# Patient Record
Sex: Female | Born: 1940 | Race: White | Hispanic: No | Marital: Married | Smoking: Former smoker
Health system: Southern US, Community
[De-identification: ages and names within clinical notes are randomized; demographics above are authoritative.]

## PROBLEM LIST (undated history)

## (undated) DIAGNOSIS — E785 Hyperlipidemia, unspecified: Secondary | ICD-10-CM

## (undated) DIAGNOSIS — J189 Pneumonia, unspecified organism: Secondary | ICD-10-CM

## (undated) DIAGNOSIS — I2699 Other pulmonary embolism without acute cor pulmonale: Secondary | ICD-10-CM

## (undated) DIAGNOSIS — M546 Pain in thoracic spine: Secondary | ICD-10-CM

## (undated) DIAGNOSIS — I739 Peripheral vascular disease, unspecified: Secondary | ICD-10-CM

## (undated) DIAGNOSIS — M479 Spondylosis, unspecified: Secondary | ICD-10-CM

## (undated) DIAGNOSIS — I10 Essential (primary) hypertension: Secondary | ICD-10-CM

## (undated) DIAGNOSIS — I679 Cerebrovascular disease, unspecified: Secondary | ICD-10-CM

## (undated) DIAGNOSIS — R42 Dizziness and giddiness: Secondary | ICD-10-CM

## (undated) DIAGNOSIS — R011 Cardiac murmur, unspecified: Secondary | ICD-10-CM

## (undated) DIAGNOSIS — I209 Angina pectoris, unspecified: Secondary | ICD-10-CM

## (undated) DIAGNOSIS — E039 Hypothyroidism, unspecified: Secondary | ICD-10-CM

## (undated) DIAGNOSIS — I499 Cardiac arrhythmia, unspecified: Secondary | ICD-10-CM

## (undated) DIAGNOSIS — Z0000 Encounter for general adult medical examination without abnormal findings: Secondary | ICD-10-CM

## (undated) DIAGNOSIS — I714 Abdominal aortic aneurysm, without rupture: Secondary | ICD-10-CM

## (undated) DIAGNOSIS — R001 Bradycardia, unspecified: Secondary | ICD-10-CM

## (undated) DIAGNOSIS — M7120 Synovial cyst of popliteal space [Baker], unspecified knee: Secondary | ICD-10-CM

## (undated) DIAGNOSIS — I200 Unstable angina: Secondary | ICD-10-CM

## (undated) DIAGNOSIS — G8929 Other chronic pain: Secondary | ICD-10-CM

## (undated) DIAGNOSIS — F32A Depression, unspecified: Secondary | ICD-10-CM

## (undated) DIAGNOSIS — J45909 Unspecified asthma, uncomplicated: Secondary | ICD-10-CM

## (undated) DIAGNOSIS — R430 Anosmia: Secondary | ICD-10-CM

## (undated) DIAGNOSIS — J209 Acute bronchitis, unspecified: Secondary | ICD-10-CM

## (undated) DIAGNOSIS — J449 Chronic obstructive pulmonary disease, unspecified: Secondary | ICD-10-CM

## (undated) DIAGNOSIS — R0602 Shortness of breath: Secondary | ICD-10-CM

## (undated) DIAGNOSIS — R739 Hyperglycemia, unspecified: Secondary | ICD-10-CM

## (undated) DIAGNOSIS — M5126 Other intervertebral disc displacement, lumbar region: Secondary | ICD-10-CM

## (undated) DIAGNOSIS — M47815 Spondylosis without myelopathy or radiculopathy, thoracolumbar region: Secondary | ICD-10-CM

## (undated) DIAGNOSIS — I2510 Atherosclerotic heart disease of native coronary artery without angina pectoris: Secondary | ICD-10-CM

## (undated) DIAGNOSIS — Z9289 Personal history of other medical treatment: Secondary | ICD-10-CM

## (undated) DIAGNOSIS — I214 Non-ST elevation (NSTEMI) myocardial infarction: Secondary | ICD-10-CM

## (undated) DIAGNOSIS — E079 Disorder of thyroid, unspecified: Secondary | ICD-10-CM

## (undated) DIAGNOSIS — C4491 Basal cell carcinoma of skin, unspecified: Secondary | ICD-10-CM

## (undated) DIAGNOSIS — F329 Major depressive disorder, single episode, unspecified: Secondary | ICD-10-CM

## (undated) DIAGNOSIS — K219 Gastro-esophageal reflux disease without esophagitis: Secondary | ICD-10-CM

## (undated) DIAGNOSIS — IMO0002 Reserved for concepts with insufficient information to code with codable children: Secondary | ICD-10-CM

## (undated) HISTORY — DX: Encounter for general adult medical examination without abnormal findings: Z00.00

## (undated) HISTORY — PX: BASAL CELL CARCINOMA EXCISION: SHX1214

## (undated) HISTORY — DX: Disorder of thyroid, unspecified: E07.9

## (undated) HISTORY — DX: Hyperlipidemia, unspecified: E78.5

## (undated) HISTORY — DX: Atherosclerotic heart disease of native coronary artery without angina pectoris: I25.10

## (undated) HISTORY — PX: DILATION AND CURETTAGE OF UTERUS: SHX78

## (undated) HISTORY — DX: Synovial cyst of popliteal space (Baker), unspecified knee: M71.20

## (undated) HISTORY — PX: FRACTURE SURGERY: SHX138

## (undated) HISTORY — DX: Major depressive disorder, single episode, unspecified: F32.9

## (undated) HISTORY — PX: NASAL SINUS SURGERY: SHX719

## (undated) HISTORY — DX: Essential (primary) hypertension: I10

## (undated) HISTORY — DX: Anosmia: R43.0

## (undated) HISTORY — DX: Acute bronchitis, unspecified: J20.9

## (undated) HISTORY — PX: FEMORAL ARTERY STENT: SHX1583

## (undated) HISTORY — DX: Hypothyroidism, unspecified: E03.9

## (undated) HISTORY — DX: Non-ST elevation (NSTEMI) myocardial infarction: I21.4

## (undated) HISTORY — DX: Peripheral vascular disease, unspecified: I73.9

## (undated) HISTORY — DX: Gastro-esophageal reflux disease without esophagitis: K21.9

## (undated) HISTORY — DX: Cerebrovascular disease, unspecified: I67.9

## (undated) HISTORY — DX: Depression, unspecified: F32.A

## (undated) HISTORY — DX: Chronic obstructive pulmonary disease, unspecified: J44.9

## (undated) HISTORY — PX: SQUAMOUS CELL CARCINOMA EXCISION: SHX2433

## (undated) HISTORY — PX: CATARACT EXTRACTION W/ INTRAOCULAR LENS  IMPLANT, BILATERAL: SHX1307

## (undated) HISTORY — PX: CARDIAC CATHETERIZATION: SHX172

## (undated) HISTORY — DX: Unstable angina: I20.0

## (undated) HISTORY — DX: Bradycardia, unspecified: R00.1

## (undated) HISTORY — DX: Unspecified asthma, uncomplicated: J45.909

## (undated) HISTORY — PX: ABDOMINAL AORTIC ANEURYSM REPAIR: SHX42

## (undated) HISTORY — DX: Dizziness and giddiness: R42

## (undated) HISTORY — PX: TUBAL LIGATION: SHX77

## (undated) HISTORY — DX: Hyperglycemia, unspecified: R73.9

## (undated) HISTORY — DX: Other intervertebral disc displacement, lumbar region: M51.26

## (undated) HISTORY — DX: Abdominal aortic aneurysm, without rupture: I71.4

---

## 1997-05-26 DIAGNOSIS — Z9289 Personal history of other medical treatment: Secondary | ICD-10-CM

## 1997-05-26 DIAGNOSIS — I2699 Other pulmonary embolism without acute cor pulmonale: Secondary | ICD-10-CM

## 1997-05-26 HISTORY — DX: Other pulmonary embolism without acute cor pulmonale: I26.99

## 1997-05-26 HISTORY — DX: Personal history of other medical treatment: Z92.89

## 1997-05-26 HISTORY — PX: CORONARY ARTERY BYPASS GRAFT: SHX141

## 2002-05-25 ENCOUNTER — Encounter: Payer: Self-pay | Admitting: Internal Medicine

## 2004-04-30 ENCOUNTER — Encounter: Admission: RE | Admit: 2004-04-30 | Discharge: 2004-04-30 | Payer: Self-pay | Admitting: Internal Medicine

## 2004-06-05 ENCOUNTER — Encounter: Admission: RE | Admit: 2004-06-05 | Discharge: 2004-06-05 | Payer: Self-pay | Admitting: Internal Medicine

## 2004-12-26 ENCOUNTER — Ambulatory Visit: Payer: Self-pay | Admitting: Cardiology

## 2005-01-06 ENCOUNTER — Ambulatory Visit: Payer: Self-pay

## 2005-04-22 ENCOUNTER — Ambulatory Visit: Payer: Self-pay | Admitting: Internal Medicine

## 2005-04-30 ENCOUNTER — Ambulatory Visit: Payer: Self-pay | Admitting: Family Medicine

## 2005-06-30 ENCOUNTER — Ambulatory Visit: Payer: Self-pay | Admitting: Internal Medicine

## 2005-06-30 ENCOUNTER — Ambulatory Visit: Payer: Self-pay | Admitting: Cardiology

## 2005-07-10 ENCOUNTER — Ambulatory Visit: Payer: Self-pay | Admitting: Pulmonary Disease

## 2005-07-11 ENCOUNTER — Ambulatory Visit (HOSPITAL_COMMUNITY): Admission: RE | Admit: 2005-07-11 | Discharge: 2005-07-11 | Payer: Self-pay | Admitting: Internal Medicine

## 2005-07-29 ENCOUNTER — Ambulatory Visit: Payer: Self-pay | Admitting: Internal Medicine

## 2005-09-01 ENCOUNTER — Ambulatory Visit: Payer: Self-pay | Admitting: Cardiology

## 2005-09-03 ENCOUNTER — Ambulatory Visit: Payer: Self-pay | Admitting: Internal Medicine

## 2005-09-12 ENCOUNTER — Ambulatory Visit: Payer: Self-pay | Admitting: Cardiology

## 2005-09-19 ENCOUNTER — Encounter: Admission: RE | Admit: 2005-09-19 | Discharge: 2005-09-19 | Payer: Self-pay | Admitting: Internal Medicine

## 2005-10-03 ENCOUNTER — Ambulatory Visit: Payer: Self-pay | Admitting: Internal Medicine

## 2005-12-04 ENCOUNTER — Ambulatory Visit: Payer: Self-pay | Admitting: Internal Medicine

## 2006-01-20 ENCOUNTER — Ambulatory Visit: Payer: Self-pay | Admitting: Internal Medicine

## 2006-03-04 ENCOUNTER — Ambulatory Visit: Payer: Self-pay | Admitting: Internal Medicine

## 2006-03-11 ENCOUNTER — Ambulatory Visit: Payer: Self-pay

## 2006-03-18 ENCOUNTER — Ambulatory Visit: Payer: Self-pay | Admitting: Cardiology

## 2006-03-20 ENCOUNTER — Ambulatory Visit: Payer: Self-pay | Admitting: Cardiology

## 2006-03-25 ENCOUNTER — Ambulatory Visit: Payer: Self-pay | Admitting: Cardiology

## 2006-04-02 ENCOUNTER — Ambulatory Visit: Payer: Self-pay | Admitting: Cardiology

## 2006-04-08 ENCOUNTER — Ambulatory Visit: Payer: Self-pay | Admitting: Cardiology

## 2006-04-30 ENCOUNTER — Ambulatory Visit: Payer: Self-pay | Admitting: Cardiology

## 2006-05-04 ENCOUNTER — Ambulatory Visit: Payer: Self-pay | Admitting: Cardiology

## 2006-05-12 ENCOUNTER — Ambulatory Visit: Payer: Self-pay | Admitting: Internal Medicine

## 2006-05-15 ENCOUNTER — Ambulatory Visit: Payer: Self-pay

## 2006-06-03 ENCOUNTER — Ambulatory Visit: Payer: Self-pay | Admitting: Cardiology

## 2006-06-03 ENCOUNTER — Ambulatory Visit: Payer: Self-pay | Admitting: Internal Medicine

## 2006-06-03 LAB — CONVERTED CEMR LAB
BUN: 15 mg/dL (ref 6–23)
CO2: 31 meq/L (ref 19–32)
Calcium: 9.9 mg/dL (ref 8.4–10.5)
Chloride: 100 meq/L (ref 96–112)
Creatinine, Ser: 1 mg/dL (ref 0.4–1.2)
GFR calc non Af Amer: 59 mL/min
Glomerular Filtration Rate, Af Am: 71 mL/min/{1.73_m2}
Glucose, Bld: 88 mg/dL (ref 70–99)
HCT: 41 % (ref 36.0–46.0)
Hemoglobin: 14.1 g/dL (ref 12.0–15.0)
INR: 1 (ref 0.9–2.0)
MCHC: 34.4 g/dL (ref 30.0–36.0)
MCV: 84.2 fL (ref 78.0–100.0)
Platelets: 188 10*3/uL (ref 150–400)
Potassium: 4.1 meq/L (ref 3.5–5.1)
Prothrombin Time: 12.3 s (ref 10.0–14.0)
RBC: 4.87 M/uL (ref 3.87–5.11)
RDW: 13.4 % (ref 11.5–14.6)
Sodium: 140 meq/L (ref 135–145)
WBC: 7.1 10*3/uL (ref 4.5–10.5)
aPTT: 33 s (ref 26.5–36.5)

## 2006-06-05 ENCOUNTER — Ambulatory Visit: Payer: Self-pay | Admitting: Cardiology

## 2006-06-11 ENCOUNTER — Ambulatory Visit: Payer: Self-pay | Admitting: Cardiology

## 2006-06-12 ENCOUNTER — Ambulatory Visit: Admission: RE | Admit: 2006-06-12 | Discharge: 2006-06-12 | Payer: Self-pay | Admitting: Cardiology

## 2006-06-25 ENCOUNTER — Ambulatory Visit: Payer: Self-pay

## 2006-06-25 ENCOUNTER — Ambulatory Visit: Payer: Self-pay | Admitting: Cardiology

## 2006-06-25 LAB — CONVERTED CEMR LAB
BUN: 12 mg/dL (ref 6–23)
Basophils Absolute: 0 10*3/uL (ref 0.0–0.1)
Basophils Relative: 0.6 % (ref 0.0–1.0)
CO2: 32 meq/L (ref 19–32)
Calcium: 9.3 mg/dL (ref 8.4–10.5)
Chloride: 99 meq/L (ref 96–112)
Creatinine, Ser: 0.8 mg/dL (ref 0.4–1.2)
Eosinophils Absolute: 0.2 10*3/uL (ref 0.0–0.6)
Eosinophils Relative: 3.5 % (ref 0.0–5.0)
GFR calc Af Amer: 92 mL/min
GFR calc non Af Amer: 76 mL/min
Glucose, Bld: 85 mg/dL (ref 70–99)
HCT: 40.6 % (ref 36.0–46.0)
Hemoglobin: 13.7 g/dL (ref 12.0–15.0)
INR: 0.9 (ref 0.9–2.0)
Lymphocytes Relative: 25.9 % (ref 12.0–46.0)
MCHC: 33.7 g/dL (ref 30.0–36.0)
MCV: 85 fL (ref 78.0–100.0)
Monocytes Absolute: 0.4 10*3/uL (ref 0.2–0.7)
Monocytes Relative: 5.5 % (ref 3.0–11.0)
Neutro Abs: 4.4 10*3/uL (ref 1.4–7.7)
Neutrophils Relative %: 64.5 % (ref 43.0–77.0)
Platelets: 243 10*3/uL (ref 150–400)
Potassium: 3.7 meq/L (ref 3.5–5.1)
Prothrombin Time: 11.8 s (ref 10.0–14.0)
RBC: 4.77 M/uL (ref 3.87–5.11)
RDW: 13.2 % (ref 11.5–14.6)
Sodium: 138 meq/L (ref 135–145)
WBC: 6.8 10*3/uL (ref 4.5–10.5)
aPTT: 30.3 s (ref 26.5–36.5)

## 2006-06-30 ENCOUNTER — Ambulatory Visit: Payer: Self-pay | Admitting: Cardiology

## 2006-06-30 ENCOUNTER — Ambulatory Visit (HOSPITAL_COMMUNITY): Admission: RE | Admit: 2006-06-30 | Discharge: 2006-06-30 | Payer: Self-pay | Admitting: Cardiology

## 2006-07-08 ENCOUNTER — Ambulatory Visit: Payer: Self-pay | Admitting: Cardiology

## 2006-07-08 ENCOUNTER — Ambulatory Visit: Payer: Self-pay

## 2006-09-10 ENCOUNTER — Ambulatory Visit: Payer: Self-pay | Admitting: Cardiology

## 2006-09-10 LAB — CONVERTED CEMR LAB
ALT: 19 units/L (ref 0–40)
AST: 20 units/L (ref 0–37)
Albumin: 4.2 g/dL (ref 3.5–5.2)
Alkaline Phosphatase: 88 units/L (ref 39–117)
BUN: 20 mg/dL (ref 6–23)
Bilirubin, Direct: 0.1 mg/dL (ref 0.0–0.3)
CO2: 32 meq/L (ref 19–32)
Calcium: 9.8 mg/dL (ref 8.4–10.5)
Chloride: 101 meq/L (ref 96–112)
Cholesterol: 184 mg/dL (ref 0–200)
Creatinine, Ser: 0.9 mg/dL (ref 0.4–1.2)
Direct LDL: 116.7 mg/dL
GFR calc Af Amer: 80 mL/min
GFR calc non Af Amer: 66 mL/min
Glucose, Bld: 95 mg/dL (ref 70–99)
HDL: 34.6 mg/dL — ABNORMAL LOW (ref >39.0)
LDL Cholesterol: 99 mg/dL (ref 0–99)
Potassium: 3.9 meq/L (ref 3.5–5.1)
Sodium: 141 meq/L (ref 135–145)
Total Bilirubin: 0.8 mg/dL (ref 0.3–1.2)
Total CHOL/HDL Ratio: 5.3
Total Protein: 7.4 g/dL (ref 6.0–8.3)
Triglycerides: 251 mg/dL (ref 0–149)
VLDL: 50 mg/dL — ABNORMAL HIGH (ref 0–40)

## 2006-09-16 ENCOUNTER — Ambulatory Visit: Payer: Self-pay | Admitting: Internal Medicine

## 2006-09-16 LAB — CONVERTED CEMR LAB
Hgb A1c MFr Bld: 5.9 % (ref 4.6–6.0)
TSH: 2.58 microintl units/mL (ref 0.35–5.50)

## 2006-11-18 ENCOUNTER — Ambulatory Visit: Payer: Self-pay | Admitting: Internal Medicine

## 2006-12-02 ENCOUNTER — Ambulatory Visit: Payer: Self-pay | Admitting: Cardiology

## 2006-12-22 ENCOUNTER — Encounter: Payer: Self-pay | Admitting: Internal Medicine

## 2006-12-22 DIAGNOSIS — I739 Peripheral vascular disease, unspecified: Secondary | ICD-10-CM | POA: Insufficient documentation

## 2006-12-22 DIAGNOSIS — I10 Essential (primary) hypertension: Secondary | ICD-10-CM | POA: Insufficient documentation

## 2006-12-22 DIAGNOSIS — I2510 Atherosclerotic heart disease of native coronary artery without angina pectoris: Secondary | ICD-10-CM | POA: Insufficient documentation

## 2006-12-22 DIAGNOSIS — M810 Age-related osteoporosis without current pathological fracture: Secondary | ICD-10-CM | POA: Insufficient documentation

## 2006-12-22 DIAGNOSIS — F339 Major depressive disorder, recurrent, unspecified: Secondary | ICD-10-CM | POA: Insufficient documentation

## 2006-12-22 DIAGNOSIS — J449 Chronic obstructive pulmonary disease, unspecified: Secondary | ICD-10-CM | POA: Insufficient documentation

## 2006-12-22 DIAGNOSIS — E785 Hyperlipidemia, unspecified: Secondary | ICD-10-CM | POA: Insufficient documentation

## 2006-12-22 DIAGNOSIS — K219 Gastro-esophageal reflux disease without esophagitis: Secondary | ICD-10-CM | POA: Insufficient documentation

## 2006-12-22 DIAGNOSIS — F329 Major depressive disorder, single episode, unspecified: Secondary | ICD-10-CM

## 2007-01-07 ENCOUNTER — Ambulatory Visit: Payer: Self-pay

## 2007-01-20 ENCOUNTER — Ambulatory Visit: Payer: Self-pay | Admitting: Cardiology

## 2007-01-28 ENCOUNTER — Ambulatory Visit: Payer: Self-pay

## 2007-05-31 ENCOUNTER — Telehealth: Payer: Self-pay | Admitting: Internal Medicine

## 2007-06-18 ENCOUNTER — Telehealth: Payer: Self-pay | Admitting: Internal Medicine

## 2007-07-06 ENCOUNTER — Ambulatory Visit: Payer: Self-pay | Admitting: Cardiology

## 2007-07-23 ENCOUNTER — Ambulatory Visit: Payer: Self-pay | Admitting: Cardiology

## 2007-07-23 ENCOUNTER — Ambulatory Visit: Payer: Self-pay

## 2007-07-23 LAB — CONVERTED CEMR LAB
AST: 16 units/L (ref 0–37)
Albumin: 3.5 g/dL (ref 3.5–5.2)
Bilirubin, Direct: 0.1 mg/dL (ref 0.0–0.3)
Cholesterol: 156 mg/dL (ref 0–200)
GFR calc Af Amer: 71 mL/min
GFR calc non Af Amer: 59 mL/min
Glucose, Bld: 96 mg/dL (ref 70–99)
HDL: 26.2 mg/dL — ABNORMAL LOW (ref >39.0)
LDL Cholesterol: 100 mg/dL — ABNORMAL HIGH (ref 0–99)
Potassium: 4.2 meq/L (ref 3.5–5.1)
Sodium: 141 meq/L (ref 135–145)
Total CHOL/HDL Ratio: 6
Triglycerides: 147 mg/dL (ref 0–149)
VLDL: 29 mg/dL (ref 0–40)

## 2007-08-04 ENCOUNTER — Ambulatory Visit: Payer: Self-pay | Admitting: Internal Medicine

## 2007-08-04 DIAGNOSIS — R5383 Other fatigue: Secondary | ICD-10-CM | POA: Insufficient documentation

## 2007-08-04 DIAGNOSIS — R5381 Other malaise: Secondary | ICD-10-CM

## 2007-09-23 ENCOUNTER — Encounter: Payer: Self-pay | Admitting: Internal Medicine

## 2007-09-23 ENCOUNTER — Encounter: Payer: Self-pay | Admitting: Cardiology

## 2007-09-23 ENCOUNTER — Ambulatory Visit: Payer: Self-pay

## 2007-09-23 LAB — CONVERTED CEMR LAB
ALT: 22 units/L (ref 0–35)
Albumin: 4.6 g/dL (ref 3.5–5.2)
CO2: 24 meq/L (ref 19–32)
Cholesterol: 141 mg/dL (ref 0–200)
Glucose, Bld: 105 mg/dL — ABNORMAL HIGH (ref 70–99)
LDL Cholesterol: 69 mg/dL (ref 0–99)
Potassium: 4.2 meq/L (ref 3.5–5.3)
Sodium: 142 meq/L (ref 135–145)
Total Bilirubin: 0.4 mg/dL (ref 0.3–1.2)
Total Protein: 7.5 g/dL (ref 6.0–8.3)
VLDL: 32 mg/dL (ref 0–40)

## 2007-10-05 ENCOUNTER — Ambulatory Visit: Payer: Self-pay | Admitting: Internal Medicine

## 2007-10-13 ENCOUNTER — Ambulatory Visit: Payer: Self-pay | Admitting: Internal Medicine

## 2007-10-25 ENCOUNTER — Telehealth: Payer: Self-pay | Admitting: Internal Medicine

## 2007-11-04 ENCOUNTER — Ambulatory Visit: Payer: Self-pay | Admitting: Internal Medicine

## 2007-11-04 DIAGNOSIS — S022XXA Fracture of nasal bones, initial encounter for closed fracture: Secondary | ICD-10-CM | POA: Insufficient documentation

## 2007-11-06 ENCOUNTER — Emergency Department (HOSPITAL_COMMUNITY): Admission: EM | Admit: 2007-11-06 | Discharge: 2007-11-06 | Payer: Self-pay | Admitting: Emergency Medicine

## 2007-11-09 ENCOUNTER — Encounter: Payer: Self-pay | Admitting: Internal Medicine

## 2007-11-24 ENCOUNTER — Ambulatory Visit (HOSPITAL_COMMUNITY): Admission: RE | Admit: 2007-11-24 | Discharge: 2007-11-24 | Payer: Self-pay | Admitting: Otolaryngology

## 2007-11-24 HISTORY — PX: CLOSED REDUCTION NASAL FRACTURE: SHX5365

## 2007-12-03 ENCOUNTER — Ambulatory Visit (HOSPITAL_COMMUNITY): Admission: RE | Admit: 2007-12-03 | Discharge: 2007-12-03 | Payer: Self-pay | Admitting: Neurosurgery

## 2007-12-15 ENCOUNTER — Ambulatory Visit: Payer: Self-pay | Admitting: Internal Medicine

## 2007-12-15 LAB — HM COLONOSCOPY

## 2008-03-15 ENCOUNTER — Ambulatory Visit: Payer: Self-pay | Admitting: Cardiology

## 2008-03-15 ENCOUNTER — Ambulatory Visit: Payer: Self-pay

## 2008-05-29 ENCOUNTER — Telehealth (INDEPENDENT_AMBULATORY_CARE_PROVIDER_SITE_OTHER): Payer: Self-pay | Admitting: *Deleted

## 2008-06-28 ENCOUNTER — Telehealth: Payer: Self-pay | Admitting: Internal Medicine

## 2008-07-04 ENCOUNTER — Telehealth: Payer: Self-pay | Admitting: Internal Medicine

## 2008-07-05 ENCOUNTER — Ambulatory Visit: Payer: Self-pay | Admitting: Internal Medicine

## 2008-07-10 ENCOUNTER — Ambulatory Visit: Payer: Self-pay | Admitting: Internal Medicine

## 2008-07-10 ENCOUNTER — Telehealth: Payer: Self-pay | Admitting: Internal Medicine

## 2008-07-10 ENCOUNTER — Ambulatory Visit (HOSPITAL_BASED_OUTPATIENT_CLINIC_OR_DEPARTMENT_OTHER): Admission: RE | Admit: 2008-07-10 | Discharge: 2008-07-10 | Payer: Self-pay | Admitting: Internal Medicine

## 2008-07-10 ENCOUNTER — Ambulatory Visit: Payer: Self-pay | Admitting: Radiology

## 2008-07-10 DIAGNOSIS — R0789 Other chest pain: Secondary | ICD-10-CM | POA: Insufficient documentation

## 2008-07-19 ENCOUNTER — Ambulatory Visit: Payer: Self-pay | Admitting: Cardiology

## 2008-07-19 ENCOUNTER — Telehealth: Payer: Self-pay | Admitting: Internal Medicine

## 2008-07-27 ENCOUNTER — Ambulatory Visit: Payer: Self-pay

## 2008-07-27 ENCOUNTER — Encounter: Payer: Self-pay | Admitting: Internal Medicine

## 2008-07-27 ENCOUNTER — Ambulatory Visit: Payer: Self-pay | Admitting: Cardiology

## 2008-07-27 LAB — CONVERTED CEMR LAB
BUN: 12 mg/dL (ref 6–23)
Calcium: 9.6 mg/dL (ref 8.4–10.5)
Creatinine, Ser: 0.8 mg/dL (ref 0.4–1.2)
GFR calc Af Amer: 91 mL/min
GFR calc non Af Amer: 76 mL/min

## 2008-08-14 ENCOUNTER — Encounter: Payer: Self-pay | Admitting: Cardiology

## 2008-08-14 ENCOUNTER — Ambulatory Visit: Payer: Self-pay | Admitting: Cardiology

## 2008-08-24 ENCOUNTER — Encounter: Payer: Self-pay | Admitting: Internal Medicine

## 2008-10-30 ENCOUNTER — Ambulatory Visit: Payer: Self-pay | Admitting: Internal Medicine

## 2008-10-30 ENCOUNTER — Ambulatory Visit (HOSPITAL_BASED_OUTPATIENT_CLINIC_OR_DEPARTMENT_OTHER): Admission: RE | Admit: 2008-10-30 | Discharge: 2008-10-30 | Payer: Self-pay | Admitting: Internal Medicine

## 2008-10-30 ENCOUNTER — Ambulatory Visit: Payer: Self-pay | Admitting: Diagnostic Radiology

## 2009-01-02 ENCOUNTER — Encounter (INDEPENDENT_AMBULATORY_CARE_PROVIDER_SITE_OTHER): Payer: Self-pay | Admitting: *Deleted

## 2009-01-22 ENCOUNTER — Encounter: Payer: Self-pay | Admitting: Family Medicine

## 2009-01-24 ENCOUNTER — Ambulatory Visit: Payer: Self-pay | Admitting: Family Medicine

## 2009-01-24 ENCOUNTER — Ambulatory Visit: Payer: Self-pay | Admitting: Internal Medicine

## 2009-01-24 ENCOUNTER — Encounter: Payer: Self-pay | Admitting: Family Medicine

## 2009-01-24 DIAGNOSIS — I714 Abdominal aortic aneurysm, without rupture, unspecified: Secondary | ICD-10-CM

## 2009-01-24 DIAGNOSIS — I701 Atherosclerosis of renal artery: Secondary | ICD-10-CM | POA: Insufficient documentation

## 2009-01-24 DIAGNOSIS — I679 Cerebrovascular disease, unspecified: Secondary | ICD-10-CM

## 2009-01-24 DIAGNOSIS — I6529 Occlusion and stenosis of unspecified carotid artery: Secondary | ICD-10-CM | POA: Insufficient documentation

## 2009-01-24 DIAGNOSIS — I519 Heart disease, unspecified: Secondary | ICD-10-CM | POA: Insufficient documentation

## 2009-01-24 DIAGNOSIS — I25709 Atherosclerosis of coronary artery bypass graft(s), unspecified, with unspecified angina pectoris: Secondary | ICD-10-CM | POA: Insufficient documentation

## 2009-01-24 DIAGNOSIS — I719 Aortic aneurysm of unspecified site, without rupture: Secondary | ICD-10-CM | POA: Insufficient documentation

## 2009-01-24 HISTORY — DX: Abdominal aortic aneurysm, without rupture, unspecified: I71.40

## 2009-01-24 HISTORY — DX: Cerebrovascular disease, unspecified: I67.9

## 2009-01-24 HISTORY — DX: Abdominal aortic aneurysm, without rupture: I71.4

## 2009-01-24 LAB — CONVERTED CEMR LAB
Alkaline Phosphatase: 60 units/L (ref 39–117)
Basophils Relative: 0.1 % (ref 0.0–3.0)
Bilirubin, Direct: 0.1 mg/dL (ref 0.0–0.3)
Calcium: 9.4 mg/dL (ref 8.4–10.5)
Creatinine, Ser: 0.9 mg/dL (ref 0.4–1.2)
Eosinophils Absolute: 0.6 10*3/uL (ref 0.0–0.7)
Eosinophils Relative: 8.7 % — ABNORMAL HIGH (ref 0.0–5.0)
GFR calc non Af Amer: 65.82 mL/min (ref >60)
Lymphocytes Relative: 27.2 % (ref 12.0–46.0)
MCHC: 33.7 g/dL (ref 30.0–36.0)
Neutrophils Relative %: 58 % (ref 43.0–77.0)
RBC: 4.81 M/uL (ref 3.87–5.11)
Total Protein: 7.1 g/dL (ref 6.0–8.3)
WBC: 6.8 10*3/uL (ref 4.5–10.5)

## 2009-01-25 ENCOUNTER — Ambulatory Visit: Payer: Self-pay | Admitting: Internal Medicine

## 2009-02-03 ENCOUNTER — Ambulatory Visit: Payer: Self-pay | Admitting: Family Medicine

## 2009-02-03 DIAGNOSIS — M25559 Pain in unspecified hip: Secondary | ICD-10-CM | POA: Insufficient documentation

## 2009-02-14 ENCOUNTER — Ambulatory Visit: Payer: Self-pay

## 2009-02-14 ENCOUNTER — Encounter: Payer: Self-pay | Admitting: Internal Medicine

## 2009-02-20 ENCOUNTER — Ambulatory Visit: Payer: Self-pay | Admitting: Family Medicine

## 2009-02-20 DIAGNOSIS — M461 Sacroiliitis, not elsewhere classified: Secondary | ICD-10-CM | POA: Insufficient documentation

## 2009-03-06 ENCOUNTER — Ambulatory Visit: Payer: Self-pay | Admitting: Internal Medicine

## 2009-03-20 ENCOUNTER — Ambulatory Visit: Payer: Self-pay | Admitting: Family Medicine

## 2009-03-20 LAB — CONVERTED CEMR LAB
Alkaline Phosphatase: 57 units/L (ref 39–117)
Bilirubin, Direct: 0 mg/dL (ref 0.0–0.3)
CO2: 31 meq/L (ref 19–32)
Calcium: 9 mg/dL (ref 8.4–10.5)
GFR calc non Af Amer: 65.79 mL/min (ref >60)
HDL: 34.2 mg/dL — ABNORMAL LOW (ref >39.00)
Sodium: 142 meq/L (ref 135–145)
Total Bilirubin: 0.5 mg/dL (ref 0.3–1.2)
Total CHOL/HDL Ratio: 5
VLDL: 24.4 mg/dL (ref 0.0–40.0)

## 2009-03-27 ENCOUNTER — Telehealth: Payer: Self-pay | Admitting: Family Medicine

## 2009-03-28 ENCOUNTER — Encounter: Payer: Self-pay | Admitting: Family Medicine

## 2009-04-03 ENCOUNTER — Ambulatory Visit: Payer: Self-pay | Admitting: Family Medicine

## 2009-04-03 DIAGNOSIS — Z85828 Personal history of other malignant neoplasm of skin: Secondary | ICD-10-CM | POA: Insufficient documentation

## 2009-04-09 ENCOUNTER — Telehealth (INDEPENDENT_AMBULATORY_CARE_PROVIDER_SITE_OTHER): Payer: Self-pay | Admitting: *Deleted

## 2009-04-11 ENCOUNTER — Encounter: Admission: RE | Admit: 2009-04-11 | Discharge: 2009-04-11 | Payer: Self-pay | Admitting: Family Medicine

## 2009-04-11 LAB — HM MAMMOGRAPHY: HM Mammogram: NORMAL

## 2009-05-07 LAB — CONVERTED CEMR LAB: Pap Smear: NORMAL

## 2009-07-23 ENCOUNTER — Ambulatory Visit: Payer: Self-pay | Admitting: Specialist

## 2009-08-08 ENCOUNTER — Telehealth: Payer: Self-pay | Admitting: Family Medicine

## 2009-09-05 ENCOUNTER — Ambulatory Visit: Payer: Self-pay | Admitting: Family Medicine

## 2009-09-06 LAB — CONVERTED CEMR LAB
Bilirubin, Direct: 0 mg/dL (ref 0.0–0.3)
HDL: 37.6 mg/dL — ABNORMAL LOW (ref >39.00)
Total Bilirubin: 0.4 mg/dL (ref 0.3–1.2)
Total CHOL/HDL Ratio: 8
VLDL: 52 mg/dL — ABNORMAL HIGH (ref 0.0–40.0)

## 2009-09-10 ENCOUNTER — Ambulatory Visit: Payer: Self-pay | Admitting: Internal Medicine

## 2009-09-10 DIAGNOSIS — R05 Cough: Secondary | ICD-10-CM

## 2009-09-10 DIAGNOSIS — R059 Cough, unspecified: Secondary | ICD-10-CM | POA: Insufficient documentation

## 2009-09-11 ENCOUNTER — Ambulatory Visit: Payer: Self-pay | Admitting: Internal Medicine

## 2009-09-17 LAB — CONVERTED CEMR LAB
Cholesterol: 268 mg/dL — ABNORMAL HIGH (ref 0–200)
Total CHOL/HDL Ratio: 7.7
Triglycerides: 260 mg/dL — ABNORMAL HIGH (ref <150)
VLDL: 52 mg/dL — ABNORMAL HIGH (ref 0–40)

## 2009-09-18 ENCOUNTER — Telehealth: Payer: Self-pay | Admitting: Internal Medicine

## 2009-10-03 ENCOUNTER — Telehealth: Payer: Self-pay | Admitting: Internal Medicine

## 2009-11-07 ENCOUNTER — Ambulatory Visit: Payer: Self-pay

## 2009-11-07 ENCOUNTER — Encounter: Payer: Self-pay | Admitting: Internal Medicine

## 2009-11-13 ENCOUNTER — Ambulatory Visit: Payer: Self-pay | Admitting: Family Medicine

## 2009-12-18 ENCOUNTER — Ambulatory Visit (HOSPITAL_BASED_OUTPATIENT_CLINIC_OR_DEPARTMENT_OTHER): Admission: RE | Admit: 2009-12-18 | Discharge: 2009-12-18 | Payer: Self-pay | Admitting: Internal Medicine

## 2009-12-18 ENCOUNTER — Ambulatory Visit: Payer: Self-pay | Admitting: Internal Medicine

## 2009-12-18 ENCOUNTER — Ambulatory Visit: Payer: Self-pay | Admitting: Radiology

## 2009-12-18 ENCOUNTER — Telehealth: Payer: Self-pay | Admitting: Internal Medicine

## 2010-02-12 ENCOUNTER — Ambulatory Visit: Payer: Self-pay | Admitting: Diagnostic Radiology

## 2010-02-12 ENCOUNTER — Telehealth: Payer: Self-pay | Admitting: Internal Medicine

## 2010-02-12 ENCOUNTER — Ambulatory Visit (HOSPITAL_BASED_OUTPATIENT_CLINIC_OR_DEPARTMENT_OTHER): Admission: RE | Admit: 2010-02-12 | Discharge: 2010-02-12 | Payer: Self-pay | Admitting: Internal Medicine

## 2010-02-12 ENCOUNTER — Ambulatory Visit: Payer: Self-pay | Admitting: Internal Medicine

## 2010-02-12 DIAGNOSIS — M25519 Pain in unspecified shoulder: Secondary | ICD-10-CM | POA: Insufficient documentation

## 2010-02-19 ENCOUNTER — Ambulatory Visit: Payer: Self-pay | Admitting: Internal Medicine

## 2010-02-25 ENCOUNTER — Encounter: Payer: Self-pay | Admitting: Internal Medicine

## 2010-02-26 ENCOUNTER — Encounter: Payer: Self-pay | Admitting: Cardiovascular Disease

## 2010-02-26 ENCOUNTER — Ambulatory Visit: Payer: Self-pay

## 2010-03-18 ENCOUNTER — Telehealth: Payer: Self-pay | Admitting: Internal Medicine

## 2010-03-20 ENCOUNTER — Ambulatory Visit: Payer: Self-pay | Admitting: Internal Medicine

## 2010-03-22 LAB — CONVERTED CEMR LAB
ALT: 12 units/L (ref 0–35)
AST: 18 units/L (ref 0–37)
Albumin: 4.2 g/dL (ref 3.5–5.2)
Alkaline Phosphatase: 68 units/L (ref 39–117)
BUN: 18 mg/dL (ref 6–23)
Calcium: 9.3 mg/dL (ref 8.4–10.5)
Chloride: 103 meq/L (ref 96–112)
HDL: 34 mg/dL — ABNORMAL LOW (ref >39)
LDL Cholesterol: 72 mg/dL (ref 0–99)
Potassium: 4 meq/L (ref 3.5–5.3)
Sodium: 141 meq/L (ref 135–145)
Total Protein: 6.8 g/dL (ref 6.0–8.3)

## 2010-03-25 ENCOUNTER — Ambulatory Visit: Payer: Self-pay | Admitting: Internal Medicine

## 2010-03-27 ENCOUNTER — Telehealth: Payer: Self-pay | Admitting: Internal Medicine

## 2010-04-01 ENCOUNTER — Ambulatory Visit: Payer: Self-pay | Admitting: Internal Medicine

## 2010-04-09 ENCOUNTER — Telehealth (INDEPENDENT_AMBULATORY_CARE_PROVIDER_SITE_OTHER): Payer: Self-pay | Admitting: Radiology

## 2010-04-10 ENCOUNTER — Telehealth: Payer: Self-pay | Admitting: Internal Medicine

## 2010-04-10 ENCOUNTER — Encounter: Payer: Self-pay | Admitting: Cardiovascular Disease

## 2010-04-10 ENCOUNTER — Encounter (HOSPITAL_COMMUNITY)
Admission: RE | Admit: 2010-04-10 | Discharge: 2010-05-25 | Payer: Self-pay | Source: Home / Self Care | Attending: Internal Medicine | Admitting: Internal Medicine

## 2010-04-10 ENCOUNTER — Ambulatory Visit: Payer: Self-pay | Admitting: Cardiovascular Disease

## 2010-04-10 ENCOUNTER — Ambulatory Visit: Payer: Self-pay

## 2010-04-17 ENCOUNTER — Telehealth: Payer: Self-pay | Admitting: Internal Medicine

## 2010-04-29 ENCOUNTER — Encounter: Payer: Self-pay | Admitting: Internal Medicine

## 2010-04-29 ENCOUNTER — Ambulatory Visit: Payer: Self-pay | Admitting: Surgery

## 2010-05-07 ENCOUNTER — Ambulatory Visit: Payer: Self-pay | Admitting: Internal Medicine

## 2010-05-07 DIAGNOSIS — J329 Chronic sinusitis, unspecified: Secondary | ICD-10-CM | POA: Insufficient documentation

## 2010-06-03 ENCOUNTER — Telehealth (INDEPENDENT_AMBULATORY_CARE_PROVIDER_SITE_OTHER): Payer: Self-pay | Admitting: *Deleted

## 2010-06-15 ENCOUNTER — Encounter: Payer: Self-pay | Admitting: Internal Medicine

## 2010-06-21 ENCOUNTER — Telehealth: Payer: Self-pay | Admitting: Internal Medicine

## 2010-06-23 LAB — CONVERTED CEMR LAB
ALT: 18 units/L (ref 0–35)
AST: 19 units/L (ref 0–37)
Albumin: 4 g/dL (ref 3.5–5.2)
Alkaline Phosphatase: 69 units/L (ref 39–117)
BUN: 16 mg/dL (ref 6–23)
Bilirubin, Direct: 0.1 mg/dL (ref 0.0–0.3)
CO2: 32 meq/L (ref 19–32)
Calcium: 9.3 mg/dL (ref 8.4–10.5)
Chloride: 107 meq/L (ref 96–112)
Cholesterol: 185 mg/dL (ref 0–200)
Creatinine, Ser: 0.9 mg/dL (ref 0.4–1.2)
GFR calc Af Amer: 80 mL/min
GFR calc non Af Amer: 66 mL/min
Glucose, Bld: 94 mg/dL (ref 70–99)
HDL: 31.5 mg/dL — ABNORMAL LOW (ref >39.0)
LDL Cholesterol: 122 mg/dL — ABNORMAL HIGH (ref 0–99)
Potassium: 3.9 meq/L (ref 3.5–5.1)
Sodium: 142 meq/L (ref 135–145)
TSH: 3.36 microintl units/mL (ref 0.35–5.50)
Total Bilirubin: 0.9 mg/dL (ref 0.3–1.2)
Total CHOL/HDL Ratio: 5.9
Total Protein: 7.4 g/dL (ref 6.0–8.3)
Triglycerides: 156 mg/dL — ABNORMAL HIGH (ref 0–149)
VLDL: 31 mg/dL (ref 0–40)
Vit D, 1,25-Dihydroxy: 30 (ref 30–89)

## 2010-06-25 NOTE — Progress Notes (Signed)
Summary: can we start with a lower dose and triate up- crestor/NO   Phone Note Refill Request    Method Requested: Fax to Mail Away Pharmacy Initial call taken by: Lorne Skeens,  September 18, 2009 9:25 AM Caller: [facility]- [phone]- janet  Request: Speak with Nurse Summary of Call: CRESTOR 40 MG. the only history they have for pt simvastatin 40 mg. can md start with a lower dosage triate up. ref# [id number] Initial call taken by: Lorne Skeens,  September 18, 2009 9:24 AM  Follow-up for Phone Call        Spoke with Lucretia Roers,  RX is as stands for Crestor 40mg  daily as ordered by MD for LDL of 181. Follow-up by: Charolotte Capuchin, RN,  September 18, 2009 9:37 AM

## 2010-06-25 NOTE — Assessment & Plan Note (Signed)
Summary: 1 month fu/dt   Vital Signs:  Patient profile:   70 year old Andrea [name] Height:      62 inches Weight:      147.25 pounds BMI:     27.03 O2 Sat:      95 % on Room air Temp:     97.9 degrees F oral Pulse rate:   57 / minute Pulse rhythm:   regular Resp:     16 per minute BP sitting:   120 / 80  (right arm) Cuff size:   large  Vitals Entered By: Andrea Andrea [name] CMA (March 25, 2010 8:30 AM)  O2 Flow:  Room air  CC: 1 Month Follow up Is Patient Diabetic? No   Primary Care Provider:  [name] DO  CC:  1 Month Follow up.  History of Present Illness:  Hypertension Follow-Up      This is a 70 year old woman who presents for Hypertension follow-up.  The patient denies lightheadedness and headaches.  The patient denies the following associated symptoms: chest pain.  Compliance with medications (by patient report) has been near 100%.  The patient reports that dietary compliance has been fair.     carotid doppler - left side is slightly worse 60-79% ( high end of range)  hyperlipidemia - LDL at goal.  good response to crestor   Preventive Screening-Counseling & Management  Alcohol-Tobacco     Smoking Status: quit  Allergies: 1)  ! Pcn 2)  ! Cipro 3)  ! Codeine 4)  ! Demerol 5)  ! Erythromycin 6)  ! * Shell Fish Mostly Shrimp  Past History:  Past Medical History: COPD Chest pain cerebrovascular disease      --carotid u/s 9/10: 0-39% R 60-79% L Coronary artery disease   --s/CABG  --Myoview normal 3/10  AAA  --AAA (2.8 x 3.0 in 9/10)  --moderate RAS Depression  Dizziness GERD Hyperlipidemia Hypertension Osteoporosis Peripheral vascular disease Renal artery stenosis (70% left renal artery)  AAA 2.7 x 2.7cm (07/11/05)   Past Surgical History: Coronary artery bypass graft Tubal ligation Abdominal aortic aneurysm repair   femoral artery stent     Family History: Significant for heart disease; mother had a myocardial infarction at age 44;  father has a history of lung and colon cancer, the colon cancer was diagnosed in his 54s; and brother has problems with hypertension and had a percutaneous transluminal coronary angioplasty with stent in the past.    sister had lung cancer       Social History: Retired, former Engineer, civil (consulting)  Married with 5 children Former Smoker - quit 12 yrs ago (1/2 ppd since age 55) Alcohol use-no        Physical Exam  General:  alert, well-developed, and well-nourished.   Neck:  supple, no masses, and no neck tenderness.  no carotid bruits.   Lungs:  normal respiratory effort and normal breath sounds.   Heart:  normal rate, regular rhythm, and no gallop.   Extremities:  No lower extremity edema   Impression & Recommendations:  Problem # 1:  HYPERTENSION (ICD-401.9) Assessment Improved Home BP readings occ elevated.  question false elevation with home cuff.   Maintain current medication regimen.  The following medications were removed from the medication list:    Micardis 40 Mg Tabs (Telmisartan) ..... One by mouth once daily    Microzide 12.5 Mg Caps (Hydrochlorothiazide) ..... Once a day Her updated medication list for this problem includes:    Micardis Hct 40-12.5  Mg Tabs (Telmisartan-hctz) ..... One by mouth once daily  BP today: 120/80 Prior BP: 140/80 (02/19/2010)  Labs Reviewed: K+: 4.0 (03/20/2010) Creat: : 0.88 (03/20/2010)   Chol: 152 (03/20/2010)   HDL: 34 (03/20/2010)   LDL: 72 (03/20/2010)   TG: 228 (03/20/2010)  Problem # 2:  HYPERLIPIDEMIA (ICD-272.4) Assessment: Unchanged  Her updated medication list for this problem includes:    Crestor 40 Mg Tabs (Rosuvastatin calcium) .Marland Kitchen... Take one tablet by mouth daily.  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  repeat carotid doppler planned in 6 months 60-79% ( high end of range) Her updated medication list for this problem includes:    Low-dose Aspirin 81 Mg Tabs (Aspirin) ..... By mouth once daily  Complete Medication List: 1)  Low-dose Aspirin 81 Mg Tabs (Aspirin) .... By mouth once daily 2)  Crestor 40 Mg Tabs (Rosuvastatin calcium) .... Take one tablet by mouth daily. 3)  Sertraline Hcl 100 Mg Tabs (Sertraline hcl) .... One by mouth once daily 4)  Calcium 1500 Mg Tabs (Calcium carbonate) .... By mouth two times a day 5)  Nexium 40 Mg Cpdr (Esomeprazole magnesium) .... One by mouth once daily 6)  Spiriva Handihaler 18 Mcg Caps (Tiotropium bromide monohydrate) .... Once daily as needed 7)  Fish Oil Oil (Fish oil) .... By mouth once daily 8)  Actonel 150 Mg Tabs (Risedronate sodium) .... One by mouth qmonthly 9)  Isosorbide Mononitrate Cr 30 Mg Xr24h-tab (Isosorbide mononitrate) .... One by mouth once daily 10)  Hydrocodone-acetaminophen 5-500 Mg Tabs (Hydrocodone-acetaminophen) .... One by mouth two times a day as needed for low back pain 11)  Voltaren 1 % Gel (Diclofenac sodium) .... Apply tid 12)  Micardis Hct 40-12.5 Mg Tabs (Telmisartan-hctz) .... One by mouth once daily  Patient Instructions: 1)  Please schedule a follow-up appointment in 6 months. 2)  BMP prior to visit, ICD-9:  401.9 3)  TSH prior to visit, ICD-9:  401.9 4)  Please return for lab work one (1) week before your next appointment.  Prescriptions: MICARDIS HCT 40-12.5 MG TABS (TELMISARTAN-HCTZ) one by mouth once daily  #90 x 3   Entered and Authorized by:   D. Thomos Lemons DO   Signed by:   D. Thomos Lemons DO on 03/25/2010   Method used:   Electronically to        [facility]* (retail)             ,          Ph: [phone]       Fax: [phone]   RxID:   [id number]    Orders Added: 1)  Est. Patient Level III [32440]    Current Allergies (reviewed  today): ! PCN ! CIPRO ! CODEINE ! DEMEROL ! ERYTHROMYCIN ! * SHELL FISH MOSTLY SHRIMP

## 2010-06-25 NOTE — Assessment & Plan Note (Signed)
Summary: painful red lump on shoulder/mhf   Vital Signs:  Patient profile:   70 year old female Height:      62 inches Weight:      146 pounds O2 Sat:      94 % on Room air Pulse rate:   65 / minute BP sitting:   120 / 70  (left arm) Cuff size:   regular  Vitals Entered By: Kathlene November (February 12, 2010 11:53 AM)  O2 Flow:  Room air CC: knot on left shoulder-present x 1 year- getting larger and painful   Primary Care Provider:  [name] DO  CC:  knot on left shoulder-present x 1 year- getting larger and painful.  History of Present Illness: 70 y/o female c/o bony prominence on left shoulder onset 1 yr recently causing discomfort / pain no injury or trauma  Current Medications (verified): 1)  Cozaar 100 Mg Tabs (Losartan Potassium) .Marland Kitchen.. 1 Tab Daily 2)  Low-Dose Aspirin 81 Mg  Tabs (Aspirin) .... By Mouth Once Daily 3)  Crestor 40 Mg Tabs (Rosuvastatin Calcium) .... Take One Tablet By Mouth Daily. 4)  Sertraline Hcl 100 Mg Tabs (Sertraline Hcl) .... One By Mouth Once Daily 5)  Calcium 1500 Mg  Tabs (Calcium Carbonate) .... By Mouth Two Times A Day 6)  Nexium 40 Mg Cpdr (Esomeprazole Magnesium) .... One By Mouth Once Daily 7)  Spiriva Handihaler 18 Mcg  Caps (Tiotropium Bromide Monohydrate) .... Once Daily As Needed 8)  Fish Oil   Oil (Fish Oil) .... By Mouth Once Daily 9)  Actonel 150 Mg Tabs (Risedronate Sodium) .... One By Mouth Qmonthly 10)  Imdur 30 Mg  Tb24 (Isosorbide Mononitrate) .... One By Mouth Qd 11)  Microzide 12.5 Mg Caps (Hydrochlorothiazide) .... Once A Day 12)  Hydrocodone-Acetaminophen 5-500 Mg Tabs (Hydrocodone-Acetaminophen) .... One By Mouth Two Times A Day As Needed For Low Back Pain  Allergies (verified): 1)  ! Pcn 2)  ! Cipro 3)  ! Codeine 4)  ! Demerol 5)  ! Erythromycin 6)  ! * Shell Fish Mostly Shrimp  Comments:  Nurse/Medical Assistant: The patient's medications and allergies were reviewed with the patient and were updated in the  Medication and Allergy Lists. Kathlene November (February 12, 2010 11:54 AM)  Past History:  Past Medical History: COPD Chest pain cerebrovascular disease       --carotid u/s 9/10: 0-39% R 60-79% L Coronary artery disease  --s/CABG  --Myoview normal 3/10  AAA  --AAA (2.8 x 3.0 in 9/10)  --moderate RAS Depression  Dizziness GERD Hyperlipidemia Hypertension Osteoporosis Peripheral vascular disease Renal artery stenosis (70% left renal artery) AAA 2.7 x 2.7cm (07/11/05)   Past Surgical History: Coronary artery bypass graft Tubal ligation Abdominal aortic aneurysm repair   femoral artery stent    Family History: Significant for heart disease; mother had a myocardial infarction at age 59; father has a history of lung and colon cancer, the colon cancer was diagnosed in his 15s; and brother has problems with hypertension and had a percutaneous transluminal coronary angioplasty with stent in the past.    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   Msk:  left AC joint is prominent and tender   Impression & Recommendations:  Problem #  1:  SHOULDER PAIN, LEFT (ICD-719.41) left shoulder pain likely due to [facility] joint arthritis.  reassurred pt.  use sample of voltaren gel  Her updated medication list for this problem includes:    Low-dose Aspirin 81 Mg Tabs (Aspirin) ..... By mouth once daily    Hydrocodone-acetaminophen 5-500 Mg Tabs (Hydrocodone-acetaminophen) ..... One by mouth two times a day as needed for low back pain  Orders: T-Shoulder Left Min 2 Views (73030TC)  Problem # 2:  HYPERTENSION (ICD-401.9)  Her updated medication list for this problem includes:    Cozaar 100 Mg Tabs (Losartan potassium) .Marland Kitchen... 1/2 po  tab two times a day    Microzide 12.5 Mg Caps  (Hydrochlorothiazide) ..... Once a day  BP today: 120/70 Prior BP: 136/80 (12/18/2009)  Labs Reviewed: K+: 3.8 (03/20/2009) Creat: : 0.9 (03/20/2009)   Chol: 268 (09/11/2009)   HDL: 35 (09/11/2009)   LDL: 181 (09/11/2009)   TG: 260 (09/11/2009)  Complete Medication List: 1)  Cozaar 100 Mg Tabs (Losartan potassium) .... 1/2 po  tab two times a day 2)  Low-dose Aspirin 81 Mg Tabs (Aspirin) .... By mouth once daily 3)  Crestor 40 Mg Tabs (Rosuvastatin calcium) .... Take one tablet by mouth daily. 4)  Sertraline Hcl 100 Mg Tabs (Sertraline hcl) .... One by mouth once daily 5)  Calcium 1500 Mg Tabs (Calcium carbonate) .... By mouth two times a day 6)  Nexium 40 Mg Cpdr (Esomeprazole magnesium) .... One by mouth once daily 7)  Spiriva Handihaler 18 Mcg Caps (Tiotropium bromide monohydrate) .... Once daily as needed 8)  Fish Oil Oil (Fish oil) .... By mouth once daily 9)  Actonel 150 Mg Tabs (Risedronate sodium) .... One by mouth qmonthly 10)  Imdur 30 Mg Tb24 (isosorbide Mononitrate)  .... One by mouth qd 11)  Microzide 12.5 Mg Caps (Hydrochlorothiazide) .... Once a day 12)  Hydrocodone-acetaminophen 5-500 Mg Tabs (Hydrocodone-acetaminophen) .... One by mouth two times a day as needed for low back pain 13)  Voltaren 1 % Gel (Diclofenac sodium) .... Apply tid  Patient Instructions: 1)  Call our office if your symptoms do not  improve or gets worse.

## 2010-06-25 NOTE — Progress Notes (Signed)
Summary: Xray Results  Phone Note Outgoing Call   Summary of Call: call pt - no acute findings on shoulder xray Initial call taken by: D. Thomos Lemons DO,  February 12, 2010 5:19 PM  Follow-up for Phone Call        call placed to patient at [phone], she has been advised per Dr Artist Pais instructions Follow-up by: Glendell Docker CMA,  February 13, 2010 12:28 PM

## 2010-06-25 NOTE — Progress Notes (Signed)
Summary: Medication Refills  Phone Note Refill Request Call back at Home Phone [phone] Message from:  Patient on March 27, 2010 2:03 PM  Refills Requested: Medication #1:  SERTRALINE HCL 100 MG TABS one by mouth once daily  Medication #2:  NEXIUM 40 MG CPDR one by mouth once daily Patient called and left voice message for refills to [facility] for sertraline and nexium   Method Requested: Electronic Initial call taken by: Glendell Docker CMA,  March 27, 2010 2:04 PM    Prescriptions: NEXIUM 40 MG CPDR (ESOMEPRAZOLE MAGNESIUM) one by mouth once daily  #90 x 3   Entered by:   Glendell Docker CMA   Authorized by:   D. Thomos Lemons DO   Signed by:   Glendell Docker CMA on 03/27/2010   Method used:   Electronically to        [facility]* (retail)             ,          Ph: [phone]       Fax: [phone]   RxID:   [id number] SERTRALINE HCL 100 MG TABS (SERTRALINE HCL) one by mouth once daily  #90 x 1   Entered by:   Glendell Docker CMA   Authorized by:   D. Thomos Lemons DO   Signed by:   Glendell Docker CMA on 03/27/2010   Method used:   Electronically to        [facility]* (retail)             ,          Ph: [phone]       Fax: [phone]   RxID:   [id number]

## 2010-06-25 NOTE — Assessment & Plan Note (Signed)
Summary: follow up/mhf   Vital Signs:  Patient profile:   70 year old female Weight:      142.50 pounds BMI:     26.16 O2 Sat:      97 % on Room air Temp:     98.1 degrees F oral Pulse rate:   57 / minute Pulse rhythm:   regular Resp:     16 per minute BP sitting:   136 / 80  (right arm) Cuff size:   regular  Vitals Entered By: Glendell Docker CMA (December 18, 2009 10:08 AM)  O2 Flow:  Room air CC: Rm 3- Depression & Cough Is Patient Diabetic? No Pain Assessment Patient in pain? yes     Location: lower back Intensity: 6 Type: aching Onset of pain  Constant Comments c/o dry cough for the past month, Discuss depression, lower back pain for the past 2 days   Primary Care Provider:  Hannah Beat MD  CC:  Rm 3- Depression & Cough.  History of Present Illness: 70 y/o white female for f/u increased depressive symptoms 5 children estranged from multiple family members  chronic intermittent cough better after changing to ACE and sinus surgery but not resolved no change in wt or appetite occ right sided chest pains.   Preventive Screening-Counseling & Management  Alcohol-Tobacco     Smoking Status: quit  Allergies: 1)  ! Pcn 2)  ! Cipro 3)  ! Codeine 4)  ! Demerol 5)  ! Erythromycin 6)  ! * Shell Fish Mostly Shrimp  Past History:  Past Medical History: COPD Chest pain cerebrovascular disease      --carotid u/s 9/10: 0-39% R 60-79% L Coronary artery disease  --s/CABG  --Myoview normal 3/10  AAA  --AAA (2.8 x 3.0 in 9/10)  --moderate RAS Depression  Dizziness GERD Hyperlipidemia Hypertension Osteoporosis Peripheral vascular disease Renal artery stenosis (70% left renal artery) AAA 2.7 x 2.7cm (07/11/05)   Past Surgical History: Coronary artery bypass graft Tubal ligation Abdominal aortic aneurysm repair   femoral artery stent   Family History: Significant for heart disease; mother had a myocardial infarction at age 62; father has a history  of lung and colon cancer, the colon cancer was diagnosed in his 86s; and brother has problems with hypertension and had a percutaneous transluminal coronary angioplasty with stent in the past.    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  The patient denies severe indigestion/heartburn.    Physical Exam  General:  alert, well-developed, and well-nourished.   Ears:  R ear normal and L ear normal.   Nose:  mild mucosal edema.  dried secretions Neck:  supple, no masses, and no neck tenderness.   Lungs:  normal respiratory effort, normal breath sounds, no crackles, and no wheezes.   Heart:  normal rate, regular rhythm, and no gallop.   Abdomen:  soft, non-tender, and normal bowel sounds.   Extremities:  No lower extremity edema Neurologic:  cranial nerves II-XII intact and gait normal.   Psych:  good eye contact and tearful.     Impression & Recommendations:  Problem # 1:  COUGH (ICD-786.2) prev smoker with chronic intermittent cough.  improved after stopping ACE but not resolved. rule out pulm malignancy  Orders: CT without Contrast (CT w/o contrast)  Problem # 2:  HYPERTENSION (ICD-401.9)  Her updated medication list for this problem includes:    Cozaar 100 Mg Tabs (Losartan potassium) .Marland Kitchen... 1 tab daily    Microzide 12.5 Mg Caps (Hydrochlorothiazide) ..... Once a day  BP today: 136/80 Prior BP: 130/54 (09/10/2009)  Labs Reviewed: K+: 3.8 (03/20/2009) Creat: : 0.9 (03/20/2009)   Chol: 268 (09/11/2009)   HDL: 35 (09/11/2009)   LDL: 181 (09/11/2009)   TG: 260 (09/11/2009)  Problem # 3:  DEPRESSION (ICD-311) Assessment: Deteriorated worse due to family stress.  change to sertraline.  Patient advised to call office if symptoms worsen.   Her updated medication list for this problem  includes:    Sertraline Hcl 100 Mg Tabs (Sertraline hcl) ..... One by mouth once daily  Complete Medication List: 1)  Cozaar 100 Mg Tabs (Losartan potassium) .Marland Kitchen.. 1 tab daily 2)  Low-dose Aspirin 81 Mg Tabs (Aspirin) .... By mouth once daily 3)  Crestor 40 Mg Tabs (Rosuvastatin calcium) .... Take one tablet by mouth daily. 4)  Sertraline Hcl 100 Mg Tabs (Sertraline hcl) .... One by mouth once daily 5)  Calcium 1500 Mg Tabs (Calcium carbonate) .... By mouth two times a day 6)  Nexium 40 Mg Cpdr (Esomeprazole magnesium) .... One by mouth once daily 7)  Spiriva Handihaler 18 Mcg Caps (Tiotropium bromide monohydrate) .... Once daily as needed 8)  Fish Oil Oil (Fish oil) .... By mouth once daily 9)  Actonel 150 Mg Tabs (Risedronate sodium) .... One by mouth qmonthly 10)  Imdur 30 Mg Tb24 (isosorbide Mononitrate)  .... One by mouth qd 11)  Microzide 12.5 Mg Caps (Hydrochlorothiazide) .... Once a day 12)  Hydrocodone-acetaminophen 5-500 Mg Tabs (Hydrocodone-acetaminophen) .... One by mouth two times a day as needed for low back pain  Patient Instructions: 1)  Please schedule a follow-up appointment in 2 months. 2)  Call our office if your depressive symptoms get worse. 3)  The highlighted prescriptions were electronically sent to your pharmacy 4)  Monitor your blood pressure at home Prescriptions: HYDROCODONE-ACETAMINOPHEN 5-500 MG TABS (HYDROCODONE-ACETAMINOPHEN) one by mouth two times a day as needed for low back pain  #30 x 1   Entered and Authorized by:   D. Thomos Lemons DO   Signed by:   D. Thomos Lemons DO on 12/18/2009   Method used:   Print then Give to Patient   RxID:   [id number] SERTRALINE HCL 100 MG TABS (SERTRALINE HCL) one by mouth once daily  #30 x 2   Entered and Authorized by:   D. Thomos Lemons DO   Signed by:   D. Thomos Lemons DO on 12/18/2009   Method used:   Electronically to        [facility] ([facility]       [address]       [place], [place]  [address]       Ph: [phone]       Fax: [phone]   RxID:   [id number] NEXIUM 40 MG CPDR (ESOMEPRAZOLE MAGNESIUM) one by mouth once daily  #30 x 5   Entered and Authorized by:   D. Thomos Lemons DO   Signed by:   D. Thomos Lemons DO on 12/18/2009   Method used:   Electronically to        [facility]retail[facility]       [address]  [place], [place]  [address]       Ph: [phone]       Fax: [phone]   RxID:   [id number]     Current Allergies (reviewed today): ! PCN ! CIPRO ! CODEINE ! DEMEROL ! ERYTHROMYCIN ! * SHELL FISH MOSTLY SHRIMP

## 2010-06-25 NOTE — Assessment & Plan Note (Signed)
Summary: F6M/AMD      Allergies Added:   Visit Type:  Follow-up Referring Provider:  Gertie Baron Primary Provider:  [name] DO  CC:  c/o shortness of breath and two episodes of chest pain.Marland Kitchen  History of Present Illness: Ms. Aerts is a pleasant 70 y/o female who has a history of coronary artery disease status post coronary artery bypassing grafting '99, small abdominal aneurysm, cerebrovascular disease, renal artery stenosis s/p , and peripheral vascular disease.  Had a Myoview on July 27, 2008, that showed no sign of scar ischemia and her ejection fraction was 72%.  She also had a followup abdominal ultrasound that showed a stable smal  infrarenal abdominal aortic aneurysm and stable 1-59% bilateral renal artery stenoses.   Here for routine f/u. Occasional CP. Last Wednesday had episode that lasted about 20 minutes associated with diaphoresis. The next day had a bout a 5 min episode. Nothing significant since. Feels she is a little more dyspneic. Remains active with nursing and taking care of her 30 y/o grandson.   At last visit we started Crestor and she is tolerating well. No focal neuro signs or symptoms.  Recent studies:  Carotids 10/11: RICA 40-59% LICA 60-79% (upper range and increased from previous)  Ab u/s 6/11: 2.9 x 2.8 (stable) Renals 6/11: 1-59% Bilat  Recent lipids TC 152 TG 228 HDL 34 LDL 72 (down from 181)  Current Medications (verified): 1)  Low-Dose Aspirin 81 Mg  Tabs (Aspirin) .... By Mouth Once Daily 2)  Crestor 40 Mg Tabs (Rosuvastatin Calcium) .... Take One Tablet By Mouth Daily. 3)  Sertraline Hcl 100 Mg Tabs (Sertraline Hcl) .... One By Mouth Once Daily 4)  Calcium 1500 Mg  Tabs (Calcium Carbonate) .... By Mouth Two Times A Day 5)  Nexium 40 Mg Cpdr (Esomeprazole Magnesium) .... One By Mouth Once Daily 6)  Spiriva Handihaler 18 Mcg  Caps (Tiotropium Bromide Monohydrate) .... Once Daily As Needed 7)  Fish Oil   Oil (Fish Oil) .... By Mouth Once  Daily 8)  Actonel 150 Mg Tabs (Risedronate Sodium) .... One By Mouth Qmonthly 9)  Isosorbide Mononitrate Cr 30 Mg Xr24h-Tab (Isosorbide Mononitrate) .... One By Mouth Once Daily 10)  Hydrocodone-Acetaminophen 5-500 Mg Tabs (Hydrocodone-Acetaminophen) .... One By Mouth Two Times A Day As Needed For Low Back Pain 11)  Voltaren 1 % Gel (Diclofenac Sodium) .... Apply Tid 12)  Micardis Hct 40-12.5 Mg Tabs (Telmisartan-Hctz) .... One By Mouth Once Daily  Allergies (verified): 1)  ! Pcn 2)  ! Cipro 3)  ! Codeine 4)  ! Demerol 5)  ! Erythromycin 6)  ! * Shell Fish Mostly Shrimp  Past History:  Past Medical History: Last updated: 03/25/2010 COPD Chest pain cerebrovascular disease      --carotid u/s 9/10: 0-39% R 60-79% L Coronary artery disease   --s/CABG  --Myoview normal 3/10  AAA  --AAA (2.8 x 3.0 in 9/10)  --moderate RAS Depression  Dizziness GERD Hyperlipidemia Hypertension Osteoporosis Peripheral vascular disease Renal artery stenosis (70% left renal artery)  AAA 2.7 x 2.7cm (07/11/05)   Past Surgical History: Last updated: 03/25/2010 Coronary artery bypass graft Tubal ligation Abdominal aortic aneurysm repair   femoral artery stent     Family History: Last updated: 03/25/2010 Significant for heart disease; mother had a myocardial infarction at age 70; father has a history of lung and colon cancer, the colon cancer was diagnosed in his 39s; and brother has problems with hypertension and had a percutaneous transluminal  coronary angioplasty with stent in the past.    sister had lung cancer       Social History: Last updated: 03/25/2010 Retired, former Engineer, civil (consulting)  Married with 5 children Former Smoker - quit 12 yrs ago (1/2 ppd since age 28) Alcohol use-no        Risk Factors: Caffeine Use: 2 (07/10/2008) Exercise: yes (07/10/2008)  Risk Factors: Smoking Status: quit (03/25/2010)  Review of Systems       As per HPI and past medical history; otherwise all  systems negative.   Vital Signs:  Patient profile:   70 year old female Height:      62 inches Weight:      149 pounds BMI:     27.35 Pulse rate:   62 / minute BP sitting:   100 / 72  (left arm) Cuff size:   regular  Vitals Entered By: Bishop Dublin, CMA (April 01, 2010 1:59 PM)  Physical Exam  General:  Well appearing. no resp difficulty HEENT: normal Neck: supple. no JVD. Carotids 2+ bilat; bilat bruits R>L. No lymphadenopathy or thryomegaly appreciated. Cor: PMI nondisplaced. Regular rate & rhythm. No rubs, gallops. soft systolicmurmur. Lungs: clear Abdomen: soft, nontender, nondistended. No hepatosplenomegaly. No bruits or masses. Good bowel sounds. Extremities: no cyanosis, clubbing, rash, edema Neuro: alert & orientedx3, cranial nerves grossly intact. moves all 4 extremities w/o difficulty. affect pleasant    Impression & Recommendations:  Problem # 1:  CHEST TIGHTNESS-PRESSURE-OTHER (ICD-786.59) Discussed cath vs stress test. As symptoms have abated will plan Lexiscan myoview (unable to walk far enough on treadmill due to PAD).   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 Will refer to Dr. Myra Gianotti in vascular surgery to further assess. Suspect may not be time to intervene yet but will get her plugged in.   Problem # 3:  HYPERLIPIDEMIA (ICD-272.4) Much improved. LDL at goal. Continue Crestor.   Problem # 4:  AORTIC ANEUR UNSPEC SITE WITHOUT MENTION RUPTURE (ICD-441.9) Stable. Continue year u/s.   Other Orders: EKG w/ Interpretation (93000) Nuclear Stress Test (Nuc Stress Test)   Patient Instructions: 1)  Your physician recommends that you schedule a follow-up appointment in: 3 months 2)  Your physician has requested that you have a Lexiscan myoview.  For further information please visit [email or web address]  Please follow instruction sheet, as given. 3)  You have been referred to Vein and vascular specialist  for evaluation of Left Carotid artery.

## 2010-06-25 NOTE — Assessment & Plan Note (Signed)
Summary: Cardiology Nuclear Testing  Nuclear Med Background Indications for Stress Test: Evaluation for Ischemia, Graft Patency   History: CABG, COPD, Heart Catheterization, Myocardial Perfusion Study  History Comments: 3/10 MPI:no scar or ischemia, EF=72%, AAA (2.9x2.8)  Symptoms: Chest Tightness, Diaphoresis, DOE, Fatigue, Rapid HR, SOB  Symptoms Comments: Last episode of VH:QIONG, under camera, 5-6/10; none now.   Nuclear Pre-Procedure Cardiac Risk Factors: Carotid Disease, Family History - CAD, History of Smoking, Hypertension, Lipids, PVD Caffeine/Decaff Intake: None NPO After: 8:00 PM Lungs: Clear.  O2 Sat 95% on RA. IV 0.9% NS with Angio Cath: 20g     IV Site: R Antecubital IV Started by: Irean Hong, RN Chest Size (in) 36     Cup Size D     Height (in): 62 Weight (lb): 145 BMI: 26.62  Nuclear Med Study 1 or 2 day study:  1 day     Stress Test Type:  Treadmill/Lexiscan Reading MD:  Charlton Haws, MD     Referring MD:  Arvilla Meres, MD Resting Radionuclide:  Technetium 82m Tetrofosmin     Resting Radionuclide Dose:  11 mCi  Stress Radionuclide:  Technetium 13m Tetrofosmin     Stress Radionuclide Dose:  33 mCi   Stress Protocol Exercise Time (min):  2:00 min     Max HR:  121 bpm     Predicted Max HR:  149 bpm  Max Systolic BP: 178 mm Hg     Percent Max HR:  81.21 %Rate Pressure Product:  29528  Lexiscan: 0.4 mg   Stress Test Technologist:  Rea College, CMA-N     Nuclear Technologist:  Domenic Polite, CNMT  Rest Procedure  Myocardial perfusion imaging was performed at rest 45 minutes following the intravenous administration of Technetium 71m Tetrofosmin.  Stress Procedure  The patient received IV Lexiscan 0.4 mg over 15-seconds with concurrent low level exercise and then Technetium 60m Tetrofosmin was injected at 30-seconds.  There were nonspecific ST-T wave changes and occasional PVC's with infusion.  Quantitative spect images were obtained after a 45 minute  delay.  QPS Raw Data Images:  Normal; no motion artifact; normal heart/lung ratio. Stress Images:  Normal homogeneous uptake in all areas of the myocardium. Rest Images:  Normal homogeneous uptake in all areas of the myocardium. Subtraction (SDS):  Normal Transient Ischemic Dilatation:  1.12  (Normal <1.22)  Lung/Heart Ratio:  0.11  (Normal <0.45)  Quantitative Gated Spect Images QGS EDV:  53 ml QGS ESV:  13 ml QGS EF:  76 % QGS cine images:  normal  Findings Normal nuclear study      Overall Impression  Exercise Capacity: Lexiscan with no exercise. BP Response: Normal blood pressure response. Clinical Symptoms: Dyspnea and Lightheaded ECG Impression: nonspecific ST/T wave changes Overall Impression: Normal stress nuclear study.  Appended Document: Cardiology Nuclear Testing ok  Appended Document: Cardiology Nuclear Testing PT AWARE./CY

## 2010-06-25 NOTE — Assessment & Plan Note (Signed)
Summary: F6M/AMD   Referring Provider:  Gertie Baron Primary Provider:  Hannah Beat MD  CC:  ROV; C/O Chest pressure, SOB, and Fatigue.  History of Present Illness: Andrea [name] is a pleasant 70 y/o female who has a history of coronary artery disease status post coronary artery bypassing graft '99, small abdominal aneurysm, cerebrovascular disease, renal artery stenosis, and peripheral vascular disease.  Had a Myoview on July 27, 2008, that showed no sign of scar ischemia and her ejection fraction was 72%.  She also had a followup abdominal ultrasound that showed a stable smal  infrarenal abdominal aortic aneurysm and stable 1-59% bilateral renal artery stenoses.   Recently had sinus surgery c/b post-op pseudomonas infection.   Here for routine f/u. Has mild SOB when walking a long way which she attributes to COPD. Occasional chest pressure; possibly 1x/month. Hit and miss. Not related to exertion. Lasts 5 minutes at most. No associated signs. Perhaps a little worse but not signifcantly so - she feels it is pretty stable.   Having severe dry-hacking cough. Can cause stress incontinence. Feels it may be related to her sinus infection but Dr. Chestine Spore worried about ACE-I.  Recent lipids TC 301 (up from 154) TG 260 LDL 214 HDL 37. Says she is compliant with simva and ni signficant change in diet.   Current Medications (verified): 1)  Quinapril Hcl 20 Mg  Tabs (Quinapril Hcl) .... By Mouth Two Times A Day 2)  Low-Dose Aspirin 81 Mg  Tabs (Aspirin) .... By Mouth Once Daily 3)  Simvastatin 40 Mg Tabs (Simvastatin) .... Take One By Mouth Daily 4)  Citalopram Hydrobromide 40 Mg  Tabs (Citalopram Hydrobromide) .... One By Mouth Once Daily 5)  Calcium 1500 Mg  Tabs (Calcium Carbonate) .... By Mouth Two Times A Day 6)  Omeprazole 20 Mg Cpdr (Omeprazole) .Marland Kitchen.. 1 By Mouth Daily 30 Minutes Before Breakfast 7)  Spiriva Handihaler 18 Mcg  Caps (Tiotropium Bromide Monohydrate) .... Once Daily As Needed 8)   Fish Oil   Oil (Fish Oil) .... By Mouth Once Daily 9)  Actonel 150 Mg Tabs (Risedronate Sodium) .... One By Mouth Qmonthly 10)  Imdur 30 Mg  Tb24 (Isosorbide Mononitrate) .... One By Mouth Qd 11)  Microzide 12.5 Mg Caps (Hydrochlorothiazide) .... Once A Day 12)  Vicodin Es 7.5-750 Mg Tabs (Hydrocodone-Acetaminophen) .Marland Kitchen.. 1 By Mouth Every 6 Hours As Needed  Allergies: 1)  ! Pcn 2)  ! Cipro 3)  ! Codeine 4)  ! Demerol 5)  ! Erythromycin 6)  ! * Shell Fish Mostly Shrimp  Past History:  Past Medical History: COPD Chest pain cerebrovascular disease     --carotid u/s 9/10: 0-39% R 60-79% L Coronary artery disease  --s/CABG  --Myoview normal 3/10 AAA  --AAA (2.8 x 3.0 in 9/10)  --moderate RAS Depression  Dizziness GERD Hyperlipidemia Hypertension Osteoporosis Peripheral vascular disease Renal artery stenosis (70% left renal artery) AAA 2.7 x 2.7cm (07/11/05)   Vital Signs:  Patient profile:   70 year old female Height:      62 inches Weight:      142 pounds BMI:     26.07 Pulse rate:   52 / minute BP sitting:   130 / 54  (left arm)  Vitals Entered By: Andrea [name], EMT-P (September 10, 2009 11:18 AM)   Impression & Recommendations:  Problem # 1:  CORONARY ARTERY DISEASE (ICD-414.00) Stable. Does have occasional CP but I am not convinced it is cardiac. Recent Myoview reassuring. If progresses  will need further evaluation.   Problem # 2:  COUGH (ICD-786.2) May be ACE-I. Will switch quinapril to cozaar 100 once daily.   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 Asymptomatic. Do fo f/u in 6 months.  Problem # 4:  AORTIC ANEUR UNSPEC SITE WITHOUT MENTION RUPTURE (ICD-441.9) Small. Will f/u in 6 months.  Problem # 5:  HYPERLIPIDEMIA (ICD-272.4) Lipids much worse. Will repeat panel tomorrow. If still up will switch to crestor 40. Needs dietary modification as well with goal LDL < 70.   Patient Instructions: 1)  Your physician recommends that you  schedule a follow-up appointment in: 6 months 2)  Your physician recommends that you return for a FASTING lipid profile: tomorrow (lipids/cmet) 3)  Your physician has recommended you make the following change in your medication: stop quinapril, start cozaar 100 mg daily Prescriptions: COZAAR 100 MG TABS (LOSARTAN POTASSIUM) 1 tab daily  #30 x 0   Entered by:   Charlena Cross, RN, BSN   Authorized by:   Dolores Patty, MD, [facility]   Signed by:   Dolores Patty, MD, [facility] on 09/10/2009   Method used:   Electronically to        [facility] (retail)       [address]       [place], [place]  [address]       Ph: [phone]       Fax: [phone]   RxID:   [id number]

## 2010-06-25 NOTE — Progress Notes (Signed)
Summary: Micardis Samples  Phone Note Call from Patient Call back at [facility] Phone [phone]   Caller: Patient Call For: D. Thomos Lemons DO Summary of Call: patient called and left voice message requesting Micardis samples until her appointment .  Call was returned to patient at [phone], she has been advised samples would be left at front desk for pick up.  Initial call taken by: Glendell Docker CMA,  March 18, 2010 12:00 PM    Prescriptions: MICARDIS 40 MG TABS (TELMISARTAN) one by mouth once daily  #30 x 0   Entered by:   Glendell Docker CMA   Authorized by:   D. Thomos Lemons DO   Signed by:   Glendell Docker CMA on 03/18/2010   Method used:   Samples Given   RxID:   [id number]

## 2010-06-25 NOTE — Miscellaneous (Signed)
Summary: Orders Update  Clinical Lists Changes  Orders: Added new Test order of Abdominal Aorta Duplex (Abd Aorta Duplex) - Signed 

## 2010-06-25 NOTE — Progress Notes (Signed)
Summary: RX   Phone Note Call from Patient Call back at Home Phone [phone]   Caller: SELF Call For: Levis Nazir Summary of Call: LOSARTIN IS WORKING WELL AND WOULD ANOTHER PRESCRIPTION TO [facility] FOR 90 DAYS Initial call taken by: Harlon Flor,  Oct 03, 2009 9:44 AM    Prescriptions: COZAAR 100 MG TABS (LOSARTAN POTASSIUM) 1 tab daily  #90 x 3   Entered by:   Mercer Pod   Authorized by:   Dolores Patty, MD, [facility]   Signed by:   Mercer Pod on 10/04/2009   Method used:   Electronically to        [facility] (mail-order)             ,          Ph: [phone]       Fax: [phone]   RxID:   [id number]

## 2010-06-25 NOTE — Miscellaneous (Signed)
Summary: Orders Update  Clinical Lists Changes  Orders: Added new Test order of Carotid Duplex (Carotid Duplex) - Signed 

## 2010-06-25 NOTE — Progress Notes (Signed)
Summary: needs written scripts for meds  Phone Note Refill Request Call back at Home Phone [phone] Call back at [phone] Message from:  Patient  Refills Requested: Medication #1:  QUINAPRIL HCL 20 MG  TABS by mouth two times a day  Medication #2:  MICROZIDE 12.5 MG CAPS once a day  Medication #3:  CITALOPRAM HYDROBROMIDE 40 MG  TABS one by mouth once daily  Medication #4:  simvastatin 40 mg Also omeprazole.  Chart says she takes nexium but pt says she has been taking omeprazole for years.  She needs written scripts for mail order, please call when ready.  Initial call taken by: Lowella Petties CMA,  August 08, 2009 12:23 PM    New/Updated Medications: SIMVASTATIN 40 MG TABS (SIMVASTATIN) take one by mouth daily OMEPRAZOLE 20 MG CPDR (OMEPRAZOLE) 1 by mouth daily 30 minutes before breakfast Prescriptions: OMEPRAZOLE 20 MG CPDR (OMEPRAZOLE) 1 by mouth daily 30 minutes before breakfast  #90 x 3   Entered and Authorized by:   Hannah Beat MD   Signed by:   Hannah Beat MD on 08/09/2009   Method used:   Print then Give to Patient   RxID:   [id number] MICROZIDE 12.5 MG CAPS (HYDROCHLOROTHIAZIDE) once a day  #90 x 3   Entered and Authorized by:   Hannah Beat MD   Signed by:   Hannah Beat MD on 08/09/2009   Method used:   Print then Give to Patient   RxID:   [id number] CITALOPRAM HYDROBROMIDE 40 MG  TABS (CITALOPRAM HYDROBROMIDE) one by mouth once daily  #90 x 3   Entered and Authorized by:   Hannah Beat MD   Signed by:   Hannah Beat MD on 08/09/2009   Method used:   Print then Give to Patient   RxID:   [id number] SIMVASTATIN 40 MG TABS (SIMVASTATIN) take one by mouth daily  #90 x 3   Entered and Authorized by:   Hannah Beat MD   Signed by:   Hannah Beat MD on 08/09/2009   Method used:   Print then Give to Patient   RxID:   [id number] QUINAPRIL HCL 20 MG  TABS (QUINAPRIL HCL) by mouth two times a day  #180 x 3   Entered  and Authorized by:   Hannah Beat MD   Signed by:   Hannah Beat MD on 08/09/2009   Method used:   Print then Give to Patient   RxID:   [id number]   Prior Medications: QUINAPRIL HCL 20 MG  TABS (QUINAPRIL HCL) by mouth two times a day LOW-DOSE ASPIRIN 81 MG  TABS (ASPIRIN) by mouth once daily SIMVASTATIN 40 MG TABS (SIMVASTATIN) take one by mouth daily CITALOPRAM HYDROBROMIDE 40 MG  TABS (CITALOPRAM HYDROBROMIDE) one by mouth once daily CALCIUM 1500 MG  TABS (CALCIUM CARBONATE) by mouth two times a day SPIRIVA HANDIHALER 18 MCG  CAPS (TIOTROPIUM BROMIDE MONOHYDRATE) once daily FISH OIL   OIL (FISH OIL) by mouth once daily ACTONEL 150 MG TABS (RISEDRONATE SODIUM) one by mouth qmonthly IMDUR 30 MG  TB24 (ISOSORBIDE MONONITRATE) () one by mouth qd MICROZIDE 12.5 MG CAPS (HYDROCHLOROTHIAZIDE) once a day VICODIN ES 7.5-750 MG TABS (HYDROCODONE-ACETAMINOPHEN) 1 by mouth every 6 hours as needed Current Allergies: ! PCN ! CIPRO ! CODEINE ! DEMEROL ! ERYTHROMYCIN ! * SHELL FISH MOSTLY SHRIMP  Appended Document: needs written scripts for meds patient advised.Consuello Masse CMA

## 2010-06-25 NOTE — Progress Notes (Signed)
Summary: side effects?  Phone Note Call from Patient Call back at [phone]   Caller: Patient Call For: D. Thomos Lemons DO Summary of Call: Pt left message stating she thinks she is having side effects from Micardis HCT. She has dry throat, laryngitis, cough and urgency. Please advise. Nicki Guadalajara Fergerson CMA Duncan Dull)  April 10, 2010 5:10 PM   Follow-up for Phone Call        try swith to avalide/hctz see new rx FOV within 1 month Follow-up by: D. Thomos Lemons DO,  April 11, 2010 12:59 PM  Additional Follow-up for Phone Call Additional follow up Details #1::        Pt advised. Follow up scheduled for 05/07/10 @ 3:00. Nicki Guadalajara Fergerson CMA Duncan Dull)  April 11, 2010 1:35 PM     New/Updated Medications: AVALIDE 150-12.5 MG TABS (IRBESARTAN-HYDROCHLOROTHIAZIDE) one by mouth once daily Prescriptions: AVALIDE 150-12.5 MG TABS (IRBESARTAN-HYDROCHLOROTHIAZIDE) one by mouth once daily  #30 x 1   Entered and Authorized by:   D. Thomos Lemons DO   Signed by:   D. Thomos Lemons DO on 04/11/2010   Method used:   Electronically to        [id number] (retail)       [address]       [place], [place]  [address]       Ph: [phone]       Fax: [phone]   RxID:   [id number]

## 2010-06-25 NOTE — Progress Notes (Signed)
Summary: nuc pre-procedure  Phone Note Outgoing Call   Call placed by: Domenic Polite, CNMT,  April 09, 2010 3:58 PM Call placed to: Patient Summary of Call: Tried to call patient with instructions, no answer and unable to leave message. Initial call taken by: Domenic Polite, CNMT,  April 09, 2010 3:58 PM     Nuclear Med Background Indications for Stress Test: Evaluation for Ischemia, Graft Patency   History: CABG, COPD, Heart Catheterization, Myocardial Perfusion Study  History Comments: '99 Cath/CABG x 3 , 3/10 MPI no scar/ischemia EF = 72%, AAA(2.9x2.8)  Symptoms: Chest Pain, SOB    Nuclear Pre-Procedure Cardiac Risk Factors: Carotid Disease, Family History - CAD, History of Smoking, Hypertension, Lipids, PVD Height (in): 62 Tech Comments: 10/11 LICA 60-79% / RICA 40-59%

## 2010-06-25 NOTE — Progress Notes (Signed)
Summary: CT results  Phone Note Outgoing Call   Summary of Call: call pt - CT of chest negative for any signs of malignancy Initial call taken by: D. Thomos Lemons DO,  December 18, 2009 1:29 PM  Follow-up for Phone Call        attempted to contact patient at [phone], no answer. A detailed voice message was left informing patient per Dr Artist Pais instructions Follow-up by: Glendell Docker CMA,  December 18, 2009 1:55 PM

## 2010-06-25 NOTE — Progress Notes (Signed)
Summary: Spiriva Refill  Phone Note Call from Patient Call back at [phone]   Caller: Patient Call For: D. Thomos Lemons DO Summary of Call: patient called and left voice message requesting a refill on the Spriva inhaler.  Initial call taken by: Glendell Docker CMA,  April 17, 2010 2:11 PM  Follow-up for Phone Call        call was returned to patient at [phone], she was asked about the current refill request. She states that Dr Artist Pais has filled the rx for her in the past and she has not had to have the rx filled for a long time, because she has had more than enough. She states she is currently using the rx once per day. She would like a 90 day supply to [facility].  She was informed rx would be sent for a 90 day supply to [facility] Follow-up by: Glendell Docker CMA,  April 17, 2010 2:14 PM    New/Updated Medications: SPIRIVA HANDIHALER 18 MCG  CAPS (TIOTROPIUM BROMIDE MONOHYDRATE) inhale one capsule by mouth once daily as directed Prescriptions: SPIRIVA HANDIHALER 18 MCG  CAPS (TIOTROPIUM BROMIDE MONOHYDRATE) inhale one capsule by mouth once daily as directed  #90 x 0   Entered by:   Glendell Docker CMA   Authorized by:   D. Thomos Lemons DO   Signed by:   Glendell Docker CMA on 04/17/2010   Method used:   Faxed to ...       [facility] (mail-order)             , [place]         Ph: [phone]       Fax: [phone]   RxID:   [id number]

## 2010-06-25 NOTE — Assessment & Plan Note (Signed)
Summary: 2 month fu/dt   Vital Signs:  Patient profile:   70 year old [name] Height:      62 inches Weight:      145.75 pounds BMI:     26.75 O2 Sat:      97 % on Room air Temp:     98.1 degrees F oral Pulse rate:   57 / minute Pulse rhythm:   regular Resp:     18 per minute BP sitting:   140 / 80  (right arm) Cuff size:   regular  Vitals Entered By: Glendell Docker CMA (February 19, 2010 10:06 AM)  O2 Flow:  Room air  Contraindications/Deferment of Procedures/Staging:    Test/Procedure: FLU VAX    Reason for deferment: patient declined  CC: 2 Month Follow up Is Patient Diabetic? No Pain Assessment Patient in pain? no      Comments concerned with blood pressure all over the place. this am was 172/85 p 62   Primary Care Slayde Brault:  [name] DO  CC:  2 Month Follow up.  History of Present Illness: Andrea [name] for f/u re: htn quinapril changed to lasartan much less cough since stopping quinapril but BP has been somewhat erratic home bp readings reviewed no chest pain or headache  left shoulder discomfort - resolved with use of voltaren gel  Preventive Screening-Counseling & Management  Alcohol-Tobacco     Smoking Status: quit  Allergies: 1)  ! Pcn 2)  ! Cipro 3)  ! Codeine 4)  ! Demerol 5)  ! Erythromycin 6)  ! * Shell Fish Mostly Shrimp  Past History:  Past Medical History: COPD Chest pain cerebrovascular disease      --carotid u/s 9/10: 0-39% R 60-79% L Coronary artery disease   --s/CABG  --Myoview normal 3/10  AAA  --AAA (2.8 x 3.0 in 9/10)  --moderate RAS Depression  Dizziness GERD Hyperlipidemia Hypertension Osteoporosis Peripheral vascular disease Renal artery stenosis (70% left renal artery) AAA 2.7 x 2.7cm (07/11/05)   Past Surgical History: Coronary artery bypass graft Tubal ligation Abdominal aortic aneurysm repair   femoral artery stent    Family History: Significant for heart disease; mother had a  myocardial infarction at age 32; father has a history of lung and colon cancer, the colon cancer was diagnosed in his 17s; and brother has problems with hypertension and had a percutaneous transluminal coronary angioplasty with stent in the past.    sister had lung cancer      Social History: Retired, former Engineer, civil (consulting)  Married with 5 children Former Smoker - quit 12 yrs ago (1/2 ppd since age 10) Alcohol use-no       Physical Exam  General:  alert, well-developed, and well-nourished.   Lungs:  normal respiratory effort and normal breath sounds.   Heart:  normal rate, regular rhythm, and no gallop.   Extremities:  No lower extremity edema   Impression & Recommendations:  Problem # 1:  HYPERTENSION (ICD-401.9) Home BP readings higher 150's to 160's systolic.  change losartan to micardis. continue to monitor BP at home  Her updated medication list for this problem includes:    Micardis 40 Mg Tabs (Telmisartan) ..... One by mouth once daily    Microzide 12.5 Mg Caps (Hydrochlorothiazide) ..... Once a day  BP today: 140/80 Prior BP: 136/80 (12/18/2009)  Labs Reviewed: K+: 3.8 (03/20/2009) Creat: : 0.9 (03/20/2009)   Chol: 268 (09/11/2009)   HDL: 35 (09/11/2009)   LDL: 181 (09/11/2009)  TG: 260 (09/11/2009)  Problem # 2:  SHOULDER PAIN, LEFT (ICD-719.41) Assessment: Improved Improved with samples of volataren gel  Her updated medication list for this problem includes:    Low-dose Aspirin 81 Mg Tabs (Aspirin) ..... By mouth once daily    Hydrocodone-acetaminophen 5-500 Mg Tabs (Hydrocodone-acetaminophen) ..... One by mouth two times a day as needed for low back pain  Complete Medication List: 1)  Micardis 40 Mg Tabs (Telmisartan) .... One by mouth once daily 2)  Low-dose Aspirin 81 Mg Tabs (Aspirin) .... By mouth once daily 3)  Crestor 40 Mg Tabs (Rosuvastatin calcium) .... Take one tablet by mouth daily. 4)  Sertraline Hcl 100 Mg Tabs (Sertraline hcl) .... One by mouth once  daily 5)  Calcium 1500 Mg Tabs (Calcium carbonate) .... By mouth two times a day 6)  Nexium 40 Mg Cpdr (Esomeprazole magnesium) .... One by mouth once daily 7)  Spiriva Handihaler 18 Mcg Caps (Tiotropium bromide monohydrate) .... Once daily as needed 8)  Fish Oil Oil (Fish oil) .... By mouth once daily 9)  Actonel 150 Mg Tabs (Risedronate sodium) .... One by mouth qmonthly 10)  Imdur 30 Mg Tb24 (isosorbide Mononitrate)  .... One by mouth qd 11)  Microzide 12.5 Mg Caps (Hydrochlorothiazide) .... Once a day 12)  Hydrocodone-acetaminophen 5-500 Mg Tabs (Hydrocodone-acetaminophen) .... One by mouth two times a day as needed for low back pain 13)  Voltaren 1 % Gel (Diclofenac sodium) .... Apply tid  Patient Instructions: 1)  Please schedule a follow-up appointment in 1 month. 2)  BMP prior to visit, ICD-9: 401.9 3)  Please return for lab work one (1) week before your next appointment.   Current Allergies (reviewed today): ! PCN ! CIPRO ! CODEINE ! DEMEROL ! ERYTHROMYCIN ! * SHELL FISH MOSTLY SHRIMP   Preventive Care Screening  Last Flu Shot:    Date:  02/19/2010    Results:  Declined  Pap Smear:    Date:  05/07/2009    Results:  normal

## 2010-06-27 NOTE — Assessment & Plan Note (Signed)
Summary: 1 month f/u / tf,cma--Rm 3   Vital Signs:  Patient profile:   70 year old female Height:      62 inches Weight:      147.75 pounds BMI:     27.12 O2 Sat:      95 % on Room air Temp:     98.9 degrees F oral Pulse rate:   66 / minute Pulse rhythm:   regular Resp:     16 per minute BP sitting:   100 / 68  (right arm) Cuff size:   regular  Vitals Entered By: Mervin Kung CMA Duncan Dull) (May 07, 2010 2:57 PM)  O2 Flow:  Room air CC: sinus congestion Is Patient Diabetic? No Pain Assessment Patient in pain? no      Comments Pt agrees all med doses and directions are correct. Nicki Guadalajara Fergerson CMA Duncan Dull)  May 07, 2010 3:04 PM    Primary Care Provider:  [name] DO  CC:  sinus congestion.  History of Present Illness:  Hypertension Follow-Up      This is a 70 year old woman who presents for Hypertension follow-up.  The patient denies lightheadedness and headaches.  The patient denies the following associated symptoms: chest pain.  Compliance with medications (by patient report) has been near 100%.  The patient reports that dietary compliance has been fair.  BP trending lower since starting avalide  pt also c/o sinus congestion with purulent nasal drainage   Preventive Screening-Counseling & Management  Alcohol-Tobacco     Smoking Status: quit  Allergies: 1)  ! Pcn 2)  ! Cipro 3)  ! Codeine 4)  ! Demerol 5)  ! Erythromycin 6)  ! * Shell Fish Mostly Shrimp  Past History:  Past Medical History: COPD  Chest pain cerebrovascular disease      --carotid u/s 9/10: 0-39% R 60-79% L Coronary artery disease   --s/CABG  --Myoview normal 3/10  AAA  --AAA (2.8 x 3.0 in 9/10)  --moderate RAS Depression  Dizziness GERD Hyperlipidemia Hypertension Osteoporosis Peripheral vascular disease Renal artery stenosis (70% left renal artery)  AAA 2.7 x 2.7cm (07/11/05)   Past Surgical History: Coronary artery bypass graft Tubal ligation Abdominal  aortic aneurysm repair   femoral artery stent     Close reduction of nasal fracture - 11/2007  Family History: Significant for heart disease; mother had a myocardial infarction at age 78; father has a history of lung and colon cancer, the colon cancer was diagnosed in his 22s; and brother has problems with hypertension and had a percutaneous transluminal coronary angioplasty with stent in the past.    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   Ears:  R ear normal and L ear normal.   Mouth:  pharynx pink and moist.   Lungs:  normal respiratory effort and normal breath sounds.  no wheezing Heart:  normal rate, regular rhythm, and no gallop.     Impression & Recommendations:  Problem # 1:  SINUSITIS, CHRONIC (ICD-473.9) Assessment New  Her updated medication list for this problem includes:    Avelox 400 Mg Tabs (Moxifloxacin hcl) ..... One by mouth once daily  Take antibiotics for full duration. Discussed treatment options including indications for coronal CT  scan of sinuses and ENT referral.   Problem # 2:  HYPERTENSION (ICD-401.9) Assessment: Improved  Her updated medication list for this problem includes:    Avalide 150-12.5 Mg Tabs (Irbesartan-hydrochlorothiazide) ..... One by mouth once daily  BP today: 100/68 Prior BP: 100/72 (04/01/2010)  Labs Reviewed: K+: 4.0 (03/20/2010) Creat: : 0.88 (03/20/2010)   Chol: 152 (03/20/2010)   HDL: 34 (03/20/2010)   LDL: 72 (03/20/2010)   TG: 228 (03/20/2010)  Problem # 3:  COPD (ICD-496) Assessment: Improved  Her updated medication list for this problem includes:    Spiriva Handihaler 18 Mcg Caps (Tiotropium bromide monohydrate) ..... Inhale one capsule by mouth once daily as directed    Qvar 80 Mcg/act Aers (Beclomethasone  dipropionate) .Marland Kitchen... 2 puffs two times a day  Pulmonary Functions Reviewed: O2 sat: 95 (05/07/2010)     Vaccines Reviewed: Pneumovax: Historical (05/25/2002)   Flu Vax: Declined (02/19/2010)  Complete Medication List: 1)  Low-dose Aspirin 81 Mg Tabs (Aspirin) .... By mouth once daily 2)  Crestor 40 Mg Tabs (Rosuvastatin calcium) .... Take one tablet by mouth daily. 3)  Sertraline Hcl 100 Mg Tabs (Sertraline hcl) .... One by mouth once daily 4)  Calcium 1500 Mg Tabs (Calcium carbonate) .... By mouth two times a day 5)  Nexium 40 Mg Cpdr (Esomeprazole magnesium) .... One by mouth once daily 6)  Spiriva Handihaler 18 Mcg Caps (Tiotropium bromide monohydrate) .... Inhale one capsule by mouth once daily as directed 7)  Fish Oil Oil (Fish oil) .... By mouth once daily 8)  Actonel 150 Mg Tabs (Risedronate sodium) .... One by mouth qmonthly 9)  Isosorbide Mononitrate Cr 30 Mg Xr24h-tab (Isosorbide mononitrate) .... One by mouth once daily 10)  Hydrocodone-acetaminophen 5-500 Mg Tabs (Hydrocodone-acetaminophen) .... One by mouth two times a day as needed for low back pain 11)  Voltaren 1 % Gel (Diclofenac sodium) .... Apply tid 12)  Avalide 150-12.5 Mg Tabs (Irbesartan-hydrochlorothiazide) .... One by mouth once daily 13)  Avelox 400 Mg Tabs (Moxifloxacin hcl) .... One by mouth once daily 14)  Qvar 80 Mcg/act Aers (Beclomethasone dipropionate) .... 2 puffs two times a day  Patient Instructions: 1)  Please schedule a follow-up appointment in 2 months. 2)  Follow up with your ENT re:  possible chronic sinsusitis Prescriptions: QVAR 80 MCG/ACT AERS (BECLOMETHASONE DIPROPIONATE) 2 puffs two times a day  #1 x 3   Entered and Authorized by:   D. Thomos Lemons DO   Signed by:   D. Thomos Lemons DO on 05/07/2010   Method used:   Electronically to        [id number] (retail)       [address]       [place], [place]  [address]       Ph: [phone]       Fax: [phone]   RxID:    [id number] AVALIDE 150-12.5 MG TABS (IRBESARTAN-HYDROCHLOROTHIAZIDE) one by mouth once daily  #30 x 0   Entered and Authorized by:   D. Thomos Lemons DO   Signed by:   D. Thomos Lemons DO on 05/07/2010   Method used:   Electronically to        [id number] (retail)       [address]       [place], [place]  [address]       Ph: [phone]       Fax: [phone]   RxID:   [id number] AVALIDE 150-12.5 MG TABS (  IRBESARTAN-HYDROCHLOROTHIAZIDE) one by mouth once daily  #90 x 1   Entered and Authorized by:   D. Thomos Lemons DO   Signed by:   D. Thomos Lemons DO on 05/07/2010   Method used:   Faxed to ...       [facility] (mail-order)             , [place]         Ph: [phone]       Fax: [phone]   RxID:   [id number] AVELOX 400 MG TABS (MOXIFLOXACIN HCL) one by mouth once daily  #10 x 0   Entered and Authorized by:   D. Thomos Lemons DO   Signed by:   D. Thomos Lemons DO on 05/07/2010   Method used:   Electronically to        [id number] (retail)       [address]       [place], [place]  [address]       Ph: [phone]       Fax: [phone]   RxID:   [id number]    Orders Added: 1)  Est. Patient Level III [[address]]    Current Allergies (reviewed today): ! PCN ! CIPRO ! CODEINE ! DEMEROL ! ERYTHROMYCIN ! * SHELL FISH MOSTLY SHRIMP

## 2010-06-27 NOTE — Progress Notes (Signed)
Summary: Called Pt   Phone Note Outgoing Call Call back at [facility] Phone [phone]   Call placed by: Harlon Flor,  June 03, 2010 9:32 AM Call placed to: Patient Summary of Call: [facility] to reschedule BUMP appt from 2/7 Initial call taken by: Harlon Flor,  June 03, 2010 9:32 AM

## 2010-06-27 NOTE — Progress Notes (Signed)
Summary: Pink Eye   Phone Note Call from Patient Call back at [phone]   Caller: Patient Call For: D. Thomos Lemons DO Summary of Call: patient called and left voice message stating her grandson was dianogsed and treated for pink eye. She states she is now having purulent drainage in her left eye with swelling and she would like to know if Dr Artist Pais would prescribe something for her. Initial call taken by: Glendell Docker CMA,  June 21, 2010 3:01 PM  Follow-up for Phone Call        she needs to be seen at [facility] clinic or she needs to go to eye doctor Follow-up by: D. Thomos Lemons DO,  June 21, 2010 4:39 PM  Additional Follow-up for Phone Call Additional follow up Details #1::        call returned to patient at [phone], she has been advised per Dr Artist Pais instructions, and states she will takee care of it.  Additional Follow-up by: Glendell Docker CMA,  June 21, 2010 4:44 PM

## 2010-07-02 ENCOUNTER — Ambulatory Visit: Payer: Self-pay | Admitting: Internal Medicine

## 2010-07-03 NOTE — Progress Notes (Signed)
Summary: [facility] Visit  [facility] Visit   Imported By: Earl Many 06/24/2010 18:45:46  _____________________________________________________________________  External Attachment:    Type:   Image     Comment:   External Document

## 2010-07-11 NOTE — Consult Note (Signed)
Summary: [facility] Consultation Request   [facility] Consultation Request   Imported By: Roderic Ovens 07/02/2010 11:56:24  _____________________________________________________________________  External Attachment:    Type:   Image     Comment:   External Document

## 2010-07-16 ENCOUNTER — Ambulatory Visit (INDEPENDENT_AMBULATORY_CARE_PROVIDER_SITE_OTHER): Payer: Medicare Other | Admitting: Internal Medicine

## 2010-07-16 ENCOUNTER — Encounter: Payer: Self-pay | Admitting: Internal Medicine

## 2010-07-16 DIAGNOSIS — I6529 Occlusion and stenosis of unspecified carotid artery: Secondary | ICD-10-CM

## 2010-07-16 DIAGNOSIS — M545 Low back pain, unspecified: Secondary | ICD-10-CM | POA: Insufficient documentation

## 2010-07-16 DIAGNOSIS — I714 Abdominal aortic aneurysm, without rupture: Secondary | ICD-10-CM

## 2010-07-16 DIAGNOSIS — I10 Essential (primary) hypertension: Secondary | ICD-10-CM

## 2010-07-16 DIAGNOSIS — I2510 Atherosclerotic heart disease of native coronary artery without angina pectoris: Secondary | ICD-10-CM

## 2010-07-16 DIAGNOSIS — J329 Chronic sinusitis, unspecified: Secondary | ICD-10-CM

## 2010-07-23 NOTE — Assessment & Plan Note (Signed)
Summary: ROV/AMD      Allergies Added:   Visit Type:  Follow-up Referring Provider:  Charlynne Cousins Primary Provider:  [name] DO  CC:  c/o SOB since being sick. Denies chest pain and palpitations..  History of Present Illness: Andrea [name] is a pleasant 70 y/o female who has a history of coronary artery disease status post coronary artery bypassing grafting '99, small abdominal aneurysm, cerebrovascular disease, renal artery stenosis s/p , and peripheral vascular disease.  Had a Myoview on July 27, 2008, that showed no sign of scar ischemia and her ejection fraction was 72%.  She also had a followup abdominal ultrasound that showed a stable smal  infrarenal abdominal aortic aneurysm and stable 1-59% bilateral renal artery stenoses.   Recent studies:  Carotids 10/11: RICA 40-59% LICA 60-79% (upper range and increased from previous)  Ab u/s 6/11: 2.9 x 2.8 (stable) Renals 6/11: 1-59% Bilat Myoview 04/12/2010: EF 70% no ischemia.   Here for routine f/u. At last visit was having occasional CP. We did Myoview which was normal. Still with  occasional fleeting CP. Nothing prolonged. Recently found to have pseudomonal sinus infection and still feels mildly SOB.  Taking Levaquin x 3 weeks. Remains active with nursing, work and taking care of her 3.5 y/o grandson.   At last visit we started Crestor and she is tolerating well.   We also referred her to [facility] to evalaute carotid stenosis. Saw Dr. Garth Bigness who recommneded continue surveillance. No focal neuro signs or symptoms.   Recent lipids TC 152 TG 228 HDL 34 LDL 72 (down from 181)  Current Medications (verified): 1)  Low-Dose Aspirin 81 Mg  Tabs (Aspirin) .... By Mouth Once Daily 2)  Crestor 40 Mg Tabs (Rosuvastatin Calcium) .... Take One Tablet By Mouth Daily. 3)  Sertraline Hcl 100 Mg Tabs (Sertraline Hcl) .... One By Mouth Once Daily 4)  Calcium 1500 Mg  Tabs (Calcium Carbonate) .... By Mouth Two Times A Day 5)  Nexium 40 Mg Cpdr  (Esomeprazole Magnesium) .... One By Mouth Once Daily 6)  Spiriva Handihaler 18 Mcg  Caps (Tiotropium Bromide Monohydrate) .... Inhale One Capsule By Mouth Once Daily As Directed 7)  Fish Oil   Oil (Fish Oil) .... By Mouth Once Daily 8)  Actonel 150 Mg Tabs (Risedronate Sodium) .... One By Mouth Qmonthly 9)  Isosorbide Mononitrate Cr 30 Mg Xr24h-Tab (Isosorbide Mononitrate) .... One By Mouth Once Daily 10)  Hydrocodone-Acetaminophen 5-500 Mg Tabs (Hydrocodone-Acetaminophen) .... One By Mouth Two Times A Day As Needed For Low Back Pain 11)  Avalide 150-12.5 Mg Tabs (Irbesartan-Hydrochlorothiazide) .... One By Mouth Once Daily 12)  Qvar 80 Mcg/act Aers (Beclomethasone Dipropionate) .... 2 Puffs Two Times A Day 13)  Levaquin 500 Mg Tabs (Levofloxacin) .... Take 1 Tablet By Mouth Once A Day For 21 Days  Allergies (verified): 1)  ! Pcn 2)  ! Cipro 3)  ! Codeine 4)  ! Demerol 5)  ! Erythromycin 6)  ! * Shell Fish Mostly Shrimp  Past History:  Past Medical History: Last updated: 07/16/2010 COPD  Chest pain cerebrovascular disease       --carotid u/s 9/10: 0-39% R 60-79% L Coronary artery disease   --s/CABG  --Myoview normal 3/10  AAA  --AAA (2.8 x 3.0 in 9/10)  --moderate RAS Depression  Dizziness GERD Hyperlipidemia Hypertension Osteoporosis Peripheral vascular disease Renal artery stenosis (70% left renal artery)  AAA 2.7 x 2.7cm (07/11/05)   Past Surgical History: Last updated: 05/07/2010 Coronary artery  bypass graft Tubal ligation Abdominal aortic aneurysm repair   femoral artery stent     Close reduction of nasal fracture - 11/2007  Family History: Last updated: 07/16/2010 Significant for heart disease; mother had a myocardial infarction at age 11; father has a history of lung and colon cancer, the colon cancer was diagnosed in his 2s; and brother has problems with hypertension and had a percutaneous transluminal coronary angioplasty with stent in the past.    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 Significant for heart disease; mother had a myocardial infarction at age 10; father has a history of lung and colon cancer, the colon cancer was diagnosed in his 58s; and brother has problems with hypertension and had a percutaneous transluminal coronary angioplasty with stent in the past.    sister had lung cancer         Social History: Reviewed history from 07/16/2010 and no changes required. 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   Vital Signs:  Patient profile:   70 year old female Height:      62 inches Weight:      152.25 pounds BMI:     27.95 Pulse rate:   59 / minute BP sitting:   112 / 74  (left arm) Cuff size:   regular  Vitals Entered By: Lysbeth Galas CMA (July 16, 2010 1:30 PM)  Physical Exam  General:  Well appearing. no resp difficulty. mild congestion HEENT: normal Neck: supple. no JVD. Carotids 2+ bilat; bilat bruits R > L. No lymphadenopathy or thryomegaly appreciated. Cor: PMI nondisplaced. Regular rate & rhythm. Soft SEM RSB  No rubs, gallops Lungs: clear with decreased air movement throughout Abdomen: soft, nontender, nondistended. No hepatosplenomegaly. No bruits or masses. Good bowel sounds. Extremities: no cyanosis, clubbing, rash, edema Neuro: alert & orientedx3, cranial nerves grossly intact. moves all 4 extremities w/o difficulty. affect pleasant    Impression &  Recommendations:  Problem # 1:  CAD, ARTERY BYPASS GRAFT (ICD-414.04) Doing well continues with occasional atypical CP. Myoviews very reassuring. She will continue to monitor. If symptoms progress can consider cath.   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 Followed by Dr. Myra Gianotti.   Problem # 3:  ABDOMINAL AORTIC ANEURYSM (ICD-441.4) Due for f/u ultrasound in 4 months.   Problem # 4:  HYPERLIPIDEMIA (ICD-272.4) Followed by Dr. Artist Pais. Goal LDL < 70. Continue statin.   Other Orders: EKG w/ Interpretation (93000) Carotid Duplex (Carotid Duplex) Abdominal Aorta Duplex (Abd Aorta Duplex)  Patient Instructions: 1)  Your physician recommends that you schedule a follow-up appointment in: 4months 2)  Your physician recommends that you continue on your current medications as directed. Please refer to the Current Medication list given to you today. 3)  Your physician has requested that you have an abdominal aorta duplex. During this test, an  ultrasound is used to evaluate the aorta. Allow 30 minutes for this exam. Do not eat after midnight the day before and avoid carbonated beverages. There are no restrictions or special instructions. 4)  Your physician has requested that you have a carotid duplex. This test is an ultrasound of the carotid arteries in your neck. It looks at blood flow through these arteries that supply the brain with blood. Allow one hour for this exam. There are no restrictions or special instructions.

## 2010-07-23 NOTE — Assessment & Plan Note (Signed)
Summary: 2 month follow up Andrea [name]   Vital Signs:  Patient profile:   70 year old female Height:      62 inches Weight:      150.25 pounds BMI:     27.58 O2 Sat:      97 % on Room air Temp:     97.9 degrees F oral Pulse rate:   67 / minute Resp:     20 per minute BP sitting:   134 / 80  (right arm) Cuff size:   regular  Vitals Entered By: Glendell Docker CMA (July 16, 2010 8:33 AM)  O2 Flow:  Room air CC: 2 month follow up  Is Patient Diabetic? No Pain Assessment Patient in pain? yes     Location: hip Intensity: 2-6 Type: aching Onset of pain  Chronic Comments c/o pain on right just above waist for the past month, currently taking Levaquin for sinus infection day 5 of 21     Last PAP Result Due   Primary Care Provider:  [name] DO  CC:  2 month follow up .  History of Present Illness: 70 y/o female for follow up  int hx seen by ID physician for chronic sinusitis (Dr. Casimiro Needle Blocker) used prednisone to shrink nasal polyps sinus culture showed pseudomonas pt now taking 3 weeks of levaquin some improvement in sinus symptoms.  still feels congested  c/o intermittent right low back pain worse with prolonged sitting takes tylenol as needed   Preventive Screening-Counseling & Management  Alcohol-Tobacco     Smoking Status: quit  Allergies: 1)  ! Pcn 2)  ! Cipro 3)  ! Codeine 4)  ! Demerol 5)  ! Erythromycin 6)  ! * Shell Fish Mostly Shrimp  Past History:  Past Medical History: COPD  Chest pain cerebrovascular disease       --carotid u/s 9/10: 0-39% R 60-79% L Coronary artery disease   --s/CABG  --Myoview normal 3/10  AAA  --AAA (2.8 x 3.0 in 9/10)  --moderate RAS Depression  Dizziness GERD Hyperlipidemia Hypertension Osteoporosis Peripheral vascular disease Renal artery stenosis (70% left renal artery)  AAA 2.7 x 2.7cm (07/11/05)   Family History: Significant for heart disease; mother had a myocardial infarction at age 43;  father has a history of lung and colon cancer, the colon cancer was diagnosed in his 66s; and brother has problems with hypertension and had a percutaneous transluminal coronary angioplasty with stent in the past.    sister had lung cancer         Social History: Retired, former Engineer, civil (consulting)  Married with 5 children Former Smoker - quit 12 yrs ago (1/2 ppd since age 67) Alcohol use-no          Physical Exam  General:  alert, well-developed, and well-nourished.   Lungs:  normal respiratory effort and normal breath sounds.  no wheezing Heart:  normal rate, regular rhythm, and no gallop.   Neurologic:  cranial nerves II-XII intact and gait normal.   Skin:  no rash   Impression & Recommendations:  Problem # 1:  SINUSITIS, CHRONIC (ICD-473.9) Assessment Improved seen by ID physician for chronic sinusitis (Dr. Casimiro Needle Blocker) used prednisone to shrink nasal polyps sinus culture showed pseudomonas pt now taking 3 weeks of levaquin some improvement in sinus symptoms.  still feels congested  The following medications were removed from the medication list:    Avelox 400 Mg Tabs (Moxifloxacin hcl) ..... One by mouth once daily Her updated medication list for  this problem includes:    Levaquin 500 Mg Tabs (Levofloxacin) .Marland Kitchen... Take 1 tablet by mouth once a day for 21 days  Problem # 2:  HYPERTENSION (ICD-401.9) Assessment: Unchanged  Her updated medication list for this problem includes:    Avalide 150-12.5 Mg Tabs (Irbesartan-hydrochlorothiazide) ..... One by mouth once daily  BP today: 134/80 Prior BP: 100/68 (05/07/2010)  Labs Reviewed: K+: 4.0 (03/20/2010) Creat: : 0.88 (03/20/2010)   Chol: 152 (03/20/2010)   HDL: 34 (03/20/2010)   LDL: 72 (03/20/2010)   TG: 228 (03/20/2010)  Problem # 3:  BACK PAIN, LUMBAR (ICD-724.2)  Her updated medication list for this problem includes:    Low-dose Aspirin 81 Mg Tabs (Aspirin) ..... By mouth once daily    Hydrocodone-acetaminophen 5-500 Mg  Tabs (Hydrocodone-acetaminophen) ..... One by mouth two times a day as needed for low back pain  Complete Medication List: 1)  Low-dose Aspirin 81 Mg Tabs (Aspirin) .... By mouth once daily 2)  Crestor 40 Mg Tabs (Rosuvastatin calcium) .... Take one tablet by mouth daily. 3)  Sertraline Hcl 100 Mg Tabs (Sertraline hcl) .... One by mouth once daily 4)  Calcium 1500 Mg Tabs (Calcium carbonate) .... By mouth two times a day 5)  Nexium 40 Mg Cpdr (Esomeprazole magnesium) .... One by mouth once daily 6)  Spiriva Handihaler 18 Mcg Caps (Tiotropium bromide monohydrate) .... Inhale one capsule by mouth once daily as directed 7)  Fish Oil Oil (Fish oil) .... By mouth once daily 8)  Actonel 150 Mg Tabs (Risedronate sodium) .... One by mouth qmonthly 9)  Isosorbide Mononitrate Cr 30 Mg Xr24h-tab (Isosorbide mononitrate) .... One by mouth once daily 10)  Hydrocodone-acetaminophen 5-500 Mg Tabs (Hydrocodone-acetaminophen) .... One by mouth two times a day as needed for low back pain 11)  Avalide 150-12.5 Mg Tabs (Irbesartan-hydrochlorothiazide) .... One by mouth once daily 12)  Qvar 80 Mcg/act Aers (Beclomethasone dipropionate) .... 2 puffs two times a day 13)  Levaquin 500 Mg Tabs (Levofloxacin) .... Take 1 tablet by mouth once a day for 21 days  Patient Instructions: 1)  Please schedule a follow-up appointment in 4 months. 2)  BMP prior to visit, ICD-9:  401.9 3)  Please return for lab work one (1) week before your next appointment.  Prescriptions: HYDROCODONE-ACETAMINOPHEN 5-500 MG TABS (HYDROCODONE-ACETAMINOPHEN) one by mouth two times a day as needed for low back pain  #30 x 1   Entered and Authorized by:   D. Thomos Lemons DO   Signed by:   D. Thomos Lemons DO on 07/16/2010   Method used:   Print then Give to Patient   RxID:   [id number] SPIRIVA HANDIHALER 18 MCG  CAPS (TIOTROPIUM BROMIDE MONOHYDRATE) inhale one capsule by mouth once daily as directed  #90 x 1   Entered and Authorized by:   D.  Thomos Lemons DO   Signed by:   D. Thomos Lemons DO on 07/16/2010   Method used:   Electronically to        [id number] (retail)       [address]       [place], [place]  [address]       Ph: [phone]       Fax: [phone]   RxID:   [id number] CRESTOR 40 MG TABS (ROSUVASTATIN CALCIUM) Take one tablet by mouth daily.  #90 x 3   Entered and Authorized by:   D. Thomos Lemons DO   Signed by:   D. Thomos Lemons  DO on 07/16/2010   Method used:   Electronically to        [id number] (retail)       [address]       [place], [place]  [address]       Ph: [phone]       Fax: [phone]   RxID:   [id number]    Orders Added: 1)  Est. Patient Level III [[address]]    Current Allergies (reviewed today): ! PCN ! CIPRO ! CODEINE ! DEMEROL ! ERYTHROMYCIN ! * SHELL FISH MOSTLY SHRIMP   Preventive Care Screening  Mammogram:    Date:  07/16/2010    Results:  Due  Pap Smear:    Date:  07/16/2010    Results:  Due

## 2010-07-31 ENCOUNTER — Ambulatory Visit: Payer: Self-pay | Admitting: Internal Medicine

## 2010-08-19 ENCOUNTER — Ambulatory Visit: Payer: Medicare Other | Admitting: Internal Medicine

## 2010-08-19 ENCOUNTER — Encounter: Payer: Self-pay | Admitting: Internal Medicine

## 2010-08-19 ENCOUNTER — Ambulatory Visit (INDEPENDENT_AMBULATORY_CARE_PROVIDER_SITE_OTHER): Payer: Medicare Other | Admitting: Internal Medicine

## 2010-08-19 VITALS — BP 124/80 | HR 67 | Temp 98.4°F | Resp 20 | Ht 62.0 in | Wt 150.0 lb

## 2010-08-19 DIAGNOSIS — R5383 Other fatigue: Secondary | ICD-10-CM

## 2010-08-19 DIAGNOSIS — I10 Essential (primary) hypertension: Secondary | ICD-10-CM

## 2010-08-19 DIAGNOSIS — R7989 Other specified abnormal findings of blood chemistry: Secondary | ICD-10-CM

## 2010-08-19 DIAGNOSIS — R5381 Other malaise: Secondary | ICD-10-CM

## 2010-08-19 LAB — T4, FREE: Free T4: 0.92 ng/dL (ref 0.80–1.80)

## 2010-08-19 LAB — TSH: TSH: 3.969 u[IU]/mL (ref 0.350–4.500)

## 2010-08-19 LAB — HM PAP SMEAR

## 2010-08-19 NOTE — Patient Instructions (Signed)
Our office will contact you re:  Blood test results

## 2010-08-19 NOTE — Assessment & Plan Note (Signed)
Pt complained of chronic fatigue to ID specialist.  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

## 2010-08-19 NOTE — Progress Notes (Signed)
Subjective:    [name], female    DOB: 03-Feb-1941, 70 y.o.   MRN: [id number]  HPI 70 y/o female was recently evaluated by ID specialist Pt c/o chronic fatigue TSH elevated at 6.880  She denies other symptoms of hypothyroidism - constipation, cold intolerance, or hair loss  Sister has hx of autoimmune hypothyroidism  Past Medical History  Diagnosis Date  . COPD (chronic obstructive pulmonary disease)   . Cerebrovascular disease 01/2009    carotid u/s  R 0-39%   L 60-79%  . CAD (coronary artery disease)     s/CABG  Myoview normal 3/10  . AAA (abdominal aortic aneurysm) 01/2009    AAA (2.8 x 3.0)  moderate RAS (left)  . Depression   . GERD (gastroesophageal reflux disease)   . Hypertension   . Hyperlipidemia   . Osteoporosis   . PVD (peripheral vascular disease)   . Dizziness     History   Social History  . Marital Status: Married    Spouse Name: N/A    Number of Children: 5  . Years of Education: N/A   Occupational History  . Retired     former  Engineer, civil (consulting)   Social History Main Topics  . Smoking status: Former Smoker -- 0.5 packs/day for 40 years    Types: Cigarettes  . Smokeless tobacco: Not on file  . Alcohol Use: No  . Drug Use:   . Sexually Active:    Other Topics Concern  . Not on file   Social History Narrative  . No narrative on file    Past Surgical History  Procedure Date  . Coronary artery bypass graft   . Tubal ligation   . Abdominal aortic aneurysm repair   . Femoral artery stent   . Closed reduction nasal fracture 11/2007    Family History  Problem Relation Age of Onset  . Heart attack Mother 54  . Cancer Father 42    colon cancer  . Cancer Sister     lung  . Hypertension Brother   . Hypothyroidism Sister     autoimmune    Allergies  Allergen Reactions  . Ciprofloxacin     REACTION: rash IV  . Codeine     REACTION: nausea/vomiting  . Erythromycin     REACTION: tongue burns  . Meperidine Hcl     REACTION:  Nausea/vomiting  . Penicillins     REACTION: rash    Current Outpatient Prescriptions on File Prior to Visit  Medication Sig Dispense Refill  . aspirin 81 MG tablet Take 81 mg by mouth daily.        . beclomethasone (QVAR) 80 MCG/ACT inhaler Inhale 2 puffs into the lungs 2 (two) times daily.        . Calcium Carbonate 1500 MG TABS Take 1 tablet by mouth 2 (two) times daily.        . diclofenac sodium (VOLTAREN) 1 % GEL Apply topically 3 (three) times daily.        Marland Kitchen esomeprazole (NEXIUM) 40 MG capsule Take 40 mg by mouth every morning before breakfast.        . fish oil-omega-3 fatty acids 1000 MG capsule Take 1 g by mouth daily.        Marland Kitchen HYDROcodone-acetaminophen (VICODIN) 5-500 MG per tablet Take 1 tablet by mouth 2 (two) times daily. For low back pain       . irbesartan-hydrochlorothiazide (AVALIDE) 150-12.5 MG per tablet Take 1 tablet by mouth  daily.        . isosorbide mononitrate (IMDUR) 30 MG 24 hr tablet Take 30 mg by mouth daily.        . risedronate (ACTONEL) 150 MG tablet Take 150 mg by mouth every 30 (thirty) days. with water on empty stomach, nothing by mouth or lie down for next 30 minutes.       . rosuvastatin (CRESTOR) 40 MG tablet Take 40 mg by mouth daily.        . sertraline (ZOLOFT) 100 MG tablet Take 100 mg by mouth daily.        Marland Kitchen tiotropium (SPIRIVA) 18 MCG inhalation capsule Place 18 mcg into inhaler and inhale daily.          BP 124/80  Pulse 67  Temp(Src) 98.4 F (36.9 C) (Oral)  Resp 20  Ht 5\' 2"  (1.575 m)  Wt 150 lb (68.04 kg)  BMI 27.44 kg/m2  SpO2 98%     Review of Systems     Objective:   Physical Exam  Constitutional: She appears well-developed and well-nourished.  HENT:  Head: Normocephalic and atraumatic.  Neck: Normal range of motion. Neck supple. No thyromegaly present.  Cardiovascular: Normal rate, regular rhythm and normal heart sounds.   Pulmonary/Chest: Breath sounds normal. No respiratory distress.          Assessment &  Plan:

## 2010-08-20 ENCOUNTER — Telehealth: Payer: Self-pay | Admitting: Internal Medicine

## 2010-08-20 DIAGNOSIS — R7989 Other specified abnormal findings of blood chemistry: Secondary | ICD-10-CM

## 2010-08-20 LAB — THYROID ANTIBODIES
Thyroglobulin Ab: <20 U/mL (ref <40.0)
Thyroperoxidase Ab SerPl-aCnc: <10 IU/mL (ref <35.0)

## 2010-08-20 NOTE — Telephone Encounter (Signed)
Call pt - thyroid blood tests are normal.  Previous abnormality may be associated with transient thyroiditis.   I suggest pt return in 3 months for repeat thyroid blood tests  TSH, and Free T4

## 2010-08-20 NOTE — Telephone Encounter (Signed)
Call placed to patient at [phone], she was informed per Dr Artist Pais instructions, lab order entered for June 2012

## 2010-08-26 ENCOUNTER — Encounter: Payer: Self-pay | Admitting: Internal Medicine

## 2010-08-26 ENCOUNTER — Ambulatory Visit: Payer: Self-pay | Admitting: Internal Medicine

## 2010-08-26 DIAGNOSIS — Z0289 Encounter for other administrative examinations: Secondary | ICD-10-CM

## 2010-08-26 NOTE — Assessment & Plan Note (Signed)
Well controlled.  Continue current medication regimen.   BP Readings from Last 3 Encounters:  08/19/10 124/80  07/16/10 112/74  07/16/10 134/80

## 2010-08-29 ENCOUNTER — Ambulatory Visit: Payer: Medicare Other | Admitting: Internal Medicine

## 2010-10-08 NOTE — Consult Note (Signed)
NAMEBRISHA, MCCABE NO.:  [id number]   MEDICAL RECORD NO.:  [id number]          PATIENT TYPE:  EMS   LOCATION:  [facility]JO                         FACILITY:  [facility]   PHYSICIAN:  Hilda Lias, M.D.   DATE OF BIRTH:  11/17/1940   DATE OF CONSULTATION:  11/06/2007  DATE OF DISCHARGE:  11/06/2007                                 CONSULTATION   HISTORY OF PRESENT ILLNESS:  Andrea [name] is a 70 year old female who had  been seen today in the emergency room after she fell at the home almost  a week ago.  She stumbled against a wet floor and she fell hitting her  head.  There was no loss of consciousness.  Severe headache.  From then  on, she relayed that both eyes were quite bloody and then black.  She  stayed at home.  Continued to take pain mediations.  Now, she has been  complaining of a little bit of headache associated with some diplopia.  CT scan of the head was obtained, and we were called for evaluation.   Clinically, the patient is oriented x3.  She has a full range of motion  of the neck.  Head and throat, there is no Battle sign.  There is no  discharge from the nose or from the ear.  She has had bilateral raccoon  eyes and right now they are sort of yellow and green.  Sclerae is  completely normal.  The pupils are equal and reactive.  She has some  diplopia and extreme lateralization.  Hearing is normal.   The CT scan showed no CSF, subdural fluid collection with no evidence of  any shift.  There is a fracture of the hard palate, which was going to  be addressed by the ENT.   CLINICAL HISTORY:  Status post closed head injury.  No evidence of any  surgical lesion.   RECOMMENDATIONS:  The patient is going to go home.  She is going to  follow either with [place] or with her medical doctor.  Ideally, we should  repeat the CT scan in a week to 10 days from now.  She was advised to  stay away from any antiinflammatory including aspirin.  She is going to  be taking  Tylenol for pain.  Any questions she has my phone number to  call me anytime or otherwise, she will follow up with [place] or with her  medical doctor.           ______________________________  Hilda Lias, M.D.     EB/MEDQ  D:  11/06/2007  T:  11/07/2007  Job:  [id number]

## 2010-10-08 NOTE — Discharge Summary (Signed)
Andrea [name], AUTHIER NO.:  [id number]   MEDICAL RECORD NO.:  [id number]          PATIENT TYPE:  EMS   LOCATION:  [facility]JO                         FACILITY:  [facility]   PHYSICIAN:  Suzanna Obey, M.D.       DATE OF BIRTH:  07/08/1940   DATE OF ADMISSION:  11/06/2007  DATE OF DISCHARGE:  11/06/2007                               DISCHARGE SUMMARY   ADMISSION DIAGNOSES:  1. Blurred vision.  2. Palate fracture.  3. Possible nasal fracture.   This is a 70 year old who fell on Monday and suspectedly sustained a  nasal fracture, seen in our primary care office and sent to Dr. Pollyann Walck.  He evaluated her and discussion of their plan is not evident.  The  patient has had a problem with recent increasing blurred vision.  She  has noticed that over today that her vision is clearly more blurred than  it was previously.  A CT scan was performed in order to evaluate this,  as well as her trauma and there was a resulting radiology reading of a  palate fracture.  There is a deviated septum but no evidence of acute  trauma to the sinus cavity.  The patient has not had any malocclusion.  No ecchymosis or swelling in the mouth.  Her nose is not really having  any significant change in the air flow prior to the injury.  She says  the nose was deviated to the right prior to the injury and now it is  deviated to the left.  She had just a small amount of epistaxis at the  time of the fall.  She fell on the frontal region.  She also had a  finding on the CT scan of a subdural lesion that has not been evaluated  but was reported by the radiologist to be an old problem.   PHYSICAL EXAMINATION:  GENERAL - The patient is awake and alert.  HEENT:  Eyes - There is no evidence of injury to the eye bilaterally.  She seems to have a reasonable range of motion and that pupils are  equally round and reactive to light.  Nose - the septum is deviated to  the right and it is somewhat erythematous, so there is  a possibility of  acute trauma.  The dorsum seems to be midline to my observation and no  obvious swelling.  There is some swelling lateral to the nasal dorsum  into the infraorbital area bilaterally which is minimal.  Oral  cavity/oropharynx - she is wearing denture and it was removed and there  is no evidence of any lesions or ecchymosis in her oral cavity,  oropharynx.  Neck is without swelling.   ASSESSMENT/PLAN:  Palate fracture - she has on the examination of the CT  scan an area in the anterior maxilla that extends up into the left nasal  floor what  I would suspect to be a cyst.  The edges looked fairly  smooth and the opening into the nasal floor is definitely a smooth area  that stops about 1 cm into the nose  posteriorly.  She does have a  history of a cyst of some sort removed from her maxilla 50 years ago.  She is not having any symptoms in this location.  Regarding the blurred  vision, the emergency room is going to have her evaluated by  Neurosurgery and then proceed from there for further evaluation of this  acute blurred vision.  Thank you.          ______________________________  Suzanna Obey, M.D.    JB/MEDQ  D:  11/06/2007  T:  11/07/2007  Job:  [id number]   cc:   Trauma Service

## 2010-10-08 NOTE — Assessment & Plan Note (Signed)
OFFICE VISIT   Andrea [name], FRONEK  DOB:  1940/06/02                                       04/29/2010  CHART#:[id number]   REASON FOR VISIT:  Carotid stenosis.   REFERRING PHYSICIAN:  Bevelyn Buckles. Bensimhon, M.D.   HISTORY:  This is a 70 year old female I am seeing at the request of Dr.  Gala Romney for evaluation of carotid occlusive disease.  The patient  initially had a bruit auscultated on physical exam and has been followed  with carotid ultrasound for many years.  Her most recent ultrasound  showed a progression to 60% to 79% stenosis on the left, closure towards  the higher end of that range.  She therefore is referred for evaluation.   The patient denies any acute symptoms.  Specifically, she denies  numbness or weakness in either extremity.  She denies slurring of her  speech.  She denies amaurosis fugax.  She does state that she will  occasionally have trouble with word-finding, and her coordination is not  quite as good as it used to be prior to her coronary artery bypass graft  in 1999.  She was told that she may have a small stroke while on pump.  Again, she has not had any acute symptoms.   The patient suffers from hypertension and hypercholesterolemia, both of  which are medically managed.  She also suffers from COPD and is not on  home oxygen.  She had a heart attack in 1994 and has had a CABG x3 in  1999, performed in [place].  She has also had femoral stents placed by  Dr. Samule Ohm for claudication.   REVIEW OF SYSTEMS:  GENERAL:  Negative fevers, chills, weight gain,  weight loss.  VASCULAR:  Positive for pain in legs with walking.  CARDIAC:  Positive for chest pain, chest tightness, shortness breath  with exertion and palpitations.  GI:  Negative.  NEURO:  Negative.  PULMONARY:  Positive for wheezing.  HEME:  Negative.  GU:  Negative.  ENT:  Negative.  MUSCULOSKELETAL:  Positive for arthritis.  PSYCH:  Positive for depression.  SKIN:   Negative.   PAST MEDICAL HISTORY:  Hypertension, hypercholesterolemia, coronary  artery disease, peripheral vascular disease, COPD.   PAST SURGICAL HISTORY:  CABG x3 in 1999 and femoral stents by Dr.  Samule Ohm.  Sinus surgery in 2010.   SOCIAL HISTORY:  She is married with 5 children.  She is retired.  Does  not smoke, quit in 1994.  Occasional alcohol.   FAMILY HISTORY:  Positive for coronary disease in her mother, who had a  heart attack at 81.  Her father had coronary disease later in life. Her  brother had coronary disease after age 72.   ALLERGIES:  Cipro, penicillin, erythromycin, Demerol, codeine.   MEDICATIONS:  Please see medical record.   PHYSICAL EXAMINATION:  Heart rate 78, blood pressure 143/77 in the right  arm, 107/69 the left, O2 sat 98% on room air.  General:  She is well-  appearing in no distress.  HEENT within normal limits.  Lungs are clear  bilaterally.  No wheezes or rhonchi.  Cardiovascular:  Regular rate and  rhythm.  No murmur.  No carotid bruit.  Palpable femoral and posterior  tibial pulses bilaterally.  Abdomen:  Soft, nontender.  No pulsatile  mass.  Musculoskeletal is without major  deformities.  Neuro:  No focal  deficits.  Cranial nerves II-XII are grossly intact.  Skin is without  rash.   DIAGNOSTIC STUDIES:  I have reviewed the carotid ultrasound performed in  [place] office.  It shows 60%  to 79% left carotid stenosis, 40% to 59%  right carotid stenosis.  End diastolic velocity was 93 on the left.   ASSESSMENT:  Asymptomatic bilateral carotid stenosis, left greater than  right.   PLAN:  We discussed today continued medical management of her carotid  occlusive disease as long as she remains asymptomatic.  We discussed the  signs and symptoms of TIA/stroke, and she was instructed to go to the  emergency department should any of these happen.  I will plan on  reevaluating her with a carotid ultrasound in 6 months.     Jorge Ny, MD   Electronically Signed   VWB/MEDQ  D:  04/29/2010  T:  04/30/2010  Job:  [id number]   cc:   Bevelyn Buckles. Bensimhon, MD

## 2010-10-08 NOTE — Op Note (Signed)
[name], [name]               ACCOUNT NO.:  [id number]   MEDICAL RECORD NO.:  [id number]          PATIENT TYPE:  AMB   LOCATION:  SDS                          FACILITY:  [facility]   PHYSICIAN:  Suzanna Obey, M.D.       DATE OF BIRTH:  [birth year]   DATE OF PROCEDURE:  DATE OF DISCHARGE:  11/24/2007                               OPERATIVE REPORT   PREOPERATIVE DIAGNOSIS:  Nasal fracture.   POSTOPERATIVE DIAGNOSIS:  Nasal fracture.   SURGICAL PROCEDURE:  Close reduction of nasal fracture.   ANESTHESIA:  General.   ESTIMATED BLOOD LOSS:  Less than 5 mL.   INDICATIONS:  A 70 year old who fell, sustained a nasal fracture, and  now has a more deviated dorsum and she felt like previous.  She has no  new nasal obstruction.  She is adamant about proceeding with the close  reduction.  She understands the repair may not be beneficial from a  standpoint of cosmesis, as the bones intensely could not move.  She was  informed the risks and benefits of the procedure and options were  discussed.  All questions were answered and consent was obtained.   OPERATION:  The patient was taken to the operating room and placed in  supine position after LMA anesthesia.  The Afrin pledgets were placed  into the nose bilaterally and then the nasal elevator was used to  attempt to manipulate the bones.  Perhaps, the right bone moved  slightly, but the right bone, which had the small hump did not seem to  move with fairly significant amount of pressure applied.  There was a  slight amount of swelling still remaining in the subcutaneous tissue,  but the dorsum did perhaps look slightly more midline from the  correction of the right nasal bone.  There was good hemostasis.  Benzoin  and Steri-Strips placed over the dorsum and a Aquaplast cast placed.  The patient was awake and brought to the recovery room in stable  condition.  Counts were correct.           ______________________________  Suzanna Obey,  M.D.     JB/MEDQ  D:  11/24/2007  T:  11/24/2007  Job:  [id number]

## 2010-10-08 NOTE — Assessment & Plan Note (Signed)
[facility] HEALTHCARE                            CARDIOLOGY OFFICE NOTE   LULIA, SCHRINER                        MRN:          [id number]  DATE:01/20/2007                            DOB:          10-16-1940    Andrea [name] is a pleasant female who I have seen in the past for  coronary artery disease.  She is status post coronary artery bypass  graft in 1999.  She also has a history of cerebrovascular disease, small  abdominal aortic aneurysm, renal artery stenosis, and peripheral  vascular disease.  Since I last saw her she has mild dyspnea on  exertion, but there is no orthopnea, PND, or exertional chest pain.  She  occasionally has minimal palpitations.  This has been evaluated  previously and showed PAC's, PVC's, and brief atrial runs.  Note, she  has not had claudication.  She is status post stenting of both common  iliac arteries on June 12, 2006.  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  She is walking and trying to follow diet.  She does not  smoke.   MEDICATIONS:  1. IMDUR 30 mg p.o. daily.  2. Aspirin 81 mg p.o. daily.  3. Celexa 20 mg p.o. daily.  4. Calcium 1500 mg p.o. b.i.d.  5. Prilosec 20 mg p.o. daily.  6. Hydrochlorothiazide 12.5 mg p.o. daily.  7. Singulair 10 mg p.o. daily.  8. Flonase.  9. Spiriva.  10.Zocor 40 mg p.o. daily.  11.Quinapril 20 mg p.o. daily.  12.Niaspan 500 mg p.o. daily.   PHYSICAL EXAMINATION:  VITAL SIGNS:  Blood pressure 110/78, pulse 60,  she weighs 144 pounds.  HEENT:  Normal with normal eye lids.  NECK:  Supple.  CHEST:  Clear.  HEART:  Regular rate and rhythm.  ABDOMEN:  Benign.  EXTREMITIES:  No edema.   DIAGNOSIS:  1. Coronary artery disease status post coronary artery bypass graft -      we will continue with her aspirin, ACE inhibitor, and statin.  We      will proceed with a stress Myoview for risk stratification.  If it  shows normal perfusion then we will continue with medical therapy.  2. Peripheral vascular disease.  The patient is status post bilateral      intervention to her iliac's.  Recent ankle brachial indices on      January 07, 2007, showed normal values.  She will need follow-up in      1 year.  3. Renal artery stenosis.  She will need follow-up renal Dopplers in 1      year as well.  She has a 70% left renal artery stenosis based on      her angiogram in January of this year.  4. Small abdominal aortic aneurysm.  We will plan to repeat abdominal      ultrasound in 1 year.  5. History of mild cerebrovascular disease.  She will need follow-up      carotid Dopplers in 1 year as well.  6. Hypertension.  Her blood pressure is adequately controlled on her      present medications.  We will check a BMET next week when she has      repeat lipids and liver.  7. Hyperlipidemia.  She will continue on her statin and Niaspan and      her lipids and liver are due to be checked this week.  8. History of chronic obstructive pulmonary disease.  9. History of gastroesophageal reflux disease.  We will see her back      in 6 months.     Madolyn Frieze Jens Som, MD, [facility]  Electronically Signed    BSC/MedQ  DD: 01/20/2007  DT: 01/20/2007  Job #: [id number]

## 2010-10-08 NOTE — Assessment & Plan Note (Signed)
[facility]ALTH[facility]                            CARDIOLOGY OFFICE NOTE   Andrea [name], Andrea [name]                        MRN:          [id number]  DATE:07/19/2008                            DOB:          1940-05-30    Andrea [name] is a pleasant female, who I have seen in the past for  coronary artery disease, status post coronary bypassing graft, small  abdominal aortic aneurysm, cerebrovascular disease, renal artery  stenosis, and peripheral vascular disease.  I last saw her on March 15, 2008.  The patient now presents in followup and is complaining of  occasional chest pain.  It is in the right chest area.  The pain can be  intermittently brief and has in seconds.  With those, it has described  as a stabbing pain.  Can also last at other times for 30 minutes.  The  pain is not pleuritic nor is it exertional.  It is not related to food.  There is a question of whether it increases with certain movements.  It  occasionally radiates to the right shoulder.  There is no associated  nausea, vomiting, shortness of breath, or diaphoresis.  Note, she is not  having dyspnea on exertion, orthopnea, PND, or pedal edema.  She is  describing occasional dizziness with standing that is new as well.   MEDICATIONS:  1. Quinapril 20 mg p.o. b.i.d.  2. Aspirin 81 mg p.o. daily.  3. Crestor 20 mg p.o. daily.  4. Citalopram.  5. Calcium.  6. Prilosec.  7. HCTZ 12.5 mg p.o. daily.  8. Spiriva 18 mcg p.o. daily.  9. Fish oil.  10.Alendronate.  11.Imdur.  12.Niaspan 500 mg p.o. daily.   PHYSICAL EXAMINATION:  VITAL SIGNS:  Blood pressure was 100/70 and her  pulse is 55.  She weighs 137 pounds.  HEENT:  Normal.  NECK:  Supple.  CHEST:  Clear.  CARDIOVASCULAR:  Bradycardic rate with regular rhythm.  ABDOMEN:  No tenderness.  EXTREMITIES:  No edema.   Her electrocardiogram shows a sinus bradycardia at a rate of 55.  The  axis is normal.  There are nonspecific T-wave changes.   It is unchanged  compared to previous.   DIAGNOSES:  1. Chest pain - Andrea [name] symptoms are atypical in nature and      electrocardiograms are unchanged.  I wonder if it may be      musculoskeletal in etiology.  We will schedule her to have an      adenosine Myoview.  If it shows no ischemia and we will continue      medical therapy.  2. Dizziness - this sounds to be orthostatic in nature.  I will      discontinue her HCTZ.  We will check a BMET when she returns for      her Myoview.  3. Coronary artery disease, status post coronary bypassing graft - she      will continue on her aspirin, angiotensin-converting enzyme      inhibitor, and statin.  Note, she does not smoke.  4. Peripheral vascular disease -  There are no symptoms of claudication      at present.  5. Renal artery stenosis - she is scheduled for followup renal      Dopplers on July 24, 2008.  6. Small abdominal aortic aneurysm - she scheduled for a followup      abdominal ultrasound on July 24, 2008.  7. Cerebrovascular use - she will need followup carotid Dopplers in      October.  She will continue on her aspirin and statin.  8. Hypertension - her blood pressure is mildly decreased today and we      will discontinue her HCTZ as described above.  9. Hyperlipidemia - she will continue her statin and Dr. Artist Pais is      following her lipids and liver.  10.Chronic obstructive pulmonary disease.  11.History of gastroesophageal reflux disease.   I will see her back in 4 weeks.     Madolyn Frieze Jens Som, MD, [facility]  Electronically Signed    BSC/MedQ  DD: 07/19/2008  DT: 07/19/2008  Job #: [id number]

## 2010-10-08 NOTE — Assessment & Plan Note (Signed)
[facility] HEALTHCARE                            CARDIOLOGY OFFICE NOTE   TRINA, ASCH                        MRN:          [id number]  DATE:03/15/2008                            DOB:          12-25-1940    Andrea [name] is a very pleasant 70 year old female who has a history of  coronary artery disease status post coronary artery bypassing graft in  1999, small abdominal aortic aneurysm, cerebrovascular disease, renal  artery stenosis, and peripheral vascular disease.  Her most recent  Myoview was performed on January 28, 2007.  At that time, her ejection  fraction was 74%.  There was no scar or ischemia.  She had carotid  Dopplers today that showed 0-39% right and 40-59% left internal carotid  artery stenosis and followup was recommended in 1 year.  She also had  ABIs performed today that were normal.  Her last abdominal ultrasound  was on July 23, 2007, showed a small abdominal aortic aneurysm.  There was moderate restenosis of the right iliac stent.  The right renal  artery had a 1-59% stenosis.  Since I last saw her, she is doing well.  There is dyspnea with more extreme activities, but not with routine  activities.  There is no orthopnea, PND, or pedal edema.  She has not  had claudication.  She has not had exertional chest pain.   MEDICATIONS:  At present include,  1. Quinapril 20 mg p.o. b.i.d.  2. Imdur 30 mg p.o. daily.  3. Aspirin 81 mg p.o. daily.  4. Zocor 80 mg p.o. daily.  5. Celexa 20 mg p.o. daily.  6. Calcium D 1500 b.i.d.  7. Fosamax.  8. Prilosec.  9. Hydrochlorothiazide 12.5 mg p.o. daily.  10.Tylenol.  11.Spiriva.  12.Fish oil.   PHYSICAL EXAMINATION:  VITAL SIGNS:  Blood pressure of 121/69 and her  pulse is 58.  She weighs 133 pounds.  HEENT:  Normal.  NECK:  Supple.  CHEST:  Clear.  CARDIOVASCULAR:  Regular rhythm.  ABDOMEN:  No tenderness.  EXTREMITIES:  No edema.   Her electrocardiogram shows a sinus rhythm at a  rate of 62.  There are  nonspecific ST changes.   DIAGNOSES:  1. Coronary artery disease status post coronary artery bypassing graft      - Andrea [name] is doing well with no exertional chest pain.  Her      recent Myoview was low risk.  We will continue with medical therapy      including her aspirin, ACE inhibitor, and statin.  She will      continue with risk factor modification including diet and exercise.      She does not smoke.  2. Peripheral vascular disease - She is not having claudication and      her followup ABIs are normal.  3. Renal artery stenosis - She will need follow up renal Dopplers in      February.  4. Small abdominal aortic aneurysm - We will also follow up on her      aneurysm in February with her abdominal ultrasound.  5.  Cerebrovascular disease - She will continue on her aspirin and      statin.  She need followup carotid Dopplers in 1 year.  6. Hypertension - Blood pressure is adequately controlled on her      present medications.  I will check a BMET to follow her potassium      and renal function.  7. Hyperlipidemia - She will continue on her statin.  We will check      lipids and liver and adjust as indicated.  8. Chronic obstructive pulmonary disease.  9. History of gastroesophageal reflux disease.   I will see her back in 12 months.     Madolyn Frieze Jens Som, MD, [facility]  Electronically Signed    BSC/MedQ  DD: 03/15/2008  DT: 03/16/2008  Job #: [id number]

## 2010-10-08 NOTE — Assessment & Plan Note (Signed)
[facility] OFFICE NOTE   EZMA, REHM                        MRN:          [id number]  DATE:07/06/2007                            DOB:          07/26/1940    Mrs. Klumpp is a very pleasant female whom I have seen in the past for  coronary artery disease.  She had coronary bypass graft in 1999.  When I  last saw her we scheduled her to have a Myoview which was performed  January 28, 2007.  There was no sign of scar ischemia.  Ejection  fraction of 74%.  She also has a history of cerebrovascular disease,  small abdominal aortic aneurysm, renal artery stenosis and peripheral  vascular disease.  Since I last saw her she is doing well.  She does  have a URI over the past 2 weeks.  However, she has not had increasing  dyspnea on exertion, orthopnea, PND, pedal edema, palpitations, pre-  syncope, syncope or chest pain.  There is no claudication.   CURRENT MEDICATIONS:  1. Imdur 30 mg daily.  2. Aspirin 81 mg daily.  3. Celexa 20 mg daily.  4. Calcium and vitamin D.  5. Prilosec.  6. Hydrochlorothiazide 12.5 mg daily.  7. Flonase.  8. Spiriva.  9. Zocor 40 mg daily.  10.Lisinopril 20 mg p.o. b.i.d.   PHYSICAL EXAM:  Today, shows a blood pressure of 90/62 and pulse is 58.  She weighs 151 pounds.  HEENT is normal.  Her neck is supple.  Chest is clear.  CARDIOVASCULAR:  Regular rate and rhythm.  ABDOMEN: Shows no tenderness.  Her extremities show no edema.   Her electrocardiogram shows sinus rhythm and rate 58.  There are  nonspecific T-wave changes.   DIAGNOSES:  1. Coronary disease status post coronary artery bypass graft - She has      had no chest pain and recent Myoview showed no ischemia.  We will      continue medical therapy including her aspirin, ACE inhibitor and      statin.  She will also continue with risk factor modification      including exercise and diet.  She does not smoke.  2.  Peripheral vascular disease - She has had no further claudication.      Will plan to repeat ABIs in August of this year.  3. Renal artery stenosis - Will plan to proceed with an abdominal      ultrasound to reassess her renal arteries.  4. Small abdominal aortic aneurysm - She is due to have repeat      abdominal ultrasound as well.  5. History of mild cerebrovascular disease - She will need followup      carotid Dopplers in August of this year.  6. Hypertension - Blood pressure adequately control. We will check a      BMET to follow potassium and renal function.  7. Hyperlipidemia - We will check lipids and liver and adjust as      indicated.  She will continue on her statin at present.  8.  History of chronic obstructive pulmonary disease.  9. Gastroesophageal reflux disease.   She will see me back in 6 months.     Madolyn Frieze Jens Som, MD, [facility]  Electronically Signed    BSC/MedQ  DD: 07/06/2007  DT: 07/07/2007  Job #: [id number]

## 2010-10-08 NOTE — Assessment & Plan Note (Signed)
[facility] HEALTHCARE                            CARDIOLOGY OFFICE NOTE   MELYSA, SCHROYER                        MRN:          [id number]  DATE:08/14/2008                            DOB:          1941/03/22    Ms. Sesay is a pleasant female who has a history of coronary artery  disease status post coronary artery bypassing graft, small abdominal  aortic aneurysm, cerebrovascular disease, renal artery stenosis, and  peripheral vascular disease.  When I last saw her, she was complaining  of occasional chest pain.  We did a Myoview on July 27, 2008, that  showed no sign of scar ischemia and her ejection fraction was 72%.  She  also had a followup abdominal ultrasound that showed a stable small  infrarenal abdominal aortic aneurysm and stable 1-59% bilateral renal  artery stenoses.  Since that time, she continues to have occasional  chest pain.  However, she now states that occurs approximately 1-2 hours  after eating and improves with Tums.  She did not have exertional chest  pain.  We did discontinue her hydrochlorothiazide when I saw her on  July 19, 2008, due to orthostatic dizziness.  Her symptoms have  continued.  She feels mildly lightheaded when she stands suddenly.  She  has not had frank syncope.   CURRENT MEDICATIONS:  1. Quinapril 20 mg p.o. b.i.d.  2. Aspirin 81 mg p.o. daily.  3. Crestor 20 mg p.o. daily.  4. Citalopram.  5. Calcium.  6. Prilosec.  7. Spiriva.  8. Fish oil.  9. Alendronate.  10.Imdur 30 mg p.o. daily.  11.Niaspan 500 mg p.o. daily.   PHYSICAL EXAMINATION:  VITAL SIGNS:  Today shows a blood pressure of  110/50 and her pulse is 48.  HEENT:  Normal.  NECK:  Supple.  CHEST:  Clear.  CARDIOVASCULAR:  Bradycardic rate, but regular rhythm.  ABDOMEN:  No tenderness.  EXTREMITIES:  No edema.   DIAGNOSES:  1. Chest pain - the patient's symptoms sound to be more      gastrointestinal in etiology.  Her Myoview was low  risk.  We will      not pursue this further at this point.  2. Dizziness - she still sounds to have her orthostatic symptoms.      However, they are not particularly troublesome to her.  If they      worsen, we can decrease her quinapril.  3. Coronary artery disease status post coronary artery bypassing graft      - she will continue on her aspirin, ACE inhibitor, and statin.  4. Peripheral vascular disease - she has had no claudication.  5. Renal artery stenosis - we will continue with medical therapy.  6. Small abdominal aortic aneurysm - she will need followup renal      Dopplers and abdominal ultrasound in March 2011.  7. Cerebrovascular disease - she will need followup carotid Dopplers      in October 2010.  She will continue on her aspirin and statin.  8. Hypertension - she will continue on her present medications.  9. Hyperlipidemia -  she will continue on her statin.  10.Chronic obstructive pulmonary disease.  11.History of gastroesophageal reflux disease.   She will see me back in 6 months.     Madolyn Frieze Jens Som, MD, [facility]  Electronically Signed    BSC/MedQ  DD: 08/14/2008  DT: 08/15/2008  Job #: [id number]

## 2010-10-11 NOTE — Assessment & Plan Note (Signed)
[facility] HEALTHCARE                            CARDIOLOGY OFFICE NOTE   Andrea [name], Andrea [name]                        MRN:          [id number]  DATE:06/05/2006                            DOB:          [birth year]    Andrea [name] returns for followup today.  Please refer to my note of  March 18, 2006 for details.  Since I last saw her, there is no  dyspnea, chest pain, or syncope.  She continues to have claudication.  Her pedal edema has improved after discontinuing the amlodipine.  Also  of note, she does continue to have palpitations.   MEDICATIONS:  1. Neurontin 300 mg p.o. nightly.  2. Imdur 30 mg p.o. daily.  3. Aspirin 81 mg p.o. daily.  4. Prilosec.  5. Tylenol.  6. Fosamax.  7. Quinapril 20 mg p.o. b.i.d.  8. Calcium.  9. Hydrochlorothiazide 12.5 mg p.o. daily.  10.Celexa 20 mg p.o. daily.  11.Zocor 40 mg p.o. nightly.   PHYSICAL EXAMINATION:  Today shows a blood pressure of 116/68 and her  pulse is 76.  CHEST:  Clear.  CARDIOVASCULAR:  Regular rate and rhythm.  EXTREMITIES:  Show no edema.   DIAGNOSES:  1. Coronary artery disease.  Status post coronary bypass and graft.  2. Peripheral vascular disease.  3. Hypertension.  4. Hyperlipidemia.  5. Small abdominal aortic aneurysm.  6. Chronic obstructive pulmonary disease.  7. Gastroesophageal reflux disease.   PLAN:  Andrea [name] is doing well from a symptomatic standpoint.  She  does continue to have palpitations and we will review her recent  CardioNet monitor.  Note, she has had problems with beta blockers in the  past but we can consider trying this if her palpitations persist.  They  have improved on Celexa.  Her LDL is greater than 100 and we will  increase her Zocor to 80 mg p.o. nightly.  We will check lipids and  liver in 6 weeks and adjust as indicated.  She would like to continue  the Zocor for financial reasons if possible.  She will need a followup  ultrasound of her aorta in  October of 2008, she will also need followup  carotid Dopplers in August of 2008.  Note,  she does not smoke.  Her  blood pressure is much better on her present medical regimen and  her renal function and potassium are stable.  She is scheduled to  undergo arteriogram for her peripheral vascular disease by Dr. Samule Ohm in  1 week.  We will see her back in 6 months.     Madolyn Frieze Jens Som, MD, [facility]  Electronically Signed    BSC/MedQ  DD: 06/05/2006  DT: 06/05/2006  Job #: [id number]

## 2010-10-11 NOTE — Cardiovascular Report (Signed)
NAMEGALE, KLAR               ACCOUNT NO.:  [id number]   MEDICAL RECORD NO.:  [id number]          PATIENT TYPE:  AMB   LOCATION:  [facility]                         FACILITY:  [facility]   PHYSICIAN:  Salvadore Farber, MD  DATE OF BIRTH:  1940-12-12   DATE OF PROCEDURE:  06/12/2006  DATE OF DISCHARGE:                            CARDIAC CATHETERIZATION   PROCEDURE:  Abdominal aortography with bilateral lower extremity runoff,  stenting of bilateral common iliac arteries, StarClose closure of  bilateral common femoral arteriotomy sites.   INDICATIONS:  Ms. Jacome is a 70 year old woman with lifestyle limiting  claudication involving both hips, thighs, and calves.  Resting ABIs are  normal bilaterally.  However, the left drops severely and the right  moderately with exercise.  She has ultrasound evidence of left renal  artery stenosis.  CT angiogram has demonstrated 2.7 x 2.7 cm infrarenal  abdominal aortic aneurysm.  She presents for abdominal aortography and  probable percutaneous revascularization of common iliac artery stenosis.   DESCRIPTION OF PROCEDURE:  Informed consent was obtained.  Under 1%  lidocaine local anesthesia, a 5-French was sheath placed in the right  common femoral artery using modified the Seldinger technique.  Pigtail  catheter was advanced through the suprarenal abdominal aorta.  Abdominal  aortography was performed by power injection.  The catheter was then  pulled back to the infrarenal abdominal aorta.  Abdominal aortography  was performed by power injection with digital subtraction imaging to the  feet.  These images demonstrated approximately 50% stenosis of the right  common iliac and at least 70% stenosis of the left common iliac.  There  was a small landing zone below the aneurysm.  To assess the severity of  the right common iliac artery stenosis, I advanced a 4-French end-hole  catheter into the infrarenal abdominal aorta.  After giving 200 mcg of  nitroglycerin via the right common femoral artery sheath, I measured  simultaneous pressures in the aorta and common femoral.  This  demonstrated a peak inducible gradient of 13 mmHg.  She was already  asymptomatic on this side and I expected some plaque shift with  treatment of the left common iliac artery stenosis resulting in  worsening of the right common iliac, I decided to treat both common  iliac arteries simultaneously.   To that end, I placed a 7-French sheath in the left common femoral  artery using modified Seldinger technique.  Anticoagulation was  initiated with heparin.  ACT was maintained at greater than 250 seconds.  I upsized the right sheath to 6-French over a wire.  I then predilated  on the left using an 8 x 20 mm Powerflex balloon at 6 atmospheres.  I  then performed kissing stenting using an 8 x 18 mm Genesis stent on the  right and an 8 x 29 mm Genesis stent on the left.  They were  simultaneously deployed at 8 atmospheres.  The pigtail catheter was then  reintroduced into the infrarenal abdominal aorta via the right side.  Angiography was repeated.  This demonstrated no residual stenosis, no  dissection, and normal flow  distally.  The stents landed in the  relatively normal abdominal aorta just distal to the aneurysm but just  above the bifurcation.   The arteriotomies were both closed using StarClose devices.  Complete  hemostasis was obtained.  The patient was then transferred to holding  room in stable condition having tolerated the procedure well.   COMPLICATIONS:  None.   FINDINGS:  1. Heavily calcified and diffusely diseased infrarenal abdominal aorta      with small infrarenal aneurysm.  2. Right renal artery:  Heavily calcified but without significant      stenosis.  3. Left renal artery:  70% ostial stenosis.  4. Right leg:  50% ostial common iliac artery stenosis stented to no      residual.  The remainder of the common iliac is normal.  The       external iliac, common femoral, and internal iliac arteries are      normal.  The profunda is normal.  The SFA has a 20% stenosis      distally.  The popliteal is normal.  There is three-vessel runoff      to the foot.  5. Left leg:  70% ostial common iliac arteries which was stented to no      residual.  The remainder of the common iliac as well as external      iliac and internal iliac are normal.  The common fontanelle and      profunda are normal.  The SFA has mild disease distally (well under      20%).  There is three-vessel runoff to the foot.   IMPRESSION/RECOMMENDATIONS:  1. Successful stenting of the ostia of both common iliac arteries.      She should be maintained on aspirin indefinitely and Plavix for 30      days.  2. A 70% left renal artery stenosis:  Given normal renal function and      well controlled blood pressure, will manage conservatively for now.      Salvadore Farber, MD  Electronically Signed     WED/MEDQ  D:  06/12/2006  T:  06/12/2006  Job:  [id number]

## 2010-10-11 NOTE — Progress Notes (Signed)
[facility]                        PERIPHERAL VASCULAR OFFICE NOTE   KATHEY, SIMER                        MRN:          [id number]  DATE:09/10/2006                            DOB:          December 27, 1940    HISTORY OF PRESENT ILLNESS:  Ms. Leathers is a 70 year old status post  stenting above common iliac arteries on June 12, 2006.  Postprocedurally, her claudication on the right was actually worse.  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  I treated that with balloon  angioplasty.  Since then she is completely asymptomatic.  ABIs  postprocedure were 0.98 on the right and 1.1 on the left.   In the months since then she has remained completely asymptomatic.  She  walked several miles with a friend recently.   CURRENT MEDICATIONS:  1. Quinapril 20 mg twice daily.  2. Imdur 30 mg daily.  3. Aspirin 81 mg daily.  4. Simvastatin 80 mg daily.  5. Celexa 20 mg daily.  6. Calcium.  7. Vitamin D.  8. Fosamax 70 mg weekly.  9. Prilosec 20 mg daily.  10.Neurontin 300 mg daily.  11.HCTZ 12.5 mg daily.  12.Tylenol p.r.n.   PHYSICAL EXAMINATION:  She is generally well appearing, in no distress  with heart rate 73, blood pressure 132/74 and weight of 143 pounds.  She  has no jugular venous distention, thyromegaly or lymphadenopathy.  LUNGS:  Clear to auscultation.  She has a nondisplaced point of maximal  cardiac impulse.  There is a regular rate and rhythm without murmur,  rub, or gallop.  ABDOMEN:  Soft, nondistended, nontender.  There is no  hepatosplenomegaly.  Bowel sounds are normal.  There is no pulsatile  midline mass.  EXTREMITIES:  Warm without clubbing, cyanosis, edema, or ulceration.  Carotid pulse is 2+ bilaterally without bruit.  DP and PT pulses 2+  bilaterally.   IMPRESSION RECOMMENDATIONS:  1. Lower extremity vascular disease:  Normal arterial brachial  indices      and asymptomatic after bilateral common iliac artery stenting and      balloon angioplasty of the right common femoral artery.  Continue      exercise and aspirin.  2. Renal artery stenosis:  Ostial left renal artery stenosis 70%.  The      right had no stenosis.  Given good blood pressure control on two      medications and normal renal function, will not proceed with      revascularization.  Need to keep careful watch on renal function.      Check BMET today.  3. Hypercholesterolemia:  Due for study.  Will check today.  4. Abdominal aortic aneurysm:  In October, 2007, 2.7 cm.  Due for      followup in October, 2008.  5. Chronic obstructive pulmonary disease.  6. Hypertension:  Reasonable control.     Salvadore Farber, MD  Electronically Signed    WED/MedQ  DD: 09/10/2006  DT: 09/10/2006  Job #: [id number]

## 2010-10-11 NOTE — Progress Notes (Signed)
[facility]                        PERIPHERAL VASCULAR OFFICE NOTE   Andrea, [name]                        MRN:          [id number]  DATE:07/08/2006                            DOB:          1940-06-26    July 08, 2006   HISTORY OF PRESENT ILLNESS:  Andrea [name] is a 70 year old lady in whom I  stented both common iliac arteries on June 12, 2006.  Post procedure,  her claudication was actually worse on the right, though improved on the  left.  Post procedure ABI on the right was 0.63.  Repeat angiogram  showed a dissection in the right common femoral artery approximately 2  cm above the StarClose closure site.  Interestingly, there has been no  evidence of dissection on the preclosure angiogram.  We therefore  preceded to balloon angioplasty of this site on June 30, 2006.  Since then, her claudication has improved from 100 yards to  approximately a half mile walking on level ground.  The left leg is  asymptomatic.   CURRENT MEDICATIONS:  1. Quinapril 20 mg twice a day.  2. Imdur 30 mg per day.  3. Aspirin 81 mg per day.  4. Plavix 75 mg per day.  5. Simvastatin 80 mg per day.  6. Celesta 20 mg per day.  7. Calcium plus D.  8. Fosamax.  9. Prilosec.  10.Neurontin.  11.Hydrochlorothiazide 12.5 mg per day.  12.Tylenol.   PHYSICAL EXAMINATION:  GENERAL APPEARANCE:  She is generally well-  appearing in no distress.  VITAL SIGNS:  Heart rate 61, blood pressure 116/80, weight 145 pounds.  NECK:  She has no jugular venous distension, thyromegaly, no  lymphadenopathy.  LUNGS:  Clear to auscultation.  CARDIOVASCULAR:  She has a nondisplaced point of maximal cardiac  impulse.  There is a regular rate and rhythm without murmurs, rubs, or  gallops.  ABDOMEN:  Soft, nondistended and nontender.  There is no  hepatosplenomegaly.  Bowel sounds are normal.  EXTREMITIES:  Warm without clubbing, cyanosis, edema or ulceration.  The  femoral  pulse is 2+ bilaterally with a soft bruit on the right.  DP and  PT pulses are 1+ bilaterally.   Lower extremity Duplex examination performed today demonstrates ABI  improved from 0.63 to 0.98 on the right.  Left ABI is 1.1 and stable.   IMPRESSION/RECOMMENDATIONS:  Marked improvement in her right leg  claudication after balloon angioplasty of the dissection.  I expect this  to continue to heal.  I think some of her residual symptoms are due to  deconditioning.  Will see her back in six weeks' time.     Salvadore Farber, MD  Electronically Signed    WED/MedQ  DD: 07/08/2006  DT: 07/08/2006  Job #: [id number]

## 2010-10-11 NOTE — Progress Notes (Signed)
[facility]                        PERIPHERAL VASCULAR OFFICE NOTE   KHADIJATOU, BORAK                        MRN:          [id number]  DATE:06/25/2006                            DOB:          09-Jan-1941    HISTORY OF PRESENT ILLNESS:  Ms. Kneip is a 70 year old lady status  post bilateral common iliac artery stenting on June 12, 2006.  Both  arteriotomies were closed using StarClose devices.  Since the procedure,  her left leg claudication has resolved, however, she has new right leg  claudication.  Left ABI measured today is 1.2 but the right has dropped  from 1.0 to 0.63.  She has discomfort in her right leg when walking less  than 100 yards on level ground.  She finds this quite lifestyle  limiting.  The left leg is now asymptomatic.   PAST MEDICAL HISTORY:  1. Coronary artery disease status post CABG.  2. Peripheral arterial disease as above.  3. Hypertension.  4. Hypercholesterolemia.  5. A 2.7 x 2.7 cm abdominal aortic aneurysm.  6. COPD.  7. GERD.  8. Status post bilateral tubal ligation.  9. History of low back pain with a prior epidural block.  10.Lung nodule.   ALLERGIES:  PENICILLIN, CODEINE, CIPRO, DEMEROL, ERYTHROMYCIN,  SHELLFISH.  NORVASC CAUSES EDEMA.   CURRENT MEDICATIONS:  1. Quinapril 20 mg twice daily.  2. Imdur 20 mg daily.  3. Aspirin 81 mg daily.  4. Simvastatin 40 mg daily.  5. Celestone 20 mg daily.  6. Calcium.  7. Vitamin D.  8. Fosamax 70 mg weekly.  9. Prilosec 20 mg weekly.  10.Neurontin 100 mg daily.  11.Hydrochlorothiazide 12.5 mg daily.  12.Tylenol 1300 mg twice daily.  13.Plavix 75 mg daily.   SOCIAL HISTORY:  The patient is married.  Denied tobacco abuse.  A very  rare social alcohol use.   REVIEW OF SYSTEMS:  Negative in detail except as above.   FAMILY HISTORY:  Noncontributory.   PHYSICAL EXAMINATION:  She was generally well appearing in no distress  with a heart rate of 60.  Blood  pressure 124/80 on the left and 142/90  on the left.  She weighs 144 pounds.  HEENT:  Normal.  MUSCULOSKELETAL:  Normal.  SKIN:  Normal.  She has no jugular venous distention, thyromegaly, or lymphadenopathy.  LUNGS:  Clear to auscultation.  Respiratory effort is normal.  She has a nondisplaced point of maximal cardiac impulse.  There is a  regular rate and rhythm without murmur, rub, or gallop.  ABDOMEN:  Soft, nontender, nontender.  There is no hepatosplenomegaly.  Bowel sounds are normal.  EXTREMITIES:  Warm without clubbing, cyanosis, edema, or ulceration.  Carotid pulses 2+ bilaterally with a soft bruit on the right.  Femoral  pulses trace on the right and 2+ on the left.  DP and PT are 2+ on the  left and trace on the right.  She was alert and oriented x3 with normal affect and normal neurologic  exam.   IMPRESSION/RECOMMENDATIONS:  1. Duplex today shows patent iliac stents but severe stenosis in the  distal external iliac or proximal common femoral on the right.  It      is not clear if this is the site of the StarClose closure device.      I have recommended repeat angiography with an eye to balloon      angioplasty of this apparent sheath induced injury site.  The neo-      carina is fairly low so I think we can do it via the left-sided      axis rather than arm axis.  Continue medications with the exception      of holding the hydrochlorothiazide the morning of the procedure.  2. Renal artery stenosis:  Normal renal function and generally well      controlled blood pressure.  Manage conservatively for now.     Salvadore Farber, MD  Electronically Signed    WED/MedQ  DD: 06/25/2006  DT: 06/25/2006  Job #: [id number]

## 2010-10-11 NOTE — Assessment & Plan Note (Signed)
[facility]                              CARDIOLOGY OFFICE NOTE   DAISIE, HAFT                        MRN:          [id number]  DATE:03/18/2006                            DOB:          1940/11/05    Andrea [name] is a 70 year old female who has a history of coronary disease,  status post coronary bypassing graft, a small abdominal aortic aneurysm,  hypertension, hyperlipidemia, and COPD.  Since I last saw her there is no  dyspnea on exertion, no orthopnea, PND, pedal edema, presyncope or syncope.  She has had intermittent palpitations that last for minutes.  There are no  associated symptoms.  They are sudden in onset and also in discontinuation.  She has not had chest pain.  She does have some pain in her thighs with  ambulation.   Her medications at present include:  1. Imdur 30 mg p.o. daily.  2. Lipitor 40 mg p.o. daily.  3. Zocor 40 mg p.o. q.h.s.  4. Quinapril 10 mg p.o. b.i.d.  5. Amlodipine 10 mg daily.  6. Prilosec 20 mg p.o. daily.  7. Neurontin 100 mg p.o. q.h.s.  8. Aspirin 81 mg p.o. daily.  9. Calcium.  10.Acetaminophen.  11.Spiriva.   PHYSICAL EXAMINATION:  VITAL SIGNS:  Her physical exam today shows a blood  pressure of 132/78, a pulse of 61.  NECK:  Supple.  There is a soft right carotid bruit.  CHEST:  Clear.  CARDIOVASCULAR:  A regular rate and rhythm.  ABDOMEN:  No pulsatile mass.  EXTREMITIES:  Lower extremities show no edema.  She has 2+ femoral and  posterior tibial pulses.   Her electrocardiogram shows a sinus rhythm at a rate of 61.  There are  nonspecific T wave changes.   DIAGNOSES:  1. Coronary artery disease, status post coronary bypassing graft.  2. Peripheral vascular disease.  3. Hypertension.  4. Hyperlipidemia.  5. Small abdominal aortic aneurysm.  6. Chronic obstructive pulmonary disease.  7. Gastroesophageal reflux disease.   PLAN:  Ms. Alameda is complaining of thigh pain with ambulation,  but there  is no calf pain.  She had an ultrasound on March 11, 2006, which showed  stable dimensions of her abdominal aortic aneurysm at 2.7 x 2.7 cm.  We will  plan to follow that up in one year.  She also had moderate bilateral common  iliac stenoses and probable bilateral renal artery stenoses.  We will  schedule her for a CTA of her renals and iliac arteries.  If there is  significant stenosis, then we will refer to Dr. Samule Ohm for further therapy.  She is presently on two stains, and we have asked her to discontinue her  Lipitor.  I will check lipids and liver in 6 weeks and adjust her Zocor as  indicated.  We will continue with that if possible as there is a financial  problem with her Lipitor.  Her blood pressure is mildly elevated at home in  the morning, and I have asked her to increase her Quinapril to 20 mg p.o.  b.i.d.  We  will check a BMET in one week to follow her potassium and renal  function.  She is also complaining of palpitations intermittently.  We will  schedule her for a CardioNet monitor.  I will see her back in 6 weeks to  review the above information.  Note, we considered a beta blocker today but  apparently this has caused significant fatigue in the past.    ______________________________  Madolyn Frieze. Jens Som, MD, [facility]    BSC/MedQ  DD: 03/18/2006  DT: 03/19/2006  Job #: [id number]

## 2010-10-11 NOTE — Progress Notes (Signed)
[facility]                        PERIPHERAL VASCULAR OFFICE NOTE   Andrea [name], Andrea [name]                        MRN:          [id number]  DATE:06/03/2006                            DOB:          [birth year]    The patient returns for follow-up discussion after her exercise ABIs.   HISTORY OF PRESENT ILLNESS:  Andrea [name] is a 70 year old woman with  atherosclerotic peripheral arterial disease.  I met her a month ago for  evaluation of renal artery stenosis and this peripheral arterial  disease.  At that time, she complained of discomfort in both calves and  thighs when walking approximately 100 yards of level ground.  She finds  this substantially lifestyle limiting.  She has not had any rest pain or  tissue loss.   She has a small infrarenal abdominal aortic aneurysm measuring 2.7 x 2.7  cm.  Ultrasound to follow this up demonstrated severe stenosis, left  common iliac artery, and at least moderate stenosis of the right common  iliac artery with a peak systolic velocity on the left of 383 cm per  second and on the right of 222 cm per second.  In addition, limited  evaluation suggested bilateral renal artery stenosis.  Exercise ABIs  performed May 15, 2006 demonstrated resting ABIs to be normal at  1.2 on the right and 1.1 on the left.  However, with exertion, the right  ABI dropped to 0.73 recovering at four minutes, and the left dropped to  0.44, not recovering fully at 10 minutes.  Her symptoms have remained  stable since I last saw her.   PAST MEDICAL HISTORY:  1. Coronary artery disease, status post CABG.  2. Peripheral arterial disease.  3. Hypertension.  4. Hypercholesterolemia.  5. Abdominal aortic aneurysm, 2.7 x 2.7 cm.  6. COPD.  7. GERD.  8. Status post bilateral tubal ligation.  9. History of low back pain with prior epidural block.  10.Lung nodule.   ALLERGIES:  1. PENICILLIN.  2. CODEINE.  3. CIPRO.  4. DEMEROL.  5.  ERYTHROMYCIN.  6. SHELLFISH.  7. NORVASC (edema).   CURRENT MEDICATIONS:  1. Imdur 30 mg per day.  2. Aspirin 81 mg per day.  3. Prilosec 20 mg per day.  4. Tylenol 1500 mg twice per day.  5. Fosamax.  6. Neurontin 100 mg q.h.s.  7. Calcium.  8. Vitamin D.  9. Quinapril 20 mg twice per day.  10.HCTZ 12.5 mg per day.  11.Celexa 20 mg per day.  12.Zocor 40 mg per day.  13.Neurontin 300 mg q.h.s.   SOCIAL HISTORY:  The patient is married.  Denies tobacco abuse.  Very  rare social alcohol use.   REVIEW OF SYSTEMS:  Negative in detail except as above.   PHYSICAL EXAMINATION:  GENERAL:  She is generally well-appearing, in no  distress.  VITAL SIGNS:  Heart rate 61, blood pressure 116/80 on the left and  136/86 on the right.  Weight is 145 pounds.  NECK:  She has no jugular venous distention, thyromegaly, or  lymphadenopathy.  HEENT:  Normal.  MUSCULOSKELETAL:  Exam  is normal.  SKIN:  Exam is normal.  LUNGS:  Clear to auscultation.  Respiratory effort normal.  HEART:  She has a nondisplaced point of maximal cardiac impulse.  There  is a regular rate and rhythm without murmur, rub, or gallop.  ABDOMEN:  Soft, nondistended, nontender.  There is no  hepatosplenomegaly.  Bowel sounds are normal.  EXTREMITIES:  Warm without clubbing, cyanosis, edema, or ulceration.  Carotid pulses 2+ bilaterally with a soft bruit on the right.  Femoral  pulses 2+ on the right and 1+ on the left.  DP and PT pulses 1+  bilaterally.  NEUROLOGIC:  She is alert and oriented x3 with normal affect and normal  neurologic exam.   LABORATORY DATA:  CT angiogram performed March 25, 2006 demonstrated  bilateral ostial renal artery stenosis with question of fibromuscular  dysplasia more distally in the right mean renal artery.  Moderate  stenosis of the proximal left common iliac artery, mild atheromatous  change in the femoral popliteal system bilaterally with three vessel  runoff bilaterally.    IMPRESSION/RECOMMENDATIONS:  She clearly has a lifestyle limiting  claudication which appears to be due to her aortic iliac disease.  Will  proceed to angiography with an eye to percutaneous revascularization for  symptomatic benefit.  She feels that continued conservative management  is unacceptable from a lifestyle limitation viewpoint.   At the time of angiography, will also assess the renal arteries.  However, will not plan on any renal artery intervention at the time of  the procedure.     Salvadore Farber, MD  Electronically Signed    WED/MedQ  DD: 06/09/2006  DT: 06/09/2006  Job #: [id number]

## 2010-10-11 NOTE — Op Note (Signed)
Andrea [name], Andrea [name]               ACCOUNT NO.:  [id number]   MEDICAL RECORD NO.:  [id number]          PATIENT TYPE:  AMB   LOCATION:  SDS                          FACILITY:  [facility]   PHYSICIAN:  Salvadore Farber, MD  DATE OF BIRTH:  November 29, 1940   DATE OF PROCEDURE:  06/30/2006  DATE OF DISCHARGE:                               OPERATIVE REPORT   PROCEDURE:  Abdominal aortography, selective catheterization of the  right common femoral artery via contralateral leg approach, balloon  angioplasty of the right common femoral artery.   INDICATIONS:  Ms. Neubert is a 70 year old lady who underwent stenting  of both common iliac arteries on June 12, 2006.  Both arteriotomies  were closed using StarClose devices.  Since the procedure, her left leg  claudication has resolved.  However, she has new right leg claudication.  Right ABI has dropped from 1-0.63.  Duplex ultrasonography demonstrated  a new stenosis in the right common femoral artery.  She therefore  presents for repeat angiography and possible percutaneous  revascularization.   PROCEDURAL TECHNIQUE:  Underwent 1% lidocaine local anesthesia, a 5-  French sheath was placed in the left common femoral artery using  modified Seldinger technique.  A pigtail catheter was advanced over a  wire and positioned in the infrarenal abdominal aorta.  Abdominal  aortography was performed by power injection.  This demonstrated both  stents to be widely patent.  I then used a crossover catheter and  advanced a angled Glidewire through the stented segments, being careful  not to get underneath the stent strut.  Over this, I then advanced a end-  hole catheter into the external iliac artery on the right.  I pulled the  wire and then performed the angiography via the end-hole catheter.  This  demonstrated a 90% stenosis of the common femoral artery approximately 2  cm above the StarClose device.  There was an associated small dissection  flap  without associated dye staining.  I decided to proceed to  percutaneous revascularization.   Anticoagulation was initiated with heparin.  ACT was maintained at  greater than 250 seconds.  I advanced the Glidewire across the lesion  into the SFA.  Over this, I advanced the end-hole catheter and exchanged  for a Rosen wire.  With the [facility] wire thus in place, I exchanged the  left-sided sheath for an 8-French sheath.  I then advanced an 8-French  LIMA guiding catheter to the bifurcation and engaged the right common  iliac.  I then advanced a 5 x 20 mm Powerflex and positioned it across  the lesion.  I inflated it to 6 atmospheres for 30 seconds.  This  balloon was clearly undersized.  I then proceeded to up-size to a 7 x 20  mm Powerflex which I inflated to 6 atmospheres for 2 minutes.  Repeat  angiography demonstrated no residual stenosis but a residual dissection  flap without dye staining.  I then proceeded to additional 5-minute  inflation with the same balloon also at 6 atmospheres.  Final  angiography demonstrated no residual stenosis, but a residual  dissection.  However, there was no residual dye staining.  The absence  of dye staining as well as the occurrence apparently after angiography  at the completion of the prior procedure implies a retrograde  dissection.  I therefore felt that most likely the dissection would heal  optimally with the stenosis now relieved.  I therefore completed the  procedure removed the Rosen wire via the end-hole catheter and  transferred her to the holding room in stable condition having tolerated  the procedure well.   COMPLICATIONS:  None.   FINDINGS:  1. Small infrarenal abdominal aortic aneurysm.  2. Widely patent bilateral common iliac artery stents.  3. Focal 90% stenosis of the right common femoral artery 2 cm above      the StarClose device.  This was an associated dissection.  This      appears to be associated intramural hematoma  resulting in the      stenosis.  This improved with balloon angioplasty to no residual      stenosis, but a residual dissection flap.   IMPRESSION/RECOMMENDATIONS:  Successful balloon angioplasty of  dissection and associated intramural hematoma in the right common  femoral artery.  She should be maintained on aspirin and Plavix.  Will  discharge later today after sheath removal and bedrest.  Will check ABI  next week and follow-up with me thereafter.      Salvadore Farber, MD  Electronically Signed     WED/MEDQ  D:  06/30/2006  T:  06/30/2006  Job:  [id number]

## 2010-10-28 ENCOUNTER — Other Ambulatory Visit: Payer: Self-pay | Admitting: Internal Medicine

## 2010-10-28 ENCOUNTER — Other Ambulatory Visit: Payer: Medicare Other

## 2010-10-28 DIAGNOSIS — I10 Essential (primary) hypertension: Secondary | ICD-10-CM

## 2010-11-05 ENCOUNTER — Ambulatory Visit: Payer: Medicare Other | Admitting: Internal Medicine

## 2010-11-11 ENCOUNTER — Encounter: Payer: Self-pay | Admitting: Cardiovascular Disease

## 2010-11-13 ENCOUNTER — Ambulatory Visit: Payer: Medicare Other | Admitting: Internal Medicine

## 2010-11-18 ENCOUNTER — Other Ambulatory Visit (INDEPENDENT_AMBULATORY_CARE_PROVIDER_SITE_OTHER): Payer: Medicare Other

## 2010-11-18 DIAGNOSIS — I6529 Occlusion and stenosis of unspecified carotid artery: Secondary | ICD-10-CM

## 2010-11-18 DIAGNOSIS — I10 Essential (primary) hypertension: Secondary | ICD-10-CM

## 2010-11-18 LAB — BASIC METABOLIC PANEL
CO2: 32 mEq/L (ref 19–32)
Calcium: 9.8 mg/dL (ref 8.4–10.5)
Creatinine, Ser: 1 mg/dL (ref 0.4–1.2)
GFR: 60.05 mL/min (ref >60.00)
Sodium: 140 mEq/L (ref 135–145)

## 2010-11-19 ENCOUNTER — Ambulatory Visit: Payer: Medicare Other | Admitting: Family Medicine

## 2010-11-19 ENCOUNTER — Other Ambulatory Visit: Payer: Self-pay | Admitting: Internal Medicine

## 2010-11-19 NOTE — Telephone Encounter (Signed)
Rx refill sent to pharmacy. Patient is due for office visit

## 2010-11-21 ENCOUNTER — Other Ambulatory Visit: Payer: Self-pay | Admitting: *Deleted

## 2010-11-21 MED ORDER — ISOSORBIDE MONONITRATE ER 30 MG PO TB24: 30.0000 mg | ORAL_TABLET | Freq: Every day | ORAL | Status: DC

## 2010-11-21 NOTE — Telephone Encounter (Signed)
Patient called and left voice message requesting refill on Imdur, rx refill sent to mail order pharmacy

## 2010-11-28 NOTE — Procedures (Unsigned)
CAROTID DUPLEX EXAM  INDICATION:  Carotid disease.  HISTORY: Diabetes:  No Cardiac:  MI Hypertension:  Yes Smoking:  Previous Previous Surgery:  No CV History:  Currently asymptomatic Amaurosis Fugax No, Paresthesias No, Hemiparesis No                                      RIGHT             LEFT Brachial systolic pressure:         128               126 Brachial Doppler waveforms:         Normal            Normal Vertebral direction of flow:        Antegrade         Antegrade DUPLEX VELOCITIES (cm/sec) CCA peak systolic                   72                85 ECA peak systolic                   78                105 ICA peak systolic                   89                289 ICA end diastolic                   34                119 PLAQUE MORPHOLOGY:                  Heterogeneous     Heterogeneous PLAQUE AMOUNT:                      Moderate          Severe PLAQUE LOCATION:                    ICA/CCA           ICA/CCA  IMPRESSION: 1. No hemodynamically significant stenosis of the right coronary     artery with plaque formations as described above. 2. 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 3. An office appointment with myself was scheduled for 11/25/2010 at     my request.  ___________________________________________ V. Charlena Cross, MD  CH/MEDQ  D:  11/19/2010  T:  11/19/2010  Job:  [id number]

## 2010-11-29 ENCOUNTER — Other Ambulatory Visit: Payer: Self-pay | Admitting: *Deleted

## 2010-11-29 MED ORDER — ISOSORBIDE MONONITRATE ER 30 MG PO TB24: 30.0000 mg | ORAL_TABLET | Freq: Every day | ORAL | Status: DC

## 2010-12-02 ENCOUNTER — Ambulatory Visit (INDEPENDENT_AMBULATORY_CARE_PROVIDER_SITE_OTHER): Payer: Medicare Other | Admitting: Surgery

## 2010-12-02 DIAGNOSIS — I6529 Occlusion and stenosis of unspecified carotid artery: Secondary | ICD-10-CM

## 2010-12-03 ENCOUNTER — Ambulatory Visit (INDEPENDENT_AMBULATORY_CARE_PROVIDER_SITE_OTHER): Payer: Medicare Other | Admitting: Family Medicine

## 2010-12-03 ENCOUNTER — Encounter: Payer: Self-pay | Admitting: Family Medicine

## 2010-12-03 DIAGNOSIS — R5383 Other fatigue: Secondary | ICD-10-CM

## 2010-12-03 DIAGNOSIS — G4710 Hypersomnia, unspecified: Secondary | ICD-10-CM | POA: Insufficient documentation

## 2010-12-03 DIAGNOSIS — R5381 Other malaise: Secondary | ICD-10-CM

## 2010-12-03 LAB — CBC WITH DIFFERENTIAL/PLATELET
Basophils Relative: 0 % (ref 0–1)
Eosinophils Absolute: 0.2 10*3/uL (ref 0.0–0.7)
Hemoglobin: 13.9 g/dL (ref 12.0–15.0)
Lymphs Abs: 2.3 10*3/uL (ref 0.7–4.0)
MCH: 28 pg (ref 26.0–34.0)
Monocytes Relative: 6 % (ref 3–12)
Neutro Abs: 4.7 10*3/uL (ref 1.7–7.7)
Neutrophils Relative %: 62 % (ref 43–77)
Platelets: 198 10*3/uL (ref 150–400)
RBC: 4.97 MIL/uL (ref 3.87–5.11)

## 2010-12-03 LAB — TSH: TSH: 3.694 u[IU]/mL (ref 0.350–4.500)

## 2010-12-03 NOTE — Progress Notes (Signed)
OFFICE NOTE  12/03/2010  CC:  Chief Complaint  Patient presents with  . Hypothyroidism    4 MONTH FOLLOW UP     HPI:   Patient is a 70 y.o. Caucasian female who is here for f/u fatigue. Actually describes 1-2 yrs of excessive daytime sleepiness, hx of snoring but no known apnea. Reports that her ID physician tested her TSH early this year and it was a bit low.  Repeat by Dr. Artist Pais here after that was normal, so the plan was to repeat TSH again today.    Pertinent PMH:  Chronic/recurrent sinusitis Hx of abnl TSH Chronic fatigue HTN PAD COPD GERD  MEDS;   Outpatient Prescriptions Prior to Visit  Medication Sig Dispense Refill  . aspirin 81 MG tablet Take 81 mg by mouth daily.        . beclomethasone (QVAR) 80 MCG/ACT inhaler Inhale 2 puffs into the lungs 2 (two) times daily.        . Calcium Carbonate 1500 MG TABS Take 1 tablet by mouth 2 (two) times daily.        . diclofenac sodium (VOLTAREN) 1 % GEL Apply topically 3 (three) times daily.        Marland Kitchen esomeprazole (NEXIUM) 40 MG capsule Take 40 mg by mouth every morning before breakfast.        . fish oil-omega-3 fatty acids 1000 MG capsule Take 1 g by mouth daily.        Marland Kitchen HYDROcodone-acetaminophen (VICODIN) 5-500 MG per tablet Take 1 tablet by mouth 2 (two) times daily. For low back pain       . irbesartan-hydrochlorothiazide (AVALIDE) 150-12.5 MG per tablet TAKE 1 TABLET DAILY  90 tablet  0  . risedronate (ACTONEL) 150 MG tablet Take 150 mg by mouth every 30 (thirty) days. with water on empty stomach, nothing by mouth or lie down for next 30 minutes.       . rosuvastatin (CRESTOR) 40 MG tablet Take 40 mg by mouth daily.        . sertraline (ZOLOFT) 100 MG tablet Take 100 mg by mouth daily.        Marland Kitchen tiotropium (SPIRIVA) 18 MCG inhalation capsule Place 18 mcg into inhaler and inhale daily.        . isosorbide mononitrate (IMDUR) 30 MG 24 hr tablet Take 1 tablet (30 mg total) by mouth daily.  90 tablet  1   Review of Systems    Constitutional: Positive for fatigue (excessive daytime sleepiness: epworth score 7 today). Negative for fever, chills and appetite change.  HENT: Negative for ear pain, congestion, sore throat, neck stiffness and dental problem.   Eyes: Negative for discharge, redness and visual disturbance.  Respiratory: Negative for cough, chest tightness, shortness of breath and wheezing.   Cardiovascular: Negative for chest pain, palpitations and leg swelling.  Gastrointestinal: Negative for nausea, vomiting, abdominal pain, diarrhea and blood in stool.  Genitourinary: Negative for dysuria, urgency, frequency, hematuria, flank pain, difficulty urinating and menstrual problem.  Musculoskeletal: Negative for myalgias, back pain, joint swelling and arthralgias.  Skin: Negative for pallor and rash.  Neurological: Negative for dizziness, speech difficulty, weakness and headaches.  Hematological: Negative for adenopathy. Does not bruise/bleed easily.  Psychiatric/Behavioral: Negative for confusion, sleep disturbance (Sleeps 5-6 hours per night and has done this for years.) and dysphoric mood. The patient is not nervous/anxious.     PE: Blood pressure 130/84, pulse 59, height 5\' 2"  (1.575 m), weight 152 lb (68.947 kg). Gen:  Alert, well appearing.  Patient is oriented to person, place, time, and situation. No further exam today.  IMPRESSION AND PLAN:  Excessive somnolence disorder Order sleep study. Will also check CBC and TSH today.     FOLLOW UP:  Return in about 4 months (around 04/05/2011).

## 2010-12-03 NOTE — Assessment & Plan Note (Signed)
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  I initially saw her in December of 2011 at the request of Dr. Gala Romney.  At that time she had a bruit auscultated on physical examination which was followed up with duplex ultrasound that showed 60%- 79% stenosis on the left.  We have been following her with serial ultrasounds.  She remains asymptomatic.  Specifically she denies numbness or weakness in either extremity.  She denies slurred speech. She denies amaurosis fugax.  She recently had a duplex ultrasound which showed progression of her carotid disease to greater than 80% on the left.  The patient continues to be medically managed for her hypertension and hypercholesterolemia.  She does suffer from COPD.  She had a heart attack in 1994 and CABG in 1993 in [place].  She has also had femoral stents placed by Dr. Samule Ohm for claudication.  The patient states that she has had trouble with word finding since her CABG but no other neurologic deficit.  She also complains of being very fatigued.  She is in the process of undergoing a workup to determine the underlying etiology of this.  REVIEW OF SYSTEMS:  Negative for fevers, chills, weight gain or weight loss. VASCULAR:  Positive for pain in her legs with walking, positive for fatigue. All other review of systems are negative as documented in the encounter form.  PAST MEDICAL HISTORY:  Hypertension, hypercholesterolemia, coronary artery disease, peripheral vascular disease, COPD.  PAST SURGICAL HISTORY:  CABG x3 in 70, femoral stents by Dr. Samule Ohm, sinus surgery in 2010.  SOCIAL HISTORY:  She is married with 5 children.  She is retired.  Does not smoke, quit in 1994.  Occasional alcohol.  FAMILY HISTORY:  Positive for coronary artery disease in her  mother who had a heart attack at age 52, father who had coronary artery disease later in life, brother had heart disease at age 25.  ALLERGIES:  Cipro, penicillin, erythromycin, Demerol, codeine.  MEDICATIONS:  Please see medical record.  PHYSICAL EXAMINATION:  Vital signs:  Heart rate 54, blood pressure 137/66 in the right, 131/80 in the left, respiratory rate 16.  General: She is well-appearing, in no distress.  HEENT:  Within normal limits. Lungs:  Clear bilaterally.  No wheezes or rhonchi.  Cardiovascular: Regular rhythm.  Positive left carotid bruit.  Palpable femoral pulses. Abdomen:  Soft, nontender.  No pulsatile mass.  No hepatosplenomegaly. Musculoskeletal:  No major deformity.  Neurological:  No focal deficits or weakness.  Skin:  Without rash.  DIAGNOSTIC STUDIES:  I reviewed her ultrasound which shows greater than 80% left carotid stenosis.  No significant disease on the right by the most recent ultrasound.  However, ultrasound from [place] in December of 2009 showed 40%-59% right carotid stenosis.  ASSESSMENT AND PLAN:  Asymptomatic left carotid stenosis.  I discussed the risks and benefits of proceeding with correction of her carotid stenosis.  We discussed stenting as well as endarterectomy.  We have decided to proceed with carotid endarterectomy.  I discussed the risks including the risk of stroke, the risk of nerve injury as well as the postoperative course and the recovery time.  We have tentatively scheduled her operation  for Thursday August 9 as she has a trip planned the first week in August.  She would also like to have further explored the reason for her chronic fatigue.  She is being evaluated by Dr. Milinda Cave whom she sees tomorrow.    Jorge Ny, MD Electronically Signed  VWB/MEDQ  D:  12/02/2010  T:  12/03/2010  Job:  [id number]  cc:   Bevelyn Buckles. Bensimhon, MD Dr Milinda Cave

## 2010-12-03 NOTE — Assessment & Plan Note (Signed)
Order sleep study. Will also check CBC and TSH today.

## 2010-12-17 ENCOUNTER — Ambulatory Visit (HOSPITAL_BASED_OUTPATIENT_CLINIC_OR_DEPARTMENT_OTHER): Payer: Medicare Other | Attending: Family Medicine

## 2010-12-17 DIAGNOSIS — R259 Unspecified abnormal involuntary movements: Secondary | ICD-10-CM | POA: Insufficient documentation

## 2010-12-17 DIAGNOSIS — G4710 Hypersomnia, unspecified: Secondary | ICD-10-CM | POA: Insufficient documentation

## 2010-12-26 DIAGNOSIS — G4710 Hypersomnia, unspecified: Secondary | ICD-10-CM

## 2010-12-26 DIAGNOSIS — G4730 Sleep apnea, unspecified: Secondary | ICD-10-CM

## 2010-12-26 DIAGNOSIS — R259 Unspecified abnormal involuntary movements: Secondary | ICD-10-CM

## 2010-12-26 NOTE — Procedures (Signed)
NAMEBETSY, Andrea [name]               ACCOUNT NO.:  [id number]  MEDICAL RECORD NO.:  [id number]          PATIENT TYPE:  OUT  LOCATION:  SLEEP CENTER                 FACILITY:  [facility]  PHYSICIAN:  Barbaraann Share, MD,FCCPDATE OF BIRTH:  May 01[birth year] 1939  DATE OF STUDY:  12/17/2010                           NOCTURNAL POLYSOMNOGRAM  REFERRING PHYSICIAN:  Jeoffrey Massed, MD  INDICATION FOR STUDY:  Hypersomnia with sleep apnea.  EPWORTH SLEEPINESS SCORE:  14.  MEDICATIONS:  SLEEP ARCHITECTURE:  The patient had a total sleep time of 241 minutes with very little slow wave sleep and never achieved REM.  Sleep onset latency was prolonged at 78 minutes, and sleep efficiency was poor at 63%.  RESPIRATORY DATA:  The patient was found to have no obstructive apneas and only 3 obstructive hypopneas, giving her an apnea/hypopnea index of 0.7 events per hour.  The events she did have were not positional, and there was moderate snoring noted throughout.  The patient did not meet split night criteria secondary to her small numbers of events.  OXYGEN DATA:  There was O2 desaturation as low as 88% transiently.  CARDIAC DATA:  Rare PVC noted, but no clinically significant arrhythmias were seen.  MOVEMENT-PARASOMNIA:  The patient was found to have 56 periodic leg movements, with 10 per hour resulting in arousal or awakening.  There were no abnormal behaviors noted.  IMPRESSIONS-RECOMMENDATIONS: 1. Small numbers of obstructive events which do not meet the AHI     criteria for the obstructive sleep apnea syndrome. 2. The patient was noted to have very large numbers of nonspecific     arousals which resulted in significant sleep fragmentation.  Again,     there was no specific etiology for this noted, and would consider     possible chronic pain or anxiety. 3. Rare premature ventricular contraction noted, but no clinically     significant arrhythmias were seen. 4. Moderate numbers of leg jerks with  significant sleep disruption.     It is unclear whether this represents a primary movement disorder     of sleep, or whether the leg jerks were related to her sleep     fragmentation and     frequent arousals.  Clinical correlation is suggested, to see if     she may have a clinical history that is suggestive of the restless     legs syndrome.     Barbaraann Share, MD,FCCP Diplomate, American Board of Sleep Medicine Electronically Signed    KMC/MEDQ  D:  12/26/2010 13:52:14  T:  12/26/2010 23:00:30  Job:  [id number]

## 2011-01-01 ENCOUNTER — Other Ambulatory Visit: Payer: Self-pay | Admitting: Surgery

## 2011-01-01 ENCOUNTER — Ambulatory Visit (HOSPITAL_COMMUNITY)
Admission: RE | Admit: 2011-01-01 | Discharge: 2011-01-01 | Disposition: A | Discharge status: Home / Self Care | Payer: Medicare Other | Source: Ambulatory Visit | Attending: Surgery | Admitting: Surgery

## 2011-01-01 ENCOUNTER — Telehealth: Payer: Self-pay | Admitting: Internal Medicine

## 2011-01-01 ENCOUNTER — Encounter (HOSPITAL_COMMUNITY)
Admission: RE | Admit: 2011-01-01 | Discharge: 2011-01-01 | Disposition: A | Discharge status: Home / Self Care | Payer: Medicare Other | Source: Ambulatory Visit | Attending: Surgery | Admitting: Surgery

## 2011-01-01 DIAGNOSIS — J449 Chronic obstructive pulmonary disease, unspecified: Secondary | ICD-10-CM | POA: Insufficient documentation

## 2011-01-01 DIAGNOSIS — I6529 Occlusion and stenosis of unspecified carotid artery: Secondary | ICD-10-CM

## 2011-01-01 DIAGNOSIS — I10 Essential (primary) hypertension: Secondary | ICD-10-CM | POA: Insufficient documentation

## 2011-01-01 DIAGNOSIS — J4489 Other specified chronic obstructive pulmonary disease: Secondary | ICD-10-CM | POA: Insufficient documentation

## 2011-01-01 DIAGNOSIS — Z01818 Encounter for other preprocedural examination: Secondary | ICD-10-CM | POA: Insufficient documentation

## 2011-01-01 LAB — URINALYSIS, ROUTINE W REFLEX MICROSCOPIC
Nitrite: NEGATIVE
Specific Gravity, Urine: 1.015 (ref 1.005–1.030)
pH: 6 (ref 5.0–8.0)

## 2011-01-01 LAB — COMPREHENSIVE METABOLIC PANEL
ALT: 13 U/L (ref 0–35)
AST: 17 U/L (ref 0–37)
CO2: 31 mEq/L (ref 19–32)
Chloride: 100 mEq/L (ref 96–112)
GFR calc non Af Amer: >60 mL/min (ref >60)
Potassium: 4.1 mEq/L (ref 3.5–5.1)
Sodium: 139 mEq/L (ref 135–145)
Total Bilirubin: 0.4 mg/dL (ref 0.3–1.2)

## 2011-01-01 LAB — CBC
MCV: 84 fL (ref 78.0–100.0)
Platelets: 174 10*3/uL (ref 150–400)
RBC: 4.95 MIL/uL (ref 3.87–5.11)
WBC: 7.2 10*3/uL (ref 4.0–10.5)

## 2011-01-01 LAB — SURGICAL PCR SCREEN: Staphylococcus aureus: POSITIVE — AB

## 2011-01-01 LAB — APTT: aPTT: 30 seconds (ref 24–37)

## 2011-01-01 LAB — ABO/RH: ABO/RH(D): A POS

## 2011-01-01 NOTE — Telephone Encounter (Signed)
Stress,LOV faxed to [facility] @ [phone]  01/01/11/km

## 2011-01-02 ENCOUNTER — Other Ambulatory Visit: Payer: Self-pay | Admitting: Surgery

## 2011-01-02 ENCOUNTER — Inpatient Hospital Stay (HOSPITAL_COMMUNITY)
Admission: RE | Admit: 2011-01-02 | Discharge: 2011-01-03 | DRG: 039 | Disposition: A | Discharge status: Home / Self Care | Payer: Medicare Other | Source: Ambulatory Visit | Attending: Surgery | Admitting: Surgery

## 2011-01-02 DIAGNOSIS — J4489 Other specified chronic obstructive pulmonary disease: Secondary | ICD-10-CM | POA: Diagnosis present

## 2011-01-02 DIAGNOSIS — Z951 Presence of aortocoronary bypass graft: Secondary | ICD-10-CM

## 2011-01-02 DIAGNOSIS — Z7982 Long term (current) use of aspirin: Secondary | ICD-10-CM

## 2011-01-02 DIAGNOSIS — E7800 Pure hypercholesterolemia, unspecified: Secondary | ICD-10-CM | POA: Diagnosis present

## 2011-01-02 DIAGNOSIS — I6529 Occlusion and stenosis of unspecified carotid artery: Principal | ICD-10-CM | POA: Diagnosis present

## 2011-01-02 DIAGNOSIS — Z79899 Other long term (current) drug therapy: Secondary | ICD-10-CM

## 2011-01-02 DIAGNOSIS — I739 Peripheral vascular disease, unspecified: Secondary | ICD-10-CM | POA: Diagnosis present

## 2011-01-02 DIAGNOSIS — I2510 Atherosclerotic heart disease of native coronary artery without angina pectoris: Secondary | ICD-10-CM | POA: Diagnosis present

## 2011-01-02 DIAGNOSIS — I10 Essential (primary) hypertension: Secondary | ICD-10-CM | POA: Diagnosis present

## 2011-01-02 DIAGNOSIS — J449 Chronic obstructive pulmonary disease, unspecified: Secondary | ICD-10-CM | POA: Diagnosis present

## 2011-01-02 HISTORY — PX: CAROTID ENDARTERECTOMY: SUR193

## 2011-01-02 LAB — URINALYSIS, ROUTINE W REFLEX MICROSCOPIC
Ketones, ur: NEGATIVE mg/dL
Leukocytes, UA: NEGATIVE
Nitrite: NEGATIVE
Specific Gravity, Urine: 1.013 (ref 1.005–1.030)
Urobilinogen, UA: 0.2 mg/dL (ref 0.0–1.0)
pH: 6 (ref 5.0–8.0)

## 2011-01-02 LAB — URINE MICROSCOPIC-ADD ON

## 2011-01-03 ENCOUNTER — Encounter: Payer: Self-pay | Admitting: Family Medicine

## 2011-01-03 ENCOUNTER — Emergency Department (HOSPITAL_COMMUNITY): Payer: Medicare Other

## 2011-01-03 ENCOUNTER — Observation Stay (HOSPITAL_COMMUNITY)
Admission: EM | Admit: 2011-01-03 | Discharge: 2011-01-04 | Disposition: A | Discharge status: Home / Self Care | Payer: Medicare Other | Attending: Vascular Surgery | Admitting: Vascular Surgery

## 2011-01-03 DIAGNOSIS — I2510 Atherosclerotic heart disease of native coronary artery without angina pectoris: Secondary | ICD-10-CM | POA: Insufficient documentation

## 2011-01-03 DIAGNOSIS — J4489 Other specified chronic obstructive pulmonary disease: Secondary | ICD-10-CM | POA: Insufficient documentation

## 2011-01-03 DIAGNOSIS — R5082 Postprocedural fever: Principal | ICD-10-CM | POA: Insufficient documentation

## 2011-01-03 DIAGNOSIS — J449 Chronic obstructive pulmonary disease, unspecified: Secondary | ICD-10-CM | POA: Insufficient documentation

## 2011-01-03 DIAGNOSIS — I10 Essential (primary) hypertension: Secondary | ICD-10-CM | POA: Insufficient documentation

## 2011-01-03 DIAGNOSIS — I739 Peripheral vascular disease, unspecified: Secondary | ICD-10-CM | POA: Insufficient documentation

## 2011-01-03 LAB — COMPREHENSIVE METABOLIC PANEL
ALT: 12 U/L (ref 0–35)
Albumin: 3.5 g/dL (ref 3.5–5.2)
Alkaline Phosphatase: 59 U/L (ref 39–117)
BUN: 10 mg/dL (ref 6–23)
Chloride: 99 mEq/L (ref 96–112)
GFR calc Af Amer: >60 mL/min (ref >60)
Glucose, Bld: 109 mg/dL — ABNORMAL HIGH (ref 70–99)
Potassium: 4.1 mEq/L (ref 3.5–5.1)
Sodium: 137 mEq/L (ref 135–145)
Total Bilirubin: 0.5 mg/dL (ref 0.3–1.2)
Total Protein: 6.7 g/dL (ref 6.0–8.3)

## 2011-01-03 LAB — DIFFERENTIAL
Basophils Absolute: 0 10*3/uL (ref 0.0–0.1)
Eosinophils Relative: 0 % (ref 0–5)
Lymphocytes Relative: 12 % (ref 12–46)
Lymphs Abs: 1 10*3/uL (ref 0.7–4.0)
Neutro Abs: 7.6 10*3/uL (ref 1.7–7.7)
Neutrophils Relative %: 84 % — ABNORMAL HIGH (ref 43–77)

## 2011-01-03 LAB — URINE MICROSCOPIC-ADD ON

## 2011-01-03 LAB — CBC
HCT: 32.6 % — ABNORMAL LOW (ref 36.0–46.0)
HCT: 34.5 % — ABNORMAL LOW (ref 36.0–46.0)
MCH: 27.6 pg (ref 26.0–34.0)
MCV: 84.9 fL (ref 78.0–100.0)
MCV: 85.2 fL (ref 78.0–100.0)
Platelets: 123 10*3/uL — ABNORMAL LOW (ref 150–400)
RBC: 3.84 MIL/uL — ABNORMAL LOW (ref 3.87–5.11)
RBC: 4.05 MIL/uL (ref 3.87–5.11)
RDW: 15.1 % (ref 11.5–15.5)
WBC: 9.1 10*3/uL (ref 4.0–10.5)

## 2011-01-03 LAB — BASIC METABOLIC PANEL
BUN: 10 mg/dL (ref 6–23)
CO2: 29 mEq/L (ref 19–32)
Calcium: 8.8 mg/dL (ref 8.4–10.5)
Chloride: 104 mEq/L (ref 96–112)
Creatinine, Ser: 0.73 mg/dL (ref 0.50–1.10)

## 2011-01-03 LAB — URINE CULTURE
Colony Count: 50000
Culture  Setup Time: 201208091337

## 2011-01-03 LAB — URINALYSIS, ROUTINE W REFLEX MICROSCOPIC
Bilirubin Urine: NEGATIVE
Glucose, UA: NEGATIVE mg/dL
Ketones, ur: NEGATIVE mg/dL
Protein, ur: NEGATIVE mg/dL
pH: 6 (ref 5.0–8.0)

## 2011-01-03 LAB — POCT I-STAT, CHEM 8
Chloride: 98 mEq/L (ref 96–112)
HCT: 34 % — ABNORMAL LOW (ref 36.0–46.0)
Hemoglobin: 11.6 g/dL — ABNORMAL LOW (ref 12.0–15.0)
Potassium: 4.2 mEq/L (ref 3.5–5.1)
Sodium: 138 mEq/L (ref 135–145)

## 2011-01-04 LAB — URINE CULTURE: Culture  Setup Time: 201208102018

## 2011-01-09 LAB — CULTURE, BLOOD (ROUTINE X 2)
Culture  Setup Time: 201208102344
Culture: NO GROWTH

## 2011-01-20 ENCOUNTER — Ambulatory Visit (INDEPENDENT_AMBULATORY_CARE_PROVIDER_SITE_OTHER): Payer: Medicare Other | Admitting: Surgery

## 2011-01-20 ENCOUNTER — Encounter: Payer: Self-pay | Admitting: Surgery

## 2011-01-20 VITALS — BP 120/76 | HR 64 | Resp 16 | Ht 62.0 in | Wt 150.8 lb

## 2011-01-20 DIAGNOSIS — I6529 Occlusion and stenosis of unspecified carotid artery: Secondary | ICD-10-CM

## 2011-01-20 NOTE — Progress Notes (Signed)
Andrea [name] comes back today for followup. She is status post left carotid endarterectomy for asymptomatic stenosis. Intraoperative findings included greater than 90% stenosis with ruptured plaque. After discharge the patient didn't come back to the ER with a temperature of 101.5. She was readmitted fever workup was negative this was felt to be due to atelectasis and she was sent home. She is back today for followup. She does complain of some discomfort with eating in the superior aspect of her wound. She denies having any neurologic symptoms.  On examination the left carotid incision is well-healed there is no drainage there is no evidence of infection. Cranial nerves are grossly intact. She has equal strength bilaterally she has a normal smile tongue is midline  I will see the patient back in 6 months with a repeat carotid ultrasound

## 2011-01-23 NOTE — H&P (Signed)
  Andrea [name], Andrea [name]               ACCOUNT NO.:  [id number]  MEDICAL RECORD NO.:  [id number]  LOCATION:  2027                         FACILITY:  [facility]  PHYSICIAN:  Janetta Hora. Fields, MD  DATE OF BIRTH:  [birth year]  DATE OF ADMISSION:  01/03/2011 DATE OF DISCHARGE:                             HISTORY & PHYSICAL   REASON FOR ADMISSION:  Postoperative fever.  HISTORY OF PRESENT ILLNESS:  The patient is a 70 year old female who was discharged to home earlier today approximately 10 hours ago.  The patient was noted to have fever of 101.5 at home.  She returned to the emergency room.  She had uneventful carotid endarterectomy by Dr. Myra Gianotti yesterday.  She denies any dysuria, cough, nausea, vomiting, or neurologic symptoms.  PAST MEDICAL HISTORY: 1. Coronary artery disease. 2. Hypertension. 3. Elevated cholesterol. 4. COPD. 5. Peripheral arterial disease.  PAST SURGICAL HISTORY:  Coronary artery bypass, carotid endarterectomy.  MEDICATIONS:  Vicodin, aspirin, Avalide, calcium carbonate, Crestor, fish oil, Imdur, Nexium, Spiriva, Tylenol, Voltaren, and Zoloft.  ALLERGIES:  CIPRO causes a rash, PENICILLIN causes a rash, erythromycin causes tongue to burn; however, the patient states she cannot take KEFLEX.  SOCIAL HISTORY:  The patient is a former tobacco user, quit in 1994.  PHYSICAL EXAMINATION:  VITAL SIGNS:  Blood pressure 96/58, heart rate 68, temperature 99.9, respirations 16, saturation 98% on 2 L oxygen. GENERAL:  White female, in no acute distress.  Alert and oriented x3. HEENT:  Unremarkable. NECK:  Left neck incision is healing well.  There is no hematoma. ABDOMEN:  Soft, nontender.  No masses. NEURO:  Upper extremity and lower extremity strength is 5/5 and symmetric.  ASSESSMENT: 1. Postoperative fever without leukocytosis.  The patient's white     blood cell count is normal during the     hospitalization this morning.  Most likely her fever is related to  atelectasis.  We will check urinalysis. 2. We will give her incentive spirometer, she will be admitted to     2000, most likely should be able to be discharged within the next     24 hours.     Janetta Hora. Fields, MD     CEF/MEDQ  D:  01/03/2011  T:  01/03/2011  Job:  [id number]  Electronically Signed by Andrea Bruns MD on 01/23/2011 06:27:53 PM

## 2011-01-23 NOTE — Discharge Summary (Signed)
  Andrea [name], Andrea [name]               ACCOUNT NO.:  [id number]  MEDICAL RECORD NO.:  [id number]  LOCATION:  3316                         FACILITY:  [facility]  PHYSICIAN:  Juleen China IV, MDDATE OF BIRTH:  December 11, 1940  DATE OF ADMISSION:  01/02/2011 DATE OF DISCHARGE:  01/03/2011                              DISCHARGE SUMMARY   ADMISSION DIAGNOSIS:  Asymptomatic left carotid artery stenosis.  HISTORY OF PRESENT ILLNESS:  This is a 70 year old female with asymptomatic left carotid stenosis and is evident, followed by Doppler ultrasound which has now progressed to greater than 80%.  She is set up for carotid endarterectomy.  HOSPITAL COURSE:  The patient was admitted to the hospital and taken to the operating room on January 02, 2011, where she underwent a left carotid endarterectomy with bovine patch angioplasty.  She tolerated procedure well and was transported to the recovery room in satisfactory condition. Postoperatively in the recovery room, her neuro status was intact with no focal defects.  By postoperative day #1, she is doing well.  Her neuro exam is intact again with no focal defects.  Her incision is clean, dry and intact.  She states that she has a sore throat, but no problems swallowing.  The remainder of her course included increasing ambulation as well as increasing intake of solids without difficulty.  DISCHARGE INSTRUCTIONS:  She is discharged home with extensive instructions on wound care and progressive ambulation.  She is instructed not to drive or perform any heavy lifting for 1 month.  DISCHARGE DIAGNOSES: 1. Left carotid artery stenosis.     a.     Status post left carotid endarterectomy, January 02, 2011. 2. Coronary artery disease.     a.     Status post coronary artery bypass graft x3 in 1999. 3. History of femoral stents by Dr. Samule Ohm. 4. Sinus surgery in 2010. 5. Hypertension. 6. Hypercholesterolemia. 7. Peripheral vascular disease. 8. Chronic  obstructive pulmonary disease.  DISCHARGE MEDICATIONS: 1. Vicodin 5/500 one p.o. q.6 h p.r.n. pain, #30, no refill. 2. Aspirin 81 mg p.o. daily. 3. Avalide 150/12.5 mg p.o. daily. 4. Calcium carbonate 1500 mg p.o. b.i.d. 5. Crestor 40 mg p.o. daily. 6. Fish oil 1000 mg p.o. daily. 7. Imdur 30 mg p.o. daily. 8. Nexium 40 mg p.o. daily. 9. Spiriva 18 mcg 1 p.o. daily p.r.n. 10.Tylenol Arthritis 650 mg 2 tablets p.o. b.i.d.     a.     She is to reduce the dose of this while taking Vicodin. 11.Voltaren 1 application transdermally t.i.d. p.r.n. 12.Zoloft 100 mg p.o. daily  FOLLOWUP:  The patient is to follow up with Dr. Myra Gianotti in 2 weeks.     Newton Pigg, PA   ______________________________ V. Charlena Cross, MD    SE/MEDQ  D:  01/03/2011  T:  01/03/2011  Job:  [id number]  cc:   Bevelyn Buckles. Bensimhon, MD Jeoffrey Massed, MD  Electronically Signed by Newton Pigg PA on 01/06/2011 01:32:13 PM Electronically Signed by Arelia Longest IV MD on 01/23/2011 12:02:12 AM

## 2011-01-23 NOTE — Op Note (Signed)
Andrea [name], Andrea [name]               ACCOUNT NO.:  [id number]  MEDICAL RECORD NO.:  [id number]  LOCATION:  3316                         FACILITY:  [facility]  PHYSICIAN:  Juleen China IV, MDDATE OF BIRTH:  03-02-1941  DATE OF PROCEDURE:  01/02/2011 DATE OF DISCHARGE:                              OPERATIVE REPORT   PREOPERATIVE DIAGNOSIS:  Asymptomatic left carotid stenosis.  POSTOPERATIVE DIAGNOSIS:  Asymptomatic left carotid stenosis.  PROCEDURE PERFORMED:  Left carotid endarterectomy with bovine pericardial patch angioplasty.  SURGEON: 1. Charlena Cross, MD  ASSISTANT:  Newton Pigg, PA  ANESTHESIA:  General.  BLOOD LOSS:  150 mL  FINDINGS:  95% stenosis with ruptured plaque.  INDICATIONS:  This is a 70 year old gentleman with asymptomatic left carotid stenosis that is evident from followed by Doppler ultrasound is now progressed greater than 80%.  She comes in today for operation. Risks and benefits were discussed with the patient.  Informed consent was signed.  PROCEDURE:  The patient was identified in the holding and taken to room 8, placed supine on the table.  General anesthesia was administered. The patient was prepped and draped in usual fashion.  A time-out was called.  Antibiotics were given.  Incision was made on the anterior border of the left sternocleidomastoid.  Cautery was used to divide the subcutaneous tissue.  The platysma muscles were divided with cautery. The common facial vein was identified and I then divided between 2-0 silk ties and metal clips.  I then dissected down sharply to the common carotid artery.  The vagus nerve was slightly anterior on the lateral side.  It was visualized to protect throughout the case.  Umbilical tape was placed around the common carotid artery.  Next, cephalad dissection was performed, found the external carotid artery was located anterior to the internal carotid artery.  This was circumferentially isolated  and then reflected medially back to its normal position in the process.  I did divide the superior thyroid artery between 2-0 silk ties to help with exposure.  Next, the internal carotid artery was dissected free. This was below the hypoglossal nerve which was not in the operative field.  An umbilical tape was passed.  At this point, I told Anesthesia to give 6000 units of heparin.  I then prepared the patch and shunt. Approximately 5 minutes later, the internal carotid artery was occluded with a baby Gregory clamp, the external carotid artery was occluded with a baby Gregory clamp and the common carotid artery was occluded with a peripheral DeBakey.  #11 blade was used to make an arteriotomy which was extended along the anterolateral border of the common and internal carotid artery.  I encountered a 95% stenosis with a ruptured plaque at the bifurcation.  I then placed a 10-French shunt.  I noticed that there was thrombus forming within the endarterectomized bed, and therefore, I gave an additional 2000 units of heparin.  At this point in time, I found out that the initial 6000 units of heparin had not been administered, that it was the Anesthesia did not hear me when I mentioned that.  I therefore removed the shunt and appropriately flushed the common, internal and external carotid  arteries.  We gave a full bolus of heparin.  About 3 minutes later, I reinserted the shunt.  I then performed endarterectomy using a Kleinert-Kutz elevator.  Eversion endarterectomy was performed at the external carotid artery.  All potential embolic debris was removed.  There was a piece of plaque posterior to the shunt which I ended up removing the shunt for to remove this.  There was good backbleeding, so I did not replace the shunt. Patch angioplasty was then performed using a running 6-0 Prolene.  Prior to completion, the artery was irrigated with heparinized saline.  The common, internal and external  carotid arteries were all appropriately flushed.  The anastomosis was completed.  One repair stitch was needed for hemostasis.  Handheld Doppler was used to evaluate the signals in the common, internal, external carotid arteries, and all had appropriate signals.  I then reversed the heparin with 50 mg of protamine.  Once hemostasis was satisfactory, the carotid sheath was reapproximated with 3-0 Vicryl.  The platysma muscle was closed with 3-0 Vicryl.  The skin was closed with running 4-0 Vicryl.  Dermabond was placed on the wound.     Jorge Ny, MD     VWB/MEDQ  D:  01/02/2011  T:  01/02/2011  Job:  [id number]  Electronically Signed by Arelia Longest IV MD on 01/23/2011 12:02:15 AM

## 2011-02-20 LAB — CBC
HCT: 44
Hemoglobin: 14.7
Platelets: 169
WBC: 7

## 2011-02-20 LAB — BASIC METABOLIC PANEL
BUN: 15
GFR calc Af Amer: >60
GFR calc non Af Amer: >60
Potassium: 4.4
Sodium: 138

## 2011-02-26 ENCOUNTER — Telehealth: Payer: Self-pay | Admitting: Internal Medicine

## 2011-02-26 ENCOUNTER — Telehealth (HOSPITAL_COMMUNITY): Payer: Self-pay | Admitting: *Deleted

## 2011-02-26 NOTE — Telephone Encounter (Signed)
Debbie from [facility] called, pt needs surgcial clearance for sinus surgery tomorrow.

## 2011-02-26 NOTE — Telephone Encounter (Signed)
Spoke w/pt she has had no CP or issues since she was seen last, advised ok for surgery adn note faxed

## 2011-02-26 NOTE — Telephone Encounter (Signed)
Pt cleared and note faxed

## 2011-02-26 NOTE — Telephone Encounter (Signed)
Patient having surgery on 02/27/11. Clearance form fax over on 02/25/11. pls advise.

## 2011-02-26 NOTE — Telephone Encounter (Signed)
i have not seen her since 2/12. However sinus surgery should be low risk She had negative Myoview 2010. If not having CP can proceed to surgery without further cardiac testing

## 2011-02-26 NOTE — Telephone Encounter (Signed)
[facility] office calls today for surgical clearance. Pt to have sinus surgery tomorrow. (02/27/11).   They would like letter faxed to [phone] I will forward this to [facility] & Dr. Gala Romney. Mylo Red RN

## 2011-02-27 ENCOUNTER — Ambulatory Visit: Payer: Self-pay | Admitting: Otolaryngology

## 2011-03-06 NOTE — Telephone Encounter (Signed)
Ok to proceed. 

## 2011-03-27 DIAGNOSIS — I214 Non-ST elevation (NSTEMI) myocardial infarction: Secondary | ICD-10-CM

## 2011-03-27 HISTORY — DX: Non-ST elevation (NSTEMI) myocardial infarction: I21.4

## 2011-04-02 ENCOUNTER — Ambulatory Visit: Payer: Medicare Other | Admitting: Internal Medicine

## 2011-04-03 ENCOUNTER — Other Ambulatory Visit: Payer: Self-pay | Admitting: Internal Medicine

## 2011-04-03 NOTE — Telephone Encounter (Signed)
Rx refill sent to pharmacy. 

## 2011-04-07 ENCOUNTER — Ambulatory Visit: Payer: Medicare Other | Admitting: Internal Medicine

## 2011-04-08 ENCOUNTER — Ambulatory Visit: Payer: Medicare Other | Admitting: Internal Medicine

## 2011-04-08 DIAGNOSIS — Z0289 Encounter for other administrative examinations: Secondary | ICD-10-CM

## 2011-04-10 ENCOUNTER — Ambulatory Visit: Payer: Medicare Other | Admitting: Internal Medicine

## 2011-04-14 ENCOUNTER — Telehealth: Payer: Self-pay | Admitting: Internal Medicine

## 2011-04-14 ENCOUNTER — Encounter: Payer: Self-pay | Admitting: Internal Medicine

## 2011-04-14 ENCOUNTER — Ambulatory Visit (INDEPENDENT_AMBULATORY_CARE_PROVIDER_SITE_OTHER): Payer: Medicare Other | Admitting: Internal Medicine

## 2011-04-14 VITALS — BP 108/70 | HR 64 | Temp 98.3°F | Resp 18 | Ht 62.0 in | Wt 149.0 lb

## 2011-04-14 DIAGNOSIS — Z79899 Other long term (current) drug therapy: Secondary | ICD-10-CM

## 2011-04-14 DIAGNOSIS — E785 Hyperlipidemia, unspecified: Secondary | ICD-10-CM

## 2011-04-14 DIAGNOSIS — J449 Chronic obstructive pulmonary disease, unspecified: Secondary | ICD-10-CM

## 2011-04-14 DIAGNOSIS — I10 Essential (primary) hypertension: Secondary | ICD-10-CM

## 2011-04-14 MED ORDER — ALBUTEROL 90 MCG/ACT IN AERS: 2.0000 | INHALATION_SPRAY | Freq: Four times a day (QID) | RESPIRATORY_TRACT | Status: DC | PRN

## 2011-04-14 MED ORDER — ROSUVASTATIN CALCIUM 40 MG PO TABS: 40.0000 mg | ORAL_TABLET | Freq: Every day | ORAL | Status: DC

## 2011-04-14 NOTE — Telephone Encounter (Signed)
SHE WILL BE OUT OF TOWN FOR SEVERAL WEEKS PRIOR TO HER dECEMBER 24TH 1 MONTH FOLLOW UP ON HER CHECK OUT SHEET DR HODGIN PUT FASTING LIPID.  HE TOLD HER HE DID NOT NEED TO DO THIS AS SHE HAS NOT BEEN ON CRESTOR FOR 6 WEEKS YET.  PLEASE UPDATE WHAT HE WANTS DONE AND LET THE PATIENT KNOW  SHE WILL NEED TO COME FOR LABS ON December 3RD OR 4TH

## 2011-04-14 NOTE — Patient Instructions (Signed)
Please schedule fasting lipid profile prior to next visit 272.4

## 2011-04-15 NOTE — Telephone Encounter (Signed)
Lab order entered for December 2012.

## 2011-04-16 NOTE — Assessment & Plan Note (Signed)
Normotensive and stable. Continue current regimen. Monitor bp as outpt and followup in clinic as scheduled. Obtain cbc and chem7 

## 2011-04-16 NOTE — Assessment & Plan Note (Signed)
Obtain lft. Schedule lipid profile prior to next visit. Resume crestor.

## 2011-04-16 NOTE — Assessment & Plan Note (Signed)
Change spiriva to symbicort 80 -sample provided. Begin albuterol mdi prn. Followup if no improvement or worsening.

## 2011-04-16 NOTE — Progress Notes (Signed)
  Subjective:    [name], female    DOB: 24-May-1941, 70 y.o.   MRN: [id number]  HPI Pt presents to clinic for followup of multiple medical problems. Notes chronic chest congestion and intermittent wheezing and dyspnea. No chest pain, f/c or leg swelling. Doesn't feel spiriva is adequate. No alleviating or exacerbating factors. Does not have rescue inhaler. Also notes out of statin for ~3wks. Tolerates without myalgia or abn lft. bp reviewed normotensive. No other complaints.  Past Medical History  Diagnosis Date  . COPD (chronic obstructive pulmonary disease)   . Cerebrovascular disease 01/2009    carotid u/s  R 0-39%   L 60-79%  . CAD (coronary artery disease)     s/CABG  Myoview normal 3/10  . Depression   . GERD (gastroesophageal reflux disease)   . Hypertension   . Hyperlipidemia   . Osteoporosis   . PVD (peripheral vascular disease)   . Dizziness   . AAA (abdominal aortic aneurysm) 01/2009    AAA (2.8 x 3.0)  moderate RAS (left); 2.7 x 2.7 cm (07/11/05)   Past Surgical History  Procedure Date  . Coronary artery bypass graft   . Tubal ligation   . Abdominal aortic aneurysm repair   . Femoral artery stent   . Closed reduction nasal fracture 11/2007  . Carotid endarterectomy 01/02/11    left    reports that she quit smoking about 18 years ago. Her smoking use included Cigarettes. She has a 20 pack-year smoking history. She has never used smokeless tobacco. She reports that she drinks alcohol. Her drug history not on file. family history includes Cancer in her sister; Colon cancer (age of onset:60) in her father; Diabetes in her mother; Heart attack in her brothers; Heart attack (age of onset:54) in her mother; Heart disease in her father; Hypertension in her brother; Hypothyroidism in her sister; and Lung cancer in her father and sister. Allergies  Allergen Reactions  . Ciprofloxacin     REACTION: rash IV  . Codeine     REACTION: nausea/vomiting  . Erythromycin       REACTION: tongue burns  . Meperidine Hcl     REACTION: Nausea/vomiting  . Penicillins     REACTION: rash     Review of Systems see hpi     Objective:   Physical Exam  Nursing note and vitals reviewed. Constitutional: She appears well-developed and well-nourished. No distress.  HENT:  Head: Normocephalic and atraumatic.  Right Ear: External ear normal.  Left Ear: External ear normal.  Eyes: Conjunctivae are normal. No scleral icterus.  Neck: Neck supple. No JVD present.  Cardiovascular: Normal rate, regular rhythm and normal heart sounds.   Pulmonary/Chest: Effort normal and breath sounds normal. No respiratory distress. She has no wheezes. She has no rales.  Neurological: She is alert.  Skin: Skin is warm and dry. She is not diaphoretic.  Psychiatric: She has a normal mood and affect.          Assessment & Plan:

## 2011-04-21 ENCOUNTER — Other Ambulatory Visit: Payer: Self-pay

## 2011-04-21 ENCOUNTER — Inpatient Hospital Stay (HOSPITAL_COMMUNITY)
Admission: EM | Admit: 2011-04-21 | Discharge: 2011-04-24 | DRG: 282 | Disposition: A | Discharge status: Home / Self Care | Payer: Medicare Other | Attending: Internal Medicine | Admitting: Internal Medicine

## 2011-04-21 ENCOUNTER — Encounter (HOSPITAL_COMMUNITY): Payer: Self-pay | Admitting: Emergency Medicine

## 2011-04-21 ENCOUNTER — Emergency Department (HOSPITAL_COMMUNITY): Payer: Medicare Other

## 2011-04-21 DIAGNOSIS — Z87891 Personal history of nicotine dependence: Secondary | ICD-10-CM

## 2011-04-21 DIAGNOSIS — E876 Hypokalemia: Secondary | ICD-10-CM | POA: Diagnosis present

## 2011-04-21 DIAGNOSIS — I714 Abdominal aortic aneurysm, without rupture, unspecified: Secondary | ICD-10-CM | POA: Insufficient documentation

## 2011-04-21 DIAGNOSIS — I10 Essential (primary) hypertension: Secondary | ICD-10-CM | POA: Diagnosis present

## 2011-04-21 DIAGNOSIS — K219 Gastro-esophageal reflux disease without esophagitis: Secondary | ICD-10-CM | POA: Diagnosis present

## 2011-04-21 DIAGNOSIS — I214 Non-ST elevation (NSTEMI) myocardial infarction: Principal | ICD-10-CM

## 2011-04-21 DIAGNOSIS — I25810 Atherosclerosis of coronary artery bypass graft(s) without angina pectoris: Secondary | ICD-10-CM | POA: Diagnosis present

## 2011-04-21 DIAGNOSIS — Z79899 Other long term (current) drug therapy: Secondary | ICD-10-CM

## 2011-04-21 DIAGNOSIS — F329 Major depressive disorder, single episode, unspecified: Secondary | ICD-10-CM | POA: Diagnosis present

## 2011-04-21 DIAGNOSIS — J4489 Other specified chronic obstructive pulmonary disease: Secondary | ICD-10-CM | POA: Diagnosis present

## 2011-04-21 DIAGNOSIS — F3289 Other specified depressive episodes: Secondary | ICD-10-CM | POA: Diagnosis present

## 2011-04-21 DIAGNOSIS — I2510 Atherosclerotic heart disease of native coronary artery without angina pectoris: Secondary | ICD-10-CM | POA: Insufficient documentation

## 2011-04-21 DIAGNOSIS — I959 Hypotension, unspecified: Secondary | ICD-10-CM | POA: Diagnosis present

## 2011-04-21 DIAGNOSIS — Z7902 Long term (current) use of antithrombotics/antiplatelets: Secondary | ICD-10-CM

## 2011-04-21 DIAGNOSIS — M810 Age-related osteoporosis without current pathological fracture: Secondary | ICD-10-CM | POA: Diagnosis present

## 2011-04-21 DIAGNOSIS — Z7982 Long term (current) use of aspirin: Secondary | ICD-10-CM

## 2011-04-21 DIAGNOSIS — I6529 Occlusion and stenosis of unspecified carotid artery: Secondary | ICD-10-CM | POA: Insufficient documentation

## 2011-04-21 DIAGNOSIS — I25709 Atherosclerosis of coronary artery bypass graft(s), unspecified, with unspecified angina pectoris: Secondary | ICD-10-CM | POA: Diagnosis present

## 2011-04-21 DIAGNOSIS — I701 Atherosclerosis of renal artery: Secondary | ICD-10-CM | POA: Insufficient documentation

## 2011-04-21 DIAGNOSIS — I739 Peripheral vascular disease, unspecified: Secondary | ICD-10-CM | POA: Insufficient documentation

## 2011-04-21 DIAGNOSIS — E785 Hyperlipidemia, unspecified: Secondary | ICD-10-CM | POA: Insufficient documentation

## 2011-04-21 DIAGNOSIS — J449 Chronic obstructive pulmonary disease, unspecified: Secondary | ICD-10-CM | POA: Diagnosis present

## 2011-04-21 DIAGNOSIS — I249 Acute ischemic heart disease, unspecified: Secondary | ICD-10-CM

## 2011-04-21 LAB — POCT I-STAT TROPONIN I

## 2011-04-21 LAB — PROTIME-INR
INR: 0.88 (ref 0.00–1.49)
Prothrombin Time: 12.1 seconds (ref 11.6–15.2)

## 2011-04-21 LAB — BASIC METABOLIC PANEL
CO2: 28 mEq/L (ref 19–32)
Chloride: 101 mEq/L (ref 96–112)
Glucose, Bld: 101 mg/dL — ABNORMAL HIGH (ref 70–99)
Sodium: 139 mEq/L (ref 135–145)

## 2011-04-21 LAB — CBC
Hemoglobin: 14.1 g/dL (ref 12.0–15.0)
MCV: 83.2 fL (ref 78.0–100.0)
Platelets: 197 10*3/uL (ref 150–400)
RBC: 5.01 MIL/uL (ref 3.87–5.11)
WBC: 7.5 10*3/uL (ref 4.0–10.5)

## 2011-04-21 MED ORDER — ASPIRIN 81 MG PO CHEW
324.0000 mg | CHEWABLE_TABLET | Freq: Once | ORAL | Status: AC
  Administered 2011-04-21: 324 mg via ORAL
  Filled 2011-04-21: qty 4

## 2011-04-21 MED ORDER — HEPARIN SOD (PORCINE) IN D5W 100 UNIT/ML IV SOLN
900.0000 [IU]/h | INTRAVENOUS | Status: DC
  Administered 2011-04-22: 900 [IU]/h via INTRAVENOUS
  Filled 2011-04-21: qty 250

## 2011-04-21 MED ORDER — NITROGLYCERIN 2 % TD OINT
1.0000 [in_us] | TOPICAL_OINTMENT | Freq: Once | TRANSDERMAL | Status: AC
  Administered 2011-04-21: 1 [in_us] via TOPICAL
  Filled 2011-04-21: qty 1

## 2011-04-21 MED ORDER — HEPARIN BOLUS VIA INFUSION
4000.0000 [IU] | Freq: Once | INTRAVENOUS | Status: AC
  Administered 2011-04-21: 4000 [IU] via INTRAVENOUS
  Filled 2011-04-21: qty 4000

## 2011-04-21 MED ORDER — HEPARIN SODIUM (PORCINE) 5000 UNIT/ML IJ SOLN
INTRAMUSCULAR | Status: AC
  Administered 2011-04-21: 4000 [IU]
  Filled 2011-04-21: qty 1

## 2011-04-21 NOTE — ED Provider Notes (Signed)
History     CSN: [id number] Arrival date & time: 04/21/2011  7:11 PM   First MD Initiated Contact with Patient 04/21/11 2149      Chief Complaint  Patient presents with  . Chest Pain    (Consider location/radiation/quality/duration/timing/severity/associated sxs/prior treatment) The history is provided by the patient and the spouse.  70 yo F with h/o CAD (s/p CABG), PVD here with CP.  Located in L chest.  Radiates to L shoulder and L neck.  It is moderate and intermittent but not present when evaluated in ED.  Started during light exertion and resolved with rest.  No assoc dyspnea, nausea, but did have brief light headedness (no syncope).  Had multiple short episodes today.  Has not had this pain since 90s when she had CABG.  No cough, fever, wheezing.  Past Medical History  Diagnosis Date  . COPD (chronic obstructive pulmonary disease)   . Cerebrovascular disease 01/2009    carotid u/s  R 0-39%   L 60-79%  . CAD (coronary artery disease)     s/CABG (reports IMA and 2 SVGs)  Myoview normal 3/10  . Depression   . GERD (gastroesophageal reflux disease)   . Hypertension   . Hyperlipidemia   . Osteoporosis   . PVD (peripheral vascular disease)   . Dizziness   . AAA (abdominal aortic aneurysm) 01/2009    AAA (2.8 x 3.0)  moderate RAS (left); 2.7 x 2.7 cm (07/11/05)    Past Surgical History  Procedure Date  . Coronary artery bypass graft   . Tubal ligation   . Abdominal aortic aneurysm repair   . Femoral artery stent   . Closed reduction nasal fracture 11/2007  . Carotid endarterectomy 01/02/11    left    Family History  Problem Relation Age of Onset  . Heart attack Mother 56  . Diabetes Mother   . Colon cancer Father 20  . Lung cancer Father   . Heart disease Father     angina  . Lung cancer Sister   . Cancer Sister     lung  . Hypertension Brother   . Heart attack Brother   . Hypothyroidism Sister     autoimmune  . Heart attack Brother     CABG    History    Substance Use Topics  . Smoking status: Former Smoker -- 0.5 packs/day for 40 years    Types: Cigarettes    Quit date: 01/19/1993  . Smokeless tobacco: Never Used   Comment: Quit 12 years ago  . Alcohol Use: Yes     rare    OB History    Grav Para Term Preterm Abortions TAB SAB Ect Mult Living                  Review of Systems  Constitutional: Negative for fever and chills.  HENT: Negative for facial swelling.   Eyes: Negative for visual disturbance.  Respiratory: Negative for cough, chest tightness, shortness of breath and wheezing.   Cardiovascular: Positive for chest pain. Negative for leg swelling.  Gastrointestinal: Negative for nausea, vomiting, abdominal pain and diarrhea.  Genitourinary: Negative for difficulty urinating.  Skin: Negative for rash.  Neurological: Negative for weakness and numbness.  Psychiatric/Behavioral: Negative for behavioral problems and confusion.  All other systems reviewed and are negative.    Allergies  Ciprofloxacin; Codeine; Erythromycin; Meperidine hcl; and Penicillins  Home Medications   Current Outpatient Rx  Name Route Sig Dispense Refill  . ALBUTEROL 90  MCG/ACT IN AERS Inhalation Inhale 2 puffs into the lungs every 6 (six) hours as needed for wheezing. 17 g 12  . ASPIRIN 81 MG PO TABS Oral Take 81 mg by mouth daily.      Marland Kitchen CALCIUM CARBONATE 1500 MG PO TABS Oral Take 1 tablet by mouth 2 (two) times daily.      . OMEGA-3 FATTY ACIDS 1000 MG PO CAPS Oral Take 1 g by mouth daily.      Marland Kitchen HYDROCODONE-ACETAMINOPHEN 5-500 MG PO TABS Oral Take 1 tablet by mouth 2 (two) times daily. For low back pain     . IRBESARTAN-HYDROCHLOROTHIAZIDE 150-12.5 MG PO TABS  TAKE 1 TABLET DAILY 90 tablet 1  . ISOSORBIDE MONONITRATE ER 30 MG PO TB24 Oral Take 1 tablet (30 mg total) by mouth daily. 90 tablet 1  . NEXIUM 40 MG PO CPDR  TAKE 1 CAPSULE ONCE DAILY 90 capsule 1  . ROSUVASTATIN CALCIUM 40 MG PO TABS Oral Take 1 tablet (40 mg total) by mouth daily.  90 tablet 2  . SERTRALINE HCL 100 MG PO TABS  TAKE 1 TABLET DAILY 90 tablet 1  . TIOTROPIUM BROMIDE MONOHYDRATE 18 MCG IN CAPS Inhalation Place 18 mcg into inhaler and inhale as needed. For shortness of breath.      BP 109/67  Pulse 53  Temp(Src) 97.2 F (36.2 C) (Oral)  Resp 15  Ht 5\' 2"  (1.575 m)  Wt 149 lb (67.586 kg)  BMI 27.25 kg/m2  SpO2 95%  Physical Exam  Nursing note and vitals reviewed. Constitutional: She is oriented to person, place, and time. She appears well-developed and well-nourished. No distress.  HENT:  Head: Normocephalic.  Nose: Nose normal.  Eyes: EOM are normal.  Neck: Normal range of motion. Neck supple.  Cardiovascular: Normal rate, regular rhythm and intact distal pulses.   No murmur heard.      2+ distal pulses  Sternotomy scar  Pulmonary/Chest: Effort normal and breath sounds normal. No respiratory distress. She has no wheezes. She exhibits no tenderness.  Abdominal: Soft. She exhibits no distension. There is no tenderness.  Musculoskeletal: Normal range of motion. She exhibits no edema and no tenderness.       No calf TTP  Neurological: She is alert and oriented to person, place, and time.       Normal strength  Skin: Skin is warm and dry. No rash noted. She is not diaphoretic.  Psychiatric: She has a normal mood and affect. Her behavior is normal. Thought content normal.    ED Course  Procedures (including critical care time)  Labs Reviewed  BASIC METABOLIC PANEL - Abnormal; Notable for the following:    Glucose, Bld 101 (*)    GFR calc non Af Amer 64 (*)    GFR calc Af Amer 74 (*)    All other components within normal limits  POCT I-STAT TROPONIN I - Abnormal; Notable for the following:    Troponin i, poc 0.30 (*)    All other components within normal limits  TROPONIN I - Abnormal; Notable for the following:    Troponin I 0.81 (*)    All other components within normal limits  CBC  PROTIME-INR  I-STAT TROPONIN I  HEPARIN LEVEL  (UNFRACTIONATED)  CBC   Dg Chest 2 View  04/21/2011  *RADIOLOGY REPORT*  Clinical Data: Chest pain; history of smoking.  CHEST - 2 VIEW  Comparison: Chest radiograph performed 01/03/2011  Findings: The lungs are well-aerated and clear.  There  is no evidence of focal opacification, pleural effusion or pneumothorax. A vague nonspecific nodular density at the left lung base appears grossly stable from prior studies, such as on 10/30/2008, and likely reflects overlying ribs.  The heart is borderline normal in size; the patient is status post median sternotomy.  No acute osseous abnormalities are seen.  IMPRESSION: No acute cardiopulmonary process seen.  Original Report Authenticated By: Tonia Ghent, M.D.     1. Acute coronary syndrome     Date: 04/21/2011  Rate: 65  Rhythm: normal sinus rhythm  QRS Axis: normal  Intervals: normal  ST/T Wave abnormalities: nonspecific T wave changes  Conduction Disutrbances:none  Narrative Interpretation:   Old EKG Reviewed: changes noted mild nonspecific inferior TWI   Date: 04/21/2011  Rate: 53  Rhythm: sinus bradycardia rhythm  QRS Axis: normal  Intervals: normal  ST/T Wave abnormalities: nonspecific T wave changes  Conduction Disutrbances:none  Narrative Interpretation:   Old EKG Reviewed: changes noted mild nonspecific inferior TWI   MDM  Pt with h/o CAD here with clinical pic concerning for ACS.  EKG with nonspecific changes but mild TWI in inferior leads.  Initial Trop 0.3.  Pain free on eval and no return of pain with ntg paste.  Heparin started after eval. Repeat trop 0.8.  Cardiology consulted and pt admitted in stable condition.        Milus Glazier 04/22/11 0124

## 2011-04-21 NOTE — ED Provider Notes (Signed)
70 year old white female has new onset exertional chest discomfort felt as a tightness with some shortness of breath today with the longest episode lasting about 5 minutes with relief with nitroglycerin with nonspecific inferior T-wave changes compared to prior ECG as well as mildly elevated indeterminate troponin level today.  Hurman Horn, MD 04/22/11 [phone]

## 2011-04-21 NOTE — ED Notes (Signed)
Oxygen applied at 2 LNC

## 2011-04-21 NOTE — ED Notes (Signed)
Patient transported to X-ray 

## 2011-04-21 NOTE — ED Notes (Signed)
Pt reports intermittant chest pressure that radiates to jaw to shoulders. Currently pain free.

## 2011-04-21 NOTE — ED Notes (Signed)
Pt reports chest pain is gone since nitropaste.

## 2011-04-21 NOTE — ED Notes (Signed)
MD at bedside. 

## 2011-04-21 NOTE — ED Notes (Signed)
Family at bedside. 

## 2011-04-21 NOTE — ED Notes (Signed)
Patient is resting comfortably. 

## 2011-04-21 NOTE — Progress Notes (Signed)
ANTICOAGULATION CONSULT NOTE - Initial Consult  Pharmacy Consult for heparin Indication: chest pain/ACS  Allergies  Allergen Reactions  . Ciprofloxacin     REACTION: rash IV  . Codeine     REACTION: nausea/vomiting  . Erythromycin     REACTION: tongue burns  . Meperidine Hcl     REACTION: Nausea/vomiting  . Penicillins     REACTION: rash   Patient Measurements: Height: 5\' 2"  (157.5 cm) Weight: 149 lb (67.586 kg) IBW/kg (Calculated) : 50.1  Adjusted Body Weight: 64 kg  Vital Signs: Temp: 97.2 F (36.2 C) (11/26 2252) Temp src: Oral (11/26 2252) BP: 109/67 mmHg (11/26 2330) Pulse Rate: 53  (11/26 2330)  Labs:  [facility] 04/21/11 1927  HGB 14.1  HCT 41.7  PLT 197  APTT --  LABPROT 12.1  INR 0.88  HEPARINUNFRC --  CREATININE 0.88  CKTOTAL --  CKMB --  TROPONINI --   Estimated Creatinine Clearance: 52.1 ml/min (by C-G formula based on Cr of 0.88).  Medical History: Past Medical History  Diagnosis Date  . COPD (chronic obstructive pulmonary disease)   . Cerebrovascular disease 01/2009    carotid u/s  R 0-39%   L 60-79%  . CAD (coronary artery disease)     s/CABG  Myoview normal 3/10  . Depression   . GERD (gastroesophageal reflux disease)   . Hypertension   . Hyperlipidemia   . Osteoporosis   . PVD (peripheral vascular disease)   . Dizziness   . AAA (abdominal aortic aneurysm) 01/2009    AAA (2.8 x 3.0)  moderate RAS (left); 2.7 x 2.7 cm (07/11/05)   Medications:  Pending  Assessment: 70yo female with CAD c/o CP radiating to jaw and shoulders to begin heparin.  Goal of Therapy:  Heparin level 0.3-0.7 units/ml   Plan:  Will give heparin bolus of 4000 units followed by gtt at 900 units/hr and monitor heparin levels and CBC.  Colleen Can PharmD BCPS 04/21/2011,11:41 PM

## 2011-04-21 NOTE — ED Notes (Signed)
PT. REPORTS GENERALIZED CHEST PAIN RADIATING TO SHOULDERS AND JAWS ONSET TODAY , EXERTIONAL DYSPNEA ,DRY COUGH , NO NAUSEA OR VOMITTING , DIAPHORESIS,  .

## 2011-04-22 ENCOUNTER — Encounter (HOSPITAL_COMMUNITY)
Admission: EM | Disposition: A | Discharge status: Home / Self Care | Payer: Self-pay | Source: Home / Self Care | Attending: Internal Medicine

## 2011-04-22 ENCOUNTER — Encounter (HOSPITAL_COMMUNITY): Payer: Self-pay | Admitting: Internal Medicine

## 2011-04-22 DIAGNOSIS — I214 Non-ST elevation (NSTEMI) myocardial infarction: Secondary | ICD-10-CM

## 2011-04-22 DIAGNOSIS — I2510 Atherosclerotic heart disease of native coronary artery without angina pectoris: Secondary | ICD-10-CM

## 2011-04-22 DIAGNOSIS — R079 Chest pain, unspecified: Secondary | ICD-10-CM

## 2011-04-22 HISTORY — PX: LEFT HEART CATHETERIZATION WITH CORONARY/GRAFT ANGIOGRAM: SHX5450

## 2011-04-22 LAB — CBC
HCT: 39.3 % (ref 36.0–46.0)
Hemoglobin: 12.7 g/dL (ref 12.0–15.0)
MCH: 27.2 pg (ref 26.0–34.0)
MCH: 27.1 pg (ref 26.0–34.0)
MCHC: 32.8 g/dL (ref 30.0–36.0)
MCHC: 32.3 g/dL (ref 30.0–36.0)
MCV: 83 fL (ref 78.0–100.0)
MCV: 84 fL (ref 78.0–100.0)
Platelets: 157 10*3/uL (ref 150–400)
RBC: 4.68 MIL/uL (ref 3.87–5.11)
RDW: 15.5 % (ref 11.5–15.5)

## 2011-04-22 LAB — CARDIAC PANEL(CRET KIN+CKTOT+MB+TROPI)
CK, MB: 11.2 ng/mL (ref 0.3–4.0)
Relative Index: 6.3 — ABNORMAL HIGH (ref 0.0–2.5)
Relative Index: 8.1 — ABNORMAL HIGH (ref 0.0–2.5)
Total CK: 134 U/L (ref 7–177)
Total CK: 131 U/L (ref 7–177)
Total CK: 138 U/L (ref 7–177)

## 2011-04-22 LAB — COMPREHENSIVE METABOLIC PANEL
AST: 21 U/L (ref 0–37)
CO2: 27 mEq/L (ref 19–32)
Calcium: 9.6 mg/dL (ref 8.4–10.5)
Creatinine, Ser: 0.83 mg/dL (ref 0.50–1.10)
GFR calc Af Amer: 80 mL/min — ABNORMAL LOW (ref >90)
GFR calc non Af Amer: 69 mL/min — ABNORMAL LOW (ref >90)
Total Protein: 6.9 g/dL (ref 6.0–8.3)

## 2011-04-22 LAB — MRSA PCR SCREENING: MRSA by PCR: NEGATIVE

## 2011-04-22 LAB — CREATININE, SERUM: Creatinine, Ser: 0.78 mg/dL (ref 0.50–1.10)

## 2011-04-22 LAB — HEMOGLOBIN A1C: Mean Plasma Glucose: 126 mg/dL — ABNORMAL HIGH (ref <117)

## 2011-04-22 LAB — PROTIME-INR: INR: 0.97 (ref 0.00–1.49)

## 2011-04-22 SURGERY — LEFT HEART CATHETERIZATION WITH CORONARY/GRAFT ANGIOGRAM
Anesthesia: Moderate Sedation

## 2011-04-22 MED ORDER — SERTRALINE HCL 100 MG PO TABS
100.0000 mg | ORAL_TABLET | Freq: Every day | ORAL | Status: DC
Start: 2011-04-22 — End: 2011-04-24
  Administered 2011-04-22 – 2011-04-24 (×3): 100 mg via ORAL
  Filled 2011-04-22 (×3): qty 1

## 2011-04-22 MED ORDER — SODIUM CHLORIDE 0.9 % IJ SOLN: 3.0000 mL | INTRAMUSCULAR | Status: DC | PRN

## 2011-04-22 MED ORDER — ASPIRIN 325 MG PO TABS: 325.0000 mg | ORAL_TABLET | Freq: Every day | ORAL | Status: DC

## 2011-04-22 MED ORDER — OLMESARTAN MEDOXOMIL 20 MG PO TABS
20.0000 mg | ORAL_TABLET | Freq: Every day | ORAL | Status: DC
  Administered 2011-04-22 – 2011-04-24 (×3): 20 mg via ORAL
  Filled 2011-04-22 (×3): qty 1

## 2011-04-22 MED ORDER — ACETAMINOPHEN 325 MG PO TABS: 650.0000 mg | ORAL_TABLET | ORAL | Status: DC | PRN

## 2011-04-22 MED ORDER — TIOTROPIUM BROMIDE MONOHYDRATE 18 MCG IN CAPS
18.0000 ug | ORAL_CAPSULE | Freq: Every day | RESPIRATORY_TRACT | Status: DC
  Administered 2011-04-23 – 2011-04-24 (×2): 18 ug via RESPIRATORY_TRACT
  Filled 2011-04-22: qty 5

## 2011-04-22 MED ORDER — SODIUM CHLORIDE 0.9 % IV SOLN
250.0000 mL | INTRAVENOUS | Status: DC | PRN
  Administered 2011-04-22: 1000 mL via INTRAVENOUS

## 2011-04-22 MED ORDER — POTASSIUM CHLORIDE CRYS ER 20 MEQ PO TBCR
40.0000 meq | EXTENDED_RELEASE_TABLET | Freq: Once | ORAL | Status: AC
  Administered 2011-04-22: 40 meq via ORAL
  Filled 2011-04-22: qty 2

## 2011-04-22 MED ORDER — HEPARIN (PORCINE) IN NACL 2-0.9 UNIT/ML-% IJ SOLN
INTRAMUSCULAR | Status: AC
  Filled 2011-04-22: qty 2000

## 2011-04-22 MED ORDER — BIOTENE DRY MOUTH MT LIQD
15.0000 mL | Freq: Two times a day (BID) | OROMUCOSAL | Status: DC
  Administered 2011-04-22 – 2011-04-24 (×5): 15 mL via OROMUCOSAL

## 2011-04-22 MED ORDER — IRBESARTAN-HYDROCHLOROTHIAZIDE 150-12.5 MG PO TABS: 1.0000 | ORAL_TABLET | Freq: Every day | ORAL | Status: DC

## 2011-04-22 MED ORDER — HEPARIN SOD (PORCINE) IN D5W 100 UNIT/ML IV SOLN: 1000.0000 [IU]/h | INTRAVENOUS | Status: DC

## 2011-04-22 MED ORDER — CLOPIDOGREL BISULFATE 75 MG PO TABS
75.0000 mg | ORAL_TABLET | Freq: Every day | ORAL | Status: DC
  Administered 2011-04-23 – 2011-04-24 (×2): 75 mg via ORAL
  Filled 2011-04-22 (×3): qty 1

## 2011-04-22 MED ORDER — MIDAZOLAM HCL 2 MG/2ML IJ SOLN
INTRAMUSCULAR | Status: AC
  Filled 2011-04-22: qty 2

## 2011-04-22 MED ORDER — NITROGLYCERIN 0.2 MG/ML ON CALL CATH LAB
INTRAVENOUS | Status: AC
  Filled 2011-04-22: qty 1

## 2011-04-22 MED ORDER — FENTANYL CITRATE 0.05 MG/ML IJ SOLN
INTRAMUSCULAR | Status: AC
  Filled 2011-04-22: qty 2

## 2011-04-22 MED ORDER — ONDANSETRON HCL 4 MG/2ML IJ SOLN: 4.0000 mg | Freq: Four times a day (QID) | INTRAMUSCULAR | Status: DC | PRN

## 2011-04-22 MED ORDER — CALCIUM CARBONATE 1500 (600 CA) MG PO TABS: 1.0000 | ORAL_TABLET | Freq: Two times a day (BID) | ORAL | Status: DC

## 2011-04-22 MED ORDER — OMEGA-3-ACID ETHYL ESTERS 1 G PO CAPS
1.0000 g | ORAL_CAPSULE | Freq: Every day | ORAL | Status: DC
  Administered 2011-04-22 – 2011-04-24 (×3): 1 g via ORAL
  Filled 2011-04-22 (×3): qty 1

## 2011-04-22 MED ORDER — ISOSORBIDE MONONITRATE ER 30 MG PO TB24
30.0000 mg | ORAL_TABLET | Freq: Every day | ORAL | Status: DC
  Administered 2011-04-22 – 2011-04-23 (×2): 30 mg via ORAL
  Filled 2011-04-22 (×2): qty 1

## 2011-04-22 MED ORDER — ROSUVASTATIN CALCIUM 40 MG PO TABS
40.0000 mg | ORAL_TABLET | Freq: Every day | ORAL | Status: DC
  Filled 2011-04-22: qty 1

## 2011-04-22 MED ORDER — SODIUM CHLORIDE 0.9 % IV SOLN
1.0000 mL/kg/h | INTRAVENOUS | Status: DC
  Administered 2011-04-22: 1 mL/kg/h via INTRAVENOUS

## 2011-04-22 MED ORDER — HEPARIN SODIUM (PORCINE) 5000 UNIT/ML IJ SOLN
5000.0000 [IU] | Freq: Three times a day (TID) | INTRAMUSCULAR | Status: DC
  Administered 2011-04-22 – 2011-04-23 (×2): 5000 [IU] via SUBCUTANEOUS
  Filled 2011-04-22 (×5): qty 1

## 2011-04-22 MED ORDER — ASPIRIN 300 MG RE SUPP: 300.0000 mg | RECTAL | Status: AC

## 2011-04-22 MED ORDER — ALBUTEROL SULFATE HFA 108 (90 BASE) MCG/ACT IN AERS: 2.0000 | INHALATION_SPRAY | Freq: Four times a day (QID) | RESPIRATORY_TRACT | Status: DC | PRN

## 2011-04-22 MED ORDER — MORPHINE SULFATE 2 MG/ML IJ SOLN: 2.0000 mg | INTRAMUSCULAR | Status: DC | PRN

## 2011-04-22 MED ORDER — ATROPINE SULFATE 1 MG/ML IJ SOLN
INTRAMUSCULAR | Status: AC
  Filled 2011-04-22: qty 1

## 2011-04-22 MED ORDER — CALCIUM CARBONATE 1250 (500 CA) MG PO TABS
1.0000 | ORAL_TABLET | Freq: Every day | ORAL | Status: DC
  Administered 2011-04-22 – 2011-04-24 (×3): 500 mg via ORAL
  Filled 2011-04-22 (×3): qty 1

## 2011-04-22 MED ORDER — ASPIRIN EC 325 MG PO TBEC
325.0000 mg | DELAYED_RELEASE_TABLET | Freq: Every day | ORAL | Status: DC
  Administered 2011-04-23 – 2011-04-24 (×2): 325 mg via ORAL
  Filled 2011-04-22 (×2): qty 1

## 2011-04-22 MED ORDER — PANTOPRAZOLE SODIUM 40 MG PO TBEC
40.0000 mg | DELAYED_RELEASE_TABLET | Freq: Every day | ORAL | Status: DC
  Administered 2011-04-22 – 2011-04-24 (×3): 40 mg via ORAL
  Filled 2011-04-22 (×3): qty 1

## 2011-04-22 MED ORDER — HYDROCHLOROTHIAZIDE 12.5 MG PO CAPS
12.5000 mg | ORAL_CAPSULE | Freq: Every day | ORAL | Status: DC
  Administered 2011-04-22: 12.5 mg via ORAL
  Filled 2011-04-22 (×2): qty 1

## 2011-04-22 MED ORDER — HYDROCODONE-ACETAMINOPHEN 5-325 MG PO TABS
1.0000 | ORAL_TABLET | ORAL | Status: DC | PRN
  Administered 2011-04-22 – 2011-04-24 (×6): 1 via ORAL
  Filled 2011-04-22 (×6): qty 1

## 2011-04-22 MED ORDER — ROSUVASTATIN CALCIUM 40 MG PO TABS
40.0000 mg | ORAL_TABLET | Freq: Every day | ORAL | Status: DC
  Administered 2011-04-22 – 2011-04-24 (×3): 40 mg via ORAL
  Filled 2011-04-22 (×3): qty 1

## 2011-04-22 MED ORDER — ATROPINE SULFATE 0.1 MG/ML IJ SOLN
0.5000 mg | Freq: Once | INTRAMUSCULAR | Status: AC
Start: 2011-04-22 — End: 2011-04-22
  Administered 2011-04-22: 0.5 mg via INTRAVENOUS

## 2011-04-22 MED ORDER — POTASSIUM CHLORIDE CRYS ER 20 MEQ PO TBCR
20.0000 meq | EXTENDED_RELEASE_TABLET | Freq: Once | ORAL | Status: AC
  Administered 2011-04-22: 20 meq via ORAL
  Filled 2011-04-22: qty 1

## 2011-04-22 MED ORDER — NITROGLYCERIN 0.4 MG SL SUBL: 0.4000 mg | SUBLINGUAL_TABLET | SUBLINGUAL | Status: DC | PRN

## 2011-04-22 MED ORDER — LIDOCAINE HCL (PF) 1 % IJ SOLN
INTRAMUSCULAR | Status: AC
  Filled 2011-04-22: qty 30

## 2011-04-22 MED ORDER — ASPIRIN 81 MG PO CHEW: 324.0000 mg | CHEWABLE_TABLET | Freq: Once | ORAL | Status: DC

## 2011-04-22 MED ORDER — METOPROLOL TARTRATE 25 MG PO TABS
25.0000 mg | ORAL_TABLET | Freq: Two times a day (BID) | ORAL | Status: DC
  Filled 2011-04-22 (×5): qty 1

## 2011-04-22 MED ORDER — CLOPIDOGREL BISULFATE 300 MG PO TABS
600.0000 mg | ORAL_TABLET | Freq: Once | ORAL | Status: AC
  Administered 2011-04-22: 600 mg via ORAL
  Filled 2011-04-22: qty 2

## 2011-04-22 MED ORDER — ONDANSETRON HCL 4 MG/2ML IJ SOLN
4.0000 mg | Freq: Four times a day (QID) | INTRAMUSCULAR | Status: DC | PRN
  Administered 2011-04-22: 4 mg via INTRAVENOUS

## 2011-04-22 MED ORDER — ASPIRIN 81 MG PO CHEW
324.0000 mg | CHEWABLE_TABLET | ORAL | Status: AC
  Administered 2011-04-22: 324 mg via ORAL
  Filled 2011-04-22: qty 4

## 2011-04-22 MED ORDER — SODIUM CHLORIDE 0.9 % IV SOLN
INTRAVENOUS | Status: DC
  Administered 2011-04-22: 20:00:00 via INTRAVENOUS

## 2011-04-22 MED ORDER — SODIUM CHLORIDE 0.9 % IJ SOLN
3.0000 mL | Freq: Two times a day (BID) | INTRAMUSCULAR | Status: DC
  Administered 2011-04-22: 3 mL via INTRAVENOUS

## 2011-04-22 MED ORDER — DIAZEPAM 5 MG PO TABS
5.0000 mg | ORAL_TABLET | ORAL | Status: AC
  Administered 2011-04-22: 5 mg via ORAL
  Filled 2011-04-22: qty 1

## 2011-04-22 MED ORDER — DIAZEPAM 2 MG PO TABS: 2.0000 mg | ORAL_TABLET | ORAL | Status: DC | PRN

## 2011-04-22 NOTE — ED Notes (Signed)
Patient is resting comfortably. 

## 2011-04-22 NOTE — H&P (Signed)
Primary Cardiologist: Bensihmon  HPI: 70 YOWF with PMH of CAD s/p CABG in 1999, PVD s/p bilateral iliac stenting, L CEA who presents for evaluation of chest pain.  She has had negative myoviews in 2011 and 2010.  Today, she was in her usual state of health and developed intermittent chest pressure that radiated to her back, shoulders, and jaw.  This occurred intermittently from noon to 7pm.  It was associated with mild DOE.  The pain was a 6/10.  She came to the ED and was given ASA, NTGpaste and was stated on heparin.  Her CP resolved and cardiology was consulted.     Past Medical History  Diagnosis Date  . COPD (chronic obstructive pulmonary disease)   . Cerebrovascular disease 01/2009    carotid u/s  R 0-39%   L 60-79%  . CAD (coronary artery disease)     s/CABG (reports IMA and 2 SVGs)  Myoview normal 3/10  . Depression   . GERD (gastroesophageal reflux disease)   . Hypertension   . Hyperlipidemia   . Osteoporosis   . PVD (peripheral vascular disease)   . Dizziness   . AAA (abdominal aortic aneurysm) 01/2009    AAA (2.8 x 3.0)  moderate RAS (left); 2.7 x 2.7 cm (07/11/05)    History   Social History  . Marital Status: Married    Spouse Name: N/A    Number of Children: 5  . Years of Education: N/A   Occupational History  . Retired     former  Engineer, [employer] (consulting)   Social History Main Topics  . Smoking status: Former Smoker -- 0.5 packs/day for 40 years    Types: Cigarettes    Quit date: 01/19/1993  . Smokeless tobacco: Never Used   Comment: Quit 12 years ago  . Alcohol Use: Yes     rare  . Drug Use: No  . Sexually Active: Not on file   Other Topics Concern  . Not on file   Social History Narrative   Married with 5 children    Family History  Problem Relation Age of Onset  . Heart attack Mother 59  . Diabetes Mother   . Colon cancer Father 48  . Lung cancer Father   . Heart disease Father     angina  . Lung cancer Sister   . Cancer Sister     lung  . Hypertension  Brother   . Heart attack Brother   . Hypothyroidism Sister     autoimmune  . Heart attack Brother     CABG    Medications Prior to Admission  Medication Dose Route Frequency Provider Last Rate Last Dose  . aspirin chewable tablet 324 mg  324 mg Oral Once [employer]   324 mg at 04/21/11 2238  . heparin 100 units/mL bolus via infusion 4,000 Units  4,000 Units Intravenous Once Colleen Can, PHARMD   4,000 Units at 04/21/11 2348  . heparin 5000 UNIT/ML injection        4,000 Units at 04/21/11 2346  . heparin ADULT infusion 100 units/ml (25000 units/2  900 Units/hr Intravenous Continuous Veronda Hollie Salk, PHARMD      . nitroGLYCERIN (NITROGLYN) 2 % ointment 1 inch  1 inch Topical Once Milus Glazier   1 inch at 04/21/11 2238   Medications Prior to Admission  Medication Sig Dispense Refill  . albuterol (PROVENTIL,VENTOLIN) 90 MCG/ACT inhaler Inhale 2 puffs into the lungs every 6 (six) hours as needed for wheezing.  17 g  12  . aspirin 81 MG tablet Take 81 mg by mouth daily.        . Calcium Carbonate 1500 MG TABS Take 1 tablet by mouth 2 (two) times daily.        . fish oil-omega-3 fatty acids 1000 MG capsule Take 1 g by mouth daily.        Marland Kitchen HYDROcodone-acetaminophen (VICODIN) 5-500 MG per tablet Take 1 tablet by mouth 2 (two) times daily. For low back pain       . irbesartan-hydrochlorothiazide (AVALIDE) 150-12.5 MG per tablet TAKE 1 TABLET DAILY  90 tablet  1  . isosorbide mononitrate (IMDUR) 30 MG 24 hr tablet Take 1 tablet (30 mg total) by mouth daily.  90 tablet  1  . NEXIUM 40 MG capsule TAKE 1 CAPSULE ONCE DAILY  90 capsule  1  . rosuvastatin (CRESTOR) 40 MG tablet Take 1 tablet (40 mg total) by mouth daily.  90 tablet  2  . sertraline (ZOLOFT) 100 MG tablet TAKE 1 TABLET DAILY  90 tablet  1  . tiotropium (SPIRIVA) 18 MCG inhalation capsule Place 18 mcg into inhaler and inhale as needed. For shortness of breath.        Allergies  Allergen Reactions  .  Ciprofloxacin     REACTION: rash IV  . Codeine     REACTION: nausea/vomiting  . Erythromycin     REACTION: tongue burns  . Meperidine Hcl     REACTION: Nausea/vomiting  . Penicillins     REACTION: rash    Review of Systems: All systems reviewed and are negative except as mentioned above in the history of present illness.    PHYSICAL EXAM: Filed Vitals:   04/21/11 2330  BP: 109/67  Pulse: 53  Temp:   Resp: 15  O2 sat 95%.  GENERAL: No acute distress.   HEENT: Normocephalic, atraumatic.  Oropharynx is pink and moist without lesions.  NECK: Supple, no LAD, no JVD, no masses. CV: Regular rate and rhythm with no murmurs, rubs, or gallops.   LUNGS: Clear to auscultation bilaterally.   ABDOMEN: +BS, soft, nontender, nondistended.  EXTREMITIES: No clubbing, cyanosis, or edema.  2+ fem B without bruits NEURO: AO x 3, no focal deficits. PYSCH: Normal affect. SKIN: No rashes.    ECG: SR with STD/TWI inferolaterally that are slightly more pronounced than priors.   Results for orders placed during the hospital encounter of 04/21/11 (from the past 24 hour(s))  CBC     Status: Normal   Collection Time   04/21/11  7:27 PM      Component Value Range   WBC 7.5  4.0 - 10.5 (K/uL)   RBC 5.01  3.87 - 5.11 (MIL/uL)   Hemoglobin 14.1  12.0 - 15.0 (g/dL)   HCT 96.0  45.4 - 09.8 (%)   MCV 83.2  78.0 - 100.0 (fL)   MCH 28.1  26.0 - 34.0 (pg)   MCHC 33.8  30.0 - 36.0 (g/dL)   RDW 11.9  14.7 - 82.9 (%)   Platelets 197  150 - 400 (K/uL)  BASIC METABOLIC PANEL     Status: Abnormal   Collection Time   04/21/11  7:27 PM      Component Value Range   Sodium 139  135 - 145 (mEq/L)   Potassium 3.5  3.5 - 5.1 (mEq/L)   Chloride 101  96 - 112 (mEq/L)   CO2 28  19 - 32 (mEq/L)   Glucose, Bld  101 (*) 70 - 99 (mg/dL)   BUN 17  6 - 23 (mg/dL)   Creatinine, Ser 4.09  0.50 - 1.10 (mg/dL)   Calcium 9.7  8.4 - 81.1 (mg/dL)   GFR calc non Af Amer 64 (*) >90 (mL/min)   GFR calc Af Amer 74 (*) >90  (mL/min)  PROTIME-INR     Status: Normal   Collection Time   04/21/11  7:27 PM      Component Value Range   Prothrombin Time 12.1  11.6 - 15.2 (seconds)   INR 0.88  0.00 - 1.49   POCT I-STAT TROPONIN I     Status: Abnormal   Collection Time   04/21/11  8:09 PM      Component Value Range   Troponin i, poc 0.30 (*) 0.00 - 0.08 (ng/mL)   Comment NOTIFIED PHYSICIAN     Comment 3           TROPONIN I     Status: Abnormal   Collection Time   04/21/11 10:24 PM      Component Value Range   Troponin I 0.81 (*) <0.30 (ng/mL)   Dg Chest 2 View  04/21/2011  *RADIOLOGY REPORT*  Clinical Data: Chest pain; history of smoking.  CHEST - 2 VIEW  Comparison: Chest radiograph performed 01/03/2011  Findings: The lungs are well-aerated and clear.  There is no evidence of focal opacification, pleural effusion or pneumothorax. A vague nonspecific nodular density at the left lung base appears grossly stable from prior studies, such as on 10/30/2008, and likely reflects overlying ribs.  The heart is borderline normal in size; the patient is status post median sternotomy.  No acute osseous abnormalities are seen.  IMPRESSION: No acute cardiopulmonary process seen.  Original Report Authenticated By: Tonia Ghent, M.D.     ASSESSMENT and PLAN: The patient is a 70 year old white female with a past medical history is significant for coronary artery disease status post coronary artery bypass grafting bilateral iliac stenting and left carotid endarterectomy, who presents for evaluation of chest pain and was found to have EKG changes in the inferolateral leads and mildly elevated troponins at 0.81, consistent with ACS/NSTEMI  #1 We will admit the patient to our cardiology under Dr. Kathlen Brunswick care.  #2 ACS/NSTEMI - currently the patient is chest pain-free. Given her presentation and the fact that her bypass grafts are greater than 66 years old, I do feel she would benefit from an invasive strategy.  We will start the  patient on aspirin 325 mg daily, we will load her with Plavix 600 mg and and give her 75 mg daily. We will systemically anticoagulate the patient with heparin. We will continue her on a nitroglycerin patch.  We will continue her on her home antihypertensive regimen. We will start her on a low-dose beta blocker. We'll cycle cardiac enzymes.  As mentioned above, I do feel she would benefit from an invasive strategy with a coronary artery and angiography and bypass graft angiography and possible PCI. Should the patient developed chest pain, she will let us know this. We will monitor on telemetry.  #3 Hypertension-we will continue her home dose of Avalide and start on low-dose beta blocker.  #4 Hyperlipidemia-check fasting lipid profile and continue Crestor.  #5. COPD-continue Symbicort and as needed albuterol.  #6 FEN-saline lock IV fluids. Replete potassium as she is hypokalemic. NPO.  #7 DVT prophylaxis-not indicated as the patient is on heparin

## 2011-04-22 NOTE — Interval H&P Note (Signed)
History and Physical Interval Note:   04/22/2011   4:30 PM   Andrea [name]  has presented today for surgery, with the diagnosis of nstemi  The various methods of treatment have been discussed with the patient and family. After consideration of risks, benefits and other options for treatment, the patient has consented to  Procedure(s): LEFT HEART CATHETERIZATION WITH CORONARY/GRAFT ANGIOGRAM as a surgical intervention .  The patients' history has been reviewed, patient examined, no change in status, stable for surgery.  I have reviewed the patients' chart and labs.  Questions were answered to the patient's satisfaction.     Arvilla Meres  MD

## 2011-04-22 NOTE — ED Notes (Signed)
MD at bedside. 

## 2011-04-22 NOTE — ED Provider Notes (Signed)
I saw and evaluated the patient, reviewed the resident's note and Pt's ECG and I agree with the findings and plan.  Hurman Horn, MD 04/22/11 [phone]

## 2011-04-22 NOTE — Progress Notes (Signed)
CRITICAL VALUE ALERT  Critical value received: ck-mb 8.0 troponin 1.14  Date of notification: 04/22/2011   Time of notification:  03:06 am  Critical value read back:yes  Nurse who received alert:  Natividad Brood   MD notified (1st page):  Dr. Mayford Knife  Time of first page:  03:06 am  MD notified (2nd page):  Time of second page:  Responding MD:  Dr. Mayford Knife  Time MD responded: 03:06 am

## 2011-04-22 NOTE — Procedures (Signed)
Cardiac Cath Procedure Note:  Indication: NSTEMI  Procedures performed:  1) Selective coronary angiography 2) SVG angiography 3) LIMA angiography 4) Left heart catheterization 4) Left ventriculogram  Description of procedure:   The risks and indication of the procedure were explained. Consent was signed and placed on the chart. An appropriate timeout was taken prior to the procedure. The right groin was prepped and draped in the routine sterile fashion and anesthetized with 1% local lidocaine.   A 5 FR arterial sheath was placed in the right femoral artery using a modified Seldinger technique. Standard catheters including a JL4, JR4 and angled pigtail were used. All catheter exchanges were made over a wire.  Complications:  None apparent  Findings:  Ao Pressure: 124/69 (92) LV Pressure:  150/7/17 There was no signficant gradient across the aortic valve on pullback.  Left main: Mild plaque.  LAD:  Diffuse 40-50% prox to mid with focal 60-70% in midsection  LCX: Gave off two large OMs 30% mid. OM-1 40-50% ostial. OM-2 30%. Large atrial branch which fed L -> R collateral to R PL  RCA: Totally occluded ostially  LIMA to LAD: Atretic due to good native flow in LAD  SVG sequential graft to PDA and PL: 30% prox. 40-50% in body. Subtotal occlusion of distal limb to PL (infarct vessel). Native PL small and heavily diseased  LV-gram done in the RAO projection: Ejection fraction = 55% with inferobasilar AK.  Assessment:  1) Native vessel CAD as described above 2) Atretic LIMA to LAD 3) Loss of distal limb of SVG -> PDA -> PL with L-R collaterals (culprit) 4) Ejection fraction = 55% with inferobasilar AK.  Plan/Discussion:  Medical therapy. Distal limb of SVG to R system is very small and not suitable for PCI. Lesion in LAD is borderline but doubt ischemic. If CP persists as outpatient can consider Myoview to evaluate.   Daniel Bensimhon 5:12 PM

## 2011-04-22 NOTE — Progress Notes (Signed)
Pt c/o nausea, blood pressure in the 80's and heart rate in the 30's. Notified MD of this. Administered 4mg  zofran and 0.5mg  atropine IV per MD order. After administration patient's condition improved. BP in the 90's and heart rate staying the 50's-60's.  Natividad Brood  04/22/2011 11:14 PM.

## 2011-04-22 NOTE — H&P (View-Only) (Signed)
[name], female   DOB: 08-07-1940, 70 y.o.   MRN: [id number] TELEMETRY: Reviewed telemetry pt in NSR: Filed Vitals:   04/22/11 0400 04/22/11 0500 04/22/11 0600 04/22/11 0700  BP: 115/59 109/62 129/69 103/41  Pulse: 52 58 57 52  Temp: 98.1 F (36.7 C)     TempSrc: Oral     Resp: 17 16 16 17   Height:      Weight:  66.5 kg (146 lb 9.7 oz)    SpO2: 96% 95% 96% 98%    Intake/Output Summary (Last 24 hours) at 04/22/11 0806 Last data filed at 04/22/11 0700  Gross per 24 hour  Intake    126 ml  Output    400 ml  Net   -274 ml    SUBJECTIVE Still complains of some intermittent chest pressure. No dyspnea. Reports that she has had a couple of cardiac caths since her CABG but these may have been done in [place]. Original CABG done in [place].   LABS: Basic Metabolic Panel:  Basename 04/22/11 0300 04/21/11 1927  NA 138 139  K 3.4* 3.5  CL 101 101  CO2 27 28  GLUCOSE 103* 101*  BUN 17 17  CREATININE 0.83 0.88  CALCIUM 9.6 9.7  MG -- --  PHOS -- --   Liver Function Tests:  Basename 04/22/11 0300  AST 21  ALT 11  ALKPHOS 79  BILITOT 0.3  PROT 6.9  ALBUMIN 3.6   No results found for this basename: LIPASE:2,AMYLASE:2 in the last 72 hours CBC:  Basename 04/21/11 1927  WBC 7.5  NEUTROABS --  HGB 14.1  HCT 41.7  MCV 83.2  PLT 197   Cardiac Enzymes:  Basename 04/22/11 0306 04/21/11 2224  CKTOTAL 134 --  CKMB 8.0* --  CKMBINDEX -- --  TROPONINI 1.14* 0.81*   BNP: No results found for this basename: POCBNP:3 in the last 72 hours D-Dimer: No results found for this basename: DDIMER:2 in the last 72 hours Hemoglobin A1C: No results found for this basename: HGBA1C in the last 72 hours Fasting Lipid Panel:  Basename 04/22/11 0300  CHOL 327*  HDL 34*  LDLCALC 252*  TRIG 206*  CHOLHDL 9.6  LDLDIRECT --   Thyroid Function Tests: No results found for this basename: TSH,T4TOTAL,FREET3,T3FREE,THYROIDAB in the last 72 hours Anemia Panel: No  results found for this basename: VITAMINB12,FOLATE,FERRITIN,TIBC,IRON,RETICCTPCT in the last 72 hours  Radiology/Studies:  Dg Chest 2 View  04/21/2011  *RADIOLOGY REPORT*  Clinical Data: Chest pain; history of smoking.  CHEST - 2 VIEW  Comparison: Chest radiograph performed 01/03/2011  Findings: The lungs are well-aerated and clear.  There is no evidence of focal opacification, pleural effusion or pneumothorax. A vague nonspecific nodular density at the left lung base appears grossly stable from prior studies, such as on 10/30/2008, and likely reflects overlying ribs.  The heart is borderline normal in size; the patient is status post median sternotomy.  No acute osseous abnormalities are seen.  IMPRESSION: No acute cardiopulmonary process seen.  Original Report Authenticated By: Tonia Ghent, M.D.   Ecg shows nonspecific T wave abnormality. Improved since admission.  PHYSICAL EXAM General: Well developed, well nourished, in no acute distress. Head: Normocephalic, atraumatic, sclera non-icteric, no xanthomas, nares are without discharge. Neck: Negative for carotid bruits. JVD not elevated. Lungs: Clear bilaterally to auscultation without wheezes, rales, or rhonchi. Breathing is unlabored. Heart: RRR S1 S2 without murmurs, rubs, or gallops.  Abdomen: Soft, non-tender, non-distended with normoactive bowel sounds. No hepatomegaly.  No rebound/guarding. No obvious abdominal masses. Msk:  Strength and tone appears normal for age. Extremities: No clubbing, cyanosis or edema.  2+ femoral pulses without bruit. Good pedal pulses on right. Neuro: Alert and oriented X 3. Moves all extremities spontaneously. Psych:  Responds to questions appropriately with a normal affect.  ASSESSMENT AND PLAN:  1. NSTEMI. Still having some intermittent chest pain. On Heparin IV. Has been loaded already with ASA and Plavix. On metoprolol. Recommend cardiac cath today. 2. PAD s/p bilateral iliac stents. 3. Severe  hyperlipidemia. Patient states she hasn't taken her Crestor for several weeks because of a mix up with her mail in pharmacy. Now on Crestor 40 mg. 4.S/p CABG in 1999 in [place]. 5. Carotid arterial disease. 6. Hypokalemia will replete.  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

## 2011-04-22 NOTE — Progress Notes (Signed)
[name], female   DOB: 03/24/1941, 70 y.o.   MRN: [id number] TELEMETRY: Reviewed telemetry pt in NSR: Filed Vitals:   04/22/11 0400 04/22/11 0500 04/22/11 0600 04/22/11 0700  BP: 115/59 109/62 129/69 103/41  Pulse: 52 58 57 52  Temp: 98.1 F (36.7 C)     TempSrc: Oral     Resp: 17 16 16 17  Height:      Weight:  66.5 kg (146 lb 9.7 oz)    SpO2: 96% 95% 96% 98%    Intake/Output Summary (Last 24 hours) at 04/22/11 0806 Last data filed at 04/22/11 0700  Gross per 24 hour  Intake    126 ml  Output    400 ml  Net   -274 ml    SUBJECTIVE Still complains of some intermittent chest pressure. No dyspnea. Reports that she has had a couple of cardiac caths since her CABG but these may have been done in [place]. Original CABG done in [place].   LABS: Basic Metabolic Panel:  Basename 04/22/11 0300 04/21/11 1927  NA 138 139  K 3.4* 3.5  CL 101 101  CO2 27 28  GLUCOSE 103* 101*  BUN 17 17  CREATININE 0.83 0.88  CALCIUM 9.6 9.7  MG -- --  PHOS -- --   Liver Function Tests:  Basename 04/22/11 0300  AST 21  ALT 11  ALKPHOS 79  BILITOT 0.3  PROT 6.9  ALBUMIN 3.6   No results found for this basename: LIPASE:2,AMYLASE:2 in the last 72 hours CBC:  Basename 04/21/11 1927  WBC 7.5  NEUTROABS --  HGB 14.1  HCT 41.7  MCV 83.2  PLT 197   Cardiac Enzymes:  Basename 04/22/11 0306 04/21/11 2224  CKTOTAL 134 --  CKMB 8.0* --  CKMBINDEX -- --  TROPONINI 1.14* 0.81*   BNP: No results found for this basename: POCBNP:3 in the last 72 hours D-Dimer: No results found for this basename: DDIMER:2 in the last 72 hours Hemoglobin A1C: No results found for this basename: HGBA1C in the last 72 hours Fasting Lipid Panel:  Basename 04/22/11 0300  CHOL 327*  HDL 34*  LDLCALC 252*  TRIG 206*  CHOLHDL 9.6  LDLDIRECT --   Thyroid Function Tests: No results found for this basename: TSH,T4TOTAL,FREET3,T3FREE,THYROIDAB in the last 72 hours Anemia Panel: No  results found for this basename: VITAMINB12,FOLATE,FERRITIN,TIBC,IRON,RETICCTPCT in the last 72 hours  Radiology/Studies:  Dg Chest 2 View  04/21/2011  *RADIOLOGY REPORT*  Clinical Data: Chest pain; history of smoking.  CHEST - 2 VIEW  Comparison: Chest radiograph performed 01/03/2011  Findings: The lungs are well-aerated and clear.  There is no evidence of focal opacification, pleural effusion or pneumothorax. A vague nonspecific nodular density at the left lung base appears grossly stable from prior studies, such as on 10/30/2008, and likely reflects overlying ribs.  The heart is borderline normal in size; the patient is status post median sternotomy.  No acute osseous abnormalities are seen.  IMPRESSION: No acute cardiopulmonary process seen.  Original Report Authenticated By: JEFFREY CHANG, M.D.   Ecg shows nonspecific T wave abnormality. Improved since admission.  PHYSICAL EXAM General: Well developed, well nourished, in no acute distress. Head: Normocephalic, atraumatic, sclera non-icteric, no xanthomas, nares are without discharge. Neck: Negative for carotid bruits. JVD not elevated. Lungs: Clear bilaterally to auscultation without wheezes, rales, or rhonchi. Breathing is unlabored. Heart: RRR S1 S2 without murmurs, rubs, or gallops.  Abdomen: Soft, non-tender, non-distended with normoactive bowel sounds. No hepatomegaly.   No rebound/guarding. No obvious abdominal masses. Msk:  Strength and tone appears normal for age. Extremities: No clubbing, cyanosis or edema.  2+ femoral pulses without bruit. Good pedal pulses on right. Neuro: Alert and oriented X 3. Moves all extremities spontaneously. Psych:  Responds to questions appropriately with a normal affect.  ASSESSMENT AND PLAN:  1. NSTEMI. Still having some intermittent chest pain. On Heparin IV. Has been loaded already with ASA and Plavix. On metoprolol. Recommend cardiac cath today. 2. PAD s/p bilateral iliac stents. 3. Severe  hyperlipidemia. Patient states she hasn't taken her Crestor for several weeks because of a mix up with her mail in pharmacy. Now on Crestor 40 mg. 4.S/p CABG in 1999 in [place]. 5. Carotid arterial disease. 6. Hypokalemia will replete.  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   

## 2011-04-22 NOTE — Progress Notes (Signed)
ANTICOAGULATION CONSULT NOTE - Follow Up Consult  Pharmacy Consult for heparin Indication: NSTEMI  Patient Measurements: Height: 5\' 2"  (157.5 cm) Weight: 146 lb 9.7 oz (66.5 kg) IBW/kg (Calculated) : 50.1  Adjusted Body Weight: 63.8kg  Vital Signs: Temp: 98.3 F (36.8 C) (11/27 0902) Temp src: Oral (11/27 0400) BP: 107/49 mmHg (11/27 0902) Pulse Rate: 56  (11/27 0902)  Labs:  Basename 04/22/11 0911 04/22/11 0306 04/22/11 0300 04/21/11 2224 04/21/11 1927  HGB 13.4 -- -- -- 14.1  HCT 40.9 -- -- -- 41.7  PLT 157 -- -- -- 197  APTT -- -- -- -- --  LABPROT 13.1 -- -- -- 12.1  INR 0.97 -- -- -- 0.88  HEPARINUNFRC 0.28* -- -- -- --  CREATININE -- -- 0.83 -- 0.88  CKTOTAL 131 134 -- -- --  CKMB 8.3* 8.0* -- -- --  TROPONINI 0.59* 1.14* -- 0.81* --   Estimated Creatinine Clearance: 54.8 ml/min (by C-G formula based on Cr of 0.83).   Medications:  Scheduled:    . antiseptic oral rinse  15 mL Mouth Rinse BID  . aspirin  324 mg Oral Once  . aspirin  324 mg Oral NOW   Or  . aspirin  300 mg Rectal NOW  . aspirin EC  325 mg Oral Daily  . calcium carbonate  1 tablet Oral Daily  . clopidogrel  600 mg Oral Once  . clopidogrel  75 mg Oral Q breakfast  . diazepam  5 mg Oral On Call  . heparin  4,000 Units Intravenous Once  . heparin      . hydrochlorothiazide  12.5 mg Oral Daily   And  . olmesartan  20 mg Oral Daily  . metoprolol tartrate  25 mg Oral BID  . nitroGLYCERIN  1 inch Topical Once  . omega-3 acid ethyl esters  1 g Oral Daily  . pantoprazole  40 mg Oral Q1200  . potassium chloride  20 mEq Oral Once  . potassium chloride  40 mEq Oral Once  . rosuvastatin  40 mg Oral Daily  . rosuvastatin  40 mg Oral Daily  . sertraline  100 mg Oral Daily  . tiotropium  18 mcg Inhalation Daily  . DISCONTD: aspirin  325 mg Oral Daily  . DISCONTD: Calcium Carbonate  1 tablet Oral BID  . DISCONTD: irbesartan-hydrochlorothiazide  1 tablet Oral Daily  . DISCONTD: sodium chloride  3  mL Intravenous Q12H    Assessment: 70 yo female with CP on heparin at 900 units/hr and slightly below goal.  Patient noted for cath today.  Goal of Therapy:  Heparin level= 0.3-0.7   Plan:  -Will increase heparin to 1000 units/hr and follow post cath.   Benny Lennert 04/22/2011,10:56 AM

## 2011-04-23 LAB — CBC
HCT: 39.3 % (ref 36.0–46.0)
Platelets: 150 10*3/uL (ref 150–400)
RBC: 4.63 MIL/uL (ref 3.87–5.11)
RDW: 15.9 % — ABNORMAL HIGH (ref 11.5–15.5)
WBC: 7.2 10*3/uL (ref 4.0–10.5)

## 2011-04-23 MED ORDER — METOPROLOL TARTRATE 12.5 MG HALF TABLET
12.5000 mg | ORAL_TABLET | Freq: Two times a day (BID) | ORAL | Status: DC
  Administered 2011-04-23 – 2011-04-24 (×3): 12.5 mg via ORAL
  Filled 2011-04-23 (×4): qty 1

## 2011-04-23 NOTE — Progress Notes (Signed)
CARDIAC REHAB PHASE I   PRE:  Rate/Rhythm: 62SR  BP:  Supine: 81/49  Sitting: 95/55  Standing:    SaO2: 93%RA  MODE:  Ambulation: 350 ft   POST:  Rate/Rhythem: 89SR  BP:  Supine:   Sitting: 113/46  Standing:    SaO2: 92%RA   Pt had 4/10 shoulder and jaw discomfort bilaterally when 3/4 into walk. Stated similar to discomfort she came in with. Pain resolved with pt sitting and resting. By the time I had taken BP it had subsided. To chair. Began MI ed. Notified pt's RN.  No dizziness with walk. [id number]  Duanne Limerick

## 2011-04-23 NOTE — Progress Notes (Signed)
[name], female   DOB: 05/24/1941, 70 y.o.   MRN: [id number] [name], female   DOB: Apr 23, 1941, 70 y.o.   MRN: [id number] TELEMETRY: Reviewed telemetry pt in NSR: Filed Vitals:   04/23/11 0400 04/23/11 0500 04/23/11 0600 04/23/11 0700  BP: 98/56 96/44 109/49 92/48  Pulse: 58 60 60 60  Temp: 99.3 F (37.4 C)     TempSrc: Oral     Resp: 19 21 20 16   Height:      Weight: 67.9 kg (149 lb 11.1 oz) 67.9 kg (149 lb 11.1 oz)    SpO2: 94%  90% 92%    Intake/Output Summary (Last 24 hours) at 04/23/11 0727 Last data filed at 04/23/11 0500  Gross per 24 hour  Intake 968.12 ml  Output   1300 ml  Net -331.88 ml    SUBJECTIVE Has minimal twinges of chest pain. Had an episode of nausea last night with vagal response with hypotension and bradycardia. Now resolved. Reports history of marked fatigue on metoprolol.  LABS: Basic Metabolic Panel:  Basename 04/22/11 2015 04/22/11 0300 04/21/11 1927  NA -- 138 139  K -- 3.4* 3.5  CL -- 101 101  CO2 -- 27 28  GLUCOSE -- 103* 101*  BUN -- 17 17  CREATININE 0.78 0.83 --  CALCIUM -- 9.6 9.7  MG -- -- --  PHOS -- -- --   Liver Function Tests:  Basename 04/22/11 0300  AST 21  ALT 11  ALKPHOS 79  BILITOT 0.3  PROT 6.9  ALBUMIN 3.6   No results found for this basename: LIPASE:2,AMYLASE:2 in the last 72 hours CBC:  Basename 04/23/11 0612 04/22/11 2015  WBC 7.2 6.0  NEUTROABS -- --  HGB 12.8 12.7  HCT 39.3 39.3  MCV 84.9 84.0  PLT 150 137*   Cardiac Enzymes:  Basename 04/22/11 2017 04/22/11 0911 04/22/11 0306  CKTOTAL 138 131 134  CKMB 11.2* 8.3* 8.0*  CKMBINDEX -- -- --  TROPONINI 1.08* 0.59* 1.14*   BNP: No results found for this basename: POCBNP:3 in the last 72 hours D-Dimer: No results found for this basename: DDIMER:2 in the last 72 hours Hemoglobin A1C:  Basename 04/22/11 0300  HGBA1C 6.0*   Fasting Lipid Panel:  Basename 04/22/11 0300  CHOL 327*  HDL 34*  LDLCALC 252*  TRIG  206*  CHOLHDL 9.6  LDLDIRECT --   Thyroid Function Tests: No results found for this basename: TSH,T4TOTAL,FREET3,T3FREE,THYROIDAB in the last 72 hours Anemia Panel: No results found for this basename: VITAMINB12,FOLATE,FERRITIN,TIBC,IRON,RETICCTPCT in the last 72 hours  Radiology/Studies:  Dg Chest 2 View  04/21/2011  *RADIOLOGY REPORT*  Clinical Data: Chest pain; history of smoking.  CHEST - 2 VIEW  Comparison: Chest radiograph performed 01/03/2011  Findings: The lungs are well-aerated and clear.  There is no evidence of focal opacification, pleural effusion or pneumothorax. A vague nonspecific nodular density at the left lung base appears grossly stable from prior studies, such as on 10/30/2008, and likely reflects overlying ribs.  The heart is borderline normal in size; the patient is status post median sternotomy.  No acute osseous abnormalities are seen.  IMPRESSION: No acute cardiopulmonary process seen.  Original Report Authenticated By: Tonia Ghent, M.D.   Ecg shows nonspecific T wave abnormality. Improved since admission.  PHYSICAL EXAM General: Well developed, well nourished, in no acute distress. Head: Normocephalic, atraumatic, sclera non-icteric, no xanthomas, nares are without discharge. Neck: Negative for carotid bruits. JVD not elevated. Lungs: Clear bilaterally to  auscultation without wheezes, rales, or rhonchi. Breathing is unlabored. Heart: RRR S1 S2 without murmurs, rubs, or gallops.  Abdomen: Soft, non-tender, non-distended with normoactive bowel sounds. No hepatomegaly. No rebound/guarding. No obvious abdominal masses. Msk:  Strength and tone appears normal for age. Extremities: No clubbing, cyanosis or edema.  2+ femoral pulses without bruit. Good pedal pulses on right. No right groin hematoma. Neuro: Alert and oriented X 3. Moves all extremities spontaneously. Psych:  Responds to questions appropriately with a normal affect.  ASSESSMENT AND PLAN:  1. NSTEMI. By  cath culprit is PLOM branch which is small and diffusely diseased. Not a candidate for PCI. Will treat medically. On ASA, Plavix, nitrates. Will reduce metoprolol to 12.5 mg bid. 2. PAD s/p bilateral iliac stents. 3. Severe hyperlipidemia. Patient states she hasn't taken her Crestor for several weeks because of a mix up with her mail in pharmacy. Now on Crestor 40 mg. 4.S/p CABG in 1999 in [place]. [place] to LAD atretic with good flow down native vessel. SVG to PDA patent. 5. Carotid arterial disease. 6. HTN will hold HCTZ given low BP.  Will advance activity today with cardiac rehab. Transfer to telemetry.  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

## 2011-04-24 DIAGNOSIS — I214 Non-ST elevation (NSTEMI) myocardial infarction: Secondary | ICD-10-CM

## 2011-04-24 LAB — CBC
Hemoglobin: 13.6 g/dL (ref 12.0–15.0)
MCV: 85 fL (ref 78.0–100.0)
Platelets: 141 10*3/uL — ABNORMAL LOW (ref 150–400)
RBC: 5.01 MIL/uL (ref 3.87–5.11)
WBC: 6.7 10*3/uL (ref 4.0–10.5)

## 2011-04-24 LAB — BASIC METABOLIC PANEL
CO2: 28 mEq/L (ref 19–32)
Chloride: 102 mEq/L (ref 96–112)
Creatinine, Ser: 0.88 mg/dL (ref 0.50–1.10)
Glucose, Bld: 92 mg/dL (ref 70–99)

## 2011-04-24 MED ORDER — METOPROLOL TARTRATE 25 MG PO TABS: 12.5000 mg | ORAL_TABLET | Freq: Two times a day (BID) | ORAL | Status: DC

## 2011-04-24 MED ORDER — NITROGLYCERIN 0.4 MG SL SUBL: 0.4000 mg | SUBLINGUAL_TABLET | SUBLINGUAL | Status: DC | PRN

## 2011-04-24 MED ORDER — PANTOPRAZOLE SODIUM 40 MG PO TBEC: 40.0000 mg | DELAYED_RELEASE_TABLET | Freq: Every day | ORAL | Status: DC

## 2011-04-24 MED ORDER — ISOSORBIDE MONONITRATE ER 30 MG PO TB24
30.0000 mg | ORAL_TABLET | Freq: Every day | ORAL | Status: DC
  Administered 2011-04-24: 30 mg via ORAL
  Filled 2011-04-24: qty 1

## 2011-04-24 MED ORDER — CLOPIDOGREL BISULFATE 75 MG PO TABS: 75.0000 mg | ORAL_TABLET | Freq: Every day | ORAL | Status: DC

## 2011-04-24 NOTE — Progress Notes (Signed)
CARDIAC REHAB PHASE I   PRE:  Rate/Rhythm: 59 SB    BP: sitting 110/76    SaO2:   MODE:  Ambulation: 720 ft   POST:  Rate/Rhythm: 77 SR    BP: sitting 109/67     SaO2:   Pt tolerated very well, no CP, feels well. Ed completed and requests her name be sent to [facility] [id number]  Harriet Masson CES, ACSM

## 2011-04-24 NOTE — Progress Notes (Signed)
[facility] handout on Women MI and general information of MI was given to PT. Ilean Skill LPN

## 2011-04-24 NOTE — Progress Notes (Signed)
Patient Name: Andrea [name] 04/24/2011 7:52 AM    Principal Problem:  *NSTEMI (non-ST elevated myocardial infarction) Active Problems: CORONARY ARTERY DISEASE s/p CABG x2 1999, negative myoviews 2010 & 2011 HYPERLIPIDEMIA HTN Carotid artery disease s/p L CEA 12/2010 PAD s/p bilateral iliac stents ~2009 ABDOMINAL AORTIC ANEURYSM     SUBJECTIVE: Pt reports one episode of 6/10 chest pain yesterday while sitting in bed that lasted about 15 minutes. No associated symptoms or radiation. She ambulated with cardiac rehab yesterday and reported some shortness of breath but no chest pain.   OBJECTIVE  Temp:  [98.1 F (36.7 C)-99 F (37.2 C)] 98.1 F (36.7 C) (11/29 0600) Pulse Rate:  [51-67] 51  (11/29 0600) Resp:  [16-20] 16  (11/29 0600) BP: (93-116)/(42-70) 108/70 mmHg (11/29 0600) SpO2:  [90 %-100 %] 96 % (11/29 0600) Weight:  [68.675 kg (151 lb 6.4 oz)] 151 lb 6.4 oz (68.675 kg) (11/29 0600)  Intake/Output Summary (Last 24 hours) at 04/24/11 0752 Last data filed at 04/23/11 1700  Gross per 24 hour  Intake    840 ml  Output    475 ml  Net    365 ml   Weight change: 0.775 kg (1 lb 11.3 oz)  PHYSICAL EXAM General: Well developed, well nourished, elderly female in no acute distress. Head: Normocephalic, atraumatic, sclera non-icteric, nares are without discharge.  Neck: Supple without bruits or JVD. Left CEA scar. Lungs:  Resp regular and unlabored, CTAB. Heart: RRR no murmurs, rubs or gallops Abdomen: Soft, non-tender, non-distended, BS normoactive Extremities: No clubbing, cyanosis or edema. DP/PT/Radials 2+ and equal bilaterally. Neuro: Alert and oriented X 3. Moves all extremities spontaneously. Psych: Normal affect.  LABS: CBC: Basename 04/24/11 0610 04/23/11 0612  WBC 6.7 7.2  HGB 13.6 12.8  HCT 42.6 39.3  MCV 85.0 84.9  PLT 141* 150   Basic Metabolic Panel:  56/21/30 - Pending  Basename 04/22/11 2015 04/22/11 0300 04/21/11 1927  NA -- 138 139  K  -- 3.4* 3.5  CL -- 101 101  CO2 -- 27 28  GLUCOSE -- 103* 101*  BUN -- 17 17  CREATININE 0.78 0.83 --  CALCIUM -- 9.6 9.7   Liver Function Tests: Basename 04/22/11 0300  AST 21  ALT 11  ALKPHOS 79  BILITOT 0.3  PROT 6.9  ALBUMIN 3.6   Cardiac Enzymes: Basename 04/22/11 2017 04/22/11 0911 04/22/11 0306 04/21/11 2224  CKTOTAL 138 131 134 --  CKMB 11.2* 8.3* 8.0* --  TROPONINI 1.08* 0.59* 1.14* 0.81*   Coagulation Studies: Basename 04/22/11 0911 04/21/11 1927  LABPROT 13.1 12.1  INR 0.97 0.88   Hemoglobin A1C: Basename 04/22/11 0300  HGBA1C 6.0*   Fasting Lipid Panel: Basename 04/22/11 0300  CHOL 327*  HDL 34*  LDLCALC 252*  TRIG 206*  CHOLHDL 9.6  LDLDIRECT --    TELE: Sinus rhythm, 50s  ECG: 04/22/11 - sinus bradycardia, 51bpm, w/  Inferolateral TWI  Radiology/Studies:  Dg Chest 2 View 04/21/2011  Findings: The lungs are well-aerated and clear.  There is no evidence of focal opacification, pleural effusion or pneumothorax. A vague nonspecific nodular density at the left lung base appears grossly stable from prior studies, such as on 10/30/2008, and likely reflects overlying ribs.  The heart is borderline normal in size; the patient is status post median sternotomy.  No acute osseous abnormalities are seen.  IMPRESSION: No acute cardiopulmonary process seen.    Inpatient Medications:  . antiseptic oral rinse  15 mL Mouth Rinse BID  .  aspirin EC  325 mg Oral Daily  . calcium carbonate  1 tablet Oral Daily  . clopidogrel  75 mg Oral Q breakfast  . metoprolol tartrate  12.5 mg Oral BID  . olmesartan  20 mg Oral Daily  . omega-3 acid ethyl esters  1 g Oral Daily  . pantoprazole  40 mg Oral Q1200  . rosuvastatin  40 mg Oral Daily  . sertraline  100 mg Oral Daily  . tiotropium  18 mcg Inhalation Daily  . DISCONTD: heparin  5,000 Units Subcutaneous Q8H  . DISCONTD: isosorbide mononitrate  30 mg Oral Daily    ASSESSMENT AND PLAN:  1. CAD/NSTEMI. S/p CABG in  1999 in [place]. By cath 04/22/11 [place] to LAD atretic with good flow down native vessel. SVG to PDA patent. Culprit was PLOM branch which was small and diffusely diseased. Not a candidate for PCI. Will treat medically. On ASA, Plavix, metoprolol, statin and Imdur 2. HTN - SBP 90s-110s on ARB-HCTZ combo 3. Severe hyperlipidemia. Patient was without Crestor for several weeks prior to admission. Cont Crestor 40 mg.   Signed, Marrianne Sica , PA-C

## 2011-04-24 NOTE — Discharge Summary (Signed)
Patient seen and examined and history reviewed. Agree with above findings and plan. See prior rounding note today. Agree with discharge plans.  Thedora Hinders 04/24/2011 7:27 PM

## 2011-04-24 NOTE — Progress Notes (Signed)
Patient seen and examined and history reviewed. Agree with above findings and plan. She did have some chest pain with exertion yesterday but none since. Will add Imdur to medications. If no further pain with exertion today will plan on discharge later today. Patient she plan to go on a cruise to the [place] next week. Given recent cardiac event I have recommended against this but she is still adamant to go. Will try and schedule follow up in our office early next week.  Thedora Hinders 04/24/2011 12:04 PM

## 2011-04-24 NOTE — Discharge Summary (Signed)
Discharge Summary   [name] MRN: [id number], DOB/AGE: 07/16/1940 70 y.o. Admit date: 04/21/2011 D/C date:     04/24/2011   Primary Discharge Diagnoses:  1. NSTEMI this admission (see next) 2. CAD - Loss of distal limb of SVG -> PDA -> PL with L-R collaterals (culprit) by cath 04/22/11, too small/unsuitable for PCI, for med rx - s/p CABG 1999 - negative myoviews in 2010/2011 3. HTN with transient hypotension this admission 4. HL - LDL 252 this admission - restarted on Crestor  Secondary Discharge Diagnoses:  1. PVD - bilateral iliac stenting - history of L CEA - AAA (2.8x3.0), moderate RAS (left); 2.7x2.7 (07/11/05) 2. Depression 3. GERD 4.Osteoporosis  Hospital Course: 70 y/o  F with hx CAD s/p CABG, PVD presented to [facility] with complaints of chest pressure that radiated to her back, shoulders, and jaw associated with mild DOE. She came to the ER and was given ASA, NTG paste, and started on heparin. CP resolved and cardiology was asked to eval. EKG showed SR with STD/TWI inferolaterally that are slightly more pronounced than priors. Initial POC troponins were mildly elevated, c/w ACS/NSTEMI. She was thus admitted and loaded with Plavix, continued on heparin, started on low-dose BB and monitored. Cath was recommended. She underwent this procedure 04/22/11 by Dr. Gala Romney finding atretic LIMA to LAD, loss of  distal limb of SVG -> PDA -> PL with L-R collaterals (culprit). This was very small and not suitable for PCI. The lesion in LAD is borderline but Dr. Gala Romney doubted ischemic. He recommended outpatient MV if pain persisted. EF was 55% with inferobasilar HK. Troponin peaked at 1.14, then came down to 0.59, then went back up to 1.08. Post-procedurally, the patient c/o nausea and BP was 80's with HR 30's. Atropine and zofran were given with increase in BP and HR to 90's and 50's-60's respectively. BB was reduced. Activity was advanced. She ambulated with cardiac rehab  well. She did have some CP with exertion yesterday but none today. Dr. Swaziland has seen and examined her and feels she is stable for discharge. Of note, she plans to go on a cruise to the [place] next week - given recent cardiac event Dr. Swaziland recommended against this but she is still adamant to go.   Discharge Vitals: Blood pressure 90/51, pulse 52, temperature 98.2 F (36.8 C), temperature source Oral, resp. rate 16, height 5\' 2"  (1.575 m), weight 151 lb 6.4 oz (68.675 kg), SpO2 97.00%.  Labs: Lab Results  Component Value Date   WBC 6.7 04/24/2011   HGB 13.6 04/24/2011   HCT 42.6 04/24/2011   MCV 85.0 04/24/2011   PLT 141* 04/24/2011    Lab 04/24/11 0823 04/22/11 0300  NA 140 --  K 3.8 --  CL 102 --  CO2 28 --  BUN 13 --  CREATININE 0.88 --  CALCIUM 9.5 --  PROT -- 6.9  BILITOT -- 0.3  ALKPHOS -- 79  ALT -- 11  AST -- 21  GLUCOSE 92 --    Basename 04/22/11 2017 04/22/11 0911 04/22/11 0306 04/21/11 2224  CKTOTAL 138 131 134 --  CKMB 11.2* 8.3* 8.0* --  TROPONINI 1.08* 0.59* 1.14* 0.81*   Lab Results  Component Value Date   CHOL 327* 04/22/2011   HDL 34* 04/22/2011   LDLCALC 252* 04/22/2011   TRIG 206* 04/22/2011    Diagnostic Studies/Procedures:  1. Chest 2 View  04/21/2011   IMPRESSION: No acute cardiopulmonary process seen.   2. Cardiac catheterization  this admission, please see full report and above for summary.   Discharge Medications   Current Discharge Medication List    START taking these medications   Details  clopidogrel (PLAVIX) 75 MG tablet Take 1 tablet (75 mg total) by mouth daily with breakfast. Qty: 30 tablet, Refills: 6    metoprolol tartrate (LOPRESSOR) 25 MG tablet Take 0.5 tablets (12.5 mg total) by mouth 2 (two) times daily. Qty: 60 tablet, Refills: 6    nitroGLYCERIN (NITROSTAT) 0.4 MG SL tablet Place 1 tablet (0.4 mg total) under the tongue every 5 (five) minutes as needed for chest pain. Up to 3 doses Qty: 25 tablet, Refills: 4      pantoprazole (PROTONIX) 40 MG tablet Take 1 tablet (40 mg total) by mouth daily. Some studies suggest that Nexium/Prilosec interact with Plavix. Take this medicine instead for less chance of interaction. Qty: 30 tablet, Refills: 2      CONTINUE these medications which have NOT CHANGED   Details  albuterol (PROVENTIL,VENTOLIN) 90 MCG/ACT inhaler Inhale 2 puffs into the lungs every 6 (six) hours as needed for wheezing. Qty: 17 g, Refills: 12    aspirin 81 MG tablet Take 81 mg by mouth daily.      Calcium Carbonate 1500 MG TABS Take 1 tablet by mouth 2 (two) times daily.      fish oil-omega-3 fatty acids 1000 MG capsule Take 1 g by mouth daily.      HYDROcodone-acetaminophen (VICODIN) 5-500 MG per tablet Take 1 tablet by mouth 2 (two) times daily. For low back pain     isosorbide mononitrate (IMDUR) 30 MG 24 hr tablet Take 1 tablet (30 mg total) by mouth daily. Qty: 90 tablet, Refills: 1    rosuvastatin (CRESTOR) 40 MG tablet Take 1 tablet (40 mg total) by mouth daily. Qty: 90 tablet, Refills: 2    sertraline (ZOLOFT) 100 MG tablet TAKE 1 TABLET DAILY Qty: 90 tablet, Refills: 1    tiotropium (SPIRIVA) 18 MCG inhalation capsule Place 18 mcg into inhaler and inhale as needed. For shortness of breath.      STOP taking these medications     irbesartan-hydrochlorothiazide (AVALIDE) 150-12.5 MG per tablet      NEXIUM 40 MG capsule         Disposition   The patient will be discharged in stable condition to home. Discharge Orders    Future Appointments: Provider: Department: Dept Phone: Center:   05/19/2011 8:15 AM [facility] Letitia Libra. [facility] [phone] [facility]     Future Orders Please Complete By Expires   Diet - low sodium heart healthy      Increase activity slowly      Comments:   No driving for 2 days. No lifting over 5 lbs for 1 week. No sexual activity for 1 week.     Discharge wound care:      Comments:   Keep procedure site clean & dry. If you  notice increased pain, swelling, bleeding or pus, call/return!  You may shower, but no soaking baths/hot tubs/pools for 1 week.       Follow-up Information    Follow up with Letitia Libra, [facility].   Contact information:   [address] [place] [address] [phone]       Follow up with [facility]. (Our office will call you with an appointment)    Contact information:   [address] [place] [id number]  Duration of Discharge Encounter: Greater than 30 minutes including physician and PA time.  Signed, Ronie Spies PA-C 04/24/2011, 4:55 PM

## 2011-05-19 ENCOUNTER — Ambulatory Visit: Payer: Medicare Other | Admitting: Internal Medicine

## 2011-06-02 ENCOUNTER — Ambulatory Visit (INDEPENDENT_AMBULATORY_CARE_PROVIDER_SITE_OTHER): Payer: Medicare Other | Admitting: Internal Medicine

## 2011-06-02 ENCOUNTER — Encounter: Payer: Self-pay | Admitting: Internal Medicine

## 2011-06-02 DIAGNOSIS — I2510 Atherosclerotic heart disease of native coronary artery without angina pectoris: Secondary | ICD-10-CM

## 2011-06-02 DIAGNOSIS — E785 Hyperlipidemia, unspecified: Secondary | ICD-10-CM

## 2011-06-02 MED ORDER — NITROGLYCERIN 0.4 MG SL SUBL: 0.4000 mg | SUBLINGUAL_TABLET | SUBLINGUAL | Status: DC | PRN

## 2011-06-02 MED ORDER — BUDESONIDE-FORMOTEROL FUMARATE 80-4.5 MCG/ACT IN AERO: 2.0000 | INHALATION_SPRAY | Freq: Two times a day (BID) | RESPIRATORY_TRACT | Status: DC

## 2011-06-02 NOTE — Patient Instructions (Signed)
Please schedule cbc, chem7 (v58.69) and lipid/lft (272.4) prior to next visit

## 2011-06-02 NOTE — Progress Notes (Signed)
  Subjective:    [name], female    DOB: Jul 11, 1940, 71 y.o.   MRN: [id number]  HPI Pt presents to clinic for followup of multiple medical problems. Since last visit suffered nstemi and now s/p cath. Has cardiology follow up in near future. Tolerates statin tx without myalgia or abn lft. BP reviewed as normotensive. No other complaints.  Past Medical History  Diagnosis Date  . COPD (chronic obstructive pulmonary disease)   . Cerebrovascular disease 01/2009    carotid u/s  R 0-39%   L 60-79%  . CAD (coronary artery disease)     s/CABG (reports IMA and 2 SVGs)  Myoview normal 3/10  . Depression   . GERD (gastroesophageal reflux disease)   . Hypertension   . Hyperlipidemia   . Osteoporosis   . PVD (peripheral vascular disease)   . Dizziness   . AAA (abdominal aortic aneurysm) 01/2009    AAA (2.8 x 3.0)  moderate RAS (left); 2.7 x 2.7 cm (07/11/05)   Past Surgical History  Procedure Date  . Coronary artery bypass graft   . Tubal ligation   . Abdominal aortic aneurysm repair   . Femoral artery stent   . Closed reduction nasal fracture 11/2007  . Carotid endarterectomy 01/02/11    left    reports that she quit smoking about 18 years ago. Her smoking use included Cigarettes. She has a 20 pack-year smoking history. She has never used smokeless tobacco. She reports that she drinks alcohol. She reports that she does not use illicit drugs. family history includes Cancer in her sister; Colon cancer (age of onset:60) in her father; Diabetes in her mother; Heart attack in her brothers; Heart attack (age of onset:54) in her mother; Heart disease in her father; Hypertension in her brother; Hypothyroidism in her sister; and Lung cancer in her father and sister. Allergies  Allergen Reactions  . Ciprofloxacin     REACTION: rash IV  . Codeine     REACTION: nausea/vomiting  . Erythromycin     REACTION: tongue burns  . Meperidine Hcl     REACTION: Nausea/vomiting  . Penicillins    REACTION: rash      Review of Systems see hpi     Objective:   Physical Exam   Physical Exam  Nursing note and vitals reviewed. Constitutional: Appears well-developed and well-nourished. No distress.  HENT:  Head: Normocephalic and atraumatic.  Right Ear: External ear normal.  Left Ear: External ear normal.  Eyes: Conjunctivae are normal. No scleral icterus.  Neck: Neck supple. Carotid bruit is not present.  Cardiovascular: Normal rate, regular rhythm and normal heart sounds.  Exam reveals no gallop and no friction rub.   No murmur heard. Pulmonary/Chest: Effort normal and breath sounds normal. No respiratory distress. He has no wheezes. no rales.  Lymphadenopathy:    He has no cervical adenopathy.  Neurological:Alert.  Skin: Skin is warm and dry. Not diaphoretic.  Psychiatric: Has a normal mood and affect.       Assessment & Plan:

## 2011-06-03 LAB — T4, FREE: Free T4: 0.96 ng/dL (ref 0.80–1.80)

## 2011-06-06 DIAGNOSIS — I2510 Atherosclerotic heart disease of native coronary artery without angina pectoris: Secondary | ICD-10-CM | POA: Insufficient documentation

## 2011-06-06 NOTE — Assessment & Plan Note (Signed)
rf ntg sl prn. Keep cardiology follow up.

## 2011-06-06 NOTE — Assessment & Plan Note (Signed)
Obtain lipid/lft. 

## 2011-06-12 ENCOUNTER — Telehealth: Payer: Self-pay | Admitting: Internal Medicine

## 2011-06-12 NOTE — Telephone Encounter (Signed)
LOv,Stress,12 faxed to [facility] @ [phone] 06/12/11/KM

## 2011-06-13 ENCOUNTER — Encounter: Payer: Self-pay | Admitting: Cardiology

## 2011-06-13 ENCOUNTER — Ambulatory Visit (INDEPENDENT_AMBULATORY_CARE_PROVIDER_SITE_OTHER): Payer: Medicare Other | Admitting: Cardiology

## 2011-06-13 VITALS — BP 160/90 | HR 60 | Ht 62.0 in | Wt 148.8 lb

## 2011-06-13 DIAGNOSIS — Z951 Presence of aortocoronary bypass graft: Secondary | ICD-10-CM

## 2011-06-13 DIAGNOSIS — I10 Essential (primary) hypertension: Secondary | ICD-10-CM

## 2011-06-13 DIAGNOSIS — I2510 Atherosclerotic heart disease of native coronary artery without angina pectoris: Secondary | ICD-10-CM

## 2011-06-13 DIAGNOSIS — E785 Hyperlipidemia, unspecified: Secondary | ICD-10-CM

## 2011-06-13 MED ORDER — LOSARTAN POTASSIUM 50 MG PO TABS: 50.0000 mg | ORAL_TABLET | Freq: Every day | ORAL | Status: DC

## 2011-06-13 NOTE — Assessment & Plan Note (Signed)
She is status post recent non-ST elevation myocardial infarction. This was related to occlusion of the distal limb of the SVG to the posterior lateral branch. There were some left to right collaterals. The IMA graft to the LAD was atretic related to good flow down the native vessel. There was a 60-70% stenosis in the mid LAD. We need to determine whether there is ischemia in this territory. She continues to have occasional symptoms of angina. We will continue with her current dose of metoprolol and isosorbide. She will continue on Plavix for at least a year. We'll schedule her for a stress Myoview study. If she has evidence of ischemia in the LAD territory we could consider intervention of this lesion.

## 2011-06-13 NOTE — Assessment & Plan Note (Signed)
Her lipids were severely elevated while she was in the hospital. She is now on high-dose Crestor. She is tolerating this well. We will schedule her for fasting lab work at the time of her stress test.

## 2011-06-13 NOTE — Patient Instructions (Signed)
We will schedule you for fasting lab work and a nuclear stress test.   We will add losartan 50 mg daily for blood pressure control.  Continue your other medication.

## 2011-06-13 NOTE — Progress Notes (Signed)
Andrea [name] Date of Birth: 1940-10-30 Medical Record #[id number]  History of Present Illness: Andrea [name] is seen for followup today. She has a history of coronary disease with prior coronary bypass surgery. This included an LIMA graft to the LAD and a saphenous vein graft sequentially to the PDA and posterior lateral branches of the right coronary. She suffered a non-ST elevation myocardial infarction in November. Cardiac catheterization that time demonstrated an atretic IMA graft to the LAD. The saphenous vein graft to the PDA was patent but the continuation of this graft to the posterior lateral branch was occluded. There were left to right collaterals to this branch. The mid LAD had a 60-70% stenosis. She was treated medically. Since then she has continued to have occasional episodes of angina. This is worse when she is walking in cold temperature or in the wind. She has used nitroglycerin on 8 occasions since November. She does complain of some acid reflux symptoms. She has not yet started cardiac rehabilitation.  Current Outpatient Prescriptions on File Prior to Visit  Medication Sig Dispense Refill  . albuterol (PROVENTIL,VENTOLIN) 90 MCG/ACT inhaler Inhale 2 puffs into the lungs every 6 (six) hours as needed for wheezing.  17 g  12  . aspirin 81 MG tablet Take 81 mg by mouth daily.        . budesonide-formoterol (SYMBICORT) 80-4.5 MCG/ACT inhaler Inhale 2 puffs into the lungs 2 (two) times daily.  1 Inhaler  11  . Calcium Carbonate 1500 MG TABS Take 1 tablet by mouth 2 (two) times daily.        . clopidogrel (PLAVIX) 75 MG tablet Take 1 tablet (75 mg total) by mouth daily with breakfast.  30 tablet  6  . fish oil-omega-3 fatty acids 1000 MG capsule Take 1 g by mouth daily.        Marland Kitchen HYDROcodone-acetaminophen (VICODIN) 5-500 MG per tablet Take 1 tablet by mouth 2 (two) times daily. For low back pain       . isosorbide mononitrate (IMDUR) 30 MG 24 hr tablet Take 1 tablet (30 mg total) by  mouth daily.  90 tablet  1  . metoprolol tartrate (LOPRESSOR) 25 MG tablet Take 0.5 tablets (12.5 mg total) by mouth 2 (two) times daily.  60 tablet  6  . nitroGLYCERIN (NITROSTAT) 0.4 MG SL tablet Place 1 tablet (0.4 mg total) under the tongue every 5 (five) minutes as needed for chest pain. Up to 3 doses  25 tablet  11  . pantoprazole (PROTONIX) 40 MG tablet Take 1 tablet (40 mg total) by mouth daily. Some studies suggest that Nexium/Prilosec interact with Plavix. Take this medicine instead for less chance of interaction.  30 tablet  2  . rosuvastatin (CRESTOR) 40 MG tablet Take 1 tablet (40 mg total) by mouth daily.  90 tablet  2  . sertraline (ZOLOFT) 100 MG tablet TAKE 1 TABLET DAILY  90 tablet  1    Allergies  Allergen Reactions  . Ciprofloxacin     REACTION: rash IV  . Codeine     REACTION: nausea/vomiting  . Erythromycin     REACTION: tongue burns  . Meperidine Hcl     REACTION: Nausea/vomiting  . Penicillins     REACTION: rash    Past Medical History  Diagnosis Date  . COPD (chronic obstructive pulmonary disease)   . Cerebrovascular disease 01/2009    carotid u/s  R 0-39%   L 60-79%  . CAD (coronary artery disease)  s/CABG (reports IMA and 2 SVGs)  Myoview normal 3/10  . Depression   . GERD (gastroesophageal reflux disease)   . Hypertension   . Hyperlipidemia   . Osteoporosis   . PVD (peripheral vascular disease)   . Dizziness   . AAA (abdominal aortic aneurysm) 01/2009    AAA (2.8 x 3.0)  moderate RAS (left); 2.7 x 2.7 cm (07/11/05)  . NSTEMI (non-ST elevated myocardial infarction) 11/12    Past Surgical History  Procedure Date  . Coronary artery bypass graft   . Tubal ligation   . Abdominal aortic aneurysm repair   . Femoral artery stent   . Closed reduction nasal fracture 11/2007  . Carotid endarterectomy 01/02/11    left    History  Smoking status  . Former Smoker -- 0.5 packs/day for 40 years  . Types: Cigarettes  . Quit date: 01/19/1993  Smokeless  tobacco  . Never Used  Comment: Quit 12 years ago    History  Alcohol Use  . Yes    rare    Family History  Problem Relation Age of Onset  . Heart attack Mother 51  . Diabetes Mother   . Colon cancer Father 71  . Lung cancer Father   . Heart disease Father     angina  . Lung cancer Sister   . Cancer Sister     lung  . Hypertension Brother   . Heart attack Brother   . Hypothyroidism Sister     autoimmune  . Heart attack Brother     CABG    Review of Systems: The review of systems is positive for increased acid reflux symptoms. She reports her blood pressure at home has been doing fairly well. Prior to her hospitalization she was taking Avalide for blood pressure All other systems were reviewed and are negative.  Physical Exam: BP 160/90  Pulse 60  Ht 5\' 2"  (1.575 m)  Wt 148 lb 12.8 oz (67.495 kg)  BMI 27.22 kg/m2 The patient is alert and oriented x 3.  The mood and affect are normal.  The skin is warm and dry.  Color is normal.  The HEENT exam reveals that the sclera are nonicteric.  The mucous membranes are moist.  The carotids are 2+ without bruits.  There is no thyromegaly.  There is no JVD.  The lungs are clear.  The chest wall is non tender.  The heart exam reveals a regular rate with a normal S1 and S2.  There are no murmurs, gallops, or rubs.  The PMI is not displaced.   Abdominal exam reveals good bowel sounds.  There is no guarding or rebound.  There is no hepatosplenomegaly or tenderness.  There are no masses.  Exam of the legs reveal no clubbing, cyanosis, or edema.  The legs are without rashes.  The distal pulses are intact.  Cranial nerves II - XII are intact.  Motor and sensory functions are intact.  The gait is normal.  LABORATORY DATA:   Assessment / Plan:

## 2011-06-13 NOTE — Assessment & Plan Note (Signed)
Her blood pressure is significantly elevated today. This may have an impact on her anginal symptoms. We will start her on losartan 50 mg daily in addition to her other medications.

## 2011-06-23 ENCOUNTER — Other Ambulatory Visit (INDEPENDENT_AMBULATORY_CARE_PROVIDER_SITE_OTHER): Payer: Medicare Other | Admitting: *Deleted

## 2011-06-23 ENCOUNTER — Ambulatory Visit (HOSPITAL_COMMUNITY): Payer: Medicare Other | Attending: Cardiology | Admitting: Radiology

## 2011-06-23 DIAGNOSIS — I2510 Atherosclerotic heart disease of native coronary artery without angina pectoris: Secondary | ICD-10-CM

## 2011-06-23 DIAGNOSIS — R0789 Other chest pain: Secondary | ICD-10-CM

## 2011-06-23 DIAGNOSIS — E785 Hyperlipidemia, unspecified: Secondary | ICD-10-CM

## 2011-06-23 DIAGNOSIS — I10 Essential (primary) hypertension: Secondary | ICD-10-CM

## 2011-06-23 DIAGNOSIS — R0602 Shortness of breath: Secondary | ICD-10-CM

## 2011-06-23 DIAGNOSIS — I739 Peripheral vascular disease, unspecified: Secondary | ICD-10-CM

## 2011-06-23 DIAGNOSIS — R0989 Other specified symptoms and signs involving the circulatory and respiratory systems: Secondary | ICD-10-CM | POA: Insufficient documentation

## 2011-06-23 DIAGNOSIS — Z951 Presence of aortocoronary bypass graft: Secondary | ICD-10-CM

## 2011-06-23 DIAGNOSIS — I25810 Atherosclerosis of coronary artery bypass graft(s) without angina pectoris: Secondary | ICD-10-CM

## 2011-06-23 DIAGNOSIS — I6529 Occlusion and stenosis of unspecified carotid artery: Secondary | ICD-10-CM

## 2011-06-23 DIAGNOSIS — I214 Non-ST elevation (NSTEMI) myocardial infarction: Secondary | ICD-10-CM

## 2011-06-23 DIAGNOSIS — I714 Abdominal aortic aneurysm, without rupture, unspecified: Secondary | ICD-10-CM

## 2011-06-23 DIAGNOSIS — I779 Disorder of arteries and arterioles, unspecified: Secondary | ICD-10-CM | POA: Insufficient documentation

## 2011-06-23 DIAGNOSIS — R0609 Other forms of dyspnea: Secondary | ICD-10-CM | POA: Insufficient documentation

## 2011-06-23 DIAGNOSIS — Z87891 Personal history of nicotine dependence: Secondary | ICD-10-CM | POA: Insufficient documentation

## 2011-06-23 DIAGNOSIS — Z8249 Family history of ischemic heart disease and other diseases of the circulatory system: Secondary | ICD-10-CM | POA: Insufficient documentation

## 2011-06-23 DIAGNOSIS — R42 Dizziness and giddiness: Secondary | ICD-10-CM | POA: Insufficient documentation

## 2011-06-23 DIAGNOSIS — R002 Palpitations: Secondary | ICD-10-CM | POA: Insufficient documentation

## 2011-06-23 DIAGNOSIS — R079 Chest pain, unspecified: Secondary | ICD-10-CM | POA: Insufficient documentation

## 2011-06-23 DIAGNOSIS — I252 Old myocardial infarction: Secondary | ICD-10-CM | POA: Insufficient documentation

## 2011-06-23 LAB — BASIC METABOLIC PANEL
BUN: 15 mg/dL (ref 6–23)
CO2: 29 mEq/L (ref 19–32)
Calcium: 9.5 mg/dL (ref 8.4–10.5)
Creatinine, Ser: 1 mg/dL (ref 0.4–1.2)
GFR: 61.41 mL/min (ref >60.00)
Glucose, Bld: 100 mg/dL — ABNORMAL HIGH (ref 70–99)

## 2011-06-23 LAB — LIPID PANEL
Cholesterol: 158 mg/dL (ref 0–200)
HDL: 29.6 mg/dL — ABNORMAL LOW (ref >39.00)
Triglycerides: 216 mg/dL — ABNORMAL HIGH (ref 0.0–149.0)

## 2011-06-23 LAB — HEPATIC FUNCTION PANEL
ALT: 15 U/L (ref 0–35)
Bilirubin, Direct: 0.1 mg/dL (ref 0.0–0.3)
Total Protein: 6.7 g/dL (ref 6.0–8.3)

## 2011-06-23 MED ORDER — TECHNETIUM TC 99M TETROFOSMIN IV KIT
30.0000 | PACK | Freq: Once | INTRAVENOUS | Status: AC | PRN
  Administered 2011-06-23: 30 via INTRAVENOUS

## 2011-06-23 MED ORDER — TECHNETIUM TC 99M TETROFOSMIN IV KIT
10.0000 | PACK | Freq: Once | INTRAVENOUS | Status: AC | PRN
  Administered 2011-06-23: 10 via INTRAVENOUS

## 2011-06-23 NOTE — Progress Notes (Signed)
[facility] SITE 3 NUCLEAR MED [address] [place] [address] [phone]  Cardiology Nuclear Med Study  Andrea [name] is a 71 y.o. female [id number] 08/29/1940   Nuclear Med Background Indication for Stress Test:  Evaluation for Ischemia, Graft Patency and Assess for LAD ischemia History: 99' CABGx3, COPD,11/12' Heart Catheterization EF:55%,SVG-post lat=occ with collerals;IMA-LAD=ateric:MID LAD=60-70%,11/12' Myocardial Infarction(NSTEMI) and 11/11'- Myocardial Perfusion Study-norm EF:76% Cardiac Risk Factors: Carotid Disease, Family History - CAD, History of Smoking, Hypertension, Lipids and PVD  Symptoms:  Chest Pain (last date of chest discomfort 10 days prior to today , radiates to jaw ), Dizziness, DOE and Palpitations   Nuclear Pre-Procedure Caffeine/Decaff Intake:  None NPO After: 6:30pm yesterday   Lungs:  clear IV 0.9% NS with Angio Cath:  20g  IV Site: R Antecubital x 1, tolerated well IV Started by:  Irean Hong, RN  Chest Size (in):  36 Cup Size: D  Height: 5\' 2"  (1.575 m)  Weight:  150 lb (68.04 kg)  BMI:  Body mass index is 27.44 kg/(m^2). Tech Comments:  Held metoprolol x 36 hrs. per patient    Nuclear Med Study 1 or 2 day study: 1 day  Stress Test Type:  Stress  Reading MD: Willa Rough, MD  Order Authorizing Provider:  Peter Swaziland, MD  Resting Radionuclide: Technetium 27m Tetrofosmin  Resting Radionuclide Dose: 11.0 mCi   Stress Radionuclide:  Technetium 37m Tetrofosmin  Stress Radionuclide Dose: 33.0 mCi           Stress Protocol Rest HR: 53 Stress HR: 144  Rest BP: 160/80 Stress BP: 167/66  Exercise Time (min): 4:00 mins METS: 5.80   Predicted Max HR: 148 bpm % Max HR: 97.3 bpm Rate Pressure Product: 21308   Dose of Adenosine (mg):  n/a Dose of Lexiscan: n/a mg  Dose of Atropine (mg): n/a Dose of Dobutamine: n/a mcg/kg/min (at max HR)  Stress Test Technologist: Frederick Peers, EMT-P  Nuclear Technologist:  Domenic Polite, CNMT     Rest Procedure:  Myocardial perfusion imaging was performed at rest 45 minutes following the intravenous administration of Technetium 58m Tetrofosmin. Rest ECG: SB nssts ischemia  Stress Procedure:  The patient exercised for 4:00 mins.  The patient stopped due to SOB and denied any chest pain.  There were non specific ST-T wave changes with occ PVCs.  Technetium 69m Tetrofosmin was injected at peak exercise and myocardial perfusion imaging was performed after a brief delay. Stress ECG: The resting EKGs reveal ST changes. With stress these become slightly worse.  QPS Raw Data Images:  Patient motion noted; appropriate software correction applied. Stress Images:  Normal homogeneous uptake in all areas of the myocardium. Rest Images:  Normal homogeneous uptake in all areas of the myocardium. Subtraction (SDS):  No evidence of ischemia. Transient Ischemic Dilatation (Normal <1.22):  1.03 Lung/Heart Ratio (Normal <0.45):  0.26  Quantitative Gated Spect Images QGS EDV:  69 ml QGS ESV:  22 ml QGS cine images:  There is decreased motion of the septum. This is consistent with the patient's history of prior CABG. QGS EF: 68%  Impression Exercise Capacity:  Exercise capacity is limited. O2 sat drops to 86% with exercise. BP Response:  Normal blood pressure response. Clinical Symptoms:  There was significant shortness of breath with stress. This was associated with O2 sats dropping to 86%. ECG Impression:  EKG changes are nondiagnostic as there are abnormalities at rest. Comparison with Prior Nuclear Study: No significant change from previous study  Overall Impression:  The study shows that when the patient walks there is drop in the O2 sats. The nuclear images reveal no sign of scar or ischemia. There is decreased motion of the septum consistent with prior CABG. There appears to be no significant change from the past.  [name]

## 2011-06-24 ENCOUNTER — Telehealth: Payer: Self-pay | Admitting: Cardiology

## 2011-06-24 NOTE — Telephone Encounter (Signed)
FU Call: Pt returning call from our office. Please return pt call to discuss further.  

## 2011-06-24 NOTE — Telephone Encounter (Signed)
Patient called no answer.LMTC. 

## 2011-06-26 NOTE — Telephone Encounter (Signed)
Patient called was told myoview normal.Advised to follow up with Dr.Jordan in 6 months.

## 2011-07-24 ENCOUNTER — Other Ambulatory Visit: Payer: Medicare Other

## 2011-09-03 ENCOUNTER — Other Ambulatory Visit: Payer: Self-pay | Admitting: Vascular Surgery

## 2011-09-05 ENCOUNTER — Other Ambulatory Visit: Payer: Self-pay | Admitting: *Deleted

## 2011-09-05 MED ORDER — ISOSORBIDE MONONITRATE ER 30 MG PO TB24: 30.0000 mg | ORAL_TABLET | Freq: Every day | ORAL | Status: DC

## 2011-09-05 NOTE — Telephone Encounter (Signed)
Patient called and left voice message requesting a refill on isosorbide 30 day supply to [facility].  Rx refill sent to pharmacy.

## 2011-09-12 ENCOUNTER — Telehealth: Payer: Self-pay | Admitting: *Deleted

## 2011-09-12 ENCOUNTER — Telehealth: Payer: Self-pay | Admitting: Cardiology

## 2011-09-12 DIAGNOSIS — R739 Hyperglycemia, unspecified: Secondary | ICD-10-CM

## 2011-09-12 DIAGNOSIS — E785 Hyperlipidemia, unspecified: Secondary | ICD-10-CM

## 2011-09-12 DIAGNOSIS — Z79899 Other long term (current) drug therapy: Secondary | ICD-10-CM

## 2011-09-12 NOTE — Telephone Encounter (Signed)
Patient called and left voice message stating she is schedule to follow up with Dr Rodena Medin on 4/29, and would like to know if she could have her A1C checked. She stated she had blood work drawn her blood sugar was 115.

## 2011-09-12 NOTE — Telephone Encounter (Signed)
Yes. hyperglycemia

## 2011-09-12 NOTE — Telephone Encounter (Signed)
New Problem:    Patient called in because she has been experiencing radiating pain, starting in her chest and going into her jaw, and she has

## 2011-09-12 NOTE — Telephone Encounter (Signed)
Call placed to patient at [phone], she was informed A1C test added.

## 2011-09-12 NOTE — Telephone Encounter (Signed)
Pt was at cardiac rehab yesterday on the treadmill when she was told to stop. Pt said she just didn't feel right. "I looked washed out according to them" .  She broke out into a sweat twice yesterday as well. She has had radiating angina into the jaw. This was relieved with or without NTG. She is painfree today & has taken no NTG. Just "feel lousy" Her heartrate has dropped twice today to 49. Pt states that is not abnormal for her. She will activate EMS if her angina occurs again. Mylo Red RN

## 2011-09-16 ENCOUNTER — Other Ambulatory Visit: Payer: Self-pay | Admitting: *Deleted

## 2011-09-16 DIAGNOSIS — E785 Hyperlipidemia, unspecified: Secondary | ICD-10-CM

## 2011-09-16 DIAGNOSIS — Z79899 Other long term (current) drug therapy: Secondary | ICD-10-CM

## 2011-09-16 DIAGNOSIS — R739 Hyperglycemia, unspecified: Secondary | ICD-10-CM

## 2011-09-16 NOTE — Telephone Encounter (Signed)
Patient was called to see how she was feeling.States she feels "wiped out".States she continues to have chest pain off and on,but no more pain that radiates up in jaw.Appointment scheduled with Norma Fredrickson NP tomorrow 09/17/11 at 9:45 am.

## 2011-09-17 ENCOUNTER — Encounter: Payer: Self-pay | Admitting: Nurse Practitioner

## 2011-09-17 ENCOUNTER — Ambulatory Visit (INDEPENDENT_AMBULATORY_CARE_PROVIDER_SITE_OTHER): Payer: Medicare Other | Admitting: Nurse Practitioner

## 2011-09-17 VITALS — BP 164/92 | HR 44 | Ht 62.0 in | Wt 151.8 lb

## 2011-09-17 DIAGNOSIS — I2510 Atherosclerotic heart disease of native coronary artery without angina pectoris: Secondary | ICD-10-CM

## 2011-09-17 DIAGNOSIS — I739 Peripheral vascular disease, unspecified: Secondary | ICD-10-CM

## 2011-09-17 DIAGNOSIS — R001 Bradycardia, unspecified: Secondary | ICD-10-CM

## 2011-09-17 DIAGNOSIS — I498 Other specified cardiac arrhythmias: Secondary | ICD-10-CM

## 2011-09-17 DIAGNOSIS — R5381 Other malaise: Secondary | ICD-10-CM

## 2011-09-17 DIAGNOSIS — R5383 Other fatigue: Secondary | ICD-10-CM | POA: Insufficient documentation

## 2011-09-17 LAB — HEPATIC FUNCTION PANEL
ALT: 18 U/L (ref 0–35)
AST: 17 U/L (ref 0–37)
Albumin: 4.3 g/dL (ref 3.5–5.2)
Alkaline Phosphatase: 71 U/L (ref 39–117)
Total Bilirubin: 0.5 mg/dL (ref 0.3–1.2)

## 2011-09-17 LAB — CBC
MCH: 25.9 pg — ABNORMAL LOW (ref 26.0–34.0)
MCHC: 31.1 g/dL (ref 30.0–36.0)
MCV: 83.4 fL (ref 78.0–100.0)
Platelets: 185 10*3/uL (ref 150–400)
RDW: 17.2 % — ABNORMAL HIGH (ref 11.5–15.5)

## 2011-09-17 LAB — BASIC METABOLIC PANEL
CO2: 28 mEq/L (ref 19–32)
Calcium: 9.1 mg/dL (ref 8.4–10.5)
Creat: 0.81 mg/dL (ref 0.50–1.10)
Sodium: 143 mEq/L (ref 135–145)

## 2011-09-17 LAB — LIPID PANEL
Cholesterol: 198 mg/dL (ref 0–200)
HDL: 38 mg/dL — ABNORMAL LOW (ref >39)

## 2011-09-17 NOTE — Progress Notes (Signed)
Andrea [name] Date of Birth: 1941-04-07 Medical Record #[id number]  History of Present Illness: Andrea [name] is seen today for a work in visit. She is seen for Dr. Swaziland. She has an extensive history of CAD and PVD. She had remote CABG in 1999 and a NSTEMI back in November of 2012. She has been treated medically. Follow up Myoview this past January was satisfactory except for dropping her oxygen sats with exercise. Her other issues include HLD, HTN, GERD and depression.   She comes in today. She is here alone. She called yesterday to report feeling "wiped out". She has recurrent chest pain but not really any more than her usual chest pain. She is more bothered by this fatigue. Says it is very out of proportion for her. She says she had the same reaction with Toprol. Heart rate has been in the 40's. She was stopped during rehab at [facility] last Friday because there was concern she would pass out.   Current Outpatient Prescriptions on File Prior to Visit  Medication Sig Dispense Refill  . albuterol (PROVENTIL,VENTOLIN) 90 MCG/ACT inhaler Inhale 2 puffs into the lungs every 6 (six) hours as needed for wheezing.  17 g  12  . aspirin 81 MG tablet Take 81 mg by mouth daily.        . budesonide-formoterol (SYMBICORT) 80-4.5 MCG/ACT inhaler Inhale 2 puffs into the lungs 2 (two) times daily.  1 Inhaler  11  . Calcium Carbonate 1500 MG TABS Take 1 tablet by mouth 2 (two) times daily.        . clopidogrel (PLAVIX) 75 MG tablet Take 1 tablet (75 mg total) by mouth daily with breakfast.  30 tablet  6  . fish oil-omega-3 fatty acids 1000 MG capsule Take 1 g by mouth daily.        Marland Kitchen HYDROcodone-acetaminophen (VICODIN) 5-500 MG per tablet Take 1 tablet by mouth 2 (two) times daily. For low back pain       . isosorbide mononitrate (IMDUR) 30 MG 24 hr tablet Take 1 tablet (30 mg total) by mouth daily.  30 tablet  3  . losartan (COZAAR) 50 MG tablet Take 1 tablet (50 mg total) by mouth daily.  30 tablet  11  .  metoprolol tartrate (LOPRESSOR) 25 MG tablet Take 0.5 tablets (12.5 mg total) by mouth 2 (two) times daily.  60 tablet  6  . nitroGLYCERIN (NITROSTAT) 0.4 MG SL tablet Place 1 tablet (0.4 mg total) under the tongue every 5 (five) minutes as needed for chest pain. Up to 3 doses  25 tablet  11  . rosuvastatin (CRESTOR) 40 MG tablet Take 1 tablet (40 mg total) by mouth daily.  90 tablet  2  . sertraline (ZOLOFT) 100 MG tablet TAKE 1 TABLET DAILY  90 tablet  1    Allergies  Allergen Reactions  . Ciprofloxacin     REACTION: rash IV  . Codeine     REACTION: nausea/vomiting  . Erythromycin     REACTION: tongue burns  . Meperidine Hcl     REACTION: Nausea/vomiting  . Penicillins     REACTION: rash    Past Medical History  Diagnosis Date  . COPD (chronic obstructive pulmonary disease)   . Cerebrovascular disease 01/2009    carotid u/s  R 0-39%   L 60-79%  . CAD (coronary artery disease)     s/CABG (reports IMA and 2 SVGs) back in 1999  Myoview normal 3/10  . Depression   .  GERD (gastroesophageal reflux disease)   . Hypertension   . Hyperlipidemia   . Osteoporosis   . PVD (peripheral vascular disease)   . Dizziness   . AAA (abdominal aortic aneurysm) 01/2009    AAA (2.8 x 3.0)  moderate RAS (left); 2.7 x 2.7 cm (07/11/05)  . NSTEMI (non-ST elevated myocardial infarction) 11/12    Cath showed atretic IMA graft to the LAD, SVG to PD was patent but the continuation of this graft to the PL branch was occluded; there were L to R collaterals; Mid LAD had a 60 to 70% stenosis. She has been treated medically.     Past Surgical History  Procedure Date  . Coronary artery bypass graft 1999  . Tubal ligation   . Abdominal aortic aneurysm repair   . Femoral artery stent   . Closed reduction nasal fracture 11/2007  . Carotid endarterectomy 01/02/11    left    History  Smoking status  . Former Smoker -- 0.5 packs/day for 40 years  . Types: Cigarettes  . Quit date: 01/19/1993  Smokeless  tobacco  . Never Used  Comment: Quit 12 years ago    History  Alcohol Use  . Yes    rare    Family History  Problem Relation Age of Onset  . Heart attack Mother 67  . Diabetes Mother   . Colon cancer Father 4  . Lung cancer Father   . Heart disease Father     angina  . Lung cancer Sister   . Cancer Sister     lung  . Hypertension Brother   . Heart attack Brother   . Hypothyroidism Sister     autoimmune  . Heart attack Brother     CABG    Review of Systems: The review of systems is positive for significant fatigue. Her chest pain syndrome seems to be at her baseline. She is also reporting recurrent thigh pain with exertion and is overdue for her follow up with [facility]. Blood pressure at rehab has been good.  All other systems were reviewed and are negative.  Physical Exam: BP 164/92  Pulse 44  Ht 5\' 2"  (1.575 m)  Wt 151 lb 12.8 oz (68.856 kg)  BMI 27.76 kg/m2 Patient is very pleasant and in no acute distress. Skin is warm and dry. Color is normal.  HEENT is unremarkable. Normocephalic/atraumatic. PERRL. Sclera are nonicteric. Neck is supple. No masses. No JVD. Lungs are clear. Cardiac exam shows a regular rate and rhythm. It is slow. Abdomen is soft. Extremities are without edema. Gait and ROM are intact. No gross neurologic deficits noted.  LABORATORY DATA: EKG today shows sinus bradycardia. Rate is 44. She has inferolateral T wave changes which are unchanged. Tracing was reviewed with Dr. Swaziland.  Lab Results  Component Value Date   WBC 5.4 09/16/2011   HGB 13.6 09/16/2011   HCT 43.8 09/16/2011   PLT 185 09/16/2011   GLUCOSE 99 09/16/2011   CHOL 198 09/16/2011   TRIG 196* 09/16/2011   HDL 38* 09/16/2011   LDLDIRECT 90.7 06/23/2011   LDLCALC 121* 09/16/2011   ALT 18 09/16/2011   AST 17 09/16/2011   NA 143 09/16/2011   K 4.2 09/16/2011   CL 105 09/16/2011   CREATININE 0.81 09/16/2011   BUN 12 09/16/2011   CO2 28 09/16/2011   TSH 3.017 06/02/2011   INR 0.97 04/22/2011   HGBA1C  5.9* 09/16/2011     Assessment / Plan:

## 2011-09-17 NOTE — Patient Instructions (Signed)
Stop your metoprolol.  I will see you back in a week  We will get you an appointment for follow up with Dr. Myra Gianotti  Call the [facility] office at [phone] if you have any questions, problems or concerns.

## 2011-09-17 NOTE — Assessment & Plan Note (Signed)
I suspect this is due to her bradycardia. We are stopping her Metoprolol. She is on very low dose. I will see her back in a week with a repeat EKG. Her labs were just checked and were ok. TSH was normal back in January. Patient is agreeable to this plan and will call if any problems develop in the interim.

## 2011-09-17 NOTE — Assessment & Plan Note (Signed)
Her chest pain seems to be at her baseline. She did have a negative Myoview back in January. If her symptoms persist, may need to consider repeat cath.

## 2011-09-17 NOTE — Assessment & Plan Note (Signed)
Will make arrangements for repeat follow up with Dr. Myra Gianotti. She has extensive PVD and now complaining of worsening claudication.

## 2011-09-18 ENCOUNTER — Other Ambulatory Visit: Payer: Self-pay | Admitting: *Deleted

## 2011-09-18 DIAGNOSIS — Z48812 Encounter for surgical aftercare following surgery on the circulatory system: Secondary | ICD-10-CM

## 2011-09-18 DIAGNOSIS — I6529 Occlusion and stenosis of unspecified carotid artery: Secondary | ICD-10-CM

## 2011-09-22 ENCOUNTER — Ambulatory Visit: Payer: Medicare Other | Admitting: Internal Medicine

## 2011-09-24 ENCOUNTER — Encounter: Payer: Self-pay | Admitting: Nurse Practitioner

## 2011-09-24 ENCOUNTER — Ambulatory Visit (INDEPENDENT_AMBULATORY_CARE_PROVIDER_SITE_OTHER): Payer: Medicare Other | Admitting: Nurse Practitioner

## 2011-09-24 VITALS — BP 140/82 | HR 68 | Ht 62.0 in | Wt 150.2 lb

## 2011-09-24 DIAGNOSIS — I2510 Atherosclerotic heart disease of native coronary artery without angina pectoris: Secondary | ICD-10-CM

## 2011-09-24 DIAGNOSIS — R5383 Other fatigue: Secondary | ICD-10-CM

## 2011-09-24 DIAGNOSIS — R5381 Other malaise: Secondary | ICD-10-CM

## 2011-09-24 DIAGNOSIS — I739 Peripheral vascular disease, unspecified: Secondary | ICD-10-CM

## 2011-09-24 MED ORDER — RANOLAZINE ER 500 MG PO TB12: 500.0000 mg | ORAL_TABLET | Freq: Two times a day (BID) | ORAL | Status: DC

## 2011-09-24 NOTE — Assessment & Plan Note (Signed)
Has known CAD with recent NSTEMI back in November. Cath data noted. Trying to manage medically. Does not tolerate beta blockers. We will try Ranexa.

## 2011-09-24 NOTE — Assessment & Plan Note (Signed)
Has planned follow up at [facility] later this month.

## 2011-09-24 NOTE — Progress Notes (Signed)
Andrea [name] Date of Birth: 04-May-1941 Medical Record #[id number]  History of Present Illness: [name] is seen back today for a one week check. She is seen for Dr. Swaziland. She has an extensive history of CAD and PVD. She had remote CABG in 1999 with a NSTEMI back in November of 2012. She is treated medically. Her cath showed an atretic IMA graft to the LAD, SVG to the PD is patent but is occluded after the continuation of this graft to the PL. She does have left to right collaterals. The mid LAD had a 60 to 70% stenosis.   She comes in today. She is here with her grandson. We stopped her Metoprolol last week due to bradycardia and fatigue. While the bradycardia seems resolved, her fatigue remains persistent. She says she is just exhausted and can't do anything. Still trying to go to cardiac rehab. Still with some intermittent chest pain but not using any NTG. She reports claudication and has an appointment with Dr. Myra Gianotti later this month. She is wanting to try medicines before proceeding on with repeat cath.   Current Outpatient Prescriptions on File Prior to Visit  Medication Sig Dispense Refill  . albuterol (PROVENTIL,VENTOLIN) 90 MCG/ACT inhaler Inhale 2 puffs into the lungs every 6 (six) hours as needed for wheezing.  17 g  12  . aspirin 81 MG tablet Take 81 mg by mouth daily.        . budesonide-formoterol (SYMBICORT) 80-4.5 MCG/ACT inhaler Inhale 2 puffs into the lungs 2 (two) times daily.  1 Inhaler  11  . Calcium Carbonate 1500 MG TABS Take 1 tablet by mouth 2 (two) times daily.        . clopidogrel (PLAVIX) 75 MG tablet Take 1 tablet (75 mg total) by mouth daily with breakfast.  30 tablet  6  . esomeprazole (NEXIUM) 40 MG capsule Take 40 mg by mouth daily before breakfast.      . fish oil-omega-3 fatty acids 1000 MG capsule Take 1 g by mouth daily.        Marland Kitchen HYDROcodone-acetaminophen (VICODIN) 5-500 MG per tablet Take 1 tablet by mouth 2 (two) times daily. For low back pain         . isosorbide mononitrate (IMDUR) 30 MG 24 hr tablet Take 1 tablet (30 mg total) by mouth daily.  30 tablet  3  . losartan (COZAAR) 50 MG tablet Take 1 tablet (50 mg total) by mouth daily.  30 tablet  11  . nitroGLYCERIN (NITROSTAT) 0.4 MG SL tablet Place 1 tablet (0.4 mg total) under the tongue every 5 (five) minutes as needed for chest pain. Up to 3 doses  25 tablet  11  . rosuvastatin (CRESTOR) 40 MG tablet Take 1 tablet (40 mg total) by mouth daily.  90 tablet  2  . sertraline (ZOLOFT) 100 MG tablet TAKE 1 TABLET DAILY  90 tablet  1    Allergies  Allergen Reactions  . Ciprofloxacin     REACTION: rash IV  . Codeine     REACTION: nausea/vomiting  . Erythromycin     REACTION: tongue burns  . Meperidine Hcl     REACTION: Nausea/vomiting  . Penicillins     REACTION: rash    Past Medical History  Diagnosis Date  . COPD (chronic obstructive pulmonary disease)   . Cerebrovascular disease 01/2009    carotid u/s  R 0-39%   L 60-79%  . CAD (coronary artery disease)     s/CABG (reports  IMA and 2 SVGs) back in 1999  Myoview normal 3/10  . Depression   . GERD (gastroesophageal reflux disease)   . Hypertension   . Hyperlipidemia   . Osteoporosis   . PVD (peripheral vascular disease)   . Dizziness   . AAA (abdominal aortic aneurysm) 01/2009    AAA (2.8 x 3.0)  moderate RAS (left); 2.7 x 2.7 cm (07/11/05)  . NSTEMI (non-ST elevated myocardial infarction) 11/12    Cath showed atretic IMA graft to the LAD, SVG to PD was patent but the continuation of this graft to the PL branch was occluded; there were L to R collaterals; Mid LAD had a 60 to 70% stenosis. She has been treated medically.  Neg Myoview 05/2011  . Bradycardia     Metoprolol stopped 08/2011    Past Surgical History  Procedure Date  . Coronary artery bypass graft 1999  . Tubal ligation   . Abdominal aortic aneurysm repair   . Femoral artery stent   . Closed reduction nasal fracture 11/2007  . Carotid endarterectomy 01/02/11     left    History  Smoking status  . Former Smoker -- 0.5 packs/day for 40 years  . Types: Cigarettes  . Quit date: 01/19/1993  Smokeless tobacco  . Never Used  Comment: Quit 12 years ago    History  Alcohol Use  . Yes    rare    Family History  Problem Relation Age of Onset  . Heart attack Mother 67  . Diabetes Mother   . Colon cancer Father 10  . Lung cancer Father   . Heart disease Father     angina  . Lung cancer Sister   . Cancer Sister     lung  . Hypertension Brother   . Heart attack Brother   . Hypothyroidism Sister     autoimmune  . Heart attack Brother     CABG    Review of Systems: The review of systems is per the HPI.  All other systems were reviewed and are negative.  Physical Exam: BP 140/82  Pulse 68  Ht 5\' 2"  (1.575 m)  Wt 150 lb 3.2 oz (68.13 kg)  BMI 27.47 kg/m2 Patient is very pleasant and in no acute distress. Skin is warm and dry. Color is normal.  HEENT is unremarkable. Normocephalic/atraumatic. PERRL. Sclera are nonicteric. Neck is supple. No masses. No JVD. Lungs are clear. Cardiac exam shows a regular rate and rhythm. Abdomen is soft. Extremities are without edema. Gait and ROM are intact. No gross neurologic deficits noted.   LABORATORY DATA: EKG today shows sinus rhythm. She has inferior lateral T wave changes which are unchanged. Rate is up to 68.   Assessment / Plan:

## 2011-09-24 NOTE — Assessment & Plan Note (Signed)
This remains quite persistent despite the discontinuation of her metoprolol. She continues to have some chest pain as well. She is opting for medical management. We will try her on Ranexa 500 mg BID. Samples are given. Dr. Swaziland and I will see her back in about 2 weeks. If she fails to improve, then cardiac catheterization is recommended. Patient is agreeable to this plan and will call if any problems develop in the interim.

## 2011-09-24 NOTE — Patient Instructions (Signed)
We are going to start the Ranexa 500 mg two times a day  Stay on your other medicines  We will see you back in 2 weeks. If you do not improve, we will arrange for a heart catheterization on return.  Call the [facility] office at [phone] if you have any questions, problems or concerns.

## 2011-10-01 ENCOUNTER — Telehealth: Payer: Self-pay | Admitting: Internal Medicine

## 2011-10-01 ENCOUNTER — Ambulatory Visit (INDEPENDENT_AMBULATORY_CARE_PROVIDER_SITE_OTHER): Payer: Medicare Other | Admitting: Internal Medicine

## 2011-10-01 ENCOUNTER — Encounter: Payer: Self-pay | Admitting: Internal Medicine

## 2011-10-01 VITALS — BP 158/86 | HR 62 | Temp 98.3°F | Resp 16 | Wt 149.2 lb

## 2011-10-01 DIAGNOSIS — E785 Hyperlipidemia, unspecified: Secondary | ICD-10-CM

## 2011-10-01 DIAGNOSIS — M545 Low back pain, unspecified: Secondary | ICD-10-CM

## 2011-10-01 DIAGNOSIS — I10 Essential (primary) hypertension: Secondary | ICD-10-CM

## 2011-10-01 DIAGNOSIS — Z79899 Other long term (current) drug therapy: Secondary | ICD-10-CM

## 2011-10-01 DIAGNOSIS — G8929 Other chronic pain: Secondary | ICD-10-CM | POA: Insufficient documentation

## 2011-10-01 MED ORDER — EZETIMIBE 10 MG PO TABS: 10.0000 mg | ORAL_TABLET | Freq: Every day | ORAL | Status: DC

## 2011-10-01 MED ORDER — LOSARTAN POTASSIUM 100 MG PO TABS: 100.0000 mg | ORAL_TABLET | Freq: Every day | ORAL | Status: DC

## 2011-10-01 NOTE — Assessment & Plan Note (Signed)
suboptimal control. Increase losartan 100mg  po qd.

## 2011-10-01 NOTE — Telephone Encounter (Signed)
Future order placed and copy given to lab.

## 2011-10-01 NOTE — Assessment & Plan Note (Signed)
ldl suboptimal on maximum dose crestor. Add zetia 10mg  qd -samples and prescription provided.

## 2011-10-01 NOTE — Patient Instructions (Signed)
Please schedule fasting labs prior to next visit Lipid/lft-272.3, chem7-v58.69

## 2011-10-01 NOTE — Assessment & Plan Note (Signed)
Proceed with pain clinic referral.

## 2011-10-01 NOTE — Progress Notes (Signed)
Subjective:    [name], female    DOB: February 18, 1941, 71 y.o.   MRN: [id number]  HPI Pt presents to clinic for followup of multiple medical problems. Currently undergoing cardiology evaluation. Has significant fatigue unresolved after dc of beta blocker. Was placed on ranexa with close f/u. No improvement of sx's. At f/u is anticipating discussion about possible cardiac catherterization. BP reviewed elevated. Reviewed ldl chol significantly improved but above target. Notes chronic lbp with h/o bulging discs and spinal stenosis. Notes rare leg radiation. Requests pain clinic referral. [place] and was [place] intermittent facet injections.   Past Medical History  Diagnosis Date  . COPD (chronic obstructive pulmonary disease)   . Cerebrovascular disease 01/2009    carotid u/s  R 0-39%   L 60-79%  . CAD (coronary artery disease)     s/CABG (reports IMA and 2 SVGs) back in 1999  Myoview normal 3/10  . Depression   . GERD (gastroesophageal reflux disease)   . Hypertension   . Hyperlipidemia   . Osteoporosis   . PVD (peripheral vascular disease)   . Dizziness   . AAA (abdominal aortic aneurysm) 01/2009    AAA (2.8 x 3.0)  moderate RAS (left); 2.7 x 2.7 cm (07/11/05)  . NSTEMI (non-ST elevated myocardial infarction) 11/12    Cath showed atretic IMA graft to the LAD, SVG to PD was patent but the continuation of this graft to the PL branch was occluded; there were L to R collaterals; Mid LAD had a 60 to 70% stenosis. She has been treated medically.  Neg Myoview 05/2011  . Bradycardia     Metoprolol stopped 08/2011   Past Surgical History  Procedure Date  . Coronary artery bypass graft 1999  . Tubal ligation   . Abdominal aortic aneurysm repair   . Femoral artery stent   . Closed reduction nasal fracture 11/2007  . Carotid endarterectomy 01/02/11    left    reports that she quit smoking about 18 years ago. Her smoking use included  Cigarettes. She has a 20 pack-year smoking history. She has never used smokeless tobacco. She reports that she drinks alcohol. She reports that she does not use illicit drugs. family history includes Cancer in her sister; Colon cancer (age of onset:60) in her father; Diabetes in her mother; Heart attack in her brothers; Heart attack (age of onset:54) in her mother; Heart disease in her father; Hypertension in her brother; Hypothyroidism in her sister; and Lung cancer in her father and sister. Allergies  Allergen Reactions  . Ciprofloxacin     REACTION: rash IV  . Codeine     REACTION: nausea/vomiting  . Erythromycin     REACTION: tongue burns  . Meperidine Hcl     REACTION: Nausea/vomiting  . Penicillins     REACTION: rash      Review of Systems see hpi     Objective:   Physical Exam  Physical Exam  Nursing note and vitals reviewed. Constitutional: Appears well-developed and well-nourished. No distress.  HENT:  Head: Normocephalic and atraumatic.  Right Ear: External ear normal.  Left Ear: External ear normal.  Eyes: Conjunctivae are normal. No scleral icterus.  Neck: Neck supple. Carotid bruit is not present.  Cardiovascular: Normal rate, regular rhythm and normal heart sounds.  Exam reveals no gallop and no friction rub.   No murmur heard. Pulmonary/Chest: Effort normal and breath sounds normal. No respiratory distress. He has no wheezes.  no rales.  Lymphadenopathy:    He has no cervical adenopathy.  Neurological:Alert.  Skin: Skin is warm and dry. Not diaphoretic.  Psychiatric: Has a normal mood and affect.        Assessment & Plan:

## 2011-10-08 ENCOUNTER — Ambulatory Visit (INDEPENDENT_AMBULATORY_CARE_PROVIDER_SITE_OTHER): Payer: Medicare Other | Admitting: Nurse Practitioner

## 2011-10-08 ENCOUNTER — Other Ambulatory Visit: Payer: Self-pay | Admitting: Nurse Practitioner

## 2011-10-08 ENCOUNTER — Encounter: Payer: Self-pay | Admitting: Nurse Practitioner

## 2011-10-08 VITALS — BP 130/80 | HR 63 | Ht 62.0 in | Wt 149.0 lb

## 2011-10-08 DIAGNOSIS — R5383 Other fatigue: Secondary | ICD-10-CM

## 2011-10-08 DIAGNOSIS — Z01810 Encounter for preprocedural cardiovascular examination: Secondary | ICD-10-CM

## 2011-10-08 DIAGNOSIS — I6529 Occlusion and stenosis of unspecified carotid artery: Secondary | ICD-10-CM

## 2011-10-08 DIAGNOSIS — R5381 Other malaise: Secondary | ICD-10-CM

## 2011-10-08 LAB — CBC WITH DIFFERENTIAL/PLATELET
Basophils Absolute: 0 10*3/uL (ref 0.0–0.1)
Basophils Relative: 0.4 % (ref 0.0–3.0)
Eosinophils Absolute: 0.2 10*3/uL (ref 0.0–0.7)
Eosinophils Relative: 2.7 % (ref 0.0–5.0)
HCT: 40.8 % (ref 36.0–46.0)
Hemoglobin: 13.6 g/dL (ref 12.0–15.0)
Lymphocytes Relative: 27.2 % (ref 12.0–46.0)
Lymphs Abs: 1.8 10*3/uL (ref 0.7–4.0)
MCHC: 33.3 g/dL (ref 30.0–36.0)
MCV: 80.6 fl (ref 78.0–100.0)
Monocytes Absolute: 0.5 10*3/uL (ref 0.1–1.0)
Monocytes Relative: 8 % (ref 3.0–12.0)
Neutro Abs: 4.1 10*3/uL (ref 1.4–7.7)
Neutrophils Relative %: 61.7 % (ref 43.0–77.0)
Platelets: 177 10*3/uL (ref 150.0–400.0)
RBC: 5.06 Mil/uL (ref 3.87–5.11)
RDW: 17.9 % — ABNORMAL HIGH (ref 11.5–14.6)
WBC: 6.7 10*3/uL (ref 4.5–10.5)

## 2011-10-08 LAB — PROTIME-INR
INR: 1 ratio (ref 0.8–1.0)
Prothrombin Time: 11.2 s (ref 10.2–12.4)

## 2011-10-08 LAB — BASIC METABOLIC PANEL
BUN: 16 mg/dL (ref 6–23)
CO2: 31 mEq/L (ref 19–32)
Calcium: 9.2 mg/dL (ref 8.4–10.5)
Chloride: 102 mEq/L (ref 96–112)
Creatinine, Ser: 1.1 mg/dL (ref 0.4–1.2)
GFR: 54.07 mL/min — ABNORMAL LOW (ref >60.00)
Glucose, Bld: 68 mg/dL — ABNORMAL LOW (ref 70–99)
Potassium: 4 mEq/L (ref 3.5–5.1)
Sodium: 141 mEq/L (ref 135–145)

## 2011-10-08 LAB — APTT: aPTT: 26.1 s (ref 21.7–28.8)

## 2011-10-08 NOTE — Patient Instructions (Signed)
You may stop the Ranexa.  We are going to arrange for a heart catheterization this Friday with Dr. Swaziland.   You are scheduled for a cardiac catheterization on Friday, May 17th at 11:30 am with Dr. Swaziland or associate.  Go to [facility] 2nd Floor Short Stay on Friday at 9:30 am No food or drink after midnight on Thursday. You may take your medications with a sip of water on the day of your procedure.   Stay on your other medicines.   We will see you back in 2 weeks.

## 2011-10-08 NOTE — Progress Notes (Signed)
 Taneal Handrich Date of Birth: 11/14/1940 Medical Record #[id number]  History of Present Illness: Sharia is seen today for a 2 week check. She is seen for Dr. Jordan. She has an extensive history of CAD and PVD. She had remote CABG in 1999 with a NSTEMI back in November of 2012. She has been treated medically. Her cath showed an atretic IMA graft to the LAD, SVG to the PD is patent but is occluded after the continuation of this graft to the PL. She does have left to right collaterals. The mid LAD had a 60 to 70% stenosis. A follow up Myoview was negative in January.   She comes in today. She is here with her husband. We started her on Ranexa last visit. It has not helped. She remains very fatigued and exhausted. Reports more chest pain and has been using more NTG. She will have some radiation down her arm. She continues to try and exercise at rehab. One day she had some tachycardia. We do not have those strips. She failed on beta blocker due to bradycardia. Stopping the beta blocker did not improve her energy level at all. She sees Dr. Brabham on Monday for her extensive PVD. She does have chronic claudication. Overall, she just doesn't feel good and is ready to proceed on with repeat cath.   Current Outpatient Prescriptions on File Prior to Visit  Medication Sig Dispense Refill  . albuterol (PROVENTIL,VENTOLIN) 90 MCG/ACT inhaler Inhale 2 puffs into the lungs every 6 (six) hours as needed for wheezing.  17 g  12  . aspirin 81 MG tablet Take 81 mg by mouth daily.        . budesonide-formoterol (SYMBICORT) 80-4.5 MCG/ACT inhaler Inhale 2 puffs into the lungs 2 (two) times daily.  1 Inhaler  11  . Calcium Carbonate 1500 MG TABS Take 1 tablet by mouth 2 (two) times daily.        . clopidogrel (PLAVIX) 75 MG tablet Take 1 tablet (75 mg total) by mouth daily with breakfast.  30 tablet  6  . esomeprazole (NEXIUM) 40 MG capsule Take 40 mg by mouth daily before breakfast.      . ezetimibe (ZETIA) 10 MG  tablet Take 1 tablet (10 mg total) by mouth daily.  30 tablet  11  . fish oil-omega-3 fatty acids 1000 MG capsule Take 1 g by mouth daily.        . HYDROcodone-acetaminophen (VICODIN) 5-500 MG per tablet Take 1 tablet by mouth 2 (two) times daily. For low back pain       . isosorbide mononitrate (IMDUR) 30 MG 24 hr tablet Take 1 tablet (30 mg total) by mouth daily.  30 tablet  3  . losartan (COZAAR) 100 MG tablet Take 1 tablet (100 mg total) by mouth daily.  30 tablet  11  . nitroGLYCERIN (NITROSTAT) 0.4 MG SL tablet Place 1 tablet (0.4 mg total) under the tongue every 5 (five) minutes as needed for chest pain. Up to 3 doses  25 tablet  11  . rosuvastatin (CRESTOR) 40 MG tablet Take 1 tablet (40 mg total) by mouth daily.  90 tablet  2  . sertraline (ZOLOFT) 100 MG tablet TAKE 1 TABLET DAILY  90 tablet  1    Allergies  Allergen Reactions  . Ciprofloxacin     REACTION: rash IV  . Codeine     REACTION: nausea/vomiting  . Erythromycin     REACTION: tongue burns  . Meperidine Hcl       REACTION: Nausea/vomiting  . Penicillins     REACTION: rash    Past Medical History  Diagnosis Date  . COPD (chronic obstructive pulmonary disease)   . Cerebrovascular disease 01/2009    carotid u/s  R 0-39%   L 60-79%  . CAD (coronary artery disease)     s/CABG (reports IMA and 2 SVGs) back in 1999  Myoview normal 3/10  . Depression   . GERD (gastroesophageal reflux disease)   . Hypertension   . Hyperlipidemia   . Osteoporosis   . PVD (peripheral vascular disease)   . Dizziness   . AAA (abdominal aortic aneurysm) 01/2009    AAA (2.8 x 3.0)  moderate RAS (left); 2.7 x 2.7 cm (07/11/05)  . NSTEMI (non-ST elevated myocardial infarction) 11/12    Cath showed atretic IMA graft to the LAD, SVG to PD was patent but the continuation of this graft to the PL branch was occluded; there were L to R collaterals; Mid LAD had a 60 to 70% stenosis. She has been treated medically.  Neg Myoview 05/2011  . Bradycardia      Metoprolol stopped 08/2011    Past Surgical History  Procedure Date  . Coronary artery bypass graft 1999  . Tubal ligation   . Abdominal aortic aneurysm repair   . Femoral artery stent   . Closed reduction nasal fracture 11/2007  . Carotid endarterectomy 01/02/11    left    History  Smoking status  . Former Smoker -- 0.5 packs/day for 40 years  . Types: Cigarettes  . Quit date: 01/19/1993  Smokeless tobacco  . Never Used  Comment: Quit 12 years ago    History  Alcohol Use  . Yes    rare    Family History  Problem Relation Age of Onset  . Heart attack Mother 54  . Diabetes Mother   . Colon cancer Father 60  . Lung cancer Father   . Heart disease Father     angina  . Lung cancer Sister   . Cancer Sister     lung  . Hypertension Brother   . Heart attack Brother   . Hypothyroidism Sister     autoimmune  . Heart attack Brother     CABG    Review of Systems: The review of systems is per the HPI. All other systems were reviewed and are negative.  Physical Exam: BP 130/80  Pulse 63  Ht 5' 2" (1.575 m)  Wt 149 lb (67.586 kg)  BMI 27.25 kg/m2 Patient is very pleasant and in no acute distress. Skin is warm and dry. Color is normal.  HEENT is unremarkable. Left CEA scar noted. Normocephalic/atraumatic. PERRL. Sclera are nonicteric. Neck is supple. No masses. No JVD. Lungs are clear. Cardiac exam shows a regular rate and rhythm. Abdomen is soft. Extremities are without edema. Decreased pulses. Gait and ROM are intact. No gross neurologic deficits noted.   LABORATORY DATA:  Cardiac Cath Findings (Nov 2012):  Ao Pressure: 124/69 (92)  LV Pressure: 150/7/17  There was no signficant gradient across the aortic valve on pullback.  Left main: Mild plaque.  LAD: Diffuse 40-50% prox to mid with focal 60-70% in midsection  LCX: Gave off two large OMs 30% mid. OM-1 40-50% ostial. OM-2 30%. Large atrial branch which fed L -> R collateral to R PL  RCA: Totally occluded  ostially  LIMA to LAD: Atretic due to good native flow in LAD   SVG sequential graft to PDA and   PL: 30% prox. 40-50% in body. Subtotal occlusion of distal limb to PL (infarct vessel). Native PL small and heavily diseased  LV-gram done in the RAO projection: Ejection fraction = 55% with inferobasilar AK.  Assessment:  1) Native vessel CAD as described above  2) Atretic LIMA to LAD  3) Loss of distal limb of SVG -> PDA -> PL with L-R collaterals (culprit)  4) Ejection fraction = 55% with inferobasilar AK.  Plan/Discussion:  Medical therapy. Distal limb of SVG to R system is very small and not suitable for PCI. Lesion in LAD is borderline but doubt ischemic. If CP persists as outpatient can consider Myoview to evaluate.  Daniel Bensimhon     Assessment / Plan:  

## 2011-10-08 NOTE — Assessment & Plan Note (Signed)
Patient continues to fail with medical therapy. Continues with fatigue, exhaustion and chest pain that is responsive to NTG. We have stopped the Ranexa since it is not working. Her case is reviewed with Dr. [place]. Will proceed on with repeat cath with possible PCI. He may do a flow wire evaluation. Labs are checked today. Her procedure is scheduled for Friday at 11:30. She is willing to proceed. Will tentatively see her back in 2 weeks. She has follow up planned at [facility] on Monday as well. Patient is agreeable to this plan and will call if any problems develop in the interim.

## 2011-10-10 ENCOUNTER — Encounter (HOSPITAL_COMMUNITY): Payer: Self-pay | Admitting: General Practice

## 2011-10-10 ENCOUNTER — Encounter (HOSPITAL_COMMUNITY)
Admission: RE | Disposition: A | Discharge status: Home / Self Care | Payer: Self-pay | Source: Ambulatory Visit | Attending: Cardiology

## 2011-10-10 ENCOUNTER — Ambulatory Visit (HOSPITAL_COMMUNITY)
Admission: RE | Admit: 2011-10-10 | Discharge: 2011-10-11 | Disposition: A | Discharge status: Home / Self Care | Payer: Medicare Other | Source: Ambulatory Visit | Attending: Cardiology | Admitting: Cardiology

## 2011-10-10 ENCOUNTER — Encounter: Payer: Self-pay | Admitting: Surgery

## 2011-10-10 DIAGNOSIS — I209 Angina pectoris, unspecified: Secondary | ICD-10-CM | POA: Insufficient documentation

## 2011-10-10 DIAGNOSIS — I2510 Atherosclerotic heart disease of native coronary artery without angina pectoris: Secondary | ICD-10-CM | POA: Insufficient documentation

## 2011-10-10 DIAGNOSIS — Z01810 Encounter for preprocedural cardiovascular examination: Secondary | ICD-10-CM

## 2011-10-10 DIAGNOSIS — R0789 Other chest pain: Secondary | ICD-10-CM

## 2011-10-10 HISTORY — PX: CORONARY ANGIOPLASTY WITH STENT PLACEMENT: SHX49

## 2011-10-10 HISTORY — DX: Shortness of breath: R06.02

## 2011-10-10 HISTORY — PX: LEFT HEART CATHETERIZATION WITH CORONARY/GRAFT ANGIOGRAM: SHX5450

## 2011-10-10 HISTORY — DX: Cardiac arrhythmia, unspecified: I49.9

## 2011-10-10 HISTORY — DX: Angina pectoris, unspecified: I20.9

## 2011-10-10 SURGERY — LEFT HEART CATHETERIZATION WITH CORONARY/GRAFT ANGIOGRAM
Anesthesia: LOCAL

## 2011-10-10 MED ORDER — ALBUTEROL SULFATE HFA 108 (90 BASE) MCG/ACT IN AERS: 2.0000 | INHALATION_SPRAY | Freq: Four times a day (QID) | RESPIRATORY_TRACT | Status: DC | PRN

## 2011-10-10 MED ORDER — CALCIUM CARBONATE-VITAMIN D 500-200 MG-UNIT PO TABS
1.0000 | ORAL_TABLET | Freq: Every day | ORAL | Status: DC
  Administered 2011-10-11: 1 via ORAL
  Filled 2011-10-10 (×2): qty 1

## 2011-10-10 MED ORDER — ASPIRIN 81 MG PO CHEW
81.0000 mg | CHEWABLE_TABLET | Freq: Every day | ORAL | Status: DC
  Administered 2011-10-11: 81 mg via ORAL
  Filled 2011-10-10: qty 1

## 2011-10-10 MED ORDER — SODIUM CHLORIDE 0.9 % IJ SOLN: 3.0000 mL | Freq: Two times a day (BID) | INTRAMUSCULAR | Status: DC

## 2011-10-10 MED ORDER — NITROGLYCERIN 0.4 MG SL SUBL: 0.4000 mg | SUBLINGUAL_TABLET | SUBLINGUAL | Status: DC | PRN

## 2011-10-10 MED ORDER — ACETAMINOPHEN ER 650 MG PO TBCR: 1300.0000 mg | EXTENDED_RELEASE_TABLET | Freq: Two times a day (BID) | ORAL | Status: DC

## 2011-10-10 MED ORDER — ASPIRIN 81 MG PO CHEW
CHEWABLE_TABLET | ORAL | Status: AC
  Administered 2011-10-10: 324 mg via ORAL
  Filled 2011-10-10: qty 4

## 2011-10-10 MED ORDER — SERTRALINE HCL 100 MG PO TABS
100.0000 mg | ORAL_TABLET | Freq: Every day | ORAL | Status: DC
  Administered 2011-10-11: 100 mg via ORAL
  Filled 2011-10-10 (×2): qty 1

## 2011-10-10 MED ORDER — ISOSORBIDE MONONITRATE ER 30 MG PO TB24
30.0000 mg | ORAL_TABLET | Freq: Every day | ORAL | Status: DC
  Administered 2011-10-11: 30 mg via ORAL
  Filled 2011-10-10 (×2): qty 1

## 2011-10-10 MED ORDER — FENTANYL CITRATE 0.05 MG/ML IJ SOLN
INTRAMUSCULAR | Status: AC
  Filled 2011-10-10: qty 2

## 2011-10-10 MED ORDER — ALBUTEROL 90 MCG/ACT IN AERS: 2.0000 | INHALATION_SPRAY | Freq: Four times a day (QID) | RESPIRATORY_TRACT | Status: DC | PRN

## 2011-10-10 MED ORDER — NITROGLYCERIN 0.2 MG/ML ON CALL CATH LAB
INTRAVENOUS | Status: AC
  Filled 2011-10-10: qty 1

## 2011-10-10 MED ORDER — ONDANSETRON HCL 4 MG/2ML IJ SOLN: 4.0000 mg | Freq: Four times a day (QID) | INTRAMUSCULAR | Status: DC | PRN

## 2011-10-10 MED ORDER — HYDROCODONE-ACETAMINOPHEN 5-325 MG PO TABS
1.0000 | ORAL_TABLET | Freq: Four times a day (QID) | ORAL | Status: DC | PRN
  Administered 2011-10-10 – 2011-10-11 (×3): 1 via ORAL
  Filled 2011-10-10 (×3): qty 1

## 2011-10-10 MED ORDER — SODIUM CHLORIDE 0.9 % IJ SOLN: 3.0000 mL | INTRAMUSCULAR | Status: DC | PRN

## 2011-10-10 MED ORDER — EZETIMIBE 10 MG PO TABS
10.0000 mg | ORAL_TABLET | Freq: Every day | ORAL | Status: DC
  Administered 2011-10-11: 10 mg via ORAL
  Filled 2011-10-10 (×2): qty 1

## 2011-10-10 MED ORDER — CLOPIDOGREL BISULFATE 75 MG PO TABS
75.0000 mg | ORAL_TABLET | Freq: Every day | ORAL | Status: DC
  Administered 2011-10-11: 75 mg via ORAL
  Filled 2011-10-10: qty 1

## 2011-10-10 MED ORDER — CLOPIDOGREL BISULFATE 300 MG PO TABS
ORAL_TABLET | ORAL | Status: AC
  Administered 2011-10-11: 75 mg via ORAL
  Filled 2011-10-10: qty 2

## 2011-10-10 MED ORDER — BIVALIRUDIN 250 MG IV SOLR
INTRAVENOUS | Status: AC
  Filled 2011-10-10: qty 250

## 2011-10-10 MED ORDER — SODIUM CHLORIDE 0.9 % IV SOLN: 1.0000 mL/kg/h | INTRAVENOUS | Status: AC

## 2011-10-10 MED ORDER — CALCIUM CARBONATE-VITAMIN D 250-125 MG-UNIT PO TABS: 1.0000 | ORAL_TABLET | Freq: Every day | ORAL | Status: DC

## 2011-10-10 MED ORDER — MIDAZOLAM HCL 2 MG/2ML IJ SOLN
INTRAMUSCULAR | Status: AC
  Filled 2011-10-10: qty 2

## 2011-10-10 MED ORDER — SODIUM CHLORIDE 0.9 % IV SOLN: 250.0000 mL | INTRAVENOUS | Status: DC | PRN

## 2011-10-10 MED ORDER — DIAZEPAM 5 MG PO TABS
10.0000 mg | ORAL_TABLET | ORAL | Status: AC
  Administered 2011-10-10: 10 mg via ORAL
  Filled 2011-10-10: qty 2

## 2011-10-10 MED ORDER — HEPARIN (PORCINE) IN NACL 2-0.9 UNIT/ML-% IJ SOLN
INTRAMUSCULAR | Status: AC
  Filled 2011-10-10: qty 2000

## 2011-10-10 MED ORDER — LOSARTAN POTASSIUM 50 MG PO TABS
100.0000 mg | ORAL_TABLET | Freq: Every day | ORAL | Status: DC
  Administered 2011-10-11: 100 mg via ORAL
  Filled 2011-10-10 (×2): qty 2

## 2011-10-10 MED ORDER — ACETAMINOPHEN 325 MG PO TABS: 650.0000 mg | ORAL_TABLET | ORAL | Status: DC | PRN

## 2011-10-10 MED ORDER — BUDESONIDE-FORMOTEROL FUMARATE 80-4.5 MCG/ACT IN AERO
2.0000 | INHALATION_SPRAY | Freq: Two times a day (BID) | RESPIRATORY_TRACT | Status: DC
  Administered 2011-10-10 – 2011-10-11 (×2): 2 via RESPIRATORY_TRACT
  Filled 2011-10-10: qty 6.9

## 2011-10-10 MED ORDER — SODIUM CHLORIDE 0.9 % IV SOLN
INTRAVENOUS | Status: DC
  Administered 2011-10-10 (×2): via INTRAVENOUS

## 2011-10-10 MED ORDER — FLUTICASONE PROPIONATE 50 MCG/ACT NA SUSP
2.0000 | Freq: Two times a day (BID) | NASAL | Status: DC | PRN
  Filled 2011-10-10 (×2): qty 16

## 2011-10-10 MED ORDER — ASPIRIN 81 MG PO CHEW
324.0000 mg | CHEWABLE_TABLET | ORAL | Status: AC
Start: 2011-10-11 — End: 2011-10-10
  Administered 2011-10-10: 324 mg via ORAL

## 2011-10-10 MED ORDER — ACETAMINOPHEN 500 MG PO TABS
1000.0000 mg | ORAL_TABLET | Freq: Two times a day (BID) | ORAL | Status: DC
  Administered 2011-10-11: 500 mg via ORAL
  Filled 2011-10-10 (×3): qty 2

## 2011-10-10 MED ORDER — LIDOCAINE HCL (PF) 1 % IJ SOLN
INTRAMUSCULAR | Status: AC
  Filled 2011-10-10: qty 30

## 2011-10-10 NOTE — Interval H&P Note (Signed)
History and Physical Interval Note:  10/10/2011 1:42 PM  Andrea [name]  has presented today for surgery, with the diagnosis of Chest pain  The various methods of treatment have been discussed with the patient and family. After consideration of risks, benefits and other options for treatment, the patient has consented to  Procedure(s) (LRB): LEFT HEART CATHETERIZATION WITH CORONARY/GRAFT ANGIOGRAM (N/A) as a surgical intervention .  The patients' history has been reviewed, patient examined, no change in status, stable for surgery.  I have reviewed the patients' chart and labs.  Questions were answered to the patient's satisfaction.     Theron Arista [facility] 10/10/2011 1:42 PM

## 2011-10-10 NOTE — CV Procedure (Signed)
Cardiac Catheterization Procedure Note  Name: Andrea [name] MRN: [id number] DOB: 01-25-1941  Procedure: Left Heart Cath, Selective Coronary Angiography,saphenous vein graft angiography, LIMA graft angiography, LV angiography,  PTCA/Stent of the posterior lateral branch of the right coronary.  Indication: 71 year old white female with history of coronary disease status post CABG. She presents with refractory anginal symptoms despite aggressive medical therapy.   Diagnostic Procedure Details: The right groin was prepped, draped, and anesthetized with 1% lidocaine. Using the modified Seldinger technique, a 5 French sheath was introduced into the right femoral artery. Standard Judkins catheters were used for selective coronary angiography and left ventriculography. Catheter exchanges were performed over a wire.  The diagnostic procedure was well-tolerated without immediate complications.  PROCEDURAL FINDINGS Hemodynamics: AO 132/61 with a mean of 90 mm of mercury LV 142/12 mmHg  Coronary angiography: Coronary dominance: right  Left mainstem: The left main coronary is calcified with 20% distal stenosis.  Left anterior descending (LAD): The LAD has an 80-90% stenosis in the proximal vessel. The entire proximal vessel is heavily calcified. There is competitive flow to the distal LAD from the IMA graft.  Left circumflex (LCx): The left circumflex gives rise to 2 large marginal branches. The first marginal branch has 40% stenosis proximally. The second marginal branch has 20-30% disease. The left circumflex gives collateral flow to the mid right coronary via an atrial branch.  Right coronary artery (RCA): The right coronary is occluded proximally. There are faint bridging collaterals to the mid right coronary.  The saphenous vein graft to the distal right coronary is patent. There is a 30% decentered stenosis in the proximal vein graft and in the mid main back graft. After the PDA there is a 90%  stenosis in the posterior lateral branch of the right coronary with 2 branches after this.  The LIMA graft to the LAD is widely patent with excellent runoff.  Left ventriculography: Left ventricular systolic function is normal, LVEF is estimated at 55-65%, there is no significant mitral regurgitation   PCI Procedure Note:  Following the diagnostic procedure, the decision was made to proceed with PCI of the posterior lateral branch. The sheath was upsized to a 6 [place]. Weight-based bivalirudin was given for anticoagulation. Once a therapeutic ACT was achieved, a 6 [place] multi-purpose guide catheter was inserted.  A pro-water coronary guidewire was used to cross the lesion.  The lesion was predilated with a 2.0 x 12 mm compliant balloon.  The lesion was then stented with a 2.5 x 18 mm Promus element stent.  The stent was postdilated with a 2.5 mm noncompliant balloon to 18 atmospheres.  Following PCI, there was 0% residual stenosis and TIMI-3 flow. There was however an intimal flap in the distal saphenous vein graft. This was stented with a 3.0 x 12 mm Promus element stent at 12 atmospheres. This yielded an excellent angiographic result with 0% residual stenosis. There was TIMI grade 3 flow. Final angiography confirmed an excellent result. Femoral hemostasis was achieved with manual compression.  The patient tolerated the PCI procedure well. There were no immediate procedural complications.  The patient was transferred to the post catheterization recovery area for further monitoring.  PCI Data: Vessel - posterior lateral branch of the right coronary./Segment - proximal Percent Stenosis (pre)  90% TIMI-flow 3 Stent 2.5 x 16 mm Promus element Percent Stenosis (post) 0% TIMI-flow (post) 3  Final Conclusions:   1. Severe two-vessel obstructive coronary disease. 2. Patent LIMA graft to the LAD area 3. Patent saphenous vein  graft to the distal right coronary. There is severe stenosis in the posterior  lateral branch of the right coronary after the graft touchdown. 4. Normal left ventricular function. 5. Successful stenting of the posterior lateral branch to the right coronary with a drug-eluting stent. Also stenting of the distal saphenous vein graft for intimal disruption.  Recommendations: Continue aspirin and Plavix for one year. Anticipate discharge in the morning.  Andrea [name] 10/10/2011, 3:14 PM

## 2011-10-10 NOTE — H&P (View-Only) (Signed)
[name] Date of Birth: September 22, 1940 Medical Record #[id number]  History of Present Illness: Etienne is seen today for a 2 week check. She is seen for Dr. [place]. She has an extensive history of CAD and PVD. She had remote CABG in 1999 with a NSTEMI back in November of 2012. She has been treated medically. Her cath showed an atretic IMA graft to the LAD, SVG to the PD is patent but is occluded after the continuation of this graft to the PL. She does have left to right collaterals. The mid LAD had a 60 to 70% stenosis. A follow up Myoview was negative in January.   She comes in today. She is here with her husband. We started her on Ranexa last visit. It has not helped. She remains very fatigued and exhausted. Reports more chest pain and has been using more NTG. She will have some radiation down her arm. She continues to try and exercise at rehab. One day she had some tachycardia. We do not have those strips. She failed on beta blocker due to bradycardia. Stopping the beta blocker did not improve her energy level at all. She sees Dr. Myra Gianotti on Monday for her extensive PVD. She does have chronic claudication. Overall, she just doesn't feel good and is ready to proceed on with repeat cath.   Current Outpatient Prescriptions on File Prior to Visit  Medication Sig Dispense Refill  . albuterol (PROVENTIL,VENTOLIN) 90 MCG/ACT inhaler Inhale 2 puffs into the lungs every 6 (six) hours as needed for wheezing.  17 g  12  . aspirin 81 MG tablet Take 81 mg by mouth daily.        . budesonide-formoterol (SYMBICORT) 80-4.5 MCG/ACT inhaler Inhale 2 puffs into the lungs 2 (two) times daily.  1 Inhaler  11  . Calcium Carbonate 1500 MG TABS Take 1 tablet by mouth 2 (two) times daily.        . clopidogrel (PLAVIX) 75 MG tablet Take 1 tablet (75 mg total) by mouth daily with breakfast.  30 tablet  6  . esomeprazole (NEXIUM) 40 MG capsule Take 40 mg by mouth daily before breakfast.      . ezetimibe (ZETIA) 10 MG  tablet Take 1 tablet (10 mg total) by mouth daily.  30 tablet  11  . fish oil-omega-3 fatty acids 1000 MG capsule Take 1 g by mouth daily.        Marland Kitchen HYDROcodone-acetaminophen (VICODIN) 5-500 MG per tablet Take 1 tablet by mouth 2 (two) times daily. For low back pain       . isosorbide mononitrate (IMDUR) 30 MG 24 hr tablet Take 1 tablet (30 mg total) by mouth daily.  30 tablet  3  . losartan (COZAAR) 100 MG tablet Take 1 tablet (100 mg total) by mouth daily.  30 tablet  11  . nitroGLYCERIN (NITROSTAT) 0.4 MG SL tablet Place 1 tablet (0.4 mg total) under the tongue every 5 (five) minutes as needed for chest pain. Up to 3 doses  25 tablet  11  . rosuvastatin (CRESTOR) 40 MG tablet Take 1 tablet (40 mg total) by mouth daily.  90 tablet  2  . sertraline (ZOLOFT) 100 MG tablet TAKE 1 TABLET DAILY  90 tablet  1    Allergies  Allergen Reactions  . Ciprofloxacin     REACTION: rash IV  . Codeine     REACTION: nausea/vomiting  . Erythromycin     REACTION: tongue burns  . Meperidine Hcl  REACTION: Nausea/vomiting  . Penicillins     REACTION: rash    Past Medical History  Diagnosis Date  . COPD (chronic obstructive pulmonary disease)   . Cerebrovascular disease 01/2009    carotid u/s  R 0-39%   L 60-79%  . CAD (coronary artery disease)     s/CABG (reports IMA and 2 SVGs) back in 1999  Myoview normal 3/10  . Depression   . GERD (gastroesophageal reflux disease)   . Hypertension   . Hyperlipidemia   . Osteoporosis   . PVD (peripheral vascular disease)   . Dizziness   . AAA (abdominal aortic aneurysm) 01/2009    AAA (2.8 x 3.0)  moderate RAS (left); 2.7 x 2.7 cm (07/11/05)  . NSTEMI (non-ST elevated myocardial infarction) 11/12    Cath showed atretic IMA graft to the LAD, SVG to PD was patent but the continuation of this graft to the PL branch was occluded; there were L to R collaterals; Mid LAD had a 60 to 70% stenosis. She has been treated medically.  Neg Myoview 05/2011  . Bradycardia      Metoprolol stopped 08/2011    Past Surgical History  Procedure Date  . Coronary artery bypass graft 1999  . Tubal ligation   . Abdominal aortic aneurysm repair   . Femoral artery stent   . Closed reduction nasal fracture 11/2007  . Carotid endarterectomy 01/02/11    left    History  Smoking status  . Former Smoker -- 0.5 packs/day for 40 years  . Types: Cigarettes  . Quit date: 01/19/1993  Smokeless tobacco  . Never Used  Comment: Quit 12 years ago    History  Alcohol Use  . Yes    rare    Family History  Problem Relation Age of Onset  . Heart attack Mother 79  . Diabetes Mother   . Colon cancer Father 9  . Lung cancer Father   . Heart disease Father     angina  . Lung cancer Sister   . Cancer Sister     lung  . Hypertension Brother   . Heart attack Brother   . Hypothyroidism Sister     autoimmune  . Heart attack Brother     CABG    Review of Systems: The review of systems is per the HPI. All other systems were reviewed and are negative.  Physical Exam: BP 130/80  Pulse 63  Ht 5\' 2"  (1.575 m)  Wt 149 lb (67.586 kg)  BMI 27.25 kg/m2 Patient is very pleasant and in no acute distress. Skin is warm and dry. Color is normal.  HEENT is unremarkable. Left CEA scar noted. Normocephalic/atraumatic. PERRL. Sclera are nonicteric. Neck is supple. No masses. No JVD. Lungs are clear. Cardiac exam shows a regular rate and rhythm. Abdomen is soft. Extremities are without edema. Decreased pulses. Gait and ROM are intact. No gross neurologic deficits noted.   LABORATORY DATA:  Cardiac Cath Findings (Nov 2012):  Ao Pressure: 124/69 (92)  LV Pressure: 150/7/17  There was no signficant gradient across the aortic valve on pullback.  Left main: Mild plaque.  LAD: Diffuse 40-50% prox to mid with focal 60-70% in midsection  LCX: Gave off two large OMs 30% mid. OM-1 40-50% ostial. OM-2 30%. Large atrial branch which fed L -> R collateral to R PL  RCA: Totally occluded  ostially  LIMA to LAD: Atretic due to good native flow in LAD   SVG sequential graft to PDA and  PL: 30% prox. 40-50% in body. Subtotal occlusion of distal limb to PL (infarct vessel). Native PL small and heavily diseased  LV-gram done in the RAO projection: Ejection fraction = 55% with inferobasilar AK.  Assessment:  1) Native vessel CAD as described above  2) Atretic LIMA to LAD  3) Loss of distal limb of SVG -> PDA -> PL with L-R collaterals (culprit)  4) Ejection fraction = 55% with inferobasilar AK.  Plan/Discussion:  Medical therapy. Distal limb of SVG to R system is very small and not suitable for PCI. Lesion in LAD is borderline but doubt ischemic. If CP persists as outpatient can consider Myoview to evaluate.  Daniel Bensimhon     Assessment / Plan:

## 2011-10-11 DIAGNOSIS — I2510 Atherosclerotic heart disease of native coronary artery without angina pectoris: Secondary | ICD-10-CM

## 2011-10-11 DIAGNOSIS — R0789 Other chest pain: Secondary | ICD-10-CM

## 2011-10-11 LAB — CBC
HCT: 39.4 % (ref 36.0–46.0)
Hemoglobin: 12.7 g/dL (ref 12.0–15.0)
MCH: 25.9 pg — ABNORMAL LOW (ref 26.0–34.0)
MCHC: 32.2 g/dL (ref 30.0–36.0)

## 2011-10-11 LAB — BASIC METABOLIC PANEL
BUN: 16 mg/dL (ref 6–23)
Calcium: 9.1 mg/dL (ref 8.4–10.5)
GFR calc non Af Amer: 59 mL/min — ABNORMAL LOW (ref >90)
Glucose, Bld: 113 mg/dL — ABNORMAL HIGH (ref 70–99)
Potassium: 3.6 mEq/L (ref 3.5–5.1)

## 2011-10-11 MED ORDER — CLOPIDOGREL BISULFATE 75 MG PO TABS: 75.0000 mg | ORAL_TABLET | Freq: Every day | ORAL | Status: DC

## 2011-10-11 MED ORDER — NITROGLYCERIN 0.4 MG SL SUBL: 0.4000 mg | SUBLINGUAL_TABLET | SUBLINGUAL | Status: DC | PRN

## 2011-10-11 MED ORDER — PANTOPRAZOLE SODIUM 40 MG PO TBEC: 40.0000 mg | DELAYED_RELEASE_TABLET | Freq: Every day | ORAL | Status: DC

## 2011-10-11 MED ORDER — ASPIRIN EC 325 MG PO TBEC: 325.0000 mg | DELAYED_RELEASE_TABLET | Freq: Every day | ORAL | Status: DC

## 2011-10-11 NOTE — Discharge Instructions (Addendum)
We changed your Nexium to Protonix. There is a potential interaction with Nexium and Plavix. With a new stent, it is best to change to Protonix, which is ok with Plavix. Angiogram, Angioplasty, or Stent Placement Care After One of the following procedures was done today. ANGIOGRAM: A catheter was placed through the blood vessel in your groin, contrast was injected into the vessels, and pictures were taken. ANGIOPLASTY: A catheter was placed through the blood vessel in your groin and directed to an area of blocked blood flow. A balloon, and possibly a metal stent were used to open the blockage. If no other blockages are present below this area, your symptoms should improve. If blockages are present below this area, surgery may still be necessary. STENT: A catheter was placed in your groin through which a metal mesh tube was placed in a narrowed part of the artery to facilitate blood flow. You were given intravenous sedation. These medications are rapidly cleared from your bloodstream. You may feel some discomfort at the insertion site after the local anesthetic wears off. This discomfort should gradually improve over the next several days.  Only take over-the-counter or prescription medicines for pain, discomfort, or fever as directed by your caregiver.   Complications are very uncommon after this procedure. Go to the nearest emergency department if you develop any of the following symptoms:   Worsening pain.   Bleeding.   Swelling at the puncture site.   Lightheadedness.   Dizziness or fainting.   Fever or chills.   If oozing, bleeding, or a lump appears at the puncture site, apply firm pressure directly to the site steadily for 15 minutes and go to the emergency department.   Keep the skin around the insertion site dry. You may take showers after 24 hours. If the area does get wet, dry the skin completely. Avoid baths until the skin puncture site heals, usually 5 to 7 days.    Development of redness, increased soreness, or swelling may be signs of a skin infection. Contact your physician.   Rest for the remainder of the day and avoid any heavy lifting (more than 10 pounds or 4.5 kg). Do not operate heavy machinery, drive, or make legal decisions for the first 24 hours after the procedure. Have a responsible person drive you home.   You may resume your usual diet after the procedure. Avoid alcoholic beverages for 24 hours after the procedure.  Document Released: 05/12/2005 Document Revised: 05/01/2011 Document Reviewed: 03/11/2006 [facility] Patient Information 2012 [place], [place].Groin Site Care Refer to this sheet in the next few weeks. These instructions provide you with information on caring for yourself after your procedure. Your caregiver may also give you more specific instructions. Your treatment has been planned according to current medical practices, but problems sometimes occur. Call your caregiver if you have any problems or questions after your procedure. HOME CARE INSTRUCTIONS  You may shower 24 hours after the procedure. Remove the bandage (dressing) and gently wash the site with plain soap and water. Gently pat the site dry.   Do not apply powder or lotion to the site.   Do not sit in a bathtub, swimming pool, or whirlpool for 5 to 7 days.   No bending, squatting, or lifting anything over 10 pounds (4.5 kg) as directed by your caregiver.   Inspect the site at least twice daily.   Do not drive home if you are discharged the same day of the procedure. Have someone else drive you.  You may drive 24 hours after the procedure unless otherwise instructed by your caregiver.  What to expect:  Any bruising will usually fade within 1 to 2 weeks.   Blood that collects in the tissue (hematoma) may be painful to the touch. It should usually decrease in size and tenderness within 1 to 2 weeks.  SEEK IMMEDIATE MEDICAL CARE IF:  You have unusual pain at  the groin site or down the affected leg.   You have redness, warmth, swelling, or pain at the groin site.   You have drainage (other than a small amount of blood on the dressing).   You have chills.   You have a fever or persistent symptoms for more than 72 hours.   You have a fever and your symptoms suddenly get worse.   Your leg becomes pale, cool, tingly, or numb.   You have heavy bleeding from the site. Hold pressure on the site.  Document Released: 06/14/2010 Document Revised: 05/01/2011 Document Reviewed: 06/14/2010 [facility] Patient Information 2012 [place], [place].Nitrofurantoin tablets or capsules What is this medicine? NITROFURANTOIN (nye troe fyoor AN toyn) is an antibiotic. It is used to treat urinary tract infections. This medicine may be used for other purposes; ask your health care provider or pharmacist if you have questions. What should I tell my health care provider before I take this medicine? They need to know if you have any of these conditions: -anemia -diabetes -glucose-6-phosphate dehydrogenase deficiency -kidney disease -liver disease -lung disease -other chronic illness -an unusual or allergic reaction to nitrofurantoin, other antibiotics, other medicines, foods, dyes or preservatives -pregnant or trying to get pregnant -breast-feeding How should I use this medicine? Take this medicine by mouth with a glass of water. Follow the directions on the prescription label. Take this medicine with food or milk. Take your doses at regular intervals. Do not take your medicine more often than directed. Do not stop taking except on your doctor's advice. Talk to your pediatrician regarding the use of this medicine in children. While this drug may be prescribed for selected conditions, precautions do apply. Overdosage: If you think you have taken too much of this medicine contact a poison control center or emergency room at once. NOTE: This medicine is only for you. Do  not share this medicine with others. What if I miss a dose? If you miss a dose, take it as soon as you can. If it is almost time for your next dose, take only that dose. Do not take double or extra doses. What may interact with this medicine? -antacids containing magnesium trisilicate -probenecid -quinolone antibiotics like ciprofloxacin, lomefloxacin, norfloxacin and ofloxacin -sulfinpyrazone This list may not describe all possible interactions. Give your health care provider a list of all the medicines, herbs, non-prescription drugs, or dietary supplements you use. Also tell them if you smoke, drink alcohol, or use illegal drugs. Some items may interact with your medicine. What should I watch for while using this medicine? Tell your doctor or health care professional if your symptoms do not improve or if you get new symptoms. Drink several glasses of water a day. If you are taking this medicine for a long time, visit your doctor for regular checks on your progress. If you are diabetic, you may get a false positive result for sugar in your urine with certain brands of urine tests. Check with your doctor. What side effects may I notice from receiving this medicine? Side effects that you should report to your doctor or health  care professional as soon as possible: -allergic reactions like skin rash or hives, swelling of the face, lips, or tongue -chest pain -cough -difficulty breathing -dizziness, drowsiness -fever or infection -joint aches or pains -pale or blue-tinted skin -redness, blistering, peeling or loosening of the skin, including inside the mouth -tingling, burning, pain, or numbness in hands or feet -unusual bleeding or bruising -unusually weak or tired -yellowing of eyes or skin Side effects that usually do not require medical attention (report to your doctor or health care professional if they continue or are bothersome): -dark urine -diarrhea -headache -loss of  appetite -nausea or vomiting -temporary hair loss This list may not describe all possible side effects. Call your doctor for medical advice about side effects. You may report side effects to FDA at 1-800-FDA-1088. Where should I keep my medicine? Keep out of the reach of children. Store at room temperature between 15 and 30 degrees C (59 and 86 degrees F). Protect from light. Throw away any unused medicine after the expiration date. NOTE: This sheet is a summary. It may not cover all possible information. If you have questions about this medicine, talk to your doctor, pharmacist, or health care provider.  2012, [employer]. (12/01/2007 3:56:47 PM)Aspirin, ASA oral tablets What is this medicine? ASPIRIN (AS pir in) is a pain reliever. It is used to treat mild pain and fever. This medicine is also used as directed by a doctor to prevent and to treat heart attacks, to prevent strokes, and to treat arthritis or inflammation. This medicine may be used for other purposes; ask your health care provider or pharmacist if you have questions. What should I tell my health care provider before I take this medicine? They need to know if you have any of these conditions: -anemia -asthma -bleeding problems -child with chickenpox, the flu, or other viral infection -diabetes -gout -if you frequently drink alcohol containing drinks -kidney disease -liver disease -low level of vitamin K -lupus -smoke tobacco -stomach ulcers or other problems -an unusual or allergic reaction to aspirin, tartrazine dye, other medicines, dyes, or preservatives -pregnant or trying to get pregnant -breast-feeding How should I use this medicine? Take this medicine by mouth with a glass of water. Follow the directions on the package or prescription label. You can take this medicine with or without food. If it upsets your stomach, take it with food. Do not take your medicine more often than directed. Talk to your  pediatrician regarding the use of this medicine in children. While this drug may be prescribed for children as young as 59 years of age for selected conditions, precautions do apply. Children and teenagers should not use this medicine to treat chicken pox or flu symptoms unless directed by a doctor. Patients over 71 years old may have a stronger reaction and need a smaller dose. Overdosage: If you think you have taken too much of this medicine contact a poison control center or emergency room at once. NOTE: This medicine is only for you. Do not share this medicine with others. What if I miss a dose? If you are taking this medicine on a regular schedule and miss a dose, take it as soon as you can. If it is almost time for your next dose, take only that dose. Do not take double or extra doses. What may interact with this medicine? Do not take this medicine with any of the following medications: -cidofovir -ketorolac -probenecid This medicine may also interact with the following medications: -alcohol -alendronate -  bismuth subsalicylate -flavocoxid -herbal supplements like feverfew, garlic, ginger, ginkgo biloba, horse chestnut -medicines for diabetes or glaucoma like acetazolamide, methazolamide -medicines for gout -medicines that treat or prevent blood clots like enoxaparin, heparin, ticlopidine, warfarin -other aspirin and aspirin-like medicines -NSAIDs, medicines for pain and inflammation, like ibuprofen or naproxen -pemetrexed -sulfinpyrazone -varicella live vaccine This list may not describe all possible interactions. Give your health care provider a list of all the medicines, herbs, non-prescription drugs, or dietary supplements you use. Also tell them if you smoke, drink alcohol, or use illegal drugs. Some items may interact with your medicine. What should I watch for while using this medicine? If you are treating yourself for pain, tell your doctor or health care professional if the  pain lasts more than 10 days, if it gets worse, or if there is a new or different kind of pain. Tell your doctor if you see redness or swelling. Also, check with your doctor if you have a fever that lasts for more than 3 days. Only take this medicine to prevent heart attacks or blood clotting if prescribed by your doctor or health care professional. Do not take aspirin or aspirin-like medicines with this medicine. Too much aspirin can be dangerous. Always read the labels carefully. This medicine can irritate your stomach or cause bleeding problems. Do not smoke cigarettes or drink alcohol while taking this medicine. Do not lie down for 30 minutes after taking this medicine to prevent irritation to your throat. If you are scheduled for any medical or dental procedure, tell your healthcare provider that you are taking this medicine. You may need to stop taking this medicine before the procedure. What side effects may I notice from receiving this medicine? Side effects that you should report to your doctor or health care professional as soon as possible: -allergic reactions like skin rash, itching or hives, swelling of the face, lips, or tongue -black, tarry stools -bloody, coffee ground-like vomit -breathing problems -changes in hearing, ringing in the ears -confusion -general ill feeling or flu-like symptoms -pain on swallowing -redness, blistering, peeling or loosening of the skin, including inside the mouth or nose -trouble passing urine or change in the amount of urine -unusual bleeding or bruising -unusually weak or tired -yellowing of the eyes or skin Side effects that usually do not require medical attention (report to your doctor or health care professional if they continue or are bothersome): -diarrhea or constipation -nausea, vomiting -stomach gas, heartburn This list may not describe all possible side effects. Call your doctor for medical advice about side effects. You may report side  effects to FDA at 1-800-FDA-1088. Where should I keep my medicine? Keep out of the reach of children. Store at room temperature between 15 and 30 degrees C (59 and 86 degrees F). Protect from heat and moisture. Do not use this medicine if it has a strong vinegar smell. Throw away any unused medicine after the expiration date. NOTE: This sheet is a summary. It may not cover all possible information. If you have questions about this medicine, talk to your doctor, pharmacist, or health care provider.  2012, [employer]. (08/03/2007 10:44:17 AM)Pantoprazole tablets What is this medicine? PANTOPRAZOLE (pan TOE pra zole) prevents the production of acid in the stomach. It is used to treat gastroesophageal reflux disease (GERD), inflammation of the esophagus, and Zollinger-Ellison syndrome. This medicine may be used for other purposes; ask your health care provider or pharmacist if you have questions. What should I tell my health care  provider before I take this medicine? They need to know if you have any of these conditions: -liver disease -low levels of magnesium in the blood -an unusual or allergic reaction to omeprazole, lansoprazole, pantoprazole, rabeprazole, other medicines, foods, dyes, or preservatives -pregnant or trying to get pregnant -breast-feeding How should I use this medicine? Take this medicine by mouth. Swallow the tablets whole with a drink of water. Follow the directions on the prescription label. Do not crush, break, or chew. Take your medicine at regular intervals. Do not take your medicine more often than directed. Talk to your pediatrician regarding the use of this medicine in children. While this drug may be prescribed for children as young as 5 years for selected conditions, precautions do apply. Overdosage: If you think you have taken too much of this medicine contact a poison control center or emergency room at once. NOTE: This medicine is only for you. Do not  share this medicine with others. What if I miss a dose? If you miss a dose, take it as soon as you can. If it is almost time for your next dose, take only that dose. Do not take double or extra doses. What may interact with this medicine? Do not take this medicine with any of the following medications: -atazanavir -nelfinavir This medicine may also interact with the following medications: -ampicillin -delavirdine -digoxin -diuretics -iron salts -medicines for fungal infections like ketoconazole, itraconazole and voriconazole -warfarin This list may not describe all possible interactions. Give your health care provider a list of all the medicines, herbs, non-prescription drugs, or dietary supplements you use. Also tell them if you smoke, drink alcohol, or use illegal drugs. Some items may interact with your medicine. What should I watch for while using this medicine? It can take several days before your stomach pain gets better. Check with your doctor or health care professional if your condition does not start to get better, or if it gets worse. You may need blood work done while you are taking this medicine. What side effects may I notice from receiving this medicine? Side effects that you should report to your doctor or health care professional as soon as possible: -allergic reactions like skin rash, itching or hives, swelling of the face, lips, or tongue -bone, muscle or joint pain -breathing problems -chest pain or chest tightness -dark yellow or brown urine -dizziness -fast, irregular heartbeat -feeling faint or lightheaded -fever or sore throat -muscle spasm -palpitations -redness, blistering, peeling or loosening of the skin, including inside the mouth -seizures -tremors -unusual bleeding or bruising -unusually weak or tired -yellowing of the eyes or skin Side effects that usually do not require medical attention (Report these to your doctor or health care professional if  they continue or are bothersome.): -constipation -diarrhea -dry mouth -headache -nausea This list may not describe all possible side effects. Call your doctor for medical advice about side effects. You may report side effects to FDA at 1-800-FDA-1088. Where should I keep my medicine? Keep out of the reach of children. Store at room temperature between 15 and 30 degrees C (59 and 86 degrees F). Protect from light and moisture. Throw away any unused medicine after the expiration date. NOTE: This sheet is a summary. It may not cover all possible information. If you have questions about this medicine, talk to your doctor, pharmacist, or health care provider.  2012, [employer] Standard. (07/31/2009 12:03:53 PM)Clopidogrel tablets What is this medicine? CLOPIDOGREL (kloh PID oh grel) helps to prevent blood clots.  This medicine is used to prevent heart attack, stroke, or other vascular events in people who are at high risk. This medicine may be used for other purposes; ask your health care provider or pharmacist if you have questions. What should I tell my health care provider before I take this medicine? They need to know if you have any of the following conditions: -bleeding disorder -bleeding in the brain -planned surgery -stomach or intestinal ulcers -stroke or transient ischemic attack -an unusual or allergic reaction to clopidogrel, other medicines, foods, dyes, or preservatives -pregnant or trying to get pregnant -breast-feeding How should I use this medicine? Take this medicine by mouth with a drink of water. Follow the directions on the prescription label. You may take this medicine with or without food. Take your medicine at regular intervals. Do not take your medicine more often than directed. Talk to your pediatrician regarding the use of this medicine in children. Special care may be needed. Overdosage: If you think you have taken too much of this medicine contact a poison control  center or emergency room at once. NOTE: This medicine is only for you. Do not share this medicine with others. What if I miss a dose? If you miss a dose, take it as soon as you can. If it is almost time for your next dose, take only that dose. Do not take double or extra doses. What may interact with this medicine? -aspirin -blood thinners like cilostazol, enoxaparin, ticlopidine, and warfarin -certain medicines for depression like citalopram, fluoxetine, and fluvoxamine -certain medicines for fungal infections like ketoconazole, fluconazole, and voriconazole -certain medicines for HIV infection like delavirdine, efavirenz, and etravirine -certain medicines for seizures like felbamate, oxcarbazepine, and phenytoin -chloramphenicol -fluvastatin -isoniazid, INH -medicines for inflammation like ibuprofen and naproxen -modafinil -nicardipine -over-the counter supplements like echinacea, feverfew, fish oil, garlic, ginger, ginkgo, green tea, horse chestnut -quinine -stomach acid blockers like cimetidine, omeprazole, and esomeprazole -tamoxifen -tolbutamide -topiramate -torsemide This list may not describe all possible interactions. Give your health care provider a list of all the medicines, herbs, non-prescription drugs, or dietary supplements you use. Also tell them if you smoke, drink alcohol, or use illegal drugs. Some items may interact with your medicine. What should I watch for while using this medicine? Visit your doctor or health care professional for regular check ups. Do not stop taking your medicine unless your doctor tells you to. If you are going to have surgery or dental work, tell your doctor or health care professional that you are taking this medicine. Certain genetic factors may reduce the effect of this medicine. Your doctor may use genetic tests to determine treatment. What side effects may I notice from receiving this medicine? Side effects that you should report to your  doctor or health care professional as soon as possible: -allergic reactions like skin rash, itching or hives, swelling of the face, lips, or tongue -black, tarry stools -blood in urine or vomit -breathing problems -changes in vision -fever -sudden weakness -unusual bleeding or bruising Side effects that usually do not require medical attention (report to your doctor or health care professional if they continue or are bothersome): -constipation or diarrhea -headache -pain in back or joints -stomach upset This list may not describe all possible side effects. Call your doctor for medical advice about side effects. You may report side effects to FDA at 1-800-FDA-1088. Where should I keep my medicine? Keep out of the reach of children. Store at room temperature of 59 to 86 degrees  F (15 to 30 degrees C). Throw away any unused medicine after the expiration date. NOTE: This sheet is a summary. It may not cover all possible information. If you have questions about this medicine, talk to your doctor, pharmacist, or health care provider.  2012, [employer]. (10/31/2008 9:41:49 AM)

## 2011-10-11 NOTE — Progress Notes (Signed)
CARDIAC REHAB PHASE I   PRE:  Rate/Rhythm: 56SB  BP:  Supine: 123/57  Sitting:   Standing:    SaO2: 96%2L,92%RA  MODE:  Ambulation: 680 ft   POST:  Rate/Rhythem: 78SR  BP:  Supine: 123/54  Sitting:   Standing:    SaO2: 89-90%RA,93%RA in bed 0730-0815 Pt walked 680 ft on RA with steady gait. C/o back pain but no CP. To bed after walk. Tolerated well. Education completed. Just completed CRP 2 in [place]. Does not want referral at this time. Going to do Silver sneakers.  Duanne Limerick

## 2011-10-11 NOTE — Progress Notes (Signed)
Progress Note  Name:  Andrea [name]  Date:  10/11/2011 7:32 AM    Subjective:  No chest pain or dyspnea.     Objective:   Filed Vitals:   10/11/11 0500 10/11/11 0600 10/11/11 0647 10/11/11 0700  BP: 118/45 99/38  123/57  Pulse: 54 58  55  Temp:      TempSrc:      Resp: 18     Height:      Weight:   147 lb 7.8 oz (66.9 kg)   SpO2: 94% 97%  96%    Intake/Output Summary (Last 24 hours) at 10/11/11 0732 Last data filed at 10/10/11 2345  Gross per 24 hour  Intake  763.5 ml  Output    725 ml  Net   38.5 ml    Tele:  NSR, Sinus brady  PHYSICAL EXAM General: no acute distress Neck:  No JVD Lungs: Clear to auscultation bilaterally  Heart: normal S1, S2;  Regular rate and rhythm, no murmur Abdomen: soft, nontender Extremities: no edema; right groin without hematoma or bruit  Skin:  No rash; warm and dry Psych:  Normal affect  Labs:  [facility] 10/11/11 0355 10/08/11 1501  NA 140 141  K 3.6 4.0  CL 104 102  CO2 27 31  GLUCOSE 113* 68*  BUN 16 16  CREATININE 0.94 1.1  CALCIUM 9.1 9.2  MG -- --  PHOS -- --    Basename 10/11/11 0355 10/08/11 1501  WBC 5.5 6.7  NEUTROABS -- 4.1  HGB 12.7 13.6  HCT 39.4 40.8  MCV 80.2 80.6  PLT 120* 177.0    Radiology/Studies:  No results found.   Assessment and Plan:  1.  Coronary Artery Disease   -  S/p Promus DES to PL branch of dRCA   -  ASA and Plavix x 12 mos; change ASA to 325 mg qd   -  D/c today after seen by Dr. Hillis Range    -  Keep f/u with Cleaster Corin, NP 5/29  2.  Peripheral Artery Disease   -  Sees [facility] next week for LE claudication  3.  Hypertension   -  Controlled.  Continue current therapy.   4.  Hyperlipidemia   -  Continue Ezetimibe and Crestor  5.  GERD   -  Stop Nexium and start Protonix   Signed, Tereso Newcomer, PA-C  7:32 AM 10/11/2011     I have seen, examined the patient, and reviewed the above assessment and plan.  Doing well this am s/p PCI.  She has been ambulatory without  difficulty.  Exam as above.  She does have a faint R femoral bruit but without tenderness or hematoma.  Will discharge to home today with plan outlined by [name] above.  Co Sign: Hillis Range, MD 10/11/2011 8:12 AM

## 2011-10-11 NOTE — Discharge Summary (Signed)
Discharge Summary   [name] MRN: [id number], DOB/AGE: [birth year] 71 y.o. Admit date: 10/10/2011 D/C date:     10/11/2011    Primary Care Physician:  Letitia Libra, Ala Dach, MD, MD  Primary Cardiologist:  Dr. Peter Sw[place]and  Primary Electrophysiologist:  None   Reason for Admission:  Refractory angina despite aggressive medical therapy  Primary Discharge Diagnoses:   1.  Coronary Artery Disease  - s/p Promus DES to PL branch of dRCA this admission  - Grafts patent by cath this admission  2.  Peripheral Artery Disease   - f/u planned next week with [facility]  Secondary Discharge Diagnoses:   Past Medical History  Diagnosis Date  . COPD (chronic obstructive pulmonary disease)   . Cerebrovascular disease 01/2009    carotid u/s  R 0-39%   L 60-79%  . CAD (coronary artery disease)     s/CABG (reports IMA and 2 SVGs) back in 1999  Myoview normal 3/10  . Depression   . GERD (gastroesophageal reflux disease)   . Hypertension   . Hyperlipidemia   . Osteoporosis   . PVD (peripheral vascular disease)   . Dizziness   . AAA (abdominal aortic aneurysm) 01/2009    AAA (2.8 x 3.0)  moderate RAS (left); 2.7 x 2.7 cm (07/11/05)  . NSTEMI (non-ST elevated myocardial infarction) 11/12    Cath showed atretic IMA graft to the LAD, SVG to PD was patent but the continuation of this graft to the PL branch was occluded; there were L to R collaterals; Mid LAD had a 60 to 70% stenosis. She has been treated medically.  Neg Myoview 05/2011  . Bradycardia     Metoprolol stopped 08/2011  . Angina   . Dysrhythmia     hx of sinus brady  . Shortness of breath   . Blood transfusion     hx of transfusion      Procedures Performed This Admission:    Cardiac Catheterization and Percutaneous Coronary Intervention 10/10/11:  Left mainstem: The left main coronary is calcified with 20% distal stenosis.  Left anterior descending (LAD): The LAD has an 80-90% stenosis in the proximal vessel. The  entire proximal vessel is heavily calcified. There is competitive flow to the distal LAD from the IMA graft.  Left circumflex (LCx): The left circumflex gives rise to 2 large marginal branches. The first marginal branch has 40% stenosis proximally. The second marginal branch has 20-30% disease. The left circumflex gives collateral flow to the mid right coronary via an atrial branch.  Right coronary artery (RCA): The right coronary is occluded proximally. There are faint bridging collaterals to the mid right coronary.  The saphenous vein graft to the distal right coronary is patent. There is a 30% decentered stenosis in the proximal vein graft and in the mid main back graft. After the PDA there is a 90% stenosis in the posterior lateral branch of the right coronary with 2 branches after this.  The LIMA graft to the LAD is widely patent with excellent runoff.  Left ventriculography: Left ventricular systolic function is normal, LVEF is estimated at 55-65%, there is no significant mitral regurgitation   PCI Data:  Vessel - posterior lateral branch of the right coronary./Segment - proximal  Percent Stenosis (pre) 90%  TIMI-flow 3  Stent 2.5 x 16 mm Promus element  Percent Stenosis (post) 0%  TIMI-flow (post) 3   Final Conclusions:  1. Severe two-vessel obstructive coronary disease.  2. Patent LIMA graft to  the LAD area  3. Patent saphenous vein graft to the distal right coronary. There is severe stenosis in the posterior lateral branch of the right coronary after the graft touchdown.  4. Normal left ventricular function.  5. Successful stenting of the posterior lateral branch to the right coronary with a drug-eluting stent. Also stenting of the distal saphenous vein graft for intimal disruption.   Recommendations: Continue aspirin and Plavix for one year. Anticipate discharge in the morning.   Hospital Course: Andrea [name] is a 71 y.o. female with an extensive history of CAD and PVD. She had  remote CABG in 1999 with a NSTEMI back in November of 2012. She has been treated medically. Her cath showed an atretic IMA graft to the LAD, SVG to the PD is patent but is occluded after the continuation of this graft to the PL. She does have left to right collaterals. The mid LAD had a 60 to 70% stenosis. A follow up Myoview was negative in January.  She had been placed on Ranexa recently for continued angina and had her beta blocker d/c'd due to fatigue.  She had no improvement and she was brought in for [facility] and possible PCI.  As noted above, her L-LAD and S-dRCA were patent with severe stenosis in the PL branch of the dRCA after graft touchdown.  She had successful PCI with DES to the PL branch.  On 10/11/2011 she was stable and ready for d/c to home.  She already has follow up planned with Norma Fredrickson, NP.     Discharge Vitals: Blood pressure 123/57, pulse 55, temperature 98 F (36.7 C), temperature source Oral, resp. rate 18, height 5\' 2"  (1.575 m), weight 147 lb 7.8 oz (66.9 kg), SpO2 92.00%.  Labs: Lab Results  Component Value Date   WBC 5.5 10/11/2011   HGB 12.7 10/11/2011   HCT 39.4 10/11/2011   MCV 80.2 10/11/2011   PLT 120* 10/11/2011     Lab 10/11/11 0355  NA 140  K 3.6  CL 104  CO2 27  BUN 16  CREATININE 0.94  CALCIUM 9.1  PROT --  BILITOT --  ALKPHOS --  ALT --  AST --  GLUCOSE 113*   No results found for this basename: CKTOTAL:4,CKMB:4,TROPONINI:4 in the last 72 hours Lab Results  Component Value Date   CHOL 198 09/16/2011   HDL 38* 09/16/2011   LDLCALC 121* 09/16/2011   TRIG 196* 09/16/2011   No results found for this basename: DDIMER   Lab Results  Component Value Date   TSH 3.017 06/02/2011    Diagnostic Studies/Procedures:  No results found.  Discharge Medications   Medication List  As of 10/11/2011  8:10 AM   STOP taking these medications         aspirin 81 MG tablet      esomeprazole 40 MG capsule         TAKE these medications          acetaminophen 650 MG CR tablet   Commonly known as: TYLENOL   Take 1,300 mg by mouth 2 (two) times daily.      albuterol 90 MCG/ACT inhaler   Commonly known as: PROVENTIL,VENTOLIN   Inhale 2 puffs into the lungs every 6 (six) hours as needed. For shortness of breath      aspirin EC 325 MG tablet   Take 1 tablet (325 mg total) by mouth daily.      budesonide-formoterol 80-4.5 MCG/ACT inhaler   Commonly known as:  SYMBICORT   Inhale 2 puffs into the lungs 2 (two) times daily.      CALCIUM + D PO   Take 1 tablet by mouth 2 (two) times daily.      clopidogrel 75 MG tablet   Commonly known as: PLAVIX   Take 75 mg by mouth daily with breakfast.      clopidogrel 75 MG tablet   Commonly known as: PLAVIX   Take 1 tablet (75 mg total) by mouth daily with breakfast.      ezetimibe 10 MG tablet   Commonly known as: ZETIA   Take 10 mg by mouth daily.      fish oil-omega-3 fatty acids 1000 MG capsule   Take 1 g by mouth 2 (two) times daily.      fluticasone 50 MCG/ACT nasal spray   Commonly known as: FLONASE   Place 2 sprays into the nose 2 (two) times daily as needed. For congestion      HYDROcodone-acetaminophen 5-500 MG per tablet   Commonly known as: VICODIN   Take 1 tablet by mouth 2 (two) times daily. For low back pain      isosorbide mononitrate 30 MG 24 hr tablet   Commonly known as: IMDUR   Take 30 mg by mouth daily.      losartan 100 MG tablet   Commonly known as: COZAAR   Take 100 mg by mouth daily.      nitroGLYCERIN 0.4 MG SL tablet   Commonly known as: NITROSTAT   Place 0.4 mg under the tongue every 5 (five) minutes as needed. For chest pain.Up to 3 doses      nitroGLYCERIN 0.4 MG SL tablet   Commonly known as: NITROSTAT   Place 1 tablet (0.4 mg total) under the tongue every 5 (five) minutes as needed for chest pain.      pantoprazole 40 MG tablet   Commonly known as: PROTONIX   Take 1 tablet (40 mg total) by mouth daily.      rosuvastatin 40 MG tablet    Commonly known as: CRESTOR   Take 1 tablet (40 mg total) by mouth daily.      sertraline 100 MG tablet   Commonly known as: ZOLOFT   Take 100 mg by mouth daily.            Disposition   The patient will be discharged in stable condition to home. Discharge Orders    Future Appointments: Provider: Department: Dept Phone: Center:   10/13/2011 2:30 PM [facility] [phone] [facility]   10/13/2011 3:45 PM Nada Libman, MD [facility] [phone] [facility]   10/22/2011 2:30 PM Rosalio Macadamia, NP [facility] [phone] None   12/31/2011 9:45 AM Edwyna Perfect, MD [facility] [phone] [facility]     Future Orders Please Complete By Expires   Diet - low sodium heart healthy      Increase activity slowly      Driving Restrictions      Comments:   None for 3 days   Lifting restrictions      Comments:   None for 3 days   Sexual Activity Restrictions      Comments:   None for 3 days   Discharge wound care:      Comments:   Call [phone] for any swelling, bleeding, bruising or fever.     Follow-up Information    Follow up with Norma Fredrickson, NP on 10/22/2011. (as scheduled)    Contact  information:   [address] [place] [address] [phone]            Duration of Discharge Encounter: Greater than 30 minutes including physician and PA time.  Signed, Tereso Newcomer, PA-C  8:10 AM 10/11/2011        [address] [place], [place]  [address] Phone: [phone] Fax:  [phone]   I have seen, examined the patient, and reviewed the above assessment and plan.   Co Sign: Hillis Range, MD 10/11/2011 4:23 PM

## 2011-10-13 ENCOUNTER — Ambulatory Visit (INDEPENDENT_AMBULATORY_CARE_PROVIDER_SITE_OTHER): Payer: Medicare Other | Admitting: Surgery

## 2011-10-13 ENCOUNTER — Other Ambulatory Visit (INDEPENDENT_AMBULATORY_CARE_PROVIDER_SITE_OTHER): Payer: Medicare Other | Admitting: *Deleted

## 2011-10-13 ENCOUNTER — Encounter: Payer: Self-pay | Admitting: Surgery

## 2011-10-13 VITALS — BP 130/72 | HR 55 | Resp 16 | Ht 62.0 in | Wt 148.4 lb

## 2011-10-13 DIAGNOSIS — I6529 Occlusion and stenosis of unspecified carotid artery: Secondary | ICD-10-CM

## 2011-10-13 DIAGNOSIS — Z48812 Encounter for surgical aftercare following surgery on the circulatory system: Secondary | ICD-10-CM

## 2011-10-13 MED FILL — Dextrose Inj 5%: INTRAVENOUS | Qty: 50 | Status: AC

## 2011-10-13 NOTE — Progress Notes (Signed)
[facility] [place]   Patient name: Andrea [name] MRN: [id number] DOB: [birth year] Sex: female     Chief Complaint  Patient presents with  . Carotid    f/up Left Heart Cath. 10/10/11-    HISTORY OF PRESENT ILLNESS:  the patient is here today for followup. She is status post left carotid endarterectomy with patch angioplasty in August of 2012 for progressive asymptomatic stenosis. She has a history of bilateral iliac stents placed by Dr. Samule Ohm. 3 days ago she underwent coronary angiogram with stenting x2. She has a history of CABG in 1999. She continues to complain of fatigue.  Past Medical History  Diagnosis Date  . COPD (chronic obstructive pulmonary disease)   . Cerebrovascular disease 01/2009    carotid u/s  R 0-39%   L 60-79%  . CAD (coronary artery disease)     s/CABG (reports IMA and 2 SVGs) back in 1999  Myoview normal 3/10  . Depression   . GERD (gastroesophageal reflux disease)   . Hypertension   . Hyperlipidemia   . Osteoporosis   . PVD (peripheral vascular disease)   . Dizziness   . AAA (abdominal aortic aneurysm) 01/2009    AAA (2.8 x 3.0)  moderate RAS (left); 2.7 x 2.7 cm (07/11/05)  . NSTEMI (non-ST elevated myocardial infarction) 11/12    Cath showed atretic IMA graft to the LAD, SVG to PD was patent but the continuation of this graft to the PL branch was occluded; there were L to R collaterals; Mid LAD had a 60 to 70% stenosis. She has been treated medically.  Neg Myoview 05/2011  . Bradycardia     Metoprolol stopped 08/2011  . Angina   . Dysrhythmia     hx of sinus brady  . Shortness of breath   . Blood transfusion     hx of transfusion     Past Surgical History  Procedure Date  . Coronary artery bypass graft 1999  . Tubal ligation   . Abdominal aortic aneurysm repair   . Femoral artery stent   . Closed reduction nasal fracture 11/2007  . Carotid endarterectomy 01/02/11    left  . Coronary angioplasty with stent placement 10/10/2011    drug eluting  to rc & saphenous    History   Social History  . Marital Status: Married    Spouse Name: N/A    Number of Children: 5  . Years of Education: N/A   Occupational History  . Retired     former  Engineer, [employer] (consulting)   Social History Main Topics  . Smoking status: Former Smoker -- 0.5 packs/day for 40 years    Types: Cigarettes    Quit date: 01/19/1993  . Smokeless tobacco: Never Used   Comment: Quit 12 years ago  . Alcohol Use: Yes     rare  . Drug Use: No  . Sexually Active: No   Other Topics Concern  . Not on file   Social History Narrative   Married with 5 children    Family History  Problem Relation Age of Onset  . Heart attack Mother 8  . Diabetes Mother   . Colon cancer Father 73  . Lung cancer Father   . Heart disease Father     angina  . Lung cancer Sister   . Cancer Sister     lung  . Hypertension Brother   . Heart attack Brother   . Hypothyroidism Sister     autoimmune  .  Heart attack Brother     CABG    Allergies as of 10/13/2011 - Review Complete 10/13/2011  Allergen Reaction Noted  . Ciprofloxacin  10/13/2007  . Codeine  10/13/2007  . Erythromycin  10/13/2007  . Meperidine hcl  10/13/2007  . Penicillins  10/13/2007  . Shellfish allergy Nausea And Vomiting 10/10/2011    Current Outpatient Prescriptions on File Prior to Visit  Medication Sig Dispense Refill  . acetaminophen (TYLENOL) 650 MG CR tablet Take 1,300 mg by mouth 2 (two) times daily.      Marland Kitchen albuterol (PROVENTIL,VENTOLIN) 90 MCG/ACT inhaler Inhale 2 puffs into the lungs every 6 (six) hours as needed. For shortness of breath      . aspirin EC 325 MG tablet Take 1 tablet (325 mg total) by mouth daily.  30 tablet  0  . budesonide-formoterol (SYMBICORT) 80-4.5 MCG/ACT inhaler Inhale 2 puffs into the lungs 2 (two) times daily.      . Calcium Carbonate-Vitamin D (CALCIUM + D PO) Take 1 tablet by mouth 2 (two) times daily.      . clopidogrel (PLAVIX) 75 MG tablet Take 75 mg by mouth daily  with breakfast.      . ezetimibe (ZETIA) 10 MG tablet Take 10 mg by mouth daily.      . fish oil-omega-3 fatty acids 1000 MG capsule Take 1 g by mouth 2 (two) times daily.       . fluticasone (FLONASE) 50 MCG/ACT nasal spray Place 2 sprays into the nose 2 (two) times daily as needed. For congestion      . HYDROcodone-acetaminophen (VICODIN) 5-500 MG per tablet Take 1 tablet by mouth 2 (two) times daily. For low back pain       . isosorbide mononitrate (IMDUR) 30 MG 24 hr tablet Take 30 mg by mouth daily.      Marland Kitchen losartan (COZAAR) 100 MG tablet Take 100 mg by mouth daily.      . nitroGLYCERIN (NITROSTAT) 0.4 MG SL tablet Place 0.4 mg under the tongue every 5 (five) minutes as needed. For chest pain.Up to 3 doses      . pantoprazole (PROTONIX) 40 MG tablet Take 1 tablet (40 mg total) by mouth daily.  30 tablet  5  . rosuvastatin (CRESTOR) 40 MG tablet Take 1 tablet (40 mg total) by mouth daily.  90 tablet  2  . sertraline (ZOLOFT) 100 MG tablet Take 100 mg by mouth daily.      . clopidogrel (PLAVIX) 75 MG tablet Take 1 tablet (75 mg total) by mouth daily with breakfast.  30 tablet  11  . nitroGLYCERIN (NITROSTAT) 0.4 MG SL tablet Place 1 tablet (0.4 mg total) under the tongue every 5 (five) minutes as needed for chest pain.  25 tablet  11     REVIEW OF SYSTEMS: Please see history of present illness, otherwise positive for fatigue.  PHYSICAL EXAMINATION:   Vital signs are BP 130/72  Pulse 55  Resp 16  Ht 5\' 2"  (1.575 m)  Wt 148 lb 6.4 oz (67.314 kg)  BMI 27.14 kg/m2  SpO2 89% General: The patient appears their stated age. HEENT:  No gross abnormalities Pulmonary:  Non labored breathing Musculoskeletal: There are no major deformities. Neurologic: No focal weakness or paresthesias are detected, Skin: There are no ulcer or rashes noted. Psychiatric: The patient has normal affect. Cardiovascular: There is a regular rate and rhythm without significant murmur appreciated. Palpable posterior  tibial pulse bilaterally   Diagnostic Studies Carotid duplex  was ordered and reviewed today this shows 40-59% right carotid stenosis. Left carotid endarterectomy site is patent. Velocities are slightly elevated at the distal patch. There was the inability to determine a true stenosis versus vessel change caliber.  Assessment: Status post left carotid endarterectomy Plan: Carotid disease:  I will plan on repeating her ultrasound in 6 months. Hopefully close attention to the distal patch. If the velocities continued to be elevated I will need to consider some other form of diagnostic imaging either angiography versus CT angiography. This was discussed with the patient.  Peripheral vascular disease:  I doubt that the patient's complaints are related to claudication given that she has palpable pulses at her ankle. I told the patient that I would be more than happy to follow her for her lower extremity arterial insufficiency, however currently this is being performed at [facility] which is very appropriate given that Dr. Samule Ohm placed her stents.   Jorge Ny, M.D. [facility] [place] Office: [phone] Pager:  [phone]

## 2011-10-15 NOTE — Progress Notes (Signed)
Addended by: Sharee Pimple on: 10/15/2011 10:17 AM   Modules accepted: Orders

## 2011-10-21 NOTE — Procedures (Unsigned)
CAROTID DUPLEX EXAM  INDICATION:  Carotid disease.  HISTORY: Diabetes:  No. Cardiac:  Yes. Hypertension:  Yes. Smoking:  Previous. Previous Surgery:  Left CEA, 01/02/11. CV History: Amaurosis Fugax No, Paresthesias No, Hemiparesis No.                                      RIGHT             LEFT Brachial systolic pressure:         118               118 Brachial Doppler waveforms:         WNL               WNL Vertebral direction of flow:        Abnormal antegrade                  Antegrade DUPLEX VELOCITIES (cm/sec) CCA peak systolic                   58                87 ECA peak systolic                   127               151 ICA peak systolic                   95                155 ICA end diastolic                   41                64 PLAQUE MORPHOLOGY:                  Heterogenous      Soft PLAQUE AMOUNT:                      Moderate          Mild to moderate PLAQUE LOCATION:                    ICA               End point  IMPRESSION: 1. 40% to 59% right internal carotid artery stenosis. 2. Patent left carotid endarterectomy with elevated velocities at the     distal patch.  Unable to determine true stenosis from vessel     caliber change; however, some amount of soft plaque is present. 3. Right vertebral artery is abnormal antegrade with a normal     subclavian artery. 4. Left vertebral and subclavian arteries are within normal limits.  ___________________________________________ V. Charlena Cross, MD  LT/MEDQ  D:  10/14/2011  T:  10/15/2011  Job:  [id number]

## 2011-10-22 ENCOUNTER — Encounter: Payer: Self-pay | Admitting: Nurse Practitioner

## 2011-10-22 ENCOUNTER — Ambulatory Visit (INDEPENDENT_AMBULATORY_CARE_PROVIDER_SITE_OTHER): Payer: Medicare Other | Admitting: Nurse Practitioner

## 2011-10-22 VITALS — BP 121/79 | HR 65 | Ht 62.0 in | Wt 148.8 lb

## 2011-10-22 DIAGNOSIS — I739 Peripheral vascular disease, unspecified: Secondary | ICD-10-CM

## 2011-10-22 DIAGNOSIS — M2669 Other specified disorders of temporomandibular joint: Secondary | ICD-10-CM

## 2011-10-22 DIAGNOSIS — I25810 Atherosclerosis of coronary artery bypass graft(s) without angina pectoris: Secondary | ICD-10-CM

## 2011-10-22 NOTE — Assessment & Plan Note (Signed)
Patient has had recent PCI. She is improved clinically. We will keep her on her current regimen. Will see her back in 3 months. Will arrange for lower extremity arterial dopplers here and refer if needed to Dr. Excell Seltzer. [facility] She will mostly be limited by her claudication. Patient is agreeable to this plan and will call if any problems develop in the interim.

## 2011-10-22 NOTE — Progress Notes (Signed)
Andrea [name] Date of Birth: [birth year] Medical Record #[id number]  History of Present Illness: Andrea [name] is seen today for a post hospital visit. She is seen for Dr. [place]. She has an extensive history of CAD and PVD. She has failed on medical therapy in regards to her cardiac status and has undergone repeat cath with subsequent PCI with DES to PL branch of distal RCA. Her grafts were patent. She remains on Plavix.  She comes in today. She is doing better. Feeling more energetic. Less chest pain. Using less NTG. [facility] She has seen Dr. Myra Gianotti and he suggested that she get her lower extremity dopplers here since her originial stents were placed by Dr. Samule Ohm. She is better overall.   Current Outpatient Prescriptions on File Prior to Visit  Medication Sig Dispense Refill  . acetaminophen (TYLENOL) 650 MG CR tablet Take 1,300 mg by mouth 2 (two) times daily.      Marland Kitchen albuterol (PROVENTIL,VENTOLIN) 90 MCG/ACT inhaler Inhale 2 puffs into the lungs every 6 (six) hours as needed. For shortness of breath      . budesonide-formoterol (SYMBICORT) 80-4.5 MCG/ACT inhaler Inhale 2 puffs into the lungs 2 (two) times daily.      . Calcium Carbonate-Vitamin D (CALCIUM + D PO) Take 1 tablet by mouth 2 (two) times daily.      . clopidogrel (PLAVIX) 75 MG tablet Take 1 tablet (75 mg total) by mouth daily with breakfast.  30 tablet  11  . ezetimibe (ZETIA) 10 MG tablet Take 10 mg by mouth daily.      . fish oil-omega-3 fatty acids 1000 MG capsule Take 1 g by mouth 2 (two) times daily.       . fluticasone (FLONASE) 50 MCG/ACT nasal spray Place 2 sprays into the nose 2 (two) times daily as needed. For congestion      . HYDROcodone-acetaminophen (VICODIN) 5-500 MG per tablet Take 1 tablet by mouth 2 (two) times daily. For low back pain       . isosorbide mononitrate (IMDUR) 30 MG 24 hr tablet Take 30 mg by mouth daily.      Marland Kitchen losartan (COZAAR) 100 MG tablet Take 100 mg by mouth daily.      .  nitroGLYCERIN (NITROSTAT) 0.4 MG SL tablet Place 1 tablet (0.4 mg total) under the tongue every 5 (five) minutes as needed for chest pain.  25 tablet  11  . pantoprazole (PROTONIX) 40 MG tablet Take 1 tablet (40 mg total) by mouth daily.  30 tablet  5  . rosuvastatin (CRESTOR) 40 MG tablet Take 1 tablet (40 mg total) by mouth daily.  90 tablet  2  . sertraline (ZOLOFT) 100 MG tablet Take 100 mg by mouth daily.      Marland Kitchen DISCONTD: nitroGLYCERIN (NITROSTAT) 0.4 MG SL tablet Place 0.4 mg under the tongue every 5 (five) minutes as needed. For chest pain.Up to 3 doses        Allergies  Allergen Reactions  . Ciprofloxacin     REACTION: rash IV  . Codeine     REACTION: nausea/vomiting  . Erythromycin     REACTION: tongue burns  . Meperidine Hcl     REACTION: Nausea/vomiting  . Penicillins     REACTION: rash  . Shellfish Allergy Nausea And Vomiting    Past Medical History  Diagnosis Date  . COPD (chronic obstructive pulmonary disease)   . Cerebrovascular disease 01/2009    carotid u/s  R  0-39%   L 60-79%  . CAD (coronary artery disease)     s/CABG (reports IMA and 2 SVGs) back in 1999  Myoview normal 3/10; s/p PCI with DES to PL branch of distal RCA 09/2011  . Depression   . GERD (gastroesophageal reflux disease)   . Hypertension   . Hyperlipidemia   . Osteoporosis   . PVD (peripheral vascular disease)   . Dizziness   . AAA (abdominal aortic aneurysm) 01/2009    AAA (2.8 x 3.0)  moderate RAS (left); 2.7 x 2.7 cm (07/11/05)  . NSTEMI (non-ST elevated myocardial infarction) 11/12    Cath showed atretic IMA graft to the LAD, SVG to PD was patent but the continuation of this graft to the PL branch was occluded; there were L to R collaterals; Mid LAD had a 60 to 70% stenosis. She has been treated medically.  Neg Myoview 05/2011  . Bradycardia     Metoprolol stopped 08/2011  . Angina   . Dysrhythmia     hx of sinus brady  . Shortness of breath   . Blood transfusion     hx of transfusion      Past Surgical History  Procedure Date  . Coronary artery bypass graft 1999  . Tubal ligation   . Abdominal aortic aneurysm repair   . Femoral artery stent   . Closed reduction nasal fracture 11/2007  . Carotid endarterectomy 01/02/11    left  . Coronary angioplasty with stent placement 10/10/2011    drug eluting  to rc & saphenous    History  Smoking status  . Former Smoker -- 0.5 packs/day for 40 years  . Types: Cigarettes  . Quit date: 01/19/1993  Smokeless tobacco  . Never Used  Comment: Quit 12 years ago    History  Alcohol Use  . Yes    rare    Family History  Problem Relation Age of Onset  . Heart attack Mother 32  . Diabetes Mother   . Colon cancer Father 48  . Lung cancer Father   . Heart disease Father     angina  . Lung cancer Sister   . Cancer Sister     lung  . Hypertension Brother   . Heart attack Brother   . Hypothyroidism Sister     autoimmune  . Heart attack Brother     CABG    Review of Systems: The review of systems is per the HPI. She remains limited by her legs with her claudication.  All other systems were reviewed and are negative.  Physical Exam: BP 121/79  Pulse 65  Ht 5\' 2"  (1.575 m)  Wt 148 lb 12.8 oz (67.495 kg)  BMI 27.22 kg/m2 Patient is very pleasant and in no acute distress. Skin is warm and dry. Color is normal.  HEENT is unremarkable. Normocephalic/atraumatic. PERRL. Sclera are nonicteric. Neck is supple. No masses. No JVD. Lungs are clear. Cardiac exam shows a regular rate and rhythm. Abdomen is soft. Extremities are without edema. Gait and ROM are intact. No gross neurologic deficits noted.   LABORATORY DATA: N/A   Assessment / Plan:

## 2011-10-22 NOTE — Patient Instructions (Signed)
I think you are doing well.  We will see you back in 3 months.  We will arrange for a doppler on your legs.  Call the [facility] office at [phone] if you have any questions, problems or concerns.

## 2011-11-04 ENCOUNTER — Encounter (INDEPENDENT_AMBULATORY_CARE_PROVIDER_SITE_OTHER): Payer: Medicare Other

## 2011-11-04 DIAGNOSIS — I739 Peripheral vascular disease, unspecified: Secondary | ICD-10-CM

## 2011-12-18 ENCOUNTER — Other Ambulatory Visit: Payer: Self-pay | Admitting: *Deleted

## 2011-12-18 MED ORDER — ROSUVASTATIN CALCIUM 40 MG PO TABS: 40.0000 mg | ORAL_TABLET | Freq: Every day | ORAL | Status: DC

## 2011-12-18 MED ORDER — SERTRALINE HCL 100 MG PO TABS: 100.0000 mg | ORAL_TABLET | Freq: Every day | ORAL | Status: DC

## 2011-12-18 NOTE — Telephone Encounter (Signed)
Ok #[id number] 

## 2011-12-18 NOTE — Telephone Encounter (Signed)
Rx Done/SLS 

## 2011-12-18 NOTE — Telephone Encounter (Signed)
Zoloft request to be sent to [facility] [address] [last refill 11.18.12 #90X1]/SLS Please advise.

## 2011-12-22 LAB — BASIC METABOLIC PANEL
BUN: 17 mg/dL (ref 6–23)
CO2: 29 mEq/L (ref 19–32)
Glucose, Bld: 93 mg/dL (ref 70–99)
Potassium: 4.6 mEq/L (ref 3.5–5.3)

## 2011-12-22 NOTE — Telephone Encounter (Signed)
Pt presented to the lab, future orders released. 

## 2011-12-22 NOTE — Addendum Note (Signed)
Addended by: Mervin Kung A on: 12/22/2011 12:02 PM   Modules accepted: Orders

## 2011-12-23 LAB — HEPATIC FUNCTION PANEL
AST: 17 U/L (ref 0–37)
Albumin: 4.2 g/dL (ref 3.5–5.2)
Total Bilirubin: 0.4 mg/dL (ref 0.3–1.2)
Total Protein: 6.9 g/dL (ref 6.0–8.3)

## 2011-12-23 LAB — LIPID PANEL
Cholesterol: 139 mg/dL (ref 0–200)
Total CHOL/HDL Ratio: 4 Ratio

## 2011-12-27 ENCOUNTER — Other Ambulatory Visit: Payer: Self-pay | Admitting: Cardiology

## 2011-12-29 ENCOUNTER — Telehealth: Payer: Self-pay | Admitting: Cardiology

## 2011-12-29 NOTE — Telephone Encounter (Signed)
Fax Received. Refill Completed. Andrea [name] (R.M.A)   

## 2011-12-31 ENCOUNTER — Ambulatory Visit: Payer: Medicare Other | Admitting: Internal Medicine

## 2012-01-01 ENCOUNTER — Other Ambulatory Visit: Payer: Self-pay | Admitting: Internal Medicine

## 2012-01-05 ENCOUNTER — Ambulatory Visit (INDEPENDENT_AMBULATORY_CARE_PROVIDER_SITE_OTHER): Payer: Medicare Other | Admitting: Internal Medicine

## 2012-01-05 ENCOUNTER — Encounter: Payer: Self-pay | Admitting: Internal Medicine

## 2012-01-05 VITALS — BP 122/80 | HR 62 | Wt 151.0 lb

## 2012-01-05 DIAGNOSIS — M545 Low back pain, unspecified: Secondary | ICD-10-CM

## 2012-01-05 DIAGNOSIS — E785 Hyperlipidemia, unspecified: Secondary | ICD-10-CM

## 2012-01-05 DIAGNOSIS — I10 Essential (primary) hypertension: Secondary | ICD-10-CM

## 2012-01-05 NOTE — Progress Notes (Signed)
Subjective:    [name], female    DOB: September 16, 1940, 71 y.o.   MRN: [id number]  HPI Pt presents to clinic for followup of multiple medical problems. Suffers from chronic back pain with spinal stenosis. Pain worsened with flexion. Recently received epidural injxn without improvement-scheduled for additional in the series. BP reviewed normotensive and weight stable. Tolerating statin tx without myalgias or abn lfts.   Past Medical History  Diagnosis Date  . COPD (chronic obstructive pulmonary disease)   . Cerebrovascular disease 01/2009    carotid u/s  R 0-39%   L 60-79%  . CAD (coronary artery disease)     s/CABG (reports IMA and 2 SVGs) back in 1999  Myoview normal 3/10; s/p PCI with DES to PL branch of distal RCA 09/2011  . Depression   . GERD (gastroesophageal reflux disease)   . Hypertension   . Hyperlipidemia   . Osteoporosis   . PVD (peripheral vascular disease)   . Dizziness   . AAA (abdominal aortic aneurysm) 01/2009    AAA (2.8 x 3.0)  moderate RAS (left); 2.7 x 2.7 cm (07/11/05)  . NSTEMI (non-ST elevated myocardial infarction) 11/12    Cath showed atretic IMA graft to the LAD, SVG to PD was patent but the continuation of this graft to the PL branch was occluded; there were L to R collaterals; Mid LAD had a 60 to 70% stenosis. She has been treated medically.  Neg Myoview 05/2011  . Bradycardia     Metoprolol stopped 08/2011  . Angina   . Dysrhythmia     hx of sinus brady  . Shortness of breath   . Blood transfusion     hx of transfusion    Past Surgical History  Procedure Date  . Coronary artery bypass graft 1999  . Tubal ligation   . Abdominal aortic aneurysm repair   . Femoral artery stent   . Closed reduction nasal fracture 11/2007  . Carotid endarterectomy 01/02/11    left  . Coronary angioplasty with stent placement 10/10/2011    drug eluting  to rc & saphenous    reports that she quit smoking about 18 years ago. Her smoking use included Cigarettes.  She has a 20 pack-year smoking history. She has never used smokeless tobacco. She reports that she drinks alcohol. She reports that she does not use illicit drugs. family history includes Cancer in her sister; Colon cancer (age of onset:60) in her father; Diabetes in her mother; Heart attack in her brothers; Heart attack (age of onset:54) in her mother; Heart disease in her father; Hypertension in her brother; Hypothyroidism in her sister; and Lung cancer in her father and sister. Allergies  Allergen Reactions  . Ciprofloxacin     REACTION: rash IV  . Codeine     REACTION: nausea/vomiting  . Erythromycin     REACTION: tongue burns  . Meperidine Hcl     REACTION: Nausea/vomiting  . Penicillins     REACTION: rash  . Shellfish Allergy Nausea And Vomiting      Review of Systems see hpi     Objective:   Physical Exam  Physical Exam  Nursing note and vitals reviewed. Constitutional: Appears well-developed and well-nourished. No distress.  HENT:  Head: Normocephalic and atraumatic.  Right Ear: External ear normal.  Left Ear: External ear normal.  Eyes: Conjunctivae are normal. No scleral icterus.  Neck: Neck supple. Carotid bruit is not present.  Cardiovascular: Normal rate, regular rhythm and normal heart  sounds.  Exam reveals no gallop and no friction rub.   No murmur heard. Pulmonary/Chest: Effort normal and breath sounds normal. No respiratory distress. He has no wheezes. no rales.  Lymphadenopathy:    He has no cervical adenopathy.  Neurological:Alert.  Skin: Skin is warm and dry. Not diaphoretic.  Psychiatric: Has a normal mood and affect.        Assessment & Plan:

## 2012-01-05 NOTE — Assessment & Plan Note (Signed)
Normotensive and stable. Continue current regimen. Monitor bp as outpt and followup in clinic as scheduled.  

## 2012-01-05 NOTE — Assessment & Plan Note (Signed)
Stable. Continue epidural injxns.

## 2012-01-05 NOTE — Patient Instructions (Addendum)
Please schedule fasting labs prior to next visit Cbc-401.9, chem7-v58.69, a1c-hyperglycemia and lipid/lft-272.4 

## 2012-01-05 NOTE — Assessment & Plan Note (Signed)
LDL now at goal. Continue statin tx.

## 2012-01-13 ENCOUNTER — Telehealth: Payer: Self-pay | Admitting: *Deleted

## 2012-01-13 NOTE — Telephone Encounter (Signed)
Pt reports having "charlies horses" in both legs at night, that has awaken her from sleep; requesting any advice on medication that can be taken to help with this matter/SLS

## 2012-01-13 NOTE — Telephone Encounter (Signed)
I would recommend that she make sure she is staying well hydrated with water and stretching her legs before bed.  If symptoms worsen or no improvement, should be seen in office.

## 2012-01-14 NOTE — Telephone Encounter (Signed)
Called pt no answer LMOM RTC... 01/14/12@9 :00am/LMB

## 2012-01-14 NOTE — Telephone Encounter (Signed)
Patient informed; understood & agreed, declined OV at this time/SLS

## 2012-01-21 ENCOUNTER — Encounter: Payer: Self-pay | Admitting: Cardiology

## 2012-01-21 ENCOUNTER — Ambulatory Visit (INDEPENDENT_AMBULATORY_CARE_PROVIDER_SITE_OTHER): Payer: Medicare Other | Admitting: Cardiology

## 2012-01-21 VITALS — BP 140/90 | HR 61 | Ht 62.0 in | Wt 153.1 lb

## 2012-01-21 DIAGNOSIS — I10 Essential (primary) hypertension: Secondary | ICD-10-CM

## 2012-01-21 DIAGNOSIS — E785 Hyperlipidemia, unspecified: Secondary | ICD-10-CM

## 2012-01-21 DIAGNOSIS — I2510 Atherosclerotic heart disease of native coronary artery without angina pectoris: Secondary | ICD-10-CM

## 2012-01-21 DIAGNOSIS — I739 Peripheral vascular disease, unspecified: Secondary | ICD-10-CM

## 2012-01-21 NOTE — Progress Notes (Signed)
Andrea [name] Date of Birth: 27-Apr-1941 Medical Record #[id number]  History of Present Illness: Andrea [name] is seen today for a followup visit. She has an extensive history of CAD and PVD. She has failed on medical therapy in regards to her cardiac status and has undergone repeat cath with subsequent PCI with DES to PL branch of distal RCA in May of this year.. Her grafts were patent. She remains on Plavix. She reports that she did have an epidural injection 2 weeks ago for back pain and her Plavix was held. On followup today she is still predominantly limited by her back pain. She does complain of some cramps in her calves at night but not with walking. She denies any chest pain. She's had no TIA or CVA symptoms. She had followup fluoroscopy showed a Dopplers in June which look quite good. She is scheduled for carotid Dopplers with Dr. Myra [name].  Current Outpatient Prescriptions on File Prior to Visit  Medication Sig Dispense Refill  . acetaminophen (TYLENOL) 650 MG CR tablet Take 1,300 mg by mouth 2 (two) times daily.      Marland Kitchen albuterol (PROVENTIL,VENTOLIN) 90 MCG/ACT inhaler Inhale 2 puffs into the lungs every 6 (six) hours as needed. For shortness of breath      . aspirin 325 MG EC tablet Take 325 mg by mouth daily.      . budesonide-formoterol (SYMBICORT) 80-4.5 MCG/ACT inhaler Inhale 2 puffs into the lungs 2 (two) times daily.      . Calcium Carbonate-Vitamin D (CALCIUM + D PO) Take 1 tablet by mouth 2 (two) times daily.      . clopidogrel (PLAVIX) 75 MG tablet TAKE 1 TABLET BY MOUTH EVERY DAY WITH BREAKFAST  30 tablet  6  . ezetimibe (ZETIA) 10 MG tablet Take 10 mg by mouth daily.      . fish oil-omega-3 fatty acids 1000 MG capsule Take 1 g by mouth 2 (two) times daily.       . fluticasone (FLONASE) 50 MCG/ACT nasal spray Place 2 sprays into the nose 2 (two) times daily as needed. For congestion      . HYDROcodone-acetaminophen (VICODIN) 5-500 MG per tablet Take 1 tablet by mouth 2 (two) times  daily. For low back pain       . isosorbide mononitrate (IMDUR) 30 MG 24 hr tablet TAKE 1 TABLET (30 MG TOTAL) BY MOUTH DAILY.  30 tablet  3  . losartan (COZAAR) 100 MG tablet Take 100 mg by mouth daily.      . nitroGLYCERIN (NITROSTAT) 0.4 MG SL tablet Place 1 tablet (0.4 mg total) under the tongue every 5 (five) minutes as needed for chest pain.  25 tablet  11  . pantoprazole (PROTONIX) 40 MG tablet Take 1 tablet (40 mg total) by mouth daily.  30 tablet  5  . rosuvastatin (CRESTOR) 40 MG tablet Take 1 tablet (40 mg total) by mouth daily.  90 tablet  1  . sertraline (ZOLOFT) 100 MG tablet Take 1 tablet (100 mg total) by mouth daily.  90 tablet  2  . DISCONTD: isosorbide mononitrate (IMDUR) 30 MG 24 hr tablet Take 30 mg by mouth daily.        Allergies  Allergen Reactions  . Ciprofloxacin     REACTION: rash IV  . Codeine     REACTION: nausea/vomiting  . Erythromycin     REACTION: tongue burns  . Meperidine Hcl     REACTION: Nausea/vomiting  . Penicillins     REACTION:  rash  . Shellfish Allergy Nausea And Vomiting    Past Medical History  Diagnosis Date  . COPD (chronic obstructive pulmonary disease)   . Cerebrovascular disease 01/2009    carotid u/s  R 0-39%   L 60-79%  . CAD (coronary artery disease)     s/CABG (reports IMA and 2 SVGs) back in 1999  Myoview normal 3/10; s/p PCI with DES to PL branch of distal RCA 09/2011  . Depression   . GERD (gastroesophageal reflux disease)   . Hypertension   . Hyperlipidemia   . Osteoporosis   . PVD (peripheral vascular disease)   . Dizziness   . AAA (abdominal aortic aneurysm) 01/2009    AAA (2.8 x 3.0)  moderate RAS (left); 2.7 x 2.7 cm (07/11/05)  . NSTEMI (non-ST elevated myocardial infarction) 11/12    Cath showed atretic IMA graft to the LAD, SVG to PD was patent but the continuation of this graft to the PL branch was occluded; there were L to R collaterals; Mid LAD had a 60 to 70% stenosis. She has been treated medically.  Neg  Myoview 05/2011  . Bradycardia     Metoprolol stopped 08/2011  . Angina   . Dysrhythmia     hx of sinus brady  . Shortness of breath   . Blood transfusion     hx of transfusion   . Herniated lumbar disc without myelopathy     Past Surgical History  Procedure Date  . Coronary artery bypass graft 1999  . Tubal ligation   . Abdominal aortic aneurysm repair   . Femoral artery stent   . Closed reduction nasal fracture 11/2007  . Carotid endarterectomy 01/02/11    left  . Coronary angioplasty with stent placement 10/10/2011    drug eluting  to rc & saphenous    History  Smoking status  . Former Smoker -- 0.5 packs/day for 40 years  . Types: Cigarettes  . Quit date: 01/19/1993  Smokeless tobacco  . Never Used  Comment: Quit 12 years ago    History  Alcohol Use  . Yes    rare    Family History  Problem Relation Age of Onset  . Heart attack Mother 31  . Diabetes Mother   . Colon cancer Father 37  . Lung cancer Father   . Heart disease Father     angina  . Lung cancer Sister   . Cancer Sister     lung  . Hypertension Brother   . Heart attack Brother   . Hypothyroidism Sister     autoimmune  . Heart attack Brother     CABG    Review of Systems: The review of systems is per the HPI.   All other systems were reviewed and are negative.  Physical Exam: BP 140/90  Pulse 61  Ht 5\' 2"  (1.575 m)  Wt 153 lb 1.9 oz (69.455 kg)  BMI 28.01 kg/m2 Patient is very pleasant and in no acute distress. Skin is warm and dry. Color is normal.  HEENT is unremarkable. Normocephalic/atraumatic. PERRL. Sclera are nonicteric. Neck is supple. No masses. No JVD. No bruits. Lungs are clear. Cardiac exam shows a regular rate and rhythm. Abdomen is soft without masses or bruits. Extremities are without edema. Pedal pulses are good. Gait and ROM are intact. No gross neurologic deficits noted.   LABORATORY DATA:    Assessment / Plan: 1. Coronary disease status post coronary bypass surgery  in 1999. Cardiac catheterization earlier this  years showed patent grafts. She underwent stenting of the posterior lateral branch of the distal right coronary via the vein graft with a drug-eluting stent in May of this year. She has no recurrent angina. I strongly recommended that she not stop Plavix for elective procedures at this time. She has a relatively fresh drug-eluting stent and there is a risk of stent thrombosis at this time. I would recommend that she postpone any further elective procedures that would necessitate stopping her Plavix until next May optimally. I will followup again in 6 months.  2. Peripheral arterial disease. She has stable claudication symptoms. Her lower extremity Dopplers looked acceptable in June. She is scheduled for a followup carotid Dopplers with Dr. Myra [name].  3. Hyperlipidemia.  4. Hypertension blood pressure is mildly elevated today. She is going to monitor her blood pressure at home and if it remains elevated we'll need to add additional therapy. For the time being we'll continue with her Cozaar and isosorbide.

## 2012-01-21 NOTE — Patient Instructions (Signed)
Continue your current medication.  Do not stop Plavix unless cleared by me.  I will see you again in 6 months.  Keep a check on your blood pressure at home. If consistently over 140/90 let me know.

## 2012-02-13 ENCOUNTER — Telehealth: Payer: Self-pay | Admitting: Cardiology

## 2012-02-13 MED ORDER — HYDROCHLOROTHIAZIDE 25 MG PO TABS: ORAL_TABLET | ORAL | Status: DC

## 2012-02-13 NOTE — Telephone Encounter (Signed)
Patient called stated for the last 3 days her B/P has been elevated.States she takes her B/P in the mornings before taking her medication.States she has been feeling good no complaints. Will check with Dr.Jordan and call her back.

## 2012-02-13 NOTE — Telephone Encounter (Signed)
9-18 bp 167/101, 9-19 182/94, 9-20 152/93, pls advise at [phone]

## 2012-02-13 NOTE — Telephone Encounter (Signed)
Patient called was told spoke to Dr.Jordan he advised to start HCTZ 25 mg every morning and take cozaar in pm.Advised to continue to monitor B/P and if continues to be elevated call back.

## 2012-03-04 ENCOUNTER — Telehealth: Payer: Self-pay | Admitting: Cardiology

## 2012-03-04 NOTE — Telephone Encounter (Signed)
Walk In pt form " [facility]" paper Dropped Off pt Needs Completed For Cataract Surgery on 04/05/12 Placed in Dr.Jordan's Doc Box ( Cheryl/Jordan Back 10/11)  03/04/12/KM

## 2012-03-05 ENCOUNTER — Telehealth: Payer: Self-pay

## 2012-03-05 NOTE — Telephone Encounter (Signed)
Received surgical clearance fax from [facility].Dr.Jordan advised needs to stay on anti platelet medication,form signed by Dr.Jordan and fax back to [phone].

## 2012-04-01 ENCOUNTER — Other Ambulatory Visit: Payer: Self-pay | Admitting: Physician Assistant

## 2012-04-12 ENCOUNTER — Other Ambulatory Visit: Payer: Self-pay | Admitting: Dermatology

## 2012-04-19 ENCOUNTER — Ambulatory Visit: Payer: Medicare Other | Admitting: Surgery

## 2012-04-19 ENCOUNTER — Other Ambulatory Visit: Payer: Medicare Other

## 2012-04-21 ENCOUNTER — Encounter: Payer: Self-pay | Admitting: Surgery

## 2012-04-26 ENCOUNTER — Other Ambulatory Visit (INDEPENDENT_AMBULATORY_CARE_PROVIDER_SITE_OTHER): Payer: Medicare Other | Admitting: *Deleted

## 2012-04-26 ENCOUNTER — Ambulatory Visit (INDEPENDENT_AMBULATORY_CARE_PROVIDER_SITE_OTHER): Payer: Medicare Other | Admitting: Surgery

## 2012-04-26 ENCOUNTER — Encounter: Payer: Self-pay | Admitting: Surgery

## 2012-04-26 VITALS — BP 144/67 | HR 64 | Ht 62.0 in | Wt 151.7 lb

## 2012-04-26 DIAGNOSIS — Z48812 Encounter for surgical aftercare following surgery on the circulatory system: Secondary | ICD-10-CM

## 2012-04-26 DIAGNOSIS — I6529 Occlusion and stenosis of unspecified carotid artery: Secondary | ICD-10-CM

## 2012-04-26 NOTE — Addendum Note (Signed)
Addended by: Sharee Pimple on: 04/26/2012 02:55 PM   Modules accepted: Orders

## 2012-04-26 NOTE — Progress Notes (Signed)
[facility] [place]   Patient name: Andrea [name] MRN: [id number] DOB: Oct 29, 1940 Sex: female     Chief Complaint  Patient presents with  . Carotid    6 month f/u left CEA 01/02/2011 - pt has no complaints    HISTORY OF PRESENT ILLNESS: The patient is here today for followup. She is status post left carotid endarterectomy with patch angioplasty in August of 2012 for progressive asymptomatic stenosis. She has had no complaints since that time. I have been following her for a velocity elevation of the distal patch. She denies any neurologic symptoms. Specifically she denies numbness or weakness in either extremity. She denies slurred speech. She denies amaurosis fugax. She continues to complain of ongoing back pain. This has been stable for her.   Past Medical History  Diagnosis Date  . COPD (chronic obstructive pulmonary disease)   . Cerebrovascular disease 01/2009    carotid u/s  R 0-39%   L 60-79%  . CAD (coronary artery disease)     s/CABG (reports IMA and 2 SVGs) back in 1999  Myoview normal 3/10; s/p PCI with DES to PL branch of distal RCA 09/2011  . Depression   . GERD (gastroesophageal reflux disease)   . Hypertension   . Hyperlipidemia   . Osteoporosis   . PVD (peripheral vascular disease)   . Dizziness   . AAA (abdominal aortic aneurysm) 01/2009    AAA (2.8 x 3.0)  moderate RAS (left); 2.7 x 2.7 cm (07/11/05)  . NSTEMI (non-ST elevated myocardial infarction) 11/12    Cath showed atretic IMA graft to the LAD, SVG to PD was patent but the continuation of this graft to the PL branch was occluded; there were L to R collaterals; Mid LAD had a 60 to 70% stenosis. She has been treated medically.  Neg Myoview 05/2011  . Bradycardia     Metoprolol stopped 08/2011  . Angina   . Dysrhythmia     hx of sinus brady  . Shortness of breath   . Blood transfusion     hx of transfusion   . Herniated lumbar disc without myelopathy     Past Surgical History  Procedure  Date  . Coronary artery bypass graft 1999  . Tubal ligation   . Abdominal aortic aneurysm repair   . Femoral artery stent   . Closed reduction nasal fracture 11/2007  . Carotid endarterectomy 01/02/11    left  . Coronary angioplasty with stent placement 10/10/2011    drug eluting  to rc & saphenous    History   Social History  . Marital Status: Married    Spouse Name: N/A    Number of Children: 5  . Years of Education: N/A   Occupational History  . Retired     former  Engineer, [employer] (consulting)   Social History Main Topics  . Smoking status: Former Smoker -- 0.5 packs/day for 40 years    Types: Cigarettes    Quit date: 01/19/1993  . Smokeless tobacco: Never Used     Comment: Quit 12 years ago  . Alcohol Use: Yes     Comment: rare  . Drug Use: No  . Sexually Active: No   Other Topics Concern  . Not on file   Social History Narrative   Married with 5 children    Family History  Problem Relation Age of Onset  . Heart attack Mother 42  . Diabetes Mother   . Colon cancer Father 83  . Lung  cancer Father   . Heart disease Father     angina  . Lung cancer Sister   . Cancer Sister     lung  . Hypertension Brother   . Heart attack Brother   . Hypothyroidism Sister     autoimmune  . Heart attack Brother     CABG    Allergies as of 04/26/2012 - Review Complete 04/26/2012  Allergen Reaction Noted  . Ciprofloxacin  10/13/2007  . Codeine  10/13/2007  . Erythromycin  10/13/2007  . Meperidine hcl  10/13/2007  . Penicillins  10/13/2007  . Shellfish allergy Nausea And Vomiting 10/10/2011    Current Outpatient Prescriptions on File Prior to Visit  Medication Sig Dispense Refill  . acetaminophen (TYLENOL) 650 MG CR tablet Take 1,300 mg by mouth 2 (two) times daily.      Marland Kitchen aspirin 325 MG EC tablet Take 325 mg by mouth daily.      . budesonide-formoterol (SYMBICORT) 80-4.5 MCG/ACT inhaler Inhale 2 puffs into the lungs 2 (two) times daily.      . Calcium Carbonate-Vitamin D (CALCIUM + D  PO) Take 1 tablet by mouth 2 (two) times daily.      . clopidogrel (PLAVIX) 75 MG tablet TAKE 1 TABLET BY MOUTH EVERY DAY WITH BREAKFAST  30 tablet  6  . ezetimibe (ZETIA) 10 MG tablet Take 10 mg by mouth daily.      . fish oil-omega-3 fatty acids 1000 MG capsule Take 1 g by mouth 2 (two) times daily.       . fluticasone (FLONASE) 50 MCG/ACT nasal spray Place 2 sprays into the nose 2 (two) times daily as needed. For congestion      . hydrochlorothiazide (HYDRODIURIL) 25 MG tablet Take 25 mg every morning  30 tablet  6  . HYDROcodone-acetaminophen (VICODIN) 5-500 MG per tablet Take 1 tablet by mouth 2 (two) times daily. For low back pain       . isosorbide mononitrate (IMDUR) 30 MG 24 hr tablet TAKE 1 TABLET (30 MG TOTAL) BY MOUTH DAILY.  30 tablet  3  . losartan (COZAAR) 100 MG tablet Take 100 mg by mouth daily.      . nitroGLYCERIN (NITROSTAT) 0.4 MG SL tablet Place 1 tablet (0.4 mg total) under the tongue every 5 (five) minutes as needed for chest pain.  25 tablet  11  . pantoprazole (PROTONIX) 40 MG tablet TAKE 1 TABLET BY MOUTH EVERY DAY  30 tablet  5  . rosuvastatin (CRESTOR) 40 MG tablet Take 1 tablet (40 mg total) by mouth daily.  90 tablet  1  . sertraline (ZOLOFT) 100 MG tablet Take 1 tablet (100 mg total) by mouth daily.  90 tablet  2  . albuterol (PROVENTIL,VENTOLIN) 90 MCG/ACT inhaler Inhale 2 puffs into the lungs every 6 (six) hours as needed. For shortness of breath         REVIEW OF SYSTEMS: No changes since the prior visit, as documented by the patient and the encounter form. PHYSICAL EXAMINATION:   Vital signs are BP 144/67  Pulse 64  Ht 5\' 2"  (1.575 m)  Wt 151 lb 11.2 oz (68.811 kg)  BMI 27.75 kg/m2  SpO2 100% General: The patient appears their stated age. HEENT:  No gross abnormalities Pulmonary:  Non labored breathing Musculoskeletal: There are no major deformities. Neurologic: No focal weakness or paresthesias are detected, Skin: There are no ulcer or rashes  noted. Psychiatric: The patient has normal affect. Cardiovascular: No carotid bruits.  Palpable posterior tibial pulse bilaterally   Diagnostic Studies Carotid ultrasound was ordered and reviewed. This shows 40-59% right carotid stenosis. To left carotid endarterectomy site is patent with evidence of hyperplasia at the distal endarterectomy site. Velocities are suggestive of a 40-59% stenosis.  Assessment: Status post left carotid endarterectomy Plan: I discussed the ultrasound findings stable the patient. We'll continue to keep her on surveillance protocol. Her next scan will be in one year.  Jorge Ny, M.D. [facility] [place] Office: [phone] Pager:  [phone]

## 2012-05-03 ENCOUNTER — Other Ambulatory Visit: Payer: Self-pay

## 2012-05-03 MED ORDER — ISOSORBIDE MONONITRATE ER 30 MG PO TB24: 30.0000 mg | ORAL_TABLET | Freq: Every day | ORAL | Status: DC

## 2012-06-07 ENCOUNTER — Encounter: Payer: Self-pay | Admitting: Internal Medicine

## 2012-06-07 ENCOUNTER — Ambulatory Visit (INDEPENDENT_AMBULATORY_CARE_PROVIDER_SITE_OTHER): Payer: Medicare Other | Admitting: Internal Medicine

## 2012-06-07 VITALS — BP 118/80 | HR 57 | Temp 98.3°F | Resp 16 | Wt 147.8 lb

## 2012-06-07 DIAGNOSIS — M545 Low back pain, unspecified: Secondary | ICD-10-CM

## 2012-06-07 DIAGNOSIS — I10 Essential (primary) hypertension: Secondary | ICD-10-CM

## 2012-06-07 DIAGNOSIS — Z79899 Other long term (current) drug therapy: Secondary | ICD-10-CM

## 2012-06-07 DIAGNOSIS — E785 Hyperlipidemia, unspecified: Secondary | ICD-10-CM

## 2012-06-07 LAB — CBC WITH DIFFERENTIAL/PLATELET
Basophils Absolute: 0.1 10*3/uL (ref 0.0–0.1)
Basophils Relative: 1 % (ref 0–1)
HCT: 42.7 % (ref 36.0–46.0)
Hemoglobin: 14.2 g/dL (ref 12.0–15.0)
Lymphocytes Relative: 25 % (ref 12–46)
MCHC: 33.3 g/dL (ref 30.0–36.0)
Monocytes Absolute: 0.6 10*3/uL (ref 0.1–1.0)
Monocytes Relative: 7 % (ref 3–12)
Neutro Abs: 5.1 10*3/uL (ref 1.7–7.7)
Neutrophils Relative %: 64 % (ref 43–77)
RDW: 15 % (ref 11.5–15.5)
WBC: 7.9 10*3/uL (ref 4.0–10.5)

## 2012-06-07 LAB — LIPID PANEL
Cholesterol: 122 mg/dL (ref 0–200)
LDL Cholesterol: 42 mg/dL (ref 0–99)
Total CHOL/HDL Ratio: 4.1 Ratio
Triglycerides: 250 mg/dL — ABNORMAL HIGH (ref <150)
VLDL: 50 mg/dL — ABNORMAL HIGH (ref 0–40)

## 2012-06-07 LAB — BASIC METABOLIC PANEL
BUN: 19 mg/dL (ref 6–23)
Chloride: 100 mEq/L (ref 96–112)
Potassium: 4.2 mEq/L (ref 3.5–5.3)
Sodium: 141 mEq/L (ref 135–145)

## 2012-06-07 LAB — HEPATIC FUNCTION PANEL
AST: 20 U/L (ref 0–37)
Alkaline Phosphatase: 67 U/L (ref 39–117)
Bilirubin, Direct: 0.1 mg/dL (ref 0.0–0.3)
Total Bilirubin: 0.5 mg/dL (ref 0.3–1.2)

## 2012-06-07 MED ORDER — BUDESONIDE-FORMOTEROL FUMARATE 80-4.5 MCG/ACT IN AERO: 2.0000 | INHALATION_SPRAY | Freq: Two times a day (BID) | RESPIRATORY_TRACT | Status: DC

## 2012-06-07 MED ORDER — HYDROCODONE-ACETAMINOPHEN 5-500 MG PO TABS: 1.0000 | ORAL_TABLET | Freq: Two times a day (BID) | ORAL | Status: DC | PRN

## 2012-06-07 NOTE — Progress Notes (Signed)
Subjective:    [name], female    DOB: 11-15-1940, 72 y.o.   MRN: [id number]  HPI Pt presents to clinic for followup of multiple medical problems. Continues with chronic back pain. Received no improvement from epidural injections. Takes hydrocodone sparingly without evidence of addiction and/or tolerance. BP reviewed as normotensive.   Past Medical History  Diagnosis Date  . COPD (chronic obstructive pulmonary disease)   . Cerebrovascular disease 01/2009    carotid u/s  R 0-39%   L 60-79%  . CAD (coronary artery disease)     s/CABG (reports IMA and 2 SVGs) back in 1999  Myoview normal 3/10; s/p PCI with DES to PL branch of distal RCA 09/2011  . Depression   . GERD (gastroesophageal reflux disease)   . Hypertension   . Hyperlipidemia   . Osteoporosis   . PVD (peripheral vascular disease)   . Dizziness   . AAA (abdominal aortic aneurysm) 01/2009    AAA (2.8 x 3.0)  moderate RAS (left); 2.7 x 2.7 cm (07/11/05)  . NSTEMI (non-ST elevated myocardial infarction) 11/12    Cath showed atretic IMA graft to the LAD, SVG to PD was patent but the continuation of this graft to the PL branch was occluded; there were L to R collaterals; Mid LAD had a 60 to 70% stenosis. She has been treated medically.  Neg Myoview 05/2011  . Bradycardia     Metoprolol stopped 08/2011  . Angina   . Dysrhythmia     hx of sinus brady  . Shortness of breath   . Blood transfusion     hx of transfusion   . Herniated lumbar disc without myelopathy    Past Surgical History  Procedure Date  . Coronary artery bypass graft 1999  . Tubal ligation   . Abdominal aortic aneurysm repair   . Femoral artery stent   . Closed reduction nasal fracture 11/2007  . Carotid endarterectomy 01/02/11    left  . Coronary angioplasty with stent placement 10/10/2011    drug eluting  to rc & saphenous    reports that she quit smoking about 19 years ago. Her smoking use included Cigarettes. She has a 20 pack-year smoking  history. She has never used smokeless tobacco. She reports that she drinks alcohol. She reports that she does not use illicit drugs. family history includes Cancer in her sister; Colon cancer (age of onset:60) in her father; Diabetes in her mother; Heart attack in her brothers; Heart attack (age of onset:54) in her mother; Heart disease in her father; Hypertension in her brother; Hypothyroidism in her sister; and Lung cancer in her father and sister. Allergies  Allergen Reactions  . Ciprofloxacin     REACTION: rash IV  . Codeine     REACTION: nausea/vomiting  . Erythromycin     REACTION: tongue burns  . Meperidine Hcl     REACTION: Nausea/vomiting  . Penicillins     REACTION: rash  . Shellfish Allergy Nausea And Vomiting      Review of Systems see hpi      Objective:   Physical Exam  Physical Exam  Nursing note and vitals reviewed. Constitutional: Appears well-developed and well-nourished. No distress.  HENT:  Head: Normocephalic and atraumatic.  Right Ear: External ear normal.  Left Ear: External ear normal.  Eyes: Conjunctivae are normal. No scleral icterus.  Neck: Neck supple. Carotid bruit is not present.  Cardiovascular: Normal rate, regular rhythm and normal heart sounds.  Exam reveals  no gallop and no friction rub.   No murmur heard. Pulmonary/Chest: Effort normal and breath sounds normal. No respiratory distress. He has no wheezes. no rales.  Lymphadenopathy:    He has no cervical adenopathy.  Neurological:Alert.  Skin: Skin is warm and dry. Not diaphoretic.  Psychiatric: Has a normal mood and affect.        Assessment & Plan:

## 2012-06-07 NOTE — Assessment & Plan Note (Signed)
Obtain lipid/lft. 

## 2012-06-07 NOTE — Assessment & Plan Note (Signed)
Stable. Avoiding surgery if possible. Refill hydrocodone for prn use.

## 2012-06-07 NOTE — Assessment & Plan Note (Signed)
Normotensive and stable. Continue current regimen. Monitor bp as outpt and followup in clinic as scheduled. Obtain cbc and chem7 

## 2012-06-20 ENCOUNTER — Other Ambulatory Visit: Payer: Self-pay | Admitting: Internal Medicine

## 2012-07-10 ENCOUNTER — Other Ambulatory Visit: Payer: Self-pay

## 2012-09-11 ENCOUNTER — Other Ambulatory Visit: Payer: Self-pay | Admitting: Cardiology

## 2012-09-18 ENCOUNTER — Other Ambulatory Visit: Payer: Self-pay | Admitting: Internal Medicine

## 2012-09-20 NOTE — Telephone Encounter (Signed)
Sertraline request [Last Rx 07.25.13 #90x2, last OV 01.13.14 due back in June 2014]; has not rescheduled 5-mth f/p from Dr. Rodena Medin in June; spoke w/patient & explained unknown date of Dr. Rodena Medin return & rescheduled for 06.17.14 @10 :00am w/Dr. Blyth/SLS

## 2012-10-05 ENCOUNTER — Telehealth: Payer: Self-pay

## 2012-10-05 MED ORDER — HYDROCODONE-ACETAMINOPHEN 5-325 MG PO TABS: 1.0000 | ORAL_TABLET | Freq: Two times a day (BID) | ORAL | Status: DC | PRN

## 2012-10-05 NOTE — Telephone Encounter (Signed)
Pharmacy needs the Hydrocodone changed from 5-500 to something else? Please advise and refill? Last RX was wrote on 06-07-12 quantity 30 with 2 refills.  If ok fax to [phone]

## 2012-10-05 NOTE — Telephone Encounter (Signed)
Ok to change to [employer] 5/325 same sig, disp #30, 1 rf

## 2012-10-11 ENCOUNTER — Other Ambulatory Visit: Payer: Self-pay | Admitting: Internal Medicine

## 2012-10-11 ENCOUNTER — Other Ambulatory Visit: Payer: Self-pay | Admitting: Physician Assistant

## 2012-10-11 NOTE — Telephone Encounter (Signed)
Rx sent in to pharmacy. 

## 2012-11-09 ENCOUNTER — Ambulatory Visit (INDEPENDENT_AMBULATORY_CARE_PROVIDER_SITE_OTHER): Payer: Medicare Other | Admitting: Family Medicine

## 2012-11-09 ENCOUNTER — Encounter: Payer: Self-pay | Admitting: Family Medicine

## 2012-11-09 ENCOUNTER — Ambulatory Visit: Payer: Medicare Other | Admitting: Internal Medicine

## 2012-11-09 VITALS — BP 148/82 | HR 63 | Temp 98.1°F | Ht 62.0 in | Wt 155.0 lb

## 2012-11-09 DIAGNOSIS — E785 Hyperlipidemia, unspecified: Secondary | ICD-10-CM

## 2012-11-09 DIAGNOSIS — I2510 Atherosclerotic heart disease of native coronary artery without angina pectoris: Secondary | ICD-10-CM

## 2012-11-09 DIAGNOSIS — I10 Essential (primary) hypertension: Secondary | ICD-10-CM

## 2012-11-09 DIAGNOSIS — T7840XA Allergy, unspecified, initial encounter: Secondary | ICD-10-CM

## 2012-11-09 DIAGNOSIS — R430 Anosmia: Secondary | ICD-10-CM | POA: Insufficient documentation

## 2012-11-09 DIAGNOSIS — J449 Chronic obstructive pulmonary disease, unspecified: Secondary | ICD-10-CM

## 2012-11-09 DIAGNOSIS — J4489 Other specified chronic obstructive pulmonary disease: Secondary | ICD-10-CM

## 2012-11-09 MED ORDER — MONTELUKAST SODIUM 10 MG PO TABS: 10.0000 mg | ORAL_TABLET | Freq: Every evening | ORAL | Status: DC | PRN

## 2012-11-09 NOTE — Patient Instructions (Addendum)
Anosmia Consider switching from fish to krill oil caps, MegaRed daily  Next visit annual with gyn, labs prior to include lipid, renal, cbc, tsh, hepatic call the week before to see if we can do this at  [facility] or we have to use [facility]  Chronic Obstructive Pulmonary Disease Chronic obstructive pulmonary disease (COPD) is a condition in which airflow from the lungs is restricted. The lungs can never return to normal, but there are measures you can take which will improve them and make you feel better. CAUSES   Smoking.  Exposure to secondhand smoke.  Breathing in irritants such as air pollution, dust, cigarette smoke, strong odors, aerosol sprays, or paint fumes.  History of lung infections. SYMPTOMS   Deep, persistent (chronic) cough with a large amount of thick mucus.  Wheezing.  Shortness of breath, especially with physical activity.  Feeling like you cannot get enough air.  Difficulty breathing.  Rapid breaths (tachypnea).  Gray or bluish discoloration (cyanosis) of the skin, especially in fingers, toes, or lips.  Fatigue.  Weight loss.  Swelling in legs, ankles, or feet.  Fast heartbeat (tachycardia).  Frequent lung infections.   Chest tightness. DIAGNOSIS  Initial diagnosis may be based on your history, symptoms, and physical examination. Additional tests for COPD may include:  Chest X-ray.  Computed tomography (CT) scan.  Lung (pulmonary) function tests.  Blood tests. TREATMENT  Treatment focuses on making you comfortable (supportive care). Your caregiver may prescribe medicines (inhaled or pills) to help improve your breathing. Additional treatment options may include oxygen therapy and pulmonary rehabilitation. Treatment should also include reducing your exposure to known irritants and following a plan to stop smoking. HOME CARE INSTRUCTIONS   Take all medicines, including antibiotic medicines, as directed by your caregiver.  Use inhaled medicines  as directed by your caregiver.  Avoid medicines or cough syrups that dry up your airway (antihistamines) and slow down the elimination of secretions. This decreases respiratory capacity and may lead to infections.  If you smoke, stop smoking.  Avoid exposure to smoke, chemicals, and fumes that aggravate your breathing.  Avoid contact with individuals that have a contagious illness.  Avoid extreme temperature and humidity changes.  Use humidifiers at home and at your bedside if they do not make breathing difficult.  Drink enough water and fluids to keep your urine clear or pale yellow. This loosens secretions.  Eat healthy foods. Eating smaller, more frequent meals and resting before meals may help you maintain your strength.  Ask your caregiver about the use of vitamins and mineral supplements.  Stay active. Exercise and physical activity will help maintain your ability to do things you want to do.  Balance activity with periods of rest.  Assume a position of comfort if you become short of breath.  Learn and use relaxation techniques.  Learn and use controlled breathing techniques as directed by your caregiver. Controlled breathing techniques include:  Pursed lip breathing. This breathing technique starts with breathing in (inhaling) through your nose for 1 second. Next, purse your lips as if you were going to whistle. Then breathe out (exhale) through the pursed lips for 2 seconds.  Diaphragmatic breathing. Start by putting one hand on your abdomen just above your waist. Inhale slowly through your nose. The hand on your abdomen should move out. Then exhale slowly through pursed lips. You should be able to feel the hand on your abdomen moving in as you exhale.  Learn and use controlled coughing to clear mucus from your  lungs. Controlled coughing is a series of short, progressive coughs. The steps of controlled coughing are: 1. Lean your head slightly forward. 2. Breathe in deeply  using diaphragmatic breathing. 3. Try to hold your breath for 3 seconds. 4. Keep your mouth slightly open while coughing twice. 5. Spit any mucus out into a tissue. 6. Rest and repeat the steps once or twice as needed.  Receive all protective vaccines your caregiver suggests, especially pneumococcal and influenza vaccines.  Learn to manage stress.  Schedule and attend all follow-up appointments as directed by your caregiver. It is important to keep all your appointments.  Participate in pulmonary rehabilitation as directed by your caregiver.  Use home oxygen as suggested. SEEK MEDICAL CARE IF:   You are coughing up more mucus than usual.  There is a change in the color or thickness of the mucus.  Breathing is more labored than usual.  Your breathing is faster than usual.  Your skin color is more cyanotic than usual.  You are running out of the medicine you take for your breathing.  You are anxious, apprehensive, or restless.  You have a fever. SEEK IMMEDIATE MEDICAL CARE IF:   You have a rapid heart rate.  You have shortness of breath while you are resting.  You have shortness of breath that prevents you from being able to talk.  You have shortness of breath that prevents you from performing your usual physical activities.  You have chest pain lasting longer than 5 minutes.  You have a seizure.  Your family or friends notice that you are agitated or confused. MAKE SURE YOU:   Understand these instructions.  Will watch your condition.  Will get help right away if you are not doing well or get worse. Document Released: 02/19/2005 Document Revised: 02/04/2012 Document Reviewed: 07/12/2010 [facility] Patient Information 2014 [place], [place].

## 2012-11-09 NOTE — Progress Notes (Signed)
[name], female   DOB: 10/28/1940, 72 y.o.   MRN: [id number] Andrea [name] [id number] [birth year] 11/09/2012      Progress Note-Follow Up  Subjective  Chief Complaint  Chief Complaint  Patient presents with  . Follow-up    5 month    HPI  Patient is a 72 year old Caucasian female in today for followup. Generally doing well but is having some trouble with shortness of breath and wheezing intermittently. Has not been using Symbicort routinely. No recent illness. No fevers or chills. No malaise or myalgias. Follows with cardiology routinely. Taking medications as prescribed says is in the Symbicort. No chest pain, palpitations , GI or GU concerns noted today.  Past Medical History  Diagnosis Date  . COPD (chronic obstructive pulmonary disease)   . Cerebrovascular disease 01/2009    carotid u/s  R 0-39%   L 60-79%  . CAD (coronary artery disease)     s/CABG (reports IMA and 2 SVGs) back in 1999  Myoview normal 3/10; s/p PCI with DES to PL branch of distal RCA 09/2011  . Depression   . GERD (gastroesophageal reflux disease)   . Hypertension   . Hyperlipidemia   . Osteoporosis   . PVD (peripheral vascular disease)   . Dizziness   . AAA (abdominal aortic aneurysm) 01/2009    AAA (2.8 x 3.0)  moderate RAS (left); 2.7 x 2.7 cm (07/11/05)  . NSTEMI (non-ST elevated myocardial infarction) 11/12    Cath showed atretic IMA graft to the LAD, SVG to PD was patent but the continuation of this graft to the PL branch was occluded; there were L to R collaterals; Mid LAD had a 60 to 70% stenosis. She has been treated medically.  Neg Myoview 05/2011  . Bradycardia     Metoprolol stopped 08/2011  . Angina   . Dysrhythmia     hx of sinus brady  . Shortness of breath   . Blood transfusion     hx of transfusion   . Herniated lumbar disc without myelopathy     Past Surgical History  Procedure Laterality Date  . Coronary artery bypass graft  1999  . Tubal ligation    . Abdominal  aortic aneurysm repair    . Femoral artery stent    . Closed reduction nasal fracture  11/2007  . Carotid endarterectomy  01/02/11    left  . Coronary angioplasty with stent placement  10/10/2011    drug eluting  to rc & saphenous    Family History  Problem Relation Age of Onset  . Heart attack Mother 74  . Diabetes Mother   . Colon cancer Father 87  . Lung cancer Father   . Heart disease Father     angina  . Lung cancer Sister   . Cancer Sister     lung  . Hypertension Brother   . Heart attack Brother   . Hypothyroidism Sister     autoimmune  . Heart attack Brother     CABG    History   Social History  . Marital Status: Married    Spouse Name: N/A    Number of Children: 5  . Years of Education: N/A   Occupational History  . Retired     former  Engineer, [employer] (consulting)   Social History Main Topics  . Smoking status: Former Smoker -- 0.50 packs/day for 40 years    Types: Cigarettes    Quit date: 01/19/1993  . Smokeless tobacco: Never Used  Comment: Quit 12 years ago  . Alcohol Use: Yes     Comment: rare  . Drug Use: No  . Sexually Active: No   Other Topics Concern  . Not on file   Social History Narrative   Married with 5 children    Current Outpatient Prescriptions on File Prior to Visit  Medication Sig Dispense Refill  . acetaminophen (TYLENOL) 650 MG CR tablet Take 1,300 mg by mouth 2 (two) times daily.      Marland Kitchen albuterol (PROVENTIL,VENTOLIN) 90 MCG/ACT inhaler Inhale 2 puffs into the lungs every 6 (six) hours as needed. For shortness of breath      . aspirin 325 MG EC tablet Take 325 mg by mouth daily.      . budesonide-formoterol (SYMBICORT) 80-4.5 MCG/ACT inhaler Inhale 2 puffs into the lungs 2 (two) times daily.  1 Inhaler  6  . Calcium Carbonate-Vitamin D (CALCIUM + D PO) Take 1 tablet by mouth 2 (two) times daily.      . clopidogrel (PLAVIX) 75 MG tablet TAKE 1 TABLET BY MOUTH EVERY DAY WITH BREAKFAST  30 tablet  6  . CRESTOR 40 MG tablet TAKE 1 TABLET (40 MG  TOTAL) BY MOUTH DAILY.  90 tablet  1  . ezetimibe (ZETIA) 10 MG tablet Take 1 tablet (10 mg total) by mouth daily.  30 tablet  1  . fish oil-omega-3 fatty acids 1000 MG capsule Take 1 g by mouth 2 (two) times daily.       . fluticasone (FLONASE) 50 MCG/ACT nasal spray Place 2 sprays into the nose 2 (two) times daily as needed. For congestion      . hydrochlorothiazide (HYDRODIURIL) 25 MG tablet Take 25 mg every morning  30 tablet  6  . HYDROcodone-acetaminophen (NORCO/VICODIN) 5-325 MG per tablet Take 1 tablet by mouth 2 (two) times daily as needed for pain.  30 tablet  1  . isosorbide mononitrate (IMDUR) 30 MG 24 hr tablet TAKE 1 TABLET (30 MG TOTAL) BY MOUTH DAILY.  30 tablet  3  . losartan (COZAAR) 100 MG tablet Take 1 tablet (100 mg total) by mouth daily.  30 tablet  1  . pantoprazole (PROTONIX) 40 MG tablet TAKE 1 TABLET BY MOUTH EVERY DAY  30 tablet  3  . sertraline (ZOLOFT) 100 MG tablet TAKE 1 TABLET (100 MG TOTAL) BY MOUTH DAILY.  90 tablet  0  . nitroGLYCERIN (NITROSTAT) 0.4 MG SL tablet Place 1 tablet (0.4 mg total) under the tongue every 5 (five) minutes as needed for chest pain.  25 tablet  11   No current facility-administered medications on file prior to visit.    Allergies  Allergen Reactions  . Ciprofloxacin     REACTION: rash IV  . Codeine     REACTION: nausea/vomiting  . Erythromycin     REACTION: tongue burns  . Meperidine Hcl     REACTION: Nausea/vomiting  . Penicillins     REACTION: rash  . Shellfish Allergy Nausea And Vomiting    Review of Systems  Review of Systems  Constitutional: Negative for fever and malaise/fatigue.  HENT: Positive for congestion.        Pnd  Eyes: Negative for discharge.  Respiratory: Positive for shortness of breath and wheezing.   Cardiovascular: Negative for chest pain, palpitations and leg swelling.  Gastrointestinal: Negative for nausea, abdominal pain and diarrhea.  Genitourinary: Negative for dysuria.  Musculoskeletal:  Negative for falls.  Skin: Negative for rash.  Neurological: Negative for  loss of consciousness and headaches.  Endo/Heme/Allergies: Negative for polydipsia.  Psychiatric/Behavioral: Negative for depression and suicidal ideas. The patient is not nervous/anxious and does not have insomnia.     Objective  BP 148/82  Pulse 63  Temp(Src) 98.1 F (36.7 C) (Oral)  Ht 5\' 2"  (1.575 m)  Wt 155 lb (70.308 kg)  BMI 28.34 kg/m2  SpO2 91%  Physical Exam  Physical Exam  Constitutional: She is oriented to person, place, and time and well-developed, well-nourished, and in no distress. No distress.  HENT:  Head: Normocephalic and atraumatic.  Eyes: Conjunctivae are normal.  Neck: Neck supple. No thyromegaly present.  Cardiovascular: Normal rate, regular rhythm and normal heart sounds.   No murmur heard. Pulmonary/Chest: Effort normal and breath sounds normal. She has no wheezes.  Abdominal: She exhibits no distension and no mass.  Musculoskeletal: She exhibits no edema.  Lymphadenopathy:    She has no cervical adenopathy.  Neurological: She is alert and oriented to person, place, and time.  Skin: Skin is warm and dry. No rash noted. She is not diaphoretic.  Psychiatric: Memory, affect and judgment normal.    Lab Results  Component Value Date   TSH 3.017 06/02/2011   Lab Results  Component Value Date   WBC 7.9 06/07/2012   HGB 14.2 06/07/2012   HCT 42.7 06/07/2012   MCV 81.2 06/07/2012   PLT 198 06/07/2012   Lab Results  Component Value Date   CREATININE 0.99 06/07/2012   BUN 19 06/07/2012   NA 141 06/07/2012   K 4.2 06/07/2012   CL 100 06/07/2012   CO2 34* 06/07/2012   Lab Results  Component Value Date   ALT 12 06/07/2012   AST 20 06/07/2012   ALKPHOS 67 06/07/2012   BILITOT 0.5 06/07/2012   Lab Results  Component Value Date   CHOL 122 06/07/2012   Lab Results  Component Value Date   HDL 30* 06/07/2012   Lab Results  Component Value Date   LDLCALC 42 06/07/2012   Lab Results   Component Value Date   TRIG 250* 06/07/2012   Lab Results  Component Value Date   CHOLHDL 4.1 06/07/2012     Assessment & Plan  HYPERTENSION Well controlled no changes to meds.  HYPERLIPIDEMIA Tolerating Crestor, encouraged to add krill oil and Niacin. avoid trans fats.   COPD Increase Symbicort to 2 puffs po bid.   CAD (coronary artery disease) Stable, no recent symptoms.

## 2012-11-10 ENCOUNTER — Encounter: Payer: Self-pay | Admitting: Internal Medicine

## 2012-11-10 NOTE — Assessment & Plan Note (Signed)
Stable, no recent symptoms.  

## 2012-11-10 NOTE — Assessment & Plan Note (Signed)
Well controlled no changes to meds 

## 2012-11-10 NOTE — Assessment & Plan Note (Signed)
Tolerating Crestor, encouraged to add krill oil and Niacin. avoid trans fats.

## 2012-11-10 NOTE — Assessment & Plan Note (Signed)
Increase Symbicort to 2 puffs po bid.

## 2012-11-22 ENCOUNTER — Other Ambulatory Visit: Payer: Self-pay

## 2012-11-22 MED ORDER — PANTOPRAZOLE SODIUM 40 MG PO TBEC: 40.0000 mg | DELAYED_RELEASE_TABLET | Freq: Every day | ORAL | Status: DC

## 2012-11-23 ENCOUNTER — Ambulatory Visit (INDEPENDENT_AMBULATORY_CARE_PROVIDER_SITE_OTHER): Payer: Medicare Other | Admitting: Cardiovascular Disease

## 2012-11-23 ENCOUNTER — Encounter: Payer: Self-pay | Admitting: Cardiovascular Disease

## 2012-11-23 VITALS — BP 128/84 | HR 57 | Ht 62.0 in | Wt 154.8 lb

## 2012-11-23 DIAGNOSIS — I739 Peripheral vascular disease, unspecified: Secondary | ICD-10-CM

## 2012-11-23 DIAGNOSIS — R079 Chest pain, unspecified: Secondary | ICD-10-CM

## 2012-11-23 DIAGNOSIS — E785 Hyperlipidemia, unspecified: Secondary | ICD-10-CM

## 2012-11-23 DIAGNOSIS — I25810 Atherosclerosis of coronary artery bypass graft(s) without angina pectoris: Secondary | ICD-10-CM

## 2012-11-23 DIAGNOSIS — K219 Gastro-esophageal reflux disease without esophagitis: Secondary | ICD-10-CM

## 2012-11-23 MED ORDER — ISOSORBIDE MONONITRATE ER 30 MG PO TB24: 30.0000 mg | ORAL_TABLET | Freq: Two times a day (BID) | ORAL | Status: DC | PRN

## 2012-11-23 MED ORDER — ESOMEPRAZOLE MAGNESIUM 40 MG PO PACK: 40.0000 mg | PACK | Freq: Every day | ORAL | Status: DC

## 2012-11-23 NOTE — Progress Notes (Signed)
[name], female    DOB: 1941-04-26, 72 y.o.   MRN: [id number]  HPI Comments: Andrea [name] is a pleasant 72 year old woman with history of spinal stenosis, peripheral vascular disease, 40-59% bilateral carotid arterial disease in December 2013, history of hyperlipidemia, significantly improved on Crestor, coronary artery disease and bypass surgery in 1999, stent placement to distal RCA may 2013, who presents for routine followup and establish care in the [place] office.   Overall she states that she is doing well. Her back is her biggest complaint. She does have some very mild shortness of breath with exertion but this has been stable. She does report occasional right side chest pain radiating to the left side of her chest coming on at rest. This has only been there over the past several days, not associated with exertion. She denies any heavy lifting or other related symptoms such as lightheadedness, edema. No PND or orthopnea. She has followup in [facility] for her carotid arterial disease. Otherwise she feels well with no complaints.  EKG shows normal sinus rhythm with rate 57 beats per minute with T wave inversions throughout the anterior precordial leads, and inferior leads   Past Medical History   Diagnosis  Date   .  COPD (chronic obstructive pulmonary disease)     .  Cerebrovascular disease  01/2009       carotid u/s  R 0-39%   L 60-79%   .  CAD (coronary artery disease)         s/CABG (reports IMA and 2 SVGs) back in 1999  Myoview normal 3/10; s/p PCI with DES to PL branch of distal RCA 09/2011   .  Depression     .  GERD (gastroesophageal reflux disease)     .  Hypertension     .  Hyperlipidemia     .  Osteoporosis     .  PVD (peripheral vascular disease)     .  Dizziness     .  AAA (abdominal aortic aneurysm)  01/2009       AAA (2.8 x 3.0)  moderate RAS (left); 2.7 x 2.7 cm (07/11/05)   .  NSTEMI (non-ST elevated myocardial infarction)  11/12       Cath showed  atretic IMA graft to the LAD, SVG to PD was patent but the continuation of this graft to the PL branch was occluded; there were L to R collaterals; Mid LAD had a 60 to 70% stenosis. She has been treated medically.  Neg Myoview 05/2011   .  Bradycardia         Metoprolol stopped 08/2011   .  Angina     .  Dysrhythmia         hx of sinus brady   .  Shortness of breath     .  Blood transfusion         hx of transfusion    .  Herniated lumbar disc without myelopathy       Past Surgical History   Procedure  Laterality  Date   .  Coronary artery bypass graft    1999   .  Tubal ligation           .  Femoral artery stent       .  Closed reduction nasal fracture    11/2007   .  Carotid endarterectomy    01/02/11       left   .  Coronary angioplasty with  stent placement    10/10/2011       drug eluting  to rc & saphenous      Outpatient Encounter Prescriptions as of 11/23/2012  Medication Sig Dispense Refill  . acetaminophen (TYLENOL) 650 MG CR tablet Take 1,300 mg by mouth 2 (two) times daily.      Marland Kitchen albuterol (PROVENTIL,VENTOLIN) 90 MCG/ACT inhaler Inhale 2 puffs into the lungs every 6 (six) hours as needed. For shortness of breath      . aspirin 325 MG EC tablet Take 325 mg by mouth daily.      . budesonide-formoterol (SYMBICORT) 80-4.5 MCG/ACT inhaler Inhale 2 puffs into the lungs 2 (two) times daily.  1 Inhaler  6  . Calcium Carbonate-Vitamin D (CALCIUM + D PO) Take 1 tablet by mouth 2 (two) times daily.      . clopidogrel (PLAVIX) 75 MG tablet TAKE 1 TABLET BY MOUTH EVERY DAY WITH BREAKFAST  30 tablet  6  . CRESTOR 40 MG tablet TAKE 1 TABLET (40 MG TOTAL) BY MOUTH DAILY.  90 tablet  1  . ezetimibe (ZETIA) 10 MG tablet Take 1 tablet (10 mg total) by mouth daily.  30 tablet  1  . fish oil-omega-3 fatty acids 1000 MG capsule Take 1 g by mouth 2 (two) times daily.       . fluticasone (FLONASE) 50 MCG/ACT nasal spray Place 2 sprays into the nose 2 (two) times daily as needed. For congestion       . hydrochlorothiazide (HYDRODIURIL) 25 MG tablet Take 25 mg every morning  30 tablet  6  . HYDROcodone-acetaminophen (NORCO/VICODIN) 5-325 MG per tablet Take 1 tablet by mouth 2 (two) times daily as needed for pain.  30 tablet  1  . isosorbide mononitrate (IMDUR) 30 MG 24 hr tablet Take 1 tablet (30 mg total) by mouth 2 (two) times daily as needed.  60 tablet  6  . losartan (COZAAR) 100 MG tablet Take 1 tablet (100 mg total) by mouth daily.  30 tablet  1  . montelukast (SINGULAIR) 10 MG tablet Take 1 tablet (10 mg total) by mouth at bedtime as needed. Allergies, sob  30 tablet  3  . pantoprazole (PROTONIX) 40 MG tablet Take 1 tablet (40 mg total) by mouth daily.  30 tablet  6  . sertraline (ZOLOFT) 100 MG tablet TAKE 1 TABLET (100 MG TOTAL) BY MOUTH DAILY.  90 tablet  0  . esomeprazole (NEXIUM) 40 MG packet Take 40 mg by mouth daily before breakfast.  30 each  12  . nitroGLYCERIN (NITROSTAT) 0.4 MG SL tablet Place 0.4 mg under the tongue every 5 (five) minutes as needed for chest pain.        Review of Systems  Constitutional: Negative.   HENT: Negative.   Eyes: Negative.   Respiratory: Negative.   Cardiovascular: Negative.        Right side chest discomfort at rest  Gastrointestinal: Negative.   Musculoskeletal: Negative.   Skin: Negative.   Neurological: Negative.   Psychiatric/Behavioral: Negative.   All other systems reviewed and are negative.    BP 128/84  Pulse 57  Ht 5\' 2"  (1.575 m)  Wt 154 lb 12 oz (70.194 kg)  BMI 28.3 kg/m2  Physical Exam  Nursing note and vitals reviewed. Constitutional: She is oriented to person, place, and time. She appears well-developed and well-nourished.  HENT:  Head: Normocephalic.  Nose: Nose normal.  Mouth/Throat: Oropharynx is clear and moist.  Eyes: Conjunctivae are normal.  Pupils are equal, round, and reactive to light.  Neck: Normal range of motion. Neck supple. No JVD present.  Cardiovascular: Normal rate, regular rhythm, S1  normal, S2 normal and intact distal pulses.  Exam reveals no gallop and no friction rub.   Murmur heard.  Systolic murmur is present with a grade of 2/6  Pulmonary/Chest: Effort normal and breath sounds normal. No respiratory distress. She has no wheezes. She has no rales. She exhibits no tenderness.  Abdominal: Soft. Bowel sounds are normal. She exhibits no distension. There is no tenderness.  Musculoskeletal: Normal range of motion. She exhibits no edema and no tenderness.  Lymphadenopathy:    She has no cervical adenopathy.  Neurological: She is alert and oriented to person, place, and time. Coordination normal.  Skin: Skin is warm and dry. No rash noted. No erythema.  Psychiatric: She has a normal mood and affect. Her behavior is normal. Judgment and thought content normal.    Assessment and Plan

## 2012-11-23 NOTE — Assessment & Plan Note (Addendum)
Atypical type chest pain on the right on today's visit. We have suggested that she contact us if symptoms persist or get worse. She is otherwise active with no complaints. No further testing at this time.   She continues to have GERD symptoms. We will decrease her aspirin down to 81 mg x2 and continue Plavix

## 2012-11-23 NOTE — Assessment & Plan Note (Signed)
She reports continued GERD symptoms, taking frequent TUMS. Protonix no longer covered by her insurance. We will change her to Nexium 40 mg daily, decrease aspirin down to 81 mg twice a day with Plavix.

## 2012-11-23 NOTE — Assessment & Plan Note (Signed)
Cholesterol is at goal on the current lipid regimen. No changes to the medications were made.  

## 2012-11-23 NOTE — Assessment & Plan Note (Signed)
Moderate bilateral carotid disease, previous stents to the lower extremities. Followed in [facility] by Dr. Myra Gianotti

## 2012-11-23 NOTE — Patient Instructions (Addendum)
You are doing well.  Consider decreasing the aspirin to 81 mg x 2 coated. Continue the plavix one a day Ok to change to nexium   If chest pain gets worse, call the office  Please call us if you have new issues that need to be addressed before your next appt.  Your physician wants you to follow-up in: 6 months.  You will receive a reminder letter in the mail two months in advance. If you don't receive a letter, please call our office to schedule the follow-up appointment.

## 2012-11-27 ENCOUNTER — Other Ambulatory Visit: Payer: Self-pay | Admitting: Cardiovascular Disease

## 2012-11-29 ENCOUNTER — Other Ambulatory Visit: Payer: Self-pay | Admitting: *Deleted

## 2012-11-29 MED ORDER — HYDROCHLOROTHIAZIDE 25 MG PO TABS: ORAL_TABLET | ORAL | Status: DC

## 2012-11-29 NOTE — Telephone Encounter (Signed)
Refilled Hydrochlorothiazide sent to [facility] pharmacy. 

## 2012-12-06 ENCOUNTER — Other Ambulatory Visit: Payer: Self-pay | Admitting: *Deleted

## 2012-12-06 MED ORDER — LOSARTAN POTASSIUM 100 MG PO TABS: 100.0000 mg | ORAL_TABLET | Freq: Every day | ORAL | Status: DC

## 2012-12-06 MED ORDER — EZETIMIBE 10 MG PO TABS: 10.0000 mg | ORAL_TABLET | Freq: Every day | ORAL | Status: DC

## 2012-12-06 NOTE — Telephone Encounter (Signed)
Rx request to pharmacy/SLS  

## 2012-12-13 ENCOUNTER — Other Ambulatory Visit: Payer: Self-pay

## 2012-12-13 MED ORDER — ALBUTEROL SULFATE HFA 108 (90 BASE) MCG/ACT IN AERS: 2.0000 | INHALATION_SPRAY | Freq: Four times a day (QID) | RESPIRATORY_TRACT | Status: DC | PRN

## 2012-12-25 ENCOUNTER — Other Ambulatory Visit: Payer: Self-pay | Admitting: Internal Medicine

## 2012-12-27 NOTE — Telephone Encounter (Signed)
Rx request to pharmacy/SLS  

## 2013-01-05 ENCOUNTER — Ambulatory Visit: Payer: Medicare Other | Admitting: Cardiology

## 2013-02-04 ENCOUNTER — Other Ambulatory Visit: Payer: Self-pay | Admitting: Internal Medicine

## 2013-02-05 ENCOUNTER — Other Ambulatory Visit: Payer: Self-pay | Admitting: Family Medicine

## 2013-02-22 ENCOUNTER — Other Ambulatory Visit: Payer: Self-pay | Admitting: Internal Medicine

## 2013-02-22 ENCOUNTER — Other Ambulatory Visit: Payer: Self-pay | Admitting: *Deleted

## 2013-02-22 MED ORDER — CLOPIDOGREL BISULFATE 75 MG PO TABS: ORAL_TABLET | ORAL | Status: DC

## 2013-02-22 MED ORDER — LOSARTAN POTASSIUM 100 MG PO TABS: ORAL_TABLET | ORAL | Status: DC

## 2013-02-22 NOTE — Telephone Encounter (Signed)
Crestor request Denied; Last Rx 08.02.14, #[id number] Soon for refill request/SLS eScribe request for refill on Sertraline Last filled - 04.28.14, #[id number] Last AEX - 06.17.14 Next AEX - 6 Month Refill sent per [facility] refill protocol/SLS

## 2013-02-22 NOTE — Telephone Encounter (Signed)
Refilled Plavix and losartan sent to [facility] pharmacy.

## 2013-03-17 ENCOUNTER — Encounter: Payer: Self-pay | Admitting: Family Medicine

## 2013-03-17 ENCOUNTER — Telehealth: Payer: Self-pay | Admitting: Family Medicine

## 2013-03-17 ENCOUNTER — Ambulatory Visit (INDEPENDENT_AMBULATORY_CARE_PROVIDER_SITE_OTHER): Payer: Medicare Other | Admitting: Family Medicine

## 2013-03-17 VITALS — BP 156/90 | HR 52 | Temp 98.6°F | Ht 62.0 in | Wt 159.1 lb

## 2013-03-17 DIAGNOSIS — J209 Acute bronchitis, unspecified: Secondary | ICD-10-CM

## 2013-03-17 DIAGNOSIS — R059 Cough, unspecified: Secondary | ICD-10-CM

## 2013-03-17 DIAGNOSIS — R05 Cough: Secondary | ICD-10-CM

## 2013-03-17 DIAGNOSIS — E785 Hyperlipidemia, unspecified: Secondary | ICD-10-CM

## 2013-03-17 DIAGNOSIS — I10 Essential (primary) hypertension: Secondary | ICD-10-CM

## 2013-03-17 DIAGNOSIS — K219 Gastro-esophageal reflux disease without esophagitis: Secondary | ICD-10-CM

## 2013-03-17 DIAGNOSIS — J441 Chronic obstructive pulmonary disease with (acute) exacerbation: Secondary | ICD-10-CM

## 2013-03-17 DIAGNOSIS — M1990 Unspecified osteoarthritis, unspecified site: Secondary | ICD-10-CM

## 2013-03-17 DIAGNOSIS — M129 Arthropathy, unspecified: Secondary | ICD-10-CM

## 2013-03-17 LAB — HEPATIC FUNCTION PANEL
Alkaline Phosphatase: 69 U/L (ref 39–117)
Bilirubin, Direct: 0.1 mg/dL (ref 0.0–0.3)
Indirect Bilirubin: 0.4 mg/dL (ref 0.0–0.9)
Total Bilirubin: 0.5 mg/dL (ref 0.3–1.2)
Total Protein: 6.9 g/dL (ref 6.0–8.3)

## 2013-03-17 LAB — CBC
HCT: 41.5 % (ref 36.0–46.0)
Hemoglobin: 14 g/dL (ref 12.0–15.0)
MCHC: 33.7 g/dL (ref 30.0–36.0)
Platelets: 179 10*3/uL (ref 150–400)
RDW: 16.2 % — ABNORMAL HIGH (ref 11.5–15.5)
WBC: 6.4 10*3/uL (ref 4.0–10.5)

## 2013-03-17 LAB — LIPID PANEL
HDL: 34 mg/dL — ABNORMAL LOW (ref >39)
LDL Cholesterol: 199 mg/dL — ABNORMAL HIGH (ref 0–99)
Total CHOL/HDL Ratio: 9 Ratio
Triglycerides: 364 mg/dL — ABNORMAL HIGH (ref <150)
VLDL: 73 mg/dL — ABNORMAL HIGH (ref 0–40)

## 2013-03-17 LAB — RENAL FUNCTION PANEL
BUN: 15 mg/dL (ref 6–23)
CO2: 31 mEq/L (ref 19–32)
Chloride: 103 mEq/L (ref 96–112)
Creat: 0.92 mg/dL (ref 0.50–1.10)
Glucose, Bld: 89 mg/dL (ref 70–99)
Phosphorus: 3.5 mg/dL (ref 2.3–4.6)
Potassium: 4.6 mEq/L (ref 3.5–5.3)
Sodium: 141 mEq/L (ref 135–145)

## 2013-03-17 MED ORDER — BUDESONIDE-FORMOTEROL FUMARATE 160-4.5 MCG/ACT IN AERO: 2.0000 | INHALATION_SPRAY | Freq: Two times a day (BID) | RESPIRATORY_TRACT | Status: DC

## 2013-03-17 MED ORDER — DOXYCYCLINE HYCLATE 100 MG PO TABS: 100.0000 mg | ORAL_TABLET | Freq: Two times a day (BID) | ORAL | Status: DC

## 2013-03-17 MED ORDER — METHYLPREDNISOLONE (PAK) 4 MG PO TABS: ORAL_TABLET | ORAL | Status: DC

## 2013-03-17 MED ORDER — PANTOPRAZOLE SODIUM 40 MG PO TBEC: 40.0000 mg | DELAYED_RELEASE_TABLET | Freq: Every day | ORAL | Status: DC

## 2013-03-17 MED ORDER — DICLOFENAC SODIUM 1 % TD GEL: 4.0000 g | Freq: Four times a day (QID) | TRANSDERMAL | Status: DC

## 2013-03-17 NOTE — Telephone Encounter (Signed)
Lab order week of 06-07-2013 Next visit annual, lipids, renal, cbc, hepatic, tsh labs prior

## 2013-03-17 NOTE — Patient Instructions (Signed)
Increase hydration, add Mucinex 600 mg twice a day x 10 days and a probiotic daily such as Digestive Advantage   Chronic Obstructive Pulmonary Disease Chronic obstructive pulmonary disease (COPD) is a condition in which airflow from the lungs is restricted. The lungs can never return to normal, but there are measures you can take which will improve them and make you feel better. CAUSES   Smoking.  Exposure to secondhand smoke.  Breathing in irritants such as air pollution, dust, cigarette smoke, strong odors, aerosol sprays, or paint fumes.  History of lung infections. SYMPTOMS   Deep, persistent (chronic) cough with a large amount of thick mucus.  Wheezing.  Shortness of breath, especially with physical activity.  Feeling like you cannot get enough air.  Difficulty breathing.  Rapid breaths (tachypnea).  Gray or bluish discoloration (cyanosis) of the skin, especially in fingers, toes, or lips.  Fatigue.  Weight loss.  Swelling in legs, ankles, or feet.  Fast heartbeat (tachycardia).  Frequent lung infections.   Chest tightness. DIAGNOSIS  Initial diagnosis may be based on your history, symptoms, and physical examination. Additional tests for COPD may include:  Chest X-ray.  Computed tomography (CT) scan.  Lung (pulmonary) function tests.  Blood tests. TREATMENT  Treatment focuses on making you comfortable (supportive care). Your caregiver may prescribe medicines (inhaled or pills) to help improve your breathing. Additional treatment options may include oxygen therapy and pulmonary rehabilitation. Treatment should also include reducing your exposure to known irritants and following a plan to stop smoking. HOME CARE INSTRUCTIONS   Take all medicines, including antibiotic medicines, as directed by your caregiver.  Use inhaled medicines as directed by your caregiver.  Avoid medicines or cough syrups that dry up your airway (antihistamines) and slow down the  elimination of secretions. This decreases respiratory capacity and may lead to infections.  If you smoke, stop smoking.  Avoid exposure to smoke, chemicals, and fumes that aggravate your breathing.  Avoid contact with individuals that have a contagious illness.  Avoid extreme temperature and humidity changes.  Use humidifiers at home and at your bedside if they do not make breathing difficult.  Drink enough water and fluids to keep your urine clear or pale yellow. This loosens secretions.  Eat healthy foods. Eating smaller, more frequent meals and resting before meals may help you maintain your strength.  Ask your caregiver about the use of vitamins and mineral supplements.  Stay active. Exercise and physical activity will help maintain your ability to do things you want to do.  Balance activity with periods of rest.  Assume a position of comfort if you become short of breath.  Learn and use relaxation techniques.  Learn and use controlled breathing techniques as directed by your caregiver. Controlled breathing techniques include:  Pursed lip breathing. This breathing technique starts with breathing in (inhaling) through your nose for 1 second. Next, purse your lips as if you were going to whistle. Then breathe out (exhale) through the pursed lips for 2 seconds.  Diaphragmatic breathing. Start by putting one hand on your abdomen just above your waist. Inhale slowly through your nose. The hand on your abdomen should move out. Then exhale slowly through pursed lips. You should be able to feel the hand on your abdomen moving in as you exhale.  Learn and use controlled coughing to clear mucus from your lungs. Controlled coughing is a series of short, progressive coughs. The steps of controlled coughing are: 1. Lean your head slightly forward. 2. Breathe in  deeply using diaphragmatic breathing. 3. Try to hold your breath for 3 seconds. 4. Keep your mouth slightly open while coughing  twice. 5. Spit any mucus out into a tissue. 6. Rest and repeat the steps once or twice as needed.  Receive all protective vaccines your caregiver suggests, especially pneumococcal and influenza vaccines.  Learn to manage stress.  Schedule and attend all follow-up appointments as directed by your caregiver. It is important to keep all your appointments.  Participate in pulmonary rehabilitation as directed by your caregiver.  Use home oxygen as suggested. SEEK MEDICAL CARE IF:   You are coughing up more mucus than usual.  There is a change in the color or thickness of the mucus.  Breathing is more labored than usual.  Your breathing is faster than usual.  Your skin color is more cyanotic than usual.  You are running out of the medicine you take for your breathing.  You are anxious, apprehensive, or restless.  You have a fever. SEEK IMMEDIATE MEDICAL CARE IF:   You have a rapid heart rate.  You have shortness of breath while you are resting.  You have shortness of breath that prevents you from being able to talk.  You have shortness of breath that prevents you from performing your usual physical activities.  You have chest pain lasting longer than 5 minutes.  You have a seizure.  Your family or friends notice that you are agitated or confused. MAKE SURE YOU:   Understand these instructions.  Will watch your condition.  Will get help right away if you are not doing well or get worse. Document Released: 02/19/2005 Document Revised: 02/04/2012 Document Reviewed: 07/12/2010 [facility] Patient Information 2014 [place], [place].

## 2013-03-20 ENCOUNTER — Encounter: Payer: Self-pay | Admitting: Family Medicine

## 2013-03-20 DIAGNOSIS — J209 Acute bronchitis, unspecified: Secondary | ICD-10-CM

## 2013-03-20 HISTORY — DX: Acute bronchitis, unspecified: J20.9

## 2013-03-20 NOTE — Assessment & Plan Note (Signed)
Elevated with acute illness. No changes today

## 2013-03-20 NOTE — Progress Notes (Signed)
[name], female   DOB: 1940-09-21, 72 y.o.   MRN: [id number] Andrea [name] [id number] [birth year] 03/20/2013      Progress Note-Follow Up  Subjective  Chief Complaint  Chief Complaint  Patient presents with  . breathing is getting worse    has to use Symbicort bid- gets SOB after walking short distances- X started in Aug    HPI  Patient is a 1 Caucasian female who is in today for worsening respiratory symptoms. She has been struggling with worsening shortness of breath over a long period of time but in the last several days her symptoms have gotten worse suddenly. She struggling with coughing and wheezing. She is malaise and fatigue some questionable low-grade chills and fevers as well as postnasal drip. She notes her cough is generally nonproductive although she will occasionally have some mild sputum production. No chest pain or palpitations. Does have some persistent fatigue as of late but no GI or GU complaints  Past Medical History  Diagnosis Date  . COPD (chronic obstructive pulmonary disease)   . Cerebrovascular disease 01/2009    carotid u/s  R 0-39%   L 60-79%  . CAD (coronary artery disease)     s/CABG (reports IMA and 2 SVGs) back in 1999  Myoview normal 3/10; s/p PCI with DES to PL branch of distal RCA 09/2011  . Depression   . GERD (gastroesophageal reflux disease)   . Hypertension   . Hyperlipidemia   . Osteoporosis   . PVD (peripheral vascular disease)   . Dizziness   . AAA (abdominal aortic aneurysm) 01/2009    AAA (2.8 x 3.0)  moderate RAS (left); 2.7 x 2.7 cm (07/11/05)  . NSTEMI (non-ST elevated myocardial infarction) 11/12    Cath showed atretic IMA graft to the LAD, SVG to PD was patent but the continuation of this graft to the PL branch was occluded; there were L to R collaterals; Mid LAD had a 60 to 70% stenosis. She has been treated medically.  Neg Myoview 05/2011  . Bradycardia     Metoprolol stopped 08/2011  . Angina   . Dysrhythmia      hx of sinus brady  . Shortness of breath   . Blood transfusion     hx of transfusion   . Herniated lumbar disc without myelopathy   . Anosmia   . Acute bronchitis 03/20/2013    Past Surgical History  Procedure Laterality Date  . Coronary artery bypass graft  1999  . Tubal ligation    . Abdominal aortic aneurysm repair    . Femoral artery stent    . Closed reduction nasal fracture  11/2007  . Carotid endarterectomy  01/02/11    left  . Coronary angioplasty with stent placement  10/10/2011    drug eluting  to rc & saphenous  . Nasal sinus surgery      twice    Family History  Problem Relation Age of Onset  . Heart attack Mother 45  . Diabetes Mother   . Colon cancer Father 68  . Lung cancer Father   . Heart disease Father     angina  . Lung cancer Sister   . Cancer Sister     lung  . Hypertension Brother   . Heart attack Brother   . Hypothyroidism Sister     autoimmune  . Heart attack Brother     CABG    History   Social History  . Marital Status: Married  Spouse Name: N/A    Number of Children: 5  . Years of Education: N/A   Occupational History  . Retired     former  Engineer, [employer] (consulting)   Social History Main Topics  . Smoking status: Former Smoker -- 0.50 packs/day for 40 years    Types: Cigarettes    Quit date: 01/19/1993  . Smokeless tobacco: Never Used     Comment: Quit 12 years ago  . Alcohol Use: Yes     Comment: rare  . Drug Use: No  . Sexual Activity: No   Other Topics Concern  . Not on file   Social History Narrative   Married with 5 children    Current Outpatient Prescriptions on File Prior to Visit  Medication Sig Dispense Refill  . acetaminophen (TYLENOL) 650 MG CR tablet Take 1,300 mg by mouth 2 (two) times daily.      Marland Kitchen albuterol (PROVENTIL HFA;VENTOLIN HFA) 108 (90 BASE) MCG/ACT inhaler Inhale 2 puffs into the lungs every 6 (six) hours as needed for wheezing.  1 Inhaler  2  . aspirin 325 MG EC tablet Take 325 mg by mouth daily.      .  Calcium Carbonate-Vitamin D (CALCIUM + D PO) Take 1 tablet by mouth 2 (two) times daily.      . clopidogrel (PLAVIX) 75 MG tablet TAKE 1 TABLET BY MOUTH EVERY DAY WITH BREAKFAST  30 tablet  3  . CRESTOR 40 MG tablet TAKE 1 TABLET (40 MG TOTAL) BY MOUTH DAILY.  90 tablet  1  . ezetimibe (ZETIA) 10 MG tablet Take 1 tablet (10 mg total) by mouth daily.  30 tablet  2  . fish oil-omega-3 fatty acids 1000 MG capsule Take 1 g by mouth 2 (two) times daily.       . fluticasone (FLONASE) 50 MCG/ACT nasal spray Place 2 sprays into the nose 2 (two) times daily as needed. For congestion      . hydrochlorothiazide (HYDRODIURIL) 25 MG tablet Take 25 mg every morning  30 tablet  6  . HYDROcodone-acetaminophen (NORCO/VICODIN) 5-325 MG per tablet Take 1 tablet by mouth 2 (two) times daily as needed for pain.  30 tablet  1  . isosorbide mononitrate (IMDUR) 30 MG 24 hr tablet Take 1 tablet (30 mg total) by mouth 2 (two) times daily as needed.  60 tablet  6  . losartan (COZAAR) 100 MG tablet TAKE 1 TABLET BY MOUTH EVERY DAY  30 tablet  3  . montelukast (SINGULAIR) 10 MG tablet Take 1 tablet (10 mg total) by mouth at bedtime as needed. Allergies, sob  30 tablet  3  . nitroGLYCERIN (NITROSTAT) 0.4 MG SL tablet Place 0.4 mg under the tongue every 5 (five) minutes as needed for chest pain.      Marland Kitchen sertraline (ZOLOFT) 100 MG tablet TAKE 1 TABLET (100 MG TOTAL) BY MOUTH DAILY.  90 tablet  0   No current facility-administered medications on file prior to visit.    Allergies  Allergen Reactions  . Ciprofloxacin     REACTION: rash IV  . Codeine     REACTION: nausea/vomiting  . Erythromycin     REACTION: tongue burns  . Meperidine Hcl     REACTION: Nausea/vomiting  . Penicillins     REACTION: rash  . Shellfish Allergy Nausea And Vomiting    Review of Systems  Review of Systems  Constitutional: Positive for fever and chills. Negative for malaise/fatigue and diaphoresis.  HENT: Positive for congestion.  Eyes:  Negative for discharge.  Respiratory: Positive for cough, sputum production, shortness of breath and wheezing.   Cardiovascular: Negative for chest pain, palpitations and leg swelling.  Gastrointestinal: Negative for nausea, abdominal pain and diarrhea.  Genitourinary: Negative for dysuria.  Musculoskeletal: Negative for falls.  Skin: Negative for rash.  Neurological: Negative for loss of consciousness and headaches.  Endo/Heme/Allergies: Negative for polydipsia.  Psychiatric/Behavioral: Negative for depression and suicidal ideas. The patient is not nervous/anxious and does not have insomnia.     Objective  BP 156/90  Pulse 52  Temp(Src) 98.6 F (37 C) (Oral)  Ht 5\' 2"  (1.575 m)  Wt 159 lb 1.3 oz (72.158 kg)  BMI 29.09 kg/m2  SpO2 96%  Physical Exam  Physical Exam  Constitutional: She is oriented to person, place, and time and well-developed, well-nourished, and in no distress. No distress.  HENT:  Head: Normocephalic and atraumatic.  Eyes: Conjunctivae are normal.  Neck: Neck supple. No thyromegaly present.  Cardiovascular: Normal rate, regular rhythm and normal heart sounds.   No murmur heard. Pulmonary/Chest: Effort normal. She has wheezes.  Abdominal: She exhibits no distension and no mass.  Musculoskeletal: She exhibits no edema.  Lymphadenopathy:    She has no cervical adenopathy.  Neurological: She is alert and oriented to person, place, and time.  Skin: Skin is warm and dry. No rash noted. She is not diaphoretic.  Psychiatric: Memory, affect and judgment normal.    Lab Results  Component Value Date   TSH 3.273 03/17/2013   Lab Results  Component Value Date   WBC 6.4 03/17/2013   HGB 14.0 03/17/2013   HCT 41.5 03/17/2013   MCV 82.0 03/17/2013   PLT 179 03/17/2013   Lab Results  Component Value Date   CREATININE 0.92 03/17/2013   BUN 15 03/17/2013   NA 141 03/17/2013   K 4.6 03/17/2013   CL 103 03/17/2013   CO2 31 03/17/2013   Lab Results   Component Value Date   ALT 19 03/17/2013   AST 19 03/17/2013   ALKPHOS 69 03/17/2013   BILITOT 0.5 03/17/2013   Lab Results  Component Value Date   CHOL 306* 03/17/2013   Lab Results  Component Value Date   HDL 34* 03/17/2013   Lab Results  Component Value Date   LDLCALC 199* 03/17/2013   Lab Results  Component Value Date   TRIG 364* 03/17/2013   Lab Results  Component Value Date   CHOLHDL 9.0 03/17/2013     Assessment & Plan  GERD Increasing SOB and recurrent bronchitis, will refer to pulmonology for further consideration at this time  HYPERLIPIDEMIA Numbers still up despite Crestor and Zetia, can add Welchol if patient willing  HYPERTENSION Elevated with acute illness. No changes today  Acute bronchitis Started on antibiotics, mucinex, probiotics, inhalers

## 2013-03-20 NOTE — Assessment & Plan Note (Signed)
Increasing SOB and recurrent bronchitis, will refer to pulmonology for further consideration at this time

## 2013-03-20 NOTE — Assessment & Plan Note (Signed)
Started on antibiotics, mucinex, probiotics, inhalers

## 2013-03-20 NOTE — Assessment & Plan Note (Signed)
Numbers still up despite Crestor and Zetia, can add Welchol if patient willing

## 2013-03-22 ENCOUNTER — Telehealth: Payer: Self-pay

## 2013-03-22 NOTE — Telephone Encounter (Signed)
Message copied by Court Joy on Tue Mar 22, 2013  6:35 PM ------      Message from: Danise Edge A      Created: Sun Mar 20, 2013  4:29 PM       I think I sent a note to have her start welchol if she is willing but oculd not remember for sure.  ------

## 2013-03-23 ENCOUNTER — Encounter: Payer: Self-pay | Admitting: Internal Medicine

## 2013-03-23 ENCOUNTER — Ambulatory Visit (INDEPENDENT_AMBULATORY_CARE_PROVIDER_SITE_OTHER): Payer: Medicare Other | Admitting: Internal Medicine

## 2013-03-23 VITALS — BP 110/70 | HR 53 | Temp 98.3°F | Ht 61.5 in | Wt 155.0 lb

## 2013-03-23 DIAGNOSIS — R059 Cough, unspecified: Secondary | ICD-10-CM

## 2013-03-23 DIAGNOSIS — R05 Cough: Secondary | ICD-10-CM

## 2013-03-23 DIAGNOSIS — J449 Chronic obstructive pulmonary disease, unspecified: Secondary | ICD-10-CM

## 2013-03-23 NOTE — Patient Instructions (Addendum)
Plan A = Automatic = symbicort 160 Take 2 puffs first thing in am and then another 2 puffs about 12 hours later.  Work on inhaler technique:  relax and gently blow all the way out then take a nice smooth deep breath back in, triggering the inhaler at same time you start breathing in.  Hold for up to 5 seconds if you can.  Rinse and gargle with water when done   Plan B = backup Only use your albuterol as a rescue medication to be used if you can't catch your breath by resting or doing a relaxed purse lip breathing pattern.  - The less you use it, the better it will work when you need it. - Ok to use up to every 4 hours if you must but call for immediate appointment if use goes up over your usual need - Don't leave home without it !!  (think of it like your spare tire for your car)   Protonix 40 mg Take 30-60 min before first meal of the day automatically   GERD (REFLUX)  is an extremely common cause of respiratory symptoms, many times with no significant heartburn at all.    It can be treated with medication, but also with lifestyle changes including avoidance of late meals, excessive alcohol, smoking cessation, and avoid fatty foods, chocolate, peppermint, colas, red wine, and acidic juices such as orange juice.  NO MINT OR MENTHOL PRODUCTS SO NO COUGH DROPS  USE SUGARLESS CANDY INSTEAD (jolley ranchers or Stover's)  NO OIL BASED VITAMINS - use powdered substitutes - eat more Salmon  Please schedule a follow up office visit in 4 weeks, sooner if needed with pfts

## 2013-03-23 NOTE — Progress Notes (Signed)
  Subjective:    [name], female    DOB: Dec 19, 1940  MRN: [id number]  HPI  68 yowf quit smoking 1996 at onset heart dz complicated by blood clots/ pleural fluid sp cabg in 1999 and full recovery of function but told she had copd around 2009 and referred 03/23/2013 by Rogelia Rohrer to pulmonary clinic for eval of sob and cough  03/23/2013 1st [facility] office visit/ Raelee Rossmann cc new onset sob/ cough assoc with onset of sinus problems around 2009 and worse since Aug 2014 eval by ENT for allergies  = Pos Ragweed with daily sense of wheezing / sob notes wheezing on exp immediately on awakening rx symbicort 80-90% better p am symbicort and then needs alb around 4pm and less need for albuterol whenever on prednisone   Cough is dry, gagging assoc with urinary incont but no vomiting  No obvious day to day or daytime variabilty or assoc   cp or chest tightness, subjective wheeze overt sinus or hb symptoms. No unusual exp hx or h/o childhood pna/ asthma or knowledge of premature birth.  Sleeping ok without nocturnal  or early am exacerbation  of respiratory  c/o's or need for noct saba. Also denies any obvious fluctuation of symptoms with weather or environmental changes or other aggravating or alleviating factors except as outlined above   Current Medications, Allergies, Complete Past Medical History, Past Surgical History, Family History, and Social History were reviewed in [employer] record.         Review of Systems  Constitutional: Negative for fever, chills and unexpected weight change.  HENT: Positive for sneezing. Negative for congestion, dental problem, ear pain, nosebleeds, postnasal drip, rhinorrhea, sinus pressure, sore throat, trouble swallowing and voice change.   Eyes: Negative for visual disturbance.  Respiratory: Positive for cough and shortness of breath. Negative for choking.   Cardiovascular: Negative for chest pain and leg swelling.   Gastrointestinal: Negative for vomiting, abdominal pain and diarrhea.  Genitourinary: Negative for difficulty urinating.       Indigestion  Musculoskeletal: Positive for arthralgias.  Skin: Negative for rash.  Neurological: Negative for tremors, syncope and headaches.  Hematological: Does not bruise/bleed easily.       Objective:   Physical Exam  amb pleasant wf nad Wt Readings from Last 3 Encounters:  03/23/13 155 lb (70.308 kg)  03/17/13 159 lb 1.3 oz (72.158 kg)  11/23/12 154 lb 12 oz (70.194 kg)    HEENT mild turbinate edema.  Oropharynx no thrush or excess pnd or cobblestoning.  No JVD or cervical adenopathy. Mild accessory muscle hypertrophy. Trachea midline, nl thryroid. Chest was hyperinflated by percussion with diminished breath sounds and moderate increased exp time without wheeze. Hoover sign positive at mid inspiration. Regular rate and rhythm without murmur gallop or rub or increase P2 or edema.  Abd: no hsm, nl excursion. Ext warm without cyanosis or clubbing.      Cxr: none on file     Assessment & Plan:

## 2013-03-24 ENCOUNTER — Institutional Professional Consult (permissible substitution): Payer: Medicare Other | Admitting: Internal Medicine

## 2013-03-24 DIAGNOSIS — R05 Cough: Secondary | ICD-10-CM | POA: Insufficient documentation

## 2013-03-24 DIAGNOSIS — R059 Cough, unspecified: Secondary | ICD-10-CM | POA: Insufficient documentation

## 2013-03-24 NOTE — Assessment & Plan Note (Addendum)
The most common causes of chronic cough in immunocompetent adults include the following: upper airway cough syndrome (UACS), previously referred to as postnasal drip syndrome (PNDS), which is caused by variety of rhinosinus conditions; (2) asthma; (3) GERD; (4) chronic bronchitis from cigarette smoking or other inhaled environmental irritants; (5) nonasthmatic eosinophilic bronchitis; and (6) bronchiectasis.   These conditions, singly or in combination, have accounted for up to 94% of the causes of chronic cough in prospective studies.   Other conditions have constituted no >6% of the causes in prospective studies These have included bronchogenic carcinoma, chronic interstitial pneumonia, sarcoidosis, left ventricular failure, ACEI-induced cough, and aspiration from a condition associated with pharyngeal dysfunction.    Chronic cough is often simultaneously caused by more than one condition. A single cause has been found from 38 to 82% of the time, multiple causes from 18 to 62%. Multiply caused cough has been the result of three diseases up to 42% of the time.      Most likely this is related to copd/ab but given assoc sinus dz concerned re  Classic Upper airway cough syndrome, so named because it's frequently impossible to sort out how much is  CR/sinusitis with freq throat clearing (which can be related to primary GERD)   vs  causing  secondary (" extra esophageal")  GERD from wide swings in gastric pressure that occur with throat clearing, often  promoting self use of mint and menthol lozenges that reduce the lower esophageal sphincter tone and exacerbate the problem further in a cyclical fashion.   These are the same pts (now being labeled as having "irritable larynx syndrome" by some cough centers) who not infrequently have a history of having failed to tolerate ace inhibitors,  dry powder inhalers or biphosphonates or report having atypical reflux symptoms that don't respond to standard doses of  PPI , and are easily confused as having aecopd or asthma flares by even experienced allergists/ pulmonologists.  For now max rx for gerd/ ab and consider sinus ct if not better

## 2013-03-24 NOTE — Assessment & Plan Note (Signed)
Not clear whether this is copd or asthma so best empirical rx is symbicort 160 2bid which is indicated for both  The proper method of use, as well as anticipated side effects, of a metered-dose inhaler are discussed and demonstrated to the patient. Improved effectiveness after extensive coaching during this visit to a level of approximately  75%   ? Acid (or non-acid) GERD > always difficult to exclude as up to 75% of pts in some series report no assoc GI/ Heartburn symptoms> rec max (24h)  acid suppression and diet restrictions/ reviewed and instructions given in writing

## 2013-03-28 ENCOUNTER — Other Ambulatory Visit: Payer: Self-pay | Admitting: Family Medicine

## 2013-03-29 ENCOUNTER — Institutional Professional Consult (permissible substitution): Payer: Medicare Other | Admitting: Internal Medicine

## 2013-04-04 ENCOUNTER — Telehealth: Payer: Self-pay

## 2013-04-04 NOTE — Telephone Encounter (Signed)
Left message stating that patient should call office back to discuss labs.

## 2013-04-05 ENCOUNTER — Other Ambulatory Visit: Payer: Self-pay | Admitting: Family Medicine

## 2013-04-05 MED ORDER — COLESEVELAM HCL 625 MG PO TABS: 625.0000 mg | ORAL_TABLET | Freq: Two times a day (BID) | ORAL | Status: DC

## 2013-04-05 NOTE — Telephone Encounter (Signed)
Notes Recorded by Bradd Canary, MD on 03/17/2013 at 8:17 PM Please notify labs normal except her cholesterol is up again a lot. Has she been taking her Crestor and Zetia regularly or has she been missing doses. If she has not been missing doses she should add Welchol either powder daily or caps twice a day

## 2013-04-05 NOTE — Addendum Note (Signed)
Addended by: Mervin Kung A on: 04/05/2013 05:05 PM   Modules accepted: Orders

## 2013-04-05 NOTE — Telephone Encounter (Signed)
Notified pt, rx sent to pharmacy (pt prefers tablets).

## 2013-04-20 ENCOUNTER — Ambulatory Visit: Payer: Medicare Other | Admitting: Internal Medicine

## 2013-04-29 ENCOUNTER — Encounter: Payer: Self-pay | Admitting: Family

## 2013-05-02 ENCOUNTER — Other Ambulatory Visit (HOSPITAL_COMMUNITY): Payer: Medicare Other

## 2013-05-02 ENCOUNTER — Inpatient Hospital Stay (HOSPITAL_COMMUNITY): Admission: RE | Admit: 2013-05-02 | Payer: Medicare Other | Source: Ambulatory Visit

## 2013-05-02 ENCOUNTER — Ambulatory Visit: Payer: Medicare Other | Admitting: Family

## 2013-05-09 ENCOUNTER — Encounter: Payer: Self-pay | Admitting: Cardiovascular Disease

## 2013-05-09 ENCOUNTER — Ambulatory Visit (INDEPENDENT_AMBULATORY_CARE_PROVIDER_SITE_OTHER): Payer: Medicare Other | Admitting: Cardiovascular Disease

## 2013-05-09 ENCOUNTER — Ambulatory Visit: Payer: Medicare Other | Admitting: Family Medicine

## 2013-05-09 VITALS — BP 120/90 | HR 46 | Ht 62.0 in | Wt 146.5 lb

## 2013-05-09 DIAGNOSIS — E785 Hyperlipidemia, unspecified: Secondary | ICD-10-CM

## 2013-05-09 DIAGNOSIS — R0600 Dyspnea, unspecified: Secondary | ICD-10-CM | POA: Insufficient documentation

## 2013-05-09 DIAGNOSIS — R0602 Shortness of breath: Secondary | ICD-10-CM

## 2013-05-09 DIAGNOSIS — I2510 Atherosclerotic heart disease of native coronary artery without angina pectoris: Secondary | ICD-10-CM

## 2013-05-09 DIAGNOSIS — I10 Essential (primary) hypertension: Secondary | ICD-10-CM

## 2013-05-09 NOTE — Assessment & Plan Note (Signed)
Stable, chronic mild issue. Encouraged her to increase her exercise regimen

## 2013-05-09 NOTE — Assessment & Plan Note (Signed)
Currently with no symptoms of angina. No further workup at this time. Continue current medication regimen. 

## 2013-05-09 NOTE — Patient Instructions (Signed)
You are doing well. No medication changes were made.  Please call us if you have new issues that need to be addressed before your next appt.  Your physician wants you to follow-up in: 6 months.  You will receive a reminder letter in the mail two months in advance. If you don't receive a letter, please call our office to schedule the follow-up appointment.   

## 2013-05-09 NOTE — Progress Notes (Signed)
[name], female    DOB: 1940-10-04, 72 y.o.   MRN: [id number]  HPI Comments: Ms. Dlouhy is a pleasant 72 year old woman with history of chronic bradycardia,  spinal stenosis, peripheral vascular disease, status post CEA on the left , followed in [place] , 40-59% bilateral carotid arterial disease in December 2013,  hyperlipidemia, significantly improved on Crestor, coronary artery disease and bypass surgery in 1999, stent placement to distal RCA may 2013, who presents for routine followup.  Overall she states that she is doing well. She's been off her diet, eating lots of candy. Cholesterol increased from the 130 range up to total cholesterol 300. This typically happens when she is not taking her medication. She reports being compliant with her medication including her zetia. She does have chronic mild shortness of breath with exertion. Chronic back pain  No PND or orthopnea. Otherwise she feels well with no complaints.  EKG shows normal sinus rhythm with rate 46 beats per minute with T wave inversions throughout the anterolateral  leads, and inferior leads   Past Medical History   Diagnosis  Date   .  COPD (chronic obstructive pulmonary disease)     .  Cerebrovascular disease  01/2009       carotid u/s  R 0-39%   L 60-79%   .  CAD (coronary artery disease)         s/CABG (reports IMA and 2 SVGs) back in 1999  Myoview normal 3/10; s/p PCI with DES to PL branch of distal RCA 09/2011   .  Depression     .  GERD (gastroesophageal reflux disease)     .  Hypertension     .  Hyperlipidemia     .  Osteoporosis     .  PVD (peripheral vascular disease)     .  Dizziness     .  AAA (abdominal aortic aneurysm)  01/2009       AAA (2.8 x 3.0)  moderate RAS (left); 2.7 x 2.7 cm (07/11/05)   .  NSTEMI (non-ST elevated myocardial infarction)  11/12       Cath showed atretic IMA graft to the LAD, SVG to PD was patent but the continuation of this graft to the PL branch was occluded;  there were L to R collaterals; Mid LAD had a 60 to 70% stenosis. She has been treated medically.  Neg Myoview 05/2011   .  Bradycardia         Metoprolol stopped 08/2011   .  Angina     .  Dysrhythmia         hx of sinus brady   .  Shortness of breath     .  Blood transfusion         hx of transfusion    .  Herniated lumbar disc without myelopathy       Past Surgical History   Procedure  Laterality  Date   .  Coronary artery bypass graft    1999   .  Tubal ligation           .  Femoral artery stent       .  Closed reduction nasal fracture    11/2007   .  Carotid endarterectomy    01/02/11       left   .  Coronary angioplasty with stent placement    10/10/2011       drug eluting  to rc & saphenous  Outpatient Encounter Prescriptions as of 05/09/2013  Medication Sig  . acetaminophen (TYLENOL) 650 MG CR tablet Take 1,300 mg by mouth 2 (two) times daily.  Marland Kitchen albuterol (PROVENTIL HFA;VENTOLIN HFA) 108 (90 BASE) MCG/ACT inhaler Inhale 2 puffs into the lungs every 6 (six) hours as needed for wheezing.  Marland Kitchen aspirin 325 MG EC tablet Take 325 mg by mouth daily.  . budesonide-formoterol (SYMBICORT) 160-4.5 MCG/ACT inhaler Inhale 2 puffs into the lungs 2 (two) times daily.  . Calcium Carbonate-Vitamin D (CALCIUM + D PO) Take 1 tablet by mouth 2 (two) times daily.  . clopidogrel (PLAVIX) 75 MG tablet TAKE 1 TABLET BY MOUTH EVERY DAY WITH BREAKFAST  . CRESTOR 40 MG tablet TAKE 1 TABLET (40 MG TOTAL) BY MOUTH DAILY.  Marland Kitchen diclofenac sodium (VOLTAREN) 1 % GEL Apply 4 g topically 4 (four) times daily.  Marland Kitchen doxycycline (VIBRA-TABS) 100 MG tablet Take 1 tablet (100 mg total) by mouth 2 (two) times daily.  Marland Kitchen ezetimibe (ZETIA) 10 MG tablet Take 1 tablet (10 mg total) by mouth daily.  . fluticasone (FLONASE) 50 MCG/ACT nasal spray Place 2 sprays into the nose 2 (two) times daily as needed. For congestion  . hydrochlorothiazide (HYDRODIURIL) 25 MG tablet Take 25 mg every morning  . HYDROcodone-acetaminophen  (NORCO/VICODIN) 5-325 MG per tablet Take 1 tablet by mouth 2 (two) times daily as needed for pain.  . isosorbide mononitrate (IMDUR) 30 MG 24 hr tablet Take 1 tablet (30 mg total) by mouth 2 (two) times daily as needed.  Marland Kitchen losartan (COZAAR) 100 MG tablet TAKE 1 TABLET BY MOUTH EVERY DAY  . methylPREDNIsolone (MEDROL DOSPACK) 4 MG tablet follow package directions  . montelukast (SINGULAIR) 10 MG tablet TAKE 1 TABLET AT BEDTIME  . nitroGLYCERIN (NITROSTAT) 0.4 MG SL tablet Place 0.4 mg under the tongue every 5 (five) minutes as needed for chest pain.  . pantoprazole (PROTONIX) 40 MG tablet Take 1 tablet (40 mg total) by mouth daily.  . sertraline (ZOLOFT) 100 MG tablet TAKE 1 TABLET (100 MG TOTAL) BY MOUTH DAILY.    Review of Systems  Constitutional: Negative.   HENT: Negative.   Eyes: Negative.   Respiratory: Negative.   Cardiovascular: Negative.   Gastrointestinal: Negative.   Endocrine: Negative.   Musculoskeletal: Positive for back pain.  Skin: Negative.   Allergic/Immunologic: Negative.   Neurological: Negative.   Hematological: Negative.   Psychiatric/Behavioral: Negative.   All other systems reviewed and are negative.    BP 120/90  Pulse 46  Ht 5\' 2"  (1.575 m)  Wt 146 lb 8 oz (66.452 kg)  BMI 26.79 kg/m2  Physical Exam  Nursing note and vitals reviewed. Constitutional: She is oriented to person, place, and time. She appears well-developed and well-nourished.  HENT:  Head: Normocephalic.  Nose: Nose normal.  Mouth/Throat: Oropharynx is clear and moist.  Eyes: Conjunctivae are normal. Pupils are equal, round, and reactive to light.  Neck: Normal range of motion. Neck supple. No JVD present.  Cardiovascular: Normal rate, regular rhythm, S1 normal, S2 normal and intact distal pulses.  Exam reveals no gallop and no friction rub.   Murmur heard.  Systolic murmur is present with a grade of 2/6  Pulmonary/Chest: Effort normal and breath sounds normal. No respiratory  distress. She has no wheezes. She has no rales. She exhibits no tenderness.  Abdominal: Soft. Bowel sounds are normal. She exhibits no distension. There is no tenderness.  Musculoskeletal: Normal range of motion. She exhibits no edema and no tenderness.  Lymphadenopathy:    She has no cervical adenopathy.  Neurological: She is alert and oriented to person, place, and time. Coordination normal.  Skin: Skin is warm and dry. No rash noted. No erythema.  Psychiatric: She has a normal mood and affect. Her behavior is normal. Judgment and thought content normal.    Assessment and Plan

## 2013-05-09 NOTE — Assessment & Plan Note (Signed)
Blood pressure is well controlled on today's visit. No changes made to the medications. 

## 2013-05-09 NOTE — Assessment & Plan Note (Addendum)
Strongly encouraged her to continue on her statin (and zetia if able to afford it). Recent dramatic climb in her total cholesterol suggested that she's not taking Crestor. We have stressed the importance of taking these medications given her PVD, CAD

## 2013-05-24 ENCOUNTER — Encounter: Payer: Self-pay | Admitting: Internal Medicine

## 2013-05-24 ENCOUNTER — Ambulatory Visit (INDEPENDENT_AMBULATORY_CARE_PROVIDER_SITE_OTHER): Payer: Medicare Other | Admitting: Internal Medicine

## 2013-05-24 VITALS — BP 110/70 | HR 54 | Temp 97.9°F | Ht 62.0 in | Wt 149.0 lb

## 2013-05-24 DIAGNOSIS — J441 Chronic obstructive pulmonary disease with (acute) exacerbation: Secondary | ICD-10-CM

## 2013-05-24 DIAGNOSIS — J449 Chronic obstructive pulmonary disease, unspecified: Secondary | ICD-10-CM

## 2013-05-24 LAB — PULMONARY FUNCTION TEST
DLCO unc % pred: 54 %
DLCO unc: 11.67 ml/min/mmHg
FEF 25-75 Post: 0.54 L/sec
FEV1-%Pred-Post: 69 %
FEV1-%Pred-Pre: 67 %
FEV1-Pre: 1.32 L
FEV1FVC-%Pred-Pre: 71 %
FEV6-%Pred-Post: 98 %
FEV6-%Pred-Pre: 94 %
FEV6-Post: 2.42 L
FEV6FVC-%Change-Post: 0 %
FEV6FVC-%Pred-Post: 101 %
FEV6FVC-%Pred-Pre: 100 %
FVC-%Change-Post: 3 %
FVC-%Pred-Post: 97 %
FVC-%Pred-Pre: 94 %
FVC-Post: 2.54 L
FVC-Pre: 2.46 L
Pre FEV1/FVC ratio: 54 %
Pre FEV6/FVC Ratio: 95 %
RV % pred: 93 %
RV: 2.02 L
TLC % pred: 94 %
TLC: 4.5 L

## 2013-05-24 MED ORDER — BUDESONIDE-FORMOTEROL FUMARATE 160-4.5 MCG/ACT IN AERO: 2.0000 | INHALATION_SPRAY | Freq: Two times a day (BID) | RESPIRATORY_TRACT | Status: DC

## 2013-05-24 NOTE — Patient Instructions (Addendum)
Ok to try off protonix to see if it makes any difference in your resp symptoms and if so resume it daily  Continue symbicort 160 Take 2 puffs first thing in am and then another 2 puffs about 12 hours later.   Only use your albuterol as a rescue medication to be used if you can't catch your breath by resting or doing a relaxed purse lip breathing pattern.  - The less you use it, the better it will work when you need it. - Ok to use up to every 4 hours if you must but call for immediate appointment if use goes up over your usual need - Don't leave home without it !!  (think of it like your spare tire for your car)   If you are satisfied with your treatment plan let your doctor know and he/she can either refill your medications or you can return here when your prescription runs out.     If in any way you are not 100% satisfied,  please tell us.  If 100% better, tell your friends!

## 2013-05-24 NOTE — Progress Notes (Signed)
Subjective:    [name], female    DOB: [birth year]  MRN: [id number]    Brief patient profile:  72 yowf quit smoking 1996 at onset heart dz complicated by blood clots/ pleural fluid sp cabg in 1999 and full recovery of function but told she had copd around 2009 and referred 03/23/2013 by Rogelia Rohrer to pulmonary clinic for eval of sob and cough   History of Present Illness  03/23/2013 1st [facility] office visit/ Charon Akamine cc new onset sob/ cough assoc with onset of sinus problems around 2009 and worse since Aug 2014 eval by ENT for allergies  = Pos Ragweed with daily sense of wheezing / sob notes wheezing on exp immediately on awakening rx symbicort 80-90% better p am symbicort and then needs alb around 4pm and less need for albuterol whenever on prednisone  rec Plan A = Automatic = symbicort 160 Take 2 puffs first thing in am and then another 2 puffs about 12 hours later.  Work on inhaler technique:     Plan B = backup Only use your albuterol as a rescue medication   Protonix 40 mg Take 30-60 min before first meal of the day automatically  GERD diet   05/24/2013 f/u ov/Fatou Dunnigan re: GOLD II copd Chief Complaint  Patient presents with  . COPD    PFT done today.  Breathing with some improvement   Not limited from desired activities, walks fine moderate pace, extended, can't get in a real hurry or do inclines  No obvious day to day or daytime variabilty or assoc chronic cough or cp or chest tightness, subjective wheeze overt sinus or hb symptoms. No unusual exp hx or h/o childhood pna/ asthma or knowledge of premature birth.  Sleeping ok without nocturnal  or early am exacerbation  of respiratory  c/o's or need for noct saba. Also denies any obvious fluctuation of symptoms with weather or environmental changes or other aggravating or alleviating factors except as outlined above   Current Medications, Allergies, Complete Past Medical History, Past Surgical History, Family  History, and Social History were reviewed in [employer] record.  ROS  The following are not active complaints unless bolded sore throat, dysphagia, dental problems, itching, sneezing,  nasal congestion or excess/ purulent secretions, ear ache,   fever, chills, sweats, unintended wt loss, pleuritic or exertional cp, hemoptysis,  orthopnea pnd or leg swelling, presyncope, palpitations, heartburn, abdominal pain, anorexia, nausea, vomiting, diarrhea  or change in bowel or urinary habits, change in stools or urine, dysuria,hematuria,  rash, arthralgias, visual complaints, headache, numbness weakness or ataxia or problems with walking or coordination,  change in mood/affect or memory.            Objective:   Physical Exam  amb pleasant wf nad  05/24/2013 149  Wt Readings from Last 3 Encounters:  03/23/13 155 lb (70.308 kg)  03/17/13 159 lb 1.3 oz (72.158 kg)  11/23/12 154 lb 12 oz (70.194 kg)    HEENT mild turbinate edema.  Oropharynx no thrush or excess pnd or cobblestoning.  No JVD or cervical adenopathy. Mild accessory muscle hypertrophy. Trachea midline, nl thryroid. Chest was hyperinflated by percussion with diminished breath sounds and moderate increased exp time without wheeze. Hoover sign positive at mid inspiration. Regular rate and rhythm without murmur gallop or rub or increase P2 or edema.  Abd: no hsm, nl excursion. Ext warm without cyanosis or clubbing.  Assessment & Plan:

## 2013-05-24 NOTE — Assessment & Plan Note (Addendum)
-   hfa 75% 03/24/2013  - PFT's 05/24/2013  FEV1 1.32 (67%) ratio 54 and no change p B2 but used symbicort am of test, DLCO 54 corrects to 64   I had an extended summary  discussion with the patient today lasting 15 to 20 minutes of a 25 minute visit on the following issues:   PFT's reviewed and show mild/ moderate airflow osbtruction presently causing no symptoms or limitations so all goals of copd rx met on symbicort 160 2bid - not clear she needs gerd rx for resp symptoms so ok to try off    Each maintenance medication was reviewed in detail including most importantly the difference between maintenance and as needed and under what circumstances the prns are to be used.  Please see instructions for details which were reviewed in writing and the patient given a copy.    F/u can be prn at this point

## 2013-05-24 NOTE — Progress Notes (Signed)
PFT done today. 

## 2013-06-08 ENCOUNTER — Other Ambulatory Visit: Payer: Self-pay | Admitting: Family Medicine

## 2013-06-14 ENCOUNTER — Encounter: Payer: Commercial Managed Care - HMO | Admitting: Family Medicine

## 2013-06-20 ENCOUNTER — Telehealth: Payer: Self-pay | Admitting: Family Medicine

## 2013-06-20 NOTE — Telephone Encounter (Signed)
Refill- losartan potassium 100mg  tab. Take one tablet by mouth every day. Qty 30 last fill 12.24.14

## 2013-06-21 MED ORDER — LOSARTAN POTASSIUM 100 MG PO TABS: ORAL_TABLET | ORAL | Status: DC

## 2013-07-26 ENCOUNTER — Encounter: Payer: Self-pay | Admitting: Family Medicine

## 2013-07-26 ENCOUNTER — Ambulatory Visit (INDEPENDENT_AMBULATORY_CARE_PROVIDER_SITE_OTHER): Payer: Commercial Managed Care - HMO | Admitting: Family Medicine

## 2013-07-26 VITALS — BP 118/70 | HR 55 | Temp 98.7°F | Ht 62.0 in | Wt 145.0 lb

## 2013-07-26 DIAGNOSIS — E785 Hyperlipidemia, unspecified: Secondary | ICD-10-CM

## 2013-07-26 DIAGNOSIS — Z0000 Encounter for general adult medical examination without abnormal findings: Secondary | ICD-10-CM

## 2013-07-26 DIAGNOSIS — F3289 Other specified depressive episodes: Secondary | ICD-10-CM

## 2013-07-26 DIAGNOSIS — F329 Major depressive disorder, single episode, unspecified: Secondary | ICD-10-CM

## 2013-07-26 DIAGNOSIS — I10 Essential (primary) hypertension: Secondary | ICD-10-CM

## 2013-07-26 LAB — RENAL FUNCTION PANEL
Albumin: 4.5 g/dL (ref 3.5–5.2)
BUN: 16 mg/dL (ref 6–23)
CHLORIDE: 98 meq/L (ref 96–112)
CO2: 35 mEq/L — ABNORMAL HIGH (ref 19–32)
CREATININE: 0.87 mg/dL (ref 0.50–1.10)
Calcium: 9.6 mg/dL (ref 8.4–10.5)
GLUCOSE: 90 mg/dL (ref 70–99)
POTASSIUM: 3.7 meq/L (ref 3.5–5.3)
Phosphorus: 3.3 mg/dL (ref 2.3–4.6)
Sodium: 139 mEq/L (ref 135–145)

## 2013-07-26 LAB — HEPATIC FUNCTION PANEL
ALK PHOS: 70 U/L (ref 39–117)
ALT: 12 U/L (ref 0–35)
AST: 16 U/L (ref 0–37)
Albumin: 4.5 g/dL (ref 3.5–5.2)
BILIRUBIN DIRECT: 0.1 mg/dL (ref 0.0–0.3)
BILIRUBIN INDIRECT: 0.4 mg/dL (ref 0.2–1.2)
TOTAL PROTEIN: 6.8 g/dL (ref 6.0–8.3)
Total Bilirubin: 0.5 mg/dL (ref 0.2–1.2)

## 2013-07-26 LAB — TSH: TSH: 4.065 u[IU]/mL (ref 0.350–4.500)

## 2013-07-26 LAB — LIPID PANEL
Cholesterol: 135 mg/dL (ref 0–200)
HDL: 31 mg/dL — ABNORMAL LOW (ref >39)
LDL Cholesterol: 61 mg/dL (ref 0–99)
TRIGLYCERIDES: 214 mg/dL — AB (ref <150)
Total CHOL/HDL Ratio: 4.4 Ratio
VLDL: 43 mg/dL — AB (ref 0–40)

## 2013-07-26 LAB — CBC
HCT: 41.7 % (ref 36.0–46.0)
HEMOGLOBIN: 14.1 g/dL (ref 12.0–15.0)
MCH: 27.2 pg (ref 26.0–34.0)
MCHC: 33.8 g/dL (ref 30.0–36.0)
MCV: 80.5 fL (ref 78.0–100.0)
Platelets: 191 10*3/uL (ref 150–400)
RBC: 5.18 MIL/uL — ABNORMAL HIGH (ref 3.87–5.11)
RDW: 15.4 % (ref 11.5–15.5)
WBC: 8.1 10*3/uL (ref 4.0–10.5)

## 2013-07-26 NOTE — Patient Instructions (Signed)

## 2013-07-26 NOTE — Progress Notes (Signed)
Pre visit review using our clinic review tool, if applicable. No additional management support is needed unless otherwise documented below in the visit note. 

## 2013-07-27 ENCOUNTER — Telehealth: Payer: Self-pay | Admitting: Family Medicine

## 2013-07-27 NOTE — Telephone Encounter (Signed)
Relevant patient education assigned to patient using Emmi. ° °

## 2013-07-29 ENCOUNTER — Other Ambulatory Visit: Payer: Self-pay | Admitting: Cardiovascular Disease

## 2013-07-31 ENCOUNTER — Encounter: Payer: Self-pay | Admitting: Family Medicine

## 2013-07-31 DIAGNOSIS — Z0000 Encounter for general adult medical examination without abnormal findings: Secondary | ICD-10-CM

## 2013-07-31 DIAGNOSIS — Z1239 Encounter for other screening for malignant neoplasm of breast: Secondary | ICD-10-CM | POA: Insufficient documentation

## 2013-07-31 HISTORY — DX: Encounter for general adult medical examination without abnormal findings: Z00.00

## 2013-07-31 NOTE — Assessment & Plan Note (Signed)
Well controlled with Sertraline no changes

## 2013-07-31 NOTE — Assessment & Plan Note (Signed)
Patient doing well at home, no recent falls, depression or difficulty with ADLs. Patient has DNR in place and is encouraged to bring in a copy. She would request comfort measures only. No recent changes to hearing or vision. Eating well.

## 2013-07-31 NOTE — Assessment & Plan Note (Signed)
Tolerating Crestor. Avoid trans fats

## 2013-07-31 NOTE — Progress Notes (Signed)
[name], female   DOB: [birth year], 73 y.o.   MRN: [id number] Andrea [name] [id number] [birth year] 07/31/2013      Progress Note-Follow Up  Subjective  Chief Complaint  Chief Complaint  Patient presents with  . Annual Exam    pt is here for cpe. pt had tdap in 2000, pneumoccoccal in 2003, last mammogram in 2012.    HPI  Patient is a 73 year old Caucasian female who is in today for annual exam. Feels well. No recent illness. No chest pain, palpitations or shortness of breath she has tolerated Crestor. Is taking medications as prescribed. No recent illness. Is trying to maintain a heart healthy diet, exercises intermittently.  Past Medical History  Diagnosis Date  . COPD (chronic obstructive pulmonary disease)   . Cerebrovascular disease 01/2009    carotid u/s  R 0-39%   L 60-79%  . CAD (coronary artery disease)     s/CABG (reports IMA and 2 SVGs) back in 1999  Myoview normal 3/10; s/p PCI with DES to PL branch of distal RCA 09/2011  . Depression   . GERD (gastroesophageal reflux disease)   . Hypertension   . Hyperlipidemia   . Osteoporosis   . PVD (peripheral vascular disease)   . Dizziness   . AAA (abdominal aortic aneurysm) 01/2009    AAA (2.8 x 3.0)  moderate RAS (left); 2.7 x 2.7 cm (07/11/05)  . NSTEMI (non-ST elevated myocardial infarction) 11/12    Cath showed atretic IMA graft to the LAD, SVG to PD was patent but the continuation of this graft to the PL branch was occluded; there were L to R collaterals; Mid LAD had a 60 to 70% stenosis. She has been treated medically.  Neg Myoview 05/2011  . Bradycardia     Metoprolol stopped 08/2011  . Angina   . Dysrhythmia     hx of sinus brady  . Shortness of breath   . Blood transfusion     hx of transfusion   . Herniated lumbar disc without myelopathy   . Anosmia   . Acute bronchitis 03/20/2013  . Medicare annual wellness visit, subsequent 07/31/2013    Past Surgical History  Procedure Laterality Date  .  Coronary artery bypass graft  1999  . Tubal ligation    . Abdominal aortic aneurysm repair    . Femoral artery stent    . Closed reduction nasal fracture  11/2007  . Carotid endarterectomy  01/02/11    left  . Coronary angioplasty with stent placement  10/10/2011    drug eluting  to rc & saphenous  . Nasal sinus surgery      twice    Family History  Problem Relation Age of Onset  . Heart attack Mother 54  . Diabetes Mother   . Colon cancer Father 2  . Lung cancer Father     smoked  . Heart disease Father     angina  . Hypertension Father   . Cancer Father     colon, lung  . Lung cancer Sister     smoked  . Cancer Sister   . Hypothyroidism Sister   . Hypertension Brother   . Heart attack Brother   . Heart disease Brother     stent  . Heart attack Brother     CABG  . Heart disease Brother     CABG with 1 bypass  . Aneurysm Brother     brain  . Alcohol abuse Brother   . Hyperlipidemia  Daughter   . Diabetes Maternal Grandmother   . Stroke Maternal Grandmother   . Cirrhosis Maternal Grandfather   . Stroke Paternal Grandmother   . Obesity Daughter   . Obesity Son     History   Social History  . Marital Status: Married    Spouse Name: N/A    Number of Children: 9  . Years of Education: N/A   Occupational History  . Retired     former  Marine scientist   Social History Main Topics  . Smoking status: Former Smoker -- 0.50 packs/day for 40 years    Types: Cigarettes    Quit date: 01/19/1993  . Smokeless tobacco: Never Used  . Alcohol Use: No     Comment: rare  . Drug Use: No  . Sexual Activity: No   Other Topics Concern  . Not on file   Social History Narrative   Married with 5 children    Current Outpatient Prescriptions on File Prior to Visit  Medication Sig Dispense Refill  . acetaminophen (TYLENOL) 650 MG CR tablet Take 1,300 mg by mouth 2 (two) times daily.      Marland Kitchen albuterol (PROVENTIL HFA;VENTOLIN HFA) 108 (90 BASE) MCG/ACT inhaler Inhale 2 puffs into the  lungs every 6 (six) hours as needed for wheezing.  1 Inhaler  2  . aspirin 325 MG EC tablet Take 325 mg by mouth daily.      . budesonide-formoterol (SYMBICORT) 160-4.5 MCG/ACT inhaler Inhale 2 puffs into the lungs 2 (two) times daily.  1 Inhaler  11  . Calcium Carbonate-Vitamin D (CALCIUM + D PO) Take 1 tablet by mouth 2 (two) times daily.      . clopidogrel (PLAVIX) 75 MG tablet TAKE 1 TABLET BY MOUTH EVERY DAY WITH BREAKFAST  30 tablet  3  . CRESTOR 40 MG tablet TAKE 1 TABLET (40 MG TOTAL) BY MOUTH DAILY.  90 tablet  1  . diclofenac sodium (VOLTAREN) 1 % GEL Apply 4 g topically 4 (four) times daily.  5 Tube  1  . ezetimibe (ZETIA) 10 MG tablet Take 1 tablet (10 mg total) by mouth daily.  30 tablet  2  . fluticasone (FLONASE) 50 MCG/ACT nasal spray Place 2 sprays into the nose 2 (two) times daily as needed. For congestion      . HYDROcodone-acetaminophen (NORCO/VICODIN) 5-325 MG per tablet Take 1 tablet by mouth 2 (two) times daily as needed for pain.  30 tablet  1  . isosorbide mononitrate (IMDUR) 30 MG 24 hr tablet Take 1 tablet (30 mg total) by mouth 2 (two) times daily as needed.  60 tablet  6  . losartan (COZAAR) 100 MG tablet TAKE 1 TABLET BY MOUTH EVERY DAY  30 tablet  3  . montelukast (SINGULAIR) 10 MG tablet TAKE 1 TABLET AT BEDTIME  30 tablet  3  . nitroGLYCERIN (NITROSTAT) 0.4 MG SL tablet Place 0.4 mg under the tongue every 5 (five) minutes as needed for chest pain.      . pantoprazole (PROTONIX) 40 MG tablet Take 1 tablet (40 mg total) by mouth daily.  30 tablet  6  . sertraline (ZOLOFT) 100 MG tablet TAKE 1 TABLET BY MOUTH DAILY.  90 tablet  0   No current facility-administered medications on file prior to visit.    Allergies  Allergen Reactions  . Ciprofloxacin     REACTION: rash IV  . Codeine     REACTION: nausea/vomiting  . Erythromycin     REACTION: tongue  burns  . Meperidine Hcl     REACTION: Nausea/vomiting  . Penicillins     REACTION: rash  . Shellfish Allergy  Nausea And Vomiting    Review of Systems  Review of Systems  Constitutional: Negative for fever, chills and malaise/fatigue.  HENT: Negative for congestion, hearing loss and nosebleeds.   Eyes: Negative for discharge.  Respiratory: Negative for cough, sputum production, shortness of breath and wheezing.   Cardiovascular: Negative for chest pain, palpitations and leg swelling.  Gastrointestinal: Negative for heartburn, nausea, vomiting, abdominal pain, diarrhea, constipation and blood in stool.  Genitourinary: Negative for dysuria, urgency, frequency and hematuria.  Musculoskeletal: Negative for back pain, falls and myalgias.  Skin: Negative for rash.  Neurological: Negative for dizziness, tremors, sensory change, focal weakness, loss of consciousness, weakness and headaches.  Endo/Heme/Allergies: Negative for polydipsia. Does not bruise/bleed easily.  Psychiatric/Behavioral: Negative for depression and suicidal ideas. The patient is not nervous/anxious and does not have insomnia.     Objective  BP 118/70  Pulse 55  Temp(Src) 98.7 F (37.1 C) (Oral)  Ht 5\' 2"  (1.575 m)  Wt 145 lb (65.772 kg)  BMI 26.51 kg/m2  SpO2 97%  Physical Exam  Physical Exam  Constitutional: She is oriented to person, place, and time and well-developed, well-nourished, and in no distress. No distress.  HENT:  Head: Normocephalic and atraumatic.  Right Ear: External ear normal.  Left Ear: External ear normal.  Nose: Nose normal.  Mouth/Throat: Oropharynx is clear and moist. No oropharyngeal exudate.  Eyes: Conjunctivae are normal. Pupils are equal, round, and reactive to light. Right eye exhibits no discharge. Left eye exhibits no discharge. No scleral icterus.  Neck: Normal range of motion. Neck supple. No thyromegaly present.  Cardiovascular: Normal rate, regular rhythm, normal heart sounds and intact distal pulses.   No murmur heard. Pulmonary/Chest: Effort normal and breath sounds normal. No  respiratory distress. She has no wheezes. She has no rales.  Abdominal: Soft. Bowel sounds are normal. She exhibits no distension and no mass. There is no tenderness.  Musculoskeletal: Normal range of motion. She exhibits no edema and no tenderness.  Lymphadenopathy:    She has no cervical adenopathy.  Neurological: She is alert and oriented to person, place, and time. She has normal reflexes. No cranial nerve deficit. Coordination normal.  Skin: Skin is warm and dry. No rash noted. She is not diaphoretic.  Psychiatric: Mood, memory and affect normal.    Lab Results  Component Value Date   TSH 4.065 07/26/2013   Lab Results  Component Value Date   WBC 8.1 07/26/2013   HGB 14.1 07/26/2013   HCT 41.7 07/26/2013   MCV 80.5 07/26/2013   PLT 191 07/26/2013   Lab Results  Component Value Date   CREATININE 0.87 07/26/2013   BUN 16 07/26/2013   NA 139 07/26/2013   K 3.7 07/26/2013   CL 98 07/26/2013   CO2 35* 07/26/2013   Lab Results  Component Value Date   ALT 12 07/26/2013   AST 16 07/26/2013   ALKPHOS 70 07/26/2013   BILITOT 0.5 07/26/2013   Lab Results  Component Value Date   CHOL 135 07/26/2013   Lab Results  Component Value Date   HDL 31* 07/26/2013   Lab Results  Component Value Date   LDLCALC 61 07/26/2013   Lab Results  Component Value Date   TRIG 214* 07/26/2013   Lab Results  Component Value Date   CHOLHDL 4.4 07/26/2013  Assessment & Plan  HYPERTENSION Well controlled, no changes  DEPRESSION Well controlled with Sertraline no changes  Medicare annual wellness visit, subsequent Patient doing well at home, no recent falls, depression or difficulty with ADLs. Patient has DNR in place and is encouraged to bring in a copy. She would request comfort measures only. No recent changes to hearing or vision. Eating well.

## 2013-07-31 NOTE — Assessment & Plan Note (Signed)
Well controlled, no changes 

## 2013-08-05 ENCOUNTER — Telehealth: Payer: Self-pay | Admitting: Family Medicine

## 2013-08-05 NOTE — Telephone Encounter (Signed)
Patient would like a call with her lab results

## 2013-08-08 NOTE — Telephone Encounter (Signed)
Pt informed per md: Labs look good, cholesterol much better. No changes just watch the carbs, saturated and trans fats. Dr Jacinto Reap

## 2013-08-23 ENCOUNTER — Emergency Department (HOSPITAL_COMMUNITY): Payer: Medicare HMO

## 2013-08-23 ENCOUNTER — Encounter (HOSPITAL_COMMUNITY): Payer: Self-pay | Admitting: Emergency Medicine

## 2013-08-23 ENCOUNTER — Emergency Department (HOSPITAL_COMMUNITY)
Admission: EM | Admit: 2013-08-23 | Discharge: 2013-08-23 | Disposition: A | Discharge status: Home / Self Care | Payer: Medicare HMO | Attending: Emergency Medicine | Admitting: Emergency Medicine

## 2013-08-23 DIAGNOSIS — J449 Chronic obstructive pulmonary disease, unspecified: Secondary | ICD-10-CM | POA: Insufficient documentation

## 2013-08-23 DIAGNOSIS — Z7982 Long term (current) use of aspirin: Secondary | ICD-10-CM | POA: Insufficient documentation

## 2013-08-23 DIAGNOSIS — I10 Essential (primary) hypertension: Secondary | ICD-10-CM | POA: Insufficient documentation

## 2013-08-23 DIAGNOSIS — Z87891 Personal history of nicotine dependence: Secondary | ICD-10-CM | POA: Insufficient documentation

## 2013-08-23 DIAGNOSIS — I209 Angina pectoris, unspecified: Secondary | ICD-10-CM | POA: Insufficient documentation

## 2013-08-23 DIAGNOSIS — E785 Hyperlipidemia, unspecified: Secondary | ICD-10-CM | POA: Insufficient documentation

## 2013-08-23 DIAGNOSIS — F3289 Other specified depressive episodes: Secondary | ICD-10-CM | POA: Insufficient documentation

## 2013-08-23 DIAGNOSIS — J4489 Other specified chronic obstructive pulmonary disease: Secondary | ICD-10-CM | POA: Insufficient documentation

## 2013-08-23 DIAGNOSIS — Z7902 Long term (current) use of antithrombotics/antiplatelets: Secondary | ICD-10-CM | POA: Insufficient documentation

## 2013-08-23 DIAGNOSIS — F329 Major depressive disorder, single episode, unspecified: Secondary | ICD-10-CM | POA: Insufficient documentation

## 2013-08-23 DIAGNOSIS — R1032 Left lower quadrant pain: Secondary | ICD-10-CM

## 2013-08-23 DIAGNOSIS — I2510 Atherosclerotic heart disease of native coronary artery without angina pectoris: Secondary | ICD-10-CM | POA: Insufficient documentation

## 2013-08-23 DIAGNOSIS — I739 Peripheral vascular disease, unspecified: Secondary | ICD-10-CM | POA: Insufficient documentation

## 2013-08-23 DIAGNOSIS — IMO0002 Reserved for concepts with insufficient information to code with codable children: Secondary | ICD-10-CM | POA: Insufficient documentation

## 2013-08-23 DIAGNOSIS — I252 Old myocardial infarction: Secondary | ICD-10-CM | POA: Insufficient documentation

## 2013-08-23 DIAGNOSIS — Z880 Allergy status to penicillin: Secondary | ICD-10-CM | POA: Insufficient documentation

## 2013-08-23 DIAGNOSIS — K219 Gastro-esophageal reflux disease without esophagitis: Secondary | ICD-10-CM | POA: Insufficient documentation

## 2013-08-23 DIAGNOSIS — M810 Age-related osteoporosis without current pathological fracture: Secondary | ICD-10-CM | POA: Insufficient documentation

## 2013-08-23 DIAGNOSIS — R109 Unspecified abdominal pain: Secondary | ICD-10-CM | POA: Insufficient documentation

## 2013-08-23 DIAGNOSIS — Z8673 Personal history of transient ischemic attack (TIA), and cerebral infarction without residual deficits: Secondary | ICD-10-CM | POA: Insufficient documentation

## 2013-08-23 DIAGNOSIS — Z951 Presence of aortocoronary bypass graft: Secondary | ICD-10-CM | POA: Insufficient documentation

## 2013-08-23 DIAGNOSIS — Z9861 Coronary angioplasty status: Secondary | ICD-10-CM | POA: Insufficient documentation

## 2013-08-23 LAB — BASIC METABOLIC PANEL
BUN: 17 mg/dL (ref 6–23)
CO2: 27 mEq/L (ref 19–32)
Calcium: 9.1 mg/dL (ref 8.4–10.5)
Chloride: 99 mEq/L (ref 96–112)
Creatinine, Ser: 0.77 mg/dL (ref 0.50–1.10)
GFR calc non Af Amer: 81 mL/min — ABNORMAL LOW (ref >90)
Glucose, Bld: 98 mg/dL (ref 70–99)
Potassium: 3.7 mEq/L (ref 3.7–5.3)
Sodium: 140 mEq/L (ref 137–147)

## 2013-08-23 LAB — URINE MICROSCOPIC-ADD ON

## 2013-08-23 LAB — CBC WITH DIFFERENTIAL/PLATELET
BASOS ABS: 0 10*3/uL (ref 0.0–0.1)
BASOS PCT: 0 % (ref 0–1)
Eosinophils Absolute: 0.1 10*3/uL (ref 0.0–0.7)
Eosinophils Relative: 1 % (ref 0–5)
HCT: 37.8 % (ref 36.0–46.0)
Hemoglobin: 12.7 g/dL (ref 12.0–15.0)
Lymphocytes Relative: 16 % (ref 12–46)
Lymphs Abs: 1.8 10*3/uL (ref 0.7–4.0)
MCH: 28 pg (ref 26.0–34.0)
MCHC: 33.6 g/dL (ref 30.0–36.0)
MCV: 83.3 fL (ref 78.0–100.0)
Monocytes Absolute: 0.8 10*3/uL (ref 0.1–1.0)
Monocytes Relative: 7 % (ref 3–12)
NEUTROS ABS: 8.3 10*3/uL — AB (ref 1.7–7.7)
NEUTROS PCT: 76 % (ref 43–77)
PLATELETS: 169 10*3/uL (ref 150–400)
RBC: 4.54 MIL/uL (ref 3.87–5.11)
RDW: 14.8 % (ref 11.5–15.5)
WBC: 11.1 10*3/uL — AB (ref 4.0–10.5)

## 2013-08-23 LAB — URINALYSIS, ROUTINE W REFLEX MICROSCOPIC
Bilirubin Urine: NEGATIVE
GLUCOSE, UA: NEGATIVE mg/dL
Ketones, ur: NEGATIVE mg/dL
Leukocytes, UA: NEGATIVE
NITRITE: NEGATIVE
PROTEIN: NEGATIVE mg/dL
Specific Gravity, Urine: 1.013 (ref 1.005–1.030)
UROBILINOGEN UA: 0.2 mg/dL (ref 0.0–1.0)
pH: 6.5 (ref 5.0–8.0)

## 2013-08-23 MED ORDER — HYDROMORPHONE HCL PF 1 MG/ML IJ SOLN
1.0000 mg | Freq: Once | INTRAMUSCULAR | Status: AC
  Administered 2013-08-23: 1 mg via INTRAVENOUS
  Filled 2013-08-23: qty 1

## 2013-08-23 MED ORDER — SODIUM CHLORIDE 0.9 % IV SOLN
INTRAVENOUS | Status: DC
  Administered 2013-08-23: 16:00:00 via INTRAVENOUS

## 2013-08-23 MED ORDER — ONDANSETRON HCL 4 MG/2ML IJ SOLN
4.0000 mg | Freq: Once | INTRAMUSCULAR | Status: AC
  Administered 2013-08-23: 4 mg via INTRAVENOUS
  Filled 2013-08-23: qty 2

## 2013-08-23 MED ORDER — LORAZEPAM 2 MG/ML IJ SOLN
0.5000 mg | Freq: Once | INTRAMUSCULAR | Status: AC
  Administered 2013-08-23: 0.5 mg via INTRAVENOUS
  Filled 2013-08-23: qty 1

## 2013-08-23 MED ORDER — NAPROXEN 375 MG PO TABS: 375.0000 mg | ORAL_TABLET | Freq: Two times a day (BID) | ORAL | Status: DC

## 2013-08-23 MED ORDER — IOHEXOL 350 MG/ML SOLN
100.0000 mL | Freq: Once | INTRAVENOUS | Status: AC | PRN
  Administered 2013-08-23: 100 mL via INTRAVENOUS

## 2013-08-23 MED ORDER — HYDROCODONE-ACETAMINOPHEN 5-325 MG PO TABS: 2.0000 | ORAL_TABLET | ORAL | Status: DC | PRN

## 2013-08-23 NOTE — Discharge Instructions (Signed)
Return here for fever, vomiting, swelling in your groin, or any other problems

## 2013-08-23 NOTE — ED Notes (Signed)
PT awaiting ride from husband at this time.

## 2013-08-23 NOTE — ED Notes (Signed)
Pt comfortable with discharge and follow up instructions. Prescriptions x2. 

## 2013-08-23 NOTE — ED Notes (Signed)
Reports sudden onset this am of left groin pain that radiates around to lower back. Denies injury. Denies urinary symptoms.

## 2013-08-23 NOTE — ED Notes (Signed)
Pt. SpO2 86%RA. Pt placed on 2L [place], SpO2 91%. PT A&Ox4

## 2013-08-23 NOTE — ED Provider Notes (Signed)
CSN: [id number]     Arrival date & time 08/23/13  1323 History   First MD Initiated Contact with Patient 08/23/13 1543     Chief Complaint  Patient presents with  . Groin Pain     (Consider location/radiation/quality/duration/timing/severity/associated sxs/prior Treatment) Patient is a 74 y.o. female presenting with groin pain. The history is provided by the patient.  Groin Pain   patient here complaining of acute onset of left-sided groin pain was radiating to her low back. Pain characterized as sharp and pinpoint at her mid inguinal ligament. Denies any swelling at that area. No dysuria or hematuria. No syncope or near-syncope. Pain characterized as sharp and worse with standing or movement. Pain better with rest. No prior history of same. No vomiting noted. No fever or chills. No medications used prior to arrival. Denies any numbness or tingling of her left leg. Denies any recent back injury. No prior history of kidney stones. Does have a history of an endovascular graft.  Past Medical History  Diagnosis Date  . COPD (chronic obstructive pulmonary disease)   . Cerebrovascular disease 01/2009    carotid u/s  R 0-39%   L 60-79%  . CAD (coronary artery disease)     s/CABG (reports IMA and 2 SVGs) back in 1999  Myoview normal 3/10; s/p PCI with DES to PL branch of distal RCA 09/2011  . Depression   . GERD (gastroesophageal reflux disease)   . Hypertension   . Hyperlipidemia   . Osteoporosis   . PVD (peripheral vascular disease)   . Dizziness   . AAA (abdominal aortic aneurysm) 01/2009    AAA (2.8 x 3.0)  moderate RAS (left); 2.7 x 2.7 cm (07/11/05)  . NSTEMI (non-ST elevated myocardial infarction) 11/12    Cath showed atretic IMA graft to the LAD, SVG to PD was patent but the continuation of this graft to the PL branch was occluded; there were L to R collaterals; Mid LAD had a 60 to 70% stenosis. She has been treated medically.  Neg Myoview 05/2011  . Bradycardia     Metoprolol stopped  08/2011  . Angina   . Dysrhythmia     hx of sinus brady  . Shortness of breath   . Blood transfusion     hx of transfusion   . Herniated lumbar disc without myelopathy   . Anosmia   . Acute bronchitis 03/20/2013  . Medicare annual wellness visit, subsequent 07/31/2013   Past Surgical History  Procedure Laterality Date  . Coronary artery bypass graft  1999  . Tubal ligation    . Abdominal aortic aneurysm repair    . Femoral artery stent    . Closed reduction nasal fracture  11/2007  . Carotid endarterectomy  01/02/11    left  . Coronary angioplasty with stent placement  10/10/2011    drug eluting  to rc & saphenous  . Nasal sinus surgery      twice   Family History  Problem Relation Age of Onset  . Heart attack Mother 28  . Diabetes Mother   . Colon cancer Father 21  . Lung cancer Father     smoked  . Heart disease Father     angina  . Hypertension Father   . Cancer Father     colon, lung  . Lung cancer Sister     smoked  . Cancer Sister   . Hypothyroidism Sister   . Hypertension Brother   . Heart attack Brother   .  Heart disease Brother     stent  . Heart attack Brother     CABG  . Heart disease Brother     CABG with 1 bypass  . Aneurysm Brother     brain  . Alcohol abuse Brother   . Hyperlipidemia Daughter   . Diabetes Maternal Grandmother   . Stroke Maternal Grandmother   . Cirrhosis Maternal Grandfather   . Stroke Paternal Grandmother   . Obesity Daughter   . Obesity Son    History  Substance Use Topics  . Smoking status: Former Smoker -- 0.50 packs/day for 40 years    Types: Cigarettes    Quit date: 01/19/1993  . Smokeless tobacco: Never Used  . Alcohol Use: No     Comment: rare   OB History   Grav Para Term Preterm Abortions TAB SAB Ect Mult Living                 Review of Systems  All other systems reviewed and are negative.      Allergies  Ciprofloxacin; Codeine; Erythromycin; Meperidine hcl; Penicillins; and Shellfish  allergy  Home Medications   Current Outpatient Rx  Name  Route  Sig  Dispense  Refill  . acetaminophen (TYLENOL) 650 MG CR tablet   Oral   Take 1,300 mg by mouth 2 (two) times daily.         Marland Kitchen albuterol (PROVENTIL HFA;VENTOLIN HFA) 108 (90 BASE) MCG/ACT inhaler   Inhalation   Inhale 2 puffs into the lungs every 6 (six) hours as needed for wheezing.   1 Inhaler   2   . aspirin 325 MG EC tablet   Oral   Take 325 mg by mouth daily.         . budesonide-formoterol (SYMBICORT) 160-4.5 MCG/ACT inhaler   Inhalation   Inhale 2 puffs into the lungs 2 (two) times daily.   1 Inhaler   11   . Calcium Carbonate-Vitamin D (CALCIUM + D PO)   Oral   Take 1 tablet by mouth 2 (two) times daily.         . clopidogrel (PLAVIX) 75 MG tablet   Oral   Take 75 mg by mouth daily with breakfast.         . diclofenac sodium (VOLTAREN) 1 % GEL   Topical   Apply 2 g topically 3 (three) times daily as needed (pain).         Marland Kitchen ezetimibe (ZETIA) 10 MG tablet   Oral   Take 1 tablet (10 mg total) by mouth daily.   30 tablet   2   . fluticasone (FLONASE) 50 MCG/ACT nasal spray   Nasal   Place 2 sprays into the nose 2 (two) times daily as needed. For congestion         . hydrochlorothiazide (HYDRODIURIL) 25 MG tablet   Oral   Take 25 mg by mouth every morning.         Marland Kitchen HYDROcodone-acetaminophen (NORCO/VICODIN) 5-325 MG per tablet   Oral   Take 1 tablet by mouth 2 (two) times daily as needed for pain.   30 tablet   1   . isosorbide mononitrate (IMDUR) 30 MG 24 hr tablet   Oral   Take 30 mg by mouth 2 (two) times daily.         Javier Docker Oil 1000 MG CAPS   Oral   Take 1,000 mg by mouth daily.         Marland Kitchen  losartan (COZAAR) 100 MG tablet   Oral   Take 100 mg by mouth daily.         . montelukast (SINGULAIR) 10 MG tablet   Oral   Take 10 mg by mouth at bedtime.         . nitroGLYCERIN (NITROSTAT) 0.4 MG SL tablet   Sublingual   Place 0.4 mg under the tongue every  5 (five) minutes as needed for chest pain.         . pantoprazole (PROTONIX) 40 MG tablet   Oral   Take 1 tablet (40 mg total) by mouth daily.   30 tablet   6   . Polyethyl Glycol-Propyl Glycol (SYSTANE OP)   Both Eyes   Place 1 drop into both eyes daily as needed (dry eyes).         . rosuvastatin (CRESTOR) 40 MG tablet   Oral   Take 40 mg by mouth daily.         . sertraline (ZOLOFT) 100 MG tablet   Oral   Take 100 mg by mouth daily.          BP 131/68  Pulse 64  Temp(Src) 99.3 F (37.4 C) (Oral)  Resp 12  Ht 5\' 2"  (1.575 m)  Wt 141 lb (63.957 kg)  BMI 25.78 kg/m2  SpO2 96% Physical Exam  Nursing note and vitals reviewed. Constitutional: She is oriented to person, place, and time. She appears well-developed and well-nourished.  Non-toxic appearance. No distress.  HENT:  Head: Normocephalic and atraumatic.  Eyes: Conjunctivae, EOM and lids are normal. Pupils are equal, round, and reactive to light.  Neck: Normal range of motion. Neck supple. No tracheal deviation present. No mass present.  Cardiovascular: Normal rate, regular rhythm and normal heart sounds.  Exam reveals no gallop.   No murmur heard. Pulmonary/Chest: Effort normal and breath sounds normal. No stridor. No respiratory distress. She has no decreased breath sounds. She has no wheezes. She has no rhonchi. She has no rales.  Abdominal: Soft. Normal appearance and bowel sounds are normal. She exhibits no distension. There is no tenderness. There is no rebound and no CVA tenderness.  Musculoskeletal: Normal range of motion. She exhibits no edema and no tenderness.       Legs: Femoral pulses 2+. She has pinpoint tenderness mid inguinal ligament on the left side. No obvious hernias palpated. Neurovascular intact distally  Neurological: She is alert and oriented to person, place, and time. She has normal strength. No cranial nerve deficit or sensory deficit. GCS eye subscore is 4. GCS verbal subscore is 5.  GCS motor subscore is 6.  Skin: Skin is warm and dry. No abrasion and no rash noted.  Psychiatric: She has a normal mood and affect. Her speech is normal and behavior is normal.    ED Course  Procedures (including critical care time) Labs Review Labs Reviewed  URINALYSIS, ROUTINE W REFLEX MICROSCOPIC - Abnormal; Notable for the following:    Hgb urine dipstick TRACE (*)    All other components within normal limits  CBC WITH DIFFERENTIAL - Abnormal; Notable for the following:    WBC 11.1 (*)    Neutro Abs 8.3 (*)    All other components within normal limits  BASIC METABOLIC PANEL - Abnormal; Notable for the following:    GFR calc non Af Amer 81 (*)    All other components within normal limits  URINE MICROSCOPIC-ADD ON   Imaging Review No results found.  EKG Interpretation None      MDM   Final diagnoses:  None   Patient given pain meds and she feels better. She has no evidence of femoral hernia on the exam or on CT. No evidence of vascular involvement. No aneurysm noted. Patient to be given pain meds and she is to for discharge.     Leota Jacobsen, MD 08/23/13 [phone]

## 2013-08-23 NOTE — ED Notes (Signed)
Dr Allen at bedside  

## 2013-09-06 ENCOUNTER — Other Ambulatory Visit: Payer: Self-pay | Admitting: Family Medicine

## 2013-09-07 ENCOUNTER — Encounter: Payer: Self-pay | Admitting: Internal Medicine

## 2013-09-14 ENCOUNTER — Other Ambulatory Visit: Payer: Self-pay

## 2013-09-14 MED ORDER — ALBUTEROL SULFATE HFA 108 (90 BASE) MCG/ACT IN AERS: 2.0000 | INHALATION_SPRAY | Freq: Four times a day (QID) | RESPIRATORY_TRACT | Status: DC | PRN

## 2013-09-19 ENCOUNTER — Other Ambulatory Visit: Payer: Self-pay | Admitting: Cardiovascular Disease

## 2013-09-22 ENCOUNTER — Other Ambulatory Visit: Payer: Self-pay

## 2013-09-22 DIAGNOSIS — J441 Chronic obstructive pulmonary disease with (acute) exacerbation: Secondary | ICD-10-CM

## 2013-09-22 MED ORDER — BUDESONIDE-FORMOTEROL FUMARATE 160-4.5 MCG/ACT IN AERO: 2.0000 | INHALATION_SPRAY | Freq: Two times a day (BID) | RESPIRATORY_TRACT | Status: DC

## 2013-10-11 ENCOUNTER — Other Ambulatory Visit: Payer: Self-pay | Admitting: Family Medicine

## 2013-10-13 ENCOUNTER — Other Ambulatory Visit: Payer: Self-pay

## 2013-10-13 ENCOUNTER — Telehealth: Payer: Self-pay | Admitting: Family Medicine

## 2013-10-13 MED ORDER — LOSARTAN POTASSIUM 100 MG PO TABS: 100.0000 mg | ORAL_TABLET | Freq: Every day | ORAL | Status: DC

## 2013-10-13 NOTE — Telephone Encounter (Signed)
Refill losartin potassium 100 mg tab qty 30 take 1 tablet by mouth every day  [facility]

## 2013-11-17 ENCOUNTER — Other Ambulatory Visit: Payer: Self-pay | Admitting: Family Medicine

## 2013-11-28 ENCOUNTER — Telehealth: Payer: Self-pay | Admitting: Family Medicine

## 2013-11-28 MED ORDER — PANTOPRAZOLE SODIUM 40 MG PO TBEC: 40.0000 mg | DELAYED_RELEASE_TABLET | Freq: Every day | ORAL | Status: DC

## 2013-11-28 NOTE — Telephone Encounter (Signed)
Requesting refill on pantoprazole (PROTONIX) 40 MG tablet, please call into [facility] [phone]

## 2013-12-09 ENCOUNTER — Telehealth: Payer: Self-pay | Admitting: Family Medicine

## 2013-12-09 DIAGNOSIS — E785 Hyperlipidemia, unspecified: Secondary | ICD-10-CM

## 2013-12-09 MED ORDER — ROSUVASTATIN CALCIUM 40 MG PO TABS: 40.0000 mg | ORAL_TABLET | Freq: Every day | ORAL | Status: DC

## 2013-12-09 NOTE — Telephone Encounter (Signed)
Refill- crest[facility]  [facility] [id number] in [place]

## 2013-12-09 NOTE — Telephone Encounter (Signed)
Answering as Dr. Charlett Blake is out of office.  Refill granted, quantity 30 with 2 refills.

## 2014-01-04 ENCOUNTER — Telehealth: Payer: Self-pay | Admitting: Family Medicine

## 2014-01-04 ENCOUNTER — Other Ambulatory Visit: Payer: Self-pay | Admitting: Family Medicine

## 2014-01-04 DIAGNOSIS — M25562 Pain in left knee: Secondary | ICD-10-CM

## 2014-01-04 NOTE — Telephone Encounter (Signed)
Requesting referral to orth dr to have liquid drained from left knee

## 2014-01-05 ENCOUNTER — Other Ambulatory Visit: Payer: Self-pay | Admitting: Cardiovascular Disease

## 2014-01-09 ENCOUNTER — Telehealth: Payer: Self-pay

## 2014-01-09 NOTE — Telephone Encounter (Signed)
So where is the swelling front or back if in the back and painful we may need to see her to rule out a blood clot otherwise she could see ortho

## 2014-01-09 NOTE — Telephone Encounter (Signed)
Patient left a message stating that she has an appt with ortho on 01-17-14 but is needing a referral? Looks like one was done?  Pt also stated that she is having some unusual swelling in her leg underneath where knee is? The same leg vein was taken out of?  Pt is wandering if she should see Dr Charlett Blake for the swelling before her ortho appt or wait until she sees them?  Please advise?

## 2014-01-09 NOTE — Telephone Encounter (Signed)
Left a detailed message for patient to return our call

## 2014-01-10 NOTE — Telephone Encounter (Signed)
Called pt, no answer. Left message for pt to return my call. LDM

## 2014-01-14 ENCOUNTER — Other Ambulatory Visit: Payer: Self-pay | Admitting: Cardiovascular Disease

## 2014-01-16 NOTE — Telephone Encounter (Signed)
Called pt, no answer. Left message for pt to return

## 2014-01-24 ENCOUNTER — Encounter: Payer: Self-pay | Admitting: Family Medicine

## 2014-01-24 ENCOUNTER — Ambulatory Visit (INDEPENDENT_AMBULATORY_CARE_PROVIDER_SITE_OTHER): Payer: Commercial Managed Care - HMO | Admitting: Family Medicine

## 2014-01-24 VITALS — BP 158/90 | HR 62 | Temp 97.8°F | Ht 62.0 in | Wt 145.0 lb

## 2014-01-24 DIAGNOSIS — R059 Cough, unspecified: Secondary | ICD-10-CM

## 2014-01-24 DIAGNOSIS — L738 Other specified follicular disorders: Secondary | ICD-10-CM

## 2014-01-24 DIAGNOSIS — L739 Follicular disorder, unspecified: Secondary | ICD-10-CM

## 2014-01-24 DIAGNOSIS — R05 Cough: Secondary | ICD-10-CM

## 2014-01-24 DIAGNOSIS — L678 Other hair color and hair shaft abnormalities: Secondary | ICD-10-CM

## 2014-01-24 DIAGNOSIS — F329 Major depressive disorder, single episode, unspecified: Secondary | ICD-10-CM

## 2014-01-24 DIAGNOSIS — Z23 Encounter for immunization: Secondary | ICD-10-CM

## 2014-01-24 DIAGNOSIS — M7120 Synovial cyst of popliteal space [Baker], unspecified knee: Secondary | ICD-10-CM

## 2014-01-24 DIAGNOSIS — K219 Gastro-esophageal reflux disease without esophagitis: Secondary | ICD-10-CM

## 2014-01-24 DIAGNOSIS — F3289 Other specified depressive episodes: Secondary | ICD-10-CM

## 2014-01-24 DIAGNOSIS — J449 Chronic obstructive pulmonary disease, unspecified: Secondary | ICD-10-CM

## 2014-01-24 DIAGNOSIS — I519 Heart disease, unspecified: Secondary | ICD-10-CM

## 2014-01-24 DIAGNOSIS — M546 Pain in thoracic spine: Secondary | ICD-10-CM | POA: Insufficient documentation

## 2014-01-24 DIAGNOSIS — I10 Essential (primary) hypertension: Secondary | ICD-10-CM

## 2014-01-24 DIAGNOSIS — I719 Aortic aneurysm of unspecified site, without rupture: Secondary | ICD-10-CM

## 2014-01-24 DIAGNOSIS — M7122 Synovial cyst of popliteal space [Baker], left knee: Secondary | ICD-10-CM

## 2014-01-24 DIAGNOSIS — I25810 Atherosclerosis of coronary artery bypass graft(s) without angina pectoris: Secondary | ICD-10-CM

## 2014-01-24 DIAGNOSIS — E785 Hyperlipidemia, unspecified: Secondary | ICD-10-CM

## 2014-01-24 LAB — LIPID PANEL
CHOLESTEROL: 136 mg/dL (ref 0–200)
HDL: 34.5 mg/dL — ABNORMAL LOW (ref >39.00)
LDL CALC: 75 mg/dL (ref 0–99)
NonHDL: 101.5
Total CHOL/HDL Ratio: 4
Triglycerides: 134 mg/dL (ref 0.0–149.0)
VLDL: 26.8 mg/dL (ref 0.0–40.0)

## 2014-01-24 LAB — RENAL FUNCTION PANEL
ALBUMIN: 4 g/dL (ref 3.5–5.2)
BUN: 19 mg/dL (ref 6–23)
CALCIUM: 9.5 mg/dL (ref 8.4–10.5)
CO2: 31 mEq/L (ref 19–32)
CREATININE: 0.9 mg/dL (ref 0.4–1.2)
Chloride: 102 mEq/L (ref 96–112)
GFR: 69.33 mL/min (ref >60.00)
Glucose, Bld: 94 mg/dL (ref 70–99)
PHOSPHORUS: 3.4 mg/dL (ref 2.3–4.6)
POTASSIUM: 3.7 meq/L (ref 3.5–5.1)
SODIUM: 140 meq/L (ref 135–145)

## 2014-01-24 LAB — HEPATIC FUNCTION PANEL
ALT: 13 U/L (ref 0–35)
AST: 15 U/L (ref 0–37)
Albumin: 4 g/dL (ref 3.5–5.2)
Alkaline Phosphatase: 73 U/L (ref 39–117)
BILIRUBIN DIRECT: 0 mg/dL (ref 0.0–0.3)
BILIRUBIN TOTAL: 0.5 mg/dL (ref 0.2–1.2)
Total Protein: 7.4 g/dL (ref 6.0–8.3)

## 2014-01-24 LAB — CBC
HEMATOCRIT: 42.6 % (ref 36.0–46.0)
Hemoglobin: 13.8 g/dL (ref 12.0–15.0)
MCHC: 32.4 g/dL (ref 30.0–36.0)
MCV: 81.8 fl (ref 78.0–100.0)
PLATELETS: 215 10*3/uL (ref 150.0–400.0)
RBC: 5.21 Mil/uL — ABNORMAL HIGH (ref 3.87–5.11)
RDW: 17.5 % — ABNORMAL HIGH (ref 11.5–15.5)
WBC: 8.4 10*3/uL (ref 4.0–10.5)

## 2014-01-24 LAB — TSH: TSH: 3.92 u[IU]/mL (ref 0.35–4.50)

## 2014-01-24 MED ORDER — SERTRALINE HCL 50 MG PO TABS: 50.0000 mg | ORAL_TABLET | Freq: Every day | ORAL | Status: DC

## 2014-01-24 MED ORDER — ISOSORBIDE MONONITRATE ER 30 MG PO TB24
30.0000 mg | ORAL_TABLET | Freq: Every day | ORAL | Status: DC
Start: 2014-01-24 — End: 2014-02-09

## 2014-01-24 MED ORDER — PANTOPRAZOLE SODIUM 40 MG PO TBEC: 40.0000 mg | DELAYED_RELEASE_TABLET | Freq: Every day | ORAL | Status: DC

## 2014-01-24 MED ORDER — MUPIROCIN 2 % EX OINT: 1.0000 "application " | TOPICAL_OINTMENT | Freq: Every day | CUTANEOUS | Status: DC

## 2014-01-24 MED ORDER — PNEUMOCOCCAL 13-VAL CONJ VACC IM SUSP: 0.5000 mL | Freq: Once | INTRAMUSCULAR | Status: DC

## 2014-01-24 NOTE — Assessment & Plan Note (Addendum)
Doing well on Sertraline but would like to start titrating med and see how she does, is given rx for 50 mg daily for now and she will call with any concerns

## 2014-01-24 NOTE — Progress Notes (Signed)
Pre visit review using our clinic review tool, if applicable. No additional management support is needed unless otherwise documented below in the visit note. 

## 2014-01-24 NOTE — Patient Instructions (Signed)

## 2014-01-24 NOTE — Assessment & Plan Note (Signed)
Tolerating statin, encouraged heart healthy diet, avoid trans fats, minimize simple carbs and saturated fats. Increase exercise as tolerated 

## 2014-01-25 NOTE — Telephone Encounter (Signed)
Patient informed and has already seen ortho and saw Dr B yesterday

## 2014-01-30 ENCOUNTER — Encounter: Payer: Self-pay | Admitting: Family Medicine

## 2014-01-30 DIAGNOSIS — L739 Follicular disorder, unspecified: Secondary | ICD-10-CM | POA: Insufficient documentation

## 2014-01-30 DIAGNOSIS — M7120 Synovial cyst of popliteal space [Baker], unspecified knee: Secondary | ICD-10-CM

## 2014-01-30 HISTORY — DX: Synovial cyst of popliteal space (Baker), unspecified knee: M71.20

## 2014-01-30 NOTE — Progress Notes (Signed)
[name], female   DOB: 1940/06/19, 73 y.o.   MRN: [id number] Andrea [name] [id number] [birth year] 01/30/2014      Progress Note-Follow Up  Subjective  Chief Complaint  Chief Complaint  Patient presents with  . Follow-up    6 month  . Injections    tdap and prevnar    HPI  Patient is a 73 year old female in today for routine medical care. Has numerous complaints. Seen in the emergency room in July and diagnosed with a left-sided Baker's cyst. Responded to drainage and is now following with [facility] orthopedics and notes that steroids and bracing have been somewhat helpful. No acute illness but does continue to struggle with intermittent cough and occasional short of breath although she needs albuterol infrequently. She has some trouble with back pain most notably in the thoracic region presently right greater than left. No radicular symptoms or recent trauma. Denies CP/palp/SOB/HA/congestion/fevers/GI or GU c/o. Taking meds as prescribed  Past Medical History  Diagnosis Date  . COPD (chronic obstructive pulmonary disease)   . Cerebrovascular disease 01/2009    carotid u/s  R 0-39%   L 60-79%  . CAD (coronary artery disease)     s/CABG (reports IMA and 2 SVGs) back in 1999  Myoview normal 3/10; s/p PCI with DES to PL branch of distal RCA 09/2011  . Depression   . GERD (gastroesophageal reflux disease)   . Hypertension   . Hyperlipidemia   . Osteoporosis   . PVD (peripheral vascular disease)   . Dizziness   . AAA (abdominal aortic aneurysm) 01/2009    AAA (2.8 x 3.0)  moderate RAS (left); 2.7 x 2.7 cm (07/11/05)  . NSTEMI (non-ST elevated myocardial infarction) 11/12    Cath showed atretic IMA graft to the LAD, SVG to PD was patent but the continuation of this graft to the PL branch was occluded; there were L to R collaterals; Mid LAD had a 60 to 70% stenosis. She has been treated medically.  Neg Myoview 05/2011  . Bradycardia     Metoprolol stopped 08/2011  . Angina    . Dysrhythmia     hx of sinus brady  . Shortness of breath   . Blood transfusion     hx of transfusion   . Herniated lumbar disc without myelopathy   . Anosmia   . Acute bronchitis 03/20/2013  . Medicare annual wellness visit, subsequent 07/31/2013  . Thoracic back pain 01/24/2014    Past Surgical History  Procedure Laterality Date  . Coronary artery bypass graft  1999  . Tubal ligation    . Abdominal aortic aneurysm repair    . Femoral artery stent    . Closed reduction nasal fracture  11/2007  . Carotid endarterectomy  01/02/11    left  . Coronary angioplasty with stent placement  10/10/2011    drug eluting  to rc & saphenous  . Nasal sinus surgery      twice    Family History  Problem Relation Age of Onset  . Heart attack Mother 79  . Diabetes Mother   . Colon cancer Father 68  . Lung cancer Father     smoked  . Heart disease Father     angina  . Hypertension Father   . Cancer Father     colon, lung  . Lung cancer Sister     smoked  . Cancer Sister   . Hypothyroidism Sister   . Hypertension Brother   .  Heart attack Brother   . Heart disease Brother     stent  . Heart attack Brother     CABG  . Heart disease Brother     CABG with 1 bypass  . Aneurysm Brother     brain  . Alcohol abuse Brother   . Hyperlipidemia Daughter   . Diabetes Maternal Grandmother   . Stroke Maternal Grandmother   . Cirrhosis Maternal Grandfather   . Stroke Paternal Grandmother   . Obesity Daughter   . Obesity Son     History   Social History  . Marital Status: Married    Spouse Name: N/A    Number of Children: 60  . Years of Education: N/A   Occupational History  . Retired     former  Marine scientist   Social History Main Topics  . Smoking status: Former Smoker -- 0.50 packs/day for 40 years    Types: Cigarettes    Quit date: 01/19/1993  . Smokeless tobacco: Never Used  . Alcohol Use: No     Comment: rare  . Drug Use: No  . Sexual Activity: No   Other Topics Concern  . Not  on file   Social History Narrative   Married with 5 children    Current Outpatient Prescriptions on File Prior to Visit  Medication Sig Dispense Refill  . acetaminophen (TYLENOL) 650 MG CR tablet Take 1,300 mg by mouth 2 (two) times daily.      Marland Kitchen albuterol (PROVENTIL HFA;VENTOLIN HFA) 108 (90 BASE) MCG/ACT inhaler Inhale 2 puffs into the lungs every 6 (six) hours as needed for wheezing.  1 Inhaler  2  . aspirin 325 MG EC tablet Take 325 mg by mouth daily.      . budesonide-formoterol (SYMBICORT) 160-4.5 MCG/ACT inhaler Inhale 2 puffs into the lungs 2 (two) times daily.  1 Inhaler  2  . Calcium Carbonate-Vitamin D (CALCIUM + D PO) Take 1 tablet by mouth 2 (two) times daily.      . clopidogrel (PLAVIX) 75 MG tablet TAKE 1 TABLET BY MOUTH EVERY DAY WITH BREAKFAST  30 tablet  3  . diclofenac sodium (VOLTAREN) 1 % GEL Apply 2 g topically 3 (three) times daily as needed (pain).      . fluticasone (FLONASE) 50 MCG/ACT nasal spray Place 2 sprays into the nose 2 (two) times daily as needed. For congestion      . hydrochlorothiazide (HYDRODIURIL) 25 MG tablet Take 25 mg by mouth every morning.      Marland Kitchen HYDROcodone-acetaminophen (NORCO/VICODIN) 5-325 MG per tablet Take 1 tablet by mouth 2 (two) times daily as needed for pain.  30 tablet  1  . Krill Oil 1000 MG CAPS Take 1,000 mg by mouth daily.      Marland Kitchen losartan (COZAAR) 100 MG tablet Take 1 tablet (100 mg total) by mouth daily.  30 tablet  3  . montelukast (SINGULAIR) 10 MG tablet TAKE 1 TABLET AT BEDTIME  30 tablet  1  . naproxen (NAPROSYN) 375 MG tablet Take 1 tablet (375 mg total) by mouth 2 (two) times daily.  20 tablet  0  . nitroGLYCERIN (NITROSTAT) 0.4 MG SL tablet Place 0.4 mg under the tongue every 5 (five) minutes as needed for chest pain.      Vladimir Faster Glycol-Propyl Glycol (SYSTANE OP) Place 1 drop into both eyes daily as needed (dry eyes).      . rosuvastatin (CRESTOR) 40 MG tablet Take 1 tablet (40 mg total) by mouth  daily.  30 tablet  2    No current facility-administered medications on file prior to visit.    Allergies  Allergen Reactions  . Ciprofloxacin     REACTION: rash IV  . Codeine     REACTION: nausea/vomiting  . Erythromycin     REACTION: tongue burns  . Meperidine Hcl     REACTION: Nausea/vomiting  . Penicillins     REACTION: rash  . Shellfish Allergy Nausea And Vomiting    Review of Systems  Review of Systems  Constitutional: Negative for fever and malaise/fatigue.  HENT: Negative for congestion.   Eyes: Negative for discharge.  Respiratory: Positive for cough. Negative for hemoptysis, sputum production and shortness of breath.   Cardiovascular: Negative for chest pain, palpitations and leg swelling.  Gastrointestinal: Positive for heartburn. Negative for nausea, abdominal pain and diarrhea.  Genitourinary: Negative for dysuria.  Musculoskeletal: Positive for joint pain. Negative for falls.       Left knee pain diagnosed with Baker's Cyst when seen in ED in [facility] in July, swelling and pain suddenly increased and pain improved with draining. Is following now with [place] ortho  Skin: Negative for rash.  Neurological: Negative for loss of consciousness and headaches.  Endo/Heme/Allergies: Negative for polydipsia.  Psychiatric/Behavioral: Negative for depression and suicidal ideas. The patient is not nervous/anxious and does not have insomnia.     Objective  BP 158/90  Pulse 62  Temp(Src) 97.8 F (36.6 C) (Tympanic)  Ht 5\' 2"  (1.575 m)  Wt 145 lb (65.772 kg)  BMI 26.51 kg/m2  SpO2 97%  Physical Exam  Physical Exam  Constitutional: She is oriented to person, place, and time and well-developed, well-nourished, and in no distress. No distress.  HENT:  Head: Normocephalic and atraumatic.  Eyes: Conjunctivae are normal.  Neck: Neck supple. No thyromegaly present.  Cardiovascular: Normal rate, regular rhythm and normal heart sounds.   No murmur heard. Pulmonary/Chest: Effort normal and breath  sounds normal. She has no wheezes.  Abdominal: She exhibits no distension and no mass.  Musculoskeletal: She exhibits no edema.  Lymphadenopathy:    She has no cervical adenopathy.  Neurological: She is alert and oriented to person, place, and time.  Skin: Skin is warm and dry. No rash noted. She is not diaphoretic.  Psychiatric: Memory, affect and judgment normal.    Lab Results  Component Value Date   TSH 3.92 01/24/2014   Lab Results  Component Value Date   WBC 8.4 01/24/2014   HGB 13.8 01/24/2014   HCT 42.6 01/24/2014   MCV 81.8 01/24/2014   PLT 215.0 01/24/2014   Lab Results  Component Value Date   CREATININE 0.9 01/24/2014   BUN 19 01/24/2014   NA 140 01/24/2014   K 3.7 01/24/2014   CL 102 01/24/2014   CO2 31 01/24/2014   Lab Results  Component Value Date   ALT 13 01/24/2014   AST 15 01/24/2014   ALKPHOS 73 01/24/2014   BILITOT 0.5 01/24/2014   Lab Results  Component Value Date   CHOL 136 01/24/2014   Lab Results  Component Value Date   HDL 34.50* 01/24/2014   Lab Results  Component Value Date   LDLCALC 75 01/24/2014   Lab Results  Component Value Date   TRIG 134.0 01/24/2014   Lab Results  Component Value Date   CHOLHDL 4 01/24/2014     Assessment & Plan  HYPERLIPIDEMIA Tolerating statin, encouraged heart healthy diet, avoid trans fats, minimize simple carbs and saturated  fats. Increase exercise as tolerated  DEPRESSION Doing well on Sertraline but would like to start titrating med and see how she does, is given rx for 50 mg daily for now and she will call with any concerns  HYPERTENSION Has been out of her Imdur and reports good control at home when she takes all meds.no changes today  COPD GOLD II Stable on Symbicort 2 puffs po bid  Thoracic back pain Try moist heat and topical treatments such as Salon Pas patches. Report worsening symptoms  Folliculitis Mupirocin prn, report worsening symptoms  Baker's cyst of knee Diagnosed July 2015 and drained in [facility]. Now following  with [place] ortho and responding to steroids and bracing

## 2014-01-30 NOTE — Assessment & Plan Note (Signed)
Diagnosed July 2015 and drained in [facility]. Now following with [place] ortho and responding to steroids and bracing

## 2014-01-30 NOTE — Assessment & Plan Note (Signed)
Try moist heat and topical treatments such as Salon Pas patches. Report worsening symptoms

## 2014-01-30 NOTE — Assessment & Plan Note (Signed)
Stable on Symbicort 2 puffs po bid

## 2014-01-30 NOTE — Assessment & Plan Note (Signed)
Mupirocin prn, report worsening symptoms

## 2014-01-30 NOTE — Assessment & Plan Note (Signed)
Has been out of her Imdur and reports good control at home when she takes all meds.no changes today

## 2014-01-31 ENCOUNTER — Other Ambulatory Visit: Payer: Self-pay | Admitting: Family Medicine

## 2014-01-31 ENCOUNTER — Other Ambulatory Visit: Payer: Self-pay | Admitting: Cardiovascular Disease

## 2014-01-31 NOTE — Telephone Encounter (Signed)
Patient needs a follow up appointment.

## 2014-01-31 NOTE — Telephone Encounter (Signed)
LMOM to have patient make a follow up appt.

## 2014-02-09 ENCOUNTER — Ambulatory Visit (INDEPENDENT_AMBULATORY_CARE_PROVIDER_SITE_OTHER): Payer: Commercial Managed Care - HMO | Admitting: Cardiovascular Disease

## 2014-02-09 ENCOUNTER — Ambulatory Visit: Payer: Commercial Managed Care - HMO | Admitting: Cardiovascular Disease

## 2014-02-09 ENCOUNTER — Encounter: Payer: Self-pay | Admitting: Cardiovascular Disease

## 2014-02-09 VITALS — BP 132/84 | HR 54 | Ht 62.0 in | Wt 145.2 lb

## 2014-02-09 DIAGNOSIS — J449 Chronic obstructive pulmonary disease, unspecified: Secondary | ICD-10-CM

## 2014-02-09 DIAGNOSIS — E785 Hyperlipidemia, unspecified: Secondary | ICD-10-CM

## 2014-02-09 DIAGNOSIS — I519 Heart disease, unspecified: Secondary | ICD-10-CM

## 2014-02-09 DIAGNOSIS — I10 Essential (primary) hypertension: Secondary | ICD-10-CM

## 2014-02-09 DIAGNOSIS — R0602 Shortness of breath: Secondary | ICD-10-CM | POA: Diagnosis not present

## 2014-02-09 DIAGNOSIS — Z9889 Other specified postprocedural states: Secondary | ICD-10-CM

## 2014-02-09 DIAGNOSIS — I214 Non-ST elevation (NSTEMI) myocardial infarction: Secondary | ICD-10-CM | POA: Diagnosis not present

## 2014-02-09 DIAGNOSIS — I6529 Occlusion and stenosis of unspecified carotid artery: Secondary | ICD-10-CM

## 2014-02-09 DIAGNOSIS — I25810 Atherosclerosis of coronary artery bypass graft(s) without angina pectoris: Secondary | ICD-10-CM

## 2014-02-09 MED ORDER — CLOPIDOGREL BISULFATE 75 MG PO TABS: 75.0000 mg | ORAL_TABLET | Freq: Every day | ORAL | Status: DC

## 2014-02-09 MED ORDER — LOSARTAN POTASSIUM 100 MG PO TABS: 100.0000 mg | ORAL_TABLET | Freq: Every day | ORAL | Status: DC

## 2014-02-09 MED ORDER — ISOSORBIDE MONONITRATE ER 30 MG PO TB24: 30.0000 mg | ORAL_TABLET | Freq: Every day | ORAL | Status: DC

## 2014-02-09 MED ORDER — NITROGLYCERIN 0.4 MG SL SUBL: 0.4000 mg | SUBLINGUAL_TABLET | SUBLINGUAL | Status: DC | PRN

## 2014-02-09 MED ORDER — ROSUVASTATIN CALCIUM 40 MG PO TABS: 40.0000 mg | ORAL_TABLET | Freq: Every day | ORAL | Status: DC

## 2014-02-09 NOTE — Assessment & Plan Note (Signed)
Cholesterol is at goal on the current lipid regimen. No changes to the medications were made.  

## 2014-02-09 NOTE — Assessment & Plan Note (Signed)
She is requesting a repeat carotid ultrasound. 2 years ago this was 40-59%. We'll continue aggressive cholesterol management

## 2014-02-09 NOTE — Progress Notes (Signed)
[name], female    DOB: 1940-12-04, 73 y.o.   MRN: [id number]  HPI Comments: Andrea [name] is a pleasant 73 year old woman with history of chronic bradycardia,  spinal stenosis, peripheral vascular disease, status post CEA on the left , followed in [place] , 40-59% bilateral carotid arterial disease in December 2013,  hyperlipidemia, significantly improved on Crestor, coronary artery disease and bypass surgery in 1999, stent placement to distal RCA may 2013, who presents for routine followup.  Overall she states that she is doing well. She is tolerating her Crestor 40 mg daily. On this her cholesterol has significantly improved now down to within a excellent range. Diet has been better. She does not take zetia. chronic mild shortness of breath with exertion. Chronic back pain She does have occasional chest twinges on the right side sometimes lasting for up to 30-45 seconds. This has been going on for years and does not seem to correlate with her cardiac catheterization report or stenoses. Symptoms present at rest, not with exertion  No PND or orthopnea. Otherwise she feels well with no complaints.  EKG shows normal sinus rhythm with rate 54 beats per minute with T wave inversions throughout the anterior and anterolateral  leads   Past Medical History   Diagnosis  Date   .  COPD (chronic obstructive pulmonary disease)     .  Cerebrovascular disease  01/2009       carotid u/s  R 0-39%   L 60-79%   .  CAD (coronary artery disease)         s/CABG (reports IMA and 2 SVGs) back in 1999  Myoview normal 3/10; s/p PCI with DES to PL branch of distal RCA 09/2011   .  Depression     .  GERD (gastroesophageal reflux disease)     .  Hypertension     .  Hyperlipidemia     .  Osteoporosis     .  PVD (peripheral vascular disease)     .  Dizziness     .  AAA (abdominal aortic aneurysm)  01/2009       AAA (2.8 x 3.0)  moderate RAS (left); 2.7 x 2.7 cm (07/11/05)   .  NSTEMI (non-ST  elevated myocardial infarction)  11/12       Cath showed atretic IMA graft to the LAD, SVG to PD was patent but the continuation of this graft to the PL branch was occluded; there were L to R collaterals; Mid LAD had a 60 to 70% stenosis. She has been treated medically.  Neg Myoview 05/2011   .  Bradycardia         Metoprolol stopped 08/2011   .  Angina     .  Dysrhythmia         hx of sinus brady   .  Shortness of breath     .  Blood transfusion         hx of transfusion    .  Herniated lumbar disc without myelopathy       Past Surgical History   Procedure  Laterality  Date   .  Coronary artery bypass graft    1999   .  Tubal ligation           .  Femoral artery stent       .  Closed reduction nasal fracture    11/2007   .  Carotid endarterectomy    01/02/11  left   .  Coronary angioplasty with stent placement    10/10/2011       drug eluting  to rc & saphenous     Outpatient Encounter Prescriptions as of 02/09/2014  Medication Sig  . acetaminophen (TYLENOL) 650 MG CR tablet Take 1,300 mg by mouth 2 (two) times daily.  Marland Kitchen albuterol (PROVENTIL HFA;VENTOLIN HFA) 108 (90 BASE) MCG/ACT inhaler Inhale 2 puffs into the lungs every 6 (six) hours as needed for wheezing.  Marland Kitchen aspirin 325 MG EC tablet Take 325 mg by mouth daily.  . budesonide-formoterol (SYMBICORT) 160-4.5 MCG/ACT inhaler Inhale 2 puffs into the lungs 2 (two) times daily.  . Calcium Carbonate-Vitamin D (CALCIUM + D PO) Take 1 tablet by mouth 2 (two) times daily.  . clopidogrel (PLAVIX) 75 MG tablet TAKE 1 TABLET BY MOUTH EVERY DAY WITH BREAKFAST  . diclofenac sodium (VOLTAREN) 1 % GEL Apply 2 g topically 3 (three) times daily as needed (pain).  . fluticasone (FLONASE) 50 MCG/ACT nasal spray Place 2 sprays into the nose 2 (two) times daily as needed. For congestion  . hydrochlorothiazide (HYDRODIURIL) 25 MG tablet Take 25 mg by mouth every morning.  Marland Kitchen HYDROcodone-acetaminophen (NORCO/VICODIN) 5-325 MG per tablet Take 1 tablet by  mouth 2 (two) times daily as needed for pain.  . isosorbide mononitrate (IMDUR) 30 MG 24 hr tablet Take 1 tablet (30 mg total) by mouth daily.  Andrea [name] Oil 1000 MG CAPS Take 1,000 mg by mouth daily.  Marland Kitchen losartan (COZAAR) 100 MG tablet Take 1 tablet (100 mg total) by mouth daily.  . montelukast (SINGULAIR) 10 MG tablet TAKE 1 TABLET BY MOUTH AT BEDTIME  . mupirocin ointment (BACTROBAN) 2 % Place 1 application into the nose daily. Via qtip to ear daily  . nitroGLYCERIN (NITROSTAT) 0.4 MG SL tablet Place 0.4 mg under the tongue every 5 (five) minutes as needed for chest pain.  . pantoprazole (PROTONIX) 40 MG tablet Take 1 tablet (40 mg total) by mouth daily.  Andrea [name] Glycol-Propyl Glycol (SYSTANE OP) Place 1 drop into both eyes daily as needed (dry eyes).  . rosuvastatin (CRESTOR) 40 MG tablet Take 1 tablet (40 mg total) by mouth daily.  . sertraline (ZOLOFT) 50 MG tablet Take 1 tablet (50 mg total) by mouth daily.    Review of Systems  Constitutional: Negative.   HENT: Negative.   Eyes: Negative.   Respiratory: Negative.   Cardiovascular: Positive for chest pain.  Gastrointestinal: Negative.   Endocrine: Negative.   Musculoskeletal: Positive for back pain.  Skin: Negative.   Allergic/Immunologic: Negative.   Neurological: Negative.   Hematological: Negative.   Psychiatric/Behavioral: Negative.   All other systems reviewed and are negative.   BP 132/84  Pulse 54  Ht 5\' 2"  (1.575 m)  Wt 145 lb 4 oz (65.885 kg)  BMI 26.56 kg/m2  Physical Exam  Nursing note and vitals reviewed. Constitutional: She is oriented to person, place, and time. She appears well-developed and well-nourished.  HENT:  Head: Normocephalic.  Nose: Nose normal.  Mouth/Throat: Oropharynx is clear and moist.  Eyes: Conjunctivae are normal. Pupils are equal, round, and reactive to light.  Neck: Normal range of motion. Neck supple. No JVD present.  Cardiovascular: Normal rate, regular rhythm, S1 normal, S2  normal and intact distal pulses.  Exam reveals no gallop and no friction rub.   Murmur heard.  Systolic murmur is present with a grade of 2/6  Pulmonary/Chest: Effort normal and breath sounds normal. No respiratory distress.  She has no wheezes. She has no rales. She exhibits no tenderness.  Abdominal: Soft. Bowel sounds are normal. She exhibits no distension. There is no tenderness.  Musculoskeletal: Normal range of motion. She exhibits no edema and no tenderness.  Lymphadenopathy:    She has no cervical adenopathy.  Neurological: She is alert and oriented to person, place, and time. Coordination normal.  Skin: Skin is warm and dry. No rash noted. No erythema.  Psychiatric: She has a normal mood and affect. Her behavior is normal. Judgment and thought content normal.    Assessment and Plan

## 2014-02-09 NOTE — Assessment & Plan Note (Signed)
Blood pressure is well controlled on today's visit. No changes made to the medications. 

## 2014-02-09 NOTE — Patient Instructions (Addendum)
You are doing well.  Please drop the dose of the aspirin back to 1 to 2 asa 81 mg daily with plavix  We will schedule a carotid ultrasound for CEA on the left  Please call us if you have new issues that need to be addressed before your next appt.  Your physician wants you to follow-up in: 6 months.  You will receive a reminder letter in the mail two months in advance. If you don't receive a letter, please call our office to schedule the follow-up appointment.

## 2014-02-09 NOTE — Assessment & Plan Note (Signed)
Currently with no symptoms of angina. No further workup at this time. Continue current medication regimen. 

## 2014-02-09 NOTE — Assessment & Plan Note (Signed)
She reports her breathing is mild, stable.

## 2014-02-14 ENCOUNTER — Encounter (INDEPENDENT_AMBULATORY_CARE_PROVIDER_SITE_OTHER): Payer: Commercial Managed Care - HMO

## 2014-02-14 DIAGNOSIS — I519 Heart disease, unspecified: Secondary | ICD-10-CM

## 2014-02-14 DIAGNOSIS — I6529 Occlusion and stenosis of unspecified carotid artery: Secondary | ICD-10-CM

## 2014-02-14 DIAGNOSIS — R0602 Shortness of breath: Secondary | ICD-10-CM

## 2014-02-14 DIAGNOSIS — I214 Non-ST elevation (NSTEMI) myocardial infarction: Secondary | ICD-10-CM

## 2014-02-14 DIAGNOSIS — E785 Hyperlipidemia, unspecified: Secondary | ICD-10-CM

## 2014-02-14 DIAGNOSIS — Z9889 Other specified postprocedural states: Secondary | ICD-10-CM

## 2014-03-03 ENCOUNTER — Other Ambulatory Visit: Payer: Self-pay

## 2014-03-03 MED ORDER — HYDROCHLOROTHIAZIDE 25 MG PO TABS: 25.0000 mg | ORAL_TABLET | ORAL | Status: DC

## 2014-03-03 NOTE — Telephone Encounter (Signed)
Refill sent for hctz

## 2014-03-08 ENCOUNTER — Telehealth: Payer: Self-pay

## 2014-03-08 MED ORDER — HYDROCHLOROTHIAZIDE 25 MG PO TABS: 25.0000 mg | ORAL_TABLET | ORAL | Status: DC

## 2014-03-08 NOTE — Telephone Encounter (Signed)
Refill sent for HCTZ

## 2014-03-18 ENCOUNTER — Other Ambulatory Visit: Payer: Self-pay | Admitting: Physician Assistant

## 2014-04-04 ENCOUNTER — Encounter: Payer: Self-pay | Admitting: Physician Assistant

## 2014-04-04 ENCOUNTER — Ambulatory Visit (INDEPENDENT_AMBULATORY_CARE_PROVIDER_SITE_OTHER): Payer: Commercial Managed Care - HMO | Admitting: Physician Assistant

## 2014-04-04 VITALS — BP 147/73 | HR 52 | Temp 98.5°F | Resp 16 | Ht 62.0 in | Wt 151.5 lb

## 2014-04-04 DIAGNOSIS — N3000 Acute cystitis without hematuria: Secondary | ICD-10-CM

## 2014-04-04 LAB — POCT URINALYSIS DIPSTICK
Bilirubin, UA: NEGATIVE
GLUCOSE UA: NEGATIVE
Ketones, UA: NEGATIVE
Nitrite, UA: NEGATIVE
PROTEIN UA: NEGATIVE
SPEC GRAV UA: 1.01
Urobilinogen, UA: 1
pH, UA: 5

## 2014-04-04 MED ORDER — CEPHALEXIN 500 MG PO CAPS: 500.0000 mg | ORAL_CAPSULE | Freq: Two times a day (BID) | ORAL | Status: DC

## 2014-04-04 NOTE — Patient Instructions (Addendum)
Please take antibiotic as directed.  Stay well hydrated.  Start a daily probiotic (Align, Culturelle).  I will call you with your results.  Follow-up in 1 week.  Urinary Tract Infection A urinary tract infection (UTI) can occur any place along the urinary tract. The tract includes the kidneys, ureters, bladder, and urethra. A type of germ called bacteria often causes a UTI. UTIs are often helped with antibiotic medicine.  HOME CARE   If given, take antibiotics as told by your doctor. Finish them even if you start to feel better.  Drink enough fluids to keep your pee (urine) clear or pale yellow.  Avoid tea, drinks with caffeine, and bubbly (carbonated) drinks.  Pee often. Avoid holding your pee in for a long time.  Pee before and after having sex (intercourse).  Wipe from front to back after you poop (bowel movement) if you are a woman. Use each tissue only once. GET HELP RIGHT AWAY IF:   You have back pain.  You have lower belly (abdominal) pain.  You have chills.  You feel sick to your stomach (nauseous).  You throw up (vomit).  Your burning or discomfort with peeing does not go away.  You have a fever.  Your symptoms are not better in 3 days. MAKE SURE YOU:   Understand these instructions.  Will watch your condition.  Will get help right away if you are not doing well or get worse. Document Released: 10/29/2007 Document Revised: 02/04/2012 Document Reviewed: 12/11/2011 [facility] Patient Information 2015 [place], [place]. This information is not intended to replace advice given to you by your health care provider. Make sure you discuss any questions you have with your health care provider.

## 2014-04-04 NOTE — Addendum Note (Signed)
Addended by: Rockwell Germany on: 04/04/2014 02:34 PM   Modules accepted: Orders

## 2014-04-04 NOTE — Progress Notes (Signed)
PCP:[name], MD Chief Complaint  Patient presents with  . Urinary Tract Infection    Pt c/o UTI symptoms: urinary frequency & urgency x2 Mths    Current Issues:  Presents with 1 month of urinary urgency and urinary frequency Associated symptoms include:  cloudy urine and dysuria  There is no history of of similar symptoms. Sexually active:  No   No concern for STI.  Prior to Admission medications   Medication Sig Start Date End Date Taking? Authorizing Provider  acetaminophen (TYLENOL) 650 MG CR tablet Take 1,300 mg by mouth 2 (two) times daily.   Yes Historical Provider, MD  albuterol (PROVENTIL HFA;VENTOLIN HFA) 108 (90 BASE) MCG/ACT inhaler Inhale 2 puffs into the lungs every 6 (six) hours as needed for wheezing. 09/14/13  Yes Mosie Lukes, MD  aspirin 81 MG tablet Take 81 mg by mouth daily.   Yes Historical Provider, MD  budesonide-formoterol (SYMBICORT) 160-4.5 MCG/ACT inhaler Inhale 2 puffs into the lungs 2 (two) times daily. 09/22/13  Yes Mosie Lukes, MD  Calcium Carbonate-Vitamin D (CALCIUM + D PO) Take 1 tablet by mouth 2 (two) times daily.   Yes Historical Provider, MD  clopidogrel (PLAVIX) 75 MG tablet Take 1 tablet (75 mg total) by mouth daily. 02/09/14  Yes Minna Merritts, MD  diclofenac sodium (VOLTAREN) 1 % GEL Apply 2 g topically 3 (three) times daily as needed (pain).   Yes Historical Provider, MD  fluticasone (FLONASE) 50 MCG/ACT nasal spray Place 2 sprays into the nose 2 (two) times daily as needed. For congestion   Yes Historical Provider, MD  hydrochlorothiazide (HYDRODIURIL) 25 MG tablet Take 1 tablet (25 mg total) by mouth every morning. 03/08/14  Yes Minna Merritts, MD  HYDROcodone-acetaminophen (NORCO/VICODIN) 5-325 MG per tablet Take 1 tablet by mouth 2 (two) times daily as needed for pain. 10/05/12  Yes Mosie Lukes, MD  isosorbide mononitrate (IMDUR) 30 MG 24 hr tablet Take 1 tablet (30 mg total) by mouth daily. 02/09/14  Yes Minna Merritts, MD   Krill Oil 1000 MG CAPS Take 1,000 mg by mouth daily.   Yes Historical Provider, MD  losartan (COZAAR) 100 MG tablet Take 1 tablet (100 mg total) by mouth daily. 02/09/14  Yes Minna Merritts, MD  montelukast (SINGULAIR) 10 MG tablet TAKE 1 TABLET BY MOUTH AT BEDTIME 01/31/14  Yes Mosie Lukes, MD  mupirocin ointment (BACTROBAN) 2 % Place 1 application into the nose daily. Via qtip to ear daily 01/24/14  Yes Mosie Lukes, MD  nitroGLYCERIN (NITROSTAT) 0.4 MG SL tablet Place 1 tablet (0.4 mg total) under the tongue every 5 (five) minutes as needed for chest pain. 02/09/14  Yes Minna Merritts, MD  pantoprazole (PROTONIX) 40 MG tablet Take 1 tablet (40 mg total) by mouth daily. 01/24/14  Yes Mosie Lukes, MD  Polyethyl Glycol-Propyl Glycol (SYSTANE OP) Place 1 drop into both eyes daily as needed (dry eyes).   Yes Historical Provider, MD  rosuvastatin (CRESTOR) 40 MG tablet Take 1 tablet (40 mg total) by mouth daily. 02/09/14  Yes Minna Merritts, MD  sertraline (ZOLOFT) 50 MG tablet Take 1 tablet (50 mg total) by mouth daily. 01/24/14  Yes Mosie Lukes, MD    Review of Systems:See HPI.  All other ROS are negative.  PE:  BP 147/73 mmHg  Pulse 52  Temp(Src) 98.5 F (36.9 C) (Oral)  Resp 16  Ht 5\' 2"  (1.575 m)  Wt 151 lb 8  oz (68.72 kg)  BMI 27.70 kg/m2  SpO2 98%  BP 147/73 mmHg  Pulse 52  Temp(Src) 98.5 F (36.9 C) (Oral)  Resp 16  Ht 5\' 2"  (1.575 m)  Wt 151 lb 8 oz (68.72 kg)  BMI 27.70 kg/m2  SpO2 98%  General Appearance:    Alert, cooperative, no distress, appears stated age  Head:    Normocephalic, without obvious abnormality, atraumatic  Eyes:    PERRL, conjunctiva/corneas clear, EOM's intact, fundi    benign, both eyes  Ears:    Normal TM's and external ear canals, both ears  Nose:   Nares normal, septum midline, mucosa normal, no drainage    or sinus tenderness  Throat:   Lips, mucosa, and tongue normal; teeth and gums normal  Neck:   Supple, symmetrical, trachea midline,  no adenopathy;    thyroid:  no enlargement/tenderness/nodules; no carotid   bruit or JVD  Back:     Symmetric, no curvature, ROM normal, no CVA tenderness  Lungs:     Clear to auscultation bilaterally, respirations unlabored  Chest Wall:    No tenderness or deformity   Heart:    Regular rate and rhythm, S1 and S2 normal, no murmur, rub   or gallop  Breast Exam:    No tenderness, masses, or nipple abnormality  Abdomen:     Soft, non-tender, bowel sounds active all four quadrants,    no masses, no organomegaly  Genitalia:    Normal female without lesion, discharge or tenderness  Rectal:    Normal tone, normal prostate, no masses or tenderness;   guaiac negative stool  Extremities:   Extremities normal, atraumatic, no cyanosis or edema  Pulses:   2+ and symmetric all extremities  Skin:   Skin color, texture, turgor normal, no rashes or lesions  Lymph nodes:   Cervical, supraclavicular, and axillary nodes normal  Neurologic:   CNII-XII intact, normal strength, sensation and reflexes    throughout     Results for orders placed or performed in visit on 01/24/14  Renal function panel  Result Value Ref Range   Sodium 140 135 - 145 mEq/L   Potassium 3.7 3.5 - 5.1 mEq/L   Chloride 102 96 - 112 mEq/L   CO2 31 19 - 32 mEq/L   Calcium 9.5 8.4 - 10.5 mg/dL   Albumin 4.0 3.5 - 5.2 g/dL   BUN 19 6 - 23 mg/dL   Creatinine, Ser 0.9 0.4 - 1.2 mg/dL   Glucose, Bld 94 70 - 99 mg/dL   Phosphorus 3.4 2.3 - 4.6 mg/dL   GFR 69.33 >60.00 mL/min  TSH  Result Value Ref Range   TSH 3.92 0.35 - 4.50 uIU/mL  Lipid panel  Result Value Ref Range   Cholesterol 136 0 - 200 mg/dL   Triglycerides 134.0 0.0 - 149.0 mg/dL   HDL 34.50 (L) >39.00 mg/dL   VLDL 26.8 0.0 - 40.0 mg/dL   LDL Cholesterol 75 0 - 99 mg/dL   Total CHOL/HDL Ratio 4    NonHDL 101.50   Hepatic function panel  Result Value Ref Range   Total Bilirubin 0.5 0.2 - 1.2 mg/dL   Bilirubin, Direct 0.0 0.0 - 0.3 mg/dL   Alkaline Phosphatase  73 39 - 117 U/L   AST 15 0 - 37 U/L   ALT 13 0 - 35 U/L   Total Protein 7.4 6.0 - 8.3 g/dL   Albumin 4.0 3.5 - 5.2 g/dL  CBC  Result Value Ref Range  WBC 8.4 4.0 - 10.5 K/uL   RBC 5.21 (H) 3.87 - 5.11 Mil/uL   Platelets 215.0 150.0 - 400.0 K/uL   Hemoglobin 13.8 12.0 - 15.0 g/dL   HCT 42.6 36.0 - 46.0 %   MCV 81.8 78.0 - 100.0 fl   MCHC 32.4 30.0 - 36.0 g/dL   RDW 17.5 (H) 11.5 - 15.5 %    Acute cystitis without hematuria [N30.00] Will send urine for culture.  + blood and large LE.  Will Rx Keflex BID x 7 days.  Will tailor therapy based on culture results. Increased fluids.  Probiotic.  Follow-up in 1 week.  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

## 2014-04-04 NOTE — Assessment & Plan Note (Signed)
Will send urine for culture.  + blood and large LE.  Will Rx Keflex BID x 7 days.  Will tailor therapy based on culture results. Increased fluids.  Probiotic.  Follow-up in 1 week.  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

## 2014-04-04 NOTE — Progress Notes (Signed)
Pre visit review using our clinic review tool, if applicable. No additional management support is needed unless otherwise documented below in the visit note/SLS  

## 2014-04-06 ENCOUNTER — Other Ambulatory Visit: Payer: Self-pay | Admitting: Family Medicine

## 2014-04-07 LAB — URINE CULTURE: Colony Count: >=100000

## 2014-04-11 ENCOUNTER — Encounter: Payer: Self-pay | Admitting: Physician Assistant

## 2014-04-11 ENCOUNTER — Ambulatory Visit (INDEPENDENT_AMBULATORY_CARE_PROVIDER_SITE_OTHER): Payer: Commercial Managed Care - HMO | Admitting: Physician Assistant

## 2014-04-11 ENCOUNTER — Ambulatory Visit: Payer: Commercial Managed Care - HMO | Admitting: Physician Assistant

## 2014-04-11 VITALS — BP 119/75 | HR 61 | Temp 97.8°F | Resp 18 | Ht 62.0 in | Wt 150.0 lb

## 2014-04-11 DIAGNOSIS — N3090 Cystitis, unspecified without hematuria: Secondary | ICD-10-CM

## 2014-04-11 DIAGNOSIS — B9689 Other specified bacterial agents as the cause of diseases classified elsewhere: Secondary | ICD-10-CM

## 2014-04-11 DIAGNOSIS — J019 Acute sinusitis, unspecified: Secondary | ICD-10-CM

## 2014-04-11 LAB — POCT URINALYSIS DIPSTICK
Glucose, UA: NEGATIVE
KETONES UA: NEGATIVE
Leukocytes, UA: NEGATIVE
Nitrite, UA: NEGATIVE
Spec Grav, UA: 1.025
Urobilinogen, UA: 1
pH, UA: 6

## 2014-04-11 MED ORDER — CEFDINIR 300 MG PO CAPS: 300.0000 mg | ORAL_CAPSULE | Freq: Two times a day (BID) | ORAL | Status: DC

## 2014-04-11 NOTE — Assessment & Plan Note (Signed)
Rx Cefdinir.  Will obtain repeat Urine culture.  Supportive measures discussed with patient.

## 2014-04-11 NOTE — Progress Notes (Signed)
Pre visit review using our clinic review tool, if applicable. No additional management support is needed unless otherwise documented below in the visit note/SLS  

## 2014-04-11 NOTE — Progress Notes (Signed)
Patient presents to clinic today c/o continued urinary urgency and frequency.  Patient was seen last week and diagnosed with E. Coli cystitis.   Was started on Keflex as patient is allergic to multiple antibiotics.  Endorses taking medication as directed.  Endorses improvement in symptoms, but not resolution.  Denies nausea/vomiting, fever, chills or flank pain.  Patient also c/o sinus pressure, sinus pain, sore throat and PND.  Patient with significant history of bacterial sinusitis s/p multiple sinus surgeries.  Denies recent travel or sick contact.   Past Medical History  Diagnosis Date  . COPD (chronic obstructive pulmonary disease)   . Cerebrovascular disease 01/2009    carotid u/s  R 0-39%   L 60-79%  . CAD (coronary artery disease)     s/CABG (reports IMA and 2 SVGs) back in 1999  Myoview normal 3/10; s/p PCI with DES to PL branch of distal RCA 09/2011  . Depression   . GERD (gastroesophageal reflux disease)   . Hypertension   . Hyperlipidemia   . Osteoporosis   . PVD (peripheral vascular disease)   . Dizziness   . AAA (abdominal aortic aneurysm) 01/2009    AAA (2.8 x 3.0)  moderate RAS (left); 2.7 x 2.7 cm (07/11/05)  . NSTEMI (non-ST elevated myocardial infarction) 11/12    Cath showed atretic IMA graft to the LAD, SVG to PD was patent but the continuation of this graft to the PL branch was occluded; there were L to R collaterals; Mid LAD had a 60 to 70% stenosis. She has been treated medically.  Neg Myoview 05/2011  . Bradycardia     Metoprolol stopped 08/2011  . Angina   . Dysrhythmia     hx of sinus brady  . Shortness of breath   . Blood transfusion     hx of transfusion   . Herniated lumbar disc without myelopathy   . Anosmia   . Acute bronchitis 03/20/2013  . Medicare annual wellness visit, subsequent 07/31/2013  . Thoracic back pain 01/24/2014  . Baker's cyst of knee 01/30/2014    Current Outpatient Prescriptions on File Prior to Visit  Medication Sig Dispense Refill  .  acetaminophen (TYLENOL) 650 MG CR tablet Take 1,300 mg by mouth 2 (two) times daily.    Marland Kitchen albuterol (PROVENTIL HFA;VENTOLIN HFA) 108 (90 BASE) MCG/ACT inhaler Inhale 2 puffs into the lungs every 6 (six) hours as needed for wheezing. 1 Inhaler 2  . aspirin 81 MG tablet Take 81 mg by mouth daily.    . budesonide-formoterol (SYMBICORT) 160-4.5 MCG/ACT inhaler Inhale 2 puffs into the lungs 2 (two) times daily. 1 Inhaler 2  . Calcium Carbonate-Vitamin D (CALCIUM + D PO) Take 1 tablet by mouth 2 (two) times daily.    . clopidogrel (PLAVIX) 75 MG tablet Take 1 tablet (75 mg total) by mouth daily. 90 tablet 3  . diclofenac sodium (VOLTAREN) 1 % GEL Apply 2 g topically 3 (three) times daily as needed (pain).    . fluticasone (FLONASE) 50 MCG/ACT nasal spray Place 2 sprays into the nose 2 (two) times daily as needed. For congestion    . hydrochlorothiazide (HYDRODIURIL) 25 MG tablet Take 1 tablet (25 mg total) by mouth every morning. 30 tablet 6  . HYDROcodone-acetaminophen (NORCO/VICODIN) 5-325 MG per tablet Take 1 tablet by mouth 2 (two) times daily as needed for pain. 30 tablet 1  . isosorbide mononitrate (IMDUR) 30 MG 24 hr tablet Take 1 tablet (30 mg total) by mouth daily. Branch  tablet 3  . Krill Oil 1000 MG CAPS Take 1,000 mg by mouth daily.    Marland Kitchen losartan (COZAAR) 100 MG tablet Take 1 tablet (100 mg total) by mouth daily. 90 tablet 3  . montelukast (SINGULAIR) 10 MG tablet TAKE 1 TABLET BY MOUTH AT BEDTIME 30 tablet 1  . mupirocin ointment (BACTROBAN) 2 % Place 1 application into the nose daily. Via qtip to ear daily 22 g 0  . nitroGLYCERIN (NITROSTAT) 0.4 MG SL tablet Place 1 tablet (0.4 mg total) under the tongue every 5 (five) minutes as needed for chest pain. 25 tablet 6  . pantoprazole (PROTONIX) 40 MG tablet Take 1 tablet (40 mg total) by mouth daily. 90 tablet 3  . Polyethyl Glycol-Propyl Glycol (SYSTANE OP) Place 1 drop into both eyes daily as needed (dry eyes).    . rosuvastatin (CRESTOR) 40 MG  tablet Take 1 tablet (40 mg total) by mouth daily. 90 tablet 3  . sertraline (ZOLOFT) 50 MG tablet Take 1 tablet (50 mg total) by mouth daily. 90 tablet 1   No current facility-administered medications on file prior to visit.    Allergies  Allergen Reactions  . Ciprofloxacin     REACTION: rash IV  . Codeine     REACTION: nausea/vomiting  . Erythromycin     REACTION: tongue burns  . Meperidine Hcl     REACTION: Nausea/vomiting  . Penicillins     REACTION: rash  . Shellfish Allergy Nausea And Vomiting    Family History  Problem Relation Age of Onset  . Heart attack Mother 61  . Diabetes Mother   . Colon cancer Father 25  . Lung cancer Father     smoked  . Heart disease Father     angina  . Hypertension Father   . Cancer Father     colon, lung  . Lung cancer Sister     smoked  . Cancer Sister   . Hypothyroidism Sister   . Hypertension Brother   . Heart attack Brother   . Heart disease Brother     stent  . Heart attack Brother     CABG  . Heart disease Brother     CABG with 1 bypass  . Aneurysm Brother     brain  . Alcohol abuse Brother   . Hyperlipidemia Daughter   . Diabetes Maternal Grandmother   . Stroke Maternal Grandmother   . Cirrhosis Maternal Grandfather   . Stroke Paternal Grandmother   . Obesity Daughter   . Obesity Son     History   Social History  . Marital Status: Married    Spouse Name: N/A    Number of Children: 51  . Years of Education: N/A   Occupational History  . Retired     former  Marine scientist   Social History Main Topics  . Smoking status: Former Smoker -- 0.50 packs/day for 40 years    Types: Cigarettes    Quit date: 01/19/1993  . Smokeless tobacco: Never Used  . Alcohol Use: No     Comment: rare  . Drug Use: No  . Sexual Activity: No   Other Topics Concern  . None   Social History Narrative   Married with 5 children    Review of Systems - See HPI.  All other ROS are negative.  BP 119/75 mmHg  Pulse 61  Temp(Src)  97.8 F (36.6 C) (Oral)  Resp 18  Ht 5\' 2"  (1.575 m)  Wt 150 lb (  68.04 kg)  BMI 27.43 kg/m2  SpO2 97%  Physical Exam  Constitutional: She is oriented to person, place, and time and well-developed, well-nourished, and in no distress.  HENT:  Head: Normocephalic and atraumatic.  Right Ear: External ear normal.  Left Ear: External ear normal.  Nose: Nose normal.  Mouth/Throat: Oropharynx is clear and moist. No oropharyngeal exudate.  TM within normal limits bilaterally.  + TTP of sinuses bilaterally.  Eyes: Conjunctivae are normal. Pupils are equal, round, and reactive to light.  Neck: Neck supple.  Cardiovascular: Normal rate, regular rhythm, normal heart sounds and intact distal pulses.   Pulmonary/Chest: Effort normal and breath sounds normal. No respiratory distress. She has no wheezes. She has no rales.  Abdominal: Soft. Bowel sounds are normal. She exhibits no distension and no mass. There is no tenderness. There is no rebound and no guarding.  Negative CVA tenderness.  Lymphadenopathy:    She has no cervical adenopathy.  Neurological: She is alert and oriented to person, place, and time.  Skin: Skin is warm and dry. No rash noted.  Psychiatric: Affect normal.  Vitals reviewed.   Recent Results (from the past 2160 hour(s))  Renal function panel     Status: None   Collection Time: 01/24/14 11:29 AM  Result Value Ref Range   Sodium 140 135 - 145 mEq/L   Potassium 3.7 3.5 - 5.1 mEq/L   Chloride 102 96 - 112 mEq/L   CO2 31 19 - 32 mEq/L   Calcium 9.5 8.4 - 10.5 mg/dL   Albumin 4.0 3.5 - 5.2 g/dL   BUN 19 6 - 23 mg/dL   Creatinine, Ser 0.9 0.4 - 1.2 mg/dL   Glucose, Bld 94 70 - 99 mg/dL   Phosphorus 3.4 2.3 - 4.6 mg/dL   GFR 69.33 >60.00 mL/min  TSH     Status: None   Collection Time: 01/24/14 11:29 AM  Result Value Ref Range   TSH 3.92 0.35 - 4.50 uIU/mL  Lipid panel     Status: Abnormal   Collection Time: 01/24/14 11:29 AM  Result Value Ref Range   Cholesterol 136  0 - 200 mg/dL    Comment: ATP III Classification       Desirable:  < 200 mg/dL               Borderline High:  200 - 239 mg/dL          High:  > = 240 mg/dL   Triglycerides 134.0 0.0 - 149.0 mg/dL    Comment: Normal:  <150 mg/dLBorderline High:  150 - 199 mg/dL   HDL 34.50 (L) >39.00 mg/dL   VLDL 26.8 0.0 - 40.0 mg/dL   LDL Cholesterol 75 0 - 99 mg/dL   Total CHOL/HDL Ratio 4     Comment:                Men          Women1/2 Average Risk     3.4          3.3Average Risk          5.0          4.42X Average Risk          9.6          7.13X Average Risk          15.0          11.0  NonHDL 101.50     Comment: NOTE:  Non-HDL goal should be 30 mg/dL higher than patient's LDL goal (i.e. LDL goal of < 70 mg/dL, would have non-HDL goal of < 100 mg/dL)  Hepatic function panel     Status: None   Collection Time: 01/24/14 11:29 AM  Result Value Ref Range   Total Bilirubin 0.5 0.2 - 1.2 mg/dL   Bilirubin, Direct 0.0 0.0 - 0.3 mg/dL   Alkaline Phosphatase 73 39 - 117 U/L   AST 15 0 - 37 U/L   ALT 13 0 - 35 U/L   Total Protein 7.4 6.0 - 8.3 g/dL   Albumin 4.0 3.5 - 5.2 g/dL  CBC     Status: Abnormal   Collection Time: 01/24/14 11:29 AM  Result Value Ref Range   WBC 8.4 4.0 - 10.5 K/uL   RBC 5.21 (H) 3.87 - 5.11 Mil/uL   Platelets 215.0 150.0 - 400.0 K/uL   Hemoglobin 13.8 12.0 - 15.0 g/dL   HCT 42.6 36.0 - 46.0 %   MCV 81.8 78.0 - 100.0 fl   MCHC 32.4 30.0 - 36.0 g/dL   RDW 17.5 (H) 11.5 - 15.5 %  Urine culture     Status: None   Collection Time: 04/04/14  2:29 PM  Result Value Ref Range   Culture ESCHERICHIA COLI    Colony Count >=100,000 COLONIES/ML    Organism ID, Bacteria ESCHERICHIA COLI       Susceptibility   Escherichia coli -  (no method available)    AMPICILLIN 4 Sensitive     AMOX/CLAVULANIC <=2 Sensitive     AMPICILLIN/SULBACTAM <=2 Sensitive     PIP/TAZO <=4 Sensitive     IMIPENEM <=0.25 Sensitive     CEFAZOLIN <=4 Sensitive     CEFTRIAXONE <=1  Sensitive     CEFTAZIDIME <=1 Sensitive     CEFEPIME <=1 Sensitive     GENTAMICIN <=1 Sensitive     TOBRAMYCIN <=1 Sensitive     CIPROFLOXACIN <=0.25 Sensitive     LEVOFLOXACIN <=0.12 Sensitive     NITROFURANTOIN <=16 Sensitive     TRIMETH/SULFA <=20 Sensitive   POCT urinalysis dipstick     Status: None   Collection Time: 04/04/14  2:30 PM  Result Value Ref Range   Color, UA yellow     Comment: Results by provider, Brunetta Jeans, PA-C   Clarity, UA cloudy    Glucose, UA neg    Bilirubin, UA neg    Ketones, UA neg    Spec Grav, UA 1.010    Blood, UA moderate    pH, UA 5.0    Protein, UA neg    Urobilinogen, UA 1.0    Nitrite, UA neg    Leukocytes, UA large (3+)   POCT urinalysis dipstick     Status: None   Collection Time: 04/11/14 11:45 AM  Result Value Ref Range   Color, UA yellow    Clarity, UA cloudy    Glucose, UA neg    Bilirubin, UA small    Ketones, UA neg    Spec Grav, UA 1.025    Blood, UA moderate    pH, UA 6.0    Protein, UA trace    Urobilinogen, UA 1.0    Nitrite, UA negative    Leukocytes, UA Negative     Assessment/Plan: Acute bacterial sinusitis Rx Cefdinir.  Increase fluids.  Rest.  Saline nasal spray.  Probiotic.  Humidifier in bedroom.  Return precautions discussed with pate  Cystitis Rx Cefdinir.  Will obtain repeat Urine culture.  Supportive measures discussed with patient.

## 2014-04-11 NOTE — Patient Instructions (Signed)
Please take Cefdinir as directed until tablets are gone. Increase fluid intake.  Continue other medications as directed.  I will call you with your results.  Follow-up if symptoms are not improving.  Sinusitis Sinusitis is redness, soreness, and inflammation of the paranasal sinuses. Paranasal sinuses are air pockets within the bones of your face (beneath the eyes, the middle of the forehead, or above the eyes). In healthy paranasal sinuses, mucus is able to drain out, and air is able to circulate through them by way of your nose. However, when your paranasal sinuses are inflamed, mucus and air can become trapped. This can allow bacteria and other germs to grow and cause infection. Sinusitis can develop quickly and last only a short time (acute) or continue over a long period (chronic). Sinusitis that lasts for more than 12 weeks is considered chronic.  CAUSES  Causes of sinusitis include:  Allergies.  Structural abnormalities, such as displacement of the cartilage that separates your nostrils (deviated septum), which can decrease the air flow through your nose and sinuses and affect sinus drainage.  Functional abnormalities, such as when the small hairs (cilia) that line your sinuses and help remove mucus do not work properly or are not present. SIGNS AND SYMPTOMS  Symptoms of acute and chronic sinusitis are the same. The primary symptoms are pain and pressure around the affected sinuses. Other symptoms include:  Upper toothache.  Earache.  Headache.  Bad breath.  Decreased sense of smell and taste.  A cough, which worsens when you are lying flat.  Fatigue.  Fever.  Thick drainage from your nose, which often is green and may contain pus (purulent).  Swelling and warmth over the affected sinuses. DIAGNOSIS  Your health care provider will perform a physical exam. During the exam, your health care provider may:  Look in your nose for signs of abnormal growths in your nostrils  (nasal polyps).  Tap over the affected sinus to check for signs of infection.  View the inside of your sinuses (endoscopy) using an imaging device that has a light attached (endoscope). If your health care provider suspects that you have chronic sinusitis, one or more of the following tests may be recommended:  Allergy tests.  Nasal culture. A sample of mucus is taken from your nose, sent to a lab, and screened for bacteria.  Nasal cytology. A sample of mucus is taken from your nose and examined by your health care provider to determine if your sinusitis is related to an allergy. TREATMENT  Most cases of acute sinusitis are related to a viral infection and will resolve on their own within 10 days. Sometimes medicines are prescribed to help relieve symptoms (pain medicine, decongestants, nasal steroid sprays, or saline sprays).  However, for sinusitis related to a bacterial infection, your health care provider will prescribe antibiotic medicines. These are medicines that will help kill the bacteria causing the infection.  Rarely, sinusitis is caused by a fungal infection. In theses cases, your health care provider will prescribe antifungal medicine. For some cases of chronic sinusitis, surgery is needed. Generally, these are cases in which sinusitis recurs more than 3 times per year, despite other treatments. HOME CARE INSTRUCTIONS   Drink plenty of water. Water helps thin the mucus so your sinuses can drain more easily.  Use a humidifier.  Inhale steam 3 to 4 times a day (for example, sit in the bathroom with the shower running).  Apply a warm, moist washcloth to your face 3 to 4 times  a day, or as directed by your health care provider.  Use saline nasal sprays to help moisten and clean your sinuses.  Take medicines only as directed by your health care provider.  If you were prescribed either an antibiotic or antifungal medicine, finish it all even if you start to feel better. SEEK  IMMEDIATE MEDICAL CARE IF:  You have increasing pain or severe headaches.  You have nausea, vomiting, or drowsiness.  You have swelling around your face.  You have vision problems.  You have a stiff neck.  You have difficulty breathing. MAKE SURE YOU:   Understand these instructions.  Will watch your condition.  Will get help right away if you are not doing well or get worse. Document Released: 05/12/2005 Document Revised: 09/26/2013 Document Reviewed: 05/27/2011 [facility] Patient Information 2015 [place], [place]. This information is not intended to replace advice given to you by your health care provider. Make sure you discuss any questions you have with your health care provider.

## 2014-04-11 NOTE — Assessment & Plan Note (Signed)
Rx Cefdinir.  Increase fluids.  Rest.  Saline nasal spray.  Probiotic.  Humidifier in bedroom.  Return precautions discussed with pate

## 2014-04-12 LAB — CULTURE, URINE COMPREHENSIVE
COLONY COUNT: NO GROWTH
Organism ID, Bacteria: NO GROWTH

## 2014-04-27 ENCOUNTER — Telehealth: Payer: Self-pay

## 2014-04-27 DIAGNOSIS — N3090 Cystitis, unspecified without hematuria: Secondary | ICD-10-CM

## 2014-04-27 DIAGNOSIS — R319 Hematuria, unspecified: Secondary | ICD-10-CM

## 2014-04-27 NOTE — Telephone Encounter (Signed)
-----   Message from Brunetta Jeans, PA-C sent at 04/13/2014  9:43 AM EST ----- Urine culture negative.  Infection has cleared. No more antibiotics needed.  There does seem to be some possible evidence of blood in urine.  Recommend she return to lab in 2 weeks for a formal Urinalysis to check for RBCs under microscope.  If present, she will need workup by Urology.

## 2014-04-27 NOTE — Telephone Encounter (Signed)
Appointment scheduled.  UA ordered.

## 2014-04-28 ENCOUNTER — Other Ambulatory Visit: Payer: Commercial Managed Care - HMO

## 2014-05-03 ENCOUNTER — Encounter (HOSPITAL_COMMUNITY): Payer: Self-pay | Admitting: Internal Medicine

## 2014-05-03 ENCOUNTER — Other Ambulatory Visit: Payer: Commercial Managed Care - HMO

## 2014-05-04 ENCOUNTER — Other Ambulatory Visit (INDEPENDENT_AMBULATORY_CARE_PROVIDER_SITE_OTHER): Payer: Commercial Managed Care - HMO

## 2014-05-04 ENCOUNTER — Encounter (HOSPITAL_COMMUNITY): Payer: Self-pay | Admitting: Cardiology

## 2014-05-04 DIAGNOSIS — N3090 Cystitis, unspecified without hematuria: Secondary | ICD-10-CM

## 2014-05-04 DIAGNOSIS — R319 Hematuria, unspecified: Secondary | ICD-10-CM

## 2014-05-04 LAB — URINALYSIS, ROUTINE W REFLEX MICROSCOPIC
BILIRUBIN URINE: NEGATIVE
Ketones, ur: NEGATIVE
Leukocytes, UA: NEGATIVE
NITRITE: NEGATIVE
PH: 5.5 (ref 5.0–8.0)
Specific Gravity, Urine: 1.025 (ref 1.000–1.030)
TOTAL PROTEIN, URINE-UPE24: 30 — AB
URINE GLUCOSE: NEGATIVE
Urobilinogen, UA: 0.2 (ref 0.0–1.0)

## 2014-05-17 ENCOUNTER — Encounter: Payer: Self-pay | Admitting: Family Medicine

## 2014-06-08 ENCOUNTER — Ambulatory Visit: Payer: Commercial Managed Care - HMO | Admitting: Family Medicine

## 2014-06-11 ENCOUNTER — Other Ambulatory Visit: Payer: Self-pay | Admitting: Family Medicine

## 2014-07-14 ENCOUNTER — Other Ambulatory Visit: Payer: Self-pay | Admitting: Family Medicine

## 2014-07-15 ENCOUNTER — Emergency Department (HOSPITAL_COMMUNITY)
Admission: EM | Admit: 2014-07-15 | Discharge: 2014-07-15 | Disposition: A | Discharge status: Home / Self Care | Payer: PPO | Attending: Emergency Medicine | Admitting: Emergency Medicine

## 2014-07-15 ENCOUNTER — Encounter (HOSPITAL_COMMUNITY): Payer: Self-pay | Admitting: Nurse Practitioner

## 2014-07-15 DIAGNOSIS — I739 Peripheral vascular disease, unspecified: Secondary | ICD-10-CM | POA: Insufficient documentation

## 2014-07-15 DIAGNOSIS — Z7902 Long term (current) use of antithrombotics/antiplatelets: Secondary | ICD-10-CM | POA: Diagnosis not present

## 2014-07-15 DIAGNOSIS — R0789 Other chest pain: Secondary | ICD-10-CM

## 2014-07-15 DIAGNOSIS — K219 Gastro-esophageal reflux disease without esophagitis: Secondary | ICD-10-CM | POA: Diagnosis not present

## 2014-07-15 DIAGNOSIS — R002 Palpitations: Secondary | ICD-10-CM | POA: Diagnosis present

## 2014-07-15 DIAGNOSIS — E876 Hypokalemia: Secondary | ICD-10-CM | POA: Diagnosis not present

## 2014-07-15 DIAGNOSIS — Z9861 Coronary angioplasty status: Secondary | ICD-10-CM | POA: Insufficient documentation

## 2014-07-15 DIAGNOSIS — Z87891 Personal history of nicotine dependence: Secondary | ICD-10-CM | POA: Insufficient documentation

## 2014-07-15 DIAGNOSIS — M810 Age-related osteoporosis without current pathological fracture: Secondary | ICD-10-CM | POA: Diagnosis not present

## 2014-07-15 DIAGNOSIS — I10 Essential (primary) hypertension: Secondary | ICD-10-CM | POA: Diagnosis not present

## 2014-07-15 DIAGNOSIS — J449 Chronic obstructive pulmonary disease, unspecified: Secondary | ICD-10-CM | POA: Diagnosis not present

## 2014-07-15 DIAGNOSIS — Z7951 Long term (current) use of inhaled steroids: Secondary | ICD-10-CM | POA: Diagnosis not present

## 2014-07-15 DIAGNOSIS — Z951 Presence of aortocoronary bypass graft: Secondary | ICD-10-CM | POA: Diagnosis not present

## 2014-07-15 DIAGNOSIS — Z792 Long term (current) use of antibiotics: Secondary | ICD-10-CM | POA: Diagnosis not present

## 2014-07-15 DIAGNOSIS — I252 Old myocardial infarction: Secondary | ICD-10-CM | POA: Insufficient documentation

## 2014-07-15 DIAGNOSIS — E785 Hyperlipidemia, unspecified: Secondary | ICD-10-CM | POA: Insufficient documentation

## 2014-07-15 DIAGNOSIS — Z7982 Long term (current) use of aspirin: Secondary | ICD-10-CM | POA: Diagnosis not present

## 2014-07-15 DIAGNOSIS — I25119 Atherosclerotic heart disease of native coronary artery with unspecified angina pectoris: Secondary | ICD-10-CM | POA: Insufficient documentation

## 2014-07-15 DIAGNOSIS — Z79899 Other long term (current) drug therapy: Secondary | ICD-10-CM | POA: Insufficient documentation

## 2014-07-15 DIAGNOSIS — Z880 Allergy status to penicillin: Secondary | ICD-10-CM | POA: Insufficient documentation

## 2014-07-15 DIAGNOSIS — F329 Major depressive disorder, single episode, unspecified: Secondary | ICD-10-CM | POA: Insufficient documentation

## 2014-07-15 DIAGNOSIS — Z9889 Other specified postprocedural states: Secondary | ICD-10-CM | POA: Insufficient documentation

## 2014-07-15 LAB — CBC WITH DIFFERENTIAL/PLATELET
Basophils Absolute: 0 10*3/uL (ref 0.0–0.1)
Basophils Relative: 1 % (ref 0–1)
EOS ABS: 0.1 10*3/uL (ref 0.0–0.7)
Eosinophils Relative: 1 % (ref 0–5)
HCT: 40.5 % (ref 36.0–46.0)
Hemoglobin: 13.6 g/dL (ref 12.0–15.0)
Lymphocytes Relative: 22 % (ref 12–46)
Lymphs Abs: 1.5 10*3/uL (ref 0.7–4.0)
MCH: 28 pg (ref 26.0–34.0)
MCHC: 33.6 g/dL (ref 30.0–36.0)
MCV: 83.3 fL (ref 78.0–100.0)
MONOS PCT: 3 % (ref 3–12)
Monocytes Absolute: 0.2 10*3/uL (ref 0.1–1.0)
NEUTROS ABS: 4.7 10*3/uL (ref 1.7–7.7)
Neutrophils Relative %: 73 % (ref 43–77)
PLATELETS: 164 10*3/uL (ref 150–400)
RBC: 4.86 MIL/uL (ref 3.87–5.11)
RDW: 14.2 % (ref 11.5–15.5)
WBC: 6.5 10*3/uL (ref 4.0–10.5)

## 2014-07-15 LAB — BASIC METABOLIC PANEL
Anion gap: 10 (ref 5–15)
BUN: 12 mg/dL (ref 6–23)
CHLORIDE: 103 mmol/L (ref 96–112)
CO2: 25 mmol/L (ref 19–32)
CREATININE: 0.95 mg/dL (ref 0.50–1.10)
Calcium: 9.3 mg/dL (ref 8.4–10.5)
GFR, EST AFRICAN AMERICAN: 66 mL/min — AB (ref >90)
GFR, EST NON AFRICAN AMERICAN: 57 mL/min — AB (ref >90)
GLUCOSE: 116 mg/dL — AB (ref 70–99)
Potassium: 2.8 mmol/L — ABNORMAL LOW (ref 3.5–5.1)
Sodium: 138 mmol/L (ref 135–145)

## 2014-07-15 LAB — I-STAT TROPONIN, ED
TROPONIN I, POC: 0.01 ng/mL (ref 0.00–0.08)
TROPONIN I, POC: 0 ng/mL (ref 0.00–0.08)

## 2014-07-15 LAB — URINALYSIS, ROUTINE W REFLEX MICROSCOPIC
BILIRUBIN URINE: NEGATIVE
GLUCOSE, UA: NEGATIVE mg/dL
Ketones, ur: NEGATIVE mg/dL
NITRITE: NEGATIVE
PH: 6 (ref 5.0–8.0)
Protein, ur: NEGATIVE mg/dL
SPECIFIC GRAVITY, URINE: 1.015 (ref 1.005–1.030)
UROBILINOGEN UA: 0.2 mg/dL (ref 0.0–1.0)

## 2014-07-15 LAB — URINE MICROSCOPIC-ADD ON

## 2014-07-15 LAB — TSH: TSH: 1.514 u[IU]/mL (ref 0.350–4.500)

## 2014-07-15 LAB — MAGNESIUM: Magnesium: 1.7 mg/dL (ref 1.5–2.5)

## 2014-07-15 MED ORDER — POTASSIUM CHLORIDE CRYS ER 20 MEQ PO TBCR
40.0000 meq | EXTENDED_RELEASE_TABLET | ORAL | Status: AC
  Administered 2014-07-15 (×2): 40 meq via ORAL
  Filled 2014-07-15: qty 2

## 2014-07-15 MED ORDER — SODIUM CHLORIDE 0.9 % IV BOLUS (SEPSIS)
500.0000 mL | Freq: Once | INTRAVENOUS | Status: AC
  Administered 2014-07-15: 500 mL via INTRAVENOUS

## 2014-07-15 MED ORDER — POTASSIUM CHLORIDE CRYS ER 20 MEQ PO TBCR
40.0000 meq | EXTENDED_RELEASE_TABLET | Freq: Once | ORAL | Status: AC
  Administered 2014-07-15: 40 meq via ORAL
  Filled 2014-07-15: qty 2

## 2014-07-15 MED ORDER — ALBUTEROL SULFATE HFA 108 (90 BASE) MCG/ACT IN AERS
2.0000 | INHALATION_SPRAY | Freq: Once | RESPIRATORY_TRACT | Status: AC
Start: 2014-07-15 — End: 2014-07-15
  Administered 2014-07-15: 2 via RESPIRATORY_TRACT
  Filled 2014-07-15: qty 6.7

## 2014-07-15 MED ORDER — ASPIRIN 81 MG PO CHEW
324.0000 mg | CHEWABLE_TABLET | Freq: Once | ORAL | Status: DC
  Filled 2014-07-15: qty 4

## 2014-07-15 NOTE — ED Provider Notes (Signed)
CSN: [id number]     Arrival date & time 07/15/14  1452 History   First MD Initiated Contact with Patient 07/15/14 1459     Chief Complaint  Patient presents with  . Palpitations   (Consider location/radiation/quality/duration/timing/severity/associated sxs/prior Treatment) Patient is a 74 y.o. female presenting with palpitations. The history is provided by the patient. No language interpreter was used.  Palpitations Palpitations quality:  Fast Onset quality:  Sudden Duration:  5 minutes Timing:  Sporadic ((has had 6-7episodes today)) Progression:  Improving Chronicity:  New Context: caffeine   Context: not anxiety, not appetite suppressants, not blood loss, not bronchodilators, not dehydration, not exercise, not hyperventilation, not illicit drugs, not nicotine and not stimulant use   Relieved by:  Nothing Worsened by:  Nothing Ineffective treatments:  None tried Associated symptoms: diaphoresis   Associated symptoms: no back pain, no chest pain, no chest pressure, no cough, no dizziness, no lower extremity edema, no malaise/fatigue, no nausea, no near-syncope, no numbness, no orthopnea, no shortness of breath, no syncope, no vomiting and no weakness   Risk factors: heart disease   Risk factors: no diabetes mellitus, no hx of atrial fibrillation, no hx of DVT, no hx of PE, no hx of thyroid disease, no hypercoagulable state, no hyperthyroidism, no OTC sinus medications and no stress     Past Medical History  Diagnosis Date  . COPD (chronic obstructive pulmonary disease)   . Cerebrovascular disease 01/2009    carotid u/s  R 0-39%   L 60-79%  . CAD (coronary artery disease)     s/CABG (reports IMA and 2 SVGs) back in 1999  Myoview normal 3/10; s/p PCI with DES to PL branch of distal RCA 09/2011  . Depression   . GERD (gastroesophageal reflux disease)   . Hypertension   . Hyperlipidemia   . Osteoporosis   . PVD (peripheral vascular disease)   . Dizziness   . AAA (abdominal aortic  aneurysm) 01/2009    AAA (2.8 x 3.0)  moderate RAS (left); 2.7 x 2.7 cm (07/11/05)  . NSTEMI (non-ST elevated myocardial infarction) 11/12    Cath showed atretic IMA graft to the LAD, SVG to PD was patent but the continuation of this graft to the PL branch was occluded; there were L to R collaterals; Mid LAD had a 60 to 70% stenosis. She has been treated medically.  Neg Myoview 05/2011  . Bradycardia     Metoprolol stopped 08/2011  . Angina   . Dysrhythmia     hx of sinus brady  . Shortness of breath   . Blood transfusion     hx of transfusion   . Herniated lumbar disc without myelopathy   . Anosmia   . Acute bronchitis 03/20/2013  . Medicare annual wellness visit, subsequent 07/31/2013  . Thoracic back pain 01/24/2014  . Baker's cyst of knee 01/30/2014   Past Surgical History  Procedure Laterality Date  . Coronary artery bypass graft  1999  . Tubal ligation    . Abdominal aortic aneurysm repair    . Femoral artery stent    . Closed reduction nasal fracture  11/2007  . Carotid endarterectomy  01/02/11    left  . Coronary angioplasty with stent placement  10/10/2011    drug eluting  to rc & saphenous  . Nasal sinus surgery      twice  . Left heart catheterization with coronary/graft angiogram N/A 04/22/2011    Procedure: LEFT HEART CATHETERIZATION WITH Beatrix Fetters;  Surgeon: Quillian Quince  R Bensimhon, MD;  Location: [place] [facility]ATH [facility];  Service: Cardiovascular;  Laterality: N/A;  . Left heart catheterization with coronary/graft angiogram N/A 10/10/2011    Procedure: LEFT HEART CATHETERIZATION WITH Beatrix Fetters;  Surgeon: Peter M Martinique, MD;  Location: [facility] CATH LAB;  Service: Cardiovascular;  Laterality: N/A;   Family History  Problem Relation Age of Onset  . Heart attack Mother 47  . Diabetes Mother   . Colon cancer Father 11  . Lung cancer Father     smoked  . Heart disease Father     angina  . Hypertension Father   . Cancer Father     colon, lung  . Lung cancer Sister      smoked  . Cancer Sister   . Hypothyroidism Sister   . Hypertension Brother   . Heart attack Brother   . Heart disease Brother     stent  . Heart attack Brother     CABG  . Heart disease Brother     CABG with 1 bypass  . Aneurysm Brother     brain  . Alcohol abuse Brother   . Hyperlipidemia Daughter   . Diabetes Maternal Grandmother   . Stroke Maternal Grandmother   . Cirrhosis Maternal Grandfather   . Stroke Paternal Grandmother   . Obesity Daughter   . Obesity Son    History  Substance Use Topics  . Smoking status: Former Smoker -- 0.50 packs/day for 40 years    Types: Cigarettes    Quit date: 01/19/1993  . Smokeless tobacco: Never Used  . Alcohol Use: No     Comment: rare   OB History    No data available     Review of Systems  Constitutional: Positive for diaphoresis. Negative for fever, chills and malaise/fatigue.  Respiratory: Negative for cough and shortness of breath.   Cardiovascular: Positive for palpitations. Negative for chest pain, orthopnea, syncope and near-syncope.  Gastrointestinal: Negative for nausea, vomiting, abdominal pain and diarrhea.  Genitourinary: Negative for dysuria.  Musculoskeletal: Negative for myalgias and back pain.  Skin: Negative for rash.  Neurological: Negative for dizziness, weakness, light-headedness, numbness and headaches.  Hematological: Negative for adenopathy. Does not bruise/bleed easily.  All other systems reviewed and are negative.     Allergies  Ciprofloxacin; Codeine; Erythromycin; Meperidine hcl; Penicillins; Shellfish allergy; and Tape  Home Medications   Prior to Admission medications   Medication Sig Start Date End Date Taking? Authorizing Provider  acetaminophen (TYLENOL) 650 MG CR tablet Take 1,300 mg by mouth 2 (two) times daily.    Historical Provider, MD  albuterol (PROVENTIL HFA;VENTOLIN HFA) 108 (90 BASE) MCG/ACT inhaler Inhale 2 puffs into the lungs every 6 (six) hours as needed for wheezing.  09/14/13   Mosie Lukes, MD  aspirin 81 MG tablet Take 81 mg by mouth daily.    Historical Provider, MD  budesonide-formoterol (SYMBICORT) 160-4.5 MCG/ACT inhaler Inhale 2 puffs into the lungs 2 (two) times daily. 09/22/13   Mosie Lukes, MD  Calcium Carbonate-Vitamin D (CALCIUM + D PO) Take 1 tablet by mouth 2 (two) times daily.    Historical Provider, MD  cefdinir (OMNICEF) 300 MG capsule Take 1 capsule (300 mg total) by mouth 2 (two) times daily. 04/11/14   Brunetta Jeans, PA-C  clopidogrel (PLAVIX) 75 MG tablet Take 1 tablet (75 mg total) by mouth daily. 02/09/14   Minna Merritts, MD  diclofenac sodium (VOLTAREN) 1 % GEL Apply 2 g topically 3 (three) times daily  as needed (pain).    Historical Provider, MD  fluticasone (FLONASE) 50 MCG/ACT nasal spray Place 2 sprays into the nose 2 (two) times daily as needed. For congestion    Historical Provider, MD  hydrochlorothiazide (HYDRODIURIL) 25 MG tablet Take 1 tablet (25 mg total) by mouth every morning. 03/08/14   Minna Merritts, MD  HYDROcodone-acetaminophen (NORCO/VICODIN) 5-325 MG per tablet Take 1 tablet by mouth 2 (two) times daily as needed for pain. 10/05/12   Mosie Lukes, MD  isosorbide mononitrate (IMDUR) 30 MG 24 hr tablet Take 1 tablet (30 mg total) by mouth daily. 02/09/14   Minna Merritts, MD  Krill Oil 1000 MG CAPS Take 1,000 mg by mouth daily.    Historical Provider, MD  losartan (COZAAR) 100 MG tablet Take 1 tablet (100 mg total) by mouth daily. 02/09/14   Minna Merritts, MD  montelukast (SINGULAIR) 10 MG tablet TAKE 1 TABLET BY MOUTH AT BEDTIME 07/14/14   Mosie Lukes, MD  mupirocin ointment (BACTROBAN) 2 % Place 1 application into the nose daily. Via qtip to ear daily 01/24/14   Mosie Lukes, MD  nitroGLYCERIN (NITROSTAT) 0.4 MG SL tablet Place 1 tablet (0.4 mg total) under the tongue every 5 (five) minutes as needed for chest pain. 02/09/14   Minna Merritts, MD  pantoprazole (PROTONIX) 40 MG tablet Take 1 tablet (40 mg  total) by mouth daily. 01/24/14   Mosie Lukes, MD  Polyethyl Glycol-Propyl Glycol (SYSTANE OP) Place 1 drop into both eyes daily as needed (dry eyes).    Historical Provider, MD  rosuvastatin (CRESTOR) 40 MG tablet Take 1 tablet (40 mg total) by mouth daily. 02/09/14   Minna Merritts, MD  sertraline (ZOLOFT) 50 MG tablet Take 1 tablet (50 mg total) by mouth daily. 01/24/14   Mosie Lukes, MD   BP 135/53 mmHg  Pulse 69  Temp(Src) 98.7 F (37.1 C) (Oral)  Resp 15  Ht 5\' 2"  (1.575 m)  Wt 145 lb (65.772 kg)  BMI 26.51 kg/m2  SpO2 94% Physical Exam  Constitutional: She is oriented to person, place, and time. She appears well-developed and well-nourished.  HENT:  Head: Normocephalic and atraumatic.  Right Ear: External ear normal.  Left Ear: External ear normal.  Mouth/Throat: Oropharynx is clear and moist.  Eyes: Conjunctivae and EOM are normal. Pupils are equal, round, and reactive to light.  Neck: Normal range of motion. Neck supple.  Cardiovascular: Normal rate, regular rhythm, normal heart sounds and intact distal pulses.   Pulmonary/Chest: Effort normal and breath sounds normal. No respiratory distress. She has no wheezes. She has no rales. She exhibits no tenderness.  Abdominal: Soft. Bowel sounds are normal. She exhibits no distension and no mass. There is no tenderness. There is no rebound and no guarding.  Musculoskeletal: Normal range of motion.  Neurological: She is alert and oriented to person, place, and time.  Skin: Skin is warm and dry.  Nursing note and vitals reviewed.   ED Course  Procedures (including critical care time) Labs Review Labs Reviewed  BASIC METABOLIC PANEL - Abnormal; Notable for the following:    Potassium 2.8 (*)    Glucose, Bld 116 (*)    GFR calc non Af Amer 57 (*)    GFR calc Af Amer 66 (*)    All other components within normal limits  URINALYSIS, ROUTINE W REFLEX MICROSCOPIC - Abnormal; Notable for the following:    Hgb urine dipstick  TRACE (*)  Leukocytes, UA SMALL (*)    All other components within normal limits  CBC WITH DIFFERENTIAL/PLATELET  TSH  MAGNESIUM  URINE MICROSCOPIC-ADD ON  I-STAT TROPOININ, ED  I-STAT TROPOININ, ED    Imaging Review No results found.   EKG Interpretation   Date/Time:  Saturday July 15 2014 14:57:07 EST Ventricular Rate:  65 PR Interval:  157 QRS Duration: 100 QT Interval:  432 QTC Calculation: 449 R Axis:   80 Text Interpretation:  Sinus rhythm Borderline repolarization abnormality  No significant change since last tracing Confirmed by HARRISON  MD,  FORREST ([id number]) on 07/15/2014 3:19:12 PM      MDM   Final diagnoses:  Palpitations   74 year old woman with history of chronic bradycardia, spinal stenosis, peripheral vascular disease, status post CEA on the left , followed in [place], 40-59% bilateral carotid arterial disease in December 2013, hyperlipidemia, significantly improved on Crestor, coronary artery disease and bypass surgery in 1999, stent placement to distal RCA may 2013, presents with CC of palpitations.  EMS called, and pt brought to ED.  S/p 324 mg ASA.    Physical exam as above.  VS WNL here.  Pt currently asymptomatic.  DDx palpitations, atypical presentation ACS, dehydration, electrolyte abnormality.  Will obtain CBC, BMP, Mag, TSH, UA, give NS bolus, and reeval.  3:24 PM  Labs demonstrate normal Troponin.  CBC WNL.  UA WNL.  TSH pending.  K is low 2.8.  Mag 1.7.  Question hypoK as cause of symptoms.  However in ED pt had some mild, transient CP, which resolved on own within a minute or so, so concern for atypical ACS.  Initial troponin was 0.01 however.   Cardiology on-call attending recommends delta troponin in 2 hours, and loading with K replacement 120 mEq (40 mEq q54min).  Suggests if still asymptomatic on reeval, and detal troponin WNL, should be able to go home, and follow up with her personal cardiologist.    Delta troponin was 0.00.  On  reeval pt was symptomatic.  She received total of 120 mEq KCl in ED.    Diagnosis of palpitations, hypokalemia, atypical chest pain.   Pt okay for d/c home at this time.  Pt given albuterol inhaler per her request (home inhaler almost empty).  F/u with cardiology in 1 week.  Return precautions given.  Pt understands and agrees with plan.   Sinda Du  Discussed pt with my attending Dr. Aline Brochure.   Sinda Du, MD 07/16/14 5027  Pamella Pert, MD 07/16/14 1218

## 2014-07-15 NOTE — Discharge Instructions (Signed)

## 2014-07-15 NOTE — ED Notes (Addendum)
Patient endorses intermittent episodes of palpitations starting at 10am today described as "heart beating hard inside of chest". Patients had 6-8 episodes today lasting 3-5 min each. Patient endorses diaphoresis with episodes. Patient denies blurry vision, nausea, weakness, chest pain, shortness of breath, dizziness or lightheadedness. Patient states palpated pulse was 46 & irregular. Patient sts has had episodes of palpitations in the past but not recently and was not accompanied by diaphoresis.  EKG: SB-NSR with occasional PVCs. Dr. Justin Mend at bedside.

## 2014-07-15 NOTE — ED Notes (Signed)
Per EMS pt from work c/o intermittent palpitations starting at 10am denies pain, nausea, dizziness, shortness of breath, or lightheadedness. Patients sts check pulse and at work and noted to be irregular and 46. EMS EKG- Sinus Rhythm rates between 55-76bpm. Pt denies palpitations at present time.

## 2014-07-15 NOTE — ED Notes (Signed)
Patient wheeled to restroom. 

## 2014-07-19 ENCOUNTER — Other Ambulatory Visit: Payer: Self-pay | Admitting: Family Medicine

## 2014-07-24 ENCOUNTER — Encounter: Payer: Self-pay | Admitting: Cardiovascular Disease

## 2014-07-24 ENCOUNTER — Ambulatory Visit (INDEPENDENT_AMBULATORY_CARE_PROVIDER_SITE_OTHER): Payer: PPO | Admitting: Cardiovascular Disease

## 2014-07-24 VITALS — BP 104/60 | HR 57 | Ht 62.0 in | Wt 151.0 lb

## 2014-07-24 DIAGNOSIS — E785 Hyperlipidemia, unspecified: Secondary | ICD-10-CM

## 2014-07-24 DIAGNOSIS — I6521 Occlusion and stenosis of right carotid artery: Secondary | ICD-10-CM

## 2014-07-24 DIAGNOSIS — E876 Hypokalemia: Secondary | ICD-10-CM

## 2014-07-24 DIAGNOSIS — R0602 Shortness of breath: Secondary | ICD-10-CM

## 2014-07-24 DIAGNOSIS — I25810 Atherosclerosis of coronary artery bypass graft(s) without angina pectoris: Secondary | ICD-10-CM

## 2014-07-24 DIAGNOSIS — R000 Tachycardia, unspecified: Secondary | ICD-10-CM

## 2014-07-24 DIAGNOSIS — I10 Essential (primary) hypertension: Secondary | ICD-10-CM

## 2014-07-24 MED ORDER — POTASSIUM CHLORIDE ER 10 MEQ PO TBCR: 10.0000 meq | EXTENDED_RELEASE_TABLET | Freq: Every day | ORAL | Status: DC | PRN

## 2014-07-24 NOTE — Assessment & Plan Note (Signed)
Cholesterol is at goal on the current lipid regimen. No changes to the medications were made.  

## 2014-07-24 NOTE — Progress Notes (Signed)
[name], female    DOB: 1940/11/08, 74 y.o.   MRN: [id number]  HPI Comments: Andrea [name] is a pleasant 74 year old woman with history of chronic bradycardia,  spinal stenosis, peripheral vascular disease, status post CEA on the left ,  40-59% residual disease on the right,   hyperlipidemia, significantly improved on Crestor, coronary artery disease and bypass surgery in 1999, stent placement to distal RCA may 2013, who presents for routine followup  Of her coronary artery disease .  In follow-up she reports that she was recently in the emergency room several days ago for palpitations, tachycardia. She had been feeling well up until then apart from some significant leg cramping that had been going on for several weeks worse at nighttime Lab work showed potassium 2.8. She reports that she was given 6 or 7 potassium pills to take. No prescription provided at discharge. She has been taking a banana every day since then. She reports blood pressure is typically very well controlled  EKG on today's visit shows normal sinus rhythm with rate 57 bpm, diffuse T-wave abnormality, unchanged from prior EKGs  Overall she states that she is doing well. She is tolerating her Crestor 40 mg daily. On this her cholesterol has significantly improved now down to within a excellent range. Diet has been better.chronic mild shortness of breath with exertion. Chronic back pain    Past Medical History   Diagnosis  Date   .  COPD (chronic obstructive pulmonary disease)     .  Cerebrovascular disease  01/2009       carotid u/s  R 0-39%   L 60-79%   .  CAD (coronary artery disease)         s/CABG (reports IMA and 2 SVGs) back in 1999  Myoview normal 3/10; s/p PCI with DES to PL branch of distal RCA 09/2011   .  Depression     .  GERD (gastroesophageal reflux disease)     .  Hypertension     .  Hyperlipidemia     .  Osteoporosis     .  PVD (peripheral vascular disease)     .  Dizziness     .  AAA  (abdominal aortic aneurysm)  01/2009       AAA (2.8 x 3.0)  moderate RAS (left); 2.7 x 2.7 cm (07/11/05)   .  NSTEMI (non-ST elevated myocardial infarction)  11/12       Cath showed atretic IMA graft to the LAD, SVG to PD was patent but the continuation of this graft to the PL branch was occluded; there were L to R collaterals; Mid LAD had a 60 to 70% stenosis. She has been treated medically.  Neg Myoview 05/2011   .  Bradycardia         Metoprolol stopped 08/2011   .  Angina     .  Dysrhythmia         hx of sinus brady   .  Shortness of breath     .  Blood transfusion         hx of transfusion    .  Herniated lumbar disc without myelopathy       Past Surgical History   Procedure  Laterality  Date   .  Coronary artery bypass graft    1999   .  Tubal ligation           .  Femoral artery stent       .  Closed reduction nasal fracture    11/2007   .  Carotid endarterectomy    01/02/11       left   .  Coronary angioplasty with stent placement    10/10/2011       drug eluting  to rc & saphenous     Allergies  Allergen Reactions  . Ciprofloxacin     REACTION: rash IV  . Codeine     REACTION: nausea/vomiting  . Erythromycin     REACTION: tongue burns  . Meperidine Hcl     REACTION: Nausea/vomiting  . Penicillins     REACTION: rash  . Shellfish Allergy Nausea And Vomiting  . Tape Rash    Outpatient Encounter Prescriptions as of 07/24/2014  Medication Sig  . acetaminophen (TYLENOL) 650 MG CR tablet Take 1,300 mg by mouth 2 (two) times daily.  Marland Kitchen albuterol (PROVENTIL HFA;VENTOLIN HFA) 108 (90 BASE) MCG/ACT inhaler Inhale 2 puffs into the lungs every 6 (six) hours as needed for wheezing.  Marland Kitchen aspirin 81 MG tablet Take 81 mg by mouth daily.  . Calcium Carbonate-Vitamin D (CALCIUM + D PO) Take 1 tablet by mouth 2 (two) times daily.  . clopidogrel (PLAVIX) 75 MG tablet Take 1 tablet (75 mg total) by mouth daily.  . diclofenac sodium (VOLTAREN) 1 % GEL Apply 2 g topically 3 (three) times daily  as needed (pain).  . fluticasone (FLONASE) 50 MCG/ACT nasal spray Place 2 sprays into the nose 2 (two) times daily as needed. For congestion  . hydrochlorothiazide (HYDRODIURIL) 25 MG tablet Take 1 tablet (25 mg total) by mouth every morning.  Marland Kitchen HYDROcodone-acetaminophen (NORCO/VICODIN) 5-325 MG per tablet Take 1 tablet by mouth 2 (two) times daily as needed for pain.  . isosorbide mononitrate (IMDUR) 30 MG 24 hr tablet Take 1 tablet (30 mg total) by mouth daily.  Javier Docker Oil 1000 MG CAPS Take 1,000 mg by mouth daily.  Marland Kitchen losartan (COZAAR) 100 MG tablet Take 1 tablet (100 mg total) by mouth daily.  . montelukast (SINGULAIR) 10 MG tablet TAKE 1 TABLET BY MOUTH AT BEDTIME  . mupirocin ointment (BACTROBAN) 2 % Place 1 application into the nose daily. Via qtip to ear daily  . nitroGLYCERIN (NITROSTAT) 0.4 MG SL tablet Place 1 tablet (0.4 mg total) under the tongue every 5 (five) minutes as needed for chest pain.  . pantoprazole (PROTONIX) 40 MG tablet Take 1 tablet (40 mg total) by mouth daily.  Vladimir Faster Glycol-Propyl Glycol (SYSTANE OP) Place 1 drop into both eyes daily as needed (dry eyes).  . rosuvastatin (CRESTOR) 40 MG tablet Take 1 tablet (40 mg total) by mouth daily.  . sertraline (ZOLOFT) 50 MG tablet Take 1 tablet (50 mg total) by mouth daily.  . SYMBICORT 160-4.5 MCG/ACT inhaler INHALE 2 PUFFS INTO THE LUNGS 2 (TWO) TIMES DAILY.  Marland Kitchen potassium chloride (K-DUR) 10 MEQ tablet Take 1 tablet (10 mEq total) by mouth daily as needed.  . [DISCONTINUED] cefdinir (OMNICEF) 300 MG capsule Take 1 capsule (300 mg total) by mouth 2 (two) times daily. (Patient not taking: Reported on 07/24/2014)    Past Medical History  Diagnosis Date  . COPD (chronic obstructive pulmonary disease)   . Cerebrovascular disease 01/2009    carotid u/s  R 0-39%   L 60-79%  . CAD (coronary artery disease)     s/CABG (reports IMA and 2 SVGs) back in 1999  Myoview normal 3/10; s/p PCI with DES to PL branch of distal RCA  09/2011  . Depression   . GERD (gastroesophageal reflux disease)   . Hypertension   . Hyperlipidemia   . Osteoporosis   . PVD (peripheral vascular disease)   . Dizziness   . AAA (abdominal aortic aneurysm) 01/2009    AAA (2.8 x 3.0)  moderate RAS (left); 2.7 x 2.7 cm (07/11/05)  . NSTEMI (non-ST elevated myocardial infarction) 11/12    Cath showed atretic IMA graft to the LAD, SVG to PD was patent but the continuation of this graft to the PL branch was occluded; there were L to R collaterals; Mid LAD had a 60 to 70% stenosis. She has been treated medically.  Neg Myoview 05/2011  . Bradycardia     Metoprolol stopped 08/2011  . Angina   . Dysrhythmia     hx of sinus brady  . Shortness of breath   . Blood transfusion     hx of transfusion   . Herniated lumbar disc without myelopathy   . Anosmia   . Acute bronchitis 03/20/2013  . Medicare annual wellness visit, subsequent 07/31/2013  . Thoracic back pain 01/24/2014  . Baker's cyst of knee 01/30/2014    Past Surgical History  Procedure Laterality Date  . Coronary artery bypass graft  1999  . Tubal ligation    . Abdominal aortic aneurysm repair    . Femoral artery stent    . Closed reduction nasal fracture  11/2007  . Carotid endarterectomy  01/02/11    left  . Coronary angioplasty with stent placement  10/10/2011    drug eluting  to rc & saphenous  . Nasal sinus surgery      twice  . Left heart catheterization with coronary/graft angiogram N/A 04/22/2011    Procedure: LEFT HEART CATHETERIZATION WITH Beatrix Fetters;  Surgeon: Jolaine Artist, MD;  Location: [facility] CATH LAB;  Service: Cardiovascular;  Laterality: N/A;  . Left heart catheterization with coronary/graft angiogram N/A 10/10/2011    Procedure: LEFT HEART CATHETERIZATION WITH Beatrix Fetters;  Surgeon: Peter M Martinique, MD;  Location: [facility] CATH LAB;  Service: Cardiovascular;  Laterality: N/A;    Social History  reports that she quit smoking about 21 years ago. Her  smoking use included Cigarettes. She has a 20 pack-year smoking history. She has never used smokeless tobacco. She reports that she does not drink alcohol or use illicit drugs.  Family History family history includes Alcohol abuse in her brother; Aneurysm in her brother; Cancer in her father and sister; Cirrhosis in her maternal grandfather; Colon cancer (age of onset: 38) in her father; Diabetes in her maternal grandmother and mother; Heart attack in her brother and brother; Heart attack (age of onset: 97) in her mother; Heart disease in her brother, brother, and father; Hyperlipidemia in her daughter; Hypertension in her brother and father; Hypothyroidism in her sister; Lung cancer in her father and sister; Obesity in her daughter and son; Stroke in her maternal grandmother and paternal grandmother.        Review of Systems  Constitutional: Negative.   Respiratory: Negative.   Cardiovascular: Positive for chest pain.  Gastrointestinal: Negative.   Musculoskeletal: Positive for back pain.  Skin: Negative.   Neurological: Negative.   Hematological: Negative.   Psychiatric/Behavioral: Negative.   All other systems reviewed and are negative.   BP 104/60 mmHg  Pulse 57  Ht 5\' 2"  (1.575 m)  Wt 151 lb (68.493 kg)  BMI 27.61 kg/m2  Physical Exam  Constitutional: She is oriented to person, place, and  time. She appears well-developed and well-nourished.  HENT:  Head: Normocephalic.  Nose: Nose normal.  Mouth/Throat: Oropharynx is clear and moist.  Eyes: Conjunctivae are normal. Pupils are equal, round, and reactive to light.  Neck: Normal range of motion. Neck supple. No JVD present.  Cardiovascular: Normal rate, regular rhythm, S1 normal, S2 normal and intact distal pulses.  Exam reveals no gallop and no friction rub.   Murmur heard.  Systolic murmur is present with a grade of 2/6  Pulmonary/Chest: Effort normal and breath sounds normal. No respiratory distress. She has no wheezes.  She has no rales. She exhibits no tenderness.  Abdominal: Soft. Bowel sounds are normal. She exhibits no distension. There is no tenderness.  Musculoskeletal: Normal range of motion. She exhibits no edema or tenderness.  Lymphadenopathy:    She has no cervical adenopathy.  Neurological: She is alert and oriented to person, place, and time. Coordination normal.  Skin: Skin is warm and dry. No rash noted. No erythema.  Psychiatric: She has a normal mood and affect. Her behavior is normal. Judgment and thought content normal.    Assessment and Plan  Nursing note and vitals reviewed.

## 2014-07-24 NOTE — Assessment & Plan Note (Signed)
Currently with no symptoms of angina. No further workup at this time. Continue current medication regimen. 

## 2014-07-24 NOTE — Assessment & Plan Note (Signed)
Stable, active. No further testing

## 2014-07-24 NOTE — Assessment & Plan Note (Signed)
40 to 59% on the right Repeat u/s later in 2016

## 2014-07-24 NOTE — Assessment & Plan Note (Signed)
Recommended that she decrease her HCTZ down to one half pill daily, monitor her blood pressure. Take HCTZ with either banana or potassium Recheck blood work with primary care

## 2014-07-24 NOTE — Patient Instructions (Addendum)
You are doing well.  Consider decreasing the HCTZ down to a 1/2 pill every other day or every day if blood pressure runs ok  Please take a potassium pill if you don't take a banana  Please call us if you have new issues that need to be addressed before your next appt.  Your physician wants you to follow-up in: 6 months.  You will receive a reminder letter in the mail two months in advance. If you don't receive a letter, please call our office to schedule the follow-up appointment.

## 2014-07-24 NOTE — Assessment & Plan Note (Signed)
Low at 2.8 recently. Recommended she take potassium daily, recheck labs with PMD

## 2014-07-26 ENCOUNTER — Other Ambulatory Visit: Payer: Self-pay | Admitting: Family Medicine

## 2014-07-26 NOTE — Telephone Encounter (Signed)
Last filled: 01/24/14 Amt:90, 1 Last OV: 01/24/14  Med filled x 3 months.

## 2014-07-31 ENCOUNTER — Ambulatory Visit: Payer: Commercial Managed Care - HMO | Admitting: Family Medicine

## 2014-10-22 ENCOUNTER — Other Ambulatory Visit: Payer: Self-pay | Admitting: Family Medicine

## 2014-10-22 NOTE — Telephone Encounter (Signed)
I have refilled her Sertraline for 90 days but by September it will have been a year since we saw her so she will need an appt by them

## 2014-11-20 ENCOUNTER — Other Ambulatory Visit: Payer: Self-pay | Admitting: Family Medicine

## 2014-12-17 ENCOUNTER — Other Ambulatory Visit: Payer: Self-pay | Admitting: Cardiovascular Disease

## 2015-01-17 ENCOUNTER — Other Ambulatory Visit: Payer: Self-pay | Admitting: Family Medicine

## 2015-01-30 ENCOUNTER — Encounter: Payer: Self-pay | Admitting: Internal Medicine

## 2015-02-08 ENCOUNTER — Ambulatory Visit (HOSPITAL_BASED_OUTPATIENT_CLINIC_OR_DEPARTMENT_OTHER)
Admission: RE | Admit: 2015-02-08 | Discharge: 2015-02-08 | Disposition: A | Discharge status: Home / Self Care | Payer: PPO | Source: Ambulatory Visit | Attending: Family Medicine | Admitting: Family Medicine

## 2015-02-08 ENCOUNTER — Encounter: Payer: Self-pay | Admitting: Family Medicine

## 2015-02-08 ENCOUNTER — Ambulatory Visit (INDEPENDENT_AMBULATORY_CARE_PROVIDER_SITE_OTHER): Payer: PPO | Admitting: Family Medicine

## 2015-02-08 VITALS — BP 118/70 | HR 80 | Temp 97.9°F | Ht 62.0 in | Wt 159.0 lb

## 2015-02-08 DIAGNOSIS — R739 Hyperglycemia, unspecified: Secondary | ICD-10-CM

## 2015-02-08 DIAGNOSIS — R918 Other nonspecific abnormal finding of lung field: Secondary | ICD-10-CM | POA: Insufficient documentation

## 2015-02-08 DIAGNOSIS — K219 Gastro-esophageal reflux disease without esophagitis: Secondary | ICD-10-CM

## 2015-02-08 DIAGNOSIS — I214 Non-ST elevation (NSTEMI) myocardial infarction: Secondary | ICD-10-CM | POA: Diagnosis not present

## 2015-02-08 DIAGNOSIS — R0600 Dyspnea, unspecified: Secondary | ICD-10-CM

## 2015-02-08 DIAGNOSIS — I10 Essential (primary) hypertension: Secondary | ICD-10-CM

## 2015-02-08 DIAGNOSIS — M545 Low back pain: Secondary | ICD-10-CM | POA: Diagnosis not present

## 2015-02-08 DIAGNOSIS — E785 Hyperlipidemia, unspecified: Secondary | ICD-10-CM | POA: Diagnosis not present

## 2015-02-08 DIAGNOSIS — J209 Acute bronchitis, unspecified: Secondary | ICD-10-CM

## 2015-02-08 DIAGNOSIS — R0602 Shortness of breath: Secondary | ICD-10-CM | POA: Insufficient documentation

## 2015-02-08 MED ORDER — BENZONATATE 100 MG PO CAPS: 100.0000 mg | ORAL_CAPSULE | Freq: Three times a day (TID) | ORAL | Status: DC | PRN

## 2015-02-08 MED ORDER — SERTRALINE HCL 50 MG PO TABS: ORAL_TABLET | ORAL | Status: DC

## 2015-02-08 MED ORDER — SPACER/AERO-HOLDING CHAMBERS DEVI: Status: DC

## 2015-02-08 MED ORDER — SULFAMETHOXAZOLE-TRIMETHOPRIM 800-160 MG PO TABS: 1.0000 | ORAL_TABLET | Freq: Two times a day (BID) | ORAL | Status: DC

## 2015-02-08 NOTE — Patient Instructions (Signed)

## 2015-02-08 NOTE — Progress Notes (Signed)
Pre visit review using our clinic review tool, if applicable. No additional management support is needed unless otherwise documented below in the visit note. 

## 2015-02-09 LAB — COMPREHENSIVE METABOLIC PANEL
ALBUMIN: 4.2 g/dL (ref 3.5–5.2)
ALT: 13 U/L (ref 0–35)
AST: 18 U/L (ref 0–37)
Alkaline Phosphatase: 52 U/L (ref 39–117)
BUN: 16 mg/dL (ref 6–23)
CHLORIDE: 99 meq/L (ref 96–112)
CO2: 34 mEq/L — ABNORMAL HIGH (ref 19–32)
Calcium: 9.8 mg/dL (ref 8.4–10.5)
Creatinine, Ser: 1.04 mg/dL (ref 0.40–1.20)
GFR: 54.77 mL/min — ABNORMAL LOW (ref >60.00)
Glucose, Bld: 81 mg/dL (ref 70–99)
POTASSIUM: 3.9 meq/L (ref 3.5–5.1)
SODIUM: 141 meq/L (ref 135–145)
Total Bilirubin: 0.7 mg/dL (ref 0.2–1.2)
Total Protein: 7.1 g/dL (ref 6.0–8.3)

## 2015-02-09 LAB — LIPID PANEL
CHOL/HDL RATIO: 5
Cholesterol: 144 mg/dL (ref 0–200)
HDL: 30.9 mg/dL — ABNORMAL LOW (ref >39.00)
NonHDL: 113.18
Triglycerides: 330 mg/dL — ABNORMAL HIGH (ref 0.0–149.0)
VLDL: 66 mg/dL — AB (ref 0.0–40.0)

## 2015-02-09 LAB — URINALYSIS
BILIRUBIN URINE: NEGATIVE
Ketones, ur: NEGATIVE
Leukocytes, UA: NEGATIVE
NITRITE: NEGATIVE
Specific Gravity, Urine: 1.01 (ref 1.000–1.030)
Total Protein, Urine: NEGATIVE
Urine Glucose: NEGATIVE
Urobilinogen, UA: 0.2 (ref 0.0–1.0)
pH: 6 (ref 5.0–8.0)

## 2015-02-09 LAB — URINE CULTURE
Colony Count: NO GROWTH
Organism ID, Bacteria: NO GROWTH

## 2015-02-09 LAB — CBC
HCT: 43 % (ref 36.0–46.0)
Hemoglobin: 14.6 g/dL (ref 12.0–15.0)
MCHC: 33.9 g/dL (ref 30.0–36.0)
MCV: 86.1 fl (ref 78.0–100.0)
PLATELETS: 210 10*3/uL (ref 150.0–400.0)
RBC: 5 Mil/uL (ref 3.87–5.11)
RDW: 16.8 % — ABNORMAL HIGH (ref 11.5–15.5)
WBC: 9.4 10*3/uL (ref 4.0–10.5)

## 2015-02-09 LAB — LDL CHOLESTEROL, DIRECT: LDL DIRECT: 83 mg/dL

## 2015-02-09 LAB — TSH: TSH: 2.91 u[IU]/mL (ref 0.35–4.50)

## 2015-02-09 LAB — HEMOGLOBIN A1C: HEMOGLOBIN A1C: 6.1 % (ref 4.6–6.5)

## 2015-02-15 ENCOUNTER — Other Ambulatory Visit: Payer: Self-pay | Admitting: Family Medicine

## 2015-02-18 NOTE — Assessment & Plan Note (Signed)
Well controlled, no changes to meds. Encouraged heart healthy diet such as the DASH diet and exercise as tolerated.  °

## 2015-02-18 NOTE — Assessment & Plan Note (Signed)
Tolerating statin, encouraged heart healthy diet, avoid trans fats, minimize simple carbs and saturated fats. Increase exercise as tolerated. Increase crestor

## 2015-02-18 NOTE — Progress Notes (Signed)
Subjective:    [name], female    DOB: 01-08-1941, 74 y.o.   MRN: [id number]  Chief Complaint  Patient presents with  . Breathing Problem    Increased use of inhaler    HPI Patient is in today for evaluation of worsening respiratory symptoms. She has increased her Symbicort over the last couple months but over the last week she's having worsening shortness of breath coughing and wheezing. No fevers or chills. She has just recently finished courses towards given to her by Dr. Constance Holster of [facility] for sinusitis. Her symptoms were somewhat better while taking that medication. No GI or GU complaints. No CP/palp  Past Medical History  Diagnosis Date  . COPD (chronic obstructive pulmonary disease)   . Cerebrovascular disease 01/2009    carotid u/s  R 0-39%   L 60-79%  . CAD (coronary artery disease)     s/CABG (reports IMA and 2 SVGs) back in 1999  Myoview normal 3/10; s/p PCI with DES to PL branch of distal RCA 09/2011  . Depression   . GERD (gastroesophageal reflux disease)   . Hypertension   . Hyperlipidemia   . Osteoporosis   . PVD (peripheral vascular disease)   . Dizziness   . AAA (abdominal aortic aneurysm) 01/2009    AAA (2.8 x 3.0)  moderate RAS (left); 2.7 x 2.7 cm (07/11/05)  . NSTEMI (non-ST elevated myocardial infarction) 11/12    Cath showed atretic IMA graft to the LAD, SVG to PD was patent but the continuation of this graft to the PL branch was occluded; there were L to R collaterals; Mid LAD had a 60 to 70% stenosis. She has been treated medically.  Neg Myoview 05/2011  . Bradycardia     Metoprolol stopped 08/2011  . Angina   . Dysrhythmia     hx of sinus brady  . Shortness of breath   . Blood transfusion     hx of transfusion   . Herniated lumbar disc without myelopathy   . Anosmia   . Acute bronchitis 03/20/2013  . Medicare annual wellness visit, subsequent 07/31/2013  . Thoracic back pain 01/24/2014  . Baker's cyst of knee 01/30/2014    Past  Surgical History  Procedure Laterality Date  . Coronary artery bypass graft  1999  . Tubal ligation    . Abdominal aortic aneurysm repair    . Femoral artery stent    . Closed reduction nasal fracture  11/2007  . Carotid endarterectomy  01/02/11    left  . Coronary angioplasty with stent placement  10/10/2011    drug eluting  to rc & saphenous  . Nasal sinus surgery      twice  . Left heart catheterization with coronary/graft angiogram N/A 04/22/2011    Procedure: LEFT HEART CATHETERIZATION WITH Beatrix Fetters;  Surgeon: Jolaine Artist, MD;  Location: [facility] CATH LAB;  Service: Cardiovascular;  Laterality: N/A;  . Left heart catheterization with coronary/graft angiogram N/A 10/10/2011    Procedure: LEFT HEART CATHETERIZATION WITH Beatrix Fetters;  Surgeon: Peter M Martinique, MD;  Location: [facility] CATH LAB;  Service: Cardiovascular;  Laterality: N/A;    Family History  Problem Relation Age of Onset  . Heart attack Mother 44  . Diabetes Mother   . Colon cancer Father 40  . Lung cancer Father     smoked  . Heart disease Father     angina  . Hypertension Father   . Cancer Father  colon, lung  . Lung cancer Sister     smoked  . Cancer Sister   . Hypothyroidism Sister   . Hypertension Brother   . Heart attack Brother   . Heart disease Brother     stent  . Heart attack Brother     CABG  . Heart disease Brother     CABG with 1 bypass  . Aneurysm Brother     brain  . Alcohol abuse Brother   . Hyperlipidemia Daughter   . Diabetes Maternal Grandmother   . Stroke Maternal Grandmother   . Cirrhosis Maternal Grandfather   . Stroke Paternal Grandmother   . Obesity Daughter   . Obesity Son     Social History   Social History  . Marital Status: Married    Spouse Name: N/A  . Number of Children: 5  . Years of Education: N/A   Occupational History  . Retired     former  Marine scientist   Social History Main Topics  . Smoking status: Former Smoker -- 0.50 packs/day for  40 years    Types: Cigarettes    Quit date: 01/19/1993  . Smokeless tobacco: Never Used  . Alcohol Use: No     Comment: rare  . Drug Use: No  . Sexual Activity: No   Other Topics Concern  . Not on file   Social History Narrative   Married with 5 children    Outpatient Prescriptions Prior to Visit  Medication Sig Dispense Refill  . acetaminophen (TYLENOL) 650 MG CR tablet Take 1,300 mg by mouth 2 (two) times daily.    Marland Kitchen albuterol (PROVENTIL HFA;VENTOLIN HFA) 108 (90 BASE) MCG/ACT inhaler Inhale 2 puffs into the lungs every 6 (six) hours as needed for wheezing. 1 Inhaler 2  . aspirin 81 MG tablet Take 81 mg by mouth daily.    . Calcium Carbonate-Vitamin D (CALCIUM + D PO) Take 1 tablet by mouth 2 (two) times daily.    . clopidogrel (PLAVIX) 75 MG tablet Take 1 tablet (75 mg total) by mouth daily. 90 tablet 3  . diclofenac sodium (VOLTAREN) 1 % GEL Apply 2 g topically 3 (three) times daily as needed (pain).    . fluticasone (FLONASE) 50 MCG/ACT nasal spray Place 2 sprays into the nose 2 (two) times daily as needed. For congestion    . hydrochlorothiazide (HYDRODIURIL) 25 MG tablet Take 1 tablet (25 mg total) by mouth every morning. 30 tablet 6  . hydrochlorothiazide (HYDRODIURIL) 25 MG tablet TAKE 1 TABLET (25 MG TOTAL) BY MOUTH EVERY MORNING. 30 tablet 6  . HYDROcodone-acetaminophen (NORCO/VICODIN) 5-325 MG per tablet Take 1 tablet by mouth 2 (two) times daily as needed for pain. 30 tablet 1  . isosorbide mononitrate (IMDUR) 30 MG 24 hr tablet Take 1 tablet (30 mg total) by mouth daily. 90 tablet 3  . Krill Oil 1000 MG CAPS Take 1,000 mg by mouth daily.    Marland Kitchen losartan (COZAAR) 100 MG tablet Take 1 tablet (100 mg total) by mouth daily. 90 tablet 3  . montelukast (SINGULAIR) 10 MG tablet TAKE 1 TABLET BY MOUTH AT BEDTIME 30 tablet 6  . mupirocin ointment (BACTROBAN) 2 % Place 1 application into the nose daily. Via qtip to ear daily 22 g 0  . nitroGLYCERIN (NITROSTAT) 0.4 MG SL tablet  Place 1 tablet (0.4 mg total) under the tongue every 5 (five) minutes as needed for chest pain. 25 tablet 6  . Polyethyl Glycol-Propyl Glycol (SYSTANE OP) Place 1  drop into both eyes daily as needed (dry eyes).    . potassium chloride (K-DUR) 10 MEQ tablet Take 1 tablet (10 mEq total) by mouth daily as needed. 90 tablet 3  . rosuvastatin (CRESTOR) 40 MG tablet Take 1 tablet (40 mg total) by mouth daily. 90 tablet 3  . SYMBICORT 160-4.5 MCG/ACT inhaler INHALE 2 PUFFS INTO THE LUNGS 2 (TWO) TIMES DAILY. 10.2 Inhaler 2  . pantoprazole (PROTONIX) 40 MG tablet Take 1 tablet (40 mg total) by mouth daily. 90 tablet 3  . sertraline (ZOLOFT) 50 MG tablet TAKE 1 TABLET EVERY DAY **NEED OFFICE VISIT 90 tablet 0  . SYMBICORT 160-4.5 MCG/ACT inhaler INHALE 2 PUFFS INTO THE LUNGS 2 (TWO) TIMES DAILY. 10.2 Inhaler 4   No facility-administered medications prior to visit.    Allergies  Allergen Reactions  . Ciprofloxacin     REACTION: rash IV  . Codeine     REACTION: nausea/vomiting  . Erythromycin     REACTION: tongue burns  . Meperidine Hcl     REACTION: Nausea/vomiting  . Penicillins     REACTION: rash  . Shellfish Allergy Nausea And Vomiting  . Tape Rash    Review of Systems  Constitutional: Negative for fever and malaise/fatigue.  HENT: Positive for congestion.   Eyes: Negative for discharge.  Respiratory: Positive for cough, sputum production and shortness of breath.   Cardiovascular: Negative for chest pain, palpitations and leg swelling.  Gastrointestinal: Negative for nausea and abdominal pain.  Genitourinary: Negative for dysuria.  Musculoskeletal: Negative for falls.  Skin: Negative for rash.  Neurological: Negative for loss of consciousness and headaches.  Endo/Heme/Allergies: Negative for environmental allergies.  Psychiatric/Behavioral: Negative for depression. The patient is nervous/anxious.        Objective:    Physical Exam  Constitutional: She is oriented to person,  place, and time. She appears well-developed and well-nourished. No distress.  HENT:  Head: Normocephalic and atraumatic.  Nose: Nose normal.  Eyes: Right eye exhibits no discharge. Left eye exhibits no discharge.  Neck: Normal range of motion. Neck supple.  Cardiovascular: Normal rate and regular rhythm.   Murmur heard. Pulmonary/Chest: Effort normal. She has wheezes.  Abdominal: Soft. Bowel sounds are normal. There is no tenderness.  Musculoskeletal: She exhibits no edema.  Neurological: She is alert and oriented to person, place, and time.  Skin: Skin is warm and dry.  Psychiatric: She has a normal mood and affect.  Nursing note and vitals reviewed.   BP 118/70 mmHg  Pulse 80  Temp(Src) 97.9 F (36.6 C) (Oral)  Ht '5\' 2"'$  (1.575 m)  Wt 159 lb (72.122 kg)  BMI 29.07 kg/m2  SpO2 95% Wt Readings from Last 3 Encounters:  02/08/15 159 lb (72.122 kg)  07/24/14 151 lb (68.493 kg)  07/15/14 145 lb (65.772 kg)     Lab Results  Component Value Date   WBC 9.4 02/08/2015   HGB 14.6 02/08/2015   HCT 43.0 02/08/2015   PLT 210.0 02/08/2015   GLUCOSE 81 02/08/2015   CHOL 144 02/08/2015   TRIG 330.0* 02/08/2015   HDL 30.90* 02/08/2015   LDLDIRECT 83.0 02/08/2015   LDLCALC 75 01/24/2014   ALT 13 02/08/2015   AST 18 02/08/2015   NA 141 02/08/2015   K 3.9 02/08/2015   CL 99 02/08/2015   CREATININE 1.04 02/08/2015   BUN 16 02/08/2015   CO2 34* 02/08/2015   TSH 2.91 02/08/2015   INR 1.0 10/08/2011   HGBA1C 6.1 02/08/2015  Lab Results  Component Value Date   TSH 2.91 02/08/2015   Lab Results  Component Value Date   WBC 9.4 02/08/2015   HGB 14.6 02/08/2015   HCT 43.0 02/08/2015   MCV 86.1 02/08/2015   PLT 210.0 02/08/2015   Lab Results  Component Value Date   NA 141 02/08/2015   K 3.9 02/08/2015   CO2 34* 02/08/2015   GLUCOSE 81 02/08/2015   BUN 16 02/08/2015   CREATININE 1.04 02/08/2015   BILITOT 0.7 02/08/2015   ALKPHOS 52 02/08/2015   AST 18 02/08/2015    ALT 13 02/08/2015   PROT 7.1 02/08/2015   ALBUMIN 4.2 02/08/2015   CALCIUM 9.8 02/08/2015   ANIONGAP 10 07/15/2014   GFR 54.77* 02/08/2015   Lab Results  Component Value Date   CHOL 144 02/08/2015   Lab Results  Component Value Date   HDL 30.90* 02/08/2015   Lab Results  Component Value Date   LDLCALC 75 01/24/2014   Lab Results  Component Value Date   TRIG 330.0* 02/08/2015   Lab Results  Component Value Date   CHOLHDL 5 02/08/2015   Lab Results  Component Value Date   HGBA1C 6.1 02/08/2015       Assessment & Plan:   Problem List Items Addressed This Visit    NSTEMI (non-ST elevated myocardial infarction)   Relevant Medications   sulfamethoxazole-trimethoprim (BACTRIM DS,SEPTRA DS) 800-160 MG per tablet   Other Relevant Orders   TSH   CBC   Hemoglobin A1c   Urine culture (Completed)   Lipid panel   Urinalysis (Completed)   Comprehensive metabolic panel   Echocardiogram   DG Chest 2 View (Completed)   Hyperlipidemia - Primary    Tolerating statin, encouraged heart healthy diet, avoid trans fats, minimize simple carbs and saturated fats. Increase exercise as tolerated. Increase crestor      Relevant Medications   sulfamethoxazole-trimethoprim (BACTRIM DS,SEPTRA DS) 800-160 MG per tablet   Other Relevant Orders   TSH   CBC   Hemoglobin A1c   Urine culture (Completed)   Lipid panel   Urinalysis (Completed)   Comprehensive metabolic panel   Echocardiogram   DG Chest 2 View (Completed)   GERD    Avoid offending foods, start probiotics. Do not eat large meals in late evening and consider raising head of bed. Continue current meds      Essential hypertension    Well controlled, no changes to meds. Encouraged heart healthy diet such as the DASH diet and exercise as tolerated.       Relevant Medications   sulfamethoxazole-trimethoprim (BACTRIM DS,SEPTRA DS) 800-160 MG per tablet   Other Relevant Orders   TSH   CBC   Hemoglobin A1c   Urine culture  (Completed)   Lipid panel   Urinalysis (Completed)   Comprehensive metabolic panel   Echocardiogram   DG Chest 2 View (Completed)   BACK PAIN, LUMBAR   Relevant Medications   sulfamethoxazole-trimethoprim (BACTRIM DS,SEPTRA DS) 800-160 MG per tablet   Other Relevant Orders   TSH   CBC   Hemoglobin A1c   Urine culture (Completed)   Lipid panel   Urinalysis (Completed)   Comprehensive metabolic panel   Echocardiogram   DG Chest 2 View (Completed)   Acute bronchitis    Continue Symbicort and add TMP/SMX, mucinex and probiotics      Relevant Medications   sulfamethoxazole-trimethoprim (BACTRIM DS,SEPTRA DS) 800-160 MG per tablet   Spacer/Aero-Holding Dorise Bullion   Other Relevant Orders  TSH   CBC   Hemoglobin A1c   Urine culture (Completed)   Lipid panel   Urinalysis (Completed)   Comprehensive metabolic panel   Echocardiogram   DG Chest 2 View (Completed)    Other Visit Diagnoses    Dyspnea        Relevant Medications    sulfamethoxazole-trimethoprim (BACTRIM DS,SEPTRA DS) 800-160 MG per tablet    Other Relevant Orders    TSH    CBC    Hemoglobin A1c    Urine culture (Completed)    Lipid panel    Urinalysis (Completed)    Comprehensive metabolic panel    Echocardiogram    DG Chest 2 View (Completed)    Hyperglycemia        Relevant Medications    sulfamethoxazole-trimethoprim (BACTRIM DS,SEPTRA DS) 800-160 MG per tablet    Other Relevant Orders    TSH    CBC    Hemoglobin A1c    Urine culture (Completed)    Lipid panel    Urinalysis (Completed)    Comprehensive metabolic panel    Echocardiogram    DG Chest 2 View (Completed)       I am having Ms. Conry start on sulfamethoxazole-trimethoprim, benzonatate, and Spacer/Aero-Holding Chambers. I am also having her maintain her Calcium Carbonate-Vitamin D (CALCIUM + D PO), fluticasone, acetaminophen, HYDROcodone-acetaminophen, diclofenac sodium, Polyethyl Glycol-Propyl Glycol (SYSTANE OP), Krill Oil,  albuterol, mupirocin ointment, nitroGLYCERIN, isosorbide mononitrate, losartan, rosuvastatin, clopidogrel, hydrochlorothiazide, aspirin, montelukast, SYMBICORT, potassium chloride, hydrochlorothiazide, and sertraline.  Meds ordered this encounter  Medications  . sulfamethoxazole-trimethoprim (BACTRIM DS,SEPTRA DS) 800-160 MG per tablet    Sig: Take 1 tablet by mouth 2 (two) times daily.    Dispense:  20 tablet    Refill:  0  . benzonatate (TESSALON) 100 MG capsule    Sig: Take 1 capsule (100 mg total) by mouth 3 (three) times daily as needed for cough.    Dispense:  20 capsule    Refill:  0  . sertraline (ZOLOFT) 50 MG tablet    Sig: TAKE 1 TABLET EVERY DAY **NEED OFFICE VISIT    Dispense:  90 tablet    Refill:  1  . Spacer/Aero-Holding Chambers DEVI    Sig: 1 spacer to use prn with HFA device as directed    Dispense:  1 each    Refill:  0     [name], MD

## 2015-02-18 NOTE — Assessment & Plan Note (Signed)
Avoid offending foods, start probiotics. Do not eat large meals in late evening and consider raising head of bed. Continue current meds 

## 2015-02-18 NOTE — Assessment & Plan Note (Signed)
Continue Symbicort and add TMP/SMX, mucinex and probiotics

## 2015-02-21 ENCOUNTER — Other Ambulatory Visit: Payer: Self-pay

## 2015-02-21 DIAGNOSIS — E785 Hyperlipidemia, unspecified: Secondary | ICD-10-CM

## 2015-02-21 MED ORDER — ROSUVASTATIN CALCIUM 40 MG PO TABS: 40.0000 mg | ORAL_TABLET | Freq: Every day | ORAL | Status: DC

## 2015-02-21 NOTE — Telephone Encounter (Signed)
Refill sent for crestor 40 mg

## 2015-02-23 ENCOUNTER — Other Ambulatory Visit: Payer: Self-pay | Admitting: *Deleted

## 2015-02-23 DIAGNOSIS — E785 Hyperlipidemia, unspecified: Secondary | ICD-10-CM

## 2015-02-23 MED ORDER — ROSUVASTATIN CALCIUM 40 MG PO TABS: 40.0000 mg | ORAL_TABLET | Freq: Every day | ORAL | Status: DC

## 2015-02-26 ENCOUNTER — Other Ambulatory Visit: Payer: Self-pay

## 2015-02-26 ENCOUNTER — Ambulatory Visit (INDEPENDENT_AMBULATORY_CARE_PROVIDER_SITE_OTHER): Payer: PPO

## 2015-02-26 DIAGNOSIS — R739 Hyperglycemia, unspecified: Secondary | ICD-10-CM

## 2015-02-26 DIAGNOSIS — E785 Hyperlipidemia, unspecified: Secondary | ICD-10-CM

## 2015-02-26 DIAGNOSIS — I10 Essential (primary) hypertension: Secondary | ICD-10-CM

## 2015-02-26 DIAGNOSIS — R0600 Dyspnea, unspecified: Secondary | ICD-10-CM | POA: Diagnosis not present

## 2015-02-26 DIAGNOSIS — I214 Non-ST elevation (NSTEMI) myocardial infarction: Secondary | ICD-10-CM | POA: Diagnosis not present

## 2015-02-26 DIAGNOSIS — M545 Low back pain: Secondary | ICD-10-CM | POA: Diagnosis not present

## 2015-02-26 DIAGNOSIS — J209 Acute bronchitis, unspecified: Secondary | ICD-10-CM

## 2015-03-01 ENCOUNTER — Ambulatory Visit (INDEPENDENT_AMBULATORY_CARE_PROVIDER_SITE_OTHER): Payer: PPO | Admitting: Cardiovascular Disease

## 2015-03-01 ENCOUNTER — Encounter: Payer: Self-pay | Admitting: Cardiovascular Disease

## 2015-03-01 VITALS — BP 124/82 | HR 65 | Ht 62.0 in | Wt 159.2 lb

## 2015-03-01 DIAGNOSIS — I25700 Atherosclerosis of coronary artery bypass graft(s), unspecified, with unstable angina pectoris: Secondary | ICD-10-CM

## 2015-03-01 DIAGNOSIS — R0602 Shortness of breath: Secondary | ICD-10-CM | POA: Diagnosis not present

## 2015-03-01 DIAGNOSIS — E785 Hyperlipidemia, unspecified: Secondary | ICD-10-CM

## 2015-03-01 DIAGNOSIS — Z955 Presence of coronary angioplasty implant and graft: Secondary | ICD-10-CM | POA: Diagnosis not present

## 2015-03-01 DIAGNOSIS — Z951 Presence of aortocoronary bypass graft: Secondary | ICD-10-CM | POA: Diagnosis not present

## 2015-03-01 DIAGNOSIS — I10 Essential (primary) hypertension: Secondary | ICD-10-CM

## 2015-03-01 DIAGNOSIS — I6523 Occlusion and stenosis of bilateral carotid arteries: Secondary | ICD-10-CM

## 2015-03-01 NOTE — Assessment & Plan Note (Signed)
40 to 59% carotid disease Continue aggressive cholesterol management Repeat on annual basis

## 2015-03-01 NOTE — Assessment & Plan Note (Signed)
Blood pressure is well controlled on today's visit. No changes made to the medications. 

## 2015-03-01 NOTE — Assessment & Plan Note (Signed)
Cholesterol is at goal on the current lipid regimen. No changes to the medications were made.  

## 2015-03-01 NOTE — Progress Notes (Signed)
[name], female    DOB: 11-17-1940, 74 y.o.   MRN: [id number]  HPI Comments: Andrea [name] is a pleasant 74 year old woman with history of chronic bradycardia,  spinal stenosis, peripheral vascular disease, status post CEA on the left ,  40-59% residual disease on the right,   hyperlipidemia, significantly improved on Crestor, coronary artery disease and bypass surgery in 1999, stent placement to distal RCA may 2013, who presents for routine followup  Of her coronary artery disease . She has underlying COPD, long smoking history for at least 30 years  In follow up today, she reports having worsening shortness of breath over the past several months. She was placed on prednisone for 2 months for ENT problem, had significant weight gain Denies any leg edema,  PND. Also reports that she has been getting over pneumonia from last month, treated by primary care She is concerned about her breathing as it typically presents with exertion  Old stress test reviewed with her from 2013 at which time she had normal perfusion  symptoms at that time felt secondary to COPD She reports that she has been compliant with her inhalers. Has not had follow-up with pulmonary since 2014 Previously seen by Dr. Melvyn Novas.  She denies any regular exercise program  EKG on today's visit shows normal sinus rhythm with rate 65 bpm, diffuse T-wave abnormality, PVCs. No change from prior EKG  Other past medical history  tolerating her Crestor 40 mg daily. On this her cholesterol has significantly improved now down to within a excellent range. Chronic back pain     Allergies  Allergen Reactions  . Ciprofloxacin     REACTION: rash IV  . Codeine     REACTION: nausea/vomiting  . Erythromycin     REACTION: tongue burns  . Meperidine Hcl     REACTION: Nausea/vomiting  . Penicillins     REACTION: rash  . Shellfish Allergy Nausea And Vomiting  . Tape Rash    Outpatient Encounter Prescriptions as of  03/01/2015  Medication Sig  . acetaminophen (TYLENOL) 650 MG CR tablet Take 1,300 mg by mouth 2 (two) times daily.  Marland Kitchen albuterol (PROVENTIL HFA;VENTOLIN HFA) 108 (90 BASE) MCG/ACT inhaler Inhale 2 puffs into the lungs every 6 (six) hours as needed for wheezing.  Marland Kitchen aspirin 81 MG tablet Take 81 mg by mouth daily.  . Calcium Carbonate-Vitamin D (CALCIUM + D PO) Take 1 tablet by mouth 2 (two) times daily.  . clopidogrel (PLAVIX) 75 MG tablet Take 1 tablet (75 mg total) by mouth daily.  . diclofenac sodium (VOLTAREN) 1 % GEL Apply 2 g topically 3 (three) times daily as needed (pain).  . fluticasone (FLONASE) 50 MCG/ACT nasal spray Place 2 sprays into the nose 2 (two) times daily as needed. For congestion  . hydrochlorothiazide (HYDRODIURIL) 25 MG tablet Take 1 tablet (25 mg total) by mouth every morning.  Marland Kitchen HYDROcodone-acetaminophen (NORCO/VICODIN) 5-325 MG per tablet Take 1 tablet by mouth 2 (two) times daily as needed for pain.  . isosorbide mononitrate (IMDUR) 30 MG 24 hr tablet Take 1 tablet (30 mg total) by mouth daily.  Javier Docker Oil 1000 MG CAPS Take 1,000 mg by mouth daily.  Marland Kitchen losartan (COZAAR) 100 MG tablet Take 1 tablet (100 mg total) by mouth daily.  . montelukast (SINGULAIR) 10 MG tablet TAKE 1 TABLET BY MOUTH AT BEDTIME  . mupirocin ointment (BACTROBAN) 2 % Place 1 application into the nose daily. Via qtip to ear daily  .  nitroGLYCERIN (NITROSTAT) 0.4 MG SL tablet Place 1 tablet (0.4 mg total) under the tongue every 5 (five) minutes as needed for chest pain.  . pantoprazole (PROTONIX) 40 MG tablet TAKE 1 TABLET (40 MG TOTAL) BY MOUTH DAILY.  Vladimir Faster Glycol-Propyl Glycol (SYSTANE OP) Place 1 drop into both eyes daily as needed (dry eyes).  . potassium chloride (K-DUR) 10 MEQ tablet Take 1 tablet (10 mEq total) by mouth daily as needed.  . rosuvastatin (CRESTOR) 40 MG tablet Take 1 tablet (40 mg total) by mouth daily.  . sertraline (ZOLOFT) 50 MG tablet TAKE 1 TABLET EVERY DAY **NEED  OFFICE VISIT  . Spacer/Aero-Holding Chambers DEVI 1 spacer to use prn with HFA device as directed  . sulfamethoxazole-trimethoprim (BACTRIM DS,SEPTRA DS) 800-160 MG per tablet Take 1 tablet by mouth 2 (two) times daily.  . SYMBICORT 160-4.5 MCG/ACT inhaler INHALE 2 PUFFS INTO THE LUNGS 2 (TWO) TIMES DAILY.  . [DISCONTINUED] benzonatate (TESSALON) 100 MG capsule Take 1 capsule (100 mg total) by mouth 3 (three) times daily as needed for cough. (Patient not taking: Reported on 03/01/2015)  . [DISCONTINUED] hydrochlorothiazide (HYDRODIURIL) 25 MG tablet TAKE 1 TABLET (25 MG TOTAL) BY MOUTH EVERY MORNING. (Patient not taking: Reported on 03/01/2015)   No facility-administered encounter medications on file as of 03/01/2015.    Past Medical History  Diagnosis Date  . COPD (chronic obstructive pulmonary disease) ([place])   . Cerebrovascular disease 01/2009    carotid u/s  R 0-39%   L 60-79%  . CAD (coronary artery disease)     s/CABG (reports IMA and 2 SVGs) back in 1999  Myoview normal 3/10; s/p PCI with DES to PL branch of distal RCA 09/2011  . Depression   . GERD (gastroesophageal reflux disease)   . Hypertension   . Hyperlipidemia   . Osteoporosis   . PVD (peripheral vascular disease) ([place])   . Dizziness   . AAA (abdominal aortic aneurysm) ([place]) 01/2009    AAA (2.8 x 3.0)  moderate RAS (left); 2.7 x 2.7 cm (07/11/05)  . NSTEMI (non-ST elevated myocardial infarction) ([place]) 11/12    Cath showed atretic IMA graft to the LAD, SVG to PD was patent but the continuation of this graft to the PL branch was occluded; there were L to R collaterals; Mid LAD had a 60 to 70% stenosis. She has been treated medically.  Neg Myoview 05/2011  . Bradycardia     Metoprolol stopped 08/2011  . Angina   . Dysrhythmia     hx of sinus brady  . Shortness of breath   . Blood transfusion     hx of transfusion   . Herniated lumbar disc without myelopathy   . Anosmia   . Acute bronchitis 03/20/2013  . Medicare annual  wellness visit, subsequent 07/31/2013  . Thoracic back pain 01/24/2014  . Baker's cyst of knee 01/30/2014    Past Surgical History  Procedure Laterality Date  . Coronary artery bypass graft  1999  . Tubal ligation    . Abdominal aortic aneurysm repair    . Femoral artery stent    . Closed reduction nasal fracture  11/2007  . Carotid endarterectomy  01/02/11    left  . Coronary angioplasty with stent placement  10/10/2011    drug eluting  to rc & saphenous  . Nasal sinus surgery      twice  . Left heart catheterization with coronary/graft angiogram N/A 04/22/2011    Procedure: LEFT HEART CATHETERIZATION WITH CORONARY/GRAFT  Cyril Loosen;  Surgeon: Jolaine Artist, MD;  Location[facility];  Service: Cardiovascular;  Laterality: N/A;  . Left heart catheterization with coronary/graft angiogram N/A 10/10/2011    Procedure: LEFT HEART CATHETERIZATION WITH Beatrix Fetters;  Surgeon: Peter M Martinique, MD;  Location: [facility] CATH LAB;  Service: Cardiovascular;  Laterality: N/A;    Social History  reports that she quit smoking about 22 years ago. Her smoking use included Cigarettes. She has a 20 pack-year smoking history. She has never used smokeless tobacco. She reports that she does not drink alcohol or use illicit drugs.  Family History family history includes Alcohol abuse in her brother; Aneurysm in her brother; Cancer in her father and sister; Cirrhosis in her maternal grandfather; Colon cancer (age of onset: 57) in her father; Diabetes in her maternal grandmother and mother; Heart attack in her brother and brother; Heart attack (age of onset: 58) in her mother; Heart disease in her brother, brother, and father; Hyperlipidemia in her daughter; Hypertension in her brother and father; Hypothyroidism in her sister; Lung cancer in her father and sister; Obesity in her daughter and son; Stroke in her maternal grandmother and paternal grandmother.        Review of Systems  Constitutional:  Negative.   Respiratory: Positive for shortness of breath.   Cardiovascular: Negative.   Gastrointestinal: Negative.   Musculoskeletal: Positive for back pain.  Skin: Negative.   Neurological: Negative.   Hematological: Negative.   Psychiatric/Behavioral: Negative.   All other systems reviewed and are negative.   BP 124/82 mmHg  Pulse 65  Ht '5\' 2"'$  (1.575 m)  Wt 159 lb 4 oz (72.235 kg)  BMI 29.12 kg/m2  Physical Exam  Constitutional: She is oriented to person, place, and time. She appears well-developed and well-nourished.  HENT:  Head: Normocephalic.  Nose: Nose normal.  Mouth/Throat: Oropharynx is clear and moist.  Eyes: Conjunctivae are normal. Pupils are equal, round, and reactive to light.  Neck: Normal range of motion. Neck supple. No JVD present.  Cardiovascular: Normal rate, regular rhythm, S1 normal, S2 normal and intact distal pulses.  Exam reveals no gallop and no friction rub.   Murmur heard.  Systolic murmur is present with a grade of 2/6  Pulmonary/Chest: Effort normal. No respiratory distress. She has decreased breath sounds. She has no wheezes. She has no rales. She exhibits no tenderness.  Abdominal: Soft. Bowel sounds are normal. She exhibits no distension. There is no tenderness.  Musculoskeletal: Normal range of motion. She exhibits no edema or tenderness.  Lymphadenopathy:    She has no cervical adenopathy.  Neurological: She is alert and oriented to person, place, and time. Coordination normal.  Skin: Skin is warm and dry. No rash noted. No erythema.  Psychiatric: She has a normal mood and affect. Her behavior is normal. Judgment and thought content normal.    Assessment and Plan  Nursing note and vitals reviewed.

## 2015-03-01 NOTE — Assessment & Plan Note (Signed)
She reports worsening shortness of breath as detailed. Unable to exclude ischemia Unable to treadmill secondary to underlying lung disease. Pharmacologic Myoview has been ordered

## 2015-03-01 NOTE — Patient Instructions (Addendum)
You are doing well. No medication changes were made.  We will schedule a pharmacological myoview for shortness of breath, CABG, CAD prior stent Unable to treadmill  Please call us if you have new issues that need to be addressed before your next appt.  Your physician wants you to follow-up in: 6 months.  You will receive a reminder letter in the mail two months in advance. If you don't receive a letter, please call our office to schedule the follow-up appointment.  [place]  Your caregiver has ordered a Stress Test with nuclear imaging. The purpose of this test is to evaluate the blood supply to your heart muscle. This procedure is referred to as a "Non-Invasive Stress Test." This is because other than having an IV started in your vein, nothing is inserted or "invades" your body. Cardiac stress tests are done to find areas of poor blood flow to the heart by determining the extent of coronary artery disease (CAD). Some patients exercise on a treadmill, which naturally increases the blood flow to your heart, while others who are  unable to walk on a treadmill due to physical limitations have a pharmacologic/chemical stress agent called Lexiscan . This medicine will mimic walking on a treadmill by temporarily increasing your coronary blood flow.   Please note: these test may take anywhere between 2-4 hours to complete  PLEASE REPORT TO [place] AT THE FIRST DESK WILL DIRECT YOU WHERE TO GO  Date of Procedure:____Tuesday, Oct 11_____________  Arrival Time for Procedure:_____7:15 am____________  Instructions regarding medication:   __X__:  Hold ISOSORBIDE the night before and morning of procedure   PLEASE NOTIFY THE OFFICE AT LEAST 24 HOURS IN ADVANCE IF YOU ARE UNABLE TO KEEP YOUR APPOINTMENT.  [phone] AND  PLEASE NOTIFY NUCLEAR MEDICINE AT [facility] AT LEAST 24 HOURS IN ADVANCE IF YOU ARE UNABLE TO KEEP YOUR APPOINTMENT. [phone]  How to prepare  for your Myoview test:   Do not eat or drink after midnight  No caffeine for 24 hours prior to test  No smoking 24 hours prior to test.  Your medication may be taken with water.  If your doctor stopped a medication because of this test, do not take that medication.  Ladies, please do not wear dresses.  Skirts or pants are appropriate. Please wear a short sleeve shirt.  No perfume, cologne or lotion.     Cardiac Nuclear Scanning A cardiac nuclear scan is used to check your heart for problems, such as the following:  A portion of the heart is not getting enough blood.  Part of the heart muscle has died, which happens with a heart attack.  The heart wall is not working normally.  In this test, a radioactive dye (tracer) is injected into your bloodstream. After the tracer has traveled to your heart, a scanning device is used to measure how much of the tracer is absorbed by or distributed to various areas of your heart. LET [facility] CARE PROVIDER KNOW ABOUT:  Any allergies you have.  All medicines you are taking, including vitamins, herbs, eye drops, creams, and over-the-counter medicines.  Previous problems you or members of your family have had with the use of anesthetics.  Any blood disorders you have.  Previous surgeries you have had.  Medical conditions you have.  RISKS AND COMPLICATIONS Generally, this is a safe procedure. However, as with any procedure, problems can occur. Possible problems include:   Serious chest pain.  Rapid  heartbeat.  Sensation of warmth in your chest. This usually passes quickly. BEFORE THE PROCEDURE Ask your health care provider about changing or stopping your regular medicines. PROCEDURE This procedure is usually done at a hospital and takes 2-4 hours.  An IV tube is inserted into one of your veins.  Your health care provider will inject a small amount of radioactive tracer through the tube.  You will then wait for 20-40 minutes  while the tracer travels through your bloodstream.  You will lie down on an exam table so images of your heart can be taken. Images will be taken for about 15-20 minutes.  You will exercise on a treadmill or stationary bike. While you exercise, your heart activity will be monitored with an electrocardiogram (ECG), and your blood pressure will be checked.  If you are unable to exercise, you may be given a medicine to make your heart beat faster.  When blood flow to your heart has peaked, tracer will again be injected through the IV tube.  After 20-40 minutes, you will get back on the exam table and have more images taken of your heart.  When the procedure is over, your IV tube will be removed. AFTER THE PROCEDURE  You will likely be able to leave shortly after the test. Unless your health care provider tells you otherwise, you may return to your normal schedule, including diet, activities, and medicines.  Make sure you find out how and when you will get your test results.   This information is not intended to replace advice given to you by your health care provider. Make sure you discuss any questions you have with your health care provider.   Document Released: 06/06/2004 Document Revised: 05/17/2013 Document Reviewed: 04/20/2013 Elsevier Interactive Patient Education [employer].

## 2015-03-01 NOTE — Assessment & Plan Note (Addendum)
Etiology of her shortness of breath is unclear Underlying COPD Previously seen by pulmonary. Taking her inhalers Stress test ordered as above to rule out ischemia

## 2015-03-06 ENCOUNTER — Ambulatory Visit
Admission: RE | Admit: 2015-03-06 | Discharge: 2015-03-06 | Disposition: A | Discharge status: Home / Self Care | Payer: PPO | Source: Ambulatory Visit | Attending: Cardiovascular Disease | Admitting: Cardiovascular Disease

## 2015-03-06 DIAGNOSIS — R0602 Shortness of breath: Secondary | ICD-10-CM | POA: Diagnosis not present

## 2015-03-06 DIAGNOSIS — I25700 Atherosclerosis of coronary artery bypass graft(s), unspecified, with unstable angina pectoris: Secondary | ICD-10-CM | POA: Diagnosis not present

## 2015-03-06 DIAGNOSIS — Z951 Presence of aortocoronary bypass graft: Secondary | ICD-10-CM | POA: Diagnosis not present

## 2015-03-06 DIAGNOSIS — Z955 Presence of coronary angioplasty implant and graft: Secondary | ICD-10-CM

## 2015-03-06 LAB — NM MYOCAR MULTI W/SPECT W/WALL MOTION / EF
CHL CUP MPHR: 145 {beats}/min
CHL CUP STRESS STAGE 1 GRADE: 0 %
CHL CUP STRESS STAGE 2 HR: 58 {beats}/min
CHL CUP STRESS STAGE 3 GRADE: 0 %
CHL CUP STRESS STAGE 3 HR: 58 {beats}/min
CHL CUP STRESS STAGE 4 GRADE: 0 %
CHL CUP STRESS STAGE 4 HR: 100 {beats}/min
CHL CUP STRESS STAGE 6 GRADE: 0 %
CHL CUP STRESS STAGE 6 HR: 82 {beats}/min
CHL CUP STRESS STAGE 6 SPEED: 0 mph
CSEPEW: 1 METS
CSEPHR: 72 %
Exercise duration (min): 0 min
Exercise duration (sec): 0 s
LV dias vol: 65 mL
LV sys vol: 22 mL
Peak HR: 100 {beats}/min
Percent of predicted max HR: 68 %
Rest HR: 57 {beats}/min
SDS: 2
SRS: 0
SSS: 2
Stage 1 HR: 57 {beats}/min
Stage 1 Speed: 0 mph
Stage 2 Grade: 0 %
Stage 2 Speed: 0 mph
Stage 3 Speed: 0 mph
Stage 4 Speed: 0 mph
Stage 5 DBP: 76 mmHg
Stage 5 Grade: 0 %
Stage 5 HR: 95 {beats}/min
Stage 5 SBP: 160 mmHg
Stage 5 Speed: 0 mph
Stage 6 DBP: 76 mmHg
Stage 6 SBP: 160 mmHg
TID: 1.24

## 2015-03-06 MED ORDER — TECHNETIUM TC 99M SESTAMIBI - CARDIOLITE
32.5860 | Freq: Once | INTRAVENOUS | Status: AC | PRN
  Administered 2015-03-06: 32.586 via INTRAVENOUS

## 2015-03-06 MED ORDER — TECHNETIUM TC 99M SESTAMIBI - CARDIOLITE
13.0000 | Freq: Once | INTRAVENOUS | Status: AC | PRN
  Administered 2015-03-06: 08:00:00 11.55 via INTRAVENOUS

## 2015-03-06 MED ORDER — REGADENOSON 0.4 MG/5ML IV SOLN
0.4000 mg | Freq: Once | INTRAVENOUS | Status: AC
  Administered 2015-03-06: 0.4 mg via INTRAVENOUS

## 2015-03-07 ENCOUNTER — Other Ambulatory Visit: Payer: Self-pay | Admitting: Family Medicine

## 2015-03-07 ENCOUNTER — Telehealth: Payer: Self-pay | Admitting: Family Medicine

## 2015-03-07 DIAGNOSIS — R0602 Shortness of breath: Secondary | ICD-10-CM

## 2015-03-07 NOTE — Telephone Encounter (Signed)
Relation to PP:GFQM Call back [phone]   Reason for call:  Patient requesting a referral to [place]: Tanda Rockers MD

## 2015-03-12 ENCOUNTER — Other Ambulatory Visit: Payer: Self-pay | Admitting: Cardiovascular Disease

## 2015-03-19 ENCOUNTER — Encounter: Payer: Self-pay | Admitting: Internal Medicine

## 2015-03-19 ENCOUNTER — Ambulatory Visit (INDEPENDENT_AMBULATORY_CARE_PROVIDER_SITE_OTHER)
Admission: RE | Admit: 2015-03-19 | Discharge: 2015-03-19 | Disposition: A | Discharge status: Home / Self Care | Payer: PPO | Source: Ambulatory Visit | Attending: Internal Medicine | Admitting: Internal Medicine

## 2015-03-19 ENCOUNTER — Ambulatory Visit (INDEPENDENT_AMBULATORY_CARE_PROVIDER_SITE_OTHER): Payer: PPO | Admitting: Internal Medicine

## 2015-03-19 ENCOUNTER — Other Ambulatory Visit: Payer: Self-pay | Admitting: Family Medicine

## 2015-03-19 VITALS — BP 140/78 | HR 55 | Ht 63.0 in | Wt 160.0 lb

## 2015-03-19 DIAGNOSIS — R05 Cough: Secondary | ICD-10-CM | POA: Diagnosis not present

## 2015-03-19 DIAGNOSIS — R058 Other specified cough: Secondary | ICD-10-CM

## 2015-03-19 DIAGNOSIS — J189 Pneumonia, unspecified organism: Secondary | ICD-10-CM | POA: Diagnosis not present

## 2015-03-19 DIAGNOSIS — J449 Chronic obstructive pulmonary disease, unspecified: Secondary | ICD-10-CM | POA: Diagnosis not present

## 2015-03-19 NOTE — Patient Instructions (Signed)
Stay on protonix 40 mg Take 30-60 min before first meal of the day   GERD (REFLUX)  is an extremely common cause of respiratory symptoms just like yours , many times with no obvious heartburn at all.    It can be treated with medication, but also with lifestyle changes including elevation of the head of your bed (ideally with 6 inch  bed blocks),  Smoking cessation, avoidance of late meals, excessive alcohol, and avoid fatty foods, chocolate, peppermint, colas, red wine, and acidic juices such as orange juice.  NO MINT OR MENTHOL PRODUCTS SO NO COUGH DROPS  USE SUGARLESS CANDY INSTEAD (Jolley ranchers or Stover's or Life Savers) or even ice chips will also do - the key is to swallow to prevent all throat clearing. NO OIL BASED VITAMINS - use powdered substitutes.  Please remember to go to the  x-ray department downstairs for your tests - we will call you with the results when they are available.

## 2015-03-19 NOTE — Progress Notes (Signed)
Subjective:    [name], female    DOB: 07-01-1940  MRN: [id number]    Brief patient profile:  72 yowf quit smoking 1996 at onset heart dz complicated by blood clots/ pleural fluid sp cabg in 1999 and full recovery of function but told she had copd around 2009 and referred 03/23/2013 by Randel Pigg to pulmonary clinic for eval of sob and cough   History of Present Illness  03/23/2013 1st [place] [facility] office visit/ Dayrin Stallone cc new onset sob/ cough assoc with onset of sinus problems around 2009 and worse since Aug 2014 eval by ENT for allergies  = Pos Ragweed with daily sense of wheezing / sob notes wheezing on exp immediately on awakening rx symbicort 80-90% better p am symbicort and then needs alb around 4pm and less need for albuterol whenever on prednisone  rec Plan A = Automatic = symbicort 160 Take 2 puffs first thing in am and then another 2 puffs about 12 hours later.  Work on inhaler technique:     Plan B = backup Only use your albuterol as a rescue medication   Protonix 40 mg Take 30-60 min before first meal of the day automatically  GERD diet   05/24/2013 f/u ov/Teana Lindahl re: GOLD II copd Chief Complaint  Patient presents with  . COPD    PFT done today.  Breathing with some improvement   Not limited from desired activities, walks fine moderate pace, extended, can't get in a real hurry or do inclines Rec Ok to try off protonix to see if it makes any difference in your resp symptoms and if so resume it daily Continue symbicort 160 Take 2 puffs first thing in am and then another 2 puffs about 12 hours later.  Only use your albuterol as a rescue medication to be used if you can't catch    03/19/2015   Acute  ov/Ferd Horrigan re: GOLD II/ symbicort/ singulair and rare alb until one month prior to [place] Complaint  Patient presents with  . Acute Visit    Pt c/o increased SOB since Sept 2016- she gets out of breath just bending over, walking up steps and walking to the  mailbox and back. She has minimal, non prod cough and wheezing that comes and goes. She is using albuterol inhaler 2-3 x per day.       Started in September > dx as pna by Dr Charlett Blake and completed rx with bactrim  Cough is worse during the day and dry now and using lots more albuterol   No obvious day to day or daytime variabilty or assoc   cp or chest tightness, subjective wheeze overt sinus or hb symptoms. No unusual exp hx or h/o childhood pna/ asthma or knowledge of premature birth.  Sleeping ok without nocturnal  or early am exacerbation  of respiratory  c/o's or need for noct saba. Also denies any obvious fluctuation of symptoms with weather or environmental changes or other aggravating or alleviating factors except as outlined above   Current Medications, Allergies, Complete Past Medical History, Past Surgical History, Family History, and Social History were reviewed in [employer] record.  ROS  The following are not active complaints unless bolded sore throat, dysphagia, dental problems, itching, sneezing,  nasal congestion or excess/ purulent secretions, ear ache,   fever, chills, sweats, unintended wt loss, pleuritic or exertional cp, hemoptysis,  orthopnea pnd or leg swelling, presyncope, palpitations, heartburn, abdominal pain, anorexia, nausea, vomiting, diarrhea  or change in bowel or urinary habits, change in stools or urine, dysuria,hematuria,  rash, arthralgias, visual complaints, headache, numbness weakness or ataxia or problems with walking or coordination,  change in mood/affect or memory.            Objective:   Physical Exam  amb pleasant wf nad  05/24/2013 149  > 03/19/2015   160  Wt Readings from Last 3 Encounters:  03/23/13 155 lb (70.308 kg)  03/17/13 159 lb 1.3 oz (72.158 kg)  11/23/12 154 lb 12 oz (70.194 kg)    HEENT mild turbinate edema.  Oropharynx no thrush or excess pnd or cobblestoning.  No JVD or cervical adenopathy. Mild accessory  muscle hypertrophy. Trachea midline, nl thryroid. Chest was hyperinflated by percussion with diminished breath sounds and moderate increased exp time without wheeze. Hoover sign positive at mid inspiration. Regular rate and rhythm without murmur gallop or rub or increase P2 or edema.  Abd: no hsm, nl excursion. Ext warm without cyanosis or clubbing.       CXR PA and Lateral:   03/19/2015 :    I personally reviewed images and agree with radiology impression as follows:   1. No acute cardiopulmonary disease. 2. Small area of right lower lung opacity seen previously is no longer evident consistent with resolved pneumonia. 3. Lung hyperexpansion and prominent bronchovascular markings suggests COPD.        Assessment & Plan:

## 2015-03-23 DIAGNOSIS — R058 Other specified cough: Secondary | ICD-10-CM | POA: Insufficient documentation

## 2015-03-23 DIAGNOSIS — J189 Pneumonia, unspecified organism: Secondary | ICD-10-CM | POA: Insufficient documentation

## 2015-03-23 DIAGNOSIS — R05 Cough: Secondary | ICD-10-CM | POA: Insufficient documentation

## 2015-03-23 NOTE — Assessment & Plan Note (Signed)
Explained the natural history of uri and why it's necessary in patients at risk to treat GERD aggressively - at least  short term -   to reduce risk of evolving cyclical cough initially  triggered by epithelial injury and a heightened sensitivty to the effects of any upper airway irritants,  most importantly acid - related - then perpetuated by epithelial injury related to the cough itself as the upper airway collapses on itself.  That is, the more sensitive the epithelium becomes once it is damaged by the virus, the more the ensuing irritability> the more the cough, the more the secondary reflux (especially in those prone to reflux) the more the irritation of the sensitive mucosa and so on in a  Classic cyclical pattern.    See instructions for specific recommendations which were reviewed directly with the patient who was given a copy with highlighter outlining the key components.

## 2015-03-23 NOTE — Assessment & Plan Note (Signed)
-   PFT's 05/24/2013  FEV1 1.32 (67%) ratio 54 and no change p B2 but used symbicort am of test, DLCO 54 corrects to 64   Persistent symptoms of sob / cough but very little on exam to suggest this is due to copd and more suggestive of uacs (see sep a/p)  The proper method of use, as well as anticipated side effects, of a metered-dose inhaler are discussed and demonstrated to the patient. Improved effectiveness after extensive coaching during this visit to a level of approximately  90% from a baseline of 50%  I had an extended discussion with the patient reviewing all relevant studies completed to date and  lasting 15 to 20 minutes of a 25 minute visit    Each maintenance medication was reviewed in detail including most importantly the difference between maintenance and prns and under what circumstances the prns are to be triggered using an action plan format that is not reflected in the computer generated alphabetically organized AVS.    Please see instructions for details which were reviewed in writing and the patient given a copy highlighting the part that I personally wrote and discussed at today's ov.

## 2015-03-27 ENCOUNTER — Encounter: Payer: Self-pay | Admitting: Family Medicine

## 2015-03-27 ENCOUNTER — Ambulatory Visit (INDEPENDENT_AMBULATORY_CARE_PROVIDER_SITE_OTHER): Payer: PPO | Admitting: Family Medicine

## 2015-03-27 VITALS — BP 132/84 | HR 61 | Temp 98.0°F | Ht 62.0 in | Wt 158.2 lb

## 2015-03-27 DIAGNOSIS — J189 Pneumonia, unspecified organism: Secondary | ICD-10-CM

## 2015-03-27 DIAGNOSIS — R350 Frequency of micturition: Secondary | ICD-10-CM

## 2015-03-27 DIAGNOSIS — K219 Gastro-esophageal reflux disease without esophagitis: Secondary | ICD-10-CM

## 2015-03-27 DIAGNOSIS — L309 Dermatitis, unspecified: Secondary | ICD-10-CM | POA: Diagnosis not present

## 2015-03-27 DIAGNOSIS — N3090 Cystitis, unspecified without hematuria: Secondary | ICD-10-CM

## 2015-03-27 DIAGNOSIS — I10 Essential (primary) hypertension: Secondary | ICD-10-CM | POA: Diagnosis not present

## 2015-03-27 LAB — POCT URINALYSIS DIPSTICK
BILIRUBIN UA: NEGATIVE
GLUCOSE UA: NEGATIVE
Ketones, UA: NEGATIVE
Leukocytes, UA: NEGATIVE
NITRITE UA: NEGATIVE
Protein, UA: NEGATIVE
RBC UA: NEGATIVE
SPEC GRAV UA: 1.02
UROBILINOGEN UA: 2
pH, UA: 6

## 2015-03-27 MED ORDER — TRIAMCINOLONE ACETONIDE 0.5 % EX OINT: 1.0000 "application " | TOPICAL_OINTMENT | Freq: Two times a day (BID) | CUTANEOUS | Status: DC

## 2015-03-27 NOTE — Patient Instructions (Addendum)
Probiotic local [employer] or [place] locally. Consider [place] 10 strain probiotic daily at [email or web address]   Cholesterol Cholesterol is a white, waxy, fat-like substance needed by your body in small amounts. The liver makes all the cholesterol you need. Cholesterol is carried from the liver by the blood through the blood vessels. Deposits of cholesterol (plaque) may build up on blood vessel walls. These make the arteries narrower and stiffer. Cholesterol plaques increase the risk for heart attack and stroke.  You cannot feel your cholesterol level even if it is very high. The only way to know it is high is with a blood test. Once you know your cholesterol levels, you should keep a record of the test results. Work with your health care provider to keep your levels in the desired range.  WHAT DO THE RESULTS MEAN?  Total cholesterol is a rough measure of all the cholesterol in your blood.   LDL is the so-called bad cholesterol. This is the type that deposits cholesterol in the walls of the arteries. You want this level to be low.   HDL is the good cholesterol because it cleans the arteries and carries the LDL away. You want this level to be high.  Triglycerides are fat that the body can either burn for energy or store. High levels are closely linked to heart disease.  WHAT ARE THE DESIRED LEVELS OF CHOLESTEROL?  Total cholesterol below 200.   LDL below 100 for people at risk, below 70 for those at very high risk.   HDL above 50 is good, above 60 is best.   Triglycerides below 150.  HOW CAN I LOWER MY CHOLESTEROL?  Diet. Follow your diet programs as directed by your health care provider.   Choose fish or white meat chicken and [place], roasted or baked. Limit fatty cuts of red meat, fried foods, and processed meats, such as sausage and lunch meats.   Eat lots of fresh fruits and vegetables.  Choose whole grains, beans, pasta, potatoes, and cereals.    Use only small amounts of olive, corn, or canola oils.   Avoid butter, mayonnaise, shortening, or palm kernel oils.  Avoid foods with trans fats.   Drink skim or nonfat milk and eat low-fat or nonfat yogurt and cheeses. Avoid whole milk, cream, ice cream, egg yolks, and full-fat cheeses.   Healthy desserts include angel food cake, ginger snaps, animal crackers, hard candy, popsicles, and low-fat or nonfat frozen yogurt. Avoid pastries, cakes, pies, and cookies.   Exercise. Follow your exercise programs as directed by your health care provider.   A regular program helps decrease LDL and raise HDL.   A regular program helps with weight control.   Do things that increase your activity level like gardening, walking, or taking the stairs. Ask your health care provider about how you can be more active in your daily life.   Medicine. Take medicine only as directed by your health care provider.   Medicine may be prescribed by your health care provider to help lower cholesterol and decrease the risk for heart disease.   If you have several risk factors, you may need medicine even if your levels are normal.   This information is not intended to replace advice given to you by your health care provider. Make sure you discuss any questions you have with your health care provider.   Document Released: 02/04/2001 Document Revised: 06/02/2014 Document Reviewed: 02/23/2013 Elsevier Interactive Patient Education [employer].

## 2015-03-27 NOTE — Progress Notes (Signed)
Pre visit review using our clinic review tool, if applicable. No additional management support is needed unless otherwise documented below in the visit note. 

## 2015-03-29 LAB — CULTURE, URINE COMPREHENSIVE
COLONY COUNT: NO GROWTH
Organism ID, Bacteria: NO GROWTH

## 2015-04-08 NOTE — Assessment & Plan Note (Signed)
Avoid offending foods, start probiotics. Do not eat large meals in late evening and consider raising head of bed.  

## 2015-04-08 NOTE — Assessment & Plan Note (Signed)
struggling with urinary urgency and frequency but no dysuria. Urine culture is negative, is referred to urology for further consideration

## 2015-04-08 NOTE — Progress Notes (Signed)
Subjective:    [name], female    DOB: [birth year], 74 y.o.   MRN: [id number]  Chief Complaint  Patient presents with  . Follow-up    pneumonia  . Urinary Frequency    HPI Patient is in today for evaluation of persistent urinary frequency and urgency. She denies any dysuria, fevers, chills or hematuria. She does note some mild low back pain but this is stable. No GI complaints or change in bowel habits. Her pneumonia symptoms have greatly improved. She denies any shortness of breath, cough, congestion or acute concerns. Denies CP/palp/SOB/HA/congestion/fevers/GI c/o. Taking meds as prescribed  Past Medical History  Diagnosis Date  . COPD (chronic obstructive pulmonary disease) ([place])   . Cerebrovascular disease 01/2009    carotid u/s  R 0-39%   L 60-79%  . CAD (coronary artery disease)     s/CABG (reports IMA and 2 SVGs) back in 1999  Myoview normal 3/10; s/p PCI with DES to PL branch of distal RCA 09/2011  . Depression   . GERD (gastroesophageal reflux disease)   . Hypertension   . Hyperlipidemia   . Osteoporosis   . PVD (peripheral vascular disease) ([place])   . Dizziness   . AAA (abdominal aortic aneurysm) ([place]) 01/2009    AAA (2.8 x 3.0)  moderate RAS (left); 2.7 x 2.7 cm (07/11/05)  . NSTEMI (non-ST elevated myocardial infarction) ([place]) 11/12    Cath showed atretic IMA graft to the LAD, SVG to PD was patent but the continuation of this graft to the PL branch was occluded; there were L to R collaterals; Mid LAD had a 60 to 70% stenosis. She has been treated medically.  Neg Myoview 05/2011  . Bradycardia     Metoprolol stopped 08/2011  . Angina   . Dysrhythmia     hx of sinus brady  . Shortness of breath   . Blood transfusion     hx of transfusion   . Herniated lumbar disc without myelopathy   . Anosmia   . Acute bronchitis 03/20/2013  . Medicare annual wellness visit, subsequent 07/31/2013  . Thoracic back pain 01/24/2014  . Baker's cyst of knee 01/30/2014     Past Surgical History  Procedure Laterality Date  . Coronary artery bypass graft  1999  . Tubal ligation    . Abdominal aortic aneurysm repair    . Femoral artery stent    . Closed reduction nasal fracture  11/2007  . Carotid endarterectomy  01/02/11    left  . Coronary angioplasty with stent placement  10/10/2011    drug eluting  to rc & saphenous  . Nasal sinus surgery      twice  . Left heart catheterization with coronary/graft angiogram N/A 04/22/2011    Procedure: LEFT HEART CATHETERIZATION WITH Beatrix Fetters;  Surgeon: Jolaine Artist, MD;  Location: [facility] CATH LAB;  Service: Cardiovascular;  Laterality: N/A;  . Left heart catheterization with coronary/graft angiogram N/A 10/10/2011    Procedure: LEFT HEART CATHETERIZATION WITH Beatrix Fetters;  Surgeon: Peter M Martinique, MD;  Location: [facility] CATH LAB;  Service: Cardiovascular;  Laterality: N/A;    Family History  Problem Relation Age of Onset  . Heart attack Mother 10  . Diabetes Mother   . Colon cancer Father 8  . Lung cancer Father     smoked  . Heart disease Father     angina  . Hypertension Father   . Cancer Father     colon, lung  .  Lung cancer Sister     smoked  . Cancer Sister   . Hypothyroidism Sister   . Hypertension Brother   . Heart attack Brother   . Heart disease Brother     stent  . Heart attack Brother     CABG  . Heart disease Brother     CABG with 1 bypass  . Aneurysm Brother     brain  . Alcohol abuse Brother   . Hyperlipidemia Daughter   . Diabetes Maternal Grandmother   . Stroke Maternal Grandmother   . Cirrhosis Maternal Grandfather   . Stroke Paternal Grandmother   . Obesity Daughter   . Obesity Son     Social History   Social History  . Marital Status: Married    Spouse Name: N/A  . Number of Children: 5  . Years of Education: N/A   Occupational History  . Retired     former  Marine scientist   Social History Main Topics  . Smoking status: Former Smoker -- 0.50  packs/day for 40 years    Types: Cigarettes    Quit date: 01/19/1993  . Smokeless tobacco: Never Used  . Alcohol Use: No     Comment: rare  . Drug Use: No  . Sexual Activity: No   Other Topics Concern  . Not on file   Social History Narrative   Married with 5 children    Outpatient Prescriptions Prior to Visit  Medication Sig Dispense Refill  . acetaminophen (TYLENOL) 650 MG CR tablet Take 1,300 mg by mouth 2 (two) times daily.    Marland Kitchen albuterol (PROVENTIL HFA;VENTOLIN HFA) 108 (90 BASE) MCG/ACT inhaler Inhale 2 puffs into the lungs every 6 (six) hours as needed for wheezing. 1 Inhaler 2  . aspirin 81 MG tablet Take 81 mg by mouth daily.    . Calcium Carbonate-Vitamin D (CALCIUM + D PO) Take 1 tablet by mouth 2 (two) times daily.    . clopidogrel (PLAVIX) 75 MG tablet TAKE 1 TABLET (75 MG TOTAL) BY MOUTH DAILY. 90 tablet 3  . diclofenac sodium (VOLTAREN) 1 % GEL Apply 2 g topically 3 (three) times daily as needed (pain).    . fluticasone (FLONASE) 50 MCG/ACT nasal spray Place 2 sprays into the nose 2 (two) times daily as needed. For congestion    . hydrochlorothiazide (HYDRODIURIL) 25 MG tablet Take 1 tablet (25 mg total) by mouth every morning. 30 tablet 6  . HYDROcodone-acetaminophen (NORCO/VICODIN) 5-325 MG per tablet Take 1 tablet by mouth 2 (two) times daily as needed for pain. 30 tablet 1  . isosorbide mononitrate (IMDUR) 30 MG 24 hr tablet Take 1 tablet (30 mg total) by mouth daily. 90 tablet 3  . Krill Oil 1000 MG CAPS Take 1,000 mg by mouth daily.    Marland Kitchen losartan (COZAAR) 100 MG tablet TAKE 1 TABLET (100 MG TOTAL) BY MOUTH DAILY. 90 tablet 3  . montelukast (SINGULAIR) 10 MG tablet TAKE 1 TABLET BY MOUTH AT BEDTIME 30 tablet 6  . mupirocin ointment (BACTROBAN) 2 % Place 1 application into the nose daily. Via qtip to ear daily 22 g 0  . nitroGLYCERIN (NITROSTAT) 0.4 MG SL tablet Place 1 tablet (0.4 mg total) under the tongue every 5 (five) minutes as needed for chest pain. 25 tablet  6  . pantoprazole (PROTONIX) 40 MG tablet TAKE 1 TABLET (40 MG TOTAL) BY MOUTH DAILY. 90 tablet 3  . Polyethyl Glycol-Propyl Glycol (SYSTANE OP) Place 1 drop into both eyes daily  as needed (dry eyes).    . potassium chloride (K-DUR) 10 MEQ tablet Take 1 tablet (10 mEq total) by mouth daily as needed. 90 tablet 3  . rosuvastatin (CRESTOR) 40 MG tablet Take 1 tablet (40 mg total) by mouth daily. 90 tablet 3  . sertraline (ZOLOFT) 50 MG tablet TAKE 1 TABLET EVERY DAY **NEED OFFICE VISIT 90 tablet 1  . Spacer/Aero-Holding Chambers DEVI 1 spacer to use prn with HFA device as directed 1 each 0  . SYMBICORT 160-4.5 MCG/ACT inhaler INHALE 2 PUFFS INTO THE LUNGS 2 (TWO) TIMES DAILY. 10.2 Inhaler 2   No facility-administered medications prior to visit.    Allergies  Allergen Reactions  . Ciprofloxacin     REACTION: rash IV  . Codeine     REACTION: nausea/vomiting  . Erythromycin     REACTION: tongue burns  . Meperidine Hcl     REACTION: Nausea/vomiting  . Penicillins     REACTION: rash  . Shellfish Allergy Nausea And Vomiting  . Tape Rash    Review of Systems  Constitutional: Negative for fever and malaise/fatigue.  HENT: Negative for congestion.   Eyes: Negative for discharge.  Respiratory: Negative for shortness of breath.   Cardiovascular: Negative for chest pain, palpitations and leg swelling.  Gastrointestinal: Negative for nausea and abdominal pain.  Genitourinary: Positive for urgency and frequency. Negative for dysuria and hematuria.  Musculoskeletal: Negative for falls.  Skin: Negative for rash.  Neurological: Negative for loss of consciousness and headaches.  Endo/Heme/Allergies: Negative for environmental allergies.  Psychiatric/Behavioral: Negative for depression. The patient is not nervous/anxious.        Objective:    Physical Exam  Constitutional: She is oriented to person, place, and time. She appears well-developed and well-nourished. No distress.  HENT:  Head:  Normocephalic and atraumatic.  Nose: Nose normal.  Eyes: Right eye exhibits no discharge. Left eye exhibits no discharge.  Neck: Normal range of motion. Neck supple.  Cardiovascular: Normal rate and regular rhythm.   No murmur heard. Pulmonary/Chest: Effort normal and breath sounds normal.  Abdominal: Soft. Bowel sounds are normal. There is no tenderness.  Musculoskeletal: She exhibits no edema.  Neurological: She is alert and oriented to person, place, and time.  Skin: Skin is warm and dry.  Psychiatric: She has a normal mood and affect.  Nursing note and vitals reviewed.   BP 132/84 mmHg  Pulse 61  Temp(Src) 98 F (36.7 C) (Oral)  Ht '5\' 2"'$  (1.575 m)  Wt 158 lb 4 oz (71.782 kg)  BMI 28.94 kg/m2  SpO2 96% Wt Readings from Last 3 Encounters:  03/27/15 158 lb 4 oz (71.782 kg)  03/19/15 160 lb (72.576 kg)  03/01/15 159 lb 4 oz (72.235 kg)     Lab Results  Component Value Date   WBC 9.4 02/08/2015   HGB 14.6 02/08/2015   HCT 43.0 02/08/2015   PLT 210.0 02/08/2015   GLUCOSE 81 02/08/2015   CHOL 144 02/08/2015   TRIG 330.0* 02/08/2015   HDL 30.90* 02/08/2015   LDLDIRECT 83.0 02/08/2015   LDLCALC 75 01/24/2014   ALT 13 02/08/2015   AST 18 02/08/2015   NA 141 02/08/2015   K 3.9 02/08/2015   CL 99 02/08/2015   CREATININE 1.04 02/08/2015   BUN 16 02/08/2015   CO2 34* 02/08/2015   TSH 2.91 02/08/2015   INR 1.0 10/08/2011   HGBA1C 6.1 02/08/2015    Lab Results  Component Value Date   TSH 2.91 02/08/2015  Lab Results  Component Value Date   WBC 9.4 02/08/2015   HGB 14.6 02/08/2015   HCT 43.0 02/08/2015   MCV 86.1 02/08/2015   PLT 210.0 02/08/2015   Lab Results  Component Value Date   NA 141 02/08/2015   K 3.9 02/08/2015   CO2 34* 02/08/2015   GLUCOSE 81 02/08/2015   BUN 16 02/08/2015   CREATININE 1.04 02/08/2015   BILITOT 0.7 02/08/2015   ALKPHOS 52 02/08/2015   AST 18 02/08/2015   ALT 13 02/08/2015   PROT 7.1 02/08/2015   ALBUMIN 4.2 02/08/2015    CALCIUM 9.8 02/08/2015   ANIONGAP 10 07/15/2014   GFR 54.77* 02/08/2015   Lab Results  Component Value Date   CHOL 144 02/08/2015   Lab Results  Component Value Date   HDL 30.90* 02/08/2015   Lab Results  Component Value Date   LDLCALC 75 01/24/2014   Lab Results  Component Value Date   TRIG 330.0* 02/08/2015   Lab Results  Component Value Date   CHOLHDL 5 02/08/2015   Lab Results  Component Value Date   HGBA1C 6.1 02/08/2015       Assessment & Plan:   Problem List Items Addressed This Visit    GERD    Avoid offending foods, start probiotics. Do not eat large meals in late evening and consider raising head of bed.       Essential hypertension    Well controlled, no changes to meds. Encouraged heart healthy diet such as the DASH diet and exercise as tolerated.       Cystitis    struggling with urinary urgency and frequency but no dysuria. Urine culture is negative, is referred to urology for further consideration      Relevant Orders   Ambulatory referral to Urology   CAP (community acquired pneumonia)    Doing much better, no persistent respiratory complaints.        Other Visit Diagnoses    Frequent urination    -  Primary    Relevant Orders    POCT Urinalysis Dipstick (Completed)    CULTURE, URINE COMPREHENSIVE (Completed)    Ambulatory referral to Urology    Dermatitis        Relevant Medications    triamcinolone ointment (KENALOG) 0.5 %       I am having Ms. Livingston start on triamcinolone ointment. I am also having her maintain her Calcium Carbonate-Vitamin D (CALCIUM + D PO), fluticasone, acetaminophen, HYDROcodone-acetaminophen, diclofenac sodium, Polyethyl Glycol-Propyl Glycol (SYSTANE OP), Krill Oil, albuterol, mupirocin ointment, nitroGLYCERIN, isosorbide mononitrate, hydrochlorothiazide, aspirin, SYMBICORT, potassium chloride, sertraline, Spacer/Aero-Holding Chambers, pantoprazole, rosuvastatin, clopidogrel, losartan, and montelukast.  Meds  ordered this encounter  Medications  . triamcinolone ointment (KENALOG) 0.5 %    Sig: Apply 1 application topically 2 (two) times daily.    Dispense:  30 g    Refill:  2     [name], MD

## 2015-04-08 NOTE — Assessment & Plan Note (Signed)
Well controlled, no changes to meds. Encouraged heart healthy diet such as the DASH diet and exercise as tolerated.  °

## 2015-04-08 NOTE — Assessment & Plan Note (Addendum)
Doing much better, no persistent respiratory complaints.

## 2015-04-30 ENCOUNTER — Encounter: Payer: Self-pay | Admitting: Family Medicine

## 2015-05-08 ENCOUNTER — Other Ambulatory Visit: Payer: Self-pay | Admitting: Cardiovascular Disease

## 2015-05-09 ENCOUNTER — Other Ambulatory Visit: Payer: Self-pay | Admitting: *Deleted

## 2015-05-09 MED ORDER — HYDROCHLOROTHIAZIDE 25 MG PO TABS: 25.0000 mg | ORAL_TABLET | ORAL | Status: DC

## 2015-05-22 ENCOUNTER — Telehealth: Payer: Self-pay | Admitting: *Deleted

## 2015-05-22 NOTE — Telephone Encounter (Signed)
Dr. Temple Pacini. Alva Garnet, please advise if ya'll would be wiling to accept pt? thanks

## 2015-05-22 NOTE — Telephone Encounter (Signed)
Fine with me

## 2015-05-22 NOTE — Telephone Encounter (Signed)
Yes. Accept  Waunita Schooner

## 2015-05-22 NOTE — Telephone Encounter (Signed)
Pt would like to change doctors and would like to come to the [place] office. She states it is closer to this office. Please advise Stated to her once we got the decision then I would call her back.

## 2015-05-22 NOTE — Telephone Encounter (Signed)
Called spoke with pt. appt scheduled to see Dr. Alva Garnet in [place]. Nothing further needed

## 2015-06-27 ENCOUNTER — Encounter: Payer: Self-pay | Admitting: Pulmonary Disease

## 2015-06-27 ENCOUNTER — Ambulatory Visit (INDEPENDENT_AMBULATORY_CARE_PROVIDER_SITE_OTHER): Payer: PPO | Admitting: Pulmonary Disease

## 2015-06-27 ENCOUNTER — Institutional Professional Consult (permissible substitution): Payer: PPO | Admitting: Pulmonary Disease

## 2015-06-27 VITALS — BP 128/64 | HR 77 | Ht 62.0 in | Wt 155.4 lb

## 2015-06-27 DIAGNOSIS — R0600 Dyspnea, unspecified: Secondary | ICD-10-CM

## 2015-06-27 DIAGNOSIS — J449 Chronic obstructive pulmonary disease, unspecified: Secondary | ICD-10-CM

## 2015-06-27 DIAGNOSIS — J45909 Unspecified asthma, uncomplicated: Secondary | ICD-10-CM | POA: Diagnosis not present

## 2015-06-27 MED ORDER — UMECLIDINIUM BROMIDE 62.5 MCG/INH IN AEPB
1.0000 | INHALATION_SPRAY | Freq: Every day | RESPIRATORY_TRACT | Status: AC
Start: 2015-06-27 — End: 2015-06-28

## 2015-06-27 MED ORDER — UMECLIDINIUM BROMIDE 62.5 MCG/INH IN AEPB: 1.0000 | INHALATION_SPRAY | Freq: Every day | RESPIRATORY_TRACT | Status: DC

## 2015-06-27 NOTE — Patient Instructions (Signed)
Continue Symbicort twice a day as you are currently using it Continue Singulair @ night as you are currently - we will talk about stopping this medication on the next visit Begin Incruse - one inhalation daily Continue albuterol inhaler (Proventil) as needed - keep track of how often you require this medication as this will be one of the measures of control of COPD (less use = better control) Follow up in 4-6 weeks with lung function testing

## 2015-06-27 NOTE — Progress Notes (Signed)
PROBLEMS: COPD with significant asthmatic component  FEV1 67% pred 2014 Chronic cough GERD   SUBJ: Previously followed by Dr Melvyn Novas. She has COPD with chronic class 2 dyspnea and chronic NP cough which is sometimes very severe. Her symptoms are worse with exposure to strong odors and cold weather. She is on symbicort which she believes helps and uses albuterol 1-2 times per day. She is on Singulair but is unable to discern whether this medication is of any benefit  OBJ: Filed Vitals:   06/27/15 1425  BP: 128/64  Pulse: 77  Height: '5\' 2"'$  (1.575 m)  Weight: 155 lb 6.4 oz (70.489 kg)  SpO2: 85%   NAD @ rest HEENT WNL No JVD Chest percussion nl, diffuse scattered wheezes Reg, no M NABS No C/C/E  DATA: No new  IMPRESSION: Chronic obstructive pulmonary disease, Asthmatic bronchitis with persistent wheezing, dyspnea and cough  GERD   PLAN: Continue Symbicort twice a day as you are currently using it Continue Singulair @ night as you are currently - we will talk about stopping this medication on the next visit Begin Incruse - one inhalation daily Continue albuterol inhaler (Proventil) as needed - keep track of how often you require this medication as this will be one of the measures of control of COPD (less use = better control) Follow up in 4-6 weeks with lung function testing    Andrea Mcardle, MD [facility] Pulmonary/CCM

## 2015-07-25 ENCOUNTER — Ambulatory Visit (INDEPENDENT_AMBULATORY_CARE_PROVIDER_SITE_OTHER): Payer: PPO | Admitting: *Deleted

## 2015-07-25 DIAGNOSIS — J449 Chronic obstructive pulmonary disease, unspecified: Secondary | ICD-10-CM

## 2015-07-25 DIAGNOSIS — J45909 Unspecified asthma, uncomplicated: Secondary | ICD-10-CM

## 2015-07-25 DIAGNOSIS — R0600 Dyspnea, unspecified: Secondary | ICD-10-CM

## 2015-07-25 LAB — PULMONARY FUNCTION TEST
DL/VA % pred: 56 %
DL/VA: 2.54 ml/min/mmHg/L
DLCO UNC: 17.19 ml/min/mmHg
DLCO unc % pred: 79 %
FEF 25-75 POST: 0.63 L/s
FEF 25-75 Pre: 0.44 L/sec
FEF2575-%Change-Post: 43 %
FEF2575-%PRED-POST: 42 %
FEF2575-%Pred-Pre: 29 %
FEV1-%Change-Post: 7 %
FEV1-%PRED-PRE: 65 %
FEV1-%Pred-Post: 69 %
FEV1-POST: 1.31 L
FEV1-Pre: 1.22 L
FEV1FVC-%Change-Post: 1 %
FEV1FVC-%PRED-PRE: 74 %
FEV6-%CHANGE-POST: 8 %
FEV6-%Pred-Post: 96 %
FEV6-%Pred-Pre: 89 %
FEV6-Post: 2.31 L
FEV6-Pre: 2.14 L
FEV6FVC-%CHANGE-POST: 2 %
FEV6FVC-%Pred-Post: 104 %
FEV6FVC-%Pred-Pre: 102 %
FVC-%CHANGE-POST: 5 %
FVC-%PRED-POST: 92 %
FVC-%PRED-PRE: 87 %
FVC-Post: 2.32 L
FVC-Pre: 2.2 L
POST FEV1/FVC RATIO: 57 %
PRE FEV1/FVC RATIO: 56 %
PRE FEV6/FVC RATIO: 97 %
Post FEV6/FVC ratio: 100 %

## 2015-07-25 NOTE — Progress Notes (Signed)
PFT performed today with nitrogen washout. 

## 2015-07-30 ENCOUNTER — Encounter: Payer: Self-pay | Admitting: Pulmonary Disease

## 2015-07-30 ENCOUNTER — Ambulatory Visit (INDEPENDENT_AMBULATORY_CARE_PROVIDER_SITE_OTHER): Payer: PPO | Admitting: Pulmonary Disease

## 2015-07-30 VITALS — BP 128/82 | HR 60 | Ht 62.0 in | Wt 147.4 lb

## 2015-07-30 DIAGNOSIS — J449 Chronic obstructive pulmonary disease, unspecified: Secondary | ICD-10-CM

## 2015-07-30 MED ORDER — TIOTROPIUM BROMIDE MONOHYDRATE 18 MCG IN CAPS: 18.0000 ug | ORAL_CAPSULE | Freq: Every day | RESPIRATORY_TRACT | Status: DC

## 2015-07-30 MED ORDER — BUDESONIDE-FORMOTEROL FUMARATE 160-4.5 MCG/ACT IN AERO: 2.0000 | INHALATION_SPRAY | Freq: Two times a day (BID) | RESPIRATORY_TRACT | Status: DC

## 2015-07-30 MED ORDER — ALBUTEROL SULFATE HFA 108 (90 BASE) MCG/ACT IN AERS: 2.0000 | INHALATION_SPRAY | Freq: Four times a day (QID) | RESPIRATORY_TRACT | Status: DC | PRN

## 2015-07-30 NOTE — Progress Notes (Signed)
PROBLEMS: COPD with significant asthmatic component  FEV1 67% pred 2014  PFTs 07/25/15: Mild-mod obstruction. FEV1 1.22 > 1.31 liters (65 > 69% predicted) Chronic cough GERD   SUBJ: Incruse was not covered by insurance and she could not afford it. No new complaints. Remains mildly limited by SOB but moderately frustrated. Denies CP, fever, purulent sputum, hemoptysis, LE edema and calf tenderness. Wishes to go through pulmonary rehab  OBJ: Filed Vitals:   07/30/15 0907  BP: 128/82  Pulse: 60  Height: '5\' 2"'$  (1.575 m)  Weight: 147 lb 6.4 oz (66.86 kg)  SpO2: 99%   NAD @ rest HEENT WNL No JVD Chest percussion nl, no wheezes Reg, no M NABS No C/C/E  DATA: PFTs 07/25/15 reviewed  IMPRESSION: Chronic obstructive pulmonary disease with asthmatic component  GERD   PLAN: Continue Symbicort twice a day  Trial of Spiriva to be used @ night as she reports chest tightness and SOB in AM upon awakening Continue Singulair @ night for now Continue albuterol inhaler (Proventil) as needed  Referral to Pulmonary Rehab Follow up in 3 months or PRN   Wilhelmina Mcardle, MD [place] [facility]/CCM

## 2015-08-13 ENCOUNTER — Other Ambulatory Visit: Payer: Self-pay | Admitting: Family Medicine

## 2015-08-28 ENCOUNTER — Encounter: Payer: PPO | Attending: Pulmonary Disease | Admitting: Respiratory Therapy

## 2015-08-28 VITALS — Ht 61.0 in | Wt 145.6 lb

## 2015-08-28 DIAGNOSIS — J449 Chronic obstructive pulmonary disease, unspecified: Secondary | ICD-10-CM | POA: Diagnosis not present

## 2015-08-28 NOTE — Patient Instructions (Signed)
Patient Instructions  Patient Details  Name: Andrea [name] MRN: [id number] Date of Birth: 04-29-1941 Referring Provider:  Wilhelmina Mcardle, MD  Below are the personal goals you chose as well as exercise and nutrition goals. Our goal is to help you keep on track towards obtaining and maintaining your goals. We will be discussing your progress on these goals with you throughout the program.  Initial Exercise Prescription:     Initial Exercise Prescription - 08/28/15 1200    Date of Initial Exercise Prescription   Date 08/28/15   Recumbant Bike   Level 2   RPM 40   Watts 20   Minutes 15   NuStep   Level 2   Watts 40   Minutes 15   Recumbant Elliptical   Level 2   RPM 40   Watts 20   Minutes 15   REL-XR   Level 2   Watts 50   Minutes 15   Biostep-RELP   Level 1   Watts 15   Minutes 15   Prescription Details   Frequency (times per week) 2   Duration Progress to 45 minutes of aerobic exercise without signs/symptoms of physical distress   Intensity   THRR REST +  30   Ratings of Perceived Exertion 11-15   Perceived Dyspnea 0-4   Progression   Progression Continue to progress workloads to maintain intensity without signs/symptoms of physical distress.   Resistance Training   Training Prescription Yes   Weight 2   Reps 10-15      Exercise Goals: Frequency: Be able to perform aerobic exercise three times per week working toward 3-5 days per week.  Intensity: Work with a perceived exertion of 11 (fairly light) - 15 (hard) as tolerated. Follow your new exercise prescription and watch for changes in prescription as you progress with the program. Changes will be reviewed with you when they are made.  Duration: You should be able to do 30 minutes of continuous aerobic exercise in addition to a 5 minute warm-up and a 5 minute cool-down routine.  Nutrition Goals: Your personal nutrition goals will be established when you do your nutrition analysis with the  dietician.  The following are nutrition guidelines to follow: Cholesterol < '200mg'$ /day Sodium < '1500mg'$ /day Fiber: Women over 50 yrs - 21 grams per day  Personal Goals:     Personal Goals and Risk Factors at Admission - 08/28/15 1115    Core Components/Risk Factors/Patient Goals on Admission   Increase Strength and Stamina Yes  Andrea [name] does like to walk, but is limited by her shortness of breath.   Intervention Provide advice, education, support and counseling about physical activity/exercise needs.;Develop an individualized exercise prescription for aerobic and resistive training based on initial evaluation findings, risk stratification, comorbidities and participant's personal goals.  Make one of her exercies goals the treadmill   Expected Outcomes Achievement of increased cardiorespiratory fitness and enhanced flexibility, muscular endurance and strength shown through measurements of functional capacity and personal statement of participant.   Improve shortness of breath with ADL's Yes   Intervention Provide education, individualized exercise plan and daily activity instruction to help decrease symptoms of SOB with activities of daily living.   Expected Outcomes Short Term: Achieves a reduction of symptoms when performing activities of daily living.   Develop more efficient breathing techniques such as purse lipped breathing and diaphragmatic breathing; and practicing self-pacing with activity Yes   Intervention Provide education, demonstration and support about specific breathing techniuqes utilized for  more efficient breathing. Include techniques such as pursed lipped breathing, diaphragmatic breathing and self-pacing activity.   Expected Outcomes Short Term: Participant will be able to demonstrate and use breathing techniques as needed throughout daily activities.   Increase knowledge of respiratory medications and ability to use respiratory devices properly  Yes  Andrea [name] uses  Albuterol, Symbicort, and Spiriva; she has a spacer.   Intervention Provide education and demonstration as needed of appropriate use of medications, inhalers, and oxygen therapy.   Expected Outcomes Short Term: Achieves understanding of medications use. Understands that oxygen is a medication prescribed by physician. Demonstrates appropriate use of inhaler and oxygen therapy.   Hypertension Yes   Intervention Provide education on lifestyle modifcations including regular physical activity/exercise, weight management, moderate sodium restriction and increased consumption of fresh fruit, vegetables, and low fat dairy, alcohol moderation, and smoking cessation.;Monitor prescription use compliance.   Expected Outcomes Short Term: Continued assessment and intervention until BP is < 140/28m HG in hypertensive participants. < 130/866mHG in hypertensive participants with diabetes, heart failure or chronic kidney disease.;Long Term: Maintenance of blood pressure at goal levels.      Tobacco Use Initial Evaluation: History  Smoking status   Former Smoker -- 0.50 packs/day for 40 years   Types: Cigarettes   Quit date: 01/19/1993  Smokeless tobacco   Never Used    Copy of goals given to participant.

## 2015-08-28 NOTE — Progress Notes (Signed)
Pulmonary Individual Treatment Plan  Patient Details  Name: Andrea [name] MRN: [id number] Date of Birth: 1941/02/07 Referring Provider:  Wilhelmina Mcardle, MD  Initial Encounter Date:       Pulmonary Rehab from 08/28/2015 in [facility] Cardiac and Pulmonary Rehab   Date  08/28/15      Visit Diagnosis: COPD, moderate ([place])  Patient's Home Medications on Admission:  Current outpatient prescriptions:    acetaminophen (TYLENOL) 650 MG CR tablet, Take 1,300 mg by mouth 2 (two) times daily., Disp: , Rfl:    albuterol (PROVENTIL HFA;VENTOLIN HFA) 108 (90 Base) MCG/ACT inhaler, Inhale 2 puffs into the lungs every 6 (six) hours as needed for wheezing., Disp: 1 Inhaler, Rfl: 11   aspirin 81 MG tablet, Take 81 mg by mouth daily., Disp: , Rfl:    budesonide-formoterol (SYMBICORT) 160-4.5 MCG/ACT inhaler, Inhale 2 puffs into the lungs 2 (two) times daily., Disp: 1 Inhaler, Rfl: 0   Calcium Carbonate-Vitamin D (CALCIUM + D PO), Take 1 tablet by mouth 2 (two) times daily., Disp: , Rfl:    clopidogrel (PLAVIX) 75 MG tablet, TAKE 1 TABLET (75 MG TOTAL) BY MOUTH DAILY., Disp: 90 tablet, Rfl: 3   diclofenac sodium (VOLTAREN) 1 % GEL, Apply 2 g topically 3 (three) times daily as needed (pain)., Disp: , Rfl:    fluticasone (FLONASE) 50 MCG/ACT nasal spray, Place 2 sprays into the nose 2 (two) times daily as needed. For congestion, Disp: , Rfl:    hydrochlorothiazide (HYDRODIURIL) 25 MG tablet, Take 1 tablet (25 mg total) by mouth every morning., Disp: 90 tablet, Rfl: 3   HYDROcodone-acetaminophen (NORCO/VICODIN) 5-325 MG per tablet, Take 1 tablet by mouth 2 (two) times daily as needed for pain., Disp: 30 tablet, Rfl: 1   isosorbide mononitrate (IMDUR) 30 MG 24 hr tablet, TAKE 1 TABLET (30 MG TOTAL) BY MOUTH DAILY., Disp: 90 tablet, Rfl: 3   Krill Oil 1000 MG CAPS, Take 1,000 mg by mouth daily., Disp: , Rfl:    losartan (COZAAR) 100 MG tablet, TAKE 1 TABLET (100 MG TOTAL) BY MOUTH DAILY., Disp: 90  tablet, Rfl: 3   montelukast (SINGULAIR) 10 MG tablet, TAKE 1 TABLET BY MOUTH AT BEDTIME, Disp: 30 tablet, Rfl: 2   mupirocin ointment (BACTROBAN) 2 %, Place 1 application into the nose daily. Via qtip to ear daily, Disp: 22 g, Rfl: 0   nitroGLYCERIN (NITROSTAT) 0.4 MG SL tablet, Place 1 tablet (0.4 mg total) under the tongue every 5 (five) minutes as needed for chest pain., Disp: 25 tablet, Rfl: 6   pantoprazole (PROTONIX) 40 MG tablet, TAKE 1 TABLET (40 MG TOTAL) BY MOUTH DAILY., Disp: 90 tablet, Rfl: 3   Polyethyl Glycol-Propyl Glycol (SYSTANE OP), Place 1 drop into both eyes daily as needed (dry eyes)., Disp: , Rfl:    potassium chloride (K-DUR) 10 MEQ tablet, Take 1 tablet (10 mEq total) by mouth daily as needed., Disp: 90 tablet, Rfl: 3   rosuvastatin (CRESTOR) 40 MG tablet, Take 1 tablet (40 mg total) by mouth daily., Disp: 90 tablet, Rfl: 3   sertraline (ZOLOFT) 50 MG tablet, TAKE 1 TABLET EVERY DAY **NEED OFFICE VISIT, Disp: 90 tablet, Rfl: 1   Spacer/Aero-Holding Chambers DEVI, 1 spacer to use prn with HFA device as directed, Disp: 1 each, Rfl: 0   SYMBICORT 160-4.5 MCG/ACT inhaler, INHALE 2 PUFFS INTO THE LUNGS 2 (TWO) TIMES DAILY., Disp: 10.2 Inhaler, Rfl: 2   tiotropium (SPIRIVA) 18 MCG inhalation capsule, Place 1 capsule (18 mcg total) into  inhaler and inhale daily., Disp: 30 capsule, Rfl: 11   triamcinolone ointment (KENALOG) 0.5 %, Apply 1 application topically 2 (two) times daily., Disp: 30 g, Rfl: 2  Past Medical History: Past Medical History  Diagnosis Date   COPD (chronic obstructive pulmonary disease) ([place])    Cerebrovascular disease 01/2009    carotid u/s  R 0-39%   L 60-79%   CAD (coronary artery disease)     s/CABG (reports IMA and 2 SVGs) back in 1999  Myoview normal 3/10; s/p PCI with DES to PL branch of distal RCA 09/2011   Depression    GERD (gastroesophageal reflux disease)    Hypertension    Hyperlipidemia    Osteoporosis    PVD (peripheral  vascular disease) ([facility])    Dizziness    AAA (abdominal aortic aneurysm) ([place]) 01/2009    AAA (2.8 x 3.0)  moderate RAS (left); 2.7 x 2.7 cm (07/11/05)   NSTEMI (non-ST elevated myocardial infarction) ([place]) 11/12    Cath showed atretic IMA graft to the LAD, SVG to PD was patent but the continuation of this graft to the PL branch was occluded; there were L to R collaterals; Mid LAD had a 60 to 70% stenosis. She has been treated medically.  Neg Myoview 05/2011   Bradycardia     Metoprolol stopped 08/2011   Angina    Dysrhythmia     hx of sinus brady   Shortness of breath    Blood transfusion     hx of transfusion    Herniated lumbar disc without myelopathy    Anosmia    Acute bronchitis 03/20/2013   Medicare annual wellness visit, subsequent 07/31/2013   Thoracic back pain 01/24/2014   Baker's cyst of knee 01/30/2014    Tobacco Use: History  Smoking status   Former Smoker -- 0.50 packs/day for 40 years   Types: Cigarettes   Quit date: 01/19/1993  Smokeless tobacco   Never Used    Labs: Recent Review Flowsheet Data    Labs for ITP Cardiac and Pulmonary Rehab Latest Ref Rng 06/07/2012 03/17/2013 07/26/2013 01/24/2014 02/08/2015   Cholestrol 0 - 200 mg/dL 122 306(H) 135 136 144   LDLCALC 0 - 99 mg/dL 42 199(H) 61 75 -   LDLDIRECT - - - - - 83.0   HDL >39.00 mg/dL 30(L) 34(L) 31(L) 34.50(L) 30.90(L)   Trlycerides 0.0 - 149.0 mg/dL 250(H) 364(H) 214(H) 134.0 330.0(H)   Hemoglobin A1c 4.6 - 6.5 % - - - - 6.1       ADL UCSD:     Pulmonary Assessment Scores      08/28/15 1115       ADL UCSD   ADL Phase Entry     SOB Score total 17     Rest 1     Walk 1     Stairs 2     Bath 0     Dress 0     Shop 1        Pulmonary Function Assessment:     Pulmonary Function Assessment - 08/28/15 1115    Pulmonary Function Tests   RV% 69 %   DLCO% 79 %   Initial Spirometry Results   FVC% 87 %   FEV1% 65 %   FEV1/FVC Ratio 56   Comments Performed 07/25/2015   Post  Bronchodilator Spirometry Results   FVC% 92 %   FEV1% 69 %   FEV1/FVC Ratio 57   Breath   Bilateral Breath Sounds Decreased;Clear  Shortness of Breath Yes;Limiting activity      Exercise Target Goals: Date: 08/28/15  Exercise Program Goal: Individual exercise prescription set with THRR, safety & activity barriers. Participant demonstrates ability to understand and report RPE using BORG scale, to self-measure pulse accurately, and to acknowledge the importance of the exercise prescription.  Exercise Prescription Goal: Starting with aerobic activity 30 plus minutes a day, 3 days per week for initial exercise prescription. Provide home exercise prescription and guidelines that participant acknowledges understanding prior to discharge.  Activity Barriers & Risk Stratification:     Activity Barriers & Cardiac Risk Stratification - 08/28/15 1115    Activity Barriers & Cardiac Risk Stratification   Activity Barriers Back Problems;Shortness of Breath;Deconditioning   Cardiac Risk Stratification Moderate      6 Minute Walk:     6 Minute Walk      08/28/15 1216       6 Minute Walk   Phase Initial     Distance 1500 feet     Walk Time 6 minutes     # of Rest Breaks 0     MPH 2.8     RPE 13     Perceived Dyspnea  3     Resting HR 56 bpm     Resting BP 100/60 mmHg     Max Ex. HR 81 bpm     Max Ex. BP 140/68 mmHg        Initial Exercise Prescription:     Initial Exercise Prescription - 08/28/15 1200    Date of Initial Exercise Prescription   Date 08/28/15   Recumbant Bike   Level 2   RPM 40   Watts 20   Minutes 15   NuStep   Level 2   Watts 40   Minutes 15   Recumbant Elliptical   Level 2   RPM 40   Watts 20   Minutes 15   REL-XR   Level 2   Watts 50   Minutes 15   Biostep-RELP   Level 1   Watts 15   Minutes 15   Prescription Details   Frequency (times per week) 2   Duration Progress to 45 minutes of aerobic exercise without signs/symptoms of  physical distress   Intensity   THRR REST +  30   Ratings of Perceived Exertion 11-15   Perceived Dyspnea 0-4   Progression   Progression Continue to progress workloads to maintain intensity without signs/symptoms of physical distress.   Resistance Training   Training Prescription Yes   Weight 2   Reps 10-15      Perform Capillary Blood Glucose checks as needed.  Exercise Prescription Changes:   Exercise Comments:   Discharge Exercise Prescription (Final Exercise Prescription Changes):    Nutrition:  Target Goals: Understanding of nutrition guidelines, daily intake of sodium '1500mg'$ , cholesterol '200mg'$ , calories 30% from fat and 7% or less from saturated fats, daily to have 5 or more servings of fruits and vegetables.  Biometrics:     Pre Biometrics - 08/28/15 1218    Pre Biometrics   Height '5\' 1"'$  (1.549 m)   Weight 145 lb 9.6 oz (66.044 kg)   Waist Circumference 34 inches   Hip Circumference 41 inches   Waist to Hip Ratio 0.83 %   BMI (Calculated) 27.6       Nutrition Therapy Plan and Nutrition Goals:     Nutrition Therapy & Goals - 08/28/15 1115    Nutrition Therapy   Diet  Prefers not to meet with the dietitian; She is participating in [employer] and has lost 10lbs with 10lbs more to go.      Nutrition Discharge: Rate Your Plate Scores:   Psychosocial: Target Goals: Acknowledge presence or absence of depression, maximize coping skills, provide positive support system. Participant is able to verbalize types and ability to use techniques and skills needed for reducing stress and depression.  Initial Review & Psychosocial Screening:     Initial Psych Review & Screening - 08/28/15 1115    Family Dynamics   Good Support System? Yes   Comments Ms Creswell has some support from her husband, although he does have back problems and is unable to help with any work around the house. She does have some depression related to her shortness of breath and  inability to do some activities she did before.                                    Barriers   Psychosocial barriers to participate in program The patient should benefit from training in stress management and relaxation.   Screening Interventions   Interventions Encouraged to exercise;Program counselor consult      Quality of Life Scores:     Quality of Life - 08/28/15 1115    Quality of Life Scores   Health/Function Pre 20.87 %   Socioeconomic Pre 28.58 %   Psych/Spiritual Pre 27.43 %   Family Pre 27.6 %   GLOBAL Pre 24.68 %      PHQ-9:     Recent Review Flowsheet Data    Depression screen [facility] 2/9 08/28/2015 02/08/2015 07/31/2013 07/26/2013   Decreased Interest 0 0 0 0   Down, Depressed, Hopeless 1 0 0 0   PHQ - 2 Score 1 0 0 0   Altered sleeping 1 0 - -   Tired, decreased energy 2 0 - -   Change in appetite 1 0 - -   Feeling bad or failure about yourself  0 0 - -   Trouble concentrating 1 0 - -   Moving slowly or fidgety/restless 0 0 - -   Suicidal thoughts 0 0 - -   PHQ-9 Score 6 0 - -   Difficult doing work/chores Somewhat difficult Not difficult at all - -      Psychosocial Evaluation and Intervention:   Psychosocial Re-Evaluation:  Education: Education Goals: Education classes will be provided on a weekly basis, covering required topics. Participant will state understanding/return demonstration of topics presented.  Learning Barriers/Preferences:     Learning Barriers/Preferences - 08/28/15 1115    Learning Barriers/Preferences   Learning Barriers None   Learning Preferences Group Instruction;Individual Instruction;Pictoral;Skilled Demonstration;Verbal Instruction;Video;Written Material      Education Topics: Initial Evaluation Education: - Verbal, written and demonstration of respiratory meds, RPE/PD scales, oximetry and breathing techniques. Instruction on use of nebulizers and MDIs: cleaning and proper use, rinsing mouth with steroid doses and importance  of monitoring MDI activations.          Pulmonary Rehab from 08/28/2015 in [facility] Cardiac and Pulmonary Rehab   Date  08/28/15   Educator  LB   Instruction Review Code  2- meets goals/outcomes      General Nutrition Guidelines/Fats and Fiber: -Group instruction provided by verbal, written material, models and posters to present the general guidelines for heart healthy nutrition. Gives an explanation and review of dietary fats  and fiber.   Controlling Sodium/Reading Food Labels: -Group verbal and written material supporting the discussion of sodium use in heart healthy nutrition. Review and explanation with models, verbal and written materials for utilization of the food label.   Exercise Physiology & Risk Factors: - Group verbal and written instruction with models to review the exercise physiology of the cardiovascular system and associated critical values. Details cardiovascular disease risk factors and the goals associated with each risk factor.   Aerobic Exercise & Resistance Training: - Gives group verbal and written discussion on the health impact of inactivity. On the components of aerobic and resistive training programs and the benefits of this training and how to safely progress through these programs.   Flexibility, Balance, General Exercise Guidelines: - Provides group verbal and written instruction on the benefits of flexibility and balance training programs. Provides general exercise guidelines with specific guidelines to those with heart or lung disease. Demonstration and skill practice provided.   Stress Management: - Provides group verbal and written instruction about the health risks of elevated stress, cause of high stress, and healthy ways to reduce stress.   Depression: - Provides group verbal and written instruction on the correlation between heart/lung disease and depressed mood, treatment options, and the stigmas associated with seeking treatment.   Exercise &  Equipment Safety: - Individual verbal instruction and demonstration of equipment use and safety with use of the equipment.   Infection Prevention: - Provides verbal and written material to individual with discussion of infection control including proper hand washing and proper equipment cleaning during exercise session.   Falls Prevention: - Provides verbal and written material to individual with discussion of falls prevention and safety.      Pulmonary Rehab from 08/28/2015 in [facility] Cardiac and Pulmonary Rehab   Date  08/28/15   Educator  LB   Instruction Review Code  2- meets goals/outcomes      Diabetes: - Individual verbal and written instruction to review signs/symptoms of diabetes, desired ranges of glucose level fasting, after meals and with exercise. Advice that pre and post exercise glucose checks will be done for 3 sessions at entry of program.   Chronic Lung Diseases: - Group verbal and written instruction to review new updates, new respiratory medications, new advancements in procedures and treatments. Provide informative websites and "800" numbers of self-education.   Lung Procedures: - Group verbal and written instruction to describe testing methods done to diagnose lung disease. Review the outcome of test results. Describe the treatment choices: Pulmonary Function Tests, ABGs and oximetry.   Energy Conservation: - Provide group verbal and written instruction for methods to conserve energy, plan and organize activities. Instruct on pacing techniques, use of adaptive equipment and posture/positioning to relieve shortness of breath.   Triggers: - Group verbal and written instruction to review types of environmental controls: home humidity, furnaces, filters, dust mite/pet prevention, HEPA vacuums. To discuss weather changes, air quality and the benefits of nasal washing.   Exacerbations: - Group verbal and written instruction to provide: warning signs, infection  symptoms, calling MD promptly, preventive modes, and value of vaccinations. Review: effective airway clearance, coughing and/or vibration techniques. Create an Sports administrator.   Oxygen: - Individual and group verbal and written instruction on oxygen therapy. Includes supplement oxygen, available portable oxygen systems, continuous and intermittent flow rates, oxygen safety, concentrators, and Medicare reimbursement for oxygen.   Respiratory Medications: - Group verbal and written instruction to review medications for lung disease. Drug class, frequency, complications, importance  of spacers, rinsing mouth after steroid MDI's, and proper cleaning methods for nebulizers.      Pulmonary Rehab from 08/28/2015 in [facility] Cardiac and Pulmonary Rehab   Date  08/28/15   Educator  LB   Instruction Review Code  2- meets goals/outcomes      AED/CPR: - Group verbal and written instruction with the use of models to demonstrate the basic use of the AED with the basic ABC's of resuscitation.   Breathing Retraining: - Provides individuals verbal and written instruction on purpose, frequency, and proper technique of diaphragmatic breathing and pursed-lipped breathing. Applies individual practice skills.      Pulmonary Rehab from 08/28/2015 in [facility] Cardiac and Pulmonary Rehab   Date  08/28/15   Educator  LB   Instruction Review Code  2- meets goals/outcomes      Anatomy and Physiology of the Lungs: - Group verbal and written instruction with the use of models to provide basic lung anatomy and physiology related to function, structure and complications of lung disease.   Heart Failure: - Group verbal and written instruction on the basics of heart failure: signs/symptoms, treatments, explanation of ejection fraction, enlarged heart and cardiomyopathy.   Sleep Apnea: - Individual verbal and written instruction to review Obstructive Sleep Apnea. Review of risk factors, methods for diagnosing and types of masks  and machines for OSA.   Anxiety: - Provides group, verbal and written instruction on the correlation between heart/lung disease and anxiety, treatment options, and management of anxiety.   Relaxation: - Provides group, verbal and written instruction about the benefits of relaxation for patients with heart/lung disease. Also provides patients with examples of relaxation techniques.   Knowledge Questionnaire Score:     Knowledge Questionnaire Score - 08/28/15 1115    Knowledge Questionnaire Score   Pre Score 8/10       Core Components/Risk Factors/Patient Goals at Admission:     Personal Goals and Risk Factors at Admission - 08/28/15 1115    Core Components/Risk Factors/Patient Goals on Admission   Increase Strength and Stamina Yes  Ms Grussing does like to walk, but is limited by her shortness of breath.   Intervention Provide advice, education, support and counseling about physical activity/exercise needs.;Develop an individualized exercise prescription for aerobic and resistive training based on initial evaluation findings, risk stratification, comorbidities and participant's personal goals.  Make one of her exercies goals the treadmill   Expected Outcomes Achievement of increased cardiorespiratory fitness and enhanced flexibility, muscular endurance and strength shown through measurements of functional capacity and personal statement of participant.   Improve shortness of breath with ADL's Yes   Intervention Provide education, individualized exercise plan and daily activity instruction to help decrease symptoms of SOB with activities of daily living.   Expected Outcomes Short Term: Achieves a reduction of symptoms when performing activities of daily living.   Develop more efficient breathing techniques such as purse lipped breathing and diaphragmatic breathing; and practicing self-pacing with activity Yes   Intervention Provide education, demonstration and support about specific  breathing techniuqes utilized for more efficient breathing. Include techniques such as pursed lipped breathing, diaphragmatic breathing and self-pacing activity.   Expected Outcomes Short Term: Participant will be able to demonstrate and use breathing techniques as needed throughout daily activities.   Increase knowledge of respiratory medications and ability to use respiratory devices properly  Yes  Ms Ghan uses Albuterol, Symbicort, and Spiriva; she has a spacer.   Intervention Provide education and demonstration as needed of appropriate use  of medications, inhalers, and oxygen therapy.   Expected Outcomes Short Term: Achieves understanding of medications use. Understands that oxygen is a medication prescribed by physician. Demonstrates appropriate use of inhaler and oxygen therapy.   Hypertension Yes   Intervention Provide education on lifestyle modifcations including regular physical activity/exercise, weight management, moderate sodium restriction and increased consumption of fresh fruit, vegetables, and low fat dairy, alcohol moderation, and smoking cessation.;Monitor prescription use compliance.   Expected Outcomes Short Term: Continued assessment and intervention until BP is < 140/48m HG in hypertensive participants. < 130/855mHG in hypertensive participants with diabetes, heart failure or chronic kidney disease.;Long Term: Maintenance of blood pressure at goal levels.      Core Components/Risk Factors/Patient Goals Review:    Core Components/Risk Factors/Patient Goals at Discharge (Final Review):    ITP Comments:   Comments: Ms CaLevaeh Vicelans to start on 09/03/2015 and attend 2 days/week. [place] has a copay [place] [facility].

## 2015-09-03 ENCOUNTER — Encounter: Payer: Self-pay | Admitting: *Deleted

## 2015-09-03 ENCOUNTER — Encounter: Payer: PPO | Admitting: *Deleted

## 2015-09-03 DIAGNOSIS — J449 Chronic obstructive pulmonary disease, unspecified: Secondary | ICD-10-CM

## 2015-09-03 NOTE — Progress Notes (Signed)
Daily Session Note  Patient Details  Name: Parish Augustine MRN: [id number] Date of Birth: November 30, 1940 Referring Provider:  Wilhelmina Mcardle, MD  Encounter Date: 09/03/2015  Check In:     Session Check In - 09/03/15 1247    Check-In   Location [facility]-Cardiac & Pulmonary Rehab   Staff Present Nyoka Cowden, RN;Purl Claytor, RN, BSN   Supervising physician immediately available to respond to emergencies See telemetry face sheet for immediately available ER MD   Physician(s) Dr. Mariea Clonts and Dr. Corky Downs   Medication changes reported     No   Fall or balance concerns reported    No   Warm-up and Cool-down Performed on first and last piece of equipment   Resistance Training Performed No   VAD Patient? No   Pain Assessment   Currently in Pain? No/denies         Goals Met:  Proper associated with RPD/PD & O2 Sat Exercise tolerated well  Goals Unmet:  Not Applicable  Comments:    Dr. Emily Filbert is Medical Director for [place] and [facility].

## 2015-09-05 ENCOUNTER — Ambulatory Visit: Payer: PPO

## 2015-09-07 ENCOUNTER — Ambulatory Visit: Payer: PPO

## 2015-09-10 ENCOUNTER — Encounter: Payer: Self-pay | Admitting: *Deleted

## 2015-09-10 ENCOUNTER — Ambulatory Visit: Payer: PPO

## 2015-09-10 NOTE — Progress Notes (Signed)
Pulmonary Individual Treatment Plan  Patient Details  Name: Andrea [name] MRN: [id number] Date of Birth: 16-Dec-1940 Referring Provider:    Initial Encounter Date:       Pulmonary Rehab from 08/28/2015 in [facility] Cardiac and Pulmonary Rehab   Date  08/28/15      Visit Diagnosis: COPD, moderate ([place])  Patient's Home Medications on Admission:  Current outpatient prescriptions:  .  acetaminophen (TYLENOL) 650 MG CR tablet, Take 1,300 mg by mouth 2 (two) times daily., Disp: , Rfl:  .  albuterol (PROVENTIL HFA;VENTOLIN HFA) 108 (90 Base) MCG/ACT inhaler, Inhale 2 puffs into the lungs every 6 (six) hours as needed for wheezing., Disp: 1 Inhaler, Rfl: 11 .  aspirin 81 MG tablet, Take 81 mg by mouth daily., Disp: , Rfl:  .  budesonide-formoterol (SYMBICORT) 160-4.5 MCG/ACT inhaler, Inhale 2 puffs into the lungs 2 (two) times daily., Disp: 1 Inhaler, Rfl: 0 .  Calcium Carbonate-Vitamin D (CALCIUM + D PO), Take 1 tablet by mouth 2 (two) times daily., Disp: , Rfl:  .  clopidogrel (PLAVIX) 75 MG tablet, TAKE 1 TABLET (75 MG TOTAL) BY MOUTH DAILY., Disp: 90 tablet, Rfl: 3 .  diclofenac sodium (VOLTAREN) 1 % GEL, Apply 2 g topically 3 (three) times daily as needed (pain)., Disp: , Rfl:  .  fluticasone (FLONASE) 50 MCG/ACT nasal spray, Place 2 sprays into the nose 2 (two) times daily as needed. For congestion, Disp: , Rfl:  .  hydrochlorothiazide (HYDRODIURIL) 25 MG tablet, Take 1 tablet (25 mg total) by mouth every morning., Disp: 90 tablet, Rfl: 3 .  HYDROcodone-acetaminophen (NORCO/VICODIN) 5-325 MG per tablet, Take 1 tablet by mouth 2 (two) times daily as needed for pain., Disp: 30 tablet, Rfl: 1 .  isosorbide mononitrate (IMDUR) 30 MG 24 hr tablet, TAKE 1 TABLET (30 MG TOTAL) BY MOUTH DAILY., Disp: 90 tablet, Rfl: 3 .  Krill Oil 1000 MG CAPS, Take 1,000 mg by mouth daily., Disp: , Rfl:  .  losartan (COZAAR) 100 MG tablet, TAKE 1 TABLET (100 MG TOTAL) BY MOUTH DAILY., Disp: 90 tablet, Rfl: 3 .   montelukast (SINGULAIR) 10 MG tablet, TAKE 1 TABLET BY MOUTH AT BEDTIME, Disp: 30 tablet, Rfl: 2 .  mupirocin ointment (BACTROBAN) 2 %, Place 1 application into the nose daily. Via qtip to ear daily, Disp: 22 g, Rfl: 0 .  nitroGLYCERIN (NITROSTAT) 0.4 MG SL tablet, Place 1 tablet (0.4 mg total) under the tongue every 5 (five) minutes as needed for chest pain., Disp: 25 tablet, Rfl: 6 .  pantoprazole (PROTONIX) 40 MG tablet, TAKE 1 TABLET (40 MG TOTAL) BY MOUTH DAILY., Disp: 90 tablet, Rfl: 3 .  Polyethyl Glycol-Propyl Glycol (SYSTANE OP), Place 1 drop into both eyes daily as needed (dry eyes)., Disp: , Rfl:  .  potassium chloride (K-DUR) 10 MEQ tablet, Take 1 tablet (10 mEq total) by mouth daily as needed., Disp: 90 tablet, Rfl: 3 .  rosuvastatin (CRESTOR) 40 MG tablet, Take 1 tablet (40 mg total) by mouth daily., Disp: 90 tablet, Rfl: 3 .  sertraline (ZOLOFT) 50 MG tablet, TAKE 1 TABLET EVERY DAY **NEED OFFICE VISIT, Disp: 90 tablet, Rfl: 1 .  Spacer/Aero-Holding Chambers DEVI, 1 spacer to use prn with HFA device as directed, Disp: 1 each, Rfl: 0 .  SYMBICORT 160-4.5 MCG/ACT inhaler, INHALE 2 PUFFS INTO THE LUNGS 2 (TWO) TIMES DAILY., Disp: 10.2 Inhaler, Rfl: 2 .  tiotropium (SPIRIVA) 18 MCG inhalation capsule, Place 1 capsule (18 mcg total) into inhaler and inhale  daily., Disp: 30 capsule, Rfl: 11 .  triamcinolone ointment (KENALOG) 0.5 %, Apply 1 application topically 2 (two) times daily., Disp: 30 g, Rfl: 2  Past Medical History: Past Medical History  Diagnosis Date  . COPD (chronic obstructive pulmonary disease) ([place])   . Cerebrovascular disease 01/2009    carotid u/s  R 0-39%   L 60-79%  . CAD (coronary artery disease)     s/CABG (reports IMA and 2 SVGs) back in 1999  Myoview normal 3/10; s/p PCI with DES to PL branch of distal RCA 09/2011  . Depression   . GERD (gastroesophageal reflux disease)   . Hypertension   . Hyperlipidemia   . Osteoporosis   . PVD (peripheral vascular disease)  ([place])   . Dizziness   . AAA (abdominal aortic aneurysm) ([place]) 01/2009    AAA (2.8 x 3.0)  moderate RAS (left); 2.7 x 2.7 cm (07/11/05)  . NSTEMI (non-ST elevated myocardial infarction) ([place]) 11/12    Cath showed atretic IMA graft to the LAD, SVG to PD was patent but the continuation of this graft to the PL branch was occluded; there were L to R collaterals; Mid LAD had a 60 to 70% stenosis. She has been treated medically.  Neg Myoview 05/2011  . Bradycardia     Metoprolol stopped 08/2011  . Angina   . Dysrhythmia     hx of sinus brady  . Shortness of breath   . Blood transfusion     hx of transfusion   . Herniated lumbar disc without myelopathy   . Anosmia   . Acute bronchitis 03/20/2013  . Medicare annual wellness visit, subsequent 07/31/2013  . Thoracic back pain 01/24/2014  . Baker's cyst of knee 01/30/2014    Tobacco Use: History  Smoking status  . Former Smoker -- 0.50 packs/day for 40 years  . Types: Cigarettes  . Quit date: 01/19/1993  Smokeless tobacco  . Never Used    Labs: Recent Review Flowsheet Data    Labs for ITP Cardiac and Pulmonary Rehab Latest Ref Rng 06/07/2012 03/17/2013 07/26/2013 01/24/2014 02/08/2015   Cholestrol 0 - 200 mg/dL 122 306(H) 135 136 144   LDLCALC 0 - 99 mg/dL 42 199(H) 61 75 -   LDLDIRECT - - - - - 83.0   HDL >39.00 mg/dL 30(L) 34(L) 31(L) 34.50(L) 30.90(L)   Trlycerides 0.0 - 149.0 mg/dL 250(H) 364(H) 214(H) 134.0 330.0(H)   Hemoglobin A1c 4.6 - 6.5 % - - - - 6.1       ADL UCSD:     Pulmonary Assessment Scores      08/28/15 1115       ADL UCSD   ADL Phase Entry     SOB Score total 17     Rest 1     Walk 1     Stairs 2     Bath 0     Dress 0     Shop 1        Pulmonary Function Assessment:     Pulmonary Function Assessment - 08/28/15 1115    Pulmonary Function Tests   RV% 69 %   DLCO% 79 %   Initial Spirometry Results   FVC% 87 %   FEV1% 65 %   FEV1/FVC Ratio 56   Comments Performed 07/25/2015   Post Bronchodilator  Spirometry Results   FVC% 92 %   FEV1% 69 %   FEV1/FVC Ratio 57   Breath   Bilateral Breath Sounds Decreased;Clear   Shortness  of Breath Yes;Limiting activity      Exercise Target Goals:    Exercise Program Goal: Individual exercise prescription set with THRR, safety & activity barriers. Participant demonstrates ability to understand and report RPE using BORG scale, to self-measure pulse accurately, and to acknowledge the importance of the exercise prescription.  Exercise Prescription Goal: Starting with aerobic activity 30 plus minutes a day, 3 days per week for initial exercise prescription. Provide home exercise prescription and guidelines that participant acknowledges understanding prior to discharge.  Activity Barriers & Risk Stratification:     Activity Barriers & Cardiac Risk Stratification - 08/28/15 1115    Activity Barriers & Cardiac Risk Stratification   Activity Barriers Back Problems;Shortness of Breath;Deconditioning   Cardiac Risk Stratification Moderate      6 Minute Walk:     6 Minute Walk      08/28/15 1216       6 Minute Walk   Phase Initial     Distance 1500 feet     Walk Time 6 minutes     # of Rest Breaks 0     MPH 2.8     RPE 13     Perceived Dyspnea  3     Resting HR 56 bpm     Resting BP 100/60 mmHg     Max Ex. HR 81 bpm     Max Ex. BP 140/68 mmHg        Initial Exercise Prescription:     Initial Exercise Prescription - 08/28/15 1200    Date of Initial Exercise RX and Referring Provider   Date 08/28/15   Recumbant Bike   Level 2   RPM 40   Watts 20   Minutes 15   NuStep   Level 2   Watts 40   Minutes 15   Recumbant Elliptical   Level 2   RPM 40   Watts 20   Minutes 15   REL-XR   Level 2   Watts 50   Minutes 15   Biostep-RELP   Level 1   Watts 15   Minutes 15   Prescription Details   Frequency (times per week) 2   Duration Progress to 45 minutes of aerobic exercise without signs/symptoms of physical distress    Intensity   THRR REST +  30   Ratings of Perceived Exertion 11-15   Perceived Dyspnea 0-4   Progression   Progression Continue to progress workloads to maintain intensity without signs/symptoms of physical distress.   Resistance Training   Training Prescription Yes   Weight 2   Reps 10-15      Perform Capillary Blood Glucose checks as needed.  Exercise Prescription Changes:     Exercise Prescription Changes      09/03/15 1221           Response to Exercise   Blood Pressure (Admit) 122/62 mmHg       Blood Pressure (Exercise) 132/60 mmHg       Blood Pressure (Exit) 98/64 mmHg       Heart Rate (Admit) 62 bpm       Heart Rate (Exercise) 100 bpm       Heart Rate (Exit) 56 bpm       Oxygen Saturation (Admit) 95 %  Room air       Oxygen Saturation (Exercise) 91 %  room air       Oxygen Saturation (Exit) 98 %  Room air       Rating of  Perceived Exertion (Exercise) 11       Perceived Dyspnea (Exercise) 2       Symptoms --  None       Duration Progress to 30 minutes of continuous aerobic without signs/symptoms of physical distress       Intensity Rest + 30       Resistance Training   Weight 2       Reps 10-15       Treadmill   MPH 2       Grade 1       Minutes 15       NuStep   Level 2       Watts 40       Minutes 15       REL-XR   Level 2       Watts 50       Minutes 15          Exercise Comments:   Discharge Exercise Prescription (Final Exercise Prescription Changes):     Exercise Prescription Changes - 09/03/15 1221    Response to Exercise   Blood Pressure (Admit) 122/62 mmHg   Blood Pressure (Exercise) 132/60 mmHg   Blood Pressure (Exit) 98/64 mmHg   Heart Rate (Admit) 62 bpm   Heart Rate (Exercise) 100 bpm   Heart Rate (Exit) 56 bpm   Oxygen Saturation (Admit) 95 %  Room air   Oxygen Saturation (Exercise) 91 %  room air   Oxygen Saturation (Exit) 98 %  Room air   Rating of Perceived Exertion (Exercise) 11   Perceived Dyspnea (Exercise) 2    Symptoms --  None   Duration Progress to 30 minutes of continuous aerobic without signs/symptoms of physical distress   Intensity Rest + 30   Resistance Training   Weight 2   Reps 10-15   Treadmill   MPH 2   Grade 1   Minutes 15   NuStep   Level 2   Watts 40   Minutes 15   REL-XR   Level 2   Watts 50   Minutes 15       Nutrition:  Target Goals: Understanding of nutrition guidelines, daily intake of sodium <1556m, cholesterol <2052m calories 30% from fat and 7% or less from saturated fats, daily to have 5 or more servings of fruits and vegetables.  Biometrics:     Pre Biometrics - 08/28/15 1218    Pre Biometrics   Height 5' 1"  (1.549 m)   Weight 145 lb 9.6 oz (66.044 kg)   Waist Circumference 34 inches   Hip Circumference 41 inches   Waist to Hip Ratio 0.83 %   BMI (Calculated) 27.6       Nutrition Therapy Plan and Nutrition Goals:     Nutrition Therapy & Goals - 08/28/15 1115    Nutrition Therapy   Diet Prefers not to meet with the dietitian; She is participating in [employer] has lost 10lbs with 10lbs more to go.      Nutrition Discharge: Rate Your Plate Scores:   Psychosocial: Target Goals: Acknowledge presence or absence of depression, maximize coping skills, provide positive support system. Participant is able to verbalize types and ability to use techniques and skills needed for reducing stress and depression.  Initial Review & Psychosocial Screening:     Initial Psych Review & Screening - 08/28/15 1115    Family Dynamics   Good Support System? Yes   Comments Andrea [name] some support from  her husband, although he does have back problems and is unable to help with any work around the house. She does have some depression related to her shortness of breath and inability to do some activities she did before.                                    Barriers   Psychosocial barriers to participate in program The patient should benefit from  training in stress management and relaxation.   Screening Interventions   Interventions Encouraged to exercise;Program counselor consult      Quality of Life Scores:     Quality of Life - 08/28/15 1115    Quality of Life Scores   Health/Function Pre 20.87 %   Socioeconomic Pre 28.58 %   Psych/Spiritual Pre 27.43 %   Family Pre 27.6 %   GLOBAL Pre 24.68 %      PHQ-9:     Recent Review Flowsheet Data    Depression screen [facility] 2/9 08/28/2015 02/08/2015 07/31/2013 07/26/2013   Decreased Interest 0 0 0 0   Down, Depressed, Hopeless 1 0 0 0   PHQ - 2 Score 1 0 0 0   Altered sleeping 1 0 - -   Tired, decreased energy 2 0 - -   Change in appetite 1 0 - -   Feeling bad or failure about yourself  0 0 - -   Trouble concentrating 1 0 - -   Moving slowly or fidgety/restless 0 0 - -   Suicidal thoughts 0 0 - -   PHQ-9 Score 6 0 - -   Difficult doing work/chores Somewhat difficult Not difficult at all - -      Psychosocial Evaluation and Intervention:     Psychosocial Evaluation - 09/03/15 1213    Psychosocial Evaluation & Interventions   Interventions Relaxation education;Encouraged to exercise with the program and follow exercise prescription   Comments Counselor met with Andrea [name] today for initial psychosocial evaluation.  She is a 75 year old who has both Asthma and COPD, as well as a cardiac history of health issues.  She has a strong support system with a spouse of 32 years and an adult son who lives in the home.  Andrea. [name] is also actively involved in her local church.  She states that she sleeps well, has a good appetite and has a history of depression and anxiety that  is being treated with medication successfully.  She reports she is typically  in a positive mood and has very few stressors in her life currently.  Her goals for this program are to lose weight and to exercise consistently.  Counselor encouraged her to meet with the dietician to discuss her weight loss goals.         Psychosocial Re-Evaluation:  Education: Education Goals: Education classes will be provided on a weekly basis, covering required topics. Participant will state understanding/return demonstration of topics presented.  Learning Barriers/Preferences:     Learning Barriers/Preferences - 08/28/15 1115    Learning Barriers/Preferences   Learning Barriers None   Learning Preferences Group Instruction;Individual Instruction;Pictoral;Skilled Demonstration;Verbal Instruction;Video;Written Material      Education Topics: Initial Evaluation Education: - Verbal, written and demonstration of respiratory meds, RPE/PD scales, oximetry and breathing techniques. Instruction on use of nebulizers and MDIs: cleaning and proper use, rinsing mouth with steroid doses and importance of monitoring MDI activations.  Pulmonary Rehab from 09/03/2015 in [facility] Cardiac and Pulmonary Rehab   Date  08/28/15   Educator  LB   Instruction Review Code  2- meets goals/outcomes      General Nutrition Guidelines/Fats and Fiber: -Group instruction provided by verbal, written material, models and posters to present the general guidelines for heart healthy nutrition. Gives an explanation and review of dietary fats and fiber.   Controlling Sodium/Reading Food Labels: -Group verbal and written material supporting the discussion of sodium use in heart healthy nutrition. Review and explanation with models, verbal and written materials for utilization of the food label.   Exercise Physiology & Risk Factors: - Group verbal and written instruction with models to review the exercise physiology of the cardiovascular system and associated critical values. Details cardiovascular disease risk factors and the goals associated with each risk factor.   Aerobic Exercise & Resistance Training: - Gives group verbal and written discussion on the health impact of inactivity. On the components of aerobic and resistive training  programs and the benefits of this training and how to safely progress through these programs.   Flexibility, Balance, General Exercise Guidelines: - Provides group verbal and written instruction on the benefits of flexibility and balance training programs. Provides general exercise guidelines with specific guidelines to those with heart or lung disease. Demonstration and skill practice provided.   Stress Management: - Provides group verbal and written instruction about the health risks of elevated stress, cause of high stress, and healthy ways to reduce stress.   Depression: - Provides group verbal and written instruction on the correlation between heart/lung disease and depressed mood, treatment options, and the stigmas associated with seeking treatment.   Exercise & Equipment Safety: - Individual verbal instruction and demonstration of equipment use and safety with use of the equipment.      Pulmonary Rehab from 09/03/2015 in [facility] Cardiac and Pulmonary Rehab   Date  09/03/15   Educator  C. EnterkinRN   Instruction Review Code  2- meets goals/outcomes      Infection Prevention: - Provides verbal and written material to individual with discussion of infection control including proper hand washing and proper equipment cleaning during exercise session.   Falls Prevention: - Provides verbal and written material to individual with discussion of falls prevention and safety.      Pulmonary Rehab from 09/03/2015 in [facility] Cardiac and Pulmonary Rehab   Date  08/28/15   Educator  LB   Instruction Review Code  2- meets goals/outcomes      Diabetes: - Individual verbal and written instruction to review signs/symptoms of diabetes, desired ranges of glucose level fasting, after meals and with exercise. Advice that pre and post exercise glucose checks will be done for 3 sessions at entry of program.   Chronic Lung Diseases: - Group verbal and written instruction to review new updates, new  respiratory medications, new advancements in procedures and treatments. Provide informative websites and "800" numbers of self-education.   Lung Procedures: - Group verbal and written instruction to describe testing methods done to diagnose lung disease. Review the outcome of test results. Describe the treatment choices: Pulmonary Function Tests, ABGs and oximetry.   Energy Conservation: - Provide group verbal and written instruction for methods to conserve energy, plan and organize activities. Instruct on pacing techniques, use of adaptive equipment and posture/positioning to relieve shortness of breath.   Triggers: - Group verbal and written instruction to review types of environmental controls: home humidity, furnaces, filters, dust mite/pet prevention, HEPA  vacuums. To discuss weather changes, air quality and the benefits of nasal washing.   Exacerbations: - Group verbal and written instruction to provide: warning signs, infection symptoms, calling MD promptly, preventive modes, and value of vaccinations. Review: effective airway clearance, coughing and/or vibration techniques. Create an Sports administrator.   Oxygen: - Individual and group verbal and written instruction on oxygen therapy. Includes supplement oxygen, available portable oxygen systems, continuous and intermittent flow rates, oxygen safety, concentrators, and Medicare reimbursement for oxygen.   Respiratory Medications: - Group verbal and written instruction to review medications for lung disease. Drug class, frequency, complications, importance of spacers, rinsing mouth after steroid MDI's, and proper cleaning methods for nebulizers.      Pulmonary Rehab from 09/03/2015 in [facility] Cardiac and Pulmonary Rehab   Date  08/28/15   Educator  LB   Instruction Review Code  2- meets goals/outcomes      AED/CPR: - Group verbal and written instruction with the use of models to demonstrate the basic use of the AED with the basic ABC's  of resuscitation.   Breathing Retraining: - Provides individuals verbal and written instruction on purpose, frequency, and proper technique of diaphragmatic breathing and pursed-lipped breathing. Applies individual practice skills.      Pulmonary Rehab from 09/03/2015 in [facility] Cardiac and Pulmonary Rehab   Date  08/28/15   Educator  LB   Instruction Review Code  2- meets goals/outcomes      Anatomy and Physiology of the Lungs: - Group verbal and written instruction with the use of models to provide basic lung anatomy and physiology related to function, structure and complications of lung disease.   Heart Failure: - Group verbal and written instruction on the basics of heart failure: signs/symptoms, treatments, explanation of ejection fraction, enlarged heart and cardiomyopathy.   Sleep Apnea: - Individual verbal and written instruction to review Obstructive Sleep Apnea. Review of risk factors, methods for diagnosing and types of masks and machines for OSA.   Anxiety: - Provides group, verbal and written instruction on the correlation between heart/lung disease and anxiety, treatment options, and management of anxiety.   Relaxation: - Provides group, verbal and written instruction about the benefits of relaxation for patients with heart/lung disease. Also provides patients with examples of relaxation techniques.   Knowledge Questionnaire Score:     Knowledge Questionnaire Score - 08/28/15 1115    Knowledge Questionnaire Score   Pre Score 8/10       Core Components/Risk Factors/Patient Goals at Admission:     Personal Goals and Risk Factors at Admission - 08/28/15 1115    Core Components/Risk Factors/Patient Goals on Admission   Increase Strength and Stamina Yes  Andrea [name] does like to walk, but is limited by her shortness of breath.   Intervention Provide advice, education, support and counseling about physical activity/exercise needs.;Develop an individualized exercise  prescription for aerobic and resistive training based on initial evaluation findings, risk stratification, comorbidities and participant's personal goals.  Make one of her exercies goals the treadmill   Expected Outcomes Achievement of increased cardiorespiratory fitness and enhanced flexibility, muscular endurance and strength shown through measurements of functional capacity and personal statement of participant.   Improve shortness of breath with ADL's Yes   Intervention Provide education, individualized exercise plan and daily activity instruction to help decrease symptoms of SOB with activities of daily living.   Expected Outcomes Short Term: Achieves a reduction of symptoms when performing activities of daily living.   Develop more efficient breathing techniques  such as purse lipped breathing and diaphragmatic breathing; and practicing self-pacing with activity Yes   Intervention Provide education, demonstration and support about specific breathing techniuqes utilized for more efficient breathing. Include techniques such as pursed lipped breathing, diaphragmatic breathing and self-pacing activity.   Expected Outcomes Short Term: Participant will be able to demonstrate and use breathing techniques as needed throughout daily activities.   Increase knowledge of respiratory medications and ability to use respiratory devices properly  Yes  Andrea [name] uses Albuterol, Symbicort, and Spiriva; she has a spacer.   Intervention Provide education and demonstration as needed of appropriate use of medications, inhalers, and oxygen therapy.   Expected Outcomes Short Term: Achieves understanding of medications use. Understands that oxygen is a medication prescribed by physician. Demonstrates appropriate use of inhaler and oxygen therapy.   Hypertension Yes   Intervention Provide education on lifestyle modifcations including regular physical activity/exercise, weight management, moderate sodium restriction and  increased consumption of fresh fruit, vegetables, and low fat dairy, alcohol moderation, and smoking cessation.;Monitor prescription use compliance.   Expected Outcomes Short Term: Continued assessment and intervention until BP is < 140/88m HG in hypertensive participants. < 130/855mHG in hypertensive participants with diabetes, heart failure or chronic kidney disease.;Long Term: Maintenance of blood pressure at goal levels.      Core Components/Risk Factors/Patient Goals Review:    Core Components/Risk Factors/Patient Goals at Discharge (Final Review):    ITP Comments:   Comments: 30 day review

## 2015-09-12 ENCOUNTER — Ambulatory Visit: Payer: PPO

## 2015-09-14 ENCOUNTER — Ambulatory Visit: Payer: PPO

## 2015-09-17 ENCOUNTER — Ambulatory Visit: Payer: PPO

## 2015-09-19 ENCOUNTER — Ambulatory Visit: Payer: PPO

## 2015-09-21 ENCOUNTER — Ambulatory Visit: Payer: PPO

## 2015-09-24 ENCOUNTER — Ambulatory Visit: Payer: PPO

## 2015-09-24 ENCOUNTER — Ambulatory Visit: Payer: PPO | Admitting: Family Medicine

## 2015-09-25 ENCOUNTER — Encounter: Payer: Self-pay | Admitting: Cardiovascular Disease

## 2015-09-25 ENCOUNTER — Ambulatory Visit (INDEPENDENT_AMBULATORY_CARE_PROVIDER_SITE_OTHER): Payer: PPO | Admitting: Cardiovascular Disease

## 2015-09-25 VITALS — BP 122/72 | HR 55 | Ht 62.0 in | Wt 143.0 lb

## 2015-09-25 DIAGNOSIS — E785 Hyperlipidemia, unspecified: Secondary | ICD-10-CM | POA: Diagnosis not present

## 2015-09-25 DIAGNOSIS — I200 Unstable angina: Secondary | ICD-10-CM

## 2015-09-25 DIAGNOSIS — I209 Angina pectoris, unspecified: Secondary | ICD-10-CM

## 2015-09-25 DIAGNOSIS — I25701 Atherosclerosis of coronary artery bypass graft(s), unspecified, with angina pectoris with documented spasm: Secondary | ICD-10-CM | POA: Diagnosis not present

## 2015-09-25 DIAGNOSIS — I739 Peripheral vascular disease, unspecified: Secondary | ICD-10-CM

## 2015-09-25 DIAGNOSIS — R0602 Shortness of breath: Secondary | ICD-10-CM

## 2015-09-25 DIAGNOSIS — J449 Chronic obstructive pulmonary disease, unspecified: Secondary | ICD-10-CM

## 2015-09-25 HISTORY — DX: Unstable angina: I20.0

## 2015-09-25 MED ORDER — NITROGLYCERIN 0.4 MG SL SUBL: 0.4000 mg | SUBLINGUAL_TABLET | SUBLINGUAL | Status: DC | PRN

## 2015-09-25 NOTE — Assessment & Plan Note (Signed)
She reports having symptoms concerning for angina. As detailed above, she prefers to try nitroglycerin. Would schedule cardiac catheterization if symptoms get worse

## 2015-09-25 NOTE — Assessment & Plan Note (Signed)
Symptoms of chest pain concerning for angina Long discussion concerning her exertional related symptoms radiating up into her neck She had stress test 6 months ago showing no ischemia She does not want catheterization at this time, first to try nitroglycerin sublingual Recommended if symptoms become more frequent, last longer or get worse that she call our office for catheterization

## 2015-09-25 NOTE — Assessment & Plan Note (Signed)
She reports having stable respiratory status. Currently on several inhalers Followed by pulmonary   Total encounter time more than 25 minutes  Greater than 50% was spent in counseling and coordination of care with the patient

## 2015-09-25 NOTE — Progress Notes (Signed)
[name], female    DOB: Oct 16, 1940, 75 y.o.   MRN: [id number]  HPI Comments: Andrea [name] is a pleasant 75 year old woman with history of chronic bradycardia,  spinal stenosis, peripheral vascular disease, status post CEA on the left ,  40-59% residual disease on the right,   hyperlipidemia, significantly improved on Crestor, coronary artery disease and bypass surgery in 1999, stent placement to distal RCA may 2013, who presents for routine followup  Of her coronary artery disease . 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 Also has chronic mild shortness of breath She is not been taking nitroglycerin but will try. Symptoms have not been getting worse, do not steam to last for a long, resolve when she rests. No regular exercise program.  EKG on today's visit shows sinus bradycardia with rate 52 bpm, APC, diffuse T-wave abnormality, unchanged from previous EKG Lab work reviewed with her in detail showing hemoglobin A1c 6.1, total cholesterol 144, LDL 83  Other past medical history Old stress test reviewed with her from 2013 at which time she had normal perfusion  symptoms at that time felt secondary to COPD  Has not had follow-up with pulmonary since 2014   tolerating her Crestor 40 mg daily. On this her cholesterol has significantly improved now down to within a excellent range. Chronic back pain     Allergies  Allergen Reactions  . Ciprofloxacin     REACTION: rash IV  . Codeine     REACTION: nausea/vomiting  . Erythromycin     REACTION: tongue burns  . Meperidine Hcl     REACTION: Nausea/vomiting  . Penicillins     REACTION: rash  . Shellfish Allergy Nausea And Vomiting  . Tape Rash    Outpatient Encounter  Prescriptions as of 09/25/2015  Medication Sig  . acetaminophen (TYLENOL) 650 MG CR tablet Take 1,300 mg by mouth 2 (two) times daily.  Marland Kitchen albuterol (PROVENTIL HFA;VENTOLIN HFA) 108 (90 Base) MCG/ACT inhaler Inhale 2 puffs into the lungs every 6 (six) hours as needed for wheezing.  Marland Kitchen aspirin 81 MG tablet Take 81 mg by mouth daily.  . budesonide-formoterol (SYMBICORT) 160-4.5 MCG/ACT inhaler Inhale 2 puffs into the lungs 2 (two) times daily.  . Calcium Carbonate-Vitamin D (CALCIUM + D PO) Take 1 tablet by mouth 2 (two) times daily.  . clopidogrel (PLAVIX) 75 MG tablet TAKE 1 TABLET (75 MG TOTAL) BY MOUTH DAILY.  Marland Kitchen diclofenac sodium (VOLTAREN) 1 % GEL Apply 2 g topically 3 (three) times daily as needed (pain).  . fluticasone (FLONASE) 50 MCG/ACT nasal spray Place 2 sprays into the nose 2 (two) times daily as needed. For congestion  . hydrochlorothiazide (HYDRODIURIL) 25 MG tablet Take 1 tablet (25 mg total) by mouth every morning.  Marland Kitchen HYDROcodone-acetaminophen (NORCO/VICODIN) 5-325 MG per tablet Take 1 tablet by mouth 2 (two) times daily as needed for pain.  . isosorbide mononitrate (IMDUR) 30 MG 24 hr tablet TAKE 1 TABLET (30 MG TOTAL) BY MOUTH DAILY.  Marland Kitchen Krill Oil 1000 MG CAPS Take 1,000 mg by mouth daily.  Marland Kitchen losartan (COZAAR) 100 MG tablet TAKE 1 TABLET (100 MG TOTAL) BY MOUTH DAILY.  . montelukast (SINGULAIR) 10 MG tablet TAKE 1 TABLET BY MOUTH AT  BEDTIME  . mupirocin ointment (BACTROBAN) 2 % Place 1 application into the nose daily. Via qtip to ear daily  . nitroGLYCERIN (NITROSTAT) 0.4 MG SL tablet Place 1 tablet (0.4 mg total) under the tongue every 5 (five) minutes as needed for chest pain.  . pantoprazole (PROTONIX) 40 MG tablet TAKE 1 TABLET (40 MG TOTAL) BY MOUTH DAILY.  Vladimir Faster Glycol-Propyl Glycol (SYSTANE OP) Place 1 drop into both eyes daily as needed (dry eyes).  . potassium chloride (K-DUR) 10 MEQ tablet Take 1 tablet (10 mEq total) by mouth daily as needed.  . rosuvastatin  (CRESTOR) 40 MG tablet Take 1 tablet (40 mg total) by mouth daily.  . sertraline (ZOLOFT) 50 MG tablet TAKE 1 TABLET EVERY DAY **NEED OFFICE VISIT  . Spacer/Aero-Holding Chambers DEVI 1 spacer to use prn with HFA device as directed  . tiotropium (SPIRIVA) 18 MCG inhalation capsule Place 1 capsule (18 mcg total) into inhaler and inhale daily.  Marland Kitchen triamcinolone ointment (KENALOG) 0.5 % Apply 1 application topically 2 (two) times daily.  . [DISCONTINUED] nitroGLYCERIN (NITROSTAT) 0.4 MG SL tablet Place 1 tablet (0.4 mg total) under the tongue every 5 (five) minutes as needed for chest pain.  . [DISCONTINUED] SYMBICORT 160-4.5 MCG/ACT inhaler INHALE 2 PUFFS INTO THE LUNGS 2 (TWO) TIMES DAILY. (Patient not taking: Reported on 09/25/2015)   No facility-administered encounter medications on file as of 09/25/2015.    Past Medical History  Diagnosis Date  . COPD (chronic obstructive pulmonary disease) ([place])   . Cerebrovascular disease 01/2009    carotid u/s  R 0-39%   L 60-79%  . CAD (coronary artery disease)     s/CABG (reports IMA and 2 SVGs) back in 1999  Myoview normal 3/10; s/p PCI with DES to PL branch of distal RCA 09/2011  . Depression   . GERD (gastroesophageal reflux disease)   . Hypertension   . Hyperlipidemia   . Osteoporosis   . PVD (peripheral vascular disease) ([place])   . Dizziness   . AAA (abdominal aortic aneurysm) ([place]) 01/2009    AAA (2.8 x 3.0)  moderate RAS (left); 2.7 x 2.7 cm (07/11/05)  . NSTEMI (non-ST elevated myocardial infarction) ([place]) 11/12    Cath showed atretic IMA graft to the LAD, SVG to PD was patent but the continuation of this graft to the PL branch was occluded; there were L to R collaterals; Mid LAD had a 60 to 70% stenosis. She has been treated medically.  Neg Myoview 05/2011  . Bradycardia     Metoprolol stopped 08/2011  . Angina   . Dysrhythmia     hx of sinus brady  . Shortness of breath   . Blood transfusion     hx of transfusion   . Herniated lumbar disc  without myelopathy   . Anosmia   . Acute bronchitis 03/20/2013  . Medicare annual wellness visit, subsequent 07/31/2013  . Thoracic back pain 01/24/2014  . Baker's cyst of knee 01/30/2014  . Asthma     Past Surgical History  Procedure Laterality Date  . Coronary artery bypass graft  1999  . Tubal ligation    . Abdominal aortic aneurysm repair    . Femoral artery stent    . Closed reduction nasal fracture  11/2007  . Carotid endarterectomy  01/02/11    left  . Coronary angioplasty with stent placement  10/10/2011    drug eluting  to rc & saphenous  . Nasal sinus surgery  twice  . Left heart catheterization with coronary/graft angiogram N/A 04/22/2011    Procedure: LEFT HEART CATHETERIZATION WITH Beatrix Fetters;  Surgeon: Jolaine Artist, MD;  Location: [facility] CATH LAB;  Service: Cardiovascular;  Laterality: N/A;  . Left heart catheterization with coronary/graft angiogram N/A 10/10/2011    Procedure: LEFT HEART CATHETERIZATION WITH Beatrix Fetters;  Surgeon: Peter M Martinique, MD;  Location: [facility] CATH LAB;  Service: Cardiovascular;  Laterality: N/A;    Social History  reports that she quit smoking about 22 years ago. Her smoking use included Cigarettes. She has a 20 pack-year smoking history. She has never used smokeless tobacco. She reports that she does not drink alcohol or use illicit drugs.  Family History family history includes Alcohol abuse in her brother; Aneurysm in her brother; Cancer in her father and sister; Cirrhosis in her maternal grandfather; Colon cancer (age of onset: 54) in her father; Diabetes in her maternal grandmother and mother; Heart attack in her brother and brother; Heart attack (age of onset: 63) in her mother; Heart disease in her brother, brother, and father; Hyperlipidemia in her daughter; Hypertension in her brother and father; Hypothyroidism in her sister; Lung cancer in her father and sister; Obesity in her daughter and son; Stroke in her maternal  grandmother and paternal grandmother.        Review of Systems  Constitutional: Negative.   Respiratory: Positive for chest tightness and shortness of breath.   Cardiovascular: Negative.   Gastrointestinal: Negative.   Musculoskeletal: Positive for back pain.  Skin: Negative.   Neurological: Negative.   Hematological: Negative.   Psychiatric/Behavioral: Negative.   All other systems reviewed and are negative.   BP 122/72 mmHg  Pulse 55  Ht '5\' 2"'$  (1.575 m)  Wt 143 lb (64.864 kg)  BMI 26.15 kg/m2  Physical Exam  Constitutional: She is oriented to person, place, and time. She appears well-developed and well-nourished.  HENT:  Head: Normocephalic.  Nose: Nose normal.  Mouth/Throat: Oropharynx is clear and moist.  Eyes: Conjunctivae are normal. Pupils are equal, round, and reactive to light.  Neck: Normal range of motion. Neck supple. No JVD present.  Cardiovascular: Normal rate, regular rhythm, S1 normal, S2 normal and intact distal pulses.  Exam reveals no gallop and no friction rub.   Murmur heard.  Systolic murmur is present with a grade of 2/6  Pulmonary/Chest: Effort normal. No respiratory distress. She has decreased breath sounds. She has no wheezes. She has no rales. She exhibits no tenderness.  Abdominal: Soft. Bowel sounds are normal. She exhibits no distension. There is no tenderness.  Musculoskeletal: Normal range of motion. She exhibits no edema or tenderness.  Lymphadenopathy:    She has no cervical adenopathy.  Neurological: She is alert and oriented to person, place, and time. Coordination normal.  Skin: Skin is warm and dry. No rash noted. No erythema.  Psychiatric: She has a normal mood and affect. Her behavior is normal. Judgment and thought content normal.    Assessment and Plan  Nursing note and vitals reviewed.

## 2015-09-25 NOTE — Assessment & Plan Note (Signed)
Moderate bilateral carotid disease, previous stents to the lower extremities.

## 2015-09-25 NOTE — Patient Instructions (Signed)
You are doing well. No medication changes were made.  WE CAN USE ZETIA ONE A DAY WITH CRESTOR IF NEEDED (ADDITIONAL 10% DROP)  Please call us if you have new issues that need to be addressed before your next appt.  Your physician wants you to follow-up in: 6 months.  You will receive a reminder letter in the mail two months in advance. If you don't receive a letter, please call our office to schedule the follow-up appointment.

## 2015-09-25 NOTE — Assessment & Plan Note (Signed)
Tolerating Crestor 40 mg daily, LDL above goal We did discuss possibly starting zetia. She would like to wait and see how her next set of numbers go before adding any new medication

## 2015-09-26 ENCOUNTER — Ambulatory Visit: Payer: PPO

## 2015-09-28 ENCOUNTER — Ambulatory Visit: Payer: PPO

## 2015-10-01 ENCOUNTER — Ambulatory Visit: Payer: PPO

## 2015-10-03 ENCOUNTER — Ambulatory Visit: Payer: PPO

## 2015-10-05 ENCOUNTER — Ambulatory Visit: Payer: PPO

## 2015-10-08 ENCOUNTER — Ambulatory Visit: Payer: PPO

## 2015-10-09 ENCOUNTER — Encounter: Payer: Self-pay | Admitting: Respiratory Therapy

## 2015-10-09 DIAGNOSIS — J449 Chronic obstructive pulmonary disease, unspecified: Secondary | ICD-10-CM

## 2015-10-09 NOTE — Progress Notes (Signed)
Pulmonary Individual Treatment Plan  Patient Details  Name: Andrea [name] MRN: [id number] Date of Birth: 05-17-1941 Referring Provider:  Dr Merton Border  Initial Encounter Date:       Pulmonary Rehab from 08/28/2015 in [facility] Cardiac and Pulmonary Rehab   Date  08/28/15      Visit Diagnosis: COPD, moderate ([place])  Patient's Home Medications on Admission:  Current outpatient prescriptions:    acetaminophen (TYLENOL) 650 MG CR tablet, Take 1,300 mg by mouth 2 (two) times daily., Disp: , Rfl:    albuterol (PROVENTIL HFA;VENTOLIN HFA) 108 (90 Base) MCG/ACT inhaler, Inhale 2 puffs into the lungs every 6 (six) hours as needed for wheezing., Disp: 1 Inhaler, Rfl: 11   aspirin 81 MG tablet, Take 81 mg by mouth daily., Disp: , Rfl:    budesonide-formoterol (SYMBICORT) 160-4.5 MCG/ACT inhaler, Inhale 2 puffs into the lungs 2 (two) times daily., Disp: 1 Inhaler, Rfl: 0   Calcium Carbonate-Vitamin D (CALCIUM + D PO), Take 1 tablet by mouth 2 (two) times daily., Disp: , Rfl:    clopidogrel (PLAVIX) 75 MG tablet, TAKE 1 TABLET (75 MG TOTAL) BY MOUTH DAILY., Disp: 90 tablet, Rfl: 3   diclofenac sodium (VOLTAREN) 1 % GEL, Apply 2 g topically 3 (three) times daily as needed (pain)., Disp: , Rfl:    fluticasone (FLONASE) 50 MCG/ACT nasal spray, Place 2 sprays into the nose 2 (two) times daily as needed. For congestion, Disp: , Rfl:    hydrochlorothiazide (HYDRODIURIL) 25 MG tablet, Take 1 tablet (25 mg total) by mouth every morning., Disp: 90 tablet, Rfl: 3   HYDROcodone-acetaminophen (NORCO/VICODIN) 5-325 MG per tablet, Take 1 tablet by mouth 2 (two) times daily as needed for pain., Disp: 30 tablet, Rfl: 1   isosorbide mononitrate (IMDUR) 30 MG 24 hr tablet, TAKE 1 TABLET (30 MG TOTAL) BY MOUTH DAILY., Disp: 90 tablet, Rfl: 3   Krill Oil 1000 MG CAPS, Take 1,000 mg by mouth daily., Disp: , Rfl:    losartan (COZAAR) 100 MG tablet, TAKE 1 TABLET (100 MG TOTAL) BY MOUTH DAILY., Disp: 90 tablet,  Rfl: 3   montelukast (SINGULAIR) 10 MG tablet, TAKE 1 TABLET BY MOUTH AT BEDTIME, Disp: 30 tablet, Rfl: 2   mupirocin ointment (BACTROBAN) 2 %, Place 1 application into the nose daily. Via qtip to ear daily, Disp: 22 g, Rfl: 0   nitroGLYCERIN (NITROSTAT) 0.4 MG SL tablet, Place 1 tablet (0.4 mg total) under the tongue every 5 (five) minutes as needed for chest pain., Disp: 25 tablet, Rfl: 6   pantoprazole (PROTONIX) 40 MG tablet, TAKE 1 TABLET (40 MG TOTAL) BY MOUTH DAILY., Disp: 90 tablet, Rfl: 3   Polyethyl Glycol-Propyl Glycol (SYSTANE OP), Place 1 drop into both eyes daily as needed (dry eyes)., Disp: , Rfl:    potassium chloride (K-DUR) 10 MEQ tablet, Take 1 tablet (10 mEq total) by mouth daily as needed., Disp: 90 tablet, Rfl: 3   rosuvastatin (CRESTOR) 40 MG tablet, Take 1 tablet (40 mg total) by mouth daily., Disp: 90 tablet, Rfl: 3   sertraline (ZOLOFT) 50 MG tablet, TAKE 1 TABLET EVERY DAY **NEED OFFICE VISIT, Disp: 90 tablet, Rfl: 1   Spacer/Aero-Holding Chambers DEVI, 1 spacer to use prn with HFA device as directed, Disp: 1 each, Rfl: 0   tiotropium (SPIRIVA) 18 MCG inhalation capsule, Place 1 capsule (18 mcg total) into inhaler and inhale daily., Disp: 30 capsule, Rfl: 11   triamcinolone ointment (KENALOG) 0.5 %, Apply 1 application topically 2 (two)  times daily., Disp: 30 g, Rfl: 2  Past Medical History: Past Medical History  Diagnosis Date   COPD (chronic obstructive pulmonary disease) ([place])    Cerebrovascular disease 01/2009    carotid u/s  R 0-39%   L 60-79%   CAD (coronary artery disease)     s/CABG (reports IMA and 2 SVGs) back in 1999  Myoview normal 3/10; s/p PCI with DES to PL branch of distal RCA 09/2011   Depression    GERD (gastroesophageal reflux disease)    Hypertension    Hyperlipidemia    Osteoporosis    PVD (peripheral vascular disease) ([facility])    Dizziness    AAA (abdominal aortic aneurysm) ([place]) 01/2009    AAA (2.8 x 3.0)  moderate RAS  (left); 2.7 x 2.7 cm (07/11/05)   NSTEMI (non-ST elevated myocardial infarction) ([place]) 11/12    Cath showed atretic IMA graft to the LAD, SVG to PD was patent but the continuation of this graft to the PL branch was occluded; there were L to R collaterals; Mid LAD had a 60 to 70% stenosis. She has been treated medically.  Neg Myoview 05/2011   Bradycardia     Metoprolol stopped 08/2011   Angina    Dysrhythmia     hx of sinus brady   Shortness of breath    Blood transfusion     hx of transfusion    Herniated lumbar disc without myelopathy    Anosmia    Acute bronchitis 03/20/2013   Medicare annual wellness visit, subsequent 07/31/2013   Thoracic back pain 01/24/2014   Baker's cyst of knee 01/30/2014   Asthma     Tobacco Use: History  Smoking status   Former Smoker -- 0.50 packs/day for 40 years   Types: Cigarettes   Quit date: 01/19/1993  Smokeless tobacco   Never Used    Labs: Recent Review Flowsheet Data    Labs for ITP Cardiac and Pulmonary Rehab Latest Ref Rng 06/07/2012 03/17/2013 07/26/2013 01/24/2014 02/08/2015   Cholestrol 0 - 200 mg/dL 122 306(H) 135 136 144   LDLCALC 0 - 99 mg/dL 42 199(H) 61 75 -   LDLDIRECT - - - - - 83.0   HDL >39.00 mg/dL 30(L) 34(L) 31(L) 34.50(L) 30.90(L)   Trlycerides 0.0 - 149.0 mg/dL 250(H) 364(H) 214(H) 134.0 330.0(H)   Hemoglobin A1c 4.6 - 6.5 % - - - - 6.1       ADL UCSD:     Pulmonary Assessment Scores      08/28/15 1115       ADL UCSD   ADL Phase Entry     SOB Score total 17     Rest 1     Walk 1     Stairs 2     Bath 0     Dress 0     Shop 1        Pulmonary Function Assessment:     Pulmonary Function Assessment - 08/28/15 1115    Pulmonary Function Tests   RV% 69 %   DLCO% 79 %   Initial Spirometry Results   FVC% 87 %   FEV1% 65 %   FEV1/FVC Ratio 56   Comments Performed 07/25/2015   Post Bronchodilator Spirometry Results   FVC% 92 %   FEV1% 69 %   FEV1/FVC Ratio 57   Breath   Bilateral Breath  Sounds Decreased;Clear   Shortness of Breath Yes;Limiting activity      Exercise Target Goals:  Exercise Program Goal: Individual exercise prescription set with THRR, safety & activity barriers. Participant demonstrates ability to understand and report RPE using BORG scale, to self-measure pulse accurately, and to acknowledge the importance of the exercise prescription.  Exercise Prescription Goal: Starting with aerobic activity 30 plus minutes a day, 3 days per week for initial exercise prescription. Provide home exercise prescription and guidelines that participant acknowledges understanding prior to discharge.  Activity Barriers & Risk Stratification:     Activity Barriers & Cardiac Risk Stratification - 08/28/15 1115    Activity Barriers & Cardiac Risk Stratification   Activity Barriers Back Problems;Shortness of Breath;Deconditioning   Cardiac Risk Stratification Moderate      6 Minute Walk:     6 Minute Walk      08/28/15 1216       6 Minute Walk   Phase Initial     Distance 1500 feet     Walk Time 6 minutes     # of Rest Breaks 0     MPH 2.8     RPE 13     Perceived Dyspnea  3     Resting HR 56 bpm     Resting BP 100/60 mmHg     Max Ex. HR 81 bpm     Max Ex. BP 140/68 mmHg        Initial Exercise Prescription:     Initial Exercise Prescription - 08/28/15 1200    Date of Initial Exercise RX and Referring Provider   Date 08/28/15   Recumbant Bike   Level 2   RPM 40   Watts 20   Minutes 15   NuStep   Level 2   Watts 40   Minutes 15   Recumbant Elliptical   Level 2   RPM 40   Watts 20   Minutes 15   REL-XR   Level 2   Watts 50   Minutes 15   Biostep-RELP   Level 1   Watts 15   Minutes 15   Prescription Details   Frequency (times per week) 2   Duration Progress to 45 minutes of aerobic exercise without signs/symptoms of physical distress   Intensity   THRR REST +  30   Ratings of Perceived Exertion 11-15   Perceived Dyspnea 0-4    Progression   Progression Continue to progress workloads to maintain intensity without signs/symptoms of physical distress.   Resistance Training   Training Prescription Yes   Weight 2   Reps 10-15      Perform Capillary Blood Glucose checks as needed.  Exercise Prescription Changes:     Exercise Prescription Changes      09/03/15 1221           Response to Exercise   Blood Pressure (Admit) 122/62 mmHg       Blood Pressure (Exercise) 132/60 mmHg       Blood Pressure (Exit) 98/64 mmHg       Heart Rate (Admit) 62 bpm       Heart Rate (Exercise) 100 bpm       Heart Rate (Exit) 56 bpm       Oxygen Saturation (Admit) 95 %  Room air       Oxygen Saturation (Exercise) 91 %  room air       Oxygen Saturation (Exit) 98 %  Room air       Rating of Perceived Exertion (Exercise) 11       Perceived Dyspnea (Exercise) 2  Symptoms --  None       Duration Progress to 30 minutes of continuous aerobic without signs/symptoms of physical distress       Intensity Rest + 30       Resistance Training   Weight 2       Reps 10-15       Treadmill   MPH 2       Grade 1       Minutes 15       NuStep   Level 2       Watts 40       Minutes 15       REL-XR   Level 2       Watts 50       Minutes 15          Exercise Comments:   Discharge Exercise Prescription (Final Exercise Prescription Changes):     Exercise Prescription Changes - 09/03/15 1221    Response to Exercise   Blood Pressure (Admit) 122/62 mmHg   Blood Pressure (Exercise) 132/60 mmHg   Blood Pressure (Exit) 98/64 mmHg   Heart Rate (Admit) 62 bpm   Heart Rate (Exercise) 100 bpm   Heart Rate (Exit) 56 bpm   Oxygen Saturation (Admit) 95 %  Room air   Oxygen Saturation (Exercise) 91 %  room air   Oxygen Saturation (Exit) 98 %  Room air   Rating of Perceived Exertion (Exercise) 11   Perceived Dyspnea (Exercise) 2   Symptoms --  None   Duration Progress to 30 minutes of continuous aerobic without  signs/symptoms of physical distress   Intensity Rest + 30   Resistance Training   Weight 2   Reps 10-15   Treadmill   MPH 2   Grade 1   Minutes 15   NuStep   Level 2   Watts 40   Minutes 15   REL-XR   Level 2   Watts 50   Minutes 15       Nutrition:  Target Goals: Understanding of nutrition guidelines, daily intake of sodium <157m, cholesterol <2082m calories 30% from fat and 7% or less from saturated fats, daily to have 5 or more servings of fruits and vegetables.  Biometrics:     Pre Biometrics - 08/28/15 1218    Pre Biometrics   Height 5' 1" (1.549 m)   Weight 145 lb 9.6 oz (66.044 kg)   Waist Circumference 34 inches   Hip Circumference 41 inches   Waist to Hip Ratio 0.83 %   BMI (Calculated) 27.6       Nutrition Therapy Plan and Nutrition Goals:     Nutrition Therapy & Goals - 08/28/15 1115    Nutrition Therapy   Diet Prefers not to meet with the dietitian; She is participating in [employer] has lost 10lbs with 10lbs more to go.      Nutrition Discharge: Rate Your Plate Scores:   Psychosocial: Target Goals: Acknowledge presence or absence of depression, maximize coping skills, provide positive support system. Participant is able to verbalize types and ability to use techniques and skills needed for reducing stress and depression.  Initial Review & Psychosocial Screening:     Initial Psych Review & Screening - 08/28/15 1115    Family Dynamics   Good Support System? Yes   Comments Ms KeBreseeas some support from her husband, although he does have back problems and is unable to help with any work around the house. She  does have some depression related to her shortness of breath and inability to do some activities she did before.                                    Barriers   Psychosocial barriers to participate in program The patient should benefit from training in stress management and relaxation.   Screening Interventions   Interventions  Encouraged to exercise;Program counselor consult      Quality of Life Scores:     Quality of Life - 08/28/15 1115    Quality of Life Scores   Health/Function Pre 20.87 %   Socioeconomic Pre 28.58 %   Psych/Spiritual Pre 27.43 %   Family Pre 27.6 %   GLOBAL Pre 24.68 %      PHQ-9:     Recent Review Flowsheet Data    Depression screen [facility] 2/9 08/28/2015 02/08/2015 07/31/2013 07/26/2013   Decreased Interest 0 0 0 0   Down, Depressed, Hopeless 1 0 0 0   PHQ - 2 Score 1 0 0 0   Altered sleeping 1 0 - -   Tired, decreased energy 2 0 - -   Change in appetite 1 0 - -   Feeling bad or failure about yourself  0 0 - -   Trouble concentrating 1 0 - -   Moving slowly or fidgety/restless 0 0 - -   Suicidal thoughts 0 0 - -   PHQ-9 Score 6 0 - -   Difficult doing work/chores Somewhat difficult Not difficult at all - -      Psychosocial Evaluation and Intervention:     Psychosocial Evaluation - 09/03/15 1213    Psychosocial Evaluation & Interventions   Interventions Relaxation education;Encouraged to exercise with the program and follow exercise prescription   Comments Counselor met with Ms. Theissen today for initial psychosocial evaluation.  She is a 75 year old who has both Asthma and COPD, as well as a cardiac history of health issues.  She has a strong support system with a spouse of 54 years and an adult son who lives in the home.  Ms. Wisby is also actively involved in her local church.  She states that she sleeps well, has a good appetite and has a history of depression and anxiety that  is being treated with medication successfully.  She reports she is typically  in a positive mood and has very few stressors in her life currently.  Her goals for this program are to lose weight and to exercise consistently.  Counselor encouraged her to meet with the dietician to discuss her weight loss goals.        Psychosocial Re-Evaluation:  Education: Education Goals: Education classes will be  provided on a weekly basis, covering required topics. Participant will state understanding/return demonstration of topics presented.  Learning Barriers/Preferences:     Learning Barriers/Preferences - 08/28/15 1115    Learning Barriers/Preferences   Learning Barriers None   Learning Preferences Group Instruction;Individual Instruction;Pictoral;Skilled Demonstration;Verbal Instruction;Video;Written Material      Education Topics: Initial Evaluation Education: - Verbal, written and demonstration of respiratory meds, RPE/PD scales, oximetry and breathing techniques. Instruction on use of nebulizers and MDIs: cleaning and proper use, rinsing mouth with steroid doses and importance of monitoring MDI activations.          Pulmonary Rehab from 09/03/2015 in [facility] Cardiac and Pulmonary Rehab   Date  08/28/15  Educator  LB   Instruction Review Code  2- meets goals/outcomes      General Nutrition Guidelines/Fats and Fiber: -Group instruction provided by verbal, written material, models and posters to present the general guidelines for heart healthy nutrition. Gives an explanation and review of dietary fats and fiber.   Controlling Sodium/Reading Food Labels: -Group verbal and written material supporting the discussion of sodium use in heart healthy nutrition. Review and explanation with models, verbal and written materials for utilization of the food label.   Exercise Physiology & Risk Factors: - Group verbal and written instruction with models to review the exercise physiology of the cardiovascular system and associated critical values. Details cardiovascular disease risk factors and the goals associated with each risk factor.   Aerobic Exercise & Resistance Training: - Gives group verbal and written discussion on the health impact of inactivity. On the components of aerobic and resistive training programs and the benefits of this training and how to safely progress through these  programs.   Flexibility, Balance, General Exercise Guidelines: - Provides group verbal and written instruction on the benefits of flexibility and balance training programs. Provides general exercise guidelines with specific guidelines to those with heart or lung disease. Demonstration and skill practice provided.   Stress Management: - Provides group verbal and written instruction about the health risks of elevated stress, cause of high stress, and healthy ways to reduce stress.   Depression: - Provides group verbal and written instruction on the correlation between heart/lung disease and depressed mood, treatment options, and the stigmas associated with seeking treatment.   Exercise & Equipment Safety: - Individual verbal instruction and demonstration of equipment use and safety with use of the equipment.      Pulmonary Rehab from 09/03/2015 in [facility] Cardiac and Pulmonary Rehab   Date  09/03/15   Educator  C. EnterkinRN   Instruction Review Code  2- meets goals/outcomes      Infection Prevention: - Provides verbal and written material to individual with discussion of infection control including proper hand washing and proper equipment cleaning during exercise session.   Falls Prevention: - Provides verbal and written material to individual with discussion of falls prevention and safety.      Pulmonary Rehab from 09/03/2015 in [facility] Cardiac and Pulmonary Rehab   Date  08/28/15   Educator  LB   Instruction Review Code  2- meets goals/outcomes      Diabetes: - Individual verbal and written instruction to review signs/symptoms of diabetes, desired ranges of glucose level fasting, after meals and with exercise. Advice that pre and post exercise glucose checks will be done for 3 sessions at entry of program.   Chronic Lung Diseases: - Group verbal and written instruction to review new updates, new respiratory medications, new advancements in procedures and treatments. Provide  informative websites and "800" numbers of self-education.   Lung Procedures: - Group verbal and written instruction to describe testing methods done to diagnose lung disease. Review the outcome of test results. Describe the treatment choices: Pulmonary Function Tests, ABGs and oximetry.   Energy Conservation: - Provide group verbal and written instruction for methods to conserve energy, plan and organize activities. Instruct on pacing techniques, use of adaptive equipment and posture/positioning to relieve shortness of breath.   Triggers: - Group verbal and written instruction to review types of environmental controls: home humidity, furnaces, filters, dust mite/pet prevention, HEPA vacuums. To discuss weather changes, air quality and the benefits of nasal washing.   Exacerbations: -  Group verbal and written instruction to provide: warning signs, infection symptoms, calling MD promptly, preventive modes, and value of vaccinations. Review: effective airway clearance, coughing and/or vibration techniques. Create an Sports administrator.   Oxygen: - Individual and group verbal and written instruction on oxygen therapy. Includes supplement oxygen, available portable oxygen systems, continuous and intermittent flow rates, oxygen safety, concentrators, and Medicare reimbursement for oxygen.   Respiratory Medications: - Group verbal and written instruction to review medications for lung disease. Drug class, frequency, complications, importance of spacers, rinsing mouth after steroid MDI's, and proper cleaning methods for nebulizers.      Pulmonary Rehab from 09/03/2015 in [facility] Cardiac and Pulmonary Rehab   Date  08/28/15   Educator  LB   Instruction Review Code  2- meets goals/outcomes      AED/CPR: - Group verbal and written instruction with the use of models to demonstrate the basic use of the AED with the basic ABC's of resuscitation.   Breathing Retraining: - Provides individuals verbal and  written instruction on purpose, frequency, and proper technique of diaphragmatic breathing and pursed-lipped breathing. Applies individual practice skills.      Pulmonary Rehab from 09/03/2015 in [facility] Cardiac and Pulmonary Rehab   Date  08/28/15   Educator  LB   Instruction Review Code  2- meets goals/outcomes      Anatomy and Physiology of the Lungs: - Group verbal and written instruction with the use of models to provide basic lung anatomy and physiology related to function, structure and complications of lung disease.   Heart Failure: - Group verbal and written instruction on the basics of heart failure: signs/symptoms, treatments, explanation of ejection fraction, enlarged heart and cardiomyopathy.   Sleep Apnea: - Individual verbal and written instruction to review Obstructive Sleep Apnea. Review of risk factors, methods for diagnosing and types of masks and machines for OSA.   Anxiety: - Provides group, verbal and written instruction on the correlation between heart/lung disease and anxiety, treatment options, and management of anxiety.   Relaxation: - Provides group, verbal and written instruction about the benefits of relaxation for patients with heart/lung disease. Also provides patients with examples of relaxation techniques.   Knowledge Questionnaire Score:     Knowledge Questionnaire Score - 08/28/15 1115    Knowledge Questionnaire Score   Pre Score 8/10       Core Components/Risk Factors/Patient Goals at Admission:     Personal Goals and Risk Factors at Admission - 08/28/15 1115    Core Components/Risk Factors/Patient Goals on Admission   Increase Strength and Stamina Yes  Ms Masullo does like to walk, but is limited by her shortness of breath.   Intervention Provide advice, education, support and counseling about physical activity/exercise needs.;Develop an individualized exercise prescription for aerobic and resistive training based on initial evaluation  findings, risk stratification, comorbidities and participant's personal goals.  Make one of her exercies goals the treadmill   Expected Outcomes Achievement of increased cardiorespiratory fitness and enhanced flexibility, muscular endurance and strength shown through measurements of functional capacity and personal statement of participant.   Improve shortness of breath with ADL's Yes   Intervention Provide education, individualized exercise plan and daily activity instruction to help decrease symptoms of SOB with activities of daily living.   Expected Outcomes Short Term: Achieves a reduction of symptoms when performing activities of daily living.   Develop more efficient breathing techniques such as purse lipped breathing and diaphragmatic breathing; and practicing self-pacing with activity Yes   Intervention  Provide education, demonstration and support about specific breathing techniuqes utilized for more efficient breathing. Include techniques such as pursed lipped breathing, diaphragmatic breathing and self-pacing activity.   Expected Outcomes Short Term: Participant will be able to demonstrate and use breathing techniques as needed throughout daily activities.   Increase knowledge of respiratory medications and ability to use respiratory devices properly  Yes  Ms Schader uses Albuterol, Symbicort, and Spiriva; she has a spacer.   Intervention Provide education and demonstration as needed of appropriate use of medications, inhalers, and oxygen therapy.   Expected Outcomes Short Term: Achieves understanding of medications use. Understands that oxygen is a medication prescribed by physician. Demonstrates appropriate use of inhaler and oxygen therapy.   Hypertension Yes   Intervention Provide education on lifestyle modifcations including regular physical activity/exercise, weight management, moderate sodium restriction and increased consumption of fresh fruit, vegetables, and low fat dairy, alcohol  moderation, and smoking cessation.;Monitor prescription use compliance.   Expected Outcomes Short Term: Continued assessment and intervention until BP is < 140/82m HG in hypertensive participants. < 130/835mHG in hypertensive participants with diabetes, heart failure or chronic kidney disease.;Long Term: Maintenance of blood pressure at goal levels.      Core Components/Risk Factors/Patient Goals Review:    Core Components/Risk Factors/Patient Goals at Discharge (Final Review):    ITP Comments:     ITP Comments      10/09/15 0841           ITP Comments Ms KeSlappeyas been called several times with no response. Her last date of attendence was 09/03/15 - 2nd session of [facility]. I called today and left a message and will discharge her if no response from Ms KeMcweeney         Comments: 30 Day Progress Note

## 2015-10-09 NOTE — Progress Notes (Signed)
Pulmonary Individual Treatment Plan  Patient Details  Name: Andrea [name] MRN: [id number] Date of Birth: 03/30/1941 Referring Provider: Dr Merton Border   Initial Encounter Date:       Pulmonary Rehab from 08/28/2015 in [facility] Cardiac and Pulmonary Rehab   Date  08/28/15      Visit Diagnosis: COPD, moderate ([place])  Patient's Home Medications on Admission:  Current outpatient prescriptions:    acetaminophen (TYLENOL) 650 MG CR tablet, Take 1,300 mg by mouth 2 (two) times daily., Disp: , Rfl:    albuterol (PROVENTIL HFA;VENTOLIN HFA) 108 (90 Base) MCG/ACT inhaler, Inhale 2 puffs into the lungs every 6 (six) hours as needed for wheezing., Disp: 1 Inhaler, Rfl: 11   aspirin 81 MG tablet, Take 81 mg by mouth daily., Disp: , Rfl:    budesonide-formoterol (SYMBICORT) 160-4.5 MCG/ACT inhaler, Inhale 2 puffs into the lungs 2 (two) times daily., Disp: 1 Inhaler, Rfl: 0   Calcium Carbonate-Vitamin D (CALCIUM + D PO), Take 1 tablet by mouth 2 (two) times daily., Disp: , Rfl:    clopidogrel (PLAVIX) 75 MG tablet, TAKE 1 TABLET (75 MG TOTAL) BY MOUTH DAILY., Disp: 90 tablet, Rfl: 3   diclofenac sodium (VOLTAREN) 1 % GEL, Apply 2 g topically 3 (three) times daily as needed (pain)., Disp: , Rfl:    fluticasone (FLONASE) 50 MCG/ACT nasal spray, Place 2 sprays into the nose 2 (two) times daily as needed. For congestion, Disp: , Rfl:    hydrochlorothiazide (HYDRODIURIL) 25 MG tablet, Take 1 tablet (25 mg total) by mouth every morning., Disp: 90 tablet, Rfl: 3   HYDROcodone-acetaminophen (NORCO/VICODIN) 5-325 MG per tablet, Take 1 tablet by mouth 2 (two) times daily as needed for pain., Disp: 30 tablet, Rfl: 1   isosorbide mononitrate (IMDUR) 30 MG 24 hr tablet, TAKE 1 TABLET (30 MG TOTAL) BY MOUTH DAILY., Disp: 90 tablet, Rfl: 3   Krill Oil 1000 MG CAPS, Take 1,000 mg by mouth daily., Disp: , Rfl:    losartan (COZAAR) 100 MG tablet, TAKE 1 TABLET (100 MG TOTAL) BY MOUTH DAILY., Disp: 90 tablet,  Rfl: 3   montelukast (SINGULAIR) 10 MG tablet, TAKE 1 TABLET BY MOUTH AT BEDTIME, Disp: 30 tablet, Rfl: 2   mupirocin ointment (BACTROBAN) 2 %, Place 1 application into the nose daily. Via qtip to ear daily, Disp: 22 g, Rfl: 0   nitroGLYCERIN (NITROSTAT) 0.4 MG SL tablet, Place 1 tablet (0.4 mg total) under the tongue every 5 (five) minutes as needed for chest pain., Disp: 25 tablet, Rfl: 6   pantoprazole (PROTONIX) 40 MG tablet, TAKE 1 TABLET (40 MG TOTAL) BY MOUTH DAILY., Disp: 90 tablet, Rfl: 3   Polyethyl Glycol-Propyl Glycol (SYSTANE OP), Place 1 drop into both eyes daily as needed (dry eyes)., Disp: , Rfl:    potassium chloride (K-DUR) 10 MEQ tablet, Take 1 tablet (10 mEq total) by mouth daily as needed., Disp: 90 tablet, Rfl: 3   rosuvastatin (CRESTOR) 40 MG tablet, Take 1 tablet (40 mg total) by mouth daily., Disp: 90 tablet, Rfl: 3   sertraline (ZOLOFT) 50 MG tablet, TAKE 1 TABLET EVERY DAY **NEED OFFICE VISIT, Disp: 90 tablet, Rfl: 1   Spacer/Aero-Holding Chambers DEVI, 1 spacer to use prn with HFA device as directed, Disp: 1 each, Rfl: 0   tiotropium (SPIRIVA) 18 MCG inhalation capsule, Place 1 capsule (18 mcg total) into inhaler and inhale daily., Disp: 30 capsule, Rfl: 11   triamcinolone ointment (KENALOG) 0.5 %, Apply 1 application topically 2 (two)  times daily., Disp: 30 g, Rfl: 2  Past Medical History: Past Medical History  Diagnosis Date   COPD (chronic obstructive pulmonary disease) ([place])    Cerebrovascular disease 01/2009    carotid u/s  R 0-39%   L 60-79%   CAD (coronary artery disease)     s/CABG (reports IMA and 2 SVGs) back in 1999  Myoview normal 3/10; s/p PCI with DES to PL branch of distal RCA 09/2011   Depression    GERD (gastroesophageal reflux disease)    Hypertension    Hyperlipidemia    Osteoporosis    PVD (peripheral vascular disease) ([facility])    Dizziness    AAA (abdominal aortic aneurysm) ([place]) 01/2009    AAA (2.8 x 3.0)  moderate RAS  (left); 2.7 x 2.7 cm (07/11/05)   NSTEMI (non-ST elevated myocardial infarction) ([place]) 11/12    Cath showed atretic IMA graft to the LAD, SVG to PD was patent but the continuation of this graft to the PL branch was occluded; there were L to R collaterals; Mid LAD had a 60 to 70% stenosis. She has been treated medically.  Neg Myoview 05/2011   Bradycardia     Metoprolol stopped 08/2011   Angina    Dysrhythmia     hx of sinus brady   Shortness of breath    Blood transfusion     hx of transfusion    Herniated lumbar disc without myelopathy    Anosmia    Acute bronchitis 03/20/2013   Medicare annual wellness visit, subsequent 07/31/2013   Thoracic back pain 01/24/2014   Baker's cyst of knee 01/30/2014   Asthma     Tobacco Use: History  Smoking status   Former Smoker -- 0.50 packs/day for 40 years   Types: Cigarettes   Quit date: 01/19/1993  Smokeless tobacco   Never Used    Labs: Recent Review Flowsheet Data    Labs for ITP Cardiac and Pulmonary Rehab Latest Ref Rng 06/07/2012 03/17/2013 07/26/2013 01/24/2014 02/08/2015   Cholestrol 0 - 200 mg/dL 122 306(H) 135 136 144   LDLCALC 0 - 99 mg/dL 42 199(H) 61 75 -   LDLDIRECT - - - - - 83.0   HDL >39.00 mg/dL 30(L) 34(L) 31(L) 34.50(L) 30.90(L)   Trlycerides 0.0 - 149.0 mg/dL 250(H) 364(H) 214(H) 134.0 330.0(H)   Hemoglobin A1c 4.6 - 6.5 % - - - - 6.1       ADL UCSD:     Pulmonary Assessment Scores      08/28/15 1115       ADL UCSD   ADL Phase Entry     SOB Score total 17     Rest 1     Walk 1     Stairs 2     Bath 0     Dress 0     Shop 1        Pulmonary Function Assessment:     Pulmonary Function Assessment - 08/28/15 1115    Pulmonary Function Tests   RV% 69 %   DLCO% 79 %   Initial Spirometry Results   FVC% 87 %   FEV1% 65 %   FEV1/FVC Ratio 56   Comments Performed 07/25/2015   Post Bronchodilator Spirometry Results   FVC% 92 %   FEV1% 69 %   FEV1/FVC Ratio 57   Breath   Bilateral Breath  Sounds Decreased;Clear   Shortness of Breath Yes;Limiting activity      Exercise Target Goals:  Exercise Program Goal: Individual exercise prescription set with THRR, safety & activity barriers. Participant demonstrates ability to understand and report RPE using BORG scale, to self-measure pulse accurately, and to acknowledge the importance of the exercise prescription.  Exercise Prescription Goal: Starting with aerobic activity 30 plus minutes a day, 3 days per week for initial exercise prescription. Provide home exercise prescription and guidelines that participant acknowledges understanding prior to discharge.  Activity Barriers & Risk Stratification:     Activity Barriers & Cardiac Risk Stratification - 08/28/15 1115    Activity Barriers & Cardiac Risk Stratification   Activity Barriers Back Problems;Shortness of Breath;Deconditioning   Cardiac Risk Stratification Moderate      6 Minute Walk:     6 Minute Walk      08/28/15 1216       6 Minute Walk   Phase Initial     Distance 1500 feet     Walk Time 6 minutes     # of Rest Breaks 0     MPH 2.8     RPE 13     Perceived Dyspnea  3     Resting HR 56 bpm     Resting BP 100/60 mmHg     Max Ex. HR 81 bpm     Max Ex. BP 140/68 mmHg        Initial Exercise Prescription:     Initial Exercise Prescription - 08/28/15 1200    Date of Initial Exercise RX and Referring Provider   Date 08/28/15   Recumbant Bike   Level 2   RPM 40   Watts 20   Minutes 15   NuStep   Level 2   Watts 40   Minutes 15   Recumbant Elliptical   Level 2   RPM 40   Watts 20   Minutes 15   REL-XR   Level 2   Watts 50   Minutes 15   Biostep-RELP   Level 1   Watts 15   Minutes 15   Prescription Details   Frequency (times per week) 2   Duration Progress to 45 minutes of aerobic exercise without signs/symptoms of physical distress   Intensity   THRR REST +  30   Ratings of Perceived Exertion 11-15   Perceived Dyspnea 0-4    Progression   Progression Continue to progress workloads to maintain intensity without signs/symptoms of physical distress.   Resistance Training   Training Prescription Yes   Weight 2   Reps 10-15      Perform Capillary Blood Glucose checks as needed.  Exercise Prescription Changes:     Exercise Prescription Changes      09/03/15 1221           Response to Exercise   Blood Pressure (Admit) 122/62 mmHg       Blood Pressure (Exercise) 132/60 mmHg       Blood Pressure (Exit) 98/64 mmHg       Heart Rate (Admit) 62 bpm       Heart Rate (Exercise) 100 bpm       Heart Rate (Exit) 56 bpm       Oxygen Saturation (Admit) 95 %  Room air       Oxygen Saturation (Exercise) 91 %  room air       Oxygen Saturation (Exit) 98 %  Room air       Rating of Perceived Exertion (Exercise) 11       Perceived Dyspnea (Exercise) 2  Symptoms --  None       Duration Progress to 30 minutes of continuous aerobic without signs/symptoms of physical distress       Intensity Rest + 30       Resistance Training   Weight 2       Reps 10-15       Treadmill   MPH 2       Grade 1       Minutes 15       NuStep   Level 2       Watts 40       Minutes 15       REL-XR   Level 2       Watts 50       Minutes 15          Exercise Comments:   Discharge Exercise Prescription (Final Exercise Prescription Changes):     Exercise Prescription Changes - 09/03/15 1221    Response to Exercise   Blood Pressure (Admit) 122/62 mmHg   Blood Pressure (Exercise) 132/60 mmHg   Blood Pressure (Exit) 98/64 mmHg   Heart Rate (Admit) 62 bpm   Heart Rate (Exercise) 100 bpm   Heart Rate (Exit) 56 bpm   Oxygen Saturation (Admit) 95 %  Room air   Oxygen Saturation (Exercise) 91 %  room air   Oxygen Saturation (Exit) 98 %  Room air   Rating of Perceived Exertion (Exercise) 11   Perceived Dyspnea (Exercise) 2   Symptoms --  None   Duration Progress to 30 minutes of continuous aerobic without  signs/symptoms of physical distress   Intensity Rest + 30   Resistance Training   Weight 2   Reps 10-15   Treadmill   MPH 2   Grade 1   Minutes 15   NuStep   Level 2   Watts 40   Minutes 15   REL-XR   Level 2   Watts 50   Minutes 15       Nutrition:  Target Goals: Understanding of nutrition guidelines, daily intake of sodium <157m, cholesterol <2082m calories 30% from fat and 7% or less from saturated fats, daily to have 5 or more servings of fruits and vegetables.  Biometrics:     Pre Biometrics - 08/28/15 1218    Pre Biometrics   Height 5' 1" (1.549 m)   Weight 145 lb 9.6 oz (66.044 kg)   Waist Circumference 34 inches   Hip Circumference 41 inches   Waist to Hip Ratio 0.83 %   BMI (Calculated) 27.6       Nutrition Therapy Plan and Nutrition Goals:     Nutrition Therapy & Goals - 08/28/15 1115    Nutrition Therapy   Diet Prefers not to meet with the dietitian; She is participating in [employer] has lost 10lbs with 10lbs more to go.      Nutrition Discharge: Rate Your Plate Scores:   Psychosocial: Target Goals: Acknowledge presence or absence of depression, maximize coping skills, provide positive support system. Participant is able to verbalize types and ability to use techniques and skills needed for reducing stress and depression.  Initial Review & Psychosocial Screening:     Initial Psych Review & Screening - 08/28/15 1115    Family Dynamics   Good Support System? Yes   Comments Andrea [name] some support from her husband, although he does have back problems and is unable to help with any work around the house. She  does have some depression related to her shortness of breath and inability to do some activities she did before.                                    Barriers   Psychosocial barriers to participate in program The patient should benefit from training in stress management and relaxation.   Screening Interventions   Interventions  Encouraged to exercise;Program counselor consult      Quality of Life Scores:     Quality of Life - 08/28/15 1115    Quality of Life Scores   Health/Function Pre 20.87 %   Socioeconomic Pre 28.58 %   Psych/Spiritual Pre 27.43 %   Family Pre 27.6 %   GLOBAL Pre 24.68 %      PHQ-9:     Recent Review Flowsheet Data    Depression screen [facility] 2/9 08/28/2015 02/08/2015 07/31/2013 07/26/2013   Decreased Interest 0 0 0 0   Down, Depressed, Hopeless 1 0 0 0   PHQ - 2 Score 1 0 0 0   Altered sleeping 1 0 - -   Tired, decreased energy 2 0 - -   Change in appetite 1 0 - -   Feeling bad or failure about yourself  0 0 - -   Trouble concentrating 1 0 - -   Moving slowly or fidgety/restless 0 0 - -   Suicidal thoughts 0 0 - -   PHQ-9 Score 6 0 - -   Difficult doing work/chores Somewhat difficult Not difficult at all - -      Psychosocial Evaluation and Intervention:     Psychosocial Evaluation - 09/03/15 1213    Psychosocial Evaluation & Interventions   Interventions Relaxation education;Encouraged to exercise with the program and follow exercise prescription   Comments Counselor met with Andrea [name] today for initial psychosocial evaluation.  She is a 75 year old who has both Asthma and COPD, as well as a cardiac history of health issues.  She has a strong support system with a spouse of 54 years and an adult son who lives in the home.  Andrea [name] is also actively involved in her local church.  She states that she sleeps well, has a good appetite and has a history of depression and anxiety that  is being treated with medication successfully.  She reports she is typically  in a positive mood and has very few stressors in her life currently.  Her goals for this program are to lose weight and to exercise consistently.  Counselor encouraged her to meet with the dietician to discuss her weight loss goals.        Psychosocial Re-Evaluation:  Education: Education Goals: Education classes will be  provided on a weekly basis, covering required topics. Participant will state understanding/return demonstration of topics presented.  Learning Barriers/Preferences:     Learning Barriers/Preferences - 08/28/15 1115    Learning Barriers/Preferences   Learning Barriers None   Learning Preferences Group Instruction;Individual Instruction;Pictoral;Skilled Demonstration;Verbal Instruction;Video;Written Material      Education Topics: Initial Evaluation Education: - Verbal, written and demonstration of respiratory meds, RPE/PD scales, oximetry and breathing techniques. Instruction on use of nebulizers and MDIs: cleaning and proper use, rinsing mouth with steroid doses and importance of monitoring MDI activations.          Pulmonary Rehab from 09/03/2015 in [facility] Cardiac and Pulmonary Rehab   Date  08/28/15  Educator  LB   Instruction Review Code  2- meets goals/outcomes      General Nutrition Guidelines/Fats and Fiber: -Group instruction provided by verbal, written material, models and posters to present the general guidelines for heart healthy nutrition. Gives an explanation and review of dietary fats and fiber.   Controlling Sodium/Reading Food Labels: -Group verbal and written material supporting the discussion of sodium use in heart healthy nutrition. Review and explanation with models, verbal and written materials for utilization of the food label.   Exercise Physiology & Risk Factors: - Group verbal and written instruction with models to review the exercise physiology of the cardiovascular system and associated critical values. Details cardiovascular disease risk factors and the goals associated with each risk factor.   Aerobic Exercise & Resistance Training: - Gives group verbal and written discussion on the health impact of inactivity. On the components of aerobic and resistive training programs and the benefits of this training and how to safely progress through these  programs.   Flexibility, Balance, General Exercise Guidelines: - Provides group verbal and written instruction on the benefits of flexibility and balance training programs. Provides general exercise guidelines with specific guidelines to those with heart or lung disease. Demonstration and skill practice provided.   Stress Management: - Provides group verbal and written instruction about the health risks of elevated stress, cause of high stress, and healthy ways to reduce stress.   Depression: - Provides group verbal and written instruction on the correlation between heart/lung disease and depressed mood, treatment options, and the stigmas associated with seeking treatment.   Exercise & Equipment Safety: - Individual verbal instruction and demonstration of equipment use and safety with use of the equipment.      Pulmonary Rehab from 09/03/2015 in [facility] Cardiac and Pulmonary Rehab   Date  09/03/15   Educator  C. EnterkinRN   Instruction Review Code  2- meets goals/outcomes      Infection Prevention: - Provides verbal and written material to individual with discussion of infection control including proper hand washing and proper equipment cleaning during exercise session.   Falls Prevention: - Provides verbal and written material to individual with discussion of falls prevention and safety.      Pulmonary Rehab from 09/03/2015 in [facility] Cardiac and Pulmonary Rehab   Date  08/28/15   Educator  LB   Instruction Review Code  2- meets goals/outcomes      Diabetes: - Individual verbal and written instruction to review signs/symptoms of diabetes, desired ranges of glucose level fasting, after meals and with exercise. Advice that pre and post exercise glucose checks will be done for 3 sessions at entry of program.   Chronic Lung Diseases: - Group verbal and written instruction to review new updates, new respiratory medications, new advancements in procedures and treatments. Provide  informative websites and "800" numbers of self-education.   Lung Procedures: - Group verbal and written instruction to describe testing methods done to diagnose lung disease. Review the outcome of test results. Describe the treatment choices: Pulmonary Function Tests, ABGs and oximetry.   Energy Conservation: - Provide group verbal and written instruction for methods to conserve energy, plan and organize activities. Instruct on pacing techniques, use of adaptive equipment and posture/positioning to relieve shortness of breath.   Triggers: - Group verbal and written instruction to review types of environmental controls: home humidity, furnaces, filters, dust mite/pet prevention, HEPA vacuums. To discuss weather changes, air quality and the benefits of nasal washing.   Exacerbations: -  Group verbal and written instruction to provide: warning signs, infection symptoms, calling MD promptly, preventive modes, and value of vaccinations. Review: effective airway clearance, coughing and/or vibration techniques. Create an Sports administrator.   Oxygen: - Individual and group verbal and written instruction on oxygen therapy. Includes supplement oxygen, available portable oxygen systems, continuous and intermittent flow rates, oxygen safety, concentrators, and Medicare reimbursement for oxygen.   Respiratory Medications: - Group verbal and written instruction to review medications for lung disease. Drug class, frequency, complications, importance of spacers, rinsing mouth after steroid MDI's, and proper cleaning methods for nebulizers.      Pulmonary Rehab from 09/03/2015 in [facility] Cardiac and Pulmonary Rehab   Date  08/28/15   Educator  LB   Instruction Review Code  2- meets goals/outcomes      AED/CPR: - Group verbal and written instruction with the use of models to demonstrate the basic use of the AED with the basic ABC's of resuscitation.   Breathing Retraining: - Provides individuals verbal and  written instruction on purpose, frequency, and proper technique of diaphragmatic breathing and pursed-lipped breathing. Applies individual practice skills.      Pulmonary Rehab from 09/03/2015 in [facility] Cardiac and Pulmonary Rehab   Date  08/28/15   Educator  LB   Instruction Review Code  2- meets goals/outcomes      Anatomy and Physiology of the Lungs: - Group verbal and written instruction with the use of models to provide basic lung anatomy and physiology related to function, structure and complications of lung disease.   Heart Failure: - Group verbal and written instruction on the basics of heart failure: signs/symptoms, treatments, explanation of ejection fraction, enlarged heart and cardiomyopathy.   Sleep Apnea: - Individual verbal and written instruction to review Obstructive Sleep Apnea. Review of risk factors, methods for diagnosing and types of masks and machines for OSA.   Anxiety: - Provides group, verbal and written instruction on the correlation between heart/lung disease and anxiety, treatment options, and management of anxiety.   Relaxation: - Provides group, verbal and written instruction about the benefits of relaxation for patients with heart/lung disease. Also provides patients with examples of relaxation techniques.   Knowledge Questionnaire Score:     Knowledge Questionnaire Score - 08/28/15 1115    Knowledge Questionnaire Score   Pre Score 8/10       Core Components/Risk Factors/Patient Goals at Admission:     Personal Goals and Risk Factors at Admission - 08/28/15 1115    Core Components/Risk Factors/Patient Goals on Admission   Increase Strength and Stamina Yes  Andrea [name] does like to walk, but is limited by her shortness of breath.   Intervention Provide advice, education, support and counseling about physical activity/exercise needs.;Develop an individualized exercise prescription for aerobic and resistive training based on initial evaluation  findings, risk stratification, comorbidities and participant's personal goals.  Make one of her exercies goals the treadmill   Expected Outcomes Achievement of increased cardiorespiratory fitness and enhanced flexibility, muscular endurance and strength shown through measurements of functional capacity and personal statement of participant.   Improve shortness of breath with ADL's Yes   Intervention Provide education, individualized exercise plan and daily activity instruction to help decrease symptoms of SOB with activities of daily living.   Expected Outcomes Short Term: Achieves a reduction of symptoms when performing activities of daily living.   Develop more efficient breathing techniques such as purse lipped breathing and diaphragmatic breathing; and practicing self-pacing with activity Yes   Intervention  Provide education, demonstration and support about specific breathing techniuqes utilized for more efficient breathing. Include techniques such as pursed lipped breathing, diaphragmatic breathing and self-pacing activity.   Expected Outcomes Short Term: Participant will be able to demonstrate and use breathing techniques as needed throughout daily activities.   Increase knowledge of respiratory medications and ability to use respiratory devices properly  Yes  Andrea [name] uses Albuterol, Symbicort, and Spiriva; she has a spacer.   Intervention Provide education and demonstration as needed of appropriate use of medications, inhalers, and oxygen therapy.   Expected Outcomes Short Term: Achieves understanding of medications use. Understands that oxygen is a medication prescribed by physician. Demonstrates appropriate use of inhaler and oxygen therapy.   Hypertension Yes   Intervention Provide education on lifestyle modifcations including regular physical activity/exercise, weight management, moderate sodium restriction and increased consumption of fresh fruit, vegetables, and low fat dairy, alcohol  moderation, and smoking cessation.;Monitor prescription use compliance.   Expected Outcomes Short Term: Continued assessment and intervention until BP is < 140/80m HG in hypertensive participants. < 130/873mHG in hypertensive participants with diabetes, heart failure or chronic kidney disease.;Long Term: Maintenance of blood pressure at goal levels.      Core Components/Risk Factors/Patient Goals Review:    Core Components/Risk Factors/Patient Goals at Discharge (Final Review):    ITP Comments:     ITP Comments      10/09/15 0841 10/09/15 1339         ITP Comments Andrea [name] been called several times with no response. Her last date of attendence was 09/03/15 - 2nd session of [facility]. I called today and left a message and will discharge her if no response from Andrea KeTallonNo response from Andrea [name] plan to discharge her.         Comments: Discharging Andrea [name]

## 2015-10-10 ENCOUNTER — Ambulatory Visit: Payer: PPO

## 2015-10-12 ENCOUNTER — Ambulatory Visit: Payer: PPO

## 2015-10-15 ENCOUNTER — Ambulatory Visit: Payer: PPO

## 2015-10-17 ENCOUNTER — Ambulatory Visit: Payer: PPO

## 2015-10-19 ENCOUNTER — Ambulatory Visit: Payer: PPO

## 2015-10-23 ENCOUNTER — Ambulatory Visit (INDEPENDENT_AMBULATORY_CARE_PROVIDER_SITE_OTHER): Payer: PPO | Admitting: Family Medicine

## 2015-10-23 ENCOUNTER — Encounter: Payer: Self-pay | Admitting: Family Medicine

## 2015-10-23 VITALS — BP 104/70 | HR 57 | Temp 98.7°F | Ht 62.0 in | Wt 141.4 lb

## 2015-10-23 DIAGNOSIS — I10 Essential (primary) hypertension: Secondary | ICD-10-CM

## 2015-10-23 DIAGNOSIS — J209 Acute bronchitis, unspecified: Secondary | ICD-10-CM

## 2015-10-23 DIAGNOSIS — E785 Hyperlipidemia, unspecified: Secondary | ICD-10-CM | POA: Diagnosis not present

## 2015-10-23 DIAGNOSIS — J449 Chronic obstructive pulmonary disease, unspecified: Secondary | ICD-10-CM

## 2015-10-23 DIAGNOSIS — I739 Peripheral vascular disease, unspecified: Secondary | ICD-10-CM

## 2015-10-23 DIAGNOSIS — M546 Pain in thoracic spine: Secondary | ICD-10-CM

## 2015-10-23 DIAGNOSIS — Z1239 Encounter for other screening for malignant neoplasm of breast: Secondary | ICD-10-CM

## 2015-10-23 DIAGNOSIS — R739 Hyperglycemia, unspecified: Secondary | ICD-10-CM

## 2015-10-23 DIAGNOSIS — M810 Age-related osteoporosis without current pathological fracture: Secondary | ICD-10-CM

## 2015-10-23 HISTORY — DX: Hyperglycemia, unspecified: R73.9

## 2015-10-23 LAB — LIPID PANEL
CHOL/HDL RATIO: 7
Cholesterol: 242 mg/dL — ABNORMAL HIGH (ref 0–200)
HDL: 33.5 mg/dL — ABNORMAL LOW (ref >39.00)
LDL CALC: 180 mg/dL — AB (ref 0–99)
NONHDL: 208.79
Triglycerides: 145 mg/dL (ref 0.0–149.0)
VLDL: 29 mg/dL (ref 0.0–40.0)

## 2015-10-23 LAB — CBC
HEMATOCRIT: 40.3 % (ref 36.0–46.0)
Hemoglobin: 13.3 g/dL (ref 12.0–15.0)
MCHC: 33.1 g/dL (ref 30.0–36.0)
MCV: 82.3 fl (ref 78.0–100.0)
Platelets: 174 10*3/uL (ref 150.0–400.0)
RBC: 4.89 Mil/uL (ref 3.87–5.11)
RDW: 16 % — ABNORMAL HIGH (ref 11.5–15.5)
WBC: 8.6 10*3/uL (ref 4.0–10.5)

## 2015-10-23 LAB — HEMOGLOBIN A1C: Hgb A1c MFr Bld: 6.3 % (ref 4.6–6.5)

## 2015-10-23 LAB — COMPREHENSIVE METABOLIC PANEL
ALT: 9 U/L (ref 0–35)
AST: 12 U/L (ref 0–37)
Albumin: 4 g/dL (ref 3.5–5.2)
Alkaline Phosphatase: 70 U/L (ref 39–117)
BUN: 14 mg/dL (ref 6–23)
CALCIUM: 9.4 mg/dL (ref 8.4–10.5)
CHLORIDE: 104 meq/L (ref 96–112)
CO2: 31 meq/L (ref 19–32)
CREATININE: 0.9 mg/dL (ref 0.40–1.20)
GFR: 64.6 mL/min (ref >60.00)
GLUCOSE: 103 mg/dL — AB (ref 70–99)
Potassium: 3.9 mEq/L (ref 3.5–5.1)
SODIUM: 141 meq/L (ref 135–145)
Total Bilirubin: 0.6 mg/dL (ref 0.2–1.2)
Total Protein: 6.7 g/dL (ref 6.0–8.3)

## 2015-10-23 LAB — TSH: TSH: 3.09 u[IU]/mL (ref 0.35–4.50)

## 2015-10-23 MED ORDER — DOXYCYCLINE HYCLATE 100 MG PO TABS: 100.0000 mg | ORAL_TABLET | Freq: Two times a day (BID) | ORAL | Status: DC

## 2015-10-23 MED ORDER — HYDROCODONE-HOMATROPINE 5-1.5 MG/5ML PO SYRP: 5.0000 mL | ORAL_SOLUTION | Freq: Three times a day (TID) | ORAL | Status: DC | PRN

## 2015-10-23 MED ORDER — HYDROCODONE-ACETAMINOPHEN 5-325 MG PO TABS: 1.0000 | ORAL_TABLET | Freq: Two times a day (BID) | ORAL | Status: DC | PRN

## 2015-10-23 NOTE — Assessment & Plan Note (Signed)
Encouraged heart healthy diet, increase exercise, avoid trans fats, consider a krill oil cap daily 

## 2015-10-23 NOTE — Assessment & Plan Note (Signed)
Encouraged increased rest and hydration, add probiotics, zinc such as Coldeze or Xicam. Treat fevers as needed. Elderberry, vitamin c and started on Doxycycline and Hydromet prn

## 2015-10-23 NOTE — Assessment & Plan Note (Signed)
Well controlled, no changes to meds. Encouraged heart healthy diet such as the DASH diet and exercise as tolerated.  °

## 2015-10-23 NOTE — Progress Notes (Signed)
Subjective:    [name], female    DOB: 24-Jun-1940, 75 y.o.   MRN: [id number]  Chief Complaint  Patient presents with  . Follow-up    HPI Patient is in today for follow up with numerous complaints. Is noting headache, fatigue, sob, cough, malaise and back pain. The back pain is chronic and tolerable. last couple of days has noted increased headache expecially in the morning. Denies CP/palp/SOB/congestion/fevers/GI or GU c/o. Taking meds as prescribed  Past Medical History  Diagnosis Date  . COPD (chronic obstructive pulmonary disease) ([place])   . Cerebrovascular disease 01/2009    carotid u/s  R 0-39%   L 60-79%  . CAD (coronary artery disease)     s/CABG (reports IMA and 2 SVGs) back in 1999  Myoview normal 3/10; s/p PCI with DES to PL branch of distal RCA 09/2011  . Depression   . GERD (gastroesophageal reflux disease)   . Hypertension   . Hyperlipidemia   . Osteoporosis   . PVD (peripheral vascular disease) ([place])   . Dizziness   . AAA (abdominal aortic aneurysm) ([place]) 01/2009    AAA (2.8 x 3.0)  moderate RAS (left); 2.7 x 2.7 cm (07/11/05)  . NSTEMI (non-ST elevated myocardial infarction) ([place]) 11/12    Cath showed atretic IMA graft to the LAD, SVG to PD was patent but the continuation of this graft to the PL branch was occluded; there were L to R collaterals; Mid LAD had a 60 to 70% stenosis. She has been treated medically.  Neg Myoview 05/2011  . Bradycardia     Metoprolol stopped 08/2011  . Angina   . Dysrhythmia     hx of sinus brady  . Shortness of breath   . Blood transfusion     hx of transfusion   . Herniated lumbar disc without myelopathy   . Anosmia   . Acute bronchitis 03/20/2013  . Medicare annual wellness visit, subsequent 07/31/2013  . Thoracic back pain 01/24/2014  . Baker's cyst of knee 01/30/2014  . Asthma   . Hyperglycemia 10/23/2015    Past Surgical History  Procedure Laterality Date  . Coronary artery bypass graft  1999  . Tubal ligation     . Abdominal aortic aneurysm repair    . Femoral artery stent    . Closed reduction nasal fracture  11/2007  . Carotid endarterectomy  01/02/11    left  . Coronary angioplasty with stent placement  10/10/2011    drug eluting  to rc & saphenous  . Nasal sinus surgery      twice  . Left heart catheterization with coronary/graft angiogram N/A 04/22/2011    Procedure: LEFT HEART CATHETERIZATION WITH Beatrix Fetters;  Surgeon: Jolaine Artist, MD;  Location: [facility] CATH LAB;  Service: Cardiovascular;  Laterality: N/A;  . Left heart catheterization with coronary/graft angiogram N/A 10/10/2011    Procedure: LEFT HEART CATHETERIZATION WITH Beatrix Fetters;  Surgeon: Peter M Martinique, MD;  Location: [facility] CATH LAB;  Service: Cardiovascular;  Laterality: N/A;    Family History  Problem Relation Age of Onset  . Heart attack Mother 60  . Diabetes Mother   . Colon cancer Father 53  . Lung cancer Father     smoked  . Heart disease Father     angina  . Hypertension Father   . Cancer Father     colon, lung  . Lung cancer Sister     smoked  . Cancer Sister   .  Hypothyroidism Sister   . Hypertension Brother   . Heart attack Brother   . Heart disease Brother     stent  . Heart attack Brother     CABG  . Heart disease Brother     CABG with 1 bypass  . Aneurysm Brother     brain  . Alcohol abuse Brother   . Hyperlipidemia Daughter   . Diabetes Maternal Grandmother   . Stroke Maternal Grandmother   . Cirrhosis Maternal Grandfather   . Stroke Paternal Grandmother   . Obesity Daughter   . Obesity Son     Social History   Social History  . Marital Status: Married    Spouse Name: N/A  . Number of Children: 5  . Years of Education: N/A   Occupational History  . Retired     former  Marine scientist   Social History Main Topics  . Smoking status: Former Smoker -- 0.50 packs/day for 40 years    Types: Cigarettes    Quit date: 01/19/1993  . Smokeless tobacco: Never Used  . Alcohol  Use: No     Comment: rare  . Drug Use: No  . Sexual Activity: No   Other Topics Concern  . Not on file   Social History Narrative   Married with 5 children    Outpatient Prescriptions Prior to Visit  Medication Sig Dispense Refill  . acetaminophen (TYLENOL) 650 MG CR tablet Take 1,300 mg by mouth 2 (two) times daily.    Marland Kitchen albuterol (PROVENTIL HFA;VENTOLIN HFA) 108 (90 Base) MCG/ACT inhaler Inhale 2 puffs into the lungs every 6 (six) hours as needed for wheezing. 1 Inhaler 11  . aspirin 81 MG tablet Take 81 mg by mouth daily.    . Calcium Carbonate-Vitamin D (CALCIUM + D PO) Take 1 tablet by mouth 2 (two) times daily.    . clopidogrel (PLAVIX) 75 MG tablet TAKE 1 TABLET (75 MG TOTAL) BY MOUTH DAILY. 90 tablet 3  . diclofenac sodium (VOLTAREN) 1 % GEL Apply 2 g topically 3 (three) times daily as needed (pain).    . fluticasone (FLONASE) 50 MCG/ACT nasal spray Place 2 sprays into the nose 2 (two) times daily as needed. For congestion    . hydrochlorothiazide (HYDRODIURIL) 25 MG tablet Take 1 tablet (25 mg total) by mouth every morning. 90 tablet 3  . isosorbide mononitrate (IMDUR) 30 MG 24 hr tablet TAKE 1 TABLET (30 MG TOTAL) BY MOUTH DAILY. 90 tablet 3  . Krill Oil 1000 MG CAPS Take 1,000 mg by mouth daily.    Marland Kitchen losartan (COZAAR) 100 MG tablet TAKE 1 TABLET (100 MG TOTAL) BY MOUTH DAILY. 90 tablet 3  . montelukast (SINGULAIR) 10 MG tablet TAKE 1 TABLET BY MOUTH AT BEDTIME 30 tablet 2  . mupirocin ointment (BACTROBAN) 2 % Place 1 application into the nose daily. Via qtip to ear daily 22 g 0  . nitroGLYCERIN (NITROSTAT) 0.4 MG SL tablet Place 1 tablet (0.4 mg total) under the tongue every 5 (five) minutes as needed for chest pain. 25 tablet 6  . pantoprazole (PROTONIX) 40 MG tablet TAKE 1 TABLET (40 MG TOTAL) BY MOUTH DAILY. 90 tablet 3  . Polyethyl Glycol-Propyl Glycol (SYSTANE OP) Place 1 drop into both eyes daily as needed (dry eyes).    . potassium chloride (K-DUR) 10 MEQ tablet Take 1  tablet (10 mEq total) by mouth daily as needed. 90 tablet 3  . rosuvastatin (CRESTOR) 40 MG tablet Take 1 tablet (40  mg total) by mouth daily. 90 tablet 3  . sertraline (ZOLOFT) 50 MG tablet TAKE 1 TABLET EVERY DAY **NEED OFFICE VISIT 90 tablet 1  . Spacer/Aero-Holding Chambers DEVI 1 spacer to use prn with HFA device as directed 1 each 0  . tiotropium (SPIRIVA) 18 MCG inhalation capsule Place 1 capsule (18 mcg total) into inhaler and inhale daily. 30 capsule 11  . triamcinolone ointment (KENALOG) 0.5 % Apply 1 application topically 2 (two) times daily. 30 g 2  . HYDROcodone-acetaminophen (NORCO/VICODIN) 5-325 MG per tablet Take 1 tablet by mouth 2 (two) times daily as needed for pain. 30 tablet 1  . budesonide-formoterol (SYMBICORT) 160-4.5 MCG/ACT inhaler Inhale 2 puffs into the lungs 2 (two) times daily. 1 Inhaler 0   No facility-administered medications prior to visit.    Allergies  Allergen Reactions  . Ciprofloxacin     REACTION: rash IV  . Codeine     REACTION: nausea/vomiting  . Erythromycin     REACTION: tongue burns  . Meperidine Hcl     REACTION: Nausea/vomiting  . Penicillins     REACTION: rash  . Shellfish Allergy Nausea And Vomiting  . Tape Rash    Review of Systems  Constitutional: Negative for fever and malaise/fatigue.  HENT: Positive for congestion.   Eyes: Negative for blurred vision.  Respiratory: Positive for cough, sputum production and shortness of breath.   Cardiovascular: Negative for chest pain, palpitations and leg swelling.  Gastrointestinal: Negative for nausea, abdominal pain and blood in stool.  Genitourinary: Negative for dysuria and frequency.  Musculoskeletal: Positive for back pain. Negative for falls.  Skin: Negative for rash.  Neurological: Positive for headaches. Negative for dizziness and loss of consciousness.  Endo/Heme/Allergies: Negative for environmental allergies.  Psychiatric/Behavioral: Negative for depression. The patient is not  nervous/anxious.        Objective:    Physical Exam  Constitutional: She is oriented to person, place, and time. She appears well-developed and well-nourished. No distress.  HENT:  Head: Normocephalic and atraumatic.  Nose: Nose normal.  Eyes: Right eye exhibits no discharge. Left eye exhibits no discharge.  Neck: Normal range of motion. Neck supple.  Cardiovascular: Normal rate and regular rhythm.   No murmur heard. Pulmonary/Chest: Effort normal and breath sounds normal.  Slight rhonchi left lower lobe  Abdominal: Soft. Bowel sounds are normal. There is no tenderness.  Musculoskeletal: She exhibits no edema.  Neurological: She is alert and oriented to person, place, and time.  Skin: Skin is warm and dry.  Psychiatric: She has a normal mood and affect.  Nursing note and vitals reviewed.   BP 104/70 mmHg  Pulse 57  Temp(Src) 98.7 F (37.1 C) (Oral)  Ht '5\' 2"'$  (1.575 m)  Wt 141 lb 6 oz (64.127 kg)  BMI 25.85 kg/m2  SpO2 92% Wt Readings from Last 3 Encounters:  10/23/15 141 lb 6 oz (64.127 kg)  09/25/15 143 lb (64.864 kg)  08/28/15 145 lb 9.6 oz (66.044 kg)     Lab Results  Component Value Date   WBC 9.4 02/08/2015   HGB 14.6 02/08/2015   HCT 43.0 02/08/2015   PLT 210.0 02/08/2015   GLUCOSE 81 02/08/2015   CHOL 144 02/08/2015   TRIG 330.0* 02/08/2015   HDL 30.90* 02/08/2015   LDLDIRECT 83.0 02/08/2015   LDLCALC 75 01/24/2014   ALT 13 02/08/2015   AST 18 02/08/2015   NA 141 02/08/2015   K 3.9 02/08/2015   CL 99 02/08/2015   CREATININE 1.04  02/08/2015   BUN 16 02/08/2015   CO2 34* 02/08/2015   TSH 2.91 02/08/2015   INR 1.0 10/08/2011   HGBA1C 6.1 02/08/2015    Lab Results  Component Value Date   TSH 2.91 02/08/2015   Lab Results  Component Value Date   WBC 9.4 02/08/2015   HGB 14.6 02/08/2015   HCT 43.0 02/08/2015   MCV 86.1 02/08/2015   PLT 210.0 02/08/2015   Lab Results  Component Value Date   NA 141 02/08/2015   K 3.9 02/08/2015   CO2 34*  02/08/2015   GLUCOSE 81 02/08/2015   BUN 16 02/08/2015   CREATININE 1.04 02/08/2015   BILITOT 0.7 02/08/2015   ALKPHOS 52 02/08/2015   AST 18 02/08/2015   ALT 13 02/08/2015   PROT 7.1 02/08/2015   ALBUMIN 4.2 02/08/2015   CALCIUM 9.8 02/08/2015   ANIONGAP 10 07/15/2014   GFR 54.77* 02/08/2015   Lab Results  Component Value Date   CHOL 144 02/08/2015   Lab Results  Component Value Date   HDL 30.90* 02/08/2015   Lab Results  Component Value Date   LDLCALC 75 01/24/2014   Lab Results  Component Value Date   TRIG 330.0* 02/08/2015   Lab Results  Component Value Date   CHOLHDL 5 02/08/2015   Lab Results  Component Value Date   HGBA1C 6.1 02/08/2015       Assessment & Plan:   Problem List Items Addressed This Visit    Thoracic back pain   Relevant Medications   HYDROcodone-acetaminophen (NORCO/VICODIN) 5-325 MG tablet   Other Relevant Orders   TSH   CBC   Hemoglobin A1c   Lipid panel   Comprehensive metabolic panel   PERIPHERAL VASCULAR DISEASE   Relevant Orders   TSH   CBC   Hemoglobin A1c   Lipid panel   Comprehensive metabolic panel   Osteoporosis   Relevant Orders   TSH   CBC   Hemoglobin A1c   Lipid panel   Comprehensive metabolic panel   DG Bone Density   Hyperlipidemia - Primary    Encouraged heart healthy diet, increase exercise, avoid trans fats, consider a krill oil cap daily      Relevant Orders   TSH   CBC   Hemoglobin A1c   Lipid panel   Comprehensive metabolic panel   Hyperglycemia    hgba1c acceptable, minimize simple carbs. Increase exercise as tolerated. Continue current meds      Relevant Orders   TSH   CBC   Hemoglobin A1c   Lipid panel   Comprehensive metabolic panel   Essential hypertension    Well controlled, no changes to meds. Encouraged heart healthy diet such as the DASH diet and exercise as tolerated.       Relevant Orders   TSH   CBC   Hemoglobin A1c   Lipid panel   Comprehensive metabolic panel    COPD GOLD II   Relevant Medications   HYDROcodone-homatropine (HYCODAN) 5-1.5 MG/5ML syrup   Other Relevant Orders   TSH   CBC   Hemoglobin A1c   Lipid panel   Comprehensive metabolic panel   Acute bronchitis    Encouraged increased rest and hydration, add probiotics, zinc such as Coldeze or Xicam. Treat fevers as needed. Elderberry, vitamin c and started on Doxycycline and Hydromet prn      Relevant Orders   TSH   CBC   Hemoglobin A1c   Lipid panel   Comprehensive metabolic panel  Other Visit Diagnoses    Breast cancer screening        Relevant Orders    MM Digital Screening       I have changed Ms. Anchondo HYDROcodone-acetaminophen. I am also having her start on doxycycline and HYDROcodone-homatropine. Additionally, I am having her maintain her Calcium Carbonate-Vitamin D (CALCIUM + D PO), fluticasone, acetaminophen, diclofenac sodium, Polyethyl Glycol-Propyl Glycol (SYSTANE OP), Krill Oil, mupirocin ointment, aspirin, potassium chloride, sertraline, Spacer/Aero-Holding Chambers, pantoprazole, rosuvastatin, clopidogrel, losartan, triamcinolone ointment, isosorbide mononitrate, hydrochlorothiazide, tiotropium, albuterol, budesonide-formoterol, montelukast, and nitroGLYCERIN.  Meds ordered this encounter  Medications  . doxycycline (VIBRA-TABS) 100 MG tablet    Sig: Take 1 tablet (100 mg total) by mouth 2 (two) times daily.    Dispense:  20 tablet    Refill:  0  . HYDROcodone-homatropine (HYCODAN) 5-1.5 MG/5ML syrup    Sig: Take 5 mLs by mouth every 8 (eight) hours as needed for cough.    Dispense:  120 mL    Refill:  0  . HYDROcodone-acetaminophen (NORCO/VICODIN) 5-325 MG tablet    Sig: Take 1 tablet by mouth 2 (two) times daily as needed.    Dispense:  40 tablet    Refill:  0     [name], MD

## 2015-10-23 NOTE — Patient Instructions (Signed)

## 2015-10-23 NOTE — Assessment & Plan Note (Signed)
hgba1c acceptable, minimize simple carbs. Increase exercise as tolerated. Continue current meds 

## 2015-10-24 ENCOUNTER — Ambulatory Visit: Payer: PPO

## 2015-10-26 ENCOUNTER — Ambulatory Visit: Payer: PPO

## 2015-10-29 ENCOUNTER — Ambulatory Visit: Payer: PPO

## 2015-10-29 ENCOUNTER — Other Ambulatory Visit: Payer: Self-pay | Admitting: Family Medicine

## 2015-10-29 ENCOUNTER — Ambulatory Visit (HOSPITAL_BASED_OUTPATIENT_CLINIC_OR_DEPARTMENT_OTHER)
Admission: RE | Admit: 2015-10-29 | Discharge: 2015-10-29 | Disposition: A | Discharge status: Home / Self Care | Payer: PPO | Source: Ambulatory Visit | Attending: Family Medicine | Admitting: Family Medicine

## 2015-10-29 DIAGNOSIS — M8588 Other specified disorders of bone density and structure, other site: Secondary | ICD-10-CM | POA: Diagnosis not present

## 2015-10-29 DIAGNOSIS — Z87891 Personal history of nicotine dependence: Secondary | ICD-10-CM | POA: Diagnosis not present

## 2015-10-29 DIAGNOSIS — Z780 Asymptomatic menopausal state: Secondary | ICD-10-CM | POA: Diagnosis not present

## 2015-10-29 DIAGNOSIS — M810 Age-related osteoporosis without current pathological fracture: Secondary | ICD-10-CM | POA: Insufficient documentation

## 2015-10-29 DIAGNOSIS — Z1239 Encounter for other screening for malignant neoplasm of breast: Secondary | ICD-10-CM | POA: Diagnosis not present

## 2015-10-29 DIAGNOSIS — Z1231 Encounter for screening mammogram for malignant neoplasm of breast: Secondary | ICD-10-CM | POA: Insufficient documentation

## 2015-10-29 DIAGNOSIS — M85852 Other specified disorders of bone density and structure, left thigh: Secondary | ICD-10-CM | POA: Insufficient documentation

## 2015-10-30 ENCOUNTER — Other Ambulatory Visit: Payer: Self-pay | Admitting: Family Medicine

## 2015-10-31 ENCOUNTER — Ambulatory Visit: Payer: PPO

## 2015-11-02 ENCOUNTER — Ambulatory Visit (INDEPENDENT_AMBULATORY_CARE_PROVIDER_SITE_OTHER): Payer: PPO | Admitting: Pulmonary Disease

## 2015-11-02 ENCOUNTER — Ambulatory Visit: Payer: PPO

## 2015-11-02 ENCOUNTER — Encounter: Payer: Self-pay | Admitting: Pulmonary Disease

## 2015-11-02 VITALS — BP 124/80 | HR 45 | Ht 62.0 in | Wt 138.0 lb

## 2015-11-02 DIAGNOSIS — J449 Chronic obstructive pulmonary disease, unspecified: Secondary | ICD-10-CM

## 2015-11-02 DIAGNOSIS — J441 Chronic obstructive pulmonary disease with (acute) exacerbation: Secondary | ICD-10-CM | POA: Diagnosis not present

## 2015-11-02 MED ORDER — PREDNISONE 20 MG PO TABS: 40.0000 mg | ORAL_TABLET | Freq: Every day | ORAL | Status: DC

## 2015-11-05 ENCOUNTER — Ambulatory Visit: Payer: PPO

## 2015-11-07 ENCOUNTER — Ambulatory Visit: Payer: PPO

## 2015-11-07 NOTE — Progress Notes (Signed)
PROBLEMS: COPD with significant asthmatic component  FEV1 67% pred 2014  PFTs 07/25/15: Mild-mod obstruction. FEV1 1.22 > 1.31 liters (65 > 69% predicted) Chronic cough GERD   SUBJ: Last visit we started LAMA tiotropium. She indicates that this has been very beneficial. In her words, she is "a heck of a lot better". However, last week, she developed bronchitis and is presently on doxycycline prescribed by Dr Randel Pigg. She rarely uses albuterol MDI at baseline but has been using 1-2 times daily in the past week or so. Despite doxy X several days, her increased DOE is not returning to her baseline. Denies CP, fever, hemoptysis, LE edema and calf tenderness  OBJ: Filed Vitals:   11/02/15 0959  BP: 124/80  Pulse: 45  Height: '5\' 2"'$  (1.575 m)  Weight: 138 lb (62.596 kg)  SpO2: 92%   NAD @ rest HEENT WNL No JVD Chest percussion nl, BS slightly coarse, no wheezes Reg, no M NABS No C/C/E  DATA: PFTs 07/25/15 reviewed  IMPRESSION: Chronic obstructive pulmonary disease with asthmatic component - marked improvement after tiotropium added AECOPD -    PLAN: Continue Symbicort and Spiriva  Continue Singulair @ night for now Continue albuterol inhaler (Proventil) as needed  Prednisone 40 mg daily X 5 Follow up in 3 months or PRN    Merton Border, MD PCCM service Mobile [phone] Pager [phone] 11/07/2015

## 2015-11-09 ENCOUNTER — Ambulatory Visit: Payer: PPO

## 2015-11-12 ENCOUNTER — Ambulatory Visit: Payer: PPO

## 2015-11-14 ENCOUNTER — Ambulatory Visit: Payer: PPO

## 2015-11-16 ENCOUNTER — Ambulatory Visit: Payer: PPO

## 2015-11-19 ENCOUNTER — Ambulatory Visit: Payer: PPO

## 2015-11-21 ENCOUNTER — Ambulatory Visit: Payer: PPO

## 2015-11-23 ENCOUNTER — Ambulatory Visit: Payer: PPO

## 2015-11-26 ENCOUNTER — Ambulatory Visit: Payer: PPO

## 2015-11-28 ENCOUNTER — Other Ambulatory Visit: Payer: Self-pay | Admitting: Family Medicine

## 2015-11-28 ENCOUNTER — Ambulatory Visit: Payer: PPO

## 2015-11-30 ENCOUNTER — Ambulatory Visit: Payer: PPO

## 2015-12-03 ENCOUNTER — Ambulatory Visit: Payer: PPO

## 2015-12-26 ENCOUNTER — Telehealth: Payer: Self-pay | Admitting: Cardiovascular Disease

## 2015-12-26 NOTE — Telephone Encounter (Signed)
Pt states she has angina, states her BP this morning is 204/106. Please call.  States she has not taken a Nitro this morning, the episode is gone. States she did take one yesterday.

## 2015-12-26 NOTE — Telephone Encounter (Addendum)
Patient called in stating that she has had worsening angina and had 5 episodes yesterday in which she took 4 or 5 nitro pills. She states that during these episodes her blood pressures are 180-200 over 100's and she is concerned for heart attack or stroke. She states that the chest pain goes away with nitro and that previously she had not taken nitro in a year or so. Instructed her to go to the emergency room if her chest pain returns and does not subside or call 911. Scheduled a appointment with Dr. Rockey Situ for tomorrow 12/27/15 at 3:20PM. She verbalized understanding of our conversation, agreement with plan of care, and had no further questions at this time.

## 2015-12-27 ENCOUNTER — Ambulatory Visit (INDEPENDENT_AMBULATORY_CARE_PROVIDER_SITE_OTHER): Payer: PPO | Admitting: Cardiovascular Disease

## 2015-12-27 ENCOUNTER — Ambulatory Visit
Admission: RE | Admit: 2015-12-27 | Discharge: 2015-12-27 | Disposition: A | Discharge status: Home / Self Care | Payer: PPO | Source: Ambulatory Visit | Attending: Cardiovascular Disease | Admitting: Cardiovascular Disease

## 2015-12-27 ENCOUNTER — Other Ambulatory Visit
Admission: RE | Admit: 2015-12-27 | Discharge: 2015-12-27 | Disposition: A | Discharge status: Home / Self Care | Payer: PPO | Source: Ambulatory Visit | Attending: Cardiovascular Disease | Admitting: Cardiovascular Disease

## 2015-12-27 ENCOUNTER — Encounter: Payer: Self-pay | Admitting: Cardiovascular Disease

## 2015-12-27 ENCOUNTER — Ambulatory Visit: Admission: RE | Admit: 2015-12-27 | Payer: PPO | Source: Ambulatory Visit | Admitting: Cardiovascular Disease

## 2015-12-27 VITALS — BP 130/80 | HR 54 | Ht 62.0 in | Wt 140.8 lb

## 2015-12-27 DIAGNOSIS — J209 Acute bronchitis, unspecified: Secondary | ICD-10-CM

## 2015-12-27 DIAGNOSIS — I25701 Atherosclerosis of coronary artery bypass graft(s), unspecified, with angina pectoris with documented spasm: Secondary | ICD-10-CM

## 2015-12-27 DIAGNOSIS — Z01818 Encounter for other preprocedural examination: Secondary | ICD-10-CM

## 2015-12-27 DIAGNOSIS — I701 Atherosclerosis of renal artery: Secondary | ICD-10-CM | POA: Diagnosis not present

## 2015-12-27 DIAGNOSIS — I200 Unstable angina: Secondary | ICD-10-CM

## 2015-12-27 DIAGNOSIS — Z951 Presence of aortocoronary bypass graft: Secondary | ICD-10-CM

## 2015-12-27 DIAGNOSIS — I10 Essential (primary) hypertension: Secondary | ICD-10-CM

## 2015-12-27 LAB — CBC WITH DIFFERENTIAL/PLATELET
BASOS PCT: 1 %
Basophils Absolute: 0.1 10*3/uL (ref 0–0.1)
EOS ABS: 0.2 10*3/uL (ref 0–0.7)
Eosinophils Relative: 3 %
HCT: 41.1 % (ref 35.0–47.0)
HEMOGLOBIN: 14.2 g/dL (ref 12.0–16.0)
LYMPHS ABS: 1.6 10*3/uL (ref 1.0–3.6)
Lymphocytes Relative: 26 %
MCH: 28 pg (ref 26.0–34.0)
MCHC: 34.6 g/dL (ref 32.0–36.0)
MCV: 81.1 fL (ref 80.0–100.0)
Monocytes Absolute: 0.4 10*3/uL (ref 0.2–0.9)
Monocytes Relative: 6 %
NEUTROS PCT: 64 %
Neutro Abs: 4.2 10*3/uL (ref 1.4–6.5)
Platelets: 170 10*3/uL (ref 150–440)
RBC: 5.07 MIL/uL (ref 3.80–5.20)
RDW: 16.3 % — ABNORMAL HIGH (ref 11.5–14.5)
WBC: 6.4 10*3/uL (ref 3.6–11.0)

## 2015-12-27 LAB — PROTIME-INR
INR: 0.92
PROTHROMBIN TIME: 12.3 s (ref 11.4–15.2)

## 2015-12-27 LAB — BASIC METABOLIC PANEL
ANION GAP: 5 (ref 5–15)
BUN: 16 mg/dL (ref 6–20)
CALCIUM: 9.1 mg/dL (ref 8.9–10.3)
CO2: 31 mmol/L (ref 22–32)
Chloride: 105 mmol/L (ref 101–111)
Creatinine, Ser: 0.78 mg/dL (ref 0.44–1.00)
Glucose, Bld: 97 mg/dL (ref 65–99)
Potassium: 4.1 mmol/L (ref 3.5–5.1)
SODIUM: 141 mmol/L (ref 135–145)

## 2015-12-27 MED ORDER — AZITHROMYCIN 250 MG PO TABS: ORAL_TABLET | ORAL | 0 refills | Status: DC

## 2015-12-27 NOTE — Progress Notes (Addendum)
Cardiology Office Note  Date:  12/27/2015   ID:  Andrea [name], DOB 15-Oct-1940, MRN [id number]  PCP:  [name], MD   Chief Complaint  Patient presents with  . Other    C/o chest pain. Meds reviewed verbally with pt.    HPI:  Andrea [name] is a pleasant 75 year old woman with history of chronic bradycardia,  spinal stenosis, peripheral vascular disease, status post CEA on the left ,  40-59% residual disease on the right,   hyperlipidemia,  coronary artery disease and bypass surgery in 1999, stent placement to distal RCA may 2013, who presents for routine followup  Of her coronary artery disease . Andrea [name] has underlying COPD, long smoking history for at least 30 years CABG at [facility] in [place]  Last catheterization November 2012 Atretic LIMA to the LAD,  Andrea [name] has saphenous vein graft sequential to PDA and PL loss of distal limb of vein graft to the PDA and PL with left-to-right collaterals 40-50% proximal to mid LAD with focal 60-70% mid LAD 2 large OM's, 30% mid OM1, 40-50% ostial Ejection fraction 55% with inferobasal akinesis  In follow-up today, Andrea [name] reports developing anginal symptoms May 2017 Last clinic visit that time Andrea [name] requested nitroglycerin Since that time symptoms have been getting progressively worse, Now reports having unstable Angina, frequent chest tightness, episodes of jaw pain, pain radiating across back, periodic sweats. Sometimes with exertion, sometimes at rest Sometimes with severe HTN. Andrea [name] reports recent systolic pressure 563 even in the setting of no chest discomfort.   Some SOB with exertion, even at rest Sometimes  Takingincreasing frequency of   ntg for  chest pain wants cath, feels there is a blockage Prefers to have procedure done in [place]   previously stopped her Crestor, cholesterol from 144 up to 242 in May 30. 2017  Worsening cough over the past week Had bronchitis sometime back, improved with doxycycline Reports symptoms similar to previous  bronchitis Requesting antibiotics  No regular exercise program EKG on today's visit shows normal sinus rhythm with rate 54 bpm, diffuse T-wave abnormality anterior precordial leads, inferior leads Unchanged from previous EKG   previous Lab work showing hemoglobin A1c 6.1, total cholesterol 144, LDL 83  Other past medical history Old stress test reviewed with her from 2013 at which time Andrea [name] had normal perfusion  symptoms at that time felt secondary to COPD  Has not had follow-up with pulmonary since 2014   tolerating her Crestor 40 mg daily. On this her cholesterol has significantly improved now down to within a excellent range. Chronic back pain  PMH:   has a past medical history of AAA (abdominal aortic aneurysm) ([place]) (01/2009); Acute bronchitis (03/20/2013); Angina; Anosmia; Asthma; Baker's cyst of knee (01/30/2014); Blood transfusion; Bradycardia; CAD (coronary artery disease); Cerebrovascular disease (01/2009); COPD (chronic obstructive pulmonary disease) ([place]); Depression; Dizziness; Dysrhythmia; GERD (gastroesophageal reflux disease); Herniated lumbar disc without myelopathy; Hyperglycemia (10/23/2015); Hyperlipidemia; Hypertension; Medicare annual wellness visit, subsequent (07/31/2013); NSTEMI (non-ST elevated myocardial infarction) ([place]) (11/12); Osteoporosis; PVD (peripheral vascular disease) ([place]); Shortness of breath; and Thoracic back pain (01/24/2014).  PSH:    Past Surgical History:  Procedure Laterality Date  . ABDOMINAL AORTIC ANEURYSM REPAIR    . CAROTID ENDARTERECTOMY  01/02/11   left  . CLOSED REDUCTION NASAL FRACTURE  11/2007  . CORONARY ANGIOPLASTY WITH STENT PLACEMENT  10/10/2011   drug eluting  to rc & saphenous  . CORONARY ARTERY BYPASS GRAFT  1999  . FEMORAL ARTERY STENT    . LEFT  HEART CATHETERIZATION WITH CORONARY/GRAFT ANGIOGRAM N/A 04/22/2011   Procedure: LEFT HEART CATHETERIZATION WITH Beatrix Fetters;  Surgeon: Jolaine Artist, MD;  Location: [facility] CATH  LAB;  Service: Cardiovascular;  Laterality: N/A;  . LEFT HEART CATHETERIZATION WITH CORONARY/GRAFT ANGIOGRAM N/A 10/10/2011   Procedure: LEFT HEART CATHETERIZATION WITH Beatrix Fetters;  Surgeon: Peter M Martinique, MD;  Location: [facility] CATH LAB;  Service: Cardiovascular;  Laterality: N/A;  . NASAL SINUS SURGERY     twice  . TUBAL LIGATION      Current Outpatient Prescriptions  Medication Sig Dispense Refill  . acetaminophen (TYLENOL) 650 MG CR tablet Take 1,300 mg by mouth 2 (two) times daily.    Marland Kitchen albuterol (PROVENTIL HFA;VENTOLIN HFA) 108 (90 Base) MCG/ACT inhaler Inhale 2 puffs into the lungs every 6 (six) hours as needed for wheezing. 1 Inhaler 11  . aspirin 81 MG tablet Take 81 mg by mouth daily.    . Calcium Carbonate-Vitamin D (CALCIUM + D PO) Take 1 tablet by mouth 2 (two) times daily.    . clopidogrel (PLAVIX) 75 MG tablet TAKE 1 TABLET (75 MG TOTAL) BY MOUTH DAILY. 90 tablet 3  . diclofenac sodium (VOLTAREN) 1 % GEL Apply 2 g topically 3 (three) times daily as needed (pain).    . fluticasone (FLONASE) 50 MCG/ACT nasal spray Place 2 sprays into the nose 2 (two) times daily as needed. For congestion    . hydrochlorothiazide (HYDRODIURIL) 25 MG tablet Take 1 tablet (25 mg total) by mouth every morning. 90 tablet 3  . HYDROcodone-acetaminophen (NORCO/VICODIN) 5-325 MG tablet Take 1 tablet by mouth 2 (two) times daily as needed. 40 tablet 0  . HYDROcodone-homatropine (HYCODAN) 5-1.5 MG/5ML syrup Take 5 mLs by mouth every 8 (eight) hours as needed for cough. 120 mL 0  . isosorbide mononitrate (IMDUR) 30 MG 24 hr tablet TAKE 1 TABLET (30 MG TOTAL) BY MOUTH DAILY. 90 tablet 3  . Krill Oil 1000 MG CAPS Take 1,000 mg by mouth daily.    Marland Kitchen losartan (COZAAR) 100 MG tablet TAKE 1 TABLET (100 MG TOTAL) BY MOUTH DAILY. 90 tablet 3  . montelukast (SINGULAIR) 10 MG tablet TAKE 1 TABLET BY MOUTH AT BEDTIME 30 tablet 2  . mupirocin ointment (BACTROBAN) 2 % Place 1 application into the nose daily. Via  qtip to ear daily 22 g 0  . nitroGLYCERIN (NITROSTAT) 0.4 MG SL tablet Place 1 tablet (0.4 mg total) under the tongue every 5 (five) minutes as needed for chest pain. 25 tablet 6  . pantoprazole (PROTONIX) 40 MG tablet TAKE 1 TABLET (40 MG TOTAL) BY MOUTH DAILY. 90 tablet 3  . Polyethyl Glycol-Propyl Glycol (SYSTANE OP) Place 1 drop into both eyes daily as needed (dry eyes).    . potassium chloride (K-DUR) 10 MEQ tablet Take 1 tablet (10 mEq total) by mouth daily as needed. 90 tablet 3  . rosuvastatin (CRESTOR) 40 MG tablet Take 1 tablet (40 mg total) by mouth daily. 90 tablet 3  . sertraline (ZOLOFT) 50 MG tablet TAKE 1 TABLET EVERY DAY **NEED OFFICE VISIT 90 tablet 1  . Spacer/Aero-Holding Chambers DEVI 1 spacer to use prn with HFA device as directed 1 each 0  . SYMBICORT 160-4.5 MCG/ACT inhaler INHALE 2 PUFFS INTO THE LUNGS 2 (TWO) TIMES DAILY. 10.2 Inhaler 4  . tiotropium (SPIRIVA) 18 MCG inhalation capsule Place 1 capsule (18 mcg total) into inhaler and inhale daily. 30 capsule 11  . triamcinolone ointment (KENALOG) 0.5 % Apply 1 application topically 2 (  two) times daily. 30 g 2  . azithromycin (ZITHROMAX) 250 MG tablet Take daily 6 each 0   No current facility-administered medications for this visit.      Allergies:   Ciprofloxacin; Codeine; Erythromycin; Meperidine hcl; Penicillins; Shellfish allergy; and Tape   Social History:  The patient  reports that Andrea [name] quit smoking about 22 years ago. Her smoking use included Cigarettes. Andrea [name] has a 20.00 pack-year smoking history. Andrea [name] has never used smokeless tobacco. Andrea [name] reports that Andrea [name] does not drink alcohol or use drugs.   Family History:   family history includes Alcohol abuse in her brother; Aneurysm in her brother; Cancer in her father and sister; Cirrhosis in her maternal grandfather; Colon cancer (age of onset: 97) in her father; Diabetes in her maternal grandmother and mother; Heart attack in her brother and brother; Heart attack (age of  onset: 43) in her mother; Heart disease in her brother, brother, and father; Hyperlipidemia in her daughter; Hypertension in her brother and father; Hypothyroidism in her sister; Lung cancer in her father and sister; Obesity in her daughter and son; Stroke in her maternal grandmother and paternal grandmother.    Review of Systems: Review of Systems  Constitutional: Negative.   Respiratory: Negative.   Cardiovascular: Positive for chest pain.       Chest tightness, jaw pain, pain across her back  Gastrointestinal: Negative.   Musculoskeletal: Negative.   Neurological: Negative.   Psychiatric/Behavioral: Negative.   All other systems reviewed and are negative.    PHYSICAL EXAM: VS:  BP 130/80 (BP Location: Left Arm, Patient Position: Sitting, Cuff Size: Normal)   Pulse (!) 54   Ht '5\' 2"'$  (1.575 m)   Wt 140 lb 12 oz (63.8 kg)   BMI 25.74 kg/m  , BMI Body mass index is 25.74 kg/m. GEN: Well nourished, well developed, in no acute distress  HEENT: normal  Neck: no JVD, carotid bruits, or masses Cardiac: RRR; no murmurs, rubs, or gallops,no edema  Respiratory:  clear to auscultation bilaterally, normal work of breathing GI: soft, nontender, nondistended, + BS MS: no deformity or atrophy  Skin: warm and dry, no rash Neuro:  Strength and sensation are intact Psych: euthymic mood, full affect    Recent Labs: 10/23/2015: ALT 9; BUN 14; Creatinine, Ser 0.90; Hemoglobin 13.3; Platelets 174.0; Potassium 3.9; Sodium 141; TSH 3.09    Lipid Panel Lab Results  Component Value Date   CHOL 242 (H) 10/23/2015   HDL 33.50 (L) 10/23/2015   LDLCALC 180 (H) 10/23/2015   TRIG 145.0 10/23/2015      Wt Readings from Last 3 Encounters:  12/27/15 140 lb 12 oz (63.8 kg)  11/02/15 138 lb (62.6 kg)  10/23/15 141 lb 6 oz (64.1 kg)       ASSESSMENT AND PLAN:  Pre-procedural examination - Plan: Basic Metabolic Panel (BMET), CBC with Differential/Platelet, INR/PT, DG Chest 2 View Lab work,  chest x-ray ordered in preparation for cardiac catheterization next week  Unstable angina ([place]) -  Long discussion concerning her worsening chest pain since May 2017 As detailed above, symptoms concerning for unstable angina Given the unstable nature her symptoms, will recommend cardiac catheterization Andrea [name] prefers to have this done in [place], [place] schedule for August 9 in the morning with Dr. Jacky Kindle place orders in preparation Risk and benefits discussed with the patient including risk of stroke and heart attack Andrea [name] understands the risk and willing to proceed  Carotid stenosis 40-59% disease on the right, status post carotid  endarterectomy on the left Last visualized in September 2015, no change from 2013  Essential hypertension Blood pressure has been elevated at home but well controlled on today's visit Suggested Andrea [name] increase isosorbide to 30 mg twice a day for anginal symptoms and blood pressure  Coronary artery disease involving coronary bypass graft of native heart with angina pectoris with documented spasm ([place])  Hx of CABG Catheterization in 2013 done by Dr. Pierre Bali in [place] Report not in the computer We will request a copy of the report  Acute bronchitis Symptoms for the past week, Thick cough on exam Previously treated with doxycycline but Andrea [name] prefers to try Z-Pak Prescription provided today Recommended Andrea [name] call if symptoms do not start to improve    Total encounter time more than 40 minutes  Greater than 50% was spent in counseling and coordination of care with the patient   Disposition:   F/U  1 month   Orders Placed This Encounter  Procedures  . DG Chest 2 View  . Basic Metabolic Panel (BMET)  . CBC with Differential/Platelet  . INR/PT  . EKG 12-Lead     Signed, Esmond Plants, M.D., Ph.D. 12/27/2015  [place], [place]

## 2015-12-27 NOTE — Patient Instructions (Addendum)
Medication Instructions:  Please start Z-pak two the first day then one a day  Increase the imdur up to twice a day for angina  Labwork: We will order labs today for cardiac cath Please take orders to the [place] entrance of [facility]  Testing/Procedures: Cardiac cath next Wednesday Cxray today - please take orders to the [place] entrance of [place]: It was a pleasure seeing you in the office today. Please call us if you have new issues that need to be addressed before your next appt.  [phone]  Your physician wants you to follow-up in: 1 month   If you need a refill on your cardiac medications before your next appointment, please call your pharmacy.  [facility] Cardiac Cath Instructions   You are scheduled for a Cardiac Cath on:___Wednesday, August 9__  Please arrive at 6:30 am on the day of your procedure  Please expect a call from our [place] to pre-register you  Do not eat/drink anything after midnight  Someone will need to drive you home  It is recommended someone be with you for the first 24 hours after your procedure  Wear clothes that are easy to get on/off and wear slip on shoes if possible  Medications bring a current list of all medications with you  __X_ You may take all of your medications the morning of your procedure with enough water to swallow safely   Day of your procedure: Please arrive at Entrance "A" - Short Stay at [facility]  The usual length of stay after your procedure is about 2 to 3 hours.  This can vary.  If you have any questions, please call our office at [phone], or you may call the cardiac cath lab at [facility] directly at [phone].   Angiogram An angiogram, also called angiography, is a procedure used to look at the blood vessels. In this procedure, dye is injected through a long, thin tube (catheter) into an artery. X-rays are then taken. The X-rays will show if there is a blockage  or problem in a blood vessel.  LET [facility] CARE PROVIDER KNOW ABOUT:  Any allergies you have, including allergies to shellfish or contrast dye.   All medicines you are taking, including vitamins, herbs, eye drops, creams, and over-the-counter medicines.   Previous problems you or members of your family have had with the use of anesthetics.   Any blood disorders you have.   Previous surgeries you have had.  Any previous kidney problems or failure you have had.  Medical conditions you have.   Possibility of pregnancy, if this applies. RISKS AND COMPLICATIONS Generally, an angiogram is a safe procedure. However, as with any procedure, problems can occur. Possible problems include:  Injury to the blood vessels, including rupture or bleeding.  Infection or bruising at the catheter site.  Allergic reaction to the dye or contrast used.  Kidney damage from the dye or contrast used.  Blood clots that can lead to a stroke or heart attack. BEFORE THE PROCEDURE  Do not eat or drink after midnight on the night before the procedure, or as directed by your health care provider.   Ask your health care provider if you may drink enough water to take any needed medicines the morning of the procedure.  PROCEDURE  You may be given a medicine to help you relax (sedative) before and during the procedure. This medicine is given through an IV access tube that is inserted into  one of your veins.   The area where the catheter will be inserted will be washed and shaved. This is usually done in the groin but may be done in the fold of your arm (near your elbow) or in the wrist.  A medicine will be given to numb the area where the catheter will be inserted (local anesthetic).  The catheter will be inserted with a guide wire into an artery. The catheter is guided by using a type of X-ray (fluoroscopy) to the blood vessel being examined.   Dye is then injected into the catheter, and X-rays  are taken. The dye helps to show where any narrowing or blockages are located.  AFTER THE PROCEDURE   If the procedure is done through the leg, you will be kept in bed lying flat for several hours. You will be instructed to not bend or cross your legs.  The insertion site will be checked frequently.  The pulse in your feet or wrist will be checked frequently.  Additional blood tests, X-rays, and electrocardiography may be done.   You may need to stay in the hospital overnight for observation.    This information is not intended to replace advice given to you by your health care provider. Make sure you discuss any questions you have with your health care provider.   Document Released: 02/19/2005 Document Revised: 06/02/2014 Document Reviewed: 10/13/2012 Elsevier Interactive Patient Education 2016 [place] After Refer to this sheet in the next few weeks. These instructions provide you with information about caring for yourself after your procedure. Your health care provider may also give you more specific instructions. Your treatment has been planned according to current medical practices, but problems sometimes occur. Call your health care provider if you have any problems or questions after your procedure. WHAT TO EXPECT AFTER THE PROCEDURE After your procedure, it is typical to have the following:  Bruising at the catheter insertion site that usually fades within 1-2 weeks.  Blood collecting in the tissue (hematoma) that may be painful to the touch. It should usually decrease in size and tenderness within 1-2 weeks. HOME CARE INSTRUCTIONS  Take medicines only as directed by your health care provider.  You may shower 24-48 hours after the procedure or as directed by your health care provider. Remove the bandage (dressing) and gently wash the site with plain soap and water. Pat the area dry with a clean towel. Do not rub the site, because this may cause  bleeding.  Do not take baths, swim, or use a hot tub until your health care provider approves.  Check your insertion site every day for redness, swelling, or drainage.  Do not apply powder or lotion to the site.  Do not lift over 10 lb (4.5 kg) for 5 days after your procedure or as directed by your health care provider.  Ask your health care provider when it is okay to:  Return to work or school.  Resume usual physical activities or sports.  Resume sexual activity.  Do not drive home if you are discharged the same day as the procedure. Have someone else drive you.  You may drive 24 hours after the procedure unless otherwise instructed by your health care provider.  Do not operate machinery or power tools for 24 hours after the procedure or as directed by your health care provider.  If your procedure was done as an outpatient procedure, which means that you went home the same day as your procedure,  a responsible adult should be with you for the first 24 hours after you arrive home.  Keep all follow-up visits as directed by your health care provider. This is important. SEEK MEDICAL CARE IF:  You have a fever.  You have chills.  You have increased bleeding from the catheter insertion site. Hold pressure on the site. SEEK IMMEDIATE MEDICAL CARE IF:  You have unusual pain at the catheter insertion site.  You have redness, warmth, or swelling at the catheter insertion site.  You have drainage (other than a small amount of blood on the dressing) from the catheter insertion site.  The catheter insertion site is bleeding, and the bleeding does not stop after 30 minutes of holding steady pressure on the site.  The area near or just beyond the catheter insertion site becomes pale, cool, tingly, or numb.   This information is not intended to replace advice given to you by your health care provider. Make sure you discuss any questions you have with your health care provider.    Document Released: 11/28/2004 Document Revised: 06/02/2014 Document Reviewed: 10/13/2012 Elsevier Interactive Patient Education [employer].

## 2015-12-28 ENCOUNTER — Ambulatory Visit
Admission: RE | Admit: 2015-12-28 | Discharge: 2015-12-28 | Disposition: A | Discharge status: Home / Self Care | Payer: PPO | Source: Ambulatory Visit | Attending: Cardiovascular Disease | Admitting: Cardiovascular Disease

## 2015-12-28 ENCOUNTER — Ambulatory Visit: Payer: PPO | Admitting: Cardiovascular Disease

## 2015-12-28 ENCOUNTER — Other Ambulatory Visit: Payer: Self-pay

## 2015-12-28 DIAGNOSIS — R079 Chest pain, unspecified: Secondary | ICD-10-CM

## 2015-12-28 DIAGNOSIS — I200 Unstable angina: Secondary | ICD-10-CM | POA: Insufficient documentation

## 2015-12-28 DIAGNOSIS — Z01810 Encounter for preprocedural cardiovascular examination: Secondary | ICD-10-CM | POA: Diagnosis not present

## 2015-12-28 DIAGNOSIS — Z01818 Encounter for other preprocedural examination: Secondary | ICD-10-CM

## 2016-01-01 ENCOUNTER — Telehealth: Payer: Self-pay | Admitting: Cardiovascular Disease

## 2016-01-01 NOTE — Telephone Encounter (Signed)
Reviewed cath instructions w/pt who verbalized understanding. She had no questions at this time.

## 2016-01-02 ENCOUNTER — Encounter (HOSPITAL_COMMUNITY)
Admission: RE | Disposition: A | Discharge status: Home / Self Care | Payer: Self-pay | Source: Ambulatory Visit | Attending: Cardiovascular Disease

## 2016-01-02 ENCOUNTER — Encounter (HOSPITAL_COMMUNITY): Payer: Self-pay | Admitting: Cardiovascular Disease

## 2016-01-02 ENCOUNTER — Ambulatory Visit (HOSPITAL_COMMUNITY)
Admission: RE | Admit: 2016-01-02 | Discharge: 2016-01-03 | Disposition: A | Discharge status: Home / Self Care | Payer: PPO | Source: Ambulatory Visit | Attending: Cardiovascular Disease | Admitting: Cardiovascular Disease

## 2016-01-02 DIAGNOSIS — Z951 Presence of aortocoronary bypass graft: Secondary | ICD-10-CM

## 2016-01-02 DIAGNOSIS — I2584 Coronary atherosclerosis due to calcified coronary lesion: Secondary | ICD-10-CM | POA: Insufficient documentation

## 2016-01-02 DIAGNOSIS — M549 Dorsalgia, unspecified: Secondary | ICD-10-CM | POA: Diagnosis not present

## 2016-01-02 DIAGNOSIS — K219 Gastro-esophageal reflux disease without esophagitis: Secondary | ICD-10-CM | POA: Diagnosis not present

## 2016-01-02 DIAGNOSIS — I739 Peripheral vascular disease, unspecified: Secondary | ICD-10-CM | POA: Insufficient documentation

## 2016-01-02 DIAGNOSIS — I25718 Atherosclerosis of autologous vein coronary artery bypass graft(s) with other forms of angina pectoris: Secondary | ICD-10-CM

## 2016-01-02 DIAGNOSIS — Z7982 Long term (current) use of aspirin: Secondary | ICD-10-CM | POA: Insufficient documentation

## 2016-01-02 DIAGNOSIS — I25118 Atherosclerotic heart disease of native coronary artery with other forms of angina pectoris: Secondary | ICD-10-CM

## 2016-01-02 DIAGNOSIS — I10 Essential (primary) hypertension: Secondary | ICD-10-CM | POA: Diagnosis not present

## 2016-01-02 DIAGNOSIS — Z880 Allergy status to penicillin: Secondary | ICD-10-CM | POA: Diagnosis not present

## 2016-01-02 DIAGNOSIS — I214 Non-ST elevation (NSTEMI) myocardial infarction: Secondary | ICD-10-CM

## 2016-01-02 DIAGNOSIS — Z955 Presence of coronary angioplasty implant and graft: Secondary | ICD-10-CM

## 2016-01-02 DIAGNOSIS — I25110 Atherosclerotic heart disease of native coronary artery with unstable angina pectoris: Secondary | ICD-10-CM | POA: Insufficient documentation

## 2016-01-02 DIAGNOSIS — Z87891 Personal history of nicotine dependence: Secondary | ICD-10-CM | POA: Insufficient documentation

## 2016-01-02 DIAGNOSIS — I25710 Atherosclerosis of autologous vein coronary artery bypass graft(s) with unstable angina pectoris: Secondary | ICD-10-CM | POA: Insufficient documentation

## 2016-01-02 DIAGNOSIS — J440 Chronic obstructive pulmonary disease with acute lower respiratory infection: Secondary | ICD-10-CM | POA: Insufficient documentation

## 2016-01-02 DIAGNOSIS — Z7902 Long term (current) use of antithrombotics/antiplatelets: Secondary | ICD-10-CM | POA: Diagnosis not present

## 2016-01-02 DIAGNOSIS — I209 Angina pectoris, unspecified: Secondary | ICD-10-CM

## 2016-01-02 DIAGNOSIS — E785 Hyperlipidemia, unspecified: Secondary | ICD-10-CM | POA: Diagnosis present

## 2016-01-02 DIAGNOSIS — I252 Old myocardial infarction: Secondary | ICD-10-CM | POA: Insufficient documentation

## 2016-01-02 DIAGNOSIS — I25119 Atherosclerotic heart disease of native coronary artery with unspecified angina pectoris: Secondary | ICD-10-CM | POA: Diagnosis not present

## 2016-01-02 DIAGNOSIS — I2089 Other forms of angina pectoris: Secondary | ICD-10-CM | POA: Diagnosis present

## 2016-01-02 DIAGNOSIS — I2510 Atherosclerotic heart disease of native coronary artery without angina pectoris: Secondary | ICD-10-CM | POA: Diagnosis present

## 2016-01-02 DIAGNOSIS — I208 Other forms of angina pectoris: Secondary | ICD-10-CM | POA: Diagnosis present

## 2016-01-02 HISTORY — DX: Cardiac murmur, unspecified: R01.1

## 2016-01-02 HISTORY — DX: Other pulmonary embolism without acute cor pulmonale: I26.99

## 2016-01-02 HISTORY — DX: Spondylosis, unspecified: M47.9

## 2016-01-02 HISTORY — DX: Reserved for concepts with insufficient information to code with codable children: IMO0002

## 2016-01-02 HISTORY — DX: Basal cell carcinoma of skin, unspecified: C44.91

## 2016-01-02 HISTORY — PX: CARDIAC CATHETERIZATION: SHX172

## 2016-01-02 HISTORY — DX: Personal history of other medical treatment: Z92.89

## 2016-01-02 HISTORY — DX: Spondylosis without myelopathy or radiculopathy, thoracolumbar region: M47.815

## 2016-01-02 HISTORY — DX: Pneumonia, unspecified organism: J18.9

## 2016-01-02 HISTORY — DX: Pain in thoracic spine: M54.6

## 2016-01-02 HISTORY — DX: Other chronic pain: G89.29

## 2016-01-02 LAB — TROPONIN I: Troponin I: <0.03 ng/mL (ref <0.03)

## 2016-01-02 LAB — POCT ACTIVATED CLOTTING TIME: ACTIVATED CLOTTING TIME: 488 s

## 2016-01-02 SURGERY — LEFT HEART CATH AND CORONARY ANGIOGRAPHY
Anesthesia: LOCAL

## 2016-01-02 MED ORDER — SERTRALINE HCL 50 MG PO TABS
50.0000 mg | ORAL_TABLET | Freq: Every day | ORAL | Status: DC
  Administered 2016-01-02 – 2016-01-03 (×2): 50 mg via ORAL
  Filled 2016-01-02 (×2): qty 1

## 2016-01-02 MED ORDER — LIDOCAINE HCL (PF) 1 % IJ SOLN
INTRAMUSCULAR | Status: DC | PRN
  Administered 2016-01-02: 4 mL

## 2016-01-02 MED ORDER — ASPIRIN 81 MG PO CHEW
CHEWABLE_TABLET | ORAL | Status: AC
Start: 2016-01-02 — End: 2016-01-02
  Administered 2016-01-02: 81 mg via ORAL
  Filled 2016-01-02: qty 1

## 2016-01-02 MED ORDER — HEPARIN (PORCINE) IN NACL 2-0.9 UNIT/ML-% IJ SOLN
INTRAMUSCULAR | Status: AC
  Filled 2016-01-02: qty 500

## 2016-01-02 MED ORDER — ASPIRIN 81 MG PO CHEW
81.0000 mg | CHEWABLE_TABLET | ORAL | Status: AC
  Administered 2016-01-02: 81 mg via ORAL

## 2016-01-02 MED ORDER — CLOPIDOGREL BISULFATE 75 MG PO TABS
75.0000 mg | ORAL_TABLET | Freq: Every day | ORAL | Status: DC
  Administered 2016-01-03: 75 mg via ORAL
  Filled 2016-01-02: qty 1

## 2016-01-02 MED ORDER — LOSARTAN POTASSIUM 50 MG PO TABS
100.0000 mg | ORAL_TABLET | Freq: Every day | ORAL | Status: DC
  Administered 2016-01-02 – 2016-01-03 (×2): 100 mg via ORAL
  Filled 2016-01-02 (×2): qty 2

## 2016-01-02 MED ORDER — ACETAMINOPHEN 325 MG PO TABS
1000.0000 mg | ORAL_TABLET | Freq: Two times a day (BID) | ORAL | Status: DC | PRN
  Administered 2016-01-03: 11:00:00 975 mg via ORAL
  Filled 2016-01-02: qty 3

## 2016-01-02 MED ORDER — FENTANYL CITRATE (PF) 100 MCG/2ML IJ SOLN
INTRAMUSCULAR | Status: AC
  Filled 2016-01-02: qty 2

## 2016-01-02 MED ORDER — KRILL OIL 1000 MG PO CAPS: 1000.0000 mg | ORAL_CAPSULE | Freq: Every day | ORAL | Status: DC

## 2016-01-02 MED ORDER — SODIUM CHLORIDE 0.9% FLUSH
3.0000 mL | Freq: Two times a day (BID) | INTRAVENOUS | Status: DC
Start: 2016-01-02 — End: 2016-01-03
  Administered 2016-01-02 (×2): 3 mL via INTRAVENOUS

## 2016-01-02 MED ORDER — MONTELUKAST SODIUM 10 MG PO TABS
10.0000 mg | ORAL_TABLET | Freq: Every day | ORAL | Status: DC
  Administered 2016-01-02: 10 mg via ORAL
  Filled 2016-01-02: qty 1

## 2016-01-02 MED ORDER — MOMETASONE FURO-FORMOTEROL FUM 200-5 MCG/ACT IN AERO
2.0000 | INHALATION_SPRAY | Freq: Two times a day (BID) | RESPIRATORY_TRACT | Status: DC
  Administered 2016-01-02 – 2016-01-03 (×2): 2 via RESPIRATORY_TRACT
  Filled 2016-01-02: qty 8.8

## 2016-01-02 MED ORDER — SODIUM CHLORIDE 0.9 % IV SOLN: 250.0000 mL | INTRAVENOUS | Status: DC | PRN

## 2016-01-02 MED ORDER — HYDROCHLOROTHIAZIDE 25 MG PO TABS
25.0000 mg | ORAL_TABLET | ORAL | Status: DC
  Administered 2016-01-03: 25 mg via ORAL
  Filled 2016-01-02: qty 1

## 2016-01-02 MED ORDER — ONDANSETRON HCL 4 MG/2ML IJ SOLN: 4.0000 mg | Freq: Four times a day (QID) | INTRAMUSCULAR | Status: DC | PRN

## 2016-01-02 MED ORDER — NITROGLYCERIN 0.4 MG SL SUBL: 0.4000 mg | SUBLINGUAL_TABLET | SUBLINGUAL | Status: DC | PRN

## 2016-01-02 MED ORDER — VERAPAMIL HCL 2.5 MG/ML IV SOLN
INTRAVENOUS | Status: DC | PRN
  Administered 2016-01-02: 09:00:00 via INTRA_ARTERIAL

## 2016-01-02 MED ORDER — IOPAMIDOL (ISOVUE-370) INJECTION 76%
INTRAVENOUS | Status: AC
  Filled 2016-01-02: qty 125

## 2016-01-02 MED ORDER — TIOTROPIUM BROMIDE MONOHYDRATE 18 MCG IN CAPS
18.0000 ug | ORAL_CAPSULE | Freq: Every day | RESPIRATORY_TRACT | Status: DC
  Administered 2016-01-03: 18 ug via RESPIRATORY_TRACT
  Filled 2016-01-02: qty 5

## 2016-01-02 MED ORDER — ZOLPIDEM TARTRATE 5 MG PO TABS
5.0000 mg | ORAL_TABLET | Freq: Every evening | ORAL | Status: DC | PRN
  Administered 2016-01-02: 5 mg via ORAL
  Filled 2016-01-02: qty 1

## 2016-01-02 MED ORDER — MIDAZOLAM HCL 2 MG/2ML IJ SOLN
INTRAMUSCULAR | Status: AC
  Filled 2016-01-02: qty 2

## 2016-01-02 MED ORDER — LIDOCAINE HCL (PF) 1 % IJ SOLN
INTRAMUSCULAR | Status: AC
  Filled 2016-01-02: qty 30

## 2016-01-02 MED ORDER — BIVALIRUDIN BOLUS VIA INFUSION - CUPID
INTRAVENOUS | Status: DC | PRN
  Administered 2016-01-02: 47.85 mg via INTRAVENOUS

## 2016-01-02 MED ORDER — SODIUM CHLORIDE 0.9% FLUSH
3.0000 mL | Freq: Two times a day (BID) | INTRAVENOUS | Status: DC
Start: 2016-01-02 — End: 2016-01-02

## 2016-01-02 MED ORDER — SODIUM CHLORIDE 0.9 % WEIGHT BASED INFUSION: 1.0000 mL/kg/h | INTRAVENOUS | Status: DC

## 2016-01-02 MED ORDER — ASPIRIN 81 MG PO CHEW
81.0000 mg | CHEWABLE_TABLET | Freq: Every day | ORAL | Status: DC
  Administered 2016-01-03: 11:00:00 81 mg via ORAL
  Filled 2016-01-02: qty 1

## 2016-01-02 MED ORDER — SODIUM CHLORIDE 0.9 % WEIGHT BASED INFUSION
3.0000 mL/kg/h | INTRAVENOUS | Status: DC
Start: 2016-01-02 — End: 2016-01-02
  Administered 2016-01-02: 3 mL/kg/h via INTRAVENOUS

## 2016-01-02 MED ORDER — FLUTICASONE PROPIONATE 50 MCG/ACT NA SUSP
2.0000 | Freq: Two times a day (BID) | NASAL | Status: DC | PRN
  Filled 2016-01-02: qty 16

## 2016-01-02 MED ORDER — CLOPIDOGREL BISULFATE 300 MG PO TABS
ORAL_TABLET | ORAL | Status: AC
  Filled 2016-01-02: qty 1

## 2016-01-02 MED ORDER — BIVALIRUDIN 250 MG IV SOLR
INTRAVENOUS | Status: AC
  Filled 2016-01-02: qty 250

## 2016-01-02 MED ORDER — SODIUM CHLORIDE 0.9 % IV SOLN: INTRAVENOUS | Status: DC

## 2016-01-02 MED ORDER — IOPAMIDOL (ISOVUE-370) INJECTION 76%
INTRAVENOUS | Status: AC
  Filled 2016-01-02: qty 50

## 2016-01-02 MED ORDER — SODIUM CHLORIDE 0.9% FLUSH: 3.0000 mL | INTRAVENOUS | Status: DC | PRN

## 2016-01-02 MED ORDER — NITROGLYCERIN 1 MG/10 ML FOR IR/CATH LAB
INTRA_ARTERIAL | Status: AC
  Filled 2016-01-02: qty 10

## 2016-01-02 MED ORDER — NITROGLYCERIN 1 MG/10 ML FOR IR/CATH LAB
INTRA_ARTERIAL | Status: DC | PRN
  Administered 2016-01-02 (×2): 200 ug via INTRACORONARY

## 2016-01-02 MED ORDER — HYDROCODONE-ACETAMINOPHEN 5-325 MG PO TABS: 1.0000 | ORAL_TABLET | Freq: Two times a day (BID) | ORAL | Status: DC | PRN

## 2016-01-02 MED ORDER — HEPARIN SODIUM (PORCINE) 1000 UNIT/ML IJ SOLN
INTRAMUSCULAR | Status: DC | PRN
  Administered 2016-01-02: 3000 [IU] via INTRAVENOUS

## 2016-01-02 MED ORDER — FENTANYL CITRATE (PF) 100 MCG/2ML IJ SOLN
INTRAMUSCULAR | Status: DC | PRN
  Administered 2016-01-02 (×2): 25 ug via INTRAVENOUS
  Administered 2016-01-02: 50 ug via INTRAVENOUS

## 2016-01-02 MED ORDER — ALBUTEROL SULFATE (2.5 MG/3ML) 0.083% IN NEBU: 3.0000 mL | INHALATION_SOLUTION | Freq: Four times a day (QID) | RESPIRATORY_TRACT | Status: DC | PRN

## 2016-01-02 MED ORDER — SODIUM CHLORIDE 0.9 % IV SOLN
INTRAVENOUS | Status: DC | PRN
  Administered 2016-01-02: 1.75 mg/kg/h via INTRAVENOUS

## 2016-01-02 MED ORDER — ACETAMINOPHEN 325 MG PO TABS
650.0000 mg | ORAL_TABLET | ORAL | Status: DC | PRN
  Administered 2016-01-02: 650 mg via ORAL
  Filled 2016-01-02: qty 2

## 2016-01-02 MED ORDER — ROSUVASTATIN CALCIUM 20 MG PO TABS
40.0000 mg | ORAL_TABLET | Freq: Every day | ORAL | Status: DC
  Administered 2016-01-02 – 2016-01-03 (×2): 40 mg via ORAL
  Filled 2016-01-02 (×2): qty 2

## 2016-01-02 MED ORDER — PANTOPRAZOLE SODIUM 40 MG PO TBEC
40.0000 mg | DELAYED_RELEASE_TABLET | Freq: Every day | ORAL | Status: DC
  Administered 2016-01-03: 11:00:00 40 mg via ORAL
  Filled 2016-01-02 (×2): qty 1

## 2016-01-02 MED ORDER — VERAPAMIL HCL 2.5 MG/ML IV SOLN
INTRAVENOUS | Status: AC
  Filled 2016-01-02: qty 2

## 2016-01-02 MED ORDER — CLOPIDOGREL BISULFATE 300 MG PO TABS
ORAL_TABLET | ORAL | Status: DC | PRN
  Administered 2016-01-02: 300 mg via ORAL

## 2016-01-02 MED ORDER — ISOSORBIDE MONONITRATE ER 30 MG PO TB24
30.0000 mg | ORAL_TABLET | Freq: Every day | ORAL | Status: DC
  Administered 2016-01-03: 11:00:00 30 mg via ORAL
  Filled 2016-01-02 (×2): qty 1

## 2016-01-02 MED ORDER — MIDAZOLAM HCL 2 MG/2ML IJ SOLN
INTRAMUSCULAR | Status: DC | PRN
  Administered 2016-01-02 (×2): 1 mg via INTRAVENOUS

## 2016-01-02 SURGICAL SUPPLY — 22 items
BALLN EMERGE MR 2.5X12 (BALLOONS) ×2
BALLN ~~LOC~~ EMERGE MR 3.25X15 (BALLOONS) ×4
BALLOON EMERGE MR 2.5X12 (BALLOONS) ×1 IMPLANT
BALLOON ~~LOC~~ EMERGE MR 3.25X15 (BALLOONS) ×2 IMPLANT
CATH INFINITI 5 FR IM (CATHETERS) ×2 IMPLANT
CATH INFINITI 5FR ANG PIGTAIL (CATHETERS) ×2 IMPLANT
CATH OPTITORQUE TIG 4.0 5F (CATHETERS) ×2 IMPLANT
CATH VISTA GUIDE 6FR MPA1 (CATHETERS) ×2 IMPLANT
CATH VISTA GUIDE 6FR XB3 (CATHETERS) ×2 IMPLANT
CATH VISTA GUIDE 6FR XB3.5 (CATHETERS) ×2 IMPLANT
DEVICE RAD COMP TR BAND LRG (VASCULAR PRODUCTS) ×2 IMPLANT
GLIDESHEATH SLEND SS 6F .021 (SHEATH) ×2 IMPLANT
KIT ENCORE 26 ADVANTAGE (KITS) ×2 IMPLANT
KIT HEART LEFT (KITS) ×2 IMPLANT
PACK CARDIAC CATHETERIZATION (CUSTOM PROCEDURE TRAY) ×2 IMPLANT
STOPCOCK MORSE 400PSI 3WAY (MISCELLANEOUS) ×2 IMPLANT
SYR MEDRAD MARK V 150ML (SYRINGE) ×2 IMPLANT
TRANSDUCER W/STOPCOCK (MISCELLANEOUS) ×2 IMPLANT
TUBING CIL FLEX 10 FLL-RA (TUBING) ×4 IMPLANT
WIRE HITORQ VERSACORE ST 145CM (WIRE) ×2 IMPLANT
WIRE RUNTHROUGH .014X180CM (WIRE) ×2 IMPLANT
WIRE SAFE-T 1.5MM-J .035X260CM (WIRE) ×2 IMPLANT

## 2016-01-02 NOTE — Interval H&P Note (Signed)
Cath Lab Visit (complete for each Cath Lab visit)  Clinical Evaluation Leading to the Procedure:   ACS: No.  Non-ACS:    Anginal Classification: CCS IV  Anti-ischemic medical therapy: Minimal Therapy (1 class of medications)  Non-Invasive Test Results: No non-invasive testing performed  Prior CABG: Previous CABG      History and Physical Interval Note:  01/02/2016 8:29 AM  Andrea [name]  has presented today for surgery, with the diagnosis of chest pain  The various methods of treatment have been discussed with the patient and family. After consideration of risks, benefits and other options for treatment, the patient has consented to  Procedure(s): Left Heart Cath and Coronary Angiography (N/A) as a surgical intervention .  The patient's history has been reviewed, patient examined, no change in status, stable for surgery.  I have reviewed the patient's chart and labs.  Questions were answered to the patient's satisfaction.     Kathlyn [place]

## 2016-01-02 NOTE — H&P (View-Only) (Signed)
Cardiology Office Note  Date:  12/27/2015   ID:  Andrea [name], DOB Nov 09, 1940, MRN [id number]  PCP:  [name], MD   Chief Complaint  Patient presents with  . Other    C/o chest pain. Meds reviewed verbally with pt.    HPI:  Andrea [name] is a pleasant 75 year old woman with history of chronic bradycardia,  spinal stenosis, peripheral vascular disease, status post CEA on the left ,  40-59% residual disease on the right,   hyperlipidemia,  coronary artery disease and bypass surgery in 1999, stent placement to distal RCA may 2013, who presents for routine followup  Of her coronary artery disease . She has underlying COPD, long smoking history for at least 30 years CABG at [facility] in [place]  Last catheterization November 2012 Atretic LIMA to the LAD,  She has saphenous vein graft sequential to PDA and PL loss of distal limb of vein graft to the PDA and PL with left-to-right collaterals 40-50% proximal to mid LAD with focal 60-70% mid LAD 2 large OM's, 30% mid OM1, 40-50% ostial Ejection fraction 55% with inferobasal akinesis  In follow-up today, she reports developing anginal symptoms May 2017 Last clinic visit that time she requested nitroglycerin Since that time symptoms have been getting progressively worse, Now reports having unstable Angina, frequent chest tightness, episodes of jaw pain, pain radiating across back, periodic sweats. Sometimes with exertion, sometimes at rest Sometimes with severe HTN. She reports recent systolic pressure 341 even in the setting of no chest discomfort.   Some SOB with exertion, even at rest Sometimes  Takingincreasing frequency of   ntg for  chest pain wants cath, feels there is a blockage Prefers to have procedure done in [place]   previously stopped her Crestor, cholesterol from 144 up to 242 in May 30. 2017  Worsening cough over the past week Had bronchitis sometime back, improved with doxycycline Reports symptoms similar to previous  bronchitis Requesting antibiotics  No regular exercise program EKG on today's visit shows normal sinus rhythm with rate 54 bpm, diffuse T-wave abnormality anterior precordial leads, inferior leads Unchanged from previous EKG   previous Lab work showing hemoglobin A1c 6.1, total cholesterol 144, LDL 83  Other past medical history Old stress test reviewed with her from 2013 at which time she had normal perfusion  symptoms at that time felt secondary to COPD  Has not had follow-up with pulmonary since 2014   tolerating her Crestor 40 mg daily. On this her cholesterol has significantly improved now down to within a excellent range. Chronic back pain  PMH:   has a past medical history of AAA (abdominal aortic aneurysm) ([place]) (01/2009); Acute bronchitis (03/20/2013); Angina; Anosmia; Asthma; Baker's cyst of knee (01/30/2014); Blood transfusion; Bradycardia; CAD (coronary artery disease); Cerebrovascular disease (01/2009); COPD (chronic obstructive pulmonary disease) ([place]); Depression; Dizziness; Dysrhythmia; GERD (gastroesophageal reflux disease); Herniated lumbar disc without myelopathy; Hyperglycemia (10/23/2015); Hyperlipidemia; Hypertension; Medicare annual wellness visit, subsequent (07/31/2013); NSTEMI (non-ST elevated myocardial infarction) ([place]) (11/12); Osteoporosis; PVD (peripheral vascular disease) ([place]); Shortness of breath; and Thoracic back pain (01/24/2014).  PSH:    Past Surgical History:  Procedure Laterality Date  . ABDOMINAL AORTIC ANEURYSM REPAIR    . CAROTID ENDARTERECTOMY  01/02/11   left  . CLOSED REDUCTION NASAL FRACTURE  11/2007  . CORONARY ANGIOPLASTY WITH STENT PLACEMENT  10/10/2011   drug eluting  to rc & saphenous  . CORONARY ARTERY BYPASS GRAFT  1999  . FEMORAL ARTERY STENT    . LEFT  HEART CATHETERIZATION WITH CORONARY/GRAFT ANGIOGRAM N/A 04/22/2011   Procedure: LEFT HEART CATHETERIZATION WITH Beatrix Fetters;  Surgeon: Jolaine Artist, MD;  Location: [facility] CATH  LAB;  Service: Cardiovascular;  Laterality: N/A;  . LEFT HEART CATHETERIZATION WITH CORONARY/GRAFT ANGIOGRAM N/A 10/10/2011   Procedure: LEFT HEART CATHETERIZATION WITH Beatrix Fetters;  Surgeon: Peter M Martinique, MD;  Location[facility] CATH LAB;  Service: Cardiovascular;  Laterality: N/A;  . NASAL SINUS SURGERY     twice  . TUBAL LIGATION      Current Outpatient Prescriptions  Medication Sig Dispense Refill  . acetaminophen (TYLENOL) 650 MG CR tablet Take 1,300 mg by mouth 2 (two) times daily.    Marland Kitchen albuterol (PROVENTIL HFA;VENTOLIN HFA) 108 (90 Base) MCG/ACT inhaler Inhale 2 puffs into the lungs every 6 (six) hours as needed for wheezing. 1 Inhaler 11  . aspirin 81 MG tablet Take 81 mg by mouth daily.    . Calcium Carbonate-Vitamin D (CALCIUM + D PO) Take 1 tablet by mouth 2 (two) times daily.    . clopidogrel (PLAVIX) 75 MG tablet TAKE 1 TABLET (75 MG TOTAL) BY MOUTH DAILY. 90 tablet 3  . diclofenac sodium (VOLTAREN) 1 % GEL Apply 2 g topically 3 (three) times daily as needed (pain).    . fluticasone (FLONASE) 50 MCG/ACT nasal spray Place 2 sprays into the nose 2 (two) times daily as needed. For congestion    . hydrochlorothiazide (HYDRODIURIL) 25 MG tablet Take 1 tablet (25 mg total) by mouth every morning. 90 tablet 3  . HYDROcodone-acetaminophen (NORCO/VICODIN) 5-325 MG tablet Take 1 tablet by mouth 2 (two) times daily as needed. 40 tablet 0  . HYDROcodone-homatropine (HYCODAN) 5-1.5 MG/5ML syrup Take 5 mLs by mouth every 8 (eight) hours as needed for cough. 120 mL 0  . isosorbide mononitrate (IMDUR) 30 MG 24 hr tablet TAKE 1 TABLET (30 MG TOTAL) BY MOUTH DAILY. 90 tablet 3  . Krill Oil 1000 MG CAPS Take 1,000 mg by mouth daily.    Marland Kitchen losartan (COZAAR) 100 MG tablet TAKE 1 TABLET (100 MG TOTAL) BY MOUTH DAILY. 90 tablet 3  . montelukast (SINGULAIR) 10 MG tablet TAKE 1 TABLET BY MOUTH AT BEDTIME 30 tablet 2  . mupirocin ointment (BACTROBAN) 2 % Place 1 application into the nose daily. Via  qtip to ear daily 22 g 0  . nitroGLYCERIN (NITROSTAT) 0.4 MG SL tablet Place 1 tablet (0.4 mg total) under the tongue every 5 (five) minutes as needed for chest pain. 25 tablet 6  . pantoprazole (PROTONIX) 40 MG tablet TAKE 1 TABLET (40 MG TOTAL) BY MOUTH DAILY. 90 tablet 3  . Polyethyl Glycol-Propyl Glycol (SYSTANE OP) Place 1 drop into both eyes daily as needed (dry eyes).    . potassium chloride (K-DUR) 10 MEQ tablet Take 1 tablet (10 mEq total) by mouth daily as needed. 90 tablet 3  . rosuvastatin (CRESTOR) 40 MG tablet Take 1 tablet (40 mg total) by mouth daily. 90 tablet 3  . sertraline (ZOLOFT) 50 MG tablet TAKE 1 TABLET EVERY DAY **NEED OFFICE VISIT 90 tablet 1  . Spacer/Aero-Holding Chambers DEVI 1 spacer to use prn with HFA device as directed 1 each 0  . SYMBICORT 160-4.5 MCG/ACT inhaler INHALE 2 PUFFS INTO THE LUNGS 2 (TWO) TIMES DAILY. 10.2 Inhaler 4  . tiotropium (SPIRIVA) 18 MCG inhalation capsule Place 1 capsule (18 mcg total) into inhaler and inhale daily. 30 capsule 11  . triamcinolone ointment (KENALOG) 0.5 % Apply 1 application topically 2 (  two) times daily. 30 g 2  . azithromycin (ZITHROMAX) 250 MG tablet Take daily 6 each 0   No current facility-administered medications for this visit.      Allergies:   Ciprofloxacin; Codeine; Erythromycin; Meperidine hcl; Penicillins; Shellfish allergy; and Tape   Social History:  The patient  reports that she quit smoking about 22 years ago. Her smoking use included Cigarettes. She has a 20.00 pack-year smoking history. She has never used smokeless tobacco. She reports that she does not drink alcohol or use drugs.   Family History:   family history includes Alcohol abuse in her brother; Aneurysm in her brother; Cancer in her father and sister; Cirrhosis in her maternal grandfather; Colon cancer (age of onset: 70) in her father; Diabetes in her maternal grandmother and mother; Heart attack in her brother and brother; Heart attack (age of  onset: 91) in her mother; Heart disease in her brother, brother, and father; Hyperlipidemia in her daughter; Hypertension in her brother and father; Hypothyroidism in her sister; Lung cancer in her father and sister; Obesity in her daughter and son; Stroke in her maternal grandmother and paternal grandmother.    Review of Systems: Review of Systems  Constitutional: Negative.   Respiratory: Negative.   Cardiovascular: Positive for chest pain.       Chest tightness, jaw pain, pain across her back  Gastrointestinal: Negative.   Musculoskeletal: Negative.   Neurological: Negative.   Psychiatric/Behavioral: Negative.   All other systems reviewed and are negative.    PHYSICAL EXAM: VS:  BP 130/80 (BP Location: Left Arm, Patient Position: Sitting, Cuff Size: Normal)   Pulse (!) 54   Ht '5\' 2"'$  (1.575 m)   Wt 140 lb 12 oz (63.8 kg)   BMI 25.74 kg/m  , BMI Body mass index is 25.74 kg/m. GEN: Well nourished, well developed, in no acute distress  HEENT: normal  Neck: no JVD, carotid bruits, or masses Cardiac: RRR; no murmurs, rubs, or gallops,no edema  Respiratory:  clear to auscultation bilaterally, normal work of breathing GI: soft, nontender, nondistended, + BS MS: no deformity or atrophy  Skin: warm and dry, no rash Neuro:  Strength and sensation are intact Psych: euthymic mood, full affect    Recent Labs: 10/23/2015: ALT 9; BUN 14; Creatinine, Ser 0.90; Hemoglobin 13.3; Platelets 174.0; Potassium 3.9; Sodium 141; TSH 3.09    Lipid Panel Lab Results  Component Value Date   CHOL 242 (H) 10/23/2015   HDL 33.50 (L) 10/23/2015   LDLCALC 180 (H) 10/23/2015   TRIG 145.0 10/23/2015      Wt Readings from Last 3 Encounters:  12/27/15 140 lb 12 oz (63.8 kg)  11/02/15 138 lb (62.6 kg)  10/23/15 141 lb 6 oz (64.1 kg)       ASSESSMENT AND PLAN:  Pre-procedural examination - Plan: Basic Metabolic Panel (BMET), CBC with Differential/Platelet, INR/PT, DG Chest 2 View Lab work,  chest x-ray ordered in preparation for cardiac catheterization next week  Unstable angina ([place]) -  Long discussion concerning her worsening chest pain since May 2017 As detailed above, symptoms concerning for unstable angina Given the unstable nature her symptoms, will recommend cardiac catheterization She prefers to have this done in [place], [place] schedule for August 9 in the morning with Dr. Jacky Kindle place orders in preparation Risk and benefits discussed with the patient including risk of stroke and heart attack She understands the risk and willing to proceed  Carotid stenosis 40-59% disease on the right, status post carotid  endarterectomy on the left Last visualized in September 2015, no change from 2013  Essential hypertension Blood pressure has been elevated at home but well controlled on today's visit Suggested she increase isosorbide to 30 mg twice a day for anginal symptoms and blood pressure  Coronary artery disease involving coronary bypass graft of native heart with angina pectoris with documented spasm ([place])  Hx of CABG Catheterization in 2013 done by Dr. Pierre Bali in [place] Report not in the computer We will request a copy of the report  Acute bronchitis Symptoms for the past week, Thick cough on exam Previously treated with doxycycline but she prefers to try Z-Pak Prescription provided today Recommended she call if symptoms do not start to improve    Total encounter time more than 40 minutes  Greater than 50% was spent in counseling and coordination of care with the patient   Disposition:   F/U  1 month   Orders Placed This Encounter  Procedures  . DG Chest 2 View  . Basic Metabolic Panel (BMET)  . CBC with Differential/Platelet  . INR/PT  . EKG 12-Lead     Signed, Esmond Plants, M.D., Ph.D. 12/27/2015  [place], [place]

## 2016-01-02 NOTE — Research (Signed)
LEADERS FREE Informed Consent   Subject Name: Andrea [name]  Subject met inclusion and exclusion criteria.  The informed consent form, study requirements and expectations were reviewed with the subject and questions and concerns were addressed prior to the signing of the consent form.  The subject verbalized understanding of the trail requirements.  The subject agreed to participate in the LEADERS FREE trial and signed the informed consent.  The informed consent was obtained prior to performance of any protocol-specific procedures for the subject.  A copy of the signed informed consent was given to the subject and a copy was placed in the subject's medical record.  [name] 01/02/2016, 7:20

## 2016-01-02 NOTE — Progress Notes (Deleted)
CRITICAL VALUE ALERT  Critical value received:  Troponin 19.63  Date of notification:  01/02/16  Time of notification:  0950  Critical value read back:Yes.    Nurse who received alert:  Tery Sanfilippo  MD notified (1st page):  Almyra Deforest PA  Time of first page:  1135 am  MD notified (2nd page):  Time of second page:  Responding MD:  Almyra Deforest PA Time MD responded: 1135 am

## 2016-01-02 NOTE — Progress Notes (Signed)
TR BAND REMOVAL  LOCATION:    right radial  DEFLATED PER PROTOCOL:    Yes.    TIME BAND OFF / DRESSING APPLIED:    1430   SITE UPON ARRIVAL:    Level 0  SITE AFTER BAND REMOVAL:    Level 0  CIRCULATION SENSATION AND MOVEMENT:    Within Normal Limits   Yes.    COMMENTS:   Tolerated procedure well, pstt TRB instruction given

## 2016-01-03 ENCOUNTER — Encounter (HOSPITAL_COMMUNITY): Payer: Self-pay

## 2016-01-03 DIAGNOSIS — I10 Essential (primary) hypertension: Secondary | ICD-10-CM | POA: Diagnosis not present

## 2016-01-03 DIAGNOSIS — I739 Peripheral vascular disease, unspecified: Secondary | ICD-10-CM | POA: Diagnosis not present

## 2016-01-03 DIAGNOSIS — I2510 Atherosclerotic heart disease of native coronary artery without angina pectoris: Secondary | ICD-10-CM | POA: Diagnosis not present

## 2016-01-03 DIAGNOSIS — Z951 Presence of aortocoronary bypass graft: Secondary | ICD-10-CM | POA: Diagnosis not present

## 2016-01-03 DIAGNOSIS — I25110 Atherosclerotic heart disease of native coronary artery with unstable angina pectoris: Secondary | ICD-10-CM | POA: Diagnosis not present

## 2016-01-03 DIAGNOSIS — Z5309 Procedure and treatment not carried out because of other contraindication: Secondary | ICD-10-CM | POA: Diagnosis not present

## 2016-01-03 DIAGNOSIS — I119 Hypertensive heart disease without heart failure: Secondary | ICD-10-CM

## 2016-01-03 DIAGNOSIS — Z880 Allergy status to penicillin: Secondary | ICD-10-CM | POA: Diagnosis not present

## 2016-01-03 DIAGNOSIS — Z7902 Long term (current) use of antithrombotics/antiplatelets: Secondary | ICD-10-CM | POA: Diagnosis not present

## 2016-01-03 DIAGNOSIS — J440 Chronic obstructive pulmonary disease with acute lower respiratory infection: Secondary | ICD-10-CM | POA: Diagnosis not present

## 2016-01-03 DIAGNOSIS — Z7982 Long term (current) use of aspirin: Secondary | ICD-10-CM | POA: Diagnosis not present

## 2016-01-03 DIAGNOSIS — E785 Hyperlipidemia, unspecified: Secondary | ICD-10-CM | POA: Diagnosis not present

## 2016-01-03 DIAGNOSIS — I252 Old myocardial infarction: Secondary | ICD-10-CM | POA: Diagnosis not present

## 2016-01-03 DIAGNOSIS — Z87891 Personal history of nicotine dependence: Secondary | ICD-10-CM | POA: Diagnosis not present

## 2016-01-03 DIAGNOSIS — M549 Dorsalgia, unspecified: Secondary | ICD-10-CM | POA: Diagnosis not present

## 2016-01-03 DIAGNOSIS — I25710 Atherosclerosis of autologous vein coronary artery bypass graft(s) with unstable angina pectoris: Secondary | ICD-10-CM | POA: Diagnosis not present

## 2016-01-03 LAB — CBC
HEMATOCRIT: 42.5 % (ref 36.0–46.0)
Hemoglobin: 13.5 g/dL (ref 12.0–15.0)
MCH: 27.2 pg (ref 26.0–34.0)
MCHC: 31.8 g/dL (ref 30.0–36.0)
MCV: 85.5 fL (ref 78.0–100.0)
Platelets: 148 10*3/uL — ABNORMAL LOW (ref 150–400)
RBC: 4.97 MIL/uL (ref 3.87–5.11)
RDW: 15.3 % (ref 11.5–15.5)
WBC: 8.7 10*3/uL (ref 4.0–10.5)

## 2016-01-03 LAB — BASIC METABOLIC PANEL
Anion gap: 8 (ref 5–15)
BUN: 12 mg/dL (ref 6–20)
CHLORIDE: 103 mmol/L (ref 101–111)
CO2: 28 mmol/L (ref 22–32)
Calcium: 9.1 mg/dL (ref 8.9–10.3)
Creatinine, Ser: 0.88 mg/dL (ref 0.44–1.00)
GFR calc Af Amer: >60 mL/min (ref >60)
GFR calc non Af Amer: >60 mL/min (ref >60)
Glucose, Bld: 108 mg/dL — ABNORMAL HIGH (ref 65–99)
POTASSIUM: 4 mmol/L (ref 3.5–5.1)
SODIUM: 139 mmol/L (ref 135–145)

## 2016-01-03 LAB — TROPONIN I
TROPONIN I: 5.73 ng/mL — AB (ref <0.03)
Troponin I: 7.39 ng/mL (ref <0.03)

## 2016-01-03 MED ORDER — HEART ATTACK BOUNCING BOOK
Freq: Once | Status: AC
Start: 2016-01-03 — End: 2016-01-03
  Administered 2016-01-03: 07:00:00
  Filled 2016-01-03: qty 1

## 2016-01-03 MED ORDER — CLOPIDOGREL BISULFATE 75 MG PO TABS: 75.0000 mg | ORAL_TABLET | Freq: Every day | ORAL | 3 refills | Status: DC

## 2016-01-03 MED ORDER — NITROGLYCERIN 0.4 MG SL SUBL: 0.4000 mg | SUBLINGUAL_TABLET | SUBLINGUAL | 2 refills | Status: DC | PRN

## 2016-01-03 MED ORDER — ISOSORBIDE MONONITRATE ER 60 MG PO TB24: 60.0000 mg | ORAL_TABLET | Freq: Every day | ORAL | 3 refills | Status: DC

## 2016-01-03 MED ORDER — ANGIOPLASTY BOOK
Freq: Once | Status: AC
  Administered 2016-01-03: 06:00:00
  Filled 2016-01-03: qty 1

## 2016-01-03 MED FILL — Heparin Sodium (Porcine) 2 Unit/ML in Sodium Chloride 0.9%: INTRAMUSCULAR | Qty: 500 | Status: AC

## 2016-01-03 NOTE — Progress Notes (Signed)
Patient Profile: 75 year old female, followed by Dr. Rockey Situ, with a history of chronic bradycardia, spinal stenosis, peripheral vascular disease status post CEA on the left , 40-59% residual disease on the right, hyperlipidemia,  coronary artery disease s/p bypass surgery in 1999 ([place] in [place]), stent placement to distal RCA may 2013, who was admitted for elective [facility] in the setting of recurrent unstable angina.   Subjective: No complaints current. No recurrent CP overnight. No dyspnea. Left radial cath site is stable.   Objective: Vital signs in last 24 hours: Temp:  [98 F (36.7 C)-98.3 F (36.8 C)] 98.3 F (36.8 C) (08/10 0712) Pulse Rate:  [48-148] 62 (08/10 0712) Resp:  [0-40] 20 (08/10 0712) BP: (106-192)/(41-101) 120/69 (08/10 0712) SpO2:  [0 %-100 %] 95 % (08/10 0712) Weight:  [142 lb 6.7 oz (64.6 kg)] 142 lb 6.7 oz (64.6 kg) (08/10 0712)   Intake/Output from previous day: 08/09 0701 - 08/10 0700 In: 240 [P.O.:240] Out: 1100 [Urine:1100] Intake/Output this shift: No intake/output data recorded.  Medications . aspirin  81 mg Oral Daily  . clopidogrel  75 mg Oral Daily  . hydrochlorothiazide  25 mg Oral [id number]  . isosorbide mononitrate  30 mg Oral Daily  . losartan  100 mg Oral Daily  . mometasone-formoterol  2 puff Inhalation BID  . montelukast  10 mg Oral QHS  . pantoprazole  40 mg Oral Daily  . rosuvastatin  40 mg Oral Daily  . sertraline  50 mg Oral Daily  . sodium chloride flush  3 mL Intravenous Q12H  . tiotropium  18 mcg Inhalation Daily   PE: General appearance: alert, cooperative and no distress Neck: no carotid bruit and no JVD Lungs: clear to auscultation bilaterally Heart: regular rate and rhythm, S1, S2 normal, no murmur, click, rub or gallop Extremities: no LEE Pulses: 2+ and symmetric Skin: warm and dry Neurologic: Grossly normal  Lab Results:   Recent Labs  01/03/16 0445  WBC 8.7  HGB 13.5  HCT 42.5  PLT 148*    BMET  Recent Labs  01/03/16 0445  NA 139  K 4.0  CL 103  CO2 28  GLUCOSE 108*  BUN 12  CREATININE 0.88  CALCIUM 9.1   Cardiac Panel (last 3 results)  Recent Labs  01/02/16 0910 01/03/16 0445  TROPONINI <0.03 7.39*   Studies/Results: Procedures   Left Heart Cath and Coronary Angiography  Conclusion     Ost RCA lesion, 100 %stenosed.  Ost 2nd Mrg to 2nd Mrg lesion, 40 %stenosed.  Prox LAD to Dist LAD lesion, 65 %stenosed.  LIMA and is anatomically normal.  The flow in the graft is reversed.  SVG.  Origin to Prox Graft lesion, 40 %stenosed.  Dist Graft-1 lesion, 95 %stenosed.  Ost RPDA to RPDA lesion, 10 %stenosed.  A stent was successfully placed, and does not overlap previously placed stent.  Mid Graft lesion, 95 %stenosed.  Post intervention, there is a 0% residual stenosis.  A stent was successfully placed, and overlaps previously placed stent.  Dist Graft-2 lesion, 99 %stenosed.  Post intervention, there is a 0% residual stenosis.  A stent was successfully placed, and does not overlap previously placed stent.  Mid Cx lesion, 80 %stenosed.  Post intervention, there is a 0% residual stenosis.   1. Significant underlying three-vessel coronary artery disease. Patent LIMA to LAD. However, there is retrograde flow in the graft itself likely due to no obstructive disease down the native LAD. The native LAD has diffuse  60-70% calcified disease in the proximal and midsegment. Severe new disease in SVG to RCA. Collaterals were noted from left-to-right. Worsening disease in the native left circumflex which does not have a patent bypass.  2. Moderate left subclavian stenosis which was not significant by gradients. 3. Normal left ventricular end-diastolic pressure. Left ventricular angiography was not performed. 4. Successful angioplasty and drug-eluting stent placement to the SVG to RCA with 2 bio freedom stent placement. Successful angioplasty and  stent placement to the mid left circumflex with 1 I freedom stent.     Assessment/Plan  Active Problems:   Hyperlipidemia   CAD (coronary artery disease)   Hx of CABG   Effort angina ([place])  1. CAD/ Angina: s/p LHC 01/02/16. Complete angiographic details outlined above. S/p PCI + DES to SVG-RCA and PCI + DES to mid LCx. Per Dr. Fletcher Anon, the SVG to RCA is an old graft with new areas of disease.  It is felt that this graft is high risk for closure in spite of intervention yesterday. The patient was enrolled in the bio freedom study and thus dual antiplatelet therapy is optional after one month. However, recommend continuing Plavix indefinitely.  -- Patient also had significant chest pain during SVG to RCA PCI with ST changes. By the end of the procedure, her chest pain resolved completely. However, mild ST elevation persisted possibly due to microvascular injury. Troponin rose from <0.03>>7.39. EKG today shows inferior Twaves. She is CP free. We will check a f/u troponin level now to ensure enzymes are trending downward.   -- Continue ASA + Plavix, Crestor, losartan, Imdur and HCTZ. Not on a BB given chronic bradycardia.   -- Renal function, hgb and BP all stable post cath. 2+ left radial pulse  -- Ambulate with cardiac rehab to ensure no recurrent angina  2. HLD: last lipid panel 09/2015 showed LDL of 180 mg/dL. Continue Crestor 40 mg. She is overdue for f/u FLP but has already eaten today. Recommend rechecking in the outpatient setting to see if LDL has improved. Goal LDL in the setting of severe CAD is <70 mg/dL.   3. HTN: Well controlled this am. Continue current regimen. HCTZ/ARB. No BB 2/2 bradycardia.   Dispo: plan to observer patient further this morning. If no recurrent angina with ambulation with cardiac rehab and if no further increase in troponin level, will d/c home later today. F/u with Dr. Rockey Situ in [place].    LOS: 0 days   Brittainy M. Ladoris Gene 01/03/2016 8:02 AM   The  patient was seen, examined and discussed with Brittainy M. Rosita Fire, PA-C and I agree with the above.   75 year old female with known CAD, s/p S/p PCI + DES to SVG-RCA and PCI + DES to mid LCx with good results but ST changes and chest pain during the intervention, now resolved chest pain.  Troponin continues to rise, we will repeat and if downtrending discharge home later today. ASA/Plavix per study protocol, rosuvastatin, losartan, increase Imdur to 60 mg po daily for uncontrolled hypertension.  No betablocker sec to bradycardia. We will arrange follow up in 1-2 weeks.  Ena Dawley 01/03/2016

## 2016-01-03 NOTE — Progress Notes (Signed)
CRITICAL VALUE ALERT  Critical value received:  Troponin I 7.39   Date of notification:  01/03/16  Time of notification:  6578  Critical value read back: Yes  Nurse who received alert: Helayne Seminole, RN  MD notified (1st page): Card On Call Fellow    Time of first page:  [phone]  MD notified (2nd page):na  Time of second page:na  Responding MD:  na  Time MD responded: na

## 2016-01-03 NOTE — Progress Notes (Signed)
Pt given discharge instructions and verbalized understanding of all.  Pt is aware of [facility] [place]  Pt also understands left radial restrictions at discharge.  Pt without complaints and no chest pain at present.  All belongings with pt. Transported via wc to car with husband.

## 2016-01-03 NOTE — Care Management Note (Signed)
Case Management Note  Patient Details  Name: Udell Blasingame MRN: [id number] Date of Birth: [birth year]  Subjective/Objective:  Patient is from home, s/p left cath, patient already on home med plavix,  NCM will cont to follow for dc needs.                   Action/Plan:   Expected Discharge Date:                  Expected Discharge Plan:  Home/Self Care  In-House Referral:     Discharge planning Services  CM Consult  Post Acute Care Choice:    Choice offered to:     DME Arranged:    DME Agency:     HH Arranged:    HH Agency:     Status of Service:  In process, will continue to follow  If discussed at Long Length of Stay Meetings, dates discussed:    Additional Comments:  Zenon Mayo, RN 01/03/2016, 9:15 AM

## 2016-01-03 NOTE — Discharge Summary (Signed)
Discharge Summary    [name],  MRN: [id number], DOB/AGE: 12-06-1940 75 y.o.  Admit date: 01/02/2016 Discharge date: 01/03/2016  Primary Care Provider: [name] Primary Cardiologist: Dr. Rockey Situ  Discharge Diagnoses    Active Problems:   Hyperlipidemia   CAD (coronary artery disease)   Hx of CABG   Effort angina (HCC)   Allergies Allergies  Allergen Reactions  . Ciprofloxacin     REACTION: rash IV  . Codeine     REACTION: nausea/vomiting  . Erythromycin     REACTION: tongue burns  . Meperidine Hcl     REACTION: Nausea/vomiting  . Penicillins     REACTION: rash  . Shellfish Allergy Nausea And Vomiting  . Tape Rash    Diagnostic Studies/Procedures    Left Heart Cath and Coronary Angiography  Conclusion     Ost RCA lesion, 100 %stenosed.  Ost 2nd Mrg to 2nd Mrg lesion, 40 %stenosed.  Prox LAD to Dist LAD lesion, 65 %stenosed.  LIMA and is anatomically normal.  The flow in the graft is reversed.  SVG.  Origin to Prox Graft lesion, 40 %stenosed.  Dist Graft-1 lesion, 95 %stenosed.  Ost RPDA to RPDA lesion, 10 %stenosed.  A stent was successfully placed, and does not overlap previously placed stent.  Mid Graft lesion, 95 %stenosed.  Post intervention, there is a 0% residual stenosis.  A stent was successfully placed, and overlaps previously placed stent.  Dist Graft-2 lesion, 99 %stenosed.  Post intervention, there is a 0% residual stenosis.  A stent was successfully placed, and does not overlap previously placed stent.  Mid Cx lesion, 80 %stenosed.  Post intervention, there is a 0% residual stenosis.  1. Significant underlying three-vessel coronary artery disease. Patent LIMA to LAD. However, there is retrograde flow in the graft itself likely due to no obstructive disease down the native LAD. The native LAD has diffuse 60-70% calcified disease in the proximal and midsegment. Severe new disease in SVG to RCA.  Collaterals were noted from left-to-right. Worsening disease in the native left circumflex which does not have a patent bypass.  2. Moderate left subclavian stenosis which was not significant by gradients. 3. Normal left ventricular end-diastolic pressure. Left ventricular angiography was not performed. 4. Successful angioplasty and drug-eluting stent placement to the SVG to RCA with 2 bio freedom stent placement. Successful angioplasty and stent placement to the mid left circumflex with 1 I freedom stent.      History of Present Illness     74 year old female, followed by Dr. Rockey Situ, with a history of chronic bradycardia, spinal stenosis, peripheral vascular disease status post CEA on the left , 40-59% residual disease on the right, hyperlipidemia, coronary artery disease s/p bypass surgery in 1999 ([facility] in [place]), stent placement to distal RCA may 2013, who was admitted for elective [facility] in the setting of recurrent unstable angina.   Hospital Course    Patient presented to [facility] on 01/02/16 to undergo the planned procedure, which was performed by Dr. Fletcher Anon. Vascular access was obtained via the left radial artery. Complete angiographic details outlined above. She underwent successful PCI + DES to SVG-RCA and PCI + DES to mid LCx. Per Dr. Fletcher Anon, the SVG to RCA is an old graft with new areas of disease.  It is felt that this graft is high risk for closure in spite of intervention. The patient was enrolled in the bio freedom study and thus dual antiplatelet therapy is optional after one  month. However, he recommends continuing Plavix indefinitely. She was continued on ASA, losartan and Imdur. Imdur was increased to 60 mg daily. She is not on a BB due to chronic bradycardia.   It should be noted that the patient had significant chest pain during SVG to RCA PCI with ST changes. By the end of the procedure, her chest pain resolved completely. However, mild ST elevation persisted possibly due to  microvascular injury. However her CP resolved after the procedure. Troponin rose from <0.03>>7.39. However she had a f/u troponin prior to discharge which demonstrated a downward trend at 5.7. She had no other recurrent CP. No dyspnea. She ambulated with cardiac rehab w/o difficulty. Left radial cath site and renal function remained stable post cath. Vital signs also remained stable. She was last seen and examined by Dr. Meda Coffee, who determined she was stable for discharge home. She will f/u with Dr. Rockey Situ in [place]. F/u is arranged for 01/22/16.    Consultants: none   Discharge Vitals Blood pressure 120/69, pulse 62, temperature 98.3 F (36.8 C), temperature source Oral, resp. rate 20, weight 142 lb 6.7 oz (64.6 kg), SpO2 96 %.  Filed Weights   01/03/16 0712  Weight: 142 lb 6.7 oz (64.6 kg)    Labs & Radiologic Studies    CBC  Recent Labs  01/03/16 0445  WBC 8.7  HGB 13.5  HCT 42.5  MCV 85.5  PLT 481*   Basic Metabolic Panel  Recent Labs  01/03/16 0445  NA 139  K 4.0  CL 103  CO2 28  GLUCOSE 108*  BUN 12  CREATININE 0.88  CALCIUM 9.1   Liver Function Tests No results for input(s): AST, ALT, ALKPHOS, BILITOT, PROT, ALBUMIN in the last 72 hours. No results for input(s): LIPASE, AMYLASE in the last 72 hours. Cardiac Enzymes  Recent Labs  01/02/16 0910 01/03/16 0445 01/03/16 1113  TROPONINI <0.03 7.39* 5.73*   BNP Invalid input(s): POCBNP D-Dimer No results for input(s): DDIMER in the last 72 hours. Hemoglobin A1C No results for input(s): HGBA1C in the last 72 hours. Fasting Lipid Panel No results for input(s): CHOL, HDL, LDLCALC, TRIG, CHOLHDL, LDLDIRECT in the last 72 hours. Thyroid Function Tests No results for input(s): TSH, T4TOTAL, T3FREE, THYROIDAB in the last 72 hours.  Invalid input(s): FREET3 _____________  Dg Chest 2 View  Result Date: 12/28/2015 CLINICAL DATA:  Preoperative evaluation for upcoming cardiac catheterization EXAM: CHEST  2  VIEW COMPARISON:  03/19/2015 FINDINGS: Cardiac shadow is mildly enlarged. Postsurgical changes are again seen and stable. The lungs are clear bilaterally. No acute bony abnormality is seen. IMPRESSION: No active cardiopulmonary disease. Electronically Signed   By: Inez Catalina M.D.   On: 12/28/2015 14:54   Disposition   Pt is being discharged home today in good condition.  Follow-up Plans & Appointments    Follow-up Information    Ida Rogue, MD Follow up on 01/22/2016.   Specialty:  Cardiology Why:  10:40 AM  Contact information: [place] [address] [phone]          Discharge Instructions    AMB Referral to Cardiac Rehabilitation - Phase II    Complete by:  As directed   Diagnosis:   Stable Angina PTCA     Amb Referral to Cardiac Rehabilitation    Complete by:  As directed   Troponin rose after PCI. ?NSTEMI   Diagnosis:  Coronary Stents   Diet - low sodium heart healthy  Complete by:  As directed   Increase activity slowly    Complete by:  As directed      Discharge Medications   Current Discharge Medication List    CONTINUE these medications which have CHANGED   Details  clopidogrel (PLAVIX) 75 MG tablet Take 1 tablet (75 mg total) by mouth daily. Qty: 90 tablet, Refills: 3    isosorbide mononitrate (IMDUR) 60 MG 24 hr tablet Take 1 tablet (60 mg total) by mouth daily. Qty: 90 tablet, Refills: 3    nitroGLYCERIN (NITROSTAT) 0.4 MG SL tablet Place 1 tablet (0.4 mg total) under the tongue every 5 (five) minutes as needed for chest pain. Qty: 25 tablet, Refills: 2      CONTINUE these medications which have NOT CHANGED   Details  acetaminophen (TYLENOL) 650 MG CR tablet Take 1,300 mg by mouth 2 (two) times daily.    albuterol (PROVENTIL HFA;VENTOLIN HFA) 108 (90 Base) MCG/ACT inhaler Inhale 2 puffs into the lungs every 6 (six) hours as needed for wheezing. Qty: 1 Inhaler, Refills: 11    aspirin 81 MG tablet Take 81 mg by  mouth daily.    Calcium Carbonate-Vitamin D (CALCIUM + D PO) Take 1 tablet by mouth 2 (two) times daily.    fluticasone (FLONASE) 50 MCG/ACT nasal spray Place 2 sprays into the nose 2 (two) times daily as needed for allergies. For congestion     hydrochlorothiazide (HYDRODIURIL) 25 MG tablet Take 1 tablet (25 mg total) by mouth every morning. Qty: 90 tablet, Refills: 3    HYDROcodone-acetaminophen (NORCO/VICODIN) 5-325 MG tablet Take 1 tablet by mouth 2 (two) times daily as needed. Qty: 40 tablet, Refills: 0    Krill Oil 1000 MG CAPS Take 1,000 mg by mouth daily.    losartan (COZAAR) 100 MG tablet TAKE 1 TABLET (100 MG TOTAL) BY MOUTH DAILY. Qty: 90 tablet, Refills: 3    montelukast (SINGULAIR) 10 MG tablet TAKE 1 TABLET BY MOUTH AT BEDTIME Qty: 30 tablet, Refills: 2    pantoprazole (PROTONIX) 40 MG tablet TAKE 1 TABLET (40 MG TOTAL) BY MOUTH DAILY. Qty: 90 tablet, Refills: 3    Polyethyl Glycol-Propyl Glycol (SYSTANE OP) Place 1 drop into both eyes daily as needed (dry eyes).    rosuvastatin (CRESTOR) 40 MG tablet Take 1 tablet (40 mg total) by mouth daily. Qty: 90 tablet, Refills: 3   Associated Diagnoses: Hyperlipidemia    sertraline (ZOLOFT) 50 MG tablet TAKE 1 TABLET EVERY DAY **NEED OFFICE VISIT Qty: 90 tablet, Refills: 1    Spacer/Aero-Holding Chambers DEVI 1 spacer to use prn with HFA device as directed Qty: 1 each, Refills: 0   Associated Diagnoses: Acute bronchitis, unspecified organism    SYMBICORT 160-4.5 MCG/ACT inhaler INHALE 2 PUFFS INTO THE LUNGS 2 (TWO) TIMES DAILY. Qty: 10.2 Inhaler, Refills: 4    tiotropium (SPIRIVA) 18 MCG inhalation capsule Place 1 capsule (18 mcg total) into inhaler and inhale daily. Qty: 30 capsule, Refills: 11    azithromycin (ZITHROMAX) 250 MG tablet Take daily Qty: 6 each, Refills: 0    diclofenac sodium (VOLTAREN) 1 % GEL Apply 2 g topically 3 (three) times daily as needed (pain).    HYDROcodone-homatropine (HYCODAN) 5-1.5  MG/5ML syrup Take 5 mLs by mouth every 8 (eight) hours as needed for cough. Qty: 120 mL, Refills: 0    mupirocin ointment (BACTROBAN) 2 % Place 1 application into the nose daily. Via qtip to ear daily Qty: 22 g, Refills: 0   Associated Diagnoses:  Folliculitis    potassium chloride (K-DUR) 10 MEQ tablet Take 1 tablet (10 mEq total) by mouth daily as needed. Qty: 90 tablet, Refills: 3    triamcinolone ointment (KENALOG) 0.5 % Apply 1 application topically 2 (two) times daily. Qty: 30 g, Refills: 2   Associated Diagnoses: Dermatitis         Aspirin prescribed at discharge?  Yes High Intensity Statin Prescribed? (Lipitor 40-'80mg'$  or Crestor 20-'40mg'$ ): Yes Beta Blocker Prescribed? No: baseline bradycardia For EF <40%, was ACEI/ARB Prescribed? Yes ADP Receptor Inhibitor Prescribed? (i.e. Plavix etc.-Includes Medically Managed Patients): Yes For EF <40%, Aldosterone Inhibitor Prescribed? No:  Was EF assessed during THIS hospitalization? No:  Was Cardiac Rehab II ordered? (Included Medically managed Patients): Yes   Outstanding Labs/Studies   None   Duration of Discharge Encounter   Greater than 30 minutes including physician time.  Signed, Lyda Jester PA-C 01/03/2016, 1:24 PM   Andrea [name] 01/03/2016

## 2016-01-03 NOTE — Progress Notes (Signed)
CARDIAC REHAB PHASE I   PRE:  Rate/Rhythm: 56 SB  BP:  Supine: 145/53  Sitting:   Standing:    SaO2:   MODE:  Ambulation: 800 ft   POST:  Rate/Rhythm: 70 SR  BP:  Supine:   Sitting: 103/64  Standing:    SaO2:  0835-0945 Pt walked 800 ft with steady gait. No CP or dizziness with BP drop. Tolerated well. Education completed with pt who voiced understanding. Reviewed NTG use, risk factors, heart healthy diet, ex ed, stent. Discussed CRP 2 which pt has attended before. Will refer to [place] at pt's request.    Graylon Good, RN BSN  01/03/2016 9:42 AM

## 2016-01-04 NOTE — Progress Notes (Signed)
Great, thx for update and doing cath in [place]

## 2016-01-22 ENCOUNTER — Ambulatory Visit (INDEPENDENT_AMBULATORY_CARE_PROVIDER_SITE_OTHER): Payer: PPO | Admitting: Cardiovascular Disease

## 2016-01-22 ENCOUNTER — Encounter: Payer: Self-pay | Admitting: Cardiovascular Disease

## 2016-01-22 VITALS — BP 112/70 | HR 58 | Ht 62.0 in | Wt 140.5 lb

## 2016-01-22 DIAGNOSIS — Z951 Presence of aortocoronary bypass graft: Secondary | ICD-10-CM

## 2016-01-22 DIAGNOSIS — E785 Hyperlipidemia, unspecified: Secondary | ICD-10-CM | POA: Diagnosis not present

## 2016-01-22 DIAGNOSIS — I2510 Atherosclerotic heart disease of native coronary artery without angina pectoris: Secondary | ICD-10-CM

## 2016-01-22 DIAGNOSIS — I10 Essential (primary) hypertension: Secondary | ICD-10-CM | POA: Diagnosis not present

## 2016-01-22 DIAGNOSIS — I6523 Occlusion and stenosis of bilateral carotid arteries: Secondary | ICD-10-CM

## 2016-01-22 DIAGNOSIS — I739 Peripheral vascular disease, unspecified: Secondary | ICD-10-CM

## 2016-01-22 DIAGNOSIS — I2583 Coronary atherosclerosis due to lipid rich plaque: Principal | ICD-10-CM

## 2016-01-22 DIAGNOSIS — I200 Unstable angina: Secondary | ICD-10-CM

## 2016-01-22 NOTE — Progress Notes (Signed)
Cardiology Office Note  Date:  01/22/2016   ID:  Andrea [name], DOB [birth year], MRN [id number]  PCP:  [name], MD   Chief Complaint  Patient presents with  . Other    Follow up from cardiac cath & stent x 3 placement. Meds reviewed by the patient verbally. "doing well."     HPI:  Andrea [name] is a pleasant 75 year old woman with history of chronic bradycardia,  spinal stenosis, peripheral vascular disease, status post CEA on the left ,  40-59% residual disease on the right,   hyperlipidemia,  coronary artery disease and bypass surgery in 1999, stent placement to distal RCA may 2013, who presents for routine followup  Of her coronary artery disease . She has underlying COPD, long smoking history for at least 30 years CABG at [facility] in [place]  In follow-up, we discussed recent cardiac catheterization 01/02/2016 She had 2 stents placed to the vein graft to the RCA, also angioplasty with stent placement to the mid left circumflex Following intervention she feels much better, no chest pain, shortness of breath improved Previously was taking nitroglycerin for anginal symptoms, now no longer needing nitroglycerin Was taking NTG 5 x in one day  She is concerned about a chronic Left groin pain She has a history of stents to her lower extremities, these have not been visualized in many years  EKG on today's visit shows normal sinus rhythm with diffuse T-wave abnormality anterolateral and inferior leads  Details of catheterization below discussed with her Cath 01/02/16 Significant underlying three-vessel coronary artery disease. Patent LIMA to LAD. However, there is retrograde flow in the graft itself likely due to no obstructive disease down the native LAD. The native LAD has diffuse 60-70% calcified disease in the proximal and midsegment. Severe new disease in SVG to RCA. Collaterals were noted from left-to-right. Worsening disease in the native left circumflex which does not have a patent  bypass.  2. Moderate left subclavian stenosis which was not significant by gradients. 3. Normal left ventricular end-diastolic pressure. Left ventricular angiography was not performed. 4. Successful angioplasty and drug-eluting stent placement to the SVG to RCA with 2 bio freedom stent placement. Successful angioplasty and stent placement to the mid left circumflex with 1 I freedom stent.  The SVG to RCA is an old graft with new areas of disease. I think this graft is high risk for closure in spite of intervention today. The patient was enrolled in the bio freedom study and thus dual antiplatelet therapy is optional after one month. However, I recommend continuing Plavix indefinitely. The patient did have significant chest pain during SVG to RCA PCI with ST changes. By the end of the procedure, her chest pain resolved completely. However, mild ST elevation persisted possibly due to microvascular injury.    Last catheterization November 2012 Atretic LIMA to the LAD,  She has saphenous vein graft sequential to PDA and PL loss of distal limb of vein graft to the PDA and PL with left-to-right collaterals 40-50% proximal to mid LAD with focal 60-70% mid LAD 2 large OM's, 30% mid OM1, 40-50% ostial Ejection fraction 55% with inferobasal akinesis   previously stopped her Crestor, cholesterol from 144 up to 242 in May 30. 2017  No regular exercise program  Other past medical history Old stress test reviewed with her from 2013 at which time she had normal perfusion  symptoms at that time felt secondary to COPD  Has not had follow-up with pulmonary since 2014   tolerating  her Crestor 40 mg daily. On this her cholesterol has significantly improved now down to within a excellent range. Chronic back pain  PMH:   has a past medical history of AAA (abdominal aortic aneurysm) ([place]) (01/2009); Acute bronchitis (03/20/2013; 2017); Angina; Anosmia; Asthma; Baker's cyst of knee (01/30/2014); Basal cell  carcinoma; Bradycardia; CAD (coronary artery disease); Cerebrovascular disease (01/2009); Chronic thoracic back pain; COPD (chronic obstructive pulmonary disease) ([place]); Depression; Dizziness; Dysrhythmia; GERD (gastroesophageal reflux disease); Heart murmur; Herniated lumbar disc without myelopathy; History of blood transfusion (1999); Hyperglycemia (10/23/2015); Hyperlipidemia; Hypertension; Medicare annual wellness visit, subsequent (07/31/2013); NSTEMI (non-ST elevated myocardial infarction) ([place]) (11/12); Osteoarthritis of back; Osteoporosis; Pneumonia ("several times"); Pulmonary embolism ([place]) (1999); PVD (peripheral vascular disease) ([place] Shortness of breath; and Squamous carcinoma ([place]).  PSH:    Past Surgical History:  Procedure Laterality Date  . ABDOMINAL AORTIC ANEURYSM REPAIR     pt denies this hx on 01/02/2016  . BASAL CELL CARCINOMA EXCISION    . CARDIAC CATHETERIZATION N/A 01/02/2016   Procedure: Left Heart Cath and Coronary Angiography;  Surgeon: Wellington Hampshire, MD;  Location: [facility];  Service: Cardiovascular;  Laterality: N/A;  . CARDIAC CATHETERIZATION  1996; 1999  . CAROTID ENDARTERECTOMY Left 01/02/2011  . CATARACT EXTRACTION W/ INTRAOCULAR LENS  IMPLANT, BILATERAL    . CLOSED REDUCTION NASAL FRACTURE  11/2007  . CORONARY ANGIOPLASTY WITH STENT PLACEMENT  10/10/2011   drug eluting  to rc & saphenous  . CORONARY ARTERY BYPASS GRAFT  1999   "CABG X3"  . DILATION AND CURETTAGE OF UTERUS    . FEMORAL ARTERY STENT Bilateral   . FRACTURE SURGERY    . LEFT HEART CATHETERIZATION WITH CORONARY/GRAFT ANGIOGRAM N/A 04/22/2011   Procedure: LEFT HEART CATHETERIZATION WITH Beatrix Fetters;  Surgeon: Jolaine Artist, MD;  Location[facility];  Service: Cardiovascular;  Laterality: N/A;  . LEFT HEART CATHETERIZATION WITH CORONARY/GRAFT ANGIOGRAM N/A 10/10/2011   Procedure: LEFT HEART CATHETERIZATION WITH Beatrix Fetters;  Surgeon: Peter M Martinique, MD;   Location[facility] C[facility] LAB;  Service: Cardiovascular;  Laterality: N/A;  . NASAL SINUS SURGERY     twice  . SQUAMOUS CELL CARCINOMA EXCISION    . TUBAL LIGATION      Current Outpatient Prescriptions  Medication Sig Dispense Refill  . acetaminophen (TYLENOL) 650 MG CR tablet Take 1,300 mg by mouth 2 (two) times daily.    Marland Kitchen albuterol (PROVENTIL HFA;VENTOLIN HFA) 108 (90 Base) MCG/ACT inhaler Inhale 2 puffs into the lungs every 6 (six) hours as needed for wheezing. 1 Inhaler 11  . aspirin 81 MG tablet Take 81 mg by mouth daily.    . Calcium Carbonate-Vitamin D (CALCIUM + D PO) Take 1 tablet by mouth 2 (two) times daily.    . clopidogrel (PLAVIX) 75 MG tablet Take 1 tablet (75 mg total) by mouth daily. 90 tablet 3  . diclofenac sodium (VOLTAREN) 1 % GEL Apply 2 g topically 3 (three) times daily as needed (pain).    . fluticasone (FLONASE) 50 MCG/ACT nasal spray Place 2 sprays into the nose 2 (two) times daily as needed for allergies. For congestion     . hydrochlorothiazide (HYDRODIURIL) 25 MG tablet Take 1 tablet (25 mg total) by mouth every morning. 90 tablet 3  . HYDROcodone-acetaminophen (NORCO/VICODIN) 5-325 MG tablet Take 1 tablet by mouth 2 (two) times daily as needed. (Patient taking differently: Take 1 tablet by mouth 2 (two) times daily as needed for moderate pain. ) 40 tablet 0  .  HYDROcodone-homatropine (HYCODAN) 5-1.5 MG/5ML syrup Take 5 mLs by mouth every 8 (eight) hours as needed for cough. 120 mL 0  . isosorbide mononitrate (IMDUR) 60 MG 24 hr tablet Take 1 tablet (60 mg total) by mouth daily. 90 tablet 3  . Krill Oil 1000 MG CAPS Take 1,000 mg by mouth daily.    Marland Kitchen losartan (COZAAR) 100 MG tablet TAKE 1 TABLET (100 MG TOTAL) BY MOUTH DAILY. 90 tablet 3  . montelukast (SINGULAIR) 10 MG tablet TAKE 1 TABLET BY MOUTH AT BEDTIME 30 tablet 2  . mupirocin ointment (BACTROBAN) 2 % Place 1 application into the nose daily. Via qtip to ear daily (Patient taking differently: Place 1 application  into the nose daily as needed (irritation). Via qtip to ear daily) 22 g 0  . nitroGLYCERIN (NITROSTAT) 0.4 MG SL tablet Place 1 tablet (0.4 mg total) under the tongue every 5 (five) minutes as needed for chest pain. 25 tablet 2  . pantoprazole (PROTONIX) 40 MG tablet TAKE 1 TABLET (40 MG TOTAL) BY MOUTH DAILY. 90 tablet 3  . Polyethyl Glycol-Propyl Glycol (SYSTANE OP) Place 1 drop into both eyes daily as needed (dry eyes).    . potassium chloride (K-DUR) 10 MEQ tablet Take 1 tablet (10 mEq total) by mouth daily as needed. (Patient taking differently: Take 10 mEq by mouth daily as needed (supplement). ) 90 tablet 3  . rosuvastatin (CRESTOR) 40 MG tablet Take 1 tablet (40 mg total) by mouth daily. 90 tablet 3  . sertraline (ZOLOFT) 50 MG tablet TAKE 1 TABLET EVERY DAY **NEED OFFICE VISIT 90 tablet 1  . Spacer/Aero-Holding Chambers DEVI 1 spacer to use prn with HFA device as directed 1 each 0  . SYMBICORT 160-4.5 MCG/ACT inhaler INHALE 2 PUFFS INTO THE LUNGS 2 (TWO) TIMES DAILY. 10.2 Inhaler 4  . tiotropium (SPIRIVA) 18 MCG inhalation capsule Place 1 capsule (18 mcg total) into inhaler and inhale daily. 30 capsule 11  . triamcinolone ointment (KENALOG) 0.5 % Apply 1 application topically 2 (two) times daily. (Patient taking differently: Apply 1 application topically daily as needed (rash). ) 30 g 2   No current facility-administered medications for this visit.      Allergies:   Ciprofloxacin; Codeine; Erythromycin; Meperidine hcl; Penicillins; Shellfish allergy; and Tape   Social History:  The patient  reports that she quit smoking about 23 years ago. Her smoking use included Cigarettes. She has a 20.00 pack-year smoking history. She has never used smokeless tobacco. She reports that she drinks alcohol. She reports that she does not use drugs.   Family History:   family history includes Alcohol abuse in her brother; Aneurysm in her brother; Cancer in her father and sister; Cirrhosis in her maternal  grandfather; Colon cancer (age of onset: 28) in her father; Diabetes in her maternal grandmother and mother; Heart attack in her brother and brother; Heart attack (age of onset: 26) in her mother; Heart disease in her brother, brother, and father; Hyperlipidemia in her daughter; Hypertension in her brother and father; Hypothyroidism in her sister; Lung cancer in her father and sister; Obesity in her daughter and son; Stroke in her maternal grandmother and paternal grandmother.    Review of Systems: Review of Systems  Constitutional: Negative.   Respiratory: Negative.   Cardiovascular: Negative.   Gastrointestinal: Negative.   Musculoskeletal: Negative.        Left groin pain  Neurological: Negative.   Psychiatric/Behavioral: Negative.   All other systems reviewed and are negative.  PHYSICAL EXAM: VS:  BP 112/70 (BP Location: Left Arm, Patient Position: Sitting, Cuff Size: Normal)   Pulse (!) 58   Ht '5\' 2"'$  (1.575 m)   Wt 140 lb 8 oz (63.7 kg)   BMI 25.70 kg/m  , BMI Body mass index is 25.7 kg/m. GEN: Well nourished, well developed, in no acute distress  HEENT: normal  Neck: no JVD, carotid bruits, or masses Cardiac: RRR; no murmurs, rubs, or gallops,no edema  Respiratory:  clear to auscultation bilaterally, normal work of breathing GI: soft, nontender, nondistended, + BS MS: no deformity or atrophy  Skin: warm and dry, no rash Neuro:  Strength and sensation are intact Psych: euthymic mood, full affect    Recent Labs: 10/23/2015: ALT 9; TSH 3.09 01/03/2016: BUN 12; Creatinine, Ser 0.88; Hemoglobin 13.5; Platelets 148; Potassium 4.0; Sodium 139    Lipid Panel Lab Results  Component Value Date   CHOL 242 (H) 10/23/2015   HDL 33.50 (L) 10/23/2015   LDLCALC 180 (H) 10/23/2015   TRIG 145.0 10/23/2015      Wt Readings from Last 3 Encounters:  01/22/16 140 lb 8 oz (63.7 kg)  01/03/16 142 lb 6.7 oz (64.6 kg)  12/27/15 140 lb 12 oz (63.8 kg)       ASSESSMENT AND  PLAN:   Unstable angina (HCC) -  Dramatic improvement in her symptoms following stent placement 3 as detailed above Recommended she stay on aspirin and Plavix Stressed importance of aggressive cholesterol control, compliance with her Crestor  Carotid stenosis 40-59% disease on the right, status post carotid endarterectomy on the left Repeat carotid ultrasound ordered, last visualized in 2015  Essential hypertension Blood pressure is well controlled on today's visit. No changes made to the medications.  Coronary artery disease involving coronary bypass graft of native heart with angina pectoris with documented spasm (HCC) Dramatic improvement in anginal symptoms as detailed above Recent cardiac catheterization results discussed with her  Hx of CABG As detailed above, recent stent to her vein graft to the RCA  Leg pain Known PAD Previous intervention with stent Last imaging was 4 years ago. We have ordered aorta and iliac, large committee arterial Doppler   Total encounter time more than 25 minutes  Greater than 50% was spent in counseling and coordination of care with the patient    Disposition:   F/U  6 month   Orders Placed This Encounter  Procedures  . Hepatic function panel  . Lipid Profile  . EKG 12-Lead     Signed, Esmond Plants, M.D., Ph.D. 01/22/2016  [place], [place]

## 2016-01-22 NOTE — Patient Instructions (Addendum)
Medication Instructions:   No medication changes made  Labwork:  Please come in for fasting liver and lipids  Testing/Procedures:  We will order an aorta and iliac artery doppler for PAD, stents LE arterial doppler Also we will order a carotid ultrasound for CEA, known 40 to 59% on the right   Follow-Up: It was a pleasure seeing you in the office today. Please call us if you have new issues that need to be addressed before your next appt.  [phone]  Your physician wants you to follow-up in: 6 months.  You will receive a reminder letter in the mail two months in advance. If you don't receive a letter, please call our office to schedule the follow-up appointment.  If you need a refill on your cardiac medications before your next appointment, please call your pharmacy.

## 2016-01-31 ENCOUNTER — Encounter (HOSPITAL_COMMUNITY)
Admission: RE | Admit: 2016-01-31 | Discharge: 2016-01-31 | Disposition: A | Discharge status: Home / Self Care | Payer: PPO | Source: Ambulatory Visit | Attending: Cardiovascular Disease | Admitting: Cardiovascular Disease

## 2016-01-31 VITALS — BP 128/74 | HR 61 | Ht 62.0 in | Wt 144.8 lb

## 2016-01-31 DIAGNOSIS — Z955 Presence of coronary angioplasty implant and graft: Secondary | ICD-10-CM | POA: Insufficient documentation

## 2016-01-31 DIAGNOSIS — J449 Chronic obstructive pulmonary disease, unspecified: Secondary | ICD-10-CM

## 2016-01-31 NOTE — Progress Notes (Signed)
Cardiac Rehab Medication Review by a Pharmacist  Does the patient  feel that his/her medications are working for him/her?  yes  Has the patient been experiencing any side effects to the medications prescribed?  no  Does the patient measure his/her own blood pressure or blood glucose at home?  yes   Does the patient have any problems obtaining medications due to transportation or finances?   no  Understanding of regimen: good Understanding of indications: good Potential of compliance: good    Pharmacist comments: 53 YOF presenting for orientation to cardiac rehab. The patient doesn't use help from anyone else to take her own medications and she is a good historian. She feels that her medications are working for her except her singulair. She doesn't think it is helping her and isn't sure why she needs. I counseled her on what the medication is suppose to do and recommended she discuss it further with her doctor if she wants to stop it. She checks her BP at home and it is running in the 120s/80s. She doesn't have problems paying for her medications, but doesn't like how expensive her inhalers her, especially her Spiriva.   Myer Peer Grayland Ormond), PharmD  [id number] Pharmacy Resident Pager: [phone] 01/31/2016 8:42 AM

## 2016-02-01 ENCOUNTER — Encounter (HOSPITAL_COMMUNITY): Payer: Self-pay

## 2016-02-01 NOTE — Progress Notes (Signed)
Cardiac Individual Treatment Plan  Patient Details  Name: Andrea [name] MRN: [id number] Date of Birth: 1941/03/07 Referring Provider:   Flowsheet Row CARDIAC REHAB PHASE II ORIENTATION from 01/31/2016 in [place]  Referring Provider  Andrea Sacramento MD      Initial Encounter Date:  Andrea [name] PHASE II ORIENTATION from 01/31/2016 in [place]  Date  01/31/16  Referring Provider  Andrea Sacramento MD      Visit Diagnosis:  01/02/16 Stented coronary artery  COPD, moderate ([place])  Patient's Home Medications on Admission:  Current Outpatient Prescriptions:  .  acetaminophen (TYLENOL) 650 MG CR tablet, Take 1,300 mg by mouth 2 (two) times daily., Disp: , Rfl:  .  albuterol (PROVENTIL HFA;VENTOLIN HFA) 108 (90 Base) MCG/ACT inhaler, Inhale 2 puffs into the lungs every 6 (six) hours as needed for wheezing., Disp: 1 Inhaler, Rfl: 11 .  aspirin 81 MG tablet, Take 81 mg by mouth daily., Disp: , Rfl:  .  Calcium Carbonate-Vitamin D (CALCIUM + D PO), Take 1 tablet by mouth 2 (two) times daily., Disp: , Rfl:  .  clopidogrel (PLAVIX) 75 MG tablet, Take 1 tablet (75 mg total) by mouth daily., Disp: 90 tablet, Rfl: 3 .  fluticasone (FLONASE) 50 MCG/ACT nasal spray, Place 2 sprays into the nose 2 (two) times daily as needed for allergies. For congestion , Disp: , Rfl:  .  hydrochlorothiazide (HYDRODIURIL) 25 MG tablet, Take 1 tablet (25 mg total) by mouth every morning., Disp: 90 tablet, Rfl: 3 .  isosorbide mononitrate (IMDUR) 60 MG 24 hr tablet, Take 1 tablet (60 mg total) by mouth daily., Disp: 90 tablet, Rfl: 3 .  Krill Oil 1000 MG CAPS, Take 1,000 mg by mouth daily., Disp: , Rfl:  .  losartan (COZAAR) 100 MG tablet, TAKE 1 TABLET (100 MG TOTAL) BY MOUTH DAILY., Disp: 90 tablet, Rfl: 3 .  montelukast (SINGULAIR) 10 MG tablet, TAKE 1 TABLET BY MOUTH AT BEDTIME, Disp: 30 tablet, Rfl: 2 .  nitroGLYCERIN (NITROSTAT) 0.4 MG  SL tablet, Place 1 tablet (0.4 mg total) under the tongue every 5 (five) minutes as needed for chest pain., Disp: 25 tablet, Rfl: 2 .  pantoprazole (PROTONIX) 40 MG tablet, TAKE 1 TABLET (40 MG TOTAL) BY MOUTH DAILY., Disp: 90 tablet, Rfl: 3 .  Polyethyl Glycol-Propyl Glycol (SYSTANE OP), Place 1 drop into both eyes daily as needed (dry eyes)., Disp: , Rfl:  .  rosuvastatin (CRESTOR) 40 MG tablet, Take 1 tablet (40 mg total) by mouth daily., Disp: 90 tablet, Rfl: 3 .  sertraline (ZOLOFT) 50 MG tablet, TAKE 1 TABLET EVERY DAY **NEED OFFICE VISIT, Disp: 90 tablet, Rfl: 1 .  Spacer/Aero-Holding Chambers DEVI, 1 spacer to use prn with HFA device as directed, Disp: 1 each, Rfl: 0 .  SYMBICORT 160-4.5 MCG/ACT inhaler, INHALE 2 PUFFS INTO THE LUNGS 2 (TWO) TIMES DAILY., Disp: 10.2 Inhaler, Rfl: 4 .  tiotropium (SPIRIVA) 18 MCG inhalation capsule, Place 1 capsule (18 mcg total) into inhaler and inhale daily., Disp: 30 capsule, Rfl: 11 .  diclofenac sodium (VOLTAREN) 1 % GEL, Apply 2 g topically 3 (three) times daily as needed (pain)., Disp: , Rfl:  .  HYDROcodone-acetaminophen (NORCO/VICODIN) 5-325 MG tablet, Take 1 tablet by mouth 2 (two) times daily as needed. (Patient not taking: Reported on 01/31/2016), Disp: 40 tablet, Rfl: 0 .  HYDROcodone-homatropine (HYCODAN) 5-1.5 MG/5ML syrup, Take 5 mLs by mouth every 8 (eight) hours as needed  for cough. (Patient not taking: Reported on 01/31/2016), Disp: 120 mL, Rfl: 0 .  mupirocin ointment (BACTROBAN) 2 %, Place 1 application into the nose daily. Via qtip to ear daily (Patient not taking: Reported on 01/31/2016), Disp: 22 g, Rfl: 0 .  potassium chloride (K-DUR) 10 MEQ tablet, Take 1 tablet (10 mEq total) by mouth daily as needed. (Patient not taking: Reported on 01/31/2016), Disp: 90 tablet, Rfl: 3 .  triamcinolone ointment (KENALOG) 0.5 %, Apply 1 application topically 2 (two) times daily. (Patient not taking: Reported on 01/31/2016), Disp: 30 g, Rfl: 2  Past Medical  History: Past Medical History:  Diagnosis Date  . AAA (abdominal aortic aneurysm) ([place]) 01/2009   AAA (2.8 x 3.0)  moderate RAS (left); 2.7 x 2.7 cm (07/11/05)  . Acute bronchitis 03/20/2013; 2017  . Angina   . Anosmia   . Asthma   . Baker's cyst of knee 01/30/2014  . Basal cell carcinoma    "back and left arm"  . Bradycardia    Metoprolol stopped 08/2011  . CAD (coronary artery disease)    s/CABG (reports IMA and 2 SVGs) back in 1999  Myoview normal 3/10; s/p PCI with DES to PL branch of distal RCA 09/2011; PCI +DES to SVG-RCA, PCI + DES to mid LCx 12/2015  . Cerebrovascular disease 01/2009   carotid u/s  R 0-39%   L 60-79%  . Chronic thoracic back pain   . COPD (chronic obstructive pulmonary disease) ([place])   . Depression   . Dizziness   . Dysrhythmia    hx of sinus brady  . GERD (gastroesophageal reflux disease)   . Heart murmur   . Herniated lumbar disc without myelopathy   . History of blood transfusion 1999   "when I had the bypass; had a PE"  . Hyperglycemia 10/23/2015  . Hyperlipidemia   . Hypertension   . Medicare annual wellness visit, subsequent 07/31/2013  . NSTEMI (non-ST elevated myocardial infarction) ([place]) 11/12   Cath showed atretic IMA graft to the LAD, SVG to PD was patent but the continuation of this graft to the PL branch was occluded; there were L to R collaterals; Mid LAD had a 60 to 70% stenosis. She has been treated medically.  Neg Myoview 05/2011  . Osteoarthritis of back   . Osteoporosis   . Pneumonia "several times"  . Pulmonary embolism ([place]) 1999   "after my bypass"  . PVD (peripheral vascular disease) ([place])   . Shortness of breath   . 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 Former Smoker  . Packs/day: 0.50  . Years: 40.00  . Types: Cigarettes  . Quit date: 01/19/1993  Smokeless Tobacco  . Never Used    Labs: Recent Review Flowsheet Data    Labs for ITP Cardiac and Pulmonary Rehab Latest Ref Rng & Units  03/17/2013 07/26/2013 01/24/2014 02/08/2015 10/23/2015   Cholestrol 0 - 200 mg/dL 306(H) 135 136 144 242(H)   LDLCALC 0 - 99 mg/dL 199(H) 61 75 - 180(H)   LDLDIRECT mg/dL - - - 83.0 -   HDL >39.00 mg/dL 34(L) 31(L) 34.50(L) 30.90(L) 33.50(L)   Trlycerides 0.0 - 149.0 mg/dL 364(H) 214(H) 134.0 330.0(H) 145.0   Hemoglobin A1c 4.6 - 6.5 % - - - 6.1 6.3   TCO2 0 - 100 mmol/L - - - - -      Capillary Blood Glucose: No results found for: GLUCAP   Exercise  Target Goals: Date: 01/31/16  Exercise Program Goal: Individual exercise prescription set with THRR, safety & activity barriers. Participant demonstrates ability to understand and report RPE using BORG scale, to self-measure pulse accurately, and to acknowledge the importance of the exercise prescription.  Exercise Prescription Goal: Starting with aerobic activity 30 plus minutes a day, 3 days per week for initial exercise prescription. Provide home exercise prescription and guidelines that participant acknowledges understanding prior to discharge.  Activity Barriers & Risk Stratification:     Activity Barriers & Cardiac Risk Stratification - 01/31/16 0911      Activity Barriers & Cardiac Risk Stratification   Activity Barriers Back Problems;Shortness of Breath;Deconditioning   Cardiac Risk Stratification High      6 Minute Walk:     6 Minute Walk    Row Name 01/31/16 1146 01/31/16 1205 01/31/16 1225     6 Minute Walk   Phase  - Initial Initial   Distance 1704 feet  -  -   Walk Time 6 minutes  -  -   # of Rest Breaks 0  - 0   MPH 3.23  -  -   METS 3.46  -  -   RPE 13  -  -   Perceived Dyspnea  2  -  -   VO2 Peak 12.11  -  -   Symptoms Yes (comment)  -  -   Comments mild SOB: 2  -  -   Resting HR 61 bpm  -  -   Resting BP 128/74  -  -   Max Ex. HR 100 bpm  -  -   Max Ex. BP 162/90  -  -   2 Minute Post BP 130/78  -  -      Initial Exercise Prescription:     Initial Exercise Prescription - 01/31/16 1100      Date  of Initial Exercise RX and Referring Provider   Date 01/31/16   Referring Provider Andrea Sacramento MD     Treadmill   MPH 2   Grade 1   Minutes 10   METs 2.81     Bike   Level 0.5   Minutes 10   METs 2.45     NuStep   Level 3   Watts 40   Minutes 10   METs 2     Intensity   THRR 40-80% of Max Heartrate 57-114   Ratings of Perceived Exertion 11-13   Perceived Dyspnea 0-4     Progression   Progression Continue to progress workloads to maintain intensity without signs/symptoms of physical distress.     Resistance Training   Training Prescription Yes   Weight 2   Reps 10-12      Perform Capillary Blood Glucose checks as needed.  Exercise Prescription Changes:   Exercise Comments:   Discharge Exercise Prescription (Final Exercise Prescription Changes):   Nutrition:  Target Goals: Understanding of nutrition guidelines, daily intake of sodium '1500mg'$ , cholesterol '200mg'$ , calories 30% from fat and 7% or less from saturated fats, daily to have 5 or more servings of fruits and vegetables.  Biometrics:     Pre Biometrics - 01/31/16 1204      Pre Biometrics   Triceps Skinfold 21 mm   % Body Fat 26.8 %   Grip Strength 26 kg   Flexibility 9 in   Single Leg Stand 27.09 seconds       Nutrition Therapy Plan and Nutrition Goals:   Nutrition  Discharge: Nutrition Scores:     Nutrition Assessments - 02/01/16 1025      MEDFICTS Scores   Pre Score 9      Nutrition Goals Re-Evaluation:   Psychosocial: Target Goals: Acknowledge presence or absence of depression, maximize coping skills, provide positive support system. Participant is able to verbalize types and ability to use techniques and skills needed for reducing stress and depression.  Initial Review & Psychosocial Screening:     Initial Psych Review & Screening - 02/01/16 1358      Family Dynamics   Good Support System? Yes      Quality of Life Scores:     Quality of Life - 01/31/16 1202       Quality of Life Scores   Health/Function Pre 27.27 %   Socioeconomic Pre 29.29 %   Psych/Spiritual Pre 29.14 %   Family Pre 27 %   GLOBAL Pre 28.03 %      PHQ-9: Recent Review Flowsheet Data    Depression screen [facility] 2/9 08/28/2015 02/08/2015 07/31/2013 07/26/2013   Decreased Interest 0 0 0 0   Down, Depressed, Hopeless 1 0 0 0   PHQ - 2 Score 1 0 0 0   Altered sleeping 1 0 - -   Tired, decreased energy 2 0 - -   Change in appetite 1 0 - -   Feeling bad or failure about yourself  0 0 - -   Trouble concentrating 1 0 - -   Moving slowly or fidgety/restless 0 0 - -   Suicidal thoughts 0 0 - -   PHQ-9 Score 6 0 - -   Difficult doing work/chores Somewhat difficult Not difficult at all - -      Psychosocial Evaluation and Intervention:   Psychosocial Re-Evaluation:   Vocational Rehabilitation: Provide vocational rehab assistance to qualifying candidates.   Vocational Rehab Evaluation & Intervention:   Education: Education Goals: Education classes will be provided on a weekly basis, covering required topics. Participant will state understanding/return demonstration of topics presented.  Learning Barriers/Preferences:     Learning Barriers/Preferences - 01/31/16 0910      Learning Barriers/Preferences   Learning Barriers Sight  reading glasses   Learning Preferences Group Instruction;Individual Instruction;Pictoral;Skilled Demonstration;Verbal Instruction;Video;Written Material      Education Topics: Count Your Pulse:  -Group instruction provided by verbal instruction, demonstration, patient participation and written materials to support subject.  Instructors address importance of being able to find your pulse and how to count your pulse when at home without a heart monitor.  Patients get hands on experience counting their pulse with staff help and individually.   Heart Attack, Angina, and Risk Factor Modification:  -Group instruction provided by verbal instruction,  video, and written materials to support subject.  Instructors address signs and symptoms of angina and heart attacks.    Also discuss risk factors for heart disease and how to make changes to improve heart health risk factors.   Functional Fitness:  -Group instruction provided by verbal instruction, demonstration, patient participation, and written materials to support subject.  Instructors address safety measures for doing things around the house.  Discuss how to get up and down off the floor, how to pick things up properly, how to safely get out of a chair without assistance, and balance training.   Meditation and Mindfulness:  -Group instruction provided by verbal instruction, patient participation, and written materials to support subject.  Instructor addresses importance of mindfulness and meditation practice to help reduce stress  and improve awareness.  Instructor also leads participants through a meditation exercise.    Stretching for Flexibility and Mobility:  -Group instruction provided by verbal instruction, patient participation, and written materials to support subject.  Instructors lead participants through series of stretches that are designed to increase flexibility thus improving mobility.  These stretches are additional exercise for major muscle groups that are typically performed during regular warm up and cool down.   Hands Only CPR Anytime:  -Group instruction provided by verbal instruction, video, patient participation and written materials to support subject.  Instructors co-teach with AHA video for hands only CPR.  Participants get hands on experience with mannequins.   Nutrition I class: Heart Healthy Eating:  -Group instruction provided by PowerPoint slides, verbal discussion, and written materials to support subject matter. The instructor gives an explanation and review of the Therapeutic Lifestyle Changes diet recommendations, which includes a discussion on lipid goals,  dietary fat, sodium, fiber, plant stanol/sterol esters, sugar, and the components of a well-balanced, healthy diet.   Nutrition II class: Lifestyle Skills:  -Group instruction provided by PowerPoint slides, verbal discussion, and written materials to support subject matter. The instructor gives an explanation and review of label reading, grocery shopping for heart health, heart healthy recipe modifications, and ways to make healthier choices when eating out.   Diabetes Question & Answer:  -Group instruction provided by PowerPoint slides, verbal discussion, and written materials to support subject matter. The instructor gives an explanation and review of diabetes co-morbidities, pre- and post-prandial blood glucose goals, pre-exercise blood glucose goals, signs, symptoms, and treatment of hypoglycemia and hyperglycemia, and foot care basics.   Diabetes Blitz:  -Group instruction provided by PowerPoint slides, verbal discussion, and written materials to support subject matter. The instructor gives an explanation and review of the physiology behind type 1 and type 2 diabetes, diabetes medications and rational behind using different medications, pre- and post-prandial blood glucose recommendations and Hemoglobin A1c goals, diabetes diet, and exercise including blood glucose guidelines for exercising safely.    Portion Distortion:  -Group instruction provided by PowerPoint slides, verbal discussion, written materials, and food models to support subject matter. The instructor gives an explanation of serving size versus portion size, changes in portions sizes over the last 20 years, and what consists of a serving from each food group.   Stress Management:  -Group instruction provided by verbal instruction, video, and written materials to support subject matter.  Instructors review role of stress in heart disease and how to cope with stress positively.     Exercising on Your Own:  -Group instruction  provided by verbal instruction, power point, and written materials to support subject.  Instructors discuss benefits of exercise, components of exercise, frequency and intensity of exercise, and end points for exercise.  Also discuss use of nitroglycerin and activating EMS.  Review options of places to exercise outside of rehab.  Review guidelines for sex with heart disease.   Cardiac Drugs I:  -Group instruction provided by verbal instruction and written materials to support subject.  Instructor reviews cardiac drug classes: antiplatelets, anticoagulants, beta blockers, and statins.  Instructor discusses reasons, side effects, and lifestyle considerations for each drug class.   Cardiac Drugs II:  -Group instruction provided by verbal instruction and written materials to support subject.  Instructor reviews cardiac drug classes: angiotensin converting enzyme inhibitors (ACE-I), angiotensin II receptor blockers (ARBs), nitrates, and calcium channel blockers.  Instructor discusses reasons, side effects, and lifestyle considerations for each drug class.  Anatomy and Physiology of the Circulatory System:  -Group instruction provided by verbal instruction, video, and written materials to support subject.  Reviews functional anatomy of heart, how it relates to various diagnoses, and what role the heart plays in the overall system.   Knowledge Questionnaire Score:     Knowledge Questionnaire Score - 01/31/16 1145      Knowledge Questionnaire Score   Pre Score 19/24      Core Components/Risk Factors/Patient Goals at Admission:     Personal Goals and Risk Factors at Admission - 01/31/16 0913      Core Components/Risk Factors/Patient Goals on Admission    Weight Management Yes;Weight Loss   Intervention Weight Management: Develop a combined nutrition and exercise program designed to reach desired caloric intake, while maintaining appropriate intake of nutrient and fiber, sodium and fats, and  appropriate energy expenditure required for the weight goal.;Weight Management: Provide education and appropriate resources to help participant work on and attain dietary goals.;Weight Management/Obesity: Establish reasonable short term and long term weight goals.   Expected Outcomes Short Term: Continue to assess and modify interventions until short term weight is achieved;Long Term: Adherence to nutrition and physical activity/exercise program aimed toward attainment of established weight goal;Weight Maintenance: Understanding of the daily nutrition guidelines, which includes 25-35% calories from fat, 7% or less cal from saturated fats, less than '200mg'$  cholesterol, less than 1.5gm of sodium, & 5 or more servings of fruits and vegetables daily;Weight Loss: Understanding of general recommendations for a balanced deficit meal plan, which promotes 1-2 lb weight loss per week and includes a negative energy balance of [phone] kcal/d;Understanding recommendations for meals to include 15-35% energy as protein, 25-35% energy from fat, 35-60% energy from carbohydrates, less than '200mg'$  of dietary cholesterol, 20-35 gm of total fiber daily;Understanding of distribution of calorie intake throughout the day with the consumption of 4-5 meals/snacks   Increase Strength and Stamina Yes   Intervention Provide advice, education, support and counseling about physical activity/exercise needs.;Develop an individualized exercise prescription for aerobic and resistive training based on initial evaluation findings, risk stratification, comorbidities and participant's personal goals.   Expected Outcomes Achievement of increased cardiorespiratory fitness and enhanced flexibility, muscular endurance and strength shown through measurements of functional capacity and personal statement of participant.   Intervention Provide education, individualized exercise plan and daily activity instruction to help decrease symptoms of SOB with  activities of daily living.   Expected Outcomes Short Term: Achieves a reduction of symptoms when performing activities of daily living.   Hypertension Yes   Intervention Provide education on lifestyle modifcations including regular physical activity/exercise, weight management, moderate sodium restriction and increased consumption of fresh fruit, vegetables, and low fat dairy, alcohol moderation, and smoking cessation.;Monitor prescription use compliance.   Personal Goal Other Yes   Personal Goal Learn limitations and be able to function at fullest capacity   Intervention Provide education on the benefits of exercise and increase knowledge and awareness on cardiac disease   Expected Outcomes Pt will be able to live knowing limitations and be able to function at fullest capacity      Core Components/Risk Factors/Patient Goals Review:    Core Components/Risk Factors/Patient Goals at Discharge (Final Review):    ITP Comments:     ITP Comments    Row Name 01/31/16 0909           ITP Comments Dr. Fransico Him, Medical Director          Comments: Pt in for orientation for cardiac  rehab from 0800-1000.  As a part of cardiac rehab orientation, pt completed 6 minute walk test.  Pt tolerated well with no complaints of cp or sob.  Monitor showed SR with T wave inversion.  Pt previously participated in pulmonary rehab at [facility].  Pt plans to attend cardiac rehab on Wednesdays for the month of September then she will be able to add a second day Monday.  Pt is looking forward to participating in cardiac rehab. Cherre Huger, BSN

## 2016-02-04 ENCOUNTER — Other Ambulatory Visit: Payer: Self-pay | Admitting: *Deleted

## 2016-02-04 ENCOUNTER — Encounter (HOSPITAL_COMMUNITY): Payer: PPO

## 2016-02-04 ENCOUNTER — Encounter (HOSPITAL_COMMUNITY): Payer: Self-pay | Admitting: *Deleted

## 2016-02-04 ENCOUNTER — Encounter: Payer: Self-pay | Admitting: *Deleted

## 2016-02-04 NOTE — Progress Notes (Signed)
Incomplete Session Note  Patient Details  Name: Crystalyn Delia MRN: [id number] Date of Birth: 10-19-1940 Referring Provider:   Flowsheet Row CARDIAC REHAB PHASE II ORIENTATION from 01/31/2016 in [place]  Referring Provider  Kathlyn Sacramento MD      [name] did not complete her rehab session.  We were not able to check Deondra in to exercise today because she has an appointment with [facility]. Medications reviewed. Tenzin plans to return to exercise next Wednesday due to other appointment obligations.[name], RN,BSN 02/04/2016 5:02 PM

## 2016-02-04 NOTE — Progress Notes (Signed)
[facility] study month 1 visit completed. Patient stopped ASA on 02/02/16 as instructed at discharge. She denies any angina and states her medication has not changed since discharge. Copy of her study required visit were given to patient at request. EKG obtained.

## 2016-02-05 DIAGNOSIS — H918X1 Other specified hearing loss, right ear: Secondary | ICD-10-CM | POA: Diagnosis not present

## 2016-02-05 DIAGNOSIS — H903 Sensorineural hearing loss, bilateral: Secondary | ICD-10-CM | POA: Diagnosis not present

## 2016-02-06 ENCOUNTER — Encounter (HOSPITAL_COMMUNITY): Payer: Self-pay

## 2016-02-06 ENCOUNTER — Encounter (HOSPITAL_COMMUNITY): Admission: RE | Admit: 2016-02-06 | Payer: PPO | Source: Ambulatory Visit

## 2016-02-07 ENCOUNTER — Encounter (HOSPITAL_COMMUNITY): Payer: Self-pay | Admitting: *Deleted

## 2016-02-07 DIAGNOSIS — Z955 Presence of coronary angioplasty implant and graft: Secondary | ICD-10-CM

## 2016-02-07 NOTE — Progress Notes (Signed)
Cardiac Individual Treatment Plan  Patient Details  Name: Andrea [name] MRN: [id number] Date of Birth: 11-07-1940 Referring Provider:   Flowsheet Row CARDIAC REHAB PHASE II ORIENTATION from 01/31/2016 in [facility]  Referring Provider  Kathlyn Sacramento MD      Initial Encounter Date:  [place] from 01/31/2016 in [facility]  Date  01/31/16  Referring Provider  Kathlyn Sacramento MD      Visit Diagnosis:  01/02/16 Stented coronary artery  Patient's Home Medications on Admission:  Current Outpatient Prescriptions:  .  acetaminophen (TYLENOL) 650 MG CR tablet, Take 1,300 mg by mouth 2 (two) times daily., Disp: , Rfl:  .  albuterol (PROVENTIL HFA;VENTOLIN HFA) 108 (90 Base) MCG/ACT inhaler, Inhale 2 puffs into the lungs every 6 (six) hours as needed for wheezing., Disp: 1 Inhaler, Rfl: 11 .  Calcium Carbonate-Vitamin D (CALCIUM + D PO), Take 1 tablet by mouth 2 (two) times daily., Disp: , Rfl:  .  clopidogrel (PLAVIX) 75 MG tablet, Take 1 tablet (75 mg total) by mouth daily., Disp: 90 tablet, Rfl: 3 .  diclofenac sodium (VOLTAREN) 1 % GEL, Apply 2 g topically 3 (three) times daily as needed (pain)., Disp: , Rfl:  .  fluticasone (FLONASE) 50 MCG/ACT nasal spray, Place 2 sprays into the nose 2 (two) times daily as needed for allergies. For congestion , Disp: , Rfl:  .  hydrochlorothiazide (HYDRODIURIL) 25 MG tablet, Take 1 tablet (25 mg total) by mouth every morning., Disp: 90 tablet, Rfl: 3 .  HYDROcodone-acetaminophen (NORCO/VICODIN) 5-325 MG tablet, Take 1 tablet by mouth 2 (two) times daily as needed. (Patient not taking: Reported on 01/31/2016), Disp: 40 tablet, Rfl: 0 .  HYDROcodone-homatropine (HYCODAN) 5-1.5 MG/5ML syrup, Take 5 mLs by mouth every 8 (eight) hours as needed for cough. (Patient not taking: Reported on 01/31/2016), Disp: 120 mL, Rfl: 0 .  isosorbide mononitrate (IMDUR) 60 MG 24 hr  tablet, Take 1 tablet (60 mg total) by mouth daily., Disp: 90 tablet, Rfl: 3 .  Krill Oil 1000 MG CAPS, Take 1,000 mg by mouth daily., Disp: , Rfl:  .  losartan (COZAAR) 100 MG tablet, TAKE 1 TABLET (100 MG TOTAL) BY MOUTH DAILY., Disp: 90 tablet, Rfl: 3 .  montelukast (SINGULAIR) 10 MG tablet, TAKE 1 TABLET BY MOUTH AT BEDTIME, Disp: 30 tablet, Rfl: 2 .  nitroGLYCERIN (NITROSTAT) 0.4 MG SL tablet, Place 1 tablet (0.4 mg total) under the tongue every 5 (five) minutes as needed for chest pain., Disp: 25 tablet, Rfl: 2 .  pantoprazole (PROTONIX) 40 MG tablet, TAKE 1 TABLET (40 MG TOTAL) BY MOUTH DAILY., Disp: 90 tablet, Rfl: 3 .  Polyethyl Glycol-Propyl Glycol (SYSTANE OP), Place 1 drop into both eyes daily as needed (dry eyes)., Disp: , Rfl:  .  potassium chloride (K-DUR) 10 MEQ tablet, Take 1 tablet (10 mEq total) by mouth daily as needed. (Patient not taking: Reported on 01/31/2016), Disp: 90 tablet, Rfl: 3 .  rosuvastatin (CRESTOR) 40 MG tablet, Take 1 tablet (40 mg total) by mouth daily., Disp: 90 tablet, Rfl: 3 .  sertraline (ZOLOFT) 50 MG tablet, TAKE 1 TABLET EVERY DAY **NEED OFFICE VISIT, Disp: 90 tablet, Rfl: 1 .  Spacer/Aero-Holding Chambers DEVI, 1 spacer to use prn with HFA device as directed, Disp: 1 each, Rfl: 0 .  SYMBICORT 160-4.5 MCG/ACT inhaler, INHALE 2 PUFFS INTO THE LUNGS 2 (TWO) TIMES DAILY., Disp: 10.2 Inhaler, Rfl: 4 .  tiotropium (SPIRIVA) 18 MCG inhalation capsule, Place 1 capsule (18 mcg total) into inhaler and inhale daily., Disp: 30 capsule, Rfl: 11  Past Medical History: Past Medical History:  Diagnosis Date  . AAA (abdominal aortic aneurysm) ([place]) 01/2009   AAA (2.8 x 3.0)  moderate RAS (left); 2.7 x 2.7 cm (07/11/05)  . Acute bronchitis 03/20/2013; 2017  . Angina   . Anosmia   . Asthma   . Baker's cyst of knee 01/30/2014  . Basal cell carcinoma    "back and left arm"  . Bradycardia    Metoprolol stopped 08/2011  . CAD (coronary artery disease)    s/CABG (reports  IMA and 2 SVGs) back in 1999  Myoview normal 3/10; s/p PCI with DES to PL branch of distal RCA 09/2011; PCI +DES to SVG-RCA, PCI + DES to mid LCx 12/2015  . Cerebrovascular disease 01/2009   carotid u/s  R 0-39%   L 60-79%  . Chronic thoracic back pain   . COPD (chronic obstructive pulmonary disease) ([place])   . Depression   . Dizziness   . Dysrhythmia    hx of sinus brady  . GERD (gastroesophageal reflux disease)   . Heart murmur   . Herniated lumbar disc without myelopathy   . History of blood transfusion 1999   "when I had the bypass; had a PE"  . Hyperglycemia 10/23/2015  . Hyperlipidemia   . Hypertension   . Medicare annual wellness visit, subsequent 07/31/2013  . NSTEMI (non-ST elevated myocardial infarction) ([place]) 11/12   Cath showed atretic IMA graft to the LAD, SVG to PD was patent but the continuation of this graft to the PL branch was occluded; there were L to R collaterals; Mid LAD had a 60 to 70% stenosis. She has been treated medically.  Neg Myoview 05/2011  . Osteoarthritis of back   . Osteoporosis   . Pneumonia "several times"  . Pulmonary embolism ([place]) 1999   "after my bypass"  . PVD (peripheral vascular disease) ([place])   . Shortness of breath   . 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 Former Smoker  . Packs/day: 0.50  . Years: 40.00  . Types: Cigarettes  . Quit date: 01/19/1993  Smokeless Tobacco  . Never Used    Labs: Recent Review Flowsheet Data    Labs for ITP Cardiac and Pulmonary Rehab Latest Ref Rng & Units 03/17/2013 07/26/2013 01/24/2014 02/08/2015 10/23/2015   Cholestrol 0 - 200 mg/dL 306(H) 135 136 144 242(H)   LDLCALC 0 - 99 mg/dL 199(H) 61 75 - 180(H)   LDLDIRECT mg/dL - - - 83.0 -   HDL >39.00 mg/dL 34(L) 31(L) 34.50(L) 30.90(L) 33.50(L)   Trlycerides 0.0 - 149.0 mg/dL 364(H) 214(H) 134.0 330.0(H) 145.0   Hemoglobin A1c 4.6 - 6.5 % - - - 6.1 6.3   TCO2 0 - 100 mmol/L - - - - -      Capillary Blood  Glucose: No results found for: GLUCAP   Exercise Target Goals:    Exercise Program Goal: Individual exercise prescription set with THRR, safety & activity barriers. Participant demonstrates ability to understand and report RPE using BORG scale, to self-measure pulse accurately, and to acknowledge the importance of the exercise prescription.  Exercise Prescription Goal: Starting with aerobic activity 30 plus minutes a day, 3 days per week for initial exercise prescription. Provide home exercise prescription and guidelines that participant acknowledges understanding prior to discharge.  Activity Barriers & Risk Stratification:     Activity Barriers & Cardiac Risk Stratification - 01/31/16 0911      Activity Barriers & Cardiac Risk Stratification   Activity Barriers Back Problems;Shortness of Breath;Deconditioning   Cardiac Risk Stratification High      6 Minute Walk:     6 Minute Walk    Row Name 01/31/16 1146 01/31/16 1205 01/31/16 1225     6 Minute Walk   Phase  - Initial Initial   Distance 1704 feet  -  -   Walk Time 6 minutes  -  -   # of Rest Breaks 0  - 0   MPH 3.23  -  -   METS 3.46  -  -   RPE 13  -  -   Perceived Dyspnea  2  -  -   VO2 Peak 12.11  -  -   Symptoms Yes (comment)  -  -   Comments mild SOB: 2  -  -   Resting HR 61 bpm  -  -   Resting BP 128/74  -  -   Max Ex. HR 100 bpm  -  -   Max Ex. BP 162/90  -  -   2 Minute Post BP 130/78  -  -      Initial Exercise Prescription:     Initial Exercise Prescription - 01/31/16 1100      Date of Initial Exercise RX and Referring Provider   Date 01/31/16   Referring Provider Kathlyn Sacramento MD     Treadmill   MPH 2   Grade 1   Minutes 10   METs 2.81     Bike   Level 0.5   Minutes 10   METs 2.45     NuStep   Level 3   Watts 40   Minutes 10   METs 2     Intensity   THRR 40-80% of Max Heartrate 57-114   Ratings of Perceived Exertion 11-13   Perceived Dyspnea 0-4     Progression    Progression Continue to progress workloads to maintain intensity without signs/symptoms of physical distress.     Resistance Training   Training Prescription Yes   Weight 2   Reps 10-12      Perform Capillary Blood Glucose checks as needed.  Exercise Prescription Changes:      Exercise Prescription Changes    Row Name 02/08/16 1000             Response to Exercise   Blood Pressure (Admit) 128/74       Blood Pressure (Exercise) 162/90       Blood Pressure (Exit) 130/78       Heart Rate (Admit) 61 bpm       Heart Rate (Exercise) 100 bpm       Heart Rate (Exit) 58 bpm       Oxygen Saturation (Admit) 95 %       Oxygen Saturation (Exercise) 91 %       Oxygen Saturation (Exit) 93 %       Rating of Perceived Exertion (Exercise) 13       Perceived Dyspnea (Exercise) 2       Duration Progress to 30 minutes of continuous aerobic without signs/symptoms of physical distress         Progression   Progression Continue to progress workloads to maintain intensity without signs/symptoms of physical distress.  Average METs 3.5         Resistance Training   Training Prescription Yes       Weight 2       Reps 10-12         Interval Training   Interval Training No         Treadmill   MPH 2       Grade 1       Minutes 10       METs 2.81         Bike   Level 0.5       Minutes 10       METs 2.45         NuStep   Level 3       Watts 40       Minutes 10       METs 2          Exercise Comments:      Exercise Comments    Row Name 02/08/16 1031           Exercise Comments Exercise walk test completed.          Discharge Exercise Prescription (Final Exercise Prescription Changes):     Exercise Prescription Changes - 02/08/16 1000      Response to Exercise   Blood Pressure (Admit) 128/74   Blood Pressure (Exercise) 162/90   Blood Pressure (Exit) 130/78   Heart Rate (Admit) 61 bpm   Heart Rate (Exercise) 100 bpm   Heart Rate (Exit) 58 bpm   Oxygen  Saturation (Admit) 95 %   Oxygen Saturation (Exercise) 91 %   Oxygen Saturation (Exit) 93 %   Rating of Perceived Exertion (Exercise) 13   Perceived Dyspnea (Exercise) 2   Duration Progress to 30 minutes of continuous aerobic without signs/symptoms of physical distress     Progression   Progression Continue to progress workloads to maintain intensity without signs/symptoms of physical distress.   Average METs 3.5     Resistance Training   Training Prescription Yes   Weight 2   Reps 10-12     Interval Training   Interval Training No     Treadmill   MPH 2   Grade 1   Minutes 10   METs 2.81     Bike   Level 0.5   Minutes 10   METs 2.45     NuStep   Level 3   Watts 40   Minutes 10   METs 2      Nutrition:  Target Goals: Understanding of nutrition guidelines, daily intake of sodium '1500mg'$ , cholesterol '200mg'$ , calories 30% from fat and 7% or less from saturated fats, daily to have 5 or more servings of fruits and vegetables.  Biometrics:     Pre Biometrics - 01/31/16 1204      Pre Biometrics   Triceps Skinfold 21 mm   % Body Fat 26.8 %   Grip Strength 26 kg   Flexibility 9 in   Single Leg Stand 27.09 seconds       Nutrition Therapy Plan and Nutrition Goals:   Nutrition Discharge: Nutrition Scores:     Nutrition Assessments - 02/01/16 1025      MEDFICTS Scores   Pre Score 9      Nutrition Goals Re-Evaluation:   Psychosocial: Target Goals: Acknowledge presence or absence of depression, maximize coping skills, provide positive support system. Participant is able to verbalize types and ability to use techniques and skills  needed for reducing stress and depression.  Initial Review & Psychosocial Screening:     Initial Psych Review & Screening - 02/01/16 1358      Family Dynamics   Good Support System? Yes      Quality of Life Scores:     Quality of Life - 01/31/16 1202      Quality of Life Scores   Health/Function Pre 27.27 %    Socioeconomic Pre 29.29 %   Psych/Spiritual Pre 29.14 %   Family Pre 27 %   GLOBAL Pre 28.03 %      PHQ-9: Recent Review Flowsheet Data    Depression screen [facility] 2/9 08/28/2015 02/08/2015 07/31/2013 07/26/2013   Decreased Interest 0 0 0 0   Down, Depressed, Hopeless 1 0 0 0   PHQ - 2 Score 1 0 0 0   Altered sleeping 1 0 - -   Tired, decreased energy 2 0 - -   Change in appetite 1 0 - -   Feeling bad or failure about yourself  0 0 - -   Trouble concentrating 1 0 - -   Moving slowly or fidgety/restless 0 0 - -   Suicidal thoughts 0 0 - -   PHQ-9 Score 6 0 - -   Difficult doing work/chores Somewhat difficult Not difficult at all - -      Psychosocial Evaluation and Intervention:   Psychosocial Re-Evaluation:   Vocational Rehabilitation: Provide vocational rehab assistance to qualifying candidates.   Vocational Rehab Evaluation & Intervention:     Vocational Rehab - 02/01/16 1426      Initial Vocational Rehab Evaluation & Intervention   Assessment shows need for Vocational Rehabilitation No     Discharge Vocational Rehab   Discharge Vocational Rehabilitation Pt does not plan to return to competive employment.      Education: Education Goals: Education classes will be provided on a weekly basis, covering required topics. Participant will state understanding/return demonstration of topics presented.  Learning Barriers/Preferences:     Learning Barriers/Preferences - 01/31/16 0910      Learning Barriers/Preferences   Learning Barriers Sight  reading glasses   Learning Preferences Group Instruction;Individual Instruction;Pictoral;Skilled Demonstration;Verbal Instruction;Video;Written Material      Education Topics: Count Your Pulse:  -Group instruction provided by verbal instruction, demonstration, patient participation and written materials to support subject.  Instructors address importance of being able to find your pulse and how to count your pulse when at home  without a heart monitor.  Patients get hands on experience counting their pulse with staff help and individually.   Heart Attack, Angina, and Risk Factor Modification:  -Group instruction provided by verbal instruction, video, and written materials to support subject.  Instructors address signs and symptoms of angina and heart attacks.    Also discuss risk factors for heart disease and how to make changes to improve heart health risk factors.   Functional Fitness:  -Group instruction provided by verbal instruction, demonstration, patient participation, and written materials to support subject.  Instructors address safety measures for doing things around the house.  Discuss how to get up and down off the floor, how to pick things up properly, how to safely get out of a chair without assistance, and balance training.   Meditation and Mindfulness:  -Group instruction provided by verbal instruction, patient participation, and written materials to support subject.  Instructor addresses importance of mindfulness and meditation practice to help reduce stress and improve awareness.  Instructor also leads participants through a  meditation exercise.    Stretching for Flexibility and Mobility:  -Group instruction provided by verbal instruction, patient participation, and written materials to support subject.  Instructors lead participants through series of stretches that are designed to increase flexibility thus improving mobility.  These stretches are additional exercise for major muscle groups that are typically performed during regular warm up and cool down.   Hands Only CPR Anytime:  -Group instruction provided by verbal instruction, video, patient participation and written materials to support subject.  Instructors co-teach with AHA video for hands only CPR.  Participants get hands on experience with mannequins.   Nutrition I class: Heart Healthy Eating:  -Group instruction provided by PowerPoint  slides, verbal discussion, and written materials to support subject matter. The instructor gives an explanation and review of the Therapeutic Lifestyle Changes diet recommendations, which includes a discussion on lipid goals, dietary fat, sodium, fiber, plant stanol/sterol esters, sugar, and the components of a well-balanced, healthy diet.   Nutrition II class: Lifestyle Skills:  -Group instruction provided by PowerPoint slides, verbal discussion, and written materials to support subject matter. The instructor gives an explanation and review of label reading, grocery shopping for heart health, heart healthy recipe modifications, and ways to make healthier choices when eating out.   Diabetes Question & Answer:  -Group instruction provided by PowerPoint slides, verbal discussion, and written materials to support subject matter. The instructor gives an explanation and review of diabetes co-morbidities, pre- and post-prandial blood glucose goals, pre-exercise blood glucose goals, signs, symptoms, and treatment of hypoglycemia and hyperglycemia, and foot care basics.   Diabetes Blitz:  -Group instruction provided by PowerPoint slides, verbal discussion, and written materials to support subject matter. The instructor gives an explanation and review of the physiology behind type 1 and type 2 diabetes, diabetes medications and rational behind using different medications, pre- and post-prandial blood glucose recommendations and Hemoglobin A1c goals, diabetes diet, and exercise including blood glucose guidelines for exercising safely.    Portion Distortion:  -Group instruction provided by PowerPoint slides, verbal discussion, written materials, and food models to support subject matter. The instructor gives an explanation of serving size versus portion size, changes in portions sizes over the last 20 years, and what consists of a serving from each food group.   Stress Management:  -Group instruction  provided by verbal instruction, video, and written materials to support subject matter.  Instructors review role of stress in heart disease and how to cope with stress positively.     Exercising on Your Own:  -Group instruction provided by verbal instruction, power point, and written materials to support subject.  Instructors discuss benefits of exercise, components of exercise, frequency and intensity of exercise, and end points for exercise.  Also discuss use of nitroglycerin and activating EMS.  Review options of places to exercise outside of rehab.  Review guidelines for sex with heart disease.   Cardiac Drugs I:  -Group instruction provided by verbal instruction and written materials to support subject.  Instructor reviews cardiac drug classes: antiplatelets, anticoagulants, beta blockers, and statins.  Instructor discusses reasons, side effects, and lifestyle considerations for each drug class.   Cardiac Drugs II:  -Group instruction provided by verbal instruction and written materials to support subject.  Instructor reviews cardiac drug classes: angiotensin converting enzyme inhibitors (ACE-I), angiotensin II receptor blockers (ARBs), nitrates, and calcium channel blockers.  Instructor discusses reasons, side effects, and lifestyle considerations for each drug class.   Anatomy and Physiology of the Circulatory System:  -Group  instruction provided by verbal instruction, video, and written materials to support subject.  Reviews functional anatomy of heart, how it relates to various diagnoses, and what role the heart plays in the overall system.   Knowledge Questionnaire Score:     Knowledge Questionnaire Score - 01/31/16 1145      Knowledge Questionnaire Score   Pre Score 19/24      Core Components/Risk Factors/Patient Goals at Admission:     Personal Goals and Risk Factors at Admission - 01/31/16 0913      Core Components/Risk Factors/Patient Goals on Admission    Weight  Management Yes;Weight Loss   Intervention Weight Management: Develop a combined nutrition and exercise program designed to reach desired caloric intake, while maintaining appropriate intake of nutrient and fiber, sodium and fats, and appropriate energy expenditure required for the weight goal.;Weight Management: Provide education and appropriate resources to help participant work on and attain dietary goals.;Weight Management/Obesity: Establish reasonable short term and long term weight goals.   Expected Outcomes Short Term: Continue to assess and modify interventions until short term weight is achieved;Long Term: Adherence to nutrition and physical activity/exercise program aimed toward attainment of established weight goal;Weight Maintenance: Understanding of the daily nutrition guidelines, which includes 25-35% calories from fat, 7% or less cal from saturated fats, less than '200mg'$  cholesterol, less than 1.5gm of sodium, & 5 or more servings of fruits and vegetables daily;Weight Loss: Understanding of general recommendations for a balanced deficit meal plan, which promotes 1-2 lb weight loss per week and includes a negative energy balance of [phone] kcal/d;Understanding recommendations for meals to include 15-35% energy as protein, 25-35% energy from fat, 35-60% energy from carbohydrates, less than '200mg'$  of dietary cholesterol, 20-35 gm of total fiber daily;Understanding of distribution of calorie intake throughout the day with the consumption of 4-5 meals/snacks   Increase Strength and Stamina Yes   Intervention Provide advice, education, support and counseling about physical activity/exercise needs.;Develop an individualized exercise prescription for aerobic and resistive training based on initial evaluation findings, risk stratification, comorbidities and participant's personal goals.   Expected Outcomes Achievement of increased cardiorespiratory fitness and enhanced flexibility, muscular endurance and  strength shown through measurements of functional capacity and personal statement of participant.   Intervention Provide education, individualized exercise plan and daily activity instruction to help decrease symptoms of SOB with activities of daily living.   Expected Outcomes Short Term: Achieves a reduction of symptoms when performing activities of daily living.   Hypertension Yes   Intervention Provide education on lifestyle modifcations including regular physical activity/exercise, weight management, moderate sodium restriction and increased consumption of fresh fruit, vegetables, and low fat dairy, alcohol moderation, and smoking cessation.;Monitor prescription use compliance.   Personal Goal Other Yes   Personal Goal Learn limitations and be able to function at fullest capacity   Intervention Provide education on the benefits of exercise and increase knowledge and awareness on cardiac disease   Expected Outcomes Pt will be able to live knowing limitations and be able to function at fullest capacity      Core Components/Risk Factors/Patient Goals Review:      Goals and Risk Factor Review    Row Name 02/08/16 1032             Core Components/Risk Factors/Patient Goals Review   Personal Goals Review Other       Review Will provide individualized home exercise guidelines to help patient achieve fitness and weight loss goals.       Expected Outcomes Begin  regular aerobic exercise routine to achieve fitness and weight loss goals.          Core Components/Risk Factors/Patient Goals at Discharge (Final Review):      Goals and Risk Factor Review - 02/08/16 1032      Core Components/Risk Factors/Patient Goals Review   Personal Goals Review Other   Review Will provide individualized home exercise guidelines to help patient achieve fitness and weight loss goals.   Expected Outcomes Begin regular aerobic exercise routine to achieve fitness and weight loss goals.      ITP Comments:      ITP Comments    Row Name 01/31/16 0909           ITP Comments Dr. Fransico Him, Medical Director          Comments: Arbie Cookey plans on starting exercise at the end of the month.[name], RN,BSN 02/08/2016 12:19 PM

## 2016-02-08 ENCOUNTER — Encounter (HOSPITAL_COMMUNITY): Payer: PPO

## 2016-02-08 ENCOUNTER — Ambulatory Visit: Payer: PPO | Admitting: Pulmonary Disease

## 2016-02-11 ENCOUNTER — Encounter (HOSPITAL_COMMUNITY): Payer: PPO

## 2016-02-12 ENCOUNTER — Other Ambulatory Visit: Payer: Self-pay | Admitting: Family Medicine

## 2016-02-13 ENCOUNTER — Encounter (HOSPITAL_COMMUNITY)
Admission: RE | Admit: 2016-02-13 | Discharge: 2016-02-13 | Disposition: A | Discharge status: Home / Self Care | Payer: PPO | Source: Ambulatory Visit | Attending: Cardiovascular Disease | Admitting: Cardiovascular Disease

## 2016-02-13 DIAGNOSIS — Z955 Presence of coronary angioplasty implant and graft: Secondary | ICD-10-CM | POA: Diagnosis not present

## 2016-02-13 NOTE — Progress Notes (Signed)
Daily Session Note  Patient Details  Name: Andrea [name] MRN: [id number] Date of Birth: 1941-04-06 Referring Provider:   Flowsheet Row CARDIAC REHAB PHASE II ORIENTATION from 01/31/2016 in [facility]  Referring Provider  Kathlyn Sacramento MD      Encounter Date: 02/13/2016  Check In:     Session Check In - 02/13/16 1549      Check-In   Staff Present Luetta Nutting Fair, MS, ACSM RCEP, Exercise Physiologist;Joann Rion, RN, Deland Pretty, MS, ACSM CEP, Exercise Physiologist;Satin Boal, RN, BSN   Supervising physician immediately available to respond to emergencies [facility] immediately available   Physician(s) Dr. Dyann Kief    Medication changes reported     No   Fall or balance concerns reported    No   Warm-up and Cool-down Performed as group-led instruction   Resistance Training Performed No   VAD Patient? No     Pain Assessment   Currently in Pain? No/denies      Capillary Blood Glucose: No results found for this or any previous visit (from the past 24 hour(s)).   Goals Met:  Exercise tolerated well  Goals Unmet:  Not Applicable  Comments: Pt started cardiac rehab today.  Pt tolerated light exercise without difficulty. VSS, telemetry-Sinus rhythm with T wave inversion, asymptomatic.  Medication list reconciled. Pt denies barriers to medicaiton compliance.  PSYCHOSOCIAL ASSESSMENT:  PHQ-0. Pt exhibits positive coping skills, hopeful outlook with supportive family. No psychosocial needs identified at this time, no psychosocial interventions necessary.    Pt enjoys working with her hands, crafts sewing and quilting.   Pt oriented to exercise equipment and routine.    Understanding verbalized.[name], RN,BSN 02/13/2016 5:29 PM   Dr. Fransico Him is Medical Director for Cardiac Rehab at [facility].

## 2016-02-14 ENCOUNTER — Ambulatory Visit: Payer: PPO | Admitting: Pulmonary Disease

## 2016-02-15 ENCOUNTER — Other Ambulatory Visit: Payer: Self-pay | Admitting: Otolaryngology

## 2016-02-15 ENCOUNTER — Encounter (HOSPITAL_COMMUNITY): Payer: PPO

## 2016-02-15 DIAGNOSIS — IMO0001 Reserved for inherently not codable concepts without codable children: Secondary | ICD-10-CM

## 2016-02-15 DIAGNOSIS — H918X1 Other specified hearing loss, right ear: Secondary | ICD-10-CM

## 2016-02-18 ENCOUNTER — Encounter (HOSPITAL_COMMUNITY): Payer: PPO

## 2016-02-20 ENCOUNTER — Encounter (HOSPITAL_COMMUNITY): Payer: PPO

## 2016-02-20 ENCOUNTER — Telehealth (HOSPITAL_COMMUNITY): Payer: Self-pay | Admitting: Family Medicine

## 2016-02-22 ENCOUNTER — Encounter (HOSPITAL_COMMUNITY): Payer: PPO

## 2016-02-25 ENCOUNTER — Encounter (HOSPITAL_COMMUNITY): Payer: PPO

## 2016-02-27 ENCOUNTER — Ambulatory Visit
Admission: RE | Admit: 2016-02-27 | Discharge: 2016-02-27 | Disposition: A | Discharge status: Home / Self Care | Payer: PPO | Source: Ambulatory Visit | Attending: Otolaryngology | Admitting: Otolaryngology

## 2016-02-27 ENCOUNTER — Encounter (HOSPITAL_COMMUNITY)
Admission: RE | Admit: 2016-02-27 | Discharge: 2016-02-27 | Disposition: A | Discharge status: Home / Self Care | Payer: PPO | Source: Ambulatory Visit | Attending: Cardiovascular Disease | Admitting: Cardiovascular Disease

## 2016-02-27 DIAGNOSIS — H9191 Unspecified hearing loss, right ear: Secondary | ICD-10-CM | POA: Diagnosis not present

## 2016-02-27 DIAGNOSIS — IMO0001 Reserved for inherently not codable concepts without codable children: Secondary | ICD-10-CM

## 2016-02-27 DIAGNOSIS — H918X1 Other specified hearing loss, right ear: Secondary | ICD-10-CM

## 2016-02-27 DIAGNOSIS — Z955 Presence of coronary angioplasty implant and graft: Secondary | ICD-10-CM | POA: Diagnosis not present

## 2016-02-27 MED ORDER — GADOBENATE DIMEGLUMINE 529 MG/ML IV SOLN
13.0000 mL | Freq: Once | INTRAVENOUS | Status: AC | PRN
  Administered 2016-02-27: 13 mL via INTRAVENOUS

## 2016-02-27 NOTE — Progress Notes (Signed)
Reviewed home exercise guidelines with patient including endpoints, temperature precautions, target heart rate and rate of perceived exertion. Pt is currently walking 30-45 minutes, 3 days per week as his mode of home exercise. Pt voices understanding of instructions given. Sol Passer, MS, ACSM CCEP

## 2016-02-28 ENCOUNTER — Ambulatory Visit (INDEPENDENT_AMBULATORY_CARE_PROVIDER_SITE_OTHER): Payer: PPO | Admitting: Pulmonary Disease

## 2016-02-28 ENCOUNTER — Encounter: Payer: Self-pay | Admitting: Pulmonary Disease

## 2016-02-28 VITALS — BP 130/84 | HR 71 | Ht 62.0 in | Wt 141.0 lb

## 2016-02-28 DIAGNOSIS — Z961 Presence of intraocular lens: Secondary | ICD-10-CM | POA: Diagnosis not present

## 2016-02-28 DIAGNOSIS — J449 Chronic obstructive pulmonary disease, unspecified: Secondary | ICD-10-CM

## 2016-02-28 NOTE — Patient Instructions (Signed)
Continue Symbicort, Spiriva  You may stop Singulair Continue albuterol as needed Follow up in 6 months

## 2016-02-29 ENCOUNTER — Encounter (HOSPITAL_COMMUNITY)
Admission: RE | Admit: 2016-02-29 | Discharge: 2016-02-29 | Disposition: A | Discharge status: Home / Self Care | Payer: PPO | Source: Ambulatory Visit | Attending: Cardiovascular Disease | Admitting: Cardiovascular Disease

## 2016-02-29 VITALS — BP 120/80 | HR 72 | Ht 62.0 in | Wt 141.5 lb

## 2016-02-29 DIAGNOSIS — Z955 Presence of coronary angioplasty implant and graft: Secondary | ICD-10-CM

## 2016-02-29 DIAGNOSIS — J449 Chronic obstructive pulmonary disease, unspecified: Secondary | ICD-10-CM

## 2016-03-02 NOTE — Progress Notes (Signed)
PROBLEMS: COPD with significant asthmatic component  FEV1 67% pred 2014  PFTs 07/25/15: Mild-mod obstruction. FEV1 1.22 > 1.31 liters (65 > 69% pred) Chronic cough GERD  INTERVAL: [facility] 01/02/16  SUBJ: No new respiratory problems. No new complaints. She feels that her DOE is slightly improved since last visit. Denies CP, fever, purulent sputum, hemoptysis, LE edema and calf tenderness.  OBJ: Vitals:   02/28/16 1201  BP: 130/84  Pulse: 71  SpO2: 96%  Weight: 141 lb (64 kg)  Height: '5\' 2"'$  (1.575 m)   NAD @ rest HEENT WNL No JVD BS full, no wheezes Reg, no M NABS No C/C/E  DATA: PFTs 07/25/15 reviewed  IMPRESSION: Mild COPD - well controlled   PLAN: Continue Symbicort and Spiriva  Stop Singulair  Follow up in 6 months or PRN    Merton Border, MD PCCM service Mobile [phone] Pager [phone] 03/02/2016

## 2016-03-03 ENCOUNTER — Encounter (HOSPITAL_COMMUNITY): Payer: PPO

## 2016-03-04 ENCOUNTER — Ambulatory Visit (INDEPENDENT_AMBULATORY_CARE_PROVIDER_SITE_OTHER): Payer: PPO | Admitting: Physician Assistant

## 2016-03-04 ENCOUNTER — Encounter: Payer: Self-pay | Admitting: Physician Assistant

## 2016-03-04 ENCOUNTER — Other Ambulatory Visit: Payer: Self-pay | Admitting: Cardiovascular Disease

## 2016-03-04 VITALS — BP 104/56 | HR 53 | Temp 98.2°F | Resp 16 | Ht 62.0 in | Wt 143.4 lb

## 2016-03-04 DIAGNOSIS — J208 Acute bronchitis due to other specified organisms: Secondary | ICD-10-CM

## 2016-03-04 DIAGNOSIS — B9689 Other specified bacterial agents as the cause of diseases classified elsewhere: Secondary | ICD-10-CM | POA: Diagnosis not present

## 2016-03-04 MED ORDER — DOXYCYCLINE HYCLATE 100 MG PO CAPS: 100.0000 mg | ORAL_CAPSULE | Freq: Two times a day (BID) | ORAL | 0 refills | Status: DC

## 2016-03-04 MED ORDER — BENZONATATE 100 MG PO CAPS: 100.0000 mg | ORAL_CAPSULE | Freq: Two times a day (BID) | ORAL | 0 refills | Status: DC | PRN

## 2016-03-04 NOTE — Patient Instructions (Signed)
Take antibiotic (Doxycycline) as directed.  Increase fluids.  Get plenty of rest. Use Mucinex for congestion. Tessalon for cough. Take a daily probiotic (I recommend Align or Culturelle, but even Activia Yogurt may be beneficial).  A humidifier placed in the bedroom may offer some relief for a dry, scratchy throat of nasal irritation.  Read information below on acute bronchitis. Please call or return to clinic if symptoms are not improving.  Acute Bronchitis Bronchitis is when the airways that extend from the windpipe into the lungs get red, puffy, and painful (inflamed). Bronchitis often causes thick spit (mucus) to develop. This leads to a cough. A cough is the most common symptom of bronchitis. In acute bronchitis, the condition usually begins suddenly and goes away over time (usually in 2 weeks). Smoking, allergies, and asthma can make bronchitis worse. Repeated episodes of bronchitis may cause more lung problems.  HOME CARE Rest. Drink enough fluids to keep your pee (urine) clear or pale yellow (unless you need to limit fluids as told by your doctor). Only take over-the-counter or prescription medicines as told by your doctor. Avoid smoking and secondhand smoke. These can make bronchitis worse. If you are a smoker, think about using nicotine gum or skin patches. Quitting smoking will help your lungs heal faster. Reduce the chance of getting bronchitis again by: Washing your hands often. Avoiding people with cold symptoms. Trying not to touch your hands to your mouth, nose, or eyes. Follow up with your doctor as told.  GET HELP IF: Your symptoms do not improve after 1 week of treatment. Symptoms include: Cough. Fever. Coughing up thick spit. Body aches. Chest congestion. Chills. Shortness of breath. Sore throat.  GET HELP RIGHT AWAY IF:  You have an increased fever. You have chills. You have severe shortness of breath. You have bloody thick spit (sputum). You throw up (vomit)  often. You lose too much body fluid (dehydration). You have a severe headache. You faint.  MAKE SURE YOU:  Understand these instructions. Will watch your condition. Will get help right away if you are not doing well or get worse. Document Released: 10/29/2007 Document Revised: 01/12/2013 Document Reviewed: 11/02/2012 ExitCare Patient Information 2015 [employer]. This information is not intended to replace advice given to you by your health care provider. Make sure you discuss any questions you have with your health care provider.  

## 2016-03-04 NOTE — Progress Notes (Signed)
Patient presents to clinic today c/o 1 week of worsening chest congestion, fatigue, sinus pressure and cough that was initially dry but now has become productive of thick green sputum. Patient with history of asthma, is taking medications as directed. Endorses wheezing but no major increased work of breathing. Notes SOB during coughing spells. Denies chest pain. Denies recent travel or sick contact. Notes subjective fevers and chills.   Past Medical History:  Diagnosis Date  . AAA (abdominal aortic aneurysm) ([place]) 01/2009   AAA (2.8 x 3.0)  moderate RAS (left); 2.7 x 2.7 cm (07/11/05)  . Acute bronchitis 03/20/2013; 2017  . Angina   . Anosmia   . Asthma   . Baker's cyst of knee 01/30/2014  . Basal cell carcinoma    "back and left arm"  . Bradycardia    Metoprolol stopped 08/2011  . CAD (coronary artery disease)    s/CABG (reports IMA and 2 SVGs) back in 1999  Myoview normal 3/10; s/p PCI with DES to PL branch of distal RCA 09/2011; PCI +DES to SVG-RCA, PCI + DES to mid LCx 12/2015  . Cerebrovascular disease 01/2009   carotid u/s  R 0-39%   L 60-79%  . Chronic thoracic back pain   . COPD (chronic obstructive pulmonary disease) ([place])   . Depression   . Dizziness   . Dysrhythmia    hx of sinus brady  . GERD (gastroesophageal reflux disease)   . Heart murmur   . Herniated lumbar disc without myelopathy   . History of blood transfusion 1999   "when I had the bypass; had a PE"  . Hyperglycemia 10/23/2015  . Hyperlipidemia   . Hypertension   . Medicare annual wellness visit, subsequent 07/31/2013  . NSTEMI (non-ST elevated myocardial infarction) ([place]) 11/12   Cath showed atretic IMA graft to the LAD, SVG to PD was patent but the continuation of this graft to the PL branch was occluded; there were L to R collaterals; Mid LAD had a 60 to 70% stenosis. She has been treated medically.  Neg Myoview 05/2011  . Osteoarthritis of back   . Osteoporosis   . Pneumonia "several times"  . Pulmonary  embolism ([place]) 1999   "after my bypass"  . PVD (peripheral vascular disease) ([place])   . Shortness of breath   . Squamous carcinoma    "nose"    Current Outpatient Prescriptions on File Prior to Visit  Medication Sig Dispense Refill  . acetaminophen (TYLENOL) 650 MG CR tablet Take 1,300 mg by mouth 2 (two) times daily.    Marland Kitchen albuterol (PROVENTIL HFA;VENTOLIN HFA) 108 (90 Base) MCG/ACT inhaler Inhale 2 puffs into the lungs every 6 (six) hours as needed for wheezing. 1 Inhaler 11  . Calcium Carbonate-Vitamin D (CALCIUM + D PO) Take 1 tablet by mouth 2 (two) times daily.    . clopidogrel (PLAVIX) 75 MG tablet Take 1 tablet (75 mg total) by mouth daily. 90 tablet 3  . diclofenac sodium (VOLTAREN) 1 % GEL Apply 2 g topically 3 (three) times daily as needed (pain).    . fluticasone (FLONASE) 50 MCG/ACT nasal spray Place 2 sprays into the nose 2 (two) times daily as needed for allergies. For congestion     . hydrochlorothiazide (HYDRODIURIL) 25 MG tablet Take 1 tablet (25 mg total) by mouth every morning. 90 tablet 3  . HYDROcodone-acetaminophen (NORCO/VICODIN) 5-325 MG tablet Take 1 tablet by mouth 2 (two) times daily as needed. 40 tablet 0  . HYDROcodone-homatropine (  HYCODAN) 5-1.5 MG/5ML syrup Take 5 mLs by mouth every 8 (eight) hours as needed for cough. 120 mL 0  . isosorbide mononitrate (IMDUR) 60 MG 24 hr tablet Take 1 tablet (60 mg total) by mouth daily. 90 tablet 3  . Krill Oil 1000 MG CAPS Take 1,000 mg by mouth daily.    Marland Kitchen losartan (COZAAR) 100 MG tablet TAKE 1 TABLET (100 MG TOTAL) BY MOUTH DAILY. 90 tablet 3  . nitroGLYCERIN (NITROSTAT) 0.4 MG SL tablet Place 1 tablet (0.4 mg total) under the tongue every 5 (five) minutes as needed for chest pain. 25 tablet 2  . pantoprazole (PROTONIX) 40 MG tablet TAKE 1 TABLET (40 MG TOTAL) BY MOUTH DAILY. 90 tablet 3  . Polyethyl Glycol-Propyl Glycol (SYSTANE OP) Place 1 drop into both eyes daily as needed (dry eyes).    . potassium chloride (K-DUR) 10  MEQ tablet Take 1 tablet (10 mEq total) by mouth daily as needed. 90 tablet 3  . rosuvastatin (CRESTOR) 40 MG tablet Take 1 tablet (40 mg total) by mouth daily. 90 tablet 3  . sertraline (ZOLOFT) 50 MG tablet TAKE 1 TABLET EVERY DAY **NEED OFFICE VISIT 90 tablet 1  . Spacer/Aero-Holding Chambers DEVI 1 spacer to use prn with HFA device as directed 1 each 0  . SYMBICORT 160-4.5 MCG/ACT inhaler INHALE 2 PUFFS INTO THE LUNGS 2 (TWO) TIMES DAILY. 10.2 Inhaler 4  . tiotropium (SPIRIVA) 18 MCG inhalation capsule Place 1 capsule (18 mcg total) into inhaler and inhale daily. 30 capsule 11   No current facility-administered medications on file prior to visit.     Allergies  Allergen Reactions  . Ciprofloxacin     REACTION: rash IV  . Codeine     REACTION: nausea/vomiting  . Erythromycin     REACTION: tongue burns  . Meperidine Hcl     REACTION: Nausea/vomiting  . Penicillins     REACTION: rash  . Shellfish Allergy Nausea And Vomiting  . Tape Rash    Family History  Problem Relation Age of Onset  . Heart attack Mother 23  . Diabetes Mother   . Colon cancer Father 28  . Lung cancer Father     smoked  . Heart disease Father     angina  . Hypertension Father   . Cancer Father     colon, lung  . Lung cancer Sister     smoked  . Cancer Sister   . Hypothyroidism Sister   . Hypertension Brother   . Heart attack Brother   . Heart disease Brother     stent  . Heart attack Brother     CABG  . Heart disease Brother     CABG with 1 bypass  . Aneurysm Brother     brain  . Alcohol abuse Brother   . Hyperlipidemia Daughter   . Diabetes Maternal Grandmother   . Stroke Maternal Grandmother   . Cirrhosis Maternal Grandfather   . Stroke Paternal Grandmother   . Obesity Daughter   . Obesity Son     Social History   Social History  . Marital status: Married    Spouse name: N/A  . Number of children: 5  . Years of education: N/A   Occupational History  . Retired Retired     former  Marine scientist   Social History Main Topics  . Smoking status: Former Smoker    Packs/day: 0.50    Years: 40.00    Types: Cigarettes    Quit  date: 01/19/1993  . Smokeless tobacco: Never Used  . Alcohol use Yes     Comment: 01/02/2016 "might drink a glass of wine socially q 3-4 months"  . Drug use: No  . Sexual activity: Not Currently    Birth control/ protection: Post-menopausal   Other Topics Concern  . None   Social History Narrative   Married with 5 children    Review of Systems - See HPI.  All other ROS are negative.  BP (!) 104/56 (BP Location: Left Arm, Patient Position: Sitting, Cuff Size: Normal)   Pulse (!) 53   Temp 98.2 F (36.8 C) (Oral)   Resp 16   Ht 5' 2" (1.575 m)   Wt 143 lb 6 oz (65 kg)   SpO2 96%   BMI 26.22 kg/m   Physical Exam  Constitutional: She is oriented to person, place, and time and well-developed, well-nourished, and in no distress.  HENT:  Head: Normocephalic and atraumatic.  Right Ear: External ear normal.  Left Ear: External ear normal.  Nose: Nose normal.  Mouth/Throat: Oropharynx is clear and moist. No oropharyngeal exudate.  TM within normal limits bilaterally.  Eyes: Conjunctivae are normal.  Neck: Neck supple.  Cardiovascular: Normal rate, regular rhythm, normal heart sounds and intact distal pulses.   Pulmonary/Chest: No respiratory distress. She has wheezes. She has no rales. She exhibits no tenderness.  Neurological: She is alert and oriented to person, place, and time.  Skin: Skin is warm and dry. No rash noted.  Psychiatric: Affect normal.  Vitals reviewed.   Recent Results (from the past 2160 hour(s))  CBC with Differential/Platelet     Status: Abnormal   Collection Time: 12/27/15  6:12 PM  Result Value Ref Range   WBC 6.4 3.6 - 11.0 K/uL   RBC 5.07 3.80 - 5.20 MIL/uL   Hemoglobin 14.2 12.0 - 16.0 g/dL   HCT 41.1 35.0 - 47.0 %   MCV 81.1 80.0 - 100.0 fL   MCH 28.0 26.0 - 34.0 pg   MCHC 34.6 32.0 - 36.0 g/dL   RDW  16.3 (H) 11.5 - 14.5 %   Platelets 170 150 - 440 K/uL   Neutrophils Relative % 64 %   Neutro Abs 4.2 1.4 - 6.5 K/uL   Lymphocytes Relative 26 %   Lymphs Abs 1.6 1.0 - 3.6 K/uL   Monocytes Relative 6 %   Monocytes Absolute 0.4 0.2 - 0.9 K/uL   Eosinophils Relative 3 %   Eosinophils Absolute 0.2 0 - 0.7 K/uL   Basophils Relative 1 %   Basophils Absolute 0.1 0 - 0.1 K/uL  INR/PT     Status: None   Collection Time: 12/27/15  6:12 PM  Result Value Ref Range   Prothrombin Time 12.3 11.4 - 15.2 seconds   INR 5.40   Basic metabolic panel     Status: None   Collection Time: 12/27/15  6:12 PM  Result Value Ref Range   Sodium 141 135 - 145 mmol/L   Potassium 4.1 3.5 - 5.1 mmol/L   Chloride 105 101 - 111 mmol/L   CO2 31 22 - 32 mmol/L   Glucose, Bld 97 65 - 99 mg/dL   BUN 16 6 - 20 mg/dL   Creatinine, Ser 0.78 0.44 - 1.00 mg/dL   Calcium 9.1 8.9 - 10.3 mg/dL   GFR calc non Af Amer >60 >60 mL/min   GFR calc Af Amer >60 >60 mL/min    Comment: (NOTE) The eGFR has been calculated  using the CKD EPI equation. This calculation has not been validated in all clinical situations. eGFR's persistently <60 mL/min signify possible Chronic Kidney Disease.    Anion gap 5 5 - 15  Troponin I     Status: None   Collection Time: 01/02/16  9:10 AM  Result Value Ref Range   Troponin I <0.03 <0.03 ng/mL  POCT Activated clotting time     Status: None   Collection Time: 01/02/16  9:23 AM  Result Value Ref Range   Activated Clotting Time 488 seconds  Troponin I     Status: Abnormal   Collection Time: 01/03/16  4:45 AM  Result Value Ref Range   Troponin I 7.39 (HH) <0.03 ng/mL    Comment: CRITICAL RESULT CALLED TO, READ BACK BY AND VERIFIED WITH: T SCOTT,RN [id number] 8.10.17 G MCADOO   Basic metabolic panel     Status: Abnormal   Collection Time: 01/03/16  4:45 AM  Result Value Ref Range   Sodium 139 135 - 145 mmol/L   Potassium 4.0 3.5 - 5.1 mmol/L   Chloride 103 101 - 111 mmol/L   CO2 28 22 - 32  mmol/L   Glucose, Bld 108 (H) 65 - 99 mg/dL   BUN 12 6 - 20 mg/dL   Creatinine, Ser 0.88 0.44 - 1.00 mg/dL   Calcium 9.1 8.9 - 10.3 mg/dL   GFR calc non Af Amer >60 >60 mL/min   GFR calc Af Amer >60 >60 mL/min    Comment: (NOTE) The eGFR has been calculated using the CKD EPI equation. This calculation has not been validated in all clinical situations. eGFR's persistently <60 mL/min signify possible Chronic Kidney Disease.    Anion gap 8 5 - 15  CBC     Status: Abnormal   Collection Time: 01/03/16  4:45 AM  Result Value Ref Range   WBC 8.7 4.0 - 10.5 K/uL   RBC 4.97 3.87 - 5.11 MIL/uL   Hemoglobin 13.5 12.0 - 15.0 g/dL   HCT 42.5 36.0 - 46.0 %   MCV 85.5 78.0 - 100.0 fL   MCH 27.2 26.0 - 34.0 pg   MCHC 31.8 30.0 - 36.0 g/dL   RDW 15.3 11.5 - 15.5 %   Platelets 148 (L) 150 - 400 K/uL  Troponin I     Status: Abnormal   Collection Time: 01/03/16 11:13 AM  Result Value Ref Range   Troponin I 5.73 (HH) <0.03 ng/mL    Comment: CRITICAL VALUE NOTED.  VALUE IS CONSISTENT WITH PREVIOUSLY REPORTED AND CALLED VALUE.    Assessment/Plan: 1. Acute bacterial bronchitis Albuterol neb given x 1 with improvement in wheeze. Will start ABX. Rx Doxycycline and Tessalon. Increase fluids. Continue chronic asthma medications and plain Mucinex. Supportive measures reviewed. FU if symptoms are not improving or if there is any worsening symptoms. - doxycycline (VIBRAMYCIN) 100 MG capsule; Take 1 capsule (100 mg total) by mouth 2 (two) times daily.  Dispense: 14 capsule; Refill: 0 - benzonatate (TESSALON) 100 MG capsule; Take 1 capsule (100 mg total) by mouth 2 (two) times daily as needed for cough.  Dispense: 20 capsule; Refill: 0   Leeanne Rio, [place]

## 2016-03-04 NOTE — Progress Notes (Signed)
Pre visit review using our clinic review tool, if applicable. No additional management support is needed unless otherwise documented below in the visit note/SLS  

## 2016-03-05 ENCOUNTER — Encounter (HOSPITAL_COMMUNITY): Payer: PPO

## 2016-03-06 ENCOUNTER — Other Ambulatory Visit: Payer: Self-pay | Admitting: Cardiovascular Disease

## 2016-03-06 ENCOUNTER — Encounter: Payer: Self-pay | Admitting: *Deleted

## 2016-03-06 ENCOUNTER — Encounter (HOSPITAL_COMMUNITY): Payer: Self-pay | Admitting: *Deleted

## 2016-03-06 DIAGNOSIS — Z955 Presence of coronary angioplasty implant and graft: Secondary | ICD-10-CM

## 2016-03-06 DIAGNOSIS — I739 Peripheral vascular disease, unspecified: Secondary | ICD-10-CM

## 2016-03-06 DIAGNOSIS — Z006 Encounter for examination for normal comparison and control in clinical research program: Secondary | ICD-10-CM

## 2016-03-06 DIAGNOSIS — M79605 Pain in left leg: Secondary | ICD-10-CM

## 2016-03-06 NOTE — Progress Notes (Signed)
Cardiac Individual Treatment Plan  Patient Details  Name: Andrea [name] MRN: [id number] Date of Birth: August 06, 1940 Referring Provider:   Flowsheet Row CARDIAC REHAB PHASE II ORIENTATION from 01/31/2016 in [place]  Referring Provider  Kathlyn Sacramento MD      Initial Encounter Date:  [facility] from 01/31/2016 in [place]  Date  01/31/16  Referring Provider  Kathlyn Sacramento MD      Visit Diagnosis:  01/02/16 Stented coronary artery  Patient's Home Medications on Admission:  Current Outpatient Prescriptions:  .  acetaminophen (TYLENOL) 650 MG CR tablet, Take 1,300 mg by mouth 2 (two) times daily., Disp: , Rfl:  .  albuterol (PROVENTIL HFA;VENTOLIN HFA) 108 (90 Base) MCG/ACT inhaler, Inhale 2 puffs into the lungs every 6 (six) hours as needed for wheezing., Disp: 1 Inhaler, Rfl: 11 .  benzonatate (TESSALON) 100 MG capsule, Take 1 capsule (100 mg total) by mouth 2 (two) times daily as needed for cough., Disp: 20 capsule, Rfl: 0 .  Calcium Carbonate-Vitamin D (CALCIUM + D PO), Take 1 tablet by mouth 2 (two) times daily., Disp: , Rfl:  .  clopidogrel (PLAVIX) 75 MG tablet, Take 1 tablet (75 mg total) by mouth daily., Disp: 90 tablet, Rfl: 3 .  diclofenac sodium (VOLTAREN) 1 % GEL, Apply 2 g topically 3 (three) times daily as needed (pain)., Disp: , Rfl:  .  doxycycline (VIBRAMYCIN) 100 MG capsule, Take 1 capsule (100 mg total) by mouth 2 (two) times daily., Disp: 14 capsule, Rfl: 0 .  fluticasone (FLONASE) 50 MCG/ACT nasal spray, Place 2 sprays into the nose 2 (two) times daily as needed for allergies. For congestion , Disp: , Rfl:  .  hydrochlorothiazide (HYDRODIURIL) 25 MG tablet, Take 1 tablet (25 mg total) by mouth every morning., Disp: 90 tablet, Rfl: 3 .  HYDROcodone-acetaminophen (NORCO/VICODIN) 5-325 MG tablet, Take 1 tablet by mouth 2 (two) times daily as needed., Disp: 40 tablet,  Rfl: 0 .  HYDROcodone-homatropine (HYCODAN) 5-1.5 MG/5ML syrup, Take 5 mLs by mouth every 8 (eight) hours as needed for cough., Disp: 120 mL, Rfl: 0 .  isosorbide mononitrate (IMDUR) 60 MG 24 hr tablet, Take 1 tablet (60 mg total) by mouth daily., Disp: 90 tablet, Rfl: 3 .  Krill Oil 1000 MG CAPS, Take 1,000 mg by mouth daily., Disp: , Rfl:  .  losartan (COZAAR) 100 MG tablet, TAKE 1 TABLET (100 MG TOTAL) BY MOUTH DAILY., Disp: 90 tablet, Rfl: 3 .  nitroGLYCERIN (NITROSTAT) 0.4 MG SL tablet, Place 1 tablet (0.4 mg total) under the tongue every 5 (five) minutes as needed for chest pain., Disp: 25 tablet, Rfl: 2 .  pantoprazole (PROTONIX) 40 MG tablet, TAKE 1 TABLET (40 MG TOTAL) BY MOUTH DAILY., Disp: 90 tablet, Rfl: 3 .  Polyethyl Glycol-Propyl Glycol (SYSTANE OP), Place 1 drop into both eyes daily as needed (dry eyes)., Disp: , Rfl:  .  potassium chloride (K-DUR) 10 MEQ tablet, Take 1 tablet (10 mEq total) by mouth daily as needed., Disp: 90 tablet, Rfl: 3 .  rosuvastatin (CRESTOR) 40 MG tablet, Take 1 tablet (40 mg total) by mouth daily., Disp: 90 tablet, Rfl: 3 .  sertraline (ZOLOFT) 50 MG tablet, TAKE 1 TABLET EVERY DAY **NEED OFFICE VISIT, Disp: 90 tablet, Rfl: 1 .  Spacer/Aero-Holding Chambers DEVI, 1 spacer to use prn with HFA device as directed, Disp: 1 each, Rfl: 0 .  SYMBICORT 160-4.5 MCG/ACT inhaler, INHALE  2 PUFFS INTO THE LUNGS 2 (TWO) TIMES DAILY., Disp: 10.2 Inhaler, Rfl: 4 .  tiotropium (SPIRIVA) 18 MCG inhalation capsule, Place 1 capsule (18 mcg total) into inhaler and inhale daily., Disp: 30 capsule, Rfl: 11  Past Medical History: Past Medical History:  Diagnosis Date  . AAA (abdominal aortic aneurysm) ([place]) 01/2009   AAA (2.8 x 3.0)  moderate RAS (left); 2.7 x 2.7 cm (07/11/05)  . Acute bronchitis 03/20/2013; 2017  . Angina   . Anosmia   . Asthma   . Baker's cyst of knee 01/30/2014  . Basal cell carcinoma    "back and left arm"  . Bradycardia    Metoprolol stopped 08/2011   . CAD (coronary artery disease)    s/CABG (reports IMA and 2 SVGs) back in 1999  Myoview normal 3/10; s/p PCI with DES to PL branch of distal RCA 09/2011; PCI +DES to SVG-RCA, PCI + DES to mid LCx 12/2015  . Cerebrovascular disease 01/2009   carotid u/s  R 0-39%   L 60-79%  . Chronic thoracic back pain   . COPD (chronic obstructive pulmonary disease) ([place])   . Depression   . Dizziness   . Dysrhythmia    hx of sinus brady  . GERD (gastroesophageal reflux disease)   . Heart murmur   . Herniated lumbar disc without myelopathy   . History of blood transfusion 1999   "when I had the bypass; had a PE"  . Hyperglycemia 10/23/2015  . Hyperlipidemia   . Hypertension   . Medicare annual wellness visit, subsequent 07/31/2013  . NSTEMI (non-ST elevated myocardial infarction) ([place]) 11/12   Cath showed atretic IMA graft to the LAD, SVG to PD was patent but the continuation of this graft to the PL branch was occluded; there were L to R collaterals; Mid LAD had a 60 to 70% stenosis. She has been treated medically.  Neg Myoview 05/2011  . Osteoarthritis of back   . Osteoporosis   . Pneumonia "several times"  . Pulmonary embolism ([place]) 1999   "after my bypass"  . PVD (peripheral vascular disease) ([place])   . Shortness of breath   . Squamous carcinoma    "nose"    Tobacco Use: History  Smoking Status  . Former Smoker  . Packs/day: 0.50  . Years: 40.00  . Types: Cigarettes  . Quit date: 01/19/1993  Smokeless Tobacco  . Never Used    Labs: Recent Review Flowsheet Data    Labs for ITP Cardiac and Pulmonary Rehab Latest Ref Rng & Units 03/17/2013 07/26/2013 01/24/2014 02/08/2015 10/23/2015   Cholestrol 0 - 200 mg/dL 306(H) 135 136 144 242(H)   LDLCALC 0 - 99 mg/dL 199(H) 61 75 - 180(H)   LDLDIRECT mg/dL - - - 83.0 -   HDL >39.00 mg/dL 34(L) 31(L) 34.50(L) 30.90(L) 33.50(L)   Trlycerides 0.0 - 149.0 mg/dL 364(H) 214(H) 134.0 330.0(H) 145.0   Hemoglobin A1c 4.6 - 6.5 % - - - 6.1 6.3   TCO2 0 - 100  mmol/L - - - - -      Capillary Blood Glucose: No results found for: GLUCAP   Exercise Target Goals:    Exercise Program Goal: Individual exercise prescription set with THRR, safety & activity barriers. Participant demonstrates ability to understand and report RPE using BORG scale, to self-measure pulse accurately, and to acknowledge the importance of the exercise prescription.  Exercise Prescription Goal: Starting with aerobic activity 30 plus minutes a day, 3 days per week for initial exercise  prescription. Provide home exercise prescription and guidelines that participant acknowledges understanding prior to discharge.  Activity Barriers & Risk Stratification:     Activity Barriers & Cardiac Risk Stratification - 01/31/16 0911      Activity Barriers & Cardiac Risk Stratification   Activity Barriers Back Problems;Shortness of Breath;Deconditioning   Cardiac Risk Stratification High      6 Minute Walk:     6 Minute Walk    Row Name 01/31/16 1146 01/31/16 1205 01/31/16 1225     6 Minute Walk   Phase  - Initial Initial   Distance 1704 feet  -  -   Walk Time 6 minutes  -  -   # of Rest Breaks 0  - 0   MPH 3.23  -  -   METS 3.46  -  -   RPE 13  -  -   Perceived Dyspnea  2  -  -   VO2 Peak 12.11  -  -   Symptoms Yes (comment)  -  -   Comments mild SOB: 2  -  -   Resting HR 61 bpm  -  -   Resting BP 128/74  -  -   Max Ex. HR 100 bpm  -  -   Max Ex. BP 162/90  -  -   2 Minute Post BP 130/78  -  -      Initial Exercise Prescription:     Initial Exercise Prescription - 01/31/16 1100      Date of Initial Exercise RX and Referring Provider   Date 01/31/16   Referring Provider Kathlyn Sacramento MD     Treadmill   MPH 2   Grade 1   Minutes 10   METs 2.81     Bike   Level 0.5   Minutes 10   METs 2.45     NuStep   Level 3   Watts 40   Minutes 10   METs 2     Intensity   THRR 40-80% of Max Heartrate 57-114   Ratings of Perceived Exertion 11-13    Perceived Dyspnea 0-4     Progression   Progression Continue to progress workloads to maintain intensity without signs/symptoms of physical distress.     Resistance Training   Training Prescription Yes   Weight 2   Reps 10-12      Perform Capillary Blood Glucose checks as needed.  Exercise Prescription Changes:      Exercise Prescription Changes    Row Name 02/08/16 1000 03/04/16 1200           Response to Exercise   Blood Pressure (Admit) 128/74 120/80      Blood Pressure (Exercise) 162/90 140/78      Blood Pressure (Exit) 130/78 124/70      Heart Rate (Admit) 61 bpm 72 bpm      Heart Rate (Exercise) 100 bpm 100 bpm      Heart Rate (Exit) 58 bpm 70 bpm      Oxygen Saturation (Admit) 95 %  -      Oxygen Saturation (Exercise) 91 %  -      Oxygen Saturation (Exit) 93 %  -      Rating of Perceived Exertion (Exercise) 13 11      Perceived Dyspnea (Exercise) 2  -      Comments  - Reviewed home exercise guidelines on 02/27/16.      Duration Progress to 30 minutes of continuous  aerobic without signs/symptoms of physical distress Progress to 30 minutes of continuous aerobic without signs/symptoms of physical distress      Intensity  - THRR unchanged        Progression   Progression Continue to progress workloads to maintain intensity without signs/symptoms of physical distress. Continue to progress workloads to maintain intensity without signs/symptoms of physical distress.      Average METs 3.5 2.7        Resistance Training   Training Prescription Yes Yes      Weight 2 2      Reps 10-12 10-12        Interval Training   Interval Training No No        Treadmill   MPH 2 2      Grade 1 1      Minutes 10 10      METs 2.81 2.81        Bike   Level 0.5 0.5      Minutes 10 10      METs 2.45 2.47        NuStep   Level 3 3      Watts 40  -      Minutes 10 10      METs 2 2.8        Home Exercise Plan   Plans to continue exercise at  - Home      Frequency  - Add 3  additional days to program exercise sessions.         Exercise Comments:      Exercise Comments    Row Name 02/08/16 1031 02/27/16 1650         Exercise Comments Exercise walk test completed. Reviewed home exercise guidelines with participant.         Discharge Exercise Prescription (Final Exercise Prescription Changes):     Exercise Prescription Changes - 03/04/16 1200      Response to Exercise   Blood Pressure (Admit) 120/80   Blood Pressure (Exercise) 140/78   Blood Pressure (Exit) 124/70   Heart Rate (Admit) 72 bpm   Heart Rate (Exercise) 100 bpm   Heart Rate (Exit) 70 bpm   Rating of Perceived Exertion (Exercise) 11   Comments Reviewed home exercise guidelines on 02/27/16.   Duration Progress to 30 minutes of continuous aerobic without signs/symptoms of physical distress   Intensity THRR unchanged     Progression   Progression Continue to progress workloads to maintain intensity without signs/symptoms of physical distress.   Average METs 2.7     Resistance Training   Training Prescription Yes   Weight 2   Reps 10-12     Interval Training   Interval Training No     Treadmill   MPH 2   Grade 1   Minutes 10   METs 2.81     Bike   Level 0.5   Minutes 10   METs 2.47     NuStep   Level 3   Minutes 10   METs 2.8     Home Exercise Plan   Plans to continue exercise at Home   Frequency Add 3 additional days to program exercise sessions.      Nutrition:  Target Goals: Understanding of nutrition guidelines, daily intake of sodium '1500mg'$ , cholesterol '200mg'$ , calories 30% from fat and 7% or less from saturated fats, daily to have 5 or more servings of fruits and vegetables.  Biometrics:     Pre Biometrics -  01/31/16 1204      Pre Biometrics   Triceps Skinfold 21 mm   % Body Fat 26.8 %   Grip Strength 26 kg   Flexibility 9 in   Single Leg Stand 27.09 seconds       Nutrition Therapy Plan and Nutrition Goals:   Nutrition Discharge:  Nutrition Scores:     Nutrition Assessments - 02/01/16 1025      MEDFICTS Scores   Pre Score 9      Nutrition Goals Re-Evaluation:   Psychosocial: Target Goals: Acknowledge presence or absence of depression, maximize coping skills, provide positive support system. Participant is able to verbalize types and ability to use techniques and skills needed for reducing stress and depression.  Initial Review & Psychosocial Screening:     Initial Psych Review & Screening - 02/01/16 1358      Family Dynamics   Good Support System? Yes      Quality of Life Scores:     Quality of Life - 01/31/16 1202      Quality of Life Scores   Health/Function Pre 27.27 %   Socioeconomic Pre 29.29 %   Psych/Spiritual Pre 29.14 %   Family Pre 27 %   GLOBAL Pre 28.03 %      PHQ-9: Recent Review Flowsheet Data    Depression screen [facility] 2/9 02/13/2016 08/28/2015 02/08/2015 07/31/2013 07/26/2013   Decreased Interest 0 0 0 0 0   Down, Depressed, Hopeless 0 1 0 0 0   PHQ - 2 Score 0 1 0 0 0   Altered sleeping - 1 0 - -   Tired, decreased energy - 2 0 - -   Change in appetite - 1 0 - -   Feeling bad or failure about yourself  - 0 0 - -   Trouble concentrating - 1 0 - -   Moving slowly or fidgety/restless - 0 0 - -   Suicidal thoughts - 0 0 - -   PHQ-9 Score - 6 0 - -   Difficult doing work/chores - Somewhat difficult Not difficult at all - -      Psychosocial Evaluation and Intervention:   Psychosocial Re-Evaluation:     Psychosocial Re-Evaluation    Row Name 03/06/16 1532             Psychosocial Re-Evaluation   Interventions Encouraged to attend Cardiac Rehabilitation for the exercise       Continued Psychosocial Services Needed No          Vocational Rehabilitation: Provide vocational rehab assistance to qualifying candidates.   Vocational Rehab Evaluation & Intervention:     Vocational Rehab - 02/01/16 1426      Initial Vocational Rehab Evaluation & Intervention    Assessment shows need for Vocational Rehabilitation No     Discharge Vocational Rehab   Discharge Vocational Rehabilitation Pt does not plan to return to competive employment.      Education: Education Goals: Education classes will be provided on a weekly basis, covering required topics. Participant will state understanding/return demonstration of topics presented.  Learning Barriers/Preferences:     Learning Barriers/Preferences - 01/31/16 0910      Learning Barriers/Preferences   Learning Barriers Sight  reading glasses   Learning Preferences Group Instruction;Individual Instruction;Pictoral;Skilled Demonstration;Verbal Instruction;Video;Written Material      Education Topics: Count Your Pulse:  -Group instruction provided by verbal instruction, demonstration, patient participation and written materials to support subject.  Instructors address importance of being able to find your  pulse and how to count your pulse when at home without a heart monitor.  Patients get hands on experience counting their pulse with staff help and individually.   Heart Attack, Angina, and Risk Factor Modification:  -Group instruction provided by verbal instruction, video, and written materials to support subject.  Instructors address signs and symptoms of angina and heart attacks.    Also discuss risk factors for heart disease and how to make changes to improve heart health risk factors.   Functional Fitness:  -Group instruction provided by verbal instruction, demonstration, patient participation, and written materials to support subject.  Instructors address safety measures for doing things around the house.  Discuss how to get up and down off the floor, how to pick things up properly, how to safely get out of a chair without assistance, and balance training.   Meditation and Mindfulness:  -Group instruction provided by verbal instruction, patient participation, and written materials to support  subject.  Instructor addresses importance of mindfulness and meditation practice to help reduce stress and improve awareness.  Instructor also leads participants through a meditation exercise.    Stretching for Flexibility and Mobility:  -Group instruction provided by verbal instruction, patient participation, and written materials to support subject.  Instructors lead participants through series of stretches that are designed to increase flexibility thus improving mobility.  These stretches are additional exercise for major muscle groups that are typically performed during regular warm up and cool down.   Hands Only CPR Anytime:  -Group instruction provided by verbal instruction, video, patient participation and written materials to support subject.  Instructors co-teach with AHA video for hands only CPR.  Participants get hands on experience with mannequins.   Nutrition I class: Heart Healthy Eating:  -Group instruction provided by PowerPoint slides, verbal discussion, and written materials to support subject matter. The instructor gives an explanation and review of the Therapeutic Lifestyle Changes diet recommendations, which includes a discussion on lipid goals, dietary fat, sodium, fiber, plant stanol/sterol esters, sugar, and the components of a well-balanced, healthy diet.   Nutrition II class: Lifestyle Skills:  -Group instruction provided by PowerPoint slides, verbal discussion, and written materials to support subject matter. The instructor gives an explanation and review of label reading, grocery shopping for heart health, heart healthy recipe modifications, and ways to make healthier choices when eating out.   Diabetes Question & Answer:  -Group instruction provided by PowerPoint slides, verbal discussion, and written materials to support subject matter. The instructor gives an explanation and review of diabetes co-morbidities, pre- and post-prandial blood glucose goals, pre-exercise  blood glucose goals, signs, symptoms, and treatment of hypoglycemia and hyperglycemia, and foot care basics.   Diabetes Blitz:  -Group instruction provided by PowerPoint slides, verbal discussion, and written materials to support subject matter. The instructor gives an explanation and review of the physiology behind type 1 and type 2 diabetes, diabetes medications and rational behind using different medications, pre- and post-prandial blood glucose recommendations and Hemoglobin A1c goals, diabetes diet, and exercise including blood glucose guidelines for exercising safely.    Portion Distortion:  -Group instruction provided by PowerPoint slides, verbal discussion, written materials, and food models to support subject matter. The instructor gives an explanation of serving size versus portion size, changes in portions sizes over the last 20 years, and what consists of a serving from each food group.   Stress Management:  -Group instruction provided by verbal instruction, video, and written materials to support subject matter.  Instructors review role  of stress in heart disease and how to cope with stress positively.     Exercising on Your Own:  -Group instruction provided by verbal instruction, power point, and written materials to support subject.  Instructors discuss benefits of exercise, components of exercise, frequency and intensity of exercise, and end points for exercise.  Also discuss use of nitroglycerin and activating EMS.  Review options of places to exercise outside of rehab.  Review guidelines for sex with heart disease. Flowsheet Row CARDIAC REHAB PHASE II EXERCISE from 02/13/2016 in [place]  Date  02/13/16  Instruction Review Code  2- meets goals/outcomes      Cardiac Drugs I:  -Group instruction provided by verbal instruction and written materials to support subject.  Instructor reviews cardiac drug classes: antiplatelets, anticoagulants, beta  blockers, and statins.  Instructor discusses reasons, side effects, and lifestyle considerations for each drug class.   Cardiac Drugs II:  -Group instruction provided by verbal instruction and written materials to support subject.  Instructor reviews cardiac drug classes: angiotensin converting enzyme inhibitors (ACE-I), angiotensin II receptor blockers (ARBs), nitrates, and calcium channel blockers.  Instructor discusses reasons, side effects, and lifestyle considerations for each drug class.   Anatomy and Physiology of the Circulatory System:  -Group instruction provided by verbal instruction, video, and written materials to support subject.  Reviews functional anatomy of heart, how it relates to various diagnoses, and what role the heart plays in the overall system.   Knowledge Questionnaire Score:     Knowledge Questionnaire Score - 01/31/16 1145      Knowledge Questionnaire Score   Pre Score 19/24      Core Components/Risk Factors/Patient Goals at Admission:     Personal Goals and Risk Factors at Admission - 01/31/16 0913      Core Components/Risk Factors/Patient Goals on Admission    Weight Management Yes;Weight Loss   Intervention Weight Management: Develop a combined nutrition and exercise program designed to reach desired caloric intake, while maintaining appropriate intake of nutrient and fiber, sodium and fats, and appropriate energy expenditure required for the weight goal.;Weight Management: Provide education and appropriate resources to help participant work on and attain dietary goals.;Weight Management/Obesity: Establish reasonable short term and long term weight goals.   Expected Outcomes Short Term: Continue to assess and modify interventions until short term weight is achieved;Long Term: Adherence to nutrition and physical activity/exercise program aimed toward attainment of established weight goal;Weight Maintenance: Understanding of the daily nutrition guidelines,  which includes 25-35% calories from fat, 7% or less cal from saturated fats, less than '200mg'$  cholesterol, less than 1.5gm of sodium, & 5 or more servings of fruits and vegetables daily;Weight Loss: Understanding of general recommendations for a balanced deficit meal plan, which promotes 1-2 lb weight loss per week and includes a negative energy balance of [id number] kcal/d;Understanding recommendations for meals to include 15-35% energy as protein, 25-35% energy from fat, 35-60% energy from carbohydrates, less than '200mg'$  of dietary cholesterol, 20-35 gm of total fiber daily;Understanding of distribution of calorie intake throughout the day with the consumption of 4-5 meals/snacks   Increase Strength and Stamina Yes   Intervention Provide advice, education, support and counseling about physical activity/exercise needs.;Develop an individualized exercise prescription for aerobic and resistive training based on initial evaluation findings, risk stratification, comorbidities and participant's personal goals.   Expected Outcomes Achievement of increased cardiorespiratory fitness and enhanced flexibility, muscular endurance and strength shown through measurements of functional capacity and personal statement of participant.  Intervention Provide education, individualized exercise plan and daily activity instruction to help decrease symptoms of SOB with activities of daily living.   Expected Outcomes Short Term: Achieves a reduction of symptoms when performing activities of daily living.   Hypertension Yes   Intervention Provide education on lifestyle modifcations including regular physical activity/exercise, weight management, moderate sodium restriction and increased consumption of fresh fruit, vegetables, and low fat dairy, alcohol moderation, and smoking cessation.;Monitor prescription use compliance.   Personal Goal Other Yes   Personal Goal Learn limitations and be able to function at fullest capacity    Intervention Provide education on the benefits of exercise and increase knowledge and awareness on cardiac disease   Expected Outcomes Pt will be able to live knowing limitations and be able to function at fullest capacity      Core Components/Risk Factors/Patient Goals Review:      Goals and Risk Factor Review    Row Name 02/08/16 1032 03/04/16 1227           Core Components/Risk Factors/Patient Goals Review   Personal Goals Review Other Weight Management/Obesity;Other      Review Will provide individualized home exercise guidelines to help patient achieve fitness and weight loss goals. Home exercise guidelines reviewed, parameters for exercise discussed with participant. Participant has lost 3.3 lbs thus far.      Expected Outcomes Begin regular aerobic exercise routine to achieve fitness and weight loss goals. Continue walking 30-45 minutes, 3 days per week in addition to cardiac rehab to achieve weight loss and fitness goals.         Core Components/Risk Factors/Patient Goals at Discharge (Final Review):      Goals and Risk Factor Review - 03/04/16 1227      Core Components/Risk Factors/Patient Goals Review   Personal Goals Review Weight Management/Obesity;Other   Review Home exercise guidelines reviewed, parameters for exercise discussed with participant. Participant has lost 3.3 lbs thus far.   Expected Outcomes Continue walking 30-45 minutes, 3 days per week in addition to cardiac rehab to achieve weight loss and fitness goals.      ITP Comments:     ITP Comments    Row Name 01/31/16 0909           ITP Comments Dr. Fransico Him, Medical Director          Comments: Pt is making expected progress toward personal goals after completing 2 sessions. Recommend continued exercise and life style modification education including  stress management and relaxation techniques to decrease cardiac risk profile. Elsey has only attended 2 sessions due to other personal  obligations.[name], RN,BSN 03/06/2016 3:35 PM

## 2016-03-06 NOTE — Progress Notes (Signed)
[facility] II Research Study 2 month follow up visit completed. Patient denies any angina, and none of her medications have changed other than she is on an antibiotic for bronchitis. Next research appointment will be her 6 month appointment and this can be no later than 07/15/16. Questions encouraged and answered.

## 2016-03-07 ENCOUNTER — Other Ambulatory Visit: Payer: Self-pay | Admitting: Cardiovascular Disease

## 2016-03-07 ENCOUNTER — Ambulatory Visit: Payer: PPO

## 2016-03-07 ENCOUNTER — Other Ambulatory Visit: Payer: PPO

## 2016-03-07 ENCOUNTER — Encounter (HOSPITAL_COMMUNITY): Payer: PPO

## 2016-03-07 ENCOUNTER — Other Ambulatory Visit (INDEPENDENT_AMBULATORY_CARE_PROVIDER_SITE_OTHER): Payer: PPO

## 2016-03-07 DIAGNOSIS — I779 Disorder of arteries and arterioles, unspecified: Secondary | ICD-10-CM

## 2016-03-07 DIAGNOSIS — I10 Essential (primary) hypertension: Secondary | ICD-10-CM

## 2016-03-07 DIAGNOSIS — I2510 Atherosclerotic heart disease of native coronary artery without angina pectoris: Secondary | ICD-10-CM

## 2016-03-07 DIAGNOSIS — I6523 Occlusion and stenosis of bilateral carotid arteries: Secondary | ICD-10-CM

## 2016-03-07 DIAGNOSIS — I739 Peripheral vascular disease, unspecified: Secondary | ICD-10-CM | POA: Diagnosis not present

## 2016-03-07 DIAGNOSIS — I2583 Coronary atherosclerosis due to lipid rich plaque: Secondary | ICD-10-CM | POA: Diagnosis not present

## 2016-03-07 DIAGNOSIS — I714 Abdominal aortic aneurysm, without rupture, unspecified: Secondary | ICD-10-CM

## 2016-03-07 DIAGNOSIS — M79605 Pain in left leg: Secondary | ICD-10-CM

## 2016-03-08 LAB — LIPID PANEL
CHOL/HDL RATIO: 7.9 ratio — AB (ref 0.0–4.4)
Cholesterol, Total: 253 mg/dL — ABNORMAL HIGH (ref 100–199)
HDL: 32 mg/dL — ABNORMAL LOW (ref >39)
LDL Calculated: 183 mg/dL — ABNORMAL HIGH (ref 0–99)
Triglycerides: 188 mg/dL — ABNORMAL HIGH (ref 0–149)
VLDL Cholesterol Cal: 38 mg/dL (ref 5–40)

## 2016-03-08 LAB — HEPATIC FUNCTION PANEL
ALT: 8 IU/L (ref 0–32)
AST: 13 IU/L (ref 0–40)
Albumin: 3.7 g/dL (ref 3.5–4.8)
Alkaline Phosphatase: 77 IU/L (ref 39–117)
Bilirubin Total: 0.4 mg/dL (ref 0.0–1.2)
Bilirubin, Direct: 0.08 mg/dL (ref 0.00–0.40)
TOTAL PROTEIN: 6.3 g/dL (ref 6.0–8.5)

## 2016-03-10 ENCOUNTER — Ambulatory Visit (HOSPITAL_BASED_OUTPATIENT_CLINIC_OR_DEPARTMENT_OTHER)
Admission: RE | Admit: 2016-03-10 | Discharge: 2016-03-10 | Disposition: A | Discharge status: Home / Self Care | Payer: PPO | Source: Ambulatory Visit | Attending: Medical | Admitting: Medical

## 2016-03-10 ENCOUNTER — Encounter: Payer: Self-pay | Admitting: Medical

## 2016-03-10 ENCOUNTER — Encounter (HOSPITAL_COMMUNITY): Payer: PPO

## 2016-03-10 ENCOUNTER — Telehealth: Payer: Self-pay | Admitting: Cardiovascular Disease

## 2016-03-10 ENCOUNTER — Ambulatory Visit (INDEPENDENT_AMBULATORY_CARE_PROVIDER_SITE_OTHER): Payer: PPO | Admitting: Medical

## 2016-03-10 VITALS — BP 117/70 | HR 64 | Temp 98.1°F | Ht 62.0 in | Wt 142.6 lb

## 2016-03-10 DIAGNOSIS — R05 Cough: Secondary | ICD-10-CM | POA: Diagnosis not present

## 2016-03-10 DIAGNOSIS — R062 Wheezing: Secondary | ICD-10-CM | POA: Diagnosis not present

## 2016-03-10 DIAGNOSIS — J209 Acute bronchitis, unspecified: Secondary | ICD-10-CM

## 2016-03-10 DIAGNOSIS — R059 Cough, unspecified: Secondary | ICD-10-CM

## 2016-03-10 LAB — CBC WITH DIFFERENTIAL/PLATELET
BASOS PCT: 0.5 % (ref 0.0–3.0)
Basophils Absolute: 0 10*3/uL (ref 0.0–0.1)
EOS ABS: 0.2 10*3/uL (ref 0.0–0.7)
EOS PCT: 2 % (ref 0.0–5.0)
HCT: 39.1 % (ref 36.0–46.0)
HEMOGLOBIN: 13.1 g/dL (ref 12.0–15.0)
LYMPHS ABS: 2.5 10*3/uL (ref 0.7–4.0)
Lymphocytes Relative: 29.3 % (ref 12.0–46.0)
MCHC: 33.6 g/dL (ref 30.0–36.0)
MCV: 81.2 fl (ref 78.0–100.0)
MONO ABS: 0.6 10*3/uL (ref 0.1–1.0)
Monocytes Relative: 6.7 % (ref 3.0–12.0)
NEUTROS PCT: 61.5 % (ref 43.0–77.0)
Neutro Abs: 5.2 10*3/uL (ref 1.4–7.7)
Platelets: 262 10*3/uL (ref 150.0–400.0)
RBC: 4.81 Mil/uL (ref 3.87–5.11)
RDW: 15.4 % (ref 11.5–15.5)
WBC: 8.4 10*3/uL (ref 4.0–10.5)

## 2016-03-10 MED ORDER — AZITHROMYCIN 250 MG PO TABS: ORAL_TABLET | ORAL | 0 refills | Status: DC

## 2016-03-10 MED ORDER — PREDNISONE 10 MG PO TABS: ORAL_TABLET | ORAL | 0 refills | Status: DC

## 2016-03-10 MED ORDER — HYDROCODONE-HOMATROPINE 5-1.5 MG/5ML PO SYRP: 5.0000 mL | ORAL_SOLUTION | Freq: Three times a day (TID) | ORAL | 0 refills | Status: DC | PRN

## 2016-03-10 MED ORDER — ROSUVASTATIN CALCIUM 40 MG PO TABS: 40.0000 mg | ORAL_TABLET | Freq: Every day | ORAL | 11 refills | Status: DC

## 2016-03-10 NOTE — Progress Notes (Signed)
Pre visit review using our clinic tool,if applicable. No additional management support is needed unless otherwise documented below in the visit note.  

## 2016-03-10 NOTE — Progress Notes (Addendum)
Subjective:    [name], female    DOB: 05/22/1941, 75 y.o.   MRN: [id number]  HPI   Pt in for follow up. Pt states has been on doxycyline for past 7 days. Mostly dry cough. Occasional mild mucous production on cough. But mostly dry cough. Just did not get better with doxy per pt.  Pt is coughing a lot and she is wheezing a lot at night and during the day.  Pt has been using her albuterol 3-4 times a day. Pt is on spiriva and symbicort as well.  Pt had some blood work the other day done by cardiologist. Those results not found in epic.  Pt feels some fevers, chill or sweats. She feel like this since last week.  Pt states in recent azithromycin and did ok with that.  Mild irritated area below her left nostril present for 4 days.  Pt assures me has be on azithromycin without reaction in past. Also has used hycodan in past no reaction.  No reports no chest pain. No jaw pain or arm pain. Pt explaines recent sob seems lung related and not cardiac.      Review of Systems  Constitutional: Positive for chills, fatigue and fever.  HENT: Negative for congestion.   Respiratory: Positive for cough and wheezing. Negative for apnea, chest tightness and shortness of breath.   Cardiovascular: Negative for chest pain and palpitations.  Gastrointestinal: Negative for abdominal pain.  Musculoskeletal: Negative for back pain.  Skin: Negative for rash.  Neurological: Negative for dizziness and headaches.  Hematological: Negative for adenopathy. Does not bruise/bleed easily.  Psychiatric/Behavioral: Negative for behavioral problems and confusion.    Past Medical History:  Diagnosis Date  . AAA (abdominal aortic aneurysm) ([place]) 01/2009   AAA (2.8 x 3.0)  moderate RAS (left); 2.7 x 2.7 cm (07/11/05)  . Acute bronchitis 03/20/2013; 2017  . Angina   . Anosmia   . Asthma   . Baker's cyst of knee 01/30/2014  . Basal cell carcinoma    "back and left arm"  . Bradycardia    Metoprolol stopped 08/2011  . CAD (coronary artery disease)    s/CABG (reports IMA and 2 SVGs) back in 1999  Myoview normal 3/10; s/p PCI with DES to PL branch of distal RCA 09/2011; PCI +DES to SVG-RCA, PCI + DES to mid LCx 12/2015  . Cerebrovascular disease 01/2009   carotid u/s  R 0-39%   L 60-79%  . Chronic thoracic back pain   . COPD (chronic obstructive pulmonary disease) ([place])   . Depression   . Dizziness   . Dysrhythmia    hx of sinus brady  . GERD (gastroesophageal reflux disease)   . Heart murmur   . Herniated lumbar disc without myelopathy   . History of blood transfusion 1999   "when I had the bypass; had a PE"  . Hyperglycemia 10/23/2015  . Hyperlipidemia   . Hypertension   . Medicare annual wellness visit, subsequent 07/31/2013  . NSTEMI (non-ST elevated myocardial infarction) ([place]) 11/12   Cath showed atretic IMA graft to the LAD, SVG to PD was patent but the continuation of this graft to the PL branch was occluded; there were L to R collaterals; Mid LAD had a 60 to 70% stenosis. She has been treated medically.  Neg Myoview 05/2011  . Osteoarthritis of back   . Osteoporosis   . Pneumonia "several times"  . Pulmonary embolism ([place]) 1999   "after my bypass"  .  PVD (peripheral vascular disease) ([place])   . Shortness of breath   . Squamous carcinoma    "nose"     Social History   Social History  . Marital status: Married    Spouse name: N/A  . Number of children: 5  . Years of education: N/A   Occupational History  . Retired Retired    former  Marine scientist   Social History Main Topics  . Smoking status: Former Smoker    Packs/day: 0.50    Years: 40.00    Types: Cigarettes    Quit date: 01/19/1993  . Smokeless tobacco: Never Used  . Alcohol use Yes     Comment: 01/02/2016 "might drink a glass of wine socially q 3-4 months"  . Drug use: No  . Sexual activity: Not Currently    Birth control/ protection: Post-menopausal   Other Topics Concern  . Not on file   Social  History Narrative   Married with 5 children    Past Surgical History:  Procedure Laterality Date  . ABDOMINAL AORTIC ANEURYSM REPAIR     pt denies this hx on 01/02/2016  . BASAL CELL CARCINOMA EXCISION    . CARDIAC CATHETERIZATION N/A 01/02/2016   Procedure: Left Heart Cath and Coronary Angiography;  Surgeon: Wellington Hampshire, MD;  Location: [place] [facility];  Service: Cardiovascular;  Laterality: N/A;  . CARDIAC CATHETERIZATION  1996; 1999  . CAROTID ENDARTERECTOMY Left 01/02/2011  . CATARACT EXTRACTION W/ INTRAOCULAR LENS  IMPLANT, BILATERAL    . CLOSED REDUCTION NASAL FRACTURE  11/2007  . CORONARY ANGIOPLASTY WITH STENT PLACEMENT  10/10/2011   drug eluting  to rc & saphenous  . CORONARY ARTERY BYPASS GRAFT  1999   "CABG X3"  . DILATION AND CURETTAGE OF UTERUS    . FEMORAL ARTERY STENT Bilateral   . FRACTURE SURGERY    . LEFT HEART CATHETERIZATION WITH CORONARY/GRAFT ANGIOGRAM N/A 04/22/2011   Procedure: LEFT HEART CATHETERIZATION WITH Beatrix Fetters;  Surgeon: Jolaine Artist, MD;  Location: [facility] CATH LAB;  Service: Cardiovascular;  Laterality: N/A;  . LEFT HEART CATHETERIZATION WITH CORONARY/GRAFT ANGIOGRAM N/A 10/10/2011   Procedure: LEFT HEART CATHETERIZATION WITH Beatrix Fetters;  Surgeon: Peter M Martinique, MD;  Location: [facility] CATH LAB;  Service: Cardiovascular;  Laterality: N/A;  . NASAL SINUS SURGERY     twice  . SQUAMOUS CELL CARCINOMA EXCISION    . TUBAL LIGATION      Family History  Problem Relation Age of Onset  . Heart attack Mother 65  . Diabetes Mother   . Colon cancer Father 9  . Lung cancer Father     smoked  . Heart disease Father     angina  . Hypertension Father   . Cancer Father     colon, lung  . Lung cancer Sister     smoked  . Cancer Sister   . Hypothyroidism Sister   . Hypertension Brother   . Heart attack Brother   . Heart disease Brother     stent  . Heart attack Brother     CABG  . Heart disease Brother     CABG with 1  bypass  . Aneurysm Brother     brain  . Alcohol abuse Brother   . Hyperlipidemia Daughter   . Diabetes Maternal Grandmother   . Stroke Maternal Grandmother   . Cirrhosis Maternal Grandfather   . Stroke Paternal Grandmother   . Obesity Daughter   . Obesity Son  Allergies  Allergen Reactions  . Ciprofloxacin     REACTION: rash IV  . Codeine     REACTION: nausea/vomiting  . Erythromycin     REACTION: tongue burns  . Meperidine Hcl     REACTION: Nausea/vomiting  . Penicillins     REACTION: rash  . Shellfish Allergy Nausea And Vomiting  . Tape Rash    Current Outpatient Prescriptions on File Prior to Visit  Medication Sig Dispense Refill  . acetaminophen (TYLENOL) 650 MG CR tablet Take 1,300 mg by mouth 2 (two) times daily.    Marland Kitchen albuterol (PROVENTIL HFA;VENTOLIN HFA) 108 (90 Base) MCG/ACT inhaler Inhale 2 puffs into the lungs every 6 (six) hours as needed for wheezing. 1 Inhaler 11  . benzonatate (TESSALON) 100 MG capsule Take 1 capsule (100 mg total) by mouth 2 (two) times daily as needed for cough. 20 capsule 0  . Calcium Carbonate-Vitamin D (CALCIUM + D PO) Take 1 tablet by mouth 2 (two) times daily.    . clopidogrel (PLAVIX) 75 MG tablet Take 1 tablet (75 mg total) by mouth daily. 90 tablet 3  . diclofenac sodium (VOLTAREN) 1 % GEL Apply 2 g topically 3 (three) times daily as needed (pain).    Marland Kitchen doxycycline (VIBRAMYCIN) 100 MG capsule Take 1 capsule (100 mg total) by mouth 2 (two) times daily. 14 capsule 0  . fluticasone (FLONASE) 50 MCG/ACT nasal spray Place 2 sprays into the nose 2 (two) times daily as needed for allergies. For congestion     . hydrochlorothiazide (HYDRODIURIL) 25 MG tablet Take 1 tablet (25 mg total) by mouth every morning. 90 tablet 3  . HYDROcodone-acetaminophen (NORCO/VICODIN) 5-325 MG tablet Take 1 tablet by mouth 2 (two) times daily as needed. 40 tablet 0  . isosorbide mononitrate (IMDUR) 60 MG 24 hr tablet Take 1 tablet (60 mg total) by mouth  daily. 90 tablet 3  . Krill Oil 1000 MG CAPS Take 1,000 mg by mouth daily.    Marland Kitchen losartan (COZAAR) 100 MG tablet TAKE 1 TABLET (100 MG TOTAL) BY MOUTH DAILY. 90 tablet 3  . nitroGLYCERIN (NITROSTAT) 0.4 MG SL tablet Place 1 tablet (0.4 mg total) under the tongue every 5 (five) minutes as needed for chest pain. 25 tablet 2  . pantoprazole (PROTONIX) 40 MG tablet TAKE 1 TABLET (40 MG TOTAL) BY MOUTH DAILY. 90 tablet 3  . Polyethyl Glycol-Propyl Glycol (SYSTANE OP) Place 1 drop into both eyes daily as needed (dry eyes).    . potassium chloride (K-DUR) 10 MEQ tablet Take 1 tablet (10 mEq total) by mouth daily as needed. 90 tablet 3  . rosuvastatin (CRESTOR) 40 MG tablet Take 1 tablet (40 mg total) by mouth daily. 90 tablet 3  . sertraline (ZOLOFT) 50 MG tablet TAKE 1 TABLET EVERY DAY **NEED OFFICE VISIT 90 tablet 1  . Spacer/Aero-Holding Chambers DEVI 1 spacer to use prn with HFA device as directed 1 each 0  . SYMBICORT 160-4.5 MCG/ACT inhaler INHALE 2 PUFFS INTO THE LUNGS 2 (TWO) TIMES DAILY. 10.2 Inhaler 4  . tiotropium (SPIRIVA) 18 MCG inhalation capsule Place 1 capsule (18 mcg total) into inhaler and inhale daily. 30 capsule 11   No current facility-administered medications on file prior to visit.     BP 117/70   Pulse 64   Temp 98.1 F (36.7 C) (Oral)   Ht '5\' 2"'$  (1.575 m)   Wt 142 lb 9.6 oz (64.7 kg)   SpO2 97%   BMI 26.08 kg/m  Objective:   Physical Exam   General  Mental Status - Alert. General Appearance - Well groomed. Not in acute distress.  Skin Rashes- No Rashes.  HEENT Head- Normal. Ear Auditory Canal - Left- Normal. Right - Normal.Tympanic Membrane- Left- Normal. Right- Normal. Eye Sclera/Conjunctiva- Left- Normal. Right- Normal. Nose & Sinuses Nasal Mucosa- Left-  Not Boggy and not  Congested. Right-  Not Boggy and  Not Congested.Bilateral no maxillary and no  frontal sinus pressure. Mouth & Throat Lips: Upper Lip- Normal: no dryness, cracking, pallor,  cyanosis, or vesicular eruption. Lower Lip-Normal: no dryness, cracking, pallor, cyanosis or vesicular eruption. Buccal Mucosa- Bilateral- No Aphthous ulcers. Oropharynx- No Discharge or Erythema. Tonsils: Characteristics- Bilateral- No Erythema or Congestion. Size/Enlargement- Bilateral- No enlargement. Discharge- bilateral-None.  Neck Neck- Supple. No Masses.   Chest and Lung Exam Auscultation: Breath Sounds:-even and unlabored. Shallow with some rhonchi  Cardiovascular Auscultation:Rythm- Regular, rate and rhythm. Murmurs & Other Heart Sounds:Ausculatation of the heart reveal- No Murmurs.  Lymphatic Head & Neck General Head & Neck Lymphatics: Bilateral: Description- No Localized lymphadenopathy.  Lower ext- no pedal edema. Neg homans signs bilaterally.     Assessment & Plan:  You appear to have bronchitis(some concern for pneumonia). Rest hydrate and tylenol for fever. I am prescribing cough medicine hycodan, and likely azithromycin antibiotic(will wait for cxr to come back before writing). For wheezing will rx taper prednisone.   Continue current inhalers.  Will go ahead and get chest xray and cbc today stat.  Follow up in 7-10 days or as needed  Post xray review did decide to rx azithromycin.  Monik Lins, Percell Miller, PA-C

## 2016-03-10 NOTE — Telephone Encounter (Signed)
Spoke with patient and she states that she is not sure how long she has been off of her medication. She states that it had previously been on automatic refill and she had not realized that it was out. Sent in prescription for her and reviewed the importance of her staying on this medication given her blockages that she has. She verbalized understanding and had no further questions at this time.

## 2016-03-10 NOTE — Addendum Note (Signed)
Addended by: Anabel Halon on: 03/10/2016 07:29 PM   Modules accepted: Orders

## 2016-03-10 NOTE — Patient Instructions (Addendum)
You appear to have bronchitis(some concern for pneumonia). Rest hydrate and tylenol for fever. I am prescribing cough medicine hycodan, and likely azithromycin antibiotic(will wait for cxr to come back before writing). For wheezing will rx taper prednisone.  Continue current inhalers  Will go ahead and get chest xray and cbc today stat.  Follow up in 7-10 days or as needed

## 2016-03-12 ENCOUNTER — Encounter (HOSPITAL_COMMUNITY): Payer: PPO

## 2016-03-13 ENCOUNTER — Other Ambulatory Visit: Payer: Self-pay | Admitting: Family Medicine

## 2016-03-14 ENCOUNTER — Encounter (HOSPITAL_COMMUNITY): Payer: PPO

## 2016-03-17 ENCOUNTER — Encounter (HOSPITAL_COMMUNITY): Payer: PPO

## 2016-03-19 ENCOUNTER — Encounter (HOSPITAL_COMMUNITY): Payer: PPO

## 2016-03-21 ENCOUNTER — Encounter (HOSPITAL_COMMUNITY): Payer: PPO

## 2016-03-21 ENCOUNTER — Telehealth (HOSPITAL_COMMUNITY): Payer: Self-pay | Admitting: *Deleted

## 2016-03-24 ENCOUNTER — Encounter (HOSPITAL_COMMUNITY): Payer: PPO

## 2016-03-25 ENCOUNTER — Ambulatory Visit (HOSPITAL_COMMUNITY): Payer: Self-pay | Admitting: *Deleted

## 2016-03-25 DIAGNOSIS — Z955 Presence of coronary angioplasty implant and graft: Secondary | ICD-10-CM

## 2016-03-25 NOTE — Progress Notes (Signed)
Discharge Summary  Patient Details  Name: Andrea [name] MRN: [id number] Date of Birth: November 14, 1940 Referring Provider:   Flowsheet Row CARDIAC REHAB PHASE II ORIENTATION from 01/31/2016 in [place]  Referring Provider  Kathlyn Sacramento MD       Number of Visits: 4  Reason for Discharge:  Early Exit:  non attendance  Smoking History:  History  Smoking Status  . Former Smoker  . Packs/day: 0.50  . Years: 40.00  . Types: Cigarettes  . Quit date: 01/19/1993  Smokeless Tobacco  . Never Used    Diagnosis:   01/02/16 Stented coronary artery  ADL UCSD:   Initial Exercise Prescription:     Initial Exercise Prescription - 01/31/16 1100      Date of Initial Exercise RX and Referring Provider   Date 01/31/16   Referring Provider Kathlyn Sacramento MD     Treadmill   MPH 2   Grade 1   Minutes 10   METs 2.81     Bike   Level 0.5   Minutes 10   METs 2.45     NuStep   Level 3   Watts 40   Minutes 10   METs 2     Intensity   THRR 40-80% of Max Heartrate 57-114   Ratings of Perceived Exertion 11-13   Perceived Dyspnea 0-4     Progression   Progression Continue to progress workloads to maintain intensity without signs/symptoms of physical distress.     Resistance Training   Training Prescription Yes   Weight 2   Reps 10-12      Discharge Exercise Prescription (Final Exercise Prescription Changes):     Exercise Prescription Changes - 03/04/16 1200      Response to Exercise   Blood Pressure (Admit) 120/80   Blood Pressure (Exercise) 140/78   Blood Pressure (Exit) 124/70   Heart Rate (Admit) 72 bpm   Heart Rate (Exercise) 100 bpm   Heart Rate (Exit) 70 bpm   Rating of Perceived Exertion (Exercise) 11   Comments Reviewed home exercise guidelines on 02/27/16.   Duration Progress to 30 minutes of continuous aerobic without signs/symptoms of physical distress   Intensity THRR unchanged     Progression   Progression Continue to  progress workloads to maintain intensity without signs/symptoms of physical distress.   Average METs 2.7     Resistance Training   Training Prescription Yes   Weight 2   Reps 10-12     Interval Training   Interval Training No     Treadmill   MPH 2   Grade 1   Minutes 10   METs 2.81     Bike   Level 0.5   Minutes 10   METs 2.47     NuStep   Level 3   Minutes 10   METs 2.8     Home Exercise Plan   Plans to continue exercise at Home   Frequency Add 3 additional days to program exercise sessions.      Functional Capacity:     6 Minute Walk    Row Name 01/31/16 1146 01/31/16 1205 01/31/16 1225     6 Minute Walk   Phase  - Initial Initial   Distance 1704 feet  -  -   Walk Time 6 minutes  -  -   # of Rest Breaks 0  - 0   MPH 3.23  -  -   METS 3.46  -  -  RPE 13  -  -   Perceived Dyspnea  2  -  -   VO2 Peak 12.11  -  -   Symptoms Yes (comment)  -  -   Comments mild SOB: 2  -  -   Resting HR 61 bpm  -  -   Resting BP 128/74  -  -   Max Ex. HR 100 bpm  -  -   Max Ex. BP 162/90  -  -   2 Minute Post BP 130/78  -  -      Psychological, QOL, Others - Outcomes: PHQ 2/9: Depression screen [facility] 2/9 03/10/2016 03/10/2016 02/13/2016 08/28/2015 02/08/2015  Decreased Interest 0 0 0 0 0  Down, Depressed, Hopeless 0 0 0 1 0  PHQ - 2 Score 0 0 0 1 0  Altered sleeping - - - 1 0  Tired, decreased energy - - - 2 0  Change in appetite - - - 1 0  Feeling bad or failure about yourself  - - - 0 0  Trouble concentrating - - - 1 0  Moving slowly or fidgety/restless - - - 0 0  Suicidal thoughts - - - 0 0  PHQ-9 Score - - - 6 0  Difficult doing work/chores - - - Somewhat difficult Not difficult at all  Some recent data might be hidden    Quality of Life:     Quality of Life - 01/31/16 1202      Quality of Life Scores   Health/Function Pre 27.27 %   Socioeconomic Pre 29.29 %   Psych/Spiritual Pre 29.14 %   Family Pre 27 %   GLOBAL Pre 28.03 %      Personal  Goals: Goals established at orientation with interventions provided to work toward goal.     Personal Goals and Risk Factors at Admission - 01/31/16 0913      Core Components/Risk Factors/Patient Goals on Admission    Weight Management Yes;Weight Loss   Intervention Weight Management: Develop a combined nutrition and exercise program designed to reach desired caloric intake, while maintaining appropriate intake of nutrient and fiber, sodium and fats, and appropriate energy expenditure required for the weight goal.;Weight Management: Provide education and appropriate resources to help participant work on and attain dietary goals.;Weight Management/Obesity: Establish reasonable short term and long term weight goals.   Expected Outcomes Short Term: Continue to assess and modify interventions until short term weight is achieved;Long Term: Adherence to nutrition and physical activity/exercise program aimed toward attainment of established weight goal;Weight Maintenance: Understanding of the daily nutrition guidelines, which includes 25-35% calories from fat, 7% or less cal from saturated fats, less than '200mg'$  cholesterol, less than 1.5gm of sodium, & 5 or more servings of fruits and vegetables daily;Weight Loss: Understanding of general recommendations for a balanced deficit meal plan, which promotes 1-2 lb weight loss per week and includes a negative energy balance of [phone] kcal/d;Understanding recommendations for meals to include 15-35% energy as protein, 25-35% energy from fat, 35-60% energy from carbohydrates, less than '200mg'$  of dietary cholesterol, 20-35 gm of total fiber daily;Understanding of distribution of calorie intake throughout the day with the consumption of 4-5 meals/snacks   Increase Strength and Stamina Yes   Intervention Provide advice, education, support and counseling about physical activity/exercise needs.;Develop an individualized exercise prescription for aerobic and resistive  training based on initial evaluation findings, risk stratification, comorbidities and participant's personal goals.   Expected Outcomes Achievement of increased cardiorespiratory fitness and  enhanced flexibility, muscular endurance and strength shown through measurements of functional capacity and personal statement of participant.   Intervention Provide education, individualized exercise plan and daily activity instruction to help decrease symptoms of SOB with activities of daily living.   Expected Outcomes Short Term: Achieves a reduction of symptoms when performing activities of daily living.   Hypertension Yes   Intervention Provide education on lifestyle modifcations including regular physical activity/exercise, weight management, moderate sodium restriction and increased consumption of fresh fruit, vegetables, and low fat dairy, alcohol moderation, and smoking cessation.;Monitor prescription use compliance.   Personal Goal Other Yes   Personal Goal Learn limitations and be able to function at fullest capacity   Intervention Provide education on the benefits of exercise and increase knowledge and awareness on cardiac disease   Expected Outcomes Pt will be able to live knowing limitations and be able to function at fullest capacity       Personal Goals Discharge:     Goals and Risk [place] Name 02/08/16 1032 03/04/16 1227           Core Components/Risk Factors/Patient Goals Review   Personal Goals Review Other Weight Management/Obesity;Other      Review Will provide individualized home exercise guidelines to help patient achieve fitness and weight loss goals. Home exercise guidelines reviewed, parameters for exercise discussed with participant. Participant has lost 3.3 lbs thus far.      Expected Outcomes Begin regular aerobic exercise routine to achieve fitness and weight loss goals. Continue walking 30-45 minutes, 3 days per week in addition to cardiac rehab to achieve  weight loss and fitness goals.         Nutrition & Weight - Outcomes:     Pre Biometrics - 01/31/16 1204      Pre Biometrics   Triceps Skinfold 21 mm   % Body Fat 26.8 %   Grip Strength 26 kg   Flexibility 9 in   Single Leg Stand 27.09 seconds         Post Biometrics - 02/29/16 1501       Post  Biometrics   Height '5\' 2"'$  (1.575 m)   Weight 141 lb 8.6 oz (64.2 kg)   BMI (Calculated) 25.9      Nutrition:   Nutrition Discharge:     Nutrition Assessments - 02/01/16 1025      MEDFICTS Scores   Pre Score 9      Education Questionnaire Score:     Knowledge Questionnaire Score - 01/31/16 1145      Knowledge Questionnaire Score   Pre Score 19/24      Hanifa's last day of participation was on 02/29/2016. Sherryll's attendance was sporadic. I called Arbie Cookey. Kaelea notified  us that  does not plan to return to the program due to upcoming travel plans.Harrell Gave RN BSN

## 2016-03-26 ENCOUNTER — Encounter (HOSPITAL_COMMUNITY): Payer: PPO

## 2016-03-28 ENCOUNTER — Encounter (HOSPITAL_COMMUNITY): Payer: PPO

## 2016-03-31 ENCOUNTER — Encounter (HOSPITAL_COMMUNITY): Payer: PPO

## 2016-04-01 DIAGNOSIS — D485 Neoplasm of uncertain behavior of skin: Secondary | ICD-10-CM | POA: Diagnosis not present

## 2016-04-01 DIAGNOSIS — D2272 Melanocytic nevi of left lower limb, including hip: Secondary | ICD-10-CM | POA: Diagnosis not present

## 2016-04-01 DIAGNOSIS — L570 Actinic keratosis: Secondary | ICD-10-CM | POA: Diagnosis not present

## 2016-04-01 DIAGNOSIS — L0100 Impetigo, unspecified: Secondary | ICD-10-CM | POA: Diagnosis not present

## 2016-04-01 DIAGNOSIS — D2261 Melanocytic nevi of right upper limb, including shoulder: Secondary | ICD-10-CM | POA: Diagnosis not present

## 2016-04-01 DIAGNOSIS — Z85828 Personal history of other malignant neoplasm of skin: Secondary | ICD-10-CM | POA: Diagnosis not present

## 2016-04-01 DIAGNOSIS — X32XXXA Exposure to sunlight, initial encounter: Secondary | ICD-10-CM | POA: Diagnosis not present

## 2016-04-01 DIAGNOSIS — D225 Melanocytic nevi of trunk: Secondary | ICD-10-CM | POA: Diagnosis not present

## 2016-04-02 ENCOUNTER — Encounter (HOSPITAL_COMMUNITY): Payer: PPO

## 2016-04-04 ENCOUNTER — Encounter (HOSPITAL_COMMUNITY): Payer: PPO

## 2016-04-04 NOTE — Addendum Note (Signed)
Encounter addended by: Sol Passer on: 04/04/2016  9:36 AM<BR>    Actions taken: Flowsheet data copied forward, Visit Navigator Flowsheet section accepted

## 2016-04-07 ENCOUNTER — Encounter (HOSPITAL_COMMUNITY): Payer: PPO

## 2016-04-07 NOTE — Addendum Note (Signed)
Encounter addended by: Sol Passer on: 04/07/2016  7:12 AM<BR>    Actions taken: Visit Navigator Flowsheet section accepted, Vitals modified

## 2016-04-09 ENCOUNTER — Telehealth: Payer: Self-pay | Admitting: Pulmonary Disease

## 2016-04-09 ENCOUNTER — Encounter (HOSPITAL_COMMUNITY): Payer: PPO

## 2016-04-09 MED ORDER — ALBUTEROL SULFATE HFA 108 (90 BASE) MCG/ACT IN AERS: 2.0000 | INHALATION_SPRAY | Freq: Four times a day (QID) | RESPIRATORY_TRACT | 11 refills | Status: DC | PRN

## 2016-04-09 MED ORDER — BUDESONIDE-FORMOTEROL FUMARATE 160-4.5 MCG/ACT IN AERO: INHALATION_SPRAY | RESPIRATORY_TRACT | 11 refills | Status: DC

## 2016-04-09 MED ORDER — TIOTROPIUM BROMIDE MONOHYDRATE 18 MCG IN CAPS: 18.0000 ug | ORAL_CAPSULE | Freq: Every day | RESPIRATORY_TRACT | 11 refills | Status: DC

## 2016-04-09 NOTE — Telephone Encounter (Signed)
RXs printed and will put in DS' folder to sign. Will call pt once signed. Nothing further needed.

## 2016-04-09 NOTE — Telephone Encounter (Signed)
Pt would like written rx for Symbicort, Spiriva, and Proventil. She is having them filled in [place].

## 2016-04-11 ENCOUNTER — Encounter (HOSPITAL_COMMUNITY): Payer: PPO

## 2016-04-11 NOTE — Telephone Encounter (Signed)
Patient picked up envelope at front esk.

## 2016-04-14 ENCOUNTER — Encounter (HOSPITAL_COMMUNITY): Payer: PPO

## 2016-04-15 ENCOUNTER — Encounter: Payer: Self-pay | Admitting: Cardiovascular Disease

## 2016-04-15 ENCOUNTER — Ambulatory Visit (INDEPENDENT_AMBULATORY_CARE_PROVIDER_SITE_OTHER): Payer: PPO | Admitting: Cardiovascular Disease

## 2016-04-15 VITALS — BP 140/74 | HR 70 | Ht 62.0 in | Wt 143.5 lb

## 2016-04-15 DIAGNOSIS — E7800 Pure hypercholesterolemia, unspecified: Secondary | ICD-10-CM

## 2016-04-15 DIAGNOSIS — I779 Disorder of arteries and arterioles, unspecified: Secondary | ICD-10-CM

## 2016-04-15 DIAGNOSIS — I739 Peripheral vascular disease, unspecified: Secondary | ICD-10-CM | POA: Diagnosis not present

## 2016-04-15 DIAGNOSIS — I2510 Atherosclerotic heart disease of native coronary artery without angina pectoris: Secondary | ICD-10-CM

## 2016-04-15 DIAGNOSIS — Z23 Encounter for immunization: Secondary | ICD-10-CM

## 2016-04-15 NOTE — Patient Instructions (Signed)
Medication Instructions: Continue same medications.   Labwork: None.   Procedures/Testing: None.   Follow-Up: 1 year with Dr. Abdurahman Rugg.   Any Additional Special Instructions Will Be Listed Below (If Applicable).     If you need a refill on your cardiac medications before your next appointment, please call your pharmacy.   

## 2016-04-15 NOTE — Progress Notes (Signed)
Cardiology Office Note   Date:  04/15/2016   ID:  Andrea [name], DOB [birth year], MRN [id number]  PCP:  [name], MD  Cardiologist:  Dr. Rockey Situ  Chief Complaint  Patient presents with  . other    Dr. Rockey Situ would like the pt. evaluated for follow up from carotid u/s, LE arterial bilateral, AAA u/s and ABI's.       History of Present Illness: Andrea [name] is a 75 y.o. female who was referred by Dr. Rockey Situ for evaluation of PAD. She has known history of peripheral arterial disease with previous bilateral common iliac artery stenting done in 2008 by Dr. Hoyle Barr for claudication. She has known history of coronary artery disease status post CABG in 1999 with catheterization that in August of this year by me for unstable angina. Catheterization showed subtotally occluded SVG to right coronary artery and new disease in native left circumflex. She underwent PCI and drug-eluting stent placement to both vessels. Her angina improved significantly after that. She has multiple chronic medical conditions that include carotid disease status post left CEA with moderate residual disease on the right side, hyperlipidemia, COPD and previous tobacco use. She reports bilateral leg discomfort with walking which typically does not limit her physical activities. The pain is usually in the calves and occasionally thighs. Her symptoms are equal in both sides. She had recent noninvasive vascular evaluation which showed normal ABI bilaterally with elevated velocities in the common iliac arteries bilaterally to 350 range.    Past Medical History:  Diagnosis Date  . AAA (abdominal aortic aneurysm) ([place]) 01/2009   AAA (2.8 x 3.0)  moderate RAS (left); 2.7 x 2.7 cm (07/11/05)  . Acute bronchitis 03/20/2013; 2017  . Angina   . Anosmia   . Asthma   . Baker's cyst of knee 01/30/2014  . Basal cell carcinoma    "back and left arm"  . Bradycardia    Metoprolol stopped 08/2011  . CAD (coronary artery disease)      s/CABG (reports IMA and 2 SVGs) back in 1999  Myoview normal 3/10; s/p PCI with DES to PL branch of distal RCA 09/2011; PCI +DES to SVG-RCA, PCI + DES to mid LCx 12/2015  . Cerebrovascular disease 01/2009   carotid u/s  R 0-39%   L 60-79%  . Chronic thoracic back pain   . COPD (chronic obstructive pulmonary disease) ([place])   . Depression   . Dizziness   . Dysrhythmia    hx of sinus brady  . GERD (gastroesophageal reflux disease)   . Heart murmur   . Herniated lumbar disc without myelopathy   . History of blood transfusion 1999   "when I had the bypass; had a PE"  . Hyperglycemia 10/23/2015  . Hyperlipidemia   . Hypertension   . Medicare annual wellness visit, subsequent 07/31/2013  . NSTEMI (non-ST elevated myocardial infarction) ([place]) 11/12   Cath showed atretic IMA graft to the LAD, SVG to PD was patent but the continuation of this graft to the PL branch was occluded; there were L to R collaterals; Mid LAD had a 60 to 70% stenosis. She has been treated medically.  Neg Myoview 05/2011  . Osteoarthritis of back   . Osteoporosis   . Pneumonia "several times"  . Pulmonary embolism ([place]) 1999   "after my bypass"  . PVD (peripheral vascular disease) ([place])   . Shortness of breath   . Squamous carcinoma    "nose"    Past Surgical History:  Procedure Laterality Date  . ABDOMINAL AORTIC ANEURYSM REPAIR     pt denies this hx on 01/02/2016  . BASAL CELL CARCINOMA EXCISION    . CARDIAC CATHETERIZATION N/A 01/02/2016   Procedure: Left Heart Cath and Coronary Angiography;  Surgeon: Wellington Hampshire, MD;  Location: [facility];  Service: Cardiovascular;  Laterality: N/A;  . CARDIAC CATHETERIZATION  1996; 1999  . CAROTID ENDARTERECTOMY Left 01/02/2011  . CATARACT EXTRACTION W/ INTRAOCULAR LENS  IMPLANT, BILATERAL    . CLOSED REDUCTION NASAL FRACTURE  11/2007  . CORONARY ANGIOPLASTY WITH STENT PLACEMENT  10/10/2011   drug eluting  to rc & saphenous  . CORONARY ARTERY BYPASS GRAFT  1999    "CABG X3"  . DILATION AND CURETTAGE OF UTERUS    . FEMORAL ARTERY STENT Bilateral   . FRACTURE SURGERY    . LEFT HEART CATHETERIZATION WITH CORONARY/GRAFT ANGIOGRAM N/A 04/22/2011   Procedure: LEFT HEART CATHETERIZATION WITH Beatrix Fetters;  Surgeon: Jolaine Artist, MD;  Location: [facility] CATH LAB;  Service: Cardiovascular;  Laterality: N/A;  . LEFT HEART CATHETERIZATION WITH CORONARY/GRAFT ANGIOGRAM N/A 10/10/2011   Procedure: LEFT HEART CATHETERIZATION WITH Beatrix Fetters;  Surgeon: Peter M Martinique, MD;  Location: [facility] CATH LAB;  Service: Cardiovascular;  Laterality: N/A;  . NASAL SINUS SURGERY     twice  . SQUAMOUS CELL CARCINOMA EXCISION    . TUBAL LIGATION       Current Outpatient Prescriptions  Medication Sig Dispense Refill  . acetaminophen (TYLENOL) 650 MG CR tablet Take 1,300 mg by mouth 2 (two) times daily.    Marland Kitchen albuterol (PROVENTIL HFA;VENTOLIN HFA) 108 (90 Base) MCG/ACT inhaler Inhale 2 puffs into the lungs every 6 (six) hours as needed for wheezing. 1 Inhaler 11  . azithromycin (ZITHROMAX) 250 MG tablet Take 2 tablets by mouth on day 1, followed by 1 tablet by mouth daily for 4 days. 6 tablet 0  . benzonatate (TESSALON) 100 MG capsule Take 1 capsule (100 mg total) by mouth 2 (two) times daily as needed for cough. 20 capsule 0  . budesonide-formoterol (SYMBICORT) 160-4.5 MCG/ACT inhaler INHALE 2 PUFFS INTO THE LUNGS 2 (TWO) TIMES DAILY. 10.2 Inhaler 11  . Calcium Carbonate-Vitamin D (CALCIUM + D PO) Take 1 tablet by mouth 2 (two) times daily.    . clopidogrel (PLAVIX) 75 MG tablet Take 1 tablet (75 mg total) by mouth daily. 90 tablet 3  . diclofenac sodium (VOLTAREN) 1 % GEL Apply 2 g topically 3 (three) times daily as needed (pain).    . fluticasone (FLONASE) 50 MCG/ACT nasal spray Place 2 sprays into the nose 2 (two) times daily as needed for allergies. For congestion     . hydrochlorothiazide (HYDRODIURIL) 25 MG tablet Take 1 tablet (25 mg total) by mouth every  morning. 90 tablet 3  . HYDROcodone-homatropine (HYCODAN) 5-1.5 MG/5ML syrup Take 5 mLs by mouth every 8 (eight) hours as needed for cough. 120 mL 0  . isosorbide mononitrate (IMDUR) 60 MG 24 hr tablet Take 1 tablet (60 mg total) by mouth daily. 90 tablet 3  . Krill Oil 1000 MG CAPS Take 1,000 mg by mouth daily.    Marland Kitchen losartan (COZAAR) 100 MG tablet TAKE 1 TABLET (100 MG TOTAL) BY MOUTH DAILY. 90 tablet 3  . nitroGLYCERIN (NITROSTAT) 0.4 MG SL tablet Place 1 tablet (0.4 mg total) under the tongue every 5 (five) minutes as needed for chest pain. 25 tablet 2  . pantoprazole (PROTONIX) 40 MG tablet TAKE 1  TABLET (40 MG TOTAL) BY MOUTH DAILY. 90 tablet 3  . Polyethyl Glycol-Propyl Glycol (SYSTANE OP) Place 1 drop into both eyes daily as needed (dry eyes).    . potassium chloride (K-DUR) 10 MEQ tablet Take 1 tablet (10 mEq total) by mouth daily as needed. 90 tablet 3  . rosuvastatin (CRESTOR) 40 MG tablet Take 1 tablet (40 mg total) by mouth daily. 60 tablet 11  . sertraline (ZOLOFT) 50 MG tablet TAKE 1 TABLET EVERY DAY **NEED OFFICE VISIT 90 tablet 1  . Spacer/Aero-Holding Chambers DEVI 1 spacer to use prn with HFA device as directed 1 each 0  . tiotropium (SPIRIVA) 18 MCG inhalation capsule Place 1 capsule (18 mcg total) into inhaler and inhale daily. 30 capsule 11   No current facility-administered medications for this visit.     Allergies:   Ciprofloxacin; Codeine; Erythromycin; Meperidine hcl; Penicillins; Shellfish allergy; and Tape    Social History:  The patient  reports that she quit smoking about 23 years ago. Her smoking use included Cigarettes. She has a 20.00 pack-year smoking history. She has never used smokeless tobacco. She reports that she drinks alcohol. She reports that she does not use drugs.   Family History:  The patient's family history includes Alcohol abuse in her brother; Aneurysm in her brother; Cancer in her father and sister; Cirrhosis in her maternal grandfather; Colon  cancer (age of onset: 34) in her father; Diabetes in her maternal grandmother and mother; Heart attack in her brother and brother; Heart attack (age of onset: 36) in her mother; Heart disease in her brother, brother, and father; Hyperlipidemia in her daughter; Hypertension in her brother and father; Hypothyroidism in her sister; Lung cancer in her father and sister; Obesity in her daughter and son; Stroke in her maternal grandmother and paternal grandmother.    ROS:  Please see the history of present illness.   Otherwise, review of systems are positive for none.   All other systems are reviewed and negative.    PHYSICAL EXAM: VS:  BP 140/74 (BP Location: Left Arm, Patient Position: Sitting, Cuff Size: Normal)   Pulse 70   Ht '5\' 2"'$  (1.575 m)   Wt 143 lb 8 oz (65.1 kg)   BMI 26.25 kg/m  , BMI Body mass index is 26.25 kg/m. GEN: Well nourished, well developed, in no acute distress  HEENT: normal  Neck: no JVD, carotid bruits, or masses Cardiac: RRR; no murmurs, rubs, or gallops,no edema  Respiratory:  clear to auscultation bilaterally, normal work of breathing GI: soft, nontender, nondistended, + BS MS: no deformity or atrophy  Skin: warm and dry, no rash Neuro:  Strength and sensation are intact Psych: euthymic mood, full affect Vascular: Femoral pulses are only slightly diminished bilaterally. Distal pulses are strong and palpable.  EKG:  EKG is not ordered today.   Recent Labs: 10/23/2015: TSH 3.09 01/03/2016: BUN 12; Creatinine, Ser 0.88; Potassium 4.0; Sodium 139 03/07/2016: ALT 8 03/10/2016: Hemoglobin 13.1; Platelets 262.0    Lipid Panel    Component Value Date/Time   CHOL 253 (H) 03/07/2016 0930   TRIG 188 (H) 03/07/2016 0930   HDL 32 (L) 03/07/2016 0930   CHOLHDL 7.9 (H) 03/07/2016 0930   CHOLHDL 7 10/23/2015 0918   VLDL 29.0 10/23/2015 0918   LDLCALC 183 (H) 03/07/2016 0930   LDLDIRECT 83.0 02/08/2015 1640      Wt Readings from Last 3 Encounters:  04/15/16 143  lb 8 oz (65.1 kg)  03/10/16 142 lb 9.6  oz (64.7 kg)  03/04/16 143 lb 6 oz (65 kg)       No flowsheet data found.    ASSESSMENT AND PLAN:  1.  Peripheral arterial disease: Previous bilateral common iliac artery stenting in 2008. Currently with mild bilateral leg claudication. Her symptoms are currently not lifestyle limiting. Recent noninvasive vascular evaluation was overall reassuring with normal ABI. The velocities in the common iliac arteries were borderline elevated but I don't think they weren't critical range. Given that her symptoms are overall mild non-lifestyle limiting, I recommend continuing medical therapy. I will plan on yearly follow-up with me in the symptoms worsen.  2. Coronary artery disease involving native coronary arteries without angina: Significant improvement in symptoms after stenting in August. I recommend lifelong treatment with clopidogrel.  3. Bilateral carotid disease status post left CEA with residual moderate right carotid artery stenosis. Most recent study was in October of this year. I recommend repeat study next year.  4. Hyperlipidemia: Her LDL in October was 183. Since then, she was started on rosuvastatin 40 mg once daily which I agree with. We should aim for a target LDL of less than 70.   Disposition:   FU with me in 1 year  Signed,  Kathlyn Sacramento, MD  04/15/2016 4:19 PM    [facility]

## 2016-04-16 ENCOUNTER — Encounter (HOSPITAL_COMMUNITY): Payer: PPO

## 2016-04-21 ENCOUNTER — Encounter (HOSPITAL_COMMUNITY): Payer: PPO

## 2016-04-23 ENCOUNTER — Encounter (HOSPITAL_COMMUNITY): Payer: PPO

## 2016-04-24 ENCOUNTER — Other Ambulatory Visit: Payer: Self-pay | Admitting: Family Medicine

## 2016-04-25 ENCOUNTER — Encounter (HOSPITAL_COMMUNITY): Payer: PPO

## 2016-04-25 ENCOUNTER — Ambulatory Visit: Payer: PPO | Admitting: Family Medicine

## 2016-04-28 ENCOUNTER — Encounter (HOSPITAL_COMMUNITY): Payer: PPO

## 2016-04-30 ENCOUNTER — Encounter (HOSPITAL_COMMUNITY): Payer: PPO

## 2016-05-02 ENCOUNTER — Encounter (HOSPITAL_COMMUNITY): Payer: PPO

## 2016-05-05 ENCOUNTER — Encounter (HOSPITAL_COMMUNITY): Payer: PPO

## 2016-05-07 ENCOUNTER — Encounter (HOSPITAL_COMMUNITY): Payer: PPO

## 2016-05-09 ENCOUNTER — Encounter (HOSPITAL_COMMUNITY): Payer: PPO

## 2016-05-12 ENCOUNTER — Encounter (HOSPITAL_COMMUNITY): Payer: PPO

## 2016-05-12 ENCOUNTER — Other Ambulatory Visit: Payer: Self-pay | Admitting: Cardiovascular Disease

## 2016-05-14 ENCOUNTER — Encounter (HOSPITAL_COMMUNITY): Payer: PPO

## 2016-05-16 ENCOUNTER — Encounter (HOSPITAL_COMMUNITY): Payer: PPO

## 2016-05-21 ENCOUNTER — Encounter (HOSPITAL_COMMUNITY): Payer: PPO

## 2016-05-22 ENCOUNTER — Ambulatory Visit (INDEPENDENT_AMBULATORY_CARE_PROVIDER_SITE_OTHER): Payer: PPO | Admitting: Medical

## 2016-05-22 ENCOUNTER — Encounter: Payer: Self-pay | Admitting: Medical

## 2016-05-22 VITALS — BP 132/80 | HR 80 | Temp 98.2°F | Wt 141.4 lb

## 2016-05-22 DIAGNOSIS — J209 Acute bronchitis, unspecified: Secondary | ICD-10-CM

## 2016-05-22 DIAGNOSIS — H109 Unspecified conjunctivitis: Secondary | ICD-10-CM | POA: Diagnosis not present

## 2016-05-22 DIAGNOSIS — R062 Wheezing: Secondary | ICD-10-CM | POA: Diagnosis not present

## 2016-05-22 DIAGNOSIS — J0100 Acute maxillary sinusitis, unspecified: Secondary | ICD-10-CM | POA: Diagnosis not present

## 2016-05-22 MED ORDER — BENZONATATE 100 MG PO CAPS: 100.0000 mg | ORAL_CAPSULE | Freq: Three times a day (TID) | ORAL | 0 refills | Status: DC | PRN

## 2016-05-22 MED ORDER — FLUTICASONE PROPIONATE 50 MCG/ACT NA SUSP: 2.0000 | Freq: Every day | NASAL | 0 refills | Status: DC

## 2016-05-22 MED ORDER — HYDROCODONE-ACETAMINOPHEN 10-325 MG PO TABS: 1.0000 | ORAL_TABLET | Freq: Two times a day (BID) | ORAL | 0 refills | Status: DC

## 2016-05-22 MED ORDER — TOBRAMYCIN 0.3 % OP SOLN: 2.0000 [drp] | Freq: Four times a day (QID) | OPHTHALMIC | 0 refills | Status: DC

## 2016-05-22 MED ORDER — PREDNISONE 10 MG PO TABS: ORAL_TABLET | ORAL | 0 refills | Status: DC

## 2016-05-22 MED ORDER — OSELTAMIVIR PHOSPHATE 75 MG PO CAPS: 75.0000 mg | ORAL_CAPSULE | Freq: Two times a day (BID) | ORAL | 0 refills | Status: DC

## 2016-05-22 MED ORDER — HYDROCODONE-ACETAMINOPHEN 10-325 MG PO TABS: 1.0000 | ORAL_TABLET | Freq: Four times a day (QID) | ORAL | 0 refills | Status: DC | PRN

## 2016-05-22 MED ORDER — AZITHROMYCIN 250 MG PO TABS: ORAL_TABLET | ORAL | 0 refills | Status: DC

## 2016-05-22 NOTE — Patient Instructions (Addendum)
You appear to have bronchitis and sinusitis. Rest hydrate and tylenol for fever. I am prescribing cough medicine benzonatate, and azithromycin antibiotic. For your nasal congestion flonase.  For wheezing continue symbicort, spiriva and albuterol If wheezing worsens depsite using all meds then start taper prednisone.  Tobrex eye drops rx written if recurrent eye dc/matting.  Rx tamiflu due to concern for the flu due to body aches.    You should gradually get better. If not then notify us and would recommend a chest xray.  Follow up in 7-10 days or as needed

## 2016-05-22 NOTE — Progress Notes (Signed)
Subjective:    [name], female    DOB: [birth year], 75 y.o.   MRN: [id number]  HPI  Pt in states she states some nasal and chest congestion. Started nasal and sinus pressure  first. And eventually  some chest congestion. Pt had symptoms for 5 days. Gradually getting worse. Some chills.   Pt has some shoulder and back pain. This started yesterday.(she is not sure if flu like)  Pt reports no angina/chest pain.  Some slight worse wheezing recent and slight worse cough.  Some recent eye dc matting in am both eyes past 3 days.      Review of Systems  Constitutional: Positive for chills and fatigue. Negative for fever.  HENT: Positive for congestion and sinus pressure. Negative for ear pain, postnasal drip and sore throat.   Respiratory: Positive for cough and wheezing. Negative for shortness of breath.   Cardiovascular: Negative for chest pain and palpitations.  Gastrointestinal: Negative for abdominal pain.  Musculoskeletal: Positive for myalgias.  Neurological: Negative for dizziness, weakness and light-headedness.  Hematological: Negative for adenopathy. Does not bruise/bleed easily.  Psychiatric/Behavioral: Negative for behavioral problems and confusion.    Past Medical History:  Diagnosis Date  . AAA (abdominal aortic aneurysm) ([place]) 01/2009   AAA (2.8 x 3.0)  moderate RAS (left); 2.7 x 2.7 cm (07/11/05)  . Acute bronchitis 03/20/2013; 2017  . Angina   . Anosmia   . Asthma   . Baker's cyst of knee 01/30/2014  . Basal cell carcinoma    "back and left arm"  . Bradycardia    Metoprolol stopped 08/2011  . CAD (coronary artery disease)    s/CABG (reports IMA and 2 SVGs) back in 1999  Myoview normal 3/10; s/p PCI with DES to PL branch of distal RCA 09/2011; PCI +DES to SVG-RCA, PCI + DES to mid LCx 12/2015  . Cerebrovascular disease 01/2009   carotid u/s  R 0-39%   L 60-79%  . Chronic thoracic back pain   . COPD (chronic obstructive pulmonary disease) ([place])     . Depression   . Dizziness   . Dysrhythmia    hx of sinus brady  . GERD (gastroesophageal reflux disease)   . Heart murmur   . Herniated lumbar disc without myelopathy   . History of blood transfusion 1999   "when I had the bypass; had a PE"  . Hyperglycemia 10/23/2015  . Hyperlipidemia   . Hypertension   . Medicare annual wellness visit, subsequent 07/31/2013  . NSTEMI (non-ST elevated myocardial infarction) ([place]) 11/12   Cath showed atretic IMA graft to the LAD, SVG to PD was patent but the continuation of this graft to the PL branch was occluded; there were L to R collaterals; Mid LAD had a 60 to 70% stenosis. She has been treated medically.  Neg Myoview 05/2011  . Osteoarthritis of back   . Osteoporosis   . Pneumonia "several times"  . Pulmonary embolism ([place]) 1999   "after my bypass"  . PVD (peripheral vascular disease) ([place])   . Shortness of breath   . Squamous carcinoma    "nose"     Social History   Social History  . Marital status: Married    Spouse name: N/A  . Number of children: 5  . Years of education: N/A   Occupational History  . Retired Retired    former  Marine scientist   Social History Main Topics  . Smoking status: Former Smoker    Packs/day: 0.50  Years: 40.00    Types: Cigarettes    Quit date: 01/19/1993  . Smokeless tobacco: Never Used  . Alcohol use Yes     Comment: 01/02/2016 "might drink a glass of wine socially q 3-4 months"  . Drug use: No  . Sexual activity: Not Currently    Birth control/ protection: Post-menopausal   Other Topics Concern  . Not on file   Social History Narrative   Married with 5 children    Past Surgical History:  Procedure Laterality Date  . ABDOMINAL AORTIC ANEURYSM REPAIR     pt denies this hx on 01/02/2016  . BASAL CELL CARCINOMA EXCISION    . CARDIAC CATHETERIZATION N/A 01/02/2016   Procedure: Left Heart Cath and Coronary Angiography;  Surgeon: Wellington Hampshire, MD;  Location: [place] [facility];  Service:  Cardiovascular;  Laterality: N/A;  . CARDIAC CATHETERIZATION  1996; 1999  . CAROTID ENDARTERECTOMY Left 01/02/2011  . CATARACT EXTRACTION W/ INTRAOCULAR LENS  IMPLANT, BILATERAL    . CLOSED REDUCTION NASAL FRACTURE  11/2007  . CORONARY ANGIOPLASTY WITH STENT PLACEMENT  10/10/2011   drug eluting  to rc & saphenous  . CORONARY ARTERY BYPASS GRAFT  1999   "CABG X3"  . DILATION AND CURETTAGE OF UTERUS    . FEMORAL ARTERY STENT Bilateral   . FRACTURE SURGERY    . LEFT HEART CATHETERIZATION WITH CORONARY/GRAFT ANGIOGRAM N/A 04/22/2011   Procedure: LEFT HEART CATHETERIZATION WITH Beatrix Fetters;  Surgeon: Jolaine Artist, MD;  Location: [facility] C[facility] LAB;  Service: Cardiovascular;  Laterality: N/A;  . LEFT HEART CATHETERIZATION WITH CORONARY/GRAFT ANGIOGRAM N/A 10/10/2011   Procedure: LEFT HEART CATHETERIZATION WITH Beatrix Fetters;  Surgeon: Peter M Martinique, MD;  Location: [facility] CATH LAB;  Service: Cardiovascular;  Laterality: N/A;  . NASAL SINUS SURGERY     twice  . SQUAMOUS CELL CARCINOMA EXCISION    . TUBAL LIGATION      Family History  Problem Relation Age of Onset  . Heart attack Mother 38  . Diabetes Mother   . Colon cancer Father 46  . Lung cancer Father     smoked  . Heart disease Father     angina  . Hypertension Father   . Cancer Father     colon, lung  . Lung cancer Sister     smoked  . Cancer Sister   . Hypothyroidism Sister   . Hypertension Brother   . Heart attack Brother   . Heart disease Brother     stent  . Heart attack Brother     CABG  . Heart disease Brother     CABG with 1 bypass  . Aneurysm Brother     brain  . Alcohol abuse Brother   . Hyperlipidemia Daughter   . Diabetes Maternal Grandmother   . Stroke Maternal Grandmother   . Cirrhosis Maternal Grandfather   . Stroke Paternal Grandmother   . Obesity Daughter   . Obesity Son     Allergies  Allergen Reactions  . Ciprofloxacin     REACTION: rash IV  . Codeine     REACTION:  nausea/vomiting  . Erythromycin     REACTION: tongue burns  . Meperidine Hcl     REACTION: Nausea/vomiting  . Penicillins     REACTION: rash  . Shellfish Allergy Nausea And Vomiting  . Tape Rash    Current Outpatient Prescriptions on File Prior to Visit  Medication Sig Dispense Refill  . acetaminophen (TYLENOL) 650 MG CR tablet  Take 1,300 mg by mouth 2 (two) times daily.    Marland Kitchen albuterol (PROVENTIL HFA;VENTOLIN HFA) 108 (90 Base) MCG/ACT inhaler Inhale 2 puffs into the lungs every 6 (six) hours as needed for wheezing. 1 Inhaler 11  . azithromycin (ZITHROMAX) 250 MG tablet Take 2 tablets by mouth on day 1, followed by 1 tablet by mouth daily for 4 days. 6 tablet 0  . benzonatate (TESSALON) 100 MG capsule Take 1 capsule (100 mg total) by mouth 2 (two) times daily as needed for cough. 20 capsule 0  . budesonide-formoterol (SYMBICORT) 160-4.5 MCG/ACT inhaler INHALE 2 PUFFS INTO THE LUNGS 2 (TWO) TIMES DAILY. 10.2 Inhaler 11  . Calcium Carbonate-Vitamin D (CALCIUM + D PO) Take 1 tablet by mouth 2 (two) times daily.    . clopidogrel (PLAVIX) 75 MG tablet Take 1 tablet (75 mg total) by mouth daily. 90 tablet 3  . diclofenac sodium (VOLTAREN) 1 % GEL Apply 2 g topically 3 (three) times daily as needed (pain).    . fluticasone (FLONASE) 50 MCG/ACT nasal spray Place 2 sprays into the nose 2 (two) times daily as needed for allergies. For congestion     . hydrochlorothiazide (HYDRODIURIL) 25 MG tablet Take 1 tablet (25 mg total) by mouth every morning. 90 tablet 3  . HYDROcodone-homatropine (HYCODAN) 5-1.5 MG/5ML syrup Take 5 mLs by mouth every 8 (eight) hours as needed for cough. 120 mL 0  . isosorbide mononitrate (IMDUR) 30 MG 24 hr tablet TAKE 1 TABLET (30 MG TOTAL) BY MOUTH DAILY. 90 tablet 3  . isosorbide mononitrate (IMDUR) 60 MG 24 hr tablet Take 1 tablet (60 mg total) by mouth daily. 90 tablet 3  . Krill Oil 1000 MG CAPS Take 1,000 mg by mouth daily.    Marland Kitchen losartan (COZAAR) 100 MG tablet TAKE 1  TABLET (100 MG TOTAL) BY MOUTH DAILY. 90 tablet 3  . nitroGLYCERIN (NITROSTAT) 0.4 MG SL tablet Place 1 tablet (0.4 mg total) under the tongue every 5 (five) minutes as needed for chest pain. 25 tablet 2  . pantoprazole (PROTONIX) 40 MG tablet TAKE 1 TABLET (40 MG TOTAL) BY MOUTH DAILY. 90 tablet 3  . Polyethyl Glycol-Propyl Glycol (SYSTANE OP) Place 1 drop into both eyes daily as needed (dry eyes).    . potassium chloride (K-DUR) 10 MEQ tablet Take 1 tablet (10 mEq total) by mouth daily as needed. 90 tablet 3  . rosuvastatin (CRESTOR) 40 MG tablet Take 1 tablet (40 mg total) by mouth daily. 60 tablet 11  . sertraline (ZOLOFT) 50 MG tablet TAKE 1 TABLET EVERY DAY **NEED OFFICE VISIT 90 tablet 1  . Spacer/Aero-Holding Chambers DEVI 1 spacer to use prn with HFA device as directed 1 each 0  . tiotropium (SPIRIVA) 18 MCG inhalation capsule Place 1 capsule (18 mcg total) into inhaler and inhale daily. 30 capsule 11   No current facility-administered medications on file prior to visit.     BP 132/80 (BP Location: Left Arm, Patient Position: Sitting, Cuff Size: Normal)   Pulse 80   Temp 98.2 F (36.8 C) (Oral)   Wt 141 lb 6.4 oz (64.1 kg)   SpO2 97%   BMI 25.86 kg/m       Objective:   Physical Exam  General  Mental Status - Alert. General Appearance - Well groomed. Not in acute distress.  Skin Rashes- No Rashes.  HEENT Head- Normal. Ear Auditory Canal - Left- Normal. Right - Normal.Tympanic Membrane- Left- Normal. Right- Normal. Eye Sclera/Conjunctiva- mild injected  conjunctiva bilateral but no dc. Nose & Sinuses Nasal Mucosa- Left-  Boggy and Congested. Right-  Boggy and  Congested.Bilateral maxillary and frontal sinus pressure. Mouth & Throat Lips: Upper Lip- Normal: no dryness, cracking, pallor, cyanosis, or vesicular eruption. Lower Lip-Normal: no dryness, cracking, pallor, cyanosis or vesicular eruption. Buccal Mucosa- Bilateral- No Aphthous ulcers. Oropharynx- No Discharge  or Erythema. Tonsils: Characteristics- Bilateral- No Erythema or Congestion. Size/Enlargement- Bilateral- No enlargement. Discharge- bilateral-None.  Neck Neck- Supple. No Masses.   Chest and Lung Exam Auscultation: Breath Sounds:-Clear even and unlabored.  Cardiovascular Auscultation:Rythm- Regular, rate and rhythm. Murmurs & Other Heart Sounds:Ausculatation of the heart reveal- No Murmurs.  Lymphatic Head & Neck General Head & Neck Lymphatics: Bilateral: Description- No Localized lymphadenopathy.       Assessment & Plan:   You appear to have bronchitis and sinusitis. Rest hydrate and tylenol for fever. I am prescribing cough medicine benzonatate, and azithromycin antibiotic. For your nasal congestion flonase.  For wheezing continue symbicort, spiriva and albuterol If wheezing worsens depsite using all meds then start taper prednisone.  Tobrex eye drops rx written if recurrent eye dc/matting.  Rx tamiflu due to concern for the flu due to body aches.    You should gradually get better. If not then notify us and would recommend a chest xray.  Follow up in 7-10 days or as needed  I did refill pt norco for her back pain. Limited rx until she can contact Dr. Jerline Pain, Percell Miller, PA-C

## 2016-05-22 NOTE — Progress Notes (Signed)
Pre visit review using our clinic review tool, if applicable. No additional management support is needed unless otherwise documented below in the visit note. 

## 2016-05-23 ENCOUNTER — Encounter (HOSPITAL_COMMUNITY): Payer: PPO

## 2016-05-28 ENCOUNTER — Encounter (HOSPITAL_COMMUNITY): Payer: PPO

## 2016-05-29 ENCOUNTER — Other Ambulatory Visit: Payer: Self-pay

## 2016-05-30 ENCOUNTER — Telehealth: Payer: Self-pay | Admitting: Cardiovascular Disease

## 2016-05-30 ENCOUNTER — Encounter (HOSPITAL_COMMUNITY): Payer: PPO

## 2016-05-30 NOTE — Telephone Encounter (Signed)
Lvm asking pt to call office to verify which pharmacy she would like to use being that she has refills available at [facility] and new refill request for [facility] today.

## 2016-05-30 NOTE — Telephone Encounter (Signed)
Lvm for pt to call office to verify pharmacy.

## 2016-06-02 ENCOUNTER — Encounter (HOSPITAL_COMMUNITY): Payer: PPO

## 2016-06-13 MED ORDER — ROSUVASTATIN CALCIUM 40 MG PO TABS: 40.0000 mg | ORAL_TABLET | Freq: Every day | ORAL | 3 refills | Status: DC

## 2016-06-16 ENCOUNTER — Telehealth: Payer: Self-pay | Admitting: Cardiovascular Disease

## 2016-06-16 NOTE — Telephone Encounter (Signed)
Pt states she has had several episodes of angina.  Also her BP is elevated  Pt c/o BP issue: STAT if pt c/o blurred vision, one-sided weakness or slurred speech  1. What are your last 5 BP readings?  1/22-203/134 1/21-165/98 1/20-168/98, 172/90   2. Are you having any other symptoms (ex. Dizziness, headache, blurred vision, passed out)? No, just severe CP, across shoulder and up into her jaw  3. What is your BP issue? Elevated Pt is seeing dr Rockey Situ tomorrow 1/23 at 2pm

## 2016-06-16 NOTE — Telephone Encounter (Signed)
Pt is coming to see you tomorrow @ 2:00. Do you want to make any changes before that time?

## 2016-06-17 ENCOUNTER — Ambulatory Visit (INDEPENDENT_AMBULATORY_CARE_PROVIDER_SITE_OTHER): Payer: PPO | Admitting: Cardiovascular Disease

## 2016-06-17 ENCOUNTER — Encounter: Payer: Self-pay | Admitting: Cardiovascular Disease

## 2016-06-17 VITALS — BP 120/72 | HR 63 | Ht 62.0 in | Wt 140.5 lb

## 2016-06-17 DIAGNOSIS — I200 Unstable angina: Secondary | ICD-10-CM

## 2016-06-17 DIAGNOSIS — Z951 Presence of aortocoronary bypass graft: Secondary | ICD-10-CM | POA: Diagnosis not present

## 2016-06-17 DIAGNOSIS — I6523 Occlusion and stenosis of bilateral carotid arteries: Secondary | ICD-10-CM | POA: Diagnosis not present

## 2016-06-17 DIAGNOSIS — I208 Other forms of angina pectoris: Secondary | ICD-10-CM | POA: Insufficient documentation

## 2016-06-17 DIAGNOSIS — I2583 Coronary atherosclerosis due to lipid rich plaque: Secondary | ICD-10-CM | POA: Diagnosis not present

## 2016-06-17 DIAGNOSIS — I2510 Atherosclerotic heart disease of native coronary artery without angina pectoris: Secondary | ICD-10-CM

## 2016-06-17 DIAGNOSIS — E782 Mixed hyperlipidemia: Secondary | ICD-10-CM | POA: Diagnosis not present

## 2016-06-17 DIAGNOSIS — I10 Essential (primary) hypertension: Secondary | ICD-10-CM

## 2016-06-17 MED ORDER — ISOSORBIDE MONONITRATE ER 60 MG PO TB24: 60.0000 mg | ORAL_TABLET | Freq: Two times a day (BID) | ORAL | 3 refills | Status: DC

## 2016-06-17 MED ORDER — METOPROLOL TARTRATE 25 MG PO TABS: 12.5000 mg | ORAL_TABLET | Freq: Two times a day (BID) | ORAL | 3 refills | Status: DC

## 2016-06-17 NOTE — Patient Instructions (Addendum)
Medication Instructions:   Please stop the HCTZ, losartan  Please increase isosorbide up to 60 mg twice a day  Please start metoprolol 1/2 pill twice a day  Labwork:  No new labs needed  Testing/Procedures:  We will schedule a cardiac cath for unstable angina    I recommend watching educational videos on topics of interest to you at:       [email or web address]  Enter code: HEARTCARE    Follow-Up: It was a pleasure seeing you in the office today. Please call us if you have new issues that need to be addressed before your next appt.  [phone]  Your physician wants you to follow-up in: 1 month.    If you need a refill on your cardiac medications before your next appointment, please call your pharmacy.   You are scheduled for a Cardiac Catheterization / Peripheral Vascular Angiogram on Wednesday, January 31 with Dr. Fletcher Anon.  Please arrive at the [place] [facility] at 8:30am on the day of your procedure.  1. Nothing to eat or drink after midnight. 2. You should take your medication as usual with a sip of water; this includes your aspirin and Plavix / Effient / Brilinta. 3. If you are diabetic, do NOT take your diabetic medication(s) or insulin the morning of the procedure.  These will be given to you after the procedure. 4. If you are taking any medication with Glucophage (Metformin) in it, do NOT take your dose the day before or the day of your procedure.  You will be instructed when to re-start your medication. 5. Plan for one night stay - bring personal belongings (i.e. Toothpaste, toothbrush, etc.). 6. Bring a current list of your medications and current insurance cards. 7. Must have a responsible person to drive you home. 8. Someone must be with you for the first 24 hours after you arrive home. 9. Please wear clothes that are easy to get on and off and wear slip-on shoes.  * Special note:  Every effort is made to have  your procedure done on time.  Occasionally there are emergencies that present themselves at the hospital that may cause delays.  Please be patient if a delay does occur.  If you have any questions after you get home, please call the office at [phone] ext [phone].    Angiogram An angiogram is an X-ray test. It is used to look at your blood vessels. For this test, a dye is put into the blood vessel being checked. The dye shows up on X-rays. It helps your doctor see if there is a blockage or other problem in the blood vessel. What happens before the procedure?  Follow your doctor's instructions about limiting what you eat or drink.  Ask your doctor if you may drink enough water to take any needed medicines the morning of the test.  Plan to have someone take you home after the test.  If you go home the same day as the test, plan to have someone stay with you for 24 hours. What happens during the procedure?  An IV tube will be put into one of your veins.  You will be given a medicine that makes you relax (sedative).  Your skin will be washed where the thin tube (catheter) will be put in. Hair may be removed from this area. The tube may be put into:  Your upper leg area (groin).  The fold of your arm, near your  elbow.  Your wrist.  You will be given a medicine that numbs the area where the tube will be inserted (local anesthetic).  The tube will be inserted into a blood vessel.  Using a type of X-ray (fluoroscopy) to see, your doctor will move the tube into the blood vessel to check it.  Dye will be put in through the tube. X-rays of your blood vessels will then be taken. Different doctors and hospitals may do this procedure differently. What happens after the procedure?  If the test is done through the leg, you will be kept in bed lying flat for several hours. You will be told to not bend or cross your legs.  The area where the tube was inserted will be checked often.  The pulse  in your feet or wrist will be checked often.  More tests or X-rays may be done. This information is not intended to replace advice given to you by your health care provider. Make sure you discuss any questions you have with your health care provider. Document Released: 08/08/2008 Document Revised: 10/18/2015 Document Reviewed: 10/13/2012 Elsevier Interactive Patient Education  2017 [employer] After These instructions give you information about caring for yourself after your procedure. Your doctor may also give you more specific instructions. Call your doctor if you have any problems or questions after your procedure. Follow these instructions at home:  Take medicines only as told by your doctor.  Follow your doctor's instructions about:  Care of the area where the tube was inserted.  Bandage (dressing) changes and removal.  You may shower 24-48 hours after the procedure or as told by your doctor.  Do not take baths, swim, or use a hot tub until your doctor approves.  Every day, check the area where the tube was inserted. Watch for:  Redness, swelling, or pain.  Fluid, blood, or pus.  Do not apply powder or lotion to the site.  Do not lift anything that is heavier than 10 lb (4.5 kg) for 5 days or as told by your doctor.  Ask your doctor when you can:  Return to work or school.  Do physical activities or play sports.  Have sex.  Do not drive or operate heavy machinery for 24 hours or as told by your doctor.  Have someone with you for the first 24 hours after the procedure.  Keep all follow-up visits as told by your doctor. This is important. Contact a health care provider if:  You have a fever.  You have chills.  You have more bleeding from the area where the tube was inserted. Hold pressure on the area.  You have redness, swelling, or pain in the area where the tube was inserted.  You have fluid or pus coming from the area. Get help right  away if:  You have a lot of pain in the area where the tube was inserted.  The area where the tube was inserted is bleeding, and the bleeding does not stop after 30 minutes of holding steady pressure on the area.  The area near or just beyond the insertion site becomes pale, cool, tingly, or numb. This information is not intended to replace advice given to you by your health care provider. Make sure you discuss any questions you have with your health care provider. Document Released: 08/08/2008 Document Revised: 10/18/2015 Document Reviewed: 10/13/2012 Elsevier Interactive Patient Education  2017 [employer].

## 2016-06-17 NOTE — Progress Notes (Signed)
Cardiology Office Note  Date:  06/17/2016   ID:  Andrea [name], DOB [birth year], MRN [id number]  PCP:  [name], MD   Chief Complaint  Patient presents with  . other    73mof/u. Pt c/o of chest pain, sob, high BP. Reviewed meds with pt verbally.    HPI:  Ms. KHoriuchiis a pleasant 76year old woman with history of chronic bradycardia, spinal stenosis, peripheral vascular disease, status post CEA on the left , 40-59% residual disease on the right, hyperlipidemia,  coronary artery disease and bypass surgery in 1999, stent placement to distal RCA may 2013, In August 2017 she had 2 stents placed to vein graft to the RCA, also with stents to the mid left circumflex with resolution of her anginal symptoms She presents for routine followup Of her coronary artery disease . She has underlying COPD, long [facility] at least [facility] [place] [facility] She is part of the bio freedom study  In follow-up today, she reports that she is not doing well Having worsening unstable angina symptoms over the past 2 months She started writing down symptoms starting mid-December, they have been getting progressively worse Cold triggers chest pain, Walking triggers pain,  Taking NTG sometimes once a day, twice a day This is been a bad week for her, taking one or 2 nitroglycerin daily Also feels exhausted, this is a new symptom for her She reports symptoms are similar to previous anginal symptoms in August 2017  EKG on today's visit shows normal sinus rhythm with rate 63 bpm, diffuse T-wave abnormality through the anterior precordial leads, inferior leads, worsening T-wave abnormality when compared to prior EKGs  Other past medical history reviewed cardiac catheterization 01/02/2016 She had 2 stents placed to the vein graft to the RCA, also angioplasty with stent placement to the mid left circumflex Following intervention she feels much  better, no chest pain, shortness of breath improved Previously was taking nitroglycerin for anginal symptoms, now no longer needing nitroglycerin Was taking NTG 5 x in one day  She has a history of stents to her lower extremities, these have not been visualized in many years  Cath 01/02/16 Significant underlying three-vessel coronary artery disease. Patent LIMA to LAD. However, there is retrograde flow in the graft itself likely due to no obstructive disease down the native LAD. The native LAD has diffuse 60-70% calcified disease in the proximal and midsegment. Severe new disease in SVG to RCA. Collaterals were noted from left-to-right. Worsening disease in the native left circumflex which does not have a patent bypass.  2. Moderate left subclavian stenosis which was not significant by gradients. 3. Normal left ventricular end-diastolic pressure. Left ventricular angiography was not performed. 4. Successful angioplasty and drug-eluting stent placement to the SVG to RCA with 2 bio freedom stent placement. Successful angioplasty and stent placement to the mid left circumflex with 1 I freedom stent.  The SVG to RCA is an old graft with new areas of disease. I think this graft is high risk for closure in spite of intervention today. The patient was enrolled in the bio freedom study and thus dual antiplatelet therapy is optional after one month. However, I recommend continuing Plavix indefinitely. The patient did have significant chest pain during SVG to RCA PCI with ST changes. By the end of the procedure, her chest pain resolved completely. However, mild ST elevation persisted possibly due to microvascular injury.  catheterization November 2012 Atretic LIMA to the LAD,  She has saphenous vein graft sequential to PDA and PL loss of distal limb of vein graft to the PDA and PL with left-to-right collaterals 40-50% proximal to mid LAD with focal 60-70% mid LAD 2 large OM's, 30% mid OM1, 40-50%  ostial Ejection fraction 55% with inferobasal akinesis   previously stopped her Crestor, cholesterol from 144 up to 242 in May 30. 2017  PMH:   has a past medical history of AAA (abdominal aortic aneurysm) ([place]) (01/2009); Acute bronchitis (03/20/2013; 2017); Angina; Anosmia; Asthma; Baker's cyst of knee (01/30/2014); Basal cell carcinoma; Bradycardia; CAD (coronary artery disease); Cerebrovascular disease (01/2009); Chronic thoracic back pain; COPD (chronic obstructive pulmonary disease) ([place]); Depression; Dizziness; Dysrhythmia; GERD (gastroesophageal reflux disease); Heart murmur; Herniated lumbar disc without myelopathy; History of blood transfusion (1999); Hyperglycemia (10/23/2015); Hyperlipidemia; Hypertension; Medicare annual wellness visit, subsequent (07/31/2013); NSTEMI (non-ST elevated myocardial infarction) ([place]) (11/12); Osteoarthritis of back; Osteoporosis; Pneumonia ("several times"); Pulmonary embolism ([place]) (1999); PVD (peripheral vascular disease) ([place]); Shortness of breath; and Squamous carcinoma.  PSH:    Past Surgical History:  Procedure Laterality Date  . ABDOMINAL AORTIC ANEURYSM REPAIR     pt denies this hx on 01/02/2016  . BASAL CELL CARCINOMA EXCISION    . CARDIAC CATHETERIZATION N/A 01/02/2016   Procedure: Left Heart Cath and Coronary Angiography;  Surgeon: Wellington Hampshire, MD;  Location: [place] [facility];  Service: Cardiovascular;  Laterality: N/A;  . CARDIAC CATHETERIZATION  1996; 1999  . CAROTID ENDARTERECTOMY Left 01/02/2011  . CATARACT EXTRACTION W/ INTRAOCULAR LENS  IMPLANT, BILATERAL    . CLOSED REDUCTION NASAL FRACTURE  11/2007  . CORONARY ANGIOPLASTY WITH STENT PLACEMENT  10/10/2011   drug eluting  to rc & saphenous  . CORONARY ARTERY BYPASS GRAFT  1999   "CABG X3"  . DILATION AND CURETTAGE OF UTERUS    . FEMORAL ARTERY STENT Bilateral   . FRACTURE SURGERY    . LEFT HEART CATHETERIZATION WITH CORONARY/GRAFT ANGIOGRAM N/A 04/22/2011   Procedure: LEFT HEART  CATHETERIZATION WITH Beatrix Fetters;  Surgeon: Jolaine Artist, MD;  Location[facility] CATH LAB;  Service: Cardiovascular;  Laterality: N/A;  . LEFT HEART CATHETERIZATION WITH CORONARY/GRAFT ANGIOGRAM N/A 10/10/2011   Procedure: LEFT HEART CATHETERIZATION WITH Beatrix Fetters;  Surgeon: Peter M Martinique, MD;  Location: [facility] CATH LAB;  Service: Cardiovascular;  Laterality: N/A;  . NASAL SINUS SURGERY     twice  . SQUAMOUS CELL CARCINOMA EXCISION    . TUBAL LIGATION      Current Outpatient Prescriptions  Medication Sig Dispense Refill  . acetaminophen (TYLENOL) 650 MG CR tablet Take 1,300 mg by mouth 2 (two) times daily.    Marland Kitchen albuterol (PROVENTIL HFA;VENTOLIN HFA) 108 (90 Base) MCG/ACT inhaler Inhale 2 puffs into the lungs every 6 (six) hours as needed for wheezing. 1 Inhaler 11  . azithromycin (ZITHROMAX) 250 MG tablet Take 2 tablets by mouth on day 1, followed by 1 tablet by mouth daily for 4 days. 6 tablet 0  . budesonide-formoterol (SYMBICORT) 160-4.5 MCG/ACT inhaler INHALE 2 PUFFS INTO THE LUNGS 2 (TWO) TIMES DAILY. 10.2 Inhaler 11  . Calcium Carbonate-Vitamin D (CALCIUM + D PO) Take 1 tablet by mouth 2 (two) times daily.    . clopidogrel (PLAVIX) 75 MG tablet Take 1 tablet (75 mg total) by mouth daily. 90 tablet 3  . diclofenac sodium (VOLTAREN) 1 % GEL Apply 2 g topically 3 (three) times daily as needed (pain).    Marland Kitchen  fluticasone (FLONASE) 50 MCG/ACT nasal spray Place 2 sprays into both nostrils daily. For congestion 16 g 0  . HYDROcodone-acetaminophen (NORCO) 10-325 MG tablet Take 1 tablet by mouth every 6 (six) hours as needed. Back pain 20 tablet 0  . isosorbide mononitrate (IMDUR) 60 MG 24 hr tablet Take 1 tablet (60 mg total) by mouth 2 (two) times daily. 180 tablet 3  . Krill Oil 1000 MG CAPS Take 1,000 mg by mouth daily.    . nitroGLYCERIN (NITROSTAT) 0.4 MG SL tablet Place 1 tablet (0.4 mg total) under the tongue every 5 (five) minutes as needed for chest pain. 25 tablet  2  . Polyethyl Glycol-Propyl Glycol (SYSTANE OP) Place 1 drop into both eyes daily as needed (dry eyes).    . potassium chloride (K-DUR) 10 MEQ tablet Take 1 tablet (10 mEq total) by mouth daily as needed. 90 tablet 3  . rosuvastatin (CRESTOR) 40 MG tablet Take 1 tablet (40 mg total) by mouth daily. 90 tablet 3  . sertraline (ZOLOFT) 50 MG tablet TAKE 1 TABLET EVERY DAY **NEED OFFICE VISIT 90 tablet 1  . Spacer/Aero-Holding Chambers DEVI 1 spacer to use prn with HFA device as directed 1 each 0  . tiotropium (SPIRIVA) 18 MCG inhalation capsule Place 1 capsule (18 mcg total) into inhaler and inhale daily. 30 capsule 11  . tobramycin (TOBREX) 0.3 % ophthalmic solution Place 2 drops into both eyes every 6 (six) hours. 5 mL 0  . metoprolol tartrate (LOPRESSOR) 25 MG tablet Take 0.5 tablets (12.5 mg total) by mouth 2 (two) times daily. 180 tablet 3   No current facility-administered medications for this visit.      Allergies:   Ciprofloxacin; Codeine; Erythromycin; Meperidine hcl; Penicillins; Shellfish allergy; and Tape   Social History:  The patient  reports that she quit smoking about 23 years ago. Her smoking use included Cigarettes. She has a 20.00 pack-year smoking history. She has never used smokeless tobacco. She reports that she drinks alcohol. She reports that she does not use drugs.   Family History:   family history includes Alcohol abuse in her brother; Aneurysm in her brother; Cancer in her father and sister; Cirrhosis in her maternal grandfather; Colon cancer (age of onset: 50) in her father; Diabetes in her maternal grandmother and mother; Heart attack in her brother and brother; Heart attack (age of onset: 18) in her mother; Heart disease in her brother, brother, and father; Hyperlipidemia in her daughter; Hypertension in her brother and father; Hypothyroidism in her sister; Lung cancer in her father and sister; Obesity in her daughter and son; Stroke in her maternal grandmother and  paternal grandmother.    Review of Systems: Review of Systems  Constitutional: Positive for malaise/fatigue.  Respiratory: Negative.   Cardiovascular: Positive for chest pain.  Gastrointestinal: Negative.   Musculoskeletal: Negative.   Neurological: Negative.   Psychiatric/Behavioral: Negative.   All other systems reviewed and are negative.    PHYSICAL EXAM: VS:  BP 120/72 (BP Location: Left Arm, Patient Position: Sitting, Cuff Size: Normal)   Pulse 63   Ht '5\' 2"'$  (1.575 m)   Wt 140 lb 8 oz (63.7 kg)   BMI 25.70 kg/m  , BMI Body mass index is 25.7 kg/m. GEN: Well nourished, well developed, in no acute distress  HEENT: normal  Neck: no JVD, carotid bruits, or masses Cardiac: RRR; no murmurs, rubs, or gallops,no edema  Respiratory:  clear to auscultation bilaterally, normal work of breathing GI: soft, nontender, nondistended, +  BS MS: no deformity or atrophy  Skin: warm and dry, no rash Neuro:  Strength and sensation are intact Psych: euthymic mood, full affect    Recent Labs: 10/23/2015: TSH 3.09 01/03/2016: BUN 12; Creatinine, Ser 0.88; Potassium 4.0; Sodium 139 03/07/2016: ALT 8 03/10/2016: Hemoglobin 13.1; Platelets 262.0    Lipid Panel Lab Results  Component Value Date   CHOL 253 (H) 03/07/2016   HDL 32 (L) 03/07/2016   LDLCALC 183 (H) 03/07/2016   TRIG 188 (H) 03/07/2016      Wt Readings from Last 3 Encounters:  06/17/16 140 lb 8 oz (63.7 kg)  05/22/16 141 lb 6.4 oz (64.1 kg)  04/15/16 143 lb 8 oz (65.1 kg)       ASSESSMENT AND PLAN:  Mixed hyperlipidemia -  History of medication noncompliance with her Crestor Strongly recommended she continue Crestor 40 mg daily  Essential hypertension - Recommended she stop HCTZ, stop losartan, Start metoprolol 12.5 mill grams twice a day, increase isosorbide up to 60 mg twice a day  Bilateral carotid artery stenosis -  40 to 59% disease on the right, <39% on the left  Coronary artery disease due to lipid  rich plaque - Plan: Basic Metabolic Panel (BMET), CBC with Differential/Platelet, INR/PT  Hx of CABG - Plan: Basic Metabolic Panel (BMET), CBC with Differential/Platelet, INR/PT Severe three-vessel disease, prior stenting August 2017 now with recurrent unstable angina symptoms requiring increasing frequency of nitroglycerin for symptom relief  PAD: Ultrasound lower extremity arterial in 2017 showing 30-49% bilateral nocturia arterial disease   Unstable angina ([place]) -  As on previous visit August 2017, now with recurrent unstable angina symptoms She does not want stress testing  She feels symptoms are concerning enough to warrant cardiac catheterization She would like this done in [place] discussion with her concerning other treatment options such as pharmacologic Myoview, medication management.. She prefers catheterization I have reviewed the risks, indications, and alternatives to cardiac catheterization, possible angioplasty, and stenting with the patient. Risks include but are not limited to bleeding, infection, vascular injury, stroke, myocardial infection, arrhythmia, kidney injury, radiation-related injury in the case of prolonged fluoroscopy use, emergency cardiac surgery, and death. The patient understands the risks of serious complication is 1-2 in 2563 with diagnostic cardiac cath and 1-2% or less with angioplasty/stenting.  She'll be scheduled for catheterization with Dr. Fletcher Anon Medication changes as above, chest x-ray and lab work has been ordered   Total encounter time more than 45 minutes  Greater than 50% was spent in counseling and coordination of care with the patient   Disposition:   F/U  1 month   Orders Placed This Encounter  Procedures  . Basic Metabolic Panel (BMET)  . CBC with Differential/Platelet  . INR/PT     Signed, Esmond Plants, M.D., Ph.D. 06/17/2016  [place], [place]

## 2016-06-18 LAB — CBC WITH DIFFERENTIAL/PLATELET
BASOS ABS: 0 10*3/uL (ref 0.0–0.2)
Basos: 0 %
EOS (ABSOLUTE): 0.2 10*3/uL (ref 0.0–0.4)
Eos: 3 %
Hematocrit: 40.6 % (ref 34.0–46.6)
Hemoglobin: 13.4 g/dL (ref 11.1–15.9)
IMMATURE GRANS (ABS): 0 10*3/uL (ref 0.0–0.1)
IMMATURE GRANULOCYTES: 0 %
LYMPHS: 28 %
Lymphocytes Absolute: 1.9 10*3/uL (ref 0.7–3.1)
MCH: 27.1 pg (ref 26.6–33.0)
MCHC: 33 g/dL (ref 31.5–35.7)
MCV: 82 fL (ref 79–97)
Monocytes Absolute: 0.4 10*3/uL (ref 0.1–0.9)
Monocytes: 6 %
NEUTROS PCT: 63 %
Neutrophils Absolute: 4.2 10*3/uL (ref 1.4–7.0)
PLATELETS: 174 10*3/uL (ref 150–379)
RBC: 4.94 x10E6/uL (ref 3.77–5.28)
RDW: 16 % — ABNORMAL HIGH (ref 12.3–15.4)
WBC: 6.7 10*3/uL (ref 3.4–10.8)

## 2016-06-18 LAB — BASIC METABOLIC PANEL
BUN/Creatinine Ratio: 19 (ref 12–28)
BUN: 15 mg/dL (ref 8–27)
CALCIUM: 9.5 mg/dL (ref 8.7–10.3)
CHLORIDE: 99 mmol/L (ref 96–106)
CO2: 27 mmol/L (ref 18–29)
Creatinine, Ser: 0.79 mg/dL (ref 0.57–1.00)
GFR calc Af Amer: 84 mL/min/{1.73_m2} (ref >59)
GFR calc non Af Amer: 72 mL/min/{1.73_m2} (ref >59)
Glucose: 92 mg/dL (ref 65–99)
POTASSIUM: 3.2 mmol/L — AB (ref 3.5–5.2)
Sodium: 141 mmol/L (ref 134–144)

## 2016-06-18 LAB — PROTIME-INR
INR: 1 (ref 0.8–1.2)
Prothrombin Time: 10.6 s (ref 9.1–12.0)

## 2016-06-19 ENCOUNTER — Other Ambulatory Visit: Payer: Self-pay | Admitting: Medical

## 2016-06-19 NOTE — Addendum Note (Signed)
Addended by: Kittie Plater on: 06/19/2016 07:48 AM   Modules accepted: Orders

## 2016-06-24 ENCOUNTER — Telehealth: Payer: Self-pay

## 2016-06-24 NOTE — Telephone Encounter (Signed)
Spoke w/ pt.  Reminded her of cardiac cath tomorrow in [facility] w/ Dr. Fletcher Anon. She is appreciative of the call and is planning on arriving as scheduled. Asked her to call back w/ any questions or concerns.

## 2016-06-25 ENCOUNTER — Encounter (HOSPITAL_COMMUNITY)
Admission: RE | Disposition: A | Discharge status: Home / Self Care | Payer: Self-pay | Source: Ambulatory Visit | Attending: Cardiovascular Disease

## 2016-06-25 ENCOUNTER — Encounter: Payer: Self-pay | Admitting: *Deleted

## 2016-06-25 ENCOUNTER — Ambulatory Visit (HOSPITAL_COMMUNITY)
Admission: RE | Admit: 2016-06-25 | Discharge: 2016-06-25 | Disposition: A | Discharge status: Home / Self Care | Payer: PPO | Source: Ambulatory Visit | Attending: Cardiovascular Disease | Admitting: Cardiovascular Disease

## 2016-06-25 DIAGNOSIS — Z86711 Personal history of pulmonary embolism: Secondary | ICD-10-CM | POA: Insufficient documentation

## 2016-06-25 DIAGNOSIS — I10 Essential (primary) hypertension: Secondary | ICD-10-CM | POA: Insufficient documentation

## 2016-06-25 DIAGNOSIS — Z7951 Long term (current) use of inhaled steroids: Secondary | ICD-10-CM | POA: Diagnosis not present

## 2016-06-25 DIAGNOSIS — G8929 Other chronic pain: Secondary | ICD-10-CM | POA: Diagnosis not present

## 2016-06-25 DIAGNOSIS — M5126 Other intervertebral disc displacement, lumbar region: Secondary | ICD-10-CM | POA: Diagnosis not present

## 2016-06-25 DIAGNOSIS — E782 Mixed hyperlipidemia: Secondary | ICD-10-CM | POA: Diagnosis not present

## 2016-06-25 DIAGNOSIS — I25710 Atherosclerosis of autologous vein coronary artery bypass graft(s) with unstable angina pectoris: Secondary | ICD-10-CM | POA: Diagnosis not present

## 2016-06-25 DIAGNOSIS — F329 Major depressive disorder, single episode, unspecified: Secondary | ICD-10-CM | POA: Insufficient documentation

## 2016-06-25 DIAGNOSIS — Z880 Allergy status to penicillin: Secondary | ICD-10-CM | POA: Diagnosis not present

## 2016-06-25 DIAGNOSIS — I25709 Atherosclerosis of coronary artery bypass graft(s), unspecified, with unspecified angina pectoris: Secondary | ICD-10-CM

## 2016-06-25 DIAGNOSIS — Z85828 Personal history of other malignant neoplasm of skin: Secondary | ICD-10-CM | POA: Insufficient documentation

## 2016-06-25 DIAGNOSIS — I6523 Occlusion and stenosis of bilateral carotid arteries: Secondary | ICD-10-CM | POA: Insufficient documentation

## 2016-06-25 DIAGNOSIS — Z885 Allergy status to narcotic agent status: Secondary | ICD-10-CM | POA: Insufficient documentation

## 2016-06-25 DIAGNOSIS — R001 Bradycardia, unspecified: Secondary | ICD-10-CM | POA: Insufficient documentation

## 2016-06-25 DIAGNOSIS — M4800 Spinal stenosis, site unspecified: Secondary | ICD-10-CM | POA: Diagnosis not present

## 2016-06-25 DIAGNOSIS — I714 Abdominal aortic aneurysm, without rupture: Secondary | ICD-10-CM | POA: Diagnosis not present

## 2016-06-25 DIAGNOSIS — M810 Age-related osteoporosis without current pathological fracture: Secondary | ICD-10-CM | POA: Diagnosis not present

## 2016-06-25 DIAGNOSIS — I739 Peripheral vascular disease, unspecified: Secondary | ICD-10-CM | POA: Insufficient documentation

## 2016-06-25 DIAGNOSIS — J449 Chronic obstructive pulmonary disease, unspecified: Secondary | ICD-10-CM | POA: Diagnosis not present

## 2016-06-25 DIAGNOSIS — Z7902 Long term (current) use of antithrombotics/antiplatelets: Secondary | ICD-10-CM | POA: Diagnosis not present

## 2016-06-25 DIAGNOSIS — Z91013 Allergy to seafood: Secondary | ICD-10-CM | POA: Insufficient documentation

## 2016-06-25 DIAGNOSIS — Z87891 Personal history of nicotine dependence: Secondary | ICD-10-CM | POA: Insufficient documentation

## 2016-06-25 DIAGNOSIS — I252 Old myocardial infarction: Secondary | ICD-10-CM | POA: Diagnosis not present

## 2016-06-25 DIAGNOSIS — I2583 Coronary atherosclerosis due to lipid rich plaque: Secondary | ICD-10-CM | POA: Insufficient documentation

## 2016-06-25 DIAGNOSIS — Z006 Encounter for examination for normal comparison and control in clinical research program: Secondary | ICD-10-CM

## 2016-06-25 DIAGNOSIS — K219 Gastro-esophageal reflux disease without esophagitis: Secondary | ICD-10-CM | POA: Diagnosis not present

## 2016-06-25 DIAGNOSIS — I2584 Coronary atherosclerosis due to calcified coronary lesion: Secondary | ICD-10-CM | POA: Insufficient documentation

## 2016-06-25 DIAGNOSIS — I2582 Chronic total occlusion of coronary artery: Secondary | ICD-10-CM | POA: Insufficient documentation

## 2016-06-25 HISTORY — PX: CARDIAC CATHETERIZATION: SHX172

## 2016-06-25 LAB — BASIC METABOLIC PANEL
ANION GAP: 8 (ref 5–15)
BUN: 11 mg/dL (ref 6–20)
CALCIUM: 8.8 mg/dL — AB (ref 8.9–10.3)
CO2: 28 mmol/L (ref 22–32)
Chloride: 107 mmol/L (ref 101–111)
Creatinine, Ser: 0.84 mg/dL (ref 0.44–1.00)
GFR calc non Af Amer: >60 mL/min (ref >60)
Glucose, Bld: 94 mg/dL (ref 65–99)
Potassium: 4.1 mmol/L (ref 3.5–5.1)
Sodium: 143 mmol/L (ref 135–145)

## 2016-06-25 SURGERY — LEFT HEART CATH AND CORS/GRAFTS ANGIOGRAPHY

## 2016-06-25 MED ORDER — MIDAZOLAM HCL 2 MG/2ML IJ SOLN
INTRAMUSCULAR | Status: AC
  Filled 2016-06-25: qty 2

## 2016-06-25 MED ORDER — FENTANYL CITRATE (PF) 100 MCG/2ML IJ SOLN
INTRAMUSCULAR | Status: DC | PRN
  Administered 2016-06-25 (×2): 25 ug via INTRAVENOUS

## 2016-06-25 MED ORDER — ASPIRIN 81 MG PO CHEW
81.0000 mg | CHEWABLE_TABLET | ORAL | Status: AC
  Administered 2016-06-25: 81 mg via ORAL

## 2016-06-25 MED ORDER — HEPARIN (PORCINE) IN NACL 2-0.9 UNIT/ML-% IJ SOLN
INTRAMUSCULAR | Status: AC
Start: 2016-06-25 — End: 2016-06-25
  Filled 2016-06-25: qty 1000

## 2016-06-25 MED ORDER — HEPARIN (PORCINE) IN NACL 2-0.9 UNIT/ML-% IJ SOLN
INTRAMUSCULAR | Status: DC | PRN
  Administered 2016-06-25: 1000 mL

## 2016-06-25 MED ORDER — SODIUM CHLORIDE 0.9% FLUSH: 3.0000 mL | Freq: Two times a day (BID) | INTRAVENOUS | Status: DC

## 2016-06-25 MED ORDER — LIDOCAINE HCL (PF) 1 % IJ SOLN
INTRAMUSCULAR | Status: AC
  Filled 2016-06-25: qty 30

## 2016-06-25 MED ORDER — SODIUM CHLORIDE 0.9 % IV SOLN: INTRAVENOUS | Status: AC

## 2016-06-25 MED ORDER — IOPAMIDOL (ISOVUE-370) INJECTION 76%
INTRAVENOUS | Status: AC
Start: 2016-06-25 — End: 2016-06-25
  Filled 2016-06-25: qty 125

## 2016-06-25 MED ORDER — SODIUM CHLORIDE 0.9 % WEIGHT BASED INFUSION: 1.0000 mL/kg/h | INTRAVENOUS | Status: DC

## 2016-06-25 MED ORDER — LIDOCAINE HCL (PF) 1 % IJ SOLN
INTRAMUSCULAR | Status: DC | PRN
  Administered 2016-06-25: 2 mL

## 2016-06-25 MED ORDER — SODIUM CHLORIDE 0.9% FLUSH: 3.0000 mL | INTRAVENOUS | Status: DC | PRN

## 2016-06-25 MED ORDER — ASPIRIN 81 MG PO CHEW
CHEWABLE_TABLET | ORAL | Status: AC
  Filled 2016-06-25: qty 1

## 2016-06-25 MED ORDER — SODIUM CHLORIDE 0.9 % IV SOLN: 250.0000 mL | INTRAVENOUS | Status: DC | PRN

## 2016-06-25 MED ORDER — VERAPAMIL HCL 2.5 MG/ML IV SOLN
INTRAVENOUS | Status: AC
  Filled 2016-06-25: qty 2

## 2016-06-25 MED ORDER — IOPAMIDOL (ISOVUE-370) INJECTION 76%
INTRAVENOUS | Status: DC | PRN
  Administered 2016-06-25: 125 mL via INTRA_ARTERIAL

## 2016-06-25 MED ORDER — SODIUM CHLORIDE 0.9 % WEIGHT BASED INFUSION
3.0000 mL/kg/h | INTRAVENOUS | Status: AC
  Administered 2016-06-25: 3 mL/kg/h via INTRAVENOUS

## 2016-06-25 MED ORDER — HEPARIN SODIUM (PORCINE) 1000 UNIT/ML IJ SOLN
INTRAMUSCULAR | Status: AC
  Filled 2016-06-25: qty 1

## 2016-06-25 MED ORDER — FENTANYL CITRATE (PF) 100 MCG/2ML IJ SOLN
INTRAMUSCULAR | Status: AC
  Filled 2016-06-25: qty 2

## 2016-06-25 MED ORDER — HEPARIN SODIUM (PORCINE) 1000 UNIT/ML IJ SOLN
INTRAMUSCULAR | Status: DC | PRN
  Administered 2016-06-25: 4000 [IU] via INTRAVENOUS

## 2016-06-25 MED ORDER — VERAPAMIL HCL 2.5 MG/ML IV SOLN
INTRAVENOUS | Status: DC | PRN
  Administered 2016-06-25: 10 mL via INTRA_ARTERIAL

## 2016-06-25 MED ORDER — MIDAZOLAM HCL 2 MG/2ML IJ SOLN
INTRAMUSCULAR | Status: DC | PRN
  Administered 2016-06-25 (×2): 1 mg via INTRAVENOUS

## 2016-06-25 SURGICAL SUPPLY — 12 items
CATH EXPO 5FR ANG PIGTAIL 145 (CATHETERS) ×2 IMPLANT
CATH OPTITORQUE TIG 4.0 5F (CATHETERS) ×2 IMPLANT
DEVICE RAD COMP TR BAND LRG (VASCULAR PRODUCTS) ×2 IMPLANT
GLIDESHEATH SLEND SS 6F .021 (SHEATH) ×2 IMPLANT
GUIDEWIRE INQWIRE 1.5J.035X260 (WIRE) ×1 IMPLANT
INQWIRE 1.5J .035X260CM (WIRE) ×2
KIT HEART LEFT (KITS) ×2 IMPLANT
PACK CARDIAC CATHETERIZATION (CUSTOM PROCEDURE TRAY) ×2 IMPLANT
SYR MEDRAD MARK V 150ML (SYRINGE) ×2 IMPLANT
TRANSDUCER W/STOPCOCK (MISCELLANEOUS) ×2 IMPLANT
TUBING CIL FLEX 10 FLL-RA (TUBING) ×2 IMPLANT
WIRE HI TORQ VERSACORE-J 145CM (WIRE) ×2 IMPLANT

## 2016-06-25 NOTE — Interval H&P Note (Signed)
Cath Lab Visit (complete for each Cath Lab visit)  Clinical Evaluation Leading to the Procedure:   ACS: No.  Non-ACS:    Anginal Classification: CCS III  Anti-ischemic medical therapy: Maximal Therapy (2 or more classes of medications)  Non-Invasive Test Results: No non-invasive testing performed  Prior CABG: Previous CABG      History and Physical Interval Note:  06/25/2016 10:21 AM  Andrea [name]  has presented today for surgery, with the diagnosis of chest pain  The various methods of treatment have been discussed with the patient and family. After consideration of risks, benefits and other options for treatment, the patient has consented to  Procedure(s): Left Heart Cath and Cors/Grafts Angiography (N/A) as a surgical intervention .  The patient's history has been reviewed, patient examined, no change in status, stable for surgery.  I have reviewed the patient's chart and labs.  Questions were answered to the patient's satisfaction.     Andrea [name]

## 2016-06-25 NOTE — H&P (View-Only) (Signed)
Cardiology Office Note  Date:  06/17/2016   ID:  Andrea [name], DOB Sep 22, 1940, MRN [id number]  PCP:  [name], MD   Chief Complaint  Patient presents with  . other    57mof/u. Pt c/o of chest pain, sob, high BP. Reviewed meds with pt verbally.    HPI:  Ms. KMauriellois a pleasant 76year old woman with history of chronic bradycardia, spinal stenosis, peripheral vascular disease, status post CEA on the left , 40-59% residual disease on the right, hyperlipidemia,  coronary artery disease and bypass surgery in 1999, stent placement to distal RCA may 2013, In August 2017 she had 2 stents placed to vein graft to the RCA, also with stents to the mid left circumflex with resolution of her anginal symptoms She presents for routine followup Of her coronary artery disease . She has underlying COPD, long [facility] at [facility] [place] [facility] [place] [facility] [place] She is part of the bio freedom study  In follow-up today, she reports that she is not doing well Having worsening unstable angina symptoms over the past 2 months She started writing down symptoms starting mid-December, they have been getting progressively worse Cold triggers chest pain, Walking triggers pain,  Taking NTG sometimes once a day, twice a day This is been a bad week for her, taking one or 2 nitroglycerin daily Also feels exhausted, this is a new symptom for her She reports symptoms are similar to previous anginal symptoms in August 2017  EKG on today's visit shows normal sinus rhythm with rate 63 bpm, diffuse T-wave abnormality through the anterior precordial leads, inferior leads, worsening T-wave abnormality when compared to prior EKGs  Other past medical history reviewed cardiac catheterization 01/02/2016 She had 2 stents placed to the vein graft to the RCA, also angioplasty with stent placement to the mid left circumflex Following intervention she feels much  better, no chest pain, shortness of breath improved Previously was taking nitroglycerin for anginal symptoms, now no longer needing nitroglycerin Was taking NTG 5 x in one day  She has a history of stents to her lower extremities, these have not been visualized in many years  Cath 01/02/16 Significant underlying three-vessel coronary artery disease. Patent LIMA to LAD. However, there is retrograde flow in the graft itself likely due to no obstructive disease down the native LAD. The native LAD has diffuse 60-70% calcified disease in the proximal and midsegment. Severe new disease in SVG to RCA. Collaterals were noted from left-to-right. Worsening disease in the native left circumflex which does not have a patent bypass.  2. Moderate left subclavian stenosis which was not significant by gradients. 3. Normal left ventricular end-diastolic pressure. Left ventricular angiography was not performed. 4. Successful angioplasty and drug-eluting stent placement to the SVG to RCA with 2 bio freedom stent placement. Successful angioplasty and stent placement to the mid left circumflex with 1 I freedom stent.  The SVG to RCA is an old graft with new areas of disease. I think this graft is high risk for closure in spite of intervention today. The patient was enrolled in the bio freedom study and thus dual antiplatelet therapy is optional after one month. However, I recommend continuing Plavix indefinitely. The patient did have significant chest pain during SVG to RCA PCI with ST changes. By the end of the procedure, her chest pain resolved completely. However, mild ST elevation persisted possibly due to microvascular injury.  catheterization November 2012 Atretic LIMA to the LAD,  She has saphenous vein graft sequential to PDA and PL loss of distal limb of vein graft to the PDA and PL with left-to-right collaterals 40-50% proximal to mid LAD with focal 60-70% mid LAD 2 large OM's, 30% mid OM1, 40-50%  ostial Ejection fraction 55% with inferobasal akinesis   previously stopped her Crestor, cholesterol from 144 up to 242 in May 30. 2017  PMH:   has a past medical history of AAA (abdominal aortic aneurysm) ([place] (01/2009); Acute bronchitis (03/20/2013; 2017); Angina; Anosmia; Asthma; Baker's cyst of knee (01/30/2014); Basal cell carcinoma; Bradycardia; CAD (coronary artery disease); Cerebrovascular disease (01/2009); Chronic thoracic back pain; COPD (chronic obstructive pulmonary disease) ([place]); Depression; Dizziness; Dysrhythmia; GERD (gastroesophageal reflux disease); Heart murmur; Herniated lumbar disc without myelopathy; History of blood transfusion (1999); Hyperglycemia (10/23/2015); Hyperlipidemia; Hypertension; Medicare annual wellness visit, subsequent (07/31/2013); NSTEMI (non-ST elevated myocardial infarction) ([place]) (11/12); Osteoarthritis of back; Osteoporosis; Pneumonia ("several times"); Pulmonary embolism ([place]) (1999); PVD (peripheral vascular disease) ([place]); Shortness of breath; and Squamous carcinoma.  PSH:    Past Surgical History:  Procedure Laterality Date  . ABDOMINAL AORTIC ANEURYSM REPAIR     pt denies this hx on 01/02/2016  . BASAL CELL CARCINOMA EXCISION    . CARDIAC CATHETERIZATION N/A 01/02/2016   Procedure: Left Heart Cath and Coronary Angiography;  Surgeon: Wellington Hampshire, MD;  Location: [facility];  Service: Cardiovascular;  Laterality: N/A;  . CARDIAC CATHETERIZATION  1996; 1999  . CAROTID ENDARTERECTOMY Left 01/02/2011  . CATARACT EXTRACTION W/ INTRAOCULAR LENS  IMPLANT, BILATERAL    . CLOSED REDUCTION NASAL FRACTURE  11/2007  . CORONARY ANGIOPLASTY WITH STENT PLACEMENT  10/10/2011   drug eluting  to rc & saphenous  . CORONARY ARTERY BYPASS GRAFT  1999   "CABG X3"  . DILATION AND CURETTAGE OF UTERUS    . FEMORAL ARTERY STENT Bilateral   . FRACTURE SURGERY    . LEFT HEART CATHETERIZATION WITH CORONARY/GRAFT ANGIOGRAM N/A 04/22/2011   Procedure: LEFT HEART  CATHETERIZATION WITH Beatrix Fetters;  Surgeon: Jolaine Artist, MD;  Location: [facility] CATH LAB;  Service: Cardiovascular;  Laterality: N/A;  . LEFT HEART CATHETERIZATION WITH CORONARY/GRAFT ANGIOGRAM N/A 10/10/2011   Procedure: LEFT HEART CATHETERIZATION WITH Beatrix Fetters;  Surgeon: Peter M Martinique, MD;  Location: [facility] CATH LAB;  Service: Cardiovascular;  Laterality: N/A;  . NASAL SINUS SURGERY     twice  . SQUAMOUS CELL CARCINOMA EXCISION    . TUBAL LIGATION      Current Outpatient Prescriptions  Medication Sig Dispense Refill  . acetaminophen (TYLENOL) 650 MG CR tablet Take 1,300 mg by mouth 2 (two) times daily.    Marland Kitchen albuterol (PROVENTIL HFA;VENTOLIN HFA) 108 (90 Base) MCG/ACT inhaler Inhale 2 puffs into the lungs every 6 (six) hours as needed for wheezing. 1 Inhaler 11  . azithromycin (ZITHROMAX) 250 MG tablet Take 2 tablets by mouth on day 1, followed by 1 tablet by mouth daily for 4 days. 6 tablet 0  . budesonide-formoterol (SYMBICORT) 160-4.5 MCG/ACT inhaler INHALE 2 PUFFS INTO THE LUNGS 2 (TWO) TIMES DAILY. 10.2 Inhaler 11  . Calcium Carbonate-Vitamin D (CALCIUM + D PO) Take 1 tablet by mouth 2 (two) times daily.    . clopidogrel (PLAVIX) 75 MG tablet Take 1 tablet (75 mg total) by mouth daily. 90 tablet 3  . diclofenac sodium (VOLTAREN) 1 % GEL Apply 2 g topically 3 (three) times daily as needed (pain).    Marland Kitchen  fluticasone (FLONASE) 50 MCG/ACT nasal spray Place 2 sprays into both nostrils daily. For congestion 16 g 0  . HYDROcodone-acetaminophen (NORCO) 10-325 MG tablet Take 1 tablet by mouth every 6 (six) hours as needed. Back pain 20 tablet 0  . isosorbide mononitrate (IMDUR) 60 MG 24 hr tablet Take 1 tablet (60 mg total) by mouth 2 (two) times daily. 180 tablet 3  . Krill Oil 1000 MG CAPS Take 1,000 mg by mouth daily.    . nitroGLYCERIN (NITROSTAT) 0.4 MG SL tablet Place 1 tablet (0.4 mg total) under the tongue every 5 (five) minutes as needed for chest pain. 25 tablet  2  . Polyethyl Glycol-Propyl Glycol (SYSTANE OP) Place 1 drop into both eyes daily as needed (dry eyes).    . potassium chloride (K-DUR) 10 MEQ tablet Take 1 tablet (10 mEq total) by mouth daily as needed. 90 tablet 3  . rosuvastatin (CRESTOR) 40 MG tablet Take 1 tablet (40 mg total) by mouth daily. 90 tablet 3  . sertraline (ZOLOFT) 50 MG tablet TAKE 1 TABLET EVERY DAY **NEED OFFICE VISIT 90 tablet 1  . Spacer/Aero-Holding Chambers DEVI 1 spacer to use prn with HFA device as directed 1 each 0  . tiotropium (SPIRIVA) 18 MCG inhalation capsule Place 1 capsule (18 mcg total) into inhaler and inhale daily. 30 capsule 11  . tobramycin (TOBREX) 0.3 % ophthalmic solution Place 2 drops into both eyes every 6 (six) hours. 5 mL 0  . metoprolol tartrate (LOPRESSOR) 25 MG tablet Take 0.5 tablets (12.5 mg total) by mouth 2 (two) times daily. 180 tablet 3   No current facility-administered medications for this visit.      Allergies:   Ciprofloxacin; Codeine; Erythromycin; Meperidine hcl; Penicillins; Shellfish allergy; and Tape   Social History:  The patient  reports that she quit smoking about 23 years ago. Her smoking use included Cigarettes. She has a 20.00 pack-year smoking history. She has never used smokeless tobacco. She reports that she drinks alcohol. She reports that she does not use drugs.   Family History:   family history includes Alcohol abuse in her brother; Aneurysm in her brother; Cancer in her father and sister; Cirrhosis in her maternal grandfather; Colon cancer (age of onset: 41) in her father; Diabetes in her maternal grandmother and mother; Heart attack in her brother and brother; Heart attack (age of onset: 17) in her mother; Heart disease in her brother, brother, and father; Hyperlipidemia in her daughter; Hypertension in her brother and father; Hypothyroidism in her sister; Lung cancer in her father and sister; Obesity in her daughter and son; Stroke in her maternal grandmother and  paternal grandmother.    Review of Systems: Review of Systems  Constitutional: Positive for malaise/fatigue.  Respiratory: Negative.   Cardiovascular: Positive for chest pain.  Gastrointestinal: Negative.   Musculoskeletal: Negative.   Neurological: Negative.   Psychiatric/Behavioral: Negative.   All other systems reviewed and are negative.    PHYSICAL EXAM: VS:  BP 120/72 (BP Location: Left Arm, Patient Position: Sitting, Cuff Size: Normal)   Pulse 63   Ht '5\' 2"'$  (1.575 m)   Wt 140 lb 8 oz (63.7 kg)   BMI 25.70 kg/m  , BMI Body mass index is 25.7 kg/m. GEN: Well nourished, well developed, in no acute distress  HEENT: normal  Neck: no JVD, carotid bruits, or masses Cardiac: RRR; no murmurs, rubs, or gallops,no edema  Respiratory:  clear to auscultation bilaterally, normal work of breathing GI: soft, nontender, nondistended, +  BS MS: no deformity or atrophy  Skin: warm and dry, no rash Neuro:  Strength and sensation are intact Psych: euthymic mood, full affect    Recent Labs: 10/23/2015: TSH 3.09 01/03/2016: BUN 12; Creatinine, Ser 0.88; Potassium 4.0; Sodium 139 03/07/2016: ALT 8 03/10/2016: Hemoglobin 13.1; Platelets 262.0    Lipid Panel Lab Results  Component Value Date   CHOL 253 (H) 03/07/2016   HDL 32 (L) 03/07/2016   LDLCALC 183 (H) 03/07/2016   TRIG 188 (H) 03/07/2016      Wt Readings from Last 3 Encounters:  06/17/16 140 lb 8 oz (63.7 kg)  05/22/16 141 lb 6.4 oz (64.1 kg)  04/15/16 143 lb 8 oz (65.1 kg)       ASSESSMENT AND PLAN:  Mixed hyperlipidemia -  History of medication noncompliance with her Crestor Strongly recommended she continue Crestor 40 mg daily  Essential hypertension - Recommended she stop HCTZ, stop losartan, Start metoprolol 12.5 mill grams twice a day, increase isosorbide up to 60 mg twice a day  Bilateral carotid artery stenosis -  40 to 59% disease on the right, <39% on the left  Coronary artery disease due to lipid  rich plaque - Plan: Basic Metabolic Panel (BMET), CBC with Differential/Platelet, INR/PT  Hx of CABG - Plan: Basic Metabolic Panel (BMET), CBC with Differential/Platelet, INR/PT Severe three-vessel disease, prior stenting August 2017 now with recurrent unstable angina symptoms requiring increasing frequency of nitroglycerin for symptom relief  PAD: Ultrasound lower extremity arterial in 2017 showing 30-49% bilateral nocturia arterial disease   Unstable angina ([place]) -  As on previous visit August 2017, now with recurrent unstable angina symptoms She does not want stress testing  She feels symptoms are concerning enough to warrant cardiac catheterization She would like this done in [place] discussion with her concerning other treatment options such as pharmacologic Myoview, medication management.. She prefers catheterization I have reviewed the risks, indications, and alternatives to cardiac catheterization, possible angioplasty, and stenting with the patient. Risks include but are not limited to bleeding, infection, vascular injury, stroke, myocardial infection, arrhythmia, kidney injury, radiation-related injury in the case of prolonged fluoroscopy use, emergency cardiac surgery, and death. The patient understands the risks of serious complication is 1-2 in 6213 with diagnostic cardiac cath and 1-2% or less with angioplasty/stenting.  She'll be scheduled for catheterization with Dr. Fletcher Anon Medication changes as above, chest x-ray and lab work has been ordered   Total encounter time more than 45 minutes  Greater than 50% was spent in counseling and coordination of care with the patient   Disposition:   F/U  1 month   Orders Placed This Encounter  Procedures  . Basic Metabolic Panel (BMET)  . CBC with Differential/Platelet  . INR/PT     Signed, Esmond Plants, M.D., Ph.D. 06/17/2016  [place], [place]

## 2016-06-25 NOTE — Discharge Instructions (Signed)

## 2016-06-26 ENCOUNTER — Encounter (HOSPITAL_COMMUNITY): Payer: Self-pay | Admitting: Cardiovascular Disease

## 2016-07-02 ENCOUNTER — Telehealth: Payer: Self-pay | Admitting: *Deleted

## 2016-07-02 NOTE — Telephone Encounter (Signed)
LM for patient to return call to schedule AWV.   

## 2016-07-02 NOTE — Progress Notes (Signed)
[employer] Research study 6 month visit completed while patient was here for cardiac cath. (see source)

## 2016-07-03 ENCOUNTER — Encounter: Payer: Self-pay | Admitting: Family Medicine

## 2016-07-03 ENCOUNTER — Ambulatory Visit (INDEPENDENT_AMBULATORY_CARE_PROVIDER_SITE_OTHER): Payer: PPO | Admitting: Family Medicine

## 2016-07-03 VITALS — BP 126/78 | HR 48 | Temp 97.8°F | Wt 144.6 lb

## 2016-07-03 DIAGNOSIS — R739 Hyperglycemia, unspecified: Secondary | ICD-10-CM | POA: Diagnosis not present

## 2016-07-03 DIAGNOSIS — I10 Essential (primary) hypertension: Secondary | ICD-10-CM

## 2016-07-03 DIAGNOSIS — R252 Cramp and spasm: Secondary | ICD-10-CM | POA: Diagnosis not present

## 2016-07-03 DIAGNOSIS — I059 Rheumatic mitral valve disease, unspecified: Secondary | ICD-10-CM

## 2016-07-03 DIAGNOSIS — R109 Unspecified abdominal pain: Secondary | ICD-10-CM | POA: Diagnosis not present

## 2016-07-03 DIAGNOSIS — E782 Mixed hyperlipidemia: Secondary | ICD-10-CM | POA: Diagnosis not present

## 2016-07-03 DIAGNOSIS — R7989 Other specified abnormal findings of blood chemistry: Secondary | ICD-10-CM | POA: Diagnosis not present

## 2016-07-03 DIAGNOSIS — R5383 Other fatigue: Secondary | ICD-10-CM

## 2016-07-03 DIAGNOSIS — E559 Vitamin D deficiency, unspecified: Secondary | ICD-10-CM | POA: Diagnosis not present

## 2016-07-03 LAB — COMPREHENSIVE METABOLIC PANEL
ALBUMIN: 3.9 g/dL (ref 3.5–5.2)
ALK PHOS: 56 U/L (ref 39–117)
ALT: 13 U/L (ref 0–35)
AST: 13 U/L (ref 0–37)
BILIRUBIN TOTAL: 0.4 mg/dL (ref 0.2–1.2)
BUN: 9 mg/dL (ref 6–23)
CO2: 33 mEq/L — ABNORMAL HIGH (ref 19–32)
CREATININE: 0.84 mg/dL (ref 0.40–1.20)
Calcium: 9.2 mg/dL (ref 8.4–10.5)
Chloride: 107 mEq/L (ref 96–112)
GFR: 69.82 mL/min (ref >60.00)
Glucose, Bld: 77 mg/dL (ref 70–99)
Potassium: 3.9 mEq/L (ref 3.5–5.1)
Sodium: 145 mEq/L (ref 135–145)
TOTAL PROTEIN: 6.5 g/dL (ref 6.0–8.3)

## 2016-07-03 LAB — MAGNESIUM: MAGNESIUM: 1.8 mg/dL (ref 1.5–2.5)

## 2016-07-03 LAB — CBC
HCT: 37.4 % (ref 36.0–46.0)
Hemoglobin: 12.2 g/dL (ref 12.0–15.0)
MCHC: 32.8 g/dL (ref 30.0–36.0)
MCV: 83.5 fl (ref 78.0–100.0)
PLATELETS: 151 10*3/uL (ref 150.0–400.0)
RBC: 4.47 Mil/uL (ref 3.87–5.11)
RDW: 16.1 % — ABNORMAL HIGH (ref 11.5–15.5)
WBC: 7 10*3/uL (ref 4.0–10.5)

## 2016-07-03 LAB — LIPID PANEL
CHOLESTEROL: 126 mg/dL (ref 0–200)
HDL: 30.6 mg/dL — ABNORMAL LOW (ref >39.00)
NonHDL: 95.37
TRIGLYCERIDES: 209 mg/dL — AB (ref 0.0–149.0)
Total CHOL/HDL Ratio: 4
VLDL: 41.8 mg/dL — ABNORMAL HIGH (ref 0.0–40.0)

## 2016-07-03 LAB — URINALYSIS
Bilirubin Urine: NEGATIVE
Ketones, ur: NEGATIVE
LEUKOCYTES UA: NEGATIVE
Nitrite: NEGATIVE
Total Protein, Urine: NEGATIVE
UROBILINOGEN UA: 0.2 (ref 0.0–1.0)
Urine Glucose: NEGATIVE
pH: 6 (ref 5.0–8.0)

## 2016-07-03 LAB — LDL CHOLESTEROL, DIRECT: LDL DIRECT: 64 mg/dL

## 2016-07-03 NOTE — Assessment & Plan Note (Signed)
Tolerating statin, encouraged heart healthy diet, avoid trans fats, minimize simple carbs and saturated fats. Increase exercise as tolerated 

## 2016-07-03 NOTE — Progress Notes (Signed)
[name], female   DOB: 02-28-1941, 76 y.o.   MRN: [id number]   Subjective:    [name], female    DOB: 11/28/1940, 76 y.o.   MRN: [id number]  Chief Complaint  Patient presents with  . Follow-up  . Hypertension  . Hyperlipidemia  . Fatigue    Hypertension  This is a chronic problem. The current episode started more than 1 year ago. The problem has been gradually improving since onset. Associated symptoms include malaise/fatigue and shortness of breath. Pertinent negatives include no blurred vision, chest pain, headaches or palpitations. The current treatment provides mild improvement.  Hyperlipidemia  This is a chronic problem. The current episode started more than 1 year ago. The problem is controlled. Associated symptoms include shortness of breath. Pertinent negatives include no chest pain.    Patient is in today for a 6 month follow up on hypertension, hyperlipidemia and other chronic medical concerns. Patient presents today with "exhaustting" and SOB. Patient states all she wants to do is sleep all day. With review it turns out she has been diagnosed with influenza and then likely norovirus in past month and has just not felt great since then. Is not consistently sob it comes and goes but the exhaustion is persistent. Denies CP/palp/HA/congestion/fevers/GI or GU c/o. Taking meds as prescribed. She had to use her albuterol twice this am but that has been unusual it is helpful when she needs it.  Past Medical History:  Diagnosis Date  . AAA (abdominal aortic aneurysm) ([place]) 01/2009   AAA (2.8 x 3.0)  moderate RAS (left); 2.7 x 2.7 cm (07/11/05)  . Acute bronchitis 03/20/2013; 2017  . Angina   . Anosmia   . Asthma   . Baker's cyst of knee 01/30/2014  . Basal cell carcinoma    "back and left arm"  . Bradycardia    Metoprolol stopped 08/2011  . CAD (coronary artery disease)    s/CABG (reports IMA and 2 SVGs) back in 1999  Myoview normal 3/10; s/p PCI  with DES to PL branch of distal RCA 09/2011; PCI +DES to SVG-RCA, PCI + DES to mid LCx 12/2015  . Cerebrovascular disease 01/2009   carotid u/s  R 0-39%   L 60-79%  . Chronic thoracic back pain   . COPD (chronic obstructive pulmonary disease) ([place])   . Depression   . Dizziness   . Dysrhythmia    hx of sinus brady  . GERD (gastroesophageal reflux disease)   . Heart murmur   . Herniated lumbar disc without myelopathy   . History of blood transfusion 1999   "when I had the bypass; had a PE"  . Hyperglycemia 10/23/2015  . Hyperlipidemia   . Hypertension   . Medicare annual wellness visit, subsequent 07/31/2013  . NSTEMI (non-ST elevated myocardial infarction) ([place]) 11/12   Cath showed atretic IMA graft to the LAD, SVG to PD was patent but the continuation of this graft to the PL branch was occluded; there were L to R collaterals; Mid LAD had a 60 to 70% stenosis. She has been treated medically.  Neg Myoview 05/2011  . Osteoarthritis of back   . Osteoporosis   . Pneumonia "several times"  . Pulmonary embolism ([place]) 1999   "after my bypass"  . PVD (peripheral vascular disease) ([place])   . Shortness of breath   . Squamous carcinoma    "nose"    Past Surgical History:  Procedure Laterality Date  . ABDOMINAL AORTIC ANEURYSM REPAIR  pt denies this hx on 01/02/2016  . BASAL CELL CARCINOMA EXCISION    . CARDIAC CATHETERIZATION N/A 01/02/2016   Procedure: Left Heart Cath and Coronary Angiography;  Surgeon: Wellington Hampshire, MD;  Location: [facility];  Service: Cardiovascular;  Laterality: N/A;  . CARDIAC CATHETERIZATION  1996; 1999  . CARDIAC CATHETERIZATION N/A 06/25/2016   Procedure: Left Heart Cath and Cors/Grafts Angiography;  Surgeon: Wellington Hampshire, MD;  Location: [facility];  Service: Cardiovascular;  Laterality: N/A;  . CAROTID ENDARTERECTOMY Left 01/02/2011  . CATARACT EXTRACTION W/ INTRAOCULAR LENS  IMPLANT, BILATERAL    . CLOSED REDUCTION NASAL FRACTURE  11/2007  .  CORONARY ANGIOPLASTY WITH STENT PLACEMENT  10/10/2011   drug eluting  to rc & saphenous  . CORONARY ARTERY BYPASS GRAFT  1999   "CABG X3"  . DILATION AND CURETTAGE OF UTERUS    . FEMORAL ARTERY STENT Bilateral   . FRACTURE SURGERY    . LEFT HEART CATHETERIZATION WITH CORONARY/GRAFT ANGIOGRAM N/A 04/22/2011   Procedure: LEFT HEART CATHETERIZATION WITH Beatrix Fetters;  Surgeon: Jolaine Artist, MD;  Location: [facility];  Service: Cardiovascular;  Laterality: N/A;  . LEFT HEART CATHETERIZATION WITH CORONARY/GRAFT ANGIOGRAM N/A 10/10/2011   Procedure: LEFT HEART CATHETERIZATION WITH Beatrix Fetters;  Surgeon: Peter M Martinique, MD;  Location: [facility] CATH LAB;  Service: Cardiovascular;  Laterality: N/A;  . NASAL SINUS SURGERY     twice  . SQUAMOUS CELL CARCINOMA EXCISION    . TUBAL LIGATION      Family History  Problem Relation Age of Onset  . Heart attack Mother 73  . Diabetes Mother   . Colon cancer Father 90  . Lung cancer Father     smoked  . Heart disease Father     angina  . Hypertension Father   . Cancer Father     colon, lung  . Lung cancer Sister     smoked  . Cancer Sister   . Hypothyroidism Sister   . Hypertension Brother   . Heart attack Brother   . Heart disease Brother     stent  . Heart attack Brother     CABG  . Heart disease Brother     CABG with 1 bypass  . Aneurysm Brother     brain  . Alcohol abuse Brother   . Hyperlipidemia Daughter   . Diabetes Maternal Grandmother   . Stroke Maternal Grandmother   . Cirrhosis Maternal Grandfather   . Stroke Paternal Grandmother   . Obesity Daughter   . Obesity Son     Social History   Social History  . Marital status: Married    Spouse name: N/A  . Number of children: 5  . Years of education: N/A   Occupational History  . Retired Retired    former  Marine scientist   Social History Main Topics  . Smoking status: Former Smoker    Packs/day: 0.50    Years: 40.00    Types: Cigarettes    Quit  date: 01/19/1993  . Smokeless tobacco: Never Used  . Alcohol use Yes     Comment: 01/02/2016 "might drink a glass of wine socially q 3-4 months"  . Drug use: No  . Sexual activity: Not Currently    Birth control/ protection: Post-menopausal   Other Topics Concern  . Not on file   Social History Narrative   Married with 5 children    Outpatient Medications Prior to Visit  Medication Sig  Dispense Refill  . acetaminophen (TYLENOL) 650 MG CR tablet Take 1,300 mg by mouth 2 (two) times daily.    Marland Kitchen albuterol (PROVENTIL HFA;VENTOLIN HFA) 108 (90 Base) MCG/ACT inhaler Inhale 2 puffs into the lungs every 6 (six) hours as needed for wheezing. 1 Inhaler 11  . budesonide-formoterol (SYMBICORT) 160-4.5 MCG/ACT inhaler INHALE 2 PUFFS INTO THE LUNGS 2 (TWO) TIMES DAILY. 10.2 Inhaler 11  . Calcium Carbonate-Vitamin D (CALCIUM + D PO) Take 1 tablet by mouth 2 (two) times daily.    . clopidogrel (PLAVIX) 75 MG tablet Take 1 tablet (75 mg total) by mouth daily. 90 tablet 3  . fluticasone (FLONASE) 50 MCG/ACT nasal spray PLACE 2 SPRAYS INTO BOTH NOSTRILS DAILY. FOR CONGESTION 16 g 1  . HYDROcodone-acetaminophen (NORCO) 10-325 MG tablet Take 1 tablet by mouth every 6 (six) hours as needed. Back pain 20 tablet 0  . isosorbide mononitrate (IMDUR) 60 MG 24 hr tablet Take 1 tablet (60 mg total) by mouth 2 (two) times daily. 180 tablet 3  . Krill Oil 1000 MG CAPS Take 1,000 mg by mouth daily.    . metoprolol tartrate (LOPRESSOR) 25 MG tablet Take 0.5 tablets (12.5 mg total) by mouth 2 (two) times daily. 180 tablet 3  . nitroGLYCERIN (NITROSTAT) 0.4 MG SL tablet Place 1 tablet (0.4 mg total) under the tongue every 5 (five) minutes as needed for chest pain. 25 tablet 2  . Polyethyl Glycol-Propyl Glycol (SYSTANE OP) Place 1 drop into both eyes 3 (three) times daily as needed (dry eyes).     . potassium chloride (K-DUR) 10 MEQ tablet Take 1 tablet (10 mEq total) by mouth daily as needed. (Patient taking differently:  Take 10-20 mEq by mouth daily as needed (for cramping/lab results.). ) 90 tablet 3  . rosuvastatin (CRESTOR) 40 MG tablet Take 1 tablet (40 mg total) by mouth daily. 90 tablet 3  . sertraline (ZOLOFT) 50 MG tablet TAKE 1 TABLET EVERY DAY **NEED OFFICE VISIT 90 tablet 1  . Spacer/Aero-Holding Chambers DEVI 1 spacer to use prn with HFA device as directed 1 each 0  . tiotropium (SPIRIVA) 18 MCG inhalation capsule Place 1 capsule (18 mcg total) into inhaler and inhale daily. 30 capsule 11   No facility-administered medications prior to visit.     Allergies  Allergen Reactions  . Codeine Nausea And Vomiting    REACTION: nausea/vomiting  . Erythromycin Other (See Comments)    REACTION: tongue burns  . Meperidine Hcl Nausea And Vomiting    REACTION: Nausea/vomiting  . Shellfish Allergy Nausea And Vomiting  . Ciprofloxacin Rash    REACTION: rash IV  . Penicillins Rash    Has patient had a PCN reaction causing immediate rash, facial/tongue/throat swelling, SOB or lightheadedness with hypotension:unsure Has patient had a PCN reaction causing severe rash involving mucus membranes or skin necrosis:No Has patient had a PCN reaction that required hospitalization:No Has patient had a PCN reaction occurring within the last 10 years:No If all of the above answers are "NO", then may proceed with Cephalosporin use.   . Tape Rash    Review of Systems  Constitutional: Positive for malaise/fatigue. Negative for fever.  HENT: Negative for congestion.   Eyes: Negative for blurred vision.  Respiratory: Positive for cough, shortness of breath and wheezing.   Cardiovascular: Negative for chest pain, palpitations and leg swelling.  Gastrointestinal: Negative for vomiting.  Genitourinary: Positive for flank pain. Negative for dysuria and urgency.  Musculoskeletal: Negative for back pain.  Skin: Negative  for rash.  Neurological: Negative for loss of consciousness and headaches.       Objective:      Physical Exam  Constitutional: She is oriented to person, place, and time. She appears well-developed and well-nourished. No distress.  HENT:  Head: Normocephalic and atraumatic.  Eyes: Conjunctivae are normal.  Neck: Normal range of motion. No thyromegaly present.  Cardiovascular: Normal rate and regular rhythm.   Pulmonary/Chest: Effort normal and breath sounds normal. She has no wheezes.  Abdominal: Soft. Bowel sounds are normal. There is no tenderness.  Musculoskeletal: Normal range of motion. She exhibits no edema or deformity.  Neurological: She is alert and oriented to person, place, and time.  Skin: Skin is warm and dry. She is not diaphoretic.  Psychiatric: She has a normal mood and affect.    BP 126/78   Pulse (!) 48   Temp 97.8 F (36.6 C) (Oral)   Wt 144 lb 9.6 oz (65.6 kg)   SpO2 95%   BMI 26.45 kg/m  Wt Readings from Last 3 Encounters:  07/03/16 144 lb 9.6 oz (65.6 kg)  06/25/16 140 lb (63.5 kg)  06/17/16 140 lb 8 oz (63.7 kg)     Lab Results  Component Value Date   WBC 6.7 06/17/2016   HGB 13.1 03/10/2016   HCT 40.6 06/17/2016   PLT 174 06/17/2016   GLUCOSE 94 06/25/2016   CHOL 253 (H) 03/07/2016   TRIG 188 (H) 03/07/2016   HDL 32 (L) 03/07/2016   LDLDIRECT 83.0 02/08/2015   LDLCALC 183 (H) 03/07/2016   ALT 8 03/07/2016   AST 13 03/07/2016   NA 143 06/25/2016   K 4.1 06/25/2016   CL 107 06/25/2016   CREATININE 0.84 06/25/2016   BUN 11 06/25/2016   CO2 28 06/25/2016   TSH 3.09 10/23/2015   INR 1.0 06/17/2016   HGBA1C 6.3 10/23/2015    Lab Results  Component Value Date   TSH 3.09 10/23/2015   Lab Results  Component Value Date   WBC 6.7 06/17/2016   HGB 13.1 03/10/2016   HCT 40.6 06/17/2016   MCV 82 06/17/2016   PLT 174 06/17/2016   Lab Results  Component Value Date   NA 143 06/25/2016   K 4.1 06/25/2016   CO2 28 06/25/2016   GLUCOSE 94 06/25/2016   BUN 11 06/25/2016   CREATININE 0.84 06/25/2016   BILITOT 0.4 03/07/2016    ALKPHOS 77 03/07/2016   AST 13 03/07/2016   ALT 8 03/07/2016   PROT 6.3 03/07/2016   ALBUMIN 3.7 03/07/2016   CALCIUM 8.8 (L) 06/25/2016   ANIONGAP 8 06/25/2016   GFR 64.60 10/23/2015   Lab Results  Component Value Date   CHOL 253 (H) 03/07/2016   Lab Results  Component Value Date   HDL 32 (L) 03/07/2016   Lab Results  Component Value Date   LDLCALC 183 (H) 03/07/2016   Lab Results  Component Value Date   TRIG 188 (H) 03/07/2016   Lab Results  Component Value Date   CHOLHDL 7.9 (H) 03/07/2016   Lab Results  Component Value Date   HGBA1C 6.3 10/23/2015       Assessment & Plan:   Problem List Items Addressed This Visit    Hyperlipidemia    Tolerating statin, encouraged heart healthy diet, avoid trans fats, minimize simple carbs and saturated fats. Increase exercise as tolerated      Relevant Orders   Lipid panel   Essential hypertension - Primary  Well controlled, no changes to meds. Encouraged heart healthy diet such as the DASH diet and exercise as tolerated.       Relevant Orders   CBC   Comprehensive metabolic panel   TSH   Fatigue    Multifactorial, has had influenza and likely norovirus as well in the past month. She also had a cardiac cath last week when one of her stents collapsed. She also noted increased wheezing this week with increased Albuterol use. She will call with worsening symptoms, increase rest and hydration and we will check out labs and echo.       Hyperglycemia    hgba1c acceptable, minimize simple carbs. Increase exercise as tolerated.       Relevant Orders   T4, free   Cramps, extremity   Relevant Orders   Magnesium   Mitral valve disorder    Repeat Echo due to increased fatigue      Relevant Orders   ECHOCARDIOGRAM COMPLETE   Flank pain    UA and culture with sensitivity.      Relevant Orders   Urinalysis   Urine culture    Other Visit Diagnoses    Vitamin D deficiency       Relevant Orders   VITAMIN D 25  Hydroxy (Vit-D Deficiency, Fractures)      I am having Ms. Cavanah maintain her Calcium Carbonate-Vitamin D (CALCIUM + D PO), acetaminophen, Polyethyl Glycol-Propyl Glycol (SYSTANE OP), Krill Oil, potassium chloride, Spacer/Aero-Holding Chambers, clopidogrel, nitroGLYCERIN, albuterol, tiotropium, budesonide-formoterol, sertraline, HYDROcodone-acetaminophen, rosuvastatin, isosorbide mononitrate, metoprolol tartrate, and fluticasone.  No orders of the defined types were placed in this encounter.   CMA served as Education administrator during this visit. History, Physical and Plan performed by medical provider. Documentation and orders reviewed and attested to.  [name], MD

## 2016-07-03 NOTE — Progress Notes (Signed)
Pre visit review using our clinic review tool, if applicable. No additional management support is needed unless otherwise documented below in the visit note. 

## 2016-07-03 NOTE — Assessment & Plan Note (Signed)
Repeat Echo due to increased fatigue

## 2016-07-03 NOTE — Patient Instructions (Signed)
Fatigue Introduction Fatigue is feeling tired all of the time, a lack of energy, or a lack of motivation. Occasional or mild fatigue is often a normal response to activity or life in general. However, long-lasting (chronic) or extreme fatigue may indicate an underlying medical condition. Follow these instructions at home: Watch your fatigue for any changes. The following actions may help to lessen any discomfort you are feeling:  Talk to your health care provider about how much sleep you need each night. Try to get the required amount every night.  Take medicines only as directed by your health care provider.  Eat a healthy and nutritious diet. Ask your health care provider if you need help changing your diet.  Drink enough fluid to keep your urine clear or pale yellow.  Practice ways of relaxing, such as yoga, meditation, massage therapy, or acupuncture.  Exercise regularly.  Change situations that cause you stress. Try to keep your work and personal routine reasonable.  Do not abuse illegal drugs.  Limit alcohol intake to no more than 1 drink per day for nonpregnant women and 2 drinks per day for men. One drink equals 12 ounces of beer, 5 ounces of wine, or 1 ounces of hard liquor.  Take a multivitamin, if directed by your health care provider. Contact a health care provider if:  Your fatigue does not get better.  You have a fever.  You have unintentional weight loss or gain.  You have headaches.  You have difficulty:  Falling asleep.  Sleeping throughout the night.  You feel angry, guilty, anxious, or sad.  You are unable to have a bowel movement (constipation).  You skin is dry.  Your legs or another part of your body is swollen. Get help right away if:  You feel confused.  Your vision is blurry.  You feel faint or pass out.  You have a severe headache.  You have severe abdominal, pelvic, or back pain.  You have chest pain, shortness of breath, or an  irregular or fast heartbeat.  You are unable to urinate or you urinate less than normal.  You develop abnormal bleeding, such as bleeding from the rectum, vagina, nose, lungs, or nipples.  You vomit blood.  You have thoughts about harming yourself or committing suicide.  You are worried that you might harm someone else. This information is not intended to replace advice given to you by your health care provider. Make sure you discuss any questions you have with your health care provider. Document Released: 03/09/2007 Document Revised: 10/18/2015 Document Reviewed: 09/13/2013  2017 Elsevier  

## 2016-07-03 NOTE — Assessment & Plan Note (Signed)
Well controlled, no changes to meds. Encouraged heart healthy diet such as the DASH diet and exercise as tolerated.  °

## 2016-07-03 NOTE — Assessment & Plan Note (Signed)
hgba1c acceptable, minimize simple carbs. Increase exercise as tolerated.  

## 2016-07-03 NOTE — Assessment & Plan Note (Signed)
Multifactorial, has had influenza and likely norovirus as well in the past month. She also had a cardiac cath last week when one of her stents collapsed. She also noted increased wheezing this week with increased Albuterol use. She will call with worsening symptoms, increase rest and hydration and we will check out labs and echo.

## 2016-07-03 NOTE — Assessment & Plan Note (Signed)
Encouraged to get adequate exercise, calcium and vitamin d intake. Takes a calcium/Vitamin D combo bid and Vitamin D 1000 IU daily

## 2016-07-03 NOTE — Assessment & Plan Note (Signed)
UA and culture with sensitivity.

## 2016-07-04 LAB — T4, FREE: FREE T4: 0.73 ng/dL (ref 0.60–1.60)

## 2016-07-04 LAB — URINE CULTURE: ORGANISM ID, BACTERIA: NO GROWTH

## 2016-07-04 LAB — TSH: TSH: 7.01 u[IU]/mL — ABNORMAL HIGH (ref 0.35–4.50)

## 2016-07-04 LAB — VITAMIN D 25 HYDROXY (VIT D DEFICIENCY, FRACTURES): VITD: 20.31 ng/mL — ABNORMAL LOW (ref 30.00–100.00)

## 2016-07-07 ENCOUNTER — Other Ambulatory Visit: Payer: Self-pay | Admitting: Family Medicine

## 2016-07-07 DIAGNOSIS — E0590 Thyrotoxicosis, unspecified without thyrotoxic crisis or storm: Secondary | ICD-10-CM

## 2016-07-07 MED ORDER — LEVOTHYROXINE SODIUM 25 MCG PO TABS: 25.0000 ug | ORAL_TABLET | Freq: Every day | ORAL | 3 refills | Status: DC

## 2016-07-07 MED ORDER — VITAMIN D (ERGOCALCIFEROL) 1.25 MG (50000 UNIT) PO CAPS: 50000.0000 [IU] | ORAL_CAPSULE | ORAL | 4 refills | Status: DC

## 2016-07-16 ENCOUNTER — Encounter: Payer: Self-pay | Admitting: Family Medicine

## 2016-07-17 ENCOUNTER — Ambulatory Visit (INDEPENDENT_AMBULATORY_CARE_PROVIDER_SITE_OTHER): Payer: PPO | Admitting: Cardiovascular Disease

## 2016-07-17 ENCOUNTER — Encounter: Payer: Self-pay | Admitting: Cardiovascular Disease

## 2016-07-17 VITALS — BP 140/82 | HR 52 | Ht 62.0 in | Wt 140.0 lb

## 2016-07-17 DIAGNOSIS — R5383 Other fatigue: Secondary | ICD-10-CM

## 2016-07-17 DIAGNOSIS — I25709 Atherosclerosis of coronary artery bypass graft(s), unspecified, with unspecified angina pectoris: Secondary | ICD-10-CM

## 2016-07-17 DIAGNOSIS — I6523 Occlusion and stenosis of bilateral carotid arteries: Secondary | ICD-10-CM

## 2016-07-17 DIAGNOSIS — I10 Essential (primary) hypertension: Secondary | ICD-10-CM

## 2016-07-17 DIAGNOSIS — E782 Mixed hyperlipidemia: Secondary | ICD-10-CM

## 2016-07-17 DIAGNOSIS — Z9889 Other specified postprocedural states: Secondary | ICD-10-CM | POA: Diagnosis not present

## 2016-07-17 DIAGNOSIS — I340 Nonrheumatic mitral (valve) insufficiency: Secondary | ICD-10-CM | POA: Diagnosis not present

## 2016-07-17 NOTE — Progress Notes (Signed)
Cardiology Office Note  Date:  07/17/2016   ID:  Andrea [name], DOB July 03, 1940, MRN [id number]  PCP:  [name], MD   Chief Complaint  Patient presents with  . other    1 mo f/u post left heart cath and cors grafts angiography. Pt c/o fatigue, occasional angina.  Meds reviewed verbally with patient     HPI:  Andrea [name] is a pleasant 76 year old woman with history of chronic bradycardia, spinal stenosis, peripheral vascular disease, status post CEA on the left , 40-59% residual disease on the right, hyperlipidemia, coronary artery disease and bypass surgery in 1999, stent placement to distal RCA may 2013, In August 2017 she had 2 stents placed to vein graft to the RCA, also with stents to the mid left circumflex with resolution of her anginal symptoms She presents for routine followup Of her coronary artery disease . She has underlying COPD, long smoking history for at least 30 years CABG at [facility] in [place] Prior history of stents to her lower extremities She is part of the bio freedom study  She presents today after recent cardiac catheterization Details below, this showed occluded vein graft to the RCA Medical management recommended Also with 60% left ostial subclavian vessel disease  She reports significant fatigue, etiology unclear Low vit D, she is being treated aggressively Started on thyroid medication for TSH greater than 7 Still has significant fatigue, napping in the daytime No regular exercise program  Previous studies reviewed today Carotid 10/17 40 to 59% disease on the right, <39% on the left  Denies any significant anginal symptoms We discussed her bradycardia with her, she does not think this is a problem  On her last clinic visit she was having worsening angina pain She is taking frequent nitroglycerin sublingual pills Scheduled for cardiac catheterization  06/25/2016  1. Significant underlying three-vessel coronary artery disease with patent LIMA  to LAD. The SVG to the RCA is now occluded proximally with left-to-right collaterals. The left circumflex stent is patent with no significant restenosis. 2. Mildly reduced LV systolic function with an EF of 50-55% and inferior wall hypokinesis. Mildly elevated left ventricular end-diastolic pressure. 3. Aortic arch angiogram was done to evaluate the stenosis in the left subclavian artery. It showed 60% heavily calcified stenosis in the ostium of the left subclavian artery. There was about 23-55 mm systolic gradient across the stenosis.  EKG on today's visit shows normal sinus rhythm with rate 52 bpm, diffuse T-wave abnormality precordial leads, inferior leads  Other past medical history reviewed cardiac catheterization 01/02/2016 She had 2 stents placed to the vein graft to the RCA, also angioplasty with stent placement to the mid left circumflex Following intervention she feels much better, no chest pain, shortness of breath improved Previously was taking nitroglycerin for anginal symptoms, now no longer needing nitroglycerin Was taking NTG 5 x in one day  She has a history of stents to her lower extremities, these have not been visualized in many years  Cath 01/02/16 Significant underlying three-vessel coronary artery disease. Patent LIMA to LAD. However, there is retrograde flow in the graft itself likely due to no obstructive disease down the native LAD. The native LAD has diffuse 60-70% calcified disease in the proximal and midsegment. Severe new disease in SVG to RCA. Collaterals were noted from left-to-right. Worsening disease in the native left circumflex which does not have a patent bypass.  2. Moderate left subclavian stenosis which was not significant by gradients. 3. Normal left ventricular end-diastolic pressure.  Left ventricular angiography was not performed. 4. Successful angioplasty and drug-eluting stent placement to the SVG to RCA with 2 bio freedom stent placement.  Successful angioplasty and stent placement to the mid left circumflex with 1 I freedom stent.  The SVG to RCA is an old graft with new areas of disease. I think this graft is high risk for closure in spite of intervention today. The patient was enrolled in the bio freedom study and thus dual antiplatelet therapy is optional after one month. However, I recommend continuing Plavix indefinitely. The patient did have significant chest pain during SVG to RCA PCI with ST changes. By the end of the procedure, her chest pain resolved completely. However, mild ST elevation persisted possibly due to microvascular injury.    catheterization November 2012 Atretic LIMA to the LAD,  She has saphenous vein graft sequential to PDA and PL loss of distal limb of vein graft to the PDA and PL with left-to-right collaterals 40-50% proximal to mid LAD with focal 60-70% mid LAD 2 large OM's, 30% mid OM1, 40-50% ostial Ejection fraction 55% with inferobasal akinesis  previously stopped her Crestor, cholesterol from 144 up to 242 in May 30. 2017  PMH:   has a past medical history of AAA (abdominal aortic aneurysm) ([place]) (01/2009); Acute bronchitis (03/20/2013; 2017); Angina; Anosmia; Asthma; Baker's cyst of knee (01/30/2014); Basal cell carcinoma; Bradycardia; CAD (coronary artery disease); Cerebrovascular disease (01/2009); Chronic thoracic back pain; COPD (chronic obstructive pulmonary disease) ([place]); Depression; Dizziness; Dysrhythmia; GERD (gastroesophageal reflux disease); Heart murmur; Herniated lumbar disc without myelopathy; History of blood transfusion (1999); Hyperglycemia (10/23/2015); Hyperlipidemia; Hypertension; Medicare annual wellness visit, subsequent (07/31/2013); NSTEMI (non-ST elevated myocardial infarction) ([place]) (11/12); Osteoarthritis of back; Osteoporosis; Pneumonia ("several times"); Pulmonary embolism ([place]) (1999); PVD (peripheral vascular disease) ([place]); Shortness of breath; and Squamous  carcinoma.  PSH:    Past Surgical History:  Procedure Laterality Date  . ABDOMINAL AORTIC ANEURYSM REPAIR     pt denies this hx on 01/02/2016  . BASAL CELL CARCINOMA EXCISION    . CARDIAC CATHETERIZATION N/A 01/02/2016   Procedure: Left Heart Cath and Coronary Angiography;  Surgeon: Wellington Hampshire, MD;  Location: [facility];  Service: Cardiovascular;  Laterality: N/A;  . CARDIAC CATHETERIZATION  1996; 1999  . CARDIAC CATHETERIZATION N/A 06/25/2016   Procedure: Left Heart Cath and Cors/Grafts Angiography;  Surgeon: Wellington Hampshire, MD;  Location: [facility];  Service: Cardiovascular;  Laterality: N/A;  . CAROTID ENDARTERECTOMY Left 01/02/2011  . CATARACT EXTRACTION W/ INTRAOCULAR LENS  IMPLANT, BILATERAL    . CLOSED REDUCTION NASAL FRACTURE  11/2007  . CORONARY ANGIOPLASTY WITH STENT PLACEMENT  10/10/2011   drug eluting  to rc & saphenous  . CORONARY ARTERY BYPASS GRAFT  1999   "CABG X3"  . DILATION AND CURETTAGE OF UTERUS    . FEMORAL ARTERY STENT Bilateral   . FRACTURE SURGERY    . LEFT HEART CATHETERIZATION WITH CORONARY/GRAFT ANGIOGRAM N/A 04/22/2011   Procedure: LEFT HEART CATHETERIZATION WITH Beatrix Fetters;  Surgeon: Jolaine Artist, MD;  Location: [facility];  Service: Cardiovascular;  Laterality: N/A;  . LEFT HEART CATHETERIZATION WITH CORONARY/GRAFT ANGIOGRAM N/A 10/10/2011   Procedure: LEFT HEART CATHETERIZATION WITH Beatrix Fetters;  Surgeon: Peter M Martinique, MD;  Location: [facility] C[facility] LAB;  Service: Cardiovascular;  Laterality: N/A;  . NASAL SINUS SURGERY     twice  . SQUAMOUS CELL CARCINOMA EXCISION    . TUBAL LIGATION      Current  Outpatient Prescriptions  Medication Sig Dispense Refill  . acetaminophen (TYLENOL) 650 MG CR tablet Take 1,300 mg by mouth 2 (two) times daily.    Marland Kitchen albuterol (PROVENTIL HFA;VENTOLIN HFA) 108 (90 Base) MCG/ACT inhaler Inhale 2 puffs into the lungs every 6 (six) hours as needed for wheezing. 1 Inhaler 11  .  budesonide-formoterol (SYMBICORT) 160-4.5 MCG/ACT inhaler INHALE 2 PUFFS INTO THE LUNGS 2 (TWO) TIMES DAILY. 10.2 Inhaler 11  . Calcium Carbonate-Vitamin D (CALCIUM + D PO) Take 1 tablet by mouth 2 (two) times daily.    . clopidogrel (PLAVIX) 75 MG tablet Take 1 tablet (75 mg total) by mouth daily. 90 tablet 3  . fluticasone (FLONASE) 50 MCG/ACT nasal spray PLACE 2 SPRAYS INTO BOTH NOSTRILS DAILY. FOR CONGESTION 16 g 1  . HYDROcodone-acetaminophen (NORCO) 10-325 MG tablet Take 1 tablet by mouth every 6 (six) hours as needed. Back pain 20 tablet 0  . isosorbide mononitrate (IMDUR) 60 MG 24 hr tablet Take 1 tablet (60 mg total) by mouth 2 (two) times daily. 180 tablet 3  . Krill Oil 1000 MG CAPS Take 1,000 mg by mouth daily.    Marland Kitchen levothyroxine (SYNTHROID, LEVOTHROID) 25 MCG tablet Take 1 tablet (25 mcg total) by mouth daily before breakfast. 30 tablet 3  . metoprolol tartrate (LOPRESSOR) 25 MG tablet Take 0.5 tablets (12.5 mg total) by mouth 2 (two) times daily. 180 tablet 3  . nitroGLYCERIN (NITROSTAT) 0.4 MG SL tablet Place 1 tablet (0.4 mg total) under the tongue every 5 (five) minutes as needed for chest pain. 25 tablet 2  . Polyethyl Glycol-Propyl Glycol (SYSTANE OP) Place 1 drop into both eyes 3 (three) times daily as needed (dry eyes).     . potassium chloride (K-DUR) 10 MEQ tablet Take 1 tablet (10 mEq total) by mouth daily as needed. (Patient taking differently: Take 10-20 mEq by mouth daily as needed (for cramping/lab results.). ) 90 tablet 3  . rosuvastatin (CRESTOR) 40 MG tablet Take 1 tablet (40 mg total) by mouth daily. 90 tablet 3  . sertraline (ZOLOFT) 50 MG tablet TAKE 1 TABLET EVERY DAY **NEED OFFICE VISIT 90 tablet 1  . Spacer/Aero-Holding Chambers DEVI 1 spacer to use prn with HFA device as directed 1 each 0  . tiotropium (SPIRIVA) 18 MCG inhalation capsule Place 1 capsule (18 mcg total) into inhaler and inhale daily. 30 capsule 11  . Vitamin D, Ergocalciferol, (DRISDOL) 50000 units  CAPS capsule Take 1 capsule (50,000 Units total) by mouth every 7 (seven) days. 4 capsule 4   No current facility-administered medications for this visit.      Allergies:   Codeine; Erythromycin; Meperidine hcl; Shellfish allergy; Ciprofloxacin; Penicillins; and Tape   Social History:  The patient  reports that she quit smoking about 23 years ago. Her smoking use included Cigarettes. She has a 20.00 pack-year smoking history. She has never used smokeless tobacco. She reports that she drinks alcohol. She reports that she does not use drugs.   Family History:   family history includes Alcohol abuse in her brother; Aneurysm in her brother; Cancer in her father and sister; Cirrhosis in her maternal grandfather; Colon cancer (age of onset: 59) in her father; Diabetes in her maternal grandmother and mother; Heart attack in her brother and brother; Heart attack (age of onset: 28) in her mother; Heart disease in her brother, brother, and father; Hyperlipidemia in her daughter; Hypertension in her brother and father; Hypothyroidism in her sister; Lung cancer in her father and  sister; Obesity in her daughter and son; Stroke in her maternal grandmother and paternal grandmother.    Review of Systems: Review of Systems  Constitutional: Positive for malaise/fatigue.  Respiratory: Negative.   Cardiovascular: Negative.   Gastrointestinal: Negative.   Musculoskeletal: Negative.   Neurological: Negative.   Psychiatric/Behavioral: Negative.   All other systems reviewed and are negative.    PHYSICAL EXAM: VS:  BP 140/82 (BP Location: Left Arm, Patient Position: Sitting, Cuff Size: Normal)   Pulse (!) 52   Ht '5\' 2"'$  (1.575 m)   Wt 140 lb (63.5 kg)   BMI 25.61 kg/m  , BMI Body mass index is 25.61 kg/m. GEN: Well nourished, well developed, in no acute distress  HEENT: normal  Neck: no JVD, 1+ carotid bruit on the right, no masses Cardiac: RRR; 2/6 SEM RSB no rubs, or gallops,no edema  Respiratory:   clear to auscultation bilaterally, normal work of breathing GI: soft, nontender, nondistended, + BS MS: no deformity or atrophy  Skin: warm and dry, no rash Neuro:  Strength and sensation are intact Psych: euthymic mood, full affect    Recent Labs: 07/03/2016: ALT 13; BUN 9; Creatinine, Ser 0.84; Hemoglobin 12.2; Magnesium 1.8; Platelets 151.0; Potassium 3.9; Sodium 145; TSH 7.01    Lipid Panel Lab Results  Component Value Date   CHOL 126 07/03/2016   HDL 30.60 (L) 07/03/2016   LDLCALC 183 (H) 03/07/2016   TRIG 209.0 (H) 07/03/2016      Wt Readings from Last 3 Encounters:  07/17/16 140 lb (63.5 kg)  07/03/16 144 lb 9.6 oz (65.6 kg)  06/25/16 140 lb (63.5 kg)       ASSESSMENT AND PLAN:  Mixed hyperlipidemia - Plan: EKG 12-Lead Cholesterol is at goal on the current lipid regimen. No changes to the medications were made.]  Essential hypertension - Plan: EKG 12-Lead Blood pressure is well controlled on today's visit. No changes made to the medications.  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 EKG 12-Lead     Signed, Esmond Plants, M.D.,  Ph.D. 07/17/2016  [place], [place]

## 2016-07-17 NOTE — Patient Instructions (Addendum)
Medication Instructions:   No medication changes made  Labwork:  B12 today  Testing/Procedures:  Echocardiogram for mitral valve regurgitation, CAD, CABG Echocardiography is a painless test that uses sound waves to create images of your heart. It provides your doctor with information about the size and shape of your heart and how well your heart's chambers and valves are working. This procedure takes approximately one hour. There are no restrictions for this procedure.   I recommend watching educational videos on topics of interest to you at:       [email or web address]  Enter code: [email or web address]    Follow-Up: It was a pleasure seeing you in the office today. Please call us if you have new issues that need to be addressed before your next appt.  [phone]  Your physician wants you to follow-up in: 6 months.  You will receive a reminder letter in the mail two months in advance. If you don't receive a letter, please call our office to schedule the follow-up appointment.  If you need a refill on your cardiac medications before your next appointment, please call your pharmacy.    Echocardiogram An echocardiogram, or echocardiography, uses sound waves (ultrasound) to produce an image of your heart. The echocardiogram is simple, painless, obtained within a short period of time, and offers valuable information to your health care provider. The images from an echocardiogram can provide information such as:  Evidence of coronary artery disease (CAD).  Heart size.  Heart muscle function.  Heart valve function.  Aneurysm detection.  Evidence of a past heart attack.  Fluid buildup around the heart.  Heart muscle thickening.  Assess heart valve function. Tell a health care provider about:  Any allergies you have.  All medicines you are taking, including vitamins, herbs, eye drops, creams, and over-the-counter medicines.  Any problems you or family members have had with anesthetic  medicines.  Any blood disorders you have.  Any surgeries you have had.  Any medical conditions you have.  Whether you are pregnant or may be pregnant. What happens before the procedure? No special preparation is needed. Eat and drink normally. What happens during the procedure?  In order to produce an image of your heart, gel will be applied to your chest and a wand-like tool (transducer) will be moved over your chest. The gel will help transmit the sound waves from the transducer. The sound waves will harmlessly bounce off your heart to allow the heart images to be captured in real-time motion. These images will then be recorded.  You may need an IV to receive a medicine that improves the quality of the pictures. What happens after the procedure? You may return to your normal schedule including diet, activities, and medicines, unless your health care provider tells you otherwise. This information is not intended to replace advice given to you by your health care provider. Make sure you discuss any questions you have with your health care provider. Document Released: 05/09/2000 Document Revised: 12/29/2015 Document Reviewed: 01/17/2013 Elsevier Interactive Patient Education  2017 [employer].

## 2016-07-18 LAB — VITAMIN B12: Vitamin B-12: 441 pg/mL (ref 232–1245)

## 2016-07-21 ENCOUNTER — Other Ambulatory Visit: Payer: Self-pay | Admitting: Cardiovascular Disease

## 2016-07-21 NOTE — Telephone Encounter (Signed)
Will you please advise if okay to refill. I am not seeing it on the med list. Thank you!

## 2016-07-30 ENCOUNTER — Other Ambulatory Visit (HOSPITAL_BASED_OUTPATIENT_CLINIC_OR_DEPARTMENT_OTHER): Payer: PPO

## 2016-08-05 DIAGNOSIS — M2669 Other specified disorders of temporomandibular joint: Secondary | ICD-10-CM | POA: Diagnosis not present

## 2016-08-05 DIAGNOSIS — Z974 Presence of external hearing-aid: Secondary | ICD-10-CM | POA: Diagnosis not present

## 2016-08-05 DIAGNOSIS — H9113 Presbycusis, bilateral: Secondary | ICD-10-CM | POA: Insufficient documentation

## 2016-08-05 DIAGNOSIS — H6121 Impacted cerumen, right ear: Secondary | ICD-10-CM | POA: Diagnosis not present

## 2016-08-05 DIAGNOSIS — H9203 Otalgia, bilateral: Secondary | ICD-10-CM | POA: Diagnosis not present

## 2016-08-05 DIAGNOSIS — D333 Benign neoplasm of cranial nerves: Secondary | ICD-10-CM | POA: Insufficient documentation

## 2016-08-05 DIAGNOSIS — M26629 Arthralgia of temporomandibular joint, unspecified side: Secondary | ICD-10-CM | POA: Insufficient documentation

## 2016-08-06 ENCOUNTER — Telehealth: Payer: Self-pay | Admitting: Cardiovascular Disease

## 2016-08-06 NOTE — Telephone Encounter (Signed)
lmov to rs echo schedule overbooked.

## 2016-08-07 NOTE — Telephone Encounter (Signed)
LMOV to rs echo schedule overbooked.

## 2016-08-08 ENCOUNTER — Other Ambulatory Visit: Payer: Self-pay | Admitting: Family Medicine

## 2016-08-08 MED ORDER — FLUTICASONE PROPIONATE 50 MCG/ACT NA SUSP: 2.0000 | Freq: Every day | NASAL | 3 refills | Status: DC

## 2016-08-08 MED ORDER — LEVOTHYROXINE SODIUM 25 MCG PO TABS: 25.0000 ug | ORAL_TABLET | Freq: Every day | ORAL | 3 refills | Status: DC

## 2016-08-11 ENCOUNTER — Ambulatory Visit (INDEPENDENT_AMBULATORY_CARE_PROVIDER_SITE_OTHER): Payer: PPO

## 2016-08-11 ENCOUNTER — Other Ambulatory Visit: Payer: Self-pay

## 2016-08-11 DIAGNOSIS — I059 Rheumatic mitral valve disease, unspecified: Secondary | ICD-10-CM | POA: Diagnosis not present

## 2016-08-11 LAB — ECHOCARDIOGRAM COMPLETE
CHL CUP MV DEC (S): 298
CHL CUP PV REG GRAD DIAS: 12 mmHg
CHL CUP REG VEL DIAS: 171 cm/s
E decel time: 298 msec
EERAT: 9.35
FS: 43 % (ref 28–44)
IV/PV OW: 1
LA diam index: 2.74 cm/m2
LA vol index: 40.1 mL/m2
LA vol: 65.8 mL
LASIZE: 45 mm
LAVOLA4C: 61.2 mL
LDCA: 2.27 cm2
LEFT ATRIUM END SYS DIAM: 45 mm
LV E/e'average: 9.35
LV TDI E'LATERAL: 8.41
LV sys vol index: 15 mL/m2
LVDIAVOL: 59 mL (ref 46–106)
LVDIAVOLIN: 36 mL/m2
LVEEMED: 9.35
LVELAT: 8.41 cm/s
LVOT VTI: 24.1 cm
LVOT diameter: 17 mm
LVOTSV: 55 mL
LVSYSVOL: 24 mL (ref 14–42)
MV Peak grad: 2 mmHg
MV VTI: 250 cm
MVPKAVEL: 104 m/s
MVPKEVEL: 78.6 m/s
PISA EROA: 0.02 cm2
PW: 10 mm — AB (ref 0.6–1.1)
RV LATERAL S' VELOCITY: 5.64 cm/s
RV sys press: 49 mmHg
Reg peak vel: 322 cm/s
Simpson's disk: 59
Stroke v: 35 ml
TAPSE: 15.7 mm
TDI e' medial: 6.66
TR max vel: 322 cm/s

## 2016-08-12 ENCOUNTER — Other Ambulatory Visit: Payer: PPO

## 2016-08-15 ENCOUNTER — Telehealth: Payer: Self-pay | Admitting: Cardiovascular Disease

## 2016-08-15 NOTE — Telephone Encounter (Signed)
Pt calling stating she was told by her PCP to call us for she need to get the results on her ECHO she did this past week Please call back

## 2016-08-15 NOTE — Telephone Encounter (Signed)
Spoke with patient and she wanted to know what her echocardiogram results showed. Reviewed results and it appears that this was ordered by PCP but that Dr. Rockey Situ had her do it here so he could review the results per her reports. She states that her PCP wanted her to follow up with Dr. Rockey Situ regarding these results. Reassured her that it was nothing emergent and scheduled her to come in and see Dr. Rockey Situ for 08/26/16 at 07:40AM. She was appreciative for the call back and had no further questions at this time.

## 2016-08-15 NOTE — Telephone Encounter (Signed)
Left ventricle function is normal There is a moderate to severely keep valve, tricuspid Moderately elevated right heart pressures that can contribute to shortness of breath I can go over this with her in clinic visit and discuss options

## 2016-08-19 ENCOUNTER — Other Ambulatory Visit: Payer: Self-pay | Admitting: Otolaryngology

## 2016-08-19 DIAGNOSIS — D333 Benign neoplasm of cranial nerves: Secondary | ICD-10-CM

## 2016-08-22 ENCOUNTER — Other Ambulatory Visit: Payer: Self-pay | Admitting: Pulmonary Disease

## 2016-08-23 NOTE — Progress Notes (Signed)
Cardiology Office Note  Date:  08/26/2016   ID:  Andrea [name], DOB Sep 19, 1940, MRN [id number]  PCP:  [name], MD   Chief Complaint  Patient presents with  . other    F/u echo c/o feeling exhausted and elevated BP. Meds reviewed verbally with pt.    HPI:  Andrea [name] is a pleasant 76 year old woman with history of  chronic bradycardia,  spinal stenosis,  peripheral vascular disease,  stents to her lower extremities status post CEA on the left, 40-59% residual disease on the right, 10/17 hyperlipidemia,  COPD, long smoking history for at least 30 years coronary artery disease and bypass surgery in 1999, at [facility] in [place] stent placement to distal RCA may 2013, In August 2017 she had 2 stents placed to vein graft to the RCA, also with stents to the mid left circumflex with resolution of her anginal symptoms Shepresents for routine followup Of her coronary artery disease .  She is part of the bio freedom study  cardiac catheterization 06/25/2016 occluded vein graft to the RCA, 60% left ostial subclavian vessel disease Medical management   On previous office visit she reported Increasing SOB Echo 08/11/2016 Normal EF, moderately elevated RVSP 57 mm Hg, moderate to severe TR  Chronic fatigue,  napping in the daytime Started on thyroid medication for TSH greater than 7 No regular exercise program Chronic bradycardia Shortness of breath On exertion   Carotid 10/17 40 to 59% disease on the right, <39% on the left  Denies any significant anginal symptoms previously stopped her Crestor, cholesterol from 144 up to 242 in May 30. 2017 Now back on crestor LDL 64, total chol 126    cath results Reviewed with her 1. Significant underlying three-vessel coronary artery disease with patent LIMA to LAD. The SVG to the RCA is now occluded proximally with left-to-right collaterals. The left circumflex stent is patent with no significant restenosis. 2. Mildly reduced LV  systolic function with an EF of 50-55% and inferior wall hypokinesis. Mildly elevated left ventricular end-diastolic pressure. 3. Aortic arch angiogram was done to evaluate the stenosis in the left subclavian artery. It showed 60% heavily calcified stenosis in the ostium of the left subclavian artery. There was about 29-52 mm systolic gradient across the stenosis.  Other past medical history reviewed cardiac catheterization 01/02/2016 She had 2 stents placed to the vein graft to the RCA, also angioplasty with stent placement to the mid left circumflex Following intervention she feels much better, no chest pain, shortness of breath improved Previously was taking nitroglycerin for anginal symptoms, now no longer needing nitroglycerin Was taking NTG 5 x in one day  She has a history of stents to her lower extremities, these have not been visualized in many years  Cath 01/02/16 Significant underlying three-vessel coronary artery disease. Patent LIMA to LAD. However, there is retrograde flow in the graft itself likely due to no obstructive disease down the native LAD. The native LAD has diffuse 60-70% calcified disease in the proximal and midsegment. Severe new disease in SVG to RCA. Collaterals were noted from left-to-right. Worsening disease in the native left circumflex which does not have a patent bypass.  2. Moderate left subclavian stenosis which was not significant by gradients. 3. Normal left ventricular end-diastolic pressure. Left ventricular angiography was not performed. 4. Successful angioplasty and drug-eluting stent placement to the SVG to RCA with 2 bio freedom stent placement. Successful angioplasty and stent placement to the mid left circumflex with 1 I freedom stent.  The SVG to RCA is an old graft with new areas of disease. I think this graft is high risk for closure in spite of intervention today. The patient was enrolled in the bio freedom study and thus dual antiplatelet  therapy is optional after one month. However, I recommend continuing Plavix indefinitely. The patient did have significant chest pain during SVG to RCA PCI with ST changes. By the end of the procedure, her chest pain resolved completely. However, mild ST elevation persisted possibly due to microvascular injury.   catheterization November 2012 Atretic LIMA to the LAD,  She has saphenous vein graft sequential to PDA and PL loss of distal limb of vein graft to the PDA and PL with left-to-right collaterals 40-50% proximal to mid LAD with focal 60-70% mid LAD 2 large OM's, 30% mid OM1, 40-50% ostial Ejection fraction 55% with inferobasal akinesis   PMH:   has a past medical history of AAA (abdominal aortic aneurysm) ([place]) (01/2009); Acute bronchitis (03/20/2013; 2017); Angina; Anosmia; Asthma; Baker's cyst of knee (01/30/2014); Basal cell carcinoma; Bradycardia; CAD (coronary artery disease); Cerebrovascular disease (01/2009); Chronic thoracic back pain; COPD (chronic obstructive pulmonary disease) ([place]); Depression; Dizziness; Dysrhythmia; GERD (gastroesophageal reflux disease); Heart murmur; Herniated lumbar disc without myelopathy; History of blood transfusion (1999); Hyperglycemia (10/23/2015); Hyperlipidemia; Hypertension; Medicare annual wellness visit, subsequent (07/31/2013); NSTEMI (non-ST elevated myocardial infarction) ([place]) (11/12); Osteoarthritis of back; Osteoporosis; Pneumonia ("several times"); Pulmonary embolism ([place]) (1999); PVD (peripheral vascular disease) ([place]); Shortness of breath; and Squamous carcinoma.  PSH:    Past Surgical History:  Procedure Laterality Date  . ABDOMINAL AORTIC ANEURYSM REPAIR     pt denies this hx on 01/02/2016  . BASAL CELL CARCINOMA EXCISION    . CARDIAC CATHETERIZATION N/A 01/02/2016   Procedure: Left Heart Cath and Coronary Angiography;  Surgeon: Wellington Hampshire, MD;  Location: [place] [facility];  Service: Cardiovascular;  Laterality: N/A;  . CARDIAC  CATHETERIZATION  1996; 1999  . CARDIAC CATHETERIZATION N/A 06/25/2016   Procedure: Left Heart Cath and Cors/Grafts Angiography;  Surgeon: Wellington Hampshire, MD;  Location: [facility];  Service: Cardiovascular;  Laterality: N/A;  . CAROTID ENDARTERECTOMY Left 01/02/2011  . CATARACT EXTRACTION W/ INTRAOCULAR LENS  IMPLANT, BILATERAL    . CLOSED REDUCTION NASAL FRACTURE  11/2007  . CORONARY ANGIOPLASTY WITH STENT PLACEMENT  10/10/2011   drug eluting  to rc & saphenous  . CORONARY ARTERY BYPASS GRAFT  1999   "CABG X3"  . DILATION AND CURETTAGE OF UTERUS    . FEMORAL ARTERY STENT Bilateral   . FRACTURE SURGERY    . LEFT HEART CATHETERIZATION WITH CORONARY/GRAFT ANGIOGRAM N/A 04/22/2011   Procedure: LEFT HEART CATHETERIZATION WITH Beatrix Fetters;  Surgeon: Jolaine Artist, MD;  Location[facility];  Service: Cardiovascular;  Laterality: N/A;  . LEFT HEART CATHETERIZATION WITH CORONARY/GRAFT ANGIOGRAM N/A 10/10/2011   Procedure: LEFT HEART CATHETERIZATION WITH Beatrix Fetters;  Surgeon: Peter M Martinique, MD;  Location: [facility] CATH LAB;  Service: Cardiovascular;  Laterality: N/A;  . NASAL SINUS SURGERY     twice  . SQUAMOUS CELL CARCINOMA EXCISION    . TUBAL LIGATION      Current Outpatient Prescriptions  Medication Sig Dispense Refill  . acetaminophen (TYLENOL) 650 MG CR tablet Take 1,300 mg by mouth 2 (two) times daily.    Marland Kitchen albuterol (PROVENTIL HFA;VENTOLIN HFA) 108 (90 Base) MCG/ACT inhaler Inhale 2 puffs into the lungs every 6 (six) hours as needed for wheezing. 1 Inhaler 11  .  budesonide-formoterol (SYMBICORT) 160-4.5 MCG/ACT inhaler INHALE 2 PUFFS INTO THE LUNGS 2 (TWO) TIMES DAILY. 10.2 Inhaler 11  . Calcium Carbonate-Vitamin D (CALCIUM + D PO) Take 1 tablet by mouth 2 (two) times daily.    . clopidogrel (PLAVIX) 75 MG tablet Take 1 tablet (75 mg total) by mouth daily. 90 tablet 3  . fluticasone (FLONASE) 50 MCG/ACT nasal spray Place 2 sprays into both nostrils  daily. For congestion 48 g 3  . HYDROcodone-acetaminophen (NORCO) 10-325 MG tablet Take 1 tablet by mouth every 6 (six) hours as needed. Back pain 20 tablet 0  . isosorbide mononitrate (IMDUR) 60 MG 24 hr tablet Take 1 tablet (60 mg total) by mouth 2 (two) times daily. 180 tablet 3  . Krill Oil 1000 MG CAPS Take 1,000 mg by mouth daily.    Marland Kitchen levothyroxine (SYNTHROID, LEVOTHROID) 25 MCG tablet Take 1 tablet (25 mcg total) by mouth daily before breakfast. 90 tablet 3  . metoprolol tartrate (LOPRESSOR) 25 MG tablet Take 0.5 tablets (12.5 mg total) by mouth 2 (two) times daily. 180 tablet 3  . nitroGLYCERIN (NITROSTAT) 0.4 MG SL tablet Place 1 tablet (0.4 mg total) under the tongue every 5 (five) minutes as needed for chest pain. 25 tablet 2  . Polyethyl Glycol-Propyl Glycol (SYSTANE OP) Place 1 drop into both eyes 3 (three) times daily as needed (dry eyes).     . potassium chloride (K-DUR) 10 MEQ tablet Take 1 tablet (10 mEq total) by mouth daily as needed. (Patient taking differently: Take 10-20 mEq by mouth daily as needed (for cramping/lab results.). ) 90 tablet 3  . rosuvastatin (CRESTOR) 40 MG tablet Take 1 tablet (40 mg total) by mouth daily. 90 tablet 3  . sertraline (ZOLOFT) 50 MG tablet TAKE 1 TABLET EVERY DAY **NEED OFFICE VISIT 90 tablet 1  . Spacer/Aero-Holding Chambers DEVI 1 spacer to use prn with HFA device as directed 1 each 0  . tiotropium (SPIRIVA) 18 MCG inhalation capsule Place 1 capsule (18 mcg total) into inhaler and inhale daily. 30 capsule 11  . Vitamin D, Ergocalciferol, (DRISDOL) 50000 units CAPS capsule Take 1 capsule (50,000 Units total) by mouth every 7 (seven) days. 4 capsule 4  . furosemide (LASIX) 20 MG tablet Take 1 tablet (20 mg total) by mouth daily as needed. 90 tablet 3   No current facility-administered medications for this visit.      Allergies:   Codeine; Erythromycin; Meperidine hcl; Shellfish allergy; Ciprofloxacin; Penicillins; and Tape   Social History:   The patient  reports that she quit smoking about 23 years ago. Her smoking use included Cigarettes. She has a 20.00 pack-year smoking history. She has never used smokeless tobacco. She reports that she drinks alcohol. She reports that she does not use drugs.   Family History:   family history includes Alcohol abuse in her brother; Aneurysm in her brother; Cancer in her father and sister; Cirrhosis in her maternal grandfather; Colon cancer (age of onset: 2) in her father; Diabetes in her maternal grandmother and mother; Heart attack in her brother and brother; Heart attack (age of onset: 34) in her mother; Heart disease in her brother, brother, and father; Hyperlipidemia in her daughter; Hypertension in her brother and father; Hypothyroidism in her sister; Lung cancer in her father and sister; Obesity in her daughter and son; Stroke in her maternal grandmother and paternal grandmother.    Review of Systems: Review of Systems  Constitutional: Positive for malaise/fatigue.  Respiratory: Positive for shortness of  breath.   Cardiovascular: Negative.   Gastrointestinal: Negative.   Musculoskeletal: Negative.   Neurological: Negative.   Psychiatric/Behavioral: Negative.   All other systems reviewed and are negative.    PHYSICAL EXAM: VS:  BP 136/84 (BP Location: Left Arm, Patient Position: Sitting, Cuff Size: Normal)   Pulse 74   Ht '5\' 2"'$  (1.575 m)   Wt 141 lb 8 oz (64.2 kg)   BMI 25.88 kg/m  , BMI Body mass index is 25.88 kg/m. GEN: Well nourished, well developed, in no acute distress  HEENT: normal  Neck: no JVD, carotid bruits, or masses Cardiac: RRR; no murmurs, rubs, or gallops,no edema  Respiratory:  Moderately decreased breath sounds throughout,  normal work of breathing GI: soft, nontender, nondistended, + BS MS: no deformity or atrophy  Skin: warm and dry, no rash Neuro:  Strength and sensation are intact Psych: euthymic mood, full affect    Recent Labs: 07/03/2016: ALT 13;  BUN 9; Creatinine, Ser 0.84; Hemoglobin 12.2; Magnesium 1.8; Platelets 151.0; Potassium 3.9; Sodium 145; TSH 7.01    Lipid Panel Lab Results  Component Value Date   CHOL 126 07/03/2016   HDL 30.60 (L) 07/03/2016   LDLCALC 183 (H) 03/07/2016   TRIG 209.0 (H) 07/03/2016      Wt Readings from Last 3 Encounters:  08/26/16 141 lb 8 oz (64.2 kg)  07/17/16 140 lb (63.5 kg)  07/03/16 144 lb 9.6 oz (65.6 kg)       ASSESSMENT AND PLAN:  Mixed hyperlipidemia Cholesterol is at goal on the current lipid regimen. No changes to the medications were made.  Essential hypertension Blood pressure is well controlled on today's visit. No changes made to the medications.  Coronary artery disease involving coronary bypass graft of native heart with angina pectoris [facility]) Denies any anginal symptoms on exertion  Bilateral carotid artery stenosis Endarterectomy on the left, 40-50% disease on the right  Coronary artery disease of native artery of native heart with stable angina pectoris (HCC) Currently with no symptoms of angina. No further workup at this time. Continue current medication regimen.  Pulmonary HTN Likely secondary to underlying lung disease, 30 years of smoking Recommended she take Lasix with potassium 3 days in a row then consider every other day or as needed for shortness of breath symptoms  talked about how to titrate her Lasix with potassium. We'll see if she can tolerate Lasix in general. We will try to decrease her right heart pressures are diuresis. We did talk about other options such as revatio TID.  Non-rheumatic tricuspid valve insufficiency Moderate to severe TR on echo likely from elevated right heart pressures  Chronic fatigue We have offered to enroll her in heart track She has declined at this time  Disposition:   F/U  6 months   Total encounter time more than 25 minutes  Greater than 50% was spent in counseling and coordination of care with the  patient   No orders of the defined types were placed in this encounter.    Signed, Esmond Plants, M.D., Ph.D. 08/26/2016  [place], [place]

## 2016-08-26 ENCOUNTER — Ambulatory Visit (INDEPENDENT_AMBULATORY_CARE_PROVIDER_SITE_OTHER): Payer: PPO | Admitting: Cardiovascular Disease

## 2016-08-26 ENCOUNTER — Encounter: Payer: Self-pay | Admitting: Cardiovascular Disease

## 2016-08-26 ENCOUNTER — Other Ambulatory Visit: Payer: PPO

## 2016-08-26 VITALS — BP 136/84 | HR 74 | Ht 62.0 in | Wt 141.5 lb

## 2016-08-26 DIAGNOSIS — I6523 Occlusion and stenosis of bilateral carotid arteries: Secondary | ICD-10-CM

## 2016-08-26 DIAGNOSIS — I25118 Atherosclerotic heart disease of native coronary artery with other forms of angina pectoris: Secondary | ICD-10-CM | POA: Diagnosis not present

## 2016-08-26 DIAGNOSIS — I361 Nonrheumatic tricuspid (valve) insufficiency: Secondary | ICD-10-CM | POA: Diagnosis not present

## 2016-08-26 DIAGNOSIS — I25709 Atherosclerosis of coronary artery bypass graft(s), unspecified, with unspecified angina pectoris: Secondary | ICD-10-CM | POA: Diagnosis not present

## 2016-08-26 DIAGNOSIS — I272 Pulmonary hypertension, unspecified: Secondary | ICD-10-CM | POA: Diagnosis not present

## 2016-08-26 DIAGNOSIS — E782 Mixed hyperlipidemia: Secondary | ICD-10-CM | POA: Diagnosis not present

## 2016-08-26 DIAGNOSIS — I10 Essential (primary) hypertension: Secondary | ICD-10-CM | POA: Diagnosis not present

## 2016-08-26 MED ORDER — FUROSEMIDE 20 MG PO TABS: 20.0000 mg | ORAL_TABLET | Freq: Every day | ORAL | 3 refills | Status: DC | PRN

## 2016-08-26 NOTE — Patient Instructions (Signed)
Medication Instructions:   Please start lasix one pill daily with potassium 10 daily  Labwork:  No new labs needed  Testing/Procedures:  No further testing at this time   I recommend watching educational videos on topics of interest to you at:       [email or web address]  Enter code: [email or web address]    Follow-Up: It was a pleasure seeing you in the office today. Please call us if you have new issues that need to be addressed before your next appt.  [phone]  Your physician wants you to follow-up in: 6 months.  You will receive a reminder letter in the mail two months in advance. If you don't receive a letter, please call our office to schedule the follow-up appointment.  If you need a refill on your cardiac medications before your next appointment, please call your pharmacy.

## 2016-09-02 ENCOUNTER — Ambulatory Visit
Admission: RE | Admit: 2016-09-02 | Discharge: 2016-09-02 | Disposition: A | Discharge status: Home / Self Care | Payer: PPO | Source: Ambulatory Visit | Attending: Otolaryngology | Admitting: Otolaryngology

## 2016-09-02 DIAGNOSIS — H9190 Unspecified hearing loss, unspecified ear: Secondary | ICD-10-CM | POA: Diagnosis not present

## 2016-09-02 DIAGNOSIS — D333 Benign neoplasm of cranial nerves: Secondary | ICD-10-CM

## 2016-09-02 MED ORDER — GADOBENATE DIMEGLUMINE 529 MG/ML IV SOLN
13.0000 mL | Freq: Once | INTRAVENOUS | Status: AC | PRN
  Administered 2016-09-02: 13 mL via INTRAVENOUS

## 2016-09-03 DIAGNOSIS — H903 Sensorineural hearing loss, bilateral: Secondary | ICD-10-CM | POA: Diagnosis not present

## 2016-09-17 ENCOUNTER — Other Ambulatory Visit: Payer: Self-pay | Admitting: Family Medicine

## 2016-09-29 ENCOUNTER — Other Ambulatory Visit: Payer: Self-pay | Admitting: Family Medicine

## 2016-09-29 MED ORDER — VITAMIN D (ERGOCALCIFEROL) 1.25 MG (50000 UNIT) PO CAPS: 50000.0000 [IU] | ORAL_CAPSULE | ORAL | 4 refills | Status: DC

## 2016-09-29 NOTE — Progress Notes (Signed)
Pre visit review using our clinic review tool, if applicable. No additional management support is needed unless otherwise documented below in the visit note. 

## 2016-09-29 NOTE — Progress Notes (Signed)
Subjective:   Andrea [name] is a 76 y.o. female who presents for Medicare Annual (Subsequent) preventive examination.  Review of Systems:  No ROS.  Medicare Wellness Visit. Cardiac Risk Factors include: none;advanced age (>49mn, >>53women);dyslipidemia;hypertension;sedentary lifestyle Sleep patterns: sleeps 6-8 hrs. Occasionally naps. Home Safety/Smoke Alarms:  Feels safe in home. Smoke alarms in place.  Living environment; residence and Firearm Safety: Lives with husband. Guns safely stored. Seat Belt Safety/Bike Helmet: Wears seat belt.   Counseling:   Eye Exam- Wears reading glasses. Eye doctor yearly. Dental- No dentist. Upper and lower denture. Gum care discussed.   Female:   Pap- pt states she will schedule appt.    Mammo- Last 10/29/15:    BI-RADS CATEGORY  1: Negative. Dexa scan-  Last 10/29/15:  osteopenia    CCS- Last 12/15/07: diverticulosis     Objective:     Vitals: BP (!) 150/76 (BP Location: Right Arm, Patient Position: Sitting, Cuff Size: Normal)   Pulse 62   Ht '5\' 2"'$  (1.575 m)   Wt 140 lb (63.5 kg)   SpO2 97%   BMI 25.61 kg/m   Body mass index is 25.61 kg/m.   Tobacco History  Smoking Status  . Former Smoker  . Packs/day: 0.50  . Years: 40.00  . Types: Cigarettes  . Quit date: 01/19/1993  Smokeless Tobacco  . Never Used     Counseling given: Not Answered   Past Medical History:  Diagnosis Date  . AAA [place]abdominal aortic aneurysm) ([facility] 01/2009   AAA (2.8 x 3.0)  moderate RAS (left); 2.7 x 2.7 cm (07/11/05)  . Acute bronchitis 03/20/2013; 2017  . Angina   . Anosmia   . Asthma   . Baker's cyst of knee 01/30/2014  . Basal cell carcinoma    "back and left arm"  . Bradycardia    Metoprolol stopped 08/2011  . CAD (coronary artery disease)    s/CABG (reports IMA and 2 SVGs) back in 1999  Myoview normal 3/10; s/p PCI with DES to PL branch of distal RCA 09/2011; PCI +DES to SVG-RCA, PCI + DES to mid LCx 12/2015  . Cerebrovascular disease 01/2009   carotid u/s  R 0-39%   L 60-79%  . Chronic thoracic back pain   . COPD (chronic obstructive pulmonary disease) ([facility]   . Depression   . Dizziness   . Dysrhythmia    hx of sinus brady  . GERD (gastroesophageal reflux disease)   . Heart murmur   . Herniated lumbar disc without myelopathy   . History of blood transfusion 1999   "when I had the bypass; had a PE"  . Hyperglycemia 10/23/2015  . Hyperlipidemia   . Hypertension   . Medicare annual wellness visit, subsequent 07/31/2013  . NSTEMI (non-ST elevated myocardial infarction) ([facility] 11/12   Cath showed atretic IMA graft to the LAD, SVG to PD was patent but the continuation of this graft to the PL branch was occluded; there were L to R collaterals; Mid LAD had a 60 to 70% stenosis. She has been treated medically.  Neg Myoview 05/2011  . Osteoarthritis of back   . Osteoporosis   . Pneumonia "several times"  . [place]monary embolism ([facility] [place] 1999   "after my bypass"  . PVD (peripheral vascular disease) ([facility]   . Shortness of breath   . Squamous carcinoma    "nose"  . Thyroid disease    Hypothyroid   Past Surgical History:  Procedure Laterality Date  . ABDOMINAL AORTIC ANEURYSM  REPAIR     pt denies this hx on 01/02/2016  . BASAL CELL CARCINOMA EXCISION    . CARDIAC CATHETERIZATION N/A 01/02/2016   Procedure: Left Heart Cath and Coronary Angiography;  Surgeon: Wellington Hampshire, MD;  Location: [facility];  Service: Cardiovascular;  Laterality: N/A;  . CARDIAC CATHETERIZATION  1996; 1999  . CARDIAC CATHETERIZATION N/A 06/25/2016   Procedure: Left Heart Cath and Cors/Grafts Angiography;  Surgeon: Wellington Hampshire, MD;  Location: [facility];  Service: Cardiovascular;  Laterality: N/A;  . CAROTID ENDARTERECTOMY Left 01/02/2011  . CATARACT EXTRACTION W/ INTRAOCULAR LENS  IMPLANT, BILATERAL    . CLOSED REDUCTION NASAL FRACTURE  11/2007  . CORONARY ANGIOPLASTY WITH STENT PLACEMENT  10/10/2011   drug eluting  to rc & saphenous  . CORONARY  ARTERY BYPASS GRAFT  1999   "CABG X3"  . DILATION AND CURETTAGE OF UTERUS    . FEMORAL ARTERY STENT Bilateral   . FRACTURE SURGERY    . LEFT HEART CATHETERIZATION WITH CORONARY/GRAFT ANGIOGRAM N/A 04/22/2011   Procedure: LEFT HEART CATHETERIZATION WITH Beatrix Fetters;  Surgeon: Jolaine Artist, MD;  [facility];  Service: Cardiovascular;  Laterality: N/A;  . LEFT HEART CATHETERIZATION WITH CORONARY/GRAFT ANGIOGRAM N/A 10/10/2011   Procedure: LEFT HEART CATHETERIZATION WITH Beatrix Fetters;  Surgeon: Peter M Martinique, MD;  Location: [facility] CATH LAB;  Service: Cardiovascular;  Laterality: N/A;  . NASAL SINUS SURGERY     twice  . SQUAMOUS CELL CARCINOMA EXCISION    . TUBAL LIGATION     Family History  Problem Relation Age of Onset  . Heart attack Mother 23  . Diabetes Mother   . Colon cancer Father 74  . Lung cancer Father     smoked  . Heart disease Father     angina  . Hypertension Father   . Cancer Father     colon, lung  . Lung cancer Sister     smoked  . Cancer Sister   . Hypothyroidism Sister   . Hypertension Brother   . Heart attack Brother   . Heart disease Brother     stent  . Heart attack Brother     CABG  . Heart disease Brother     CABG with 1 bypass  . Aneurysm Brother     brain  . Alcohol abuse Brother   . Hyperlipidemia Daughter   . Diabetes Maternal Grandmother   . Stroke Maternal Grandmother   . Cirrhosis Maternal Grandfather   . Stroke Paternal Grandmother   . Obesity Daughter   . Obesity Son    History  Sexual Activity  . Sexual activity: Not Currently  . Birth control/ protection: Post-menopausal    Outpatient Encounter Prescriptions as of 09/30/2016  Medication Sig  . acetaminophen (TYLENOL) 650 MG CR tablet Take 1,300 mg by mouth 2 (two) times daily.  Marland Kitchen albuterol (PROVENTIL HFA;VENTOLIN HFA) 108 (90 Base) MCG/ACT inhaler Inhale 2 puffs into the lungs every 6 (six) hours as needed for wheezing.  .  budesonide-formoterol (SYMBICORT) 160-4.5 MCG/ACT inhaler INHALE 2 PUFFS INTO THE LUNGS 2 (TWO) TIMES DAILY.  . Calcium Carbonate-Vitamin D (CALCIUM + D PO) Take 1 tablet by mouth 2 (two) times daily.  . clopidogrel (PLAVIX) 75 MG tablet Take 1 tablet (75 mg total) by mouth daily.  . fluticasone (FLONASE) 50 MCG/ACT nasal spray Place 2 sprays into both nostrils daily. For congestion  . furosemide (LASIX) 20 MG tablet Take 1 tablet (20 mg  total) by mouth daily as needed.  Marland Kitchen HYDROcodone-acetaminophen (NORCO) 10-325 MG tablet Take 1 tablet by mouth every 6 (six) hours as needed. Back pain  . isosorbide mononitrate (IMDUR) 60 MG 24 hr tablet Take 1 tablet (60 mg total) by mouth 2 (two) times daily.  Javier Docker Oil 1000 MG CAPS Take 1,000 mg by mouth daily.  Marland Kitchen levothyroxine (SYNTHROID, LEVOTHROID) 25 MCG tablet Take 1 tablet (25 mcg total) by mouth daily before breakfast.  . nitroGLYCERIN (NITROSTAT) 0.4 MG SL tablet Place 1 tablet (0.4 mg total) under the tongue every 5 (five) minutes as needed for chest pain.  Vladimir Faster Glycol-Propyl Glycol (SYSTANE OP) Place 1 drop into both eyes 3 (three) times daily as needed (dry eyes).   . potassium chloride (K-DUR) 10 MEQ tablet Take 1 tablet (10 mEq total) by mouth daily as needed. (Patient taking differently: Take 10-20 mEq by mouth daily as needed (for cramping/lab results.). )  . rosuvastatin (CRESTOR) 40 MG tablet Take 1 tablet (40 mg total) by mouth daily.  . sertraline (ZOLOFT) 50 MG tablet TAKE 1 TABLET EVERY DAY **NEED OFFICE VISIT  . Spacer/Aero-Holding Chambers DEVI 1 spacer to use prn with HFA device as directed  . SYMBICORT 160-4.5 MCG/ACT inhaler INHALE 2 PUFFS INTO THE LUNGS 2 (TWO) TIMES DAILY.  Marland Kitchen tiotropium (SPIRIVA) 18 MCG inhalation capsule Place 1 capsule (18 mcg total) into inhaler and inhale daily.  . Vitamin D, Ergocalciferol, (DRISDOL) 50000 units CAPS capsule Take 1 capsule (50,000 Units total) by mouth every 7 (seven) days.  . metoprolol  tartrate (LOPRESSOR) 25 MG tablet Take 0.5 tablets (12.5 mg total) by mouth 2 (two) times daily.  . [DISCONTINUED] Vitamin D, Ergocalciferol, (DRISDOL) 50000 units CAPS capsule Take 1 capsule (50,000 Units total) by mouth every 7 (seven) days.   No facility-administered encounter medications on file as of 09/30/2016.     Activities of Daily Living In your present state of health, do you have any difficulty performing the following activities: 09/30/2016 06/25/2016  Hearing? Y N  Vision? N N  Difficulty concentrating or making decisions? Y N  Walking or climbing stairs? N N  Dressing or bathing? N N  Doing errands, shopping? N -  Preparing Food and eating ? N -  Using the Toilet? N -  In the past six months, have you accidently leaked urine? N -  Do you have problems with loss of bowel control? N -  Managing your Medications? N -  Managing your Finances? N -  Housekeeping or managing your Housekeeping? N -  Some recent data might be hidden    Patient Care Team: Mosie Lukes, MD as PCP - General (Family Medicine) Martinique, Peter M, MD (Cardiology) Minna Merritts, MD as Consulting Physician (Cardiology)    Assessment:    Physical assessment deferred to PCP.  Exercise Activities and Dietary recommendations Current Exercise Habits: The patient does not participate in regular exercise at present, Exercise limited by: None identified   Diet (meal preparation, eat out, water intake, caffeinated beverages, dairy products, fruits and vegetables): in general, a "healthy" diet       Goals    . Weight (lb) < 135 lb (61.2 kg)      Fall Risk Fall Risk  09/30/2016 03/10/2016 03/10/2016 01/31/2016 08/28/2015  Falls in the past year? No No No No No   Depression Screen PHQ 2/9 Scores 09/30/2016 03/10/2016 03/10/2016 02/13/2016  PHQ - 2 Score 0 0 0 0  PHQ- 9 Score - - - -  Exception Documentation - - Patient refusal -     Cognitive Function MMSE - Mini Mental State Exam 09/30/2016    Orientation to time 5  Orientation to Place 5  Registration 3  Attention/ Calculation 5  Recall 3  Language- name 2 objects 2  Language- repeat 1  Language- follow 3 step command 3  Language- read & follow direction 1  Write a sentence 1  Copy design 1  Total score 30        Immunization History  Administered Date(s) Administered  . Influenza,inj,Quad PF,36+ Mos 04/15/2016  . Pneumococcal Conjugate-13 01/24/2014  . Pneumococcal Polysaccharide-23 05/25/2002  . Td 05/24/1999  . Tdap 01/24/2014   Screening Tests Health Maintenance  Topic Date Due  . PNA vac Low Risk Adult (2 of 2 - PPSV23) 01/25/2015  . INFLUENZA VACCINE  12/24/2016  . TETANUS/TDAP  01/25/2024  . DEXA SCAN  Completed      Plan:   Follow up with PCP today as scheduled.  Continue to eat heart healthy diet (full of fruits, vegetables, whole grains, lean protein, water--limit salt, fat, and sugar intake) and increase physical activity as tolerated.  Continue doing brain stimulating activities (puzzles, reading, adult coloring books, staying active) to keep memory sharp.    I have personally reviewed and noted the following in the patient's chart:   . Medical and social history . Use of alcohol, tobacco or illicit drugs  . Current medications and supplements . Functional ability and status . Nutritional status . Physical activity . Advanced directives . List of other physicians . Hospitalizations, surgeries, and ER visits in previous 12 months . Vitals . Screenings to include cognitive, depression, and falls . Referrals and appointments  In addition, I have reviewed and discussed with patient certain preventive protocols, quality metrics, and best practice recommendations. A written personalized care plan for preventive services as well as general preventive health recommendations were provided to patient.     Shela Nevin, [place]  09/30/2016   Medical screening examination was performed by  Health Coach and as supervising physician I was immediately available for consultation/collaboration. I have reviewed documentation and agree with assessment and plan.  [name], MD

## 2016-09-30 ENCOUNTER — Encounter: Payer: Self-pay | Admitting: Family Medicine

## 2016-09-30 ENCOUNTER — Ambulatory Visit (INDEPENDENT_AMBULATORY_CARE_PROVIDER_SITE_OTHER): Payer: PPO | Admitting: Family Medicine

## 2016-09-30 VITALS — BP 150/76 | HR 62 | Ht 62.0 in | Wt 140.0 lb

## 2016-09-30 DIAGNOSIS — I208 Other forms of angina pectoris: Secondary | ICD-10-CM | POA: Diagnosis not present

## 2016-09-30 DIAGNOSIS — Z0000 Encounter for general adult medical examination without abnormal findings: Secondary | ICD-10-CM

## 2016-09-30 DIAGNOSIS — R739 Hyperglycemia, unspecified: Secondary | ICD-10-CM | POA: Diagnosis not present

## 2016-09-30 DIAGNOSIS — M810 Age-related osteoporosis without current pathological fracture: Secondary | ICD-10-CM | POA: Diagnosis not present

## 2016-09-30 DIAGNOSIS — I10 Essential (primary) hypertension: Secondary | ICD-10-CM | POA: Diagnosis not present

## 2016-09-30 DIAGNOSIS — J449 Chronic obstructive pulmonary disease, unspecified: Secondary | ICD-10-CM | POA: Diagnosis not present

## 2016-09-30 DIAGNOSIS — E039 Hypothyroidism, unspecified: Secondary | ICD-10-CM

## 2016-09-30 DIAGNOSIS — E782 Mixed hyperlipidemia: Secondary | ICD-10-CM

## 2016-09-30 HISTORY — DX: Hypothyroidism, unspecified: E03.9

## 2016-09-30 LAB — LIPID PANEL
Cholesterol: 142 mg/dL (ref 0–200)
HDL: 34.9 mg/dL — AB (ref >39.00)
NonHDL: 107.11
TRIGLYCERIDES: 261 mg/dL — AB (ref 0.0–149.0)
Total CHOL/HDL Ratio: 4
VLDL: 52.2 mg/dL — ABNORMAL HIGH (ref 0.0–40.0)

## 2016-09-30 LAB — COMPREHENSIVE METABOLIC PANEL
ALK PHOS: 74 U/L (ref 39–117)
ALT: 11 U/L (ref 0–35)
AST: 15 U/L (ref 0–37)
Albumin: 4.2 g/dL (ref 3.5–5.2)
BILIRUBIN TOTAL: 0.4 mg/dL (ref 0.2–1.2)
BUN: 15 mg/dL (ref 6–23)
CALCIUM: 9.7 mg/dL (ref 8.4–10.5)
CO2: 34 mEq/L — ABNORMAL HIGH (ref 19–32)
CREATININE: 0.94 mg/dL (ref 0.40–1.20)
Chloride: 104 mEq/L (ref 96–112)
GFR: 61.28 mL/min (ref >60.00)
Glucose, Bld: 91 mg/dL (ref 70–99)
Potassium: 3.5 mEq/L (ref 3.5–5.1)
Sodium: 142 mEq/L (ref 135–145)
TOTAL PROTEIN: 6.9 g/dL (ref 6.0–8.3)

## 2016-09-30 LAB — CBC
HCT: 42.9 % (ref 36.0–46.0)
HEMOGLOBIN: 14 g/dL (ref 12.0–15.0)
MCHC: 32.7 g/dL (ref 30.0–36.0)
MCV: 82.1 fl (ref 78.0–100.0)
PLATELETS: 181 10*3/uL (ref 150.0–400.0)
RBC: 5.22 Mil/uL — AB (ref 3.87–5.11)
RDW: 15.9 % — ABNORMAL HIGH (ref 11.5–15.5)
WBC: 6.3 10*3/uL (ref 4.0–10.5)

## 2016-09-30 LAB — TSH: TSH: 2.5 u[IU]/mL (ref 0.35–4.50)

## 2016-09-30 LAB — T4, FREE: FREE T4: 0.71 ng/dL (ref 0.60–1.60)

## 2016-09-30 LAB — HEMOGLOBIN A1C: Hgb A1c MFr Bld: 6.2 % (ref 4.6–6.5)

## 2016-09-30 LAB — LDL CHOLESTEROL, DIRECT: LDL DIRECT: 74 mg/dL

## 2016-09-30 MED ORDER — HYDROCODONE-ACETAMINOPHEN 10-325 MG PO TABS: 1.0000 | ORAL_TABLET | Freq: Four times a day (QID) | ORAL | 0 refills | Status: DC | PRN

## 2016-09-30 MED ORDER — METOPROLOL TARTRATE 25 MG PO TABS: 25.0000 mg | ORAL_TABLET | Freq: Two times a day (BID) | ORAL | 3 refills | Status: DC

## 2016-09-30 MED ORDER — MONTELUKAST SODIUM 10 MG PO TABS: 10.0000 mg | ORAL_TABLET | Freq: Every evening | ORAL | 3 refills | Status: DC | PRN

## 2016-09-30 NOTE — Assessment & Plan Note (Signed)
Encouraged to get adequate exercise, calcium and vitamin d intake 

## 2016-09-30 NOTE — Patient Instructions (Signed)
Continue to eat heart healthy diet (full of fruits, vegetables, whole grains, lean protein, water--limit salt, fat, and sugar intake) and increase physical activity as tolerated.  Continue doing brain stimulating activities (puzzles, reading, adult coloring books, staying active) to keep memory sharp.   Health Maintenance, Female Adopting a healthy lifestyle and getting preventive care can go a long way to promote health and wellness. Talk with your health care provider about what schedule of regular examinations is right for you. This is a good chance for you to check in with your provider about disease prevention and staying healthy. In between checkups, there are plenty of things you can do on your own. Experts have done a lot of research about which lifestyle changes and preventive measures are most likely to keep you healthy. Ask your health care provider for more information. Weight and diet Eat a healthy diet  Be sure to include plenty of vegetables, fruits, low-fat dairy products, and lean protein.  Do not eat a lot of foods high in solid fats, added sugars, or salt.  Get regular exercise. This is one of the most important things you can do for your health.  Most adults should exercise for at least 150 minutes each week. The exercise should increase your heart rate and make you sweat (moderate-intensity exercise).  Most adults should also do strengthening exercises at least twice a week. This is in addition to the moderate-intensity exercise. Maintain a healthy weight  Body mass index (BMI) is a measurement that can be used to identify possible weight problems. It estimates body fat based on height and weight. Your health care provider can help determine your BMI and help you achieve or maintain a healthy weight.  For females 62 years of age and older:  A BMI below 18.5 is considered underweight.  A BMI of 18.5 to 24.9 is normal.  A BMI of 25 to 29.9 is considered  overweight.  A BMI of 30 and above is considered obese. Watch levels of cholesterol and blood lipids  You should start having your blood tested for lipids and cholesterol at 78 years of age, then have this test every 5 years.  You may need to have your cholesterol levels checked more often if:  Your lipid or cholesterol levels are high.  You are older than 78 years of age.  You are at high risk for heart disease. Cancer screening Lung Cancer  Lung cancer screening is recommended for adults 28-22 years old who are at high risk for lung cancer because of a history of smoking.  A yearly low-dose CT scan of the lungs is recommended for people who:  Currently smoke.  Have quit within the past 15 years.  Have at least a 30-pack-year history of smoking. A pack year is smoking an average of one pack of cigarettes a day for 1 year.  Yearly screening should continue until it has been 15 years since you quit.  Yearly screening should stop if you develop a health problem that would prevent you from having lung cancer treatment. Breast Cancer  Practice breast self-awareness. This means understanding how your breasts normally appear and feel.  It also means doing regular breast self-exams. Let your health care provider know about any changes, no matter how small.  If you are in your 20s or 30s, you should have a clinical breast exam (CBE) by a health care provider every 1-3 years as part of a regular health exam.  If you are 40 or  or older, have a CBE every year. Also consider having a breast X-ray (mammogram) every year.  If you have a family history of breast cancer, talk to your health care provider about genetic screening.  If you are at high risk for breast cancer, talk to your health care provider about having an MRI and a mammogram every year.  Breast cancer gene (BRCA) assessment is recommended for women who have family members with BRCA-related cancers. BRCA-related cancers  include:  Breast.  Ovarian.  Tubal.  Peritoneal cancers.  Results of the assessment will determine the need for genetic counseling and BRCA1 and BRCA2 testing. Cervical Cancer  Your health care provider may recommend that you be screened regularly for cancer of the pelvic organs (ovaries, uterus, and vagina). This screening involves a pelvic examination, including checking for microscopic changes to the surface of your cervix (Pap test). You may be encouraged to have this screening done every 3 years, beginning at age 21.  For women ages 30-65, health care providers may recommend pelvic exams and Pap testing every 3 years, or they may recommend the Pap and pelvic exam, combined with testing for human papilloma virus (HPV), every 5 years. Some types of HPV increase your risk of cervical cancer. Testing for HPV may also be done on women of any age with unclear Pap test results.  Other health care providers may not recommend any screening for nonpregnant women who are considered low risk for pelvic cancer and who do not have symptoms. Ask your health care provider if a screening pelvic exam is right for you.  If you have had past treatment for cervical cancer or a condition that could lead to cancer, you need Pap tests and screening for cancer for at least 20 years after your treatment. If Pap tests have been discontinued, your risk factors (such as having a new sexual partner) need to be reassessed to determine if screening should resume. Some women have medical problems that increase the chance of getting cervical cancer. In these cases, your health care provider may recommend more frequent screening and Pap tests. Colorectal Cancer  This type of cancer can be detected and often prevented.  Routine colorectal cancer screening usually begins at 78 years of age and continues through 78 years of age.  Your health care provider may recommend screening at an earlier age if you have risk factors  for colon cancer.  Your health care provider may also recommend using home test kits to check for hidden blood in the stool.  A small camera at the end of a tube can be used to examine your colon directly (sigmoidoscopy or colonoscopy). This is done to check for the earliest forms of colorectal cancer.  Routine screening usually begins at age 50.  Direct examination of the colon should be repeated every 5-10 years through 78 years of age. However, you may need to be screened more often if early forms of precancerous polyps or small growths are found. Skin Cancer  Check your skin from head to toe regularly.  Tell your health care provider about any new moles or changes in moles, especially if there is a change in a mole's shape or color.  Also tell your health care provider if you have a mole that is larger than the size of a pencil eraser.  Always use sunscreen. Apply sunscreen liberally and repeatedly throughout the day.  Protect yourself by wearing long sleeves, pants, a wide-brimmed hat, and sunglasses whenever you are outside. Heart   disease, diabetes, and high blood pressure  High blood pressure causes heart disease and increases the risk of stroke. High blood pressure is more likely to develop in:  People who have blood pressure in the high end of the normal range (130-139/85-89 mm Hg).  People who are overweight or obese.  People who are African American.  If you are 18-39 years of age, have your blood pressure checked every 3-5 years. If you are 40 years of age or older, have your blood pressure checked every year. You should have your blood pressure measured twice-once when you are at a hospital or clinic, and once when you are not at a hospital or clinic. Record the average of the two measurements. To check your blood pressure when you are not at a hospital or clinic, you can use:  An automated blood pressure machine at a pharmacy.  A home blood pressure monitor.  If you  are between 55 years and 79 years old, ask your health care provider if you should take aspirin to prevent strokes.  Have regular diabetes screenings. This involves taking a blood sample to check your fasting blood sugar level.  If you are at a normal weight and have a low risk for diabetes, have this test once every three years after 78 years of age.  If you are overweight and have a high risk for diabetes, consider being tested at a younger age or more often. Preventing infection Hepatitis B  If you have a higher risk for hepatitis B, you should be screened for this virus. You are considered at high risk for hepatitis B if:  You were born in a country where hepatitis B is common. Ask your health care provider which countries are considered high risk.  Your parents were born in a high-risk country, and you have not been immunized against hepatitis B (hepatitis B vaccine).  You have HIV or AIDS.  You use needles to inject street drugs.  You live with someone who has hepatitis B.  You have had sex with someone who has hepatitis B.  You get hemodialysis treatment.  You take certain medicines for conditions, including cancer, organ transplantation, and autoimmune conditions. Hepatitis C  Blood testing is recommended for:  Everyone born from 1945 through 1965.  Anyone with known risk factors for hepatitis C. Sexually transmitted infections (STIs)  You should be screened for sexually transmitted infections (STIs) including gonorrhea and chlamydia if:  You are sexually active and are younger than 78 years of age.  You are older than 78 years of age and your health care provider tells you that you are at risk for this type of infection.  Your sexual activity has changed since you were last screened and you are at an increased risk for chlamydia or gonorrhea. Ask your health care provider if you are at risk.  If you do not have HIV, but are at risk, it may be recommended that you  take a prescription medicine daily to prevent HIV infection. This is called pre-exposure prophylaxis (PrEP). You are considered at risk if:  You are sexually active and do not regularly use condoms or know the HIV status of your partner(s).  You take drugs by injection.  You are sexually active with a partner who has HIV. Talk with your health care provider about whether you are at high risk of being infected with HIV. If you choose to begin PrEP, you should first be tested for HIV. You should then be   every 3 months for as long as you are taking PrEP. Pregnancy  If you are premenopausal and you may become pregnant, ask your health care provider about preconception counseling.  If you may become pregnant, take 400 to 800 micrograms (mcg) of folic acid every day.  If you want to prevent pregnancy, talk to your health care provider about birth control (contraception). Osteoporosis and menopause  Osteoporosis is a disease in which the bones lose minerals and strength with aging. This can result in serious bone fractures. Your risk for osteoporosis can be identified using a bone density scan.  If you are 64 years of age or older, or if you are at risk for osteoporosis and fractures, ask your health care provider if you should be screened.  Ask your health care provider whether you should take a calcium or vitamin D supplement to lower your risk for osteoporosis.  Menopause may have certain physical symptoms and risks.  Hormone replacement therapy may reduce some of these symptoms and risks. Talk to your health care provider about whether hormone replacement therapy is right for you. Follow these instructions at home:  Schedule regular health, dental, and eye exams.  Stay current with your immunizations.  Do not use any tobacco products including cigarettes, chewing tobacco, or electronic cigarettes.  If you are pregnant, do not drink alcohol.  If you are breastfeeding, limit  how much and how often you drink alcohol.  Limit alcohol intake to no more than 1 drink per day for nonpregnant women. One drink equals 12 ounces of beer, 5 ounces of wine, or 1 ounces of hard liquor.  Do not use street drugs.  Do not share needles.  Ask your health care provider for help if you need support or information about quitting drugs.  Tell your health care provider if you often feel depressed.  Tell your health care provider if you have ever been abused or do not feel safe at home. This information is not intended to replace advice given to you by your health care provider. Make sure you discuss any questions you have with your health care provider. Document Released: 11/25/2010 Document Revised: 10/18/2015 Document Reviewed: 02/13/2015 [employer] Interactive Patient Education  2017 [employer].

## 2016-09-30 NOTE — Progress Notes (Signed)
[name], female   DOB: March 28, 1941, 76 y.o.   MRN: [id number]   Subjective:  I acted as a Education administrator for [name], [place], [place]   [name], female    DOB: 1940/11/30, 76 y.o.   MRN: [id number]  Chief Complaint  Patient presents with  . Medicare Wellness    with RN  . Hypertension    11-monthF/U    Hypertension  This is a chronic problem. The problem is controlled. Pertinent negatives include no blurred vision, chest pain, headaches, malaise/fatigue, palpitations or shortness of breath.    Patient is in today for a Medicare Wellness Visit with the Health Coach to be followed up with the Provider for a 338-monthollow up for hypertension. Patient declines her pneumonia vaccine. Patient has a Hx of HTN, fatigue, unstable again, hyperlipidemia, COPD GOLD II, GERD, osteoporosis. Patient has no additional concerns noted at this time. He feels well. No recent febrile illness or hospitalization. No trouble with ADLs. Denies CP/palp/SOB/HA/congestion/fevers/GI or GU c/o. Taking meds as prescribed Patient Care Team: BlMosie LukesMD as PCP - General (Family Medicine) GoMinna MerrittsMD as Consulting Physician (Cardiology) RoIzora GalaMD as Consulting Physician (Otolaryngology)   Past Medical History:  Diagnosis Date  . AAA (abdominal aortic aneurysm) ([facility]9/2010   AAA (2.8 x 3.0)  moderate RAS (left); 2.7 x 2.7 cm (07/11/05)  . Acute bronchitis 03/20/2013; 2017  . Angina   . Anosmia   . Asthma   . Baker's cyst of knee 01/30/2014  . Basal cell carcinoma    "back and left arm"  . Bradycardia    Metoprolol stopped 08/2011  . CAD (coronary artery disease)    s/CABG (reports IMA and 2 SVGs) back in 1999  Myoview normal 3/10; s/p PCI with DES to PL branch of distal RCA 09/2011; PCI +DES to SVG-RCA, PCI + DES to mid LCx 12/2015  . Cerebrovascular disease 01/2009   carotid u/s  R 0-39%   L 60-79%  . Chronic thoracic back pain   . COPD (chronic  obstructive pulmonary disease) ([place]  . Depression   . Dizziness   . Dysrhythmia    hx of sinus brady  . GERD (gastroesophageal reflux disease)   . Heart murmur   . Herniated lumbar disc without myelopathy   . History of blood transfusion 1999   "when I had the bypass; had a PE"  . Hyperglycemia 10/23/2015  . Hyperlipidemia   . Hypertension   . Hypothyroidism 09/30/2016  . Medicare annual wellness visit, subsequent 07/31/2013  . NSTEMI (non-ST elevated myocardial infarction) ([facility]11/12   Cath showed atretic IMA graft to the LAD, SVG to PD was patent but the continuation of this graft to the PL branch was occluded; there were L to R collaterals; Mid LAD had a 60 to 70% stenosis. She has been treated medically.  Neg Myoview 05/2011  . Osteoarthritis of back   . Osteoporosis   . Pneumonia "several times"  . Pulmonary embolism ([facility]   "after my bypass"  . PVD (peripheral vascular disease) ([facility]  . Shortness of breath   . Squamous carcinoma    "nose"  . Thyroid disease    Hypothyroid  . Unstable angina (HCP[place]5/06/2015    Past Surgical History:  Procedure Laterality Date  . ABDOMINAL AORTIC ANEURYSM REPAIR     pt denies this hx on 01/02/2016  . BASAL CELL CARCINOMA EXCISION    .  CARDIAC CATHETERIZATION N/A 01/02/2016   Procedure: Left Heart Cath and Coronary Angiography;  Surgeon: Wellington Hampshire, MD;  Location: [facility];  Service: Cardiovascular;  Laterality: N/A;  . CARDIAC CATHETERIZATION  1996; 1999  . CARDIAC CATHETERIZATION N/A 06/25/2016   Procedure: Left Heart Cath and Cors/Grafts Angiography;  Surgeon: Wellington Hampshire, MD;  Location: [facility];  Service: Cardiovascular;  Laterality: N/A;  . CAROTID ENDARTERECTOMY Left 01/02/2011  . CATARACT EXTRACTION W/ INTRAOCULAR LENS  IMPLANT, BILATERAL    . CLOSED REDUCTION NASAL FRACTURE  11/2007  . CORONARY ANGIOPLASTY WITH STENT PLACEMENT  10/10/2011   drug eluting  to rc & saphenous  . CORONARY ARTERY BYPASS  GRAFT  1999   "CABG X3"  . DILATION AND CURETTAGE OF UTERUS    . FEMORAL ARTERY STENT Bilateral   . FRACTURE SURGERY    . LEFT HEART CATHETERIZATION WITH CORONARY/GRAFT ANGIOGRAM N/A 04/22/2011   Procedure: LEFT HEART CATHETERIZATION WITH Beatrix Fetters;  Surgeon: Jolaine Artist, MD;  Location[facility];  Service: Cardiovascular;  Laterality: N/A;  . LEFT HEART CATHETERIZATION WITH CORONARY/GRAFT ANGIOGRAM N/A 10/10/2011   Procedure: LEFT HEART CATHETERIZATION WITH Beatrix Fetters;  Surgeon: Peter M Martinique, MD;  [facility];  Service: Cardiovascular;  Laterality: N/A;  . NASAL SINUS SURGERY     twice  . SQUAMOUS CELL CARCINOMA EXCISION    . TUBAL LIGATION      Family History  Problem Relation Age of Onset  . Heart attack Mother 55  . Diabetes Mother   . Colon cancer Father 11  . Lung cancer Father     smoked  . Heart disease Father     angina  . Hypertension Father   . Cancer Father     colon, lung  . Lung cancer Sister     smoked  . Cancer Sister   . Hypothyroidism Sister   . Hypertension Brother   . Heart attack Brother   . Heart disease Brother     stent  . Heart attack Brother     CABG  . Heart disease Brother     CABG with 1 bypass  . Aneurysm Brother     brain  . Alcohol abuse Brother   . Hyperlipidemia Daughter   . Diabetes Maternal Grandmother   . Stroke Maternal Grandmother   . Cirrhosis Maternal Grandfather   . Stroke Paternal Grandmother   . Obesity Daughter   . Obesity Son     Social History   Social History  . Marital status: Married    Spouse name: N/A  . Number of children: 5  . Years of education: N/A   Occupational History  . Retired Retired    former  Marine scientist   Social History Main Topics  . Smoking status: Former Smoker    Packs/day: 0.50    Years: 40.00    Types: Cigarettes    Quit date: 01/19/1993  . Smokeless tobacco: Never Used  . Alcohol use Yes     Comment: 01/02/2016 "might drink a glass of  wine socially q 3-4 months"  . Drug use: No  . Sexual activity: Not Currently    Birth control/ protection: Post-menopausal   Other Topics Concern  . Not on file   Social History Narrative   Married with 5 children    Outpatient Medications Prior to Visit  Medication Sig Dispense Refill  . acetaminophen (TYLENOL) 650 MG CR tablet Take 1,300 mg by mouth 2 (  two) times daily.    Marland Kitchen albuterol (PROVENTIL HFA;VENTOLIN HFA) 108 (90 Base) MCG/ACT inhaler Inhale 2 puffs into the lungs every 6 (six) hours as needed for wheezing. 1 Inhaler 11  . Calcium Carbonate-Vitamin D (CALCIUM + D PO) Take 1 tablet by mouth 2 (two) times daily.    . clopidogrel (PLAVIX) 75 MG tablet Take 1 tablet (75 mg total) by mouth daily. 90 tablet 3  . fluticasone (FLONASE) 50 MCG/ACT nasal spray Place 2 sprays into both nostrils daily. For congestion 48 g 3  . furosemide (LASIX) 20 MG tablet Take 1 tablet (20 mg total) by mouth daily as needed. 90 tablet 3  . isosorbide mononitrate (IMDUR) 60 MG 24 hr tablet Take 1 tablet (60 mg total) by mouth 2 (two) times daily. 180 tablet 3  . Krill Oil 1000 MG CAPS Take 1,000 mg by mouth daily.    Marland Kitchen levothyroxine (SYNTHROID, LEVOTHROID) 25 MCG tablet Take 1 tablet (25 mcg total) by mouth daily before breakfast. 90 tablet 3  . nitroGLYCERIN (NITROSTAT) 0.4 MG SL tablet Place 1 tablet (0.4 mg total) under the tongue every 5 (five) minutes as needed for chest pain. 25 tablet 2  . Polyethyl Glycol-Propyl Glycol (SYSTANE OP) Place 1 drop into both eyes 3 (three) times daily as needed (dry eyes).     . potassium chloride (K-DUR) 10 MEQ tablet Take 1 tablet (10 mEq total) by mouth daily as needed. (Patient taking differently: Take 10-20 mEq by mouth daily as needed (for cramping/lab results.). ) 90 tablet 3  . rosuvastatin (CRESTOR) 40 MG tablet Take 1 tablet (40 mg total) by mouth daily. 90 tablet 3  . sertraline (ZOLOFT) 50 MG tablet TAKE 1 TABLET EVERY DAY **NEED OFFICE VISIT 90 tablet 1    . Spacer/Aero-Holding Chambers DEVI 1 spacer to use prn with HFA device as directed 1 each 0  . SYMBICORT 160-4.5 MCG/ACT inhaler INHALE 2 PUFFS INTO THE LUNGS 2 (TWO) TIMES DAILY. 10.2 Inhaler 4  . tiotropium (SPIRIVA) 18 MCG inhalation capsule Place 1 capsule (18 mcg total) into inhaler and inhale daily. 30 capsule 11  . Vitamin D, Ergocalciferol, (DRISDOL) 50000 units CAPS capsule Take 1 capsule (50,000 Units total) by mouth every 7 (seven) days. 4 capsule 4  . budesonide-formoterol (SYMBICORT) 160-4.5 MCG/ACT inhaler INHALE 2 PUFFS INTO THE LUNGS 2 (TWO) TIMES DAILY. 10.2 Inhaler 11  . HYDROcodone-acetaminophen (NORCO) 10-325 MG tablet Take 1 tablet by mouth every 6 (six) hours as needed. Back pain 20 tablet 0  . metoprolol tartrate (LOPRESSOR) 25 MG tablet Take 0.5 tablets (12.5 mg total) by mouth 2 (two) times daily. 180 tablet 3   No facility-administered medications prior to visit.     Allergies  Allergen Reactions  . Codeine Nausea And Vomiting    REACTION: nausea/vomiting  . Erythromycin Other (See Comments)    REACTION: tongue burns  . Meperidine Hcl Nausea And Vomiting    REACTION: Nausea/vomiting  . Shellfish Allergy Nausea And Vomiting  . Ciprofloxacin Rash    REACTION: rash IV  . Penicillins Rash    Has patient had a PCN reaction causing immediate rash, facial/tongue/throat swelling, SOB or lightheadedness with hypotension:unsure Has patient had a PCN reaction causing severe rash involving mucus membranes or skin necrosis:No Has patient had a PCN reaction that required hospitalization:No Has patient had a PCN reaction occurring within the last 10 years:No If all of the above answers are "NO", then may proceed with Cephalosporin use.   . Tape Rash  Review of Systems  Constitutional: Negative for fever and malaise/fatigue.  HENT: Negative for congestion.   Eyes: Negative for blurred vision.  Respiratory: Negative for cough and shortness of breath.    Cardiovascular: Negative for chest pain, palpitations and leg swelling.  Gastrointestinal: Negative for vomiting.  Musculoskeletal: Negative for back pain.  Skin: Negative for rash.  Neurological: Negative for loss of consciousness and headaches.       Objective:    Physical Exam  Constitutional: She is oriented to person, place, and time. She appears well-developed and well-nourished. No distress.  HENT:  Head: Normocephalic and atraumatic.  Eyes: Conjunctivae are normal.  Neck: Normal range of motion. No thyromegaly present.  Cardiovascular: Normal rate and regular rhythm.   Pulmonary/Chest: Effort normal and breath sounds normal. She has no wheezes.  Abdominal: Soft. Bowel sounds are normal. There is no tenderness.  Musculoskeletal: She exhibits no edema or deformity.  Neurological: She is alert and oriented to person, place, and time.  Skin: Skin is warm and dry. She is not diaphoretic.  Psychiatric: She has a normal mood and affect.    BP (!) 150/76 (BP Location: Right Arm, Patient Position: Sitting, Cuff Size: Normal)   Pulse 62   Ht '5\' 2"'$  (1.575 m)   Wt 140 lb (63.5 kg)   SpO2 97%   BMI 25.61 kg/m  Wt Readings from Last 3 Encounters:  09/30/16 140 lb (63.5 kg)  08/26/16 141 lb 8 oz (64.2 kg)  07/17/16 140 lb (63.5 kg)   BP Readings from Last 3 Encounters:  09/30/16 (!) 150/76  08/26/16 136/84  07/17/16 140/82     Immunization History  Administered Date(s) Administered  . Influenza,inj,Quad PF,36+ Mos 04/15/2016  . Pneumococcal Conjugate-13 01/24/2014  . Pneumococcal Polysaccharide-23 05/25/2002  . Td 05/24/1999  . Tdap 01/24/2014    Health Maintenance  Topic Date Due  . PNA vac Low Risk Adult (2 of 2 - PPSV23) 09/30/2017 (Originally 01/25/2015)  . INFLUENZA VACCINE  12/24/2016  . TETANUS/TDAP  01/25/2024  . DEXA SCAN  Completed    Lab Results  Component Value Date   WBC 6.3 09/30/2016   HGB 14.0 09/30/2016   HCT 42.9 09/30/2016   PLT 181.0  09/30/2016   GLUCOSE 91 09/30/2016   CHOL 142 09/30/2016   TRIG 261.0 (H) 09/30/2016   HDL 34.90 (L) 09/30/2016   LDLDIRECT 74.0 09/30/2016   LDLCALC 183 (H) 03/07/2016   ALT 11 09/30/2016   AST 15 09/30/2016   NA 142 09/30/2016   K 3.5 09/30/2016   CL 104 09/30/2016   CREATININE 0.94 09/30/2016   BUN 15 09/30/2016   CO2 34 (H) 09/30/2016   TSH 2.50 09/30/2016   INR 1.0 06/17/2016   HGBA1C 6.2 09/30/2016    Lab Results  Component Value Date   TSH 2.50 09/30/2016   Lab Results  Component Value Date   WBC 6.3 09/30/2016   HGB 14.0 09/30/2016   HCT 42.9 09/30/2016   MCV 82.1 09/30/2016   PLT 181.0 09/30/2016   Lab Results  Component Value Date   NA 142 09/30/2016   K 3.5 09/30/2016   CO2 34 (H) 09/30/2016   GLUCOSE 91 09/30/2016   BUN 15 09/30/2016   CREATININE 0.94 09/30/2016   BILITOT 0.4 09/30/2016   ALKPHOS 74 09/30/2016   AST 15 09/30/2016   ALT 11 09/30/2016   PROT 6.9 09/30/2016   ALBUMIN 4.2 09/30/2016   CALCIUM 9.7 09/30/2016   ANIONGAP 8 06/25/2016   GFR  61.28 09/30/2016   Lab Results  Component Value Date   CHOL 142 09/30/2016   Lab Results  Component Value Date   HDL 34.90 (L) 09/30/2016   Lab Results  Component Value Date   LDLCALC 183 (H) 03/07/2016   Lab Results  Component Value Date   TRIG 261.0 (H) 09/30/2016   Lab Results  Component Value Date   CHOLHDL 4 09/30/2016   Lab Results  Component Value Date   HGBA1C 6.2 09/30/2016         Assessment & Plan:   Problem List Items Addressed This Visit    Hyperlipidemia    Tolerating statin, encouraged heart healthy diet, avoid trans fats, minimize simple carbs and saturated fats. Increase exercise as tolerated      Relevant Medications   metoprolol tartrate (LOPRESSOR) 25 MG tablet   Other Relevant Orders   Lipid panel (Completed)   HTN (hypertension)    Poorly controlled will alter medications, encouraged DASH diet, minimize caffeine and obtain adequate sleep. Report  concerning symptoms and follow up as directed and as needed. Increase Metoprolol to 25 mg twice daily      Relevant Medications   metoprolol tartrate (LOPRESSOR) 25 MG tablet   Other Relevant Orders   CBC (Completed)   Comprehensive metabolic panel (Completed)   COPD GOLD II    Flared some with pollen will add Singulair.      Relevant Medications   montelukast (SINGULAIR) 10 MG tablet   Osteoporosis    Encouraged to get adequate exercise, calcium and vitamin d intake      Hyperglycemia    hgba1c acceptable, minimize simple carbs. Increase exercise as tolerated.       Relevant Orders   Hemoglobin A1c (Completed)   Stable angina (HCC)    Needed 1 NTG twice last week with exertion and her pain resolved without further intervention, is following with cardiology      Relevant Medications   metoprolol tartrate (LOPRESSOR) 25 MG tablet   Hypothyroidism    On Levothyroxine, continue to monitor      Relevant Medications   metoprolol tartrate (LOPRESSOR) 25 MG tablet   Other Relevant Orders   TSH (Completed)   T4, free (Completed)    Other Visit Diagnoses    Encounter for Medicare annual wellness exam    -  Primary      I have discontinued Ms. Dobransky budesonide-formoterol. I have also changed her metoprolol tartrate. Additionally, I am having her start on montelukast. Lastly, I am having her maintain her Calcium Carbonate-Vitamin D (CALCIUM + D PO), acetaminophen, Polyethyl Glycol-Propyl Glycol (SYSTANE OP), Krill Oil, potassium chloride, Spacer/Aero-Holding Chambers, clopidogrel, nitroGLYCERIN, albuterol, tiotropium, sertraline, rosuvastatin, isosorbide mononitrate, fluticasone, levothyroxine, furosemide, SYMBICORT, Vitamin D (Ergocalciferol), and HYDROcodone-acetaminophen.  Meds ordered this encounter  Medications  . montelukast (SINGULAIR) 10 MG tablet    Sig: Take 1 tablet (10 mg total) by mouth at bedtime as needed.    Dispense:  30 tablet    Refill:  3  .  metoprolol tartrate (LOPRESSOR) 25 MG tablet    Sig: Take 1 tablet (25 mg total) by mouth 2 (two) times daily.    Dispense:  180 tablet    Refill:  3  . HYDROcodone-acetaminophen (NORCO) 10-325 MG tablet    Sig: Take 1 tablet by mouth every 6 (six) hours as needed. Back pain    Dispense:  20 tablet    Refill:  0    CMA served as scribe during this visit. History,  Physical and Plan performed by medical provider. Documentation and orders reviewed and attested to.  [name], MD

## 2016-09-30 NOTE — Assessment & Plan Note (Addendum)
Poorly controlled will alter medications, encouraged DASH diet, minimize caffeine and obtain adequate sleep. Report concerning symptoms and follow up as directed and as needed. Increase Metoprolol to 25 mg twice daily

## 2016-09-30 NOTE — Assessment & Plan Note (Signed)
Tolerating statin, encouraged heart healthy diet, avoid trans fats, minimize simple carbs and saturated fats. Increase exercise as tolerated 

## 2016-09-30 NOTE — Assessment & Plan Note (Signed)
Needed 1 NTG twice last week with exertion and her pain resolved without further intervention, is following with cardiology

## 2016-09-30 NOTE — Assessment & Plan Note (Signed)
Flared some with pollen will add Singulair.

## 2016-09-30 NOTE — Assessment & Plan Note (Signed)
On Levothyroxine, continue to monitor 

## 2016-09-30 NOTE — Progress Notes (Signed)
Pre visit review using our clinic review tool, if applicable. No additional management support is needed unless otherwise documented below in the visit note. 

## 2016-09-30 NOTE — Assessment & Plan Note (Signed)
hgba1c acceptable, minimize simple carbs. Increase exercise as tolerated.  

## 2016-10-06 ENCOUNTER — Encounter: Payer: Self-pay | Admitting: Pulmonary Disease

## 2016-10-06 ENCOUNTER — Ambulatory Visit (INDEPENDENT_AMBULATORY_CARE_PROVIDER_SITE_OTHER): Payer: PPO | Admitting: Pulmonary Disease

## 2016-10-06 VITALS — BP 126/64 | HR 54 | Ht 65.5 in | Wt 146.0 lb

## 2016-10-06 DIAGNOSIS — J4530 Mild persistent asthma, uncomplicated: Secondary | ICD-10-CM | POA: Diagnosis not present

## 2016-10-06 DIAGNOSIS — J449 Chronic obstructive pulmonary disease, unspecified: Secondary | ICD-10-CM

## 2016-10-06 NOTE — Progress Notes (Signed)
PROBLEMS: COPD with significant asthmatic component  FEV1 67% pred 2014  PFTs 07/25/15: Mild-mod obstruction. FEV1 1.22 > 1.31 liters (65 > 69% pred) Chronic cough GERD  INTERVAL: Singulair prescribed last week by primary MD for allergy symptoms with increased shortness of breath. She has not filled this prescription yet  SUBJ: Overall, no major new problems. She has had some increase in her shortness of breath with exertion due to the heavy pollens. She remains active. She remains on Symbicort and Spiriva. She is using her albuterol rescue inhaler 0-2 times per day. She feels that her DOE is slightly improved since last visit. Denies CP, fever, purulent sputum, hemoptysis, LE edema and calf tenderness.  OBJ: Vitals:   10/06/16 1035  BP: 126/64  Pulse: (!) 54  SpO2: 94%  Weight: 146 lb (66.2 kg)  Height: 5' 5.5" (1.664 m)  RA  NAD @ rest HEENT WNL No JVD BS full, isolated wheeze in right upper lobe which cleared with cough Mild bradycardia , regular, no M NABS No C/C/E  DATA:   IMPRESSION: Mild COPD - well controlled Mild asthmatic bronchitis exacerbated by pollens   PLAN: Continue Symbicort and Spiriva  Continue Singulair during heavy pollen months I instructed that she may use the albuterol inhaler, 1-2 inhalations, prior to exertion  Follow up in 6 months or PRN    Merton Border, MD PCCM service Mobile [phone] Pager [phone] 10/06/2016 10:47 AM

## 2016-10-06 NOTE — Patient Instructions (Signed)
Continue Symbicort and Spiriva Continue Singulair during heavy pollen months You may use albuterol inhaler 1-2 puffs prior to exertion or exercise Follow-up in 6 months or as needed

## 2016-10-07 ENCOUNTER — Other Ambulatory Visit: Payer: PPO

## 2016-10-20 ENCOUNTER — Other Ambulatory Visit: Payer: Self-pay | Admitting: Family Medicine

## 2016-10-24 ENCOUNTER — Other Ambulatory Visit: Payer: Self-pay | Admitting: Cardiovascular Disease

## 2016-10-27 ENCOUNTER — Other Ambulatory Visit: Payer: Self-pay | Admitting: Pulmonary Disease

## 2016-12-08 ENCOUNTER — Ambulatory Visit (HOSPITAL_BASED_OUTPATIENT_CLINIC_OR_DEPARTMENT_OTHER)
Admission: RE | Admit: 2016-12-08 | Discharge: 2016-12-08 | Disposition: A | Discharge status: Home / Self Care | Payer: PPO | Source: Ambulatory Visit | Attending: Family Medicine | Admitting: Family Medicine

## 2016-12-08 ENCOUNTER — Other Ambulatory Visit: Payer: Self-pay | Admitting: Family Medicine

## 2016-12-08 ENCOUNTER — Ambulatory Visit (INDEPENDENT_AMBULATORY_CARE_PROVIDER_SITE_OTHER): Payer: PPO | Admitting: Family

## 2016-12-08 ENCOUNTER — Telehealth: Payer: Self-pay | Admitting: Family

## 2016-12-08 ENCOUNTER — Encounter: Payer: Self-pay | Admitting: Family

## 2016-12-08 ENCOUNTER — Ambulatory Visit (HOSPITAL_BASED_OUTPATIENT_CLINIC_OR_DEPARTMENT_OTHER)
Admission: RE | Admit: 2016-12-08 | Discharge: 2016-12-08 | Disposition: A | Discharge status: Home / Self Care | Payer: PPO | Source: Ambulatory Visit | Attending: Family | Admitting: Family

## 2016-12-08 ENCOUNTER — Encounter (HOSPITAL_BASED_OUTPATIENT_CLINIC_OR_DEPARTMENT_OTHER): Payer: Self-pay

## 2016-12-08 VITALS — BP 134/77 | HR 60 | Temp 98.4°F | Resp 16 | Ht 65.5 in | Wt 143.6 lb

## 2016-12-08 DIAGNOSIS — M4312 Spondylolisthesis, cervical region: Secondary | ICD-10-CM | POA: Diagnosis not present

## 2016-12-08 DIAGNOSIS — Z1231 Encounter for screening mammogram for malignant neoplasm of breast: Secondary | ICD-10-CM

## 2016-12-08 DIAGNOSIS — M1288 Other specific arthropathies, not elsewhere classified, other specified site: Secondary | ICD-10-CM | POA: Insufficient documentation

## 2016-12-08 DIAGNOSIS — M542 Cervicalgia: Secondary | ICD-10-CM | POA: Diagnosis not present

## 2016-12-08 DIAGNOSIS — M50322 Other cervical disc degeneration at C5-C6 level: Secondary | ICD-10-CM | POA: Insufficient documentation

## 2016-12-08 NOTE — Telephone Encounter (Signed)
Notified pt and she voices understanding. 

## 2016-12-08 NOTE — Patient Instructions (Addendum)
Apply moist heat twice daily to your left neck. They you can apply a rub such as aspercream or salonpas. Add tylenol 500mg  twice daily scheduled. If this does not help in 1 week you can increase to 1000mg  twice daily. Complete x ray on the first floor of your neck.   Call if symptoms are not improved in 2 weeks.

## 2016-12-08 NOTE — Telephone Encounter (Signed)
Please let pt know that I reviewed her refills. Dr. Candis Musa has been refilling her nitro. This refill request should go to Dr. Candis Musa.

## 2016-12-08 NOTE — Progress Notes (Signed)
Subjective:    [name], female    DOB: April 26, 1941, 76 y.o.   MRN: [id number]  HPI  Andrea [name] is a 76 yr old female who presents today with chief complaint of left sided otalgia. Pain has been present x 3 weeks.  Reports that pain is radiating down behind her left ear into her neck and shoulder. She has used tylenol prn and rare 1/2 tablet of vicodin.  Seems to help.    CAD- she reports that she has follow up with her cardiologist in September. Requesting refill on her nitro. She uses "prn."   Review of Systems See HPI  Past Medical History:  Diagnosis Date  . AAA (abdominal aortic aneurysm) ([place]) 01/2009   AAA (2.8 x 3.0)  moderate RAS (left); 2.7 x 2.7 cm (07/11/05)  . Acute bronchitis 03/20/2013; 2017  . Angina   . Anosmia   . Asthma   . Baker's cyst of knee 01/30/2014  . Basal cell carcinoma    "back and left arm"  . Bradycardia    Metoprolol stopped 08/2011  . CAD (coronary artery disease)    s/CABG (reports IMA and 2 SVGs) back in 1999  Myoview normal 3/10; s/p PCI with DES to PL branch of distal RCA 09/2011; PCI +DES to SVG-RCA, PCI + DES to mid LCx 12/2015  . Cerebrovascular disease 01/2009   carotid u/s  R 0-39%   L 60-79%  . Chronic thoracic back pain   . COPD (chronic obstructive pulmonary disease) ([place])   . Depression   . Dizziness   . Dysrhythmia    hx of sinus brady  . GERD (gastroesophageal reflux disease)   . Heart murmur   . Herniated lumbar disc without myelopathy   . History of blood transfusion 1999   "when I had the bypass; had a PE"  . Hyperglycemia 10/23/2015  . Hyperlipidemia   . Hypertension   . Hypothyroidism 09/30/2016  . Medicare annual wellness visit, subsequent 07/31/2013  . NSTEMI (non-ST elevated myocardial infarction) ([place]) 11/12   Cath showed atretic IMA graft to the LAD, SVG to PD was patent but the continuation of this graft to the PL branch was occluded; there were L to R collaterals; Mid LAD had a 60 to 70% stenosis. She  has been treated medically.  Neg Myoview 05/2011  . Osteoarthritis of back   . Osteoporosis   . Pneumonia "several times"  . Pulmonary embolism ([place]) 1999   "after my bypass"  . PVD (peripheral vascular disease) ([place])   . Shortness of breath   . Squamous carcinoma    "nose"  . Thyroid disease    Hypothyroid  . Unstable angina ([place]) 09/25/2015     Social History   Social History  . Marital status: Married    Spouse name: N/A  . Number of children: 5  . Years of education: N/A   Occupational History  . Retired Retired    former  Marine scientist   Social History Main Topics  . Smoking status: Former Smoker    Packs/day: 0.50    Years: 40.00    Types: Cigarettes    Quit date: 01/19/1993  . Smokeless tobacco: Never Used  . Alcohol use Yes     Comment: 01/02/2016 "might drink a glass of wine socially q 3-4 months"  . Drug use: No  . Sexual activity: Not Currently    Birth control/ protection: Post-menopausal   Other Topics Concern  . Not on file  Social History Narrative   Married with 5 children    Past Surgical History:  Procedure Laterality Date  . ABDOMINAL AORTIC ANEURYSM REPAIR     pt denies this hx on 01/02/2016  . BASAL CELL CARCINOMA EXCISION    . CARDIAC CATHETERIZATION N/A 01/02/2016   Procedure: Left Heart Cath and Coronary Angiography;  Surgeon: Wellington Hampshire, MD;  Location: [facility];  Service: Cardiovascular;  Laterality: N/A;  . CARDIAC CATHETERIZATION  1996; 1999  . CARDIAC CATHETERIZATION N/A 06/25/2016   Procedure: Left Heart Cath and Cors/Grafts Angiography;  Surgeon: Wellington Hampshire, MD;  Location: [facility];  Service: Cardiovascular;  Laterality: N/A;  . CAROTID ENDARTERECTOMY Left 01/02/2011  . CATARACT EXTRACTION W/ INTRAOCULAR LENS  IMPLANT, BILATERAL    . CLOSED REDUCTION NASAL FRACTURE  11/2007  . CORONARY ANGIOPLASTY WITH STENT PLACEMENT  10/10/2011   drug eluting  to rc & saphenous  . CORONARY ARTERY BYPASS GRAFT  1999   "CABG X3"    . DILATION AND CURETTAGE OF UTERUS    . FEMORAL ARTERY STENT Bilateral   . FRACTURE SURGERY    . LEFT HEART CATHETERIZATION WITH CORONARY/GRAFT ANGIOGRAM N/A 04/22/2011   Procedure: LEFT HEART CATHETERIZATION WITH Beatrix Fetters;  Surgeon: Jolaine Artist, MD;  Location[facility];  Service: Cardiovascular;  Laterality: N/A;  . LEFT HEART CATHETERIZATION WITH CORONARY/GRAFT ANGIOGRAM N/A 10/10/2011   Procedure: LEFT HEART CATHETERIZATION WITH Beatrix Fetters;  Surgeon: Peter M Martinique, MD;  Location: [facility] C[facility];  Service: Cardiovascular;  Laterality: N/A;  . NASAL SINUS SURGERY     twice  . SQUAMOUS CELL CARCINOMA EXCISION    . TUBAL LIGATION      Family History  Problem Relation Age of Onset  . Heart attack Mother 82  . Diabetes Mother   . Colon cancer Father 36  . Lung cancer Father        smoked  . Heart disease Father        angina  . Hypertension Father   . Cancer Father        colon, lung  . Lung cancer Sister        smoked  . Cancer Sister   . Hypothyroidism Sister   . Hypertension Brother   . Heart attack Brother   . Heart disease Brother        stent  . Heart attack Brother        CABG  . Heart disease Brother        CABG with 1 bypass  . Aneurysm Brother        brain  . Alcohol abuse Brother   . Hyperlipidemia Daughter   . Diabetes Maternal Grandmother   . Stroke Maternal Grandmother   . Cirrhosis Maternal Grandfather   . Stroke Paternal Grandmother   . Obesity Daughter   . Obesity Son     Allergies  Allergen Reactions  . Codeine Nausea And Vomiting    REACTION: nausea/vomiting  . Erythromycin Other (See Comments)    REACTION: tongue burns  . Meperidine Hcl Nausea And Vomiting    REACTION: Nausea/vomiting  . Shellfish Allergy Nausea And Vomiting  . Ciprofloxacin Rash    REACTION: rash IV  . Penicillins Rash    Has patient had a PCN reaction causing immediate rash, facial/tongue/throat swelling, SOB or lightheadedness with  hypotension:unsure Has patient had a PCN reaction causing severe rash involving mucus membranes or skin necrosis:No Has patient had a PCN  reaction that required hospitalization:No Has patient had a PCN reaction occurring within the last 10 years:No If all of the above answers are "NO", then may proceed with Cephalosporin use.   . Tape Rash    Current Outpatient Prescriptions on File Prior to Visit  Medication Sig Dispense Refill  . acetaminophen (TYLENOL) 650 MG CR tablet Take 1,300 mg by mouth 2 (two) times daily.    Marland Kitchen albuterol (PROVENTIL HFA;VENTOLIN HFA) 108 (90 Base) MCG/ACT inhaler Inhale 2 puffs into the lungs every 6 (six) hours as needed for wheezing. 1 Inhaler 11  . Calcium Carbonate-Vitamin D (CALCIUM + D PO) Take 1 tablet by mouth 2 (two) times daily.    . clopidogrel (PLAVIX) 75 MG tablet Take 1 tablet (75 mg total) by mouth daily. 90 tablet 3  . fluticasone (FLONASE) 50 MCG/ACT nasal spray Place 2 sprays into both nostrils daily. For congestion 48 g 3  . furosemide (LASIX) 20 MG tablet Take 1 tablet (20 mg total) by mouth daily as needed. 90 tablet 3  . HYDROcodone-acetaminophen (NORCO) 10-325 MG tablet Take 1 tablet by mouth every 6 (six) hours as needed. Back pain 20 tablet 0  . isosorbide mononitrate (IMDUR) 60 MG 24 hr tablet Take 1 tablet (60 mg total) by mouth 2 (two) times daily. 180 tablet 3  . Krill Oil 1000 MG CAPS Take 1,000 mg by mouth daily.    Marland Kitchen levothyroxine (SYNTHROID, LEVOTHROID) 25 MCG tablet Take 1 tablet (25 mcg total) by mouth daily before breakfast. 90 tablet 3  . metoprolol tartrate (LOPRESSOR) 25 MG tablet Take 1 tablet (25 mg total) by mouth 2 (two) times daily. 180 tablet 3  . montelukast (SINGULAIR) 10 MG tablet Take 1 tablet (10 mg total) by mouth at bedtime as needed. 30 tablet 3  . nitroGLYCERIN (NITROSTAT) 0.4 MG SL tablet PLACE 1 TABLET (0.4 MG TOTAL) UNDER THE TONGUE EVERY 5 (FIVE) MINUTES AS NEEDED FOR CHEST PAIN. 25 tablet 1  . Polyethyl  Glycol-Propyl Glycol (SYSTANE OP) Place 1 drop into both eyes 3 (three) times daily as needed (dry eyes).     . potassium chloride (K-DUR) 10 MEQ tablet Take 1 tablet (10 mEq total) by mouth daily as needed. (Patient taking differently: Take 10-20 mEq by mouth daily as needed (for cramping/lab results.). ) 90 tablet 3  . PROAIR HFA 108 (90 Base) MCG/ACT inhaler INHALE 2 PUFFS INTO THE LUNGS EVERY 6 (SIX) HOURS AS NEEDED FOR WHEEZING. 6.7 Inhaler 1  . rosuvastatin (CRESTOR) 40 MG tablet Take 1 tablet (40 mg total) by mouth daily. 90 tablet 3  . sertraline (ZOLOFT) 50 MG tablet TAKE 1 TABLET EVERY DAY 90 tablet 1  . Spacer/Aero-Holding Chambers DEVI 1 spacer to use prn with HFA device as directed 1 each 0  . SYMBICORT 160-4.5 MCG/ACT inhaler INHALE 2 PUFFS INTO THE LUNGS 2 (TWO) TIMES DAILY. 10.2 Inhaler 4  . tiotropium (SPIRIVA) 18 MCG inhalation capsule Place 1 capsule (18 mcg total) into inhaler and inhale daily. 30 capsule 11  . Vitamin D, Ergocalciferol, (DRISDOL) 50000 units CAPS capsule Take 1 capsule (50,000 Units total) by mouth every 7 (seven) days. 4 capsule 4   No current facility-administered medications on file prior to visit.     BP 134/77 (BP Location: Left Arm, Cuff Size: Normal)   Pulse 60   Temp 98.4 F (36.9 C) (Oral)   Resp 16   Ht 5' 5.5" (1.664 m)   Wt 143 lb 9.6 oz (65.1 kg)  SpO2 99%   BMI 23.53 kg/m       Objective:   Physical Exam  Constitutional: She is oriented to person, place, and time. She appears well-developed and well-nourished.  HENT:  Right Ear: Tympanic membrane and ear canal normal.  Left Ear: Tympanic membrane and ear canal normal.  Cardiovascular: Normal rate, regular rhythm and normal heart sounds.   No murmur heard. Pulmonary/Chest: Effort normal and breath sounds normal. No respiratory distress. She has no wheezes.  Musculoskeletal:  + tenderness to palpation left lower/lateral neck.  Increased pain with use of LUE  Neurological: She is  alert and oriented to person, place, and time.  Bilateral UE strength is 5/5  Psychiatric: She has a normal mood and affect. Her behavior is normal. Judgment and thought content normal.          Assessment & Plan:  Musculoskeletal neck pain- need to avoid nsaids since she is on plavix.  Reviewed case with Dr. Charlett Blake. She recommended scheduled tylenol bid (pt states she is already doing this) as well as topic rub such as salon pas or aspercream, moist heat bid. Will obtain x ray of the cspine. If symptoms worsen or fail to improve will consider sports med versus PT referral.  Pt in agreement with plan.

## 2016-12-16 ENCOUNTER — Encounter: Payer: Self-pay | Admitting: *Deleted

## 2016-12-16 ENCOUNTER — Other Ambulatory Visit: Payer: Self-pay

## 2016-12-16 DIAGNOSIS — Z006 Encounter for examination for normal comparison and control in clinical research program: Secondary | ICD-10-CM

## 2016-12-16 NOTE — Progress Notes (Addendum)
LEADERS FREE II month 12 follow up completed. Patient states her health has been unchanged since she last saw her PCP. She takes NTG. For her stable angina (wth rest and activity) (1-2/week). She states her cardiologist and her pcp have be informed. The remaining research required visit are phone call follow up visits at year 2 & 3.  EKG obtained:

## 2016-12-31 ENCOUNTER — Other Ambulatory Visit: Payer: Self-pay | Admitting: Family Medicine

## 2017-02-04 ENCOUNTER — Other Ambulatory Visit: Payer: Self-pay | Admitting: Pulmonary Disease

## 2017-02-05 DIAGNOSIS — K219 Gastro-esophageal reflux disease without esophagitis: Secondary | ICD-10-CM | POA: Diagnosis not present

## 2017-02-06 ENCOUNTER — Other Ambulatory Visit: Payer: Self-pay | Admitting: Cardiology

## 2017-02-08 NOTE — Progress Notes (Signed)
Cardiology Office Note  Date:  02/10/2017   ID:  Kaityln Kallstrom, DOB 08-04-1940, MRN [id number]  PCP:  Mosie Lukes, MD   Chief Complaint  Patient presents with  . other    88mo f/u. Pt c/o occ. chest pain/sob. Reviewed meds with pt verbally.    HPI:  Ms. Wimes is a pleasant 76 year old woman with history of  chronic bradycardia,  spinal stenosis,  peripheral vascular disease,  stents to her lower extremities status post CEA on the left, 40-59% residual disease on the right, 10/17 hyperlipidemia,  COPD, long smoking history for at least 30 years coronary artery disease and bypass surgery in 1999, at [facility] in [place] stent placement to distal RCA may 2013, In August 2017 she had 2 stents placed to vein graft to the RCA, also with stents to the mid left circumflex with resolution of her anginal symptoms Shepresents for routine followup Of her coronary artery disease .  She is part of the bio freedom study  In follow-up today she reports that she is doing well Concerned that at home her diastolic pressure is elevated typically high 80s to 90 She preferred to have blood pressure lower Denies any chest pain symptoms concerning for angina Denies any claudication type symptoms No shortness of breath on exertion, no leg swelling, no PND orthopnea Tolerating high-dose statin  EKG personally reviewed by myself on todays visit Shows normal sinus rhythm rate 60 bpm T-wave inversions V3 through V6, 2, 3, aVF  Other past medical history reviewed cardiac catheterization 06/25/2016 occluded vein graft to the RCA, 60% left ostial subclavian vessel disease Medical management   Echo 08/11/2016 Normal EF, moderately elevated RVSP 57 mm Hg, moderate to severe TR  Carotid 10/17 40 to 59% disease on the right, <39% on the left  Denies any significant anginal symptoms previously stopped her Crestor, cholesterol from 144 up to 242 in May 30. 2017 Now back on crestor LDL 64, total  chol 126  Cardiac catheterization results 1. Significant underlying three-vessel coronary artery disease with patent LIMA to LAD. The SVG to the RCA is now occluded proximally with left-to-right collaterals. The left circumflex stent is patent with no significant restenosis. 2. Mildly reduced LV systolic function with an EF of 50-55% and inferior wall hypokinesis. Mildly elevated left ventricular end-diastolic pressure. 3. Aortic arch angiogram was done to evaluate the stenosis in the left subclavian artery. It showed 60% heavily calcified stenosis in the ostium of the left subclavian artery. There was about 09-38 mm systolic gradient across the stenosis.   cardiac catheterization 01/02/2016 She had 2 stents placed to the vein graft to the RCA, also angioplasty with stent placement to the mid left circumflex Following intervention she feels much better, no chest pain, shortness of breath improved Previously was taking nitroglycerin for anginal symptoms, now no longer needing nitroglycerin Was taking NTG 5 x in one day  She has a history of stents to her lower extremities, these have not been visualized in many years  Cath 01/02/16 Significant underlying three-vessel coronary artery disease. Patent LIMA to LAD. However, there is retrograde flow in the graft itself likely due to no obstructive disease down the native LAD. The native LAD has diffuse 60-70% calcified disease in the proximal and midsegment. Severe new disease in SVG to RCA. Collaterals were noted from left-to-right. Worsening disease in the native left circumflex which does not have a patent bypass.  2. Moderate left subclavian stenosis which was not significant by gradients. 3.  Normal left ventricular end-diastolic pressure. Left ventricular angiography was not performed. 4. Successful angioplasty and drug-eluting stent placement to the SVG to RCA with 2 bio freedom stent placement. Successful angioplasty and stent placement to  the mid left circumflex with 1 I freedom stent.  The SVG to RCA is an old graft with new areas of disease. I think this graft is high risk for closure in spite of intervention today. The patient was enrolled in the bio freedom study and thus dual antiplatelet therapy is optional after one month. However, I recommend continuing Plavix indefinitely. The patient did have significant chest pain during SVG to RCA PCI with ST changes. By the end of the procedure, her chest pain resolved completely. However, mild ST elevation persisted possibly due to microvascular injury.   catheterization November 2012 Atretic LIMA to the LAD,  She has saphenous vein graft sequential to PDA and PL loss of distal limb of vein graft to the PDA and PL with left-to-right collaterals 40-50% proximal to mid LAD with focal 60-70% mid LAD 2 large OM's, 30% mid OM1, 40-50% ostial Ejection fraction 55% with inferobasal akinesis   PMH:   has a past medical history of AAA (abdominal aortic aneurysm) ([place]) (01/2009); Acute bronchitis (03/20/2013; 2017); Angina; Anosmia; Asthma; Baker's cyst of knee (01/30/2014); Basal cell carcinoma; Bradycardia; CAD (coronary artery disease); Cerebrovascular disease (01/2009); Chronic thoracic back pain; COPD (chronic obstructive pulmonary disease) ([place]); Depression; Dizziness; Dysrhythmia; GERD (gastroesophageal reflux disease); Heart murmur; Herniated lumbar disc without myelopathy; History of blood transfusion (1999); Hyperglycemia (10/23/2015); Hyperlipidemia; Hypertension; Hypothyroidism (09/30/2016); Medicare annual wellness visit, subsequent (07/31/2013); NSTEMI (non-ST elevated myocardial infarction) ([place]) (11/12); Osteoarthritis of back; Osteoporosis; Pneumonia ("several times"); Pulmonary embolism ([place]) (1999); PVD (peripheral vascular disease) ([place]); Shortness of breath; Squamous carcinoma; Thyroid disease; and Unstable angina ([place]) (09/25/2015).  PSH:    Past Surgical History:  Procedure  Laterality Date  . ABDOMINAL AORTIC ANEURYSM REPAIR     pt denies this hx on 01/02/2016  . BASAL CELL CARCINOMA EXCISION    . CARDIAC CATHETERIZATION N/A 01/02/2016   Procedure: Left Heart Cath and Coronary Angiography;  Surgeon: Wellington Hampshire, MD;  Location: [facility];  Service: Cardiovascular;  Laterality: N/A;  . CARDIAC CATHETERIZATION  1996; 1999  . CARDIAC CATHETERIZATION N/A 06/25/2016   Procedure: Left Heart Cath and Cors/Grafts Angiography;  Surgeon: Wellington Hampshire, MD;  Location: [facility];  Service: Cardiovascular;  Laterality: N/A;  . CAROTID ENDARTERECTOMY Left 01/02/2011  . CATARACT EXTRACTION W/ INTRAOCULAR LENS  IMPLANT, BILATERAL    . CLOSED REDUCTION NASAL FRACTURE  11/2007  . CORONARY ANGIOPLASTY WITH STENT PLACEMENT  10/10/2011   drug eluting  to rc & saphenous  . CORONARY ARTERY BYPASS GRAFT  1999   "CABG X3"  . DILATION AND CURETTAGE OF UTERUS    . FEMORAL ARTERY STENT Bilateral   . FRACTURE SURGERY    . LEFT HEART CATHETERIZATION WITH CORONARY/GRAFT ANGIOGRAM N/A 04/22/2011   Procedure: LEFT HEART CATHETERIZATION WITH Beatrix Fetters;  Surgeon: Jolaine Artist, MD;  Location: [facility];  Service: Cardiovascular;  Laterality: N/A;  . LEFT HEART CATHETERIZATION WITH CORONARY/GRAFT ANGIOGRAM N/A 10/10/2011   Procedure: LEFT HEART CATHETERIZATION WITH Beatrix Fetters;  Surgeon: Peter M Martinique, MD;  Location: [facility] CATH LAB;  Service: Cardiovascular;  Laterality: N/A;  . NASAL SINUS SURGERY     twice  . SQUAMOUS CELL CARCINOMA EXCISION    . TUBAL LIGATION      Current Outpatient Prescriptions  Medication Sig Dispense Refill  . acetaminophen (TYLENOL) 650 MG CR tablet Take 1,300 mg by mouth 2 (two) times daily.    Marland Kitchen albuterol (PROVENTIL HFA;VENTOLIN HFA) 108 (90 Base) MCG/ACT inhaler Inhale 2 puffs into the lungs every 6 (six) hours as needed for wheezing. 1 Inhaler 11  . Calcium Carbonate-Vitamin D (CALCIUM + D PO) Take 1 tablet by  mouth 2 (two) times daily.    . clopidogrel (PLAVIX) 75 MG tablet TAKE 1 TABLET (75 MG TOTAL) BY MOUTH DAILY. 90 tablet 0  . fluticasone (FLONASE) 50 MCG/ACT nasal spray Place 2 sprays into both nostrils daily. For congestion 48 g 3  . furosemide (LASIX) 20 MG tablet Take 1 tablet (20 mg total) by mouth daily as needed. 90 tablet 3  . HYDROcodone-acetaminophen (NORCO) 10-325 MG tablet Take 1 tablet by mouth every 6 (six) hours as needed. Back pain 20 tablet 0  . isosorbide mononitrate (IMDUR) 60 MG 24 hr tablet Take 1 tablet (60 mg total) by mouth 2 (two) times daily. 180 tablet 3  . Krill Oil 1000 MG CAPS Take 1,000 mg by mouth daily.    Marland Kitchen levothyroxine (SYNTHROID, LEVOTHROID) 25 MCG tablet Take 1 tablet (25 mcg total) by mouth daily before breakfast. 90 tablet 3  . metoprolol tartrate (LOPRESSOR) 25 MG tablet Take 1 tablet (25 mg total) by mouth 2 (two) times daily. 180 tablet 3  . nitroGLYCERIN (NITROSTAT) 0.4 MG SL tablet Place 1 tablet (0.4 mg total) under the tongue every 5 (five) minutes as needed for chest pain. 25 tablet 3  . Polyethyl Glycol-Propyl Glycol (SYSTANE OP) Place 1 drop into both eyes 3 (three) times daily as needed (dry eyes).     . potassium chloride (K-DUR) 10 MEQ tablet Take 1 tablet (10 mEq total) by mouth daily as needed. (Patient taking differently: Take 10-20 mEq by mouth daily as needed (for cramping/lab results.). ) 90 tablet 3  . PROAIR HFA 108 (90 Base) MCG/ACT inhaler INHALE 2 PUFFS INTO THE LUNGS EVERY 6 (SIX) HOURS AS NEEDED FOR WHEEZING. 6.7 Inhaler 1  . rosuvastatin (CRESTOR) 40 MG tablet Take 1 tablet (40 mg total) by mouth daily. 90 tablet 3  . sertraline (ZOLOFT) 50 MG tablet TAKE 1 TABLET EVERY DAY 90 tablet 1  . Spacer/Aero-Holding Chambers DEVI 1 spacer to use prn with HFA device as directed 1 each 0  . SPIRIVA HANDIHALER 18 MCG inhalation capsule PLACE 1 CAPSULE (18 MCG TOTAL) INTO INHALER AND INHALE DAILY. 30 capsule 2  . SYMBICORT 160-4.5 MCG/ACT  inhaler INHALE 2 PUFFS INTO THE LUNGS 2 (TWO) TIMES DAILY. 10.2 Inhaler 4  . Vitamin D, Ergocalciferol, (DRISDOL) 50000 units CAPS capsule TAKE 1 CAPSULE (50,000 UNITS TOTAL) BY MOUTH EVERY 7 (SEVEN) DAYS. 4 capsule 0  . losartan (COZAAR) 50 MG tablet Take 1 tablet (50 mg total) by mouth daily. 30 tablet 11   No current facility-administered medications for this visit.      Allergies:   Codeine; Erythromycin; Meperidine hcl; Shellfish allergy; Ciprofloxacin; Penicillins; and Tape   Social History:  The patient  reports that she quit smoking about 24 years ago. Her smoking use included Cigarettes. She has a 20.00 pack-year smoking history. She has never used smokeless tobacco. She reports that she drinks alcohol. She reports that she does not use drugs.   Family History:   family history includes Alcohol abuse in her brother; Aneurysm in her brother; Cancer in her father and sister; Cirrhosis in her maternal grandfather; Colon  cancer (age of onset: 19) in her father; Diabetes in her maternal grandmother and mother; Heart attack in her brother and brother; Heart attack (age of onset: 31) in her mother; Heart disease in her brother, brother, and father; Hyperlipidemia in her daughter; Hypertension in her brother and father; Hypothyroidism in her sister; Lung cancer in her father and sister; Obesity in her daughter and son; Stroke in her maternal grandmother and paternal grandmother.    Review of Systems: Review of Systems  Constitutional: Negative.   Respiratory: Positive for shortness of breath.   Cardiovascular: Negative.   Gastrointestinal: Negative.   Musculoskeletal: Negative.   Neurological: Negative.   Psychiatric/Behavioral: Negative.   All other systems reviewed and are negative.    PHYSICAL EXAM: VS:  BP 134/90 (BP Location: Left Arm, Patient Position: Sitting, Cuff Size: Normal)   Pulse 60   Ht 5\' 2"  (1.575 m)   Wt 144 lb 4 oz (65.4 kg)   BMI 26.38 kg/m  , BMI Body mass  index is 26.38 kg/m. GEN: Well nourished, well developed, in no acute distress  HEENT: normal  Neck: no JVD, carotid bruits, or masses Cardiac: RRR; no murmurs, rubs, or gallops,no edema  Respiratory:  Moderately decreased breath sounds throughout,  normal work of breathing GI: soft, nontender, nondistended, + BS MS: no deformity or atrophy  Skin: warm and dry, no rash Neuro:  Strength and sensation are intact Psych: euthymic mood, full affect    Recent Labs: 07/03/2016: Magnesium 1.8 09/30/2016: ALT 11; BUN 15; Creatinine, Ser 0.94; Hemoglobin 14.0; Platelets 181.0; Potassium 3.5; Sodium 142; TSH 2.50    Lipid Panel Lab Results  Component Value Date   CHOL 142 09/30/2016   HDL 34.90 (L) 09/30/2016   LDLCALC 183 (H) 03/07/2016   TRIG 261.0 (H) 09/30/2016      Wt Readings from Last 3 Encounters:  02/10/17 144 lb 4 oz (65.4 kg)  12/08/16 143 lb 9.6 oz (65.1 kg)  10/06/16 146 lb (66.2 kg)       ASSESSMENT AND PLAN:  Mixed hyperlipidemia Cholesterol is at goal on the current lipid regimen. No changes to the medications were made.  Essential hypertension Discussed various treatment options for her blood pressure Recommended she start losartan 25 mg daily If blood pressure continues to run high would increase losartan up to 50 mg daily  Coronary artery disease involving coronary bypass graft of native heart with angina pectoris [facility]) Denies any anginal symptoms on exertion Previous cardiac catheterization results discussed with her  Bilateral carotid artery stenosis Endarterectomy on the left, 40-50% disease on the right Stressed importance of aggressive cholesterol control  Coronary artery disease of native artery of native heart with stable angina pectoris (HCC) Currently with no symptoms of angina. No further workup at this time. Continue current medication regimen. Currently stable  Pulmonary HTN underlying lung disease, 30 years of smoking She is taking Lasix  several days per week Reports shortness of breath is stable  Non-rheumatic tricuspid valve insufficiency Moderate to severe TR on echo likely from elevated right heart pressures  Chronic fatigue Recommended a regular exercise program for conditioning  Disposition:   F/U  12 months   Total encounter time more than 25 minutes  Greater than 50% was spent in counseling and coordination of care with the patient    Orders Placed This Encounter  Procedures  . EKG 12-Lead     Signed, Esmond Plants, M.D., Ph.D. 02/10/2017  [place], [place]

## 2017-02-10 ENCOUNTER — Ambulatory Visit (INDEPENDENT_AMBULATORY_CARE_PROVIDER_SITE_OTHER): Payer: PPO | Admitting: Cardiovascular Disease

## 2017-02-10 ENCOUNTER — Encounter: Payer: Self-pay | Admitting: Cardiovascular Disease

## 2017-02-10 VITALS — BP 134/90 | HR 60 | Ht 62.0 in | Wt 144.2 lb

## 2017-02-10 DIAGNOSIS — I10 Essential (primary) hypertension: Secondary | ICD-10-CM | POA: Diagnosis not present

## 2017-02-10 DIAGNOSIS — I25709 Atherosclerosis of coronary artery bypass graft(s), unspecified, with unspecified angina pectoris: Secondary | ICD-10-CM | POA: Diagnosis not present

## 2017-02-10 DIAGNOSIS — I6523 Occlusion and stenosis of bilateral carotid arteries: Secondary | ICD-10-CM | POA: Diagnosis not present

## 2017-02-10 DIAGNOSIS — E782 Mixed hyperlipidemia: Secondary | ICD-10-CM | POA: Diagnosis not present

## 2017-02-10 DIAGNOSIS — I701 Atherosclerosis of renal artery: Secondary | ICD-10-CM

## 2017-02-10 DIAGNOSIS — Z951 Presence of aortocoronary bypass graft: Secondary | ICD-10-CM | POA: Diagnosis not present

## 2017-02-10 MED ORDER — NITROGLYCERIN 0.4 MG SL SUBL: 0.4000 mg | SUBLINGUAL_TABLET | SUBLINGUAL | 3 refills | Status: DC | PRN

## 2017-02-10 MED ORDER — LOSARTAN POTASSIUM 50 MG PO TABS: 50.0000 mg | ORAL_TABLET | Freq: Every day | ORAL | 11 refills | Status: DC

## 2017-02-10 NOTE — Patient Instructions (Addendum)
Medication Instructions:   For high blood pressure, Please start losartan 1/2 pill once a day  If after 2 weeks, Blood pressure continues to run high, Increase up to a full pill  Labwork:  No new labs needed  Testing/Procedures:  No further testing at this time   Follow-Up: It was a pleasure seeing you in the office today. Please call us if you have new issues that need to be addressed before your next appt.  [phone]  Your physician wants you to follow-up in: 12 months.  You will receive a reminder letter in the mail two months in advance. If you don't receive a letter, please call our office to schedule the follow-up appointment.  If you need a refill on your cardiac medications before your next appointment, please call your pharmacy.

## 2017-02-12 ENCOUNTER — Other Ambulatory Visit: Payer: Self-pay | Admitting: Cardiology

## 2017-02-16 ENCOUNTER — Other Ambulatory Visit: Payer: Self-pay | Admitting: Family Medicine

## 2017-02-17 ENCOUNTER — Other Ambulatory Visit: Payer: Self-pay | Admitting: Family Medicine

## 2017-02-17 NOTE — Telephone Encounter (Signed)
Not on current medication list/thx dmf

## 2017-02-24 ENCOUNTER — Telehealth: Payer: Self-pay | Admitting: Pulmonary Disease

## 2017-02-24 NOTE — Telephone Encounter (Signed)
Patient is in the donut hole and cannot afford inhalers Spiriva and Symbicort please call to discuss options

## 2017-02-25 NOTE — Telephone Encounter (Signed)
Gave pt information to call and start pt assistance forms for both inhalers. Informed her that if there is something that needs to be faxed to the companies to drop them off at the front desk and we can fax them in for her. Pt verbalized understanding. Nothing further needed.

## 2017-02-27 ENCOUNTER — Other Ambulatory Visit: Payer: Self-pay | Admitting: Pulmonary Disease

## 2017-03-04 ENCOUNTER — Telehealth: Payer: Self-pay | Admitting: Pulmonary Disease

## 2017-03-04 NOTE — Telephone Encounter (Signed)
Pt calling stating she just got off the phone with [employer] They don't have any more coupons  She is in donut hole  She was advised by them that we may be able to send in a order for respimat inhaler or something else that may be given to her.  Please advise.

## 2017-03-04 NOTE — Telephone Encounter (Signed)
Called patient back and let her know Dr. Alva Garnet is out of clinic until Friday. We will forward message and await response.

## 2017-03-04 NOTE — Telephone Encounter (Signed)
Please advise:     Pt calling stating she just got off the phone with [employer] They don't have any more coupons  She is in donut hole  She was advised by them that we may be able to send in a order for respimat inhaler or something else that may be given to her.  Please advise.         Documentation     Kemari Mares [phone]  Caleen Essex M 2 hours ago (12:51 PM)

## 2017-03-05 NOTE — Telephone Encounter (Signed)
If I am understanding this correctly, she can be switched to the respimat inhaler and continue to get assistance. If that is correct, then please let me know and I will change her Rx  Andrea [name]

## 2017-03-06 ENCOUNTER — Other Ambulatory Visit: Payer: Self-pay | Admitting: Pulmonary Disease

## 2017-03-06 MED ORDER — TIOTROPIUM BROMIDE MONOHYDRATE 1.25 MCG/ACT IN AERS: 2.0000 | INHALATION_SPRAY | Freq: Every day | RESPIRATORY_TRACT | 11 refills | Status: DC

## 2017-03-06 NOTE — Telephone Encounter (Signed)
Done

## 2017-03-06 NOTE — Telephone Encounter (Signed)
Called patient and Yes she will continue to get assistance with Respimat. She would like Rx sent to [facility] on [address]

## 2017-03-16 DIAGNOSIS — Z961 Presence of intraocular lens: Secondary | ICD-10-CM | POA: Diagnosis not present

## 2017-05-12 ENCOUNTER — Other Ambulatory Visit: Payer: Self-pay | Admitting: Cardiovascular Disease

## 2017-06-03 DIAGNOSIS — L570 Actinic keratosis: Secondary | ICD-10-CM | POA: Diagnosis not present

## 2017-06-03 DIAGNOSIS — D2261 Melanocytic nevi of right upper limb, including shoulder: Secondary | ICD-10-CM | POA: Diagnosis not present

## 2017-06-03 DIAGNOSIS — D2272 Melanocytic nevi of left lower limb, including hip: Secondary | ICD-10-CM | POA: Diagnosis not present

## 2017-06-03 DIAGNOSIS — Z85828 Personal history of other malignant neoplasm of skin: Secondary | ICD-10-CM | POA: Diagnosis not present

## 2017-06-03 DIAGNOSIS — X32XXXA Exposure to sunlight, initial encounter: Secondary | ICD-10-CM | POA: Diagnosis not present

## 2017-06-03 DIAGNOSIS — D225 Melanocytic nevi of trunk: Secondary | ICD-10-CM | POA: Diagnosis not present

## 2017-06-05 ENCOUNTER — Encounter: Payer: Self-pay | Admitting: Pulmonary Disease

## 2017-06-05 ENCOUNTER — Ambulatory Visit: Payer: PPO | Admitting: Pulmonary Disease

## 2017-06-05 VITALS — BP 118/68 | HR 62 | Ht 62.0 in | Wt 146.0 lb

## 2017-06-05 DIAGNOSIS — K219 Gastro-esophageal reflux disease without esophagitis: Secondary | ICD-10-CM | POA: Diagnosis not present

## 2017-06-05 DIAGNOSIS — R053 Chronic cough: Secondary | ICD-10-CM

## 2017-06-05 DIAGNOSIS — J310 Chronic rhinitis: Secondary | ICD-10-CM | POA: Diagnosis not present

## 2017-06-05 DIAGNOSIS — J449 Chronic obstructive pulmonary disease, unspecified: Secondary | ICD-10-CM | POA: Diagnosis not present

## 2017-06-05 DIAGNOSIS — R05 Cough: Secondary | ICD-10-CM

## 2017-06-05 MED ORDER — FAMOTIDINE 20 MG PO TABS: 20.0000 mg | ORAL_TABLET | Freq: Every day | ORAL | 5 refills | Status: DC

## 2017-06-05 NOTE — Progress Notes (Signed)
PROBLEMS: COPD with significant asthmatic component  FEV1 67% pred 2014  PFTs 07/25/15: Mild-mod obstruction. FEV1 1.22 > 1.31 liters (65 > 69% pred) Chronic cough GERD  INTERVAL: No major events  SUBJ: Last seen in May 2018.  This is a routine reevaluation.  However, she reports 3-4 weeks of increased shortness of breath rescue inhaler use.  He is presently using albuterol 2-3 times per day.  She describes a "throat tickle" cough productive of clear mucus.  Her symptoms are made worse in cold weather.  She has nocturnal cough.  She also describes intermittent heartburn symptoms which responds to Tums. Denies CP, fever, purulent sputum, hemoptysis, LE edema and calf tenderness.  OBJ: Vitals:   06/05/17 1046 06/05/17 1047  BP:  118/68  Pulse:  62  SpO2:  90%  Weight: 66.2 kg (146 lb)   Height: 5\' 2"  (1.575 m)   RA  NAD  HEENT: Bilateral rhinitis No JVD Mildly diminished breath sounds without wheezes or other adventitious sounds Regular, no M NABS No C/C/E  DATA: No recent CXR  IMPRESSION: Moderate COPD Chronic cough Chronic rhinitis Suspected GERD   PLAN: Continue Symbicort and Spiriva as maintenance inhalers Continue albuterol inhaler as needed for cough and shortness of breath Continue Flonase inhaler as previously prescribed Begin Pepcid (famotidine) -40 mg taken at dinnertime for the first 5 days, then 20 mg each day taken at dinnertime Follow-up in 5-6 weeks for a quick recheck    Merton Border, MD PCCM service Mobile [phone] Pager [phone] 06/05/2017 10:49 AM

## 2017-06-05 NOTE — Patient Instructions (Signed)
Continue Symbicort and Spiriva as maintenance inhalers Continue albuterol inhaler as needed for cough and shortness of breath Continue Flonase inhaler as previously prescribed Begin Pepcid (famotidine) -40 mg taken at dinnertime for the first 5 days, then 20 mg each day taken at dinnertime Follow-up in 5-6 weeks for a quick recheck

## 2017-06-12 ENCOUNTER — Other Ambulatory Visit: Payer: Self-pay | Admitting: *Deleted

## 2017-06-12 DIAGNOSIS — I6523 Occlusion and stenosis of bilateral carotid arteries: Secondary | ICD-10-CM

## 2017-06-21 ENCOUNTER — Other Ambulatory Visit: Payer: Self-pay | Admitting: Family Medicine

## 2017-07-08 ENCOUNTER — Ambulatory Visit (INDEPENDENT_AMBULATORY_CARE_PROVIDER_SITE_OTHER): Payer: PPO

## 2017-07-08 DIAGNOSIS — I6523 Occlusion and stenosis of bilateral carotid arteries: Secondary | ICD-10-CM | POA: Diagnosis not present

## 2017-07-13 ENCOUNTER — Other Ambulatory Visit: Payer: Self-pay | Admitting: *Deleted

## 2017-07-13 DIAGNOSIS — I6523 Occlusion and stenosis of bilateral carotid arteries: Secondary | ICD-10-CM

## 2017-07-17 ENCOUNTER — Encounter: Payer: Self-pay | Admitting: Pulmonary Disease

## 2017-07-17 ENCOUNTER — Ambulatory Visit: Payer: PPO | Admitting: Pulmonary Disease

## 2017-07-17 VITALS — BP 94/60 | HR 54 | Ht 62.0 in | Wt 147.0 lb

## 2017-07-17 DIAGNOSIS — J449 Chronic obstructive pulmonary disease, unspecified: Secondary | ICD-10-CM

## 2017-07-17 DIAGNOSIS — K219 Gastro-esophageal reflux disease without esophagitis: Secondary | ICD-10-CM

## 2017-07-17 DIAGNOSIS — R05 Cough: Secondary | ICD-10-CM

## 2017-07-17 DIAGNOSIS — R059 Cough, unspecified: Secondary | ICD-10-CM

## 2017-07-17 NOTE — Progress Notes (Signed)
Ref to gi

## 2017-07-17 NOTE — Patient Instructions (Signed)
Continue Symbicort and Spiriva Continue albuterol as needed Referral made to gastroenterology for severe acid reflux Follow-up in 6 months or sooner as needed

## 2017-07-17 NOTE — Progress Notes (Signed)
  PROBLEMS: COPD with significant asthmatic component  FEV1 67% pred 2014  PFTs 07/25/15: Mild-mod obstruction. FEV1 1.22 > 1.31 liters (65 > 69% pred) Chronic cough GERD  INTERVAL: Last seen 06/05/17 at which time I prescribed famotidine for heartburn symptoms and paroxysmal cough.  SUBJ: This is a routine reevaluation.  She was unable to tolerate famotidine believing that it caused abdominal gas and cramping.  Purchased over-the-counter omeprazole which she is using every other day with improvement in her heartburn symptoms and cough.  However, she continues to use Tums frequently for indigestion and heartburn.  She denies any other respiratory symptoms including CP, fever, purulent sputum, hemoptysis, LE edema and calf tenderness.  Continues to have mild DOE without change.  OBJ: Vitals:   07/17/17 1022 07/17/17 1028  BP:  94/60  Pulse:  (!) 54  SpO2:  92%  Weight: 66.7 kg (147 lb)   Height: 5\' 2"  (1.575 m)   RA  Gen: WDWN in NAD HEENT: NCAT, sclerae white, oropharynx normal Neck: No LAN, no JVD noted Lungs: full BS, normal percussion note, no adventitious sounds Cardiovascular: RRR, no M noted Abdomen: Soft, NT, +BS Ext: no C/C/E Neuro: Grossly intact on limited exam   DATA: BMP Latest Ref Rng & Units 09/30/2016 07/03/2016 06/25/2016  Glucose 70 - 99 mg/dL 91 77 94  BUN 6 - 23 mg/dL 15 9 11   Creatinine 0.40 - 1.20 mg/dL 0.94 0.84 0.84  BUN/Creat Ratio 12 - 28 - - -  Sodium 135 - 145 mEq/L 142 145 143  Potassium 3.5 - 5.1 mEq/L 3.5 3.9 4.1  Chloride 96 - 112 mEq/L 104 107 107  CO2 19 - 32 mEq/L 34(H) 33(H) 28  Calcium 8.4 - 10.5 mg/dL 9.7 9.2 8.8(L)    CBC Latest Ref Rng & Units 09/30/2016 07/03/2016 06/17/2016  WBC 4.0 - 10.5 K/uL 6.3 7.0 6.7  Hemoglobin 12.0 - 15.0 g/dL 14.0 12.2 13.4  Hematocrit 36.0 - 46.0 % 42.9 37.4 40.6  Platelets 150.0 - 400.0 K/uL 181.0 151.0 174    CXR: No new film   IMPRESSION: Moderate COPD - well controlled on current regimen of Symbicort  and Spiriva Cough - likely due to GERD and chronic rhinitis   PLAN: Continue Symbicort and Spiriva Continue albuterol as needed Referral made to gastroenterology for severe acid reflux Follow-up in 6 months or sooner as needed    Merton Border, MD PCCM service Mobile [phone] Pager [phone] 07/17/2017 10:45 AM

## 2017-07-23 ENCOUNTER — Other Ambulatory Visit: Payer: Self-pay | Admitting: Cardiovascular Disease

## 2017-07-23 NOTE — Telephone Encounter (Signed)
Patient notified regarding some confusion on the Metoprolol dose. The patient states, she is taking Metoprolol Tartrate 25 mg one tablet twice a day. Mrs. Henkes said, Dr. Rockey Situ instructed her to take Metoprolol 25 mg 1/2 tablet twice a day but she could increase the dose to 25 mg tablet twice a day if needed.  The patient states, the correct dose she has been taking is Metoprolol Tart 25 mg one tablet twice a day.  Please review the chart and verify with the patient the correct dosing.

## 2017-07-23 NOTE — Telephone Encounter (Signed)
Please advise on how to refill. Last OV states metoprolol 12.5mg  tablet bid; request is for 0.5 tablet 12.5 bid. Thank you.

## 2017-07-23 NOTE — Telephone Encounter (Signed)
Per last office note with Dr. Rockey Situ on 02/10/17 her medication list did read metoprolol tartrate 25 mg- 1 tablet by mouth twice daily.  The patient saw Dr. Alva Garnet on 07/17/17 and her BP- 94/60 & HR- 54.   I left a message for the patient to call to see if she has been checking her BP/ HR at home, prior to sending her refill in for metoprolol.

## 2017-08-05 ENCOUNTER — Encounter: Payer: Self-pay | Admitting: Gastroenterology

## 2017-08-05 ENCOUNTER — Ambulatory Visit: Payer: PPO | Admitting: Gastroenterology

## 2017-08-05 VITALS — BP 149/87 | HR 57 | Temp 98.5°F | Ht 62.0 in | Wt 149.6 lb

## 2017-08-05 DIAGNOSIS — K219 Gastro-esophageal reflux disease without esophagitis: Secondary | ICD-10-CM

## 2017-08-05 NOTE — Progress Notes (Signed)
Jonathon Bellows MD, MRCP(U.K) [address]  [place]  [place], [place] [address]  Main: [phone]  Fax: [phone]   Gastroenterology Consultation  Referring Provider:     Mosie Lukes, MD Primary Care Physician:  Mosie Lukes, MD Primary Gastroenterologist:  Dr. Jonathon Bellows  Reason for Consultation:     GERD        HPI:   Andrea [name] is a 77 y.o. y/o female referred for consultation & management  by Dr. Charlett Blake, Bonnita Levan, MD.    She has been referred for GERD by Dr Alva Garnet . She sees him for nocturnal cough .   Reflux:  Onset : She says she has had reflux and taken Prilosec for years as she was told it causes dementia. She has COPD , Asthma . When she was on Prilosec the symptom were controlled. Presently no control of symptoms"cannot even eat a chocolate chip cookie" Symptoms: heartburn , tickle in her throat . When she gets the tickle in her throat starts coughing a lot. Wakes up at night with cough , sometimes cannot go back to sleep, takes her inhaler . Still has heartburn.  Recent weight gain: none  Medications: On Tums  Narcotics or anticholinergics use : She is on Hydrocodone occasionaionally .  PPI /H2 blockers or Antacid  use and timing :none  Dinner time : 6 pm , goes to bed around 10-11 pm , in between stays flat or sits up   Prior EGD: no  Family history of esophageal cancer:father had colon cancer.    Ex smoker.      Past Medical History:  Diagnosis Date  . AAA (abdominal aortic aneurysm) ([place]) 01/2009   AAA (2.8 x 3.0)  moderate RAS (left); 2.7 x 2.7 cm (07/11/05)  . Acute bronchitis 03/20/2013; 2017  . Angina   . Anosmia   . Asthma   . Baker's cyst of knee 01/30/2014  . Basal cell carcinoma    "back and left arm"  . Bradycardia    Metoprolol stopped 08/2011  . CAD (coronary artery disease)    s/CABG (reports IMA and 2 SVGs) back in 1999  Myoview normal 3/10; s/p PCI with DES to PL branch of distal RCA 09/2011; PCI +DES to SVG-RCA, PCI + DES  to mid LCx 12/2015  . Cerebrovascular disease 01/2009   carotid u/s  R 0-39%   L 60-79%  . Chronic thoracic back pain   . COPD (chronic obstructive pulmonary disease) ([place])   . Depression   . Dizziness   . Dysrhythmia    hx of sinus brady  . GERD (gastroesophageal reflux disease)   . Heart murmur   . Herniated lumbar disc without myelopathy   . History of blood transfusion 1999   "when I had the bypass; had a PE"  . Hyperglycemia 10/23/2015  . Hyperlipidemia   . Hypertension   . Hypothyroidism 09/30/2016  . Medicare annual wellness visit, subsequent 07/31/2013  . NSTEMI (non-ST elevated myocardial infarction) ([facility]) 11/12   Cath showed atretic IMA graft to the LAD, SVG to PD was patent but the continuation of this graft to the PL branch was occluded; there were L to R collaterals; Mid LAD had a 60 to 70% stenosis. She has been treated medically.  Neg Myoview 05/2011  . Osteoarthritis of back   . Osteoporosis   . Pneumonia "several times"  . Pulmonary embolism ([place]) 1999   "after my bypass"  . PVD (peripheral vascular disease) ([place])   .  Shortness of breath   . Squamous carcinoma    "nose"  . Thyroid disease    Hypothyroid  . Unstable angina ([place]) 09/25/2015    Past Surgical History:  Procedure Laterality Date  . ABDOMINAL AORTIC ANEURYSM REPAIR     pt denies this hx on 01/02/2016  . BASAL CELL CARCINOMA EXCISION    . CARDIAC CATHETERIZATION N/A 01/02/2016   Procedure: Left Heart Cath and Coronary Angiography;  Surgeon: Wellington Hampshire, MD;  Location: [facility];  Service: Cardiovascular;  Laterality: N/A;  . CARDIAC CATHETERIZATION  1996; 1999  . CARDIAC CATHETERIZATION N/A 06/25/2016   Procedure: Left Heart Cath and Cors/Grafts Angiography;  Surgeon: Wellington Hampshire, MD;  Location: [facility];  Service: Cardiovascular;  Laterality: N/A;  . CAROTID ENDARTERECTOMY Left 01/02/2011  . CATARACT EXTRACTION W/ INTRAOCULAR LENS  IMPLANT, BILATERAL    . CLOSED REDUCTION NASAL  FRACTURE  11/2007  . CORONARY ANGIOPLASTY WITH STENT PLACEMENT  10/10/2011   drug eluting  to rc & saphenous  . CORONARY ARTERY BYPASS GRAFT  1999   "CABG X3"  . DILATION AND CURETTAGE OF UTERUS    . FEMORAL ARTERY STENT Bilateral   . FRACTURE SURGERY    . LEFT HEART CATHETERIZATION WITH CORONARY/GRAFT ANGIOGRAM N/A 04/22/2011   Procedure: LEFT HEART CATHETERIZATION WITH Beatrix Fetters;  Surgeon: Jolaine Artist, MD;  Location[facility];  Service: Cardiovascular;  Laterality: N/A;  . LEFT HEART CATHETERIZATION WITH CORONARY/GRAFT ANGIOGRAM N/A 10/10/2011   Procedure: LEFT HEART CATHETERIZATION WITH Beatrix Fetters;  Surgeon: Peter M Martinique, MD;  Location: [facility];  Service: Cardiovascular;  Laterality: N/A;  . NASAL SINUS SURGERY     twice  . SQUAMOUS CELL CARCINOMA EXCISION    . TUBAL LIGATION      Prior to Admission medications   Medication Sig Start Date End Date Taking? Authorizing Provider  acetaminophen (TYLENOL) 650 MG CR tablet Take 1,300 mg by mouth 2 (two) times daily.    [provider]  albuterol (PROAIR HFA) 108 (90 Base) MCG/ACT inhaler NHALE 2 PUFFS INTO THE LUNGS EVERY 6 (SIX) HOURS AS NEEDED FOR WHEEZING. 02/27/17   Wilhelmina Mcardle, MD  albuterol (PROVENTIL HFA;VENTOLIN HFA) 108 (90 Base) MCG/ACT inhaler Inhale 2 puffs into the lungs every 6 (six) hours as needed for wheezing. 04/09/16   Wilhelmina Mcardle, MD  Calcium Carbonate-Vitamin D (CALCIUM + D PO) Take 1 tablet by mouth 2 (two) times daily.    [provider]  clopidogrel (PLAVIX) 75 MG tablet TAKE 1 TABLET BY MOUTH EVERY DAY 05/12/17   Minna Merritts, MD  famotidine (PEPCID) 20 MG tablet Take 1 tablet (20 mg total) by mouth at bedtime. 06/05/17 06/05/18  Wilhelmina Mcardle, MD  fluticasone (FLONASE) 50 MCG/ACT nasal spray Place 2 sprays into both nostrils daily. For congestion 08/08/16   Mosie Lukes, MD  furosemide (LASIX) 20 MG tablet Take 1 tablet (20 mg total) by  mouth daily as needed. 08/26/16 08/26/17  Minna Merritts, MD  HYDROcodone-acetaminophen (NORCO) 10-325 MG tablet Take 1 tablet by mouth every 6 (six) hours as needed. Back pain 09/30/16   Mosie Lukes, MD  isosorbide mononitrate (IMDUR) 60 MG 24 hr tablet Take 1 tablet (60 mg total) by mouth 2 (two) times daily. 06/17/16   Minna Merritts, MD  Krill Oil 1000 MG CAPS Take 1,000 mg by mouth daily.    [provider]  levothyroxine (SYNTHROID, Vera) 25  MCG tablet Take 1 tablet (25 mcg total) by mouth daily before breakfast. 08/08/16   Mosie Lukes, MD  losartan (COZAAR) 50 MG tablet Take 1 tablet (50 mg total) by mouth daily. 02/10/17   Minna Merritts, MD  metoprolol tartrate (LOPRESSOR) 25 MG tablet Take 1 tablet (25 mg total) by mouth 2 (two) times daily. 09/30/16 02/10/17  Mosie Lukes, MD  nitroGLYCERIN (NITROSTAT) 0.4 MG SL tablet Place 1 tablet (0.4 mg total) under the tongue every 5 (five) minutes as needed for chest pain. 02/10/17   Minna Merritts, MD  Polyethyl Glycol-Propyl Glycol (SYSTANE OP) Place 1 drop into both eyes 3 (three) times daily as needed (dry eyes).     [provider]  potassium chloride (K-DUR) 10 MEQ tablet Take 1 tablet (10 mEq total) by mouth daily as needed. Patient taking differently: Take 10-20 mEq by mouth daily as needed (for cramping/lab results.).  07/24/14   Minna Merritts, MD  rosuvastatin (CRESTOR) 40 MG tablet Take 1 tablet (40 mg total) by mouth daily. 06/13/16   Minna Merritts, MD  sertraline (ZOLOFT) 50 MG tablet TAKE 1 TABLET BY MOUTH EVERY DAY 06/22/17   Mosie Lukes, MD  Spacer/Aero-Holding Josiah Lobo DEVI 1 spacer to use prn with [facility] device as directed 02/08/15   Mosie Lukes, MD  SYMBICORT 160-4.5 MCG/ACT inhaler INHALE 2 PUFFS INTO THE LUNGS 2 (TWO) TIMES DAILY. 09/17/16   Mosie Lukes, MD  tiotropium (SPIRIVA) 18 MCG inhalation capsule Place 18 mcg into inhaler and inhale daily.    [provider]  Vitamin  D, Ergocalciferol, (DRISDOL) 50000 units CAPS capsule TAKE 1 CAPSULE (50,000 UNITS TOTAL) BY MOUTH EVERY 7 (SEVEN) DAYS. 02/17/17   Mosie Lukes, MD    Family History  Problem Relation Age of Onset  . Heart attack Mother 73  . Diabetes Mother   . Colon cancer Father 51  . Lung cancer Father        smoked  . Heart disease Father        angina  . Hypertension Father   . Cancer Father        colon, lung  . Lung cancer Sister        smoked  . Cancer Sister   . Hypothyroidism Sister   . Hypertension Brother   . Heart attack Brother   . Heart disease Brother        stent  . Heart attack Brother        CABG  . Heart disease Brother        CABG with 1 bypass  . Aneurysm Brother        brain  . Alcohol abuse Brother   . Hyperlipidemia Daughter   . Diabetes Maternal Grandmother   . Stroke Maternal Grandmother   . Cirrhosis Maternal Grandfather   . Stroke Paternal Grandmother   . Obesity Daughter   . Obesity Son      Social History   Tobacco Use  . Smoking status: Former Smoker    Packs/day: 0.50    Years: 40.00    Pack years: 20.00    Types: Cigarettes    Last attempt to quit: 01/19/1993    Years since quitting: 24.5  . Smokeless tobacco: Never Used  Substance Use Topics  . Alcohol use: Yes    Comment: 01/02/2016 "might drink a glass of wine socially q 3-4 months"  . Drug use: No    Allergies as of  08/05/2017 - Review Complete 07/17/2017  Allergen Reaction Noted  . Codeine Nausea And Vomiting 10/13/2007  . Erythromycin Other (See Comments) 10/13/2007  . Meperidine hcl Nausea And Vomiting 10/13/2007  . Shellfish allergy Nausea And Vomiting 10/10/2011  . Ciprofloxacin Rash 10/13/2007  . Penicillins Rash 10/13/2007  . Tape Rash 07/15/2014    Review of Systems:    All systems reviewed and negative except where noted in HPI.   Physical Exam:  There were no vitals taken for this visit. No LMP recorded. Patient is postmenopausal. Psych:  Alert and cooperative.  Normal mood and affect. General:   Alert,  Well-developed, well-nourished, pleasant and cooperative in NAD Head:  Normocephalic and atraumatic. Eyes:  Sclera clear, no icterus.   Conjunctiva pink. Ears:  Normal auditory acuity. Nose:  No deformity, discharge, or lesions. Mouth:  No deformity or lesions,oropharynx pink & moist. Neck:  Supple; no masses or thyromegaly. Lungs:  Respirations even and unlabored.  Clear throughout to auscultation.   No wheezes, crackles, or rhonchi. No acute distress. Heart:  Regular rate and rhythm; no murmurs, clicks, rubs, or gallops. Abdomen:  Normal bowel sounds.  No bruits.  Soft, non-tender and non-distended without masses, hepatosplenomegaly or hernias noted.  No guarding or rebound tenderness.    Msk:  Symmetrical without gross deformities. Good, equal movement & strength bilaterally. Pulses:  Normal pulses noted. Extremities:  No clubbing or edema.  No cyanosis. Neurologic:  Alert and oriented x3;  grossly normal neurologically. Skin:  Intact without significant lesions or rashes. No jaundice. Lymph Nodes:  No significant cervical adenopathy. Psych:  Alert and cooperative. Normal mood and affect.  Imaging Studies: No results found.  Assessment and Plan:   Andrea [name] is a 77 y.o. y/o female has been referred for GERD. Her symptoms are very suggestive of nocturnal GERD. It seems that the reflux is triggering her breathing issues. Reassured her that new studies have shown no significant association. In either way suggested the benefits of her PPI use is greater than the risk at this time. Counseled on life style changes, suggest to use PPI first thing in the morning on empty stomach and eat 30 minutes after. Advised on the use of a wedge pillow at night , avoid meals for 2 hours prior to bed time. Discussed the risks and benefits of long term PPI use including but not limited to bone loss, chronic kidney disease, infections , low magnesium . Aim to use at  the lowest dose for the shortest period of time    Follow up in 6 weeks    Dr Jonathon Bellows MD,MRCP(U.K)

## 2017-08-13 ENCOUNTER — Other Ambulatory Visit: Payer: Self-pay | Admitting: Pulmonary Disease

## 2017-08-13 ENCOUNTER — Other Ambulatory Visit: Payer: Self-pay

## 2017-08-13 DIAGNOSIS — K219 Gastro-esophageal reflux disease without esophagitis: Secondary | ICD-10-CM

## 2017-08-13 MED ORDER — DEXLANSOPRAZOLE 60 MG PO CPDR: 60.0000 mg | DELAYED_RELEASE_CAPSULE | Freq: Every day | ORAL | 0 refills | Status: DC

## 2017-08-13 NOTE — Telephone Encounter (Signed)
Pt states Dr. Vicente Males Wanted pt to let us know if  rx Dexalint worked she states she would like it called in to [facility] dr. Pt call back  [phone]

## 2017-08-17 ENCOUNTER — Telehealth: Payer: Self-pay | Admitting: Gastroenterology

## 2017-08-17 NOTE — Telephone Encounter (Signed)
Pt left vm to let Dr. Vicente Males know her rx Dexalin cost $90 please call pt

## 2017-08-25 ENCOUNTER — Other Ambulatory Visit: Payer: Self-pay | Admitting: Family Medicine

## 2017-08-25 ENCOUNTER — Other Ambulatory Visit: Payer: Self-pay | Admitting: Cardiovascular Disease

## 2017-09-04 DIAGNOSIS — J322 Chronic ethmoidal sinusitis: Secondary | ICD-10-CM | POA: Diagnosis not present

## 2017-09-04 DIAGNOSIS — H6121 Impacted cerumen, right ear: Secondary | ICD-10-CM | POA: Diagnosis not present

## 2017-09-28 IMAGING — CR DG CHEST 2V
1 series · 2 of 2 positions shown · non-contrast
Comparison: 03/19/2015

CLINICAL DATA: Preoperative evaluation for upcoming cardiac
catheterization

EXAM:
CHEST  2 VIEW

[Series 1: w chest pa · 0.14mm/px · 2 of 2 slices shown]
[im 1/2]
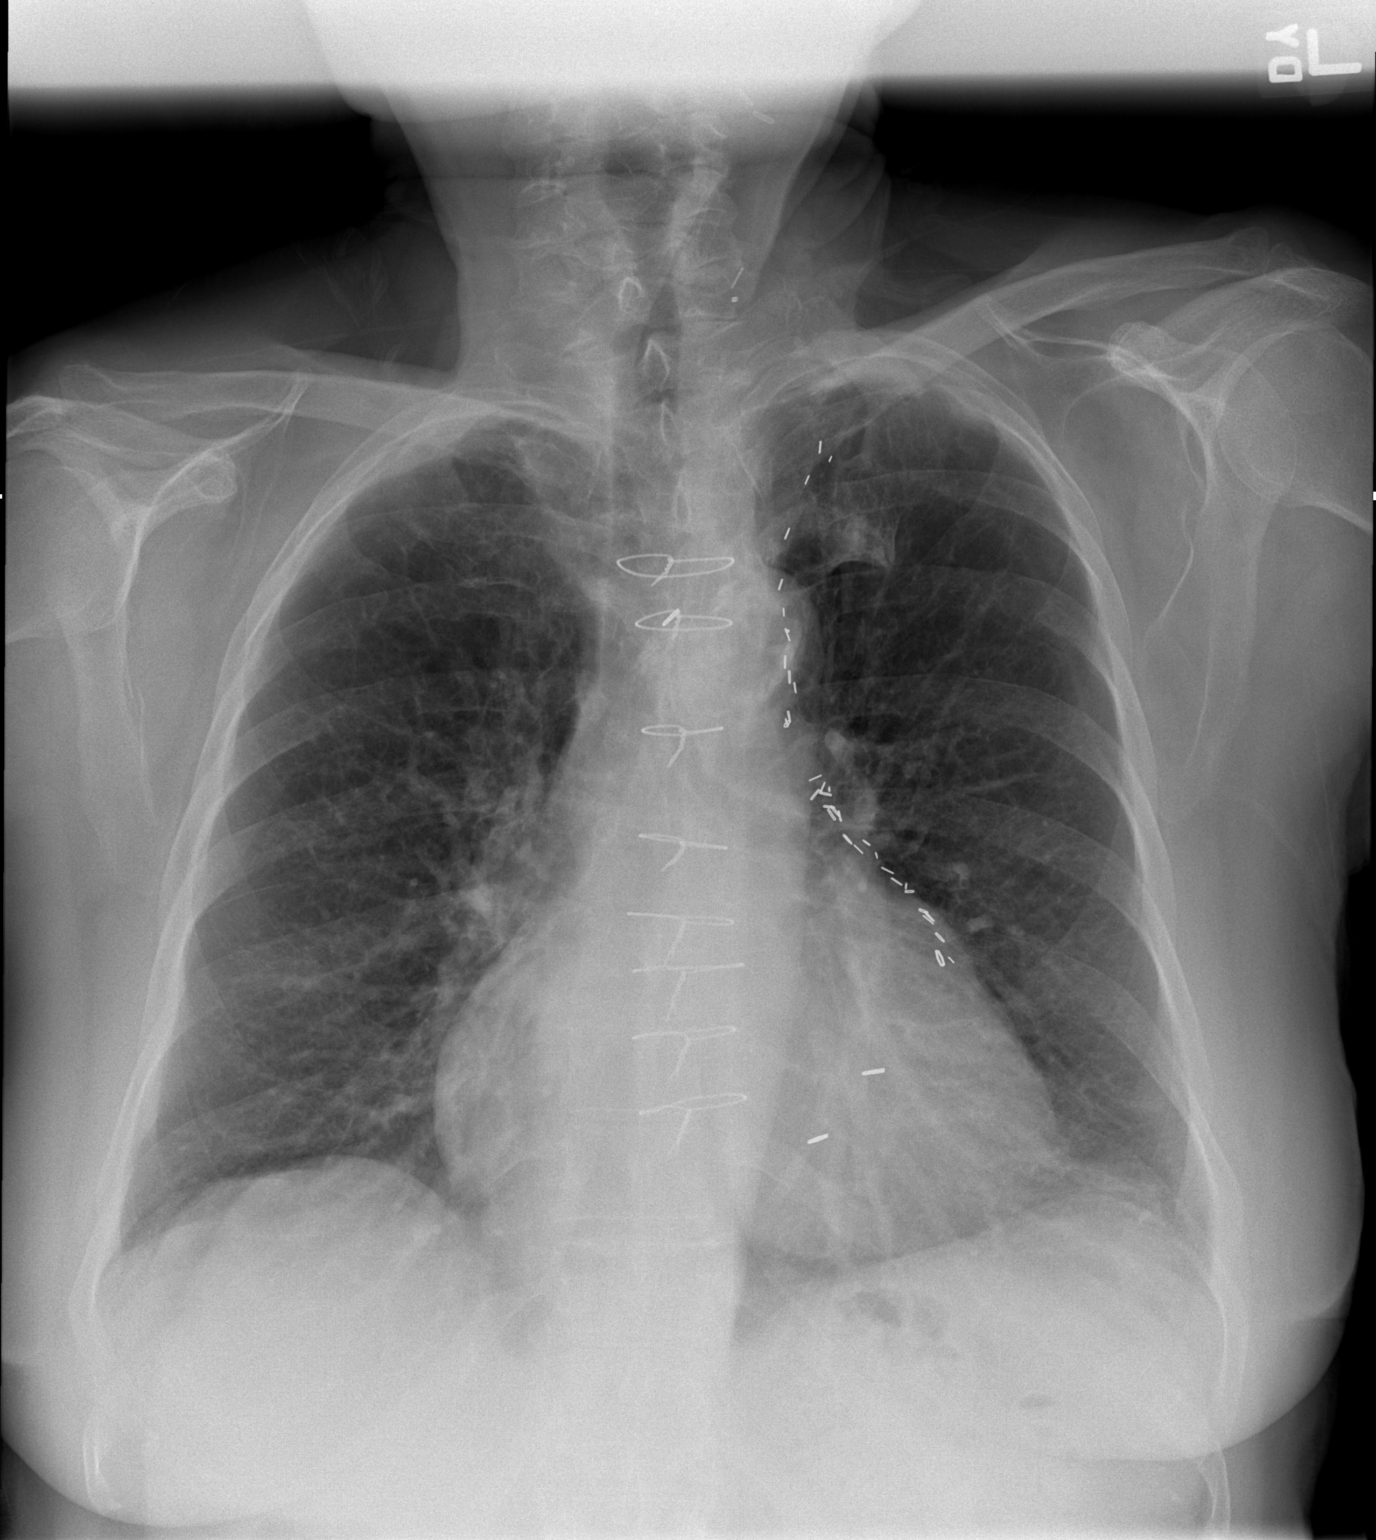
[im 2/2]
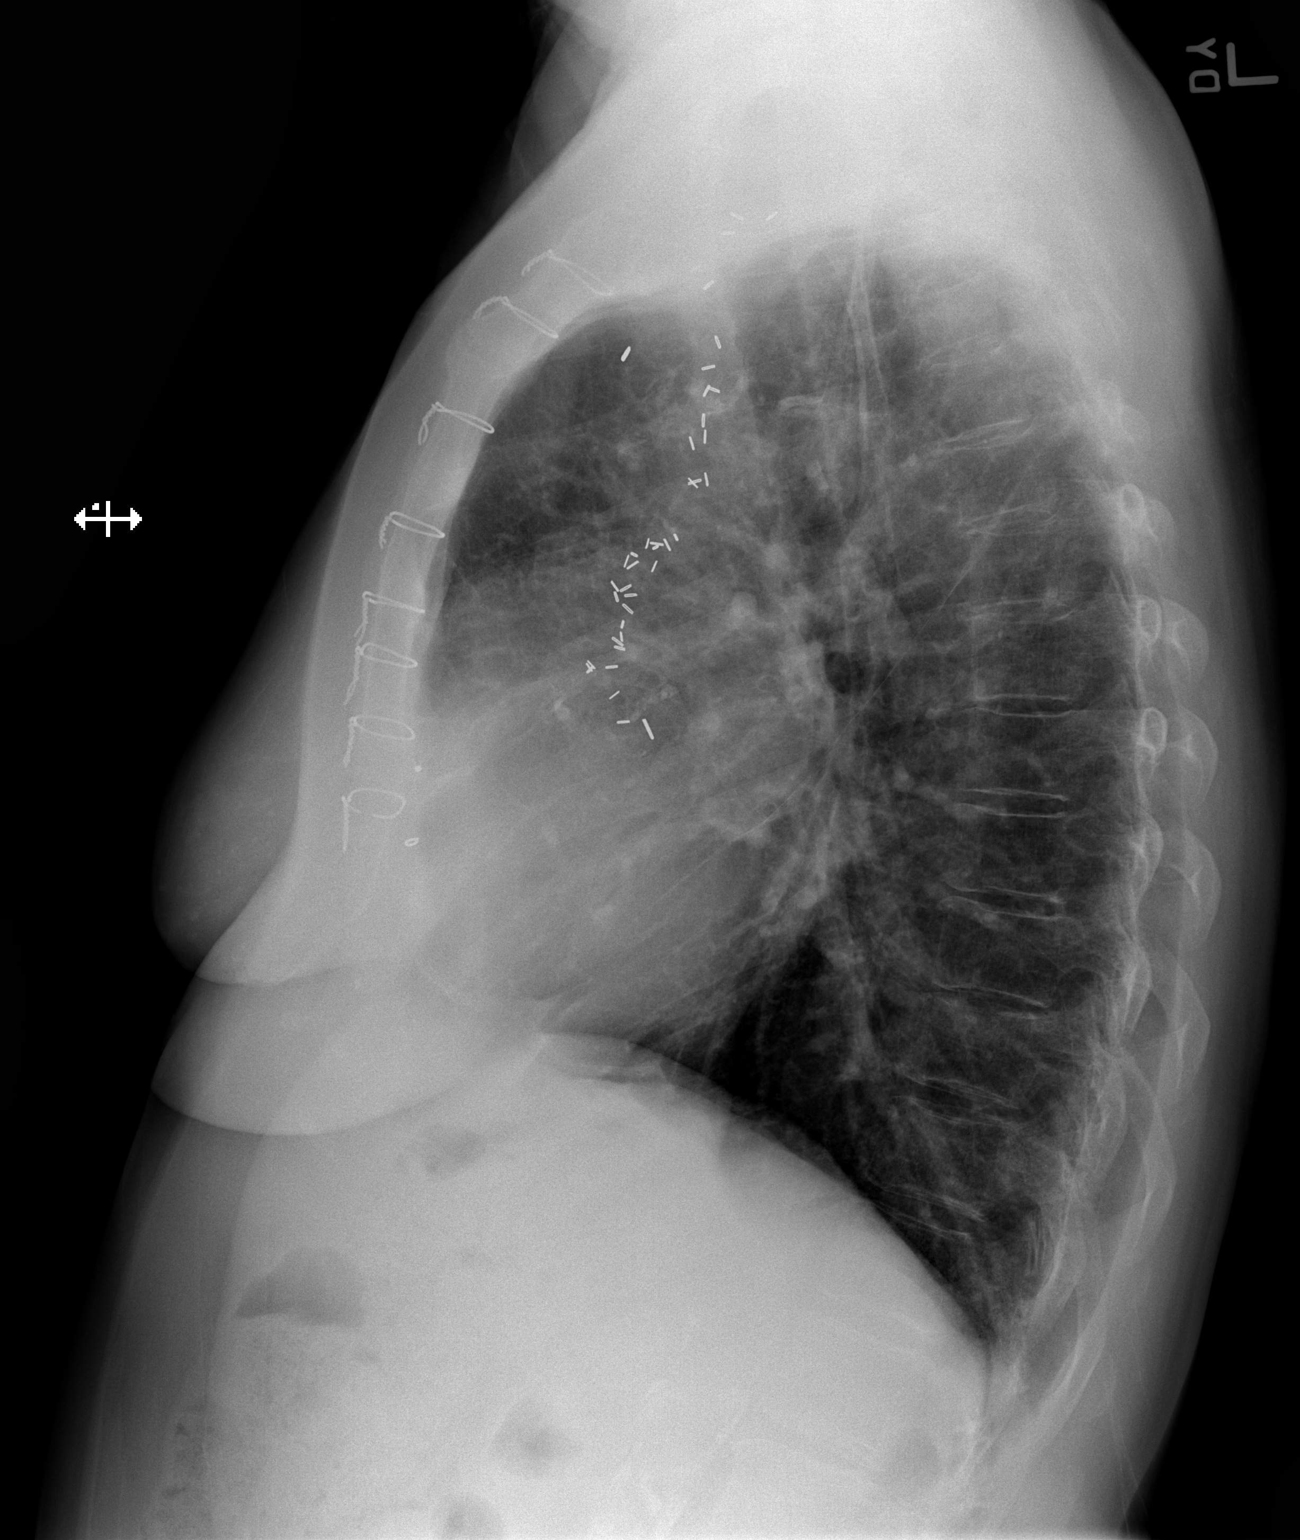

[2 of 2 positions shown; findings below may reference images not displayed]

FINDINGS: Cardiac shadow is mildly enlarged. Postsurgical changes are again
seen and stable. The lungs are clear bilaterally. No acute bony
abnormality is seen.
IMPRESSION: No active cardiopulmonary disease.

## 2017-09-29 NOTE — Progress Notes (Deleted)
Subjective:   Andrea [name] is a 77 y.o. female who presents for Medicare Annual (Subsequent) preventive examination.  Review of Systems: No ROS.  Medicare Wellness Visit. Additional risk factors are reflected in the social history.    Sleep patterns: Home Safety/Smoke Alarms: Feels safe in home. Smoke alarms in place.  Living environment; residence and Firearm Safety:   Female:      Mammo-utd       Dexa scan- utd       CCS-     Objective:     Vitals: There were no vitals taken for this visit.  There is no height or weight on file to calculate BMI.  Advanced Directives 09/30/2016 06/25/2016 01/02/2016 01/02/2016 07/15/2014 10/10/2011 10/10/2011  Does Patient Have a Medical Advance Directive? Yes Yes Yes Yes No;Yes Patient has advance directive, copy not in chart Patient has advance directive, copy not in chart  Type of Advance Directive [place];Living will [place];Living will - [place];Living will [place];Living will;Out of facility DNR (pink MOST or yellow form) [place];Living will [place];Living will  Does patient want to make changes to medical advance directive? - No - Patient declined No - Patient declined - - - -  Copy of [place] in Chart? No - copy requested No - copy requested No - copy requested No - copy requested No - copy requested Copy requested from other (Comment) -  Pre-existing out of facility DNR order (yellow form or pink MOST form) - - - - - - No    Tobacco Social History   Tobacco Use  Smoking Status Former Smoker  . Packs/day: 0.50  . Years: 40.00  . Pack years: 20.00  . Types: Cigarettes  . Last attempt to quit: 01/19/1993  . Years since quitting: 24.7  Smokeless Tobacco Never Used     Counseling given: Not Answered   Clinical Intake:                       Past Medical History:  Diagnosis Date    . AAA (abdominal aortic aneurysm) ([place]) 01/2009   AAA (2.8 x 3.0)  moderate RAS (left); 2.7 x 2.7 cm (07/11/05)  . Acute bronchitis 03/20/2013; 2017  . Angina   . Anosmia   . Asthma   . Baker's cyst of knee 01/30/2014  . Basal cell carcinoma    "back and left arm"  . Bradycardia    Metoprolol stopped 08/2011  . CAD (coronary artery disease)    s/CABG (reports IMA and 2 SVGs) back in 1999  Myoview normal 3/10; s/p PCI with DES to PL branch of distal RCA 09/2011; PCI +DES to SVG-RCA, PCI + DES to mid LCx 12/2015  . Cerebrovascular disease 01/2009   carotid u/s  R 0-39%   L 60-79%  . Chronic thoracic back pain   . COPD (chronic obstructive pulmonary disease) ([place])   . Depression   . Dizziness   . Dysrhythmia    hx of sinus brady  . GERD (gastroesophageal reflux disease)   . Heart murmur   . Herniated lumbar disc without myelopathy   . History of blood transfusion 1999   "when I had the bypass; had a PE"  . Hyperglycemia 10/23/2015  . Hyperlipidemia   . Hypertension   . Hypothyroidism 09/30/2016  . Medicare annual wellness visit, subsequent 07/31/2013  . NSTEMI (non-ST elevated myocardial  infarction) ([place]) 11/12   Cath showed atretic IMA graft to the LAD, SVG to PD was patent but the continuation of this graft to the PL branch was occluded; there were L to R collaterals; Mid LAD had a 60 to 70% stenosis. She has been treated medically.  Neg Myoview 05/2011  . Osteoarthritis of back   . Osteoporosis   . Pneumonia "several times"  . Pulmonary embolism ([place]) 1999   "after my bypass"  . PVD (peripheral vascular disease) ([place])   . Shortness of breath   . Squamous carcinoma    "nose"  . Thyroid disease    Hypothyroid  . Unstable angina ([place]) 09/25/2015   Past Surgical History:  Procedure Laterality Date  . ABDOMINAL AORTIC ANEURYSM REPAIR     pt denies this hx on 01/02/2016  . BASAL CELL CARCINOMA EXCISION    . CARDIAC CATHETERIZATION N/A 01/02/2016   Procedure: Left Heart Cath and Coronary  Angiography;  Surgeon: Wellington Hampshire, MD;  Location: [facility];  Service: Cardiovascular;  Laterality: N/A;  . CARDIAC CATHETERIZATION  1996; 1999  . CARDIAC CATHETERIZATION N/A 06/25/2016   Procedure: Left Heart Cath and Cors/Grafts Angiography;  Surgeon: Wellington Hampshire, MD;  Location: [facility];  Service: Cardiovascular;  Laterality: N/A;  . CAROTID ENDARTERECTOMY Left 01/02/2011  . CATARACT EXTRACTION W/ INTRAOCULAR LENS  IMPLANT, BILATERAL    . CLOSED REDUCTION NASAL FRACTURE  11/2007  . CORONARY ANGIOPLASTY WITH STENT PLACEMENT  10/10/2011   drug eluting  to rc & saphenous  . CORONARY ARTERY BYPASS GRAFT  1999   "CABG X3"  . DILATION AND CURETTAGE OF UTERUS    . FEMORAL ARTERY STENT Bilateral   . FRACTURE SURGERY    . LEFT HEART CATHETERIZATION WITH CORONARY/GRAFT ANGIOGRAM N/A 04/22/2011   Procedure: LEFT HEART CATHETERIZATION WITH Beatrix Fetters;  Surgeon: Jolaine Artist, MD;  Location: [facility];  Service: Cardiovascular;  Laterality: N/A;  . LEFT HEART CATHETERIZATION WITH CORONARY/GRAFT ANGIOGRAM N/A 10/10/2011   Procedure: LEFT HEART CATHETERIZATION WITH Beatrix Fetters;  Surgeon: Peter M Martinique, MD;  Location: [facility] C[facility];  Service: Cardiovascular;  Laterality: N/A;  . NASAL SINUS SURGERY     twice  . SQUAMOUS CELL CARCINOMA EXCISION    . TUBAL LIGATION     Family History  Problem Relation Age of Onset  . Heart attack Mother 51  . Diabetes Mother   . Colon cancer Father 49  . Lung cancer Father        smoked  . Heart disease Father        angina  . Hypertension Father   . Cancer Father        colon, lung  . Lung cancer Sister        smoked  . Cancer Sister   . Hypothyroidism Sister   . Hypertension Brother   . Heart attack Brother   . Heart disease Brother        stent  . Heart attack Brother        CABG  . Heart disease Brother        CABG with 1 bypass  . Aneurysm Brother        brain  . Alcohol abuse Brother     . Hyperlipidemia Daughter   . Diabetes Maternal Grandmother   . Stroke Maternal Grandmother   . Cirrhosis Maternal Grandfather   . Stroke Paternal Grandmother   . Obesity Daughter   . Obesity  Son    Social History   Socioeconomic History  . Marital status: Married    Spouse name: Not on file  . Number of children: 5  . Years of education: Not on file  . Highest education level: Not on file  Occupational History  . 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 Financial resource strain: Not on file  . Food insecurity:    Worry: Not on file    Inability: Not on file  . Transportation needs:    Medical: Not on file    Non-medical: Not on file  Tobacco Use  . Smoking status: Former Smoker    Packs/day: 0.50    Years: 40.00    Pack years: 20.00    Types: Cigarettes    Last attempt to quit: 01/19/1993    Years since quitting: 24.7  . Smokeless tobacco: Never Used  Substance and Sexual Activity  . Alcohol use: Yes    Comment: 01/02/2016 "might drink a glass of wine socially q 3-4 months"  . Drug use: No  . Sexual activity: Not Currently    Birth control/protection: Post-menopausal  Lifestyle  . Physical activity:    Days per week: Not on file    Minutes per session: Not on file  . Stress: Not on file  Relationships  . Social connections:    Talks on phone: Not on file    Gets together: Not on file    Attends religious service: Not on file    Active member of club or organization: Not on file    Attends meetings of clubs or organizations: Not on file    Relationship status: Not on file  Other Topics Concern  . Not on file  Social History Narrative   Married with 5 children    Outpatient Encounter Medications as of 10/01/2017  Medication Sig  . acetaminophen (TYLENOL) 650 MG CR tablet Take 1,300 mg by mouth 2 (two) times daily.  Marland Kitchen albuterol (PROAIR HFA) 108 (90 Base) MCG/ACT inhaler NHALE 2 PUFFS INTO THE LUNGS EVERY 6 (SIX)  HOURS AS NEEDED FOR WHEEZING.  Marland Kitchen albuterol (PROVENTIL HFA;VENTOLIN HFA) 108 (90 Base) MCG/ACT inhaler Inhale 2 puffs into the lungs every 6 (six) hours as needed for wheezing.  . Calcium Carbonate-Vitamin D (CALCIUM + D PO) Take 1 tablet by mouth 2 (two) times daily.  . clopidogrel (PLAVIX) 75 MG tablet TAKE 1 TABLET BY MOUTH EVERY DAY  . dexlansoprazole (DEXILANT) 60 MG capsule Take 1 capsule (60 mg total) by mouth daily.  . famotidine (PEPCID) 20 MG tablet Take 1 tablet (20 mg total) by mouth at bedtime. (Patient not taking: Reported on 08/05/2017)  . fluticasone (FLONASE) 50 MCG/ACT nasal spray Place 2 sprays into both nostrils daily. For congestion  . furosemide (LASIX) 20 MG tablet TAKE 1 TABLET (20 MG TOTAL) BY MOUTH DAILY AS NEEDED.  Marland Kitchen HYDROcodone-acetaminophen (NORCO) 10-325 MG tablet Take 1 tablet by mouth every 6 (six) hours as needed. Back pain  . isosorbide mononitrate (IMDUR) 60 MG 24 hr tablet Take 1 tablet (60 mg total) by mouth 2 (two) times daily.  Javier Docker Oil 1000 MG CAPS Take 1,000 mg by mouth daily.  Marland Kitchen levothyroxine (SYNTHROID, LEVOTHROID) 25 MCG tablet TAKE 1 TABLET (25 MCG TOTAL) BY MOUTH DAILY BEFORE BREAKFAST.  Marland Kitchen losartan (COZAAR) 50 MG tablet Take 1 tablet (50 mg total) by mouth daily.  . metoprolol tartrate (LOPRESSOR) 25 MG tablet  Take 1 tablet (25 mg total) by mouth 2 (two) times daily.  . nitroGLYCERIN (NITROSTAT) 0.4 MG SL tablet Place 1 tablet (0.4 mg total) under the tongue every 5 (five) minutes as needed for chest pain.  Vladimir Faster Glycol-Propyl Glycol (SYSTANE OP) Place 1 drop into both eyes 3 (three) times daily as needed (dry eyes).   . potassium chloride (K-DUR) 10 MEQ tablet Take 1 tablet (10 mEq total) by mouth daily as needed. (Patient taking differently: Take 10-20 mEq by mouth daily as needed (for cramping/lab results.). )  . rosuvastatin (CRESTOR) 40 MG tablet TAKE 1 TABLET (40 MG TOTAL) BY MOUTH DAILY.  Marland Kitchen sertraline (ZOLOFT) 50 MG tablet TAKE 1 TABLET BY  MOUTH EVERY DAY  . Spacer/Aero-Holding Chambers DEVI 1 spacer to use prn with HFA device as directed  . SPIRIVA HANDIHALER 18 MCG inhalation capsule INHALE 1 CAPSULE VIA HANDIHALER ONCE DAILY AT THE SAME TIME EVERY DAY  . SYMBICORT 160-4.5 MCG/ACT inhaler INHALE 2 PUFFS INTO THE LUNGS 2 (TWO) TIMES DAILY.  Marland Kitchen tiotropium (SPIRIVA) 18 MCG inhalation capsule Place 18 mcg into inhaler and inhale daily.  . Vitamin D, Ergocalciferol, (DRISDOL) 50000 units CAPS capsule TAKE 1 CAPSULE (50,000 UNITS TOTAL) BY MOUTH EVERY 7 (SEVEN) DAYS.   No facility-administered encounter medications on file as of 10/01/2017.     Activities of Daily Living In your present state of health, do you have any difficulty performing the following activities: 09/30/2016  Hearing? Y  Comment has hearing aids-doesn't rear them  Vision? N  Difficulty concentrating or making decisions? Y  Comment states she has problems with short term memory  Walking or climbing stairs? N  Dressing or bathing? N  Doing errands, shopping? N  Preparing Food and eating ? N  Using the Toilet? N  In the past six months, have you accidently leaked urine? N  Do you have problems with loss of bowel control? N  Managing your Medications? N  Managing your Finances? N  Housekeeping or managing your Housekeeping? N  Some recent data might be hidden    Patient Care Team: Mosie Lukes, MD as PCP - General (Family Medicine) Minna Merritts, MD as Consulting Physician (Cardiology) Izora Gala, MD as Consulting Physician (Otolaryngology)    Assessment:   This is a routine wellness examination for [place]. Physical assessment deferred to PCP.   Exercise Activities and Dietary recommendations   Diet (meal preparation, eat out, water intake, caffeinated beverages, dairy products, fruits and vegetables): {Desc; diets:16563} Breakfast: Lunch:  Dinner:      Goals    . Weight (lb) < 135 lb (61.2 kg)       Fall Risk Fall Risk  09/30/2016  03/10/2016 03/10/2016 01/31/2016 08/28/2015  Falls in the past year? No No No No No    Depression Screen PHQ 2/9 Scores 09/30/2016 03/10/2016 03/10/2016 02/13/2016  PHQ - 2 Score 0 0 0 0  PHQ- 9 Score - - - -  Exception Documentation - - Patient refusal -     Cognitive Function MMSE - Mini Mental State Exam 09/30/2016  Orientation to time 5  Orientation to Place 5  Registration 3  Attention/ Calculation 5  Recall 3  Language- name 2 objects 2  Language- repeat 1  Language- follow 3 step command 3  Language- read & follow direction 1  Write a sentence 1  Copy design 1  Total score 30        Immunization History  Administered Date(s) Administered  .  Influenza,inj,Quad PF,6+ Mos 04/15/2016  . Pneumococcal Conjugate-13 01/24/2014  . Pneumococcal Polysaccharide-23 05/25/2002  . Td 05/24/1999  . Tdap 01/24/2014   Screening Tests Health Maintenance  Topic Date Due  . PNA vac Low Risk Adult (2 of 2 - PPSV23) 09/30/2017 (Originally 01/25/2015)  . INFLUENZA VACCINE  12/24/2017  . TETANUS/TDAP  01/25/2024  . DEXA SCAN  Completed    Plan:   ***   I have personally reviewed and noted the following in the patient's chart:   . Medical and social history . Use of alcohol, tobacco or illicit drugs  . Current medications and supplements . Functional ability and status . Nutritional status . Physical activity . Advanced directives . List of other physicians . Hospitalizations, surgeries, and ER visits in previous 12 months . Vitals . Screenings to include cognitive, depression, and falls . Referrals and appointments  In addition, I have reviewed and discussed with patient certain preventive protocols, quality metrics, and best practice recommendations. A written personalized care plan for preventive services as well as general preventive health recommendations were provided to patient.     Shela Nevin, [place]  09/29/2017

## 2017-09-30 ENCOUNTER — Encounter: Payer: Self-pay | Admitting: Gastroenterology

## 2017-09-30 ENCOUNTER — Ambulatory Visit: Payer: PPO | Admitting: Gastroenterology

## 2017-10-01 ENCOUNTER — Ambulatory Visit: Payer: PPO | Admitting: *Deleted

## 2017-10-21 ENCOUNTER — Other Ambulatory Visit: Payer: Self-pay | Admitting: *Deleted

## 2017-10-21 MED ORDER — BUDESONIDE-FORMOTEROL FUMARATE 160-4.5 MCG/ACT IN AERO: INHALATION_SPRAY | RESPIRATORY_TRACT | 4 refills | Status: DC

## 2017-10-31 ENCOUNTER — Other Ambulatory Visit: Payer: Self-pay | Admitting: Cardiovascular Disease

## 2017-11-11 ENCOUNTER — Other Ambulatory Visit: Payer: Self-pay | Admitting: Family Medicine

## 2017-11-11 ENCOUNTER — Other Ambulatory Visit: Payer: Self-pay | Admitting: *Deleted

## 2017-11-11 MED ORDER — TIOTROPIUM BROMIDE MONOHYDRATE 18 MCG IN CAPS: ORAL_CAPSULE | RESPIRATORY_TRACT | 3 refills | Status: DC

## 2017-11-22 ENCOUNTER — Other Ambulatory Visit: Payer: Self-pay | Admitting: Cardiovascular Disease

## 2017-12-01 ENCOUNTER — Ambulatory Visit (INDEPENDENT_AMBULATORY_CARE_PROVIDER_SITE_OTHER): Payer: PPO | Admitting: Family Medicine

## 2017-12-01 ENCOUNTER — Telehealth: Payer: Self-pay | Admitting: Internal Medicine

## 2017-12-01 ENCOUNTER — Encounter: Payer: Self-pay | Admitting: Family Medicine

## 2017-12-01 VITALS — BP 122/84 | HR 51 | Temp 98.1°F | Resp 18 | Ht 62.0 in | Wt 145.6 lb

## 2017-12-01 DIAGNOSIS — M810 Age-related osteoporosis without current pathological fracture: Secondary | ICD-10-CM | POA: Diagnosis not present

## 2017-12-01 DIAGNOSIS — R0609 Other forms of dyspnea: Secondary | ICD-10-CM | POA: Diagnosis not present

## 2017-12-01 DIAGNOSIS — Z23 Encounter for immunization: Secondary | ICD-10-CM

## 2017-12-01 DIAGNOSIS — K219 Gastro-esophageal reflux disease without esophagitis: Secondary | ICD-10-CM | POA: Diagnosis not present

## 2017-12-01 DIAGNOSIS — R058 Other specified cough: Secondary | ICD-10-CM

## 2017-12-01 DIAGNOSIS — Z124 Encounter for screening for malignant neoplasm of cervix: Secondary | ICD-10-CM | POA: Diagnosis not present

## 2017-12-01 DIAGNOSIS — E039 Hypothyroidism, unspecified: Secondary | ICD-10-CM | POA: Diagnosis not present

## 2017-12-01 DIAGNOSIS — E782 Mixed hyperlipidemia: Secondary | ICD-10-CM | POA: Diagnosis not present

## 2017-12-01 DIAGNOSIS — Z1231 Encounter for screening mammogram for malignant neoplasm of breast: Secondary | ICD-10-CM

## 2017-12-01 DIAGNOSIS — R05 Cough: Secondary | ICD-10-CM

## 2017-12-01 DIAGNOSIS — R0602 Shortness of breath: Secondary | ICD-10-CM

## 2017-12-01 DIAGNOSIS — I10 Essential (primary) hypertension: Secondary | ICD-10-CM | POA: Diagnosis not present

## 2017-12-01 DIAGNOSIS — Z1211 Encounter for screening for malignant neoplasm of colon: Secondary | ICD-10-CM | POA: Diagnosis not present

## 2017-12-01 DIAGNOSIS — R0600 Dyspnea, unspecified: Secondary | ICD-10-CM

## 2017-12-01 DIAGNOSIS — Z0000 Encounter for general adult medical examination without abnormal findings: Secondary | ICD-10-CM | POA: Diagnosis not present

## 2017-12-01 DIAGNOSIS — Z1239 Encounter for other screening for malignant neoplasm of breast: Secondary | ICD-10-CM

## 2017-12-01 DIAGNOSIS — R739 Hyperglycemia, unspecified: Secondary | ICD-10-CM | POA: Diagnosis not present

## 2017-12-01 DIAGNOSIS — I059 Rheumatic mitral valve disease, unspecified: Secondary | ICD-10-CM

## 2017-12-01 LAB — MICROALBUMIN / CREATININE URINE RATIO
CREATININE, U: 35 mg/dL
Microalb Creat Ratio: 19.7 mg/g (ref 0.0–30.0)
Microalb, Ur: 6.9 mg/dL — ABNORMAL HIGH (ref 0.0–1.9)

## 2017-12-01 MED ORDER — FAMOTIDINE 20 MG PO TABS: 20.0000 mg | ORAL_TABLET | Freq: Every day | ORAL | 3 refills | Status: DC

## 2017-12-01 MED ORDER — FAMOTIDINE 20 MG PO TABS: 20.0000 mg | ORAL_TABLET | Freq: Every day | ORAL | 5 refills | Status: DC

## 2017-12-01 NOTE — Assessment & Plan Note (Signed)
Worsening with exertion, has been seeing pulmonology and gastroenterology with no symptom control. Has notable valvular heart disease. Is referred back to her cardiologist and set up for repeat echo.

## 2017-12-01 NOTE — Patient Instructions (Addendum)
Biotin, Zinc 50 mg and fatty acid supplement such as fish oil or krill oil and 64 oz of fluids daily   Shingrix is the new shingles, 2 shots over 2-6 months at pharmacy.  Preventive Care 12 Years and Older, Female Preventive care refers to lifestyle choices and visits with your health care provider that can promote health and wellness. What does preventive care include?  A yearly physical exam. This is also called an annual well check.  Dental exams once or twice a year.  Routine eye exams. Ask your health care provider how often you should have your eyes checked.  Personal lifestyle choices, including: ? Daily care of your teeth and gums. ? Regular physical activity. ? Eating a healthy diet. ? Avoiding tobacco and drug use. ? Limiting alcohol use. ? Practicing safe sex. ? Taking low-dose aspirin every day. ? Taking vitamin and mineral supplements as recommended by your health care provider. What happens during an annual well check? The services and screenings done by your health care provider during your annual well check will depend on your age, overall health, lifestyle risk factors, and family history of disease. Counseling Your health care provider may ask you questions about your:  Alcohol use.  Tobacco use.  Drug use.  Emotional well-being.  Home and relationship well-being.  Sexual activity.  Eating habits.  History of falls.  Memory and ability to understand (cognition).  Work and work Statistician.  Reproductive health.  Screening You may have the following tests or measurements:  Height, weight, and BMI.  Blood pressure.  Lipid and cholesterol levels. These may be checked every 5 years, or more frequently if you are over 79 years old.  Skin check.  Lung cancer screening. You may have this screening every year starting at age 69 if you have a 30-pack-year history of smoking and currently smoke or have quit within the past 15 years.  Fecal occult  blood test (FOBT) of the stool. You may have this test every year starting at age 9.  Flexible sigmoidoscopy or colonoscopy. You may have a sigmoidoscopy every 5 years or a colonoscopy every 10 years starting at age 65.  Hepatitis C blood test.  Hepatitis B blood test.  Sexually transmitted disease (STD) testing.  Diabetes screening. This is done by checking your blood sugar (glucose) after you have not eaten for a while (fasting). You may have this done every 1-3 years.  Bone density scan. This is done to screen for osteoporosis. You may have this done starting at age 43.  Mammogram. This may be done every 1-2 years. Talk to your health care provider about how often you should have regular mammograms.  Talk with your health care provider about your test results, treatment options, and if necessary, the need for more tests. Vaccines Your health care provider may recommend certain vaccines, such as:  Influenza vaccine. This is recommended every year.  Tetanus, diphtheria, and acellular pertussis (Tdap, Td) vaccine. You may need a Td booster every 10 years.  Varicella vaccine. You may need this if you have not been vaccinated.  Zoster vaccine. You may need this after age 56.  Measles, mumps, and rubella (MMR) vaccine. You may need at least one dose of MMR if you were born in 1957 or later. You may also need a second dose.  Pneumococcal 13-valent conjugate (PCV13) vaccine. One dose is recommended after age 3.  Pneumococcal polysaccharide (PPSV23) vaccine. One dose is recommended after age 39.  Meningococcal vaccine. You  may need this if you have certain conditions.  Hepatitis A vaccine. You may need this if you have certain conditions or if you travel or work in places where you may be exposed to hepatitis A.  Hepatitis B vaccine. You may need this if you have certain conditions or if you travel or work in places where you may be exposed to hepatitis B.  Haemophilus influenzae  type b (Hib) vaccine. You may need this if you have certain conditions.  Talk to your health care provider about which screenings and vaccines you need and how often you need them. This information is not intended to replace advice given to you by your health care provider. Make sure you discuss any questions you have with your health care provider. Document Released: 06/08/2015 Document Revised: 01/30/2016 Document Reviewed: 03/13/2015 Elsevier Interactive Patient Education  [employer].

## 2017-12-01 NOTE — Progress Notes (Signed)
Subjective:  I acted as a Education administrator for Dr. Charlett Blake. [place], [place]  [name], female    DOB: Dec 22, 1940, 77 y.o.   MRN: [id number]  Chief Complaint  Patient presents with  . Annual Exam    HPI  Patient is in today for an annual exam and follow up on chronic medical concerns including hyperlipidemia, mitral valve disorder, CAD, hypertension, osteoporosis and hypothyroidism. No recent febrile illness or hospitalizations. No polyuria or polydipsia. Is noting a worsening of dyspnea on exertion, not at risk. Denies any GYN or breast concerns. Is doing well with activities of daily living and trying to maintain a heart healthy diet. Denies CP/palp/SOB/HA/congestion/fevers/GI or GU c/o. Taking meds as prescribed  Patient Care Team: Mosie Lukes, MD as PCP - General (Family Medicine) Minna Merritts, MD as Consulting Physician (Cardiology) Izora Gala, MD as Consulting Physician (Otolaryngology)   Past Medical History:  Diagnosis Date  . AAA (abdominal aortic aneurysm) ([place]) 01/2009   AAA (2.8 x 3.0)  moderate RAS (left); 2.7 x 2.7 cm (07/11/05)  . Acute bronchitis 03/20/2013; 2017  . Angina   . Anosmia   . Asthma   . Baker's cyst of knee 01/30/2014  . Basal cell carcinoma    "back and left arm"  . Bradycardia    Metoprolol stopped 08/2011  . CAD (coronary artery disease)    s/CABG (reports IMA and 2 SVGs) back in 1999  Myoview normal 3/10; s/p PCI with DES to PL branch of distal RCA 09/2011; PCI +DES to SVG-RCA, PCI + DES to mid LCx 12/2015  . Cerebrovascular disease 01/2009   carotid u/s  R 0-39%   L 60-79%  . Chronic thoracic back pain   . COPD (chronic obstructive pulmonary disease) ([place])   . Depression   . Dizziness   . Dysrhythmia    hx of sinus brady  . GERD (gastroesophageal reflux disease)   . Heart murmur   . Herniated lumbar disc without myelopathy   . History of blood transfusion 1999   "when I had the bypass; had a PE"  . Hyperglycemia 10/23/2015  .  Hyperlipidemia   . Hypertension   . Hypothyroidism 09/30/2016  . Medicare annual wellness visit, subsequent 07/31/2013  . NSTEMI (non-ST elevated myocardial infarction) ([place]) 11/12   Cath showed atretic IMA graft to the LAD, SVG to PD was patent but the continuation of this graft to the PL branch was occluded; there were L to R collaterals; Mid LAD had a 60 to 70% stenosis. She has been treated medically.  Neg Myoview 05/2011  . Osteoarthritis of back   . Osteoporosis   . Pneumonia "several times"  . Pulmonary embolism ([place]) 1999   "after my bypass"  . PVD (peripheral vascular disease) ([place])   . Shortness of breath   . Squamous carcinoma    "nose"  . Thyroid disease    Hypothyroid  . Unstable angina ([place]) 09/25/2015    Past Surgical History:  Procedure Laterality Date  . ABDOMINAL AORTIC ANEURYSM REPAIR     pt denies this hx on 01/02/2016  . BASAL CELL CARCINOMA EXCISION    . CARDIAC CATHETERIZATION N/A 01/02/2016   Procedure: Left Heart Cath and Coronary Angiography;  Surgeon: Wellington Hampshire, MD;  Location: [facility];  Service: Cardiovascular;  Laterality: N/A;  . CARDIAC CATHETERIZATION  1996; 1999  . CARDIAC CATHETERIZATION N/A 06/25/2016   Procedure: Left Heart Cath and Cors/Grafts Angiography;  Surgeon: Wellington Hampshire,  MD;  Location: [facility];  Service: Cardiovascular;  Laterality: N/A;  . CAROTID ENDARTERECTOMY Left 01/02/2011  . CATARACT EXTRACTION W/ INTRAOCULAR LENS  IMPLANT, BILATERAL    . CLOSED REDUCTION NASAL FRACTURE  11/2007  . CORONARY ANGIOPLASTY WITH STENT PLACEMENT  10/10/2011   drug eluting  to rc & saphenous  . CORONARY ARTERY BYPASS GRAFT  1999   "CABG X3"  . DILATION AND CURETTAGE OF UTERUS    . FEMORAL ARTERY STENT Bilateral   . FRACTURE SURGERY    . LEFT HEART CATHETERIZATION WITH CORONARY/GRAFT ANGIOGRAM N/A 04/22/2011   Procedure: LEFT HEART CATHETERIZATION WITH Beatrix Fetters;  Surgeon: Jolaine Artist, MD;  Location: [facility]  LAB;  Service: Cardiovascular;  Laterality: N/A;  . LEFT HEART CATHETERIZATION WITH CORONARY/GRAFT ANGIOGRAM N/A 10/10/2011   Procedure: LEFT HEART CATHETERIZATION WITH Beatrix Fetters;  Surgeon: Peter M Martinique, MD;  Location: [facility] CATH LAB;  Service: Cardiovascular;  Laterality: N/A;  . NASAL SINUS SURGERY     twice  . SQUAMOUS CELL CARCINOMA EXCISION    . TUBAL LIGATION      Family History  Problem Relation Age of Onset  . Heart attack Mother 42  . Diabetes Mother   . Colon cancer Father 74  . Lung cancer Father        smoked  . Heart disease Father        angina  . Hypertension Father   . Cancer Father        colon, lung  . Lung cancer Sister        smoked  . Cancer Sister   . Hypothyroidism Sister   . Hypertension Brother   . Heart attack Brother   . Heart disease Brother        stent  . Heart attack Brother        CABG  . Heart disease Brother        CABG with 1 bypass  . Aneurysm Brother        brain  . Alcohol abuse Brother   . Hyperlipidemia Daughter   . Diabetes Maternal Grandmother   . Stroke Maternal Grandmother   . Cirrhosis Maternal Grandfather   . Stroke Paternal Grandmother   . Obesity Daughter   . Obesity Son     Social History   Socioeconomic History  . Marital status: Married    Spouse name: Not on file  . Number of children: 5  . Years of education: Not on file  . Highest education level: Not on file  Occupational History  . 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 Financial resource strain: Not on file  . Food insecurity:    Worry: Not on file    Inability: Not on file  . Transportation needs:    Medical: Not on file    Non-medical: Not on file  Tobacco Use  . Smoking status: Former Smoker    Packs/day: 0.50    Years: 40.00    Pack years: 20.00    Types: Cigarettes    Last attempt to quit: 01/19/1993    Years since quitting: 24.9  . Smokeless tobacco: Never Used  Substance and  Sexual Activity  . Alcohol use: Yes    Comment: 01/02/2016 "might drink a glass of wine socially q 3-4 months"  . Drug use: No  . Sexual activity: Not Currently    Birth control/protection: Post-menopausal  Lifestyle  .  Physical activity:    Days per week: Not on file    Minutes per session: Not on file  . Stress: Not on file  Relationships  . Social connections:    Talks on phone: Not on file    Gets together: Not on file    Attends religious service: Not on file    Active member of club or organization: Not on file    Attends meetings of clubs or organizations: Not on file    Relationship status: Not on file  . Intimate partner violence:    Fear of current or ex partner: Not on file    Emotionally abused: Not on file    Physically abused: Not on file    Forced sexual activity: Not on file  Other Topics Concern  . Not on file  Social History Narrative   Married with 5 children    Outpatient Medications Prior to Visit  Medication Sig Dispense Refill  . acetaminophen (TYLENOL) 650 MG CR tablet Take 1,300 mg by mouth 2 (two) times daily.    Marland Kitchen albuterol (PROVENTIL HFA;VENTOLIN HFA) 108 (90 Base) MCG/ACT inhaler Inhale 2 puffs into the lungs every 6 (six) hours as needed for wheezing. 1 Inhaler 11  . budesonide-formoterol (SYMBICORT) 160-4.5 MCG/ACT inhaler INHALE 2 PUFFS INTO THE LUNGS 2 (TWO) TIMES DAILY. 10.2 Inhaler 4  . Calcium Carbonate-Vitamin D (CALCIUM + D PO) Take 1 tablet by mouth 2 (two) times daily.    . clopidogrel (PLAVIX) 75 MG tablet TAKE 1 TABLET BY MOUTH EVERY DAY 90 tablet 0  . fluticasone (FLONASE) 50 MCG/ACT nasal spray Place 2 sprays into both nostrils daily. For congestion 48 g 3  . furosemide (LASIX) 20 MG tablet TAKE 1 TABLET (20 MG TOTAL) BY MOUTH DAILY AS NEEDED. 90 tablet 3  . HYDROcodone-acetaminophen (NORCO) 10-325 MG tablet Take 1 tablet by mouth every 6 (six) hours as needed. Back pain 20 tablet 0  . isosorbide mononitrate (IMDUR) 60 MG 24 hr tablet  Take 1 tablet (60 mg total) by mouth 2 (two) times daily. 180 tablet 3  . isosorbide mononitrate (IMDUR) 60 MG 24 hr tablet TAKE 1 TABLET (60 MG TOTAL) BY MOUTH 2 (TWO) TIMES DAILY. 180 tablet 1  . Krill Oil 1000 MG CAPS Take 1,000 mg by mouth daily.    Marland Kitchen levothyroxine (SYNTHROID, LEVOTHROID) 25 MCG tablet TAKE 1 TABLET (25 MCG TOTAL) BY MOUTH DAILY BEFORE BREAKFAST. 90 tablet 3  . losartan (COZAAR) 50 MG tablet Take 1 tablet (50 mg total) by mouth daily. 30 tablet 11  . nitroGLYCERIN (NITROSTAT) 0.4 MG SL tablet Place 1 tablet (0.4 mg total) under the tongue every 5 (five) minutes as needed for chest pain. 25 tablet 3  . Polyethyl Glycol-Propyl Glycol (SYSTANE OP) Place 1 drop into both eyes 3 (three) times daily as needed (dry eyes).     . potassium chloride (K-DUR) 10 MEQ tablet Take 1 tablet (10 mEq total) by mouth daily as needed. (Patient taking differently: Take 10-20 mEq by mouth daily as needed (for cramping/lab results.). ) 90 tablet 3  . rosuvastatin (CRESTOR) 40 MG tablet TAKE 1 TABLET (40 MG TOTAL) BY MOUTH DAILY. 90 tablet 3  . sertraline (ZOLOFT) 50 MG tablet TAKE 1 TABLET BY MOUTH EVERY DAY 90 tablet 1  . Spacer/Aero-Holding Chambers DEVI 1 spacer to use prn with HFA device as directed 1 each 0  . tiotropium (SPIRIVA HANDIHALER) 18 MCG inhalation capsule INHALE 1 CAPSULE VIA HANDIHALER ONCE DAILY AT  THE SAME TIME EVERY DAY 30 capsule 3  . tiotropium (SPIRIVA) 18 MCG inhalation capsule Place 18 mcg into inhaler and inhale daily.    . Vitamin D, Ergocalciferol, (DRISDOL) 50000 units CAPS capsule TAKE 1 CAPSULE (50,000 UNITS TOTAL) BY MOUTH EVERY 7 (SEVEN) DAYS. 4 capsule 4  . albuterol (PROAIR HFA) 108 (90 Base) MCG/ACT inhaler NHALE 2 PUFFS INTO THE LUNGS EVERY 6 (SIX) HOURS AS NEEDED FOR WHEEZING. 1 Inhaler 1  . famotidine (PEPCID) 20 MG tablet Take 1 tablet (20 mg total) by mouth at bedtime. 60 tablet 5  . metoprolol tartrate (LOPRESSOR) 25 MG tablet Take 1 tablet (25 mg total) by  mouth 2 (two) times daily. 180 tablet 3  . dexlansoprazole (DEXILANT) 60 MG capsule Take 1 capsule (60 mg total) by mouth daily. 90 capsule 0   No facility-administered medications prior to visit.     Allergies  Allergen Reactions  . Codeine Nausea And Vomiting    REACTION: nausea/vomiting  . Erythromycin Other (See Comments)    REACTION: tongue burns  . Meperidine Hcl Nausea And Vomiting    REACTION: Nausea/vomiting  . Shellfish Allergy Nausea And Vomiting  . Ciprofloxacin Rash    REACTION: rash IV  . Penicillins Rash    Has patient had a PCN reaction causing immediate rash, facial/tongue/throat swelling, SOB or lightheadedness with hypotension:unsure Has patient had a PCN reaction causing severe rash involving mucus membranes or skin necrosis:No Has patient had a PCN reaction that required hospitalization:No Has patient had a PCN reaction occurring within the last 10 years:No If all of the above answers are "NO", then may proceed with Cephalosporin use.   . Tape Rash    Review of Systems  Constitutional: Negative for chills, fever and malaise/fatigue.  HENT: Negative for congestion and hearing loss.   Eyes: Negative for discharge.  Respiratory: Positive for cough and shortness of breath. Negative for sputum production.   Cardiovascular: Negative for chest pain, palpitations and leg swelling.  Gastrointestinal: Positive for heartburn. Negative for abdominal pain, blood in stool, constipation, diarrhea, nausea and vomiting.  Genitourinary: Negative for dysuria, frequency, hematuria and urgency.  Musculoskeletal: Positive for joint pain. Negative for back pain, falls and myalgias.  Skin: Negative for rash.  Neurological: Negative for dizziness, sensory change, loss of consciousness, weakness and headaches.  Endo/Heme/Allergies: Negative for environmental allergies. Does not bruise/bleed easily.  Psychiatric/Behavioral: Negative for depression and suicidal ideas. The patient is  not nervous/anxious and does not have insomnia.        Objective:    Physical Exam  Constitutional: She is oriented to person, place, and time. No distress.  HENT:  Head: Normocephalic and atraumatic.  Right Ear: External ear normal.  Left Ear: External ear normal.  Nose: Nose normal.  Mouth/Throat: Oropharynx is clear and moist. No oropharyngeal exudate.  Eyes: Pupils are equal, round, and reactive to light. Conjunctivae are normal. Right eye exhibits no discharge. Left eye exhibits no discharge. No scleral icterus.  Neck: Normal range of motion. Neck supple. No thyromegaly present.  Cardiovascular: Normal rate, regular rhythm and intact distal pulses.  Murmur heard. Pulmonary/Chest: Effort normal and breath sounds normal. No respiratory distress. She has no wheezes. She has no rales.  Abdominal: Soft. Bowel sounds are normal. She exhibits no distension and no mass. There is no tenderness.  Musculoskeletal: Normal range of motion. She exhibits no edema or tenderness.  Lymphadenopathy:    She has no cervical adenopathy.  Neurological: She is alert and oriented to person,  place, and time. She has normal reflexes. She displays normal reflexes. No cranial nerve deficit. Coordination normal.  Skin: Skin is warm and dry. No rash noted. She is not diaphoretic.    BP 122/84 (BP Location: Left Arm, Patient Position: Sitting, Cuff Size: Normal)   Pulse (!) 51   Temp 98.1 F (36.7 C) (Oral)   Resp 18   Ht 5\' 2"  (1.575 m)   Wt 145 lb 9.6 oz (66 kg)   SpO2 93%   BMI 26.63 kg/m  Wt Readings from Last 3 Encounters:  12/01/17 145 lb 9.6 oz (66 kg)  08/05/17 149 lb 9.6 oz (67.9 kg)  07/17/17 147 lb (66.7 kg)   BP Readings from Last 3 Encounters:  12/01/17 122/84  08/05/17 (!) 149/87  07/17/17 94/60     Immunization History  Administered Date(s) Administered  . Influenza,inj,Quad PF,6+ Mos 04/15/2016  . Pneumococcal Conjugate-13 01/24/2014  . Pneumococcal Polysaccharide-23  05/25/2002  . Td 05/24/1999  . Tdap 01/24/2014    Health Maintenance  Topic Date Due  . PNA vac Low Risk Adult (2 of 2 - PPSV23) 01/25/2015  . INFLUENZA VACCINE  12/24/2017  . TETANUS/TDAP  01/25/2024  . DEXA SCAN  Completed    Lab Results  Component Value Date   WBC 6.5 12/01/2017   HGB 14.5 12/01/2017   HCT 42.7 12/01/2017   PLT 160.0 12/01/2017   GLUCOSE 89 12/01/2017   CHOL 148 12/01/2017   TRIG 166.0 (H) 12/01/2017   HDL 36.50 (L) 12/01/2017   LDLDIRECT 74.0 09/30/2016   LDLCALC 78 12/01/2017   ALT 11 12/01/2017   AST 16 12/01/2017   NA 143 12/01/2017   K 4.3 12/01/2017   CL 103 12/01/2017   CREATININE 0.87 12/01/2017   BUN 13 12/01/2017   CO2 33 (H) 12/01/2017   TSH 2.77 12/01/2017   INR 1.0 06/17/2016   HGBA1C 6.2 12/01/2017   MICROALBUR 6.9 (H) 12/01/2017    Lab Results  Component Value Date   TSH 2.77 12/01/2017   Lab Results  Component Value Date   WBC 6.5 12/01/2017   HGB 14.5 12/01/2017   HCT 42.7 12/01/2017   MCV 83.5 12/01/2017   PLT 160.0 12/01/2017   Lab Results  Component Value Date   NA 143 12/01/2017   K 4.3 12/01/2017   CO2 33 (H) 12/01/2017   GLUCOSE 89 12/01/2017   BUN 13 12/01/2017   CREATININE 0.87 12/01/2017   BILITOT 0.6 12/01/2017   ALKPHOS 85 12/01/2017   AST 16 12/01/2017   ALT 11 12/01/2017   PROT 7.0 12/01/2017   ALBUMIN 4.4 12/01/2017   CALCIUM 9.8 12/01/2017   ANIONGAP 8 06/25/2016   GFR 66.81 12/01/2017   Lab Results  Component Value Date   CHOL 148 12/01/2017   Lab Results  Component Value Date   HDL 36.50 (L) 12/01/2017   Lab Results  Component Value Date   LDLCALC 78 12/01/2017   Lab Results  Component Value Date   TRIG 166.0 (H) 12/01/2017   Lab Results  Component Value Date   CHOLHDL 4 12/01/2017   Lab Results  Component Value Date   HGBA1C 6.2 12/01/2017         Assessment & Plan:   Problem List Items Addressed This Visit    Hyperlipidemia    Tolerating statin, encouraged heart  healthy diet, avoid trans fats, minimize simple carbs and saturated fats. Increase exercise as tolerated      Relevant Orders   Lipid panel (  Completed)   HTN (hypertension)    Well controlled, no changes to meds. Encouraged heart healthy diet such as the DASH diet and exercise as tolerated.       Relevant Orders   CBC (Completed)   Comprehensive metabolic panel (Completed)   TSH (Completed)   Microalbumin / creatinine urine ratio (Completed)   GERD    Restarted Famotidine to 20 mg daily and can increase to twice daily      Relevant Medications   famotidine (PEPCID) 20 MG tablet   Osteoporosis    Encouraged to get adequate exercise, calcium and vitamin d intake      Shortness of breath    Worsening with exertion, has been seeing pulmonology and gastroenterology with no symptom control. Has notable valvular heart disease. Is referred back to her cardiologist and set up for repeat echo.       Breast cancer screening    MGM ordered, no concerns today      Relevant Orders   MM 3D SCREEN BREAST BILATERAL   Upper airway cough syndrome    Still coughing some but improved with current meds.       Hyperglycemia    hgba1c acceptable, minimize simple carbs. Increase exercise as tolerated. Continue current meds      Relevant Orders   Hemoglobin A1c (Completed)   Mitral valve disease   Relevant Orders   ECHOCARDIOGRAM COMPLETE   Ambulatory referral to Cardiology   Hypothyroidism    On Levothyroxine, continue to monitor      Relevant Orders   TSH (Completed)   Cervical cancer screening    Referred to GYN for ongoing care.       Relevant Orders   Ambulatory referral to Obstetrics / Gynecology   Annual physical exam    Patient encouraged to maintain heart healthy diet, regular exercise, adequate sleep. Consider daily probiotics. Take medications as prescribed. Labs reviewed and ordered. Referred to GI for repeat colonoscopy due to [place] of colon cancer. MGM ordered, referred  to GYN for pap. Encouraged to get Shingrix at pharmacy and Pneumovax given today       Other Visit Diagnoses    DOE (dyspnea on exertion)    -  Primary   Relevant Orders   ECHOCARDIOGRAM COMPLETE   Ambulatory referral to Cardiology      I have discontinued Andrea [name] dexlansoprazole. I am also having her maintain her Calcium Carbonate-Vitamin D (CALCIUM + D PO), acetaminophen, Polyethyl Glycol-Propyl Glycol (SYSTANE OP), Krill Oil, potassium chloride, Spacer/Aero-Holding Chambers, albuterol, isosorbide mononitrate, fluticasone, metoprolol tartrate, HYDROcodone-acetaminophen, losartan, nitroGLYCERIN, tiotropium, sertraline, levothyroxine, furosemide, rosuvastatin, budesonide-formoterol, isosorbide mononitrate, Vitamin D (Ergocalciferol), tiotropium, clopidogrel, and famotidine.  Meds ordered this encounter  Medications  . DISCONTD: famotidine (PEPCID) 20 MG tablet    Sig: Take 1 tablet (20 mg total) by mouth at bedtime.    Dispense:  90 tablet    Refill:  5  . famotidine (PEPCID) 20 MG tablet    Sig: Take 1 tablet (20 mg total) by mouth at bedtime.    Dispense:  90 tablet    Refill:  3    CMA served as scribe during this visit. History, Physical and Plan performed by medical provider. Documentation and orders reviewed and attested to.  [name], MD

## 2017-12-01 NOTE — Assessment & Plan Note (Addendum)
MGM ordered, no concerns today

## 2017-12-01 NOTE — Assessment & Plan Note (Signed)
Well controlled, no changes to meds. Encouraged heart healthy diet such as the DASH diet and exercise as tolerated.  °

## 2017-12-01 NOTE — Assessment & Plan Note (Signed)
Tolerating statin, encouraged heart healthy diet, avoid trans fats, minimize simple carbs and saturated fats. Increase exercise as tolerated 

## 2017-12-01 NOTE — Assessment & Plan Note (Signed)
On Levothyroxine, continue to monitor 

## 2017-12-01 NOTE — Assessment & Plan Note (Signed)
hgba1c acceptable, minimize simple carbs. Increase exercise as tolerated. Continue current meds 

## 2017-12-01 NOTE — Assessment & Plan Note (Signed)
Restarted Famotidine to 20 mg daily and can increase to twice daily

## 2017-12-02 ENCOUNTER — Other Ambulatory Visit: Payer: PPO

## 2017-12-02 LAB — LIPID PANEL
CHOLESTEROL: 148 mg/dL (ref 0–200)
HDL: 36.5 mg/dL — ABNORMAL LOW (ref >39.00)
LDL Cholesterol: 78 mg/dL (ref 0–99)
NonHDL: 111.67
TRIGLYCERIDES: 166 mg/dL — AB (ref 0.0–149.0)
Total CHOL/HDL Ratio: 4
VLDL: 33.2 mg/dL (ref 0.0–40.0)

## 2017-12-02 LAB — COMPREHENSIVE METABOLIC PANEL
ALBUMIN: 4.4 g/dL (ref 3.5–5.2)
ALK PHOS: 85 U/L (ref 39–117)
ALT: 11 U/L (ref 0–35)
AST: 16 U/L (ref 0–37)
BUN: 13 mg/dL (ref 6–23)
CALCIUM: 9.8 mg/dL (ref 8.4–10.5)
CO2: 33 mEq/L — ABNORMAL HIGH (ref 19–32)
Chloride: 103 mEq/L (ref 96–112)
Creatinine, Ser: 0.87 mg/dL (ref 0.40–1.20)
GFR: 66.81 mL/min (ref >60.00)
Glucose, Bld: 89 mg/dL (ref 70–99)
POTASSIUM: 4.3 meq/L (ref 3.5–5.1)
SODIUM: 143 meq/L (ref 135–145)
TOTAL PROTEIN: 7 g/dL (ref 6.0–8.3)
Total Bilirubin: 0.6 mg/dL (ref 0.2–1.2)

## 2017-12-02 LAB — TSH: TSH: 2.77 u[IU]/mL (ref 0.35–4.50)

## 2017-12-02 LAB — CBC
HEMATOCRIT: 42.7 % (ref 36.0–46.0)
HEMOGLOBIN: 14.5 g/dL (ref 12.0–15.0)
MCHC: 33.9 g/dL (ref 30.0–36.0)
MCV: 83.5 fl (ref 78.0–100.0)
PLATELETS: 160 10*3/uL (ref 150.0–400.0)
RBC: 5.11 Mil/uL (ref 3.87–5.11)
RDW: 15.8 % — ABNORMAL HIGH (ref 11.5–15.5)
WBC: 6.5 10*3/uL (ref 4.0–10.5)

## 2017-12-02 LAB — HEMOGLOBIN A1C: Hgb A1c MFr Bld: 6.2 % (ref 4.6–6.5)

## 2017-12-06 ENCOUNTER — Other Ambulatory Visit: Payer: Self-pay | Admitting: Pulmonary Disease

## 2017-12-09 DIAGNOSIS — Z124 Encounter for screening for malignant neoplasm of cervix: Secondary | ICD-10-CM | POA: Insufficient documentation

## 2017-12-09 DIAGNOSIS — Z0000 Encounter for general adult medical examination without abnormal findings: Secondary | ICD-10-CM | POA: Insufficient documentation

## 2017-12-09 NOTE — Assessment & Plan Note (Signed)
Still coughing some but improved with current meds.

## 2017-12-09 NOTE — Assessment & Plan Note (Signed)
Patient encouraged to maintain heart healthy diet, regular exercise, adequate sleep. Consider daily probiotics. Take medications as prescribed. Labs reviewed and ordered. Referred to GI for repeat colonoscopy due to [place] of colon cancer. MGM ordered, referred to GYN for pap. Encouraged to get Shingrix at pharmacy and Pneumovax given today

## 2017-12-09 NOTE — Assessment & Plan Note (Signed)
Encouraged to get adequate exercise, calcium and vitamin d intake 

## 2017-12-09 NOTE — Assessment & Plan Note (Signed)
Referred to GYN for ongoing care.

## 2017-12-10 ENCOUNTER — Ambulatory Visit (HOSPITAL_BASED_OUTPATIENT_CLINIC_OR_DEPARTMENT_OTHER)
Admission: RE | Admit: 2017-12-10 | Discharge: 2017-12-10 | Disposition: A | Discharge status: Home / Self Care | Payer: PPO | Source: Ambulatory Visit | Attending: Family Medicine | Admitting: Family Medicine

## 2017-12-10 DIAGNOSIS — Z951 Presence of aortocoronary bypass graft: Secondary | ICD-10-CM | POA: Diagnosis not present

## 2017-12-10 DIAGNOSIS — I059 Rheumatic mitral valve disease, unspecified: Secondary | ICD-10-CM

## 2017-12-10 DIAGNOSIS — I081 Rheumatic disorders of both mitral and tricuspid valves: Secondary | ICD-10-CM | POA: Insufficient documentation

## 2017-12-10 DIAGNOSIS — I2510 Atherosclerotic heart disease of native coronary artery without angina pectoris: Secondary | ICD-10-CM | POA: Insufficient documentation

## 2017-12-10 DIAGNOSIS — I272 Pulmonary hypertension, unspecified: Secondary | ICD-10-CM | POA: Insufficient documentation

## 2017-12-10 DIAGNOSIS — R0600 Dyspnea, unspecified: Secondary | ICD-10-CM

## 2017-12-10 DIAGNOSIS — Z1231 Encounter for screening mammogram for malignant neoplasm of breast: Secondary | ICD-10-CM | POA: Diagnosis not present

## 2017-12-10 DIAGNOSIS — Z1239 Encounter for other screening for malignant neoplasm of breast: Secondary | ICD-10-CM

## 2017-12-10 DIAGNOSIS — R0609 Other forms of dyspnea: Secondary | ICD-10-CM | POA: Insufficient documentation

## 2017-12-10 DIAGNOSIS — J449 Chronic obstructive pulmonary disease, unspecified: Secondary | ICD-10-CM | POA: Diagnosis not present

## 2017-12-10 NOTE — Progress Notes (Signed)
  Echocardiogram 2D Echocardiogram has been performed.  Andrea [name] T Andrea [name] 12/10/2017, 4:17 PM

## 2017-12-11 NOTE — Telephone Encounter (Signed)
Dr. Carlean Purl reviewed records and stated "okay to schedule OV with Dr. Tarri Glenn"   Called and spoke to patient and she will call back to schedule

## 2017-12-13 ENCOUNTER — Other Ambulatory Visit: Payer: Self-pay | Admitting: Family Medicine

## 2017-12-13 DIAGNOSIS — I517 Cardiomegaly: Secondary | ICD-10-CM

## 2017-12-13 DIAGNOSIS — J449 Chronic obstructive pulmonary disease, unspecified: Secondary | ICD-10-CM

## 2017-12-18 ENCOUNTER — Telehealth: Payer: Self-pay | Admitting: Family Medicine

## 2017-12-18 NOTE — Telephone Encounter (Signed)
Copied from [place] [id number]. Topic: Quick Communication - See Telephone Encounter >> Dec 18, 2017  3:58 PM Andrea [name] wrote: CRM for notification. See Telephone encounter for: 12/18/17.  Patient states she started taking famotidine (PEPCID) 20 MG tablet last Tuesday 7/16 and it is not working. Please advise. She said that Dr Charlett Blake would give her something in place of it if it did not work, she uses [facility] on [address]

## 2017-12-21 NOTE — Telephone Encounter (Signed)
Spoke with patient she states she has only been taking medication once daily. Advised her to take the Pepcid bid she states she will try it and let us know if it works better.

## 2017-12-21 NOTE — Telephone Encounter (Signed)
Please advise 

## 2017-12-21 NOTE — Telephone Encounter (Signed)
7-9 note reviewed. Make sure we are taking twice daily. If so, we can try omeprazole 20 mg bid in place of it. TY.

## 2017-12-25 ENCOUNTER — Telehealth: Payer: Self-pay | Admitting: Cardiovascular Disease

## 2017-12-25 NOTE — Telephone Encounter (Signed)
I spoke with the patient. She has history of chronic bradycardia/ PVD/ CABG. She states she started to notice a frequent variation in her HR starting yesterday- from 42-93 bpm. Today she has continued to notice this with an episode of associated nausea eariler today. Confirmed with the patient that she is currently taking metoprolol tartrate 25 mg once daily in the morning. BP has been running ok around 119/76. She did take metoprolol this morning. She states she just feels weak right now.  I have reviewed the patient's symptoms with Christell [facility], PA. He advised that the patient: 1) report to the ER if she continues to feel bad 2) if she feels ok and feels like she can wait until Monday, she will need to come in for a nurse visit EKG and possible 48 hour holter. 3) she needs to change metoprolol tartate 25 mg to take 1/2 tablet (12.5 mg) BID- she is to hold her dose if her HR is < 65 bpm.  I have notified the patient of the above. She is agreeable and voices understanding.

## 2017-12-25 NOTE — Telephone Encounter (Signed)
I left a message for the patient to call back 

## 2017-12-25 NOTE — Telephone Encounter (Signed)
Patient calling Has been experiencing nausea, back pain and sweating Had an ECHO in [facility] recently and some changes were noticed Patient is scheduled to see Dr. Rockey Situ on 8/19 but would like to be seen sooner if possible Please call to discuss

## 2017-12-25 NOTE — Telephone Encounter (Signed)
Patient returning call.

## 2017-12-28 ENCOUNTER — Ambulatory Visit (INDEPENDENT_AMBULATORY_CARE_PROVIDER_SITE_OTHER): Payer: PPO

## 2017-12-28 ENCOUNTER — Ambulatory Visit (INDEPENDENT_AMBULATORY_CARE_PROVIDER_SITE_OTHER): Payer: PPO | Admitting: *Deleted

## 2017-12-28 VITALS — BP 122/90 | HR 58 | Ht 62.0 in | Wt 144.5 lb

## 2017-12-28 DIAGNOSIS — I499 Cardiac arrhythmia, unspecified: Secondary | ICD-10-CM | POA: Diagnosis not present

## 2017-12-28 NOTE — Patient Instructions (Signed)
Medication Instructions: - Your physician recommends that you continue on your current medications as directed. Please refer to the Current Medication list given to you today.  Labwork: - none ordered  Procedures/Testing: - Your physician has recommended that you wear a 48 hour holter monitor. Holter monitors are medical devices that record the heart's electrical activity. Doctors most often use these monitors to diagnose arrhythmias. Arrhythmias are problems with the speed or rhythm of the heartbeat. The monitor is a small, portable device. You can wear one while you do your normal daily activities. This is usually used to diagnose what is causing palpitations/syncope (passing out).  Follow-Up: - as scheduled with Dr. Rockey Situ   Any Additional Special Instructions Will Be Listed Below (If Applicable).     If you need a refill on your cardiac medications before your next appointment, please call your pharmacy.
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
## 2017-12-29 ENCOUNTER — Encounter: Payer: Self-pay | Admitting: *Deleted

## 2017-12-29 DIAGNOSIS — Z006 Encounter for examination for normal comparison and control in clinical research program: Secondary | ICD-10-CM

## 2017-12-29 NOTE — Progress Notes (Signed)
LEADERS [employer] II Research study month 24 telephone follow up completed. She states she has been doing well. She experienced some fluctuation in her heart rate Friday. She currently has Holter monitor on. She denies chest pain and no changes in her medication. Next research required visit is due next July. I thanked her for her participation.

## 2017-12-30 ENCOUNTER — Ambulatory Visit (INDEPENDENT_AMBULATORY_CARE_PROVIDER_SITE_OTHER): Payer: PPO | Admitting: Obstetrics & Gynecology

## 2017-12-30 ENCOUNTER — Encounter: Payer: Self-pay | Admitting: Obstetrics & Gynecology

## 2017-12-30 VITALS — BP 121/71 | HR 58 | Ht 62.0 in | Wt 144.0 lb

## 2017-12-30 DIAGNOSIS — Z01419 Encounter for gynecological examination (general) (routine) without abnormal findings: Secondary | ICD-10-CM | POA: Diagnosis not present

## 2017-12-30 DIAGNOSIS — N952 Postmenopausal atrophic vaginitis: Secondary | ICD-10-CM

## 2017-12-30 DIAGNOSIS — N898 Other specified noninflammatory disorders of vagina: Secondary | ICD-10-CM

## 2017-12-30 NOTE — Patient Instructions (Signed)
Atrophic Vaginitis Atrophic vaginitis is a condition in which the tissues that line the vagina become dry and thin. This condition is most common in women who have stopped having regular menstrual periods (menopause). This usually starts when a woman is 45-79 years old. Estrogen helps to keep the vagina moist. It stimulates the vagina to produce a clear fluid that lubricates the vagina for sexual intercourse. This fluid also protects the vagina from infection. Lack of estrogen can cause the lining of the vagina to get thinner and dryer. The vagina may also shrink in size. It may become less elastic. Atrophic vaginitis tends to get worse over time as a woman's estrogen level drops. What are the causes? This condition is caused by the normal drop in estrogen that happens around the time of menopause. What increases the risk? Certain conditions or situations may lower a woman's estrogen level, which increases her risk of atrophic vaginitis. These include:  Taking medicine that blocks estrogen.  Having ovaries removed surgically.  Being treated for cancer with X-ray treatment (radiation) or medicines (chemotherapy).  Exercising very hard and often.  Having an eating disorder (anorexia).  Giving birth or breastfeeding.  Being over the age of 50.  Smoking.  What are the signs or symptoms? Symptoms of this condition include:  Pain, soreness, or bleeding during sexual intercourse (dyspareunia).  Vaginal burning, irritation, or itching.  Pain or bleeding during a vaginal examination using a speculum (pelvic exam).  Loss of interest in sexual activity.  Having burning pain when passing urine.  Vaginal discharge that is brown or yellow.  In some cases, there are no symptoms. How is this diagnosed? This condition is diagnosed with a medical history and physical exam. This will include a pelvic exam that checks whether the inside of your vagina appears pale, thin, or dry. Rarely, you may  also have other tests, including:  A urine test.  A test that checks the acid balance in your vaginal fluid (acid balance test).  How is this treated? Treatment for this condition may depend on the severity of your symptoms. Treatment may include:  Using an over-the-counter vaginal lubricant before you have sexual intercourse.  Using a long-acting vaginal moisturizer.  Using low-dose vaginal estrogen for moderate to severe symptoms that do not respond to other treatments. Options include creams, tablets, and inserts (vaginal rings). Before using vaginal estrogen, tell your health care provider if you have a history of: ? Breast cancer. ? Endometrial cancer. ? Blood clots.  Taking medicines. You may be able to take a daily pill for dyspareunia. Discuss all of the risks of this medicine with your health care provider. It is usually not recommended for women who have a family history or personal history of breast cancer.  If your symptoms are very mild and you are not sexually active, you may not need treatment. Follow these instructions at home:  Take medicines only as directed by your health care provider. Do not use herbal or alternative medicines unless your health care provider says that you can.  Use over-the-counter creams, lubricants, or moisturizers for dryness only as directed by your health care provider.  If your atrophic vaginitis is caused by menopause, discuss all of your menopausal symptoms and treatment options with your health care provider.  Do not douche.  Do not use products that can make your vagina dry. These include: ? Scented feminine sprays. ? Scented tampons. ? Scented soaps.  If it hurts to have sex, talk with your sexual   partner. Contact a health care provider if:  Your discharge looks different than normal.  Your vagina has an unusual smell.  You have new symptoms.  Your symptoms do not improve with treatment.  Your symptoms get worse. This  information is not intended to replace advice given to you by your health care provider. Make sure you discuss any questions you have with your health care provider. Document Released: 09/26/2014 Document Revised: 10/18/2015 Document Reviewed: 05/03/2014 Elsevier Interactive Patient Education  2018 Elsevier Inc.  

## 2017-12-30 NOTE — Progress Notes (Signed)
Subjective:     Andrea [name] is a 77 y.o. female here for a routine exam. [id number].  LMP 1980's Current complaints: Pt c/o a vaginal itch. Pt reports occ leakage with cough or laughing. She wears panty liner pads at all times if she out. Pt leaks 2-3x per day. She does consider this a problem due to the fear of body odor. Pt has noted sx for 1 year.    Pt drinks 1-2 cups of coffee daily 8-12 oz per cup.          Gynecologic History No LMP recorded. Patient is postmenopausal. Contraception: post menopausal status Last Pap: >15 years prev. Pt denies h/o abnormal  PAP ever.  Last mammogram: 12/10/2017. Results were: normal  Obstetric History OB History  Gravida Para Term Preterm AB Living  6 5 3 2 1 5   SAB TAB Ectopic Multiple Live Births  1       5    # Outcome Date GA Lbr Len/2nd Weight Sex Delivery Anes PTL Lv  6 Term 1976 [redacted]w[redacted]d   M  EPI  LIV  5 Term 20 105w0d   F Vag-Spont EPI  LIV  4 SAB 1971          3 Preterm 1966 [redacted]w[redacted]d   M Vag-Spont None  LIV  2 Term 1963 [redacted]w[redacted]d   F Vag-Spont None  LIV  1 Preterm 12 [redacted]w[redacted]d   M Vag-Spont   LIV   The following portions of the patient's history were reviewed and updated as appropriate: allergies, current medications, past family history, past medical history, past social history, past surgical history and problem list.  Review of Systems Pertinent items are noted in HPI.    Objective:  BP 121/71   Pulse (!) 58   Ht 5\' 2"  (1.575 m)   Wt 144 lb 0.6 oz (65.3 kg)   BMI 26.35 kg/m   General Appearance:    Alert, cooperative, no distress, appears stated age  Head:    Normocephalic, without obvious abnormality, atraumatic  Eyes:    conjunctiva/corneas clear, EOM's intact, both eyes  Ears:    Normal external ear canals, both ears  Nose:   Nares normal, septum midline, mucosa normal, no drainage    or sinus tenderness  Throat:   Lips, mucosa, and tongue normal; teeth and gums normal  Neck:   Supple, symmetrical, trachea midline, no adenopathy;    thyroid:  no enlargement/tenderness/nodules  Back:     Symmetric, no curvature, ROM normal, no CVA tenderness  Lungs:     Clear to auscultation bilaterally, respirations unlabored  Chest Wall:    No tenderness or deformity   Heart:    Regular rate and rhythm, S1 and S2 normal, no murmur, rub   or gallop  Breast Exam:    No tenderness, masses, or nipple abnormality  Abdomen:     Soft, non-tender, bowel sounds active all four quadrants,    no masses, no organomegaly  Genitalia:    Normal female without lesion, discharge or tenderness; vaginal mucosa- atrophic; pt declined q-tip test.       Extremities:   Extremities normal, atraumatic, no cyanosis or edema  Pulses:   2+ and symmetric all extremities  Skin:   Skin color, texture, turgor normal, no rashes or lesions     Assessment:    Healthy female exam.   stress urinary incontinence- pt drinks caffeine and does not want to limit her caffeine. Pt does not want to go to PT.  Pt is not interested in surgery. Declined further testing   Atrophic vaginitis    Plan:   Yearly mammogram  F/u in 1 year Coconut oil bid   Aloysius Heinle L. Harraway-Smith, M.D., Cherlynn June

## 2018-01-01 ENCOUNTER — Ambulatory Visit
Admission: RE | Admit: 2018-01-01 | Discharge: 2018-01-01 | Disposition: A | Discharge status: Home / Self Care | Payer: PPO | Source: Ambulatory Visit | Attending: Physician Assistant | Admitting: Physician Assistant

## 2018-01-01 ENCOUNTER — Ambulatory Visit: Payer: PPO | Admitting: Pulmonary Disease

## 2018-01-01 DIAGNOSIS — I499 Cardiac arrhythmia, unspecified: Secondary | ICD-10-CM | POA: Diagnosis present

## 2018-01-01 DIAGNOSIS — I493 Ventricular premature depolarization: Secondary | ICD-10-CM | POA: Diagnosis not present

## 2018-01-01 DIAGNOSIS — I491 Atrial premature depolarization: Secondary | ICD-10-CM | POA: Diagnosis not present

## 2018-01-01 DIAGNOSIS — I471 Supraventricular tachycardia: Secondary | ICD-10-CM | POA: Diagnosis not present

## 2018-01-10 NOTE — Progress Notes (Deleted)
Cardiology Office Note  Date:  01/10/2018   ID:  Britley Gashi, DOB March 05, 1941, MRN [id number]  PCP:  Mosie Lukes, MD   No chief complaint on file.   HPI:  [name] is a pleasant 77 year old woman with history of  chronic bradycardia,  spinal stenosis,  peripheral vascular disease,  stents to her lower extremities status post CEA on the left, 40-59% residual disease on the right, 10/17 hyperlipidemia,  COPD, long smoking history for at least 30 years coronary artery disease and bypass surgery in 1999, at [facility] in [place] stent placement to distal RCA may 2013, In August 2017 she had 2 stents placed to vein graft to the RCA, also with stents to the mid left circumflex with resolution of her anginal symptoms Shepresents for routine followup Of her coronary artery disease .  She is part of the bio freedom study  In follow-up today she reports that she is doing well Concerned that at home her diastolic pressure is elevated typically high 80s to 90 She preferred to have blood pressure lower Denies any chest pain symptoms concerning for angina Denies any claudication type symptoms No shortness of breath on exertion, no leg swelling, no PND orthopnea Tolerating high-dose statin  EKG personally reviewed by myself on todays visit Shows normal sinus rhythm rate 60 bpm T-wave inversions V3 through V6, 2, 3, aVF  Other past medical history reviewed cardiac catheterization 06/25/2016 occluded vein graft to the RCA, 60% left ostial subclavian vessel disease Medical management   Echo 08/11/2016 Normal EF, moderately elevated RVSP 57 mm Hg, moderate to severe TR  Carotid 10/17 40 to 59% disease on the right, <39% on the left  Denies any significant anginal symptoms previously stopped her Crestor, cholesterol from 144 up to 242 in May 30. 2017 Now back on crestor LDL 64, total chol 126  Cardiac catheterization results 1. Significant underlying three-vessel coronary  artery disease with patent LIMA to LAD. The SVG to the RCA is now occluded proximally with left-to-right collaterals. The left circumflex stent is patent with no significant restenosis. 2. Mildly reduced LV systolic function with an EF of 50-55% and inferior wall hypokinesis. Mildly elevated left ventricular end-diastolic pressure. 3. Aortic arch angiogram was done to evaluate the stenosis in the left subclavian artery. It showed 60% heavily calcified stenosis in the ostium of the left subclavian artery. There was about 85-46 mm systolic gradient across the stenosis.   cardiac catheterization 01/02/2016 She had 2 stents placed to the vein graft to the RCA, also angioplasty with stent placement to the mid left circumflex Following intervention she feels much better, no chest pain, shortness of breath improved Previously was taking nitroglycerin for anginal symptoms, now no longer needing nitroglycerin Was taking NTG 5 x in one day  She has a history of stents to her lower extremities, these have not been visualized in many years  Cath 01/02/16 Significant underlying three-vessel coronary artery disease. Patent LIMA to LAD. However, there is retrograde flow in the graft itself likely due to no obstructive disease down the native LAD. The native LAD has diffuse 60-70% calcified disease in the proximal and midsegment. Severe new disease in SVG to RCA. Collaterals were noted from left-to-right. Worsening disease in the native left circumflex which does not have a patent bypass.  2. Moderate left subclavian stenosis which was not significant by gradients. 3. Normal left ventricular end-diastolic pressure. Left ventricular angiography was not performed. 4. Successful angioplasty and drug-eluting stent placement to the  SVG to RCA with 2 bio freedom stent placement. Successful angioplasty and stent placement to the mid left circumflex with 1 I freedom stent.  The SVG to RCA is an old graft with new  areas of disease. I think this graft is high risk for closure in spite of intervention today. The patient was enrolled in the bio freedom study and thus dual antiplatelet therapy is optional after one month. However, I recommend continuing Plavix indefinitely. The patient did have significant chest pain during SVG to RCA PCI with ST changes. By the end of the procedure, her chest pain resolved completely. However, mild ST elevation persisted possibly due to microvascular injury.   catheterization November 2012 Atretic LIMA to the LAD,  She has saphenous vein graft sequential to PDA and PL loss of distal limb of vein graft to the PDA and PL with left-to-right collaterals 40-50% proximal to mid LAD with focal 60-70% mid LAD 2 large OM's, 30% mid OM1, 40-50% ostial Ejection fraction 55% with inferobasal akinesis   PMH:   has a past medical history of AAA (abdominal aortic aneurysm) ([place]) (01/2009), Acute bronchitis (03/20/2013; 2017), Angina, Anosmia, Asthma, Baker's cyst of knee (01/30/2014), Basal cell carcinoma, Bradycardia, CAD (coronary artery disease), Cerebrovascular disease (01/2009), Chronic thoracic back pain, COPD (chronic obstructive pulmonary disease) ([place]), Depression, Dizziness, Dysrhythmia, GERD (gastroesophageal reflux disease), Heart murmur, Herniated lumbar disc without myelopathy, History of blood transfusion (1999), Hyperglycemia (10/23/2015), Hyperlipidemia, Hypertension, Hypothyroidism (09/30/2016), Medicare annual wellness visit, subsequent (07/31/2013), NSTEMI (non-ST elevated myocardial infarction) ([place]) (11/12), Osteoarthritis of back, Osteoporosis, Pneumonia ("several times"), Pulmonary embolism ([place]) (1999), PVD (peripheral vascular disease) ([place]), Shortness of breath, Squamous carcinoma, Thyroid disease, and Unstable angina ([place]) (09/25/2015).  PSH:    Past Surgical History:  Procedure Laterality Date  . ABDOMINAL AORTIC ANEURYSM REPAIR     pt denies this hx on 01/02/2016  .  BASAL CELL CARCINOMA EXCISION    . CARDIAC CATHETERIZATION N/A 01/02/2016   Procedure: Left Heart Cath and Coronary Angiography;  Surgeon: Wellington Hampshire, MD;  Location: [place] [facility];  Service: Cardiovascular;  Laterality: N/A;  . CARDIAC CATHETERIZATION  1996; 1999  . CARDIAC CATHETERIZATION N/A 06/25/2016   Procedure: Left Heart Cath and Cors/Grafts Angiography;  Surgeon: Wellington Hampshire, MD;  Location: [place] [facility];  Service: Cardiovascular;  Laterality: N/A;  . CAROTID ENDARTERECTOMY Left 01/02/2011  . CATARACT EXTRACTION W/ INTRAOCULAR LENS  IMPLANT, BILATERAL    . CLOSED REDUCTION NASAL FRACTURE  11/2007  . CORONARY ANGIOPLASTY WITH STENT PLACEMENT  10/10/2011   drug eluting  to rc & saphenous  . CORONARY ARTERY BYPASS GRAFT  1999   "CABG X3"  . DILATION AND CURETTAGE OF UTERUS    . FEMORAL ARTERY STENT Bilateral   . FRACTURE SURGERY    . LEFT HEART CATHETERIZATION WITH CORONARY/GRAFT ANGIOGRAM N/A 04/22/2011   Procedure: LEFT HEART CATHETERIZATION WITH Beatrix Fetters;  Surgeon: Jolaine Artist, MD;  Location: [facility] C[facility] LAB;  Service: Cardiovascular;  Laterality: N/A;  . LEFT HEART CATHETERIZATION WITH CORONARY/GRAFT ANGIOGRAM N/A 10/10/2011   Procedure: LEFT HEART CATHETERIZATION WITH Beatrix Fetters;  Surgeon: Peter M Martinique, MD;  Location: [facility] CATH LAB;  Service: Cardiovascular;  Laterality: N/A;  . NASAL SINUS SURGERY     twice  . SQUAMOUS CELL CARCINOMA EXCISION    . TUBAL LIGATION      Current Outpatient Medications  Medication Sig Dispense Refill  . acetaminophen (TYLENOL) 650 MG CR tablet Take 1,300 mg by mouth 2 (two)  times daily.    Marland Kitchen albuterol (PROVENTIL HFA;VENTOLIN HFA) 108 (90 Base) MCG/ACT inhaler Inhale 2 puffs into the lungs every 6 (six) hours as needed for wheezing. 1 Inhaler 11  . budesonide-formoterol (SYMBICORT) 160-4.5 MCG/ACT inhaler INHALE 2 PUFFS INTO THE LUNGS 2 (TWO) TIMES DAILY. 10.2 Inhaler 4  . Calcium Carbonate-Vitamin D  (CALCIUM + D PO) Take 1 tablet by mouth 2 (two) times daily.    . clopidogrel (PLAVIX) 75 MG tablet TAKE 1 TABLET BY MOUTH EVERY DAY 90 tablet 0  . famotidine (PEPCID) 20 MG tablet Take 1 tablet (20 mg) by mouth twice daily    . fluticasone (FLONASE) 50 MCG/ACT nasal spray Place 2 sprays into both nostrils daily. For congestion 48 g 3  . furosemide (LASIX) 20 MG tablet Take 1 tablet (20 mg) by mouth once a day on Mondays, Wednesdays, & Fridays    . HYDROcodone-acetaminophen (NORCO) 10-325 MG tablet Take 1 tablet by mouth every 6 (six) hours as needed. Back pain 20 tablet 0  . isosorbide mononitrate (IMDUR) 60 MG 24 hr tablet Take 1 tablet (60 mg total) by mouth 2 (two) times daily. 180 tablet 3  . Krill Oil 1000 MG CAPS Take 1,000 mg by mouth daily.    Marland Kitchen levothyroxine (SYNTHROID, LEVOTHROID) 25 MCG tablet TAKE 1 TABLET (25 MCG TOTAL) BY MOUTH DAILY BEFORE BREAKFAST. 90 tablet 3  . metoprolol tartrate (LOPRESSOR) 25 MG tablet Take 1/2 tablet (12.5 mg) by mouth twice daily- hold if HR < 65 bpm    . nitroGLYCERIN (NITROSTAT) 0.4 MG SL tablet Place 1 tablet (0.4 mg total) under the tongue every 5 (five) minutes as needed for chest pain. 25 tablet 3  . Polyethyl Glycol-Propyl Glycol (SYSTANE OP) Place 1 drop into both eyes 3 (three) times daily as needed (dry eyes).     . potassium chloride (K-DUR) 10 MEQ tablet Take 1 tablet (10 mEq total) by mouth daily as needed. (Patient taking differently: Take 10-20 mEq by mouth daily as needed (for cramping/lab results.). ) 90 tablet 3  . PROAIR HFA 108 (90 Base) MCG/ACT inhaler NHALE 2 PUFFS INTO THE LUNGS EVERY 6 (SIX) HOURS AS NEEDED FOR WHEEZING. 8.5 Inhaler 1  . rosuvastatin (CRESTOR) 40 MG tablet TAKE 1 TABLET (40 MG TOTAL) BY MOUTH DAILY. 90 tablet 3  . sertraline (ZOLOFT) 50 MG tablet TAKE 1 TABLET BY MOUTH EVERY DAY 90 tablet 1  . Spacer/Aero-Holding Chambers DEVI 1 spacer to use prn with HFA device as directed 1 each 0  . tiotropium (SPIRIVA HANDIHALER)  18 MCG inhalation capsule INHALE 1 CAPSULE VIA HANDIHALER ONCE DAILY AT THE SAME TIME EVERY DAY 30 capsule 3  . Vitamin D, Ergocalciferol, (DRISDOL) 50000 units CAPS capsule TAKE 1 CAPSULE (50,000 UNITS TOTAL) BY MOUTH EVERY 7 (SEVEN) DAYS. 4 capsule 4   No current facility-administered medications for this visit.      Allergies:   Codeine; Erythromycin; Meperidine hcl; Shellfish allergy; Ciprofloxacin; Penicillins; and Tape   Social History:  The patient  reports that she quit smoking about 24 years ago. Her smoking use included cigarettes. She has a 20.00 pack-year smoking history. She has never used smokeless tobacco. She reports that she drinks alcohol. She reports that she does not use drugs.   Family History:   family history includes Alcohol abuse in her brother; Aneurysm in her brother; Cancer in her father and sister; Cirrhosis in her maternal grandfather; Colon cancer (age of onset: 64) in her father; Diabetes in her  maternal grandmother and mother; Heart attack in her brother and brother; Heart attack (age of onset: 18) in her mother; Heart disease in her brother, brother, and father; Hyperlipidemia in her daughter; Hypertension in her brother and father; Hypothyroidism in her sister; Lung cancer in her father and sister; Obesity in her daughter and son; Stroke in her maternal grandmother and paternal grandmother.    Review of Systems: Review of Systems  Constitutional: Negative.   Respiratory: Positive for shortness of breath.   Cardiovascular: Negative.   Gastrointestinal: Negative.   Musculoskeletal: Negative.   Neurological: Negative.   Psychiatric/Behavioral: Negative.   All other systems reviewed and are negative.    PHYSICAL EXAM: VS:  There were no vitals taken for this visit. , BMI There is no height or weight on file to calculate BMI. GEN: Well nourished, well developed, in no acute distress  HEENT: normal  Neck: no JVD, carotid bruits, or masses Cardiac: RRR; no  murmurs, rubs, or gallops,no edema  Respiratory:  Moderately decreased breath sounds throughout,  normal work of breathing GI: soft, nontender, nondistended, + BS MS: no deformity or atrophy  Skin: warm and dry, no rash Neuro:  Strength and sensation are intact Psych: euthymic mood, full affect    Recent Labs: 12/01/2017: ALT 11; BUN 13; Creatinine, Ser 0.87; Hemoglobin 14.5; Platelets 160.0; Potassium 4.3; Sodium 143; TSH 2.77    Lipid Panel Lab Results  Component Value Date   CHOL 148 12/01/2017   HDL 36.50 (L) 12/01/2017   LDLCALC 78 12/01/2017   TRIG 166.0 (H) 12/01/2017      Wt Readings from Last 3 Encounters:  12/30/17 144 lb 0.6 oz (65.3 kg)  12/28/17 144 lb 8 oz (65.5 kg)  12/01/17 145 lb 9.6 oz (66 kg)       ASSESSMENT AND PLAN:  Mixed hyperlipidemia Cholesterol is at goal on the current lipid regimen. No changes to the medications were made.  Essential hypertension Discussed various treatment options for her blood pressure Recommended she start losartan 25 mg daily If blood pressure continues to run high would increase losartan up to 50 mg daily  Coronary artery disease involving coronary bypass graft of native heart with angina pectoris [facility]) Denies any anginal symptoms on exertion Previous cardiac catheterization results discussed with her  Bilateral carotid artery stenosis Endarterectomy on the left, 40-50% disease on the right Stressed importance of aggressive cholesterol control  Coronary artery disease of native artery of native heart with stable angina pectoris (HCC) Currently with no symptoms of angina. No further workup at this time. Continue current medication regimen. Currently stable  Pulmonary HTN underlying lung disease, 30 years of smoking She is taking Lasix several days per week Reports shortness of breath is stable  Non-rheumatic tricuspid valve insufficiency Moderate to severe TR on echo likely from elevated right heart  pressures  Chronic fatigue Recommended a regular exercise program for conditioning  Disposition:   F/U  12 months   Total encounter time more than 25 minutes  Greater than 50% was spent in counseling and coordination of care with the patient    No orders of the defined types were placed in this encounter.    Signed, Esmond Plants, M.D., Ph.D. 01/10/2018  [place], [place]

## 2018-01-11 ENCOUNTER — Other Ambulatory Visit: Payer: Self-pay | Admitting: Family Medicine

## 2018-01-11 ENCOUNTER — Encounter: Payer: Self-pay | Admitting: Emergency Medicine

## 2018-01-11 ENCOUNTER — Other Ambulatory Visit: Payer: Self-pay

## 2018-01-11 ENCOUNTER — Telehealth: Payer: Self-pay

## 2018-01-11 ENCOUNTER — Emergency Department
Admission: EM | Admit: 2018-01-11 | Discharge: 2018-01-11 | Disposition: A | Discharge status: Home / Self Care | Payer: PPO | Attending: Emergency Medicine | Admitting: Emergency Medicine

## 2018-01-11 ENCOUNTER — Emergency Department: Payer: PPO

## 2018-01-11 ENCOUNTER — Ambulatory Visit: Payer: PPO | Admitting: Cardiovascular Disease

## 2018-01-11 DIAGNOSIS — R42 Dizziness and giddiness: Secondary | ICD-10-CM | POA: Diagnosis present

## 2018-01-11 DIAGNOSIS — I10 Essential (primary) hypertension: Secondary | ICD-10-CM | POA: Diagnosis not present

## 2018-01-11 DIAGNOSIS — I252 Old myocardial infarction: Secondary | ICD-10-CM | POA: Diagnosis not present

## 2018-01-11 DIAGNOSIS — E039 Hypothyroidism, unspecified: Secondary | ICD-10-CM | POA: Insufficient documentation

## 2018-01-11 DIAGNOSIS — Z7902 Long term (current) use of antithrombotics/antiplatelets: Secondary | ICD-10-CM | POA: Diagnosis not present

## 2018-01-11 DIAGNOSIS — Z79899 Other long term (current) drug therapy: Secondary | ICD-10-CM | POA: Insufficient documentation

## 2018-01-11 DIAGNOSIS — Z87891 Personal history of nicotine dependence: Secondary | ICD-10-CM | POA: Diagnosis not present

## 2018-01-11 DIAGNOSIS — E785 Hyperlipidemia, unspecified: Secondary | ICD-10-CM | POA: Diagnosis not present

## 2018-01-11 DIAGNOSIS — I2510 Atherosclerotic heart disease of native coronary artery without angina pectoris: Secondary | ICD-10-CM | POA: Insufficient documentation

## 2018-01-11 DIAGNOSIS — R531 Weakness: Secondary | ICD-10-CM | POA: Diagnosis not present

## 2018-01-11 DIAGNOSIS — J449 Chronic obstructive pulmonary disease, unspecified: Secondary | ICD-10-CM | POA: Insufficient documentation

## 2018-01-11 DIAGNOSIS — R51 Headache: Secondary | ICD-10-CM | POA: Diagnosis not present

## 2018-01-11 LAB — BASIC METABOLIC PANEL
Anion gap: 8 (ref 5–15)
BUN: 14 mg/dL (ref 8–23)
CALCIUM: 9.3 mg/dL (ref 8.9–10.3)
CHLORIDE: 103 mmol/L (ref 98–111)
CO2: 31 mmol/L (ref 22–32)
CREATININE: 0.91 mg/dL (ref 0.44–1.00)
GFR, EST NON AFRICAN AMERICAN: 59 mL/min — AB (ref >60)
Glucose, Bld: 104 mg/dL — ABNORMAL HIGH (ref 70–99)
Potassium: 4.3 mmol/L (ref 3.5–5.1)
SODIUM: 142 mmol/L (ref 135–145)

## 2018-01-11 LAB — CBC
HCT: 43.3 % (ref 35.0–47.0)
Hemoglobin: 14.7 g/dL (ref 12.0–16.0)
MCH: 28.3 pg (ref 26.0–34.0)
MCHC: 34 g/dL (ref 32.0–36.0)
MCV: 83.3 fL (ref 80.0–100.0)
PLATELETS: 160 10*3/uL (ref 150–440)
RBC: 5.2 MIL/uL (ref 3.80–5.20)
RDW: 16 % — ABNORMAL HIGH (ref 11.5–14.5)
WBC: 6.4 10*3/uL (ref 3.6–11.0)

## 2018-01-11 LAB — TROPONIN I: Troponin I: <0.03 ng/mL (ref <0.03)

## 2018-01-11 MED ORDER — AMLODIPINE BESYLATE 5 MG PO TABS
5.0000 mg | ORAL_TABLET | Freq: Once | ORAL | Status: AC
  Administered 2018-01-11: 5 mg via ORAL
  Filled 2018-01-11: qty 1

## 2018-01-11 MED ORDER — FAMOTIDINE 20 MG PO TABS: 20.0000 mg | ORAL_TABLET | Freq: Two times a day (BID) | ORAL | 5 refills | Status: DC

## 2018-01-11 NOTE — ED Notes (Signed)
Malinda MD at bedside.

## 2018-01-11 NOTE — Telephone Encounter (Signed)
Sent in Rx for bid dosing of pepcid

## 2018-01-11 NOTE — Telephone Encounter (Signed)
Please advise 

## 2018-01-11 NOTE — Discharge Instructions (Addendum)
Please keep your appointment later today with Dr. Rockey Situ.  Recheck to the appointment was canceled if you could call and get it rescheduled for today that would be great please let him know about your hypertensive episode today.  I spoke with Dr. Fletcher Anon.  He wants to start on amlodipine 5 mg once a day for the blood pressure.

## 2018-01-11 NOTE — Telephone Encounter (Signed)
Copied from [place] [id number]. Topic: General - Other >> Jan 11, 2018  1:41 PM [place], [place] wrote:    Pt req 90 day supply   Pt said she was told by the doctor if she need to increase the below med that she could. Pt said she has been taking the below med 2 times a day and now need a new RX  famotidine (PEPCID) 20 MG tablet   Pharmacy   [facility] [address]  [place]

## 2018-01-11 NOTE — ED Triage Notes (Signed)
Pt presents with elevated bp today; states she felt a bit lightheaded, nauseous. Has hx of hypertension. States she knows she had episode of a-fib because her hr increased to 93 from normal in 50's. Pt alert & oriented with nad noted.

## 2018-01-11 NOTE — ED Triage Notes (Signed)
First Nurse Note:  C/O elevated BP this morning. Also c/o feeling slightly dizzy.  AAOx3.  Skin warm and dry. MAE equally and strong.  Gait steady. Posture upright and relaxed.

## 2018-01-11 NOTE — ED Provider Notes (Signed)
[facility] Emergency Department Provider Note   ____________________________________________   First MD Initiated Contact with Patient 01/11/18 1101     (approximate)  I have reviewed the triage vital signs and the nursing notes.   HISTORY  Chief Complaint Hypertension   HPI Andrea [name] is a 77 y.o. female reports she felt a little bit of night lightheadedness nausea and mild amount of back pain.  This is since resolved and she now has just a very mild headache.  She took her metoprolol today along with her Lasix and all the rest of her medications.  When I take her blood pressure in the emergency room is 408 systolic.  Past Medical History:  Diagnosis Date  . AAA (abdominal aortic aneurysm) ([place]) 01/2009   AAA (2.8 x 3.0)  moderate RAS (left); 2.7 x 2.7 cm (07/11/05)  . Acute bronchitis 03/20/2013; 2017  . Angina   . Anosmia   . Asthma   . Baker's cyst of knee 01/30/2014  . Basal cell carcinoma    "back and left arm"  . Bradycardia    Metoprolol stopped 08/2011  . CAD (coronary artery disease)    s/CABG (reports IMA and 2 SVGs) back in 1999  Myoview normal 3/10; s/p PCI with DES to PL branch of distal RCA 09/2011; PCI +DES to SVG-RCA, PCI + DES to mid LCx 12/2015  . Cerebrovascular disease 01/2009   carotid u/s  R 0-39%   L 60-79%  . Chronic thoracic back pain   . COPD (chronic obstructive pulmonary disease) ([place])   . Depression   . Dizziness   . Dysrhythmia    hx of sinus brady  . GERD (gastroesophageal reflux disease)   . Heart murmur   . Herniated lumbar disc without myelopathy   . History of blood transfusion 1999   "when I had the bypass; had a PE"  . Hyperglycemia 10/23/2015  . Hyperlipidemia   . Hypertension   . Hypothyroidism 09/30/2016  . Medicare annual wellness visit, subsequent 07/31/2013  . NSTEMI (non-ST elevated myocardial infarction) ([place]) 11/12   Cath showed atretic IMA graft to the LAD, SVG to PD was patent but the continuation  of this graft to the PL branch was occluded; there were L to R collaterals; Mid LAD had a 60 to 70% stenosis. She has been treated medically.  Neg Myoview 05/2011  . Osteoarthritis of back   . Osteoporosis   . Pneumonia "several times"  . Pulmonary embolism ([place]) 1999   "after my bypass"  . PVD (peripheral vascular disease) ([place])   . Shortness of breath   . Squamous carcinoma    "nose"  . Thyroid disease    Hypothyroid  . Unstable angina ([place]) 09/25/2015    Patient Active Problem List   Diagnosis Date Noted  . Cervical cancer screening 12/09/2017  . Annual physical exam 12/09/2017  . Hypothyroidism 09/30/2016  . Presbycusis of both ears 08/05/2016  . 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 Cramps, extremity 07/03/2016  . 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 Stable angina ([place]) 06/17/2016  . Hx of CABG 12/27/2015  . Hyperglycemia 10/23/2015  . Unstable angina ([place]) 09/25/2015  . Upper airway cough syndrome 03/23/2015  . Hypokalemia 07/24/2014  . Cystitis 04/11/2014  . Baker's cyst of knee 01/30/2014  . Thoracic back pain 01/24/2014  . Breast cancer screening 07/31/2013  . Shortness of breath 05/09/2013  . Anosmia   . Fatigue  09/17/2011  . CAD (coronary artery disease) 06/06/2011  . NSTEMI (non-ST elevated myocardial infarction) ([place]) 04/22/2011  . Excessive somnolence disorder 12/03/2010  . BACK PAIN, LUMBAR 07/16/2010  . SINUSITIS, CHRONIC 05/07/2010  . SKIN CANCER, HX OF 04/03/2009  . Coronary artery disease involving coronary bypass graft of native heart with angina pectoris ([place]) 01/24/2009  . Carotid artery stenosis 01/24/2009  . Atherosclerosis of renal artery ([place]) 01/24/2009  . 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  . AORTIC ANEUR [place] WITHOUT MENTION RUPTURE 01/24/2009  . POST-OP CARDIAC COMPLICATION 81/82/9937  . Hyperlipidemia 12/22/2006  . DEPRESSION 12/22/2006  . HTN (hypertension) 12/22/2006  .  PERIPHERAL VASCULAR DISEASE 12/22/2006  . COPD GOLD II 12/22/2006  . GERD 12/22/2006  . Osteoporosis 12/22/2006    Past Surgical History:  Procedure Laterality Date  . ABDOMINAL AORTIC ANEURYSM REPAIR     pt denies this hx on 01/02/2016  . BASAL CELL CARCINOMA EXCISION    . CARDIAC CATHETERIZATION N/A 01/02/2016   Procedure: Left Heart Cath and Coronary Angiography;  Surgeon: Wellington Hampshire, MD;  Location: [place] CV LAB;  Service: Cardiovascular;  Laterality: N/A;  . CARDIAC CATHETERIZATION  1996; 1999  . CARDIAC CATHETERIZATION N/A 06/25/2016   Procedure: Left Heart Cath and Cors/Grafts Angiography;  Surgeon: Wellington Hampshire, MD;  Location: [place] CV LAB;  Service: Cardiovascular;  Laterality: N/A;  . CAROTID ENDARTERECTOMY Left 01/02/2011  . CATARACT EXTRACTION W/ INTRAOCULAR LENS  IMPLANT, BILATERAL    . CLOSED REDUCTION NASAL FRACTURE  11/2007  . CORONARY ANGIOPLASTY WITH STENT PLACEMENT  10/10/2011   drug eluting  to rc & saphenous  . CORONARY ARTERY BYPASS GRAFT  1999   "CABG X3"  . DILATION AND CURETTAGE OF UTERUS    . FEMORAL ARTERY STENT Bilateral   . FRACTURE SURGERY    . LEFT HEART CATHETERIZATION WITH CORONARY/GRAFT ANGIOGRAM N/A 04/22/2011   Procedure: LEFT HEART CATHETERIZATION WITH Beatrix Fetters;  Surgeon: Jolaine Artist, MD;  Location: [facility] CATH LAB;  Service: Cardiovascular;  Laterality: N/A;  . LEFT HEART CATHETERIZATION WITH CORONARY/GRAFT ANGIOGRAM N/A 10/10/2011   Procedure: LEFT HEART CATHETERIZATION WITH Beatrix Fetters;  Surgeon: Peter M Martinique, MD;  Location: [facility] CATH LAB;  Service: Cardiovascular;  Laterality: N/A;  . NASAL SINUS SURGERY     twice  . SQUAMOUS CELL CARCINOMA EXCISION    . TUBAL LIGATION      Prior to Admission medications   Medication Sig Start Date End Date Taking? Authorizing Provider  acetaminophen (TYLENOL) 650 MG CR tablet Take 1,300 mg by mouth 2 (two) times daily.    [provider]  albuterol  (PROVENTIL HFA;VENTOLIN HFA) 108 (90 Base) MCG/ACT inhaler Inhale 2 puffs into the lungs every 6 (six) hours as needed for wheezing. 04/09/16   Wilhelmina Mcardle, MD  budesonide-formoterol (SYMBICORT) 160-4.5 MCG/ACT inhaler INHALE 2 PUFFS INTO THE LUNGS 2 (TWO) TIMES DAILY. 10/21/17   Wilhelmina Mcardle, MD  Calcium Carbonate-Vitamin D (CALCIUM + D PO) Take 1 tablet by mouth 2 (two) times daily.    [provider]  clopidogrel (PLAVIX) 75 MG tablet TAKE 1 TABLET BY MOUTH EVERY DAY 11/23/17   Minna Merritts, MD  famotidine (PEPCID) 20 MG tablet Take 1 tablet (20 mg) by mouth twice daily    [provider]  fluticasone (FLONASE) 50 MCG/ACT nasal spray Place 2 sprays into both nostrils daily. For congestion 08/08/16   Mosie Lukes, MD  furosemide (LASIX) 20 MG tablet Take 1 tablet (  20 mg) by mouth once a day on Mondays, Wednesdays, & Fridays    [provider]  HYDROcodone-acetaminophen (NORCO) 10-325 MG tablet Take 1 tablet by mouth every 6 (six) hours as needed. Back pain 09/30/16   Mosie Lukes, MD  isosorbide mononitrate (IMDUR) 60 MG 24 hr tablet Take 1 tablet (60 mg total) by mouth 2 (two) times daily. 06/17/16   Minna Merritts, MD  Krill Oil 1000 MG CAPS Take 1,000 mg by mouth daily.    [provider]  levothyroxine (SYNTHROID, LEVOTHROID) 25 MCG tablet TAKE 1 TABLET (25 MCG TOTAL) BY MOUTH DAILY BEFORE BREAKFAST. 08/25/17   Mosie Lukes, MD  metoprolol tartrate (LOPRESSOR) 25 MG tablet Take 1/2 tablet (12.5 mg) by mouth twice daily- hold if HR < 65 bpm    [provider]  nitroGLYCERIN (NITROSTAT) 0.4 MG SL tablet Place 1 tablet (0.4 mg total) under the tongue every 5 (five) minutes as needed for chest pain. 02/10/17   Minna Merritts, MD  Polyethyl Glycol-Propyl Glycol (SYSTANE OP) Place 1 drop into both eyes 3 (three) times daily as needed (dry eyes).     [provider]  potassium chloride (K-DUR) 10 MEQ tablet Take 1 tablet (10 mEq  total) by mouth daily as needed. Patient taking differently: Take 10-20 mEq by mouth daily as needed (for cramping/lab results.).  07/24/14   Minna Merritts, MD  PROAIR HFA 108 [phone] Base) MCG/ACT inhaler NHALE 2 PUFFS INTO THE LUNGS EVERY 6 (SIX) HOURS AS NEEDED FOR WHEEZING. 12/07/17   Wilhelmina Mcardle, MD  rosuvastatin (CRESTOR) 40 MG tablet TAKE 1 TABLET (40 MG TOTAL) BY MOUTH DAILY. 08/25/17   Minna Merritts, MD  sertraline (ZOLOFT) 50 MG tablet TAKE 1 TABLET BY MOUTH EVERY DAY 06/22/17   Mosie Lukes, MD  Spacer/Aero-Holding Josiah Lobo DEVI 1 spacer to use prn with [facility] device as directed 02/08/15   Mosie Lukes, MD  tiotropium (SPIRIVA HANDIHALER) 18 MCG inhalation capsule INHALE 1 CAPSULE VIA HANDIHALER ONCE DAILY AT THE SAME TIME EVERY DAY 11/11/17   Wilhelmina Mcardle, MD  Vitamin D, Ergocalciferol, (DRISDOL) 50000 units CAPS capsule TAKE 1 CAPSULE (50,000 UNITS TOTAL) BY MOUTH EVERY 7 (SEVEN) DAYS. 11/12/17   Mosie Lukes, MD    Allergies Codeine; Erythromycin; Meperidine hcl; Shellfish allergy; Ciprofloxacin; Penicillins; and Tape  Family History  Problem Relation Age of Onset  . Heart attack Mother 98  . Diabetes Mother   . Colon cancer Father 75  . Lung cancer Father        smoked  . Heart disease Father        angina  . Hypertension Father   . Cancer Father        colon, lung  . Lung cancer Sister        smoked  . Cancer Sister   . Hypothyroidism Sister   . Hypertension Brother   . Heart attack Brother   . Heart disease Brother        stent  . Heart attack Brother        CABG  . Heart disease Brother        CABG with 1 bypass  . Aneurysm Brother        brain  . Alcohol abuse Brother   . Hyperlipidemia Daughter   . Diabetes Maternal Grandmother   . Stroke Maternal Grandmother   . Cirrhosis Maternal Grandfather   . Stroke Paternal Grandmother   .  Obesity Daughter   . Obesity Son     Social History Social History   Tobacco Use  . Smoking status: Former  Smoker    Packs/day: 0.50    Years: 40.00    Pack years: 20.00    Types: Cigarettes    Last attempt to quit: 01/19/1993    Years since quitting: 24.9  . Smokeless tobacco: Never Used  Substance Use Topics  . Alcohol use: Yes    Comment: 01/02/2016 "might drink a glass of wine socially q 3-4 months"  . Drug use: No    Review of Systems Currently Constitutional: No fever/chills Eyes: No visual changes. ENT: No sore throat. Cardiovascular: Denies chest pain. Respiratory: Denies shortness of breath. Gastrointestinal: No abdominal pain.  No nausea, no vomiting.  No diarrhea.  No constipation. Genitourinary: Negative for dysuria. Musculoskeletal: Negative for back pain at present. Skin: Negative for rash. Neurological: Very mild headache, focal weakness  ____________________________________________   PHYSICAL EXAM:  VITAL SIGNS: ED Triage Vitals  Enc Vitals Group     BP 01/11/18 1018 (!) 188/86     Pulse Rate 01/11/18 1018 (!) 53     Resp 01/11/18 1018 18     Temp 01/11/18 1018 98 F (36.7 C)     Temp Source 01/11/18 1018 Oral     SpO2 01/11/18 1018 98 %     Weight 01/11/18 1019 140 lb (63.5 kg)     Height 01/11/18 1019 5\' 2"  (1.575 m)     Head Circumference --      Peak Flow --      Pain Score 01/11/18 1018 4     Pain Loc --      Pain Edu? --      Excl. in [place]? --     Constitutional: Alert and oriented. Well appearing and in no acute distress. Eyes: Conjunctivae are normal. PER Head: Atraumatic. Nose: No congestion/rhinnorhea. Mouth/Throat: Mucous membranes are moist.  Oropharynx non-erythematous. Neck: No stridor.  Cardiovascular: Normal rate, regular rhythm. Grossly normal heart sounds.  Good peripheral circulation. Respiratory: Normal respiratory effort.  No retractions. Lungs CTAB. Gastrointestinal: Soft and nontender. No distention. No abdominal bruits. No CVA tenderness. Musculoskeletal: No lower extremity tenderness nor edema.   Neurologic:  Normal speech  and language. No gross focal neurologic deficits are appreciated.  Skin:  Skin is warm, dry and intact. No rash noted.   ____________________________________________   LABS (all labs ordered are listed, but only abnormal results are displayed)  Labs Reviewed  BASIC METABOLIC PANEL - Abnormal; Notable for the following components:      Result Value   Glucose, Bld 104 (*)    GFR calc non Af Amer 59 (*)    All other components within normal limits  CBC - Abnormal; Notable for the following components:   RDW 16.0 (*)    All other components within normal limits  TROPONIN I   ____________________________________________  EKG  EKG read interpreted by me shows sinus bradycardia rate 57 normal axis flipped T's inferiorly and laterally which is similar to one from September 2018 ____________________________________________  [place]  ED MD interpretation: Chest x-ray read by radiology reviewed by me shows no acute disease  Official radiology report(s): Dg Chest 2 View  Result Date: 01/11/2018 CLINICAL DATA:  Hypertension EXAM: CHEST - 2 VIEW COMPARISON:  03/10/16 FINDINGS: Cardiac shadow is stable. Postsurgical changes are again seen and stable. The lungs are well aerated bilaterally. No focal infiltrate or sizable effusion is seen. No bony  abnormality is noted. IMPRESSION: No active cardiopulmonary disease. Electronically Signed   By: Inez Catalina M.D.   On: 01/11/2018 10:48    ____________________________________________   PROCEDURES  Procedure(s) performed:   Procedures  Critical Care perfo  ____________________________________________   INITIAL IMPRESSION / ASSESSMENT AND PLAN / ED COURSE  Resting in the ER patient's blood pressure goes down to 164/91.  Patient has no further complaints.         ____________________________________________   FINAL CLINICAL IMPRESSION(S) / ED DIAGNOSES  Final diagnoses:  Hypertension, unspecified type     ED  Discharge Orders    None       Note:  This document was prepared using Dragon voice recognition software and may include unintentional dictation errors.    Nena Polio, MD 01/11/18 1209

## 2018-01-12 ENCOUNTER — Other Ambulatory Visit: Payer: Self-pay | Admitting: Family Medicine

## 2018-01-12 NOTE — Telephone Encounter (Signed)
Received refill request for sertraline (ZOLOFT) 50 mg tablet.  Last office visit: 12/01/17 Last refill: 06/22/17  Refill sent to pt's pharmacy.

## 2018-01-12 NOTE — Addendum Note (Signed)
Addended by: Wynonia Musty A on: 01/12/2018 10:29 AM   Modules accepted: Orders

## 2018-01-13 ENCOUNTER — Ambulatory Visit: Payer: PPO | Admitting: Pulmonary Disease

## 2018-01-13 ENCOUNTER — Encounter: Payer: Self-pay | Admitting: Pulmonary Disease

## 2018-01-13 VITALS — BP 136/86 | HR 44 | Ht 62.0 in | Wt 146.0 lb

## 2018-01-13 DIAGNOSIS — J449 Chronic obstructive pulmonary disease, unspecified: Secondary | ICD-10-CM

## 2018-01-13 DIAGNOSIS — I272 Pulmonary hypertension, unspecified: Secondary | ICD-10-CM

## 2018-01-13 MED ORDER — UMECLIDINIUM-VILANTEROL 62.5-25 MCG/INH IN AEPB: 1.0000 | INHALATION_SPRAY | Freq: Every day | RESPIRATORY_TRACT | 5 refills | Status: DC

## 2018-01-13 MED ORDER — FAMOTIDINE 20 MG PO TABS: 20.0000 mg | ORAL_TABLET | Freq: Two times a day (BID) | ORAL | 1 refills | Status: DC

## 2018-01-13 NOTE — Telephone Encounter (Addendum)
Pt was requesting 90 day supply. Spoke with pharmacist, Truman Hayward and he will increase quantity to #[phone]. He states insurance will not cover Rx until 01/15/18 even with direction change. He will check with pt to see if she has enough to last until then and if not, he will try to get an override with the insurance. Pt has been notified that Rx has been sent to the pharmacy.

## 2018-01-13 NOTE — Progress Notes (Signed)
PROBLEMS: Former smoker (1/2 PPD x40 years, quit 1994)  COPD with significant asthmatic component. FEV1 67% pred 2014. PFTs 07/25/15: Mild-mod obstruction. FEV1 1.22 > 1.31 liters (65 > 69% pred)  DATA: 0/26/11 CT chest: Moderate changes of emphysema predominantly affecting both upper lobes 02/26/15 echocardiogram: LVEF 60-65%.  Grade 1 diastolic dysfunction.  Mild-moderate MR.  LA is mildly dilated.  PA pressures estimated within normal range. 07/25/15 PFTs: FVC: 2.20 > 2.32 L (87 > 92 %pred), FEV1: 1.22 > 1.31 L (65 > 69 %pred), FEV1/FVC: 57%, TLC: 3.81 L (80 %pred), DLCO 79% %pred.  Gas dilution technique probably underestimating TLC 08/11/16 echocardiogram: LVEF 60-65%.  Grade 1 diastolic dysfunction.  Mild-moderate MR.  LA mildly dilated.  RV systolic pressure estimate 57 mmHg.  RA and RV both mildly dilated. 0/18/19 echocardiogram: LVEF 55-60%.  Mild-moderate MR.  LA mildly dilated.  RV systolic pressure estimate 49 mmHg.  Right atrium moderately-severely dilated.    INTERVAL: Last seen 07/17/17.  No major pulmonary events in the interim  SUBJ: This is a routine scheduled reevaluation.  However, since last visit, she has undergone an echocardiogram revealing pulmonary hypertension.  Overall, her respiratory status is unchanged.  She continues to have mild exertional dyspnea.  She denies CP, fever, purulent sputum, hemoptysis, LE edema and calf tenderness.  She remains on Symbicort and Spiriva inhalers.  She is unable to discern definitively whether either or both of these inhalers are benefiting her.  She would like to try to simplify her COPD regimen if possible.   With regard to the finding of pulmonary hypertension on echocardiogram, this has apparently been present since at least March 2018.  The estimated value does not appear to be any worse but the right atrium appears to be more dilated which is worrisome.  Her primary care provider raises the concern for possible sleep apnea.   Patient does report that she snores and sometimes that her snoring awakens her.  Her sleep is not restful and she naps frequently during the day.  She did undergo a sleep study in [place] in 2012 which did not reveal obstructive sleep apnea.  She does have a history of long-standing hypertension.  OBJ: Vitals:   01/13/18 0851 01/13/18 0857  BP:  136/86  Pulse:  (!) 44  SpO2:  93%  Weight: 146 lb (66.2 kg)   Height: 5\' 2"  (1.575 m)   RA  Gen: NAD HEENT: NCAT, sclera white Neck: No JVD Lungs: breath sounds full, no wheezes or other adventitious sounds Cardiovascular: RRR, no murmurs Abdomen: Soft, nontender, normal BS Ext: without clubbing, cyanosis, edema Neuro: grossly intact Skin: Limited exam, no lesions noted    DATA: BMP Latest Ref Rng & Units 01/11/2018 12/01/2017 09/30/2016  Glucose 70 - 99 mg/dL 104(H) 89 91  BUN 8 - 23 mg/dL 14 13 15   Creatinine 0.44 - 1.00 mg/dL 0.91 0.87 0.94  BUN/Creat Ratio 12 - 28 - - -  Sodium 135 - 145 mmol/L 142 143 142  Potassium 3.5 - 5.1 mmol/L 4.3 4.3 3.5  Chloride 98 - 111 mmol/L 103 103 104  CO2 22 - 32 mmol/L 31 33(H) 34(H)  Calcium 8.9 - 10.3 mg/dL 9.3 9.8 9.7    CBC Latest Ref Rng & Units 01/11/2018 12/01/2017 09/30/2016  WBC 3.6 - 11.0 K/uL 6.4 6.5 6.3  Hemoglobin 12.0 - 16.0 g/dL 14.7 14.5 14.0  Hematocrit 35.0 - 47.0 % 43.3 42.7 42.9  Platelets 150 - 440 K/uL 160 160.0 181.0  CXR 08/19: No acute findings   IMPRESSION:   ICD-10-CM   1. COPD, moderate  J44.9   2. Pulmonary hypertension  I27.20 Pulse oximetry, overnight    COPD is moderate based on most recent PFTs.  There is little day-to-day variation in her symptoms.  There does not appear to be significant progression over time.  The pulmonary hypertension is not new.  By echocardiogram, it appears to be moderate in severity.  It is most likely due to COPD.  She has previously been evaluated for sleep apnea with a normal sleep study.  However, it is possible that she has  significant nocturnal desaturations which, if significant enough, might be contributing to pulmonary hypertension.   PLAN: Discontinue Symbicort and Spiriva inhalers Anoro inhaler prescribed today.  1 inhalation daily. Overnight oximetry ordered.  Depending on the results of that test, will consider initiating nocturnal oxygen therapy.  We will contact her with results.  [place] up in 3 months or sooner as needed.  Merton Border, MD PCCM service Mobile [phone] Pager [phone] 01/13/2018 3:28 PM

## 2018-01-13 NOTE — Patient Instructions (Addendum)
Trial of Anoro inhaler.  She is to be used in place of Symbicort and Spiriva inhalers.  We have provided samples.  1 inhalation daily.  I have placed a prescription for this change.  If you experience any problems such as worsening of shortness of breath or other breathing difficulties, contact our office for further instructions.  Overnight oximetry ordered.  Depending on results of that test, I might prescribe oxygen therapy to be worn with sleep.  Follow-up in 3 months or sooner as needed

## 2018-01-13 NOTE — Addendum Note (Signed)
Addended by: Kelle Darting A on: 01/13/2018 04:25 PM   Modules accepted: Orders

## 2018-01-14 NOTE — Telephone Encounter (Signed)
Patient has not returned our calls to schedule. Records will be in "records reviewed"

## 2018-01-18 ENCOUNTER — Encounter: Payer: Self-pay | Admitting: Physician Assistant

## 2018-01-18 ENCOUNTER — Ambulatory Visit (INDEPENDENT_AMBULATORY_CARE_PROVIDER_SITE_OTHER): Payer: PPO | Admitting: Physician Assistant

## 2018-01-18 VITALS — BP 132/86 | HR 62 | Ht 62.0 in | Wt 144.8 lb

## 2018-01-18 DIAGNOSIS — I471 Supraventricular tachycardia: Secondary | ICD-10-CM

## 2018-01-18 DIAGNOSIS — I10 Essential (primary) hypertension: Secondary | ICD-10-CM

## 2018-01-18 DIAGNOSIS — I272 Pulmonary hypertension, unspecified: Secondary | ICD-10-CM | POA: Diagnosis not present

## 2018-01-18 DIAGNOSIS — R002 Palpitations: Secondary | ICD-10-CM

## 2018-01-18 DIAGNOSIS — Z951 Presence of aortocoronary bypass graft: Secondary | ICD-10-CM | POA: Diagnosis not present

## 2018-01-18 DIAGNOSIS — I517 Cardiomegaly: Secondary | ICD-10-CM | POA: Diagnosis not present

## 2018-01-18 DIAGNOSIS — I25118 Atherosclerotic heart disease of native coronary artery with other forms of angina pectoris: Secondary | ICD-10-CM | POA: Diagnosis not present

## 2018-01-18 DIAGNOSIS — I2510 Atherosclerotic heart disease of native coronary artery without angina pectoris: Secondary | ICD-10-CM | POA: Diagnosis not present

## 2018-01-18 MED ORDER — AMLODIPINE BESYLATE 10 MG PO TABS: 10.0000 mg | ORAL_TABLET | Freq: Every day | ORAL | 3 refills | Status: DC

## 2018-01-18 NOTE — Progress Notes (Signed)
Cardiology Office Note Date:  01/18/2018  Patient ID:  Andrea [name], Andrea [name] 04/29/1941, MRN [id number] PCP:  Andrea Lukes, MD  Cardiologist:  Dr. Rockey Situ, MD    Chief Complaint: ED follow up for HTN  History of Present Illness: Andrea [name] is a 77 y.o. 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  Most recent LHC from 05/2016 showed significant underlying 3-vessel CAD with a patent LIMA-LAD, occluded SVG-RCA proximally with left-to-right collaterals, patent LCx stent without significant stenosis, mildly reduced LVSF with an EF of 50-55% with inferior wall HK, mildly elevated LVEDP. An aortic arch angiogram was done to evaluate the stenosis in the left subclavian artery which showed 60% heavily calcified stenosis in the ostium of the left subclavian artery with about 78-29 mm systolic gradient across the stenosis. It was mentioned she may require a left subclavian artery stent to protect the LIMA flow to the LAD. Echo from 07/2016 showed an EF of 60-65%, basal septal wall HK, Gr1DD, mild to moderate MR, mild biatrial enlargement, mildly dilated RV with normal RVSF, moderate to severe TR, PASP 57 mmHg. She was most recently seen in our office in 01/2017 and was doing well. She was seen by her PCP on 12/01/2017 and noted exertional dyspnea. Echo performed on 12/10/2017 showed a stable EF of 55-60%, no RWMA, mild LVH, mild to moderate MR, mildly dilated LA, moderately to  severe dilated RA, moderate TR, PASP 49 mmHg. Patient called our office on 12/25/2017 noting variations in her heart rate from the 40s to 90s bpm. Holter monitor showed an overall rhythm of sinus with some episodes of SVT/atrial tachycardia with the longest run lasting 14 beats, frequent PACs and rare PVC/bigeminy. She cancelled her appointment with Dr. Rockey [name] on 01/11/18 as she was in the ED that day with lightheadedness, nausea, and back pain. Her BP was noted to be 562 mmHg systolic in the ED. EKG showed sinus bradycardia, 57 bpm, possible prior anterior infarct, inferolateral TWI (unchanged from prior studies). CXR was not acute. Troponin negative x 1, WBC 6.4, HGB 14.7, PLT 160, Na 142, K+ 4.3, glucose 104, BUN/SCr 14/0.91. She was discharged to outpatient follow up. She was seen by pulmonology on 01/13/2018 for follow up of her COPD and pulmonary hypertension. She has previously undergone a sleep study in 2012 that was normal. She is being evaluated for possible nocturnal hypoxia.   She comes in today accompanied by her husband. She is doing reasonably well from a cardiac perspective. Her main concern today is her elevated BP with readings ranging into the 130Q systolic prior to going to the ED as above. This has been in the setting of low back pain and tachy-palpitations. She is currently taking amlodipine (recently added), Lopressor 25 mg once daily, Imdur 60 mg bid, and Lasix 20 mg on MWF. Lately, her BP readings have ranged from the 657Q to 469G systolic. She denies any chest pain or or dyspnea. She notes  intermittent tachy-palpitations that are described as a "heart pounding" that sometimes lasts for a few minutes and spontaneously resolve. She did not have any episodes this long while wearing the Holter monitor. No dizziness, presyncope, or syncope. No lower extremity swelling.   Past Medical History:  Diagnosis Date  . AAA (abdominal aortic aneurysm) ([place]) 01/2009   AAA (2.8 x 3.0)  moderate RAS  (left); 2.7 x 2.7 cm (07/11/05)  . Acute bronchitis 03/20/2013; 2017  . Angina   . Anosmia   . Asthma   . Baker's cyst of knee 01/30/2014  . Basal cell carcinoma    "back and left arm"  . Bradycardia    Metoprolol stopped 08/2011  . CAD (coronary artery disease)    s/CABG (reports IMA and 2 SVGs) back in 1999  Myoview normal 3/10; s/p PCI with DES to PL branch of distal RCA 09/2011; PCI +DES to SVG-RCA, PCI + DES to mid LCx 12/2015  . Cerebrovascular disease 01/2009   carotid u/s  R 0-39%   L 60-79%  . Chronic thoracic back pain   . COPD (chronic obstructive pulmonary disease) ([place])   . Depression   . Dizziness   . Dysrhythmia    hx of sinus brady  . GERD (gastroesophageal reflux disease)   . Heart murmur   . Herniated lumbar disc without myelopathy   . History of blood transfusion 1999   "when I had the bypass; had a PE"  . Hyperglycemia 10/23/2015  . Hyperlipidemia   . Hypertension   . Hypothyroidism 09/30/2016  . Medicare annual wellness visit, subsequent 07/31/2013  . NSTEMI (non-ST elevated myocardial infarction) ([place]) 11/12   Cath showed atretic IMA graft to the LAD, SVG to PD was patent but the continuation of this graft to the PL branch was occluded; there were L to R collaterals; Mid LAD had a 60 to 70% stenosis. She has been treated medically.  Neg Myoview 05/2011  . Osteoarthritis of back   . Osteoporosis   . Pneumonia "several times"  . Pulmonary embolism ([place]) 1999   "after my bypass"  . PVD (peripheral vascular disease) ([place])   . Shortness of breath   . Squamous carcinoma    "nose"  . Thyroid disease    Hypothyroid  . Unstable angina ([place]) 09/25/2015    Past Surgical History:  Procedure Laterality Date  . ABDOMINAL AORTIC ANEURYSM REPAIR     pt denies this hx on 01/02/2016  . BASAL CELL CARCINOMA EXCISION    . CARDIAC CATHETERIZATION N/A 01/02/2016   Procedure: Left Heart Cath and Coronary Angiography;  Surgeon: Andrea Hampshire, MD;  Location: [facility];   Service: Cardiovascular;  Laterality: N/A;  . CARDIAC CATHETERIZATION  1996; 1999  . CARDIAC CATHETERIZATION N/A 06/25/2016   Procedure: Left Heart Cath and Cors/Grafts Angiography;  Surgeon: Andrea Hampshire, MD;  Location: [facility];  Service: Cardiovascular;  Laterality: N/A;  . CAROTID ENDARTERECTOMY Left 01/02/2011  . CATARACT EXTRACTION W/ INTRAOCULAR LENS  IMPLANT, BILATERAL    . CLOSED REDUCTION NASAL FRACTURE  11/2007  . CORONARY ANGIOPLASTY WITH STENT PLACEMENT  10/10/2011   drug eluting  to rc & saphenous  . CORONARY ARTERY BYPASS GRAFT  1999   "CABG X3"  . DILATION AND CURETTAGE OF UTERUS    . FEMORAL ARTERY STENT Bilateral   . FRACTURE SURGERY    . LEFT HEART CATHETERIZATION WITH CORONARY/GRAFT ANGIOGRAM N/A 04/22/2011   Procedure: LEFT HEART CATHETERIZATION WITH CORONARY/GRAFT ANGIOGRAM;  Surgeon: Jolaine Artist, MD;  Location: [facility];  Service: Cardiovascular;  Laterality: N/A;  . LEFT HEART CATHETERIZATION WITH CORONARY/GRAFT ANGIOGRAM N/A 10/10/2011   Procedure: LEFT HEART CATHETERIZATION WITH Beatrix Fetters;  Surgeon: Peter M Martinique, MD;  Location: [facility] CATH LAB;  Service: Cardiovascular;  Laterality: N/A;  . NASAL SINUS SURGERY     twice  . SQUAMOUS CELL CARCINOMA EXCISION    . TUBAL LIGATION      Current Meds  Medication Sig  . acetaminophen (TYLENOL) 650 MG CR tablet Take 1,300 mg by mouth 2 (two) times daily.  . Calcium Carbonate-Vitamin D (CALCIUM + D PO) Take 1 tablet by mouth 2 (two) times daily.  . clopidogrel (PLAVIX) 75 MG tablet TAKE 1 TABLET BY MOUTH EVERY DAY  . famotidine (PEPCID) 20 MG tablet Take 1 tablet (20 mg total) by mouth 2 (two) times daily. Take 1 tablet (20 mg) by mouth twice daily  . fluticasone (FLONASE) 50 MCG/ACT nasal spray Place 2 sprays into both nostrils daily. For congestion  . furosemide (LASIX) 20 MG tablet Take 1 tablet (20 mg) by mouth once a day on Mondays, Wednesdays, & Fridays  . HYDROcodone-acetaminophen  (NORCO) 10-325 MG tablet Take 1 tablet by mouth every 6 (six) hours as needed. Back pain  . isosorbide mononitrate (IMDUR) 60 MG 24 hr tablet Take 1 tablet (60 mg total) by mouth 2 (two) times daily.  Javier Docker Oil 1000 MG CAPS Take 1,000 mg by mouth daily.  Marland Kitchen levothyroxine (SYNTHROID, LEVOTHROID) 25 MCG tablet TAKE 1 TABLET (25 MCG TOTAL) BY MOUTH DAILY BEFORE BREAKFAST.  . metoprolol tartrate (LOPRESSOR) 25 MG tablet Take 25 mg by mouth daily.   . nitroGLYCERIN (NITROSTAT) 0.4 MG SL tablet Place 1 tablet (0.4 mg total) under the tongue every 5 (five) minutes as needed for chest pain.  Vladimir Faster Glycol-Propyl Glycol (SYSTANE OP) Place 1 drop into both eyes 3 (three) times daily as needed (dry eyes).   . potassium chloride (K-DUR) 10 MEQ tablet Take 1 tablet (10 mEq total) by mouth daily as needed. (Patient taking differently: Take 10-20 mEq by mouth daily as needed (for cramping/lab results.). )  . PROAIR HFA 108 (90 Base) MCG/ACT inhaler NHALE 2 PUFFS INTO THE LUNGS EVERY 6 (SIX) HOURS AS NEEDED FOR WHEEZING.  . rosuvastatin (CRESTOR) 40 MG tablet TAKE 1 TABLET (40 MG TOTAL) BY MOUTH DAILY.  Marland Kitchen sertraline (ZOLOFT) 50 MG tablet TAKE 1 TABLET BY MOUTH EVERY DAY  . tiotropium (SPIRIVA HANDIHALER) 18 MCG inhalation capsule INHALE 1 CAPSULE VIA HANDIHALER ONCE DAILY AT THE SAME TIME EVERY DAY  . umeclidinium-vilanterol (ANORO ELLIPTA) 62.5-25 MCG/INH AEPB Inhale 1 puff into the lungs daily.  . Vitamin D, Ergocalciferol, (DRISDOL) 50000 units CAPS capsule TAKE 1 CAPSULE (50,000 UNITS TOTAL) BY MOUTH EVERY 7 (SEVEN) DAYS.  . [DISCONTINUED] amLODipine (NORVASC) 5 MG tablet Take 5 mg by mouth daily.    Allergies:   Codeine; Erythromycin; Meperidine hcl; Shellfish allergy; Ciprofloxacin; Penicillins; and Tape   Social History:  The patient  reports that she quit smoking about 25 years ago. Her smoking use included cigarettes. She has a 20.00 pack-year smoking history. She has never used smokeless tobacco.  She reports that she drinks alcohol. She reports that she does not use drugs.   Family History:  The patient's family history includes Alcohol abuse in her brother; Aneurysm in her brother; Cancer in her father and sister; Cirrhosis in her maternal grandfather; Colon cancer (age of onset: 42)  in her father; Diabetes in her maternal grandmother and mother; Heart attack in her brother and brother; Heart attack (age of onset: 22) in her mother; Heart disease in her brother, brother, and father; Hyperlipidemia in her daughter; Hypertension in her brother and father; Hypothyroidism in her sister; Lung cancer in her father and sister; Obesity in her daughter and son; Stroke in her maternal grandmother and paternal grandmother.  ROS:   Review of Systems  Constitutional: Positive for malaise/fatigue. Negative for chills, diaphoresis, fever and weight loss.  HENT: Negative for congestion.   Eyes: Negative for discharge and redness.  Respiratory: Negative for cough, hemoptysis, sputum production, shortness of breath and wheezing.   Cardiovascular: Positive for palpitations. Negative for chest pain, orthopnea, claudication, leg swelling and PND.  Gastrointestinal: Negative for abdominal pain, blood in stool, heartburn, melena, nausea and vomiting.  Genitourinary: Negative for hematuria.  Musculoskeletal: Positive for back pain. Negative for falls and myalgias.  Skin: Negative for rash.  Neurological: Positive for weakness. Negative for dizziness, tingling, tremors, sensory change, speech change, focal weakness and loss of consciousness.  Endo/Heme/Allergies: Does not bruise/bleed easily.  Psychiatric/Behavioral: Negative for substance abuse. The patient is not nervous/anxious.   All other systems reviewed and are negative.    PHYSICAL EXAM:  VS:  BP 132/86 (BP Location: Right Arm, Patient Position: Sitting, Cuff Size: Normal)   Pulse 62   Ht 5\' 2"  (1.575 m)   Wt 144 lb 12 oz (65.7 kg)   BMI 26.48  kg/m  BMI: Body mass index is 26.48 kg/m.  Physical Exam  Constitutional: She is oriented to person, place, and time. She appears well-developed and well-nourished.  HENT:  Head: Normocephalic and atraumatic.  Eyes: Right eye exhibits no discharge. Left eye exhibits no discharge.  Neck: Normal range of motion. No JVD present.  Cardiovascular: Normal rate, regular rhythm, S1 normal and S2 normal. Exam reveals no distant heart sounds, no friction rub, no midsystolic click and no opening snap.  Murmur heard. High-pitched blowing holosystolic murmur is present with a grade of 2/6 at the apex. Pulses:      Carotid pulses are on the right side with bruit.      Posterior tibial pulses are 2+ on the right side, and 2+ on the left side.  Pulmonary/Chest: Effort normal and breath sounds normal. No respiratory distress. She has no decreased breath sounds. She has no wheezes. She has no rales. She exhibits no tenderness.  Abdominal: Soft. She exhibits no distension. There is no tenderness.  Musculoskeletal: She exhibits no edema.  Neurological: She is alert and oriented to person, place, and time.  Skin: Skin is warm and dry. No cyanosis. Nails show no clubbing.  Psychiatric: She has a normal mood and affect. Her speech is normal and behavior is normal. Judgment and thought content normal.     EKG:  Was ordered and interpreted by me today. Shows NSR, 62 bpm, PACs, inferior and anterolateral TWI (unchanged from prior)  Recent Labs: 12/01/2017: ALT 11; TSH 2.77 01/11/2018: BUN 14; Creatinine, Ser 0.91; Hemoglobin 14.7; Platelets 160; Potassium 4.3; Sodium 142  12/01/2017: Cholesterol 148; HDL 36.50; LDL Cholesterol 78; Total CHOL/HDL Ratio 4; Triglycerides 166.0; VLDL 33.2   Estimated Creatinine Clearance: 45.3 mL/min (by C-G formula based on SCr of 0.91 mg/dL).   Wt Readings from Last 3 Encounters:  01/18/18 144 lb 12 oz (65.7 kg)  01/13/18 146 lb (66.2 kg)  01/11/18 140 lb (63.5 kg)     Other  studies reviewed:  Additional studies/records reviewed today include: summarized above  ASSESSMENT AND PLAN:  1. Palpitations/SVT/PACs: We are limited in rate control options given her underlying chronic bradycardia. Therefore, I am unable to titrate her metoprolol or add diltiazem in place of amlodipine. Because of her underlying COPD I do not think amiodarone is a great option. She is not a candidate for Flecainide given her underlying CAD. We have agreed to refer her to EP for consideration of SVT ablation. Continue Lopressor for now. Would consider changing to Coreg (given less chronotropic effect and better BP response). Recent potassium at goal. Recent thyroid function normal.   2. HTN: Increase amlodipine to 10 mg daily. Continue Lopressor 25 mg once daily (limited by her underlying bradycardia). Could consider changing to Coreg as well. Continue Imdur 60 mg bid and Lasix 20 mg MWF.   3. CAD s/p CABG without angina: No symptoms concerning for angina. Continue current mediations. No plans for ischemic evaluation at this time. Aggressive secondary prevention.   4. PAD/carotid artery disease: No symptoms concerning for claudication. Most recent carotid artery ultrasound stable. Continue current medications.   5. Pulmonary hypertension: Likely in the setting of her underlying COPD. Followed by pulmonology. Remains on Lasix 20 mg MWF. Consider referral to the advanced heart failure clinic.   6. Right atrial enlargement: Undergoing nocturnal oximetry. Likely in the setting of her underling pulmonary hypertension. Await pulmonology evaluation.   7. Valvular heart disease: Stable on recent echo. Monitor.   Disposition: F/u with Dr. Rockey [name] after her EP evaluation.   Current medicines are reviewed at length with the patient today.  The patient did not have any concerns regarding medicines.  Signed, Christell Faith, PA-C 01/18/2018 4:38 PM     [address] [place], [place] [address] [phone]

## 2018-01-18 NOTE — Patient Instructions (Signed)
Medication Instructions:  Your physician has recommended you make the following change in your medication:  1. INCREASE Amlodipine to 10 mg once daily   Follow-Up: You have been referred to Dr. Curt Bears for possible SVT ablation   Your physician recommends that you schedule a follow-up appointment: Dr. Rockey Situ after appointment has been done with Dr. Curt Bears.

## 2018-01-21 ENCOUNTER — Telehealth: Payer: Self-pay | Admitting: *Deleted

## 2018-01-21 DIAGNOSIS — J449 Chronic obstructive pulmonary disease, unspecified: Secondary | ICD-10-CM

## 2018-01-21 DIAGNOSIS — I270 Primary pulmonary hypertension: Secondary | ICD-10-CM | POA: Diagnosis not present

## 2018-01-21 NOTE — Telephone Encounter (Signed)
ONO reveal patient need to begin 2 liters 02 with sleep. Her 02 levels dropped significantly with sleep and this may be the reason for pulmonary hypertension.

## 2018-01-26 ENCOUNTER — Ambulatory Visit: Payer: PPO | Admitting: Cardiology

## 2018-01-26 ENCOUNTER — Encounter: Payer: Self-pay | Admitting: Cardiology

## 2018-01-26 VITALS — BP 154/82 | HR 62 | Ht 62.0 in | Wt 145.0 lb

## 2018-01-26 DIAGNOSIS — I25810 Atherosclerosis of coronary artery bypass graft(s) without angina pectoris: Secondary | ICD-10-CM

## 2018-01-26 DIAGNOSIS — Z01812 Encounter for preprocedural laboratory examination: Secondary | ICD-10-CM | POA: Diagnosis not present

## 2018-01-26 DIAGNOSIS — I471 Supraventricular tachycardia: Secondary | ICD-10-CM

## 2018-01-26 DIAGNOSIS — I10 Essential (primary) hypertension: Secondary | ICD-10-CM

## 2018-01-26 NOTE — Progress Notes (Signed)
Electrophysiology Office Note   Date:  01/26/2018   ID:  Andrea [name], DOB 1940/08/03, MRN [id number]  PCP:  Mosie Lukes, MD  Cardiologist:  Rockey Situ Primary Electrophysiologist:  Will Meredith Leeds, MD    No chief complaint on file.    History of Present Illness: Andrea [name] is a 77 y.o. female who is being seen today for the evaluation of SVT at the request of Andrea [name]. Presenting today for electrophysiology evaluation.  She has a history of coronary disease status post CABG in 1999 status post PCI to the distal RCA in 2013, PCI x2 to the SVG to the RCA into the mid circumflex in 2017, PVD status post lower extremity stenting, carotid artery disease status post left-sided CEA, pulmonary hypertension, valvular heart disease, COPD, infrarenal AAA, PE, hypertension, hyperlipidemia, asthma, hypothyroidism.  Her a Holter monitor with episodes of SVT/atrial tachycardia, longest lasting 14 beats with frequent PACs and rare PVCs.  She has palpitations on a daily basis.  They last between 5 and 10 minutes at times.  There are no exacerbating or alleviating factors.   Today, she denies symptoms of palpitations, chest pain, shortness of breath, orthopnea, PND, lower extremity edema, claudication, dizziness, presyncope, syncope, bleeding, or neurologic sequela. The patient is tolerating medications without difficulties.    Past Medical History:  Diagnosis Date  . AAA (abdominal aortic aneurysm) ([place]) 01/2009   AAA (2.8 x 3.0)  moderate RAS (left); 2.7 x 2.7 cm (07/11/05)  . Acute bronchitis 03/20/2013; 2017  . Angina   . Anosmia   . Asthma   . Baker's cyst of knee 01/30/2014  . Basal cell carcinoma    "back and left arm"  . Bradycardia    Metoprolol stopped 08/2011  . CAD (coronary artery disease)    s/CABG (reports IMA and 2 SVGs) back in 1999  Myoview normal 3/10; s/p PCI with DES to PL branch of distal RCA 09/2011; PCI +DES to SVG-RCA, PCI + DES to mid LCx 12/2015  . Cerebrovascular  disease 01/2009   carotid u/s  R 0-39%   L 60-79%  . Chronic thoracic back pain   . COPD (chronic obstructive pulmonary disease) ([place])   . Depression   . Dizziness   . Dysrhythmia    hx of sinus brady  . GERD (gastroesophageal reflux disease)   . Heart murmur   . Herniated lumbar disc without myelopathy   . History of blood transfusion 1999   "when I had the bypass; had a PE"  . Hyperglycemia 10/23/2015  . Hyperlipidemia   . Hypertension   . Hypothyroidism 09/30/2016  . Medicare annual wellness visit, subsequent 07/31/2013  . NSTEMI (non-ST elevated myocardial infarction) ([place]) 11/12   Cath showed atretic IMA graft to the LAD, SVG to PD was patent but the continuation of this graft to the PL branch was occluded; there were L to R collaterals; Mid LAD had a 60 to 70% stenosis. She has been treated medically.  Neg Myoview 05/2011  . Osteoarthritis of back   . Osteoporosis   . Pneumonia "several times"  . Pulmonary embolism ([place]) 1999   "after my bypass"  . PVD (peripheral vascular disease) ([place])   . Shortness of breath   . Squamous carcinoma    "nose"  . Thyroid disease    Hypothyroid  . Unstable angina ([place]) 09/25/2015   Past Surgical History:  Procedure Laterality Date  . ABDOMINAL AORTIC ANEURYSM REPAIR     pt denies this hx on  01/02/2016  . BASAL CELL CARCINOMA EXCISION    . CARDIAC CATHETERIZATION N/A 01/02/2016   Procedure: Left Heart Cath and Coronary Angiography;  Surgeon: Wellington Hampshire, MD;  Location: [place] [facility];  Service: Cardiovascular;  Laterality: N/A;  . CARDIAC CATHETERIZATION  1996; 1999  . CARDIAC CATHETERIZATION N/A 06/25/2016   Procedure: Left Heart Cath and Cors/Grafts Angiography;  Surgeon: Wellington Hampshire, MD;  Location: [place] [facility];  Service: Cardiovascular;  Laterality: N/A;  . CAROTID ENDARTERECTOMY Left 01/02/2011  . CATARACT EXTRACTION W/ INTRAOCULAR LENS  IMPLANT, BILATERAL    . CLOSED REDUCTION NASAL FRACTURE  11/2007  . CORONARY  ANGIOPLASTY WITH STENT PLACEMENT  10/10/2011   drug eluting  to rc & saphenous  . CORONARY ARTERY BYPASS GRAFT  1999   "CABG X3"  . DILATION AND CURETTAGE OF UTERUS    . FEMORAL ARTERY STENT Bilateral   . FRACTURE SURGERY    . LEFT HEART CATHETERIZATION WITH CORONARY/GRAFT ANGIOGRAM N/A 04/22/2011   Procedure: LEFT HEART CATHETERIZATION WITH Beatrix Fetters;  Surgeon: Jolaine Artist, MD;  Location[facility] CATH LAB;  Service: Cardiovascular;  Laterality: N/A;  . LEFT HEART CATHETERIZATION WITH CORONARY/GRAFT ANGIOGRAM N/A 10/10/2011   Procedure: LEFT HEART CATHETERIZATION WITH Beatrix Fetters;  Surgeon: Peter M Martinique, MD;  Location: [facility] CATH LAB;  Service: Cardiovascular;  Laterality: N/A;  . NASAL SINUS SURGERY     twice  . SQUAMOUS CELL CARCINOMA EXCISION    . TUBAL LIGATION       Current Outpatient Medications  Medication Sig Dispense Refill  . acetaminophen (TYLENOL) 650 MG CR tablet Take 1,300 mg by mouth 2 (two) times daily.    Marland Kitchen amLODipine (NORVASC) 10 MG tablet Take 1 tablet (10 mg total) by mouth daily. 180 tablet 3  . Calcium Carbonate-Vitamin D (CALCIUM + D PO) Take 1 tablet by mouth 2 (two) times daily.    . clopidogrel (PLAVIX) 75 MG tablet TAKE 1 TABLET BY MOUTH EVERY DAY 90 tablet 0  . famotidine (PEPCID) 20 MG tablet Take 1 tablet (20 mg total) by mouth 2 (two) times daily. Take 1 tablet (20 mg) by mouth twice daily 180 tablet 1  . fluticasone (FLONASE) 50 MCG/ACT nasal spray Place 2 sprays into both nostrils daily. For congestion 48 g 3  . furosemide (LASIX) 20 MG tablet Take 1 tablet (20 mg) by mouth once a day on Mondays, Wednesdays, & Fridays    . HYDROcodone-acetaminophen (NORCO) 10-325 MG tablet Take 1 tablet by mouth every 6 (six) hours as needed. Back pain 20 tablet 0  . isosorbide mononitrate (IMDUR) 60 MG 24 hr tablet Take 1 tablet (60 mg total) by mouth 2 (two) times daily. 180 tablet 3  . Krill Oil 1000 MG CAPS Take 1,000 mg by mouth daily.      Marland Kitchen levothyroxine (SYNTHROID, LEVOTHROID) 25 MCG tablet TAKE 1 TABLET (25 MCG TOTAL) BY MOUTH DAILY BEFORE BREAKFAST. 90 tablet 3  . metoprolol tartrate (LOPRESSOR) 25 MG tablet Take 25 mg by mouth daily.     . nitroGLYCERIN (NITROSTAT) 0.4 MG SL tablet Place 1 tablet (0.4 mg total) under the tongue every 5 (five) minutes as needed for chest pain. 25 tablet 3  . Polyethyl Glycol-Propyl Glycol (SYSTANE OP) Place 1 drop into both eyes 3 (three) times daily as needed (dry eyes).     . potassium chloride (K-DUR) 10 MEQ tablet Take 1 tablet (10 mEq total) by mouth daily as needed. (Patient taking differently: Take 10-20  mEq by mouth daily as needed (for cramping/lab results.). ) 90 tablet 3  . PROAIR HFA 108 (90 Base) MCG/ACT inhaler NHALE 2 PUFFS INTO THE LUNGS EVERY 6 (SIX) HOURS AS NEEDED FOR WHEEZING. 8.5 Inhaler 1  . rosuvastatin (CRESTOR) 40 MG tablet TAKE 1 TABLET (40 MG TOTAL) BY MOUTH DAILY. 90 tablet 3  . sertraline (ZOLOFT) 50 MG tablet TAKE 1 TABLET BY MOUTH EVERY DAY 90 tablet 1  . umeclidinium-vilanterol (ANORO ELLIPTA) 62.5-25 MCG/INH AEPB Inhale 1 puff into the lungs daily. 60 each 5  . Vitamin D, Ergocalciferol, (DRISDOL) 50000 units CAPS capsule TAKE 1 CAPSULE (50,000 UNITS TOTAL) BY MOUTH EVERY 7 (SEVEN) DAYS. 4 capsule 4   No current facility-administered medications for this visit.     Allergies:   Codeine; Erythromycin; Meperidine hcl; Shellfish allergy; Ciprofloxacin; Penicillins; and Tape   Social History:  The patient  reports that she quit smoking about 25 years ago. Her smoking use included cigarettes. She has a 20.00 pack-year smoking history. She has never used smokeless tobacco. She reports that she drinks alcohol. She reports that she does not use drugs.   Family History:  The patient's family history includes Alcohol abuse in her brother; Aneurysm in her brother; Cancer in her father and sister; Cirrhosis in her maternal grandfather; Colon cancer (age of onset: 21) in her  father; Diabetes in her maternal grandmother and mother; Heart attack in her brother and brother; Heart attack (age of onset: 14) in her mother; Heart disease in her brother, brother, and father; Hyperlipidemia in her daughter; Hypertension in her brother and father; Hypothyroidism in her sister; Lung cancer in her father and sister; Obesity in her daughter and son; Stroke in her maternal grandmother and paternal grandmother.    ROS:  Please see the history of present illness.   Otherwise, review of systems is positive for palpitations, shortness of breath, snoring, wheezing, headaches.   All other systems are reviewed and negative.    PHYSICAL EXAM: VS:  BP (!) 154/82   Pulse 62   Ht 5\' 2"  (1.575 m)   Wt 145 lb (65.8 kg)   SpO2 90%   BMI 26.52 kg/m  , BMI Body mass index is 26.52 kg/m. GEN: Well nourished, well developed, in no acute distress  HEENT: normal  Neck: no JVD, carotid bruits, or masses Cardiac: RRR; no murmurs, rubs, or gallops,no edema  Respiratory:  clear to auscultation bilaterally, normal work of breathing GI: soft, nontender, nondistended, + BS MS: no deformity or atrophy  Skin: warm and dry Neuro:  Strength and sensation are intact Psych: euthymic mood, full affect  EKG:  EKG is not ordered today. Personal review of the ekg ordered 01/18/18 shows sinus rhythm, inferior lateral T wave inversions, PACs  Recent Labs: 12/01/2017: ALT 11; TSH 2.77 01/11/2018: BUN 14; Creatinine, Ser 0.91; Hemoglobin 14.7; Platelets 160; Potassium 4.3; Sodium 142    Lipid Panel     Component Value Date/Time   CHOL 148 12/01/2017 1404   CHOL 253 (H) 03/07/2016 0930   TRIG 166.0 (H) 12/01/2017 1404   HDL 36.50 (L) 12/01/2017 1404   HDL 32 (L) 03/07/2016 0930   CHOLHDL 4 12/01/2017 1404   VLDL 33.2 12/01/2017 1404   LDLCALC 78 12/01/2017 1404   LDLCALC 183 (H) 03/07/2016 0930   LDLDIRECT 74.0 09/30/2016 1458     Wt Readings from Last 3 Encounters:  01/26/18 145 lb (65.8 kg)    01/18/18 144 lb 12 oz (65.7 kg)  01/13/18  146 lb (66.2 kg)      Other studies Reviewed: Additional studies/ records that were reviewed today include: TTE 12/10/17  Review of the above records today demonstrates:  - Left ventricle: The cavity size was normal. Wall thickness was   increased in a pattern of mild LVH. Systolic function was normal.   The estimated ejection fraction was in the range of 55% to 60%.   Wall motion was normal; there were no regional wall motion   abnormalities. - Aortic valve: Valve area (VTI): 1.43 cm^2. Valve area (Vmax):   1.23 cm^2. Valve area (Vmean): 1.24 cm^2. - Mitral valve: There was mild to moderate regurgitation. - Left atrium: The atrium was mildly dilated. - Right atrium: The atrium was moderately to severely dilated. - Tricuspid valve: There was moderate regurgitation. - Pulmonary arteries: PA peak pressure: 49 mm Hg (S).  Holter 01/10/18 - personally reviewed Normal sinus rhythm Rare episodes of SVT/atrial tachycardia, 130 runs longest run 14 beats with rate 138 bpm Frequent PACs, 7%  Rare PVCs, 1% Rare PVCs and bigemeny  ASSESSMENT AND PLAN:  1.  SVT: Limited with rate control options due to underlying chronic bradycardia.  She also has COPD and thus amiodarone would not be an option.  Not a candidate for flecainide due to underlying CAD.  I discussed with her options of therapy with Multitak versus ablation.  Risks and benefits of ablation were discussed and include bleeding, tamponade, heart block, stroke.  She understands the risks and is agreed to the procedure.  2.  Hypertension: Elevated today.  Primary cardiology has been working on this in the past with her.  Plan per primary cardiology  3.  Coronary artery disease status post CABG without angina: No current chest pain.  Continue Plavix  4.  Right atrial enlargement: Likely due to pulmonary hypertension.  Awaiting pulmonary evaluation.    Current medicines are reviewed at  length with the patient today.   The patient does not have concerns regarding her medicines.  The following changes were made today:  none  Labs/ tests ordered today include:  No orders of the defined types were placed in this encounter.    Disposition:   FU with Will Camnitz 3 months  Signed, Will Meredith Leeds, MD  01/26/2018 3:39 PM     [place] [address] [place] [address] [phone] (office) [phone] (fax)

## 2018-01-26 NOTE — Telephone Encounter (Signed)
Patient calling because she has not heard anything from [facility] about o2 .  Please call.

## 2018-01-26 NOTE — Addendum Note (Signed)
Addended by: Stanton Kidney on: 01/26/2018 03:57 PM   Modules accepted: Orders

## 2018-01-26 NOTE — Patient Instructions (Addendum)
Medication Instructions:  Your physician recommends that you continue on your current medications as directed. Please refer to the Current Medication list given to you today.  * If you need a refill on your cardiac medications before your next appointment, please call your pharmacy.   Labwork: None ordered  Testing/Procedures: Your physician has recommended that you have an ablation. Catheter ablation is a medical procedure used to treat some cardiac arrhythmias (irregular heartbeats). During catheter ablation, a long, thin, flexible tube is put into a blood vessel in your groin (upper thigh), or neck. This tube is called an ablation catheter. It is then guided to your heart through the blood vessel. Radio frequency waves destroy small areas of heart tissue where abnormal heartbeats may cause an arrhythmia to start. Please see the instruction sheet given to you today.  Instructions for your ablation: 1. Please arrive at the [facility] Main Entrance "A", of [facility] at 5:30 a.m. on 03/19/2018. 2. Do not eat or drink after midnight the night prior to the procedure. 3. Do not take any medications the morning of the procedure. 4. Plan for an overnight stay in the hospital. 5. You will need someone to drive you home at discharge.   Follow-Up: Your physician recommends that you schedule a follow-up appointment between: 10/4 - 10/23 with Dr. Curt Bears for H&P and lab work.  Your physician recommends that you schedule a follow-up appointment in: 4 weeks, after your ablation on 03/19/2018, with Dr. Curt Bears.  *Please note that any paperwork needing to be filled out by the provider will need to be addressed at the front desk prior to seeing the provider. Please note that any FMLA, disability or other documents regarding health condition is subject to a $25.00 charge that must be received prior to completion of paperwork in the form of a money order or check.  Thank you for choosing [facility]!!   Trinidad Curet, RN [phone]  Any Other Special Instructions Will Be Listed Below (If Applicable).    Cardiac Ablation Cardiac ablation is a procedure to disable (ablate) a small amount of heart tissue in very specific places. The heart has many electrical connections. Sometimes these connections are abnormal and can cause the heart to beat very fast or irregularly. Ablating some of the problem areas can improve the heart rhythm or return it to normal. Ablation may be done for people who:  Have Wolff-Parkinson-White syndrome.  Have fast heart rhythms (tachycardia).  Have taken medicines for an abnormal heart rhythm (arrhythmia) that were not effective or caused side effects.  Have a high-risk heartbeat that may be life-threatening.  During the procedure, a small incision is made in the neck or the groin, and a long, thin, flexible tube (catheter) is inserted into the incision and moved to the heart. Small devices (electrodes) on the tip of the catheter will send out electrical currents. A type of X-ray (fluoroscopy) will be used to help guide the catheter and to provide images of the heart. Tell a health care provider about:  Any allergies you have.  All medicines you are taking, including vitamins, herbs, eye drops, creams, and over-the-counter medicines.  Any problems you or family members have had with anesthetic medicines.  Any blood disorders you have.  Any surgeries you have had.  Any medical conditions you have, such as kidney failure.  Whether you are pregnant or may be pregnant. What are the risks? Generally, this is a safe procedure. However, problems may occur, including:  Infection.  Bruising and bleeding at the catheter insertion site.  Bleeding into the chest, especially into the sac that surrounds the heart. This is a serious complication.  Stroke or blood clots.  Damage to other structures or organs.  Allergic reaction to medicines or  dyes.  Need for a permanent pacemaker if the normal electrical system is damaged. A pacemaker is a small computer that sends electrical signals to the heart and helps your heart beat normally.  The procedure not being fully effective. This may not be recognized until months later. Repeat ablation procedures are sometimes required.  What happens before the procedure?  Follow instructions from your health care provider about eating or drinking restrictions.  Ask your health care provider about: ? Changing or stopping your regular medicines. This is especially important if you are taking diabetes medicines or blood thinners. ? Taking medicines such as aspirin and ibuprofen. These medicines can thin your blood. Do not take these medicines before your procedure if your health care provider instructs you not to.  Plan to have someone take you home from the hospital or clinic.  If you will be going home right after the procedure, plan to have someone with you for 24 hours. What happens during the procedure?  To lower your risk of infection: ? Your health care team will wash or sanitize their hands. ? Your skin will be washed with soap. ? Hair may be removed from the incision area.  An IV tube will be inserted into one of your veins.  You will be given a medicine to help you relax (sedative).  The skin on your neck or groin will be numbed.  An incision will be made in your neck or your groin.  A needle will be inserted through the incision and into a large vein in your neck or groin.  A catheter will be inserted into the needle and moved to your heart.  Dye may be injected through the catheter to help your surgeon see the area of the heart that needs treatment.  Electrical currents will be sent from the catheter to ablate heart tissue in desired areas. There are three types of energy that may be used to ablate heart tissue: ? Heat (radiofrequency energy). ? Laser energy. ? Extreme  cold (cryoablation).  When the necessary tissue has been ablated, the catheter will be removed.  Pressure will be held on the catheter insertion area to prevent excessive bleeding.  A bandage (dressing) will be placed over the catheter insertion area. The procedure may vary among health care providers and hospitals. What happens after the procedure?  Your blood pressure, heart rate, breathing rate, and blood oxygen level will be monitored until the medicines you were given have worn off.  Your catheter insertion area will be monitored for bleeding. You will need to lie still for a few hours to ensure that you do not bleed from the catheter insertion area.  Do not drive for 24 hours or as long as directed by your health care provider. Summary  Cardiac ablation is a procedure to disable (ablate) a small amount of heart tissue in very specific places. Ablating some of the problem areas can improve the heart rhythm or return it to normal.  During the procedure, electrical currents will be sent from the catheter to ablate heart tissue in desired areas. This information is not intended to replace advice given to you by your health care provider. Make sure you discuss any questions you have  with your health care provider. Document Released: 09/28/2008 Document Revised: 03/31/2016 Document Reviewed: 03/31/2016 Elsevier Interactive Patient Education  [employer].

## 2018-01-27 DIAGNOSIS — I270 Primary pulmonary hypertension: Secondary | ICD-10-CM | POA: Diagnosis not present

## 2018-01-27 DIAGNOSIS — J449 Chronic obstructive pulmonary disease, unspecified: Secondary | ICD-10-CM | POA: Diagnosis not present

## 2018-01-27 NOTE — Telephone Encounter (Signed)
Called and spoke with patient after contacting [facility]. Pt stated that [facility] would be delivering 02 today. Advised patient that I would check with her tomorrow to make sure she has 02. Rhonda J Cobb

## 2018-01-28 NOTE — Telephone Encounter (Signed)
02 has been delivered by [facility] on 01/27/18 per patient. Nothing else needed at this time. Rhonda J Cobb

## 2018-01-31 ENCOUNTER — Other Ambulatory Visit: Payer: Self-pay | Admitting: Pulmonary Disease

## 2018-02-01 ENCOUNTER — Other Ambulatory Visit: Payer: Self-pay | Admitting: Cardiovascular Disease

## 2018-02-05 ENCOUNTER — Other Ambulatory Visit: Payer: Self-pay | Admitting: Pulmonary Disease

## 2018-02-09 DIAGNOSIS — D485 Neoplasm of uncertain behavior of skin: Secondary | ICD-10-CM | POA: Diagnosis not present

## 2018-02-09 DIAGNOSIS — C44311 Basal cell carcinoma of skin of nose: Secondary | ICD-10-CM | POA: Diagnosis not present

## 2018-02-09 DIAGNOSIS — D0462 Carcinoma in situ of skin of left upper limb, including shoulder: Secondary | ICD-10-CM | POA: Diagnosis not present

## 2018-02-17 ENCOUNTER — Other Ambulatory Visit: Payer: Self-pay | Admitting: Pulmonary Disease

## 2018-02-17 NOTE — Telephone Encounter (Signed)
Per last office visit Symbicort was discontinued. Pt is to remain on Anoro.

## 2018-02-18 ENCOUNTER — Other Ambulatory Visit: Payer: Self-pay | Admitting: Cardiovascular Disease

## 2018-02-19 ENCOUNTER — Telehealth: Payer: Self-pay | Admitting: Cardiovascular Disease

## 2018-02-19 NOTE — Telephone Encounter (Signed)
-----   Message from Anselm Pancoast, [facility] sent at 02/18/2018  2:05 PM EDT ----- Patient needs a follow up appointment. Thanks, Ivin Booty

## 2018-02-19 NOTE — Telephone Encounter (Signed)
Lmov for patient to schedule

## 2018-02-22 ENCOUNTER — Other Ambulatory Visit: Payer: Self-pay | Admitting: Family Medicine

## 2018-02-23 NOTE — Telephone Encounter (Signed)
Patient is following Dr Curt Bears in [facility]

## 2018-02-26 DIAGNOSIS — I270 Primary pulmonary hypertension: Secondary | ICD-10-CM | POA: Diagnosis not present

## 2018-02-26 DIAGNOSIS — J449 Chronic obstructive pulmonary disease, unspecified: Secondary | ICD-10-CM | POA: Diagnosis not present

## 2018-03-01 ENCOUNTER — Telehealth: Payer: Self-pay | Admitting: Cardiology

## 2018-03-01 NOTE — Telephone Encounter (Signed)
New Message   Patient is calling because she states that she received a call from nursing telling her to contact her. Please call.

## 2018-03-01 NOTE — Telephone Encounter (Signed)
Informed of new time to arrive for her SVT ablation on 10/25. Made aware to arrive at 11:30 am for her procedure. Pt agreeable to plan.

## 2018-03-02 ENCOUNTER — Encounter: Payer: Self-pay | Admitting: Family Medicine

## 2018-03-02 ENCOUNTER — Ambulatory Visit (HOSPITAL_BASED_OUTPATIENT_CLINIC_OR_DEPARTMENT_OTHER)
Admission: RE | Admit: 2018-03-02 | Discharge: 2018-03-02 | Disposition: A | Discharge status: Home / Self Care | Payer: PPO | Source: Ambulatory Visit | Attending: Family Medicine | Admitting: Family Medicine

## 2018-03-02 ENCOUNTER — Ambulatory Visit (INDEPENDENT_AMBULATORY_CARE_PROVIDER_SITE_OTHER): Payer: PPO | Admitting: Family Medicine

## 2018-03-02 VITALS — BP 108/70 | HR 46 | Temp 97.8°F | Resp 18 | Wt 145.8 lb

## 2018-03-02 DIAGNOSIS — Z951 Presence of aortocoronary bypass graft: Secondary | ICD-10-CM | POA: Diagnosis not present

## 2018-03-02 DIAGNOSIS — I10 Essential (primary) hypertension: Secondary | ICD-10-CM

## 2018-03-02 DIAGNOSIS — R05 Cough: Secondary | ICD-10-CM | POA: Diagnosis not present

## 2018-03-02 DIAGNOSIS — R0683 Snoring: Secondary | ICD-10-CM | POA: Diagnosis not present

## 2018-03-02 DIAGNOSIS — E039 Hypothyroidism, unspecified: Secondary | ICD-10-CM | POA: Diagnosis not present

## 2018-03-02 DIAGNOSIS — R059 Cough, unspecified: Secondary | ICD-10-CM

## 2018-03-02 DIAGNOSIS — K219 Gastro-esophageal reflux disease without esophagitis: Secondary | ICD-10-CM

## 2018-03-02 DIAGNOSIS — R109 Unspecified abdominal pain: Secondary | ICD-10-CM

## 2018-03-02 DIAGNOSIS — R739 Hyperglycemia, unspecified: Secondary | ICD-10-CM

## 2018-03-02 DIAGNOSIS — I2510 Atherosclerotic heart disease of native coronary artery without angina pectoris: Secondary | ICD-10-CM | POA: Diagnosis not present

## 2018-03-02 DIAGNOSIS — R0681 Apnea, not elsewhere classified: Secondary | ICD-10-CM

## 2018-03-02 DIAGNOSIS — J449 Chronic obstructive pulmonary disease, unspecified: Secondary | ICD-10-CM | POA: Insufficient documentation

## 2018-03-02 DIAGNOSIS — I700 Atherosclerosis of aorta: Secondary | ICD-10-CM | POA: Diagnosis not present

## 2018-03-02 MED ORDER — FAMOTIDINE 40 MG PO TABS: 40.0000 mg | ORAL_TABLET | Freq: Every day | ORAL | 3 refills | Status: DC

## 2018-03-02 MED ORDER — OMEPRAZOLE 20 MG PO CPDR: 20.0000 mg | DELAYED_RELEASE_CAPSULE | Freq: Every day | ORAL | 3 refills | Status: DC

## 2018-03-02 MED ORDER — HYDROCODONE-HOMATROPINE 5-1.5 MG/5ML PO SYRP: 5.0000 mL | ORAL_SOLUTION | Freq: Three times a day (TID) | ORAL | 0 refills | Status: DC | PRN

## 2018-03-02 MED ORDER — DOXYCYCLINE HYCLATE 100 MG PO TABS: 100.0000 mg | ORAL_TABLET | Freq: Two times a day (BID) | ORAL | 0 refills | Status: DC

## 2018-03-02 NOTE — Patient Instructions (Addendum)
Melatonin 2-10 mg at bedtime try this first  Unisom is over the counter for sleep trouble   shingrix is the new shingles shot, 2 shots over 2-6 months  Encouraged increased rest and hydration, add probiotics, zinc such as Coldeze or Xicam. Treat fevers as needed. Zinc, Vitamin C and elderberry liquid daily, plain Mucinex twice daily to help with respiratory illness Cough, Adult Coughing is a reflex that clears your throat and your airways. Coughing helps to heal and protect your lungs. It is normal to cough occasionally, but a cough that happens with other symptoms or lasts a long time may be a sign of a condition that needs treatment. A cough may last only 2-3 weeks (acute), or it may last longer than 8 weeks (chronic). What are the causes? Coughing is commonly caused by:  Breathing in substances that irritate your lungs.  A viral or bacterial respiratory infection.  Allergies.  Asthma.  Postnasal drip.  Smoking.  Acid backing up from the stomach into the esophagus (gastroesophageal reflux).  Certain medicines.  Chronic lung problems, including COPD (or rarely, lung cancer).  Other medical conditions such as heart failure.  Follow these instructions at home: Pay attention to any changes in your symptoms. Take these actions to help with your discomfort:  Take medicines only as told by your health care provider. ? If you were prescribed an antibiotic medicine, take it as told by your health care provider. Do not stop taking the antibiotic even if you start to feel better. ? Talk with your health care provider before you take a cough suppressant medicine.  Drink enough fluid to keep your urine clear or pale yellow.  If the air is dry, use a cold steam vaporizer or humidifier in your bedroom or your home to help loosen secretions.  Avoid anything that causes you to cough at work or at home.  If your cough is worse at night, try sleeping in a semi-upright position.  Avoid  cigarette smoke. If you smoke, quit smoking. If you need help quitting, ask your health care provider.  Avoid caffeine.  Avoid alcohol.  Rest as needed.  Contact a health care provider if:  You have new symptoms.  You cough up pus.  Your cough does not get better after 2-3 weeks, or your cough gets worse.  You cannot control your cough with suppressant medicines and you are losing sleep.  You develop pain that is getting worse or pain that is not controlled with pain medicines.  You have a fever.  You have unexplained weight loss.  You have night sweats. Get help right away if:  You cough up blood.  You have difficulty breathing.  Your heartbeat is very fast. This information is not intended to replace advice given to you by your health care provider. Make sure you discuss any questions you have with your health care provider. Document Released: 11/08/2010 Document Revised: 10/18/2015 Document Reviewed: 07/19/2014 Elsevier Interactive Patient Education  [employer].

## 2018-03-03 LAB — URINALYSIS
Bilirubin Urine: NEGATIVE
HGB URINE DIPSTICK: NEGATIVE
KETONES UR: NEGATIVE
Leukocytes, UA: NEGATIVE
Nitrite: NEGATIVE
Specific Gravity, Urine: <=1.005 — AB (ref 1.000–1.030)
Total Protein, Urine: NEGATIVE
URINE GLUCOSE: NEGATIVE
UROBILINOGEN UA: 0.2 (ref 0.0–1.0)
pH: 6.5 (ref 5.0–8.0)

## 2018-03-03 LAB — URINE CULTURE
MICRO NUMBER: 91208430
SPECIMEN QUALITY:: ADEQUATE

## 2018-03-07 DIAGNOSIS — R0681 Apnea, not elsewhere classified: Secondary | ICD-10-CM | POA: Insufficient documentation

## 2018-03-07 NOTE — Assessment & Plan Note (Signed)
Urine culture is negative. Encouraged moist heat and gentle stretching as tolerated. May try NSAIDs and prescription meds as directed and report if symptoms worsen or seek immediate care

## 2018-03-07 NOTE — Assessment & Plan Note (Addendum)
CXR unremarkable Encouraged increased rest and hydration, add probiotics, zinc such as Coldeze or Xicam. Treat fevers as needed. Given rx for Doxycycline and Hydromet

## 2018-03-07 NOTE — Assessment & Plan Note (Signed)
On Levothyroxine, continue to monitor 

## 2018-03-07 NOTE — Assessment & Plan Note (Signed)
Avoid offending foods, start probiotics. Do not eat large meals in late evening and consider raising head of bed.  

## 2018-03-07 NOTE — Assessment & Plan Note (Signed)
Well controlled, no changes to meds. Encouraged heart healthy diet such as the DASH diet and exercise as tolerated.  °

## 2018-03-07 NOTE — Assessment & Plan Note (Signed)
hgba1c acceptable, minimize simple carbs. Increase exercise as tolerated.  

## 2018-03-07 NOTE — Progress Notes (Signed)
Subjective:    [name], female    DOB: 1941-01-30, 77 y.o.   MRN: [id number]  No chief complaint on file.   HPI Patient is in today for follow up. She has noted a recent increase in her heartburn but denies any change in her diet. She has developed recent respiratory symptoms. She has a cough that is keeping her up at night. She has trouble sleeping. No fevers or chills. Denies CP/palp/SOB/HA/fevers/GI or GU c/o. Taking meds as prescribed  Past Medical History:  Diagnosis Date  . AAA (abdominal aortic aneurysm) ([place]) 01/2009   AAA (2.8 x 3.0)  moderate RAS (left); 2.7 x 2.7 cm (07/11/05)  . Acute bronchitis 03/20/2013; 2017  . Angina   . Anosmia   . Asthma   . Baker's cyst of knee 01/30/2014  . Basal cell carcinoma    "back and left arm"  . Bradycardia    Metoprolol stopped 08/2011  . CAD (coronary artery disease)    s/CABG (reports IMA and 2 SVGs) back in 1999  Myoview normal 3/10; s/p PCI with DES to PL branch of distal RCA 09/2011; PCI +DES to SVG-RCA, PCI + DES to mid LCx 12/2015  . Cerebrovascular disease 01/2009   carotid u/s  R 0-39%   L 60-79%  . Chronic thoracic back pain   . COPD (chronic obstructive pulmonary disease) ([place])   . Depression   . Dizziness   . Dysrhythmia    hx of sinus brady  . GERD (gastroesophageal reflux disease)   . Heart murmur   . Herniated lumbar disc without myelopathy   . History of blood transfusion 1999   "when I had the bypass; had a PE"  . Hyperglycemia 10/23/2015  . Hyperlipidemia   . Hypertension   . Hypothyroidism 09/30/2016  . Medicare annual wellness visit, subsequent 07/31/2013  . NSTEMI (non-ST elevated myocardial infarction) ([place]) 11/12   Cath showed atretic IMA graft to the LAD, SVG to PD was patent but the continuation of this graft to the PL branch was occluded; there were L to R collaterals; Mid LAD had a 60 to 70% stenosis. She has been treated medically.  Neg Myoview 05/2011  . Osteoarthritis of back   .  Osteoporosis   . Pneumonia "several times"  . Pulmonary embolism ([place]) 1999   "after my bypass"  . PVD (peripheral vascular disease) ([place])   . Shortness of breath   . Squamous carcinoma    "nose"  . Thyroid disease    Hypothyroid  . Unstable angina ([place]) 09/25/2015    Past Surgical History:  Procedure Laterality Date  . ABDOMINAL AORTIC ANEURYSM REPAIR     pt denies this hx on 01/02/2016  . BASAL CELL CARCINOMA EXCISION    . CARDIAC CATHETERIZATION N/A 01/02/2016   Procedure: Left Heart Cath and Coronary Angiography;  Surgeon: Wellington Hampshire, MD;  Location: [facility];  Service: Cardiovascular;  Laterality: N/A;  . CARDIAC CATHETERIZATION  1996; 1999  . CARDIAC CATHETERIZATION N/A 06/25/2016   Procedure: Left Heart Cath and Cors/Grafts Angiography;  Surgeon: Wellington Hampshire, MD;  Location: [facility];  Service: Cardiovascular;  Laterality: N/A;  . CAROTID ENDARTERECTOMY Left 01/02/2011  . CATARACT EXTRACTION W/ INTRAOCULAR LENS  IMPLANT, BILATERAL    . CLOSED REDUCTION NASAL FRACTURE  11/2007  . CORONARY ANGIOPLASTY WITH STENT PLACEMENT  10/10/2011   drug eluting  to rc & saphenous  . CORONARY ARTERY BYPASS GRAFT  1999   "  CABG X3"  . DILATION AND CURETTAGE OF UTERUS    . FEMORAL ARTERY STENT Bilateral   . FRACTURE SURGERY    . LEFT HEART CATHETERIZATION WITH CORONARY/GRAFT ANGIOGRAM N/A 04/22/2011   Procedure: LEFT HEART CATHETERIZATION WITH Beatrix Fetters;  Surgeon: Jolaine Artist, MD;  [facility] C[facility];  Service: Cardiovascular;  Laterality: N/A;  . LEFT HEART CATHETERIZATION WITH CORONARY/GRAFT ANGIOGRAM N/A 10/10/2011   Procedure: LEFT HEART CATHETERIZATION WITH Beatrix Fetters;  Surgeon: Peter M Martinique, MD;  Location: [facility] C[facility];  Service: Cardiovascular;  Laterality: N/A;  . NASAL SINUS SURGERY     twice  . SQUAMOUS CELL CARCINOMA EXCISION    . TUBAL LIGATION      Family History  Problem Relation Age of Onset  . Heart attack  Mother 52  . Diabetes Mother   . Colon cancer Father 73  . Lung cancer Father        smoked  . Heart disease Father        angina  . Hypertension Father   . Cancer Father        colon, lung  . Lung cancer Sister        smoked  . Cancer Sister   . Hypothyroidism Sister   . Hypertension Brother   . Heart attack Brother   . Heart disease Brother        stent  . Heart attack Brother        CABG  . Heart disease Brother        CABG with 1 bypass  . Aneurysm Brother        brain  . Alcohol abuse Brother   . Hyperlipidemia Daughter   . Diabetes Maternal Grandmother   . Stroke Maternal Grandmother   . Cirrhosis Maternal Grandfather   . Stroke Paternal Grandmother   . Obesity Daughter   . Obesity Son     Social History   Socioeconomic History  . Marital status: Married    Spouse name: Not on file  . Number of children: 5  . Years of education: Not on file  . Highest education level: Not on file  Occupational History  . 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 Financial resource strain: Not on file  . Food insecurity:    Worry: Not on file    Inability: Not on file  . Transportation needs:    Medical: Not on file    Non-medical: Not on file  Tobacco Use  . Smoking status: Former Smoker    Packs/day: 0.50    Years: 40.00    Pack years: 20.00    Types: Cigarettes    Last attempt to quit: 01/19/1993    Years since quitting: 25.1  . Smokeless tobacco: Never Used  Substance and Sexual Activity  . Alcohol use: Yes    Comment: 01/02/2016 "might drink a glass of wine socially q 3-4 months"  . Drug use: No  . Sexual activity: Not Currently    Birth control/protection: Post-menopausal  Lifestyle  . Physical activity:    Days per week: Not on file    Minutes per session: Not on file  . Stress: Not on file  Relationships  . Social connections:    Talks on phone: Not on file    Gets together: Not on file    Attends  religious service: Not on file    Active member of club or  organization: Not on file    Attends meetings of clubs or organizations: Not on file    Relationship status: Not on file  . Intimate partner violence:    Fear of current or ex partner: Not on file    Emotionally abused: Not on file    Physically abused: Not on file    Forced sexual activity: Not on file  Other Topics Concern  . Not on file  Social History Narrative   Married with 5 children    Outpatient Medications Prior to Visit  Medication Sig Dispense Refill  . acetaminophen (TYLENOL) 650 MG CR tablet Take 1,300 mg by mouth 2 (two) times daily.    Marland Kitchen albuterol (PROVENTIL HFA;VENTOLIN HFA) 108 (90 Base) MCG/ACT inhaler INHALE 2 PUFFS INTO THE LUNGS EVERY 6 (SIX) HOURS AS NEEDED FOR WHEEZING. 8.5 Inhaler 1  . amLODipine (NORVASC) 10 MG tablet Take 1 tablet (10 mg total) by mouth daily. 180 tablet 3  . Calcium Carbonate-Vitamin D (CALCIUM + D PO) Take 1 tablet by mouth 2 (two) times daily.    . clopidogrel (PLAVIX) 75 MG tablet TAKE 1 TABLET BY MOUTH EVERY DAY 30 tablet 0  . fluticasone (FLONASE) 50 MCG/ACT nasal spray Place 2 sprays into both nostrils daily. For congestion 48 g 3  . furosemide (LASIX) 20 MG tablet Take 1 tablet (20 mg) by mouth once a day on Mondays, Wednesdays, & Fridays    . isosorbide mononitrate (IMDUR) 60 MG 24 hr tablet Take 1 tablet (60 mg total) by mouth 2 (two) times daily. 180 tablet 3  . Krill Oil 1000 MG CAPS Take 1,000 mg by mouth daily.    Marland Kitchen levothyroxine (SYNTHROID, LEVOTHROID) 25 MCG tablet TAKE 1 TABLET (25 MCG TOTAL) BY MOUTH DAILY BEFORE BREAKFAST. 90 tablet 3  . metoprolol tartrate (LOPRESSOR) 25 MG tablet Take 25 mg by mouth daily.     . nitroGLYCERIN (NITROSTAT) 0.4 MG SL tablet PLACE 1 TABLET (0.4 MG TOTAL) UNDER THE TONGUE EVERY 5 (FIVE) MINUTES AS NEEDED FOR CHEST PAIN. 25 tablet 0  . Polyethyl Glycol-Propyl Glycol (SYSTANE OP) Place 1 drop into both eyes 3 (three) times daily as needed  (dry eyes).     . potassium chloride (K-DUR) 10 MEQ tablet Take 1 tablet (10 mEq total) by mouth daily as needed. (Patient taking differently: Take 10-20 mEq by mouth daily as needed (for cramping/lab results.). ) 90 tablet 3  . rosuvastatin (CRESTOR) 40 MG tablet TAKE 1 TABLET (40 MG TOTAL) BY MOUTH DAILY. 90 tablet 3  . sertraline (ZOLOFT) 50 MG tablet TAKE 1 TABLET BY MOUTH EVERY DAY 90 tablet 1  . umeclidinium-vilanterol (ANORO ELLIPTA) 62.5-25 MCG/INH AEPB Inhale 1 puff into the lungs daily. 60 each 5  . Vitamin D, Ergocalciferol, (DRISDOL) 50000 units CAPS capsule TAKE 1 CAPSULE (50,000 UNITS TOTAL) BY MOUTH EVERY 7 (SEVEN) DAYS. 12 capsule 1  . famotidine (PEPCID) 20 MG tablet Take 1 tablet (20 mg total) by mouth 2 (two) times daily. Take 1 tablet (20 mg) by mouth twice daily 180 tablet 1  . HYDROcodone-acetaminophen (NORCO) 10-325 MG tablet Take 1 tablet by mouth every 6 (six) hours as needed. Back pain 20 tablet 0   No facility-administered medications prior to visit.     Allergies  Allergen Reactions  . Codeine Nausea And Vomiting    REACTION: nausea/vomiting  . Erythromycin Other (See Comments)    REACTION: tongue burns  . Meperidine Hcl Nausea And Vomiting    REACTION: Nausea/vomiting  .  Shellfish Allergy Nausea And Vomiting  . Ciprofloxacin Rash    REACTION: rash IV  . Penicillins Rash    Has patient had a PCN reaction causing immediate rash, facial/tongue/throat swelling, SOB or lightheadedness with hypotension:unsure Has patient had a PCN reaction causing severe rash involving mucus membranes or skin necrosis:No Has patient had a PCN reaction that required hospitalization:No Has patient had a PCN reaction occurring within the last 10 years:No If all of the above answers are "NO", then may proceed with Cephalosporin use.   . Tape Rash    Review of Systems  Constitutional: Negative for fever and malaise/fatigue.  HENT: Positive for congestion.   Eyes: Negative for  blurred vision.  Respiratory: Positive for cough. Negative for shortness of breath.   Cardiovascular: Negative for chest pain, palpitations and leg swelling.  Gastrointestinal: Positive for heartburn. Negative for abdominal pain, blood in stool and nausea.  Genitourinary: Negative for dysuria and frequency.  Musculoskeletal: Negative for falls.  Skin: Negative for rash.  Neurological: Negative for dizziness, loss of consciousness and headaches.  Endo/Heme/Allergies: Negative for environmental allergies.  Psychiatric/Behavioral: Negative for depression. The patient has insomnia. The patient is not nervous/anxious.        Objective:    Physical Exam  Constitutional: She is oriented to person, place, and time. She appears well-developed and well-nourished. No distress.  HENT:  Head: Normocephalic and atraumatic.  Nose: Nose normal.  Eyes: Right eye exhibits no discharge. Left eye exhibits no discharge.  Neck: Normal range of motion. Neck supple.  Cardiovascular: Normal rate and regular rhythm.  No murmur heard. Pulmonary/Chest: Effort normal and breath sounds normal.  Abdominal: Soft. Bowel sounds are normal. There is no tenderness.  Musculoskeletal: She exhibits no edema.  Neurological: She is alert and oriented to person, place, and time.  Skin: Skin is warm and dry.  Psychiatric: She has a normal mood and affect.  Nursing note and vitals reviewed.   BP 108/70 (BP Location: Left Arm, Patient Position: Sitting, Cuff Size: Normal)   Pulse (!) 46   Temp 97.8 F (36.6 C) (Oral)   Resp 18   Wt 145 lb 12.8 oz (66.1 kg)   SpO2 (!) 88%   BMI 26.67 kg/m  Wt Readings from Last 3 Encounters:  03/02/18 145 lb 12.8 oz (66.1 kg)  01/26/18 145 lb (65.8 kg)  01/18/18 144 lb 12 oz (65.7 kg)     Lab Results  Component Value Date   WBC 6.4 01/11/2018   HGB 14.7 01/11/2018   HCT 43.3 01/11/2018   PLT 160 01/11/2018   GLUCOSE 104 (H) 01/11/2018   CHOL 148 12/01/2017   TRIG 166.0  (H) 12/01/2017   HDL 36.50 (L) 12/01/2017   LDLDIRECT 74.0 09/30/2016   LDLCALC 78 12/01/2017   ALT 11 12/01/2017   AST 16 12/01/2017   NA 142 01/11/2018   K 4.3 01/11/2018   CL 103 01/11/2018   CREATININE 0.91 01/11/2018   BUN 14 01/11/2018   CO2 31 01/11/2018   TSH 2.77 12/01/2017   INR 1.0 06/17/2016   HGBA1C 6.2 12/01/2017   MICROALBUR 6.9 (H) 12/01/2017    Lab Results  Component Value Date   TSH 2.77 12/01/2017   Lab Results  Component Value Date   WBC 6.4 01/11/2018   HGB 14.7 01/11/2018   HCT 43.3 01/11/2018   MCV 83.3 01/11/2018   PLT 160 01/11/2018   Lab Results  Component Value Date   NA 142 01/11/2018   K 4.3 01/11/2018  CO2 31 01/11/2018   GLUCOSE 104 (H) 01/11/2018   BUN 14 01/11/2018   CREATININE 0.91 01/11/2018   BILITOT 0.6 12/01/2017   ALKPHOS 85 12/01/2017   AST 16 12/01/2017   ALT 11 12/01/2017   PROT 7.0 12/01/2017   ALBUMIN 4.4 12/01/2017   CALCIUM 9.3 01/11/2018   ANIONGAP 8 01/11/2018   GFR 66.81 12/01/2017   Lab Results  Component Value Date   CHOL 148 12/01/2017   Lab Results  Component Value Date   HDL 36.50 (L) 12/01/2017   Lab Results  Component Value Date   LDLCALC 78 12/01/2017   Lab Results  Component Value Date   TRIG 166.0 (H) 12/01/2017   Lab Results  Component Value Date   CHOLHDL 4 12/01/2017   Lab Results  Component Value Date   HGBA1C 6.2 12/01/2017       Assessment & Plan:   Problem List Items Addressed This Visit    HTN (hypertension)    Well controlled, no changes to meds. Encouraged heart healthy diet such as the DASH diet and exercise as tolerated.       GERD    Avoid offending foods, start probiotics. Do not eat large meals in late evening and consider raising head of bed.       Relevant Medications   famotidine (PEPCID) 40 MG tablet   omeprazole (PRILOSEC) 20 MG capsule   Cough    CXR unremarkable Encouraged increased rest and hydration, add probiotics, zinc such as Coldeze or Xicam.  Treat fevers as needed      Relevant Orders   DG Chest 2 View (Completed)   Hyperglycemia    hgba1c acceptable, minimize simple carbs. Increase exercise as tolerated.      Flank pain    Urine culture is negative. Encouraged moist heat and gentle stretching as tolerated. May try NSAIDs and prescription meds as directed and report if symptoms worsen or seek immediate care      Relevant Orders   Urinalysis (Completed)   Urine Culture (Completed)   Hypothyroidism    On Levothyroxine, continue to monitor      Witnessed episode of apnea - Primary   Relevant Orders   Ambulatory referral to Pulmonology    Other Visit Diagnoses    Snoring       Relevant Orders   Ambulatory referral to Pulmonology      I have discontinued Leda Roys HYDROcodone-acetaminophen and famotidine. I am also having her start on famotidine, omeprazole, doxycycline, and HYDROcodone-homatropine. Additionally, I am having her maintain her Calcium Carbonate-Vitamin D (CALCIUM + D PO), acetaminophen, Polyethyl Glycol-Propyl Glycol (SYSTANE OP), Krill Oil, potassium chloride, isosorbide mononitrate, fluticasone, levothyroxine, rosuvastatin, metoprolol tartrate, furosemide, sertraline, umeclidinium-vilanterol, amLODipine, albuterol, nitroGLYCERIN, clopidogrel, and Vitamin D (Ergocalciferol).  Meds ordered this encounter  Medications  . famotidine (PEPCID) 40 MG tablet    Sig: Take 1 tablet (40 mg total) by mouth at bedtime.    Dispense:  30 tablet    Refill:  3  . omeprazole (PRILOSEC) 20 MG capsule    Sig: Take 1 capsule (20 mg total) by mouth daily.    Dispense:  30 capsule    Refill:  3  . doxycycline (VIBRA-TABS) 100 MG tablet    Sig: Take 1 tablet (100 mg total) by mouth 2 (two) times daily.    Dispense:  20 tablet    Refill:  0  . HYDROcodone-homatropine (HYCODAN) 5-1.5 MG/5ML syrup    Sig: Take 5 mLs by mouth every 8 (  eight) hours as needed for cough.    Dispense:  120 mL    Refill:  0      [name], MD

## 2018-03-08 ENCOUNTER — Encounter: Payer: Self-pay | Admitting: *Deleted

## 2018-03-09 ENCOUNTER — Encounter: Payer: Self-pay | Admitting: Cardiology

## 2018-03-09 ENCOUNTER — Ambulatory Visit (INDEPENDENT_AMBULATORY_CARE_PROVIDER_SITE_OTHER): Payer: PPO | Admitting: Cardiology

## 2018-03-09 VITALS — BP 132/62 | HR 52 | Ht 62.0 in | Wt 146.0 lb

## 2018-03-09 DIAGNOSIS — I25810 Atherosclerosis of coronary artery bypass graft(s) without angina pectoris: Secondary | ICD-10-CM | POA: Diagnosis not present

## 2018-03-09 DIAGNOSIS — I10 Essential (primary) hypertension: Secondary | ICD-10-CM

## 2018-03-09 DIAGNOSIS — I471 Supraventricular tachycardia: Secondary | ICD-10-CM | POA: Diagnosis not present

## 2018-03-09 DIAGNOSIS — Z01812 Encounter for preprocedural laboratory examination: Secondary | ICD-10-CM

## 2018-03-09 NOTE — Progress Notes (Signed)
Electrophysiology Office Note   Date:  03/09/2018   ID:  Andrea [name], DOB 01-18-1941, MRN [id number]  PCP:  Mosie Lukes, MD  Cardiologist:  Rockey Situ Primary Electrophysiologist:  Caymen Dubray Meredith Leeds, MD    No chief complaint on file.    History of Present Illness: Andrea [name] is a 77 y.o. female who is being seen today for the evaluation of SVT at the request of Andrea [name]. Presenting today for electrophysiology evaluation.  She has a history of coronary disease status post CABG in 1999 status post PCI to the distal RCA in 2013, PCI x2 to the SVG to the RCA into the mid circumflex in 2017, PVD status post lower extremity stenting, carotid artery disease status post left-sided CEA, pulmonary hypertension, valvular heart disease, COPD, infrarenal AAA, PE, hypertension, hyperlipidemia, asthma, hypothyroidism.  Her a Holter monitor with episodes of SVT/atrial tachycardia, longest lasting 14 beats with frequent PACs and rare PVCs.  She has palpitations on a daily basis.  They last between 5 and 10 minutes at times.  There are no exacerbating or alleviating factors. Plan for SVT ablation 03/19/18.   Today, denies symptoms of chest pain, shortness of breath, orthopnea, PND, lower extremity edema, claudication, dizziness, presyncope, syncope, bleeding, or neurologic sequela. The patient is tolerating medications without difficulties.  Continues to have episodes of palpitations.  Her palpitations occur at all times the day.  She has not been on any medications for this.  She also has been having some lower extremity swelling despite her Lasix.   Past Medical History:  Diagnosis Date  . AAA (abdominal aortic aneurysm) ([place]) 01/2009   AAA (2.8 x 3.0)  moderate RAS (left); 2.7 x 2.7 cm (07/11/05)  . Acute bronchitis 03/20/2013; 2017  . Angina   . Anosmia   . Asthma   . Baker's cyst of knee 01/30/2014  . Basal cell carcinoma    "back and left arm"  . Bradycardia    Metoprolol stopped 08/2011    . CAD (coronary artery disease)    s/CABG (reports IMA and 2 SVGs) back in 1999  Myoview normal 3/10; s/p PCI with DES to PL branch of distal RCA 09/2011; PCI +DES to SVG-RCA, PCI + DES to mid LCx 12/2015  . Cerebrovascular disease 01/2009   carotid u/s  R 0-39%   L 60-79%  . Chronic thoracic back pain   . COPD (chronic obstructive pulmonary disease) ([place])   . Depression   . Dizziness   . Dysrhythmia    hx of sinus brady  . GERD (gastroesophageal reflux disease)   . Heart murmur   . Herniated lumbar disc without myelopathy   . History of blood transfusion 1999   "when I had the bypass; had a PE"  . Hyperglycemia 10/23/2015  . Hyperlipidemia   . Hypertension   . Hypothyroidism 09/30/2016  . Medicare annual wellness visit, subsequent 07/31/2013  . NSTEMI (non-ST elevated myocardial infarction) ([place]) 11/12   Cath showed atretic IMA graft to the LAD, SVG to PD was patent but the continuation of this graft to the PL branch was occluded; there were L to R collaterals; Mid LAD had a 60 to 70% stenosis. She has been treated medically.  Neg Myoview 05/2011  . Osteoarthritis of back   . Osteoporosis   . Pneumonia "several times"  . Pulmonary embolism ([place]) 1999   "after my bypass"  . PVD (peripheral vascular disease) ([place])   . Shortness of breath   . Squamous carcinoma    "  nose"  . Thyroid disease    Hypothyroid  . Unstable angina ([place]) 09/25/2015   Past Surgical History:  Procedure Laterality Date  . ABDOMINAL AORTIC ANEURYSM REPAIR     pt denies this hx on 01/02/2016  . BASAL CELL CARCINOMA EXCISION    . CARDIAC CATHETERIZATION N/A 01/02/2016   Procedure: Left Heart Cath and Coronary Angiography;  Surgeon: Wellington Hampshire, MD;  Location: [facility];  Service: Cardiovascular;  Laterality: N/A;  . CARDIAC CATHETERIZATION  1996; 1999  . CARDIAC CATHETERIZATION N/A 06/25/2016   Procedure: Left Heart Cath and Cors/Grafts Angiography;  Surgeon: Wellington Hampshire, MD;  Location: [facility];  Service: Cardiovascular;  Laterality: N/A;  . CAROTID ENDARTERECTOMY Left 01/02/2011  . CATARACT EXTRACTION W/ INTRAOCULAR LENS  IMPLANT, BILATERAL    . CLOSED REDUCTION NASAL FRACTURE  11/2007  . CORONARY ANGIOPLASTY WITH STENT PLACEMENT  10/10/2011   drug eluting  to rc & saphenous  . CORONARY ARTERY BYPASS GRAFT  1999   "CABG X3"  . DILATION AND CURETTAGE OF UTERUS    . FEMORAL ARTERY STENT Bilateral   . FRACTURE SURGERY    . LEFT HEART CATHETERIZATION WITH CORONARY/GRAFT ANGIOGRAM N/A 04/22/2011   Procedure: LEFT HEART CATHETERIZATION WITH Beatrix Fetters;  Surgeon: Jolaine Artist, MD;  Location: [facility];  Service: Cardiovascular;  Laterality: N/A;  . LEFT HEART CATHETERIZATION WITH CORONARY/GRAFT ANGIOGRAM N/A 10/10/2011   Procedure: LEFT HEART CATHETERIZATION WITH Beatrix Fetters;  Surgeon: Peter M Martinique, MD;  Location: [facility] CATH LAB;  Service: Cardiovascular;  Laterality: N/A;  . NASAL SINUS SURGERY     twice  . SQUAMOUS CELL CARCINOMA EXCISION    . TUBAL LIGATION       Current Outpatient Medications  Medication Sig Dispense Refill  . acetaminophen (TYLENOL) 650 MG CR tablet Take 1,300 mg by mouth 2 (two) times daily.    Marland Kitchen albuterol (PROVENTIL HFA;VENTOLIN HFA) 108 (90 Base) MCG/ACT inhaler INHALE 2 PUFFS INTO THE LUNGS EVERY 6 (SIX) HOURS AS NEEDED FOR WHEEZING. 8.5 Inhaler 1  . amLODipine (NORVASC) 10 MG tablet Take 1 tablet (10 mg total) by mouth daily. 180 tablet 3  . Calcium Carbonate-Vitamin D (CALCIUM + D PO) Take 1 tablet by mouth 2 (two) times daily.    . clopidogrel (PLAVIX) 75 MG tablet TAKE 1 TABLET BY MOUTH EVERY DAY 30 tablet 0  . doxycycline (VIBRA-TABS) 100 MG tablet Take 1 tablet (100 mg total) by mouth 2 (two) times daily. 20 tablet 0  . famotidine (PEPCID) 40 MG tablet Take 1 tablet (40 mg total) by mouth at bedtime. 30 tablet 3  . fluticasone (FLONASE) 50 MCG/ACT nasal spray Place 2 sprays into both nostrils daily. For congestion  48 g 3  . furosemide (LASIX) 20 MG tablet Take 1 tablet (20 mg) by mouth once a day on Mondays, Wednesdays, & Fridays    . HYDROcodone-homatropine (HYCODAN) 5-1.5 MG/5ML syrup Take 5 mLs by mouth every 8 (eight) hours as needed for cough. 120 mL 0  . isosorbide mononitrate (IMDUR) 60 MG 24 hr tablet Take 1 tablet (60 mg total) by mouth 2 (two) times daily. 180 tablet 3  . Krill Oil 1000 MG CAPS Take 1,000 mg by mouth daily.    Marland Kitchen levothyroxine (SYNTHROID, LEVOTHROID) 25 MCG tablet TAKE 1 TABLET (25 MCG TOTAL) BY MOUTH DAILY BEFORE BREAKFAST. 90 tablet 3  . metoprolol tartrate (LOPRESSOR) 25 MG tablet Take 25 mg by mouth daily.     Marland Kitchen  nitroGLYCERIN (NITROSTAT) 0.4 MG SL tablet PLACE 1 TABLET (0.4 MG TOTAL) UNDER THE TONGUE EVERY 5 (FIVE) MINUTES AS NEEDED FOR CHEST PAIN. 25 tablet 0  . omeprazole (PRILOSEC) 20 MG capsule Take 1 capsule (20 mg total) by mouth daily. 30 capsule 3  . Polyethyl Glycol-Propyl Glycol (SYSTANE OP) Place 1 drop into both eyes 3 (three) times daily as needed (dry eyes).     . potassium chloride (K-DUR) 10 MEQ tablet Take 1 tablet (10 mEq total) by mouth daily as needed. (Patient taking differently: Take 10-20 mEq by mouth daily as needed (for cramping/lab results.). ) 90 tablet 3  . rosuvastatin (CRESTOR) 40 MG tablet TAKE 1 TABLET (40 MG TOTAL) BY MOUTH DAILY. 90 tablet 3  . sertraline (ZOLOFT) 50 MG tablet TAKE 1 TABLET BY MOUTH EVERY DAY 90 tablet 1  . umeclidinium-vilanterol (ANORO ELLIPTA) 62.5-25 MCG/INH AEPB Inhale 1 puff into the lungs daily. 60 each 5  . Vitamin D, Ergocalciferol, (DRISDOL) 50000 units CAPS capsule TAKE 1 CAPSULE (50,000 UNITS TOTAL) BY MOUTH EVERY 7 (SEVEN) DAYS. 12 capsule 1   No current facility-administered medications for this visit.     Allergies:   Codeine; Erythromycin; Meperidine; Shellfish allergy; Ciprofloxacin; Penicillins; and Tape   Social History:  The patient  reports that she quit smoking about 25 years ago. Her smoking use  included cigarettes. She has a 20.00 pack-year smoking history. She has never used smokeless tobacco. She reports that she drinks alcohol. She reports that she does not use drugs.   Family History:  The patient's family history includes Alcohol abuse in her brother; Aneurysm in her brother; Cirrhosis in her maternal grandfather; Colon cancer (age of onset: 42) in her father; Diabetes in her maternal grandmother and mother; Heart attack in her brother and brother; Heart attack (age of onset: 36) in her mother; Heart disease in her brother, brother, and father; Hyperlipidemia in her daughter; Hypertension in her brother and father; Hypothyroidism in her sister; Lung cancer in her father and sister; Obesity in her daughter and son; Stroke in her maternal grandmother and paternal grandmother.    ROS:  Please see the history of present illness.   Otherwise, review of systems is positive for fatigue, chills, leg swelling, palpitations, cough, shortness of breath, snoring, wheezing.   All other systems are reviewed and negative.   PHYSICAL EXAM: VS:  BP 132/62   Pulse (!) 52   Ht 5\' 2"  (1.575 m)   Wt 146 lb (66.2 kg)   BMI 26.70 kg/m   , BMI Body mass index is 26.7 kg/m. GEN: Well nourished, well developed, in no acute distress  HEENT: normal  Neck: no JVD, carotid bruits, or masses Cardiac: RRR; no murmurs, rubs, or gallops,no edema  Respiratory:  clear to auscultation bilaterally, normal work of breathing GI: soft, nontender, nondistended, + BS MS: no deformity or atrophy  Skin: warm and dry Neuro:  Strength and sensation are intact Psych: euthymic mood, full affect  EKG:  EKG is not ordered today. Personal review of the ekg ordered 01/11/18 shows sinus rhythm, rate 57, inferolateral T wave inversions  Recent Labs: 12/01/2017: ALT 11; TSH 2.77 01/11/2018: BUN 14; Creatinine, Ser 0.91; Hemoglobin 14.7; Platelets 160; Potassium 4.3; Sodium 142    Lipid Panel     Component Value Date/Time    CHOL 148 12/01/2017 1404   CHOL 253 (H) 03/07/2016 0930   TRIG 166.0 (H) 12/01/2017 1404   HDL 36.50 (L) 12/01/2017 1404   HDL 32 (  L) 03/07/2016 0930   CHOLHDL 4 12/01/2017 1404   VLDL 33.2 12/01/2017 1404   LDLCALC 78 12/01/2017 1404   LDLCALC 183 (H) 03/07/2016 0930   LDLDIRECT 74.0 09/30/2016 1458     Wt Readings from Last 3 Encounters:  03/09/18 146 lb (66.2 kg)  03/02/18 145 lb 12.8 oz (66.1 kg)  01/26/18 145 lb (65.8 kg)      Other studies Reviewed: Additional studies/ records that were reviewed today include: TTE 12/10/17  Review of the above records today demonstrates:  - Left ventricle: The cavity size was normal. Wall thickness was   increased in a pattern of mild LVH. Systolic function was normal.   The estimated ejection fraction was in the range of 55% to 60%.   Wall motion was normal; there were no regional wall motion   abnormalities. - Aortic valve: Valve area (VTI): 1.43 cm^2. Valve area (Vmax):   1.23 cm^2. Valve area (Vmean): 1.24 cm^2. - Mitral valve: There was mild to moderate regurgitation. - Left atrium: The atrium was mildly dilated. - Right atrium: The atrium was moderately to severely dilated. - Tricuspid valve: There was moderate regurgitation. - Pulmonary arteries: PA peak pressure: 49 mm Hg (S).  Holter 01/10/18 - personally reviewed Normal sinus rhythm Rare episodes of SVT/atrial tachycardia, 130 runs longest run 14 beats with rate 138 bpm Frequent PACs, 7%  Rare PVCs, 1% Rare PVCs and bigemeny  ASSESSMENT AND PLAN:  1.  SVT: Rate control options due to underlying bradycardia and cannot use amiodarone due to COPD.  Flecainide would be inappropriate due to underlying coronary disease.  We Vickye Astorino thus plan for ablation.  Risks and benefits were discussed include bleeding, tamponade, heart block, stroke.  The patient understands risks and is agreed to the procedure.    2.  Hypertension: Mildly elevated today.  Has been normal in the past.  No  changes.  3.  Coronary artery disease status post CABG without angina: No chest pain.  Continue Plavix  4.  Lower extremity edema: I told her that she can take her Lasix up to 4 times per week.  Current medicines are reviewed at length with the patient today.   The patient does not have concerns regarding her medicines.  The following changes were made today:  none  Labs/ tests ordered today include:  Orders Placed This Encounter  Procedures  . CBC  . Basic metabolic panel     Disposition:   FU with Pierre Dellarocco 1.5 months  Signed, Raia Amico Meredith Leeds, MD  03/09/2018 2:54 PM     [facility] [address] [place] [address] [phone] (office) [phone] (fax)

## 2018-03-09 NOTE — Patient Instructions (Addendum)
Medication Instructions:  Your physician recommends that you continue on your current medications as directed. Please refer to the Current Medication list given to you today.  If you need a refill on your cardiac medications before your next appointment, please call your pharmacy.   Lab work: Pre procedure lab work today: BMP & CBC If you have labs (blood work) drawn today and your tests are completely normal, you will receive your results only by: Marland Kitchen MyChart Message (if you have MyChart) OR . A paper copy in the mail If you have any lab test that is abnormal or we need to change your treatment, we will call you to review the results.  Testing/Procedures: Your physician has recommended that you have an ablation. Catheter ablation is a medical procedure used to treat some cardiac arrhythmias (irregular heartbeats). During catheter ablation, a long, thin, flexible tube is put into a blood vessel in your groin (upper thigh), or neck. This tube is called an ablation catheter. It is then guided to your heart through the blood vessel. Radio frequency waves destroy small areas of heart tissue where abnormal heartbeats may cause an arrhythmia to start. Please see the instruction sheet given to you today.  Instructions for your ablation: 1. Please arrive at the [facility], Main Entrance "A", of [facility] at 5:30 a.m. on 03/19/2018. 2. Do not eat or drink after midnight the night prior to the procedure. 3. Do not take any medications the morning of the procedure. 4. Plan for an overnight stay in the hospital. 5. You will need someone to drive you home at discharge.  Follow-Up: At [facility], you and your health needs are our priority.  As part of our continuing mission to provide you with exceptional heart care, we have created designated Provider Care Teams.  These Care Teams include your primary Cardiologist (physician) and Advanced Practice Providers (APPs -  Physician Assistants and  Nurse Practitioners) who all work together to provide you with the care you need, when you need it. You will need a follow up appointment in 6 weeks with Dr. Curt Bears.   Thank you for choosing [facility]!!   Trinidad Curet, RN [phone]   Any Other Special Instructions Will Be Listed Below (If Applicable).  Cardiac Ablation Cardiac ablation is a procedure to disable (ablate) a small amount of heart tissue in very specific places. The heart has many electrical connections. Sometimes these connections are abnormal and can cause the heart to beat very fast or irregularly. Ablating some of the problem areas can improve the heart rhythm or return it to normal. Ablation may be done for people who:  Have Wolff-Parkinson-White syndrome.  Have fast heart rhythms (tachycardia).  Have taken medicines for an abnormal heart rhythm (arrhythmia) that were not effective or caused side effects.  Have a high-risk heartbeat that may be life-threatening.  During the procedure, a small incision is made in the neck or the groin, and a long, thin, flexible tube (catheter) is inserted into the incision and moved to the heart. Small devices (electrodes) on the tip of the catheter will send out electrical currents. A type of X-ray (fluoroscopy) will be used to help guide the catheter and to provide images of the heart. Tell a health care provider about:  Any allergies you have.  All medicines you are taking, including vitamins, herbs, eye drops, creams, and over-the-counter medicines.  Any problems you or family members have had with anesthetic medicines.  Any blood disorders you  have.  Any surgeries you have had.  Any medical conditions you have, such as kidney failure.  Whether you are pregnant or may be pregnant. What are the risks? Generally, this is a safe procedure. However, problems may occur, including:  Infection.  Bruising and bleeding at the catheter insertion site.  Bleeding  into the chest, especially into the sac that surrounds the heart. This is a serious complication.  Stroke or blood clots.  Damage to other structures or organs.  Allergic reaction to medicines or dyes.  Need for a permanent pacemaker if the normal electrical system is damaged. A pacemaker is a small computer that sends electrical signals to the heart and helps your heart beat normally.  The procedure not being fully effective. This may not be recognized until months later. Repeat ablation procedures are sometimes required.  What happens before the procedure?  Follow instructions from your health care provider about eating or drinking restrictions.  Ask your health care provider about: ? Changing or stopping your regular medicines. This is especially important if you are taking diabetes medicines or blood thinners. ? Taking medicines such as aspirin and ibuprofen. These medicines can thin your blood. Do not take these medicines before your procedure if your health care provider instructs you not to.  Plan to have someone take you home from the hospital or clinic.  If you will be going home right after the procedure, plan to have someone with you for 24 hours. What happens during the procedure?  To lower your risk of infection: ? Your health care team will wash or sanitize their hands. ? Your skin will be washed with soap. ? Hair may be removed from the incision area.  An IV tube will be inserted into one of your veins.  You will be given a medicine to help you relax (sedative).  The skin on your neck or groin will be numbed.  An incision will be made in your neck or your groin.  A needle will be inserted through the incision and into a large vein in your neck or groin.  A catheter will be inserted into the needle and moved to your heart.  Dye may be injected through the catheter to help your surgeon see the area of the heart that needs treatment.  Electrical currents will  be sent from the catheter to ablate heart tissue in desired areas. There are three types of energy that may be used to ablate heart tissue: ? Heat (radiofrequency energy). ? Laser energy. ? Extreme cold (cryoablation).  When the necessary tissue has been ablated, the catheter will be removed.  Pressure will be held on the catheter insertion area to prevent excessive bleeding.  A bandage (dressing) will be placed over the catheter insertion area. The procedure may vary among health care providers and hospitals. What happens after the procedure?  Your blood pressure, heart rate, breathing rate, and blood oxygen level will be monitored until the medicines you were given have worn off.  Your catheter insertion area will be monitored for bleeding. You will need to lie still for a few hours to ensure that you do not bleed from the catheter insertion area.  Do not drive for 24 hours or as long as directed by your health care provider. Summary  Cardiac ablation is a procedure to disable (ablate) a small amount of heart tissue in very specific places. Ablating some of the problem areas can improve the heart rhythm or return it to normal.  During the procedure, electrical currents will be sent from the catheter to ablate heart tissue in desired areas. This information is not intended to replace advice given to you by your health care provider. Make sure you discuss any questions you have with your health care provider. Document Released: 09/28/2008 Document Revised: 03/31/2016 Document Reviewed: 03/31/2016 Elsevier Interactive Patient Education  [employer].

## 2018-03-09 NOTE — Addendum Note (Signed)
Addended by: Marciano Sequin on: 03/09/2018 03:19 PM   Modules accepted: Orders

## 2018-03-10 LAB — CBC
HEMATOCRIT: 38.1 % (ref 34.0–46.6)
HEMOGLOBIN: 12.5 g/dL (ref 11.1–15.9)
MCH: 26.8 pg (ref 26.6–33.0)
MCHC: 32.8 g/dL (ref 31.5–35.7)
MCV: 82 fL (ref 79–97)
Platelets: 182 10*3/uL (ref 150–450)
RBC: 4.67 x10E6/uL (ref 3.77–5.28)
RDW: 14.8 % (ref 12.3–15.4)
WBC: 6.1 10*3/uL (ref 3.4–10.8)

## 2018-03-10 LAB — BASIC METABOLIC PANEL
BUN/Creatinine Ratio: 17 (ref 12–28)
BUN: 15 mg/dL (ref 8–27)
CALCIUM: 9 mg/dL (ref 8.7–10.3)
CO2: 25 mmol/L (ref 20–29)
CREATININE: 0.88 mg/dL (ref 0.57–1.00)
Chloride: 105 mmol/L (ref 96–106)
GFR calc Af Amer: 73 mL/min/{1.73_m2} (ref >59)
GFR, EST NON AFRICAN AMERICAN: 63 mL/min/{1.73_m2} (ref >59)
GLUCOSE: 97 mg/dL (ref 65–99)
POTASSIUM: 3.7 mmol/L (ref 3.5–5.2)
Sodium: 145 mmol/L — ABNORMAL HIGH (ref 134–144)

## 2018-03-11 ENCOUNTER — Telehealth: Payer: Self-pay | Admitting: Cardiology

## 2018-03-11 NOTE — Telephone Encounter (Signed)
Pt dropped off FMLA paperwork at the front desk. I called her and left a vm with her asking her to return my call, we need to get a signed release/pmt so we can go ahead with the process of this paperwork.

## 2018-03-18 NOTE — Telephone Encounter (Signed)
Updated pt with another time change for her procedure. Pt aware to arrive at 9:00 am.tomorrow. NPO after MN. Pt agreeable to plan.

## 2018-03-19 ENCOUNTER — Encounter (HOSPITAL_COMMUNITY)
Admission: RE | Disposition: A | Discharge status: Home / Self Care | Payer: Self-pay | Source: Ambulatory Visit | Attending: Cardiology

## 2018-03-19 ENCOUNTER — Ambulatory Visit (HOSPITAL_COMMUNITY)
Admission: RE | Admit: 2018-03-19 | Discharge: 2018-03-19 | Disposition: A | Discharge status: Home / Self Care | Payer: PPO | Source: Ambulatory Visit | Attending: Cardiology | Admitting: Cardiology

## 2018-03-19 DIAGNOSIS — E039 Hypothyroidism, unspecified: Secondary | ICD-10-CM | POA: Insufficient documentation

## 2018-03-19 DIAGNOSIS — Z881 Allergy status to other antibiotic agents status: Secondary | ICD-10-CM | POA: Insufficient documentation

## 2018-03-19 DIAGNOSIS — Z8701 Personal history of pneumonia (recurrent): Secondary | ICD-10-CM | POA: Insufficient documentation

## 2018-03-19 DIAGNOSIS — Z7902 Long term (current) use of antithrombotics/antiplatelets: Secondary | ICD-10-CM | POA: Insufficient documentation

## 2018-03-19 DIAGNOSIS — Z87891 Personal history of nicotine dependence: Secondary | ICD-10-CM | POA: Insufficient documentation

## 2018-03-19 DIAGNOSIS — M810 Age-related osteoporosis without current pathological fracture: Secondary | ICD-10-CM | POA: Insufficient documentation

## 2018-03-19 DIAGNOSIS — Z888 Allergy status to other drugs, medicaments and biological substances status: Secondary | ICD-10-CM | POA: Insufficient documentation

## 2018-03-19 DIAGNOSIS — Z8249 Family history of ischemic heart disease and other diseases of the circulatory system: Secondary | ICD-10-CM | POA: Insufficient documentation

## 2018-03-19 DIAGNOSIS — Z79899 Other long term (current) drug therapy: Secondary | ICD-10-CM | POA: Diagnosis not present

## 2018-03-19 DIAGNOSIS — Z85828 Personal history of other malignant neoplasm of skin: Secondary | ICD-10-CM | POA: Insufficient documentation

## 2018-03-19 DIAGNOSIS — Z91013 Allergy to seafood: Secondary | ICD-10-CM | POA: Insufficient documentation

## 2018-03-19 DIAGNOSIS — I471 Supraventricular tachycardia: Secondary | ICD-10-CM | POA: Insufficient documentation

## 2018-03-19 DIAGNOSIS — Z800 Family history of malignant neoplasm of digestive organs: Secondary | ICD-10-CM | POA: Insufficient documentation

## 2018-03-19 DIAGNOSIS — Z885 Allergy status to narcotic agent status: Secondary | ICD-10-CM | POA: Diagnosis not present

## 2018-03-19 DIAGNOSIS — F329 Major depressive disorder, single episode, unspecified: Secondary | ICD-10-CM | POA: Diagnosis not present

## 2018-03-19 DIAGNOSIS — J449 Chronic obstructive pulmonary disease, unspecified: Secondary | ICD-10-CM | POA: Diagnosis not present

## 2018-03-19 DIAGNOSIS — I714 Abdominal aortic aneurysm, without rupture: Secondary | ICD-10-CM | POA: Insufficient documentation

## 2018-03-19 DIAGNOSIS — Z9841 Cataract extraction status, right eye: Secondary | ICD-10-CM | POA: Insufficient documentation

## 2018-03-19 DIAGNOSIS — Z7951 Long term (current) use of inhaled steroids: Secondary | ICD-10-CM | POA: Insufficient documentation

## 2018-03-19 DIAGNOSIS — Z801 Family history of malignant neoplasm of trachea, bronchus and lung: Secondary | ICD-10-CM | POA: Insufficient documentation

## 2018-03-19 DIAGNOSIS — Z823 Family history of stroke: Secondary | ICD-10-CM | POA: Insufficient documentation

## 2018-03-19 DIAGNOSIS — Z95820 Peripheral vascular angioplasty status with implants and grafts: Secondary | ICD-10-CM | POA: Insufficient documentation

## 2018-03-19 DIAGNOSIS — Z880 Allergy status to penicillin: Secondary | ICD-10-CM | POA: Insufficient documentation

## 2018-03-19 DIAGNOSIS — Z955 Presence of coronary angioplasty implant and graft: Secondary | ICD-10-CM | POA: Insufficient documentation

## 2018-03-19 DIAGNOSIS — I10 Essential (primary) hypertension: Secondary | ICD-10-CM | POA: Insufficient documentation

## 2018-03-19 DIAGNOSIS — Z951 Presence of aortocoronary bypass graft: Secondary | ICD-10-CM | POA: Insufficient documentation

## 2018-03-19 DIAGNOSIS — E785 Hyperlipidemia, unspecified: Secondary | ICD-10-CM | POA: Insufficient documentation

## 2018-03-19 DIAGNOSIS — Z86711 Personal history of pulmonary embolism: Secondary | ICD-10-CM | POA: Diagnosis not present

## 2018-03-19 DIAGNOSIS — Z9842 Cataract extraction status, left eye: Secondary | ICD-10-CM | POA: Insufficient documentation

## 2018-03-19 DIAGNOSIS — Z9889 Other specified postprocedural states: Secondary | ICD-10-CM | POA: Insufficient documentation

## 2018-03-19 DIAGNOSIS — Z9851 Tubal ligation status: Secondary | ICD-10-CM | POA: Insufficient documentation

## 2018-03-19 DIAGNOSIS — K219 Gastro-esophageal reflux disease without esophagitis: Secondary | ICD-10-CM | POA: Diagnosis not present

## 2018-03-19 HISTORY — PX: SVT ABLATION: EP1225

## 2018-03-19 SURGERY — SVT ABLATION

## 2018-03-19 MED ORDER — FENTANYL CITRATE (PF) 100 MCG/2ML IJ SOLN
INTRAMUSCULAR | Status: AC
  Filled 2018-03-19: qty 2

## 2018-03-19 MED ORDER — SODIUM CHLORIDE 0.9 % IV SOLN: 250.0000 mL | INTRAVENOUS | Status: DC | PRN

## 2018-03-19 MED ORDER — FENTANYL CITRATE (PF) 100 MCG/2ML IJ SOLN
INTRAMUSCULAR | Status: DC | PRN
  Administered 2018-03-19 (×2): 25 ug via INTRAVENOUS

## 2018-03-19 MED ORDER — ISOPROTERENOL HCL 0.2 MG/ML IJ SOLN
INTRAMUSCULAR | Status: AC
  Filled 2018-03-19: qty 5

## 2018-03-19 MED ORDER — HEPARIN (PORCINE) IN NACL 1000-0.9 UT/500ML-% IV SOLN
INTRAVENOUS | Status: DC | PRN
  Administered 2018-03-19 (×2): 500 mL

## 2018-03-19 MED ORDER — SODIUM CHLORIDE 0.9% FLUSH: 3.0000 mL | Freq: Two times a day (BID) | INTRAVENOUS | Status: DC

## 2018-03-19 MED ORDER — ACETAMINOPHEN 325 MG PO TABS
ORAL_TABLET | ORAL | Status: AC
  Filled 2018-03-19: qty 2

## 2018-03-19 MED ORDER — MIDAZOLAM HCL 5 MG/5ML IJ SOLN
INTRAMUSCULAR | Status: AC
  Filled 2018-03-19: qty 5

## 2018-03-19 MED ORDER — ACETAMINOPHEN 325 MG PO TABS
650.0000 mg | ORAL_TABLET | ORAL | Status: DC | PRN
  Administered 2018-03-19: 650 mg via ORAL
  Filled 2018-03-19 (×2): qty 2

## 2018-03-19 MED ORDER — BUPIVACAINE HCL (PF) 0.25 % IJ SOLN
INTRAMUSCULAR | Status: AC
  Filled 2018-03-19: qty 60

## 2018-03-19 MED ORDER — HEPARIN (PORCINE) IN NACL 1000-0.9 UT/500ML-% IV SOLN
INTRAVENOUS | Status: AC
  Filled 2018-03-19: qty 500

## 2018-03-19 MED ORDER — ONDANSETRON HCL 4 MG/2ML IJ SOLN: 4.0000 mg | Freq: Four times a day (QID) | INTRAMUSCULAR | Status: DC | PRN

## 2018-03-19 MED ORDER — SODIUM CHLORIDE 0.9% FLUSH: 3.0000 mL | INTRAVENOUS | Status: DC | PRN

## 2018-03-19 MED ORDER — SODIUM CHLORIDE 0.9 % IV SOLN
INTRAVENOUS | Status: DC
  Administered 2018-03-19: 10:00:00 via INTRAVENOUS

## 2018-03-19 MED ORDER — MIDAZOLAM HCL 5 MG/5ML IJ SOLN
INTRAMUSCULAR | Status: DC | PRN
  Administered 2018-03-19 (×2): 1 mg via INTRAVENOUS

## 2018-03-19 MED ORDER — BUPIVACAINE HCL (PF) 0.25 % IJ SOLN
INTRAMUSCULAR | Status: DC | PRN
  Administered 2018-03-19: 45 mL

## 2018-03-19 MED ORDER — ISOPROTERENOL HCL 0.2 MG/ML IJ SOLN
INTRAVENOUS | Status: DC | PRN
  Administered 2018-03-19: 2 ug/min via INTRAVENOUS

## 2018-03-19 SURGICAL SUPPLY — 11 items
BAG SNAP BAND KOVER 36X36 (MISCELLANEOUS) ×2 IMPLANT
CATH DUODECA HALO/ISMUS 7FR (CATHETERS) ×2 IMPLANT
CATH JOSEPH QUAD ALLRED 6F REP (CATHETERS) ×4 IMPLANT
CATH WEBSTER BI DIR CS D-F CRV (CATHETERS) ×2 IMPLANT
PACK EP LATEX FREE (CUSTOM PROCEDURE TRAY) ×1
PACK EP LF (CUSTOM PROCEDURE TRAY) ×1 IMPLANT
PAD PRO RADIOLUCENT 2001M-C (PAD) ×2 IMPLANT
PATCH CARTO3 (PAD) ×2 IMPLANT
SHEATH PINNACLE 6F 10CM (SHEATH) ×4 IMPLANT
SHEATH PINNACLE 7F 10CM (SHEATH) ×2 IMPLANT
SHEATH PINNACLE 8F 10CM (SHEATH) ×2 IMPLANT

## 2018-03-19 NOTE — Progress Notes (Signed)
Up and walked and tolerated well; bilat groins stable no bleeding or hematoma

## 2018-03-19 NOTE — H&P (Signed)
Andrea [name] has presented today for surgery, with the diagnosis of SVT.  The various methods of treatment have been discussed with the patient and family. After consideration of risks, benefits and other options for treatment, the patient has consented to  Procedure(s): Catheter ablation as a surgical intervention .  Risks include but not limited to bleeding, tamponade, heart block, stroke, damage to surrounding organs, among others. The patient's history has been reviewed, patient examined, no change in status, stable for surgery.  I have reviewed the patient's chart and labs.  Questions were answered to the patient's satisfaction.    Will Curt Bears, MD 03/19/2018 12:10 PM

## 2018-03-19 NOTE — Progress Notes (Signed)
No telemetry needed per Dr Curt Bears

## 2018-03-19 NOTE — Discharge Instructions (Signed)
Moderate Conscious Sedation, Adult, Care After These instructions provide you with information about caring for yourself after your procedure. Your health care provider may also give you more specific instructions. Your treatment has been planned according to current medical practices, but problems sometimes occur. Call your health care provider if you have any problems or questions after your procedure. What can I expect after the procedure? After your procedure, it is common:  To feel sleepy for several hours.  To feel clumsy and have poor balance for several hours.  To have poor judgment for several hours.  To vomit if you eat too soon.  Follow these instructions at home: For at least 24 hours after the procedure:   Do not: ? Participate in activities where you could fall or become injured. ? Drive. ? Use heavy machinery. ? Drink alcohol. ? Take sleeping pills or medicines that cause drowsiness. ? Make important decisions or sign legal documents. ? Take care of children on your own.  Rest. Eating and drinking  Follow the diet recommended by your health care provider.  If you vomit: ? Drink water, juice, or soup when you can drink without vomiting. ? Make sure you have little or no nausea before eating solid foods. General instructions  Have a responsible adult stay with you until you are awake and alert.  Take over-the-counter and prescription medicines only as told by your health care provider.  If you smoke, do not smoke without supervision.  Keep all follow-up visits as told by your health care provider. This is important. Contact a health care provider if:  You keep feeling nauseous or you keep vomiting.  You feel light-headed.  You develop a rash.  You have a fever. Get help right away if:  You have trouble breathing. This information is not intended to replace advice given to you by your health care provider. Make sure you discuss any questions you have  with your health care provider. Document Released: 03/02/2013 Document Revised: 10/15/2015 Document Reviewed: 09/01/2015 Elsevier Interactive Patient Education  2018 [place].     Femoral Site Care Refer to this sheet in the next few weeks. These instructions provide you with information about caring for yourself after your procedure. Your health care provider may also give you more specific instructions. Your treatment has been planned according to current medical practices, but problems sometimes occur. Call your health care provider if you have any problems or questions after your procedure. What can I expect after the procedure? After your procedure, it is typical to have the following:  Bruising at the site that usually fades within 1-2 weeks.  Blood collecting in the tissue (hematoma) that may be painful to the touch. It should usually decrease in size and tenderness within 1-2 weeks.  Follow these instructions at home:  Take medicines only as directed by your health care provider.  You may shower 24-48 hours after the procedure or as directed by your health care provider. Remove the bandage (dressing) and gently wash the site with plain soap and water. Pat the area dry with a clean towel. Do not rub the site, because this may cause bleeding.  Do not take baths, swim, or use a hot tub until your health care provider approves.  Check your insertion site every day for redness, swelling, or drainage.  Do not apply powder or lotion to the site.  Limit use of stairs to twice a day for the first 2-3 days or as directed by your health  care provider.  Do not squat for the first 2-3 days or as directed by your health care provider.  Do not lift over 10 lb (4.5 kg) for 5 days after your procedure or as directed by your health care provider.  Ask your health care provider when it is okay to: ? Return to work or school. ? Resume usual physical activities or sports. ? Resume sexual  activity.  Do not drive home if you are discharged the same day as the procedure. Have someone else drive you.  You may drive 24 hours after the procedure unless otherwise instructed by your health care provider.  Do not operate machinery or power tools for 24 hours after the procedure or as directed by your health care provider.  If your procedure was done as an outpatient procedure, which means that you went home the same day as your procedure, a responsible adult should be with you for the first 24 hours after you arrive home.  Keep all follow-up visits as directed by your health care provider. This is important. Contact a health care provider if:  You have a fever.  You have chills.  You have increased bleeding from the site. Hold pressure on the site. Get help right away if:  You have unusual pain at the site.  You have redness, warmth, or swelling at the site.  You have drainage (other than a small amount of blood on the dressing) from the site.  The site is bleeding, and the bleeding does not stop after 30 minutes of holding steady pressure on the site.  Your leg or foot becomes pale, cool, tingly, or numb. This information is not intended to replace advice given to you by your health care provider. Make sure you discuss any questions you have with your health care provider. Document Released: 01/13/2014 Document Revised: 10/18/2015 Document Reviewed: 11/29/2013 Elsevier Interactive Patient Education  2018 [place] procedure care instructions No driving for 4 days. No lifting over 5 lbs for 1 week. No vigorous or sexual activity for 1 week. You may return to work on 03/26/18. Keep procedure site clean & dry. If you notice increased pain, swelling, bleeding or pus, call/return!  You may shower, but no soaking baths/hot tubs/pools for 1 week.

## 2018-03-22 ENCOUNTER — Encounter (HOSPITAL_COMMUNITY): Payer: Self-pay | Admitting: Cardiology

## 2018-03-24 ENCOUNTER — Telehealth: Payer: Self-pay | Admitting: Cardiology

## 2018-03-24 NOTE — Telephone Encounter (Signed)
New message   Patient is returning the call to talk a form for going back to work that needs to be filled out. Please advise.

## 2018-03-24 NOTE — Telephone Encounter (Signed)
Reviewed FMLA paperwork work days/hours information requested. Informed that paperwork would be signed by Dr. Curt Bears next week. She verbalized understanding.

## 2018-03-29 DIAGNOSIS — D0462 Carcinoma in situ of skin of left upper limb, including shoulder: Secondary | ICD-10-CM | POA: Diagnosis not present

## 2018-03-29 DIAGNOSIS — C44629 Squamous cell carcinoma of skin of left upper limb, including shoulder: Secondary | ICD-10-CM | POA: Diagnosis not present

## 2018-03-29 DIAGNOSIS — I270 Primary pulmonary hypertension: Secondary | ICD-10-CM | POA: Diagnosis not present

## 2018-03-29 DIAGNOSIS — J449 Chronic obstructive pulmonary disease, unspecified: Secondary | ICD-10-CM | POA: Diagnosis not present

## 2018-03-30 ENCOUNTER — Other Ambulatory Visit: Payer: Self-pay | Admitting: Cardiovascular Disease

## 2018-03-30 MED ORDER — CLOPIDOGREL BISULFATE 75 MG PO TABS: 75.0000 mg | ORAL_TABLET | Freq: Every day | ORAL | 0 refills | Status: DC

## 2018-03-30 NOTE — Telephone Encounter (Signed)
°*  STAT* If patient is at the pharmacy, call can be transferred to refill team.   1. Which medications need to be refilled? (please list name of each medication and dose if known) Clopidogrel   2. Which pharmacy/location (including street and city if local pharmacy) is medication to be sent to? [place]   3. Do they need a 30 day or 90 day supply? 90 day

## 2018-04-06 NOTE — Progress Notes (Signed)
Cardiology Office Note  Date:  04/07/2018   ID:  Andrea [name], DOB 12/10/1940, MRN [id number]  PCP:  Mosie Lukes, MD   Chief Complaint  Patient presents with  . other    12 month f/u c/o rapid heart beat, exhausted and sob. Meds reviewed verbally with pt.    HPI:  Andrea [name] is a pleasant 77 year-old woman with history of  chronic bradycardia,  spinal stenosis,  peripheral vascular disease,  stents to her lower extremities status post CEA on the left, 40-59% residual disease on the right, 10/17 hyperlipidemia,  COPD, long smoking history for at least 30 years coronary artery disease and bypass surgery in 1999, at [facility] in [place] stent placement to distal RCA may 2013, In August 2017 she had 2 stents placed to vein graft to the RCA, also with stents to the mid left circumflex with resolution of her anginal symptoms cardiac catheterization 06/25/2016 occluded vein graft to the RCA, 60% left ostial subclavian vessel disease Shepresents for routine followup Of her coronary artery disease .  She is part of the bio freedom study  Reports having problems with SVT, tachycardia Failed SVT ablation October 2019 Could be not induced  Still having tachycardia, such as last night at dinner She is taking metoprolol tartrate once a day, gets lazy, could take it twice a day but she forgets  Compliant with her Crestor,  denies any shortness of breath or chest pain concerning for angina Some congestion, took her inhalers this morning Does not check her blood pressure at home, denies orthostasis Initial blood pressure in our office 735 systolic 329 systolic on my recheck left arm sitting  EKG personally reviewed by myself on todays visit Shows normal sinus rhythm rate 62 bpm T wave inversion precordial leads, inferior leads   Other past medical history reviewed cardiac catheterization 06/25/2016 occluded vein graft to the RCA, 60% left ostial subclavian vessel  disease Medical management   Echo 08/11/2016 Normal EF, moderately elevated RVSP 57 mm Hg, moderate to severe TR  Carotid 10/17 40 to 59% disease on the right, <39% on the left  Denies any significant anginal symptoms previously stopped her Crestor, cholesterol from 144 up to 242 in May 30. 2017 Now back on crestor LDL 64, total chol 126  Cardiac catheterization results 1. Significant underlying three-vessel coronary artery disease with patent LIMA to LAD. The SVG to the RCA is now occluded proximally with left-to-right collaterals. The left circumflex stent is patent with no significant restenosis. 2. Mildly reduced LV systolic function with an EF of 50-55% and inferior wall hypokinesis. Mildly elevated left ventricular end-diastolic pressure. 3. Aortic arch angiogram was done to evaluate the stenosis in the left subclavian artery. It showed 60% heavily calcified stenosis in the ostium of the left subclavian artery. There was about 92-42 mm systolic gradient across the stenosis.   cardiac catheterization 01/02/2016 She had 2 stents placed to the vein graft to the RCA, also angioplasty with stent placement to the mid left circumflex Following intervention she feels much better, no chest pain, shortness of breath improved Previously was taking nitroglycerin for anginal symptoms, now no longer needing nitroglycerin Was taking NTG 5 x in one day  She has a history of stents to her lower extremities, these have not been visualized in many years  Cath 01/02/16 Significant underlying three-vessel coronary artery disease. Patent LIMA to LAD. However, there is retrograde flow in the graft itself likely due to no obstructive disease down the  native LAD. The native LAD has diffuse 60-70% calcified disease in the proximal and midsegment. Severe new disease in SVG to RCA. Collaterals were noted from left-to-right. Worsening disease in the native left circumflex which does not have a patent  bypass.  2. Moderate left subclavian stenosis which was not significant by gradients. 3. Normal left ventricular end-diastolic pressure. Left ventricular angiography was not performed. 4. Successful angioplasty and drug-eluting stent placement to the SVG to RCA with 2 bio freedom stent placement. Successful angioplasty and stent placement to the mid left circumflex with 1 I freedom stent.  The SVG to RCA is an old graft with new areas of disease. I think this graft is high risk for closure in spite of intervention today. The patient was enrolled in the bio freedom study and thus dual antiplatelet therapy is optional after one month. However, I recommend continuing Plavix indefinitely. The patient did have significant chest pain during SVG to RCA PCI with ST changes. By the end of the procedure, her chest pain resolved completely. However, mild ST elevation persisted possibly due to microvascular injury.   catheterization November 2012 Atretic LIMA to the LAD,  She has saphenous vein graft sequential to PDA and PL loss of distal limb of vein graft to the PDA and PL with left-to-right collaterals 40-50% proximal to mid LAD with focal 60-70% mid LAD 2 large OM's, 30% mid OM1, 40-50% ostial Ejection fraction 55% with inferobasal akinesis   PMH:   has a past medical history of AAA (abdominal aortic aneurysm) ([place]) (01/2009), Acute bronchitis (03/20/2013; 2017), Angina, Anosmia, Asthma, Baker's cyst of knee (01/30/2014), Basal cell carcinoma, Bradycardia, CAD (coronary artery disease), Cerebrovascular disease (01/2009), Chronic thoracic back pain, COPD (chronic obstructive pulmonary disease) ([place]), Depression, Dizziness, Dysrhythmia, GERD (gastroesophageal reflux disease), Heart murmur, Herniated lumbar disc without myelopathy, History of blood transfusion (1999), Hyperglycemia (10/23/2015), Hyperlipidemia, Hypertension, Hypothyroidism (09/30/2016), Medicare annual wellness visit, subsequent (07/31/2013),  NSTEMI (non-ST elevated myocardial infarction) ([place]) (11/12), Osteoarthritis of back, Osteoporosis, Pneumonia ("several times"), Pulmonary embolism ([place]) (1999), PVD (peripheral vascular disease) ([place]), Shortness of breath, Squamous carcinoma, Thyroid disease, and Unstable angina ([place]) (09/25/2015).  PSH:    Past Surgical History:  Procedure Laterality Date  . ABDOMINAL AORTIC ANEURYSM REPAIR     pt denies this hx on 01/02/2016  . BASAL CELL CARCINOMA EXCISION    . CARDIAC CATHETERIZATION N/A 01/02/2016   Procedure: Left Heart Cath and Coronary Angiography;  Surgeon: Wellington Hampshire, MD;  Location: [place];  Service: Cardiovascular;  Laterality: N/A;  . CARDIAC CATHETERIZATION  1996; 1999  . CARDIAC CATHETERIZATION N/A 06/25/2016   Procedure: Left Heart Cath and Cors/Grafts Angiography;  Surgeon: Wellington Hampshire, MD;  Location: [place];  Service: Cardiovascular;  Laterality: N/A;  . CAROTID ENDARTERECTOMY Left 01/02/2011  . CATARACT EXTRACTION W/ INTRAOCULAR LENS  IMPLANT, BILATERAL    . CLOSED REDUCTION NASAL FRACTURE  11/2007  . CORONARY ANGIOPLASTY WITH STENT PLACEMENT  10/10/2011   drug eluting  to rc & saphenous  . CORONARY ARTERY BYPASS GRAFT  1999   "CABG X3"  . DILATION AND CURETTAGE OF UTERUS    . FEMORAL ARTERY STENT Bilateral   . FRACTURE SURGERY    . LEFT HEART CATHETERIZATION WITH CORONARY/GRAFT ANGIOGRAM N/A 04/22/2011   Procedure: LEFT HEART CATHETERIZATION WITH Beatrix Fetters;  Surgeon: Jolaine Artist, MD;  Location: [facility];  Service: Cardiovascular;  Laterality: N/A;  . LEFT HEART CATHETERIZATION WITH CORONARY/GRAFT ANGIOGRAM N/A 10/10/2011  Procedure: LEFT HEART CATHETERIZATION WITH Beatrix Fetters;  Surgeon: Peter M Martinique, MD;  Location: [facility] CATH LAB;  Service: Cardiovascular;  Laterality: N/A;  . NASAL SINUS SURGERY     twice  . SQUAMOUS CELL CARCINOMA EXCISION    . SVT ABLATION N/A 03/19/2018   Procedure: SVT ABLATION;   Surgeon: Constance Haw, MD;  Location: [facility];  Service: Cardiovascular;  Laterality: N/A;  . TUBAL LIGATION      Current Outpatient Medications  Medication Sig Dispense Refill  . acetaminophen (TYLENOL) 650 MG CR tablet Take 1,300 mg by mouth 2 (two) times daily.    Marland Kitchen albuterol (PROVENTIL HFA;VENTOLIN HFA) 108 (90 Base) MCG/ACT inhaler INHALE 2 PUFFS INTO THE LUNGS EVERY 6 (SIX) HOURS AS NEEDED FOR WHEEZING. (Patient taking differently: Inhale 2 puffs into the lungs every 6 (six) hours as needed for wheezing or shortness of breath. ) 8.5 Inhaler 1  . amLODipine (NORVASC) 10 MG tablet Take 1 tablet (10 mg total) by mouth daily. 180 tablet 3  . Biotin 5 MG CAPS Take 5 mg by mouth daily.    . Calcium Carbonate-Vitamin D (CALCIUM + D PO) Take 1 tablet by mouth 2 (two) times daily.    . Cholecalciferol (VITAMIN D3) 2000 units TABS Take 2,000 Units by mouth 2 (two) times daily.    . clopidogrel (PLAVIX) 75 MG tablet Take 1 tablet (75 mg total) by mouth daily. 30 tablet 0  . famotidine (PEPCID) 40 MG tablet Take 1 tablet (40 mg total) by mouth at bedtime. 30 tablet 3  . fluticasone (FLONASE) 50 MCG/ACT nasal spray Place 2 sprays into both nostrils daily. For congestion (Patient taking differently: Place 2 sprays into both nostrils daily as needed for allergies. ) 48 g 3  . furosemide (LASIX) 20 MG tablet Take 20 mg by mouth every Monday, Wednesday, and Friday.     Marland Kitchen HYDROcodone-acetaminophen (NORCO) 10-325 MG tablet Take 1 tablet by mouth every 6 (six) hours as needed for severe pain.    Marland Kitchen HYDROcodone-homatropine (HYCODAN) 5-1.5 MG/5ML syrup Take 5 mLs by mouth every 8 (eight) hours as needed for cough. 120 mL 0  . isosorbide mononitrate (IMDUR) 60 MG 24 hr tablet Take 1 tablet (60 mg total) by mouth 2 (two) times daily. 180 tablet 3  . Krill Oil 1000 MG CAPS Take 1,000 mg by mouth daily.    Marland Kitchen levothyroxine (SYNTHROID, LEVOTHROID) 25 MCG tablet TAKE 1 TABLET (25 MCG TOTAL) BY MOUTH  DAILY BEFORE BREAKFAST. 90 tablet 3  . Melatonin 10 MG CAPS Take 10 mg by mouth at bedtime.    . metoprolol tartrate (LOPRESSOR) 25 MG tablet Take 25 mg by mouth daily.     . nitroGLYCERIN (NITROSTAT) 0.4 MG SL tablet PLACE 1 TABLET (0.4 MG TOTAL) UNDER THE TONGUE EVERY 5 (FIVE) MINUTES AS NEEDED FOR CHEST PAIN. 25 tablet 0  . omeprazole (PRILOSEC) 20 MG capsule Take 1 capsule (20 mg total) by mouth daily. 30 capsule 3  . Polyethyl Glycol-Propyl Glycol (SYSTANE OP) Place 1 drop into both eyes 3 (three) times daily as needed (for dry eyes).     . potassium chloride (K-DUR) 10 MEQ tablet Take 1 tablet (10 mEq total) by mouth daily as needed. (Patient taking differently: Take 10 mEq by mouth daily as needed (for cramping/lab results.). ) 90 tablet 3  . rosuvastatin (CRESTOR) 40 MG tablet TAKE 1 TABLET (40 MG TOTAL) BY MOUTH DAILY. 90 tablet 3  . sertraline (ZOLOFT) 50 MG  tablet TAKE 1 TABLET BY MOUTH EVERY DAY (Patient taking differently: Take 50 mg by mouth daily. ) 90 tablet 1  . umeclidinium-vilanterol (ANORO ELLIPTA) 62.5-25 MCG/INH AEPB Inhale 1 puff into the lungs daily. 60 each 5  . Vitamin D, Ergocalciferol, (DRISDOL) 50000 units CAPS capsule TAKE 1 CAPSULE (50,000 UNITS TOTAL) BY MOUTH EVERY 7 (SEVEN) DAYS. (Patient taking differently: Take 50,000 Units by mouth every Monday. ) 12 capsule 1  . zinc gluconate 50 MG tablet Take 50 mg by mouth daily.     No current facility-administered medications for this visit.      Allergies:   Codeine; Erythromycin; Meperidine; Shellfish allergy; Ciprofloxacin; Penicillins; and Tape   Social History:  The patient  reports that she quit smoking about 25 years ago. Her smoking use included cigarettes. She has a 20.00 pack-year smoking history. She has never used smokeless tobacco. She reports that she drinks alcohol. She reports that she does not use drugs.   Family History:   family history includes Alcohol abuse in her brother; Aneurysm in her brother;  Cirrhosis in her maternal grandfather; Colon cancer (age of onset: 17) in her father; Diabetes in her maternal grandmother and mother; Heart attack in her brother and brother; Heart attack (age of onset: 38) in her mother; Heart disease in her brother, brother, and father; Hyperlipidemia in her daughter; Hypertension in her brother and father; Hypothyroidism in her sister; Lung cancer in her father and sister; Obesity in her daughter and son; Stroke in her maternal grandmother and paternal grandmother.    Review of Systems: Review of Systems  Constitutional: Negative.   HENT: Negative.   Respiratory: Positive for shortness of breath.   Cardiovascular: Negative.   Gastrointestinal: Negative.   Musculoskeletal: Negative.   Neurological: Negative.   Psychiatric/Behavioral: Negative.   All other systems reviewed and are negative.    PHYSICAL EXAM: VS:  BP 100/80 (BP Location: Left Arm, Patient Position: Sitting, Cuff Size: Normal)   Pulse 62   Ht 5\' 2"  (1.575 m)   Wt 144 lb 12 oz (65.7 kg)   BMI 26.48 kg/m  , BMI Body mass index is 26.48 kg/m. Constitutional:  oriented to person, place, and time. No distress.  HENT:  Head: Grossly normal Eyes:  no discharge. No scleral icterus.  Neck: No JVD, no carotid bruits  Cardiovascular: Regular rate and rhythm, no murmurs appreciated Pulmonary/Chest: Clear to auscultation bilaterally, no wheezes or rails Abdominal: Soft.  no distension.  no tenderness.  Musculoskeletal: Normal range of motion Neurological:  normal muscle tone. Coordination normal. No atrophy Skin: Skin warm and dry Psychiatric: normal affect, pleasant  Recent Labs: 12/01/2017: ALT 11; TSH 2.77 03/09/2018: BUN 15; Creatinine, Ser 0.88; Hemoglobin 12.5; Platelets 182; Potassium 3.7; Sodium 145    Lipid Panel Lab Results  Component Value Date   CHOL 148 12/01/2017   HDL 36.50 (L) 12/01/2017   LDLCALC 78 12/01/2017   TRIG 166.0 (H) 12/01/2017      Wt Readings from  Last 3 Encounters:  04/07/18 144 lb 12 oz (65.7 kg)  03/19/18 142 lb (64.4 kg)  03/09/18 146 lb (66.2 kg)     ASSESSMENT AND PLAN:  Mixed hyperlipidemia Recommended she add Zetia to her Crestor to achieve goal LDL preferably less than 60  Essential hypertension Blood pressure running low recommended she decrease amlodipine down to 5 mg daily increase metoprolol titrate up to 25 twice daily She was only taking metoprolol once a day  SVT Will defer to EP  in [place] she increase metoprolol up to 25 twice daily, currently only taking this in the morning and having breakthrough tachycardia dinnertime overnight early morning  Coronary artery disease involving coronary bypass graft of native heart with angina pectoris (HCC)  no anginal symptom  No further testing at this time  Bilateral carotid artery stenosis Endarterectomy on the left, 40-50% disease on the right We will add Zetia  Coronary artery disease of native artery of native heart with stable angina pectoris ([place]) As above no anginal symptoms, no testing ordered  Pulmonary HTN underlying lung disease, 30 years of smoking She is taking Lasix several days per week No significant leg swelling, no change in her breathing Recent echo with mild to moderate pulmonary hypertension  Non-rheumatic tricuspid valve insufficiency Moderate TR on echo   Disposition:   F/U  12 months   Total encounter time more than 25 minutes  Greater than 50% was spent in counseling and coordination of care with the patient    No orders of the defined types were placed in this encounter.    Signed, Esmond Plants, M.D., Ph.D. 04/07/2018  [place], [place]

## 2018-04-07 ENCOUNTER — Encounter: Payer: Self-pay | Admitting: Cardiovascular Disease

## 2018-04-07 ENCOUNTER — Ambulatory Visit (INDEPENDENT_AMBULATORY_CARE_PROVIDER_SITE_OTHER): Payer: PPO | Admitting: Cardiovascular Disease

## 2018-04-07 VITALS — BP 112/80 | HR 62 | Ht 62.0 in | Wt 144.8 lb

## 2018-04-07 DIAGNOSIS — I471 Supraventricular tachycardia, unspecified: Secondary | ICD-10-CM

## 2018-04-07 DIAGNOSIS — E782 Mixed hyperlipidemia: Secondary | ICD-10-CM | POA: Diagnosis not present

## 2018-04-07 DIAGNOSIS — R002 Palpitations: Secondary | ICD-10-CM

## 2018-04-07 DIAGNOSIS — I25118 Atherosclerotic heart disease of native coronary artery with other forms of angina pectoris: Secondary | ICD-10-CM | POA: Diagnosis not present

## 2018-04-07 DIAGNOSIS — I6523 Occlusion and stenosis of bilateral carotid arteries: Secondary | ICD-10-CM

## 2018-04-07 DIAGNOSIS — Z951 Presence of aortocoronary bypass graft: Secondary | ICD-10-CM | POA: Diagnosis not present

## 2018-04-07 DIAGNOSIS — I10 Essential (primary) hypertension: Secondary | ICD-10-CM

## 2018-04-07 MED ORDER — AMLODIPINE BESYLATE 10 MG PO TABS: 5.0000 mg | ORAL_TABLET | Freq: Every day | ORAL | 3 refills | Status: DC

## 2018-04-07 MED ORDER — CLOPIDOGREL BISULFATE 75 MG PO TABS: 75.0000 mg | ORAL_TABLET | Freq: Every day | ORAL | 3 refills | Status: DC

## 2018-04-07 MED ORDER — EZETIMIBE 10 MG PO TABS: 10.0000 mg | ORAL_TABLET | Freq: Every day | ORAL | 3 refills | Status: DC

## 2018-04-07 NOTE — Patient Instructions (Addendum)
  Medication Instructions:  1) Increase metoprolol tartrate 25 mg- take 1 tablet twice a day  2) Decrease amlodipine 10 mg- take 1/2 tablet (5 mg) by mouth once daily  3) Start zetia 10 mg- take 1 tablet by mouth once a day, for cholesterol  4) Stay on crestor  If you need a refill on your cardiac medications before your next appointment, please call your pharmacy.    Lab work: No new labs needed   If you have labs (blood work) drawn today and your tests are completely normal, you will receive your results only by: Marland Kitchen MyChart Message (if you have MyChart) OR . A paper copy in the mail If you have any lab test that is abnormal or we need to change your treatment, we will call you to review the results.   Testing/Procedures: No new testing needed   Follow-Up: At [facility], you and your health needs are our priority.  As part of our continuing mission to provide you with exceptional heart care, we have created designated Provider Care Teams.  These Care Teams include your primary Cardiologist (physician) and Advanced Practice Providers (APPs -  Physician Assistants and Nurse Practitioners) who all work together to provide you with the care you need, when you need it.  . You will need a follow up appointment in 12 months .   Please call our office 2 months in advance to schedule this appointment.    . Providers on your designated Care Team:   . Murray Hodgkins, NP . Christell Faith, PA-C . Marrianne Mood, PA-C  Any Other Special Instructions Will Be Listed Below (If Applicable).  For educational health videos Log in to : [email or web address] Or : [email or web address], password : [email or web address]

## 2018-04-19 ENCOUNTER — Ambulatory Visit (INDEPENDENT_AMBULATORY_CARE_PROVIDER_SITE_OTHER): Payer: PPO | Admitting: Pulmonary Disease

## 2018-04-19 ENCOUNTER — Encounter: Payer: Self-pay | Admitting: Pulmonary Disease

## 2018-04-19 VITALS — BP 92/60 | HR 48 | Ht 62.0 in | Wt 145.2 lb

## 2018-04-19 DIAGNOSIS — G4719 Other hypersomnia: Secondary | ICD-10-CM | POA: Diagnosis not present

## 2018-04-19 DIAGNOSIS — J449 Chronic obstructive pulmonary disease, unspecified: Secondary | ICD-10-CM

## 2018-04-19 DIAGNOSIS — I272 Pulmonary hypertension, unspecified: Secondary | ICD-10-CM | POA: Diagnosis not present

## 2018-04-19 DIAGNOSIS — Z87891 Personal history of nicotine dependence: Secondary | ICD-10-CM | POA: Diagnosis not present

## 2018-04-19 DIAGNOSIS — R0683 Snoring: Secondary | ICD-10-CM | POA: Diagnosis not present

## 2018-04-19 DIAGNOSIS — G4734 Idiopathic sleep related nonobstructive alveolar hypoventilation: Secondary | ICD-10-CM | POA: Diagnosis not present

## 2018-04-19 MED ORDER — TIOTROPIUM BROMIDE-OLODATEROL 2.5-2.5 MCG/ACT IN AERS: 2.0000 | INHALATION_SPRAY | Freq: Every day | RESPIRATORY_TRACT | 10 refills | Status: DC

## 2018-04-19 MED ORDER — TIOTROPIUM BROMIDE-OLODATEROL 2.5-2.5 MCG/ACT IN AERS: 2.0000 | INHALATION_SPRAY | Freq: Every day | RESPIRATORY_TRACT | 0 refills | Status: DC

## 2018-04-19 NOTE — Progress Notes (Signed)
PROBLEMS: Former smoker (1/2 PPD x40 years, quit 1994)  COPD with significant asthmatic component. FEV1 67% pred 2014. PFTs 07/25/15: Mild-mod obstruction. FEV1 1.22 > 1.31 liters (65 > 69% pred)  DATA: 0/26/11 CT chest: Moderate changes of emphysema predominantly affecting both upper lobes 02/26/15 echocardiogram: LVEF 60-65%.  Grade 1 diastolic dysfunction.  Mild-moderate MR.  LA is mildly dilated.  PA pressures estimated within normal range. 07/25/15 PFTs: FVC: 2.20 > 2.32 L (87 > 92 %pred), FEV1: 1.22 > 1.31 L (65 > 69 %pred), FEV1/FVC: 57%, TLC: 3.81 L (80 %pred), DLCO 79% %pred.  Gas dilution technique probably underestimating TLC 08/11/16 echocardiogram: LVEF 60-65%.  Grade 1 diastolic dysfunction.  Mild-moderate MR.  LA mildly dilated.  RV systolic pressure estimate 57 mmHg.  RA and RV both mildly dilated. 0/18/19 echocardiogram: LVEF 55-60%.  Mild-moderate MR.  LA mildly dilated.  RV systolic pressure estimate 49 mmHg.  Right atrium moderately-severely dilated. 01/18/2018 overnight oximetry: Lowest SPO2 81%.  Total time less than 88%: 213 minutes (out of 228 minutes total sleep time).  Nocturnal oxygen initiated on the basis of these findings  INTERVAL: Last seen 8/21.  Overnight oximetry performed with results as above.  Nocturnal oxygen initiated  SUBJ: This is a scheduled follow-up.  However, she does report increasing exertional dyspnea with some day-to-day and moment to moment variation.  She believes that her symptoms are worse in the cold.  She is using her rescue inhaler up to 2 times per day.  She is maintained on Anoro inhaler but reports that it causes her to cough and wonders whether she is getting any benefit from this due to coughing immediately after use.  With regard to the nocturnal oxygen, wearing it compliantly.  She again describes heavy snoring (as reported to her by her husband).  She also reports frequent nocturnal awakenings and significant daytime hypersomnolence.    OBJ: Vitals:   04/19/18 0938 04/19/18 0948  BP:  92/60  Pulse:  (!) 48  SpO2:  94%  Weight: 145 lb 3.2 oz (65.9 kg)   Height: 5\' 2"  (1.575 m)   RA  Gen: NAD HEENT: NCAT, sclera white Neck: No JVD Lungs: breath sounds full, no wheezes or other adventitious sounds Cardiovascular: RRR, no murmurs Abdomen: Soft, nontender, normal BS Ext: without clubbing, cyanosis, edema Neuro: grossly intact Skin: Limited exam, no lesions noted     DATA: BMP Latest Ref Rng & Units 03/09/2018 01/11/2018 12/01/2017  Glucose 65 - 99 mg/dL 97 104(H) 89  BUN 8 - 27 mg/dL 15 14 13   Creatinine 0.57 - 1.00 mg/dL 0.88 0.91 0.87  BUN/Creat Ratio 12 - 28 17 - -  Sodium 134 - 144 mmol/L 145(H) 142 143  Potassium 3.5 - 5.2 mmol/L 3.7 4.3 4.3  Chloride 96 - 106 mmol/L 105 103 103  CO2 20 - 29 mmol/L 25 31 33(H)  Calcium 8.7 - 10.3 mg/dL 9.0 9.3 9.8    CBC Latest Ref Rng & Units 03/09/2018 01/11/2018 12/01/2017  WBC 3.4 - 10.8 x10E3/uL 6.1 6.4 6.5  Hemoglobin 11.1 - 15.9 g/dL 12.5 14.7 14.5  Hematocrit 34.0 - 46.6 % 38.1 43.3 42.7  Platelets 150 - 450 x10E3/uL 182 160 160.0    CXR: No anew film   IMPRESSION:   ICD-10-CM   1. Former smoker Z87.891   2. COPD, moderate ([place]) J44.9   3. Nocturnal hypoxemia G47.34   4. Excessive daytime sleepiness G47.19 Split night study  5. Pulmonary hypertension (HCC) I27.20   6. Snoring  R06.83    She is having difficulty with DPI.  Will change to aerosol MDI.  Pulmonary hypertension is likely on the basis of COPD and nocturnal hypoxemia. Need to rule out OSA     PLAN: Change Anoro inhaler to [employer].  Sample provided Continue albuterol inhaler as needed for increased shortness of breath, cough, wheezing, chest tightness Continue oxygen therapy with sleep for now Sleep study has been ordered to evaluate for sleep apnea We will contact her with results of the sleep study and further therapy if warranted Follow-up in 2-3 months  Merton Border, MD PCCM  service Mobile [phone] Pager [phone] 04/19/2018 1:06 PM

## 2018-04-19 NOTE — Patient Instructions (Addendum)
Change Anoro inhaler to [employer].  Sample provided Continue albuterol inhaler as needed for increased shortness of breath, cough, wheezing, chest tightness Continue oxygen therapy with sleep Sleep study has been ordered to evaluate for sleep apnea We will contact you with results of the sleep study and further therapy if warranted Follow-up in 2-3 months

## 2018-04-20 ENCOUNTER — Ambulatory Visit: Payer: PPO | Admitting: Cardiology

## 2018-04-20 ENCOUNTER — Telehealth: Payer: Self-pay

## 2018-04-20 ENCOUNTER — Encounter: Payer: Self-pay | Admitting: Cardiology

## 2018-04-20 VITALS — BP 110/60 | HR 51 | Ht 62.0 in | Wt 144.0 lb

## 2018-04-20 DIAGNOSIS — I25810 Atherosclerosis of coronary artery bypass graft(s) without angina pectoris: Secondary | ICD-10-CM

## 2018-04-20 DIAGNOSIS — I10 Essential (primary) hypertension: Secondary | ICD-10-CM

## 2018-04-20 DIAGNOSIS — I471 Supraventricular tachycardia: Secondary | ICD-10-CM

## 2018-04-20 MED ORDER — DRONEDARONE HCL 400 MG PO TABS: 400.0000 mg | ORAL_TABLET | Freq: Two times a day (BID) | ORAL | 3 refills | Status: DC

## 2018-04-20 NOTE — Telephone Encounter (Signed)
We received a coverage determination request form from [employer] for the pts Multaq. I have completed the form, Dr Curt Bears has signed it and I have faxed it back to [employer].

## 2018-04-20 NOTE — Progress Notes (Signed)
Electrophysiology Office Note   Date:  04/20/2018   ID:  Andrea [name], DOB 1940/10/30, MRN [id number]  PCP:  Mosie Lukes, MD  Cardiologist:  Rockey Situ Primary Electrophysiologist:  Gricelda Foland Meredith Leeds, MD    No chief complaint on file.    History of Present Illness: Andrea [name] is a 77 y.o. female who is being seen today for the evaluation of SVT at the request of Christell Faith. Presenting today for electrophysiology evaluation.  She has a history of coronary disease status post CABG in 1999 status post PCI to the distal RCA in 2013, PCI x2 to the SVG to the RCA into the mid circumflex in 2017, PVD status post lower extremity stenting, carotid artery disease status post left-sided CEA, pulmonary hypertension, valvular heart disease, COPD, infrarenal AAA, PE, hypertension, hyperlipidemia, asthma, hypothyroidism.  Her a Holter monitor with episodes of SVT/atrial tachycardia, longest lasting 14 beats with frequent PACs and rare PVCs.  She has palpitations on a daily basis.  They last between 5 and 10 minutes at times.  There are no exacerbating or alleviating factors.  Had an EP study on 03/19/2018 which did not induce tachycardia.    Today, denies symptoms of palpitations, chest pain, shortness of breath, orthopnea, PND, lower extremity edema, claudication, dizziness, presyncope, syncope, bleeding, or neurologic sequela. The patient is tolerating medications without difficulties.  She continues to have episodic palpitations they occur most days.  Most the time they are short-lived, but at times they last for most of the day.  She has palpitations and some weakness and fatigue.   Past Medical History:  Diagnosis Date  . AAA (abdominal aortic aneurysm) ([place]) 01/2009   AAA (2.8 x 3.0)  moderate RAS (left); 2.7 x 2.7 cm (07/11/05)  . Acute bronchitis 03/20/2013; 2017  . Angina   . Anosmia   . Asthma   . Baker's cyst of knee 01/30/2014  . Basal cell carcinoma    "back and left arm"  .  Bradycardia    Metoprolol stopped 08/2011  . CAD (coronary artery disease)    s/CABG (reports IMA and 2 SVGs) back in 1999  Myoview normal 3/10; s/p PCI with DES to PL branch of distal RCA 09/2011; PCI +DES to SVG-RCA, PCI + DES to mid LCx 12/2015  . Cerebrovascular disease 01/2009   carotid u/s  R 0-39%   L 60-79%  . Chronic thoracic back pain   . COPD (chronic obstructive pulmonary disease) ([place])   . Depression   . Dizziness   . Dysrhythmia    hx of sinus brady  . GERD (gastroesophageal reflux disease)   . Heart murmur   . Herniated lumbar disc without myelopathy   . History of blood transfusion 1999   "when I had the bypass; had a PE"  . Hyperglycemia 10/23/2015  . Hyperlipidemia   . Hypertension   . Hypothyroidism 09/30/2016  . Medicare annual wellness visit, subsequent 07/31/2013  . NSTEMI (non-ST elevated myocardial infarction) ([place]) 11/12   Cath showed atretic IMA graft to the LAD, SVG to PD was patent but the continuation of this graft to the PL branch was occluded; there were L to R collaterals; Mid LAD had a 60 to 70% stenosis. She has been treated medically.  Neg Myoview 05/2011  . Osteoarthritis of back   . Osteoporosis   . Pneumonia "several times"  . Pulmonary embolism ([place]) 1999   "after my bypass"  . PVD (peripheral vascular disease) ([place])   . Shortness of  breath   . Squamous carcinoma    "nose"  . Thyroid disease    Hypothyroid  . Unstable angina ([place]) 09/25/2015   Past Surgical History:  Procedure Laterality Date  . ABDOMINAL AORTIC ANEURYSM REPAIR     pt denies this hx on 01/02/2016  . BASAL CELL CARCINOMA EXCISION    . CARDIAC CATHETERIZATION N/A 01/02/2016   Procedure: Left Heart Cath and Coronary Angiography;  Surgeon: Wellington Hampshire, MD;  Location: [facility];  Service: Cardiovascular;  Laterality: N/A;  . CARDIAC CATHETERIZATION  1996; 1999  . CARDIAC CATHETERIZATION N/A 06/25/2016   Procedure: Left Heart Cath and Cors/Grafts Angiography;  Surgeon:  Wellington Hampshire, MD;  Location: [facility];  Service: Cardiovascular;  Laterality: N/A;  . CAROTID ENDARTERECTOMY Left 01/02/2011  . CATARACT EXTRACTION W/ INTRAOCULAR LENS  IMPLANT, BILATERAL    . CLOSED REDUCTION NASAL FRACTURE  11/2007  . CORONARY ANGIOPLASTY WITH STENT PLACEMENT  10/10/2011   drug eluting  to rc & saphenous  . CORONARY ARTERY BYPASS GRAFT  1999   "CABG X3"  . DILATION AND CURETTAGE OF UTERUS    . FEMORAL ARTERY STENT Bilateral   . FRACTURE SURGERY    . LEFT HEART CATHETERIZATION WITH CORONARY/GRAFT ANGIOGRAM N/A 04/22/2011   Procedure: LEFT HEART CATHETERIZATION WITH Beatrix Fetters;  Surgeon: Jolaine Artist, MD;  Location: [facility];  Service: Cardiovascular;  Laterality: N/A;  . LEFT HEART CATHETERIZATION WITH CORONARY/GRAFT ANGIOGRAM N/A 10/10/2011   Procedure: LEFT HEART CATHETERIZATION WITH Beatrix Fetters;  Surgeon: Peter M Martinique, MD;  Location: [facility] CATH [facility];  Service: Cardiovascular;  Laterality: N/A;  . NASAL SINUS SURGERY     twice  . SQUAMOUS CELL CARCINOMA EXCISION    . SVT ABLATION N/A 03/19/2018   Procedure: SVT ABLATION;  Surgeon: Constance Haw, MD;  Location: [place] [facility];  Service: Cardiovascular;  Laterality: N/A;  . TUBAL LIGATION       Current Outpatient Medications  Medication Sig Dispense Refill  . acetaminophen (TYLENOL) 650 MG CR tablet Take 1,300 mg by mouth 2 (two) times daily.    Marland Kitchen albuterol (PROVENTIL HFA;VENTOLIN HFA) 108 (90 Base) MCG/ACT inhaler INHALE 2 PUFFS INTO THE LUNGS EVERY 6 (SIX) HOURS AS NEEDED FOR WHEEZING. (Patient taking differently: Inhale 2 puffs into the lungs every 6 (six) hours as needed for wheezing or shortness of breath. ) 8.5 Inhaler 1  . amLODipine (NORVASC) 10 MG tablet Take 0.5 tablets (5 mg total) by mouth daily. 180 tablet 3  . Biotin 5 MG CAPS Take 5 mg by mouth daily.    . Calcium Carbonate-Vitamin D (CALCIUM + D PO) Take 1 tablet by mouth 2 (two) times daily.    .  Cholecalciferol (VITAMIN D3) 2000 units TABS Take 2,000 Units by mouth 2 (two) times daily.    . clopidogrel (PLAVIX) 75 MG tablet Take 1 tablet (75 mg total) by mouth daily. 90 tablet 3  . ezetimibe (ZETIA) 10 MG tablet Take 1 tablet (10 mg total) by mouth daily. 90 tablet 3  . famotidine (PEPCID) 40 MG tablet Take 1 tablet (40 mg total) by mouth at bedtime. 30 tablet 3  . fluticasone (FLONASE) 50 MCG/ACT nasal spray Place 2 sprays into both nostrils daily. For congestion (Patient taking differently: Place 2 sprays into both nostrils daily as needed for allergies. ) 48 g 3  . furosemide (LASIX) 20 MG tablet Take 20 mg by mouth every Monday, Wednesday, and Friday.     Marland Kitchen  HYDROcodone-acetaminophen (NORCO) 10-325 MG tablet Take 1 tablet by mouth every 6 (six) hours as needed for severe pain.    . isosorbide mononitrate (IMDUR) 60 MG 24 hr tablet Take 1 tablet (60 mg total) by mouth 2 (two) times daily. 180 tablet 3  . Krill Oil 1000 MG CAPS Take 1,000 mg by mouth daily.    Marland Kitchen levothyroxine (SYNTHROID, LEVOTHROID) 25 MCG tablet TAKE 1 TABLET (25 MCG TOTAL) BY MOUTH DAILY BEFORE BREAKFAST. 90 tablet 3  . Melatonin 10 MG CAPS Take 10 mg by mouth at bedtime.    . metoprolol tartrate (LOPRESSOR) 25 MG tablet 12.5 mg 2 (two) times daily. Take 1 tablet (25 mg) by mouth twice daily    . nitroGLYCERIN (NITROSTAT) 0.4 MG SL tablet PLACE 1 TABLET (0.4 MG TOTAL) UNDER THE TONGUE EVERY 5 (FIVE) MINUTES AS NEEDED FOR CHEST PAIN. 25 tablet 0  . omeprazole (PRILOSEC) 20 MG capsule Take 1 capsule (20 mg total) by mouth daily. 30 capsule 3  . Polyethyl Glycol-Propyl Glycol (SYSTANE OP) Place 1 drop into both eyes 3 (three) times daily as needed (for dry eyes).     . potassium chloride (K-DUR) 10 MEQ tablet Take 1 tablet (10 mEq total) by mouth daily as needed. (Patient taking differently: Take 10 mEq by mouth daily as needed (for cramping/lab results.). ) 90 tablet 3  . rosuvastatin (CRESTOR) 40 MG tablet TAKE 1 TABLET  (40 MG TOTAL) BY MOUTH DAILY. 90 tablet 3  . sertraline (ZOLOFT) 50 MG tablet TAKE 1 TABLET BY MOUTH EVERY DAY (Patient taking differently: Take 50 mg by mouth daily. ) 90 tablet 1  . Tiotropium Bromide-Olodaterol (STIOLTO RESPIMAT) 2.5-2.5 MCG/ACT AERS Inhale 2 puffs into the lungs daily. 4 g 10  . Vitamin D, Ergocalciferol, (DRISDOL) 50000 units CAPS capsule TAKE 1 CAPSULE (50,000 UNITS TOTAL) BY MOUTH EVERY 7 (SEVEN) DAYS. (Patient taking differently: Take 50,000 Units by mouth every Monday. ) 12 capsule 1  . zinc gluconate 50 MG tablet Take 50 mg by mouth daily.     No current facility-administered medications for this visit.     Allergies:   Codeine; Erythromycin; Meperidine; Shellfish allergy; Ciprofloxacin; Penicillins; and Tape   Social History:  The patient  reports that she quit smoking about 25 years ago. Her smoking use included cigarettes. She has a 20.00 pack-year smoking history. She has never used smokeless tobacco. She reports that she drinks alcohol. She reports that she does not use drugs.   Family History:  The patient's family history includes Alcohol abuse in her brother; Aneurysm in her brother; Cirrhosis in her maternal grandfather; Colon cancer (age of onset: 15) in her father; Diabetes in her maternal grandmother and mother; Heart attack in her brother and brother; Heart attack (age of onset: 63) in her mother; Heart disease in her brother, brother, and father; Hyperlipidemia in her daughter; Hypertension in her brother and father; Hypothyroidism in her sister; Lung cancer in her father and sister; Obesity in her daughter and son; Stroke in her maternal grandmother and paternal grandmother.    ROS:  Please see the history of present illness.   Otherwise, review of systems is positive for ornis of breath, palpitations, back pain.   All other systems are reviewed and negative.   PHYSICAL EXAM: VS:  BP 110/60   Pulse (!) 51   Ht 5\' 2"  (1.575 m)   Wt 144 lb (65.3 kg)    BMI 26.34 kg/m  , BMI Body mass index is 26.34  kg/m. GEN: Well nourished, well developed, in no acute distress  HEENT: normal  Neck: no JVD, carotid bruits, or masses Cardiac: RRR; no murmurs, rubs, or gallops,no edema  Respiratory:  clear to auscultation bilaterally, normal work of breathing GI: soft, nontender, nondistended, + BS MS: no deformity or atrophy  Skin: warm and dry Neuro:  Strength and sensation are intact Psych: euthymic mood, full affect  EKG:  EKG is ordered today. Personal review of the ekg ordered shows sinus rhythm, rate 51, diffuse T wave inversions  Recent Labs: 12/01/2017: ALT 11; TSH 2.77 03/09/2018: BUN 15; Creatinine, Ser 0.88; Hemoglobin 12.5; Platelets 182; Potassium 3.7; Sodium 145    Lipid Panel     Component Value Date/Time   CHOL 148 12/01/2017 1404   CHOL 253 (H) 03/07/2016 0930   TRIG 166.0 (H) 12/01/2017 1404   HDL 36.50 (L) 12/01/2017 1404   HDL 32 (L) 03/07/2016 0930   CHOLHDL 4 12/01/2017 1404   VLDL 33.2 12/01/2017 1404   LDLCALC 78 12/01/2017 1404   LDLCALC 183 (H) 03/07/2016 0930   LDLDIRECT 74.0 09/30/2016 1458     Wt Readings from Last 3 Encounters:  04/20/18 144 lb (65.3 kg)  04/19/18 145 lb 3.2 oz (65.9 kg)  04/07/18 144 lb 12 oz (65.7 kg)      Other studies Reviewed: Additional studies/ records that were reviewed today include: TTE 12/10/17  Review of the above records today demonstrates:  - Left ventricle: The cavity size was normal. Wall thickness was   increased in a pattern of mild LVH. Systolic function was normal.   The estimated ejection fraction was in the range of 55% to 60%.   Wall motion was normal; there were no regional wall motion   abnormalities. - Aortic valve: Valve area (VTI): 1.43 cm^2. Valve area (Vmax):   1.23 cm^2. Valve area (Vmean): 1.24 cm^2. - Mitral valve: There was mild to moderate regurgitation. - Left atrium: The atrium was mildly dilated. - Right atrium: The atrium was moderately to  severely dilated. - Tricuspid valve: There was moderate regurgitation. - Pulmonary arteries: PA peak pressure: 49 mm Hg (S).  Holter 01/10/18 - personally reviewed Normal sinus rhythm Rare episodes of SVT/atrial tachycardia, 130 runs longest run 14 beats with rate 138 bpm Frequent PACs, 7%  Rare PVCs, 1% Rare PVCs and bigemeny  ASSESSMENT AND PLAN:  1.  SVT: He is status post EP study which was noninducible for tachycardia.  He is unfortunately continued to have episodes of SVT that are symptomatic.  She Caidyn Blossom not tolerate amiodarone due to lung disease, and flecainide due to coronary disease.  We [place] Marchitto start her on Multitak today.  2.  Hypertension: Well-controlled today.  No changes.  3.  Coronary artery disease status post CABG without angina: No current chest pain.  Continue Plavix.  4.  Lower extremity edema: Tolerating Lasix.  No changes.  Current medicines are reviewed at length with the patient today.   The patient does not have concerns regarding her medicines.  The following changes were made today: Start Multitak  Labs/ tests ordered today include:  No orders of the defined types were placed in this encounter.    Disposition:   FU with Hortencia Martire 3 months  Signed, Vontrell Pullman Meredith Leeds, MD  04/20/2018 12:38 PM     [facility] [address] [place] [address] [phone] (office) [phone] (fax)

## 2018-04-20 NOTE — Patient Instructions (Addendum)
Medication Instructions:  Your physician has recommended you make the following change in your medication:  1. START Multaq 400 mg twice a day  * If you need a refill on your cardiac medications before your next appointment, please call your pharmacy.   Labwork: None ordered  Testing/Procedures: None ordered  Follow-Up: Your physician recommends that you schedule a follow-up appointment in: 3 months with Dr. Curt Bears.   Thank you for choosing [facility]!!   Trinidad Curet, RN [phone]  Any Other Special Instructions Will Be Listed Below (If Applicable).

## 2018-04-20 NOTE — Telephone Encounter (Signed)
**Note De-Identified  Obfuscation** I have done a Multaq PA through covermymeds. Key: [id number]

## 2018-04-27 NOTE — Telephone Encounter (Signed)
Letter received via fax from [employer] stating that they have approved coverage for Multac. Approval good from 04/21/2018 until 05/26/2019.  I have notified the pts pharmacy of this approval.

## 2018-04-28 DIAGNOSIS — I270 Primary pulmonary hypertension: Secondary | ICD-10-CM | POA: Diagnosis not present

## 2018-04-28 DIAGNOSIS — J449 Chronic obstructive pulmonary disease, unspecified: Secondary | ICD-10-CM | POA: Diagnosis not present

## 2018-05-11 ENCOUNTER — Other Ambulatory Visit: Payer: Self-pay | Admitting: Cardiovascular Disease

## 2018-05-12 ENCOUNTER — Ambulatory Visit: Payer: PPO | Attending: Internal Medicine

## 2018-05-12 DIAGNOSIS — G4710 Hypersomnia, unspecified: Secondary | ICD-10-CM | POA: Diagnosis present

## 2018-05-12 DIAGNOSIS — G4761 Periodic limb movement disorder: Secondary | ICD-10-CM | POA: Diagnosis not present

## 2018-05-12 DIAGNOSIS — G4736 Sleep related hypoventilation in conditions classified elsewhere: Secondary | ICD-10-CM | POA: Insufficient documentation

## 2018-05-12 DIAGNOSIS — G4733 Obstructive sleep apnea (adult) (pediatric): Secondary | ICD-10-CM | POA: Diagnosis not present

## 2018-05-14 DIAGNOSIS — G4733 Obstructive sleep apnea (adult) (pediatric): Secondary | ICD-10-CM | POA: Diagnosis not present

## 2018-05-17 ENCOUNTER — Telehealth: Payer: Self-pay | Admitting: *Deleted

## 2018-05-17 DIAGNOSIS — J449 Chronic obstructive pulmonary disease, unspecified: Secondary | ICD-10-CM

## 2018-05-17 DIAGNOSIS — G4733 Obstructive sleep apnea (adult) (pediatric): Secondary | ICD-10-CM

## 2018-05-17 NOTE — Telephone Encounter (Signed)
Pt informed of results of sleep study. Orders pending placement after DS reviews.  AHI 5.1 Recommendations: Auto cpap with pressure range of 5-15 cmH20 Also, repeat ONO on cpap.

## 2018-05-18 NOTE — Telephone Encounter (Signed)
Initiate AutoCPAP as recommended  Thanks  Waunita Schooner

## 2018-05-24 ENCOUNTER — Ambulatory Visit (INDEPENDENT_AMBULATORY_CARE_PROVIDER_SITE_OTHER): Payer: PPO | Admitting: Internal Medicine

## 2018-05-24 ENCOUNTER — Encounter: Payer: Self-pay | Admitting: Internal Medicine

## 2018-05-24 VITALS — BP 112/66 | HR 66 | Temp 99.0°F | Resp 16 | Ht 62.0 in | Wt 142.2 lb

## 2018-05-24 DIAGNOSIS — J441 Chronic obstructive pulmonary disease with (acute) exacerbation: Secondary | ICD-10-CM | POA: Diagnosis not present

## 2018-05-24 MED ORDER — AZELASTINE HCL 0.1 % NA SOLN: 2.0000 | Freq: Every evening | NASAL | 3 refills | Status: DC | PRN

## 2018-05-24 MED ORDER — HYDROCODONE-HOMATROPINE 5-1.5 MG/5ML PO SYRP: 5.0000 mL | ORAL_SOLUTION | Freq: Every evening | ORAL | 0 refills | Status: DC | PRN

## 2018-05-24 MED ORDER — DOXYCYCLINE HYCLATE 100 MG PO TABS: 100.0000 mg | ORAL_TABLET | Freq: Two times a day (BID) | ORAL | 0 refills | Status: DC

## 2018-05-24 MED ORDER — PREDNISONE 10 MG PO TABS: ORAL_TABLET | ORAL | 0 refills | Status: DC

## 2018-05-24 NOTE — Telephone Encounter (Signed)
Pt aware results of sleep study, orders placed for CPAP and ONO.

## 2018-05-24 NOTE — Progress Notes (Signed)
Pre visit review using our clinic review tool, if applicable. No additional management support is needed unless otherwise documented below in the visit note. 

## 2018-05-24 NOTE — Progress Notes (Signed)
Subjective:    [name], female    DOB: February 13, 1941, 77 y.o.   MRN: [id number]  DOS:  05/24/2018 Type of visit - description: Acute Symptoms of started 10 days ago with sweats, cough and shortness of breath above baseline, whitish sputum production. She is spent 2 to 3 days in bed resting, by Christmas Day she was out of bed but she will does not feel well. Continue with similar symptoms.  + Nasal discharge.  Ears feel clogged. On Mucinex. Reports she is taking Anoro and albuterol.   Review of Systems   Subjective fever+ and some chills. No actual chest pain, edema or palpitations. Had nausea and vomited once with onset of symptoms but currently only slt nauseated  Past Medical History:  Diagnosis Date  . AAA (abdominal aortic aneurysm) ([place]) 01/2009   AAA (2.8 x 3.0)  moderate RAS (left); 2.7 x 2.7 cm (07/11/05)  . Acute bronchitis 03/20/2013; 2017  . Angina   . Anosmia   . Asthma   . Baker's cyst of knee 01/30/2014  . Basal cell carcinoma    "back and left arm"  . Bradycardia    Metoprolol stopped 08/2011  . CAD (coronary artery disease)    s/CABG (reports IMA and 2 SVGs) back in 1999  Myoview normal 3/10; s/p PCI with DES to PL branch of distal RCA 09/2011; PCI +DES to SVG-RCA, PCI + DES to mid LCx 12/2015  . Cerebrovascular disease 01/2009   carotid u/s  R 0-39%   L 60-79%  . Chronic thoracic back pain   . COPD (chronic obstructive pulmonary disease) ([place])   . Depression   . Dizziness   . Dysrhythmia    hx of sinus brady  . GERD (gastroesophageal reflux disease)   . Heart murmur   . Herniated lumbar disc without myelopathy   . History of blood transfusion 1999   "when I had the bypass; had a PE"  . Hyperglycemia 10/23/2015  . Hyperlipidemia   . Hypertension   . Hypothyroidism 09/30/2016  . Medicare annual wellness visit, subsequent 07/31/2013  . NSTEMI (non-ST elevated myocardial infarction) ([place]) 11/12   Cath showed atretic IMA graft to the LAD, SVG to  PD was patent but the continuation of this graft to the PL branch was occluded; there were L to R collaterals; Mid LAD had a 60 to 70% stenosis. She has been treated medically.  Neg Myoview 05/2011  . Osteoarthritis of back   . Osteoporosis   . Pneumonia "several times"  . Pulmonary embolism ([place]) 1999   "after my bypass"  . PVD (peripheral vascular disease) ([place])   . Shortness of breath   . Squamous carcinoma    "nose"  . Thyroid disease    Hypothyroid  . Unstable angina ([place]) 09/25/2015    Past Surgical History:  Procedure Laterality Date  . ABDOMINAL AORTIC ANEURYSM REPAIR     pt denies this hx on 01/02/2016  . BASAL CELL CARCINOMA EXCISION    . CARDIAC CATHETERIZATION N/A 01/02/2016   Procedure: Left Heart Cath and Coronary Angiography;  Surgeon: Wellington Hampshire, MD;  Location: [facility];  Service: Cardiovascular;  Laterality: N/A;  . CARDIAC CATHETERIZATION  1996; 1999  . CARDIAC CATHETERIZATION N/A 06/25/2016   Procedure: Left Heart Cath and Cors/Grafts Angiography;  Surgeon: Wellington Hampshire, MD;  Location: [facility];  Service: Cardiovascular;  Laterality: N/A;  . CAROTID ENDARTERECTOMY Left 01/02/2011  . CATARACT EXTRACTION W/ INTRAOCULAR  LENS  IMPLANT, BILATERAL    . CLOSED REDUCTION NASAL FRACTURE  11/2007  . CORONARY ANGIOPLASTY WITH STENT PLACEMENT  10/10/2011   drug eluting  to rc & saphenous  . CORONARY ARTERY BYPASS GRAFT  1999   "CABG X3"  . DILATION AND CURETTAGE OF UTERUS    . FEMORAL ARTERY STENT Bilateral   . FRACTURE SURGERY    . LEFT HEART CATHETERIZATION WITH CORONARY/GRAFT ANGIOGRAM N/A 04/22/2011   Procedure: LEFT HEART CATHETERIZATION WITH Beatrix Fetters;  Surgeon: Jolaine Artist, MD;  Location: [facility] C[facility];  Service: Cardiovascular;  Laterality: N/A;  . LEFT HEART CATHETERIZATION WITH CORONARY/GRAFT ANGIOGRAM N/A 10/10/2011   Procedure: LEFT HEART CATHETERIZATION WITH Beatrix Fetters;  Surgeon: Peter M Martinique, MD;   Location: [facility] CATH LAB;  Service: Cardiovascular;  Laterality: N/A;  . NASAL SINUS SURGERY     twice  . SQUAMOUS CELL CARCINOMA EXCISION    . SVT ABLATION N/A 03/19/2018   Procedure: SVT ABLATION;  Surgeon: Constance Haw, MD;  Location: [facility] [facility];  Service: Cardiovascular;  Laterality: N/A;  . TUBAL LIGATION      Social History   Socioeconomic History  . Marital status: Married    Spouse name: Not on file  . Number of children: 5  . Years of education: Not on file  . Highest education level: Not on file  Occupational History  . 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 Financial resource strain: Not on file  . Food insecurity:    Worry: Not on file    Inability: Not on file  . Transportation needs:    Medical: Not on file    Non-medical: Not on file  Tobacco Use  . Smoking status: Former Smoker    Packs/day: 0.50    Years: 40.00    Pack years: 20.00    Types: Cigarettes    Last attempt to quit: 01/19/1993    Years since quitting: 25.3  . Smokeless tobacco: Never Used  Substance and Sexual Activity  . Alcohol use: Yes    Comment: 01/02/2016 "might drink a glass of wine socially q 3-4 months"  . Drug use: No  . Sexual activity: Not Currently    Birth control/protection: Post-menopausal  Lifestyle  . Physical activity:    Days per week: Not on file    Minutes per session: Not on file  . Stress: Not on file  Relationships  . Social connections:    Talks on phone: Not on file    Gets together: Not on file    Attends religious service: Not on file    Active member of club or organization: Not on file    Attends meetings of clubs or organizations: Not on file    Relationship status: Not on file  . Intimate partner violence:    Fear of current or ex partner: Not on file    Emotionally abused: Not on file    Physically abused: Not on file    Forced sexual activity: Not on file  Other Topics Concern  . Not on  file  Social History Narrative   Married with 5 children      Allergies as of 05/24/2018      Reactions   Codeine Nausea And Vomiting   Erythromycin Other (See Comments)   Tongue burns   Meperidine Nausea And Vomiting   Shellfish Allergy Nausea And Vomiting   Ciprofloxacin Rash, Other (  See Comments)   Rash IV   Penicillins Rash, Other (See Comments)   Has patient had a PCN reaction causing immediate rash, facial/tongue/throat swelling, SOB or lightheadedness with hypotension:unsure Has patient had a PCN reaction causing severe rash involving mucus membranes or skin necrosis:No Has patient had a PCN reaction that required hospitalization:No Has patient had a PCN reaction occurring within the last 10 years:No If all of the above answers are "NO", then may proceed with Cephalosporin use.   Tape Rash      Medication List       Accurate as of May 24, 2018 11:59 PM. Always use your most recent med list.        acetaminophen 650 MG CR tablet Commonly known as:  TYLENOL Take 1,300 mg by mouth 2 (two) times daily.   albuterol 108 (90 Base) MCG/ACT inhaler Commonly known as:  PROVENTIL HFA;VENTOLIN HFA INHALE 2 PUFFS INTO THE LUNGS EVERY 6 (SIX) HOURS AS NEEDED FOR WHEEZING.   amLODipine 10 MG tablet Commonly known as:  NORVASC Take 0.5 tablets (5 mg total) by mouth daily.   azelastine 0.1 % nasal spray Commonly known as:  ASTELIN Place 2 sprays into both nostrils at bedtime as needed for rhinitis.   Biotin 5 MG Caps Take 5 mg by mouth daily.   CALCIUM + D PO Take 1 tablet by mouth 2 (two) times daily.   clopidogrel 75 MG tablet Commonly known as:  PLAVIX Take 1 tablet (75 mg total) by mouth daily.   doxycycline 100 MG tablet Commonly known as:  VIBRA-TABS Take 1 tablet (100 mg total) by mouth 2 (two) times daily.   dronedarone 400 MG tablet Commonly known as:  MULTAQ Take 1 tablet (400 mg total) by mouth 2 (two) times daily with a meal.   ezetimibe 10 MG  tablet Commonly known as:  ZETIA Take 1 tablet (10 mg total) by mouth daily.   famotidine 40 MG tablet Commonly known as:  PEPCID Take 1 tablet (40 mg total) by mouth at bedtime.   fluticasone 50 MCG/ACT nasal spray Commonly known as:  FLONASE Place 2 sprays into both nostrils daily. For congestion   furosemide 20 MG tablet Commonly known as:  LASIX Take 20 mg by mouth every Monday, Wednesday, and Friday.   HYDROcodone-acetaminophen 10-325 MG tablet Commonly known as:  NORCO Take 1 tablet by mouth every 6 (six) hours as needed for severe pain.   HYDROcodone-homatropine 5-1.5 MG/5ML syrup Commonly known as:  HYCODAN Take 5 mLs by mouth at bedtime as needed for cough.   isosorbide mononitrate 60 MG 24 hr tablet Commonly known as:  IMDUR TAKE 1 TABLET (60 MG TOTAL) BY MOUTH 2 (TWO) TIMES DAILY.   Krill Oil 1000 MG Caps Take 1,000 mg by mouth daily.   levothyroxine 25 MCG tablet Commonly known as:  SYNTHROID, LEVOTHROID TAKE 1 TABLET (25 MCG TOTAL) BY MOUTH DAILY BEFORE BREAKFAST.   Melatonin 10 MG Caps Take 10 mg by mouth at bedtime.   metoprolol tartrate 25 MG tablet Commonly known as:  LOPRESSOR 12.5 mg 2 (two) times daily. Take 1 tablet (25 mg) by mouth twice daily   nitroGLYCERIN 0.4 MG SL tablet Commonly known as:  NITROSTAT PLACE 1 TABLET (0.4 MG TOTAL) UNDER THE TONGUE EVERY 5 (FIVE) MINUTES AS NEEDED FOR CHEST PAIN.   omeprazole 20 MG capsule Commonly known as:  PRILOSEC Take 1 capsule (20 mg total) by mouth daily.   potassium chloride 10 MEQ tablet Commonly known  as:  K-DUR Take 1 tablet (10 mEq total) by mouth daily as needed.   predniSONE 10 MG tablet Commonly known as:  DELTASONE 3 tabs x 3 days, 2 tabs x 3 days, 1 tab x 3 days   rosuvastatin 40 MG tablet Commonly known as:  CRESTOR TAKE 1 TABLET (40 MG TOTAL) BY MOUTH DAILY.   sertraline 50 MG tablet Commonly known as:  ZOLOFT TAKE 1 TABLET BY MOUTH EVERY DAY   SYSTANE OP Place 1 drop into  both eyes 3 (three) times daily as needed (for dry eyes).   Tiotropium Bromide-Olodaterol 2.5-2.5 MCG/ACT Aers Commonly known as:  STIOLTO RESPIMAT Inhale 2 puffs into the lungs daily.   Vitamin D (Ergocalciferol) 1.25 MG (50000 UT) Caps capsule Commonly known as:  DRISDOL TAKE 1 CAPSULE (50,000 UNITS TOTAL) BY MOUTH EVERY 7 (SEVEN) DAYS.   Vitamin D3 50 MCG (2000 UT) Tabs Take 2,000 Units by mouth 2 (two) times daily.   zinc gluconate 50 MG tablet Take 50 mg by mouth daily.           Objective:   Physical Exam BP 112/66 (BP Location: Left Arm, Patient Position: Sitting, Cuff Size: Small)   Pulse 66   Temp 99 F (37.2 C) (Oral)   Resp 16   Ht 5\' 2"  (1.575 m)   Wt 142 lb 4 oz (64.5 kg)   SpO2 91%   BMI 26.02 kg/m  General:   Well developed, NAD, BMI noted. HEENT:  Normocephalic . Face symmetric, atraumatic; nose congested, sinuses no TTP Lungs:  + ronchi (mild/moderate), no crackles , few wheezes  Normal respiratory effort, no intercostal retractions, no accessory muscle use. Heart: RRR,  no murmur.  No pretibial edema bilaterally  Skin: Not pale. Not jaundice Neurologic:  alert & oriented X3.  Speech normal, gait appropriate for age and unassisted Psych--  Cognition and judgment appear intact.  Cooperative with normal attention span and concentration.  Behavior appropriate. No anxious or depressed appearing. \    Assessment     77 year old female, PMH includes pre-DM, CAD, SVT, COPD, former smoker.  Presents with  COPD exacerbation rec doxycycline, prednisone, continue Mucinex. According to last pulmonary note, she is on Stiolto, pt reports she takes Anoro; rec to check her meds at home but needs to be on one or the other  Reports "tessalon" won't work for her, she tolerates low dose codeine or hydrocodone, will RX hydrocodone for night time use  See AVS Call if no better

## 2018-05-24 NOTE — Patient Instructions (Addendum)
Rest, fluids , tylenol  For cough:  Take Mucinex DM twice a day as needed until better Hydrocodone at bedtime for persistent cough  For nasal congestion: Use OTC Flonase : 2 nasal sprays on each side of the nose in the morning until you feel better Use ASTELIN a prescribed spray : 2 nasal sprays on each side of the nose at night until you feel better   Avoid decongestants such as  Pseudoephedrine or phenylephrine   Take the antibiotic as prescribed: Doxycycline  Take prednisone as prescribed  Continue albuterol  Use either Anoro or Stiolto  Call if not gradually better over the next  10 days  Call anytime if the symptoms are severe

## 2018-05-29 DIAGNOSIS — I270 Primary pulmonary hypertension: Secondary | ICD-10-CM | POA: Diagnosis not present

## 2018-05-29 DIAGNOSIS — J449 Chronic obstructive pulmonary disease, unspecified: Secondary | ICD-10-CM | POA: Diagnosis not present

## 2018-06-01 ENCOUNTER — Other Ambulatory Visit: Payer: Self-pay

## 2018-06-01 MED ORDER — AMLODIPINE BESYLATE 5 MG PO TABS: 5.0000 mg | ORAL_TABLET | Freq: Every day | ORAL | 3 refills | Status: DC

## 2018-06-01 NOTE — Telephone Encounter (Signed)
Refill sent for Amlodipine 5 mg, the patient was having a hard time cutting the amlodipine 10 mg pill in half.

## 2018-06-02 DIAGNOSIS — C44212 Basal cell carcinoma of skin of right ear and external auricular canal: Secondary | ICD-10-CM | POA: Diagnosis not present

## 2018-06-02 DIAGNOSIS — X32XXXA Exposure to sunlight, initial encounter: Secondary | ICD-10-CM | POA: Diagnosis not present

## 2018-06-02 DIAGNOSIS — C44629 Squamous cell carcinoma of skin of left upper limb, including shoulder: Secondary | ICD-10-CM | POA: Diagnosis not present

## 2018-06-02 DIAGNOSIS — Z08 Encounter for follow-up examination after completed treatment for malignant neoplasm: Secondary | ICD-10-CM | POA: Diagnosis not present

## 2018-06-02 DIAGNOSIS — Z85828 Personal history of other malignant neoplasm of skin: Secondary | ICD-10-CM | POA: Diagnosis not present

## 2018-06-02 DIAGNOSIS — D485 Neoplasm of uncertain behavior of skin: Secondary | ICD-10-CM | POA: Diagnosis not present

## 2018-06-02 DIAGNOSIS — G4733 Obstructive sleep apnea (adult) (pediatric): Secondary | ICD-10-CM | POA: Diagnosis not present

## 2018-06-02 DIAGNOSIS — L570 Actinic keratosis: Secondary | ICD-10-CM | POA: Diagnosis not present

## 2018-06-02 DIAGNOSIS — L821 Other seborrheic keratosis: Secondary | ICD-10-CM | POA: Diagnosis not present

## 2018-06-15 ENCOUNTER — Ambulatory Visit: Payer: PPO | Admitting: Family Medicine

## 2018-06-16 DIAGNOSIS — C44311 Basal cell carcinoma of skin of nose: Secondary | ICD-10-CM | POA: Diagnosis not present

## 2018-06-16 DIAGNOSIS — L578 Other skin changes due to chronic exposure to nonionizing radiation: Secondary | ICD-10-CM | POA: Diagnosis not present

## 2018-06-16 DIAGNOSIS — L988 Other specified disorders of the skin and subcutaneous tissue: Secondary | ICD-10-CM | POA: Diagnosis not present

## 2018-06-16 DIAGNOSIS — L814 Other melanin hyperpigmentation: Secondary | ICD-10-CM | POA: Diagnosis not present

## 2018-06-18 ENCOUNTER — Telehealth: Payer: Self-pay | Admitting: Cardiology

## 2018-06-18 MED ORDER — DILTIAZEM HCL ER COATED BEADS 180 MG PO CP24: 180.0000 mg | ORAL_CAPSULE | Freq: Every day | ORAL | 6 refills | Status: DC

## 2018-06-18 NOTE — Telephone Encounter (Signed)
New Message    Pt c/o medication issue:  1. Name of Medication: Multaq 400mg    2. How are you currently taking this medication (dosage and times per day)? 400mg  twice a day  3. Are you having a reaction (difficulty breathing--STAT)? Extreme nausea   4. What is your medication issue? Patient states medication makes her extremely nauseas

## 2018-06-18 NOTE — Telephone Encounter (Signed)
Pt reports diaphoresis and severe nausea since starting Multaq.  States that she has tried to take w/ and w/o food but neither makes a difference. Denies any recent illness. Will rv w/ [place] and let her know by Monday recommendation.

## 2018-06-18 NOTE — Telephone Encounter (Signed)
Advised to stop Multaq, per Dr. Curt Bears. Advised to start Diltiazem 180 mg once daily Rx sent to [facility]. Pt will start medication tomorrow. Patient verbalized understanding and agreeable to plan.

## 2018-06-21 ENCOUNTER — Ambulatory Visit: Payer: Self-pay

## 2018-06-21 NOTE — Telephone Encounter (Signed)
Incoming call from Patient with complaint of SOB.   Walking from walking from the car to the house  Patient states she get SOB.  States this is for over a week.  States it comes and goes.   Rates it  Moderate.  While I was attempting   to scheduling  Patient decided to go to her Pulmonologist.                                                                                                                                                                                     Reason for Disposition . [1] MODERATE longstanding difficulty breathing (e.g., speaks in phrases, SOB even at rest, pulse 100-120) AND [2] SAME as normal  Answer Assessment - Initial Assessment Questions 1. RESPIRATORY STATUS: "Describe your breathing?" (e.g., wheezing, shortness of breath, unable to speak, severe coughing)      Sob 2. ONSET: "When did this breathing problem begin?"      Ongoing for a couple weeks  3. PATTERN "Does the difficult breathing come and go, or has it been constant since it started?"      Comes and  gi 4. SEVERITY: "How bad is your breathing?" (e.g., mild, moderate, severe)    - MILD: No SOB at rest, mild SOB with walking, speaks normally in sentences, can lay down, no retractions, pulse < 100.    - MODERATE: SOB at rest, SOB with minimal exertion and prefers to sit, cannot lie down flat, speaks in phrases, mild retractions, audible wheezing, pulse 100-120.    - SEVERE: Very SOB at rest, speaks in single words, struggling to breathe, sitting hunched forward, retractions, pulse > 120      moderate 5. RECURRENT SYMPTOM: "Have you had difficulty breathing before?" If so, ask: "When was the last time?" and "What happened that time?"      *No Answer* 6. CARDIAC HISTORY: "Do you have any history of heart disease?" (e.g., heart attack, angina, bypass surgery, angioplasty)      cabbage asthma  COPD 7. LUNG HISTORY: "Do you have any history of lung disease?"  (e.g., pulmonary embolus, asthma, emphysema)  copd 8. CAUSE: "What do you think is causing the breathing problem?"      *No Answer* 9. OTHER SYMPTOMS: "Do you have any other symptoms? (e.g., dizziness, runny nose, cough, chest pain, fever)     COGH, RUNNY NOSE SINUS PROOMBEMS 10. PREGNANCY: "Is there any chance you are pregnant?" "When was your last menstrual period?"      NA 11. TRAVEL: "Have you traveled out of the country in the last month?" (e.g., travel history, exposures)       DENINES  Protocols  used: BREATHING DIFFICULTY-A-AH

## 2018-06-22 ENCOUNTER — Encounter: Payer: Self-pay | Admitting: Pulmonary Disease

## 2018-06-22 ENCOUNTER — Ambulatory Visit: Payer: PPO | Admitting: Pulmonary Disease

## 2018-06-22 VITALS — BP 120/78 | HR 67 | Ht 63.0 in | Wt 142.6 lb

## 2018-06-22 DIAGNOSIS — I272 Pulmonary hypertension, unspecified: Secondary | ICD-10-CM

## 2018-06-22 DIAGNOSIS — J449 Chronic obstructive pulmonary disease, unspecified: Secondary | ICD-10-CM

## 2018-06-22 DIAGNOSIS — J441 Chronic obstructive pulmonary disease with (acute) exacerbation: Secondary | ICD-10-CM | POA: Diagnosis not present

## 2018-06-22 DIAGNOSIS — G4734 Idiopathic sleep related nonobstructive alveolar hypoventilation: Secondary | ICD-10-CM

## 2018-06-22 DIAGNOSIS — Z87891 Personal history of nicotine dependence: Secondary | ICD-10-CM

## 2018-06-22 DIAGNOSIS — G4733 Obstructive sleep apnea (adult) (pediatric): Secondary | ICD-10-CM | POA: Diagnosis not present

## 2018-06-22 MED ORDER — PREDNISONE 20 MG PO TABS: 40.0000 mg | ORAL_TABLET | Freq: Every day | ORAL | 0 refills | Status: AC

## 2018-06-22 MED ORDER — DOXYCYCLINE HYCLATE 100 MG PO TABS: 100.0000 mg | ORAL_TABLET | Freq: Two times a day (BID) | ORAL | 0 refills | Status: DC

## 2018-06-22 NOTE — Progress Notes (Signed)
PROBLEMS: Former smoker (1/2 PPD x40 years, quit 1994)  COPD with significant asthmatic component. FEV1 67% pred 2014. PFTs 07/25/15: Mild-mod obstruction. FEV1 1.22 > 1.31 liters (65 > 69% pred)  DATA: 0/26/11 CT chest: Moderate changes of emphysema predominantly affecting both upper lobes 02/26/15 echocardiogram: LVEF 60-65%.  Grade 1 diastolic dysfunction.  Mild-moderate MR.  LA is mildly dilated.  PA pressures estimated within normal range. 07/25/15 PFTs: FVC: 2.20 > 2.32 L (87 > 92 %pred), FEV1: 1.22 > 1.31 L (65 > 69 %pred), FEV1/FVC: 57%, TLC: 3.81 L (80 %pred), DLCO 79% %pred.  Gas dilution technique probably underestimating TLC 08/11/16 echocardiogram: LVEF 60-65%.  Grade 1 diastolic dysfunction.  Mild-moderate MR.  LA mildly dilated.  RV systolic pressure estimate 57 mmHg.  RA and RV both mildly dilated. 0/18/19 echocardiogram: LVEF 55-60%.  Mild-moderate MR.  LA mildly dilated.  RV systolic pressure estimate 49 mmHg.  Right atrium moderately-severely dilated. 01/18/2018 overnight oximetry: Lowest SPO2 81%.  Total time less than 88%: 213 minutes (out of 228 minutes total sleep time).  Nocturnal oxygen initiated on the basis of these findings 05/12/18 PSG: AHI 5.1/hr, Lowest SpO2 81%. Recommended AutoSet 5-15 cm H2O  INTERVAL: Last seen 04/19/18.   SUBJ: Is an acute visit.  Since last visit, she has undergone sleep study with results as documented above.  She was initiated on CPAP AutoSet 5-15 cm H2O and is using it compliantly.  This despite the fact that she has just undergone basal cell carcinoma resection on her nose.  She is wearing a full facemask.  She is tolerating it well.  Her supply company is [employer].  She has not yet had overnight oximetry on CPAP.  Her acute visit is for 3-4 weeks of increased shortness of breath, chest tightness, rattling cough, increased albuterol use.  She denies fevers, hemoptysis, pleuritic chest pain.  OBJ: Vitals:   06/22/18 1445  BP: 120/78   Pulse: 67  SpO2: 94%  Weight: 142 lb 9.6 oz (64.7 kg)  Height: 5\' 3"  (1.6 m)  RA  Gen: NAD, band-aid dressing on nose HEENT: NCAT, sclera white Neck: No JVD Lungs: Breath sounds diffusely coarse with diffuse distant wheezes and scattered rhonchi Cardiovascular: RRR, no murmurs Abdomen: Soft, nontender, normal BS Ext: without clubbing, cyanosis, edema Neuro: grossly intact Skin: Limited exam, no lesions noted     DATA: BMP Latest Ref Rng & Units 03/09/2018 01/11/2018 12/01/2017  Glucose 65 - 99 mg/dL 97 104(H) 89  BUN 8 - 27 mg/dL 15 14 13   Creatinine 0.57 - 1.00 mg/dL 0.88 0.91 0.87  BUN/Creat Ratio 12 - 28 17 - -  Sodium 134 - 144 mmol/L 145(H) 142 143  Potassium 3.5 - 5.2 mmol/L 3.7 4.3 4.3  Chloride 96 - 106 mmol/L 105 103 103  CO2 20 - 29 mmol/L 25 31 33(H)  Calcium 8.7 - 10.3 mg/dL 9.0 9.3 9.8    CBC Latest Ref Rng & Units 03/09/2018 01/11/2018 12/01/2017  WBC 3.4 - 10.8 x10E3/uL 6.1 6.4 6.5  Hemoglobin 11.1 - 15.9 g/dL 12.5 14.7 14.5  Hematocrit 34.0 - 46.6 % 38.1 43.3 42.7  Platelets 150 - 450 x10E3/uL 182 160 160.0    CXR: No new film   IMPRESSION:   ICD-10-CM   1. Former smoker Z87.891   2. COPD, moderate ([place]) J44.9   3. Mild OSA G47.33   4. Nocturnal hypoxemia G47.34   5. 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 COPD exacerbation ([place]) J44.1  PLAN: Continue Stiolto inhaler Continue albuterol inhaler as needed Continue CPAP with sleep Schedule overnight oximetry with [employer] after she gets over this current respiratory tract infection New prescription: Prednisone 40 mg (2 x 20 mg tablets) daily for 5 days New prescription: Doxycycline 100 mg twice a day for 7 days Recommended guaifenesin as needed for mucolysis  Follow-up in 4 to 6 weeks.  Call sooner if needed   Merton Border, MD PCCM service Mobile [phone] Pager [phone] 06/22/2018 3:05 PM

## 2018-06-22 NOTE — Patient Instructions (Addendum)
Continue Stiolto inhaler Continue albuterol inhaler as needed Continue CPAP with sleep Schedule overnight oximetry with [employer] after you get over this current respiratory tract infection New prescription: Prednisone 40 mg (2 x 20 mg tablets) daily for 5 days New prescription: Doxycycline 100 mg twice a day for 7 days  Follow-up in 4 to 6 weeks.  Call sooner if needed

## 2018-06-28 ENCOUNTER — Ambulatory Visit (INDEPENDENT_AMBULATORY_CARE_PROVIDER_SITE_OTHER): Payer: PPO | Admitting: Family Medicine

## 2018-06-28 ENCOUNTER — Ambulatory Visit (HOSPITAL_BASED_OUTPATIENT_CLINIC_OR_DEPARTMENT_OTHER)
Admission: RE | Admit: 2018-06-28 | Discharge: 2018-06-28 | Disposition: A | Discharge status: Home / Self Care | Payer: PPO | Source: Ambulatory Visit | Attending: Family Medicine | Admitting: Family Medicine

## 2018-06-28 VITALS — BP 118/71 | HR 65 | Temp 98.0°F | Resp 18 | Ht 63.0 in | Wt 142.9 lb

## 2018-06-28 DIAGNOSIS — R05 Cough: Secondary | ICD-10-CM

## 2018-06-28 DIAGNOSIS — R350 Frequency of micturition: Secondary | ICD-10-CM

## 2018-06-28 DIAGNOSIS — M810 Age-related osteoporosis without current pathological fracture: Secondary | ICD-10-CM | POA: Diagnosis not present

## 2018-06-28 DIAGNOSIS — R059 Cough, unspecified: Secondary | ICD-10-CM

## 2018-06-28 DIAGNOSIS — E782 Mixed hyperlipidemia: Secondary | ICD-10-CM | POA: Diagnosis not present

## 2018-06-28 DIAGNOSIS — I10 Essential (primary) hypertension: Secondary | ICD-10-CM | POA: Diagnosis not present

## 2018-06-28 DIAGNOSIS — R739 Hyperglycemia, unspecified: Secondary | ICD-10-CM | POA: Diagnosis not present

## 2018-06-28 DIAGNOSIS — E039 Hypothyroidism, unspecified: Secondary | ICD-10-CM | POA: Diagnosis not present

## 2018-06-28 DIAGNOSIS — E559 Vitamin D deficiency, unspecified: Secondary | ICD-10-CM

## 2018-06-28 MED ORDER — HYDROCODONE-HOMATROPINE 5-1.5 MG/5ML PO SYRP: 5.0000 mL | ORAL_SOLUTION | Freq: Three times a day (TID) | ORAL | 0 refills | Status: DC | PRN

## 2018-06-28 MED ORDER — AZELASTINE HCL 0.1 % NA SOLN: 2.0000 | Freq: Every evening | NASAL | 3 refills | Status: DC | PRN

## 2018-06-28 MED ORDER — METHYLPREDNISOLONE 4 MG PO TABS: ORAL_TABLET | ORAL | 0 refills | Status: DC

## 2018-06-28 MED ORDER — SULFAMETHOXAZOLE-TRIMETHOPRIM 800-160 MG PO TABS: 1.0000 | ORAL_TABLET | Freq: Two times a day (BID) | ORAL | 0 refills | Status: DC

## 2018-06-28 NOTE — Patient Instructions (Addendum)
Spry or biotin mouth wash to help keep the mouth moist Can try ginger capsules 1/2 prior to meds that cause nausea  Encouraged increased rest and hydration, add probiotics, zinc such as Coldeze or Xicam. Treat fevers as needed, Vitamin C, elderberry  Shingrix is the new shingles shot, 2 shots over 2-6 months, at pharmacy COPD and Physical Activity Chronic obstructive pulmonary disease (COPD) is a long-term (chronic) condition that affects the lungs. COPD is a general term that can be used to describe many different lung problems that cause lung swelling (inflammation) and limit airflow, including chronic bronchitis and emphysema. The main symptom of COPD is shortness of breath, which makes it harder to do even simple tasks. This can also make it harder to exercise and be active. Talk with your health care provider about treatments to help you breathe better and actions you can take to prevent breathing problems during physical activity. What are the benefits of exercising with COPD? Exercising regularly is an important part of a healthy lifestyle. You can still exercise and do physical activities even though you have COPD. Exercise and physical activity improve your shortness of breath by increasing blood flow (circulation). This causes your heart to pump more oxygen through your body. Moderate exercise can improve your:  Oxygen use.  Energy level.  Shortness of breath.  Strength in your breathing muscles.  Heart health.  Sleep.  Self-esteem and feelings of self-worth.  Depression, stress, and anxiety levels. Exercise can benefit everyone with COPD. The severity of your disease may affect how hard you can exercise, especially at first, but everyone can benefit. Talk with your health care provider about how much exercise is safe for you, and which activities and exercises are safe for you. What actions can I take to prevent breathing problems during physical activity?  Sign up for a  pulmonary rehabilitation program. This type of program may include: ? Education about lung diseases. ? Exercise classes that teach you how to exercise and be more active while improving your breathing. This usually involves:  Exercise using your lower extremities, such as a stationary bicycle.  About 30 minutes of exercise, 2 to 5 times per week, for 6 to 12 weeks  Strength training, such as push ups or leg lifts. ? Nutrition education. ? Group classes in which you can talk with others who also have COPD and learn ways to manage stress.  If you use an oxygen tank, you should use it while you exercise. Work with your health care provider to adjust your oxygen for your physical activity. Your resting flow rate is different from your flow rate during physical activity.  While you are exercising: ? Take slow breaths. ? Pace yourself and do not try to go too fast. ? Purse your lips while breathing out. Pursing your lips is similar to a kissing or whistling position. ? If doing exercise that uses a quick burst of effort, such as weight lifting:  Breathe in before starting the exercise.  Breathe out during the hardest part of the exercise (such as raising the weights). Where to find support You can find support for exercising with COPD from:  Your health care provider.  A pulmonary rehabilitation program.  Your local health department or community health programs.  Support groups, online or in-person. Your health care provider may be able to recommend support groups. Where to find more information You can find more information about exercising with COPD from:  American Lung Association: ClassInsider.se.  COPD Foundation:  https://www.rivera.net/. Contact a health care provider if:  Your symptoms get worse.  You have chest pain.  You have nausea.  You have a fever.  You have trouble talking or catching your breath.  You want to start a new exercise program or a new  activity. Summary  COPD is a general term that can be used to describe many different lung problems that cause lung swelling (inflammation) and limit airflow. This includes chronic bronchitis and emphysema.  Exercise and physical activity improve your shortness of breath by increasing blood flow (circulation). This causes your heart to provide more oxygen to your body.  Contact your health care provider before starting any exercise program or new activity. Ask your health care provider what exercises and activities are safe for you. This information is not intended to replace advice given to you by your health care provider. Make sure you discuss any questions you have with your health care provider. Document Released: 06/04/2017 Document Revised: 06/04/2017 Document Reviewed: 06/04/2017 Elsevier Interactive Patient Education  2019 [employer].

## 2018-06-29 DIAGNOSIS — J449 Chronic obstructive pulmonary disease, unspecified: Secondary | ICD-10-CM | POA: Diagnosis not present

## 2018-06-29 DIAGNOSIS — I270 Primary pulmonary hypertension: Secondary | ICD-10-CM | POA: Diagnosis not present

## 2018-06-29 DIAGNOSIS — G4733 Obstructive sleep apnea (adult) (pediatric): Secondary | ICD-10-CM | POA: Diagnosis not present

## 2018-06-29 LAB — URINALYSIS
Bilirubin Urine: NEGATIVE
HGB URINE DIPSTICK: NEGATIVE
Ketones, ur: NEGATIVE
Leukocytes, UA: NEGATIVE
Nitrite: NEGATIVE
Specific Gravity, Urine: 1.02 (ref 1.000–1.030)
Total Protein, Urine: NEGATIVE
URINE GLUCOSE: NEGATIVE
Urobilinogen, UA: 0.2 (ref 0.0–1.0)
pH: 5.5 (ref 5.0–8.0)

## 2018-06-29 LAB — TSH: TSH: 5.44 u[IU]/mL — AB (ref 0.35–4.50)

## 2018-06-29 LAB — LDL CHOLESTEROL, DIRECT: LDL DIRECT: 70 mg/dL

## 2018-06-29 LAB — COMPREHENSIVE METABOLIC PANEL
ALT: 23 U/L (ref 0–35)
AST: 14 U/L (ref 0–37)
Albumin: 4.1 g/dL (ref 3.5–5.2)
Alkaline Phosphatase: 92 U/L (ref 39–117)
BUN: 20 mg/dL (ref 6–23)
CO2: 32 mEq/L (ref 19–32)
CREATININE: 0.97 mg/dL (ref 0.40–1.20)
Calcium: 9.3 mg/dL (ref 8.4–10.5)
Chloride: 102 mEq/L (ref 96–112)
GFR: 55.36 mL/min — ABNORMAL LOW (ref >60.00)
GLUCOSE: 85 mg/dL (ref 70–99)
Potassium: 3.7 mEq/L (ref 3.5–5.1)
SODIUM: 143 meq/L (ref 135–145)
Total Bilirubin: 0.5 mg/dL (ref 0.2–1.2)
Total Protein: 6.5 g/dL (ref 6.0–8.3)

## 2018-06-29 LAB — CBC
HCT: 44.4 % (ref 36.0–46.0)
Hemoglobin: 14.7 g/dL (ref 12.0–15.0)
MCHC: 33.1 g/dL (ref 30.0–36.0)
MCV: 82.1 fl (ref 78.0–100.0)
Platelets: 189 10*3/uL (ref 150.0–400.0)
RBC: 5.41 Mil/uL — ABNORMAL HIGH (ref 3.87–5.11)
RDW: 16.6 % — ABNORMAL HIGH (ref 11.5–15.5)
WBC: 10.9 10*3/uL — ABNORMAL HIGH (ref 4.0–10.5)

## 2018-06-29 LAB — LIPID PANEL
Cholesterol: 143 mg/dL (ref 0–200)
HDL: 34.6 mg/dL — ABNORMAL LOW (ref >39.00)
NonHDL: 108.1
Total CHOL/HDL Ratio: 4
Triglycerides: 278 mg/dL — ABNORMAL HIGH (ref 0.0–149.0)
VLDL: 55.6 mg/dL — ABNORMAL HIGH (ref 0.0–40.0)

## 2018-06-29 LAB — HEMOGLOBIN A1C: HEMOGLOBIN A1C: 6.2 % (ref 4.6–6.5)

## 2018-06-29 LAB — VITAMIN D 25 HYDROXY (VIT D DEFICIENCY, FRACTURES): VITD: 67.44 ng/mL (ref 30.00–100.00)

## 2018-06-29 LAB — URINE CULTURE
MICRO NUMBER:: 141712
SPECIMEN QUALITY:: ADEQUATE

## 2018-06-30 DIAGNOSIS — D485 Neoplasm of uncertain behavior of skin: Secondary | ICD-10-CM | POA: Diagnosis not present

## 2018-06-30 DIAGNOSIS — C44629 Squamous cell carcinoma of skin of left upper limb, including shoulder: Secondary | ICD-10-CM | POA: Diagnosis not present

## 2018-06-30 DIAGNOSIS — R208 Other disturbances of skin sensation: Secondary | ICD-10-CM | POA: Diagnosis not present

## 2018-06-30 DIAGNOSIS — L905 Scar conditions and fibrosis of skin: Secondary | ICD-10-CM | POA: Diagnosis not present

## 2018-07-01 DIAGNOSIS — Z961 Presence of intraocular lens: Secondary | ICD-10-CM | POA: Diagnosis not present

## 2018-07-03 DIAGNOSIS — G4733 Obstructive sleep apnea (adult) (pediatric): Secondary | ICD-10-CM | POA: Diagnosis not present

## 2018-07-04 NOTE — Progress Notes (Signed)
Subjective:    [name], female    DOB: [birth year], 78 y.o.   MRN: [id number]  No chief complaint on file.   HPI Patient is in today for follow up and she is noting worsening cough and congestion.she feels congestion is in her head and her chest at present. No bloody or tarry stool. No change in appetite or nausea or vomiting. Denies CP/palp/SOB/HA/congestion/fevers/GI or GU c/o. Taking meds as prescribed  Past Medical History:  Diagnosis Date  . AAA (abdominal aortic aneurysm) ([place]) 01/2009   AAA (2.8 x 3.0)  moderate RAS (left); 2.7 x 2.7 cm (07/11/05)  . Acute bronchitis 03/20/2013; 2017  . Angina   . Anosmia   . Asthma   . Baker's cyst of knee 01/30/2014  . Basal cell carcinoma    "back and left arm"  . Bradycardia    Metoprolol stopped 08/2011  . CAD (coronary artery disease)    s/CABG (reports IMA and 2 SVGs) back in 1999  Myoview normal 3/10; s/p PCI with DES to PL branch of distal RCA 09/2011; PCI +DES to SVG-RCA, PCI + DES to mid LCx 12/2015  . Cerebrovascular disease 01/2009   carotid u/s  R 0-39%   L 60-79%  . Chronic thoracic back pain   . COPD (chronic obstructive pulmonary disease) ([place])   . Depression   . Dizziness   . Dysrhythmia    hx of sinus brady  . GERD (gastroesophageal reflux disease)   . Heart murmur   . Herniated lumbar disc without myelopathy   . History of blood transfusion 1999   "when I had the bypass; had a PE"  . Hyperglycemia 10/23/2015  . Hyperlipidemia   . Hypertension   . Hypothyroidism 09/30/2016  . Medicare annual wellness visit, subsequent 07/31/2013  . NSTEMI (non-ST elevated myocardial infarction) ([place]) 11/12   Cath showed atretic IMA graft to the LAD, SVG to PD was patent but the continuation of this graft to the PL branch was occluded; there were L to R collaterals; Mid LAD had a 60 to 70% stenosis. She has been treated medically.  Neg Myoview 05/2011  . Osteoarthritis of back   . Osteoporosis   . Pneumonia "several times"   . Pulmonary embolism ([place]) 1999   "after my bypass"  . PVD (peripheral vascular disease) ([place])   . Shortness of breath   . Squamous carcinoma    "nose"  . Thyroid disease    Hypothyroid  . Unstable angina ([place]) 09/25/2015    Past Surgical History:  Procedure Laterality Date  . ABDOMINAL AORTIC ANEURYSM REPAIR     pt denies this hx on 01/02/2016  . BASAL CELL CARCINOMA EXCISION    . CARDIAC CATHETERIZATION N/A 01/02/2016   Procedure: Left Heart Cath and Coronary Angiography;  Surgeon: Wellington Hampshire, MD;  Location: [facility];  Service: Cardiovascular;  Laterality: N/A;  . CARDIAC CATHETERIZATION  1996; 1999  . CARDIAC CATHETERIZATION N/A 06/25/2016   Procedure: Left Heart Cath and Cors/Grafts Angiography;  Surgeon: Wellington Hampshire, MD;  Location: [facility];  Service: Cardiovascular;  Laterality: N/A;  . CAROTID ENDARTERECTOMY Left 01/02/2011  . CATARACT EXTRACTION W/ INTRAOCULAR LENS  IMPLANT, BILATERAL    . CLOSED REDUCTION NASAL FRACTURE  11/2007  . CORONARY ANGIOPLASTY WITH STENT PLACEMENT  10/10/2011   drug eluting  to rc & saphenous  . CORONARY ARTERY BYPASS GRAFT  1999   "CABG X3"  . DILATION AND CURETTAGE OF  UTERUS    . FEMORAL ARTERY STENT Bilateral   . FRACTURE SURGERY    . LEFT HEART CATHETERIZATION WITH CORONARY/GRAFT ANGIOGRAM N/A 04/22/2011   Procedure: LEFT HEART CATHETERIZATION WITH Beatrix Fetters;  Surgeon: Jolaine Artist, MD;  Location[facility];  Service: Cardiovascular;  Laterality: N/A;  . LEFT HEART CATHETERIZATION WITH CORONARY/GRAFT ANGIOGRAM N/A 10/10/2011   Procedure: LEFT HEART CATHETERIZATION WITH Beatrix Fetters;  Surgeon: Peter M Martinique, MD;  Location: [facility];  Service: Cardiovascular;  Laterality: N/A;  . NASAL SINUS SURGERY     twice  . SQUAMOUS CELL CARCINOMA EXCISION    . SVT ABLATION N/A 03/19/2018   Procedure: SVT ABLATION;  Surgeon: Constance Haw, MD;  Location: [facility];  Service:  Cardiovascular;  Laterality: N/A;  . TUBAL LIGATION      Family History  Problem Relation Age of Onset  . Heart attack Mother 45  . Diabetes Mother   . Colon cancer Father 42  . Lung cancer Father        smoked  . Heart disease Father        angina  . Hypertension Father   . Lung cancer Sister        smoked  . Hypothyroidism Sister   . Hypertension Brother   . Heart attack Brother   . Heart disease Brother        PTCA with Stent  . Heart attack Brother        CABG  . Heart disease Brother        CABG with 1 bypass  . Aneurysm Brother        brain  . Alcohol abuse Brother   . Hyperlipidemia Daughter   . Diabetes Maternal Grandmother   . Stroke Maternal Grandmother   . Cirrhosis Maternal Grandfather   . Stroke Paternal Grandmother   . Obesity Daughter   . Obesity Son     Social History   Socioeconomic History  . Marital status: Married    Spouse name: Not on file  . Number of children: 5  . Years of education: Not on file  . Highest education level: Not on file  Occupational History  . 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 Financial resource strain: Not on file  . Food insecurity:    Worry: Not on file    Inability: Not on file  . Transportation needs:    Medical: Not on file    Non-medical: Not on file  Tobacco Use  . Smoking status: Former Smoker    Packs/day: 0.50    Years: 40.00    Pack years: 20.00    Types: Cigarettes    Last attempt to quit: 01/19/1993    Years since quitting: 25.4  . Smokeless tobacco: Never Used  Substance and Sexual Activity  . Alcohol use: Yes    Comment: 01/02/2016 "might drink a glass of wine socially q 3-4 months"  . Drug use: No  . Sexual activity: Not Currently    Birth control/protection: Post-menopausal  Lifestyle  . Physical activity:    Days per week: Not on file    Minutes per session: Not on file  . Stress: Not on file  Relationships  . Social connections:     Talks on phone: Not on file    Gets together: Not on file    Attends religious service: Not on file  Active member of club or organization: Not on file    Attends meetings of clubs or organizations: Not on file    Relationship status: Not on file  . Intimate partner violence:    Fear of current or ex partner: Not on file    Emotionally abused: Not on file    Physically abused: Not on file    Forced sexual activity: Not on file  Other Topics Concern  . Not on file  Social History Narrative   Married with 5 children    Outpatient Medications Prior to Visit  Medication Sig Dispense Refill  . acetaminophen (TYLENOL) 650 MG CR tablet Take 1,300 mg by mouth 2 (two) times daily.    Marland Kitchen albuterol (PROVENTIL HFA;VENTOLIN HFA) 108 (90 Base) MCG/ACT inhaler INHALE 2 PUFFS INTO THE LUNGS EVERY 6 (SIX) HOURS AS NEEDED FOR WHEEZING. (Patient taking differently: Inhale 2 puffs into the lungs every 6 (six) hours as needed for wheezing or shortness of breath. ) 8.5 Inhaler 1  . amLODipine (NORVASC) 5 MG tablet Take 1 tablet (5 mg total) by mouth daily. 90 tablet 3  . Biotin 5 MG CAPS Take 5 mg by mouth daily.    . Calcium Carbonate-Vitamin D (CALCIUM + D PO) Take 1 tablet by mouth 2 (two) times daily.    . Cholecalciferol (VITAMIN D3) 2000 units TABS Take 2,000 Units by mouth 2 (two) times daily.    . clopidogrel (PLAVIX) 75 MG tablet Take 1 tablet (75 mg total) by mouth daily. 90 tablet 3  . diltiazem (CARDIZEM CD) 180 MG 24 hr capsule Take 1 capsule (180 mg total) by mouth daily. 30 capsule 6  . ezetimibe (ZETIA) 10 MG tablet Take 1 tablet (10 mg total) by mouth daily. 90 tablet 3  . famotidine (PEPCID) 40 MG tablet Take 1 tablet (40 mg total) by mouth at bedtime. 30 tablet 3  . fluticasone (FLONASE) 50 MCG/ACT nasal spray Place 2 sprays into both nostrils daily. For congestion (Patient taking differently: Place 2 sprays into both nostrils daily as needed for allergies. ) 48 g 3  . furosemide (LASIX)  20 MG tablet Take 20 mg by mouth every Monday, Wednesday, and Friday.     Marland Kitchen HYDROcodone-acetaminophen (NORCO) 10-325 MG tablet Take 1 tablet by mouth every 6 (six) hours as needed for severe pain.    . isosorbide mononitrate (IMDUR) 60 MG 24 hr tablet TAKE 1 TABLET (60 MG TOTAL) BY MOUTH 2 (TWO) TIMES DAILY. 180 tablet 3  . Krill Oil 1000 MG CAPS Take 1,000 mg by mouth daily.    Marland Kitchen levothyroxine (SYNTHROID, LEVOTHROID) 25 MCG tablet TAKE 1 TABLET (25 MCG TOTAL) BY MOUTH DAILY BEFORE BREAKFAST. 90 tablet 3  . Melatonin 10 MG CAPS Take 10 mg by mouth at bedtime.    . nitroGLYCERIN (NITROSTAT) 0.4 MG SL tablet PLACE 1 TABLET (0.4 MG TOTAL) UNDER THE TONGUE EVERY 5 (FIVE) MINUTES AS NEEDED FOR CHEST PAIN. 25 tablet 0  . omeprazole (PRILOSEC) 20 MG capsule Take 1 capsule (20 mg total) by mouth daily. 30 capsule 3  . Polyethyl Glycol-Propyl Glycol (SYSTANE OP) Place 1 drop into both eyes 3 (three) times daily as needed (for dry eyes).     . potassium chloride (K-DUR) 10 MEQ tablet Take 1 tablet (10 mEq total) by mouth daily as needed. (Patient taking differently: Take 10 mEq by mouth daily as needed (for cramping/lab results.). ) 90 tablet 3  . rosuvastatin (CRESTOR) 40 MG tablet TAKE 1 TABLET (  40 MG TOTAL) BY MOUTH DAILY. 90 tablet 3  . sertraline (ZOLOFT) 50 MG tablet TAKE 1 TABLET BY MOUTH EVERY DAY (Patient taking differently: Take 50 mg by mouth daily. ) 90 tablet 1  . Tiotropium Bromide-Olodaterol (STIOLTO RESPIMAT) 2.5-2.5 MCG/ACT AERS Inhale 2 puffs into the lungs daily. 4 g 10  . Vitamin D, Ergocalciferol, (DRISDOL) 50000 units CAPS capsule TAKE 1 CAPSULE (50,000 UNITS TOTAL) BY MOUTH EVERY 7 (SEVEN) DAYS. (Patient taking differently: Take 50,000 Units by mouth every Monday. ) 12 capsule 1  . zinc gluconate 50 MG tablet Take 50 mg by mouth daily.    Marland Kitchen doxycycline (VIBRA-TABS) 100 MG tablet Take 1 tablet (100 mg total) by mouth 2 (two) times daily. 14 tablet 0  . azelastine (ASTELIN) 0.1 % nasal  spray Place 2 sprays into both nostrils at bedtime as needed for rhinitis. 30 mL 3   No facility-administered medications prior to visit.     Allergies  Allergen Reactions  . Codeine Nausea And Vomiting  . Erythromycin Other (See Comments)    Tongue burns   . Meperidine Nausea And Vomiting  . Shellfish Allergy Nausea And Vomiting  . Ciprofloxacin Rash and Other (See Comments)    Rash IV  . Penicillins Rash and Other (See Comments)    Has patient had a PCN reaction causing immediate rash, facial/tongue/throat swelling, SOB or lightheadedness with hypotension:unsure Has patient had a PCN reaction causing severe rash involving mucus membranes or skin necrosis:No Has patient had a PCN reaction that required hospitalization:No Has patient had a PCN reaction occurring within the last 10 years:No If all of the above answers are "NO", then may proceed with Cephalosporin use.   . Tape Rash    ROS     Objective:    Physical Exam  BP 118/71 (BP Location: Left Arm, Patient Position: Sitting, Cuff Size: Normal)   Pulse 65   Temp 98 F (36.7 C) (Oral)   Resp 18   Ht 5\' 3"  (1.6 m)   Wt 142 lb 13.9 oz (64.8 kg)   SpO2 93%   BMI 25.31 kg/m  Wt Readings from Last 3 Encounters:  06/28/18 142 lb 13.9 oz (64.8 kg)  06/22/18 142 lb 9.6 oz (64.7 kg)  05/24/18 142 lb 4 oz (64.5 kg)     Lab Results  Component Value Date   WBC 10.9 (H) 06/28/2018   HGB 14.7 06/28/2018   HCT 44.4 06/28/2018   PLT 189.0 06/28/2018   GLUCOSE 85 06/28/2018   CHOL 143 06/28/2018   TRIG 278.0 (H) 06/28/2018   HDL 34.60 (L) 06/28/2018   LDLDIRECT 70.0 06/28/2018   LDLCALC 78 12/01/2017   ALT 23 06/28/2018   AST 14 06/28/2018   NA 143 06/28/2018   K 3.7 06/28/2018   CL 102 06/28/2018   CREATININE 0.97 06/28/2018   BUN 20 06/28/2018   CO2 32 06/28/2018   TSH 5.44 (H) 06/28/2018   INR 1.0 06/17/2016   HGBA1C 6.2 06/28/2018   MICROALBUR 6.9 (H) 12/01/2017    Lab Results  Component Value Date    TSH 5.44 (H) 06/28/2018   Lab Results  Component Value Date   WBC 10.9 (H) 06/28/2018   HGB 14.7 06/28/2018   HCT 44.4 06/28/2018   MCV 82.1 06/28/2018   PLT 189.0 06/28/2018   Lab Results  Component Value Date   NA 143 06/28/2018   K 3.7 06/28/2018   CO2 32 06/28/2018   GLUCOSE 85 06/28/2018   BUN 20  06/28/2018   CREATININE 0.97 06/28/2018   BILITOT 0.5 06/28/2018   ALKPHOS 92 06/28/2018   AST 14 06/28/2018   ALT 23 06/28/2018   PROT 6.5 06/28/2018   ALBUMIN 4.1 06/28/2018   CALCIUM 9.3 06/28/2018   ANIONGAP 8 01/11/2018   GFR 55.36 (L) 06/28/2018   Lab Results  Component Value Date   CHOL 143 06/28/2018   Lab Results  Component Value Date   HDL 34.60 (L) 06/28/2018   Lab Results  Component Value Date   LDLCALC 78 12/01/2017   Lab Results  Component Value Date   TRIG 278.0 (H) 06/28/2018   Lab Results  Component Value Date   CHOLHDL 4 06/28/2018   Lab Results  Component Value Date   HGBA1C 6.2 06/28/2018       Assessment & Plan:   Problem List Items Addressed This Visit    Hyperlipidemia    Encouraged heart healthy diet, increase exercise, avoid trans fats, consider a krill oil cap daily      Relevant Orders   Lipid panel (Completed)   HTN (hypertension)    Well controlled, no changes to meds. Encouraged heart healthy diet such as the DASH diet and exercise as tolerated.       Relevant Orders   CBC (Completed)   Comprehensive metabolic panel (Completed)   TSH (Completed)   Osteoporosis    Encouraged to get adequate exercise, calcium and vitamin d intake      Cough - Primary    Likely bronchitis, check CXR, started on antibiotic mucinex and cough syrup report worsening symptoms      Relevant Medications   HYDROcodone-homatropine (HYCODAN) 5-1.5 MG/5ML syrup   Other Relevant Orders   DG Chest 2 View (Completed)   Hyperglycemia    hgba1c acceptable, minimize simple carbs. Increase exercise as tolerated.       Relevant Orders    Hemoglobin A1c (Completed)   Hypothyroidism     continue to monitor       Other Visit Diagnoses    Urinary frequency       Relevant Orders   Urinalysis (Completed)   Urine Culture (Completed)   Vitamin D deficiency       Relevant Orders   VITAMIN D 25 Hydroxy (Vit-D Deficiency, Fractures) (Completed)      I have discontinued Arbie Cookey Ridley's doxycycline. I am also having her start on sulfamethoxazole-trimethoprim, HYDROcodone-homatropine, and methylPREDNISolone. Additionally, I am having her maintain her Calcium Carbonate-Vitamin D (CALCIUM + D PO), acetaminophen, Polyethyl Glycol-Propyl Glycol (SYSTANE OP), Krill Oil, potassium chloride, fluticasone, levothyroxine, rosuvastatin, furosemide, sertraline, albuterol, nitroGLYCERIN, Vitamin D (Ergocalciferol), famotidine, omeprazole, HYDROcodone-acetaminophen, Vitamin D3, Melatonin, zinc gluconate, Biotin, clopidogrel, ezetimibe, Tiotropium Bromide-Olodaterol, isosorbide mononitrate, amLODipine, diltiazem, and azelastine.  Meds ordered this encounter  Medications  . azelastine (ASTELIN) 0.1 % nasal spray    Sig: Place 2 sprays into both nostrils at bedtime as needed for rhinitis.    Dispense:  30 mL    Refill:  3  . sulfamethoxazole-trimethoprim (BACTRIM DS,SEPTRA DS) 800-160 MG tablet    Sig: Take 1 tablet by mouth 2 (two) times daily.    Dispense:  20 tablet    Refill:  0  . HYDROcodone-homatropine (HYCODAN) 5-1.5 MG/5ML syrup    Sig: Take 5 mLs by mouth every 8 (eight) hours as needed for cough.    Dispense:  150 mL    Refill:  0  . methylPREDNISolone (MEDROL) 4 MG tablet    Sig: 5 tab po qd X 1d then  4 tab po qd X 1d then 3 tab po qd X 1d then 2 tab po qd then 1 tab po qd    Dispense:  15 tablet    Refill:  0     [name], MD

## 2018-07-04 NOTE — Assessment & Plan Note (Signed)
Encouraged to get adequate exercise, calcium and vitamin d intake 

## 2018-07-04 NOTE — Assessment & Plan Note (Signed)
Likely bronchitis, check CXR, started on antibiotic mucinex and cough syrup report worsening symptoms

## 2018-07-04 NOTE — Assessment & Plan Note (Signed)
Encouraged heart healthy diet, increase exercise, avoid trans fats, consider a krill oil cap daily 

## 2018-07-04 NOTE — Assessment & Plan Note (Addendum)
continue to monitor

## 2018-07-04 NOTE — Assessment & Plan Note (Signed)
hgba1c acceptable, minimize simple carbs. Increase exercise as tolerated.  

## 2018-07-04 NOTE — Assessment & Plan Note (Signed)
Well controlled, no changes to meds. Encouraged heart healthy diet such as the DASH diet and exercise as tolerated.  °

## 2018-07-06 ENCOUNTER — Other Ambulatory Visit: Payer: Self-pay | Admitting: Family Medicine

## 2018-07-06 MED ORDER — LEVOTHYROXINE SODIUM 50 MCG PO TABS: 50.0000 ug | ORAL_TABLET | Freq: Every day | ORAL | 3 refills | Status: DC

## 2018-07-09 ENCOUNTER — Telehealth: Payer: Self-pay | Admitting: Pulmonary Disease

## 2018-07-09 NOTE — Telephone Encounter (Signed)
Called and spoke with [place] at [facility]. She stated that she would reach out to the patient today and schedule ONO on CPAP on RA.  Nothing else needed at this time. Rhonda J Cobb

## 2018-07-09 NOTE — Telephone Encounter (Signed)
Pt needs ONO on cpap/room air DME: [facility] Order placed 05/18/18

## 2018-07-14 ENCOUNTER — Encounter: Payer: Self-pay | Admitting: *Deleted

## 2018-07-15 ENCOUNTER — Telehealth: Payer: Self-pay

## 2018-07-15 NOTE — Telephone Encounter (Signed)
PA request received from [employer] for Stiolto respimat 2.5 mcg/act. It has been sent to plan for determination.

## 2018-07-16 NOTE — Telephone Encounter (Signed)
Stiolto respimat inhalation spray has been approved through 05/26/2019.

## 2018-07-20 ENCOUNTER — Ambulatory Visit: Payer: PPO | Admitting: Cardiology

## 2018-07-20 DIAGNOSIS — H1045 Other chronic allergic conjunctivitis: Secondary | ICD-10-CM | POA: Diagnosis not present

## 2018-07-26 ENCOUNTER — Telehealth: Payer: Self-pay | Admitting: Pulmonary Disease

## 2018-07-26 DIAGNOSIS — J449 Chronic obstructive pulmonary disease, unspecified: Secondary | ICD-10-CM

## 2018-07-26 NOTE — Telephone Encounter (Signed)
Per ONO on cpap and roomair performed 07/14/18, pt does not need nocturnal oxygen. Pt is requesting that order be placed to [facility] to d/c nighttime oxygen. Order has been placed to [facility] to d/c oxygen per ONO results.  Nothing further is needed.

## 2018-07-28 DIAGNOSIS — J449 Chronic obstructive pulmonary disease, unspecified: Secondary | ICD-10-CM | POA: Diagnosis not present

## 2018-07-28 DIAGNOSIS — I270 Primary pulmonary hypertension: Secondary | ICD-10-CM | POA: Diagnosis not present

## 2018-07-28 DIAGNOSIS — C44212 Basal cell carcinoma of skin of right ear and external auricular canal: Secondary | ICD-10-CM | POA: Diagnosis not present

## 2018-07-29 DIAGNOSIS — G4733 Obstructive sleep apnea (adult) (pediatric): Secondary | ICD-10-CM | POA: Diagnosis not present

## 2018-08-01 DIAGNOSIS — G4733 Obstructive sleep apnea (adult) (pediatric): Secondary | ICD-10-CM | POA: Diagnosis not present

## 2018-08-06 ENCOUNTER — Other Ambulatory Visit: Payer: Self-pay | Admitting: Family Medicine

## 2018-08-11 ENCOUNTER — Ambulatory Visit: Payer: PPO | Admitting: Pulmonary Disease

## 2018-08-16 ENCOUNTER — Telehealth: Payer: Self-pay

## 2018-08-16 NOTE — Telephone Encounter (Signed)
Called and spoke to patient regarding f/u apt. She stated nothing is urgent at this time and would rather r/s. Moved apt to June. Patient aware to call if the need arises. She is aware. Nothing further needed at this time.

## 2018-08-19 ENCOUNTER — Ambulatory Visit: Payer: PPO | Admitting: Pulmonary Disease

## 2018-09-01 ENCOUNTER — Other Ambulatory Visit: Payer: Self-pay | Admitting: Family Medicine

## 2018-09-01 ENCOUNTER — Other Ambulatory Visit: Payer: Self-pay | Admitting: Cardiovascular Disease

## 2018-09-01 DIAGNOSIS — G4733 Obstructive sleep apnea (adult) (pediatric): Secondary | ICD-10-CM | POA: Diagnosis not present

## 2018-09-02 DIAGNOSIS — G4733 Obstructive sleep apnea (adult) (pediatric): Secondary | ICD-10-CM | POA: Diagnosis not present

## 2018-09-14 ENCOUNTER — Other Ambulatory Visit: Payer: Self-pay | Admitting: Family Medicine

## 2018-10-01 DIAGNOSIS — G4733 Obstructive sleep apnea (adult) (pediatric): Secondary | ICD-10-CM | POA: Diagnosis not present

## 2018-10-04 DIAGNOSIS — G4733 Obstructive sleep apnea (adult) (pediatric): Secondary | ICD-10-CM | POA: Diagnosis not present

## 2018-10-14 ENCOUNTER — Telehealth: Payer: Self-pay | Admitting: *Deleted

## 2018-10-14 NOTE — Telephone Encounter (Signed)
Calling patient today to discuss  upcoming appointment.  We are currently trying to limit exposure to the virus that causes COVID-19 by seeing patients at home rather than in the office. We would like to schedule this appointment as a Psychologist, counselling. Patient is aware if they decide to reschedule this appointment, they may not be seen or scheduled for the next 4-6 months. Patient at this time declines Virtual Visits. Patient prefers face to face visits, these are easier for her to understand. Patient did not want to reschedule appointment at this time.  Message sent scheduling and nurse.   Concerns and/or Complaints:                    Since your last visit or hospitalization:   1. Have you been having new or worsening chest pain or chest discomfort? NO 2. Have you been having new or worsening shortness of breath? COPD 3. Have you been having new or worsening leg swelling, wt gain, or increase in abdominal girth (pants fitting more tightly)? NO 4. Have you had any passing out spells, dizziness, or uncommon headaches? NO 5. Have you had any extreme tiredness or fatigue? NO 6. Have you had any problems with your device or device site (swelling, fever, tenderness, redness, vibrations, beeping)? NO

## 2018-10-19 ENCOUNTER — Ambulatory Visit: Payer: PPO | Admitting: Cardiology

## 2018-10-28 ENCOUNTER — Encounter: Payer: PPO | Admitting: Family Medicine

## 2018-11-01 DIAGNOSIS — G4733 Obstructive sleep apnea (adult) (pediatric): Secondary | ICD-10-CM | POA: Diagnosis not present

## 2018-11-07 ENCOUNTER — Encounter (HOSPITAL_COMMUNITY): Payer: Self-pay | Admitting: Emergency Medicine

## 2018-11-07 ENCOUNTER — Emergency Department (HOSPITAL_COMMUNITY)
Admission: EM | Admit: 2018-11-07 | Discharge: 2018-11-07 | Disposition: A | Discharge status: Home / Self Care | Payer: PPO | Attending: Emergency Medicine | Admitting: Emergency Medicine

## 2018-11-07 ENCOUNTER — Other Ambulatory Visit: Payer: Self-pay

## 2018-11-07 ENCOUNTER — Telehealth: Payer: Self-pay | Admitting: Student

## 2018-11-07 ENCOUNTER — Emergency Department (HOSPITAL_COMMUNITY): Payer: PPO

## 2018-11-07 DIAGNOSIS — Z79899 Other long term (current) drug therapy: Secondary | ICD-10-CM | POA: Diagnosis not present

## 2018-11-07 DIAGNOSIS — I4891 Unspecified atrial fibrillation: Secondary | ICD-10-CM | POA: Insufficient documentation

## 2018-11-07 DIAGNOSIS — J439 Emphysema, unspecified: Secondary | ICD-10-CM | POA: Diagnosis not present

## 2018-11-07 DIAGNOSIS — J45909 Unspecified asthma, uncomplicated: Secondary | ICD-10-CM | POA: Diagnosis not present

## 2018-11-07 DIAGNOSIS — I2510 Atherosclerotic heart disease of native coronary artery without angina pectoris: Secondary | ICD-10-CM | POA: Diagnosis not present

## 2018-11-07 DIAGNOSIS — R002 Palpitations: Secondary | ICD-10-CM | POA: Diagnosis present

## 2018-11-07 DIAGNOSIS — Z951 Presence of aortocoronary bypass graft: Secondary | ICD-10-CM | POA: Diagnosis not present

## 2018-11-07 DIAGNOSIS — I10 Essential (primary) hypertension: Secondary | ICD-10-CM | POA: Insufficient documentation

## 2018-11-07 DIAGNOSIS — Z87891 Personal history of nicotine dependence: Secondary | ICD-10-CM | POA: Diagnosis not present

## 2018-11-07 DIAGNOSIS — I700 Atherosclerosis of aorta: Secondary | ICD-10-CM | POA: Diagnosis not present

## 2018-11-07 DIAGNOSIS — E039 Hypothyroidism, unspecified: Secondary | ICD-10-CM | POA: Diagnosis not present

## 2018-11-07 DIAGNOSIS — Z955 Presence of coronary angioplasty implant and graft: Secondary | ICD-10-CM | POA: Diagnosis not present

## 2018-11-07 LAB — BASIC METABOLIC PANEL
Anion gap: 8 (ref 5–15)
BUN: 14 mg/dL (ref 8–23)
CO2: 25 mmol/L (ref 22–32)
Calcium: 9.5 mg/dL (ref 8.9–10.3)
Chloride: 108 mmol/L (ref 98–111)
Creatinine, Ser: 0.98 mg/dL (ref 0.44–1.00)
GFR calc Af Amer: >60 mL/min (ref >60)
GFR calc non Af Amer: 55 mL/min — ABNORMAL LOW (ref >60)
Glucose, Bld: 101 mg/dL — ABNORMAL HIGH (ref 70–99)
Potassium: 3.7 mmol/L (ref 3.5–5.1)
Sodium: 141 mmol/L (ref 135–145)

## 2018-11-07 LAB — CBC WITH DIFFERENTIAL/PLATELET
Abs Immature Granulocytes: 0.01 10*3/uL (ref 0.00–0.07)
Basophils Absolute: 0 10*3/uL (ref 0.0–0.1)
Basophils Relative: 0 %
Eosinophils Absolute: 0.1 10*3/uL (ref 0.0–0.5)
Eosinophils Relative: 2 %
HCT: 45.8 % (ref 36.0–46.0)
Hemoglobin: 14.6 g/dL (ref 12.0–15.0)
Immature Granulocytes: 0 %
Lymphocytes Relative: 29 %
Lymphs Abs: 2.1 10*3/uL (ref 0.7–4.0)
MCH: 26.8 pg (ref 26.0–34.0)
MCHC: 31.9 g/dL (ref 30.0–36.0)
MCV: 84 fL (ref 80.0–100.0)
Monocytes Absolute: 0.4 10*3/uL (ref 0.1–1.0)
Monocytes Relative: 5 %
Neutro Abs: 4.6 10*3/uL (ref 1.7–7.7)
Neutrophils Relative %: 64 %
Platelets: 195 10*3/uL (ref 150–400)
RBC: 5.45 MIL/uL — ABNORMAL HIGH (ref 3.87–5.11)
RDW: 14.6 % (ref 11.5–15.5)
WBC: 7.1 10*3/uL (ref 4.0–10.5)
nRBC: 0 % (ref 0.0–0.2)

## 2018-11-07 LAB — TROPONIN I
Troponin I: <0.03 ng/mL (ref <0.03)
Troponin I: <0.03 ng/mL (ref <0.03)

## 2018-11-07 MED ORDER — RIVAROXABAN 15 MG PO TABS: 15.0000 mg | ORAL_TABLET | Freq: Every day | ORAL | 0 refills | Status: DC

## 2018-11-07 MED ORDER — RIVAROXABAN 15 MG PO TABS
15.0000 mg | ORAL_TABLET | Freq: Once | ORAL | Status: AC
  Administered 2018-11-07: 15 mg via ORAL
  Filled 2018-11-07: qty 1

## 2018-11-07 NOTE — ED Triage Notes (Signed)
Patient arrived from home via POV. Complaints of AFIB at home for last 3 days ( recorded from [facility]), felt chest heaviness and shortness and breath at that time. Reports new onset of heartburn last night unrelieved with over the counter medications. Denies current chest pain or shortness of breath. Denies hiccups or hoarness of voices. No current pain.

## 2018-11-07 NOTE — ED Provider Notes (Addendum)
Patient is a [facility] Provider Note   CSN: [id number] Arrival date & time: 11/07/18  1217     History   Chief Complaint Chief Complaint  Patient presents with  . Irregular Heart Beat    HPI Andrea [name] is a 78 y.o. female.     Patient is a 78 year old female with a history of atrial tachycardia who presents with palpitations.  She has had palpitations for the last 3 days.  She is not on anticoagulants.  She says she felt like her heart was beating faster at times but she does not really know how fast.  She got notifications from her Apple Watch that she was in atrial fibrillation.  This morning she had some severe burning in the middle of her chest which she describes as a heartburn.  It started last night and was little worse this morning.  She had some associated shortness of breath.  She denies any current symptoms.  No leg swelling.  No cough or cold symptoms.  No fevers.     Past Medical History:  Diagnosis Date  . AAA (abdominal aortic aneurysm) ([place]) 01/2009   AAA (2.8 x 3.0)  moderate RAS (left); 2.7 x 2.7 cm (07/11/05)  . Acute bronchitis 03/20/2013; 2017  . Angina   . Anosmia   . Asthma   . Baker's cyst of knee 01/30/2014  . Basal cell carcinoma    "back and left arm"  . Bradycardia    Metoprolol stopped 08/2011  . CAD (coronary artery disease)    s/CABG (reports IMA and 2 SVGs) back in 1999  Myoview normal 3/10; s/p PCI with DES to PL branch of distal RCA 09/2011; PCI +DES to SVG-RCA, PCI + DES to mid LCx 12/2015  . Cerebrovascular disease 01/2009   carotid u/s  R 0-39%   L 60-79%  . Chronic thoracic back pain   . COPD (chronic obstructive pulmonary disease) ([place])   . Depression   . Dizziness   . Dysrhythmia    hx of sinus brady  . GERD (gastroesophageal reflux disease)   . Heart murmur   . Herniated lumbar disc without myelopathy   . History of blood transfusion 1999   "when I had the bypass; had a PE"  .  Hyperglycemia 10/23/2015  . Hyperlipidemia   . Hypertension   . Hypothyroidism 09/30/2016  . Medicare annual wellness visit, subsequent 07/31/2013  . NSTEMI (non-ST elevated myocardial infarction) ([place]) 11/12   Cath showed atretic IMA graft to the LAD, SVG to PD was patent but the continuation of this graft to the PL branch was occluded; there were L to R collaterals; Mid LAD had a 60 to 70% stenosis. She has been treated medically.  Neg Myoview 05/2011  . Osteoarthritis of back   . Osteoporosis   . Pneumonia "several times"  . Pulmonary embolism ([place]) 1999   "after my bypass"  . PVD (peripheral vascular disease) ([place])   . Shortness of breath   . Squamous carcinoma    "nose"  . Thyroid disease    Hypothyroid  . Unstable angina ([place]) 09/25/2015    Patient Active Problem List   Diagnosis Date Noted  . Witnessed episode of apnea 03/07/2018  . Cervical cancer screening 12/09/2017  . Annual physical exam 12/09/2017  . Chronic ethmoidal sinusitis 09/04/2017  . Hypothyroidism 09/30/2016  . Presbycusis of both ears 08/05/2016  . 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 TMJ pain dysfunction syndrome  08/05/2016  . Cramps, extremity 07/03/2016  . 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 Flank pain 07/03/2016  . Stable angina ([place]) 06/17/2016  . Hx of CABG 12/27/2015  . Hyperglycemia 10/23/2015  . Unstable angina ([place]) 09/25/2015  . Upper airway cough syndrome 03/23/2015  . Hypokalemia 07/24/2014  . Cystitis 04/11/2014  . Baker's cyst of knee 01/30/2014  . Thoracic back pain 01/24/2014  . Breast cancer screening 07/31/2013  . Shortness of breath 05/09/2013  . Anosmia   . Fatigue 09/17/2011  . CAD (coronary artery disease) 06/06/2011  . NSTEMI (non-ST elevated myocardial infarction) ([place]) 04/22/2011  . Excessive somnolence disorder 12/03/2010  . BACK PAIN, LUMBAR 07/16/2010  . SINUSITIS, CHRONIC 05/07/2010  . Cough 09/10/2009  . SKIN CANCER, HX OF 04/03/2009  . Coronary artery  disease involving coronary bypass graft of native heart with angina pectoris ([place]) 01/24/2009  . Carotid artery stenosis 01/24/2009  . Atherosclerosis of renal artery ([place]) 01/24/2009  . 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  . AORTIC ANEUR [place] WITHOUT MENTION RUPTURE 01/24/2009  . POST-OP CARDIAC COMPLICATION 13/24/4010  . Hyperlipidemia 12/22/2006  . DEPRESSION 12/22/2006  . HTN (hypertension) 12/22/2006  . PERIPHERAL VASCULAR DISEASE 12/22/2006  . COPD GOLD II 12/22/2006  . GERD 12/22/2006  . Osteoporosis 12/22/2006    Past Surgical History:  Procedure Laterality Date  . ABDOMINAL AORTIC ANEURYSM REPAIR     pt denies this hx on 01/02/2016  . BASAL CELL CARCINOMA EXCISION    . CARDIAC CATHETERIZATION N/A 01/02/2016   Procedure: Left Heart Cath and Coronary Angiography;  Surgeon: Wellington Hampshire, MD;  Location: [facility];  Service: Cardiovascular;  Laterality: N/A;  . CARDIAC CATHETERIZATION  1996; 1999  . CARDIAC CATHETERIZATION N/A 06/25/2016   Procedure: Left Heart Cath and Cors/Grafts Angiography;  Surgeon: Wellington Hampshire, MD;  Location: [facility];  Service: Cardiovascular;  Laterality: N/A;  . CAROTID ENDARTERECTOMY Left 01/02/2011  . CATARACT EXTRACTION W/ INTRAOCULAR LENS  IMPLANT, BILATERAL    . CLOSED REDUCTION NASAL FRACTURE  11/2007  . CORONARY ANGIOPLASTY WITH STENT PLACEMENT  10/10/2011   drug eluting  to rc & saphenous  . CORONARY ARTERY BYPASS GRAFT  1999   "CABG X3"  . DILATION AND CURETTAGE OF UTERUS    . FEMORAL ARTERY STENT Bilateral   . FRACTURE SURGERY    . LEFT HEART CATHETERIZATION WITH CORONARY/GRAFT ANGIOGRAM N/A 04/22/2011   Procedure: LEFT HEART CATHETERIZATION WITH Beatrix Fetters;  Surgeon: Jolaine Artist, MD;  Location: [facility];  Service: Cardiovascular;  Laterality: N/A;  . LEFT HEART CATHETERIZATION WITH CORONARY/GRAFT ANGIOGRAM N/A 10/10/2011   Procedure: LEFT HEART CATHETERIZATION WITH Beatrix Fetters;  Surgeon: Peter M Martinique, MD;  Location: [facility] CATH LAB;  Service: Cardiovascular;  Laterality: N/A;  . NASAL SINUS SURGERY     twice  . SQUAMOUS CELL CARCINOMA EXCISION    . SVT ABLATION N/A 03/19/2018   Procedure: SVT ABLATION;  Surgeon: Constance Haw, MD;  Location: [facility];  Service: Cardiovascular;  Laterality: N/A;  . TUBAL LIGATION       OB History    Gravida  6   Para  5   Term  3   Preterm  2   AB  1   Living  5     SAB  1   TAB      Ectopic      Multiple      Live Births  5  Home Medications    Prior to Admission medications   Medication Sig Start Date End Date Taking? Authorizing Provider  acetaminophen (TYLENOL) 650 MG CR tablet Take 1,300 mg by mouth 2 (two) times daily.   Yes [provider]  albuterol (PROVENTIL HFA;VENTOLIN HFA) 108 (90 Base) MCG/ACT inhaler INHALE 2 PUFFS INTO THE LUNGS EVERY 6 (SIX) HOURS AS NEEDED FOR WHEEZING. Patient taking differently: Inhale 2 puffs into the lungs every 6 (six) hours as needed for wheezing or shortness of breath.  02/01/18  Yes Kasa, Maretta Bees, MD  amLODipine (NORVASC) 5 MG tablet Take 1 tablet (5 mg total) by mouth daily. 06/01/18  Yes Gollan, Kathlene November, MD  azelastine (ASTELIN) 0.1 % nasal spray Place 2 sprays into both nostrils at bedtime as needed for rhinitis. 06/28/18  Yes Mosie Lukes, MD  Biotin 5 MG TABS Take 5 mg by mouth daily.    Yes [provider]  Calcium Carbonate-Vitamin D (CALCIUM + D PO) Take 1 tablet by mouth daily.    Yes [provider]  Cholecalciferol (VITAMIN D3) 2000 units TABS Take 2,000 Units by mouth daily.    Yes [provider]  clopidogrel (PLAVIX) 75 MG tablet Take 1 tablet (75 mg total) by mouth daily. 04/07/18  Yes Minna Merritts, MD  diltiazem (CARDIZEM CD) 180 MG 24 hr capsule Take 1 capsule (180 mg total) by mouth daily. 06/18/18 12/24/18 Yes Camnitz, Will Hassell Done, MD  ezetimibe (ZETIA) 10 MG tablet Take 1  tablet (10 mg total) by mouth daily. Patient taking differently: Take 10 mg by mouth at bedtime.  04/07/18 12/24/19 Yes Gollan, Kathlene November, MD  famotidine (PEPCID) 20 MG tablet TAKE 2 TABLETS (40MG  TOTAL) BY MOUTH EVERYDAY AT BEDTIME Patient taking differently: Take 40 mg by mouth at bedtime.  08/09/18  Yes Mosie Lukes, MD  fluticasone (FLONASE) 50 MCG/ACT nasal spray Place 2 sprays into both nostrils daily. For congestion Patient taking differently: Place 2 sprays into both nostrils daily as needed for allergies.  08/08/16  Yes Mosie Lukes, MD  furosemide (LASIX) 20 MG tablet Take 20 mg by mouth every Monday, Wednesday, and Friday.    Yes [provider]  HYDROcodone-acetaminophen (NORCO) 10-325 MG tablet Take 1 tablet by mouth every 6 (six) hours as needed for severe pain.   Yes [provider]  isosorbide mononitrate (IMDUR) 60 MG 24 hr tablet TAKE 1 TABLET (60 MG TOTAL) BY MOUTH 2 (TWO) TIMES DAILY. 05/11/18  Yes Minna Merritts, MD  Krill Oil 500 MG CAPS Take 500 mg by mouth daily.    Yes [provider]  levothyroxine (SYNTHROID, LEVOTHROID) 50 MCG tablet Take 1 tablet (50 mcg total) by mouth daily. Patient taking differently: Take 50 mcg by mouth daily before breakfast.  07/06/18  Yes Mosie Lukes, MD  nitroGLYCERIN (NITROSTAT) 0.4 MG SL tablet PLACE 1 TABLET (0.4 MG TOTAL) UNDER THE TONGUE EVERY 5 (FIVE) MINUTES AS NEEDED FOR CHEST PAIN. 02/01/18  Yes Gollan, Kathlene November, MD  omeprazole (PRILOSEC) 20 MG capsule TAKE 1 CAPSULE BY MOUTH ONCE DAILY Patient taking differently: Take 20 mg by mouth daily.  07/07/18  Yes Mosie Lukes, MD  Polyethyl Glycol-Propyl Glycol (SYSTANE OP) Place 1 drop into both eyes 2 (two) times a day.    Yes [provider]  rosuvastatin (CRESTOR) 40 MG tablet TAKE 1 TABLET BY MOUTH ONCE A DAY Patient taking differently: Take 40 mg by mouth at bedtime.  09/01/18  Yes Gollan,  Kathlene November, MD  sertraline (ZOLOFT) 50 MG tablet TAKE 1  TABLET BY MOUTH ONCE A DAY Patient taking differently: Take 50 mg by mouth at bedtime.  09/02/18  Yes Mosie Lukes, MD  Tiotropium Bromide-Olodaterol (STIOLTO RESPIMAT) 2.5-2.5 MCG/ACT AERS Inhale 2 puffs into the lungs daily. 04/19/18  Yes Wilhelmina Mcardle, MD  Vitamin D, Ergocalciferol, (DRISDOL) 1.25 MG (50000 UT) CAPS capsule TAKE 1 CAPSULE BY MOUTH EVERY 7 DAYS Patient taking differently: Take 50,000 Units by mouth every Monday.  09/14/18  Yes Mosie Lukes, MD  zinc gluconate 50 MG tablet Take 50 mg by mouth daily.   Yes [provider]  famotidine (PEPCID) 40 MG tablet Take 1 tablet (40 mg total) by mouth at bedtime. Patient not taking: Reported on 11/07/2018 03/02/18   Mosie Lukes, MD  HYDROcodone-homatropine [facility]) 5-1.5 MG/5ML syrup Take 5 mLs by mouth every 8 (eight) hours as needed for cough. Patient not taking: Reported on 11/07/2018 06/28/18   Mosie Lukes, MD  potassium chloride (K-DUR) 10 MEQ tablet Take 1 tablet (10 mEq total) by mouth daily as needed. Patient not taking: Reported on 11/07/2018 07/24/14   Minna Merritts, MD  Rivaroxaban (XARELTO) 15 MG TABS tablet Take 1 tablet (15 mg total) by mouth daily with supper. 11/07/18   Malvin Johns, MD    Family History Family History  Problem Relation Age of Onset  . Heart attack Mother 43  . Diabetes Mother   . Colon cancer Father 50  . Lung cancer Father        smoked  . Heart disease Father   . Hypertension Father   . Angina Father   . Lung cancer Sister        smoked  . Hypothyroidism Sister   . Hypertension Brother   . Heart attack Brother   . Heart disease Brother        PTCA with Stent  . Heart attack Brother        CABG  . Heart disease Brother        CABG with 1 bypass  . Aneurysm Brother        brain  . Alcohol abuse Brother   . Hyperlipidemia Daughter   . Diabetes Maternal Grandmother   . Stroke Maternal Grandmother   . Cirrhosis Maternal Grandfather   . Stroke Paternal Grandmother    . Obesity Daughter   . Obesity Son     Social History Social History   Tobacco Use  . Smoking status: Former Smoker    Packs/day: 0.50    Years: 40.00    Pack years: 20.00    Types: Cigarettes    Quit date: 01/19/1993    Years since quitting: 25.8  . Smokeless tobacco: Never Used  Substance Use Topics  . Alcohol use: Yes    Comment: 01/02/2016 "might drink a glass of wine socially q 3-4 months"  . Drug use: No     Allergies   Erythromycin, Meperidine, Shellfish allergy, Ciprofloxacin, Codeine, Penicillins, and Tape   Review of Systems Review of Systems  Constitutional: Negative for chills, diaphoresis, fatigue and fever.  HENT: Negative for congestion, rhinorrhea and sneezing.   Eyes: Negative.   Respiratory: Positive for shortness of breath. Negative for cough and chest tightness.   Cardiovascular: Positive for chest pain and palpitations. Negative for leg swelling.  Gastrointestinal: Negative for abdominal pain, blood in stool, diarrhea, nausea and vomiting.  Genitourinary: Negative for difficulty urinating, flank pain, frequency and  hematuria.  Musculoskeletal: Negative for arthralgias and back pain.  Skin: Negative for rash.  Neurological: Negative for dizziness, speech difficulty, weakness, numbness and headaches.     Physical Exam Updated Vital Signs BP (!) 119/96   Pulse 74   Temp 98.5 F (36.9 C) (Oral)   Resp 18   Ht 5\' 2"  (1.575 m)   Wt 65.8 kg   SpO2 97%   BMI 26.52 kg/m   Physical Exam Constitutional:      Appearance: She is well-developed.  HENT:     Head: Normocephalic and atraumatic.  Eyes:     Pupils: Pupils are equal, round, and reactive to light.  Neck:     Musculoskeletal: Normal range of motion and neck supple.  Cardiovascular:     Rate and Rhythm: Normal rate. Rhythm irregular.     Heart sounds: Normal heart sounds.  Pulmonary:     Effort: Pulmonary effort is normal. No respiratory distress.     Breath sounds: Normal breath  sounds. No wheezing or rales.  Chest:     Chest wall: No tenderness.  Abdominal:     General: Bowel sounds are normal.     Palpations: Abdomen is soft.     Tenderness: There is no abdominal tenderness. There is no guarding or rebound.  Musculoskeletal: Normal range of motion.     Right lower leg: No edema.     Left lower leg: No edema.  Lymphadenopathy:     Cervical: No cervical adenopathy.  Skin:    General: Skin is warm and dry.     Findings: No rash.  Neurological:     Mental Status: She is alert and oriented to person, place, and time.      ED Treatments / Results  Labs (all labs ordered are listed, but only abnormal results are displayed) Labs Reviewed  BASIC METABOLIC PANEL - Abnormal; Notable for the following components:      Result Value   Glucose, Bld 101 (*)    GFR calc non Af Amer 55 (*)    All other components within normal limits  CBC WITH DIFFERENTIAL/PLATELET - Abnormal; Notable for the following components:   RBC 5.45 (*)    All other components within normal limits  TROPONIN I  TROPONIN I    EKG EKG Interpretation  Date/Time:  Sunday November 07 2018 12:27:30 EDT Ventricular Rate:  81 PR Interval:    QRS Duration: 99 QT Interval:  362 QTC Calculation: 421 R Axis:   93 Text Interpretation:  Atrial fibrillation Ventricular premature complex Right axis deviation Repol abnrm suggests ischemia, inferior leads Confirmed by Malvin Johns ([id number]) on 11/07/2018 12:43:26 PM   Radiology Dg Chest Port 1 View  Result Date: 11/07/2018 CLINICAL DATA:  Chest pressure for 3 days. EXAM: PORTABLE CHEST 1 VIEW COMPARISON:  06/28/2018 FINDINGS: Prior CABG and coronary stents. Atherosclerotic calcification of the aortic arch. Heart size within normal limits. Faint increased interstitial accentuation compared prior exam, mostly Kerley A lines. Tapering of the peripheral pulmonary vasculature favors emphysema. Biapical pleuroparenchymal scarring. IMPRESSION: 1. Aortic  Atherosclerosis (ICD10-I70.0) and Emphysema (ICD10-J43.9). Prior CABG and coronary stenting. 2. Mildly increased but still faint interstitial accentuation compared to the prior exam, nonspecific. Possibilities might include subtle edema, drug reaction, or early atypical pneumonia. Electronically Signed   By: Van Clines M.D.   On: 11/07/2018 13:38    Procedures Procedures (including critical care time)  Medications Ordered in ED Medications - No data to display   Initial Impression /  Assessment and Plan / ED Course  I have reviewed the triage vital signs and the nursing notes.  Pertinent labs & imaging results that were available during my care of the patient were reviewed by me and considered in my medical decision making (see chart for details).        On chart review, patient has a history of atrial tachycardia.  She is on Cardizem currently at 180 mg once a day.  She was unable to tolerate prior anti-arrhythmic's.  Her heart rate is well controlled.  Her labs are non-concerning.  Her troponin is negative.  There is no ischemic changes on EKG.  I spoke with Dr. Rayann Heman with cardiology.  He recommends starting patient on an anticoagulant.  Patient previously was not on anticoagulant because she had atrial tachycardia versus atrial fibrillation.  On her EKG today, it appears to be more consistent with atrial fibrillation.  He does not recommend any changes in her antiarrhythmics at this point.  He will arrange for close follow-up in the A. fib clinic.  Given her heartburn symptoms, I will check a second troponin.  This is pending.  If negative, pt to be discharged with follow-up this week in the A. fib clinic.  I will also start her on Xarelto.  She was started at 50 mg once a day due to her lower creatinine clearance of 49.  Dr. Colvin Caroli to f/u on troponin.  She has some mild interstitial markings on chest x-ray.  She has no hypoxia.  No shortness of breath.  No symptoms that would be more  concerning for pneumonia or fluid overload.  Final Clinical Impressions(s) / ED Diagnoses   Final diagnoses:  Atrial fibrillation, unspecified type [facility])    ED Discharge Orders         Ordered    Rivaroxaban (XARELTO) 15 MG TABS tablet  Daily with supper     11/07/18 1523           Malvin Johns, MD 11/07/18 1527    Malvin Johns, MD 11/07/18 1528

## 2018-11-07 NOTE — Telephone Encounter (Signed)
    Patient called the after hours line reporting a variety of symptoms for the past 3 days including palpitations, dyspnea, and chest discomfort. Reports her HR has been in the 80's to 90's which is unusual for her as HR is typically in the 50's to 60's. Her iWatch has been reading her rhythm as "irregular". Has a history of SVT but denies any known atrial fibrillation or atrial flutter.   She describes a discomfort along her sternal region for the past day as well. Has acid reflux per her report but has been compliant with Pepcid and Omeprazole. Has been taking additional TUMS with no improvement in her symptoms.   Given her progressive symptoms and new chest discomfort, I recommended she proceed to the ED for further evaluation. She voiced understanding of this and was appreciative of the call.   Signed, Erma Heritage, PA-C 11/07/2018, 11:12 AM Pager: [phone]

## 2018-11-07 NOTE — ED Notes (Signed)
X-ray at bedside

## 2018-11-08 ENCOUNTER — Telehealth (HOSPITAL_COMMUNITY): Payer: Self-pay | Admitting: *Deleted

## 2018-11-08 ENCOUNTER — Telehealth: Payer: Self-pay | Admitting: Cardiology

## 2018-11-08 NOTE — Telephone Encounter (Signed)
New Message              Patient is needing an appointment with the A-Fib clinic, not sure of the schedule for the clinic, pls help.

## 2018-11-08 NOTE — Telephone Encounter (Signed)
-----   Message from Juluis Mire, RN sent at 11/08/2018  8:32 AM EDT -----  ----- Message ----- From: Thompson Grayer, MD Sent: 11/07/2018   2:05 PM EDT To: Juluis Mire, RN  Please get in to AF clinic every this week for ER follow-up.

## 2018-11-08 NOTE — Telephone Encounter (Signed)
Lft vcml for clbk to sched follow up

## 2018-11-11 ENCOUNTER — Encounter (HOSPITAL_COMMUNITY): Payer: Self-pay | Admitting: Physician Assistant

## 2018-11-11 ENCOUNTER — Other Ambulatory Visit: Payer: Self-pay

## 2018-11-11 ENCOUNTER — Ambulatory Visit (HOSPITAL_COMMUNITY)
Admission: RE | Admit: 2018-11-11 | Discharge: 2018-11-11 | Disposition: A | Discharge status: Home / Self Care | Payer: PPO | Source: Ambulatory Visit | Attending: Physician Assistant | Admitting: Physician Assistant

## 2018-11-11 VITALS — BP 118/68 | HR 76 | Ht 62.0 in | Wt 149.0 lb

## 2018-11-11 DIAGNOSIS — Z79899 Other long term (current) drug therapy: Secondary | ICD-10-CM | POA: Insufficient documentation

## 2018-11-11 DIAGNOSIS — G4733 Obstructive sleep apnea (adult) (pediatric): Secondary | ICD-10-CM | POA: Insufficient documentation

## 2018-11-11 DIAGNOSIS — Z7901 Long term (current) use of anticoagulants: Secondary | ICD-10-CM | POA: Insufficient documentation

## 2018-11-11 DIAGNOSIS — E785 Hyperlipidemia, unspecified: Secondary | ICD-10-CM | POA: Diagnosis not present

## 2018-11-11 DIAGNOSIS — I081 Rheumatic disorders of both mitral and tricuspid valves: Secondary | ICD-10-CM | POA: Insufficient documentation

## 2018-11-11 DIAGNOSIS — Z7902 Long term (current) use of antithrombotics/antiplatelets: Secondary | ICD-10-CM | POA: Insufficient documentation

## 2018-11-11 DIAGNOSIS — F329 Major depressive disorder, single episode, unspecified: Secondary | ICD-10-CM | POA: Insufficient documentation

## 2018-11-11 DIAGNOSIS — I480 Paroxysmal atrial fibrillation: Secondary | ICD-10-CM | POA: Diagnosis not present

## 2018-11-11 DIAGNOSIS — Z885 Allergy status to narcotic agent status: Secondary | ICD-10-CM | POA: Diagnosis not present

## 2018-11-11 DIAGNOSIS — Z833 Family history of diabetes mellitus: Secondary | ICD-10-CM | POA: Insufficient documentation

## 2018-11-11 DIAGNOSIS — K219 Gastro-esophageal reflux disease without esophagitis: Secondary | ICD-10-CM | POA: Insufficient documentation

## 2018-11-11 DIAGNOSIS — I10 Essential (primary) hypertension: Secondary | ICD-10-CM | POA: Insufficient documentation

## 2018-11-11 DIAGNOSIS — Z91013 Allergy to seafood: Secondary | ICD-10-CM | POA: Diagnosis not present

## 2018-11-11 DIAGNOSIS — Z886 Allergy status to analgesic agent status: Secondary | ICD-10-CM | POA: Insufficient documentation

## 2018-11-11 DIAGNOSIS — Z880 Allergy status to penicillin: Secondary | ICD-10-CM | POA: Diagnosis not present

## 2018-11-11 DIAGNOSIS — R739 Hyperglycemia, unspecified: Secondary | ICD-10-CM | POA: Insufficient documentation

## 2018-11-11 DIAGNOSIS — Z801 Family history of malignant neoplasm of trachea, bronchus and lung: Secondary | ICD-10-CM | POA: Insufficient documentation

## 2018-11-11 DIAGNOSIS — Z8249 Family history of ischemic heart disease and other diseases of the circulatory system: Secondary | ICD-10-CM | POA: Insufficient documentation

## 2018-11-11 DIAGNOSIS — Z881 Allergy status to other antibiotic agents status: Secondary | ICD-10-CM | POA: Diagnosis not present

## 2018-11-11 DIAGNOSIS — Z91048 Other nonmedicinal substance allergy status: Secondary | ICD-10-CM | POA: Diagnosis not present

## 2018-11-11 DIAGNOSIS — Z955 Presence of coronary angioplasty implant and graft: Secondary | ICD-10-CM | POA: Diagnosis not present

## 2018-11-11 DIAGNOSIS — I25700 Atherosclerosis of coronary artery bypass graft(s), unspecified, with unstable angina pectoris: Secondary | ICD-10-CM | POA: Diagnosis not present

## 2018-11-11 DIAGNOSIS — Z79890 Hormone replacement therapy (postmenopausal): Secondary | ICD-10-CM | POA: Insufficient documentation

## 2018-11-11 DIAGNOSIS — J449 Chronic obstructive pulmonary disease, unspecified: Secondary | ICD-10-CM | POA: Diagnosis not present

## 2018-11-11 DIAGNOSIS — E039 Hypothyroidism, unspecified: Secondary | ICD-10-CM | POA: Insufficient documentation

## 2018-11-11 DIAGNOSIS — I252 Old myocardial infarction: Secondary | ICD-10-CM | POA: Diagnosis not present

## 2018-11-11 DIAGNOSIS — Z87891 Personal history of nicotine dependence: Secondary | ICD-10-CM | POA: Insufficient documentation

## 2018-11-11 DIAGNOSIS — Z951 Presence of aortocoronary bypass graft: Secondary | ICD-10-CM | POA: Insufficient documentation

## 2018-11-11 MED ORDER — DILTIAZEM HCL 30 MG PO TABS: ORAL_TABLET | ORAL | 0 refills | Status: DC

## 2018-11-11 NOTE — Progress Notes (Signed)
Primary Care Physician: Mosie Lukes, MD Primary Cardiologist: Dr Rockey Situ Primary Electrophysiologist: Dr Curt Bears Referring Physician: [facility]   Andrea [name] is a 78 y.o. female with a history ofcoronary disease status post CABG in 1999 status post PCI to the distal RCA in 2013, PCI x2 to the SVG to the RCA into the mid circumflex in 2017, PVD status post lower extremity stenting, carotid artery disease status post left-sided CEA, pulmonary hypertension, valvular heart disease, COPD, infrarenal AAA, PE, hypertension, hyperlipidemia, asthma, hypothyroidism, SVT/atrial tach, and paroxysmal atrial fibrillation who presents for follow up in the [facility].  The patient was initially diagnosed with atrial fibrillation on 11/07/18 after presenting to the ER with symptoms of palpitations, dyspnea, and chest discomfort. At the ER, she was found to be in rate controlled afib. She was started on Xarelto 15 mg daily. She remains in afib today with symptoms of palpitations. She denies any SOB, CP, dizziness, or fluid overload.  Today, she denies symptoms of chest pain, shortness of breath, orthopnea, PND, lower extremity edema, dizziness, presyncope, syncope, snoring, daytime somnolence, bleeding, or neurologic sequela. The patient is tolerating medications without difficulties and is otherwise without complaint today.    Atrial Fibrillation Risk Factors:  she does have symptoms or diagnosis of sleep apnea. she does not have a history of rheumatic fever. she does not have a history of alcohol use. The patient does not have a history of early familial atrial fibrillation or other arrhythmias.  she has a BMI of Body mass index is 27.25 kg/m.Marland Kitchen Filed Weights   11/11/18 1441  Weight: 67.6 kg    Family History  Problem Relation Age of Onset  . Heart attack Mother 87  . Diabetes Mother   . Colon cancer Father 78  . Lung cancer Father        smoked  . Heart  disease Father   . Hypertension Father   . Angina Father   . Lung cancer Sister        smoked  . Hypothyroidism Sister   . Hypertension Brother   . Heart attack Brother   . Heart disease Brother        PTCA with Stent  . Heart attack Brother        CABG  . Heart disease Brother        CABG with 1 bypass  . Aneurysm Brother        brain  . Alcohol abuse Brother   . Hyperlipidemia Daughter   . Diabetes Maternal Grandmother   . Stroke Maternal Grandmother   . Cirrhosis Maternal Grandfather   . Stroke Paternal Grandmother   . Obesity Daughter   . Obesity Son      Atrial Fibrillation Management history:  Previous antiarrhythmic drugs: Multaq (stopped 2/2 side effects) Previous cardioversions: none Previous ablations: EP study 03/19/18 (tachy not inducible. SVT) CHADS2VASC score: 5 Anticoagulation history: Xarelto 15   Past Medical History:  Diagnosis Date  . AAA (abdominal aortic aneurysm) ([place]) 01/2009   AAA (2.8 x 3.0)  moderate RAS (left); 2.7 x 2.7 cm (07/11/05)  . Acute bronchitis 03/20/2013; 2017  . Angina   . Anosmia   . Asthma   . Baker's cyst of knee 01/30/2014  . Basal cell carcinoma    "back and left arm"  . Bradycardia    Metoprolol stopped 08/2011  . CAD (coronary artery disease)    s/CABG (reports IMA and 2 SVGs) back in 1999  Myoview normal 3/10; s/p PCI with DES to PL branch of distal RCA 09/2011; PCI +DES to SVG-RCA, PCI + DES to mid LCx 12/2015  . Cerebrovascular disease 01/2009   carotid u/s  R 0-39%   L 60-79%  . Chronic thoracic back pain   . COPD (chronic obstructive pulmonary disease) ([place])   . Depression   . Dizziness   . Dysrhythmia    hx of sinus brady  . GERD (gastroesophageal reflux disease)   . Heart murmur   . Herniated lumbar disc without myelopathy   . History of blood transfusion 1999   "when I had the bypass; had a PE"  . Hyperglycemia 10/23/2015  . Hyperlipidemia   . Hypertension   . Hypothyroidism 09/30/2016  . Medicare annual  wellness visit, subsequent 07/31/2013  . NSTEMI (non-ST elevated myocardial infarction) ([place]) 11/12   Cath showed atretic IMA graft to the LAD, SVG to PD was patent but the continuation of this graft to the PL branch was occluded; there were L to R collaterals; Mid LAD had a 60 to 70% stenosis. She has been treated medically.  Neg Myoview 05/2011  . Osteoarthritis of back   . Osteoporosis   . Pneumonia "several times"  . Pulmonary embolism ([place]) 1999   "after my bypass"  . PVD (peripheral vascular disease) ([place])   . Shortness of breath   . Squamous carcinoma    "nose"  . Thyroid disease    Hypothyroid  . Unstable angina ([place]) 09/25/2015   Past Surgical History:  Procedure Laterality Date  . ABDOMINAL AORTIC ANEURYSM REPAIR     pt denies this hx on 01/02/2016  . BASAL CELL CARCINOMA EXCISION    . CARDIAC CATHETERIZATION N/A 01/02/2016   Procedure: Left Heart Cath and Coronary Angiography;  Surgeon: Wellington Hampshire, MD;  Location: [facility];  Service: Cardiovascular;  Laterality: N/A;  . CARDIAC CATHETERIZATION  1996; 1999  . CARDIAC CATHETERIZATION N/A 06/25/2016   Procedure: Left Heart Cath and Cors/Grafts Angiography;  Surgeon: Wellington Hampshire, MD;  Location: [facility];  Service: Cardiovascular;  Laterality: N/A;  . CAROTID ENDARTERECTOMY Left 01/02/2011  . CATARACT EXTRACTION W/ INTRAOCULAR LENS  IMPLANT, BILATERAL    . CLOSED REDUCTION NASAL FRACTURE  11/2007  . CORONARY ANGIOPLASTY WITH STENT PLACEMENT  10/10/2011   drug eluting  to rc & saphenous  . CORONARY ARTERY BYPASS GRAFT  1999   "CABG X3"  . DILATION AND CURETTAGE OF UTERUS    . FEMORAL ARTERY STENT Bilateral   . FRACTURE SURGERY    . LEFT HEART CATHETERIZATION WITH CORONARY/GRAFT ANGIOGRAM N/A 04/22/2011   Procedure: LEFT HEART CATHETERIZATION WITH Beatrix Fetters;  Surgeon: Jolaine Artist, MD;  Location[facility] C[facility] LAB;  Service: Cardiovascular;  Laterality: N/A;  . LEFT HEART CATHETERIZATION WITH  CORONARY/GRAFT ANGIOGRAM N/A 10/10/2011   Procedure: LEFT HEART CATHETERIZATION WITH Beatrix Fetters;  Surgeon: Peter M Martinique, MD;  Location: [facility] CATH LAB;  Service: Cardiovascular;  Laterality: N/A;  . NASAL SINUS SURGERY     twice  . SQUAMOUS CELL CARCINOMA EXCISION    . SVT ABLATION N/A 03/19/2018   Procedure: SVT ABLATION;  Surgeon: Constance Haw, MD;  Location: [facility];  Service: Cardiovascular;  Laterality: N/A;  . TUBAL LIGATION      Current Outpatient Medications  Medication Sig Dispense Refill  . acetaminophen (TYLENOL) 650 MG CR tablet Take 1,300 mg by mouth 2 (two) times daily.    Marland Kitchen  albuterol (PROVENTIL HFA;VENTOLIN HFA) 108 (90 Base) MCG/ACT inhaler INHALE 2 PUFFS INTO THE LUNGS EVERY 6 (SIX) HOURS AS NEEDED FOR WHEEZING. (Patient taking differently: Inhale 2 puffs into the lungs every 6 (six) hours as needed for wheezing or shortness of breath. ) 8.5 Inhaler 1  . amLODipine (NORVASC) 5 MG tablet Take 1 tablet (5 mg total) by mouth daily. 90 tablet 3  . azelastine (ASTELIN) 0.1 % nasal spray Place 2 sprays into both nostrils at bedtime as needed for rhinitis. 30 mL 3  . Biotin 5 MG TABS Take 5 mg by mouth daily.     . Calcium Carbonate-Vitamin D (CALCIUM + D PO) Take 1 tablet by mouth daily.     . Cholecalciferol (VITAMIN D3) 2000 units TABS Take 2,000 Units by mouth daily.     . clopidogrel (PLAVIX) 75 MG tablet Take 1 tablet (75 mg total) by mouth daily. 90 tablet 3  . diltiazem (CARDIZEM CD) 180 MG 24 hr capsule Take 1 capsule (180 mg total) by mouth daily. 30 capsule 6  . ezetimibe (ZETIA) 10 MG tablet Take 1 tablet (10 mg total) by mouth daily. (Patient taking differently: Take 10 mg by mouth at bedtime. ) 90 tablet 3  . famotidine (PEPCID) 20 MG tablet TAKE 2 TABLETS (40MG  TOTAL) BY MOUTH EVERYDAY AT BEDTIME (Patient taking differently: Take 40 mg by mouth at bedtime. ) 180 tablet 1  . fluticasone (FLONASE) 50 MCG/ACT nasal spray Place 2 sprays into  both nostrils daily. For congestion (Patient taking differently: Place 2 sprays into both nostrils daily as needed for allergies. ) 48 g 3  . furosemide (LASIX) 20 MG tablet Take 20 mg by mouth every Monday, Wednesday, and Friday.     Marland Kitchen HYDROcodone-acetaminophen (NORCO) 10-325 MG tablet Take 1 tablet by mouth every 6 (six) hours as needed for severe pain.    . isosorbide mononitrate (IMDUR) 60 MG 24 hr tablet TAKE 1 TABLET (60 MG TOTAL) BY MOUTH 2 (TWO) TIMES DAILY. 180 tablet 3  . Krill Oil 500 MG CAPS Take 500 mg by mouth daily.     Marland Kitchen levothyroxine (SYNTHROID, LEVOTHROID) 50 MCG tablet Take 1 tablet (50 mcg total) by mouth daily. (Patient taking differently: Take 50 mcg by mouth daily before breakfast. ) 90 tablet 3  . omeprazole (PRILOSEC) 20 MG capsule TAKE 1 CAPSULE BY MOUTH ONCE DAILY (Patient taking differently: Take 20 mg by mouth daily. ) 30 capsule 5  . Polyethyl Glycol-Propyl Glycol (SYSTANE OP) Place 1 drop into both eyes 2 (two) times a day.     . Rivaroxaban (XARELTO) 15 MG TABS tablet Take 1 tablet (15 mg total) by mouth daily with supper. 30 tablet 0  . rosuvastatin (CRESTOR) 40 MG tablet TAKE 1 TABLET BY MOUTH ONCE A DAY (Patient taking differently: Take 40 mg by mouth at bedtime. ) 90 tablet 3  . sertraline (ZOLOFT) 50 MG tablet TAKE 1 TABLET BY MOUTH ONCE A DAY (Patient taking differently: Take 50 mg by mouth at bedtime. ) 90 tablet 1  . Tiotropium Bromide-Olodaterol (STIOLTO RESPIMAT) 2.5-2.5 MCG/ACT AERS Inhale 2 puffs into the lungs daily. 4 g 10  . Vitamin D, Ergocalciferol, (DRISDOL) 1.25 MG (50000 UT) CAPS capsule TAKE 1 CAPSULE BY MOUTH EVERY 7 DAYS (Patient taking differently: Take 50,000 Units by mouth every Monday. ) 12 capsule 1  . zinc gluconate 50 MG tablet Take 50 mg by mouth daily.    Marland Kitchen diltiazem (CARDIZEM) 30 MG tablet Take one tablet  by mouth every 4 hours AS NEEDED for A-fib HR > 100 as long as BP > 100. 45 tablet 0  . HYDROcodone-homatropine (HYCODAN) 5-1.5 MG/5ML  syrup Take 5 mLs by mouth every 8 (eight) hours as needed for cough. (Patient not taking: Reported on 11/11/2018) 150 mL 0  . nitroGLYCERIN (NITROSTAT) 0.4 MG SL tablet PLACE 1 TABLET (0.4 MG TOTAL) UNDER THE TONGUE EVERY 5 (FIVE) MINUTES AS NEEDED FOR CHEST PAIN. (Patient not taking: Reported on 11/11/2018) 25 tablet 0   No current facility-administered medications for this encounter.     Allergies  Allergen Reactions  . Erythromycin Other (See Comments)    Tongue burns  . Meperidine Nausea And Vomiting  . Shellfish Allergy Nausea And Vomiting  . Ciprofloxacin Rash and Other (See Comments)    Rash from IV  . Codeine Nausea And Vomiting  . Penicillins Rash and Other (See Comments)    Has patient had a PCN reaction causing immediate rash, facial/tongue/throat swelling, SOB or lightheadedness with hypotension:unsure Has patient had a PCN reaction causing severe rash involving mucus membranes or skin necrosis:No Has patient had a PCN reaction that required hospitalization:No Has patient had a PCN reaction occurring within the last 10 years:No If all of the above answers are "NO", then may proceed with Cephalosporin use.   . Tape Rash    pls use paper tape    Social History   Socioeconomic History  . Marital status: Married    Spouse name: Not on file  . Number of children: 5  . Years of education: Not on file  . Highest education level: Not on file  Occupational History  . 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 Financial resource strain: Not on file  . Food insecurity    Worry: Not on file    Inability: Not on file  . Transportation needs    Medical: Not on file    Non-medical: Not on file  Tobacco Use  . Smoking status: Former Smoker    Packs/day: 0.50    Years: 40.00    Pack years: 20.00    Types: Cigarettes    Quit date: 01/19/1993    Years since quitting: 25.8  . Smokeless tobacco: Never Used  Substance and Sexual  Activity  . Alcohol use: Yes    Comment: 01/02/2016 "might drink a glass of wine socially q 3-4 months"  . Drug use: No  . Sexual activity: Not Currently    Birth control/protection: Post-menopausal  Lifestyle  . Physical activity    Days per week: Not on file    Minutes per session: Not on file  . Stress: Not on file  Relationships  . Social Herbalist on phone: Not on file    Gets together: Not on file    Attends religious service: Not on file    Active member of club or organization: Not on file    Attends meetings of clubs or organizations: Not on file    Relationship status: Not on file  . Intimate partner violence    Fear of current or ex partner: Not on file    Emotionally abused: Not on file    Physically abused: Not on file    Forced sexual activity: Not on file  Other Topics Concern  . Not on file  Social History Narrative   Married with 5 children     ROS- All systems  are reviewed and negative except as per the HPI above.  Physical Exam: Vitals:   11/11/18 1441  BP: 118/68  Pulse: 76  Weight: 67.6 kg  Height: 5\' 2"  (1.575 m)    GEN- The patient is well appearing elderly female, alert and oriented x 3 today.   Head- normocephalic, atraumatic Eyes-  Sclera clear, conjunctiva pink Ears- hearing intact Oropharynx- clear Neck- supple  Lungs- Clear to ausculation bilaterally, normal work of breathing Heart- irregular rate and rhythm, no murmurs, rubs or gallops  GI- soft, NT, ND, + BS Extremities- no clubbing, cyanosis, or edema MS- no significant deformity or atrophy Skin- no rash or lesion Psych- euthymic mood, full affect Neuro- strength and sensation are intact  Wt Readings from Last 3 Encounters:  11/11/18 67.6 kg  11/07/18 65.8 kg  06/28/18 64.8 kg    EKG today demonstrates afib HR 76, NST, QRS 88, QTc 441  Echo 12/10/17 demonstrated  - Left ventricle: The cavity size was normal. Wall thickness was   increased in a pattern of mild  LVH. Systolic function was normal.   The estimated ejection fraction was in the range of 55% to 60%.   Wall motion was normal; there were no regional wall motion   abnormalities. - Aortic valve: Valve area (VTI): 1.43 cm^2. Valve area (Vmax):   1.23 cm^2. Valve area (Vmean): 1.24 cm^2. - Mitral valve: There was mild to moderate regurgitation. - Left atrium: The atrium was mildly dilated. - Right atrium: The atrium was moderately to severely dilated. - Tricuspid valve: There was moderate regurgitation. - Pulmonary arteries: PA peak pressure: 49 mm Hg (S).  Impressions:  - Normal LVEF.   MIld LAE.   Moderate to severe RA enlargement.   Mild to moderate MR.   Moderate TR.   MIld pulmonary HTN.  Epic records are reviewed at length today  Assessment and Plan:  1. New onset paroxysmal atrial fibrillation Patient remains in afib today with symptoms of palpitations.  General education about atrial fibrillation discussed and questions answered.  We discussed her stroke risk and the risks and benefits of anticoagulation. Patient's AAD options are limited: did not tolerate Multaq 2/2 GI side effects, class IC contraindicated with CAD, would also avoid amiodarone given lung disease. We discussed Tikosyn, DCCV, and rate control as options. Patient would like to pursue rate control at this time. Will start diltiazem 30 mg PRN q 4hrs for heart racing. Continue Xarelto 15 mg daily.  This patients CHA2DS2-VASc Score and unadjusted Ischemic Stroke Rate (% per year) is equal to 7.2 % stroke rate/year from a score of 5  Above score calculated as 1 point each if present [CHF, HTN, DM, Vascular=MI/PAD/Aortic Plaque, Age if 65-74, or Female] Above score calculated as 2 points each if present [Age > 75, or Stroke/TIA/TE]   2. HTN Stable, no changes today.  3. Obstructive sleep apnea The importance of adequate treatment of sleep apnea was discussed today in order to improve our ability to  maintain sinus rhythm long term.  4. CAD s/p CABG No anginal symptoms. Continue present therapy.    Follow up in AF clinic in one month.   [facility] [address] [place], [place] [address] [phone] 11/12/2018 9:42 AM

## 2018-11-11 NOTE — Patient Instructions (Signed)
Start Diltiazem 30 mg  Take one tablet by mouth every 4 hours AS NEEDED for A-fib HR > 100 as long as BP > 100.

## 2018-11-16 ENCOUNTER — Other Ambulatory Visit: Payer: Self-pay | Admitting: Family Medicine

## 2018-11-18 NOTE — Telephone Encounter (Signed)
This medication is no longer in stock please advise on alternate

## 2018-11-18 NOTE — Telephone Encounter (Signed)
Ok to d/c Famotidine and start Nizatidine 150 mg tabs, 1 tab po bid prn reflux. #60 with 3 rf

## 2018-11-19 ENCOUNTER — Telehealth: Payer: Self-pay | Admitting: Family Medicine

## 2018-11-19 NOTE — Telephone Encounter (Signed)
Medication Refill - Medication: nizatidine (AXID) 150 MG capsule    Has the patient contacted their pharmacy? Yes.   (Agent: If yes, when and what did the pharmacy advise?) Pharmacy called stating that the medication is out of stock and there is no restock date. Please advise.   Preferred Pharmacy (with phone number or street name):  [place], [place] - [place] [phone] (Phone) [phone] (Fax)     Agent: Please be advised that RX refills may take up to 3 business days. We ask that you follow-up with your pharmacy.

## 2018-11-22 ENCOUNTER — Ambulatory Visit: Payer: PPO | Admitting: Pulmonary Disease

## 2018-11-22 NOTE — Telephone Encounter (Signed)
Refills sent 11/19/2018.

## 2018-11-23 ENCOUNTER — Telehealth: Payer: Self-pay | Admitting: Pulmonary Disease

## 2018-11-23 NOTE — Telephone Encounter (Signed)
Called and spoke to pt. Pt is requesting to switch from [facility] to [facility].  Pt is aware that OV and walk test are needed prior to switching to [facility].  Pt has pending OV on 12/10/2018. Updated apt note for reminder that walk is needed.  Pt is aware and voiced her understanding. Nothing further is needed.

## 2018-11-24 ENCOUNTER — Other Ambulatory Visit: Payer: Self-pay | Admitting: Family Medicine

## 2018-12-01 ENCOUNTER — Other Ambulatory Visit: Payer: Self-pay | Admitting: Family Medicine

## 2018-12-01 DIAGNOSIS — G4733 Obstructive sleep apnea (adult) (pediatric): Secondary | ICD-10-CM | POA: Diagnosis not present

## 2018-12-06 ENCOUNTER — Other Ambulatory Visit (HOSPITAL_COMMUNITY): Payer: Self-pay | Admitting: *Deleted

## 2018-12-06 MED ORDER — RIVAROXABAN 15 MG PO TABS: 15.0000 mg | ORAL_TABLET | Freq: Every day | ORAL | 6 refills | Status: DC

## 2018-12-08 ENCOUNTER — Telehealth: Payer: Self-pay | Admitting: Pulmonary Disease

## 2018-12-08 NOTE — Telephone Encounter (Signed)
Called patient for COVID-19 pre-screening for in office visit.  Have you recently traveled any where out of the local area in the last 2 weeks? no  Have you been in close contact with a person diagnosed with COVID-19 or someone awaiting results within the last 2 weeks? no  Do you currently have any of the following symptoms? If so, when did they start? Cough-baseline present for years    Diarrhea   Joint Pain Fever      Muscle Pain   Red eyes Shortness of breath-baseline present for years   Abdominal pain  Vomiting Loss of smell- to sinus surgery    Rash    Sore Throat Headache    Weakness   Bruising or bleeding   Okay to proceed with visit.

## 2018-12-10 ENCOUNTER — Ambulatory Visit: Payer: PPO | Admitting: Pulmonary Disease

## 2018-12-10 ENCOUNTER — Encounter: Payer: Self-pay | Admitting: Pulmonary Disease

## 2018-12-10 ENCOUNTER — Other Ambulatory Visit: Payer: Self-pay

## 2018-12-10 VITALS — BP 110/78 | HR 58 | Temp 97.3°F | Ht 62.0 in | Wt 146.0 lb

## 2018-12-10 DIAGNOSIS — Z87891 Personal history of nicotine dependence: Secondary | ICD-10-CM

## 2018-12-10 DIAGNOSIS — G4733 Obstructive sleep apnea (adult) (pediatric): Secondary | ICD-10-CM | POA: Diagnosis not present

## 2018-12-10 DIAGNOSIS — J449 Chronic obstructive pulmonary disease, unspecified: Secondary | ICD-10-CM | POA: Diagnosis not present

## 2018-12-10 DIAGNOSIS — R0609 Other forms of dyspnea: Secondary | ICD-10-CM

## 2018-12-10 DIAGNOSIS — R0600 Dyspnea, unspecified: Secondary | ICD-10-CM

## 2018-12-10 MED ORDER — TRELEGY ELLIPTA 100-62.5-25 MCG/INH IN AEPB: 1.0000 | INHALATION_SPRAY | Freq: Every day | RESPIRATORY_TRACT | 0 refills | Status: AC

## 2018-12-10 NOTE — Patient Instructions (Signed)
Trial of Trelegy inhaler-1 inhalation daily.  Sample provided.  Use in place of Stiolto until sample is gone, then resume Stiolto inhaler.  If you feel that the Trelegy is superior to Stiolto, contact our office and we will place a prescription for it.  Continue albuterol inhaler as needed for increased shortness of breath, wheezing, chest tightness, cough  Continue CPAP with sleep.  We will try to get company changed from [place] to [place]  Follow-up in 3-4 months with PFTs (lung function test) prior to that visit.  Call sooner if needed.

## 2018-12-15 ENCOUNTER — Ambulatory Visit (HOSPITAL_COMMUNITY)
Admission: RE | Admit: 2018-12-15 | Discharge: 2018-12-15 | Disposition: A | Discharge status: Home / Self Care | Payer: PPO | Source: Ambulatory Visit | Attending: Physician Assistant | Admitting: Physician Assistant

## 2018-12-15 ENCOUNTER — Other Ambulatory Visit: Payer: Self-pay

## 2018-12-15 ENCOUNTER — Encounter (HOSPITAL_COMMUNITY): Payer: Self-pay | Admitting: Physician Assistant

## 2018-12-15 VITALS — BP 140/82 | HR 57 | Ht 62.0 in | Wt 148.0 lb

## 2018-12-15 DIAGNOSIS — J449 Chronic obstructive pulmonary disease, unspecified: Secondary | ICD-10-CM | POA: Insufficient documentation

## 2018-12-15 DIAGNOSIS — R739 Hyperglycemia, unspecified: Secondary | ICD-10-CM | POA: Diagnosis not present

## 2018-12-15 DIAGNOSIS — I25810 Atherosclerosis of coronary artery bypass graft(s) without angina pectoris: Secondary | ICD-10-CM | POA: Insufficient documentation

## 2018-12-15 DIAGNOSIS — Z91013 Allergy to seafood: Secondary | ICD-10-CM | POA: Insufficient documentation

## 2018-12-15 DIAGNOSIS — K219 Gastro-esophageal reflux disease without esophagitis: Secondary | ICD-10-CM | POA: Insufficient documentation

## 2018-12-15 DIAGNOSIS — R609 Edema, unspecified: Secondary | ICD-10-CM | POA: Insufficient documentation

## 2018-12-15 DIAGNOSIS — Z79890 Hormone replacement therapy (postmenopausal): Secondary | ICD-10-CM | POA: Insufficient documentation

## 2018-12-15 DIAGNOSIS — Z833 Family history of diabetes mellitus: Secondary | ICD-10-CM | POA: Diagnosis not present

## 2018-12-15 DIAGNOSIS — I252 Old myocardial infarction: Secondary | ICD-10-CM | POA: Diagnosis not present

## 2018-12-15 DIAGNOSIS — Z955 Presence of coronary angioplasty implant and graft: Secondary | ICD-10-CM | POA: Insufficient documentation

## 2018-12-15 DIAGNOSIS — Z87891 Personal history of nicotine dependence: Secondary | ICD-10-CM | POA: Insufficient documentation

## 2018-12-15 DIAGNOSIS — Z79899 Other long term (current) drug therapy: Secondary | ICD-10-CM | POA: Diagnosis not present

## 2018-12-15 DIAGNOSIS — I714 Abdominal aortic aneurysm, without rupture: Secondary | ICD-10-CM | POA: Diagnosis not present

## 2018-12-15 DIAGNOSIS — E039 Hypothyroidism, unspecified: Secondary | ICD-10-CM | POA: Diagnosis not present

## 2018-12-15 DIAGNOSIS — Z886 Allergy status to analgesic agent status: Secondary | ICD-10-CM | POA: Insufficient documentation

## 2018-12-15 DIAGNOSIS — Z85828 Personal history of other malignant neoplasm of skin: Secondary | ICD-10-CM | POA: Insufficient documentation

## 2018-12-15 DIAGNOSIS — E785 Hyperlipidemia, unspecified: Secondary | ICD-10-CM | POA: Diagnosis not present

## 2018-12-15 DIAGNOSIS — Z951 Presence of aortocoronary bypass graft: Secondary | ICD-10-CM | POA: Diagnosis not present

## 2018-12-15 DIAGNOSIS — I739 Peripheral vascular disease, unspecified: Secondary | ICD-10-CM | POA: Insufficient documentation

## 2018-12-15 DIAGNOSIS — Z86711 Personal history of pulmonary embolism: Secondary | ICD-10-CM | POA: Diagnosis not present

## 2018-12-15 DIAGNOSIS — Z881 Allergy status to other antibiotic agents status: Secondary | ICD-10-CM | POA: Insufficient documentation

## 2018-12-15 DIAGNOSIS — Z888 Allergy status to other drugs, medicaments and biological substances status: Secondary | ICD-10-CM | POA: Insufficient documentation

## 2018-12-15 DIAGNOSIS — Z8249 Family history of ischemic heart disease and other diseases of the circulatory system: Secondary | ICD-10-CM | POA: Diagnosis not present

## 2018-12-15 DIAGNOSIS — I480 Paroxysmal atrial fibrillation: Secondary | ICD-10-CM

## 2018-12-15 DIAGNOSIS — Z800 Family history of malignant neoplasm of digestive organs: Secondary | ICD-10-CM | POA: Diagnosis not present

## 2018-12-15 DIAGNOSIS — Z7901 Long term (current) use of anticoagulants: Secondary | ICD-10-CM | POA: Insufficient documentation

## 2018-12-15 DIAGNOSIS — F329 Major depressive disorder, single episode, unspecified: Secondary | ICD-10-CM | POA: Diagnosis not present

## 2018-12-15 DIAGNOSIS — M810 Age-related osteoporosis without current pathological fracture: Secondary | ICD-10-CM | POA: Insufficient documentation

## 2018-12-15 DIAGNOSIS — I10 Essential (primary) hypertension: Secondary | ICD-10-CM | POA: Diagnosis not present

## 2018-12-15 DIAGNOSIS — Z885 Allergy status to narcotic agent status: Secondary | ICD-10-CM | POA: Insufficient documentation

## 2018-12-15 DIAGNOSIS — R9431 Abnormal electrocardiogram [ECG] [EKG]: Secondary | ICD-10-CM | POA: Insufficient documentation

## 2018-12-15 DIAGNOSIS — Z801 Family history of malignant neoplasm of trachea, bronchus and lung: Secondary | ICD-10-CM | POA: Diagnosis not present

## 2018-12-15 DIAGNOSIS — M47819 Spondylosis without myelopathy or radiculopathy, site unspecified: Secondary | ICD-10-CM | POA: Insufficient documentation

## 2018-12-15 DIAGNOSIS — R001 Bradycardia, unspecified: Secondary | ICD-10-CM | POA: Insufficient documentation

## 2018-12-15 DIAGNOSIS — G4733 Obstructive sleep apnea (adult) (pediatric): Secondary | ICD-10-CM | POA: Insufficient documentation

## 2018-12-15 DIAGNOSIS — I272 Pulmonary hypertension, unspecified: Secondary | ICD-10-CM | POA: Diagnosis not present

## 2018-12-15 LAB — CBC
HCT: 43.2 % (ref 36.0–46.0)
Hemoglobin: 13.7 g/dL (ref 12.0–15.0)
MCH: 26.8 pg (ref 26.0–34.0)
MCHC: 31.7 g/dL (ref 30.0–36.0)
MCV: 84.4 fL (ref 80.0–100.0)
Platelets: 185 10*3/uL (ref 150–400)
RBC: 5.12 MIL/uL — ABNORMAL HIGH (ref 3.87–5.11)
RDW: 14.6 % (ref 11.5–15.5)
WBC: 7.2 10*3/uL (ref 4.0–10.5)
nRBC: 0 % (ref 0.0–0.2)

## 2018-12-15 MED ORDER — AMLODIPINE BESYLATE 5 MG PO TABS: 2.5000 mg | ORAL_TABLET | Freq: Every day | ORAL | 3 refills | Status: DC

## 2018-12-15 NOTE — Patient Instructions (Signed)
Decrease Amlodipine to 2.5mg  once a day (1/2 of your 5mg  tablet)  Follow up in 3 months with Dr. Curt Bears. Their office will contact you for appointment.

## 2018-12-15 NOTE — Progress Notes (Signed)
Primary Care Physician: Mosie Lukes, MD Primary Cardiologist: Dr Rockey Situ Primary Electrophysiologist: Dr Curt Bears Referring Physician: [facility]   Andrea [name] is a 78 y.o. female with a history ofcoronary disease status post CABG in 1999 status post PCI to the distal RCA in 2013, PCI x2 to the SVG to the RCA into the mid circumflex in 2017, PVD status post lower extremity stenting, carotid artery disease status post left-sided CEA, pulmonary hypertension, valvular heart disease, COPD, infrarenal AAA, PE, hypertension, hyperlipidemia, asthma, hypothyroidism, SVT/atrial tach, and paroxysmal atrial fibrillation who presents for follow up in the [facility].  The patient was initially diagnosed with atrial fibrillation on 11/07/18 after presenting to the ER with symptoms of palpitations, dyspnea, and chest discomfort. At the ER, she was found to be in rate controlled afib. She was started on Xarelto 15 mg daily.   On follow up today, patient reports that she feels well with only rare, brief episodes of palpitations. Her Apple Watch has showed NSR for the last two weeks. She does note some increased swelling in her feet, L>R. She denies PND or orthopnea. SOB is at baseline.  Today, she denies symptoms of chest pain, shortness of breath, orthopnea, PND, dizziness, presyncope, syncope, bleeding, or neurologic sequela. The patient is tolerating medications without difficulties and is otherwise without complaint today.    Atrial Fibrillation Risk Factors:  she does have symptoms or diagnosis of sleep apnea. she does not have a history of rheumatic fever. she does not have a history of alcohol use. The patient does not have a history of early familial atrial fibrillation or other arrhythmias.  she has a BMI of Body mass index is 27.07 kg/m.Marland Kitchen Filed Weights   12/15/18 1505  Weight: 67.1 kg    Family History  Problem Relation Age of Onset  . Heart attack Mother  82  . Diabetes Mother   . Colon cancer Father 56  . Lung cancer Father        smoked  . Heart disease Father   . Hypertension Father   . Angina Father   . Lung cancer Sister        smoked  . Hypothyroidism Sister   . Hypertension Brother   . Heart attack Brother   . Heart disease Brother        PTCA with Stent  . Heart attack Brother        CABG  . Heart disease Brother        CABG with 1 bypass  . Aneurysm Brother        brain  . Alcohol abuse Brother   . Hyperlipidemia Daughter   . Diabetes Maternal Grandmother   . Stroke Maternal Grandmother   . Cirrhosis Maternal Grandfather   . Stroke Paternal Grandmother   . Obesity Daughter   . Obesity Son      Atrial Fibrillation Management history:  Previous antiarrhythmic drugs: Multaq (stopped 2/2 side effects) Previous cardioversions: none Previous ablations: EP study 03/19/18 (tachy not inducible. SVT) CHADS2VASC score: 5 Anticoagulation history: Xarelto 15   Past Medical History:  Diagnosis Date  . AAA (abdominal aortic aneurysm) ([place]) 01/2009   AAA (2.8 x 3.0)  moderate RAS (left); 2.7 x 2.7 cm (07/11/05)  . Acute bronchitis 03/20/2013; 2017  . Angina   . Anosmia   . Asthma   . Baker's cyst of knee 01/30/2014  . Basal cell carcinoma    "back and left arm"  .  Bradycardia    Metoprolol stopped 08/2011  . CAD (coronary artery disease)    s/CABG (reports IMA and 2 SVGs) back in 1999  Myoview normal 3/10; s/p PCI with DES to PL branch of distal RCA 09/2011; PCI +DES to SVG-RCA, PCI + DES to mid LCx 12/2015  . Cerebrovascular disease 01/2009   carotid u/s  R 0-39%   L 60-79%  . Chronic thoracic back pain   . COPD (chronic obstructive pulmonary disease) ([place])   . Depression   . Dizziness   . Dysrhythmia    hx of sinus brady  . GERD (gastroesophageal reflux disease)   . Heart murmur   . Herniated lumbar disc without myelopathy   . History of blood transfusion 1999   "when I had the bypass; had a PE"  . Hyperglycemia  10/23/2015  . Hyperlipidemia   . Hypertension   . Hypothyroidism 09/30/2016  . Medicare annual wellness visit, subsequent 07/31/2013  . NSTEMI (non-ST elevated myocardial infarction) ([place]) 11/12   Cath showed atretic IMA graft to the LAD, SVG to PD was patent but the continuation of this graft to the PL branch was occluded; there were L to R collaterals; Mid LAD had a 60 to 70% stenosis. She has been treated medically.  Neg Myoview 05/2011  . Osteoarthritis of back   . Osteoporosis   . Pneumonia "several times"  . Pulmonary embolism ([place]) 1999   "after my bypass"  . PVD (peripheral vascular disease) ([place])   . Shortness of breath   . Squamous carcinoma    "nose"  . Thyroid disease    Hypothyroid  . Unstable angina ([place]) 09/25/2015   Past Surgical History:  Procedure Laterality Date  . ABDOMINAL AORTIC ANEURYSM REPAIR     pt denies this hx on 01/02/2016  . BASAL CELL CARCINOMA EXCISION    . CARDIAC CATHETERIZATION N/A 01/02/2016   Procedure: Left Heart Cath and Coronary Angiography;  Surgeon: Wellington Hampshire, MD;  Location: [facility];  Service: Cardiovascular;  Laterality: N/A;  . CARDIAC CATHETERIZATION  1996; 1999  . CARDIAC CATHETERIZATION N/A 06/25/2016   Procedure: Left Heart Cath and Cors/Grafts Angiography;  Surgeon: Wellington Hampshire, MD;  Location: [facility];  Service: Cardiovascular;  Laterality: N/A;  . CAROTID ENDARTERECTOMY Left 01/02/2011  . CATARACT EXTRACTION W/ INTRAOCULAR LENS  IMPLANT, BILATERAL    . CLOSED REDUCTION NASAL FRACTURE  11/2007  . CORONARY ANGIOPLASTY WITH STENT PLACEMENT  10/10/2011   drug eluting  to rc & saphenous  . CORONARY ARTERY BYPASS GRAFT  1999   "CABG X3"  . DILATION AND CURETTAGE OF UTERUS    . FEMORAL ARTERY STENT Bilateral   . FRACTURE SURGERY    . LEFT HEART CATHETERIZATION WITH CORONARY/GRAFT ANGIOGRAM N/A 04/22/2011   Procedure: LEFT HEART CATHETERIZATION WITH Beatrix Fetters;  Surgeon: Jolaine Artist, MD;   Location[facility];  Service: Cardiovascular;  Laterality: N/A;  . LEFT HEART CATHETERIZATION WITH CORONARY/GRAFT ANGIOGRAM N/A 10/10/2011   Procedure: LEFT HEART CATHETERIZATION WITH Beatrix Fetters;  Surgeon: Peter M Martinique, MD;  Location: [facility] CATH LAB;  Service: Cardiovascular;  Laterality: N/A;  . NASAL SINUS SURGERY     twice  . SQUAMOUS CELL CARCINOMA EXCISION    . SVT ABLATION N/A 03/19/2018   Procedure: SVT ABLATION;  Surgeon: Constance Haw, MD;  Location: [facility];  Service: Cardiovascular;  Laterality: N/A;  . TUBAL LIGATION      Current Outpatient Medications  Medication Sig Dispense Refill  . acetaminophen (TYLENOL) 650 MG CR tablet Take 1,300 mg by mouth 2 (two) times daily.    Marland Kitchen albuterol (PROVENTIL HFA;VENTOLIN HFA) 108 (90 Base) MCG/ACT inhaler INHALE 2 PUFFS INTO THE LUNGS EVERY 6 (SIX) HOURS AS NEEDED FOR WHEEZING. (Patient taking differently: Inhale 2 puffs into the lungs every 6 (six) hours as needed for wheezing or shortness of breath. ) 8.5 Inhaler 1  . amLODipine (NORVASC) 5 MG tablet Take 0.5 tablets (2.5 mg total) by mouth daily. 90 tablet 3  . azelastine (ASTELIN) 0.1 % nasal spray Place 2 sprays into both nostrils at bedtime as needed for rhinitis. 30 mL 3  . Biotin 5 MG TABS Take 5 mg by mouth daily.     . Calcium Carbonate-Vitamin D (CALCIUM + D PO) Take 1 tablet by mouth daily.     . Cholecalciferol (VITAMIN D3) 2000 units TABS Take 2,000 Units by mouth daily.     . clopidogrel (PLAVIX) 75 MG tablet Take 1 tablet (75 mg total) by mouth daily. 90 tablet 3  . diltiazem (CARDIZEM CD) 180 MG 24 hr capsule Take 1 capsule (180 mg total) by mouth daily. 30 capsule 6  . ezetimibe (ZETIA) 10 MG tablet Take 1 tablet (10 mg total) by mouth daily. (Patient taking differently: Take 10 mg by mouth at bedtime. ) 90 tablet 3  . fluticasone (FLONASE) 50 MCG/ACT nasal spray Place 2 sprays into both nostrils daily. For congestion (Patient taking  differently: Place 2 sprays into both nostrils daily as needed for allergies. ) 48 g 3  . furosemide (LASIX) 20 MG tablet Take 20 mg by mouth every Monday, Wednesday, and Friday.     Marland Kitchen HYDROcodone-acetaminophen (NORCO) 10-325 MG tablet Take 1 tablet by mouth every 6 (six) hours as needed for severe pain.    Marland Kitchen HYDROcodone-homatropine (HYCODAN) 5-1.5 MG/5ML syrup Take 5 mLs by mouth every 8 (eight) hours as needed for cough. 150 mL 0  . isosorbide mononitrate (IMDUR) 60 MG 24 hr tablet TAKE 1 TABLET (60 MG TOTAL) BY MOUTH 2 (TWO) TIMES DAILY. 180 tablet 3  . Krill Oil 500 MG CAPS Take 500 mg by mouth daily.     Marland Kitchen levothyroxine (SYNTHROID, LEVOTHROID) 50 MCG tablet Take 1 tablet (50 mcg total) by mouth daily. (Patient taking differently: Take 50 mcg by mouth daily before breakfast. ) 90 tablet 3  . nizatidine (AXID) 150 MG capsule Take 1 capsule (150 mg total) by mouth 2 (two) times daily. 180 capsule 1  . omeprazole (PRILOSEC) 20 MG capsule TAKE 1 CAPSULE BY MOUTH ONCE DAILY (Patient taking differently: Take 20 mg by mouth daily. ) 30 capsule 5  . Polyethyl Glycol-Propyl Glycol (SYSTANE OP) Place 1 drop into both eyes 2 (two) times a day.     . Rivaroxaban (XARELTO) 15 MG TABS tablet Take 1 tablet (15 mg total) by mouth daily with supper. 30 tablet 6  . rosuvastatin (CRESTOR) 40 MG tablet TAKE 1 TABLET BY MOUTH ONCE A DAY (Patient taking differently: Take 40 mg by mouth at bedtime. ) 90 tablet 3  . sertraline (ZOLOFT) 50 MG tablet TAKE 1 TABLET BY MOUTH ONCE A DAY 90 tablet 1  . Tiotropium Bromide-Olodaterol (STIOLTO RESPIMAT) 2.5-2.5 MCG/ACT AERS Inhale 2 puffs into the lungs daily. 4 g 10  . Vitamin D, Ergocalciferol, (DRISDOL) 1.25 MG (50000 UT) CAPS capsule TAKE 1 CAPSULE BY MOUTH ONCE A WEEK 12 capsule 1  . zinc gluconate 50 MG tablet Take  50 mg by mouth daily.    Marland Kitchen diltiazem (CARDIZEM) 30 MG tablet Take one tablet by mouth every 4 hours AS NEEDED for A-fib HR > 100 as long as BP > 100. (Patient  not taking: Reported on 12/15/2018) 45 tablet 0  . nitroGLYCERIN (NITROSTAT) 0.4 MG SL tablet PLACE 1 TABLET (0.4 MG TOTAL) UNDER THE TONGUE EVERY 5 (FIVE) MINUTES AS NEEDED FOR CHEST PAIN. (Patient not taking: Reported on 12/15/2018) 25 tablet 0   No current facility-administered medications for this encounter.     Allergies  Allergen Reactions  . Erythromycin Other (See Comments)    Tongue burns  . Meperidine Nausea And Vomiting  . Shellfish Allergy Nausea And Vomiting  . Ciprofloxacin Rash and Other (See Comments)    Rash from IV  . Codeine Nausea And Vomiting  . Penicillins Rash and Other (See Comments)    Has patient had a PCN reaction causing immediate rash, facial/tongue/throat swelling, SOB or lightheadedness with hypotension:unsure Has patient had a PCN reaction causing severe rash involving mucus membranes or skin necrosis:No Has patient had a PCN reaction that required hospitalization:No Has patient had a PCN reaction occurring within the last 10 years:No If all of the above answers are "NO", then may proceed with Cephalosporin use.   . Tape Rash    pls use paper tape    Social History   Socioeconomic History  . Marital status: Married    Spouse name: Not on file  . Number of children: 5  . Years of education: Not on file  . Highest education level: Not on file  Occupational History  . 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 Financial resource strain: Not on file  . Food insecurity    Worry: Not on file    Inability: Not on file  . Transportation needs    Medical: Not on file    Non-medical: Not on file  Tobacco Use  . Smoking status: Former Smoker    Packs/day: 0.50    Years: 40.00    Pack years: 20.00    Types: Cigarettes    Quit date: 01/19/1993    Years since quitting: 25.9  . Smokeless tobacco: Never Used  Substance and Sexual Activity  . Alcohol use: Yes    Comment: 01/02/2016 "might drink a glass of wine  socially q 3-4 months"  . Drug use: No  . Sexual activity: Not Currently    Birth control/protection: Post-menopausal  Lifestyle  . Physical activity    Days per week: Not on file    Minutes per session: Not on file  . Stress: Not on file  Relationships  . Social Herbalist on phone: Not on file    Gets together: Not on file    Attends religious service: Not on file    Active member of club or organization: Not on file    Attends meetings of clubs or organizations: Not on file    Relationship status: Not on file  . Intimate partner violence    Fear of current or ex partner: Not on file    Emotionally abused: Not on file    Physically abused: Not on file    Forced sexual activity: Not on file  Other Topics Concern  . Not on file  Social History Narrative   Married with 5 children     ROS- All systems are reviewed and negative except as  per the HPI above.  Physical Exam: Vitals:   12/15/18 1505  BP: 140/82  Pulse: (!) 57  Weight: 67.1 kg  Height: 5\' 2"  (1.575 m)    GEN- The patient is well appearing elderly female, alert and oriented x 3 today.   HEENT-head normocephalic, atraumatic, sclera clear, conjunctiva pink, hearing intact, trachea midline. Lungs- Clear to ausculation bilaterally, normal work of breathing Heart- Regular rate and rhythm, no murmurs, rubs or gallops  GI- soft, NT, ND, + BS Extremities- no clubbing, cyanosis. Trace edema L>R MS- no significant deformity or atrophy Skin- no rash or lesion Psych- euthymic mood, full affect Neuro- strength and sensation are intact   Wt Readings from Last 3 Encounters:  12/15/18 67.1 kg  12/10/18 66.2 kg  11/11/18 67.6 kg    EKG today demonstrates SR HR 57, NST (baseline), PR 128, QRS 92, QTc 449  Echo 12/10/17 demonstrated  - Left ventricle: The cavity size was normal. Wall thickness was   increased in a pattern of mild LVH. Systolic function was normal.   The estimated ejection fraction was in  the range of 55% to 60%.   Wall motion was normal; there were no regional wall motion   abnormalities. - Aortic valve: Valve area (VTI): 1.43 cm^2. Valve area (Vmax):   1.23 cm^2. Valve area (Vmean): 1.24 cm^2. - Mitral valve: There was mild to moderate regurgitation. - Left atrium: The atrium was mildly dilated. - Right atrium: The atrium was moderately to severely dilated. - Tricuspid valve: There was moderate regurgitation. - Pulmonary arteries: PA peak pressure: 49 mm Hg (S).  Impressions:  - Normal LVEF.   MIld LAE.   Moderate to severe RA enlargement.   Mild to moderate MR.   Moderate TR.   MIld pulmonary HTN.  Epic records are reviewed at length today  Assessment and Plan:  1. Paroxysmal atrial fibrillation Patient is in SR today. She tracks her afib using her Apple Watch and has not had any recent episodes. Patient's AAD options are limited: did not tolerate Multaq 2/2 GI side effects, class IC contraindicated with CAD, would also avoid amiodarone given lung disease.  Patient would like to avoid Tikosyn for now given she is in SR and minimally symptomatic in afib. Continue diltiazem 180 mg daily and 30 mg PRN q 4hrs for heart racing. Continue Xarelto 15 mg daily. Check CBC today.  This patients CHA2DS2-VASc Score and unadjusted Ischemic Stroke Rate (% per year) is equal to 7.2 % stroke rate/year from a score of 5  Above score calculated as 1 point each if present [CHF, HTN, DM, Vascular=MI/PAD/Aortic Plaque, Age if 65-74, or Female] Above score calculated as 2 points each if present [Age > 75, or Stroke/TIA/TE]   2. HTN Stable, will decrease amlodipine to 2.5 mg daily. Pt to keep BP log for review at follow up.  3. Obstructive sleep apnea The importance of adequate treatment of sleep apnea was discussed today in order to improve our ability to maintain sinus rhythm long term. Encouraged compliance with CPAP therapy.  4. CAD s/p CABG No anginal symptoms. Continue  present therapy.   5. Edema Trace edema noted on exam. No symptoms of fluid overload. ? CCBs. Will decrease amlodipine as above. Patient can take any extra PRN dose of Lasix.  Encouraged leg elevated and 2g sodium diet.   Follow up with Dr Curt Bears in 3 months. Dr Rockey Situ as scheduled.    [facility] [address]  [place], [place] [address] [phone] 12/15/2018 3:35 PM

## 2018-12-16 ENCOUNTER — Encounter: Payer: Self-pay | Admitting: Pulmonary Disease

## 2018-12-16 NOTE — Progress Notes (Signed)
PROBLEMS: Former smoker (1/2 PPD x40 years, quit 1994)  COPD with significant asthmatic component. Mild OSA on CPAP   DATA: PFTs 07/25/15: Mild-mod obstruction. FEV1 1.22 > 1.31 liters (65 > 69% pred) 0/26/11 CT chest: Moderate changes of emphysema predominantly affecting both upper lobes 02/26/15 echocardiogram: LVEF 60-65%.  Grade 1 diastolic dysfunction.  Mild-moderate MR.  LA is mildly dilated.  PA pressures estimated within normal range. 07/25/15 PFTs: FVC: 2.20 > 2.32 L (87 > 92 %pred), FEV1: 1.22 > 1.31 L (65 > 69 %pred), FEV1/FVC: 57%, TLC: 3.81 L (80 %pred), DLCO 79% %pred.  Gas dilution technique probably underestimating TLC 08/11/16 echocardiogram: LVEF 60-65%.  Grade 1 diastolic dysfunction.  Mild-moderate MR.  LA mildly dilated.  RV systolic pressure estimate 57 mmHg.  RA and RV both mildly dilated. 0/18/19 echocardiogram: LVEF 55-60%.  Mild-moderate MR.  LA mildly dilated.  RV systolic pressure estimate 49 mmHg.  Right atrium moderately-severely dilated. 01/18/2018 overnight oximetry: Lowest SPO2 81%.  Total time less than 88%: 213 minutes (out of 228 minutes total sleep time).  Nocturnal oxygen initiated on the basis of these findings 05/12/18 PSG: AHI 5.1/hr, Lowest SpO2 81%. Recommended AutoSet 5-15 cm H2O 06/16-07/15/20 CPAP compliance: 27/30 nights. Mean pressure 10.7 cm H2O. Max pressure 13.8 cm H2O. AHI 0.3 events/hr  INTERVAL: Last seen 06/22/18.  At that time, she was treated as a COPD exacerbation with improvement in her symptoms.  No major pulmonary events in the interim.  She was diagnosed with new onset atrial fibrillation 11/07/2018  SUBJ: This is a scheduled follow-up.  With regard to sleep apnea, she feels that she is being well treated with this.  In her words she is no longer "exhausted".  With regard to COPD, she believes that her exertional dyspnea is generally worse and with some increased cough, chest tightness and sputum production.  She denies fever,  purulent sputum, pleurodynia and hemoptysis.  OBJ: Vitals:   12/10/18 1131  BP: 110/78  Pulse: (!) 58  Temp: (!) 97.3 F (36.3 C)  TempSrc: Skin  SpO2: 94%  Weight: 146 lb (66.2 kg)  Height: 5\' 2"  (1.575 m)  RA  Gen: NAD HEENT: NCAT, sclerae white Neck: No JVD Lungs: Diminished breath sounds without wheezes Cardiovascular: RRR, no murmurs Abdomen: Soft, nontender, normal BS Ext: without clubbing, cyanosis, edema Neuro: grossly intact Skin: Limited exam, no lesions noted    DATA: BMP Latest Ref Rng & Units 11/07/2018 06/28/2018 03/09/2018  Glucose 70 - 99 mg/dL 101(H) 85 97  BUN 8 - 23 mg/dL 14 20 15   Creatinine 0.44 - 1.00 mg/dL 0.98 0.97 0.88  BUN/Creat Ratio 12 - 28 - - 17  Sodium 135 - 145 mmol/L 141 143 145(H)  Potassium 3.5 - 5.1 mmol/L 3.7 3.7 3.7  Chloride 98 - 111 mmol/L 108 102 105  CO2 22 - 32 mmol/L 25 32 25  Calcium 8.9 - 10.3 mg/dL 9.5 9.3 9.0    CBC Latest Ref Rng & Units 12/15/2018 11/07/2018 06/28/2018  WBC 4.0 - 10.5 K/uL 7.2 7.1 10.9(H)  Hemoglobin 12.0 - 15.0 g/dL 13.7 14.6 14.7  Hematocrit 36.0 - 46.0 % 43.2 45.8 44.4  Platelets 150 - 400 K/uL 185 195 189.0    CXR 06/14: No new film   IMPRESSION:   ICD-10-CM   1. COPD, moderate ([place])  J44.9 Pulmonary Function Test [facility] Only    CANCELED: Pulse oximetry, overnight  2. OSA (obstructive sleep apnea)  G47.33 AMB REFERRAL FOR DME  3. Mildly increased exertional  dyspnea  R06.09   4. Former smoker (remote)  [phone]     PLAN: Trial of Trelegy inhaler, 1 inhalation daily.  Sample provided.  She is instructed to use this in place of the Stiolto inhaler until the sample is gone, then resume Stiolto.  If she feels that the Trelegy is superior, she is to contact our office and we will place a prescription for.  Continue albuterol inhaler as needed  Continue CPAP with sleep  Follow-up in 3-4 months with PFTs prior to that visit.  Call sooner if needed.   Merton Border, MD PCCM service Mobile  [phone] Pager [phone] 12/16/2018 4:53 PM

## 2018-12-22 ENCOUNTER — Encounter: Payer: Self-pay | Admitting: *Deleted

## 2018-12-22 ENCOUNTER — Telehealth: Payer: Self-pay | Admitting: *Deleted

## 2018-12-22 DIAGNOSIS — Z006 Encounter for examination for normal comparison and control in clinical research program: Secondary | ICD-10-CM

## 2018-12-22 NOTE — Telephone Encounter (Signed)
LEADERS FREE II Research study: Several attempts to reach patient for the last Telephone follow up for the stent study. All phone numbers that are provided are not accepting voice mail options. Husbands cell called today and Voice mail left for patient to call Research office or my cell. Emailed patient 06/jul/2020 and 28/jul/2020.

## 2018-12-22 NOTE — Research (Signed)
LEADERS FREE II Research study 36 month telephone follow up completed. Patient denies any adverse events. States she was in NRS 12/01/2018. Continues on Xarelto and Plavix. She denies any chest pain. I thanked her for her participation in the study, and informed her that the stent thus far was showing superiority in the Polymer free DES. When the study is final we will notify her the final results either via mail or phone call.

## 2019-01-01 DIAGNOSIS — G4733 Obstructive sleep apnea (adult) (pediatric): Secondary | ICD-10-CM | POA: Diagnosis not present

## 2019-01-06 DIAGNOSIS — G4733 Obstructive sleep apnea (adult) (pediatric): Secondary | ICD-10-CM | POA: Diagnosis not present

## 2019-01-18 DIAGNOSIS — R208 Other disturbances of skin sensation: Secondary | ICD-10-CM | POA: Diagnosis not present

## 2019-01-18 DIAGNOSIS — L570 Actinic keratosis: Secondary | ICD-10-CM | POA: Diagnosis not present

## 2019-01-18 DIAGNOSIS — X32XXXA Exposure to sunlight, initial encounter: Secondary | ICD-10-CM | POA: Diagnosis not present

## 2019-01-18 DIAGNOSIS — D2261 Melanocytic nevi of right upper limb, including shoulder: Secondary | ICD-10-CM | POA: Diagnosis not present

## 2019-01-18 DIAGNOSIS — D2262 Melanocytic nevi of left upper limb, including shoulder: Secondary | ICD-10-CM | POA: Diagnosis not present

## 2019-01-18 DIAGNOSIS — D485 Neoplasm of uncertain behavior of skin: Secondary | ICD-10-CM | POA: Diagnosis not present

## 2019-01-18 DIAGNOSIS — D2271 Melanocytic nevi of right lower limb, including hip: Secondary | ICD-10-CM | POA: Diagnosis not present

## 2019-01-18 DIAGNOSIS — C44619 Basal cell carcinoma of skin of left upper limb, including shoulder: Secondary | ICD-10-CM | POA: Diagnosis not present

## 2019-01-18 DIAGNOSIS — L538 Other specified erythematous conditions: Secondary | ICD-10-CM | POA: Diagnosis not present

## 2019-01-18 DIAGNOSIS — Z85828 Personal history of other malignant neoplasm of skin: Secondary | ICD-10-CM | POA: Diagnosis not present

## 2019-01-21 ENCOUNTER — Telehealth: Payer: Self-pay | Admitting: Pulmonary Disease

## 2019-01-21 DIAGNOSIS — G4733 Obstructive sleep apnea (adult) (pediatric): Secondary | ICD-10-CM

## 2019-01-21 NOTE — Telephone Encounter (Signed)
Order for cpap supplies has been sent to [facility] per pt request. Pt is aware and voiced her understanding.  Nothing further is needed.

## 2019-01-26 ENCOUNTER — Other Ambulatory Visit: Payer: Self-pay | Admitting: Cardiology

## 2019-01-26 ENCOUNTER — Other Ambulatory Visit: Payer: Self-pay | Admitting: Family Medicine

## 2019-02-01 DIAGNOSIS — G4733 Obstructive sleep apnea (adult) (pediatric): Secondary | ICD-10-CM | POA: Diagnosis not present

## 2019-03-01 ENCOUNTER — Other Ambulatory Visit: Payer: Self-pay | Admitting: Cardiovascular Disease

## 2019-03-01 DIAGNOSIS — G4733 Obstructive sleep apnea (adult) (pediatric): Secondary | ICD-10-CM | POA: Diagnosis not present

## 2019-03-03 ENCOUNTER — Telehealth: Payer: Self-pay | Admitting: Internal Medicine

## 2019-03-03 ENCOUNTER — Other Ambulatory Visit: Payer: Self-pay

## 2019-03-03 DIAGNOSIS — G4733 Obstructive sleep apnea (adult) (pediatric): Secondary | ICD-10-CM | POA: Diagnosis not present

## 2019-03-03 NOTE — Telephone Encounter (Signed)
Pt is aware of date/time of covid test.   

## 2019-03-07 ENCOUNTER — Other Ambulatory Visit: Admission: RE | Admit: 2019-03-07 | Payer: PPO | Source: Ambulatory Visit

## 2019-03-08 ENCOUNTER — Ambulatory Visit: Payer: PPO

## 2019-03-08 NOTE — Telephone Encounter (Signed)
PFT R/S for Thurs 03/17/2019 at 10:15 at [facility].  Pt to arrive at 10:00. COVID test to be arranged for 03/16/2019.  Message forwarded to nurse so she will be aware to arrange test.    LMOVM for pt to return my call regarding new PFT appointment. Rhonda J Cobb

## 2019-03-08 NOTE — Telephone Encounter (Signed)
LMOVM for Cardiopulmonary to return my call to R/S PFT. Rhonda J Cobb

## 2019-03-08 NOTE — Telephone Encounter (Signed)
Pt shoed for covid test today. Pt should have had covid test yesterday and PFT today.  Pt aware that PFT will need to be rescheduled.   Rhonda, please advise.

## 2019-03-09 ENCOUNTER — Other Ambulatory Visit: Payer: Self-pay

## 2019-03-09 ENCOUNTER — Encounter: Payer: Self-pay | Admitting: Family

## 2019-03-09 ENCOUNTER — Ambulatory Visit (INDEPENDENT_AMBULATORY_CARE_PROVIDER_SITE_OTHER): Payer: PPO | Admitting: Family

## 2019-03-09 ENCOUNTER — Telehealth: Payer: Self-pay | Admitting: Family

## 2019-03-09 VITALS — BP 140/81 | HR 63 | Temp 97.5°F

## 2019-03-09 DIAGNOSIS — J441 Chronic obstructive pulmonary disease with (acute) exacerbation: Secondary | ICD-10-CM

## 2019-03-09 MED ORDER — PREDNISONE 10 MG PO TABS: ORAL_TABLET | ORAL | 0 refills | Status: DC

## 2019-03-09 MED ORDER — CEFDINIR 300 MG PO CAPS: 300.0000 mg | ORAL_CAPSULE | Freq: Two times a day (BID) | ORAL | 0 refills | Status: DC

## 2019-03-09 NOTE — Progress Notes (Signed)
Virtual Visit via telephone Note  I connected with Andrea [name] on 03/09/19 at 11:40 AM EDT by telephone and verified that I am speaking with the correct person using two identifiers.  Location: Patient: home Provider: home   I discussed the limitations of evaluation and management by telemedicine and the availability of in person appointments. The patient expressed understanding and agreed to proceed.  History of Present Illness:  Patient is a 78 yr old female with history of COPD,  who presents today with chief complaint of wheezing .She reports that she has had increased wheezing for the last 5 days. Reports that her shortness of breath is "terrible" on exertion.  Reports intermittent "asthmatic like cough." She does not have a pulse oximeter at home.     Past Medical History:  Diagnosis Date  . AAA (abdominal aortic aneurysm) ([place]) 01/2009   AAA (2.8 x 3.0)  moderate RAS (left); 2.7 x 2.7 cm (07/11/05)  . Acute bronchitis 03/20/2013; 2017  . Angina   . Anosmia   . Asthma   . Baker's cyst of knee 01/30/2014  . Basal cell carcinoma    "back and left arm"  . Bradycardia    Metoprolol stopped 08/2011  . CAD (coronary artery disease)    s/CABG (reports IMA and 2 SVGs) back in 1999  Myoview normal 3/10; s/p PCI with DES to PL branch of distal RCA 09/2011; PCI +DES to SVG-RCA, PCI + DES to mid LCx 12/2015  . Cerebrovascular disease 01/2009   carotid u/s  R 0-39%   L 60-79%  . Chronic thoracic back pain   . COPD (chronic obstructive pulmonary disease) ([place])   . Depression   . Dizziness   . Dysrhythmia    hx of sinus brady  . GERD (gastroesophageal reflux disease)   . Heart murmur   . Herniated lumbar disc without myelopathy   . History of blood transfusion 1999   "when I had the bypass; had a PE"  . Hyperglycemia 10/23/2015  . Hyperlipidemia   . Hypertension   . Hypothyroidism 09/30/2016  . Medicare annual wellness visit, subsequent 07/31/2013  . NSTEMI (non-ST elevated myocardial  infarction) ([place]) 11/12   Cath showed atretic IMA graft to the LAD, SVG to PD was patent but the continuation of this graft to the PL branch was occluded; there were L to R collaterals; Mid LAD had a 60 to 70% stenosis. She has been treated medically.  Neg Myoview 05/2011  . Osteoarthritis of back   . Osteoporosis   . Pneumonia "several times"  . Pulmonary embolism ([place]) 1999   "after my bypass"  . PVD (peripheral vascular disease) ([place])   . Shortness of breath   . Squamous carcinoma    "nose"  . Thyroid disease    Hypothyroid  . Unstable angina ([place]) 09/25/2015     Social History   Socioeconomic History  . Marital status: Married    Spouse name: Not on file  . Number of children: 5  . Years of education: Not on file  . Highest education level: Not on file  Occupational History  . 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 Financial resource strain: Not on file  . Food insecurity    Worry: Not on file    Inability: Not on file  . Transportation needs    Medical: Not on file    Non-medical: Not on file  Tobacco Use  .  Smoking status: Former Smoker    Packs/day: 0.50    Years: 40.00    Pack years: 20.00    Types: Cigarettes    Quit date: 01/19/1993    Years since quitting: 26.1  . Smokeless tobacco: Never Used  Substance and Sexual Activity  . Alcohol use: Yes    Comment: 01/02/2016 "might drink a glass of wine socially q 3-4 months"  . Drug use: No  . Sexual activity: Not Currently    Birth control/protection: Post-menopausal  Lifestyle  . Physical activity    Days per week: Not on file    Minutes per session: Not on file  . Stress: Not on file  Relationships  . Social Herbalist on phone: Not on file    Gets together: Not on file    Attends religious service: Not on file    Active member of club or organization: Not on file    Attends meetings of clubs or organizations: Not on file    Relationship status: Not  on file  . Intimate partner violence    Fear of current or ex partner: Not on file    Emotionally abused: Not on file    Physically abused: Not on file    Forced sexual activity: Not on file  Other Topics Concern  . Not on file  Social History Narrative   Married with 5 children    Past Surgical History:  Procedure Laterality Date  . ABDOMINAL AORTIC ANEURYSM REPAIR     pt denies this hx on 01/02/2016  . BASAL CELL CARCINOMA EXCISION    . CARDIAC CATHETERIZATION N/A 01/02/2016   Procedure: Left Heart Cath and Coronary Angiography;  Surgeon: Wellington Hampshire, MD;  Location: [facility];  Service: Cardiovascular;  Laterality: N/A;  . CARDIAC CATHETERIZATION  1996; 1999  . CARDIAC CATHETERIZATION N/A 06/25/2016   Procedure: Left Heart Cath and Cors/Grafts Angiography;  Surgeon: Wellington Hampshire, MD;  Location: [facility];  Service: Cardiovascular;  Laterality: N/A;  . CAROTID ENDARTERECTOMY Left 01/02/2011  . CATARACT EXTRACTION W/ INTRAOCULAR LENS  IMPLANT, BILATERAL    . CLOSED REDUCTION NASAL FRACTURE  11/2007  . CORONARY ANGIOPLASTY WITH STENT PLACEMENT  10/10/2011   drug eluting  to rc & saphenous  . CORONARY ARTERY BYPASS GRAFT  1999   "CABG X3"  . DILATION AND CURETTAGE OF UTERUS    . FEMORAL ARTERY STENT Bilateral   . FRACTURE SURGERY    . LEFT HEART CATHETERIZATION WITH CORONARY/GRAFT ANGIOGRAM N/A 04/22/2011   Procedure: LEFT HEART CATHETERIZATION WITH Beatrix Fetters;  Surgeon: Jolaine Artist, MD;  Location: [facility] CATH LAB;  Service: Cardiovascular;  Laterality: N/A;  . LEFT HEART CATHETERIZATION WITH CORONARY/GRAFT ANGIOGRAM N/A 10/10/2011   Procedure: LEFT HEART CATHETERIZATION WITH Beatrix Fetters;  Surgeon: Peter M Martinique, MD;  Location: [facility] C[facility];  Service: Cardiovascular;  Laterality: N/A;  . NASAL SINUS SURGERY     twice  . SQUAMOUS CELL CARCINOMA EXCISION    . SVT ABLATION N/A 03/19/2018   Procedure: SVT ABLATION;  Surgeon: Constance Haw, MD;  Location: [facility];  Service: Cardiovascular;  Laterality: N/A;  . TUBAL LIGATION      Family History  Problem Relation Age of Onset  . Heart attack Mother 38  . Diabetes Mother   . Colon cancer Father 64  . Lung cancer Father        smoked  . Heart disease Father   .  Hypertension Father   . Angina Father   . Lung cancer Sister        smoked  . Hypothyroidism Sister   . Hypertension Brother   . Heart attack Brother   . Heart disease Brother        PTCA with Stent  . Heart attack Brother        CABG  . Heart disease Brother        CABG with 1 bypass  . Aneurysm Brother        brain  . Alcohol abuse Brother   . Hyperlipidemia Daughter   . Diabetes Maternal Grandmother   . Stroke Maternal Grandmother   . Cirrhosis Maternal Grandfather   . Stroke Paternal Grandmother   . Obesity Daughter   . Obesity Son     Allergies  Allergen Reactions  . Erythromycin Other (See Comments)    Tongue burns  . Meperidine Nausea And Vomiting  . Shellfish Allergy Nausea And Vomiting  . Ciprofloxacin Rash and Other (See Comments)    Rash from IV  . Codeine Nausea And Vomiting  . Penicillins Rash and Other (See Comments)    Has patient had a PCN reaction causing immediate rash, facial/tongue/throat swelling, SOB or lightheadedness with hypotension:unsure Has patient had a PCN reaction causing severe rash involving mucus membranes or skin necrosis:No Has patient had a PCN reaction that required hospitalization:No Has patient had a PCN reaction occurring within the last 10 years:No If all of the above answers are "NO", then may proceed with Cephalosporin use.   . Tape Rash    pls use paper tape    Current Outpatient Medications on File Prior to Visit  Medication Sig Dispense Refill  . acetaminophen (TYLENOL) 650 MG CR tablet Take 1,300 mg by mouth 2 (two) times daily.    Marland Kitchen albuterol (PROVENTIL HFA;VENTOLIN HFA) 108 (90 Base) MCG/ACT inhaler INHALE 2 PUFFS  INTO THE LUNGS EVERY 6 (SIX) HOURS AS NEEDED FOR WHEEZING. (Patient taking differently: Inhale 2 puffs into the lungs every 6 (six) hours as needed for wheezing or shortness of breath. ) 8.5 Inhaler 1  . amLODipine (NORVASC) 5 MG tablet Take 0.5 tablets (2.5 mg total) by mouth daily. 90 tablet 3  . azelastine (ASTELIN) 0.1 % nasal spray Place 2 sprays into both nostrils at bedtime as needed for rhinitis. 30 mL 3  . Biotin 5 MG TABS Take 5 mg by mouth daily.     . Calcium Carbonate-Vitamin D (CALCIUM + D PO) Take 1 tablet by mouth daily.     . Cholecalciferol (VITAMIN D3) 2000 units TABS Take 2,000 Units by mouth daily.     . clopidogrel (PLAVIX) 75 MG tablet Take 1 tablet (75 mg total) by mouth daily. 90 tablet 3  . diltiazem (CARDIZEM CD) 180 MG 24 hr capsule TAKE 1 CAPSULE BY MOUTH ONCE DAILY 90 capsule 2  . diltiazem (CARDIZEM) 30 MG tablet Take one tablet by mouth every 4 hours AS NEEDED for A-fib HR > 100 as long as BP > 100. 45 tablet 0  . ezetimibe (ZETIA) 10 MG tablet Take 1 tablet (10 mg total) by mouth daily. NEEDS OFFICE VISIT FOR FURTHER REFILLS 90 tablet 0  . fluticasone (FLONASE) 50 MCG/ACT nasal spray Place 2 sprays into both nostrils daily. For congestion (Patient taking differently: Place 2 sprays into both nostrils daily as needed for allergies. ) 48 g 3  . furosemide (LASIX) 20 MG tablet Take 20 mg by mouth every Monday,  Wednesday, and Friday.     Marland Kitchen HYDROcodone-acetaminophen (NORCO) 10-325 MG tablet Take 1 tablet by mouth every 6 (six) hours as needed for severe pain.    Marland Kitchen HYDROcodone-homatropine (HYCODAN) 5-1.5 MG/5ML syrup Take 5 mLs by mouth every 8 (eight) hours as needed for cough. 150 mL 0  . isosorbide mononitrate (IMDUR) 60 MG 24 hr tablet TAKE 1 TABLET (60 MG TOTAL) BY MOUTH 2 (TWO) TIMES DAILY. 180 tablet 3  . Krill Oil 500 MG CAPS Take 500 mg by mouth daily.     Marland Kitchen levothyroxine (SYNTHROID, LEVOTHROID) 50 MCG tablet Take 1 tablet (50 mcg total) by mouth daily. (Patient  taking differently: Take 50 mcg by mouth daily before breakfast. ) 90 tablet 3  . nitroGLYCERIN (NITROSTAT) 0.4 MG SL tablet PLACE 1 TABLET (0.4 MG TOTAL) UNDER THE TONGUE EVERY 5 (FIVE) MINUTES AS NEEDED FOR CHEST PAIN. 25 tablet 0  . nizatidine (AXID) 150 MG capsule Take 1 capsule (150 mg total) by mouth 2 (two) times daily. 180 capsule 1  . omeprazole (PRILOSEC) 20 MG capsule TAKE 1 CAPSULE BY MOUTH ONCE DAILY 30 capsule 5  . Polyethyl Glycol-Propyl Glycol (SYSTANE OP) Place 1 drop into both eyes 2 (two) times a day.     . Rivaroxaban (XARELTO) 15 MG TABS tablet Take 1 tablet (15 mg total) by mouth daily with supper. 30 tablet 6  . rosuvastatin (CRESTOR) 40 MG tablet TAKE 1 TABLET BY MOUTH ONCE A DAY (Patient taking differently: Take 40 mg by mouth at bedtime. ) 90 tablet 3  . sertraline (ZOLOFT) 50 MG tablet TAKE 1 TABLET BY MOUTH ONCE A DAY 90 tablet 1  . Tiotropium Bromide-Olodaterol (STIOLTO RESPIMAT) 2.5-2.5 MCG/ACT AERS Inhale 2 puffs into the lungs daily. 4 g 10  . Vitamin D, Ergocalciferol, (DRISDOL) 1.25 MG (50000 UT) CAPS capsule TAKE 1 CAPSULE BY MOUTH ONCE A WEEK 12 capsule 1  . zinc gluconate 50 MG tablet Take 50 mg by mouth daily.    . [DISCONTINUED] famotidine (PEPCID) 20 MG tablet TAKE 2 TABLETS (40MG  TOTAL) BY MOUTH EVERYDAY AT BEDTIME (Patient taking differently: Take 40 mg by mouth at bedtime. ) 180 tablet 1   No current facility-administered medications on file prior to visit.     Temp (!) 97.5 F (36.4 C) (Oral)    Observations/Objective:   Gen: Awake, alert, no acute distress.  Breathing in full sentences without any obvious conversational dyspnea Resp: Breathing is even and non-labored Psych: calm/pleasant demeanor Neuro: Alert and Oriented x 3, speech is clear.   Assessment and Plan:  COPD exacerbation- advised pt to begin prednisone taper and empiric abx. Chose cefdinir based on her allergy history. I advised her to go to the ER if worsening shortness of  breath and call our office if symptoms are not improved in 2-3 days. She is to continue use of her maintenance inhaler and prn albuterol.  Pt verbalizes understanding and is agreeable to plan.   7 minutes spent on today's call.   Follow Up Instructions:    I discussed the assessment and treatment plan with the patient. The patient was provided an opportunity to ask questions and all were answered. The patient agreed with the plan and demonstrated an understanding of the instructions.   The patient was advised to call back or seek an in-person evaluation if the symptoms worsen or if the condition fails to improve as anticipated.  Nance Pear, NP

## 2019-03-09 NOTE — Telephone Encounter (Signed)
I have called the patient multiple times. I keep getting her voicemail.  Unable to connect to complete her phone visit so far.

## 2019-03-09 NOTE — Telephone Encounter (Signed)
Also, home phone number is out of service and spouse is not picking up his cell.  Left message for patient asking her to call me back if she gets the message in the next 1 hour.

## 2019-03-10 DIAGNOSIS — C44619 Basal cell carcinoma of skin of left upper limb, including shoulder: Secondary | ICD-10-CM | POA: Diagnosis not present

## 2019-03-16 ENCOUNTER — Other Ambulatory Visit
Admission: RE | Admit: 2019-03-16 | Discharge: 2019-03-16 | Disposition: A | Discharge status: Home / Self Care | Payer: PPO | Source: Ambulatory Visit | Attending: Internal Medicine | Admitting: Internal Medicine

## 2019-03-16 ENCOUNTER — Other Ambulatory Visit: Payer: Self-pay

## 2019-03-16 DIAGNOSIS — Z01812 Encounter for preprocedural laboratory examination: Secondary | ICD-10-CM | POA: Diagnosis not present

## 2019-03-16 DIAGNOSIS — Z20828 Contact with and (suspected) exposure to other viral communicable diseases: Secondary | ICD-10-CM | POA: Insufficient documentation

## 2019-03-16 LAB — SARS CORONAVIRUS 2 (TAT 6-24 HRS): SARS Coronavirus 2: NEGATIVE

## 2019-03-17 ENCOUNTER — Other Ambulatory Visit: Payer: Self-pay

## 2019-03-17 ENCOUNTER — Ambulatory Visit: Payer: PPO | Attending: Pulmonary Disease

## 2019-03-17 DIAGNOSIS — J449 Chronic obstructive pulmonary disease, unspecified: Secondary | ICD-10-CM | POA: Insufficient documentation

## 2019-03-17 MED ORDER — ALBUTEROL SULFATE (2.5 MG/3ML) 0.083% IN NEBU
2.5000 mg | INHALATION_SOLUTION | Freq: Once | RESPIRATORY_TRACT | Status: AC
  Administered 2019-03-17: 2.5 mg via RESPIRATORY_TRACT
  Filled 2019-03-17: qty 3

## 2019-03-22 ENCOUNTER — Encounter: Payer: Self-pay | Admitting: Cardiology

## 2019-03-22 ENCOUNTER — Other Ambulatory Visit: Payer: Self-pay

## 2019-03-22 ENCOUNTER — Ambulatory Visit: Payer: PPO | Admitting: Cardiology

## 2019-03-22 VITALS — BP 128/72 | HR 54 | Ht 62.0 in | Wt 151.2 lb

## 2019-03-22 DIAGNOSIS — R0602 Shortness of breath: Secondary | ICD-10-CM | POA: Diagnosis not present

## 2019-03-22 DIAGNOSIS — I480 Paroxysmal atrial fibrillation: Secondary | ICD-10-CM

## 2019-03-22 DIAGNOSIS — H9113 Presbycusis, bilateral: Secondary | ICD-10-CM | POA: Diagnosis not present

## 2019-03-22 DIAGNOSIS — D333 Benign neoplasm of cranial nerves: Secondary | ICD-10-CM | POA: Diagnosis not present

## 2019-03-22 MED ORDER — DILTIAZEM HCL ER COATED BEADS 240 MG PO CP24: 240.0000 mg | ORAL_CAPSULE | Freq: Every day | ORAL | 3 refills | Status: DC

## 2019-03-22 NOTE — Patient Instructions (Signed)
Medication Instructions:  Your physician has recommended you make the following change in your medication:  1. INCREASE Diltiazem to 240 mg once daily  * If you need a refill on your cardiac medications before your next appointment, please call your pharmacy.   Labwork: Today: BNP If you have labs (blood work) drawn today and your tests are completely normal, you will receive your results only by:  [place] (if you have MyChart) OR  A paper copy in the mail If you have any lab test that is abnormal or we need to change your treatment, we will call you to review the results.  Testing/Procedures: None ordered  Follow-Up: At [facility], you and your health needs are our priority.  As part of our continuing mission to provide you with exceptional heart care, we have created designated Provider Care Teams.  These Care Teams include your primary Cardiologist (physician) and Advanced Practice Providers (APPs -  Physician Assistants and Nurse Practitioners) who all work together to provide you with the care you need, when you need it.  You will need a follow up appointment in 6 months.  Please call our office 2 months in advance to schedule this appointment.  You may see Dr Curt Bears or one of the following Advanced Practice Providers on your designated Care Team:    Chanetta Marshall, NP  Tommye Standard, PA-C  Oda Kilts, [place]  Thank you for choosing [facility]!!   Trinidad Curet, RN [phone]

## 2019-03-22 NOTE — Progress Notes (Signed)
Electrophysiology Office Note   Date:  03/22/2019   ID:  Andrea [name], DOB [birth year], MRN [id number]  PCP:  Mosie Lukes, MD  Cardiologist:  Rockey Situ Primary Electrophysiologist:  Gared Gillie Meredith Leeds, MD    No chief complaint on file.    History of Present Illness: Andrea [name] is a 78 y.o. female who is being seen today for the evaluation of SVT at the request of Christell Faith. Presenting today for electrophysiology evaluation.  She has a history of coronary disease status post CABG in 1999 status post PCI to the distal RCA in 2013, PCI x2 to the SVG to the RCA into the mid circumflex in 2017, PVD status post lower extremity stenting, carotid artery disease status post left-sided CEA, pulmonary hypertension, valvular heart disease, COPD, infrarenal AAA, PE, hypertension, hyperlipidemia, asthma, hypothyroidism.  Her a Holter monitor with episodes of SVT/atrial tachycardia, longest lasting 14 beats with frequent PACs and rare PVCs.  She has palpitations on a daily basis.  They last between 5 and 10 minutes at times.  There are no exacerbating or alleviating factors.  Had an EP study on 03/19/2018 which did not induce tachycardia.    Today, denies symptoms of palpitations, chest pain, orthopnea, PND, lower extremity edema, claudication, dizziness, presyncope, syncope, bleeding, or neurologic sequela. The patient is tolerating medications without difficulties.  She has been having dyspnea on exertion and a cough.  This is going on for the last week and a half.  She was prescribed initially antibiotics and prednisone by her primary physician, but she has not had any change in her symptoms.  She does not have any fevers or chills.  Her sense of taste and smell has not changed either.   Past Medical History:  Diagnosis Date  . AAA (abdominal aortic aneurysm) ([place]) 01/2009   AAA (2.8 x 3.0)  moderate RAS (left); 2.7 x 2.7 cm (07/11/05)  . Acute bronchitis 03/20/2013; 2017  . Angina   . Anosmia    . Asthma   . Baker's cyst of knee 01/30/2014  . Basal cell carcinoma    "back and left arm"  . Bradycardia    Metoprolol stopped 08/2011  . CAD (coronary artery disease)    s/CABG (reports IMA and 2 SVGs) back in 1999  Myoview normal 3/10; s/p PCI with DES to PL branch of distal RCA 09/2011; PCI +DES to SVG-RCA, PCI + DES to mid LCx 12/2015  . Cerebrovascular disease 01/2009   carotid u/s  R 0-39%   L 60-79%  . Chronic thoracic back pain   . COPD (chronic obstructive pulmonary disease) ([place])   . Depression   . Dizziness   . Dysrhythmia    hx of sinus brady  . GERD (gastroesophageal reflux disease)   . Heart murmur   . Herniated lumbar disc without myelopathy   . History of blood transfusion 1999   "when I had the bypass; had a PE"  . Hyperglycemia 10/23/2015  . Hyperlipidemia   . Hypertension   . Hypothyroidism 09/30/2016  . Medicare annual wellness visit, subsequent 07/31/2013  . NSTEMI (non-ST elevated myocardial infarction) ([place]) 11/12   Cath showed atretic IMA graft to the LAD, SVG to PD was patent but the continuation of this graft to the PL branch was occluded; there were L to R collaterals; Mid LAD had a 60 to 70% stenosis. She has been treated medically.  Neg Myoview 05/2011  . Osteoarthritis of back   . Osteoporosis   . Pneumonia "  several times"  . Pulmonary embolism ([place]) 1999   "after my bypass"  . PVD (peripheral vascular disease) ([place])   . Shortness of breath   . Squamous carcinoma    "nose"  . Thyroid disease    Hypothyroid  . Unstable angina ([place]) 09/25/2015   Past Surgical History:  Procedure Laterality Date  . ABDOMINAL AORTIC ANEURYSM REPAIR     pt denies this hx on 01/02/2016  . BASAL CELL CARCINOMA EXCISION    . CARDIAC CATHETERIZATION N/A 01/02/2016   Procedure: Left Heart Cath and Coronary Angiography;  Surgeon: Wellington Hampshire, MD;  Location: [place] [facility];  Service: Cardiovascular;  Laterality: N/A;  . CARDIAC CATHETERIZATION  1996; 1999  . CARDIAC  CATHETERIZATION N/A 06/25/2016   Procedure: Left Heart Cath and Cors/Grafts Angiography;  Surgeon: Wellington Hampshire, MD;  Location: [place] [facility];  Service: Cardiovascular;  Laterality: N/A;  . CAROTID ENDARTERECTOMY Left 01/02/2011  . CATARACT EXTRACTION W/ INTRAOCULAR LENS  IMPLANT, BILATERAL    . CLOSED REDUCTION NASAL FRACTURE  11/2007  . CORONARY ANGIOPLASTY WITH STENT PLACEMENT  10/10/2011   drug eluting  to rc & saphenous  . CORONARY ARTERY BYPASS GRAFT  1999   "CABG X3"  . DILATION AND CURETTAGE OF UTERUS    . FEMORAL ARTERY STENT Bilateral   . FRACTURE SURGERY    . LEFT HEART CATHETERIZATION WITH CORONARY/GRAFT ANGIOGRAM N/A 04/22/2011   Procedure: LEFT HEART CATHETERIZATION WITH Beatrix Fetters;  Surgeon: Jolaine Artist, MD;  Location: [facility];  Service: Cardiovascular;  Laterality: N/A;  . LEFT HEART CATHETERIZATION WITH CORONARY/GRAFT ANGIOGRAM N/A 10/10/2011   Procedure: LEFT HEART CATHETERIZATION WITH Beatrix Fetters;  Surgeon: Peter M Martinique, MD;  Location: [facility] CATH LAB;  Service: Cardiovascular;  Laterality: N/A;  . NASAL SINUS SURGERY     twice  . SQUAMOUS CELL CARCINOMA EXCISION    . SVT ABLATION N/A 03/19/2018   Procedure: SVT ABLATION;  Surgeon: Constance Haw, MD;  Location: [place] [facility];  Service: Cardiovascular;  Laterality: N/A;  . TUBAL LIGATION       Current Outpatient Medications  Medication Sig Dispense Refill  . acetaminophen (TYLENOL) 650 MG CR tablet Take 1,300 mg by mouth 2 (two) times daily.    Marland Kitchen albuterol (PROVENTIL HFA;VENTOLIN HFA) 108 (90 Base) MCG/ACT inhaler INHALE 2 PUFFS INTO THE LUNGS EVERY 6 (SIX) HOURS AS NEEDED FOR WHEEZING. 8.5 Inhaler 1  . amLODipine (NORVASC) 5 MG tablet Take 5 mg by mouth daily.    Marland Kitchen azelastine (ASTELIN) 0.1 % nasal spray Place 2 sprays into both nostrils at bedtime as needed for rhinitis. 30 mL 3  . Biotin 5 MG TABS Take 5 mg by mouth daily.     . Calcium Carbonate-Vitamin D  (CALCIUM + D PO) Take 1 tablet by mouth daily.     . Cholecalciferol (VITAMIN D3) 2000 units TABS Take 2,000 Units by mouth daily.     . clopidogrel (PLAVIX) 75 MG tablet Take 1 tablet (75 mg total) by mouth daily. 90 tablet 3  . diltiazem (CARDIZEM) 30 MG tablet Take one tablet by mouth every 4 hours AS NEEDED for A-fib HR > 100 as long as BP > 100. 45 tablet 0  . ezetimibe (ZETIA) 10 MG tablet Take 1 tablet (10 mg total) by mouth daily. NEEDS OFFICE VISIT FOR FURTHER REFILLS 90 tablet 0  . famotidine (PEPCID) 20 MG tablet Take 20 mg by mouth 2 (two) times daily.    Marland Kitchen  fluticasone (FLONASE) 50 MCG/ACT nasal spray Place 2 sprays into both nostrils daily. For congestion 48 g 3  . furosemide (LASIX) 20 MG tablet Take 20 mg by mouth every Monday, Wednesday, and Friday.     Marland Kitchen HYDROcodone-acetaminophen (NORCO) 10-325 MG tablet Take 1 tablet by mouth every 6 (six) hours as needed for severe pain.    Marland Kitchen HYDROcodone-homatropine (HYCODAN) 5-1.5 MG/5ML syrup Take 5 mLs by mouth every 8 (eight) hours as needed for cough. 150 mL 0  . isosorbide mononitrate (IMDUR) 60 MG 24 hr tablet TAKE 1 TABLET (60 MG TOTAL) BY MOUTH 2 (TWO) TIMES DAILY. 180 tablet 3  . Krill Oil 500 MG CAPS Take 500 mg by mouth daily.     Marland Kitchen levothyroxine (SYNTHROID, LEVOTHROID) 50 MCG tablet Take 1 tablet (50 mcg total) by mouth daily. 90 tablet 3  . nitroGLYCERIN (NITROSTAT) 0.4 MG SL tablet PLACE 1 TABLET (0.4 MG TOTAL) UNDER THE TONGUE EVERY 5 (FIVE) MINUTES AS NEEDED FOR CHEST PAIN. 25 tablet 0  . omeprazole (PRILOSEC) 20 MG capsule TAKE 1 CAPSULE BY MOUTH ONCE DAILY 30 capsule 5  . Polyethyl Glycol-Propyl Glycol (SYSTANE OP) Place 1 drop into both eyes 2 (two) times a day.     . Rivaroxaban (XARELTO) 15 MG TABS tablet Take 1 tablet (15 mg total) by mouth daily with supper. 30 tablet 6  . rosuvastatin (CRESTOR) 40 MG tablet TAKE 1 TABLET BY MOUTH ONCE A DAY 90 tablet 3  . sertraline (ZOLOFT) 50 MG tablet TAKE 1 TABLET BY MOUTH ONCE A DAY  90 tablet 1  . Tiotropium Bromide-Olodaterol (STIOLTO RESPIMAT) 2.5-2.5 MCG/ACT AERS Inhale 2 puffs into the lungs daily. 4 g 10  . Vitamin D, Ergocalciferol, (DRISDOL) 1.25 MG (50000 UT) CAPS capsule TAKE 1 CAPSULE BY MOUTH ONCE A WEEK 12 capsule 1  . zinc gluconate 50 MG tablet Take 50 mg by mouth daily.    Marland Kitchen diltiazem (CARDIZEM CD) 240 MG 24 hr capsule Take 1 capsule (240 mg total) by mouth daily. 30 capsule 3   No current facility-administered medications for this visit.     Allergies:   Erythromycin, Meperidine, Shellfish allergy, Ciprofloxacin, Codeine, Penicillins, and Tape   Social History:  The patient  reports that she quit smoking about 26 years ago. Her smoking use included cigarettes. She has a 20.00 pack-year smoking history. She has never used smokeless tobacco. She reports current alcohol use. She reports that she does not use drugs.   Family History:  The patient's family history includes Alcohol abuse in her brother; Aneurysm in her brother; Angina in her father; Cirrhosis in her maternal grandfather; Colon cancer (age of onset: 52) in her father; Diabetes in her maternal grandmother and mother; Heart attack in her brother and brother; Heart attack (age of onset: 31) in her mother; Heart disease in her brother, brother, and father; Hyperlipidemia in her daughter; Hypertension in her brother and father; Hypothyroidism in her sister; Lung cancer in her father and sister; Obesity in her daughter and son; Stroke in her maternal grandmother and paternal grandmother.    ROS:  Please see the history of present illness.   Otherwise, review of systems is positive for none.   All other systems are reviewed and negative.   PHYSICAL EXAM: VS:  BP 128/72   Pulse (!) 54   Ht 5\' 2"  (1.575 m)   Wt 151 lb 3.2 oz (68.6 kg)   SpO2 93%   BMI 27.65 kg/m  , BMI Body mass  index is 27.65 kg/m. GEN: Well nourished, well developed, in no acute distress  HEENT: normal  Neck: no JVD, carotid  bruits, or masses Cardiac: RRR; no murmurs, rubs, or gallops,no edema  Respiratory:  clear to auscultation bilaterally, normal work of breathing GI: soft, nontender, nondistended, + BS MS: no deformity or atrophy  Skin: warm and dry Neuro:  Strength and sensation are intact Psych: euthymic mood, full affect  EKG:  EKG is ordered today. Personal review of the ekg ordered shows this rhythm, rate 54, inferolateral T wave inversions  Recent Labs: 06/28/2018: ALT 23; TSH 5.44 11/07/2018: BUN 14; Creatinine, Ser 0.98; Potassium 3.7; Sodium 141 12/15/2018: Hemoglobin 13.7; Platelets 185    Lipid Panel     Component Value Date/Time   CHOL 143 06/28/2018 1533   CHOL 253 (H) 03/07/2016 0930   TRIG 278.0 (H) 06/28/2018 1533   HDL 34.60 (L) 06/28/2018 1533   HDL 32 (L) 03/07/2016 0930   CHOLHDL 4 06/28/2018 1533   VLDL 55.6 (H) 06/28/2018 1533   LDLCALC 78 12/01/2017 1404   LDLCALC 183 (H) 03/07/2016 0930   LDLDIRECT 70.0 06/28/2018 1533     Wt Readings from Last 3 Encounters:  03/22/19 151 lb 3.2 oz (68.6 kg)  12/15/18 148 lb (67.1 kg)  12/10/18 146 lb (66.2 kg)      Other studies Reviewed: Additional studies/ records that were reviewed today include: TTE 12/10/17  Review of the above records today demonstrates:  - Left ventricle: The cavity size was normal. Wall thickness was   increased in a pattern of mild LVH. Systolic function was normal.   The estimated ejection fraction was in the range of 55% to 60%.   Wall motion was normal; there were no regional wall motion   abnormalities. - Aortic valve: Valve area (VTI): 1.43 cm^2. Valve area (Vmax):   1.23 cm^2. Valve area (Vmean): 1.24 cm^2. - Mitral valve: There was mild to moderate regurgitation. - Left atrium: The atrium was mildly dilated. - Right atrium: The atrium was moderately to severely dilated. - Tricuspid valve: There was moderate regurgitation. - Pulmonary arteries: PA peak pressure: 49 mm Hg (S).  Holter 01/10/18  - personally reviewed Normal sinus rhythm Rare episodes of SVT/atrial tachycardia, 130 runs longest run 14 beats with rate 138 bpm Frequent PACs, 7%  Rare PVCs, 1% Rare PVCs and bigemeny  ASSESSMENT AND PLAN:  1.  SVT: Study which was negative for inducible tachycardia.  He on diltiazem with occasional episodes.  Sakura Denis increase to 240 mg.  2.  Hypertension: Currently well controlled  3.  Coronary artery disease status post CABG without angina: No current chest pain.  Continue Plavix.  4.  Shortness of breath and cough: I do not see any evidence of volume overload.  She did receive antibiotics and prednisone from her primary physician for possible bronchitis.  We Zahir Eisenhour check a BNP today.  If this is negative, Yazmyn Valbuena refer back to her primary physician.  Current medicines are reviewed at length with the patient today.   The patient does not have concerns regarding her medicines.  The following changes were made today: None  Labs/ tests ordered today include:  Orders Placed This Encounter  Procedures  . Pro b natriuretic peptide (BNP)  . EKG 12-Lead     Disposition:   FU with Jasmin Winberry 6 months  Signed, Monette Omara Meredith Leeds, MD  03/22/2019 11:06 AM     [facility] [address] [place] [address] (  [phone] (office) [phone] (fax)

## 2019-03-23 LAB — PRO B NATRIURETIC PEPTIDE: NT-Pro BNP: 567 pg/mL (ref 0–738)

## 2019-03-25 ENCOUNTER — Telehealth: Payer: Self-pay | Admitting: Internal Medicine

## 2019-03-25 DIAGNOSIS — J449 Chronic obstructive pulmonary disease, unspecified: Secondary | ICD-10-CM

## 2019-03-25 MED ORDER — ALBUTEROL SULFATE (2.5 MG/3ML) 0.083% IN NEBU: 2.5000 mg | INHALATION_SOLUTION | RESPIRATORY_TRACT | 12 refills | Status: DC | PRN

## 2019-03-25 MED ORDER — PREDNISONE 20 MG PO TABS: 40.0000 mg | ORAL_TABLET | Freq: Every day | ORAL | 0 refills | Status: DC

## 2019-03-25 NOTE — Telephone Encounter (Signed)
Pt is aware of recommendations and voiced her understanding. Rx for albuterol and prednisone has been sent to preferred pharmacy.  Order for neb supplies has been sent to [facility] per pt request. Nothing further is needed.

## 2019-03-25 NOTE — Telephone Encounter (Signed)
Spoke to pt, who is requesting PFT results.  Pt stated that she contacted PCP on 03/09/2019 for increased sob. Pt was given cefdinir and prednisone.  Pt completed course of prednisone and abx with no improvement in sx. Pt reports of increased sob with exertion, occ tight "asthmaic cough" and wheezing. Denied fever, chills or sweats.  Pt using albuterol 3-4x daily with mild relief.   Dr. Mortimer Fries please advise. Thanks

## 2019-03-25 NOTE — Telephone Encounter (Signed)
Recommend Prednisone 40 mg daily for 5 days Start Albuterol NEBS every 4 hrs. Need virtual or OV next week

## 2019-03-30 DIAGNOSIS — H903 Sensorineural hearing loss, bilateral: Secondary | ICD-10-CM | POA: Diagnosis not present

## 2019-03-31 ENCOUNTER — Other Ambulatory Visit: Payer: Self-pay | Admitting: Cardiovascular Disease

## 2019-03-31 ENCOUNTER — Telehealth: Payer: Self-pay | Admitting: Internal Medicine

## 2019-03-31 NOTE — Telephone Encounter (Signed)
Called and spoke to Mitchell with [facility], who is requesting Disp and sig for albuterol neb solution. Disp and sig has been given to [facility]. Nothing further is needed.

## 2019-04-03 DIAGNOSIS — G4733 Obstructive sleep apnea (adult) (pediatric): Secondary | ICD-10-CM | POA: Diagnosis not present

## 2019-04-06 DIAGNOSIS — J449 Chronic obstructive pulmonary disease, unspecified: Secondary | ICD-10-CM | POA: Diagnosis not present

## 2019-04-06 NOTE — Telephone Encounter (Signed)
lmtcb

## 2019-04-07 ENCOUNTER — Telehealth: Payer: Self-pay | Admitting: Pulmonary Disease

## 2019-04-07 ENCOUNTER — Telehealth: Payer: Self-pay | Admitting: Cardiology

## 2019-04-07 ENCOUNTER — Encounter: Payer: Self-pay | Admitting: Internal Medicine

## 2019-04-07 ENCOUNTER — Other Ambulatory Visit: Payer: Self-pay

## 2019-04-07 ENCOUNTER — Ambulatory Visit (INDEPENDENT_AMBULATORY_CARE_PROVIDER_SITE_OTHER): Payer: PPO | Admitting: Internal Medicine

## 2019-04-07 DIAGNOSIS — J441 Chronic obstructive pulmonary disease with (acute) exacerbation: Secondary | ICD-10-CM | POA: Diagnosis not present

## 2019-04-07 DIAGNOSIS — J449 Chronic obstructive pulmonary disease, unspecified: Secondary | ICD-10-CM | POA: Diagnosis not present

## 2019-04-07 MED ORDER — PREDNISONE 20 MG PO TABS: 40.0000 mg | ORAL_TABLET | Freq: Every day | ORAL | 0 refills | Status: DC

## 2019-04-07 NOTE — Patient Instructions (Signed)
Chest x-ray two-view Prednisone 40 mg daily for 10 days

## 2019-04-07 NOTE — Telephone Encounter (Signed)
New Message     Pt is calling to speak with Sherri    Please call back

## 2019-04-07 NOTE — Progress Notes (Signed)
PROBLEMS: Former smoker (1/2 PPD x40 years, quit 1994)  COPD with significant asthmatic component. Mild OSA on CPAP  I connected with the patient by telephone enabled telemedicine visit and verified that I am speaking with the correct person using two identifiers.    I discussed the limitations, risks, security and privacy concerns of performing an evaluation and management service by telemedicine and the availability of in-person appointments. I also discussed with the patient that there may be a patient responsible charge related to this service. The patient expressed understanding and agreed to proceed.  PATIENT AGREES AND CONFIRMS -YES   Other persons participating in the visit and their role in the encounter: Patient, nursing  This visit type was conducted due to national recommendations for restrictions regarding the COVID-19 Pandemic (e.g. social distancing).  This format is felt to be most appropriate for this patient at this time.  All issues noted in this document were discussed and addressed.        DATA: PFTs 07/25/15: Mild-mod obstruction. FEV1 1.22 > 1.31 liters (65 > 69% pred) 0/26/11 CT chest: Moderate changes of emphysema predominantly affecting both upper lobes 02/26/15 echocardiogram: LVEF 60-65%.  Grade 1 diastolic dysfunction.  Mild-moderate MR.  LA is mildly dilated.  PA pressures estimated within normal range. 07/25/15 PFTs: FVC: 2.20 > 2.32 L (87 > 92 %pred), FEV1: 1.22 > 1.31 L (65 > 69 %pred), FEV1/FVC: 57%, TLC: 3.81 L (80 %pred), DLCO 79% %pred.  Gas dilution technique probably underestimating TLC 08/11/16 echocardiogram: LVEF 60-65%.  Grade 1 diastolic dysfunction.  Mild-moderate MR.  LA mildly dilated.  RV systolic pressure estimate 57 mmHg.  RA and RV both mildly dilated. 0/18/19 echocardiogram: LVEF 55-60%.  Mild-moderate MR.  LA mildly dilated.  RV systolic pressure estimate 49 mmHg.  Right atrium moderately-severely dilated. 01/18/2018 overnight oximetry:  Lowest SPO2 81%.  Total time less than 88%: 213 minutes (out of 228 minutes total sleep time).  Nocturnal oxygen initiated on the basis of these findings 05/12/18 PSG: AHI 5.1/hr, Lowest SpO2 81%. Recommended AutoSet 5-15 cm H2O 06/16-07/15/20 CPAP compliance: 27/30 nights. Mean pressure 10.7 cm H2O. Max pressure 13.8 cm H2O. AHI 0.3 events/hr  INTERVAL: Last seen 06/22/18.  At that time, she was treated as a COPD exacerbation with improvement in her symptoms.  No major pulmonary events in the interim.  She was diagnosed with new onset atrial fibrillation 11/07/2018   Chief complaint Follow-up COPD Follow osa  HPI Patient with a history of moderate COPD Patient felt ill last couple weeks was given prednisone and antibiotic Patient was advised to take albuterol nebulizers as needed Patient has ongoing increased work of breathing and shortness of breath with wheezing At this time I recommended that she go to the ER for further evaluation She may need IV antibiotics and IV steroids  At this time patient is okay with getting oral prednisone Will obtain chest x-ray two-view to assess lung process    Review of Systems:  Gen:  Denies  fever, sweats, chills weight loss  HEENT: Denies blurred vision, double vision, ear pain, eye pain, hearing loss, nose bleeds, sore throat Cardiac:  No dizziness, chest pain or heaviness, chest tightness,edema, No JVD Resp:   +cough, -sputum production, +shortness of breath,-wheezing, -hemoptysis,  Gi: Denies swallowing difficulty, stomach pain, nausea or vomiting, diarrhea, constipation, bowel incontinence Gu:  Denies bladder incontinence, burning urine Ext:   Denies Joint pain, stiffness or swelling Skin: Denies  skin rash, easy bruising or bleeding or hives Endoc:  Denies polyuria, polydipsia , polyphagia or weight change Psych:   Denies depression, insomnia or hallucinations  Other:  All other systems negative   DATA: BMP Latest Ref Rng & Units  11/07/2018 06/28/2018 03/09/2018  Glucose 70 - 99 mg/dL 101(H) 85 97  BUN 8 - 23 mg/dL 14 20 15   Creatinine 0.44 - 1.00 mg/dL 0.98 0.97 0.88  BUN/Creat Ratio 12 - 28 - - 17  Sodium 135 - 145 mmol/L 141 143 145(H)  Potassium 3.5 - 5.1 mmol/L 3.7 3.7 3.7  Chloride 98 - 111 mmol/L 108 102 105  CO2 22 - 32 mmol/L 25 32 25  Calcium 8.9 - 10.3 mg/dL 9.5 9.3 9.0    CBC Latest Ref Rng & Units 12/15/2018 11/07/2018 06/28/2018  WBC 4.0 - 10.5 K/uL 7.2 7.1 10.9(H)  Hemoglobin 12.0 - 15.0 g/dL 13.7 14.6 14.7  Hematocrit 36.0 - 46.0 % 43.2 45.8 44.4  Platelets 150 - 400 K/uL 185 195 189.0    CXR 06/14: No new film   Moderate COPD Gold stage C Ongoing shortness of breath and wheezing will likely need more prednisone therapy Patient has completed antibiotic therapy Will prescribe prednisone 40 mg daily for another 10 days Obtain a chest x-ray   OSA Continue CPAP as prescribed Patient uses a benefits from CPAP therapy   COVID-19 EDUCATION: The signs and symptoms of COVID-19 were discussed with the patient and how to seek care for testing.  The importance of social distancing was discussed today. Hand Washing Techniques and avoid touching face was advised.     MEDICATION ADJUSTMENTS/LABS AND TESTS ORDERED:  Chest x-ray two-view Prednisone 40 mg daily for 10 days  CURRENT MEDICATIONS REVIEWED AT LENGTH WITH PATIENT TODAY   Patient satisfied with Plan of action and management. All questions answered  Follow up in 6 months  Total time spent 21 minutes  Andrea [name], M.D.  [name]  Medical Director [place] Director [facility] Cardio-Pulmonary Department

## 2019-04-07 NOTE — Telephone Encounter (Addendum)
Pt reports continued issues w/ SOB and nebulizers not working. Advised pt, per [place], to follow up with Dr. Adah Perl.  Advised pt d/t information given above she may want to start w/ pulmonology. Patient verbalized understanding and agreeable to plan.

## 2019-04-07 NOTE — Telephone Encounter (Signed)
Spoke to pt, who reports of dry cough and increased sob.  Pt called in on 03/25/19 and was prescribed prednisone x5d. Pt stated sx have not improved since completed.  Pt has been scheduled for phone visit today with DK at 11:00. Nothing further is needed.

## 2019-04-08 ENCOUNTER — Ambulatory Visit
Admission: RE | Admit: 2019-04-08 | Discharge: 2019-04-08 | Disposition: A | Discharge status: Home / Self Care | Payer: PPO | Source: Ambulatory Visit | Attending: Internal Medicine | Admitting: Internal Medicine

## 2019-04-08 ENCOUNTER — Ambulatory Visit
Admission: RE | Admit: 2019-04-08 | Discharge: 2019-04-08 | Disposition: A | Discharge status: Home / Self Care | Payer: PPO | Attending: Internal Medicine | Admitting: Internal Medicine

## 2019-04-08 ENCOUNTER — Telehealth: Payer: Self-pay | Admitting: Internal Medicine

## 2019-04-08 DIAGNOSIS — J449 Chronic obstructive pulmonary disease, unspecified: Secondary | ICD-10-CM | POA: Diagnosis not present

## 2019-04-08 DIAGNOSIS — J441 Chronic obstructive pulmonary disease with (acute) exacerbation: Secondary | ICD-10-CM | POA: Insufficient documentation

## 2019-04-08 NOTE — Telephone Encounter (Signed)
Pt is aware that appt is not needed for CXR. [facility] is [facility].  Nothing further is needed.

## 2019-04-11 ENCOUNTER — Telehealth: Payer: Self-pay | Admitting: Internal Medicine

## 2019-04-11 NOTE — Telephone Encounter (Signed)
NO SIGNIFICANT ABNORMAL FINDINGS ON CXR

## 2019-04-11 NOTE — Telephone Encounter (Signed)
Spoke to pt, who is requesting CXR from 04/08/2019.  DK please advise. Thanks

## 2019-04-12 ENCOUNTER — Other Ambulatory Visit: Payer: Self-pay

## 2019-04-12 ENCOUNTER — Telehealth: Payer: Self-pay | Admitting: Cardiovascular Disease

## 2019-04-12 ENCOUNTER — Encounter: Payer: Self-pay | Admitting: Cardiovascular Disease

## 2019-04-12 ENCOUNTER — Ambulatory Visit: Payer: PPO | Admitting: Cardiovascular Disease

## 2019-04-12 VITALS — BP 140/76 | HR 56 | Ht 62.0 in | Wt 150.5 lb

## 2019-04-12 DIAGNOSIS — I10 Essential (primary) hypertension: Secondary | ICD-10-CM | POA: Diagnosis not present

## 2019-04-12 DIAGNOSIS — I471 Supraventricular tachycardia: Secondary | ICD-10-CM | POA: Diagnosis not present

## 2019-04-12 DIAGNOSIS — I25810 Atherosclerosis of coronary artery bypass graft(s) without angina pectoris: Secondary | ICD-10-CM

## 2019-04-12 DIAGNOSIS — I5032 Chronic diastolic (congestive) heart failure: Secondary | ICD-10-CM

## 2019-04-12 DIAGNOSIS — R0602 Shortness of breath: Secondary | ICD-10-CM | POA: Diagnosis not present

## 2019-04-12 DIAGNOSIS — I480 Paroxysmal atrial fibrillation: Secondary | ICD-10-CM

## 2019-04-12 MED ORDER — AZITHROMYCIN 250 MG PO TABS: ORAL_TABLET | ORAL | 0 refills | Status: DC

## 2019-04-12 MED ORDER — PREDNISONE 10 MG (21) PO TBPK: ORAL_TABLET | ORAL | 0 refills | Status: AC

## 2019-04-12 NOTE — Telephone Encounter (Signed)
Scheduled from waitlist patient will come today to see Dr. Rockey Situ at [facility]

## 2019-04-12 NOTE — Telephone Encounter (Signed)
Pt is aware of results and voiced her understanding. Nothing further is needed.  

## 2019-04-12 NOTE — Telephone Encounter (Signed)
Left message for pt

## 2019-04-12 NOTE — Progress Notes (Addendum)
Cardiology Office Note  Date:  04/12/2019   ID:  Andrea [name], DOB 18-Dec-1940, MRN [id number]  PCP:  Mosie Lukes, MD   Chief Complaint  Patient presents with  . other    Pt. c/o LE edema, shortness of breath on exertion and wheezing. Meds reviewed by the pt. verbally.     HPI:  Andrea [name] is a pleasant 78 year-old woman with history of  chronic bradycardia,  spinal stenosis,  peripheral vascular disease,  stents to her lower extremities status post CEA on the left, 40-59% residual disease on the right, 10/17 hyperlipidemia,  COPD, long smoking history for at least 30 years coronary artery disease and bypass surgery in 1999,  stent placement to distal RCA may 2013, In August 2017 she had 2 stents placed to vein graft to the RCA, also with stents to the mid left circumflex with resolution of her anginal symptoms cardiac catheterization 06/25/2016 occluded vein graft to the RCA, 60% left ostial subclavian vessel disease Shepresents for routine followup Of her coronary artery disease .  She is part of the [employer] study  Main complaint today is continued COPD exacerbation symptoms Cough, SOB, 03/04/2019 SOB with bending over Been going on for over one month ABX cefdinir and prednisone Has a nebulizer. Started on extra lasix for 3 days Notes reviewed from pulmonary Chest x-ray reviewed with her on today's visit  Having more tachycardia, palpitations Seen by Dr. Curt Bears, diltiazem increased Thinks it might be helping her symptoms  REDS VEST today measured at 30%  EKG personally reviewed by myself on todays visit Shows sinus bradycardia rate 56 bpm diffuse T wave abnormality V3 through V6, inferior leads  Other past medical history reviewe Failed SVT ablation October 2019 Could be not induced  cardiac catheterization 06/25/2016 occluded vein graft to the RCA, 60% left ostial subclavian vessel disease Medical management   Echo 08/11/2016 Normal EF,  moderately elevated RVSP 57 mm Hg, moderate to severe TR  Carotid 10/17 40 to 59% disease on the right, <39% on the left  Denies any significant anginal symptoms previously stopped her Crestor, cholesterol from 144 up to 242 in May 30. 2017 Now back on crestor LDL 64, total chol 126  Cardiac catheterization results 1. Significant underlying three-vessel coronary artery disease with patent LIMA to LAD. The SVG to the RCA is now occluded proximally with left-to-right collaterals. The left circumflex stent is patent with no significant restenosis. 2. Mildly reduced LV systolic function with an EF of 50-55% and inferior wall hypokinesis. Mildly elevated left ventricular end-diastolic pressure. 3. Aortic arch angiogram was done to evaluate the stenosis in the left subclavian artery. It showed 60% heavily calcified stenosis in the ostium of the left subclavian artery. There was about 09-38 mm systolic gradient across the stenosis.  PMH:   has a past medical history of AAA (abdominal aortic aneurysm) ([place]) (01/2009), Acute bronchitis (03/20/2013; 2017), Angina, Anosmia, Asthma, Baker's cyst of knee (01/30/2014), Basal cell carcinoma, Bradycardia, CAD (coronary artery disease), Cerebrovascular disease (01/2009), Chronic thoracic back pain, COPD (chronic obstructive pulmonary disease) ([place]), Depression, Dizziness, Dysrhythmia, GERD (gastroesophageal reflux disease), Heart murmur, Herniated lumbar disc without myelopathy, History of blood transfusion (1999), Hyperglycemia (10/23/2015), Hyperlipidemia, Hypertension, Hypothyroidism (09/30/2016), Medicare annual wellness visit, subsequent (07/31/2013), NSTEMI (non-ST elevated myocardial infarction) ([place]) (11/12), Osteoarthritis of back, Osteoporosis, Pneumonia ("several times"), Pulmonary embolism ([place]) (1999), PVD (peripheral vascular disease) ([place]), Shortness of breath, Squamous carcinoma, Thyroid disease, and Unstable angina ([place]) (09/25/2015).  PSH:    Past  Surgical History:  Procedure Laterality Date  . ABDOMINAL AORTIC ANEURYSM REPAIR     pt denies this hx on 01/02/2016  . BASAL CELL CARCINOMA EXCISION    . CARDIAC CATHETERIZATION N/A 01/02/2016   Procedure: Left Heart Cath and Coronary Angiography;  Surgeon: Wellington Hampshire, MD;  Location: [facility];  Service: Cardiovascular;  Laterality: N/A;  . CARDIAC CATHETERIZATION  1996; 1999  . CARDIAC CATHETERIZATION N/A 06/25/2016   Procedure: Left Heart Cath and Cors/Grafts Angiography;  Surgeon: Wellington Hampshire, MD;  Location: [facility];  Service: Cardiovascular;  Laterality: N/A;  . CAROTID ENDARTERECTOMY Left 01/02/2011  . CATARACT EXTRACTION W/ INTRAOCULAR LENS  IMPLANT, BILATERAL    . CLOSED REDUCTION NASAL FRACTURE  11/2007  . CORONARY ANGIOPLASTY WITH STENT PLACEMENT  10/10/2011   drug eluting  to rc & saphenous  . CORONARY ARTERY BYPASS GRAFT  1999   "CABG X3"  . DILATION AND CURETTAGE OF UTERUS    . FEMORAL ARTERY STENT Bilateral   . FRACTURE SURGERY    . LEFT HEART CATHETERIZATION WITH CORONARY/GRAFT ANGIOGRAM N/A 04/22/2011   Procedure: LEFT HEART CATHETERIZATION WITH Beatrix Fetters;  Surgeon: Jolaine Artist, MD;  Location[facility];  Service: Cardiovascular;  Laterality: N/A;  . LEFT HEART CATHETERIZATION WITH CORONARY/GRAFT ANGIOGRAM N/A 10/10/2011   Procedure: LEFT HEART CATHETERIZATION WITH Beatrix Fetters;  Surgeon: Peter M Martinique, MD;  Location[facility] C[facility];  Service: Cardiovascular;  Laterality: N/A;  . NASAL SINUS SURGERY     twice  . SQUAMOUS CELL CARCINOMA EXCISION    . SVT ABLATION N/A 03/19/2018   Procedure: SVT ABLATION;  Surgeon: Constance Haw, MD;  Location: [facility];  Service: Cardiovascular;  Laterality: N/A;  . TUBAL LIGATION      Current Outpatient Medications  Medication Sig Dispense Refill  . acetaminophen (TYLENOL) 650 MG CR tablet Take 1,300 mg by mouth 2 (two) times daily.    Marland Kitchen albuterol (PROVENTIL  HFA;VENTOLIN HFA) 108 (90 Base) MCG/ACT inhaler INHALE 2 PUFFS INTO THE LUNGS EVERY 6 (SIX) HOURS AS NEEDED FOR WHEEZING. 8.5 Inhaler 1  . albuterol (PROVENTIL) (2.5 MG/3ML) 0.083% nebulizer solution Take 3 mLs (2.5 mg total) by nebulization every 4 (four) hours as needed for wheezing or shortness of breath. Dx:J44.9 75 mL 12  . amLODipine (NORVASC) 5 MG tablet Take 5 mg by mouth daily.    Marland Kitchen azelastine (ASTELIN) 0.1 % nasal spray Place 2 sprays into both nostrils at bedtime as needed for rhinitis. 30 mL 3  . Biotin 5 MG TABS Take 5 mg by mouth daily.     . Calcium Carbonate-Vitamin D (CALCIUM + D PO) Take 1 tablet by mouth daily.     . Cholecalciferol (VITAMIN D3) 2000 units TABS Take 2,000 Units by mouth daily.     . clopidogrel (PLAVIX) 75 MG tablet Take 1 tablet (75 mg total) by mouth daily. 90 tablet 3  . diltiazem (CARDIZEM CD) 240 MG 24 hr capsule Take 1 capsule (240 mg total) by mouth daily. 30 capsule 3  . diltiazem (CARDIZEM) 30 MG tablet Take one tablet by mouth every 4 hours AS NEEDED for A-fib HR > 100 as long as BP > 100. 45 tablet 0  . ezetimibe (ZETIA) 10 MG tablet Take 1 tablet (10 mg total) by mouth daily. NEEDS OFFICE VISIT FOR FURTHER REFILLS 90 tablet 0  . famotidine (PEPCID) 20 MG tablet Take 20 mg by mouth 2 (two) times daily.    Marland Kitchen  fluticasone (FLONASE) 50 MCG/ACT nasal spray Place 2 sprays into both nostrils daily. For congestion 48 g 3  . furosemide (LASIX) 20 MG tablet Take 20 mg by mouth every Monday, Wednesday, and Friday.     Marland Kitchen HYDROcodone-acetaminophen (NORCO) 10-325 MG tablet Take 1 tablet by mouth every 6 (six) hours as needed for severe pain.    . isosorbide mononitrate (IMDUR) 60 MG 24 hr tablet TAKE 1 TABLET (60 MG TOTAL) BY MOUTH 2 (TWO) TIMES DAILY. 180 tablet 3  . Krill Oil 500 MG CAPS Take 500 mg by mouth daily.     Marland Kitchen levothyroxine (SYNTHROID, LEVOTHROID) 50 MCG tablet Take 1 tablet (50 mcg total) by mouth daily. 90 tablet 3  . nitroGLYCERIN (NITROSTAT) 0.4 MG  SL tablet PLACE 1 TABLET (0.4 MG TOTAL) UNDER THE TONGUE EVERY 5 (FIVE) MINUTES AS NEEDED FOR CHEST PAIN. 25 tablet 0  . omeprazole (PRILOSEC) 20 MG capsule TAKE 1 CAPSULE BY MOUTH ONCE DAILY 30 capsule 5  . Polyethyl Glycol-Propyl Glycol (SYSTANE OP) Place 1 drop into both eyes 2 (two) times a day.     . Rivaroxaban (XARELTO) 15 MG TABS tablet Take 1 tablet (15 mg total) by mouth daily with supper. 30 tablet 6  . rosuvastatin (CRESTOR) 40 MG tablet TAKE 1 TABLET BY MOUTH ONCE A DAY 90 tablet 3  . sertraline (ZOLOFT) 50 MG tablet TAKE 1 TABLET BY MOUTH ONCE A DAY 90 tablet 1  . Tiotropium Bromide-Olodaterol (STIOLTO RESPIMAT) 2.5-2.5 MCG/ACT AERS Inhale 2 puffs into the lungs daily. 4 g 10  . Vitamin D, Ergocalciferol, (DRISDOL) 1.25 MG (50000 UT) CAPS capsule TAKE 1 CAPSULE BY MOUTH ONCE A WEEK 12 capsule 1  . zinc gluconate 50 MG tablet Take 50 mg by mouth daily.    Marland Kitchen HYDROcodone-homatropine (HYCODAN) 5-1.5 MG/5ML syrup Take 5 mLs by mouth every 8 (eight) hours as needed for cough. (Patient not taking: Reported on 04/12/2019) 150 mL 0   No current facility-administered medications for this visit.      Allergies:   Erythromycin, Meperidine, Shellfish allergy, Ciprofloxacin, Codeine, Penicillins, and Tape   Social History:  The patient  reports that she quit smoking about 26 years ago. Her smoking use included cigarettes. She has a 20.00 pack-year smoking history. She has never used smokeless tobacco. She reports current alcohol use. She reports that she does not use drugs.   Family History:   family history includes Alcohol abuse in her brother; Aneurysm in her brother; Angina in her father; Cirrhosis in her maternal grandfather; Colon cancer (age of onset: 26) in her father; Diabetes in her maternal grandmother and mother; Heart attack in her brother and brother; Heart attack (age of onset: 81) in her mother; Heart disease in her brother, brother, and father; Hyperlipidemia in her daughter;  Hypertension in her brother and father; Hypothyroidism in her sister; Lung cancer in her father and sister; Obesity in her daughter and son; Stroke in her maternal grandmother and paternal grandmother.    Review of Systems: Review of Systems  Constitutional: Negative.   HENT: Negative.   Respiratory: Positive for shortness of breath.   Cardiovascular: Negative.   Gastrointestinal: Negative.   Musculoskeletal: Negative.   Neurological: Negative.   Psychiatric/Behavioral: Negative.   All other systems reviewed and are negative.    PHYSICAL EXAM: VS:  BP 140/76 (BP Location: Left Arm, Patient Position: Sitting, Cuff Size: Normal)   Pulse (!) 56   Ht 5\' 2"  (1.575 m)   Wt 150 lb  8 oz (68.3 kg)   SpO2 96%   BMI 27.53 kg/m  , BMI Body mass index is 27.53 kg/m. Constitutional:  oriented to person, place, and time. No distress.  HENT:  Head: Grossly normal Eyes:  no discharge. No scleral icterus.  Neck: No JVD, no carotid bruits  Cardiovascular: Regular rate and rhythm, no murmurs appreciated Pulmonary/Chest: Clear to auscultation bilaterally, scattered Rales at the bases Abdominal: Soft.  no distension.  no tenderness.  Musculoskeletal: Normal range of motion Neurological:  normal muscle tone. Coordination normal. No atrophy Skin: Skin warm and dry Psychiatric: normal affect, pleasant   Recent Labs: 06/28/2018: ALT 23; TSH 5.44 11/07/2018: BUN 14; Creatinine, Ser 0.98; Potassium 3.7; Sodium 141 12/15/2018: Hemoglobin 13.7; Platelets 185 03/22/2019: NT-Pro BNP 567    Lipid Panel Lab Results  Component Value Date   CHOL 143 06/28/2018   HDL 34.60 (L) 06/28/2018   LDLCALC 78 12/01/2017   TRIG 278.0 (H) 06/28/2018      Wt Readings from Last 3 Encounters:  04/12/19 150 lb 8 oz (68.3 kg)  03/22/19 151 lb 3.2 oz (68.6 kg)  12/15/18 148 lb (67.1 kg)     ASSESSMENT AND PLAN:  COPD exacerbation Reports that she is not getting any better over the past month Significant  thick bronchospastic cough in the office concerning for ongoing infection and COPD exacerbation We have called in prednisone taper, Z-Pak She will follow up with Dr. Mortimer Fries  Mixed hyperlipidemia Zetia Crestor goal LDL preferably less than 60  Number slightly above goal  Essential hypertension Blood pressure is well controlled on today's visit. No changes made to the medications.  SVT Followed by  EP in [place] Denies having significant symptoms since diltiazem was increased recently  Coronary artery disease involving coronary bypass graft of native heart with angina pectoris (HCC)  no anginal symptom  We have not ordered any other testing  Bilateral carotid artery stenosis Endarterectomy on the left, 40-50% disease on the right Continue aggressive lipid management  Pulmonary HTN underlying lung disease, 30 years of smoking Prior echo with mild to moderate pulmonary hypertension On Lasix  Non-rheumatic tricuspid valve insufficiency Moderate TR on echo   Disposition:   F/U  12 months   Total encounter time more than 25 minutes  Greater than 50% was spent in counseling and coordination of care with the patient    No orders of the defined types were placed in this encounter.    Signed, Esmond Plants, M.D., Ph.D. 04/12/2019  [place], [place]

## 2019-04-12 NOTE — Telephone Encounter (Signed)
Patient seen in office today by provider.

## 2019-04-12 NOTE — Patient Instructions (Addendum)
Prednisone taper 60 mg x 3 days, 40 mg x 3 days, 30 mg x 3 days, 20 mg x 3 days, 10 mg x 3 days   Medication Instructions:  Your physician has recommended you make the following change in your medication:  1. START Prednisone taper as follows: 60 mg for 3 days 40 mg for 3 days 30 mg for 3 days 20 mg for 3 days 10 mg for 3 days 2. START Zithromax pack take 2 tablets the first day and then one tablet daily until gone.    If you need a refill on your cardiac medications before your next appointment, please call your pharmacy.    Lab work: No new labs needed   If you have labs (blood work) drawn today and your tests are completely normal, you will receive your results only by: Marland Kitchen MyChart Message (if you have MyChart) OR . A paper copy in the mail If you have any lab test that is abnormal or we need to change your treatment, we will call you to review the results.   Testing/Procedures: No new testing needed   Follow-Up: At [facility], you and your health needs are our priority.  As part of our continuing mission to provide you with exceptional heart care, we have created designated Provider Care Teams.  These Care Teams include your primary Cardiologist (physician) and Advanced Practice Providers (APPs -  Physician Assistants and Nurse Practitioners) who all work together to provide you with the care you need, when you need it.  . You will need a follow up appointment in 6 months   . Providers on your designated Care Team:   . Murray Hodgkins, NP . Christell Faith, PA-C . Marrianne Mood, PA-C  Any Other Special Instructions Will Be Listed Below (If Applicable).  For educational health videos Log in to : [email or web address] Or : [email or web address], password : [email or web address]

## 2019-04-12 NOTE — Telephone Encounter (Signed)
Pt c/o Shortness Of Breath: STAT if SOB developed within the last 24 hours or pt is noticeably SOB on the phone  1. Are you currently SOB (can you hear that pt is SOB on the phone)? yes  2. How long have you been experiencing SOB? About 4 weeks   3. Are you SOB when sitting or when up moving around? Comes and goes gets worse with exertion and bending down recently increased lasix by Dr. Curt Bears for fluid retention for 3 days and no change or improvement noticed   4. Are you currently experiencing any other symptoms? Dry cough

## 2019-04-13 ENCOUNTER — Other Ambulatory Visit: Payer: Self-pay | Admitting: Otolaryngology

## 2019-04-13 DIAGNOSIS — G4733 Obstructive sleep apnea (adult) (pediatric): Secondary | ICD-10-CM | POA: Diagnosis not present

## 2019-04-13 DIAGNOSIS — D333 Benign neoplasm of cranial nerves: Secondary | ICD-10-CM

## 2019-04-19 ENCOUNTER — Ambulatory Visit: Payer: PPO | Admitting: Nurse Practitioner

## 2019-04-28 ENCOUNTER — Encounter: Payer: PPO | Admitting: Family Medicine

## 2019-04-30 ENCOUNTER — Other Ambulatory Visit: Payer: Self-pay | Admitting: Pulmonary Disease

## 2019-05-02 NOTE — Telephone Encounter (Signed)
DK please advise on refill.  Medication is not mentioned in last OV note. Thanks

## 2019-05-03 ENCOUNTER — Other Ambulatory Visit: Payer: Self-pay

## 2019-05-03 ENCOUNTER — Ambulatory Visit
Admission: RE | Admit: 2019-05-03 | Discharge: 2019-05-03 | Disposition: A | Discharge status: Home / Self Care | Payer: PPO | Source: Ambulatory Visit | Attending: Otolaryngology | Admitting: Otolaryngology

## 2019-05-03 DIAGNOSIS — H8391 Unspecified disease of right inner ear: Secondary | ICD-10-CM | POA: Diagnosis not present

## 2019-05-03 DIAGNOSIS — D333 Benign neoplasm of cranial nerves: Secondary | ICD-10-CM

## 2019-05-03 DIAGNOSIS — G4733 Obstructive sleep apnea (adult) (pediatric): Secondary | ICD-10-CM | POA: Diagnosis not present

## 2019-05-03 MED ORDER — GADOBENATE DIMEGLUMINE 529 MG/ML IV SOLN
13.0000 mL | Freq: Once | INTRAVENOUS | Status: AC | PRN
  Administered 2019-05-03: 13 mL via INTRAVENOUS

## 2019-05-17 ENCOUNTER — Telehealth: Payer: Self-pay | Admitting: Cardiovascular Disease

## 2019-05-17 NOTE — Telephone Encounter (Signed)
Pt c/o medication issue:  1. Name of Medication: diltiazem   2. How are you currently taking this medication (dosage and times per day)? Please advise   3. Are you having a reaction (difficulty breathing--STAT)? no  4. What is your medication issue? Patient states she is taking for afib and wants to know how often to take

## 2019-05-17 NOTE — Telephone Encounter (Signed)
Spoke with pt, advised Diltiazem 240mg  capsule should be taken once daily to help prevent AFIB episode.  Diltiazem 30mg  tablet is to be taken every 4 hours as needed for breakthrough AFIB when HR >100 as long as SBP also >100.

## 2019-05-31 ENCOUNTER — Other Ambulatory Visit: Payer: Self-pay | Admitting: Family Medicine

## 2019-05-31 ENCOUNTER — Other Ambulatory Visit (HOSPITAL_COMMUNITY): Payer: Self-pay | Admitting: Physician Assistant

## 2019-05-31 ENCOUNTER — Other Ambulatory Visit: Payer: Self-pay | Admitting: Cardiovascular Disease

## 2019-06-03 DIAGNOSIS — G4733 Obstructive sleep apnea (adult) (pediatric): Secondary | ICD-10-CM | POA: Diagnosis not present

## 2019-06-10 DIAGNOSIS — H9113 Presbycusis, bilateral: Secondary | ICD-10-CM | POA: Diagnosis not present

## 2019-06-10 DIAGNOSIS — Z7289 Other problems related to lifestyle: Secondary | ICD-10-CM | POA: Diagnosis not present

## 2019-06-10 DIAGNOSIS — Z87891 Personal history of nicotine dependence: Secondary | ICD-10-CM | POA: Diagnosis not present

## 2019-06-10 DIAGNOSIS — D333 Benign neoplasm of cranial nerves: Secondary | ICD-10-CM | POA: Diagnosis not present

## 2019-06-10 DIAGNOSIS — Z974 Presence of external hearing-aid: Secondary | ICD-10-CM | POA: Diagnosis not present

## 2019-06-13 ENCOUNTER — Ambulatory Visit (INDEPENDENT_AMBULATORY_CARE_PROVIDER_SITE_OTHER): Payer: Medicare HMO | Admitting: Family Medicine

## 2019-06-13 ENCOUNTER — Encounter: Payer: Self-pay | Admitting: Family Medicine

## 2019-06-13 ENCOUNTER — Other Ambulatory Visit: Payer: Self-pay

## 2019-06-13 VITALS — BP 120/66 | HR 74 | Temp 98.6°F | Resp 18 | Ht 62.0 in | Wt 150.8 lb

## 2019-06-13 DIAGNOSIS — R739 Hyperglycemia, unspecified: Secondary | ICD-10-CM

## 2019-06-13 DIAGNOSIS — R059 Cough, unspecified: Secondary | ICD-10-CM

## 2019-06-13 DIAGNOSIS — E039 Hypothyroidism, unspecified: Secondary | ICD-10-CM

## 2019-06-13 DIAGNOSIS — E559 Vitamin D deficiency, unspecified: Secondary | ICD-10-CM | POA: Insufficient documentation

## 2019-06-13 DIAGNOSIS — R05 Cough: Secondary | ICD-10-CM

## 2019-06-13 DIAGNOSIS — I25709 Atherosclerosis of coronary artery bypass graft(s), unspecified, with unspecified angina pectoris: Secondary | ICD-10-CM | POA: Diagnosis not present

## 2019-06-13 DIAGNOSIS — R058 Other specified cough: Secondary | ICD-10-CM

## 2019-06-13 DIAGNOSIS — Z0000 Encounter for general adult medical examination without abnormal findings: Secondary | ICD-10-CM

## 2019-06-13 DIAGNOSIS — J449 Chronic obstructive pulmonary disease, unspecified: Secondary | ICD-10-CM | POA: Diagnosis not present

## 2019-06-13 DIAGNOSIS — F339 Major depressive disorder, recurrent, unspecified: Secondary | ICD-10-CM | POA: Diagnosis not present

## 2019-06-13 DIAGNOSIS — Z0001 Encounter for general adult medical examination with abnormal findings: Secondary | ICD-10-CM

## 2019-06-13 DIAGNOSIS — E782 Mixed hyperlipidemia: Secondary | ICD-10-CM

## 2019-06-13 DIAGNOSIS — I10 Essential (primary) hypertension: Secondary | ICD-10-CM

## 2019-06-13 MED ORDER — ALBUTEROL SULFATE HFA 108 (90 BASE) MCG/ACT IN AERS: INHALATION_SPRAY | RESPIRATORY_TRACT | 5 refills | Status: DC

## 2019-06-13 MED ORDER — SERTRALINE HCL 100 MG PO TABS: 100.0000 mg | ORAL_TABLET | Freq: Every day | ORAL | 3 refills | Status: DC

## 2019-06-13 MED ORDER — METHYLPREDNISOLONE 4 MG PO TABS: ORAL_TABLET | ORAL | 0 refills | Status: DC

## 2019-06-13 MED ORDER — ALBUTEROL SULFATE HFA 108 (90 BASE) MCG/ACT IN AERS: INHALATION_SPRAY | RESPIRATORY_TRACT | 2 refills | Status: DC

## 2019-06-13 NOTE — Patient Instructions (Addendum)
Pulse oximeter want oxygen in 90s  Multivitamin with minerals, selenium, zinc, vitamin c and d Vitamin 05-1998 IU daily Probiotic daily  Melatonin 2-5 mg at bedtime   Preventive Care 81 Years and Older, Female Preventive care refers to lifestyle choices and visits with your health care provider that can promote health and wellness. This includes:  A yearly physical exam. This is also called an annual well check.  Regular dental and eye exams.  Immunizations.  Screening for certain conditions.  Healthy lifestyle choices, such as diet and exercise. What can I expect for my preventive care visit? Physical exam Your health care provider will check:  Height and weight. These may be used to calculate body mass index (BMI), which is a measurement that tells if you are at a healthy weight.  Heart rate and blood pressure.  Your skin for abnormal spots. Counseling Your health care provider may ask you questions about:  Alcohol, tobacco, and drug use.  Emotional well-being.  Home and relationship well-being.  Sexual activity.  Eating habits.  History of falls.  Memory and ability to understand (cognition).  Work and work Statistician.  Pregnancy and menstrual history. What immunizations do I need?  Influenza (flu) vaccine  This is recommended every year. Tetanus, diphtheria, and pertussis (Tdap) vaccine  You may need a Td booster every 10 years. Varicella (chickenpox) vaccine  You may need this vaccine if you have not already been vaccinated. Zoster (shingles) vaccine  You may need this after age 45. Pneumococcal conjugate (PCV13) vaccine  One dose is recommended after age 81. Pneumococcal polysaccharide (PPSV23) vaccine  One dose is recommended after age 81. Measles, mumps, and rubella (MMR) vaccine  You may need at least one dose of MMR if you were born in 1957 or later. You may also need a second dose. Meningococcal conjugate (MenACWY) vaccine  You  may need this if you have certain conditions. Hepatitis A vaccine  You may need this if you have certain conditions or if you travel or work in places where you may be exposed to hepatitis A. Hepatitis B vaccine  You may need this if you have certain conditions or if you travel or work in places where you may be exposed to hepatitis B. Haemophilus influenzae type b (Hib) vaccine  You may need this if you have certain conditions. You may receive vaccines as individual doses or as more than one vaccine together in one shot (combination vaccines). Talk with your health care provider about the risks and benefits of combination vaccines. What tests do I need? Blood tests  Lipid and cholesterol levels. These may be checked every 5 years, or more frequently depending on your overall health.  Hepatitis C test.  Hepatitis B test. Screening  Lung cancer screening. You may have this screening every year starting at age 81 if you have a 30-pack-year history of smoking and currently smoke or have quit within the past 15 years.  Colorectal cancer screening. All adults should have this screening starting at age 81 and continuing until age 34. Your health care provider may recommend screening at age 81 if you are at increased risk. You will have tests every 1-10 years, depending on your results and the type of screening test.  Diabetes screening. This is done by checking your blood sugar (glucose) after you have not eaten for a while (fasting). You may have this done every 1-3 years.  Mammogram. This may be done every 1-2 years. Talk with your health care  provider about how often you should have regular mammograms.  BRCA-related cancer screening. This may be done if you have a family history of breast, ovarian, tubal, or peritoneal cancers. Other tests  Sexually transmitted disease (STD) testing.  Bone density scan. This is done to screen for osteoporosis. You may have this done starting at age  81. Follow these instructions at home: Eating and drinking  Eat a diet that includes fresh fruits and vegetables, whole grains, lean protein, and low-fat dairy products. Limit your intake of foods with high amounts of sugar, saturated fats, and salt.  Take vitamin and mineral supplements as recommended by your health care provider.  Do not drink alcohol if your health care provider tells you not to drink.  If you drink alcohol: ? Limit how much you have to 0-1 drink a day. ? Be aware of how much alcohol is in your drink. In the [place] one drink equals one 12 oz bottle of beer (355 mL), one 5 oz glass of wine (148 mL), or one 1 oz glass of hard liquor (44 mL). Lifestyle  Take daily care of your teeth and gums.  Stay active. Exercise for at least 30 minutes on 5 or more days each week.  Do not use any products that contain nicotine or tobacco, such as cigarettes, e-cigarettes, and chewing tobacco. If you need help quitting, ask your health care provider.  If you are sexually active, practice safe sex. Use a condom or other form of protection in order to prevent STIs (sexually transmitted infections).  Talk with your health care provider about taking a low-dose aspirin or statin. What's next?  Go to your health care provider once a year for a well check visit.  Ask your health care provider how often you should have your eyes and teeth checked.  Stay up to date on all vaccines. This information is not intended to replace advice given to you by your health care provider. Make sure you discuss any questions you have with your health care provider. Document Revised: 05/06/2018 Document Reviewed: 05/06/2018 Elsevier Patient Education  2020 [employer].

## 2019-06-13 NOTE — Progress Notes (Signed)
Subjective:    [name], female    DOB: 1941-01-25, 79 y.o.   MRN: [id number]  Chief Complaint  Patient presents with  . Annual Exam    HPI Patient is in today for annual preventative exam and follow up on chronic medical concerns. She is denying any recent febrile illness or hospitalizations. She is struggling with increased stress caring for her husband at home who is frequently very mean. She is not in danger nor does she worry about physical harm. No polyuria or polydipsia. She is maintaining quarantine well and tries to maintain a heart healthy diet. She is noting increased trouble with sOB and fatigue. Denies CP/palp/HA/congestion/fevers/GI or GU c/o. Taking meds as prescribed  Past Medical History:  Diagnosis Date  . AAA (abdominal aortic aneurysm) ([place]) 01/2009   AAA (2.8 x 3.0)  moderate RAS (left); 2.7 x 2.7 cm (07/11/05)  . Acute bronchitis 03/20/2013; 2017  . Angina   . Anosmia   . Asthma   . Baker's cyst of knee 01/30/2014  . Basal cell carcinoma    "back and left arm"  . Bradycardia    Metoprolol stopped 08/2011  . CAD (coronary artery disease)    s/CABG (reports IMA and 2 SVGs) back in 1999  Myoview normal 3/10; s/p PCI with DES to PL branch of distal RCA 09/2011; PCI +DES to SVG-RCA, PCI + DES to mid LCx 12/2015  . Cerebrovascular disease 01/2009   carotid u/s  R 0-39%   L 60-79%  . Chronic thoracic back pain   . COPD (chronic obstructive pulmonary disease) ([place])   . Depression   . Dizziness   . Dysrhythmia    hx of sinus brady  . GERD (gastroesophageal reflux disease)   . Heart murmur   . Herniated lumbar disc without myelopathy   . History of blood transfusion 1999   "when I had the bypass; had a PE"  . Hyperglycemia 10/23/2015  . Hyperlipidemia   . Hypertension   . Hypothyroidism 09/30/2016  . Medicare annual wellness visit, subsequent 07/31/2013  . NSTEMI (non-ST elevated myocardial infarction) ([place]) 11/12   Cath showed atretic IMA graft to the  LAD, SVG to PD was patent but the continuation of this graft to the PL branch was occluded; there were L to R collaterals; Mid LAD had a 60 to 70% stenosis. She has been treated medically.  Neg Myoview 05/2011  . Osteoarthritis of back   . Osteoporosis   . Pneumonia "several times"  . Pulmonary embolism ([place]) 1999   "after my bypass"  . PVD (peripheral vascular disease) ([place])   . Shortness of breath   . Squamous carcinoma    "nose"  . Thyroid disease    Hypothyroid  . Unstable angina ([place]) 09/25/2015    Past Surgical History:  Procedure Laterality Date  . ABDOMINAL AORTIC ANEURYSM REPAIR     pt denies this hx on 01/02/2016  . BASAL CELL CARCINOMA EXCISION    . CARDIAC CATHETERIZATION N/A 01/02/2016   Procedure: Left Heart Cath and Coronary Angiography;  Surgeon: Wellington Hampshire, MD;  Location: [facility];  Service: Cardiovascular;  Laterality: N/A;  . CARDIAC CATHETERIZATION  1996; 1999  . CARDIAC CATHETERIZATION N/A 06/25/2016   Procedure: Left Heart Cath and Cors/Grafts Angiography;  Surgeon: Wellington Hampshire, MD;  Location: [facility];  Service: Cardiovascular;  Laterality: N/A;  . CAROTID ENDARTERECTOMY Left 01/02/2011  . CATARACT EXTRACTION W/ INTRAOCULAR LENS  IMPLANT, BILATERAL    .  CLOSED REDUCTION NASAL FRACTURE  11/2007  . CORONARY ANGIOPLASTY WITH STENT PLACEMENT  10/10/2011   drug eluting  to rc & saphenous  . CORONARY ARTERY BYPASS GRAFT  1999   "CABG X3"  . DILATION AND CURETTAGE OF UTERUS    . FEMORAL ARTERY STENT Bilateral   . FRACTURE SURGERY    . LEFT HEART CATHETERIZATION WITH CORONARY/GRAFT ANGIOGRAM N/A 04/22/2011   Procedure: LEFT HEART CATHETERIZATION WITH Beatrix Fetters;  Surgeon: Jolaine Artist, MD;  Location: [facility] C[facility];  Service: Cardiovascular;  Laterality: N/A;  . LEFT HEART CATHETERIZATION WITH CORONARY/GRAFT ANGIOGRAM N/A 10/10/2011   Procedure: LEFT HEART CATHETERIZATION WITH Beatrix Fetters;  Surgeon: Peter M Martinique,  MD;  Location: [facility] CATH LAB;  Service: Cardiovascular;  Laterality: N/A;  . NASAL SINUS SURGERY     twice  . SQUAMOUS CELL CARCINOMA EXCISION    . SVT ABLATION N/A 03/19/2018   Procedure: SVT ABLATION;  Surgeon: Constance Haw, MD;  Location: [facility];  Service: Cardiovascular;  Laterality: N/A;  . TUBAL LIGATION      Family History  Problem Relation Age of Onset  . Heart attack Mother 55  . Diabetes Mother   . Colon cancer Father 56  . Lung cancer Father        smoked  . Heart disease Father   . Hypertension Father   . Angina Father   . Lung cancer Sister        smoked  . Hypothyroidism Sister   . Hypertension Brother   . Heart attack Brother   . Heart disease Brother        PTCA with Stent  . Heart attack Brother        CABG  . Heart disease Brother        CABG with 1 bypass  . Aneurysm Brother        brain  . Alcohol abuse Brother   . Hyperlipidemia Daughter   . Diabetes Maternal Grandmother   . Stroke Maternal Grandmother   . Cirrhosis Maternal Grandfather   . Stroke Paternal Grandmother   . Obesity Daughter   . Obesity Son     Social History   Socioeconomic History  . Marital status: Married    Spouse name: Not on file  . Number of children: 5  . Years of education: Not on file  . Highest education level: Not on file  Occupational History  . 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 Smoking status: Former Smoker    Packs/day: 0.50    Years: 40.00    Pack years: 20.00    Types: Cigarettes    Quit date: 01/19/1993    Years since quitting: 26.4  . Smokeless tobacco: Never Used  Substance and Sexual Activity  . Alcohol use: Yes    Comment: 01/02/2016 "might drink a glass of wine socially q 3-4 months"  . Drug use: No  . Sexual activity: Not Currently    Birth control/protection: Post-menopausal  Other Topics Concern  . Not on file  Social History Narrative   Married with 5 children   Social  Determinants of Health   Financial Resource Strain:   . Difficulty of Paying Living Expenses: Not on file  Food Insecurity:   . Worried About Charity fundraiser in the Last Year: Not on file  . Ran Out of Food in the Last Year: Not on file  Transportation Needs:   . Film/video editor (Medical): Not on file  . Lack of Transportation (Non-Medical): Not on file  Physical Activity:   . Days of Exercise per Week: Not on file  . Minutes of Exercise per Session: Not on file  Stress:   . Feeling of Stress : Not on file  Social Connections:   . Frequency of Communication with Friends and Family: Not on file  . Frequency of Social Gatherings with Friends and Family: Not on file  . Attends Religious Services: Not on file  . Active Member of Clubs or Organizations: Not on file  . Attends Archivist Meetings: Not on file  . Marital Status: Not on file  Intimate Partner Violence:   . Fear of Current or Ex-Partner: Not on file  . Emotionally Abused: Not on file  . Physically Abused: Not on file  . Sexually Abused: Not on file    Outpatient Medications Prior to Visit  Medication Sig Dispense Refill  . acetaminophen (TYLENOL) 650 MG CR tablet Take 1,300 mg by mouth 2 (two) times daily.    Marland Kitchen albuterol (PROVENTIL) (2.5 MG/3ML) 0.083% nebulizer solution Take 3 mLs (2.5 mg total) by nebulization every 4 (four) hours as needed for wheezing or shortness of breath. Dx:J44.9 75 mL 12  . amLODipine (NORVASC) 5 MG tablet TAKE 1 TABLET BY MOUTH ONCE DAILY 90 tablet 3  . azelastine (ASTELIN) 0.1 % nasal spray Place 2 sprays into both nostrils at bedtime as needed for rhinitis. 30 mL 3  . Biotin 5 MG TABS Take 5 mg by mouth daily.     . Calcium Carbonate-Vitamin D (CALCIUM + D PO) Take 1 tablet by mouth daily.     . Cholecalciferol (VITAMIN D3) 2000 units TABS Take 2,000 Units by mouth daily.     . clopidogrel (PLAVIX) 75 MG tablet Take 1 tablet (75 mg total) by mouth daily. 90 tablet 3  .  diltiazem (CARDIZEM CD) 240 MG 24 hr capsule Take 1 capsule (240 mg total) by mouth daily. 30 capsule 3  . diltiazem (CARDIZEM) 30 MG tablet Take one tablet by mouth every 4 hours AS NEEDED for A-fib HR > 100 as long as BP > 100. 45 tablet 0  . ezetimibe (ZETIA) 10 MG tablet Take 1 tablet (10 mg total) by mouth daily. NEEDS OFFICE VISIT FOR FURTHER REFILLS 90 tablet 0  . famotidine (PEPCID) 20 MG tablet Take 20 mg by mouth 2 (two) times daily.    . fluticasone (FLONASE) 50 MCG/ACT nasal spray Place 2 sprays into both nostrils daily. For congestion 48 g 3  . furosemide (LASIX) 20 MG tablet Take 20 mg by mouth every Monday, Wednesday, and Friday.     Marland Kitchen HYDROcodone-acetaminophen (NORCO) 10-325 MG tablet Take 1 tablet by mouth every 6 (six) hours as needed for severe pain.    . isosorbide mononitrate (IMDUR) 60 MG 24 hr tablet TAKE 1 TABLET BY MOUTH TWICE A DAY 180 tablet 3  . Krill Oil 500 MG CAPS Take 500 mg by mouth daily.     Marland Kitchen levothyroxine (SYNTHROID, LEVOTHROID) 50 MCG tablet Take 1 tablet (50 mcg total) by mouth daily. 90 tablet 3  . nitroGLYCERIN (NITROSTAT) 0.4 MG SL tablet PLACE 1 TABLET (0.4 MG TOTAL) UNDER THE TONGUE EVERY 5 (FIVE) MINUTES AS NEEDED FOR CHEST PAIN. 25 tablet 0  . omeprazole (PRILOSEC) 20 MG capsule TAKE 1 CAPSULE BY MOUTH ONCE DAILY 30 capsule 5  . Polyethyl Glycol-Propyl Glycol (  SYSTANE OP) Place 1 drop into both eyes 2 (two) times a day.     . rosuvastatin (CRESTOR) 40 MG tablet TAKE 1 TABLET BY MOUTH ONCE A DAY 90 tablet 3  . STIOLTO RESPIMAT 2.5-2.5 MCG/ACT AERS INHALE 2 PUFFS INTO THE LUNGS ONCE DAILY 4 g 10  . Vitamin D, Ergocalciferol, (DRISDOL) 1.25 MG (50000 UT) CAPS capsule TAKE 1 CAPSULE BY MOUTH ONCE A WEEK 12 capsule 1  . XARELTO 15 MG TABS tablet TAKE 1 TABLET BY MOUTH ONCE A DAY WITH SUPPER 30 tablet 6  . zinc gluconate 50 MG tablet Take 50 mg by mouth daily.    Marland Kitchen albuterol (PROVENTIL HFA;VENTOLIN HFA) 108 (90 Base) MCG/ACT inhaler INHALE 2 PUFFS INTO THE  LUNGS EVERY 6 (SIX) HOURS AS NEEDED FOR WHEEZING. 8.5 Inhaler 1  . azithromycin (ZITHROMAX Z-PAK) 250 MG tablet Two the first day then one a day 6 each 0  . HYDROcodone-homatropine (HYCODAN) 5-1.5 MG/5ML syrup Take 5 mLs by mouth every 8 (eight) hours as needed for cough. (Patient not taking: Reported on 04/12/2019) 150 mL 0  . sertraline (ZOLOFT) 50 MG tablet TAKE 1 TABLET BY MOUTH ONCE DAILY 90 tablet 1   No facility-administered medications prior to visit.    Allergies  Allergen Reactions  . Erythromycin Other (See Comments)    Tongue burns  . Meperidine Nausea And Vomiting  . Shellfish Allergy Nausea And Vomiting  . Ciprofloxacin Rash and Other (See Comments)    Rash from IV  . Codeine Nausea And Vomiting  . Penicillins Rash and Other (See Comments)    Has patient had a PCN reaction causing immediate rash, facial/tongue/throat swelling, SOB or lightheadedness with hypotension:unsure Has patient had a PCN reaction causing severe rash involving mucus membranes or skin necrosis:No Has patient had a PCN reaction that required hospitalization:No Has patient had a PCN reaction occurring within the last 10 years:No If all of the above answers are "NO", then may proceed with Cephalosporin use.   . Tape Rash    pls use paper tape    Review of Systems  Constitutional: Positive for malaise/fatigue. Negative for chills and fever.  HENT: Negative for congestion and hearing loss.   Eyes: Negative for discharge.  Respiratory: Positive for shortness of breath. Negative for cough and sputum production.   Cardiovascular: Negative for chest pain, palpitations and leg swelling.  Gastrointestinal: Negative for abdominal pain, blood in stool, constipation, diarrhea, heartburn, nausea and vomiting.  Genitourinary: Negative for dysuria, frequency, hematuria and urgency.  Musculoskeletal: Positive for myalgias. Negative for back pain and falls.  Skin: Negative for rash.  Neurological: Negative for  dizziness, sensory change, loss of consciousness, weakness and headaches.  Endo/Heme/Allergies: Negative for environmental allergies. Does not bruise/bleed easily.  Psychiatric/Behavioral: Positive for depression. Negative for suicidal ideas. The patient is nervous/anxious. The patient does not have insomnia.        Objective:    Physical Exam Constitutional:      General: She is not in acute distress.    Appearance: She is not diaphoretic.  HENT:     Head: Normocephalic and atraumatic.     Right Ear: External ear normal.     Left Ear: External ear normal.     Nose: Nose normal.     Mouth/Throat:     Pharynx: No oropharyngeal exudate.  Eyes:     General: No scleral icterus.       Right eye: No discharge.        Left eye:  No discharge.     Conjunctiva/sclera: Conjunctivae normal.     Pupils: Pupils are equal, round, and reactive to light.  Neck:     Thyroid: No thyromegaly.  Cardiovascular:     Rate and Rhythm: Normal rate. Rhythm irregular.     Heart sounds: Normal heart sounds. No murmur.  Pulmonary:     Effort: Pulmonary effort is normal. No respiratory distress.     Breath sounds: Normal breath sounds. No wheezing or rales.  Abdominal:     General: Bowel sounds are normal. There is no distension.     Palpations: Abdomen is soft. There is no mass.     Tenderness: There is no abdominal tenderness.  Musculoskeletal:        General: No tenderness. Normal range of motion.     Cervical back: Normal range of motion and neck supple.  Lymphadenopathy:     Cervical: No cervical adenopathy.  Skin:    General: Skin is warm and dry.     Findings: No rash.  Neurological:     Mental Status: She is alert and oriented to person, place, and time.     Cranial Nerves: No cranial nerve deficit.     Coordination: Coordination normal.     Deep Tendon Reflexes: Reflexes are normal and symmetric. Reflexes normal.     BP 120/66 (BP Location: Left Arm, Patient Position: Sitting, Cuff  Size: Normal)   Pulse 74   Temp 98.6 F (37 C) (Temporal)   Resp 18   Ht 5\' 2"  (1.575 m)   Wt 150 lb 12.8 oz (68.4 kg)   SpO2 97%   BMI 27.58 kg/m  Wt Readings from Last 3 Encounters:  06/13/19 150 lb 12.8 oz (68.4 kg)  04/12/19 150 lb 8 oz (68.3 kg)  03/22/19 151 lb 3.2 oz (68.6 kg)    Diabetic Foot Exam - Simple   No data filed     Lab Results  Component Value Date   WBC 7.2 12/15/2018   HGB 13.7 12/15/2018   HCT 43.2 12/15/2018   PLT 185 12/15/2018   GLUCOSE 101 (H) 11/07/2018   CHOL 143 06/28/2018   TRIG 278.0 (H) 06/28/2018   HDL 34.60 (L) 06/28/2018   LDLDIRECT 70.0 06/28/2018   LDLCALC 78 12/01/2017   ALT 23 06/28/2018   AST 14 06/28/2018   NA 141 11/07/2018   K 3.7 11/07/2018   CL 108 11/07/2018   CREATININE 0.98 11/07/2018   BUN 14 11/07/2018   CO2 25 11/07/2018   TSH 5.44 (H) 06/28/2018   INR 1.0 06/17/2016   HGBA1C 6.2 06/28/2018   MICROALBUR 6.9 (H) 12/01/2017    Lab Results  Component Value Date   TSH 5.44 (H) 06/28/2018   Lab Results  Component Value Date   WBC 7.2 12/15/2018   HGB 13.7 12/15/2018   HCT 43.2 12/15/2018   MCV 84.4 12/15/2018   PLT 185 12/15/2018   Lab Results  Component Value Date   NA 141 11/07/2018   K 3.7 11/07/2018   CO2 25 11/07/2018   GLUCOSE 101 (H) 11/07/2018   BUN 14 11/07/2018   CREATININE 0.98 11/07/2018   BILITOT 0.5 06/28/2018   ALKPHOS 92 06/28/2018   AST 14 06/28/2018   ALT 23 06/28/2018   PROT 6.5 06/28/2018   ALBUMIN 4.1 06/28/2018   CALCIUM 9.5 11/07/2018   ANIONGAP 8 11/07/2018   GFR 55.36 (L) 06/28/2018   Lab Results  Component Value Date   CHOL 143 06/28/2018  Lab Results  Component Value Date   HDL 34.60 (L) 06/28/2018   Lab Results  Component Value Date   LDLCALC 78 12/01/2017   Lab Results  Component Value Date   TRIG 278.0 (H) 06/28/2018   Lab Results  Component Value Date   CHOLHDL 4 06/28/2018   Lab Results  Component Value Date   HGBA1C 6.2 06/28/2018         Assessment & Plan:   Problem List Items Addressed This Visit    Hyperlipidemia    Encouraged heart healthy diet, increase exercise, avoid trans fats, consider a krill oil cap daily, tolerating Rosuvastatin      Relevant Orders   Lipid panel   Depression, recurrent ([place])    Is noting worsening depression with the pandemic. Her husband is very sick but also he is being mean. She increased her Zoloft to 75 mg a couple weeks ago. Will increase to 100 mg and reevaluate      Relevant Medications   sertraline (ZOLOFT) 100 MG tablet   HTN (hypertension)    Well controlled, no changes to meds. Encouraged heart healthy diet such as the DASH diet and exercise as tolerated.       Relevant Orders   CBC with Differential/Platelet   Comprehensive metabolic panel   TSH   Coronary artery disease involving coronary bypass graft of native heart with angina pectoris ([place])    Follows with cardiology in [place], Dr Curt Bears      COPD GOLD II - Primary    Refill given on Albuterol and referred back to [facility] for further consideration      Relevant Medications   albuterol (VENTOLIN HFA) 108 (90 Base) MCG/ACT inhaler   methylPREDNISolone (MEDROL) 4 MG tablet   Other Relevant Orders   Ambulatory referral to Pulmonology   Cough   Relevant Orders   Ambulatory referral to Pulmonology   Upper airway cough syndrome   Relevant Orders   Ambulatory referral to Pulmonology   Hyperglycemia    hgba1c acceptable, minimize simple carbs. Increase exercise as tolerated.       Hypothyroidism    On Levothyroxine, continue to monitor      Preventative health care    Patient encouraged to maintain heart healthy diet, regular exercise, adequate sleep. Consider daily probiotics. Take medications as prescribed. Labs reviewed and ordered. She is encouraged to take the covid vaccination.       Vitamin D deficiency    Supplement and monitor      Relevant Orders   VITAMIN D 25 Hydroxy (Vit-D Deficiency,  Fractures)      I have discontinued Leda Roys HYDROcodone-homatropine, azithromycin, and sertraline. I am also having her start on sertraline and methylPREDNISolone. Additionally, I am having her maintain her Calcium Carbonate-Vitamin D (CALCIUM + D PO), acetaminophen, Polyethyl Glycol-Propyl Glycol (SYSTANE OP), Krill Oil, fluticasone, furosemide, nitroGLYCERIN, HYDROcodone-acetaminophen, Vitamin D3, zinc gluconate, Biotin, clopidogrel, azelastine, levothyroxine, rosuvastatin, diltiazem, Vitamin D (Ergocalciferol), omeprazole, ezetimibe, famotidine, diltiazem, albuterol, Stiolto Respimat, amLODipine, Xarelto, isosorbide mononitrate, and albuterol.  Meds ordered this encounter  Medications  . DISCONTD: albuterol (VENTOLIN HFA) 108 (90 Base) MCG/ACT inhaler    Sig: INHALE 2 PUFFS INTO THE LUNGS EVERY 6 (SIX) HOURS AS NEEDED FOR WHEEZING.    Dispense:  18 g    Refill:  2  . sertraline (ZOLOFT) 100 MG tablet    Sig: Take 1 tablet (100 mg total) by mouth daily.    Dispense:  90 tablet    Refill:  3  . albuterol (VENTOLIN HFA) 108 (90 Base) MCG/ACT inhaler    Sig: INHALE 2 PUFFS INTO THE LUNGS EVERY 6 (SIX) HOURS AS NEEDED FOR WHEEZING.    Dispense:  18 g    Refill:  5  . methylPREDNISolone (MEDROL) 4 MG tablet    Sig: 5 tab po qd X 1d then 4 tab po qd X 1d then 3 tab po qd X 1d then 2 tab po qd then 1 tab po qd    Dispense:  15 tablet    Refill:  0     [name], MD

## 2019-06-13 NOTE — Assessment & Plan Note (Signed)
Is noting worsening depression with the pandemic. Her husband is very sick but also he is being mean. She increased her Zoloft to 75 mg a couple weeks ago. Will increase to 100 mg and reevaluate

## 2019-06-13 NOTE — Assessment & Plan Note (Addendum)
Encouraged heart healthy diet, increase exercise, avoid trans fats, consider a krill oil cap daily, tolerating Rosuvastatin

## 2019-06-13 NOTE — Assessment & Plan Note (Signed)
On Levothyroxine, continue to monitor 

## 2019-06-13 NOTE — Assessment & Plan Note (Signed)
hgba1c acceptable, minimize simple carbs. Increase exercise as tolerated.  

## 2019-06-13 NOTE — Assessment & Plan Note (Signed)
Patient encouraged to maintain heart healthy diet, regular exercise, adequate sleep. Consider daily probiotics. Take medications as prescribed. Labs reviewed and ordered. She is encouraged to take the covid vaccination.

## 2019-06-13 NOTE — Assessment & Plan Note (Addendum)
Follows with cardiology in [place], Dr Curt Bears

## 2019-06-13 NOTE — Assessment & Plan Note (Signed)
Supplement and monitor 

## 2019-06-13 NOTE — Assessment & Plan Note (Signed)
Refill given on Albuterol and referred back to [facility] for further consideration

## 2019-06-13 NOTE — Assessment & Plan Note (Signed)
Well controlled, no changes to meds. Encouraged heart healthy diet such as the DASH diet and exercise as tolerated.  °

## 2019-06-14 LAB — COMPREHENSIVE METABOLIC PANEL
ALT: 9 U/L (ref 0–35)
AST: 14 U/L (ref 0–37)
Albumin: 4.1 g/dL (ref 3.5–5.2)
Alkaline Phosphatase: 81 U/L (ref 39–117)
BUN: 11 mg/dL (ref 6–23)
CO2: 31 mEq/L (ref 19–32)
Calcium: 9.2 mg/dL (ref 8.4–10.5)
Chloride: 103 mEq/L (ref 96–112)
Creatinine, Ser: 0.96 mg/dL (ref 0.40–1.20)
GFR: 55.89 mL/min — ABNORMAL LOW (ref >60.00)
Glucose, Bld: 99 mg/dL (ref 70–99)
Potassium: 3.5 mEq/L (ref 3.5–5.1)
Sodium: 143 mEq/L (ref 135–145)
Total Bilirubin: 0.6 mg/dL (ref 0.2–1.2)
Total Protein: 6.5 g/dL (ref 6.0–8.3)

## 2019-06-14 LAB — CBC WITH DIFFERENTIAL/PLATELET
Basophils Absolute: 0.1 10*3/uL (ref 0.0–0.1)
Basophils Relative: 1.2 % (ref 0.0–3.0)
Eosinophils Absolute: 0.2 10*3/uL (ref 0.0–0.7)
Eosinophils Relative: 2.3 % (ref 0.0–5.0)
HCT: 40.3 % (ref 36.0–46.0)
Hemoglobin: 13.3 g/dL (ref 12.0–15.0)
Lymphocytes Relative: 20.2 % (ref 12.0–46.0)
Lymphs Abs: 1.6 10*3/uL (ref 0.7–4.0)
MCHC: 33.1 g/dL (ref 30.0–36.0)
MCV: 82.1 fl (ref 78.0–100.0)
Monocytes Absolute: 0.6 10*3/uL (ref 0.1–1.0)
Monocytes Relative: 7.6 % (ref 3.0–12.0)
Neutro Abs: 5.3 10*3/uL (ref 1.4–7.7)
Neutrophils Relative %: 68.7 % (ref 43.0–77.0)
Platelets: 247 10*3/uL (ref 150.0–400.0)
RBC: 4.9 Mil/uL (ref 3.87–5.11)
RDW: 16 % — ABNORMAL HIGH (ref 11.5–15.5)
WBC: 7.7 10*3/uL (ref 4.0–10.5)

## 2019-06-14 LAB — LIPID PANEL
Cholesterol: 119 mg/dL (ref 0–200)
HDL: 32.1 mg/dL — ABNORMAL LOW (ref >39.00)
NonHDL: 87.01
Total CHOL/HDL Ratio: 4
Triglycerides: 217 mg/dL — ABNORMAL HIGH (ref 0.0–149.0)
VLDL: 43.4 mg/dL — ABNORMAL HIGH (ref 0.0–40.0)

## 2019-06-14 LAB — LDL CHOLESTEROL, DIRECT: Direct LDL: 61 mg/dL

## 2019-06-14 LAB — TSH: TSH: 1.98 u[IU]/mL (ref 0.35–4.50)

## 2019-06-14 LAB — VITAMIN D 25 HYDROXY (VIT D DEFICIENCY, FRACTURES): VITD: 75.78 ng/mL (ref 30.00–100.00)

## 2019-06-29 DIAGNOSIS — H9311 Tinnitus, right ear: Secondary | ICD-10-CM | POA: Diagnosis not present

## 2019-06-29 DIAGNOSIS — H905 Unspecified sensorineural hearing loss: Secondary | ICD-10-CM | POA: Diagnosis not present

## 2019-06-29 DIAGNOSIS — D333 Benign neoplasm of cranial nerves: Secondary | ICD-10-CM | POA: Diagnosis not present

## 2019-06-29 DIAGNOSIS — H938X1 Other specified disorders of right ear: Secondary | ICD-10-CM | POA: Diagnosis not present

## 2019-07-19 ENCOUNTER — Other Ambulatory Visit: Payer: Self-pay | Admitting: Cardiovascular Disease

## 2019-07-19 DIAGNOSIS — G4733 Obstructive sleep apnea (adult) (pediatric): Secondary | ICD-10-CM | POA: Diagnosis not present

## 2019-07-25 ENCOUNTER — Other Ambulatory Visit: Payer: Self-pay | Admitting: Cardiovascular Disease

## 2019-07-25 ENCOUNTER — Other Ambulatory Visit: Payer: Self-pay | Admitting: Family Medicine

## 2019-07-28 ENCOUNTER — Other Ambulatory Visit: Payer: Self-pay | Admitting: Cardiology

## 2019-08-12 ENCOUNTER — Emergency Department: Payer: Medicare HMO

## 2019-08-12 ENCOUNTER — Telehealth: Payer: Self-pay | Admitting: Cardiology

## 2019-08-12 ENCOUNTER — Inpatient Hospital Stay
Admission: EM | Admit: 2019-08-12 | Discharge: 2019-08-17 | DRG: 291 | Disposition: A | Discharge status: Home / Self Care | Payer: Medicare HMO | Attending: Internal Medicine | Admitting: Internal Medicine

## 2019-08-12 ENCOUNTER — Other Ambulatory Visit: Payer: Self-pay

## 2019-08-12 DIAGNOSIS — R0600 Dyspnea, unspecified: Secondary | ICD-10-CM

## 2019-08-12 DIAGNOSIS — M479 Spondylosis, unspecified: Secondary | ICD-10-CM | POA: Diagnosis present

## 2019-08-12 DIAGNOSIS — I361 Nonrheumatic tricuspid (valve) insufficiency: Secondary | ICD-10-CM | POA: Diagnosis not present

## 2019-08-12 DIAGNOSIS — I110 Hypertensive heart disease with heart failure: Principal | ICD-10-CM | POA: Diagnosis present

## 2019-08-12 DIAGNOSIS — R0602 Shortness of breath: Secondary | ICD-10-CM

## 2019-08-12 DIAGNOSIS — G4733 Obstructive sleep apnea (adult) (pediatric): Secondary | ICD-10-CM | POA: Diagnosis present

## 2019-08-12 DIAGNOSIS — I252 Old myocardial infarction: Secondary | ICD-10-CM

## 2019-08-12 DIAGNOSIS — F329 Major depressive disorder, single episode, unspecified: Secondary | ICD-10-CM | POA: Diagnosis present

## 2019-08-12 DIAGNOSIS — E039 Hypothyroidism, unspecified: Secondary | ICD-10-CM | POA: Diagnosis present

## 2019-08-12 DIAGNOSIS — K219 Gastro-esophageal reflux disease without esophagitis: Secondary | ICD-10-CM | POA: Diagnosis present

## 2019-08-12 DIAGNOSIS — I714 Abdominal aortic aneurysm, without rupture: Secondary | ICD-10-CM | POA: Diagnosis present

## 2019-08-12 DIAGNOSIS — Z86711 Personal history of pulmonary embolism: Secondary | ICD-10-CM

## 2019-08-12 DIAGNOSIS — Z7901 Long term (current) use of anticoagulants: Secondary | ICD-10-CM

## 2019-08-12 DIAGNOSIS — I480 Paroxysmal atrial fibrillation: Secondary | ICD-10-CM | POA: Diagnosis not present

## 2019-08-12 DIAGNOSIS — Z880 Allergy status to penicillin: Secondary | ICD-10-CM

## 2019-08-12 DIAGNOSIS — Z8249 Family history of ischemic heart disease and other diseases of the circulatory system: Secondary | ICD-10-CM

## 2019-08-12 DIAGNOSIS — I5032 Chronic diastolic (congestive) heart failure: Secondary | ICD-10-CM | POA: Diagnosis present

## 2019-08-12 DIAGNOSIS — I5033 Acute on chronic diastolic (congestive) heart failure: Secondary | ICD-10-CM | POA: Diagnosis present

## 2019-08-12 DIAGNOSIS — J9601 Acute respiratory failure with hypoxia: Secondary | ICD-10-CM | POA: Diagnosis not present

## 2019-08-12 DIAGNOSIS — J9611 Chronic respiratory failure with hypoxia: Secondary | ICD-10-CM

## 2019-08-12 DIAGNOSIS — E785 Hyperlipidemia, unspecified: Secondary | ICD-10-CM | POA: Diagnosis present

## 2019-08-12 DIAGNOSIS — Z85828 Personal history of other malignant neoplasm of skin: Secondary | ICD-10-CM

## 2019-08-12 DIAGNOSIS — M5126 Other intervertebral disc displacement, lumbar region: Secondary | ICD-10-CM | POA: Diagnosis present

## 2019-08-12 DIAGNOSIS — Z811 Family history of alcohol abuse and dependence: Secondary | ICD-10-CM

## 2019-08-12 DIAGNOSIS — I679 Cerebrovascular disease, unspecified: Secondary | ICD-10-CM | POA: Diagnosis present

## 2019-08-12 DIAGNOSIS — Z955 Presence of coronary angioplasty implant and graft: Secondary | ICD-10-CM

## 2019-08-12 DIAGNOSIS — M810 Age-related osteoporosis without current pathological fracture: Secondary | ICD-10-CM | POA: Diagnosis present

## 2019-08-12 DIAGNOSIS — J449 Chronic obstructive pulmonary disease, unspecified: Secondary | ICD-10-CM | POA: Diagnosis not present

## 2019-08-12 DIAGNOSIS — I2510 Atherosclerotic heart disease of native coronary artery without angina pectoris: Secondary | ICD-10-CM | POA: Diagnosis present

## 2019-08-12 DIAGNOSIS — Z7902 Long term (current) use of antithrombotics/antiplatelets: Secondary | ICD-10-CM

## 2019-08-12 DIAGNOSIS — I2781 Cor pulmonale (chronic): Secondary | ICD-10-CM | POA: Diagnosis not present

## 2019-08-12 DIAGNOSIS — I4819 Other persistent atrial fibrillation: Secondary | ICD-10-CM | POA: Diagnosis not present

## 2019-08-12 DIAGNOSIS — Z801 Family history of malignant neoplasm of trachea, bronchus and lung: Secondary | ICD-10-CM

## 2019-08-12 DIAGNOSIS — Z800 Family history of malignant neoplasm of digestive organs: Secondary | ICD-10-CM

## 2019-08-12 DIAGNOSIS — F339 Major depressive disorder, recurrent, unspecified: Secondary | ICD-10-CM | POA: Diagnosis present

## 2019-08-12 DIAGNOSIS — I25709 Atherosclerosis of coronary artery bypass graft(s), unspecified, with unspecified angina pectoris: Secondary | ICD-10-CM | POA: Diagnosis present

## 2019-08-12 DIAGNOSIS — Z91013 Allergy to seafood: Secondary | ICD-10-CM

## 2019-08-12 DIAGNOSIS — Z79890 Hormone replacement therapy (postmenopausal): Secondary | ICD-10-CM

## 2019-08-12 DIAGNOSIS — Z823 Family history of stroke: Secondary | ICD-10-CM

## 2019-08-12 DIAGNOSIS — Z833 Family history of diabetes mellitus: Secondary | ICD-10-CM

## 2019-08-12 DIAGNOSIS — G8929 Other chronic pain: Secondary | ICD-10-CM | POA: Diagnosis present

## 2019-08-12 DIAGNOSIS — J441 Chronic obstructive pulmonary disease with (acute) exacerbation: Secondary | ICD-10-CM | POA: Diagnosis not present

## 2019-08-12 DIAGNOSIS — Z20822 Contact with and (suspected) exposure to covid-19: Secondary | ICD-10-CM | POA: Diagnosis present

## 2019-08-12 DIAGNOSIS — Z66 Do not resuscitate: Secondary | ICD-10-CM | POA: Diagnosis not present

## 2019-08-12 DIAGNOSIS — I10 Essential (primary) hypertension: Secondary | ICD-10-CM | POA: Diagnosis present

## 2019-08-12 DIAGNOSIS — I482 Chronic atrial fibrillation, unspecified: Secondary | ICD-10-CM

## 2019-08-12 DIAGNOSIS — Z91048 Other nonmedicinal substance allergy status: Secondary | ICD-10-CM

## 2019-08-12 DIAGNOSIS — Z87891 Personal history of nicotine dependence: Secondary | ICD-10-CM

## 2019-08-12 DIAGNOSIS — Z888 Allergy status to other drugs, medicaments and biological substances status: Secondary | ICD-10-CM

## 2019-08-12 DIAGNOSIS — R0902 Hypoxemia: Secondary | ICD-10-CM

## 2019-08-12 DIAGNOSIS — I509 Heart failure, unspecified: Secondary | ICD-10-CM | POA: Diagnosis not present

## 2019-08-12 DIAGNOSIS — I739 Peripheral vascular disease, unspecified: Secondary | ICD-10-CM | POA: Diagnosis present

## 2019-08-12 DIAGNOSIS — Z881 Allergy status to other antibiotic agents status: Secondary | ICD-10-CM

## 2019-08-12 DIAGNOSIS — I340 Nonrheumatic mitral (valve) insufficiency: Secondary | ICD-10-CM | POA: Diagnosis not present

## 2019-08-12 LAB — BLOOD GAS, VENOUS
Acid-Base Excess: 4.9 mmol/L — ABNORMAL HIGH (ref 0.0–2.0)
Bicarbonate: 31.4 mmol/L — ABNORMAL HIGH (ref 20.0–28.0)
O2 Saturation: 41.2 %
Patient temperature: 37
pCO2, Ven: 53 mmHg (ref 44.0–60.0)
pH, Ven: 7.38 (ref 7.250–7.430)
pO2, Ven: <31 mmHg — CL (ref 32.0–45.0)

## 2019-08-12 LAB — BRAIN NATRIURETIC PEPTIDE: B Natriuretic Peptide: 337 pg/mL — ABNORMAL HIGH (ref 0.0–100.0)

## 2019-08-12 LAB — CBC
HCT: 38.8 % (ref 36.0–46.0)
Hemoglobin: 12 g/dL (ref 12.0–15.0)
MCH: 25.3 pg — ABNORMAL LOW (ref 26.0–34.0)
MCHC: 30.9 g/dL (ref 30.0–36.0)
MCV: 81.9 fL (ref 80.0–100.0)
Platelets: 160 10*3/uL (ref 150–400)
RBC: 4.74 MIL/uL (ref 3.87–5.11)
RDW: 14.8 % (ref 11.5–15.5)
WBC: 6 10*3/uL (ref 4.0–10.5)
nRBC: 0 % (ref 0.0–0.2)

## 2019-08-12 LAB — BASIC METABOLIC PANEL
Anion gap: 11 (ref 5–15)
BUN: 15 mg/dL (ref 8–23)
CO2: 25 mmol/L (ref 22–32)
Calcium: 9.2 mg/dL (ref 8.9–10.3)
Chloride: 106 mmol/L (ref 98–111)
Creatinine, Ser: 0.93 mg/dL (ref 0.44–1.00)
GFR calc Af Amer: >60 mL/min (ref >60)
GFR calc non Af Amer: 58 mL/min — ABNORMAL LOW (ref >60)
Glucose, Bld: 97 mg/dL (ref 70–99)
Potassium: 3.9 mmol/L (ref 3.5–5.1)
Sodium: 142 mmol/L (ref 135–145)

## 2019-08-12 LAB — TROPONIN I (HIGH SENSITIVITY)
Troponin I (High Sensitivity): 7 ng/L (ref <18)
Troponin I (High Sensitivity): 7 ng/L (ref <18)

## 2019-08-12 LAB — POC SARS CORONAVIRUS 2 AG: SARS Coronavirus 2 Ag: NEGATIVE

## 2019-08-12 MED ORDER — ISOSORBIDE MONONITRATE ER 30 MG PO TB24
60.0000 mg | ORAL_TABLET | Freq: Two times a day (BID) | ORAL | Status: DC
  Administered 2019-08-12 – 2019-08-17 (×10): 60 mg via ORAL
  Filled 2019-08-12: qty 1
  Filled 2019-08-12 (×9): qty 2

## 2019-08-12 MED ORDER — PREDNISONE 20 MG PO TABS
40.0000 mg | ORAL_TABLET | Freq: Every day | ORAL | Status: DC
  Administered 2019-08-13 – 2019-08-15 (×3): 40 mg via ORAL
  Filled 2019-08-12 (×3): qty 2

## 2019-08-12 MED ORDER — AZITHROMYCIN 500 MG PO TABS
250.0000 mg | ORAL_TABLET | Freq: Every day | ORAL | Status: AC
  Administered 2019-08-13 – 2019-08-16 (×4): 250 mg via ORAL
  Filled 2019-08-12 (×4): qty 1

## 2019-08-12 MED ORDER — SODIUM CHLORIDE 0.9% FLUSH: 3.0000 mL | INTRAVENOUS | Status: DC | PRN

## 2019-08-12 MED ORDER — ALBUTEROL SULFATE (2.5 MG/3ML) 0.083% IN NEBU: 2.5000 mg | INHALATION_SOLUTION | RESPIRATORY_TRACT | Status: DC | PRN

## 2019-08-12 MED ORDER — DILTIAZEM HCL ER COATED BEADS 240 MG PO CP24
240.0000 mg | ORAL_CAPSULE | Freq: Every day | ORAL | Status: DC
  Administered 2019-08-13 – 2019-08-17 (×5): 240 mg via ORAL
  Filled 2019-08-12 (×5): qty 1

## 2019-08-12 MED ORDER — IPRATROPIUM-ALBUTEROL 0.5-2.5 (3) MG/3ML IN SOLN
3.0000 mL | Freq: Once | RESPIRATORY_TRACT | Status: AC
  Administered 2019-08-12: 3 mL via RESPIRATORY_TRACT
  Filled 2019-08-12: qty 3

## 2019-08-12 MED ORDER — SODIUM CHLORIDE 0.9 % IV SOLN: 250.0000 mL | INTRAVENOUS | Status: DC | PRN

## 2019-08-12 MED ORDER — IPRATROPIUM-ALBUTEROL 0.5-2.5 (3) MG/3ML IN SOLN
3.0000 mL | Freq: Four times a day (QID) | RESPIRATORY_TRACT | Status: DC
  Administered 2019-08-12 – 2019-08-16 (×16): 3 mL via RESPIRATORY_TRACT
  Filled 2019-08-12 (×17): qty 3

## 2019-08-12 MED ORDER — ALBUTEROL SULFATE (2.5 MG/3ML) 0.083% IN NEBU
2.5000 mg | INHALATION_SOLUTION | Freq: Once | RESPIRATORY_TRACT | Status: AC
  Administered 2019-08-12: 2.5 mg via RESPIRATORY_TRACT
  Filled 2019-08-12: qty 3

## 2019-08-12 MED ORDER — SODIUM CHLORIDE 0.9% FLUSH
3.0000 mL | Freq: Two times a day (BID) | INTRAVENOUS | Status: DC
  Administered 2019-08-13 – 2019-08-17 (×8): 3 mL via INTRAVENOUS

## 2019-08-12 MED ORDER — AZITHROMYCIN 500 MG PO TABS
500.0000 mg | ORAL_TABLET | Freq: Every day | ORAL | Status: AC
  Administered 2019-08-12: 500 mg via ORAL
  Filled 2019-08-12: qty 1

## 2019-08-12 MED ORDER — PREDNISONE 20 MG PO TABS
60.0000 mg | ORAL_TABLET | Freq: Once | ORAL | Status: AC
  Administered 2019-08-12: 60 mg via ORAL
  Filled 2019-08-12: qty 3

## 2019-08-12 MED ORDER — RIVAROXABAN 15 MG PO TABS
15.0000 mg | ORAL_TABLET | Freq: Every day | ORAL | Status: DC
  Administered 2019-08-12 – 2019-08-17 (×6): 15 mg via ORAL
  Filled 2019-08-12 (×6): qty 1

## 2019-08-12 MED ORDER — HYDROCODONE-ACETAMINOPHEN 10-325 MG PO TABS
1.0000 | ORAL_TABLET | Freq: Four times a day (QID) | ORAL | Status: DC | PRN
  Administered 2019-08-12 – 2019-08-17 (×7): 1 via ORAL
  Filled 2019-08-12 (×7): qty 1

## 2019-08-12 MED ORDER — LEVOTHYROXINE SODIUM 50 MCG PO TABS
50.0000 ug | ORAL_TABLET | Freq: Every day | ORAL | Status: DC
  Administered 2019-08-13 – 2019-08-17 (×5): 50 ug via ORAL
  Filled 2019-08-12 (×5): qty 1

## 2019-08-12 MED ORDER — SERTRALINE HCL 100 MG PO TABS
100.0000 mg | ORAL_TABLET | Freq: Every day | ORAL | Status: DC
  Administered 2019-08-12 – 2019-08-16 (×5): 100 mg via ORAL
  Filled 2019-08-12 (×3): qty 2
  Filled 2019-08-12 (×2): qty 1
  Filled 2019-08-12: qty 2
  Filled 2019-08-12 (×2): qty 1

## 2019-08-12 MED ORDER — EZETIMIBE 10 MG PO TABS
10.0000 mg | ORAL_TABLET | Freq: Every day | ORAL | Status: DC
  Administered 2019-08-13 – 2019-08-17 (×5): 10 mg via ORAL
  Filled 2019-08-12 (×5): qty 1

## 2019-08-12 MED ORDER — PANTOPRAZOLE SODIUM 40 MG PO TBEC
40.0000 mg | DELAYED_RELEASE_TABLET | Freq: Every day | ORAL | Status: DC
  Administered 2019-08-12 – 2019-08-17 (×6): 40 mg via ORAL
  Filled 2019-08-12 (×6): qty 1

## 2019-08-12 MED ORDER — SODIUM CHLORIDE 0.9% FLUSH: 3.0000 mL | Freq: Once | INTRAVENOUS | Status: DC

## 2019-08-12 MED ORDER — ROSUVASTATIN CALCIUM 20 MG PO TABS
40.0000 mg | ORAL_TABLET | Freq: Every day | ORAL | Status: DC
  Administered 2019-08-12 – 2019-08-17 (×6): 40 mg via ORAL
  Filled 2019-08-12 (×7): qty 2

## 2019-08-12 MED ORDER — CLOPIDOGREL BISULFATE 75 MG PO TABS
75.0000 mg | ORAL_TABLET | Freq: Every day | ORAL | Status: DC
  Administered 2019-08-13 – 2019-08-17 (×5): 75 mg via ORAL
  Filled 2019-08-12 (×6): qty 1

## 2019-08-12 MED ORDER — FUROSEMIDE 20 MG PO TABS: 20.0000 mg | ORAL_TABLET | ORAL | Status: DC

## 2019-08-12 NOTE — H&P (Signed)
History and Physical    Andrea [name] BHA:[id number] DOB: 1941/03/01 DOA: 08/12/2019  PCP: Mosie Lukes, MD  Patient coming from: home   Chief Complaint: shortness of breath  HPI: Andrea [name] is a 79 y.o. female with medical history significant for copd, CAD s/p cag 1999 and cardiac stents 2013 and 2017, AAA, carotid artery stenosis, major depressive disorder, htn, hypothyroid, a-fib, pulmonary hypertension, who presents with above.  Symptoms for the past 2 weeks, progressively worsening. Dyspnea with exertion, worsening such that small tasks such as getting dressed leave her winded. Also with cough, not productive. Does endorse wheezing. Former smoker. No recent antibiotics. No covid contacts, had covid vaccine #2 last week. No fevers. No chest pain. Sleeps at slight incline, this has not changed recently. No leg swelling or edema. No chest pain with exertion. Taking xarelto. This is the first time she has sought treatment for this problem.   ED Course: prednisone, duonebs. Hypoxic to mid-80s with minimal movent out of bed.  Review of Systems: As per HPI otherwise 10 point review of systems negative.    Past Medical History:  Diagnosis Date  . AAA (abdominal aortic aneurysm) ([place]) 01/2009   AAA (2.8 x 3.0)  moderate RAS (left); 2.7 x 2.7 cm (07/11/05)  . Acute bronchitis 03/20/2013; 2017  . Angina   . Anosmia   . Asthma   . Baker's cyst of knee 01/30/2014  . Basal cell carcinoma    "back and left arm"  . Bradycardia    Metoprolol stopped 08/2011  . CAD (coronary artery disease)    s/CABG (reports IMA and 2 SVGs) back in 1999  Myoview normal 3/10; s/p PCI with DES to PL branch of distal RCA 09/2011; PCI +DES to SVG-RCA, PCI + DES to mid LCx 12/2015  . Cerebrovascular disease 01/2009   carotid u/s  R 0-39%   L 60-79%  . Chronic thoracic back pain   . COPD (chronic obstructive pulmonary disease) ([place])   . Depression   . Dizziness   . Dysrhythmia    hx of sinus brady  . GERD  (gastroesophageal reflux disease)   . Heart murmur   . Herniated lumbar disc without myelopathy   . History of blood transfusion 1999   "when I had the bypass; had a PE"  . Hyperglycemia 10/23/2015  . Hyperlipidemia   . Hypertension   . Hypothyroidism 09/30/2016  . Medicare annual wellness visit, subsequent 07/31/2013  . NSTEMI (non-ST elevated myocardial infarction) ([place]) 11/12   Cath showed atretic IMA graft to the LAD, SVG to PD was patent but the continuation of this graft to the PL branch was occluded; there were L to R collaterals; Mid LAD had a 60 to 70% stenosis. She has been treated medically.  Neg Myoview 05/2011  . Osteoarthritis of back   . Osteoporosis   . Pneumonia "several times"  . Pulmonary embolism ([place]) 1999   "after my bypass"  . PVD (peripheral vascular disease) ([place])   . Shortness of breath   . Squamous carcinoma    "nose"  . Thyroid disease    Hypothyroid  . Unstable angina ([place]) 09/25/2015    Past Surgical History:  Procedure Laterality Date  . ABDOMINAL AORTIC ANEURYSM REPAIR     pt denies this hx on 01/02/2016  . BASAL CELL CARCINOMA EXCISION    . CARDIAC CATHETERIZATION N/A 01/02/2016   Procedure: Left Heart Cath and Coronary Angiography;  Surgeon: Wellington Hampshire, MD;  Location: [facility];  Service: Cardiovascular;  Laterality: N/A;  . CARDIAC CATHETERIZATION  1996; 1999  . CARDIAC CATHETERIZATION N/A 06/25/2016   Procedure: Left Heart Cath and Cors/Grafts Angiography;  Surgeon: Wellington Hampshire, MD;  Location: [facility];  Service: Cardiovascular;  Laterality: N/A;  . CAROTID ENDARTERECTOMY Left 01/02/2011  . CATARACT EXTRACTION W/ INTRAOCULAR LENS  IMPLANT, BILATERAL    . CLOSED REDUCTION NASAL FRACTURE  11/2007  . CORONARY ANGIOPLASTY WITH STENT PLACEMENT  10/10/2011   drug eluting  to rc & saphenous  . CORONARY ARTERY BYPASS GRAFT  1999   "CABG X3"  . DILATION AND CURETTAGE OF UTERUS    . FEMORAL ARTERY STENT Bilateral   . FRACTURE  SURGERY    . LEFT HEART CATHETERIZATION WITH CORONARY/GRAFT ANGIOGRAM N/A 04/22/2011   Procedure: LEFT HEART CATHETERIZATION WITH Beatrix Fetters;  Surgeon: Jolaine Artist, MD;  Location: [facility] CATH LAB;  Service: Cardiovascular;  Laterality: N/A;  . LEFT HEART CATHETERIZATION WITH CORONARY/GRAFT ANGIOGRAM N/A 10/10/2011   Procedure: LEFT HEART CATHETERIZATION WITH Beatrix Fetters;  Surgeon: Peter M Martinique, MD;  Location: [facility] C[facility] LAB;  Service: Cardiovascular;  Laterality: N/A;  . NASAL SINUS SURGERY     twice  . SQUAMOUS CELL CARCINOMA EXCISION    . SVT ABLATION N/A 03/19/2018   Procedure: SVT ABLATION;  Surgeon: Constance Haw, MD;  Location: [facility];  Service: Cardiovascular;  Laterality: N/A;  . TUBAL LIGATION       reports that she quit smoking about 26 years ago. Her smoking use included cigarettes. She has a 20.00 pack-year smoking history. She has never used smokeless tobacco. She reports current alcohol use. She reports that she does not use drugs.  Allergies  Allergen Reactions  . Erythromycin Other (See Comments)    Tongue burns  . Meperidine Nausea And Vomiting  . Shellfish Allergy Nausea And Vomiting  . Ciprofloxacin Rash and Other (See Comments)    Rash from IV  . Codeine Nausea And Vomiting  . Penicillins Rash and Other (See Comments)    Has patient had a PCN reaction causing immediate rash, facial/tongue/throat swelling, SOB or lightheadedness with hypotension:unsure Has patient had a PCN reaction causing severe rash involving mucus membranes or skin necrosis:No Has patient had a PCN reaction that required hospitalization:No Has patient had a PCN reaction occurring within the last 10 years:No If all of the above answers are "NO", then may proceed with Cephalosporin use.   . Tape Rash    pls use paper tape    Family History  Problem Relation Age of Onset  . Heart attack Mother 17  . Diabetes Mother   . Colon cancer Father 52  .  Lung cancer Father        smoked  . Heart disease Father   . Hypertension Father   . Angina Father   . Lung cancer Sister        smoked  . Hypothyroidism Sister   . Hypertension Brother   . Heart attack Brother   . Heart disease Brother        PTCA with Stent  . Heart attack Brother        CABG  . Heart disease Brother        CABG with 1 bypass  . Aneurysm Brother        brain  . Alcohol abuse Brother   . Hyperlipidemia Daughter   . Diabetes Maternal Grandmother   . Stroke Maternal Grandmother   .  Cirrhosis Maternal Grandfather   . Stroke Paternal Grandmother   . Obesity Daughter   . Obesity Son     Prior to Admission medications   Medication Sig Start Date End Date Taking? Authorizing Provider  acetaminophen (TYLENOL) 650 MG CR tablet Take 1,300 mg by mouth 2 (two) times daily.    [provider]  albuterol (PROVENTIL) (2.5 MG/3ML) 0.083% nebulizer solution Take 3 mLs (2.5 mg total) by nebulization every 4 (four) hours as needed for wheezing or shortness of breath. Dx:J44.9 03/25/19   Flora Lipps, MD  albuterol (VENTOLIN HFA) 108 (90 Base) MCG/ACT inhaler INHALE 2 PUFFS INTO THE LUNGS EVERY 6 (SIX) HOURS AS NEEDED FOR WHEEZING. 06/13/19   Mosie Lukes, MD  amLODipine (NORVASC) 5 MG tablet TAKE 1 TABLET BY MOUTH ONCE DAILY 05/31/19   Minna Merritts, MD  azelastine (ASTELIN) 0.1 % nasal spray Place 2 sprays into both nostrils at bedtime as needed for rhinitis. 06/28/18   Mosie Lukes, MD  Biotin 5 MG TABS Take 5 mg by mouth daily.     [provider]  Calcium Carbonate-Vitamin D (CALCIUM + D PO) Take 1 tablet by mouth daily.     [provider]  Cholecalciferol (VITAMIN D3) 2000 units TABS Take 2,000 Units by mouth daily.     [provider]  clopidogrel (PLAVIX) 75 MG tablet TAKE 1 TABLET BY MOUTH ONCE A DAY 07/19/19   Minna Merritts, MD  diltiazem (CARDIZEM CD) 240 MG 24 hr capsule Take 1 capsule (240 mg total) by mouth daily. Pt must  keep appt in May to continue receiving refills 07/28/19   Camnitz, Ocie Doyne, MD  diltiazem (CARDIZEM) 30 MG tablet Take one tablet by mouth every 4 hours AS NEEDED for A-fib HR > 100 as long as BP > 100. 11/11/18   Fenton, Clint R, PA  ezetimibe (ZETIA) 10 MG tablet TAKE 1 TABLET BY MOUTH DAILY 07/25/19   Minna Merritts, MD  famotidine (PEPCID) 20 MG tablet Take 20 mg by mouth 2 (two) times daily.    [provider]  fluticasone (FLONASE) 50 MCG/ACT nasal spray Place 2 sprays into both nostrils daily. For congestion 08/08/16   Mosie Lukes, MD  furosemide (LASIX) 20 MG tablet Take 20 mg by mouth every Monday, Wednesday, and Friday.     [provider]  HYDROcodone-acetaminophen (NORCO) 10-325 MG tablet Take 1 tablet by mouth every 6 (six) hours as needed for severe pain.    [provider]  isosorbide mononitrate (IMDUR) 60 MG 24 hr tablet TAKE 1 TABLET BY MOUTH TWICE A DAY 05/31/19   Gollan, Kathlene November, MD  Krill Oil 500 MG CAPS Take 500 mg by mouth daily.     [provider]  levothyroxine (SYNTHROID) 50 MCG tablet TAKE 1 TABLET BY MOUTH ONCE DAILY 07/25/19   Mosie Lukes, MD  methylPREDNISolone (MEDROL) 4 MG tablet 5 tab po qd X 1d then 4 tab po qd X 1d then 3 tab po qd X 1d then 2 tab po qd then 1 tab po qd 06/13/19   Mosie Lukes, MD  nitroGLYCERIN (NITROSTAT) 0.4 MG SL tablet PLACE 1 TABLET (0.4 MG TOTAL) UNDER THE TONGUE EVERY 5 (FIVE) MINUTES AS NEEDED FOR CHEST PAIN. 02/01/18   Minna Merritts, MD  omeprazole (PRILOSEC) 20 MG capsule TAKE 1 CAPSULE BY MOUTH ONCE DAILY 01/26/19   Mosie Lukes, MD  Polyethyl Glycol-Propyl Glycol (SYSTANE OP) Place 1 drop  into both eyes 2 (two) times a day.     [provider]  rosuvastatin (CRESTOR) 40 MG tablet TAKE 1 TABLET BY MOUTH ONCE A DAY 09/01/18   Minna Merritts, MD  sertraline (ZOLOFT) 100 MG tablet Take 1 tablet (100 mg total) by mouth daily. 06/13/19   Mosie Lukes, MD  STIOLTO RESPIMAT 2.5-2.5  MCG/ACT AERS INHALE 2 PUFFS INTO THE LUNGS ONCE DAILY 05/02/19   Flora Lipps, MD  Vitamin D, Ergocalciferol, (DRISDOL) 1.25 MG (50000 UT) CAPS capsule TAKE 1 CAPSULE BY MOUTH ONCE A WEEK 11/24/18   Mosie Lukes, MD  XARELTO 15 MG TABS tablet TAKE 1 TABLET BY MOUTH ONCE A DAY WITH SUPPER 05/31/19   Fenton, Clint R, PA  zinc gluconate 50 MG tablet Take 50 mg by mouth daily.    [provider]    Physical Exam: Vitals:   08/12/19 1236 08/12/19 1448 08/12/19 1640 08/12/19 2000  BP: 131/79 136/73 135/68 (!) 156/84  Pulse: 97 72 72 85  Resp: 18 18 20 19   Temp: 98.7 F (37.1 C)     SpO2: 92% 93% 94% 92%  Weight: 68 kg     Height: 5\' 2"  (1.575 m)       Constitutional: No acute distress Head: Atraumatic Eyes: Conjunctiva clear ENM: Moist mucous membranes. Normal dentition.  Neck: Supple Respiratory: faint exp wheezes, rhonchi in the bases Cardiovascular: irreg irreg, faint systolic murmur Abdomen: Non-tender, non-distended. No masses. No rebound or guarding. Positive bowel sounds. Musculoskeletal: No joint deformity upper and lower extremities. Normal ROM, no contractures. Normal muscle tone.  Skin: No rashes, lesions, or ulcers.  Extremities: trace LE peripheral edema. Palpable peripheral pulses. Neurologic: Alert, moving all 4 extremities. Psychiatric: Normal insight and judgement.   Labs on Admission: I have personally reviewed following labs and imaging studies  CBC: Recent Labs  Lab 08/12/19 1238  WBC 6.0  HGB 12.0  HCT 38.8  MCV 81.9  PLT 250   Basic Metabolic Panel: Recent Labs  Lab 08/12/19 1238  NA 142  K 3.9  CL 106  CO2 25  GLUCOSE 97  BUN 15  CREATININE 0.93  CALCIUM 9.2   GFR: Estimated Creatinine Clearance: 43.6 mL/min (by C-G formula based on SCr of 0.93 mg/dL). Liver Function Tests: No results for input(s): AST, ALT, ALKPHOS, BILITOT, PROT, ALBUMIN in the last 168 hours. No results for input(s): LIPASE, AMYLASE in the last 168 hours. No  results for input(s): AMMONIA in the last 168 hours. Coagulation Profile: No results for input(s): INR, PROTIME in the last 168 hours. Cardiac Enzymes: No results for input(s): CKTOTAL, CKMB, CKMBINDEX, TROPONINI in the last 168 hours. BNP (last 3 results) Recent Labs    03/22/19 1131  PROBNP 567   HbA1C: No results for input(s): HGBA1C in the last 72 hours. CBG: No results for input(s): GLUCAP in the last 168 hours. Lipid Profile: No results for input(s): CHOL, HDL, LDLCALC, TRIG, CHOLHDL, LDLDIRECT in the last 72 hours. Thyroid Function Tests: No results for input(s): TSH, T4TOTAL, FREET4, T3FREE, THYROIDAB in the last 72 hours. Anemia Panel: No results for input(s): VITAMINB12, FOLATE, FERRITIN, TIBC, IRON, RETICCTPCT in the last 72 hours. Urine analysis:    Component Value Date/Time   COLORURINE YELLOW 06/28/2018 [place] 06/28/2018 1533   LABSPEC 1.020 06/28/2018 1533   PHURINE 5.5 06/28/2018 1533   GLUCOSEU NEGATIVE 06/28/2018 1533   HGBUR NEGATIVE 06/28/2018 [place] 06/28/2018 1533   BILIRUBINUR  negative 03/27/2015 1833   KETONESUR NEGATIVE 06/28/2018 1533   PROTEINUR negative 03/27/2015 1833   PROTEINUR NEGATIVE 07/15/2014 1548   UROBILINOGEN 0.2 06/28/2018 1533   NITRITE NEGATIVE 06/28/2018 1533   LEUKOCYTESUR NEGATIVE 06/28/2018 1533    Radiological Exams on Admission: DG Chest 2 View  Result Date: 08/12/2019 CLINICAL DATA:  Worsening shortness of breath and LEFT-sided chest pressure, fatigue, history coronary artery disease post stenting and bypass surgery, hypertension, COPD, former smoker EXAM: CHEST - 2 VIEW COMPARISON:  04/08/2019 FINDINGS: Enlargement of cardiac silhouette post median sternotomy. Mediastinal contours and pulmonary vascularity normal. Atherosclerotic calcification aorta. Emphysematous and bronchitic changes consistent with COPD. Biapical scarring and chronic interstitial prominence stable. No acute  infiltrate, pleural effusion, or pneumothorax. Bones demineralized. IMPRESSION: Enlargement of cardiac silhouette post median sternotomy. COPD changes with biapical scarring and chronic interstitial prominence. No acute abnormalities. Aortic Atherosclerosis (ICD10-I70.0) and Emphysema (ICD10-J43.9). Electronically Signed   By: Lavonia Dana M.D.   On: 08/12/2019 13:12    EKG: Independently reviewed. twi inferolateral leads  Assessment/Plan Principal Problem:   COPD with acute exacerbation (HCC) Active Problems:   Depression, recurrent (HCC)   HTN (hypertension)   Coronary artery disease involving coronary bypass graft of native heart with angina pectoris (HCC)   PERIPHERAL VASCULAR DISEASE   Hypoxia   Atrial fibrillation, chronic (HCC)   OSA (obstructive sleep apnea)   # COPD with acute exacerbation # Hypoxia - here well appearing in bed, no respiratory distress. Per ED desat with minimal exertion and pt reports history of significant dyspnea with exertion. No chest pain and cardiac biomarkers are normal. bnp mildly elevated; no signs CHF clinically or on CXR. Unlikely PE given concurrent xarelto use. Unlikely covid given vaccination status and lack of covid contacts. - continue prednisone - start azithromycin  - duonebs q6, albuterol q2 prn - hold home stiolto while on duonebs - [place] O2, wean as able - f/u covid test  # OSA - cont qhs cpap  # MDD - cont home sertraline  # a-fib - here in a fib, w/o rvr - cont home diltiazem, xarelto  # htn - here bp wnl - cont home dilt, hold home amlodipine given concurrent dilt  # CAD - cont home zetia and plavix and rosuvastatin - confirmed w/ pt cardiology has her taking both plavix and xarelto  # chronic pain - says uses home hydrocodone sparingly - cont prn hydrocodone  # phtn - cont home imdur, m/w/f lasix  # hypothyroid - cont home levothyroxine  DVT prophylaxis: home xarelto Code Status: dnr, confirmed w/ pt  Family  Communication: husband michael, not updated tonight  Disposition Plan: tbd  Consults called: none  Admission status: med/surg observation    Desma Maxim MD [facility] Pager [phone]  If 7PM-7AM, please contact night-coverage [email or web address] Password [facility]  08/12/2019, 8:43 PM

## 2019-08-12 NOTE — ED Provider Notes (Signed)
[facility] Emergency Department Provider Note    First MD Initiated Contact with Patient 08/12/19 1809     (approximate)  I have reviewed the triage vital signs and the nursing notes.   HISTORY  Chief Complaint Shortness of Breath    HPI Andrea [name] is a 79 y.o. female with extensive past medical history as listed below presents to the ER for exertional shortness of breath generalized weakness and some left upper chest discomfort with exertion for the past 2 weeks.  States she has been feeling herself wheeze more.  Does not have home oxygen and does have a home pulse oximeter that she measures going down to 83 quite frequently.  If she sits and rests it will come up into the 90s.  Denies any fevers.  Has not been on any steroids or antibiotics recently.    Past Medical History:  Diagnosis Date  . AAA (abdominal aortic aneurysm) ([place]) 01/2009   AAA (2.8 x 3.0)  moderate RAS (left); 2.7 x 2.7 cm (07/11/05)  . Acute bronchitis 03/20/2013; 2017  . Angina   . Anosmia   . Asthma   . Baker's cyst of knee 01/30/2014  . Basal cell carcinoma    "back and left arm"  . Bradycardia    Metoprolol stopped 08/2011  . CAD (coronary artery disease)    s/CABG (reports IMA and 2 SVGs) back in 1999  Myoview normal 3/10; s/p PCI with DES to PL branch of distal RCA 09/2011; PCI +DES to SVG-RCA, PCI + DES to mid LCx 12/2015  . Cerebrovascular disease 01/2009   carotid u/s  R 0-39%   L 60-79%  . Chronic thoracic back pain   . COPD (chronic obstructive pulmonary disease) ([place])   . Depression   . Dizziness   . Dysrhythmia    hx of sinus brady  . GERD (gastroesophageal reflux disease)   . Heart murmur   . Herniated lumbar disc without myelopathy   . History of blood transfusion 1999   "when I had the bypass; had a PE"  . Hyperglycemia 10/23/2015  . Hyperlipidemia   . Hypertension   . Hypothyroidism 09/30/2016  . Medicare annual wellness visit, subsequent 07/31/2013  . NSTEMI  (non-ST elevated myocardial infarction) ([place]) 11/12   Cath showed atretic IMA graft to the LAD, SVG to PD was patent but the continuation of this graft to the PL branch was occluded; there were L to R collaterals; Mid LAD had a 60 to 70% stenosis. She has been treated medically.  Neg Myoview 05/2011  . Osteoarthritis of back   . Osteoporosis   . Pneumonia "several times"  . Pulmonary embolism ([place]) 1999   "after my bypass"  . PVD (peripheral vascular disease) ([place])   . Shortness of breath   . Squamous carcinoma    "nose"  . Thyroid disease    Hypothyroid  . Unstable angina ([place]) 09/25/2015   Family History  Problem Relation Age of Onset  . Heart attack Mother 72  . Diabetes Mother   . Colon cancer Father 63  . Lung cancer Father        smoked  . Heart disease Father   . Hypertension Father   . Angina Father   . Lung cancer Sister        smoked  . Hypothyroidism Sister   . Hypertension Brother   . Heart attack Brother   . Heart disease Brother        PTCA  with Stent  . Heart attack Brother        CABG  . Heart disease Brother        CABG with 1 bypass  . Aneurysm Brother        brain  . Alcohol abuse Brother   . Hyperlipidemia Daughter   . Diabetes Maternal Grandmother   . Stroke Maternal Grandmother   . Cirrhosis Maternal Grandfather   . Stroke Paternal Grandmother   . Obesity Daughter   . Obesity Son    Past Surgical History:  Procedure Laterality Date  . ABDOMINAL AORTIC ANEURYSM REPAIR     pt denies this hx on 01/02/2016  . BASAL CELL CARCINOMA EXCISION    . CARDIAC CATHETERIZATION N/A 01/02/2016   Procedure: Left Heart Cath and Coronary Angiography;  Surgeon: Wellington Hampshire, MD;  Location: [place] [facility];  Service: Cardiovascular;  Laterality: N/A;  . CARDIAC CATHETERIZATION  1996; 1999  . CARDIAC CATHETERIZATION N/A 06/25/2016   Procedure: Left Heart Cath and Cors/Grafts Angiography;  Surgeon: Wellington Hampshire, MD;  Location: [place] [facility];  Service:  Cardiovascular;  Laterality: N/A;  . CAROTID ENDARTERECTOMY Left 01/02/2011  . CATARACT EXTRACTION W/ INTRAOCULAR LENS  IMPLANT, BILATERAL    . CLOSED REDUCTION NASAL FRACTURE  11/2007  . CORONARY ANGIOPLASTY WITH STENT PLACEMENT  10/10/2011   drug eluting  to rc & saphenous  . CORONARY ARTERY BYPASS GRAFT  1999   "CABG X3"  . DILATION AND CURETTAGE OF UTERUS    . FEMORAL ARTERY STENT Bilateral   . FRACTURE SURGERY    . LEFT HEART CATHETERIZATION WITH CORONARY/GRAFT ANGIOGRAM N/A 04/22/2011   Procedure: LEFT HEART CATHETERIZATION WITH Beatrix Fetters;  Surgeon: Jolaine Artist, MD;  Location: [facility] CATH LAB;  Service: Cardiovascular;  Laterality: N/A;  . LEFT HEART CATHETERIZATION WITH CORONARY/GRAFT ANGIOGRAM N/A 10/10/2011   Procedure: LEFT HEART CATHETERIZATION WITH Beatrix Fetters;  Surgeon: Peter M Martinique, MD;  Location: [facility] CATH LAB;  Service: Cardiovascular;  Laterality: N/A;  . NASAL SINUS SURGERY     twice  . SQUAMOUS CELL CARCINOMA EXCISION    . SVT ABLATION N/A 03/19/2018   Procedure: SVT ABLATION;  Surgeon: Constance Haw, MD;  Location: [place] [facility];  Service: Cardiovascular;  Laterality: N/A;  . TUBAL LIGATION     Patient Active Problem List   Diagnosis Date Noted  . COPD with acute exacerbation ([place]) 08/12/2019  . Hypoxia 08/12/2019  . Atrial fibrillation, chronic ([place]) 08/12/2019  . OSA (obstructive sleep apnea) 08/12/2019  . Vitamin D deficiency 06/13/2019  . Witnessed episode of apnea 03/07/2018  . Cervical cancer screening 12/09/2017  . Preventative health care 12/09/2017  . Chronic ethmoidal sinusitis 09/04/2017  . Hypothyroidism 09/30/2016  . Presbycusis of both ears 08/05/2016  . Schwannoma of cranial nerve ([place]) 08/05/2016  . 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 Cramps, extremity 07/03/2016  . 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 Flank pain 07/03/2016  . Stable angina ([place]) 06/17/2016  . Hx of CABG 12/27/2015  .  Hyperglycemia 10/23/2015  . Unstable angina ([place]) 09/25/2015  . Upper airway cough syndrome 03/23/2015  . Hypokalemia 07/24/2014  . Cystitis 04/11/2014  . Baker's cyst of knee 01/30/2014  . Thoracic back pain 01/24/2014  . Breast cancer screening 07/31/2013  . Shortness of breath 05/09/2013  . Anosmia   . Fatigue 09/17/2011  . CAD (coronary artery disease) 06/06/2011  . NSTEMI (non-ST elevated myocardial infarction) ([place]) 04/22/2011  . Excessive somnolence disorder  12/03/2010  . BACK PAIN, LUMBAR 07/16/2010  . SINUSITIS, CHRONIC 05/07/2010  . Cough 09/10/2009  . SKIN CANCER, HX OF 04/03/2009  . Coronary artery disease involving coronary bypass graft of native heart with angina pectoris ([place]) 01/24/2009  . Carotid artery stenosis 01/24/2009  . Atherosclerosis of renal artery ([place]) 01/24/2009  . 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  . AORTIC ANEUR [place] WITHOUT MENTION RUPTURE 01/24/2009  . POST-OP CARDIAC COMPLICATION 30/16/0109  . Hyperlipidemia 12/22/2006  . Depression, recurrent ([place]) 12/22/2006  . HTN (hypertension) 12/22/2006  . PERIPHERAL VASCULAR DISEASE 12/22/2006  . COPD GOLD II 12/22/2006  . GERD 12/22/2006  . Osteoporosis 12/22/2006      Prior to Admission medications   Medication Sig Start Date End Date Taking? Authorizing Provider  acetaminophen (TYLENOL) 650 MG CR tablet Take 1,300 mg by mouth 2 (two) times daily.   Yes [provider]  albuterol (PROVENTIL) (2.5 MG/3ML) 0.083% nebulizer solution Take 3 mLs (2.5 mg total) by nebulization every 4 (four) hours as needed for wheezing or shortness of breath. Dx:J44.9 03/25/19  Yes Kasa, Maretta Bees, MD  albuterol (VENTOLIN HFA) 108 (90 Base) MCG/ACT inhaler INHALE 2 PUFFS INTO THE LUNGS EVERY 6 (SIX) HOURS AS NEEDED FOR WHEEZING. Patient taking differently: Inhale 2 puffs into the lungs every 6 (six) hours as needed for wheezing or shortness of breath.  06/13/19  Yes Mosie Lukes, MD  amLODipine (NORVASC)  5 MG tablet TAKE 1 TABLET BY MOUTH ONCE DAILY Patient taking differently: Take 5 mg by mouth daily.  05/31/19  Yes Minna Merritts, MD  azelastine (ASTELIN) 0.1 % nasal spray Place 2 sprays into both nostrils at bedtime as needed for rhinitis. 06/28/18  Yes Mosie Lukes, MD  Biotin 5 MG TABS Take 5 mg by mouth daily.    Yes [provider]  Calcium Carbonate-Vitamin D (CALCIUM + D PO) Take 1 tablet by mouth daily.    Yes [provider]  Cholecalciferol (VITAMIN D3) 2000 units TABS Take 2,000 Units by mouth daily.    Yes [provider]  clopidogrel (PLAVIX) 75 MG tablet TAKE 1 TABLET BY MOUTH ONCE A DAY Patient taking differently: Take 75 mg by mouth daily.  07/19/19  Yes Minna Merritts, MD  diltiazem (CARDIZEM CD) 240 MG 24 hr capsule Take 1 capsule (240 mg total) by mouth daily. Pt must keep appt in May to continue receiving refills 07/28/19  Yes Camnitz, Ocie Doyne, MD  diltiazem (CARDIZEM) 30 MG tablet Take one tablet by mouth every 4 hours AS NEEDED for A-fib HR > 100 as long as BP > 100. Patient taking differently: Take 30 mg by mouth every 4 (four) hours as needed (aFib [HR >100 and if BP >100]).  11/11/18  Yes Fenton, Clint R, PA  ezetimibe (ZETIA) 10 MG tablet TAKE 1 TABLET BY MOUTH DAILY Patient taking differently: Take 10 mg by mouth at bedtime.  07/25/19  Yes Minna Merritts, MD  furosemide (LASIX) 20 MG tablet Take 20 mg by mouth every Monday, Wednesday, and Friday.    Yes [provider]  HYDROcodone-acetaminophen (NORCO) 10-325 MG tablet Take 1 tablet by mouth every 6 (six) hours as needed for severe pain.   Yes [provider]  isosorbide mononitrate (IMDUR) 60 MG 24 hr tablet TAKE 1 TABLET BY MOUTH TWICE A DAY Patient taking differently: Take 60 mg by mouth 2 (two) times daily.  05/31/19  Yes Gollan, Kathlene November, MD  Krill Oil 500 MG CAPS  Take 500 mg by mouth daily.    Yes [provider]  levothyroxine (SYNTHROID) 50 MCG tablet  TAKE 1 TABLET BY MOUTH ONCE DAILY Patient taking differently: Take 50 mcg by mouth daily before breakfast.  07/25/19  Yes Mosie Lukes, MD  nitroGLYCERIN (NITROSTAT) 0.4 MG SL tablet PLACE 1 TABLET (0.4 MG TOTAL) UNDER THE TONGUE EVERY 5 (FIVE) MINUTES AS NEEDED FOR CHEST PAIN. 02/01/18  Yes Gollan, Kathlene November, MD  omeprazole (PRILOSEC) 20 MG capsule TAKE 1 CAPSULE BY MOUTH ONCE DAILY Patient taking differently: Take 20 mg by mouth daily.  01/26/19  Yes Mosie Lukes, MD  Polyethyl Glycol-Propyl Glycol (SYSTANE OP) Place 1 drop into both eyes 2 (two) times a day.    Yes [provider]  rosuvastatin (CRESTOR) 40 MG tablet TAKE 1 TABLET BY MOUTH ONCE A DAY Patient taking differently: Take 40 mg by mouth at bedtime.  09/01/18  Yes Minna Merritts, MD  sertraline (ZOLOFT) 100 MG tablet Take 1 tablet (100 mg total) by mouth daily. 06/13/19  Yes Mosie Lukes, MD  STIOLTO RESPIMAT 2.5-2.5 MCG/ACT AERS INHALE 2 PUFFS INTO THE LUNGS ONCE DAILY Patient taking differently: Inhale 2 puffs into the lungs daily.  05/02/19  Yes Kasa, Maretta Bees, MD  Vitamin D, Ergocalciferol, (DRISDOL) 1.25 MG (50000 UT) CAPS capsule TAKE 1 CAPSULE BY MOUTH ONCE A WEEK 11/24/18  Yes Mosie Lukes, MD  XARELTO 15 MG TABS tablet TAKE 1 TABLET BY MOUTH ONCE A DAY WITH SUPPER Patient taking differently: Take 15 mg by mouth at bedtime.  05/31/19  Yes Fenton, Clint R, PA  zinc gluconate 50 MG tablet Take 50 mg by mouth daily.   Yes [provider]    Allergies Erythromycin, Meperidine, Shellfish allergy, Ciprofloxacin, Codeine, Penicillins, and Tape    Social History Social History   Tobacco Use  . Smoking status: Former Smoker    Packs/day: 0.50    Years: 40.00    Pack years: 20.00    Types: Cigarettes    Quit date: 01/19/1993    Years since quitting: 26.5  . Smokeless tobacco: Never Used  Substance Use Topics  . Alcohol use: Yes    Comment: 01/02/2016 "might drink a glass of wine socially q 3-4 months"  .  Drug use: No    Review of Systems Patient denies headaches, rhinorrhea, blurry vision, numbness, shortness of breath, chest pain, edema, cough, abdominal pain, nausea, vomiting, diarrhea, dysuria, fevers, rashes or hallucinations unless otherwise stated above in HPI. ____________________________________________   PHYSICAL EXAM:  VITAL SIGNS: Vitals:   08/12/19 2300 08/12/19 2315  BP: 118/71 128/78  Pulse: 86 (!) 122  Resp:  19  Temp:    SpO2: 91% 94%    Constitutional: Alert and oriented.  Eyes: Conjunctivae are normal.  Head: Atraumatic. Nose: No congestion/rhinnorhea. Mouth/Throat: Mucous membranes are moist.   Neck: No stridor. Painless ROM.  Cardiovascular: Normal rate, regular rhythm. Grossly normal heart sounds.  Good peripheral circulation. Respiratory: mild tachypnea with faint end expiratory wheeze and prolonged expiratory phase Gastrointestinal: Soft and nontender. No distention. No abdominal bruits. No CVA tenderness. Genitourinary:  Musculoskeletal: No lower extremity tenderness nor edema.  No joint effusions. Neurologic:  Normal speech and language. No gross focal neurologic deficits are appreciated. No facial droop Skin:  Skin is warm, dry and intact. No rash noted. Psychiatric: Mood and affect are normal. Speech and behavior are normal.  ____________________________________________   LABS (all labs ordered are listed, but only abnormal results  are displayed)  Results for orders placed or performed during the hospital encounter of 08/12/19 (from the past 24 hour(s))  Basic metabolic panel     Status: Abnormal   Collection Time: 08/12/19 12:38 PM  Result Value Ref Range   Sodium 142 135 - 145 mmol/L   Potassium 3.9 3.5 - 5.1 mmol/L   Chloride 106 98 - 111 mmol/L   CO2 25 22 - 32 mmol/L   Glucose, Bld 97 70 - 99 mg/dL   BUN 15 8 - 23 mg/dL   Creatinine, Ser 0.93 0.44 - 1.00 mg/dL   Calcium 9.2 8.9 - 10.3 mg/dL   GFR calc non Af Amer 58 (L) >60 mL/min    GFR calc Af Amer >60 >60 mL/min   Anion gap 11 5 - 15  CBC     Status: Abnormal   Collection Time: 08/12/19 12:38 PM  Result Value Ref Range   WBC 6.0 4.0 - 10.5 K/uL   RBC 4.74 3.87 - 5.11 MIL/uL   Hemoglobin 12.0 12.0 - 15.0 g/dL   HCT 38.8 36.0 - 46.0 %   MCV 81.9 80.0 - 100.0 fL   MCH 25.3 (L) 26.0 - 34.0 pg   MCHC 30.9 30.0 - 36.0 g/dL   RDW 14.8 11.5 - 15.5 %   Platelets 160 150 - 400 K/uL   nRBC 0.0 0.0 - 0.2 %  Troponin I (High Sensitivity)     Status: None   Collection Time: 08/12/19 12:38 PM  Result Value Ref Range   Troponin I (High Sensitivity) 7 <18 ng/L  Brain natriuretic peptide     Status: Abnormal   Collection Time: 08/12/19 12:38 PM  Result Value Ref Range   B Natriuretic Peptide 337.0 (H) 0.0 - 100.0 pg/mL  Troponin I (High Sensitivity)     Status: None   Collection Time: 08/12/19  2:47 PM  Result Value Ref Range   Troponin I (High Sensitivity) 7 <18 ng/L  Blood gas, venous     Status: Abnormal   Collection Time: 08/12/19  8:25 PM  Result Value Ref Range   pH, Ven 7.38 7.250 - 7.430   pCO2, Ven 53 44.0 - 60.0 mmHg   pO2, Ven <31.0 (LL) 32.0 - 45.0 mmHg   Bicarbonate 31.4 (H) 20.0 - 28.0 mmol/L   Acid-Base Excess 4.9 (H) 0.0 - 2.0 mmol/L   O2 Saturation 41.2 %   Patient temperature 37.0    Collection site VENOUS    Sample type VENOUS   POC SARS Coronavirus 2 Ag     Status: None   Collection Time: 08/12/19  8:53 PM  Result Value Ref Range   SARS Coronavirus 2 Ag NEGATIVE NEGATIVE   ____________________________________________  EKG My review and personal interpretation at Time: 12:42   Indication: sob  Rate: 90  Rhythm: sinus Axis: normal Other: normal intervals, inferolateral t wave inversions consistent with previous ____________________________________________  RADIOLOGY  I personally reviewed all radiographic images ordered to evaluate for the above acute complaints and reviewed radiology reports and findings.  These findings were personally  discussed with the patient.  Please see medical record for radiology report.  ____________________________________________   PROCEDURES  Procedure(s) performed:  .Critical Care Performed by: Merlyn Lot, MD Authorized by: Merlyn Lot, MD   Critical care provider statement:    Critical care time (minutes):  30   Critical care time was exclusive of:  Separately billable procedures and treating other patients   Critical care was necessary to treat or  prevent imminent or life-threatening deterioration of the following conditions:  Respiratory failure   Critical care was time spent personally by me on the following activities:  Development of treatment plan with patient or surrogate, discussions with consultants, evaluation of patient's response to treatment, examination of patient, obtaining history from patient or surrogate, ordering and performing treatments and interventions, ordering and review of laboratory studies, ordering and review of radiographic studies, pulse oximetry, re-evaluation of patient's condition and review of old charts      Critical Care performed: yes ____________________________________________   INITIAL IMPRESSION / [place] / ED COURSE  Pertinent labs & imaging results that were available during my care of the patient were reviewed by me and considered in my medical decision making (see chart for details).   DDX: Asthma, copd, CHF, pna, ptx, malignancy, Pe, anemia   Andrea [name] is a 79 y.o. who presents to the ED with symptoms as described above.  Patient noted be hypoxic with ambulation to the room but with rest O2 sats recover quickly.  She does have faint expiratory wheeze suspect this is related to COPD.  Blood work does not show any evidence of cardiac damage with serial enzymes being negative and EKG consistent with previous.  Doubt PE as she is on Xarelto and states that she is been compliant with these medications.  Have a lower  suspicion for CHF.  Will give nebulizers and steroids and reassess.  Clinical Course as of Aug 11 2344  Fri Aug 12, 2019  1940 Patient with some improvement after nebulizers but still hypoxic down to 86-87% with only short ambulation.  Feels severe exertional dyspnea.  Based on her hypoxic respiratory failure will will consult hospitalist for admission.  We will continue with nebulizers.   [PR]    Clinical Course User Index [PR] Merlyn Lot, MD    The patient was evaluated in Emergency Department today for the symptoms described in the history of present illness. He/she was evaluated in the context of the global COVID-19 pandemic, which necessitated consideration that the patient might be at risk for infection with the SARS-CoV-2 virus that causes COVID-19. Institutional protocols and algorithms that pertain to the evaluation of patients at risk for COVID-19 are in a state of rapid change based on information released by regulatory bodies including the CDC and federal and state organizations. These policies and algorithms were followed during the patient's care in the ED.  As part of my medical decision making, I reviewed the following data within the [place] notes reviewed and incorporated, Labs reviewed, notes from prior ED visits and [place] Controlled Substance Database   ____________________________________________   FINAL CLINICAL IMPRESSION(S) / ED DIAGNOSES  Final diagnoses:  Acute respiratory failure with hypoxia ([place])  Chronic obstructive pulmonary disease with acute exacerbation ([place])      NEW MEDICATIONS STARTED DURING THIS VISIT:  New Prescriptions   No medications on file     Note:  This document was prepared using Dragon voice recognition software and may include unintentional dictation errors.    Merlyn Lot, MD 08/12/19 [phone]

## 2019-08-12 NOTE — ED Notes (Signed)
VS obtained by this RN. Pt remains alert and oriented at this time. This RN apologized and explained delay to patient who states understanding at this time.

## 2019-08-12 NOTE — ED Triage Notes (Addendum)
Pt comes via POV from home with c/o SOB and left sided chest pressure that has been ongoing for awhile  Pt states fatigue. Pt denies any N/V/D.  Current O2 RA-95%

## 2019-08-12 NOTE — Telephone Encounter (Signed)
I spoke with the patient.  She states she has been extremely SOB with all activity and exhausted since last week. She has had occasional left sided chest discomfort that is intermittent and fleeting to the point it doesn't last long enough to take a NTG.  She intermittently feels tight around her belly. She has a cough, but this is not new for her due to her COPD/ asthma- cough is stable. The patient did wake up SOB this morning. She denies any radiating chest pain/ nausea/ vomiting/ diaphoresis. She does have some back pain in her waist area and above.   The patient has a history of CABG/ stenting. I asked if symptoms are similar to what she felt prior to her previous CABG/ stenting. The patient states that SOB is similar. She advises she is also having wheezing before/ after inhalers.  I have reviewed the above information with Andrea Montana, NP and it is advised the patient report to the ER as we have no openings in the office today/ see her PCP.  I have notified the patient of the above recommendations as we cannot differentiate  if her symptoms are Cardiac or Pulmonary in nature. The patient voices understanding and is agreeable.

## 2019-08-12 NOTE — ED Notes (Signed)
Admitting provider at bedside. Ambulating sats dropped to 87%

## 2019-08-12 NOTE — ED Notes (Signed)
Patient provided with meal tray and water, tolerated well

## 2019-08-12 NOTE — Telephone Encounter (Signed)
  Pt c/o of Chest Pain: STAT if CP now or developed within 24 hours  1. Are you having CP right now? no  2. Are you experiencing any other symptoms (ex. SOB, nausea, vomiting, sweating)? SOB  3. How long have you been experiencing CP? Last 3 or 4 days  4. Is your CP continuous or coming and going? Comes and goes  5. Have you taken Nitroglycerin? Yes, has not helped much   Patient states she has been SOB for about a week and has been extremely exhausted. She states she has also been having chest pressure on her left side for the last 3 or 4 days and her HR has been all over the place. She says her HR now is 107. Please advise.

## 2019-08-13 ENCOUNTER — Encounter: Payer: Self-pay | Admitting: Obstetrics and Gynecology

## 2019-08-13 LAB — SARS CORONAVIRUS 2 (TAT 6-24 HRS): SARS Coronavirus 2: NEGATIVE

## 2019-08-13 MED ORDER — HYDROCOD POLST-CPM POLST ER 10-8 MG/5ML PO SUER
5.0000 mL | Freq: Two times a day (BID) | ORAL | Status: DC | PRN
  Administered 2019-08-13 – 2019-08-16 (×6): 5 mL via ORAL
  Filled 2019-08-13 (×6): qty 5

## 2019-08-13 MED ORDER — FUROSEMIDE 20 MG PO TABS
20.0000 mg | ORAL_TABLET | Freq: Every day | ORAL | Status: DC
  Administered 2019-08-13 – 2019-08-14 (×2): 20 mg via ORAL
  Filled 2019-08-13 (×3): qty 1

## 2019-08-13 NOTE — Progress Notes (Signed)
Morning medications administered per orders. Pt. Has completed breakfast. Denies any current needs. Call light is within reach, will continue to monitor.

## 2019-08-13 NOTE — ED Notes (Signed)
Patient awake, resting in bed. Denies any needs at this time

## 2019-08-13 NOTE — Progress Notes (Signed)
Bedside shift report received from [place], [place]. Morning assessment completed. Pt. Resting in bed, arouses easily to name calling. Pt. Denies any pain or needs at this time. Call light is within reach, will continue to monitor.

## 2019-08-13 NOTE — Progress Notes (Signed)
Assisted patient to restroom. Administered scheduled breathing treatment. Patient tolerated it well. Currently states no pain or discomfort. Will continue to monitor to end of shift.

## 2019-08-13 NOTE — Progress Notes (Signed)
Patient frequently coughing, notified physician. Adminitered cough med per PRN order. Will continue to monitor to end of shift.

## 2019-08-13 NOTE — Plan of Care (Signed)
  Problem: Education: Goal: Knowledge of [place] [facility] [facility] Education information/materials will improve Outcome: Progressing Goal: Emotional status will improve Outcome: Progressing Goal: Mental status will improve Outcome: Progressing Goal: Verbalization of understanding the information provided will improve Outcome: Progressing   Problem: Activity: Goal: Interest or engagement in activities will improve Outcome: Progressing Goal: Sleeping patterns will improve Outcome: Progressing   

## 2019-08-13 NOTE — Progress Notes (Signed)
cpap refused 

## 2019-08-13 NOTE — Progress Notes (Signed)
PROGRESS NOTE    Andrea [name]  NFA:[id number] DOB: 01-May-1941 DOA: 08/12/2019 PCP: Mosie Lukes, MD    Brief Narrative:  Andrea [name] is a 78 y.o. female with medical history significant for copd, CAD s/p cag 1999 and cardiac stents 2013 and 2017, AAA, carotid artery stenosis, major depressive disorder, htn, hypothyroid, a-fib, pulmonary hypertension, who presents with sob    Consultants:   None  Procedures: None  Antimicrobials:   Azithromycin   Subjective: Feels about the same.  Shortness of breath not at baseline no other complaints.  No chest pain  Objective: Vitals:   08/13/19 0609 08/13/19 0736 08/13/19 0908 08/13/19 1330  BP: 135/83  118/77   Pulse: 85  (!) 101   Resp: 17     Temp: (!) 97.5 F (36.4 C)  98.2 F (36.8 C)   TempSrc:   Oral   SpO2: 99% 98% 96% 98%  Weight:      Height:        Intake/Output Summary (Last 24 hours) at 08/13/2019 1442 Last data filed at 08/13/2019 0200 Gross per 24 hour  Intake 240 ml  Output --  Net 240 ml   Filed Weights   08/12/19 1236  Weight: 68 kg    Examination:  General exam: Appears calm and comfortable  Respiratory system: Clear to auscultation. Respiratory effort normal. Cardiovascular system: S1 & S2 heard, RRR. No murmurs, rubs, gallops or clicks.  Gastrointestinal system: Abdomen is nondistended, soft and nontender.  Normal bowel sounds heard. Central nervous system: Alert and oriented. No focal neurological deficits. Extremities: mild edema Skin:warn Psychiatry: Judgement and insight appear normal. Mood & affect appropriate.     Data Reviewed: I have personally reviewed following labs and imaging studies  CBC: Recent Labs  Lab 08/12/19 1238  WBC 6.0  HGB 12.0  HCT 38.8  MCV 81.9  PLT 469   Basic Metabolic Panel: Recent Labs  Lab 08/12/19 1238  NA 142  K 3.9  CL 106  CO2 25  GLUCOSE 97  BUN 15  CREATININE 0.93  CALCIUM 9.2   GFR: Estimated Creatinine Clearance: 43.6 mL/min  (by C-G formula based on SCr of 0.93 mg/dL). Liver Function Tests: No results for input(s): AST, ALT, ALKPHOS, BILITOT, PROT, ALBUMIN in the last 168 hours. No results for input(s): LIPASE, AMYLASE in the last 168 hours. No results for input(s): AMMONIA in the last 168 hours. Coagulation Profile: No results for input(s): INR, PROTIME in the last 168 hours. Cardiac Enzymes: No results for input(s): CKTOTAL, CKMB, CKMBINDEX, TROPONINI in the last 168 hours. BNP (last 3 results) Recent Labs    03/22/19 1131  PROBNP 567   HbA1C: No results for input(s): HGBA1C in the last 72 hours. CBG: No results for input(s): GLUCAP in the last 168 hours. Lipid Profile: No results for input(s): CHOL, HDL, LDLCALC, TRIG, CHOLHDL, LDLDIRECT in the last 72 hours. Thyroid Function Tests: No results for input(s): TSH, T4TOTAL, FREET4, T3FREE, THYROIDAB in the last 72 hours. Anemia Panel: No results for input(s): VITAMINB12, FOLATE, FERRITIN, TIBC, IRON, RETICCTPCT in the last 72 hours. Sepsis Labs: No results for input(s): PROCALCITON, LATICACIDVEN in the last 168 hours.  Recent Results (from the past 240 hour(s))  SARS CORONAVIRUS 2 (TAT 6-24 HRS) Nasopharyngeal Nasopharyngeal Swab     Status: None   Collection Time: 08/12/19  8:25 PM   Specimen: Nasopharyngeal Swab  Result Value Ref Range Status   SARS Coronavirus 2 NEGATIVE NEGATIVE Final    Comment: (NOTE) SARS-CoV-2  target nucleic acids are NOT DETECTED. The SARS-CoV-2 RNA is generally detectable in upper and lower respiratory specimens during the acute phase of infection. Negative results do not preclude SARS-CoV-2 infection, do not rule out co-infections with other pathogens, and should not be used as the sole basis for treatment or other patient management decisions. Negative results must be combined with clinical observations, patient history, and epidemiological information. The expected result is Negative. Fact Sheet for  Patients: SugarRoll.be Fact Sheet for Healthcare Providers: https://www.woods-mathews.com/ This test is not yet approved or cleared by the [place] FDA and  has been authorized for detection and/or diagnosis of SARS-CoV-2 by FDA under an Emergency Use Authorization (EUA). This EUA will remain  in effect (meaning this test can be used) for the duration of the COVID-19 declaration under Section 56 4(b)(1) of the Act, 21 U.S.C. section 360bbb-3(b)(1), unless the authorization is terminated or revoked sooner. Performed at [facility], [place] [address], [place], [place] [address]          Radiology Studies: DG Chest 2 View  Result Date: 08/12/2019 CLINICAL DATA:  Worsening shortness of breath and LEFT-sided chest pressure, fatigue, history coronary artery disease post stenting and bypass surgery, hypertension, COPD, former smoker EXAM: CHEST - 2 VIEW COMPARISON:  04/08/2019 FINDINGS: Enlargement of cardiac silhouette post median sternotomy. Mediastinal contours and pulmonary vascularity normal. Atherosclerotic calcification aorta. Emphysematous and bronchitic changes consistent with COPD. Biapical scarring and chronic interstitial prominence stable. No acute infiltrate, pleural effusion, or pneumothorax. Bones demineralized. IMPRESSION: Enlargement of cardiac silhouette post median sternotomy. COPD changes with biapical scarring and chronic interstitial prominence. No acute abnormalities. Aortic Atherosclerosis (ICD10-I70.0) and Emphysema (ICD10-J43.9). Electronically Signed   By: Lavonia Dana M.D.   On: 08/12/2019 13:12        Scheduled Meds: . azithromycin  250 mg Oral Daily  . clopidogrel  75 mg Oral Daily  . diltiazem  240 mg Oral Daily  . ezetimibe  10 mg Oral Daily  . [START ON 08/15/2019] furosemide  20 mg Oral Q M,W,F  . ipratropium-albuterol  3 mL Nebulization Q6H  . isosorbide mononitrate  60 mg Oral BID  . levothyroxine  50  mcg Oral Daily  . pantoprazole  40 mg Oral Daily  . predniSONE  40 mg Oral Q breakfast  . Rivaroxaban  15 mg Oral Q supper  . rosuvastatin  40 mg Oral Daily  . sertraline  100 mg Oral Daily  . sodium chloride flush  3 mL Intravenous Q12H   Continuous Infusions: . sodium chloride      Assessment & Plan:   Principal Problem:   COPD with acute exacerbation (HCC) Active Problems:   Depression, recurrent (HCC)   HTN (hypertension)   Coronary artery disease involving coronary bypass graft of native heart with angina pectoris (HCC)   PERIPHERAL VASCULAR DISEASE   Hypoxia   Atrial fibrillation, chronic (HCC)   OSA (obstructive sleep apnea)   # COPD with acute exacerbation # Hypoxia - here well appearing in bed, no respiratory distress. Per ED desat with minimal exertion and pt reports history of significant dyspnea with exertion. No chest pain and cardiac biomarkers are normal. bnp mildly elevated; no signs CHF clinically or on CXR. Unlikely PE given concurrent xarelto use. Unlikely covid given vaccination status and lack of covid contacts. - continue prednisone - continue azithromycin  - duonebs q6, albuterol q2 prn - hold home stiolto while on duonebs - [place] O2, wean as able - covid neg.  Change lasix to daily instead of 3x/wek  # OSA - cont qhs cpap  # MDD - cont home sertraline  # a-fib - here in a fib, w/o rvr - cont home diltiazem, xarelto  # htn - here bp wnl - cont home dilt, hold home amlodipine given concurrent dilt  # CAD - cont home zetia and plavix and rosuvastatin - confirmed w/ pt cardiology has her taking both plavix and xarelto  # chronic pain - says uses home hydrocodone sparingly - cont prn hydrocodone  # phtn - cont home imdur, m/w/f lasix  # hypothyroid - cont home levothyroxine   DVT prophylaxis: Xarelto Code Status: DNR Family Communication: None at bedside Disposition Plan: Back home Barrier still unable to wean her off of oxygen  and not at baseline dyspnea on exertion.  We will likely DC in a.m.       LOS: 0 days   Time spent: 45 min with >50% on coc    Nolberto Hanlon, MD [facility] Pager [phone]  If 7PM-7AM, please contact night-coverage [email or web address] Password [email or web address] 08/13/2019, 2:42 PM

## 2019-08-14 DIAGNOSIS — J9601 Acute respiratory failure with hypoxia: Secondary | ICD-10-CM | POA: Diagnosis present

## 2019-08-14 DIAGNOSIS — M5126 Other intervertebral disc displacement, lumbar region: Secondary | ICD-10-CM | POA: Diagnosis present

## 2019-08-14 DIAGNOSIS — I480 Paroxysmal atrial fibrillation: Secondary | ICD-10-CM | POA: Diagnosis not present

## 2019-08-14 DIAGNOSIS — M479 Spondylosis, unspecified: Secondary | ICD-10-CM | POA: Diagnosis present

## 2019-08-14 DIAGNOSIS — I509 Heart failure, unspecified: Secondary | ICD-10-CM | POA: Diagnosis not present

## 2019-08-14 DIAGNOSIS — I5033 Acute on chronic diastolic (congestive) heart failure: Secondary | ICD-10-CM | POA: Diagnosis present

## 2019-08-14 DIAGNOSIS — R0602 Shortness of breath: Secondary | ICD-10-CM | POA: Diagnosis present

## 2019-08-14 DIAGNOSIS — I361 Nonrheumatic tricuspid (valve) insufficiency: Secondary | ICD-10-CM | POA: Diagnosis not present

## 2019-08-14 DIAGNOSIS — I714 Abdominal aortic aneurysm, without rupture: Secondary | ICD-10-CM | POA: Diagnosis present

## 2019-08-14 DIAGNOSIS — I482 Chronic atrial fibrillation, unspecified: Secondary | ICD-10-CM | POA: Diagnosis not present

## 2019-08-14 DIAGNOSIS — G8929 Other chronic pain: Secondary | ICD-10-CM | POA: Diagnosis present

## 2019-08-14 DIAGNOSIS — R0600 Dyspnea, unspecified: Secondary | ICD-10-CM | POA: Diagnosis not present

## 2019-08-14 DIAGNOSIS — I4819 Other persistent atrial fibrillation: Secondary | ICD-10-CM | POA: Diagnosis present

## 2019-08-14 DIAGNOSIS — Z20822 Contact with and (suspected) exposure to covid-19: Secondary | ICD-10-CM | POA: Diagnosis present

## 2019-08-14 DIAGNOSIS — Z85828 Personal history of other malignant neoplasm of skin: Secondary | ICD-10-CM | POA: Diagnosis not present

## 2019-08-14 DIAGNOSIS — I252 Old myocardial infarction: Secondary | ICD-10-CM | POA: Diagnosis not present

## 2019-08-14 DIAGNOSIS — J449 Chronic obstructive pulmonary disease, unspecified: Secondary | ICD-10-CM | POA: Insufficient documentation

## 2019-08-14 DIAGNOSIS — Z86711 Personal history of pulmonary embolism: Secondary | ICD-10-CM | POA: Diagnosis not present

## 2019-08-14 DIAGNOSIS — Z66 Do not resuscitate: Secondary | ICD-10-CM | POA: Diagnosis present

## 2019-08-14 DIAGNOSIS — M810 Age-related osteoporosis without current pathological fracture: Secondary | ICD-10-CM | POA: Diagnosis present

## 2019-08-14 DIAGNOSIS — J441 Chronic obstructive pulmonary disease with (acute) exacerbation: Secondary | ICD-10-CM | POA: Diagnosis present

## 2019-08-14 DIAGNOSIS — E039 Hypothyroidism, unspecified: Secondary | ICD-10-CM | POA: Diagnosis present

## 2019-08-14 DIAGNOSIS — I340 Nonrheumatic mitral (valve) insufficiency: Secondary | ICD-10-CM | POA: Diagnosis not present

## 2019-08-14 DIAGNOSIS — F329 Major depressive disorder, single episode, unspecified: Secondary | ICD-10-CM | POA: Diagnosis present

## 2019-08-14 DIAGNOSIS — G4733 Obstructive sleep apnea (adult) (pediatric): Secondary | ICD-10-CM | POA: Diagnosis present

## 2019-08-14 DIAGNOSIS — I110 Hypertensive heart disease with heart failure: Secondary | ICD-10-CM | POA: Diagnosis present

## 2019-08-14 DIAGNOSIS — E785 Hyperlipidemia, unspecified: Secondary | ICD-10-CM | POA: Diagnosis present

## 2019-08-14 DIAGNOSIS — K219 Gastro-esophageal reflux disease without esophagitis: Secondary | ICD-10-CM | POA: Diagnosis present

## 2019-08-14 DIAGNOSIS — I2510 Atherosclerotic heart disease of native coronary artery without angina pectoris: Secondary | ICD-10-CM | POA: Diagnosis present

## 2019-08-14 DIAGNOSIS — I679 Cerebrovascular disease, unspecified: Secondary | ICD-10-CM | POA: Diagnosis present

## 2019-08-14 DIAGNOSIS — Z955 Presence of coronary angioplasty implant and graft: Secondary | ICD-10-CM | POA: Diagnosis not present

## 2019-08-14 DIAGNOSIS — I25709 Atherosclerosis of coronary artery bypass graft(s), unspecified, with unspecified angina pectoris: Secondary | ICD-10-CM | POA: Diagnosis present

## 2019-08-14 LAB — BASIC METABOLIC PANEL
Anion gap: 10 (ref 5–15)
BUN: 28 mg/dL — ABNORMAL HIGH (ref 8–23)
CO2: 28 mmol/L (ref 22–32)
Calcium: 9.2 mg/dL (ref 8.9–10.3)
Chloride: 102 mmol/L (ref 98–111)
Creatinine, Ser: 1.02 mg/dL — ABNORMAL HIGH (ref 0.44–1.00)
GFR calc Af Amer: >60 mL/min (ref >60)
GFR calc non Af Amer: 52 mL/min — ABNORMAL LOW (ref >60)
Glucose, Bld: 100 mg/dL — ABNORMAL HIGH (ref 70–99)
Potassium: 4.3 mmol/L (ref 3.5–5.1)
Sodium: 140 mmol/L (ref 135–145)

## 2019-08-14 LAB — BRAIN NATRIURETIC PEPTIDE: B Natriuretic Peptide: 621 pg/mL — ABNORMAL HIGH (ref 0.0–100.0)

## 2019-08-14 MED ORDER — FUROSEMIDE 20 MG PO TABS
20.0000 mg | ORAL_TABLET | Freq: Once | ORAL | Status: AC
  Administered 2019-08-14: 12:00:00 20 mg via ORAL

## 2019-08-14 NOTE — Progress Notes (Signed)
PROGRESS NOTE    Andrea [name]  ONG:[id number] DOB: February 18, 1941 DOA: 08/12/2019 PCP: Mosie Lukes, MD    Brief Narrative:  Andrea [name] is a 79 y.o. female with medical history significant for copd, CAD s/p cag 1999 and cardiac stents 2013 and 2017, AAA, carotid artery stenosis, major depressive disorder, htn, hypothyroid, a-fib, pulmonary hypertension, who presents with sob    Consultants:   None  Procedures: None  Antimicrobials:   Azithromycin   Subjective: Still dyspneic on exertion.  Shortness of breath not at baseline.  Off oxygen with ambulation dropped to 83%.  Was tachycardic.  No chest pain Objective: Vitals:   08/13/19 1959 08/13/19 2351 08/14/19 0004 08/14/19 0745  BP:  107/66  122/69  Pulse:  71 69 64  Resp:  14  18  Temp:  98.2 F (36.8 C)  97.9 F (36.6 C)  TempSrc:  Oral  Oral  SpO2: 96%  97% 98%  Weight:      Height:       No intake or output data in the 24 hours ending 08/14/19 1027 Filed Weights   08/12/19 1236  Weight: 68 kg    Examination:  General exam: Appears calm and comfortable, NAD Respiratory system: cta, no wheezing or crackles Cardiovascular system: S1 & S2 heard, RRR. No murmurs, rubs, gallops or clicks. Difficult to assess for JVD Gastrointestinal system: Abdomen is nondistended, soft and nontender.  Normal bowel sounds heard. Central nervous system: Alert and oriented. No focal neurological deficits. Extremities: mild edema b/l Skin:warn Psychiatry: Judgement and insight appear normal. Mood & affect appropriate.     Data Reviewed: I have personally reviewed following labs and imaging studies  CBC: Recent Labs  Lab 08/12/19 1238  WBC 6.0  HGB 12.0  HCT 38.8  MCV 81.9  PLT 440   Basic Metabolic Panel: Recent Labs  Lab 08/12/19 1238 08/14/19 0751  NA 142 140  K 3.9 4.3  CL 106 102  CO2 25 28  GLUCOSE 97 100*  BUN 15 28*  CREATININE 0.93 1.02*  CALCIUM 9.2 9.2   GFR: Estimated Creatinine Clearance:  39.8 mL/min (A) (by C-G formula based on SCr of 1.02 mg/dL (H)). Liver Function Tests: No results for input(s): AST, ALT, ALKPHOS, BILITOT, PROT, ALBUMIN in the last 168 hours. No results for input(s): LIPASE, AMYLASE in the last 168 hours. No results for input(s): AMMONIA in the last 168 hours. Coagulation Profile: No results for input(s): INR, PROTIME in the last 168 hours. Cardiac Enzymes: No results for input(s): CKTOTAL, CKMB, CKMBINDEX, TROPONINI in the last 168 hours. BNP (last 3 results) Recent Labs    03/22/19 1131  PROBNP 567   HbA1C: No results for input(s): HGBA1C in the last 72 hours. CBG: No results for input(s): GLUCAP in the last 168 hours. Lipid Profile: No results for input(s): CHOL, HDL, LDLCALC, TRIG, CHOLHDL, LDLDIRECT in the last 72 hours. Thyroid Function Tests: No results for input(s): TSH, T4TOTAL, FREET4, T3FREE, THYROIDAB in the last 72 hours. Anemia Panel: No results for input(s): VITAMINB12, FOLATE, FERRITIN, TIBC, IRON, RETICCTPCT in the last 72 hours. Sepsis Labs: No results for input(s): PROCALCITON, LATICACIDVEN in the last 168 hours.  Recent Results (from the past 240 hour(s))  SARS CORONAVIRUS 2 (TAT 6-24 HRS) Nasopharyngeal Nasopharyngeal Swab     Status: None   Collection Time: 08/12/19  8:25 PM   Specimen: Nasopharyngeal Swab  Result Value Ref Range Status   SARS Coronavirus 2 NEGATIVE NEGATIVE Final    Comment: (NOTE)  SARS-CoV-2 target nucleic acids are NOT DETECTED. The SARS-CoV-2 RNA is generally detectable in upper and lower respiratory specimens during the acute phase of infection. Negative results do not preclude SARS-CoV-2 infection, do not rule out co-infections with other pathogens, and should not be used as the sole basis for treatment or other patient management decisions. Negative results must be combined with clinical observations, patient history, and epidemiological information. The expected result is Negative. Fact  Sheet for Patients: SugarRoll.be Fact Sheet for Healthcare Providers: https://www.woods-mathews.com/ This test is not yet approved or cleared by the [place] FDA and  has been authorized for detection and/or diagnosis of SARS-CoV-2 by FDA under an Emergency Use Authorization (EUA). This EUA will remain  in effect (meaning this test can be used) for the duration of the COVID-19 declaration under Section 56 4(b)(1) of the Act, 21 U.S.C. section 360bbb-3(b)(1), unless the authorization is terminated or revoked sooner. Performed at [facility], [place] [address] [place], [place] [address]          Radiology Studies: DG Chest 2 View  Result Date: 08/12/2019 CLINICAL DATA:  Worsening shortness of breath and LEFT-sided chest pressure, fatigue, history coronary artery disease post stenting and bypass surgery, hypertension, COPD, former smoker EXAM: CHEST - 2 VIEW COMPARISON:  04/08/2019 FINDINGS: Enlargement of cardiac silhouette post median sternotomy. Mediastinal contours and pulmonary vascularity normal. Atherosclerotic calcification aorta. Emphysematous and bronchitic changes consistent with COPD. Biapical scarring and chronic interstitial prominence stable. No acute infiltrate, pleural effusion, or pneumothorax. Bones demineralized. IMPRESSION: Enlargement of cardiac silhouette post median sternotomy. COPD changes with biapical scarring and chronic interstitial prominence. No acute abnormalities. Aortic Atherosclerosis (ICD10-I70.0) and Emphysema (ICD10-J43.9). Electronically Signed   By: Lavonia Dana M.D.   On: 08/12/2019 13:12        Scheduled Meds: . azithromycin  250 mg Oral Daily  . clopidogrel  75 mg Oral Daily  . diltiazem  240 mg Oral Daily  . ezetimibe  10 mg Oral Daily  . furosemide  20 mg Oral Daily  . furosemide  20 mg Oral Once  . ipratropium-albuterol  3 mL Nebulization Q6H  . isosorbide mononitrate  60 mg Oral BID  .  levothyroxine  50 mcg Oral Daily  . pantoprazole  40 mg Oral Daily  . predniSONE  40 mg Oral Q breakfast  . Rivaroxaban  15 mg Oral Q supper  . rosuvastatin  40 mg Oral Daily  . sertraline  100 mg Oral Daily  . sodium chloride flush  3 mL Intravenous Q12H   Continuous Infusions: . sodium chloride      Assessment & Plan:   Principal Problem:   COPD with acute exacerbation (HCC) Active Problems:   Depression, recurrent (HCC)   HTN (hypertension)   Coronary artery disease involving coronary bypass graft of native heart with angina pectoris (HCC)   PERIPHERAL VASCULAR DISEASE   Hypoxia   Atrial fibrillation, chronic (HCC)   OSA (obstructive sleep apnea)   # COPD with acute exacerbation # Hypoxia - here well appearing in bed, no respiratory distress.  O2 saturation with exertion is 83% on room air  Mildly volume overloaded, will give extra dose of Lasix 20 mg p.o. x1 for total of  40 today. Covid negative - continue prednisone taper - continue azithromycin  - duonebs q6, albuterol q2 prn - hold home stiolto while on duonebs  # OSA - cont qhs cpap  # MDD - cont home sertraline  # a-fib - here in  a fib, w/o rvr - cont home diltiazem, xarelto  # htn - here bp wnl - cont home dilt, hold home amlodipine given concurrent dilt  # CAD - cont home zetia and plavix and rosuvastatin - confirmed w/ pt cardiology has her taking both plavix and xarelto  # chronic pain - says uses home hydrocodone sparingly - cont prn hydrocodone  # phtn - cont home imdur, m/w/f lasix  # hypothyroid - cont home levothyroxine   DVT prophylaxis: Xarelto Code Status: DNR Family Communication: None at bedside Disposition Plan: Back home Barrier still unable to wean her off of oxygen and not at baseline from respiratory status.  Mildly volume overloaded will need diuresis.  Soft blood pressure.  Possible DC in 1 to 2 days.      LOS: 0 days   Time spent: 45 min with >50% on  coc    Nolberto Hanlon, MD [facility] Pager [phone]  If 7PM-7AM, please contact night-coverage [email or web address] Password [facility] 08/14/2019, 10:27 AM [name], female   DOB: Sep 28, 1940, 79 y.o.   MRN: [id number]

## 2019-08-14 NOTE — Progress Notes (Signed)
Attempted to wean patient off O2 [place], Patient on RA was 90% O2, PR 118 and ambulated 15 feet and patient O2 sat was 83% PR 145.  MD notified by this RN. Patient returned safely to bed, and bed placed in lowest position, call light within reach, and no needs noted at this time.  This RN applied 3LNC to patient, will continue to monitor.

## 2019-08-15 ENCOUNTER — Inpatient Hospital Stay: Payer: Medicare HMO

## 2019-08-15 LAB — CBC
HCT: 34.9 % — ABNORMAL LOW (ref 36.0–46.0)
Hemoglobin: 10.7 g/dL — ABNORMAL LOW (ref 12.0–15.0)
MCH: 25.6 pg — ABNORMAL LOW (ref 26.0–34.0)
MCHC: 30.7 g/dL (ref 30.0–36.0)
MCV: 83.5 fL (ref 80.0–100.0)
Platelets: 179 10*3/uL (ref 150–400)
RBC: 4.18 MIL/uL (ref 3.87–5.11)
RDW: 14.9 % (ref 11.5–15.5)
WBC: 8.9 10*3/uL (ref 4.0–10.5)
nRBC: 0 % (ref 0.0–0.2)

## 2019-08-15 LAB — BASIC METABOLIC PANEL
Anion gap: 8 (ref 5–15)
BUN: 33 mg/dL — ABNORMAL HIGH (ref 8–23)
CO2: 28 mmol/L (ref 22–32)
Calcium: 8.8 mg/dL — ABNORMAL LOW (ref 8.9–10.3)
Chloride: 104 mmol/L (ref 98–111)
Creatinine, Ser: 0.75 mg/dL (ref 0.44–1.00)
GFR calc Af Amer: >60 mL/min (ref >60)
GFR calc non Af Amer: >60 mL/min (ref >60)
Glucose, Bld: 105 mg/dL — ABNORMAL HIGH (ref 70–99)
Potassium: 3.9 mmol/L (ref 3.5–5.1)
Sodium: 140 mmol/L (ref 135–145)

## 2019-08-15 MED ORDER — PREDNISONE 20 MG PO TABS
20.0000 mg | ORAL_TABLET | Freq: Every day | ORAL | Status: AC
  Administered 2019-08-16 – 2019-08-17 (×2): 20 mg via ORAL
  Filled 2019-08-15 (×2): qty 1

## 2019-08-15 MED ORDER — DM-GUAIFENESIN ER 30-600 MG PO TB12
1.0000 | ORAL_TABLET | Freq: Two times a day (BID) | ORAL | Status: DC
  Administered 2019-08-15 – 2019-08-17 (×5): 1 via ORAL
  Filled 2019-08-15 (×5): qty 1

## 2019-08-15 MED ORDER — FUROSEMIDE 10 MG/ML IJ SOLN
20.0000 mg | Freq: Once | INTRAMUSCULAR | Status: AC
  Administered 2019-08-15: 20 mg via INTRAVENOUS
  Filled 2019-08-15: qty 2

## 2019-08-15 NOTE — Progress Notes (Signed)
PROGRESS NOTE    Andrea [name]  SWH:[id number] DOB: 1940-07-15 DOA: 08/12/2019 PCP: Mosie Lukes, MD    Brief Narrative:  Andrea [name] is a 79 y.o. female with medical history significant for copd, CAD s/p cag 1999 and cardiac stents 2013 and 2017, AAA, carotid artery stenosis, major depressive disorder, htn, hypothyroid, a-fib, pulmonary hypertension, who presents with sob    Consultants:   None  Procedures: None  Antimicrobials:   Azithromycin   Subjective: Still sob, on [place]. Trying to wean. Feels congested, coughing. No cp. [place].  Objective: Vitals:   08/14/19 2043 08/14/19 2358 08/15/19 0734 08/15/19 0746  BP:  123/76 122/78   Pulse:  71 67   Resp:  20 20   Temp:  98.2 F (36.8 C) 98 F (36.7 C)   TempSrc:  Oral Oral   SpO2: 95% 96% 97% 96%  Weight:      Height:       No intake or output data in the 24 hours ending 08/15/19 0910 Filed Weights   08/12/19 1236  Weight: 68 kg    Examination:  General exam: Appears calm and comfortable, NAD Respiratory system:mild rhonchorus, end expiratory wheezing.  Cardiovascular system: S1 & S2 heard, RRR.+JVD No murmurs, rubs, gallops or clicks. Difficult to assess for JVD Gastrointestinal system: Abdomen is nondistended, soft and nontender.  Normal bowel sounds heard. Central nervous system: Alert and oriented. No focal neurological deficits. Extremities: mild edema b/l Skin:warn Psychiatry: Judgement and insight appear normal. Mood & affect appropriate.     Data Reviewed: I have personally reviewed following labs and imaging studies  CBC: Recent Labs  Lab 08/12/19 1238 08/15/19 0559  WBC 6.0 8.9  HGB 12.0 10.7*  HCT 38.8 34.9*  MCV 81.9 83.5  PLT 160 665   Basic Metabolic Panel: Recent Labs  Lab 08/12/19 1238 08/14/19 0751 08/15/19 0559  NA 142 140 140  K 3.9 4.3 3.9  CL 106 102 104  CO2 25 28 28   GLUCOSE 97 100* 105*  BUN 15 28* 33*  CREATININE 0.93 1.02* 0.75  CALCIUM 9.2 9.2 8.8*    GFR: Estimated Creatinine Clearance: 50.7 mL/min (by C-G formula based on SCr of 0.75 mg/dL). Liver Function Tests: No results for input(s): AST, ALT, ALKPHOS, BILITOT, PROT, ALBUMIN in the last 168 hours. No results for input(s): LIPASE, AMYLASE in the last 168 hours. No results for input(s): AMMONIA in the last 168 hours. Coagulation Profile: No results for input(s): INR, PROTIME in the last 168 hours. Cardiac Enzymes: No results for input(s): CKTOTAL, CKMB, CKMBINDEX, TROPONINI in the last 168 hours. BNP (last 3 results) Recent Labs    03/22/19 1131  PROBNP 567   HbA1C: No results for input(s): HGBA1C in the last 72 hours. CBG: No results for input(s): GLUCAP in the last 168 hours. Lipid Profile: No results for input(s): CHOL, HDL, LDLCALC, TRIG, CHOLHDL, LDLDIRECT in the last 72 hours. Thyroid Function Tests: No results for input(s): TSH, T4TOTAL, FREET4, T3FREE, THYROIDAB in the last 72 hours. Anemia Panel: No results for input(s): VITAMINB12, FOLATE, FERRITIN, TIBC, IRON, RETICCTPCT in the last 72 hours. Sepsis Labs: No results for input(s): PROCALCITON, LATICACIDVEN in the last 168 hours.  Recent Results (from the past 240 hour(s))  SARS CORONAVIRUS 2 (TAT 6-24 HRS) Nasopharyngeal Nasopharyngeal Swab     Status: None   Collection Time: 08/12/19  8:25 PM   Specimen: Nasopharyngeal Swab  Result Value Ref Range Status   SARS Coronavirus 2 NEGATIVE NEGATIVE Final  Comment: (NOTE) SARS-CoV-2 target nucleic acids are NOT DETECTED. The SARS-CoV-2 RNA is generally detectable in upper and lower respiratory specimens during the acute phase of infection. Negative results do not preclude SARS-CoV-2 infection, do not rule out co-infections with other pathogens, and should not be used as the sole basis for treatment or other patient management decisions. Negative results must be combined with clinical observations, patient history, and epidemiological information. The  expected result is Negative. Fact Sheet for Patients: SugarRoll.be Fact Sheet for Healthcare Providers: https://www.woods-mathews.com/ This test is not yet approved or cleared by the [place] FDA and  has been authorized for detection and/or diagnosis of SARS-CoV-2 by FDA under an Emergency Use Authorization (EUA). This EUA will remain  in effect (meaning this test can be used) for the duration of the COVID-19 declaration under Section 56 4(b)(1) of the Act, 21 U.S.C. section 360bbb-3(b)(1), unless the authorization is terminated or revoked sooner. Performed at [facility], [place] [address], [place], [place] [address]          Radiology Studies: No results found.      Scheduled Meds: . azithromycin  250 mg Oral Daily  . clopidogrel  75 mg Oral Daily  . dextromethorphan-guaiFENesin  1 tablet Oral BID  . diltiazem  240 mg Oral Daily  . ezetimibe  10 mg Oral Daily  . furosemide  20 mg Intravenous Once  . furosemide  20 mg Oral Daily  . ipratropium-albuterol  3 mL Nebulization Q6H  . isosorbide mononitrate  60 mg Oral BID  . levothyroxine  50 mcg Oral Daily  . pantoprazole  40 mg Oral Daily  . [START ON 08/16/2019] predniSONE  20 mg Oral Q breakfast  . Rivaroxaban  15 mg Oral Q supper  . rosuvastatin  40 mg Oral Daily  . sertraline  100 mg Oral Daily  . sodium chloride flush  3 mL Intravenous Q12H   Continuous Infusions: . sodium chloride      Assessment & Plan:   Principal Problem:   COPD with acute exacerbation (HCC) Active Problems:   Depression, recurrent (HCC)   HTN (hypertension)   Coronary artery disease involving coronary bypass graft of native heart with angina pectoris (HCC)   PERIPHERAL VASCULAR DISEASE   Hypoxia   Atrial fibrillation, chronic (HCC)   OSA (obstructive sleep apnea)   COPD (chronic obstructive pulmonary disease) ([place])   # COPD with acute exacerbation with hypoxia On azithro and  prednisone taper.  Still symptomatic. O2 saturation with exertion is 83% on room air  Covid negative Still not at baseline - continue prednisone taper - continue azithromycin  - duonebs q6, albuterol q2 prn - hold home stiolto while on duonebs -repeat cxr Has volume overload  -likely from steroids. will give lasix 20mg  iv x1 and reassess Monitor renal function Ck am bnp  # OSA - cont qhs cpap  # MDD - cont home sertraline  # a-fib - here in a fib, w/o rvr - cont home diltiazem, xarelto  # htn - here bp wnl - cont home dilt, hold home amlodipine given concurrent dilt  # CAD - cont home zetia and plavix and rosuvastatin - per cardiology has her taking both plavix and xarelto-confirmed previously  # chronic pain - says uses home hydrocodone sparingly - cont prn hydrocodone  # phtn - cont home imdur, m/w/f lasix  # hypothyroid - cont home levothyroxine   DVT prophylaxis: Xarelto Code Status: DNR Family Communication: None at bedside Disposition Plan:  Back home Barrier still unable to wean her off of oxygen and not at baseline from respiratory status.  Mildly volume overloaded will need iv diuresis.  Possible d/c in 1-2 days     LOS: 1 day   Time spent: 45 min with >50% on coc    Nolberto Hanlon, MD [facility] Pager [phone]  If 7PM-7AM, please contact night-coverage [email or web address] Password [facility] 08/15/2019, 9:10 AM [name], female   DOB: 1941/05/24, 79 y.o.   MRN: [id number]

## 2019-08-15 NOTE — Progress Notes (Signed)
Pt refuses CPAP and it is not in the room.

## 2019-08-15 NOTE — Plan of Care (Signed)
Vebalizes understanding of current plan of care

## 2019-08-16 ENCOUNTER — Inpatient Hospital Stay (HOSPITAL_COMMUNITY)
Admit: 2019-08-16 | Discharge: 2019-08-16 | Disposition: A | Discharge status: Home / Self Care | Payer: Medicare HMO | Attending: Physician Assistant | Admitting: Physician Assistant

## 2019-08-16 DIAGNOSIS — I361 Nonrheumatic tricuspid (valve) insufficiency: Secondary | ICD-10-CM

## 2019-08-16 DIAGNOSIS — I509 Heart failure, unspecified: Secondary | ICD-10-CM

## 2019-08-16 DIAGNOSIS — I340 Nonrheumatic mitral (valve) insufficiency: Secondary | ICD-10-CM

## 2019-08-16 DIAGNOSIS — I480 Paroxysmal atrial fibrillation: Secondary | ICD-10-CM

## 2019-08-16 DIAGNOSIS — I2510 Atherosclerotic heart disease of native coronary artery without angina pectoris: Secondary | ICD-10-CM

## 2019-08-16 DIAGNOSIS — J441 Chronic obstructive pulmonary disease with (acute) exacerbation: Secondary | ICD-10-CM

## 2019-08-16 DIAGNOSIS — I10 Essential (primary) hypertension: Secondary | ICD-10-CM

## 2019-08-16 LAB — BASIC METABOLIC PANEL
Anion gap: 8 (ref 5–15)
BUN: 27 mg/dL — ABNORMAL HIGH (ref 8–23)
CO2: 29 mmol/L (ref 22–32)
Calcium: 9 mg/dL (ref 8.9–10.3)
Chloride: 106 mmol/L (ref 98–111)
Creatinine, Ser: 0.97 mg/dL (ref 0.44–1.00)
GFR calc Af Amer: >60 mL/min (ref >60)
GFR calc non Af Amer: 55 mL/min — ABNORMAL LOW (ref >60)
Glucose, Bld: 109 mg/dL — ABNORMAL HIGH (ref 70–99)
Potassium: 4 mmol/L (ref 3.5–5.1)
Sodium: 143 mmol/L (ref 135–145)

## 2019-08-16 LAB — BRAIN NATRIURETIC PEPTIDE: B Natriuretic Peptide: 486 pg/mL — ABNORMAL HIGH (ref 0.0–100.0)

## 2019-08-16 MED ORDER — POTASSIUM CHLORIDE CRYS ER 10 MEQ PO TBCR
10.0000 meq | EXTENDED_RELEASE_TABLET | Freq: Two times a day (BID) | ORAL | Status: DC
  Administered 2019-08-16 – 2019-08-17 (×3): 10 meq via ORAL
  Filled 2019-08-16 (×3): qty 1

## 2019-08-16 MED ORDER — FUROSEMIDE 10 MG/ML IJ SOLN
20.0000 mg | Freq: Two times a day (BID) | INTRAMUSCULAR | Status: DC
  Administered 2019-08-16 – 2019-08-17 (×2): 20 mg via INTRAVENOUS
  Filled 2019-08-16 (×2): qty 2

## 2019-08-16 MED ORDER — FUROSEMIDE 10 MG/ML IJ SOLN
20.0000 mg | Freq: Once | INTRAMUSCULAR | Status: AC
  Administered 2019-08-16: 20 mg via INTRAVENOUS
  Filled 2019-08-16: qty 2

## 2019-08-16 NOTE — Consult Note (Signed)
Cardiology Consultation:   [name]; [id number]; 1940-06-07   Admit date: 08/12/2019 Date of Consult: 08/16/2019  Primary Care Provider: Mosie Lukes, MD Primary Cardiologist: Andrea [name] Primary Electrophysiologist: Andrea [name]  Patient Profile:   Andrea [name] is a 79 y.o. female with a hx of CAD s/p CABG in 1999 s/p PCI to the distal RCA in 2013, PCI x 2 in the SVG to RCA and mid LCx in 2017,PAF on Xarelto, SVT s/p failed ablation in 02/2018, PVD s/p lower extremity stenting, carotid artery disease s/p left-sided CEA, pulmonary hypertension, COPD with prior tobacco use, infrarenal AAA, PE, HTN, HLD, asthma, hypothyroidism, spinal stenosis who is being seen today for the evaluation of dyspnea at the request of Andrea [name].  History of Present Illness:   Andrea [name] most recent Andrea [name] from 2018 showed significant underlying 3-vessel CAD with a patent LIMA to LAD. The SVG to RCA was occluded proximally with left-to-right collaterals. The LCx stent was patent with no significant restenosis. LVEF was mild reduced at 50-55% with inferior wall HK. Aortic arch angiogram showed 60% heavily calcified stenosis in the ostium of the left subclavian artery with an approximate 74-08 mmHg systolic gradient across the stenosis. Most recent echo from 2019 showed an EF of 55-60%, mild LVH, no RWMA, mild to moderate MR, mildly dilated left atrium, moderately to severely dilated right atrium, PASP 49 mmHg. She was referred to EP for evaluation of SVT and underwent unsuccessful ablation in 02/2018 (SVT not able to be induced). She was diagnosed with Afib in 10/2018 and has been placed on Xarelto. She is followed by the Afib clinic. She was last seen in the office in 03/2019 noting mostly COPD symptoms.  She was admitted to [facility] on 3/19 with increased exertional dyspnea and felt to have an AECOPD.  Admission EKG showed rate controlled A. fib.  She has been treated with azithromycin, albuterol nebs, and  prednisone. With steroids, she was felt to have some degree of volume overload on 3/22 and given IV Lasix. CXR showed no acute abnormality with unchanged cardiomegaly. BNP today has improved to 486 from prior of 621 two days ago. Ins and outs likely inaccurate. No recent weights. Earlier today, she got up to use the restroom and was noted to be hypoxic on room air with saturations in the 80s% requiring 2 L via nasal cannula. She was tachycardic into the 140s bpm. She was not on telemetry at the time of the above. Upon placing the patient on telemetry, she was noted to be in Afib with ventricular rates mostly in the 70s to 80s bpm with rare episode in the 1 teens bpm. She has been continued on PTA diltiazem and Xarelto.  At time of cardiology consult, she reports a 1 to 2-week history of increased shortness of breath, cough, wheezing, and exertional fatigue/dyspnea.  She also notes intermittent chest discomfort that is described as a soreness and is randomly occurring but does not feel like her prior episodes of angina.  She notes her current respiratory status feels similar to how she was feeling when she last saw her primary cardiologist in 03/2019 at which time she was felt to have a COPD exacerbation.  Past Medical History:  Diagnosis Date   AAA (abdominal aortic aneurysm) (Andrea [name]) 01/2009   AAA (2.8 x 3.0)  moderate RAS (left); 2.7 x 2.7 cm (07/11/05)   Acute bronchitis 03/20/2013; 2017   Angina    Anosmia    Asthma  Baker's cyst of knee 01/30/2014   Basal cell carcinoma    "back and left arm"   Bradycardia    Metoprolol stopped 08/2011   CAD (coronary artery disease)    s/CABG (reports IMA and 2 SVGs) back in 1999  Myoview normal 3/10; s/p PCI with DES to PL branch of distal RCA 09/2011; PCI +DES to SVG-RCA, PCI + DES to mid LCx 12/2015   Cerebrovascular disease 01/2009   carotid u/s  R 0-39%   L 60-79%   Chronic thoracic back pain    COPD (chronic obstructive pulmonary disease) (HCC)      Depression    Dizziness    Dysrhythmia    hx of sinus brady   GERD (gastroesophageal reflux disease)    Heart murmur    Herniated lumbar disc without myelopathy    History of blood transfusion 1999   "when I had the bypass; had a PE"   Hyperglycemia 10/23/2015   Hyperlipidemia    Hypertension    Hypothyroidism 09/30/2016   Medicare annual wellness visit, subsequent 07/31/2013   NSTEMI (non-ST elevated myocardial infarction) (Andrea [name]) 11/12   Cath showed atretic IMA graft to the LAD, SVG to PD was patent but the continuation of this graft to the PL branch was occluded; there were L to R collaterals; Mid LAD had a 60 to 70% stenosis. She has been treated medically.  Neg Myoview 05/2011   Osteoarthritis of back    Osteoporosis    Pneumonia "several times"   Pulmonary embolism (Andrea [name]) 1999   "after my bypass"   PVD (peripheral vascular disease) (Andrea [name])    Shortness of breath    Squamous carcinoma    "nose"   Thyroid disease    Hypothyroid   Unstable angina (Andrea [name]) 09/25/2015    Past Surgical History:  Procedure Laterality Date   ABDOMINAL AORTIC ANEURYSM REPAIR     pt denies this hx on 01/02/2016   BASAL CELL CARCINOMA EXCISION     CARDIAC CATHETERIZATION N/A 01/02/2016   Procedure: Left Heart Cath and Coronary Angiography;  Surgeon: Wellington Hampshire, MD;  Location: [name];  Service: Cardiovascular;  Laterality: N/A;   CARDIAC CATHETERIZATION  1996; 1999   CARDIAC CATHETERIZATION N/A 06/25/2016   Procedure: Left Heart Cath and Cors/Grafts Angiography;  Surgeon: Wellington Hampshire, MD;  Location: [name];  Service: Cardiovascular;  Laterality: N/A;   CAROTID ENDARTERECTOMY Left 01/02/2011   CATARACT EXTRACTION W/ INTRAOCULAR LENS  IMPLANT, BILATERAL     CLOSED REDUCTION NASAL FRACTURE  11/2007   CORONARY ANGIOPLASTY WITH STENT PLACEMENT  10/10/2011   drug eluting  to rc & saphenous   CORONARY ARTERY BYPASS GRAFT  1999   "CABG X3"   DILATION  AND CURETTAGE OF UTERUS     FEMORAL ARTERY STENT Bilateral    FRACTURE SURGERY     LEFT HEART CATHETERIZATION WITH CORONARY/GRAFT ANGIOGRAM N/A 04/22/2011   Procedure: LEFT HEART CATHETERIZATION WITH Beatrix Fetters;  Surgeon: Jolaine Artist, MD;  Location: [facility] CATH LAB;  Service: Cardiovascular;  Laterality: N/A;   LEFT HEART CATHETERIZATION WITH CORONARY/GRAFT ANGIOGRAM N/A 10/10/2011   Procedure: LEFT HEART CATHETERIZATION WITH Beatrix Fetters;  Surgeon: Peter M Martinique, MD;  Location: [facility] CATH LAB;  Service: Cardiovascular;  Laterality: N/A;   NASAL SINUS SURGERY     twice   SQUAMOUS CELL CARCINOMA EXCISION     SVT ABLATION N/A 03/19/2018   Procedure: SVT ABLATION;  Surgeon: Constance Haw, MD;  Location: [facility]  INVASIVE CV LAB;  Service: Cardiovascular;  Laterality: N/A;   TUBAL LIGATION       Home Meds: Prior to Admission medications   Medication Sig Start Date End Date Taking? Authorizing Provider  acetaminophen (TYLENOL) 650 MG CR tablet Take 1,300 mg by mouth 2 (two) times daily.   Yes [provider]  albuterol (PROVENTIL) (2.5 MG/3ML) 0.083% nebulizer solution Take 3 mLs (2.5 mg total) by nebulization every 4 (four) hours as needed for wheezing or shortness of breath. Dx:J44.9 03/25/19  Yes Kasa, Maretta Bees, MD  albuterol (VENTOLIN HFA) 108 (90 Base) MCG/ACT inhaler INHALE 2 PUFFS INTO THE LUNGS EVERY 6 (SIX) HOURS AS NEEDED FOR WHEEZING. Patient taking differently: Inhale 2 puffs into the lungs every 6 (six) hours as needed for wheezing or shortness of breath.  06/13/19  Yes Andrea Lukes, MD  amLODipine (NORVASC) 5 MG tablet TAKE 1 TABLET BY MOUTH ONCE DAILY Patient taking differently: Take 5 mg by mouth daily.  05/31/19  Yes Minna Merritts, MD  azelastine (ASTELIN) 0.1 % nasal spray Place 2 sprays into both nostrils at bedtime as needed for rhinitis. 06/28/18  Yes Andrea Lukes, MD  Biotin 5 MG TABS Take 5 mg by mouth daily.    Yes [provider]  Calcium Carbonate-Vitamin D (CALCIUM + D PO) Take 1 tablet by mouth daily.    Yes [provider]  Cholecalciferol (VITAMIN D3) 2000 units TABS Take 2,000 Units by mouth daily.    Yes [provider]  clopidogrel (PLAVIX) 75 MG tablet TAKE 1 TABLET BY MOUTH ONCE A DAY Patient taking differently: Take 75 mg by mouth daily.  07/19/19  Yes Minna Merritts, MD  diltiazem (CARDIZEM CD) 240 MG 24 hr capsule Take 1 capsule (240 mg total) by mouth daily. Pt must keep appt in May to continue receiving refills 07/28/19  Yes Camnitz, Ocie Doyne, MD  diltiazem (CARDIZEM) 30 MG tablet Take one tablet by mouth every 4 hours AS NEEDED for A-fib HR > 100 as long as BP > 100. Patient taking differently: Take 30 mg by mouth every 4 (four) hours as needed (aFib [HR >100 and if BP >100]).  11/11/18  Yes Fenton, Clint R, PA  ezetimibe (ZETIA) 10 MG tablet TAKE 1 TABLET BY MOUTH DAILY Patient taking differently: Take 10 mg by mouth at bedtime.  07/25/19  Yes Minna Merritts, MD  furosemide (LASIX) 20 MG tablet Take 20 mg by mouth every Monday, Wednesday, and Friday.    Yes [provider]  HYDROcodone-acetaminophen (NORCO) 10-325 MG tablet Take 1 tablet by mouth every 6 (six) hours as needed for severe pain.   Yes [provider]  isosorbide mononitrate (IMDUR) 60 MG 24 hr tablet TAKE 1 TABLET BY MOUTH TWICE A DAY Patient taking differently: Take 60 mg by mouth 2 (two) times daily.  05/31/19  Yes Gollan, Kathlene November, MD  Krill Oil 500 MG CAPS Take 500 mg by mouth daily.    Yes [provider]  levothyroxine (SYNTHROID) 50 MCG tablet TAKE 1 TABLET BY MOUTH ONCE DAILY Patient taking differently: Take 50 mcg by mouth daily before breakfast.  07/25/19  Yes Andrea Lukes, MD  nitroGLYCERIN (NITROSTAT) 0.4 MG SL tablet PLACE 1 TABLET (0.4 MG TOTAL) UNDER THE TONGUE EVERY 5 (FIVE) MINUTES AS NEEDED FOR CHEST PAIN. 02/01/18  Yes Gollan, Kathlene November, MD  omeprazole (PRILOSEC)  20 MG capsule TAKE 1 CAPSULE BY MOUTH ONCE DAILY Patient taking differently: Take  20 mg by mouth daily.  01/26/19  Yes Andrea Lukes, MD  Polyethyl Glycol-Propyl Glycol (SYSTANE OP) Place 1 drop into both eyes 2 (two) times a day.    Yes [provider]  rosuvastatin (CRESTOR) 40 MG tablet TAKE 1 TABLET BY MOUTH ONCE A DAY Patient taking differently: Take 40 mg by mouth at bedtime.  09/01/18  Yes Minna Merritts, MD  sertraline (ZOLOFT) 100 MG tablet Take 1 tablet (100 mg total) by mouth daily. 06/13/19  Yes Andrea Lukes, MD  STIOLTO RESPIMAT 2.5-2.5 MCG/ACT AERS INHALE 2 PUFFS INTO THE LUNGS ONCE DAILY Patient taking differently: Inhale 2 puffs into the lungs daily.  05/02/19  Yes Kasa, Maretta Bees, MD  Vitamin D, Ergocalciferol, (DRISDOL) 1.25 MG (50000 UT) CAPS capsule TAKE 1 CAPSULE BY MOUTH ONCE A WEEK 11/24/18  Yes Andrea Lukes, MD  XARELTO 15 MG TABS tablet TAKE 1 TABLET BY MOUTH ONCE A DAY WITH SUPPER Patient taking differently: Take 15 mg by mouth at bedtime.  05/31/19  Yes Fenton, Clint R, PA  zinc gluconate 50 MG tablet Take 50 mg by mouth daily.   Yes [provider]    Inpatient Medications: Scheduled Meds:  clopidogrel  75 mg Oral Daily   dextromethorphan-guaiFENesin  1 tablet Oral BID   diltiazem  240 mg Oral Daily   ezetimibe  10 mg Oral Daily   furosemide  20 mg Oral Daily   ipratropium-albuterol  3 mL Nebulization Q6H   isosorbide mononitrate  60 mg Oral BID   levothyroxine  50 mcg Oral Daily   pantoprazole  40 mg Oral Daily   predniSONE  20 mg Oral Q breakfast   Rivaroxaban  15 mg Oral Q supper   rosuvastatin  40 mg Oral Daily   sertraline  100 mg Oral Daily   sodium chloride flush  3 mL Intravenous Q12H   Continuous Infusions:  sodium chloride     PRN Meds: sodium chloride, albuterol, chlorpheniramine-HYDROcodone, HYDROcodone-acetaminophen, sodium chloride flush  Allergies:   Allergies  Allergen Reactions   Erythromycin Other  (See Comments)    Tongue burns   Meperidine Nausea And Vomiting   Shellfish Allergy Nausea And Vomiting   Ciprofloxacin Rash and Other (See Comments)    Rash from IV   Codeine Nausea And Vomiting   Penicillins Rash and Other (See Comments)    Has patient had a PCN reaction causing immediate rash, facial/tongue/throat swelling, SOB or lightheadedness with hypotension:unsure Has patient had a PCN reaction causing severe rash involving mucus membranes or skin necrosis:No Has patient had a PCN reaction that required hospitalization:No Has patient had a PCN reaction occurring within the last 10 years:No If all of the above answers are "NO", then may proceed with Cephalosporin use.    Tape Rash    pls use paper tape    Social History:   Social History   Socioeconomic History   Marital status: Married    Spouse name: Not on file   Number of children: 5   Years of education: Not on file   Highest education level: Not on file  Occupational History   Occupation: Retired    Fish farm manager: RETIRED    Comment: former  Marine scientist  Tobacco Use   Smoking status: Former Smoker    Packs/day: 0.50    Years: 40.00    Pack years: 20.00    Types: Cigarettes    Quit date: 01/19/1993    Years since quitting: 26.5   Smokeless tobacco:  Never Used  Substance and Sexual Activity   Alcohol use: Yes    Comment: 01/02/2016 "might drink a glass of wine socially q 3-4 months"   Drug use: No   Sexual activity: Not Currently    Birth control/protection: Post-menopausal  Other Topics Concern   Not on file  Social History Narrative   Married with 5 children   Social Determinants of Health   Financial Resource Strain:    Difficulty of Paying Living Expenses:   Food Insecurity:    Worried About Charity fundraiser in the Last Year:    Arboriculturist in the Last Year:   Transportation Needs:    Film/video editor (Medical):    Lack of Transportation (Non-Medical):   Physical  Activity:    Days of Exercise per Week:    Minutes of Exercise per Session:   Stress:    Feeling of Stress :   Social Connections:    Frequency of Communication with Friends and Family:    Frequency of Social Gatherings with Friends and Family:    Attends Religious Services:    Active Member of Clubs or Organizations:    Attends Music therapist:    Marital Status:   Intimate Partner Violence:    Fear of Current or Ex-Partner:    Emotionally Abused:    Physically Abused:    Sexually Abused:      Family History:   Family History  Problem Relation Age of Onset   Heart attack Mother 71   Diabetes Mother    Colon cancer Father 61   Lung cancer Father        smoked   Heart disease Father    Hypertension Father    Angina Father    Lung cancer Sister        smoked   Hypothyroidism Sister    Hypertension Brother    Heart attack Brother    Heart disease Brother        PTCA with Stent   Heart attack Brother        CABG   Heart disease Brother        CABG with 1 bypass   Aneurysm Brother        brain   Alcohol abuse Brother    Hyperlipidemia Daughter    Diabetes Maternal Grandmother    Stroke Maternal Grandmother    Cirrhosis Maternal Grandfather    Stroke Paternal Grandmother    Obesity Daughter    Obesity Son     ROS:  Review of Systems  Constitutional: Positive for malaise/fatigue. Negative for chills, diaphoresis, fever and weight loss.  HENT: Negative for congestion.   Eyes: Negative for discharge and redness.  Respiratory: Positive for cough, shortness of breath and wheezing. Negative for sputum production.   Cardiovascular: Positive for chest pain and palpitations. Negative for orthopnea, claudication, leg swelling and PND.  Gastrointestinal: Negative for abdominal pain, heartburn, nausea and vomiting.  Musculoskeletal: Negative for falls and myalgias.  Skin: Negative for rash.  Neurological: Positive for  weakness. Negative for dizziness, tingling, tremors, sensory change, speech change, focal weakness and loss of consciousness.  Endo/Heme/Allergies: Does not bruise/bleed easily.  Psychiatric/Behavioral: Negative for substance abuse. The patient is not nervous/anxious.   All other systems reviewed and are negative.     Physical Exam/Data:   Vitals:   08/15/19 2336 08/16/19 0140 08/16/19 0739 08/16/19 0833  BP: 124/78  135/71   Pulse: 72  68   Resp:  17  19   Temp: 98.3 F (36.8 C)  97.7 F (36.5 C)   TempSrc: Oral     SpO2: 94% 95% 93% 96%  Weight:      Height:        Intake/Output Summary (Last 24 hours) at 08/16/2019 1227 Last data filed at 08/16/2019 1042 Gross per 24 hour  Intake 240 ml  Output --  Net 240 ml   Filed Weights   08/12/19 1236  Weight: 68 kg   Body mass index is 27.44 kg/m.   Physical Exam: General: Well developed, well nourished, in no acute distress. Head: Normocephalic, atraumatic, sclera non-icteric, no xanthomas, nares without discharge.  Neck: Negative for carotid bruits. JVD mildly elevated. Lungs: Diminished breath sounds bilaterally with wheezing and rhonchi throughout. Breathing is unlabored. Heart: Irregularly irregular with S1 S2. II/VI systolic murmur at the apex, no rubs, or gallops appreciated. Abdomen: Soft, non-tender, non-distended with normoactive bowel sounds. No hepatomegaly. No rebound/guarding. No obvious abdominal masses. Msk:  Strength and tone appear normal for age. Extremities: No clubbing or cyanosis. No edema. Distal pedal pulses are 2+ and equal bilaterally. Neuro: Alert and oriented X 3. No facial asymmetry. No focal deficit. Moves all extremities spontaneously. Psych:  Responds to questions appropriately with a normal affect.   EKG:  The EKG was personally reviewed and demonstrates: A. fib, 92 bpm, nonspecific inferolateral ST-T changes Telemetry:  Telemetry was personally reviewed and demonstrates: A. fib with  ventricular rates mostly in the 70s to 80s bpm with rare episode in the 1 teens bpm  Weights: Filed Weights   08/12/19 1236  Weight: 68 kg    Relevant CV Studies:  Holter monitor 12/2017: Normal sinus rhythm Rare episodes of SVT/atrial tachycardia, 130 runs longest run 14 beats with rate 138 bpm Frequent PACs, 7%  Rare PVCs, 1% Rare PVCs and bigemeny __________  2D echo 11/2017: - Left ventricle: The cavity size was normal. Wall thickness was  increased in a pattern of mild LVH. Systolic function was normal.  The estimated ejection fraction was in the range of 55% to 60%.  Wall motion was normal; there were no regional wall motion  abnormalities.  - Aortic valve: Valve area (VTI): 1.43 cm^2. Valve area (Vmax):  1.23 cm^2. Valve area (Vmean): 1.24 cm^2.  - Mitral valve: There was mild to moderate regurgitation.  - Left atrium: The atrium was mildly dilated.  - Right atrium: The atrium was moderately to severely dilated.  - Tricuspid valve: There was moderate regurgitation.  - Pulmonary arteries: PA peak pressure: 49 mm Hg (S).   Impressions:   - Normal LVEF.  MIld LAE.  Moderate to severe RA enlargement.  Mild to moderate MR.  Moderate TR.  MIld pulmonary HTN.  ___________  LHC 05/2016:  Ost RCA lesion, 100 %stenosed.  Ost 2nd Mrg to 2nd Mrg lesion, 40 %stenosed.  Mid Cx lesion, 0 %stenosed.  Prox LAD to Dist LAD lesion, 65 %stenosed.  LIMA and is anatomically normal.  The flow in the graft is reversed.  SVG.  Mid Graft lesion, 0 %stenosed.  Dist Graft-2 lesion, 0 %stenosed.  Dist Graft-1 lesion, 0 %stenosed.  Ost RPDA to RPDA lesion, 10 %stenosed.  Ost LM to LM lesion, 20 %stenosed.  Origin lesion, 100 %stenosed.  There is mild left ventricular systolic dysfunction.  LV end diastolic pressure is mildly elevated.  The left ventricular ejection fraction is 50-55% by visual estimate.  There is no mitral valve  regurgitation.   1.  Significant underlying three-vessel coronary artery disease with patent LIMA to LAD. The SVG to the RCA is now occluded proximally with left-to-right collaterals. The left circumflex stent is patent with no significant restenosis. 2. Mildly reduced LV systolic function with an EF of 50-55% and inferior wall hypokinesis. Mildly elevated left ventricular end-diastolic pressure. 3. Aortic arch angiogram was done to evaluate the stenosis in the left subclavian artery. It showed 60% heavily calcified stenosis in the ostium of the left subclavian artery. There was about 96-29 mm systolic gradient across the stenosis.  Recommendations: The culprit for angina is the occluded SVG to RCA which is new. She has left-to-right collaterals and I recommend maximizing antianginal therapy. The native RCA is occluded at the ostium and is not a good target for PCI. The patient might require left subclavian artery stent placement to protect LIMA flow to the LAD. If stent placement to the left subclavian is to be done, it would probably be best done from the left arm due to type III arch.    Laboratory Data:  Chemistry Recent Labs  Lab 08/14/19 0751 08/15/19 0559 08/16/19 0616  NA 140 140 143  K 4.3 3.9 4.0  CL 102 104 106  CO2 28 28 29   GLUCOSE 100* 105* 109*  BUN 28* 33* 27*  CREATININE 1.02* 0.75 0.97  CALCIUM 9.2 8.8* 9.0  GFRNONAA 52* >60 55*  GFRAA >60 >60 >60  ANIONGAP 10 8 8     No results for input(s): PROT, ALBUMIN, AST, ALT, ALKPHOS, BILITOT in the last 168 hours. Hematology Recent Labs  Lab 08/12/19 1238 08/15/19 0559  WBC 6.0 8.9  RBC 4.74 4.18  HGB 12.0 10.7*  HCT 38.8 34.9*  MCV 81.9 83.5  MCH 25.3* 25.6*  MCHC 30.9 30.7  RDW 14.8 14.9  PLT 160 179   Cardiac EnzymesNo results for input(s): TROPONINI in the last 168 hours. No results for input(s): TROPIPOC in the last 168 hours.  BNP Recent Labs  Lab 08/12/19 1238 08/14/19 0751 08/16/19 0616  BNP  337.0* 621.0* 486.0*    DDimer No results for input(s): DDIMER in the last 168 hours.  Radiology/Studies:  DG Chest 2 View  Result Date: 08/12/2019 IMPRESSION: Enlargement of cardiac silhouette post median sternotomy. COPD changes with biapical scarring and chronic interstitial prominence. No acute abnormalities. Aortic Atherosclerosis (ICD10-I70.0) and Emphysema (ICD10-J43.9). Electronically Signed   By: Lavonia Dana M.D.   On: 08/12/2019 13:12   DG Chest Port 1 View  Result Date: 08/15/2019 IMPRESSION: No acute abnormality.  Unchanged cardiomegaly. Electronically Signed   By: Lorriane Shire M.D.   On: 08/15/2019 10:31    Assessment and Plan:   1. Acute hypoxic respiratory failure: -Likely multifactorial including AECOPD with pulmonary hypertension and some degree of HFpEF with steroid use -Wean oxygen as able  2. PAF with RVR: -A. fib is of uncertain duration as she was in sinus rhythm during her last office visit with [place] and was documented to be in A. fib with controlled ventricular response at time of admission -Ventricular rates overall have been reasonably controlled in the 70s to 80s bpm with rare episode in the 1 teens bpm on telemetry -Unfortunately, she thinks that she may have missed 1 dose of Xarelto last week -Given this, and in the setting of her respiratory issues we recommend continuing rate control with diltiazem -Recommend tapering steroids as soon as possible -Consider changing albuterol to Xopenex, deferred to IM -Prior to discharge, she will need to ambulate to assess  for appropriately controlled ventricular rates and for symptoms.  Should she demonstrate poorly controlled ventricular rates or if she continues to be symptomatic with ambulation she will require TEE guided cardioversion prior to discharge.  Otherwise, we will plan to consider cardioversion as an outpatient after she has been adequately anticoagulated and after her acute illness has improved -CHADS2VASc  at least 6 (CHF, HTN, age x 2, vascular disease, gender) -Renally dosed Xarelto 15 mg daily -Estimated Creatinine Clearance: 41.8 mL/min (by C-G formula based on SCr of 0.97 mg/dL).   3. HFpEF/pulmonary hypertension: -Some degree of volume overload likely in the setting of steroids, which should be stopped as soon as safely possible -Change p.o. Lasix 20 mg to IV Lasix 20 mg twice daily with KCl repletion -Check echo -Recommend daily weights and strict I's and O's  4. CAD involving the native arteries and bypass grafts without angina: -She notes intermittent chest discomfort that is not related to any particular activity or exertion -High-sensitivity troponin negative x2 this admission -Plavix  -Check echo as above -Crestor and Zetia  5. SVT: -No evidence of recurrence at this time -Continue management as above given underlying A. Fib -Follow-up with EP as directed as an outpatient  6. PVD/carotid artery disease: -She is due for follow up outpatient imaging with primary cardiologist  -Plavix -Crestor  -Zetia  7. HTN: -Blood pressure well controlled -Continue current therapy   8. HLD: -Direct LDL 61 from 05/2019 -Crestor and Zetia    For questions or updates, please contact [place] Please consult [email or web address] for contact info under Cardiology/STEMI.   Signed, Christell Faith, PA-C [place] Pager: [phone] 08/16/2019, 12:27 PM

## 2019-08-16 NOTE — Plan of Care (Signed)
  Problem: Activity: Goal: Interest or engagement in activities will improve Outcome: Progressing Goal: Sleeping patterns will improve Outcome: Progressing

## 2019-08-16 NOTE — Progress Notes (Signed)
PROGRESS NOTE    Kizzi Overbey  WER:[id number] DOB: [birth year] DOA: 08/12/2019 PCP: Mosie Lukes, MD    Brief Narrative:  Neliah Cuyler is a 79 y.o. female with medical history significant for copd, CAD s/p cag 1999 and cardiac stents 2013 and 2017, AAA, carotid artery stenosis, major depressive disorder, htn, hypothyroid, a-fib, pulmonary hypertension, who presents with sob    Consultants:   None  Procedures: None  Antimicrobials:   Azithromycin   Subjective: This am , Pt up to use bathroom, upon returning to bed Portable pulse ox applied. 02 sats at 84% on room air, HR in the 140's and irregular , improved to 95% with 2L per nsg. Back on tele, found with afib.  Objective: Vitals:   08/15/19 2336 08/16/19 0140 08/16/19 0739 08/16/19 0833  BP: 124/78  135/71   Pulse: 72  68   Resp: 17  19   Temp: 98.3 F (36.8 C)  97.7 F (36.5 C)   TempSrc: Oral     SpO2: 94% 95% 93% 96%  Weight:      Height:        Intake/Output Summary (Last 24 hours) at  08/16/2019 1349 Last data filed at 08/16/2019 1042 Gross per 24 hour  Intake 240 ml  Output --  Net 240 ml   Filed Weights   08/12/19 1236  Weight: 68 kg    Examination:  General exam: Appears calm and comfortable now lying in bed.  Respiratory system:cta with no wheezing. Cardiovascular system: S1 & S2 heard, RRR.+JVD No murmurs, rubs, gallops or clicks.  Gastrointestinal system: Abdomen is nondistended, soft and nontender.  Normal bowel sounds heard. Central nervous system: Alert and oriented.grossly intact. Extremities: mild edema b/l Skin:warn Psychiatry: Judgement and insight appear normal. Mood & affect appropriate.     Data Reviewed: I have personally reviewed following labs and imaging studies  CBC: Recent Labs  Lab 08/12/19 1238 08/15/19 0559  WBC 6.0 8.9  HGB 12.0 10.7*  HCT 38.8 34.9*  MCV 81.9 83.5  PLT 160 938   Basic Metabolic Panel: Recent Labs  Lab 08/12/19 1238 08/14/19 0751 08/15/19 0559 08/16/19 0616  NA 142 140 140 143  K 3.9 4.3 3.9 4.0  CL 106 102 104 106  CO2 25 28 28 29   GLUCOSE 97 100* 105* 109*  BUN 15 28* 33* 27*  CREATININE 0.93 1.02* 0.75 0.97  CALCIUM 9.2 9.2 8.8* 9.0   GFR: Estimated Creatinine Clearance: 41.8 mL/min (by C-G formula based on SCr of 0.97 mg/dL). Liver Function Tests: No results for input(s): AST, ALT, ALKPHOS, BILITOT, PROT, ALBUMIN in the last 168 hours. No results for input(s): LIPASE, AMYLASE in the last 168 hours. No results for input(s): AMMONIA in the last 168 hours. Coagulation Profile: No results for input(s): INR, PROTIME in the last 168 hours. Cardiac Enzymes: No results for input(s): CKTOTAL, CKMB, CKMBINDEX, TROPONINI in the last 168 hours. BNP (last 3 results) Recent Labs    03/22/19 1131  PROBNP 567   HbA1C: No results for input(s): HGBA1C in the last 72 hours. CBG: No results for input(s): GLUCAP in the last 168 hours. Lipid Profile: No results for input(s): CHOL, HDL, LDLCALC, TRIG,  CHOLHDL, LDLDIRECT in the last 72 hours. Thyroid Function Tests: No results for input(s): TSH, T4TOTAL, FREET4, T3FREE, THYROIDAB in the last 72 hours. Anemia Panel: No results for input(s): VITAMINB12, FOLATE, FERRITIN, TIBC, IRON, RETICCTPCT in the last 72 hours. Sepsis Labs: No results for input(s): PROCALCITON, LATICACIDVEN in the last 168 hours.  Recent Results (from the past 240 hour(s))  SARS CORONAVIRUS 2 (TAT 6-24 HRS) Nasopharyngeal Nasopharyngeal Swab     Status: None   Collection Time: 08/12/19  8:25 PM   Specimen: Nasopharyngeal Swab  Result Value Ref Range Status   SARS Coronavirus 2 NEGATIVE NEGATIVE Final    Comment: (NOTE) SARS-CoV-2 target nucleic acids are NOT DETECTED. The SARS-CoV-2 RNA is generally detectable in upper and lower respiratory specimens during the acute phase of infection. Negative results do not preclude SARS-CoV-2 infection, do not rule out co-infections with other pathogens, and should not be used as the sole basis for treatment or other patient management decisions. Negative results must be combined with clinical observations, patient history,  and epidemiological information. The expected result is Negative. Fact Sheet for Patients: SugarRoll.be Fact Sheet for Healthcare Providers: https://www.woods-mathews.com/ This test is not yet approved or cleared by the [place] FDA and  has been authorized for detection and/or diagnosis of SARS-CoV-2 by FDA under an Emergency Use Authorization (EUA). This EUA will remain  in effect (meaning this test can be used) for the duration of the COVID-19 declaration under Section 56 4(b)(1) of the Act, 21 U.S.C. section 360bbb-3(b)(1), unless the authorization is terminated or revoked sooner. Performed at [facility], [place] [address] [place], [place] [address]          Radiology Studies: DG Chest Port 1 View  Result Date: 08/15/2019 CLINICAL DATA:   Shortness of breath. EXAM: PORTABLE CHEST 1 VIEW COMPARISON:  08/12/2019 FINDINGS: Persistent cardiomegaly. Pulmonary vascularity is normal. Lungs are clear. CABG. Coronary artery stent in place. Aortic atherosclerosis. No acute bone abnormality. IMPRESSION: No acute abnormality.  Unchanged cardiomegaly. Electronically Signed   By: Lorriane Shire M.D.   On: 08/15/2019 10:31        Scheduled Meds: . clopidogrel  75 mg Oral Daily  . dextromethorphan-guaiFENesin  1 tablet Oral BID  . diltiazem  240 mg Oral Daily  . ezetimibe  10 mg Oral Daily  . furosemide  20 mg Oral Daily  . ipratropium-albuterol  3 mL Nebulization Q6H  . isosorbide mononitrate  60 mg Oral BID  . levothyroxine  50 mcg Oral Daily  . pantoprazole  40 mg Oral Daily  . predniSONE  20 mg Oral Q breakfast  . Rivaroxaban  15 mg Oral Q supper  . rosuvastatin  40 mg Oral Daily  . sertraline  100 mg Oral Daily  . sodium chloride flush  3 mL Intravenous Q12H   Continuous Infusions: . sodium chloride      Assessment & Plan:   Principal Problem:   COPD with acute exacerbation (HCC) Active Problems:   Depression, recurrent (HCC)   HTN (hypertension)   Coronary artery disease involving coronary bypass graft of native heart with angina pectoris (HCC)   PERIPHERAL VASCULAR DISEASE   Hypoxia   Atrial fibrillation, chronic (HCC)   OSA (obstructive sleep apnea)   COPD (chronic obstructive pulmonary disease) ([place])   # COPD with acute exacerbation with hypoxia On azithro and prednisone taper.  O2 saturation with exertion is 83% on room air  Covid negative Still not at baseline - continue prednisone taper x2 days left - continue azithromycin  - duonebs q6, albuterol q2 prn - hold home stiolto while on duonebs -repeat cxr Mildly volume overloaded still , will give lasix 20iv x1. Monitor renal function Ck am bnp Will need home oxygen.  # OSA - cont qhs cpap  # MDD - cont home sertraline  # a-fib - here in a  fib, with rvr this am - cont home diltiazem, xarelto Cards consulted per pt request  # htn - here bp wnl - cont home dilt, hold home amlodipine given concurrent dilt  # CAD - cont home zetia and plavix and rosuvastatin - per cardiology has her taking both plavix and xarelto-confirmed previously  # chronic pain - says uses home hydrocodone sparingly - cont prn hydrocodone  # phtn - cont home imdur, m/w/f lasix  # hypothyroid - cont home levothyroxine   DVT prophylaxis: Xarelto Code Status: DNR Family Communication: None at bedside Disposition Plan: Back home Barrier:still unable to wean her off of oxygen and not at baseline from  respiratory status.  Mildly volume overloaded will need iv diuresis.  Possible d/c in 1-2 days. Went into afib rvr this am. Needs home o2 on discharge. 2L     LOS: 2 days   Time spent: 45 min with >50% on coc    Nolberto Hanlon, MD [facility] Pager [phone]  If 7PM-7AM, please contact night-coverage [email or web address] Password [facility] 08/16/2019, 1:49 PM Patient ID: Esabella Stockinger, female   DOB: 03-Sep-1940, 79 y.o.   MRN: [id number] Patient ID: Patti Shorb, female   DOB: 07/03/1940, 79 y.o.   MRN: [id number]

## 2019-08-16 NOTE — Progress Notes (Signed)
Pt up to use bathroom. Upon returning to bed. She is visibly short of breath with audible expiratory wheezes present. Portable pulse ox applied. 02 sats at 84% on room air, HR in the 140's and irregular to auscultation. Provider arrived to bedside and order received for telemetry. Monitor applied and current rhythm is a fib. Oxygen applied initially at 4 liters to get sats back up above 90% then decreased to 2 Liters via nasal cannula. sats are at 95%

## 2019-08-16 NOTE — Plan of Care (Signed)
Pt is in agreement with current plan of care.

## 2019-08-17 DIAGNOSIS — R0600 Dyspnea, unspecified: Secondary | ICD-10-CM

## 2019-08-17 DIAGNOSIS — I5032 Chronic diastolic (congestive) heart failure: Secondary | ICD-10-CM | POA: Diagnosis present

## 2019-08-17 DIAGNOSIS — J9601 Acute respiratory failure with hypoxia: Secondary | ICD-10-CM

## 2019-08-17 DIAGNOSIS — I2781 Cor pulmonale (chronic): Secondary | ICD-10-CM | POA: Diagnosis present

## 2019-08-17 DIAGNOSIS — I482 Chronic atrial fibrillation, unspecified: Secondary | ICD-10-CM

## 2019-08-17 DIAGNOSIS — I5033 Acute on chronic diastolic (congestive) heart failure: Secondary | ICD-10-CM | POA: Diagnosis present

## 2019-08-17 LAB — ECHOCARDIOGRAM COMPLETE
Height: 62 in
Weight: 2400 oz

## 2019-08-17 LAB — BASIC METABOLIC PANEL
Anion gap: 10 (ref 5–15)
BUN: 27 mg/dL — ABNORMAL HIGH (ref 8–23)
CO2: 31 mmol/L (ref 22–32)
Calcium: 9.2 mg/dL (ref 8.9–10.3)
Chloride: 102 mmol/L (ref 98–111)
Creatinine, Ser: 0.97 mg/dL (ref 0.44–1.00)
GFR calc Af Amer: >60 mL/min (ref >60)
GFR calc non Af Amer: 55 mL/min — ABNORMAL LOW (ref >60)
Glucose, Bld: 110 mg/dL — ABNORMAL HIGH (ref 70–99)
Potassium: 4 mmol/L (ref 3.5–5.1)
Sodium: 143 mmol/L (ref 135–145)

## 2019-08-17 MED ORDER — FUROSEMIDE 20 MG PO TABS: 20.0000 mg | ORAL_TABLET | Freq: Every day | ORAL | 0 refills | Status: DC

## 2019-08-17 MED ORDER — IPRATROPIUM-ALBUTEROL 0.5-2.5 (3) MG/3ML IN SOLN
3.0000 mL | Freq: Three times a day (TID) | RESPIRATORY_TRACT | Status: DC
  Administered 2019-08-17 (×2): 3 mL via RESPIRATORY_TRACT
  Filled 2019-08-17 (×2): qty 3

## 2019-08-17 MED ORDER — DM-GUAIFENESIN ER 30-600 MG PO TB12: 1.0000 | ORAL_TABLET | Freq: Two times a day (BID) | ORAL | 0 refills | Status: DC

## 2019-08-17 MED ORDER — PREDNISONE 20 MG PO TABS: 20.0000 mg | ORAL_TABLET | Freq: Every day | ORAL | 0 refills | Status: DC

## 2019-08-17 NOTE — Discharge Instructions (Signed)
Chronic Obstructive Pulmonary Disease Chronic obstructive pulmonary disease (COPD) is a long-term (chronic) lung problem. When you have COPD, it is hard for air to get in and out of your lungs. Usually the condition gets worse over time, and your lungs will never return to normal. There are things you can do to keep yourself as healthy as possible.  Your doctor may treat your condition with: ? Medicines. ? Oxygen. ? Lung surgery.  Your doctor may also recommend: ? Rehabilitation. This includes steps to make your body work better. It may involve a team of specialists. ? Quitting smoking, if you smoke. ? Exercise and changes to your diet. ? Comfort measures (palliative care). Follow these instructions at home: Medicines  Take over-the-counter and prescription medicines only as told by your doctor.  Talk to your doctor before taking any cough or allergy medicines. You may need to avoid medicines that cause your lungs to be dry. Lifestyle  If you smoke, stop. Smoking makes the problem worse. If you need help quitting, ask your doctor.  Avoid being around things that make your breathing worse. This may include smoke, chemicals, and fumes.  Stay active, but remember to rest as well.  Learn and use tips on how to relax.  Make sure you get enough sleep. Most adults need at least 7 hours of sleep every night.  Eat healthy foods. Eat smaller meals more often. Rest before meals. Controlled breathing Learn and use tips on how to control your breathing as told by your doctor. Try:  Breathing in (inhaling) through your nose for 1 second. Then, pucker your lips and breath out (exhale) through your lips for 2 seconds.  Putting one hand on your belly (abdomen). Breathe in slowly through your nose for 1 second. Your hand on your belly should move out. Pucker your lips and breathe out slowly through your lips. Your hand on your belly should move in as you breathe out.  Controlled coughing Learn  and use controlled coughing to clear mucus from your lungs. Follow these steps: 1. Lean your head a little forward. 2. Breathe in deeply. 3. Try to hold your breath for 3 seconds. 4. Keep your mouth slightly open while coughing 2 times. 5. Spit any mucus out into a tissue. 6. Rest and do the steps again 1 or 2 times as needed. General instructions  Make sure you get all the shots (vaccines) that your doctor recommends. Ask your doctor about a flu shot and a pneumonia shot.  Use oxygen therapy and pulmonary rehabilitation if told by your doctor. If you need home oxygen therapy, ask your doctor if you should buy a tool to measure your oxygen level (oximeter).  Make a COPD action plan with your doctor. This helps you to know what to do if you feel worse than usual.  Manage any other conditions you have as told by your doctor.  Avoid going outside when it is very hot, cold, or humid.  Avoid people who have a sickness you can catch (contagious).  Keep all follow-up visits as told by your doctor. This is important. Contact a doctor if:  You cough up more mucus than usual.  There is a change in the color or thickness of the mucus.  It is harder to breathe than usual.  Your breathing is faster than usual.  You have trouble sleeping.  You need to use your medicines more often than usual.  You have trouble doing your normal activities such as getting dressed   or walking around the house. Get help right away if:  You have shortness of breath while resting.  You have shortness of breath that stops you from: ? Being able to talk. ? Doing normal activities.  Your chest hurts for longer than 5 minutes.  Your skin color is more blue than usual.  Your pulse oximeter shows that you have low oxygen for longer than 5 minutes.  You have a fever.  You feel too tired to breathe normally. Summary  Chronic obstructive pulmonary disease (COPD) is a long-term lung problem.  The way your  lungs work will never return to normal. Usually the condition gets worse over time. There are things you can do to keep yourself as healthy as possible.  Take over-the-counter and prescription medicines only as told by your doctor.  If you smoke, stop. Smoking makes the problem worse. This information is not intended to replace advice given to you by your health care provider. Make sure you discuss any questions you have with your health care provider. Document Revised: 04/24/2017 Document Reviewed: 06/16/2016 Elsevier Patient Education  2020 [employer]  

## 2019-08-17 NOTE — Discharge Summary (Addendum)
Physician Discharge Summary  Andrea [name] BHA:[id number] DOB: 1940/06/23 DOA: 08/12/2019  PCP: Mosie Lukes, MD  Admit date: 08/12/2019 Discharge date: 08/17/2019  Admitted From: Home Disposition: Home  Recommendations for Outpatient Follow-up:  1. Follow up with PCP in 1-2 weeks 2. Follow-up with cardiologist in 4 weeks.  Home Health: None Equipment/Devices: Oxygen (3 L via nasal cannula on ambulation)  Discharge Condition: Fair CODE STATUS: DNR Diet recommendation: Heart Healthy with 2 g sodium.  Discharge Diagnoses:  Principal Problem:   COPD with acute exacerbation ([place]) Acute respiratory failure with hypoxemia (HCC)  Active Problems:   Atrial fibrillation with RVR, chronic (HCC)   OSA (obstructive sleep apnea)   Cor pulmonale, acute versus acute on chronic (HCC)   Acute on chronic diastolic CHF (congestive heart failure) (HCC)   Depression, recurrent (HCC)   HTN (hypertension)   Coronary artery disease involving coronary bypass graft of native heart with angina pectoris ([place])   PERIPHERAL VASCULAR DISEASE   Brief narrative/HPI 79 year old female with history of COPD not on home O2, CAD s/p CABG and history of cardiac stents, AAA, carotid artery stenosis, major depressive disorder, hypertension, hypothyroidism, A. fib on Xarelto, pulmonary hypertension who presented with increased shortness of breath.  Admitted for COPD with acute exacerbation and acute on chronic diastolic CHF versus acute on chronic cor pulmonale.   Hospital course Acute respiratory failure with hypoxia (HCC) COPD with acute exacerbation Improved with IV Solu-Medrol, transition to oral prednisone.  Completed course of azithromycin.  Tested negative for COVID-19.  Symptoms improving on nebs. Does not smoke currently. Patient showing oxygen desaturation in the 80s on ambulation and will require home O2 with ambulation on discharge.  Clinically improved and will discharge on oral prednisone taper  over the next 12 days, resume home albuterol inhaler, nebs and Stiolto..  Active problems Acute on chronic diastolic CHF Cor pulmonale, acute versus acute on chronic 2D echo with normal EF of 65% but showed moderately reduced RV function and pulmonary artery hypertension (pulmonary artery pressure of 51 mmHg).  Indeterminate diastolic function. Improved with IV Lasix and euvolemic.  Patient taking Lasix 20 mg every other day and will switch to once daily upon discharge. Continue beta-blocker upon discharge. Follow-up with her cardiologist Dr. Rockey Situ in 3-4 weeks.  Chronic A. fib with RVR Resume home dose Cardizem and Xarelto.  Cardiology consult appreciated and suspected rapid A. fib triggered by COPD exacerbation and CHF/cor pulmonale.  Rate currently well controlled as patient now euvolemic.  2D echo as above.  Continue Cardizem and Xarelto and outpatient follow-up with her cardiologist.  Obstructive sleep apnea Continue nighttime CPAP.  Coronary artery disease Continue home dose Plavix, Imdur and statin.  Essential hypertension Stable.  Continue amlodipine and Cardizem.  Continue daily Lasix.   Hypothyroidism Continue Synthroid  Disposition: Home Family communication: None Procedure: 2D echo  Consults: Cardiology  Discharge Instructions   Allergies as of 08/17/2019      Reactions   Erythromycin Other (See Comments)   Tongue burns   Meperidine Nausea And Vomiting   Shellfish Allergy Nausea And Vomiting   Ciprofloxacin Rash, Other (See Comments)   Rash from IV   Codeine Nausea And Vomiting   Penicillins Rash, Other (See Comments)   Has patient had a PCN reaction causing immediate rash, facial/tongue/throat swelling, SOB or lightheadedness with hypotension:unsure Has patient had a PCN reaction causing severe rash involving mucus membranes or skin necrosis:No Has patient had a PCN reaction that required hospitalization:No Has patient had a PCN  reaction occurring within  the last 10 years:No If all of the above answers are "NO", then may proceed with Cephalosporin use.   Tape Rash   pls use paper tape      Medication List    TAKE these medications   acetaminophen 650 MG CR tablet Commonly known as: TYLENOL Take 1,300 mg by mouth 2 (two) times daily.   albuterol (2.5 MG/3ML) 0.083% nebulizer solution Commonly known as: PROVENTIL Take 3 mLs (2.5 mg total) by nebulization every 4 (four) hours as needed for wheezing or shortness of breath. Dx:J44.9 What changed: Another medication with the same name was changed. Make sure you understand how and when to take each.   albuterol 108 (90 Base) MCG/ACT inhaler Commonly known as: VENTOLIN HFA INHALE 2 PUFFS INTO THE LUNGS EVERY 6 (SIX) HOURS AS NEEDED FOR WHEEZING. What changed:   how much to take  how to take this  when to take this  reasons to take this  additional instructions   amLODipine 5 MG tablet Commonly known as: NORVASC TAKE 1 TABLET BY MOUTH ONCE DAILY   azelastine 0.1 % nasal spray Commonly known as: ASTELIN Place 2 sprays into both nostrils at bedtime as needed for rhinitis.   Biotin 5 MG Tabs Take 5 mg by mouth daily.   CALCIUM + D PO Take 1 tablet by mouth daily.   clopidogrel 75 MG tablet Commonly known as: PLAVIX TAKE 1 TABLET BY MOUTH ONCE A DAY   dextromethorphan-guaiFENesin 30-600 MG 12hr tablet Commonly known as: MUCINEX DM Take 1 tablet by mouth 2 (two) times daily.   diltiazem 240 MG 24 hr capsule Commonly known as: CARDIZEM CD Take 1 capsule (240 mg total) by mouth daily. Pt must keep appt in May to continue receiving refills   diltiazem 30 MG tablet Commonly known as: Cardizem Take one tablet by mouth every 4 hours AS NEEDED for A-fib HR > 100 as long as BP > 100. What changed:   how much to take  how to take this  when to take this  reasons to take this  additional instructions   ezetimibe 10 MG tablet Commonly known as: ZETIA TAKE 1 TABLET  BY MOUTH DAILY What changed: when to take this   furosemide 20 MG tablet Commonly known as: LASIX Take 1 tablet (20 mg total) by mouth daily. What changed: when to take this   HYDROcodone-acetaminophen 10-325 MG tablet Commonly known as: NORCO Take 1 tablet by mouth every 6 (six) hours as needed for severe pain.   isosorbide mononitrate 60 MG 24 hr tablet Commonly known as: IMDUR TAKE 1 TABLET BY MOUTH TWICE A DAY   Krill Oil 500 MG Caps Take 500 mg by mouth daily.   levothyroxine 50 MCG tablet Commonly known as: SYNTHROID TAKE 1 TABLET BY MOUTH ONCE DAILY What changed: when to take this   nitroGLYCERIN 0.4 MG SL tablet Commonly known as: NITROSTAT PLACE 1 TABLET (0.4 MG TOTAL) UNDER THE TONGUE EVERY 5 (FIVE) MINUTES AS NEEDED FOR CHEST PAIN.   omeprazole 20 MG capsule Commonly known as: PRILOSEC TAKE 1 CAPSULE BY MOUTH ONCE DAILY   predniSONE 20 MG tablet Commonly known as: DELTASONE Take 1 tablet (20 mg total) by mouth daily with breakfast.   rosuvastatin 40 MG tablet Commonly known as: CRESTOR TAKE 1 TABLET BY MOUTH ONCE A DAY What changed: when to take this   sertraline 100 MG tablet Commonly known as: ZOLOFT Take 1 tablet (100 mg total) by  mouth daily.   Stiolto Respimat 2.5-2.5 MCG/ACT Aers Generic drug: Tiotropium Bromide-Olodaterol INHALE 2 PUFFS INTO THE LUNGS ONCE DAILY What changed: See the new instructions.   SYSTANE OP Place 1 drop into both eyes 2 (two) times a day.   Vitamin D (Ergocalciferol) 1.25 MG (50000 UNIT) Caps capsule Commonly known as: DRISDOL TAKE 1 CAPSULE BY MOUTH ONCE A WEEK   Vitamin D3 50 MCG (2000 UT) Tabs Take 2,000 Units by mouth daily.   Xarelto 15 MG Tabs tablet Generic drug: Rivaroxaban TAKE 1 TABLET BY MOUTH ONCE A DAY WITH SUPPER What changed: See the new instructions.   zinc gluconate 50 MG tablet Take 50 mg by mouth daily.      Follow-up Information    Mosie Lukes, MD. Schedule an appointment as soon  as possible for a visit in 2 week(s).   Specialty: Family Medicine Contact information: [place] [address] [phone]        Constance Haw, MD .   Specialty: Cardiology Contact information: [address] [place] [address] [phone]          Allergies  Allergen Reactions  . Erythromycin Other (See Comments)    Tongue burns  . Meperidine Nausea And Vomiting  . Shellfish Allergy Nausea And Vomiting  . Ciprofloxacin Rash and Other (See Comments)    Rash from IV  . Codeine Nausea And Vomiting  . Penicillins Rash and Other (See Comments)    Has patient had a PCN reaction causing immediate rash, facial/tongue/throat swelling, SOB or lightheadedness with hypotension:unsure Has patient had a PCN reaction causing severe rash involving mucus membranes or skin necrosis:No Has patient had a PCN reaction that required hospitalization:No Has patient had a PCN reaction occurring within the last 10 years:No If all of the above answers are "NO", then may proceed with Cephalosporin use.   . Tape Rash    pls use paper tape      Procedures/Studies: DG Chest 2 View  Result Date: 08/12/2019 CLINICAL DATA:  Worsening shortness of breath and LEFT-sided chest pressure, fatigue, history coronary artery disease post stenting and bypass surgery, hypertension, COPD, former smoker EXAM: CHEST - 2 VIEW COMPARISON:  04/08/2019 FINDINGS: Enlargement of cardiac silhouette post median sternotomy. Mediastinal contours and pulmonary vascularity normal. Atherosclerotic calcification aorta. Emphysematous and bronchitic changes consistent with COPD. Biapical scarring and chronic interstitial prominence stable. No acute infiltrate, pleural effusion, or pneumothorax. Bones demineralized. IMPRESSION: Enlargement of cardiac silhouette post median sternotomy. COPD changes with biapical scarring and chronic interstitial prominence. No acute abnormalities.  Aortic Atherosclerosis (ICD10-I70.0) and Emphysema (ICD10-J43.9). Electronically Signed   By: Lavonia Dana M.D.   On: 08/12/2019 13:12   DG Chest Port 1 View  Result Date: 08/15/2019 CLINICAL DATA:  Shortness of breath. EXAM: PORTABLE CHEST 1 VIEW COMPARISON:  08/12/2019 FINDINGS: Persistent cardiomegaly. Pulmonary vascularity is normal. Lungs are clear. CABG. Coronary artery stent in place. Aortic atherosclerosis. No acute bone abnormality. IMPRESSION: No acute abnormality.  Unchanged cardiomegaly. Electronically Signed   By: Lorriane Shire M.D.   On: 08/15/2019 10:31   ECHOCARDIOGRAM COMPLETE  Result Date: 08/17/2019    ECHOCARDIOGRAM REPORT   Patient Name:   Andrea [name] Date of Exam: 08/16/2019 Medical Rec #:  [id number]     Height:       62.0 in Accession #:    [id number]    Weight:       150.0 lb Date of Birth:  [birth year]    BSA:          1.692 m Patient Age:    58 years      BP:           144/96 mmHg Patient Gender: F             HR:           79 bpm. Exam Location:  [facility] Procedure: 2D Echo, Cardiac Doppler and Color Doppler Indications:     R06.00 Dyspnea  History:         Patient has prior history of Echocardiogram examinations, most                  recent 12/10/2017. Risk Factors:Hypertension and Dyslipidemia.                  Thyroid disease. Shortness of breath. Peripheral vascular                  disease. Pneumonia. NSTEMI. Murmur. COPD. Coronary artery                  disease. Bradycardia. AAA.  Sonographer:     Wilford Sports Rodgers-Jones Referring Phys:  [id number] [place] Diagnosing Phys: Nelva Bush MD IMPRESSIONS  1. Left ventricular ejection fraction, by estimation, is 60 to 65%. The left ventricle has normal function. The left ventricle has no regional wall motion abnormalities. Left ventricular diastolic parameters are indeterminate.  2. Right ventricular systolic function is moderately reduced. The right ventricular size is moderately enlarged. moderately increased right ventricular  wall thickness. There is moderately elevated pulmonary artery systolic pressure. The estimated right ventricular systolic pressure is 18.2 mmHg.  3. Left atrial size was moderately dilated.  4. Right atrial size was moderately dilated.  5. The mitral valve is degenerative. Mild to moderate mitral valve regurgitation.  6. Tricuspid valve regurgitation is severe.  7. The aortic valve is tricuspid. Aortic valve regurgitation is not visualized. Mild to moderate aortic valve sclerosis/calcification is present, without any evidence of aortic stenosis.  8. The inferior vena cava is dilated in size with <50% respiratory variability, suggesting right atrial pressure of 15 mmHg. FINDINGS  Left Ventricle: Left ventricular ejection fraction, by estimation, is 60 to 65%. The left ventricle has normal function. The left ventricle has no regional wall motion abnormalities. The left ventricular internal cavity size was normal in size. There is  no left ventricular hypertrophy. Left ventricular diastolic parameters are indeterminate. Right Ventricle: The right ventricular size is moderately enlarged. Moderately increased right ventricular wall thickness. Right ventricular systolic function is moderately reduced. There is moderately elevated pulmonary artery systolic pressure. The tricuspid regurgitant velocity is 3.03 m/s, and with an assumed right atrial pressure of 15 mmHg, the estimated right ventricular systolic pressure is 99.3 mmHg. Left Atrium: Left atrial size was moderately dilated. Right Atrium: Right atrial size was moderately dilated. Pericardium: There is no evidence of pericardial effusion. Mitral Valve: The mitral valve is degenerative in appearance. There is mild thickening of the mitral valve leaflet(s). Mild to moderate mitral valve regurgitation. Tricuspid Valve: The tricuspid valve is normal in structure. Tricuspid valve regurgitation is severe. The flow in the hepatic veins is reversed during ventricular  systole. Aortic Valve: The aortic valve is tricuspid. . There is mild thickening and mild calcification of the aortic valve. Aortic valve regurgitation is not visualized. Mild to moderate aortic valve sclerosis/calcification is present, without any evidence of aortic stenosis. There is mild thickening  of the aortic valve. There is mild calcification of the aortic valve. Pulmonic Valve: The pulmonic valve was not well visualized. Pulmonic valve regurgitation is not visualized. No evidence of pulmonic stenosis. There is no evidence of pulmonic valve vegetation. Aorta: The aortic root is normal in size and structure. Pulmonary Artery: The pulmonary artery is not well seen. Venous: The inferior vena cava is dilated in size with less than 50% respiratory variability, suggesting right atrial pressure of 15 mmHg. IAS/Shunts: No atrial level shunt detected by color flow Doppler.  LEFT VENTRICLE PLAX 2D LVIDd:         5.07 cm Diastology LVIDs:         3.25 cm LV e' lateral:   12.60 cm/s LV PW:         0.89 cm LV E/e' lateral: 11.7 LV IVS:        0.79 cm LV e' medial:    8.78 cm/s                        LV E/e' medial:  16.9  RIGHT VENTRICLE RV Basal diam:  4.08 cm RV S prime:     8.79 cm/s TAPSE (M-mode): 1.4 cm LEFT ATRIUM             Index       RIGHT ATRIUM           Index LA diam:        5.90 cm 3.49 cm/m  RA Area:     25.90 cm LA Vol (A2C):   86.7 ml 51.25 ml/m RA Volume:   94.50 ml  55.86 ml/m LA Vol (A4C):   79.2 ml 46.82 ml/m LA Biplane Vol: 83.4 ml 49.30 ml/m   AORTA Ao Root diam: 2.60 cm MV E velocity: 148.00 cm/s  TRICUSPID VALVE                             TR Peak grad:   36.7 mmHg                             TR Vmax:        303.00 cm/s Nelva Bush MD Electronically signed by Nelva Bush MD Signature Date/Time: 08/17/2019/8:00:09 AM    Final        Subjective: Reports breathing better.  O2 sats dropped to low to mid 80s on ambulation on room air.  Heart rate stable on the monitor both at rest  and ambulation.  Feels her breathing to be back to her baseline.  Discharge Exam: Vitals:   08/17/19 0803 08/17/19 0822  BP: 130/89   Pulse:    Resp: 14   Temp: 97.8 F (36.6 C)   SpO2:  96%   Vitals:   08/16/19 1952 08/16/19 2330 08/17/19 0803 08/17/19 0822  BP:   130/89   Pulse:  78    Resp:   14   Temp:  98.4 F (36.9 C) 97.8 F (36.6 C)   TempSrc:  Oral Oral   SpO2: 96%   96%  Weight:      Height:        General: Elderly female not in distress HEENT: Moist mucosa, supple neck Chest: Clear bilaterally CVs: S1-S2 regular, no murmurs GI: Soft, nondistended, nontender Musculoskeletal: Warm, no edema    The results of significant diagnostics from this hospitalization (including imaging, microbiology,  ancillary and laboratory) are listed below for reference.     Microbiology: Recent Results (from the past 240 hour(s))  SARS CORONAVIRUS 2 (TAT 6-24 HRS) Nasopharyngeal Nasopharyngeal Swab     Status: None   Collection Time: 08/12/19  8:25 PM   Specimen: Nasopharyngeal Swab  Result Value Ref Range Status   SARS Coronavirus 2 NEGATIVE NEGATIVE Final    Comment: (NOTE) SARS-CoV-2 target nucleic acids are NOT DETECTED. The SARS-CoV-2 RNA is generally detectable in upper and lower respiratory specimens during the acute phase of infection. Negative results do not preclude SARS-CoV-2 infection, do not rule out co-infections with other pathogens, and should not be used as the sole basis for treatment or other patient management decisions. Negative results must be combined with clinical observations, patient history, and epidemiological information. The expected result is Negative. Fact Sheet for Patients: SugarRoll.be Fact Sheet for Healthcare Providers: https://www.woods-mathews.com/ This test is not yet approved or cleared by the [place] FDA and  has been authorized for detection and/or diagnosis of SARS-CoV-2 by FDA  under an Emergency Use Authorization (EUA). This EUA will remain  in effect (meaning this test can be used) for the duration of the COVID-19 declaration under Section 56 4(b)(1) of the Act, 21 U.S.C. section 360bbb-3(b)(1), unless the authorization is terminated or revoked sooner. Performed at [facility], [place] [address] [place],  [address]      Labs: BNP (last 3 results) Recent Labs    08/12/19 1238 08/14/19 0751 08/16/19 0616  BNP 337.0* 621.0* 720.9*   Basic Metabolic Panel: Recent Labs  Lab 08/12/19 1238 08/14/19 0751 08/15/19 0559 08/16/19 0616 08/17/19 0444  NA 142 140 140 143 143  K 3.9 4.3 3.9 4.0 4.0  CL 106 102 104 106 102  CO2 25 28 28 29 31   GLUCOSE 97 100* 105* 109* 110*  BUN 15 28* 33* 27* 27*  CREATININE 0.93 1.02* 0.75 0.97 0.97  CALCIUM 9.2 9.2 8.8* 9.0 9.2   Liver Function Tests: No results for input(s): AST, ALT, ALKPHOS, BILITOT, PROT, ALBUMIN in the last 168 hours. No results for input(s): LIPASE, AMYLASE in the last 168 hours. No results for input(s): AMMONIA in the last 168 hours. CBC: Recent Labs  Lab 08/12/19 1238 08/15/19 0559  WBC 6.0 8.9  HGB 12.0 10.7*  HCT 38.8 34.9*  MCV 81.9 83.5  PLT 160 179   Cardiac Enzymes: No results for input(s): CKTOTAL, CKMB, CKMBINDEX, TROPONINI in the last 168 hours. BNP: Invalid input(s): POCBNP CBG: No results for input(s): GLUCAP in the last 168 hours. D-Dimer No results for input(s): DDIMER in the last 72 hours. Hgb A1c No results for input(s): HGBA1C in the last 72 hours. Lipid Profile No results for input(s): CHOL, HDL, LDLCALC, TRIG, CHOLHDL, LDLDIRECT in the last 72 hours. Thyroid function studies No results for input(s): TSH, T4TOTAL, T3FREE, THYROIDAB in the last 72 hours.  Invalid input(s): FREET3 Anemia work up No results for input(s): VITAMINB12, FOLATE, FERRITIN, TIBC, IRON, RETICCTPCT in the last 72 hours. Urinalysis    Component Value Date/Time   COLORURINE  YELLOW 06/28/2018 [place] 06/28/2018 1533   LABSPEC 1.020 06/28/2018 1533   PHURINE 5.5 06/28/2018 1533   GLUCOSEU NEGATIVE 06/28/2018 1533   HGBUR NEGATIVE 06/28/2018 [place] 06/28/2018 1533   BILIRUBINUR negative 03/27/2015 1833   KETONESUR NEGATIVE 06/28/2018 1533   PROTEINUR negative 03/27/2015 1833   PROTEINUR NEGATIVE 07/15/2014 1548   UROBILINOGEN 0.2 06/28/2018 1533  NITRITE NEGATIVE 06/28/2018 1533   LEUKOCYTESUR NEGATIVE 06/28/2018 1533   Sepsis Labs Invalid input(s): PROCALCITONIN,  WBC,  LACTICIDVEN Microbiology Recent Results (from the past 240 hour(s))  SARS CORONAVIRUS 2 (TAT 6-24 HRS) Nasopharyngeal Nasopharyngeal Swab     Status: None   Collection Time: 08/12/19  8:25 PM   Specimen: Nasopharyngeal Swab  Result Value Ref Range Status   SARS Coronavirus 2 NEGATIVE NEGATIVE Final    Comment: (NOTE) SARS-CoV-2 target nucleic acids are NOT DETECTED. The SARS-CoV-2 RNA is generally detectable in upper and lower respiratory specimens during the acute phase of infection. Negative results do not preclude SARS-CoV-2 infection, do not rule out co-infections with other pathogens, and should not be used as the sole basis for treatment or other patient management decisions. Negative results must be combined with clinical observations, patient history, and epidemiological information. The expected result is Negative. Fact Sheet for Patients: SugarRoll.be Fact Sheet for Healthcare Providers: https://www.woods-mathews.com/ This test is not yet approved or cleared by the [place] FDA and  has been authorized for detection and/or diagnosis of SARS-CoV-2 by FDA under an Emergency Use Authorization (EUA). This EUA will remain  in effect (meaning this test can be used) for the duration of the COVID-19 declaration under Section 56 4(b)(1) of the Act, 21 U.S.C. section 360bbb-3(b)(1), unless the  authorization is terminated or revoked sooner. Performed at [facility], [place] [address], [place], [place] [address]      Time coordinating discharge: 35 minutes  SIGNED:   Louellen Molder, MD  [facility] 08/17/2019, 11:08 AM Pager   If 7PM-7AM, please contact night-coverage [email or web address] Password [email or web address]

## 2019-08-17 NOTE — Progress Notes (Signed)
Progress Note  Patient Name: Andrea [name] Date of Encounter: 08/17/2019  Primary Cardiologist: Dr. Rockey Situ  Subjective   Patient still complains of shortness of breath with walking to the bathroom.  Denies chest pain, palpitations or edema.  Inpatient Medications    Scheduled Meds: . clopidogrel  75 mg Oral Daily  . dextromethorphan-guaiFENesin  1 tablet Oral BID  . diltiazem  240 mg Oral Daily  . ezetimibe  10 mg Oral Daily  . furosemide  20 mg Intravenous BID  . ipratropium-albuterol  3 mL Nebulization TID  . isosorbide mononitrate  60 mg Oral BID  . levothyroxine  50 mcg Oral Daily  . pantoprazole  40 mg Oral Daily  . potassium chloride  10 mEq Oral BID  . Rivaroxaban  15 mg Oral Q supper  . rosuvastatin  40 mg Oral Daily  . sertraline  100 mg Oral Daily  . sodium chloride flush  3 mL Intravenous Q12H   Continuous Infusions: . sodium chloride     PRN Meds: sodium chloride, albuterol, chlorpheniramine-HYDROcodone, HYDROcodone-acetaminophen, sodium chloride flush   Vital Signs    Vitals:   08/16/19 1952 08/16/19 2330 08/17/19 0803 08/17/19 0822  BP:   130/89   Pulse:  78    Resp:   14   Temp:  98.4 F (36.9 C) 97.8 F (36.6 C)   TempSrc:  Oral Oral   SpO2: 96%   96%  Weight:      Height:        Intake/Output Summary (Last 24 hours) at 08/17/2019 1133 Last data filed at 08/16/2019 1426 Gross per 24 hour  Intake 240 ml  Output --  Net 240 ml   Last 3 Weights 08/12/2019 06/13/2019 04/12/2019  Weight (lbs) 150 lb 150 lb 12.8 oz 150 lb 8 oz  Weight (kg) 68.04 kg 68.402 kg 68.266 kg      Telemetry    Atrial fibrillation heart rate 83- Personally Reviewed  ECG    No new ECG obtained  Physical Exam   GEN: No acute distress.   Neck: No JVD Cardiac:  Irregular irregular, no murmurs, rubs, or gallops.  Respiratory:  Rhonchorous breath sounds at bases GI: Soft, nontender, non-distended  MS: No edema; No deformity. Neuro:  Nonfocal  Psych: Normal  affect   Labs    High Sensitivity Troponin:   Recent Labs  Lab 08/12/19 1238 08/12/19 1447  TROPONINIHS 7 7      Chemistry Recent Labs  Lab 08/15/19 0559 08/16/19 0616 08/17/19 0444  NA 140 143 143  K 3.9 4.0 4.0  CL 104 106 102  CO2 28 29 31   GLUCOSE 105* 109* 110*  BUN 33* 27* 27*  CREATININE 0.75 0.97 0.97  CALCIUM 8.8* 9.0 9.2  GFRNONAA >60 55* 55*  GFRAA >60 >60 >60  ANIONGAP 8 8 10      Hematology Recent Labs  Lab 08/12/19 1238 08/15/19 0559  WBC 6.0 8.9  RBC 4.74 4.18  HGB 12.0 10.7*  HCT 38.8 34.9*  MCV 81.9 83.5  MCH 25.3* 25.6*  MCHC 30.9 30.7  RDW 14.8 14.9  PLT 160 179    BNP Recent Labs  Lab 08/12/19 1238 08/14/19 0751 08/16/19 0616  BNP 337.0* 621.0* 486.0*     DDimer No results for input(s): DDIMER in the last 168 hours.   Radiology    ECHOCARDIOGRAM COMPLETE  Result Date: 08/17/2019    ECHOCARDIOGRAM REPORT   Patient Name:   Andrea [name] Date of Exam: 08/16/2019 Medical  Rec #:  [id number]     Height:       62.0 in Accession #:    [id number]    Weight:       150.0 lb Date of Birth:  Jun 08, 1940    BSA:          1.692 m Patient Age:    79 years      BP:           144/96 mmHg Patient Gender: F             HR:           79 bpm. Exam Location:  [facility] Procedure: 2D Echo, Cardiac Doppler and Color Doppler Indications:     R06.00 Dyspnea  History:         Patient has prior history of Echocardiogram examinations, most                  recent 12/10/2017. Risk Factors:Hypertension and Dyslipidemia.                  Thyroid disease. Shortness of breath. Peripheral vascular                  disease. Pneumonia. NSTEMI. Murmur. COPD. Coronary artery                  disease. Bradycardia. AAA.  Sonographer:     Wilford Sports Rodgers-Jones Referring Phys:  [id number] [place] Diagnosing Phys: Nelva Bush MD IMPRESSIONS  1. Left ventricular ejection fraction, by estimation, is 60 to 65%. The left ventricle has normal function. The left ventricle has no regional  wall motion abnormalities. Left ventricular diastolic parameters are indeterminate.  2. Right ventricular systolic function is moderately reduced. The right ventricular size is moderately enlarged. moderately increased right ventricular wall thickness. There is moderately elevated pulmonary artery systolic pressure. The estimated right ventricular systolic pressure is 02.4 mmHg.  3. Left atrial size was moderately dilated.  4. Right atrial size was moderately dilated.  5. The mitral valve is degenerative. Mild to moderate mitral valve regurgitation.  6. Tricuspid valve regurgitation is severe.  7. The aortic valve is tricuspid. Aortic valve regurgitation is not visualized. Mild to moderate aortic valve sclerosis/calcification is present, without any evidence of aortic stenosis.  8. The inferior vena cava is dilated in size with <50% respiratory variability, suggesting right atrial pressure of 15 mmHg. FINDINGS  Left Ventricle: Left ventricular ejection fraction, by estimation, is 60 to 65%. The left ventricle has normal function. The left ventricle has no regional wall motion abnormalities. The left ventricular internal cavity size was normal in size. There is  no left ventricular hypertrophy. Left ventricular diastolic parameters are indeterminate. Right Ventricle: The right ventricular size is moderately enlarged. Moderately increased right ventricular wall thickness. Right ventricular systolic function is moderately reduced. There is moderately elevated pulmonary artery systolic pressure. The tricuspid regurgitant velocity is 3.03 m/s, and with an assumed right atrial pressure of 15 mmHg, the estimated right ventricular systolic pressure is 09.7 mmHg. Left Atrium: Left atrial size was moderately dilated. Right Atrium: Right atrial size was moderately dilated. Pericardium: There is no evidence of pericardial effusion. Mitral Valve: The mitral valve is degenerative in appearance. There is mild thickening of the  mitral valve leaflet(s). Mild to moderate mitral valve regurgitation. Tricuspid Valve: The tricuspid valve is normal in structure. Tricuspid valve regurgitation is severe. The flow in the hepatic veins is reversed during ventricular systole. Aortic Valve: The aortic  valve is tricuspid. . There is mild thickening and mild calcification of the aortic valve. Aortic valve regurgitation is not visualized. Mild to moderate aortic valve sclerosis/calcification is present, without any evidence of aortic stenosis. There is mild thickening of the aortic valve. There is mild calcification of the aortic valve. Pulmonic Valve: The pulmonic valve was not well visualized. Pulmonic valve regurgitation is not visualized. No evidence of pulmonic stenosis. There is no evidence of pulmonic valve vegetation. Aorta: The aortic root is normal in size and structure. Pulmonary Artery: The pulmonary artery is not well seen. Venous: The inferior vena cava is dilated in size with less than 50% respiratory variability, suggesting right atrial pressure of 15 mmHg. IAS/Shunts: No atrial level shunt detected by color flow Doppler.  LEFT VENTRICLE PLAX 2D LVIDd:         5.07 cm Diastology LVIDs:         3.25 cm LV e' lateral:   12.60 cm/s LV PW:         0.89 cm LV E/e' lateral: 11.7 LV IVS:        0.79 cm LV e' medial:    8.78 cm/s                        LV E/e' medial:  16.9  RIGHT VENTRICLE RV Basal diam:  4.08 cm RV S prime:     8.79 cm/s TAPSE (M-mode): 1.4 cm LEFT ATRIUM             Index       RIGHT ATRIUM           Index LA diam:        5.90 cm 3.49 cm/m  RA Area:     25.90 cm LA Vol (A2C):   86.7 ml 51.25 ml/m RA Volume:   94.50 ml  55.86 ml/m LA Vol (A4C):   79.2 ml 46.82 ml/m LA Biplane Vol: 83.4 ml 49.30 ml/m   AORTA Ao Root diam: 2.60 cm MV E velocity: 148.00 cm/s  TRICUSPID VALVE                             TR Peak grad:   36.7 mmHg                             TR Vmax:        303.00 cm/s Nelva Bush MD Electronically signed  by Nelva Bush MD Signature Date/Time: 08/17/2019/8:00:09 AM    Final     Cardiac Studies   TTE 08/16/2019 1. Left ventricular ejection fraction, by estimation, is 60 to 65%. The  left ventricle has normal function. The left ventricle has no regional  wall motion abnormalities. Left ventricular diastolic parameters are  indeterminate.  2. Right ventricular systolic function is moderately reduced. The right  ventricular size is moderately enlarged. moderately increased right  ventricular wall thickness. There is moderately elevated pulmonary artery  systolic pressure. The estimated right  ventricular systolic pressure is 75.9 mmHg.  3. Left atrial size was moderately dilated.  4. Right atrial size was moderately dilated.  5. The mitral valve is degenerative. Mild to moderate mitral valve  regurgitation.  6. Tricuspid valve regurgitation is severe.  7. The aortic valve is tricuspid. Aortic valve regurgitation is not  visualized. Mild to moderate aortic valve sclerosis/calcification is  present, without any evidence of  aortic stenosis.  8. The inferior vena cava is dilated in size with <50% respiratory  variability, suggesting right atrial pressure of 15 mmHg.  Patient Profile     79 y.o. female with a hx of CAD s/p CABG in 1999 s/p PCI to the distal RCA in 2013, PCI x 2 in the SVG to RCA and mid LCx in 2017,afib on Xarelto, SVT s/p failed ablation in 02/2018, PVD s/p lower extremity stenting, carotid artery disease s/p left-sided CEA, pulmonary hypertension, COPD with prior tobacco use, infrarenal AAA, PE, HTN, HLD, asthma, hypothyroidism, spinal stenosis who is being seen today for dyspnea  Assessment & Plan    1. Dyspnea -Likely due to COPD. -Repeat echocardiogram shows normal EF of 60 to 65% -She is euvolemic on my exam.  No need for IV Lasix.  Will discontinue.  Can resume patient's oral dose of Lasix.  2.  Persistent atrial fibrillation -Currently in A. fib heart  rate controlled -Continue diltiazem, Xarelto  3.  hx of CAD/CABG -Denies symptoms of chest pain -Continue Plavix, Crestor.  No further cardiac testing is planned at this time.  Patient can be discharged from a cardiac perspective on above medications.  Follow-up with Dr. Rockey Situ in the office.  Please let us know if further input is needed.  Thank you for this consult.      Signed, Kate Sable, MD  08/17/2019, 11:33 AM

## 2019-08-17 NOTE — Progress Notes (Signed)
SATURATION QUALIFICATIONS: (This note is used to comply with regulatory documentation for home oxygen)  Patient Saturations on Room Air at Rest = 82%  Patient Saturations on Room Air while Ambulating =  82%  Patient Saturations on 2 Liters of oxygen while Ambulating = 92*%  Please briefly explain why patient needs home oxygen:

## 2019-08-17 NOTE — Progress Notes (Signed)
Pt ambulated from bed to doorway without oxygen. 02 sats dropped to 82% on room air. Currently requiring 2 liters to maintain sats greater 90%.

## 2019-08-17 NOTE — Plan of Care (Signed)
Pt d/ced home once O2 gets her.  Reviewed d/c instructions, meds and f/u appts.  Husband is transporting her home.

## 2019-08-17 NOTE — Plan of Care (Signed)
Patient understands current plan of care

## 2019-08-17 NOTE — TOC Initial Note (Signed)
Transition of Care [facility]) - Initial/Assessment Note    Patient Details  Name: Andrea [name] MRN: [id number] Date of Birth: 1940/10/22  Transition of Care [facility] CM/SW Contact:    Shelbie Hutching, RN Phone Number: 08/17/2019, 1:49 PM  Clinical Narrative:                 Patient is medically ready for discharge home today.  Patient needs home oxygen.  Order for oxygen has been faxed over to [employer].  [facility] will have oxygen delivered to the hospital and then patient will be ready for discharge.  Patient's husband will provide transportation for her home.   Expected Discharge Plan: Home/Self Care Barriers to Discharge: Equipment Delay(waiting for oxygen delivery)   Patient Goals and CMS Choice Patient states their goals for this hospitalization and ongoing recovery are:: Wants to be independent CMS Medicare.gov Compare Post Acute Care list provided to:: Patient Choice offered to / list presented to : Patient  Expected Discharge Plan and Services Expected Discharge Plan: Home/Self Care   Discharge Planning Services: CM Consult Post Acute Care Choice: Durable Medical Equipment Living arrangements for the past 2 months: Single Family Home Expected Discharge Date: 08/17/19                 DME Agency: [employer] Date DME Agency Contacted: 08/17/19 Time DME Agency Contacted: 80 Representative spoke with at DME Agency: Main office            Prior Living Arrangements/Services Living arrangements for the past 2 months: [place] with:: Spouse Patient language and need for interpreter reviewed:: Yes Do you feel safe going back to the place where you live?: Yes      Need for Family Participation in Patient Care: Yes (Comment)(COPD, new oxygen) Care giver support system in place?: Yes (comment)(husband) Current home services: DME(nebulizer and CPAP) Criminal Activity/Legal Involvement Pertinent to Current Situation/Hospitalization: No - Comment as needed  Activities  of Daily Living Home Assistive Devices/Equipment: Eyeglasses, Dentures (specify type), Hearing aid(Hearing aids at home) ADL Screening (condition at time of admission) Patient's cognitive ability adequate to safely complete daily activities?: Yes Is the patient deaf or have difficulty hearing?: No Does the patient have difficulty seeing, even when wearing glasses/contacts?: No Does the patient have difficulty concentrating, remembering, or making decisions?: No Patient able to express need for assistance with ADLs?: Yes Does the patient have difficulty dressing or bathing?: No Independently performs ADLs?: Yes (appropriate for developmental age) Does the patient have difficulty walking or climbing stairs?: No Weakness of Legs: None Weakness of Arms/Hands: None  Permission Sought/Granted Permission sought to share information with : Case Manager, Customer service manager Permission granted to share information with : Yes, Verbal Permission Granted     Permission granted to share info w AGENCY: [employer]        Emotional Assessment Appearance:: Appears stated age Attitude/Demeanor/Rapport: Engaged Affect (typically observed): Accepting Orientation: : Oriented to Self, Oriented to Place, Oriented to  Time, Oriented to Situation Alcohol / Substance Use: Not Applicable Psych Involvement: No (comment)  Admission diagnosis:  Acute respiratory failure with hypoxia ([place]) [J96.01] COPD with acute exacerbation (HCC) [J44.1] Chronic obstructive pulmonary disease with acute exacerbation (HCC) [J44.1] COPD (chronic obstructive pulmonary disease) ([place]) [J44.9] Patient Active Problem List   Diagnosis Date Noted  . Cor pulmonale, chronic ([place]) 08/17/2019  . Acute on chronic diastolic CHF (congestive heart failure) ([place]) 08/17/2019  . COPD (chronic obstructive pulmonary disease) ([place]) 08/14/2019  . COPD with acute exacerbation (  [place]) 08/12/2019  . Hypoxia 08/12/2019  . Atrial fibrillation,  chronic ([place]) 08/12/2019  . OSA (obstructive sleep apnea) 08/12/2019  . Vitamin D deficiency 06/13/2019  . Witnessed episode of apnea 03/07/2018  . Cervical cancer screening 12/09/2017  . Preventative health care 12/09/2017  . Chronic ethmoidal sinusitis 09/04/2017  . Hypothyroidism 09/30/2016  . Presbycusis of both ears 08/05/2016  . 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 Cramps, extremity 07/03/2016  . 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 Flank pain 07/03/2016  . Stable angina ([place]) 06/17/2016  . Hx of CABG 12/27/2015  . Hyperglycemia 10/23/2015  . Unstable angina ([place]) 09/25/2015  . Upper airway cough syndrome 03/23/2015  . Hypokalemia 07/24/2014  . Cystitis 04/11/2014  . Baker's cyst of knee 01/30/2014  . Thoracic back pain 01/24/2014  . Breast cancer screening 07/31/2013  . Dyspnea 05/09/2013  . Anosmia   . Fatigue 09/17/2011  . CAD (coronary artery disease) 06/06/2011  . NSTEMI (non-ST elevated myocardial infarction) ([place]) 04/22/2011  . Excessive somnolence disorder 12/03/2010  . BACK PAIN, LUMBAR 07/16/2010  . SINUSITIS, CHRONIC 05/07/2010  . Cough 09/10/2009  . SKIN CANCER, HX OF 04/03/2009  . Coronary artery disease involving coronary bypass graft of native heart with angina pectoris ([place]) 01/24/2009  . Carotid artery stenosis 01/24/2009  . 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  . AORTIC ANEUR [place] WITHOUT MENTION RUPTURE 01/24/2009  . POST-OP CARDIAC COMPLICATION 32/04/2481  . Hyperlipidemia 12/22/2006  . Depression, recurrent ([place]) 12/22/2006  . HTN (hypertension) 12/22/2006  . PERIPHERAL VASCULAR DISEASE 12/22/2006  . COPD GOLD II 12/22/2006  . GERD 12/22/2006  . Osteoporosis 12/22/2006   PCP:  Mosie Lukes, MD Pharmacy:   [facility], [address] [place] [address] Phone: [phone]  Fax: [phone]     Social Determinants of Health (SDOH) Interventions    Readmission Risk Interventions No flowsheet data found.

## 2019-08-18 ENCOUNTER — Telehealth: Payer: Self-pay | Admitting: *Deleted

## 2019-08-18 NOTE — Telephone Encounter (Signed)
Transition Care Management Follow-up Telephone Call  Date discharged? 08/17/19  How have you been since you were released from the hospital? Doing well   Do you understand why you were in the hospital? yes   Do you understand the discharge instructions? yes   Where were you discharged to? Home w/ husband   Items Reviewed:  Medications reviewed: yes and pt is now on 3L of O2 via [place] at all times. And CPAP at night.  Allergies reviewed: yes  Dietary changes reviewed: yes  Referrals reviewed: yes   Functional Questionnaire:   Activities of Daily Living (ADLs):   She states they are independent in the following: ambulation, bathing and hygiene, feeding, continence, grooming, toileting and dressing States they require assistance with the following: na   Any transportation issues/concerns?: no   Any patient concerns? no   Confirmed importance and date/time of follow-up visits scheduled yes  Provider Appointment booked with Dr.Wendling 08/23/19  Confirmed with patient if condition begins to worsen call PCP or go to the ER.  Patient was given the office number and encouraged to call back with question or concerns.  : yes

## 2019-08-22 ENCOUNTER — Other Ambulatory Visit: Payer: Self-pay

## 2019-08-22 DIAGNOSIS — I270 Primary pulmonary hypertension: Secondary | ICD-10-CM | POA: Diagnosis not present

## 2019-08-22 DIAGNOSIS — J449 Chronic obstructive pulmonary disease, unspecified: Secondary | ICD-10-CM | POA: Diagnosis not present

## 2019-08-23 ENCOUNTER — Encounter: Payer: Self-pay | Admitting: Family Medicine

## 2019-08-23 ENCOUNTER — Ambulatory Visit (INDEPENDENT_AMBULATORY_CARE_PROVIDER_SITE_OTHER): Payer: Medicare HMO | Admitting: Family Medicine

## 2019-08-23 ENCOUNTER — Other Ambulatory Visit: Payer: Self-pay

## 2019-08-23 VITALS — BP 118/72 | HR 98 | Temp 96.4°F | Ht 62.0 in | Wt 143.5 lb

## 2019-08-23 DIAGNOSIS — J9601 Acute respiratory failure with hypoxia: Secondary | ICD-10-CM | POA: Diagnosis not present

## 2019-08-23 DIAGNOSIS — J449 Chronic obstructive pulmonary disease, unspecified: Secondary | ICD-10-CM

## 2019-08-23 DIAGNOSIS — I25118 Atherosclerotic heart disease of native coronary artery with other forms of angina pectoris: Secondary | ICD-10-CM

## 2019-08-23 LAB — CBC
HCT: 43 % (ref 36.0–46.0)
Hemoglobin: 13.7 g/dL (ref 12.0–15.0)
MCHC: 31.9 g/dL (ref 30.0–36.0)
MCV: 78.9 fl (ref 78.0–100.0)
Platelets: 252 10*3/uL (ref 150.0–400.0)
RBC: 5.45 Mil/uL — ABNORMAL HIGH (ref 3.87–5.11)
RDW: 16.7 % — ABNORMAL HIGH (ref 11.5–15.5)
WBC: 13.6 10*3/uL — ABNORMAL HIGH (ref 4.0–10.5)

## 2019-08-23 LAB — BASIC METABOLIC PANEL
BUN: 17 mg/dL (ref 6–23)
CO2: 34 mEq/L — ABNORMAL HIGH (ref 19–32)
Calcium: 9.2 mg/dL (ref 8.4–10.5)
Chloride: 98 mEq/L (ref 96–112)
Creatinine, Ser: 0.98 mg/dL (ref 0.40–1.20)
GFR: 54.55 mL/min — ABNORMAL LOW (ref >60.00)
Glucose, Bld: 98 mg/dL (ref 70–99)
Potassium: 3.8 mEq/L (ref 3.5–5.1)
Sodium: 139 mEq/L (ref 135–145)

## 2019-08-23 MED ORDER — VITAMIN D (ERGOCALCIFEROL) 1.25 MG (50000 UNIT) PO CAPS: 50000.0000 [IU] | ORAL_CAPSULE | ORAL | 1 refills | Status: DC

## 2019-08-23 MED ORDER — NITROGLYCERIN 0.4 MG SL SUBL: 0.4000 mg | SUBLINGUAL_TABLET | SUBLINGUAL | 0 refills | Status: DC | PRN

## 2019-08-23 NOTE — Patient Instructions (Signed)
Give [place] 2-3 business days to get the results of your labs back.   I do not think you need oxygen while you are at rest. I would have it nearby/on when you are going on errands or leave the house.   If you do not hear anything about your referral in the next 1-2 weeks, call our office and ask for an update.  Let us know if you need anything.

## 2019-08-23 NOTE — Progress Notes (Signed)
Chief Complaint  Patient presents with  . Hospitalization Follow-up    shortness of breath    HPI Andrea [name] is a 79 y.o. y.o. female who presents for a transition of care visit.  Pt was discharged from [facility] on 08/17/2019.  Within 48 business hours of discharge our office contacted pt via telephone to coordinate care and needs.   3/19-3/24/21 at [facility] she was admitted for acute respiratory failure secondary to COPD exacerbation and possible acute on chronic heart failure.  Her Lasix was increased and she was given duo nebs, steroids, and azithromycin.  Currently, she reports feeling well.  She is currently on a steroid taper and begins 20 mg of prednisone daily tomorrow.  She is using albuterol inhaler 1-2 times daily.  Prior to admission, she was using it 3-4 times daily.  She has much improved chest tightness and her cough is much less frequent.  She is currently on 2 L of oxygen nasal cannula.  She uses a CPAP at night.  She is not having any significant weight changes and is compliant with the Lasix daily.  She is not having swelling in her lower extremities or chest pain.  Past Medical History:  Diagnosis Date  . AAA (abdominal aortic aneurysm) ([place]) 01/2009   AAA (2.8 x 3.0)  moderate RAS (left); 2.7 x 2.7 cm (07/11/05)  . Acute bronchitis 03/20/2013; 2017  . Angina   . Anosmia   . Asthma   . Baker's cyst of knee 01/30/2014  . Basal cell carcinoma    "back and left arm"  . Bradycardia    Metoprolol stopped 08/2011  . CAD (coronary artery disease)    s/CABG (reports IMA and 2 SVGs) back in 1999  Myoview normal 3/10; s/p PCI with DES to PL branch of distal RCA 09/2011; PCI +DES to SVG-RCA, PCI + DES to mid LCx 12/2015  . Cerebrovascular disease 01/2009   carotid u/s  R 0-39%   L 60-79%  . Chronic thoracic back pain   . COPD (chronic obstructive pulmonary disease) ([place])   . Depression   . Dizziness   . Dysrhythmia    hx of sinus  brady  . GERD (gastroesophageal reflux disease)   . Heart murmur   . Herniated lumbar disc without myelopathy   . History of blood transfusion 1999   "when I had the bypass; had a PE"  . Hyperglycemia 10/23/2015  . Hyperlipidemia   . Hypertension   . Hypothyroidism 09/30/2016  . Medicare annual wellness visit, subsequent 07/31/2013  . NSTEMI (non-ST elevated myocardial infarction) ([place]) 11/12   Cath showed atretic IMA graft to the LAD, SVG to PD was patent but the continuation of this graft to the PL branch was occluded; there were L to R collaterals; Mid LAD had a 60 to 70% stenosis. She has been treated medically.  Neg Myoview 05/2011  . Osteoarthritis of back   . Osteoporosis   . Pneumonia "several times"  . Pulmonary embolism ([place]) 1999   "after my bypass"  . PVD (peripheral vascular disease) ([place])   . Shortness of breath   . Squamous carcinoma    "nose"  . Thyroid disease    Hypothyroid  . Unstable angina ([place]) 09/25/2015   Past Surgical History:  Procedure Laterality Date  . ABDOMINAL AORTIC ANEURYSM REPAIR     pt denies this hx on 01/02/2016  . BASAL CELL CARCINOMA EXCISION    . CARDIAC CATHETERIZATION N/A 01/02/2016  Procedure: Left Heart Cath and Coronary Angiography;  Surgeon: Wellington Hampshire, MD;  Location: [place] [facility];  Service: Cardiovascular;  Laterality: N/A;  . CARDIAC CATHETERIZATION  1996; 1999  . CARDIAC CATHETERIZATION N/A 06/25/2016   Procedure: Left Heart Cath and Cors/Grafts Angiography;  Surgeon: Wellington Hampshire, MD;  Location: [place] [facility];  Service: Cardiovascular;  Laterality: N/A;  . CAROTID ENDARTERECTOMY Left 01/02/2011  . CATARACT EXTRACTION W/ INTRAOCULAR LENS  IMPLANT, BILATERAL    . CLOSED REDUCTION NASAL FRACTURE  11/2007  . CORONARY ANGIOPLASTY WITH STENT PLACEMENT  10/10/2011   drug eluting  to rc & saphenous  . CORONARY ARTERY BYPASS GRAFT  1999   "CABG X3"  . DILATION AND CURETTAGE OF UTERUS    . FEMORAL ARTERY STENT Bilateral   .  FRACTURE SURGERY    . LEFT HEART CATHETERIZATION WITH CORONARY/GRAFT ANGIOGRAM N/A 04/22/2011   Procedure: LEFT HEART CATHETERIZATION WITH Beatrix Fetters;  Surgeon: Jolaine Artist, MD;  Location: [facility] C[facility] LAB;  Service: Cardiovascular;  Laterality: N/A;  . LEFT HEART CATHETERIZATION WITH CORONARY/GRAFT ANGIOGRAM N/A 10/10/2011   Procedure: LEFT HEART CATHETERIZATION WITH Beatrix Fetters;  Surgeon: Peter M Martinique, MD;  Location: [facility] CATH LAB;  Service: Cardiovascular;  Laterality: N/A;  . NASAL SINUS SURGERY     twice  . SQUAMOUS CELL CARCINOMA EXCISION    . SVT ABLATION N/A 03/19/2018   Procedure: SVT ABLATION;  Surgeon: Constance Haw, MD;  Location: [place] [facility];  Service: Cardiovascular;  Laterality: N/A;  . TUBAL LIGATION     Family History  Problem Relation Age of Onset  . Heart attack Mother 37  . Diabetes Mother   . Colon cancer Father 66  . Lung cancer Father        smoked  . Heart disease Father   . Hypertension Father   . Angina Father   . Lung cancer Sister        smoked  . Hypothyroidism Sister   . Hypertension Brother   . Heart attack Brother   . Heart disease Brother        PTCA with Stent  . Heart attack Brother        CABG  . Heart disease Brother        CABG with 1 bypass  . Aneurysm Brother        brain  . Alcohol abuse Brother   . Hyperlipidemia Daughter   . Diabetes Maternal Grandmother   . Stroke Maternal Grandmother   . Cirrhosis Maternal Grandfather   . Stroke Paternal Grandmother   . Obesity Daughter   . Obesity Son    Allergies as of 08/23/2019      Reactions   Erythromycin Other (See Comments)   Tongue burns   Meperidine Nausea And Vomiting   Shellfish Allergy Nausea And Vomiting   Ciprofloxacin Rash, Other (See Comments)   Rash from IV   Codeine Nausea And Vomiting   Penicillins Rash, Other (See Comments)   Has patient had a PCN reaction causing immediate rash, facial/tongue/throat swelling, SOB or  lightheadedness with hypotension:unsure Has patient had a PCN reaction causing severe rash involving mucus membranes or skin necrosis:No Has patient had a PCN reaction that required hospitalization:No Has patient had a PCN reaction occurring within the last 10 years:No If all of the above answers are "NO", then may proceed with Cephalosporin use.   Tape Rash   pls use paper tape      Medication  List       Accurate as of August 23, 2019 12:08 PM. If you have any questions, ask your nurse or doctor.        acetaminophen 650 MG CR tablet Commonly known as: TYLENOL Take 1,300 mg by mouth 2 (two) times daily.   albuterol (2.5 MG/3ML) 0.083% nebulizer solution Commonly known as: PROVENTIL Take 3 mLs (2.5 mg total) by nebulization every 4 (four) hours as needed for wheezing or shortness of breath. Dx:J44.9 What changed: Another medication with the same name was changed. Make sure you understand how and when to take each.   albuterol 108 (90 Base) MCG/ACT inhaler Commonly known as: VENTOLIN HFA INHALE 2 PUFFS INTO THE LUNGS EVERY 6 (SIX) HOURS AS NEEDED FOR WHEEZING. What changed:   how much to take  how to take this  when to take this  reasons to take this  additional instructions   amLODipine 5 MG tablet Commonly known as: NORVASC TAKE 1 TABLET BY MOUTH ONCE DAILY   azelastine 0.1 % nasal spray Commonly known as: ASTELIN Place 2 sprays into both nostrils at bedtime as needed for rhinitis.   Biotin 5 MG Tabs Take 5 mg by mouth daily.   CALCIUM + D PO Take 1 tablet by mouth daily.   clopidogrel 75 MG tablet Commonly known as: PLAVIX TAKE 1 TABLET BY MOUTH ONCE A DAY   dextromethorphan-guaiFENesin 30-600 MG 12hr tablet Commonly known as: MUCINEX DM Take 1 tablet by mouth 2 (two) times daily.   diltiazem 240 MG 24 hr capsule Commonly known as: CARDIZEM CD Take 1 capsule (240 mg total) by mouth daily. Pt must keep appt in May to continue receiving refills     diltiazem 30 MG tablet Commonly known as: Cardizem Take one tablet by mouth every 4 hours AS NEEDED for A-fib HR > 100 as long as BP > 100. What changed:   how much to take  how to take this  when to take this  reasons to take this  additional instructions   ezetimibe 10 MG tablet Commonly known as: ZETIA TAKE 1 TABLET BY MOUTH DAILY What changed: when to take this   furosemide 20 MG tablet Commonly known as: LASIX Take 1 tablet (20 mg total) by mouth daily.   HYDROcodone-acetaminophen 10-325 MG tablet Commonly known as: NORCO Take 1 tablet by mouth every 6 (six) hours as needed for severe pain.   isosorbide mononitrate 60 MG 24 hr tablet Commonly known as: IMDUR TAKE 1 TABLET BY MOUTH TWICE A DAY   Krill Oil 500 MG Caps Take 500 mg by mouth daily.   levothyroxine 50 MCG tablet Commonly known as: SYNTHROID TAKE 1 TABLET BY MOUTH ONCE DAILY What changed: when to take this   nitroGLYCERIN 0.4 MG SL tablet Commonly known as: NITROSTAT Place 1 tablet (0.4 mg total) under the tongue every 5 (five) minutes as needed for chest pain.   omeprazole 20 MG capsule Commonly known as: PRILOSEC TAKE 1 CAPSULE BY MOUTH ONCE DAILY   predniSONE 20 MG tablet Commonly known as: DELTASONE Take 1 tablet (20 mg total) by mouth daily with breakfast.   rosuvastatin 40 MG tablet Commonly known as: CRESTOR TAKE 1 TABLET BY MOUTH ONCE A DAY What changed: when to take this   sertraline 100 MG tablet Commonly known as: ZOLOFT Take 1 tablet (100 mg total) by mouth daily.   Stiolto Respimat 2.5-2.5 MCG/ACT Aers Generic drug: Tiotropium Bromide-Olodaterol INHALE 2 PUFFS INTO THE LUNGS ONCE  DAILY What changed: See the new instructions.   SYSTANE OP Place 1 drop into both eyes 2 (two) times a day.   Vitamin D (Ergocalciferol) 1.25 MG (50000 UNIT) Caps capsule Commonly known as: DRISDOL Take 1 capsule (50,000 Units total) by mouth once a week.   Vitamin D3 50 MCG (2000 UT)  Tabs Take 2,000 Units by mouth daily.   Xarelto 15 MG Tabs tablet Generic drug: Rivaroxaban TAKE 1 TABLET BY MOUTH ONCE A DAY WITH SUPPER What changed: See the new instructions.   zinc gluconate 50 MG tablet Take 50 mg by mouth daily.       ROS:  Constitutional: No fevers or chills, no weight loss HEENT: No headaches, hearing loss, or runny nose, no sore throat Heart: No chest pain Lungs: No SOB, no cough Abd: No bowel changes, no pain, no N/V GU: No urinary complaints Neuro: No numbness, tingling or weakness Msk: No joint or muscle pain  Objective BP 118/72 (BP Location: Left Arm, Patient Position: Sitting, Cuff Size: Normal)   Pulse 98   Temp (!) 96.4 F (35.8 C) (Temporal)   Ht 5\' 2"  (1.575 m)   Wt 143 lb 8 oz (65.1 kg)   SpO2 97%   BMI 26.25 kg/m  General Appearance:  awake, alert, oriented, in no acute distress and well developed, well nourished Skin:  there are no suspicious lesions or rashes of concern Head/face:  NCAT Eyes:  EOMI, PERRLA Ears:  canals and TMs NI Nose/Sinuses:  negative Mouth/Throat:  Mucosa moist, no lesions; pharynx without erythema, edema or exudate. Neck:  neck- supple, no mass, non-tender and no jvd Lungs: Clear to auscultation.  No rales, rhonchi, or wheezing. Normal effort, no accessory muscle use. Heart:  Heart sounds are normal.  Regular rate and rhythm without murmur, gallop or rub. No bruits. Neurologic:  Alert and oriented x 3, gait normal., reflexes normal and symmetric, strength and  sensation grossly normal Psych exam: Nml mood and affect, age appropriate judgment and insight  Chronic obstructive pulmonary disease, unspecified COPD type ([place]) - Plan: Ambulatory referral to Pulmonology  Acute respiratory failure with hypoxia ([place]) - Plan: Ambulatory referral to Pulmonology  Coronary artery disease of native artery of native heart with stable angina pectoris ([place]) - Plan: Basic metabolic panel, CBC  Discharge summary and  medication list have been reviewed/reconciled.  Labs pending at the time of discharge have been reviewed or are still pending at the time of this visit.  Follow-up labs and appointments have been ordered and/or coordinated appropriately. We will follow up on labs. We will get her in with the pulmonology team.  She would like to see a different provider since her is recently retired. She does not need to wear oxygen at rest anymore.  We will get final clearance from the pulmonology team. Continue maintenance inhalers. She has a follow-up with the cardiology team in a few weeks.  TRANSITIONAL CARE MANAGEMENT CERTIFICATION:  I certify the following are true:   1. Communication with the patient/care giver was made within 2 business days of discharge.  2. Complexity of Medical decision making is moderate.  3. Face to face visit occurred within 14 days of discharge.   F/u as needed. The patient voiced understanding and agreement to the plan.  [place], DO 08/23/19 12:08 PM

## 2019-08-24 ENCOUNTER — Other Ambulatory Visit: Payer: Self-pay | Admitting: Family Medicine

## 2019-08-24 ENCOUNTER — Other Ambulatory Visit: Payer: Self-pay | Admitting: Cardiovascular Disease

## 2019-09-05 ENCOUNTER — Other Ambulatory Visit: Payer: Self-pay

## 2019-09-05 ENCOUNTER — Ambulatory Visit: Payer: Medicare HMO | Admitting: Pulmonary Disease

## 2019-09-05 ENCOUNTER — Encounter: Payer: Self-pay | Admitting: Pulmonary Disease

## 2019-09-05 VITALS — BP 102/60 | HR 94 | Temp 97.1°F | Ht 62.0 in | Wt 146.8 lb

## 2019-09-05 DIAGNOSIS — J449 Chronic obstructive pulmonary disease, unspecified: Secondary | ICD-10-CM

## 2019-09-05 DIAGNOSIS — I482 Chronic atrial fibrillation, unspecified: Secondary | ICD-10-CM

## 2019-09-05 DIAGNOSIS — G4733 Obstructive sleep apnea (adult) (pediatric): Secondary | ICD-10-CM

## 2019-09-05 MED ORDER — STIOLTO RESPIMAT 2.5-2.5 MCG/ACT IN AERS: 2.0000 | INHALATION_SPRAY | Freq: Every day | RESPIRATORY_TRACT | 0 refills | Status: DC

## 2019-09-05 NOTE — Assessment & Plan Note (Signed)
Plan: Continue CPAP therapy 

## 2019-09-05 NOTE — Patient Instructions (Addendum)
You were seen today by Lauraine Rinne, NP  for:   1. OSA (obstructive sleep apnea)  We recommend that you continue using your CPAP daily >>>Keep up the hard work using your device >>> Goal should be wearing this for the entire night that you are sleeping, at least 4 to 6 hours  Remember:  . Do not drive or operate heavy machinery if tired or drowsy.  . Please notify the supply company and office if you are unable to use your device regularly due to missing supplies or machine being broken.  . Work on maintaining a healthy weight and following your recommended nutrition plan  . Maintain proper daily exercise and movement  . Maintaining proper use of your device can also help improve management of other chronic illnesses such as: Blood pressure, blood sugars, and weight management.   BiPAP/ CPAP Cleaning:  >>>Clean weekly, with Dawn soap, and bottle brush.  Set up to air dry. >>> Wipe mask out daily with wet wipe or towelette   2. COPD GOLD II  Stiolto Respimat inhaler >>>2 puffs daily >>>Take this no matter what >>>This is not a rescue inhaler   I will discuss your medication cost with our pharmacy team to see if there is a more affordable option  Note your daily symptoms > remember "red flags" for COPD:   >>>Increase in cough >>>increase in sputum production >>>increase in shortness of breath or activity  intolerance.   If you notice these symptoms, please call the office to be seen.   3. Chronic Resp Failure   Walk today in office on room air, you completed 3 laps without any oxygen desaturations  Continue oxygen therapy as prescribed  >>>maintain oxygen saturations greater than 88 percent  >>>if unable to maintain oxygen saturations please contact the office  >>>do not smoke with oxygen  >>>can use nasal saline gel or nasal saline rinses to moisturize nose if oxygen causes dryness   4. AFIB   Keep follow up with cardiology    Follow Up:    Return in about 6 weeks  (around 10/17/2019), or if symptoms worsen or fail to improve, for Follow up with Dr. Lamonte Sakai - 46min slot, NEW PULMONOLOGIST IN 62min SLOT.   Please do your part to reduce the spread of COVID-19:      Reduce your risk of any infection  and COVID19 by using the similar precautions used for avoiding the common cold or flu:  Marland Kitchen Wash your hands often with soap and warm water for at least 20 seconds.  If soap and water are not readily available, use an alcohol-based hand sanitizer with at least 60% alcohol.  . If coughing or sneezing, cover your mouth and nose by coughing or sneezing into the elbow areas of your shirt or coat, into a tissue or into your sleeve (not your hands). Langley Gauss A MASK when in public  . Avoid shaking hands with others and consider head nods or verbal greetings only. . Avoid touching your eyes, nose, or mouth with unwashed hands.  . Avoid close contact with people who are sick. . Avoid places or events with large numbers of people in one location, like concerts or sporting events. . If you have some symptoms but not all symptoms, continue to monitor at home and seek medical attention if your symptoms worsen. . If you are having a medical emergency, call 911.   ADDITIONAL HEALTHCARE OPTIONS FOR PATIENTS  [place] [facility] / e-Visit: [email or web address]  [facility]: [facility]: [phone]                   [facility]: [phone]     It is flu season:   >>> Best ways to protect herself from the flu: Receive the yearly flu vaccine, practice good hand hygiene washing with soap and also using hand sanitizer when available, eat a nutritious meals, get adequate rest, hydrate appropriately   Please contact the office if your symptoms worsen or you have concerns that you are not improving.   Thank you for choosing [facility] for your healthcare, and  for allowing [place] to partner with you on your healthcare journey. I am thankful to be able to provide care to you today.   Wyn Quaker FNP-C

## 2019-09-05 NOTE — Progress Notes (Signed)
@Patient  ID: Andrea [name], female    DOB: Sep 01, 1940, 79 y.o.   MRN: [id number]  Chief Complaint  Patient presents with  . Follow-up    Patient states that she is feeling better since getting out of hospital. Patient is on 3 liters at home and 2 liters with portable O2. Denies cough, states wheezing is gone, has not used nebulizer feels like breathing is good.    Referring provider: Shelda Pal*  HPI:  79 year old female former smoker followed in our office for COPD and obstructive sleep apnea  PMH: Hyperlipidemia, depression, hypertension, GERD, mitral valve disease, vitamin D deficiency, cor pulmonale, A. fib Smoker/ Smoking History: Former smoker Maintenance: Stiolto Respimat Pt of: Dr. Mortimer Fries  09/05/2019  - Visit   79 year old female former smoker followed in our office for COPD, obstructive sleep apnea.  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  Patient was recently hospitalized in March/2021.  Excerpt from the discharge summary is listed below:  Admitted From: Home Disposition: Home  Recommendations for Outpatient Follow-up:  1. Follow up with PCP in 1-2 weeks 2. Follow-up with cardiologist in 4 weeks.  Home Health: None Equipment/Devices: Oxygen (3 L via nasal cannula on ambulation)  Discharge Condition: Fair CODE STATUS: DNR Diet recommendation: Heart Healthy with 2 g sodium.  Discharge Diagnoses:  Principal Problem:   COPD with acute exacerbation ([place]) Acute respiratory failure with hypoxemia (HCC)  Active Problems:   Atrial fibrillation with RVR, chronic (HCC)   OSA (obstructive sleep apnea)   Cor pulmonale, acute versus acute on chronic (HCC)   Acute on chronic diastolic CHF (congestive heart failure) (HCC)   Depression, recurrent (HCC)   HTN (hypertension)   Coronary artery disease involving coronary bypass graft of native heart with angina pectoris ([place])   PERIPHERAL VASCULAR DISEASE   Brief  narrative/HPI 79 year old female with history of COPD not on home O2, CAD s/p CABG and history of cardiac stents, AAA, carotid artery stenosis, major depressive disorder, hypertension, hypothyroidism, A. fib on Xarelto, pulmonary hypertension who presented with increased shortness of breath.  Admitted for COPD with acute exacerbation and acute on chronic diastolic CHF versus acute on chronic cor pulmonale.   Hospital course Acute respiratory failure with hypoxia (HCC) COPD with acute exacerbation Improved with IV Solu-Medrol, transition to oral prednisone.  Completed course of azithromycin.  Tested negative for COVID-19.  Symptoms improving on nebs. Does not smoke currently. Patient showing oxygen desaturation in the 80s on ambulation and will require home O2 with ambulation on discharge.  Clinically improved and will discharge on oral prednisone taper over the next 12 days, resume home albuterol inhaler, nebs and Stiolto..  Active problems Acute on chronic diastolic CHF Cor pulmonale, acute versus acute on chronic 2D echo with normal EF of 65% but showed moderately reduced RV function and pulmonary artery hypertension (pulmonary artery pressure of 51 mmHg).  Indeterminate diastolic function. Improved with IV Lasix and euvolemic.  Patient taking Lasix 20 mg every other day and will switch to once daily upon discharge. Continue beta-blocker upon discharge. Follow-up with her cardiologist Dr. Rockey Situ in 3-4 weeks.  Chronic A. fib with RVR Resume home dose Cardizem and Xarelto.  Cardiology consult appreciated and suspected rapid A. fib triggered by COPD exacerbation and CHF/cor pulmonale.  Rate currently well controlled as patient now euvolemic.  2D echo as above.  Continue Cardizem and Xarelto and outpatient follow-up with her cardiologist.  Obstructive sleep apnea Continue nighttime CPAP.  Coronary artery  disease Continue home dose Plavix, Imdur and statin.  Essential  hypertension Stable.  Continue amlodipine and Cardizem.  Continue daily Lasix.   Hypothyroidism Continue Synthroid  Disposition: Home Family communication: None Procedure: 2D echo  Consults: Cardiology   Patient presenting to office today reporting she is feeling better since being discharged from the hospital.  She continues to use her Stiolto Respimat.  Oxygen levels have been stable.  She did have a break in therapy while using her CPAP but this was when she was hospitalized.  CPAP compliance report today shows adequate compliance when you factor in her hospitalization.   Patient presenting today reporting that she would like to establish care in the [place] office with a new pulmonologist.  She reports that she is able to do her ADLs and IADLs at home.  Sometimes she gets short of breath.  She has follow-up with cardiology and known A. fib with RVR.  She is previously done 2 rounds of cardiopulmonary rehab she did not notice much improvement with her physical activity levels.  Questionaires / Pulmonary Flowsheets:   MMRC: mMRC Dyspnea Scale mMRC Score  09/05/2019 3    Epworth:  Results of the Epworth flowsheet 04/19/2018  Sitting and reading 2  Watching TV 3  Sitting, inactive in a public place (e.g. a theatre or a meeting) 0  As a passenger in a car for an hour without a break 1  Lying down to rest in the afternoon when circumstances permit 3  Sitting and talking to someone 0  Sitting quietly after a lunch without alcohol 3  In a car, while stopped for a few minutes in traffic 0  Total score 12    Tests:   PFTs 07/25/15: Mild-mod obstruction. FEV1 1.22 > 1.31 liters (65 > 69% pred) 0/26/11 CT chest: Moderate changes of emphysema predominantly affecting both upper lobes 02/26/15 echocardiogram: LVEF 60-65%.  Grade 1 diastolic dysfunction.  Mild-moderate MR.  LA is mildly dilated.  PA pressures estimated within normal range. 07/25/15 PFTs: FVC: 2.20 > 2.32 L (87 >  92 %pred), FEV1: 1.22 > 1.31 L (65 > 69 %pred), FEV1/FVC: 57%, TLC: 3.81 L (80 %pred), DLCO 79% %pred.  Gas dilution technique probably underestimating TLC 08/11/16 echocardiogram: LVEF 60-65%.  Grade 1 diastolic dysfunction.  Mild-moderate MR.  LA mildly dilated.  RV systolic pressure estimate 57 mmHg.  RA and RV both mildly dilated. 0/18/19 echocardiogram: LVEF 55-60%.  Mild-moderate MR.  LA mildly dilated.  RV systolic pressure estimate 49 mmHg.  Right atrium moderately-severely dilated. 01/18/2018 overnight oximetry: Lowest SPO2 81%.  Total time less than 88%: 213 minutes (out of 228 minutes total sleep time).  Nocturnal oxygen initiated on the basis of these findings 05/12/18 PSG: AHI 5.1/hr, Lowest SpO2 81%. Recommended AutoSet 5-15 cm H2O  FENO:  No results found for: NITRICOXIDE  PFT: PFT Results Latest Ref Rng & Units 07/25/2015 05/24/2013  FVC-Pre L 2.20 2.46  FVC-Predicted Pre % 87 94  FVC-Post L 2.32 2.54  FVC-Predicted Post % 92 97  Pre FEV1/FVC % % 56 54  Post FEV1/FCV % % 57 53  FEV1-Pre L 1.22 1.32  FEV1-Predicted Pre % 65 67  FEV1-Post L 1.31 1.35  DLCO UNC% % 79 54  DLCO COR %Predicted % 56 64  TLC L - 4.50  TLC % Predicted % - 94  RV % Predicted % - 93    WALK:  SIX MIN WALK 09/05/2019 03/19/2015  Supplimental Oxygen during Test? (L/min) No No  Tech Comments: Patient walked fast pace and maintained good O2 sats the entire time normal pace/minimal SOB//lmr    Imaging: DG Chest 2 View  Result Date: 08/12/2019 CLINICAL DATA:  Worsening shortness of breath and LEFT-sided chest pressure, fatigue, history coronary artery disease post stenting and bypass surgery, hypertension, COPD, former smoker EXAM: CHEST - 2 VIEW COMPARISON:  04/08/2019 FINDINGS: Enlargement of cardiac silhouette post median sternotomy. Mediastinal contours and pulmonary vascularity normal. Atherosclerotic calcification aorta. Emphysematous and bronchitic changes consistent with COPD. Biapical scarring  and chronic interstitial prominence stable. No acute infiltrate, pleural effusion, or pneumothorax. Bones demineralized. IMPRESSION: Enlargement of cardiac silhouette post median sternotomy. COPD changes with biapical scarring and chronic interstitial prominence. No acute abnormalities. Aortic Atherosclerosis (ICD10-I70.0) and Emphysema (ICD10-J43.9). Electronically Signed   By: Lavonia Dana M.D.   On: 08/12/2019 13:12   DG Chest Port 1 View  Result Date: 08/15/2019 CLINICAL DATA:  Shortness of breath. EXAM: PORTABLE CHEST 1 VIEW COMPARISON:  08/12/2019 FINDINGS: Persistent cardiomegaly. Pulmonary vascularity is normal. Lungs are clear. CABG. Coronary artery stent in place. Aortic atherosclerosis. No acute bone abnormality. IMPRESSION: No acute abnormality.  Unchanged cardiomegaly. Electronically Signed   By: Lorriane Shire M.D.   On: 08/15/2019 10:31   ECHOCARDIOGRAM COMPLETE  Result Date: 08/17/2019    ECHOCARDIOGRAM REPORT   Patient Name:   BRANIYAH BESSE Date of Exam: 08/16/2019 Medical Rec #:  [id number]     Height:       62.0 in Accession #:    [id number]    Weight:       150.0 lb Date of Birth:  1940/06/26    BSA:          1.692 m Patient Age:    37 years      BP:           144/96 mmHg Patient Gender: F             HR:           79 bpm. Exam Location:  [facility] Procedure: 2D Echo, Cardiac Doppler and Color Doppler Indications:     R06.00 Dyspnea  History:         Patient has prior history of Echocardiogram examinations, most                  recent 12/10/2017. Risk Factors:Hypertension and Dyslipidemia.                  Thyroid disease. Shortness of breath. Peripheral vascular                  disease. Pneumonia. NSTEMI. Murmur. COPD. Coronary artery                  disease. Bradycardia. AAA.  Sonographer:     Wilford Sports Rodgers-Jones Referring Phys:  [id number] [place] Diagnosing Phys: Nelva Bush MD IMPRESSIONS  1. Left ventricular ejection fraction, by estimation, is 60 to 65%. The left ventricle has  normal function. The left ventricle has no regional wall motion abnormalities. Left ventricular diastolic parameters are indeterminate.  2. Right ventricular systolic function is moderately reduced. The right ventricular size is moderately enlarged. moderately increased right ventricular wall thickness. There is moderately elevated pulmonary artery systolic pressure. The estimated right ventricular systolic pressure is 48.5 mmHg.  3. Left atrial size was moderately dilated.  4. Right atrial size was moderately dilated.  5. The mitral valve is degenerative. Mild to moderate mitral valve regurgitation.  6. Tricuspid valve  regurgitation is severe.  7. The aortic valve is tricuspid. Aortic valve regurgitation is not visualized. Mild to moderate aortic valve sclerosis/calcification is present, without any evidence of aortic stenosis.  8. The inferior vena cava is dilated in size with <50% respiratory variability, suggesting right atrial pressure of 15 mmHg. FINDINGS  Left Ventricle: Left ventricular ejection fraction, by estimation, is 60 to 65%. The left ventricle has normal function. The left ventricle has no regional wall motion abnormalities. The left ventricular internal cavity size was normal in size. There is  no left ventricular hypertrophy. Left ventricular diastolic parameters are indeterminate. Right Ventricle: The right ventricular size is moderately enlarged. Moderately increased right ventricular wall thickness. Right ventricular systolic function is moderately reduced. There is moderately elevated pulmonary artery systolic pressure. The tricuspid regurgitant velocity is 3.03 m/s, and with an assumed right atrial pressure of 15 mmHg, the estimated right ventricular systolic pressure is 83.1 mmHg. Left Atrium: Left atrial size was moderately dilated. Right Atrium: Right atrial size was moderately dilated. Pericardium: There is no evidence of pericardial effusion. Mitral Valve: The mitral valve is  degenerative in appearance. There is mild thickening of the mitral valve leaflet(s). Mild to moderate mitral valve regurgitation. Tricuspid Valve: The tricuspid valve is normal in structure. Tricuspid valve regurgitation is severe. The flow in the hepatic veins is reversed during ventricular systole. Aortic Valve: The aortic valve is tricuspid. . There is mild thickening and mild calcification of the aortic valve. Aortic valve regurgitation is not visualized. Mild to moderate aortic valve sclerosis/calcification is present, without any evidence of aortic stenosis. There is mild thickening of the aortic valve. There is mild calcification of the aortic valve. Pulmonic Valve: The pulmonic valve was not well visualized. Pulmonic valve regurgitation is not visualized. No evidence of pulmonic stenosis. There is no evidence of pulmonic valve vegetation. Aorta: The aortic root is normal in size and structure. Pulmonary Artery: The pulmonary artery is not well seen. Venous: The inferior vena cava is dilated in size with less than 50% respiratory variability, suggesting right atrial pressure of 15 mmHg. IAS/Shunts: No atrial level shunt detected by color flow Doppler.  LEFT VENTRICLE PLAX 2D LVIDd:         5.07 cm Diastology LVIDs:         3.25 cm LV e' lateral:   12.60 cm/s LV PW:         0.89 cm LV E/e' lateral: 11.7 LV IVS:        0.79 cm LV e' medial:    8.78 cm/s                        LV E/e' medial:  16.9  RIGHT VENTRICLE RV Basal diam:  4.08 cm RV S prime:     8.79 cm/s TAPSE (M-mode): 1.4 cm LEFT ATRIUM             Index       RIGHT ATRIUM           Index LA diam:        5.90 cm 3.49 cm/m  RA Area:     25.90 cm LA Vol (A2C):   86.7 ml 51.25 ml/m RA Volume:   94.50 ml  55.86 ml/m LA Vol (A4C):   79.2 ml 46.82 ml/m LA Biplane Vol: 83.4 ml 49.30 ml/m   AORTA Ao Root diam: 2.60 cm MV E velocity: 148.00 cm/s  TRICUSPID VALVE  TR Peak grad:   36.7 mmHg                             TR Vmax:         303.00 cm/s Nelva Bush MD Electronically signed by Nelva Bush MD Signature Date/Time: 08/17/2019/8:00:09 AM    Final     Lab Results:  CBC    Component Value Date/Time   WBC 13.6 (H) 08/23/2019 1151   RBC 5.45 (H) 08/23/2019 1151   HGB 13.7 08/23/2019 1151   HGB 12.5 03/09/2018 1520   HCT 43.0 08/23/2019 1151   HCT 38.1 03/09/2018 1520   PLT 252.0 08/23/2019 1151   PLT 182 03/09/2018 1520   MCV 78.9 08/23/2019 1151   MCV 82 03/09/2018 1520   MCH 25.6 (L) 08/15/2019 0559   MCHC 31.9 08/23/2019 1151   RDW 16.7 (H) 08/23/2019 1151   RDW 14.8 03/09/2018 1520   LYMPHSABS 1.6 06/13/2019 1425   LYMPHSABS 1.9 06/17/2016 1436   MONOABS 0.6 06/13/2019 1425   EOSABS 0.2 06/13/2019 1425   EOSABS 0.2 06/17/2016 1436   BASOSABS 0.1 06/13/2019 1425   BASOSABS 0.0 06/17/2016 1436    BMET    Component Value Date/Time   NA 139 08/23/2019 1151   NA 145 (H) 03/09/2018 1520   K 3.8 08/23/2019 1151   CL 98 08/23/2019 1151   CO2 34 (H) 08/23/2019 1151   GLUCOSE 98 08/23/2019 1151   GLUCOSE 88 06/03/2006 1539   BUN 17 08/23/2019 1151   BUN 15 03/09/2018 1520   CREATININE 0.98 08/23/2019 1151   CREATININE 0.87 07/26/2013 1440   CALCIUM 9.2 08/23/2019 1151   GFRNONAA 55 (L) 08/17/2019 0444   GFRAA >60 08/17/2019 0444    BNP    Component Value Date/Time   BNP 486.0 (H) 08/16/2019 0616    ProBNP    Component Value Date/Time   PROBNP 567 03/22/2019 1131    Specialty Problems      Pulmonary Problems   COPD GOLD II    Followed in Pulmonary clinic/ [facility]/ [place] - PFT's 05/24/2013  FEV1 1.32 (67%) ratio 54 and no change p B2 but used symbicort am of test, DLCO 54 corrects to 64  - 03/19/2015  extensive coaching HFA effectiveness =    90% from a baseline of 50%       Cough    Qualifier: Diagnosis of  By: Haroldine Laws, MD, Eileen Stanford       SINUSITIS, CHRONIC    Qualifier: Diagnosis of  By: Wynona Luna       Dyspnea   Upper airway cough  syndrome   Chronic ethmoidal sinusitis   Witnessed episode of apnea   COPD with acute exacerbation ([place])   Hypoxia   OSA (obstructive sleep apnea)   COPD (chronic obstructive pulmonary disease) (HCC)      Allergies  Allergen Reactions  . Erythromycin Other (See Comments)    Tongue burns  . Meperidine Nausea And Vomiting  . Shellfish Allergy Nausea And Vomiting  . Ciprofloxacin Rash and Other (See Comments)    Rash from IV  . Codeine Nausea And Vomiting  . Penicillins Rash and Other (See Comments)    Has patient had a PCN reaction causing immediate rash, facial/tongue/throat swelling, SOB or lightheadedness with hypotension:unsure Has patient had a PCN reaction causing severe rash involving mucus membranes or skin necrosis:No Has patient had a PCN reaction that required  hospitalization:No Has patient had a PCN reaction occurring within the last 10 years:No If all of the above answers are "NO", then may proceed with Cephalosporin use.   . Tape Rash    pls use paper tape    Immunization History  Administered Date(s) Administered  . Fluad Quad(high Dose 65+) 04/07/2019  . Influenza,inj,Quad PF,6+ Mos 04/15/2016  . PFIZER SARS-COV-2 Vaccination 07/20/2019, 08/10/2019  . Pneumococcal Conjugate-13 01/24/2014  . Pneumococcal Polysaccharide-23 05/25/2002, 12/01/2017  . Td 05/24/1999  . Tdap 01/24/2014    Past Medical History:  Diagnosis Date  . AAA (abdominal aortic aneurysm) ([place]) 01/2009   AAA (2.8 x 3.0)  moderate RAS (left); 2.7 x 2.7 cm (07/11/05)  . Acute bronchitis 03/20/2013; 2017  . Angina   . Anosmia   . Asthma   . Baker's cyst of knee 01/30/2014  . Basal cell carcinoma    "back and left arm"  . Bradycardia    Metoprolol stopped 08/2011  . CAD (coronary artery disease)    s/CABG (reports IMA and 2 SVGs) back in 1999  Myoview normal 3/10; s/p PCI with DES to PL branch of distal RCA 09/2011; PCI +DES to SVG-RCA, PCI + DES to mid LCx 12/2015  . Cerebrovascular disease  01/2009   carotid u/s  R 0-39%   L 60-79%  . Chronic thoracic back pain   . COPD (chronic obstructive pulmonary disease) ([place])   . Depression   . Dizziness   . Dysrhythmia    hx of sinus brady  . GERD (gastroesophageal reflux disease)   . Heart murmur   . Herniated lumbar disc without myelopathy   . History of blood transfusion 1999   "when I had the bypass; had a PE"  . Hyperglycemia 10/23/2015  . Hyperlipidemia   . Hypertension   . Hypothyroidism 09/30/2016  . Medicare annual wellness visit, subsequent 07/31/2013  . NSTEMI (non-ST elevated myocardial infarction) ([place]) 11/12   Cath showed atretic IMA graft to the LAD, SVG to PD was patent but the continuation of this graft to the PL branch was occluded; there were L to R collaterals; Mid LAD had a 60 to 70% stenosis. She has been treated medically.  Neg Myoview 05/2011  . Osteoarthritis of back   . Osteoporosis   . Pneumonia "several times"  . Pulmonary embolism ([place]) 1999   "after my bypass"  . PVD (peripheral vascular disease) ([place])   . Shortness of breath   . Squamous carcinoma    "nose"  . Thyroid disease    Hypothyroid  . Unstable angina ([place]) 09/25/2015    Tobacco History: Social History   Tobacco Use  Smoking Status Former Smoker  . Packs/day: 0.50  . Years: 40.00  . Pack years: 20.00  . Types: Cigarettes  . Quit date: 01/19/1993  . Years since quitting: 26.6  Smokeless Tobacco Never Used   Counseling given: Yes   Continue to not smoke  Outpatient Encounter Medications as of 09/05/2019  Medication Sig  . acetaminophen (TYLENOL) 650 MG CR tablet Take 1,300 mg by mouth 2 (two) times daily.  Marland Kitchen albuterol (PROVENTIL) (2.5 MG/3ML) 0.083% nebulizer solution Take 3 mLs (2.5 mg total) by nebulization every 4 (four) hours as needed for wheezing or shortness of breath. Dx:J44.9  . albuterol (VENTOLIN HFA) 108 (90 Base) MCG/ACT inhaler INHALE 2 PUFFS INTO THE LUNGS EVERY 6 (SIX) HOURS AS NEEDED FOR WHEEZING. (Patient taking  differently: Inhale 2 puffs into the lungs every 6 (six) hours as needed for  wheezing or shortness of breath. )  . amLODipine (NORVASC) 5 MG tablet TAKE 1 TABLET BY MOUTH ONCE DAILY (Patient taking differently: Take 5 mg by mouth daily. )  . azelastine (ASTELIN) 0.1 % nasal spray Place 2 sprays into both nostrils at bedtime as needed for rhinitis.  . Biotin 5 MG TABS Take 5 mg by mouth daily.   . Calcium Carbonate-Vitamin D (CALCIUM + D PO) Take 1 tablet by mouth daily.   . Cholecalciferol (VITAMIN D3) 2000 units TABS Take 2,000 Units by mouth daily.   . clopidogrel (PLAVIX) 75 MG tablet TAKE 1 TABLET BY MOUTH ONCE A DAY (Patient taking differently: Take 75 mg by mouth daily. )  . dextromethorphan-guaiFENesin (MUCINEX DM) 30-600 MG 12hr tablet Take 1 tablet by mouth 2 (two) times daily.  Marland Kitchen diltiazem (CARDIZEM CD) 240 MG 24 hr capsule Take 1 capsule (240 mg total) by mouth daily. Pt must keep appt in May to continue receiving refills  . diltiazem (CARDIZEM) 30 MG tablet Take one tablet by mouth every 4 hours AS NEEDED for A-fib HR > 100 as long as BP > 100. (Patient taking differently: Take 30 mg by mouth every 4 (four) hours as needed (aFib [HR >100 and if BP >100]). )  . ezetimibe (ZETIA) 10 MG tablet TAKE 1 TABLET BY MOUTH DAILY (Patient taking differently: Take 10 mg by mouth at bedtime. )  . furosemide (LASIX) 20 MG tablet Take 1 tablet (20 mg total) by mouth daily.  Marland Kitchen HYDROcodone-acetaminophen (NORCO) 10-325 MG tablet Take 1 tablet by mouth every 6 (six) hours as needed for severe pain.  . isosorbide mononitrate (IMDUR) 60 MG 24 hr tablet TAKE 1 TABLET BY MOUTH TWICE A DAY (Patient taking differently: Take 60 mg by mouth 2 (two) times daily. )  . Krill Oil 500 MG CAPS Take 500 mg by mouth daily.   Marland Kitchen levothyroxine (SYNTHROID) 50 MCG tablet TAKE 1 TABLET BY MOUTH ONCE DAILY (Patient taking differently: Take 50 mcg by mouth daily before breakfast. )  . nitroGLYCERIN (NITROSTAT) 0.4 MG SL tablet  Place 1 tablet (0.4 mg total) under the tongue every 5 (five) minutes as needed for chest pain.  Marland Kitchen omeprazole (PRILOSEC) 20 MG capsule TAKE 1 CAPSULE BY MOUTH ONCE DAILY  . Polyethyl Glycol-Propyl Glycol (SYSTANE OP) Place 1 drop into both eyes 2 (two) times a day.   . rosuvastatin (CRESTOR) 40 MG tablet TAKE 1 TABLET BY MOUTH ONCE DAILY  . sertraline (ZOLOFT) 100 MG tablet Take 1 tablet (100 mg total) by mouth daily.  Marland Kitchen STIOLTO RESPIMAT 2.5-2.5 MCG/ACT AERS INHALE 2 PUFFS INTO THE LUNGS ONCE DAILY (Patient taking differently: Inhale 2 puffs into the lungs daily. )  . Vitamin D, Ergocalciferol, (DRISDOL) 1.25 MG (50000 UNIT) CAPS capsule Take 1 capsule (50,000 Units total) by mouth once a week.  Alveda Reasons 15 MG TABS tablet TAKE 1 TABLET BY MOUTH ONCE A DAY WITH SUPPER (Patient taking differently: Take 15 mg by mouth at bedtime. )  . zinc gluconate 50 MG tablet Take 50 mg by mouth daily.  . Tiotropium Bromide-Olodaterol (STIOLTO RESPIMAT) 2.5-2.5 MCG/ACT AERS Inhale 2 puffs into the lungs daily.  . [DISCONTINUED] predniSONE (DELTASONE) 20 MG tablet Take 1 tablet (20 mg total) by mouth daily with breakfast.   No facility-administered encounter medications on file as of 09/05/2019.     Review of Systems  Review of Systems  Constitutional: Negative for activity change, fatigue and fever.  HENT: Negative for sinus pressure,  sinus pain and sore throat.   Respiratory: Positive for shortness of breath. Negative for cough and wheezing.   Cardiovascular: Negative for chest pain, palpitations and leg swelling.  Gastrointestinal: Negative for diarrhea, nausea and vomiting.  Musculoskeletal: Negative for arthralgias.  Neurological: Negative for dizziness.  Psychiatric/Behavioral: Negative for sleep disturbance. The patient is not nervous/anxious.      Physical Exam  BP 102/60 (BP Location: Left Arm, Patient Position: Sitting, Cuff Size: Normal)   Pulse 94   Temp (!) 97.1 F (36.2 C) (Temporal)    Ht 5\' 2"  (1.575 m)   Wt 146 lb 12.8 oz (66.6 kg)   SpO2 98%   BMI 26.85 kg/m   Wt Readings from Last 5 Encounters:  09/05/19 146 lb 12.8 oz (66.6 kg)  08/23/19 143 lb 8 oz (65.1 kg)  08/12/19 150 lb (68 kg)  06/13/19 150 lb 12.8 oz (68.4 kg)  04/12/19 150 lb 8 oz (68.3 kg)    BMI Readings from Last 5 Encounters:  09/05/19 26.85 kg/m  08/23/19 26.25 kg/m  08/12/19 27.44 kg/m  06/13/19 27.58 kg/m  04/12/19 27.53 kg/m     Physical Exam Vitals and nursing note reviewed.  Constitutional:      General: She is not in acute distress.    Appearance: Normal appearance. She is normal weight.  HENT:     Head: Normocephalic and atraumatic.     Right Ear: Tympanic membrane, ear canal and external ear normal. There is no impacted cerumen.     Left Ear: Tympanic membrane, ear canal and external ear normal. There is no impacted cerumen.     Nose: Nose normal. No congestion or rhinorrhea.     Mouth/Throat:     Mouth: Mucous membranes are moist.     Pharynx: Oropharynx is clear.  Eyes:     Pupils: Pupils are equal, round, and reactive to light.  Cardiovascular:     Rate and Rhythm: Normal rate. Rhythm regularly irregular.     Pulses: Normal pulses.     Heart sounds: Normal heart sounds. No murmur.  Pulmonary:     Breath sounds: No decreased air movement. No decreased breath sounds, wheezing or rales.     Comments: Diminished breath sounds throughout exam Musculoskeletal:     Cervical back: Normal range of motion.  Skin:    General: Skin is warm and dry.     Capillary Refill: Capillary refill takes less than 2 seconds.  Neurological:     General: No focal deficit present.     Mental Status: She is alert and oriented to person, place, and time. Mental status is at baseline.     Gait: Gait (Tolerated walk in office) normal.  Psychiatric:        Mood and Affect: Mood normal.        Behavior: Behavior normal.        Thought Content: Thought content normal.        Judgment:  Judgment normal.       Assessment & Plan:   COPD GOLD II Plan: Continue Stiolto Respimat I will discuss with our clinical pharmacy team to see if there is a better LAMA/LABA combination that may be more affordable for the patient We will establish patient with Dr. Lamonte Sakai in our [place] office at her request Continue oxygen therapy as needed to maintain oxygen saturations above 90% Walk today in office, patient completed 3 laps without any oxygen desats   OSA (obstructive sleep apnea) Plan: Continue CPAP therapy  Atrial fibrillation, chronic (HCC) Plan: Continue follow-up with cardiology    Return in about 6 weeks (around 10/17/2019), or if symptoms worsen or fail to improve, for Follow up with Dr. Lamonte Sakai - 54min slot, NEW PULMONOLOGIST IN 34min SLOT.   Lauraine Rinne, NP 09/05/2019   This appointment required 32 minutes of patient care (this includes precharting, chart review, review of results, face-to-face care, etc.).

## 2019-09-05 NOTE — Assessment & Plan Note (Signed)
Plan: Continue follow-up with cardiology 

## 2019-09-05 NOTE — Assessment & Plan Note (Signed)
Plan: Continue Stiolto Respimat I will discuss with our clinical pharmacy team to see if there is a better LAMA/LABA combination that may be more affordable for the patient We will establish patient with Dr. Lamonte Sakai in our [place] office at her request Continue oxygen therapy as needed to maintain oxygen saturations above 90% Walk today in office, patient completed 3 laps without any oxygen desats

## 2019-09-15 ENCOUNTER — Other Ambulatory Visit: Payer: Self-pay

## 2019-09-15 MED ORDER — FUROSEMIDE 20 MG PO TABS: 20.0000 mg | ORAL_TABLET | Freq: Every day | ORAL | 3 refills | Status: DC

## 2019-09-21 DIAGNOSIS — D2262 Melanocytic nevi of left upper limb, including shoulder: Secondary | ICD-10-CM | POA: Diagnosis not present

## 2019-09-21 DIAGNOSIS — C44519 Basal cell carcinoma of skin of other part of trunk: Secondary | ICD-10-CM | POA: Diagnosis not present

## 2019-09-21 DIAGNOSIS — D485 Neoplasm of uncertain behavior of skin: Secondary | ICD-10-CM | POA: Diagnosis not present

## 2019-09-21 DIAGNOSIS — L821 Other seborrheic keratosis: Secondary | ICD-10-CM | POA: Diagnosis not present

## 2019-09-21 DIAGNOSIS — D2261 Melanocytic nevi of right upper limb, including shoulder: Secondary | ICD-10-CM | POA: Diagnosis not present

## 2019-09-21 DIAGNOSIS — X32XXXA Exposure to sunlight, initial encounter: Secondary | ICD-10-CM | POA: Diagnosis not present

## 2019-09-21 DIAGNOSIS — D225 Melanocytic nevi of trunk: Secondary | ICD-10-CM | POA: Diagnosis not present

## 2019-09-21 DIAGNOSIS — L570 Actinic keratosis: Secondary | ICD-10-CM | POA: Diagnosis not present

## 2019-09-21 DIAGNOSIS — Z85828 Personal history of other malignant neoplasm of skin: Secondary | ICD-10-CM | POA: Diagnosis not present

## 2019-09-21 DIAGNOSIS — D2271 Melanocytic nevi of right lower limb, including hip: Secondary | ICD-10-CM | POA: Diagnosis not present

## 2019-09-21 DIAGNOSIS — L905 Scar conditions and fibrosis of skin: Secondary | ICD-10-CM | POA: Diagnosis not present

## 2019-09-22 ENCOUNTER — Encounter: Payer: Self-pay | Admitting: Cardiology

## 2019-09-22 ENCOUNTER — Other Ambulatory Visit: Payer: Self-pay

## 2019-09-22 ENCOUNTER — Ambulatory Visit: Payer: Medicare HMO | Admitting: Cardiology

## 2019-09-22 VITALS — BP 128/70 | HR 99 | Ht 60.0 in | Wt 149.0 lb

## 2019-09-22 DIAGNOSIS — Z01812 Encounter for preprocedural laboratory examination: Secondary | ICD-10-CM

## 2019-09-22 DIAGNOSIS — I4819 Other persistent atrial fibrillation: Secondary | ICD-10-CM | POA: Diagnosis not present

## 2019-09-22 DIAGNOSIS — J449 Chronic obstructive pulmonary disease, unspecified: Secondary | ICD-10-CM | POA: Diagnosis not present

## 2019-09-22 LAB — BASIC METABOLIC PANEL
BUN/Creatinine Ratio: 9 — ABNORMAL LOW (ref 12–28)
BUN: 8 mg/dL (ref 8–27)
CO2: 27 mmol/L (ref 20–29)
Calcium: 9.6 mg/dL (ref 8.7–10.3)
Chloride: 103 mmol/L (ref 96–106)
Creatinine, Ser: 0.89 mg/dL (ref 0.57–1.00)
GFR calc Af Amer: 71 mL/min/{1.73_m2} (ref >59)
GFR calc non Af Amer: 61 mL/min/{1.73_m2} (ref >59)
Glucose: 95 mg/dL (ref 65–99)
Potassium: 3.9 mmol/L (ref 3.5–5.2)
Sodium: 145 mmol/L — ABNORMAL HIGH (ref 134–144)

## 2019-09-22 LAB — CBC
Hematocrit: 41.2 % (ref 34.0–46.6)
Hemoglobin: 13.3 g/dL (ref 11.1–15.9)
MCH: 25.5 pg — ABNORMAL LOW (ref 26.6–33.0)
MCHC: 32.3 g/dL (ref 31.5–35.7)
MCV: 79 fL (ref 79–97)
Platelets: 230 10*3/uL (ref 150–450)
RBC: 5.21 x10E6/uL (ref 3.77–5.28)
RDW: 16.3 % — ABNORMAL HIGH (ref 11.7–15.4)
WBC: 7.9 10*3/uL (ref 3.4–10.8)

## 2019-09-22 NOTE — Patient Instructions (Addendum)
Medication Instructions:  Your physician recommends that you continue on your current medications as directed. Please refer to the Current Medication list given to you today.  *If you need a refill on your cardiac medications before your next appointment, please call your pharmacy*   Lab Work: Pre procedure labs today: BMET & CBC If you have labs (blood work) drawn today and your tests are completely normal, you will receive your results only by: Marland Kitchen MyChart Message (if you have MyChart) OR . A paper copy in the mail If you have any lab test that is abnormal or we need to change your treatment, we will call you to review the results.   Testing/Procedures: Your physician has recommended that you have a Cardioversion (DCCV). Electrical Cardioversion uses a jolt of electricity to your heart either through paddles or wired patches attached to your chest. This is a controlled, usually prescheduled, procedure. Defibrillation is done under light anesthesia in the hospital, and you usually go home the day of the procedure. This is done to get your heart back into a normal rhythm. You are not awake for the procedure. Please see the instructions below located under "other instructions".     Follow-Up: Your physician recommends that you schedule a follow-up appointment in: 2 weeks, after your cardioversion, with the AFib clinic.  At [facility], you and your health needs are our priority.  As part of our continuing mission to provide you with exceptional heart care, we have created designated Provider Care Teams.  These Care Teams include your primary Cardiologist (physician) and Advanced Practice Providers (APPs -  Physician Assistants and Nurse Practitioners) who all work together to provide you with the care you need, when you need it.  We recommend signing up for the patient portal called "MyChart".  Sign up information is provided on this After Visit Summary.  MyChart is used to connect with  patients for Virtual Visits (Telemedicine).  Patients are able to view lab/test results, encounter notes, upcoming appointments, etc.  Non-urgent messages can be sent to your provider as well.   To learn more about what you can do with MyChart, go to [email or web address].    Your next appointment:   3 month(s)  The format for your next appointment:   In Person  Provider:   Allegra Lai, MD   Thank you for choosing [facility]!!   Trinidad Curet, RN [phone]    Other Instructions              COVID TEST-- On 09/26/19 @ 12:05 pm - You will go to [facility]. Stay in your car and the nurse team will come to your car to test you.  After you are tested please go home and self quarantine until the day of your procedure.     Your provider has recommended a cardioversion.   You are scheduled for a cardioversion on 09/28/19  with Dr. Acie Fredrickson or associates. Please [facility] to [facility] at 8:00 am.  Enter through the [place] not have any food or drink after midnight the night before procedure. Hold Lasix the morning of this procedure. You may take your remaining medicines with a sip of water on the day of your procedure.  You will need someone to drive you home following your procedure.   Call the [place] office at [phone] if you have any questions, problems or concerns.  Electrical Cardioversion Electrical cardioversion is the delivery of a jolt of electricity to change the rhythm of the heart. Sticky patches or metal paddles are placed on the chest to deliver the electricity from a device. This is done to restore a normal rhythm. A rhythm that is too fast or not regular keeps the heart from pumping well. Electrical cardioversion is done in an emergency if:   There is low or no blood pressure as a result of the heart rhythm.    Normal rhythm must be restored as fast as possible to protect the  brain and heart from further damage.    It may save a life. Cardioversion may be done for heart rhythms that are not immediately life threatening, such as atrial fibrillation or flutter, in which:   The heart is beating too fast or is not regular.    Medicine to change the rhythm has not worked.    It is safe to wait in order to allow time for preparation.  Symptoms of the abnormal rhythm are bothersome.  The risk of stroke and other serious problems can be reduced.  LET [facility] CARE PROVIDER KNOW ABOUT:   Any allergies you have.  All medicines you are taking, including vitamins, herbs, eye drops, creams, and over-the-counter medicines.  Previous problems you or members of your family have had with the use of anesthetics.    Any blood disorders you have.    Previous surgeries you have had.    Medical conditions you have.   RISKS AND COMPLICATIONS  Generally, this is a safe procedure. However, problems can occur and include:   Breathing problems related to the anesthetic used.  A blood clot that breaks free and travels to other parts of your body. This could cause a stroke or other problems. The risk of this is lowered by use of blood-thinning medicine (anticoagulant) prior to the procedure.  Cardiac arrest (rare).   BEFORE THE PROCEDURE   You may have tests to detect blood clots in your heart and to evaluate heart function.   You may start taking anticoagulants so your blood does not clot as easily.    Medicines may be given to help stabilize your heart rate and rhythm.   PROCEDURE  You will be given medicine through an IV tube to reduce discomfort and make you sleepy (sedative).    An electrical shock will be delivered.   AFTER THE PROCEDURE Your heart rhythm will be watched to make sure it does not change. You will need someone to drive you home.      Preparing for Tikosyn (Dofetilide) Admission  1) Check with insurance company for cost of drug to  ensure affordability.  Dofetilide 500 mcg twice a day  2) Tikosyn admission requires a 3 day hospital stay with constant telemetry monitoring. You will have and EKG after each dose of Tikosyn.  If the drug does not convert you to normal rhythm a cardioversion will be performed prior to discharge.  3) A pharmacist will review all of your medications.  If there are any interactions noted we will be in touch with you.  4) Please ensure no missed doses of your anticoagulation (blood thinner) for the past 3 weeks prior to admission.  5) No Benadryl is allowed 3 days prior to admission.  6) On day of admission - you will come in for office visit in the [facility] where lab work will be drawn.  IF the labs are acceptable an inpatient  bed will be requested.  7) Normally admission to a bed is straight from our office.  This is all dependent on bed availability.  In some instances you may be sent home and bed placement will call later in the day when a bed is available.  8) You may bring personal belongings/clothing with you to the hospital.  Please leave your suitcase in the care until you arrive in admissions.  Questions - please call the office at [phone]

## 2019-09-22 NOTE — Progress Notes (Signed)
Electrophysiology Office Note   Date:  09/22/2019   ID:  Andrea [name], DOB 09-23-1940, MRN [id number]  PCP:  Mosie Lukes, MD  Cardiologist:  Rockey Situ Primary Electrophysiologist:  Kha Hari Meredith Leeds, MD    No chief complaint on file.    History of Present Illness: Andrea [name] is a 79 y.o. female who is being seen today for the evaluation of SVT at the request of Christell Faith. Presenting today for electrophysiology evaluation.  She has a history of coronary disease status post CABG in 1999 status post PCI to the distal RCA in 2013, PCI x2 to the SVG to the RCA into the mid circumflex in 2017, PVD status post lower extremity stenting, carotid artery disease status post left-sided CEA, pulmonary hypertension, valvular heart disease, COPD, infrarenal AAA, PE, hypertension, hyperlipidemia, asthma, hypothyroidism.  Her a Holter monitor with episodes of SVT/atrial tachycardia, longest lasting 14 beats with frequent PACs and rare PVCs.  She has palpitations on a daily basis.  They last between 5 and 10 minutes at times.  There are no exacerbating or alleviating factors.  Had an EP study on 03/19/2018 which did not induce tachycardia.    Today, denies symptoms of palpitations, chest pain, orthopnea, PND, lower extremity edema, claudication, dizziness, presyncope, syncope, bleeding, or neurologic sequela. The patient is tolerating medications without difficulties.  She presented to [facility] with a COPD exacerbation.  At the time she was found to be in atrial fibrillation.  She had an echo performed that showed a normal ejection fraction and a moderately dilated left atrium.  She was planned for a rate control strategy as it was thought that her COPD exacerbation remake rhythm control quite difficult.  She presents today with shortness of breath and fatigue.  She feels that it is potentially due to her COPD, though does not clear whether or not atrial fibrillation is contributing.   Past  Medical History:  Diagnosis Date  . AAA (abdominal aortic aneurysm) ([place]) 01/2009   AAA (2.8 x 3.0)  moderate RAS (left); 2.7 x 2.7 cm (07/11/05)  . Acute bronchitis 03/20/2013; 2017  . Angina   . Anosmia   . Asthma   . Baker's cyst of knee 01/30/2014  . Basal cell carcinoma    "back and left arm"  . Bradycardia    Metoprolol stopped 08/2011  . CAD (coronary artery disease)    s/CABG (reports IMA and 2 SVGs) back in 1999  Myoview normal 3/10; s/p PCI with DES to PL branch of distal RCA 09/2011; PCI +DES to SVG-RCA, PCI + DES to mid LCx 12/2015  . Cerebrovascular disease 01/2009   carotid u/s  R 0-39%   L 60-79%  . Chronic thoracic back pain   . COPD (chronic obstructive pulmonary disease) ([place])   . Depression   . Dizziness   . Dysrhythmia    hx of sinus brady  . GERD (gastroesophageal reflux disease)   . Heart murmur   . Herniated lumbar disc without myelopathy   . History of blood transfusion 1999   "when I had the bypass; had a PE"  . Hyperglycemia 10/23/2015  . Hyperlipidemia   . Hypertension   . Hypothyroidism 09/30/2016  . Medicare annual wellness visit, subsequent 07/31/2013  . NSTEMI (non-ST elevated myocardial infarction) ([place]) 11/12   Cath showed atretic IMA graft to the LAD, SVG to PD was patent but the continuation of this graft to the PL branch was occluded; there were L to R collaterals; Mid  LAD had a 60 to 70% stenosis. She has been treated medically.  Neg Myoview 05/2011  . Osteoarthritis of back   . Osteoporosis   . Pneumonia "several times"  . Pulmonary embolism ([place]) 1999   "after my bypass"  . PVD (peripheral vascular disease) ([place])   . Shortness of breath   . Squamous carcinoma    "nose"  . Thyroid disease    Hypothyroid  . Unstable angina ([place]) 09/25/2015   Past Surgical History:  Procedure Laterality Date  . ABDOMINAL AORTIC ANEURYSM REPAIR     pt denies this hx on 01/02/2016  . BASAL CELL CARCINOMA EXCISION    . CARDIAC CATHETERIZATION N/A 01/02/2016    Procedure: Left Heart Cath and Coronary Angiography;  Surgeon: Wellington Hampshire, MD;  Location: [facility];  Service: Cardiovascular;  Laterality: N/A;  . CARDIAC CATHETERIZATION  1996; 1999  . CARDIAC CATHETERIZATION N/A 06/25/2016   Procedure: Left Heart Cath and Cors/Grafts Angiography;  Surgeon: Wellington Hampshire, MD;  Location: [facility];  Service: Cardiovascular;  Laterality: N/A;  . CAROTID ENDARTERECTOMY Left 01/02/2011  . CATARACT EXTRACTION W/ INTRAOCULAR LENS  IMPLANT, BILATERAL    . CLOSED REDUCTION NASAL FRACTURE  11/2007  . CORONARY ANGIOPLASTY WITH STENT PLACEMENT  10/10/2011   drug eluting  to rc & saphenous  . CORONARY ARTERY BYPASS GRAFT  1999   "CABG X3"  . DILATION AND CURETTAGE OF UTERUS    . FEMORAL ARTERY STENT Bilateral   . FRACTURE SURGERY    . LEFT HEART CATHETERIZATION WITH CORONARY/GRAFT ANGIOGRAM N/A 04/22/2011   Procedure: LEFT HEART CATHETERIZATION WITH Beatrix Fetters;  Surgeon: Jolaine Artist, MD;  Location: [facility] CATH LAB;  Service: Cardiovascular;  Laterality: N/A;  . LEFT HEART CATHETERIZATION WITH CORONARY/GRAFT ANGIOGRAM N/A 10/10/2011   Procedure: LEFT HEART CATHETERIZATION WITH Beatrix Fetters;  Surgeon: Peter M Martinique, MD;  Location: [facility] CATH LAB;  Service: Cardiovascular;  Laterality: N/A;  . NASAL SINUS SURGERY     twice  . SQUAMOUS CELL CARCINOMA EXCISION    . SVT ABLATION N/A 03/19/2018   Procedure: SVT ABLATION;  Surgeon: Constance Haw, MD;  Location: [place] [facility];  Service: Cardiovascular;  Laterality: N/A;  . TUBAL LIGATION       Current Outpatient Medications  Medication Sig Dispense Refill  . acetaminophen (TYLENOL) 650 MG CR tablet Take 1,300 mg by mouth 2 (two) times daily.    Marland Kitchen albuterol (PROVENTIL) (2.5 MG/3ML) 0.083% nebulizer solution Take 3 mLs (2.5 mg total) by nebulization every 4 (four) hours as needed for wheezing or shortness of breath. Dx:J44.9 75 mL 12  . albuterol (VENTOLIN HFA)  108 (90 Base) MCG/ACT inhaler INHALE 2 PUFFS INTO THE LUNGS EVERY 6 (SIX) HOURS AS NEEDED FOR WHEEZING. (Patient taking differently: Inhale 2 puffs into the lungs every 6 (six) hours as needed for wheezing or shortness of breath. ) 18 g 5  . amLODipine (NORVASC) 5 MG tablet TAKE 1 TABLET BY MOUTH ONCE DAILY (Patient taking differently: Take 5 mg by mouth daily. ) 90 tablet 3  . azelastine (ASTELIN) 0.1 % nasal spray Place 2 sprays into both nostrils at bedtime as needed for rhinitis. 30 mL 3  . Biotin 5 MG TABS Take 5 mg by mouth daily.     . Calcium Carbonate-Vitamin D (CALCIUM + D PO) Take 1 tablet by mouth daily.     . Cholecalciferol (VITAMIN D3) 2000 units TABS Take 2,000 Units by mouth daily.     Marland Kitchen  clopidogrel (PLAVIX) 75 MG tablet TAKE 1 TABLET BY MOUTH ONCE A DAY (Patient taking differently: Take 75 mg by mouth daily. ) 90 tablet 0  . dextromethorphan-guaiFENesin (MUCINEX DM) 30-600 MG 12hr tablet Take 1 tablet by mouth 2 (two) times daily. 10 tablet 0  . diltiazem (CARDIZEM CD) 240 MG 24 hr capsule Take 1 capsule (240 mg total) by mouth daily. Pt must keep appt in May to continue receiving refills 90 capsule 0  . diltiazem (CARDIZEM) 30 MG tablet Take one tablet by mouth every 4 hours AS NEEDED for A-fib HR > 100 as long as BP > 100. (Patient taking differently: Take 30 mg by mouth every 4 (four) hours as needed (aFib [HR >100 and if BP >100]). ) 45 tablet 0  . ezetimibe (ZETIA) 10 MG tablet TAKE 1 TABLET BY MOUTH DAILY (Patient taking differently: Take 10 mg by mouth at bedtime. ) 90 tablet 0  . furosemide (LASIX) 20 MG tablet Take 1 tablet (20 mg total) by mouth daily. 30 tablet 3  . HYDROcodone-acetaminophen (NORCO) 10-325 MG tablet Take 1 tablet by mouth every 6 (six) hours as needed for severe pain.    . isosorbide mononitrate (IMDUR) 60 MG 24 hr tablet TAKE 1 TABLET BY MOUTH TWICE A DAY (Patient taking differently: Take 60 mg by mouth 2 (two) times daily. ) 180 tablet 3  . Krill Oil 500  MG CAPS Take 500 mg by mouth daily.     Marland Kitchen levothyroxine (SYNTHROID) 50 MCG tablet TAKE 1 TABLET BY MOUTH ONCE DAILY (Patient taking differently: Take 50 mcg by mouth daily before breakfast. ) 90 tablet 3  . nitroGLYCERIN (NITROSTAT) 0.4 MG SL tablet Place 1 tablet (0.4 mg total) under the tongue every 5 (five) minutes as needed for chest pain. 25 tablet 0  . omeprazole (PRILOSEC) 20 MG capsule TAKE 1 CAPSULE BY MOUTH ONCE DAILY 30 capsule 5  . Polyethyl Glycol-Propyl Glycol (SYSTANE OP) Place 1 drop into both eyes 2 (two) times a day.     . rosuvastatin (CRESTOR) 40 MG tablet TAKE 1 TABLET BY MOUTH ONCE DAILY 90 tablet 0  . sertraline (ZOLOFT) 100 MG tablet Take 1 tablet (100 mg total) by mouth daily. 90 tablet 3  . STIOLTO RESPIMAT 2.5-2.5 MCG/ACT AERS INHALE 2 PUFFS INTO THE LUNGS ONCE DAILY (Patient taking differently: Inhale 2 puffs into the lungs daily. ) 4 g 10  . Tiotropium Bromide-Olodaterol (STIOLTO RESPIMAT) 2.5-2.5 MCG/ACT AERS Inhale 2 puffs into the lungs daily. 4 g 0  . Vitamin D, Ergocalciferol, (DRISDOL) 1.25 MG (50000 UNIT) CAPS capsule Take 1 capsule (50,000 Units total) by mouth once a week. 12 capsule 1  . XARELTO 15 MG TABS tablet TAKE 1 TABLET BY MOUTH ONCE A DAY WITH SUPPER (Patient taking differently: Take 15 mg by mouth at bedtime. ) 30 tablet 6  . zinc gluconate 50 MG tablet Take 50 mg by mouth daily.     No current facility-administered medications for this visit.    Allergies:   Erythromycin, Meperidine, Shellfish allergy, Ciprofloxacin, Codeine, Penicillins, and Tape   Social History:  The patient  reports that she quit smoking about 26 years ago. Her smoking use included cigarettes. She has a 20.00 pack-year smoking history. She has never used smokeless tobacco. She reports current alcohol use. She reports that she does not use drugs.   Family History:  The patient's family history includes Alcohol abuse in her brother; Aneurysm in her brother; Angina in her father;  Cirrhosis in her maternal grandfather; Colon cancer (age of onset: 40) in her father; Diabetes in her maternal grandmother and mother; Heart attack in her brother and brother; Heart attack (age of onset: 53) in her mother; Heart disease in her brother, brother, and father; Hyperlipidemia in her daughter; Hypertension in her brother and father; Hypothyroidism in her sister; Lung cancer in her father and sister; Obesity in her daughter and son; Stroke in her maternal grandmother and paternal grandmother.    ROS:  Please see the history of present illness.   Otherwise, review of systems is positive for none.   All other systems are reviewed and negative.   PHYSICAL EXAM: VS:  BP 128/70   Pulse 99   Ht 5' (1.524 m)   Wt 149 lb (67.6 kg)   SpO2 94%   BMI 29.10 kg/m  , BMI Body mass index is 29.1 kg/m. GEN: Well nourished, well developed, in no acute distress  HEENT: normal  Neck: no JVD, carotid bruits, or masses Cardiac: irregular; no murmurs, rubs, or gallops,no edema  Respiratory:  clear to auscultation bilaterally, normal work of breathing GI: soft, nontender, nondistended, + BS MS: no deformity or atrophy  Skin: warm and dry Neuro:  Strength and sensation are intact Psych: euthymic mood, full affect  EKG:  EKG is not ordered today. Personal review of the ekg ordered 08/12/19 shows atrial fibrillation, rate 92  Recent Labs: 03/22/2019: NT-Pro BNP 567 06/13/2019: ALT 9; TSH 1.98 08/16/2019: B Natriuretic Peptide 486.0 08/23/2019: BUN 17; Creatinine, Ser 0.98; Hemoglobin 13.7; Platelets 252.0; Potassium 3.8; Sodium 139    Lipid Panel     Component Value Date/Time   CHOL 119 06/13/2019 1425   CHOL 253 (H) 03/07/2016 0930   TRIG 217.0 (H) 06/13/2019 1425   HDL 32.10 (L) 06/13/2019 1425   HDL 32 (L) 03/07/2016 0930   CHOLHDL 4 06/13/2019 1425   VLDL 43.4 (H) 06/13/2019 1425   LDLCALC 78 12/01/2017 1404   LDLCALC 183 (H) 03/07/2016 0930   LDLDIRECT 61.0 06/13/2019 1425     Wt  Readings from Last 3 Encounters:  09/22/19 149 lb (67.6 kg)  09/05/19 146 lb 12.8 oz (66.6 kg)  08/23/19 143 lb 8 oz (65.1 kg)      Other studies Reviewed: Additional studies/ records that were reviewed today include: TTE 08/17/19 Review of the above records today demonstrates:  1. Left ventricular ejection fraction, by estimation, is 60 to 65%. The  left ventricle has normal function. The left ventricle has no regional  wall motion abnormalities. Left ventricular diastolic parameters are  indeterminate.  2. Right ventricular systolic function is moderately reduced. The right  ventricular size is moderately enlarged. moderately increased right  ventricular wall thickness. There is moderately elevated pulmonary artery  systolic pressure. The estimated right  ventricular systolic pressure is 94.8 mmHg.  3. Left atrial size was moderately dilated.  4. Right atrial size was moderately dilated.  5. The mitral valve is degenerative. Mild to moderate mitral valve  regurgitation.  6. Tricuspid valve regurgitation is severe.  7. The aortic valve is tricuspid. Aortic valve regurgitation is not  visualized. Mild to moderate aortic valve sclerosis/calcification is  present, without any evidence of aortic stenosis.  8. The inferior vena cava is dilated in size with <50% respiratory  variability, suggesting right atrial pressure of 15 mmHg.   Holter 01/10/18 - personally reviewed Normal sinus rhythm Rare episodes of SVT/atrial tachycardia, 130 runs longest run 14 beats with rate 138 bpm  Frequent PACs, 7%  Rare PVCs, 1% Rare PVCs and bigemeny  ASSESSMENT AND PLAN:  1.  SVT: EP study with inducible arrhythmia.  Currently on diltiazem.  2.  Hypertension: Currently well controlled  3.  Coronary artery disease status post CABG without angina: No current chest pain.  Continue Plavix.  4.  Persistent atrial fibrillation: Currently on Xarelto and diltiazem.  CHA2DS2-VASc of at least  5.  She has fatigue and shortness of breath.  On auscultation today, she is certainly in atrial fibrillation.  Due to that, we Pellegrino Kennard plan for cardioversion.  If she goes back into atrial fibrillation quickly, dofetilide may be a good option.  Current medicines are reviewed at length with the patient today.   The patient does not have concerns regarding her medicines.  The following changes were made today: None  Labs/ tests ordered today include:  Orders Placed This Encounter  Procedures  . Basic metabolic panel  . CBC     Disposition:   FU with Ercie Eliasen 3 months  Signed, Seith Aikey Meredith Leeds, MD  09/22/2019 11:54 AM     [facility] [address] [place] [address] [phone] (office) [phone] (fax)

## 2019-09-23 ENCOUNTER — Telehealth: Payer: Self-pay | Admitting: *Deleted

## 2019-09-23 MED ORDER — RIVAROXABAN 20 MG PO TABS: 20.0000 mg | ORAL_TABLET | Freq: Every day | ORAL | 6 refills | Status: DC

## 2019-09-23 NOTE — Telephone Encounter (Signed)
Spoke to pt about blood work results from yesterday. Explained Xarelto dosing needs to be adjusted/increased d/t improved Creatinine. Aware to increase to 20 mg once daily. Rx sent to pharmacy, pt will begin new dosing tonight.  Informed that DCCV will need to be moved out 3 weeks or TEE arranged.  Pt prefers to move procedure date out as she is feeling fine, fatigue/SOB not substantial enough at this time. She will call office if problems begin r/t AFib, otherwise she understands I will follow up at later date to re-schedule DCCV. Patient verbalized understanding and agreeable to plan.

## 2019-09-26 ENCOUNTER — Other Ambulatory Visit: Payer: Medicare HMO

## 2019-09-28 ENCOUNTER — Encounter (HOSPITAL_COMMUNITY): Admission: RE | Payer: Self-pay | Source: Home / Self Care

## 2019-09-28 ENCOUNTER — Ambulatory Visit (HOSPITAL_COMMUNITY): Admission: RE | Admit: 2019-09-28 | Payer: Medicare HMO | Source: Home / Self Care | Admitting: Cardiovascular Disease

## 2019-09-28 SURGERY — CARDIOVERSION
Anesthesia: General

## 2019-10-03 NOTE — Telephone Encounter (Signed)
Patient returned my call and is aware of appt 10/07/19 @10  am with Adline Peals, PA.

## 2019-10-03 NOTE — Telephone Encounter (Signed)
Pt confirms taking increased Xarelto since 5/1.  She is currently on full dose/correct dose.  No issues to report.  Aware I will forward note to [facility] to arrange OV for EKG and determine if DCCV still needed.  Aware they will arrange if needed. Patient verbalized understanding and agreeable to plan.

## 2019-10-03 NOTE — Telephone Encounter (Signed)
Called and left message for patient to call back to schedule appt with [facility]. 

## 2019-10-07 ENCOUNTER — Other Ambulatory Visit: Payer: Self-pay

## 2019-10-07 ENCOUNTER — Ambulatory Visit (HOSPITAL_COMMUNITY)
Admission: RE | Admit: 2019-10-07 | Discharge: 2019-10-07 | Disposition: A | Discharge status: Home / Self Care | Payer: Medicare HMO | Source: Ambulatory Visit | Attending: Physician Assistant | Admitting: Physician Assistant

## 2019-10-07 VITALS — BP 160/82 | HR 94 | Ht 60.0 in | Wt 146.0 lb

## 2019-10-07 DIAGNOSIS — I2510 Atherosclerotic heart disease of native coronary artery without angina pectoris: Secondary | ICD-10-CM | POA: Diagnosis not present

## 2019-10-07 DIAGNOSIS — G4733 Obstructive sleep apnea (adult) (pediatric): Secondary | ICD-10-CM | POA: Insufficient documentation

## 2019-10-07 DIAGNOSIS — Z86718 Personal history of other venous thrombosis and embolism: Secondary | ICD-10-CM | POA: Insufficient documentation

## 2019-10-07 DIAGNOSIS — K219 Gastro-esophageal reflux disease without esophagitis: Secondary | ICD-10-CM | POA: Diagnosis not present

## 2019-10-07 DIAGNOSIS — Z955 Presence of coronary angioplasty implant and graft: Secondary | ICD-10-CM | POA: Insufficient documentation

## 2019-10-07 DIAGNOSIS — E039 Hypothyroidism, unspecified: Secondary | ICD-10-CM | POA: Insufficient documentation

## 2019-10-07 DIAGNOSIS — Z833 Family history of diabetes mellitus: Secondary | ICD-10-CM | POA: Insufficient documentation

## 2019-10-07 DIAGNOSIS — Z87891 Personal history of nicotine dependence: Secondary | ICD-10-CM | POA: Insufficient documentation

## 2019-10-07 DIAGNOSIS — E785 Hyperlipidemia, unspecified: Secondary | ICD-10-CM | POA: Insufficient documentation

## 2019-10-07 DIAGNOSIS — Z888 Allergy status to other drugs, medicaments and biological substances status: Secondary | ICD-10-CM | POA: Insufficient documentation

## 2019-10-07 DIAGNOSIS — Z79899 Other long term (current) drug therapy: Secondary | ICD-10-CM | POA: Insufficient documentation

## 2019-10-07 DIAGNOSIS — Z8249 Family history of ischemic heart disease and other diseases of the circulatory system: Secondary | ICD-10-CM | POA: Insufficient documentation

## 2019-10-07 DIAGNOSIS — Z7901 Long term (current) use of anticoagulants: Secondary | ICD-10-CM | POA: Insufficient documentation

## 2019-10-07 DIAGNOSIS — Z951 Presence of aortocoronary bypass graft: Secondary | ICD-10-CM | POA: Diagnosis not present

## 2019-10-07 DIAGNOSIS — J449 Chronic obstructive pulmonary disease, unspecified: Secondary | ICD-10-CM | POA: Insufficient documentation

## 2019-10-07 DIAGNOSIS — Z885 Allergy status to narcotic agent status: Secondary | ICD-10-CM | POA: Diagnosis not present

## 2019-10-07 DIAGNOSIS — Z961 Presence of intraocular lens: Secondary | ICD-10-CM | POA: Diagnosis not present

## 2019-10-07 DIAGNOSIS — I4819 Other persistent atrial fibrillation: Secondary | ICD-10-CM | POA: Diagnosis not present

## 2019-10-07 DIAGNOSIS — Z91013 Allergy to seafood: Secondary | ICD-10-CM | POA: Insufficient documentation

## 2019-10-07 DIAGNOSIS — I739 Peripheral vascular disease, unspecified: Secondary | ICD-10-CM | POA: Insufficient documentation

## 2019-10-07 DIAGNOSIS — Z7902 Long term (current) use of antithrombotics/antiplatelets: Secondary | ICD-10-CM | POA: Diagnosis not present

## 2019-10-07 DIAGNOSIS — M1990 Unspecified osteoarthritis, unspecified site: Secondary | ICD-10-CM | POA: Insufficient documentation

## 2019-10-07 DIAGNOSIS — Z9842 Cataract extraction status, left eye: Secondary | ICD-10-CM | POA: Diagnosis not present

## 2019-10-07 DIAGNOSIS — Z800 Family history of malignant neoplasm of digestive organs: Secondary | ICD-10-CM | POA: Insufficient documentation

## 2019-10-07 DIAGNOSIS — Z880 Allergy status to penicillin: Secondary | ICD-10-CM | POA: Diagnosis not present

## 2019-10-07 DIAGNOSIS — I10 Essential (primary) hypertension: Secondary | ICD-10-CM | POA: Insufficient documentation

## 2019-10-07 DIAGNOSIS — I4891 Unspecified atrial fibrillation: Secondary | ICD-10-CM | POA: Diagnosis present

## 2019-10-07 DIAGNOSIS — D6869 Other thrombophilia: Secondary | ICD-10-CM | POA: Diagnosis not present

## 2019-10-07 DIAGNOSIS — Z881 Allergy status to other antibiotic agents status: Secondary | ICD-10-CM | POA: Insufficient documentation

## 2019-10-07 DIAGNOSIS — Z801 Family history of malignant neoplasm of trachea, bronchus and lung: Secondary | ICD-10-CM | POA: Insufficient documentation

## 2019-10-07 DIAGNOSIS — Z9841 Cataract extraction status, right eye: Secondary | ICD-10-CM | POA: Insufficient documentation

## 2019-10-07 LAB — BASIC METABOLIC PANEL
Anion gap: 12 (ref 5–15)
BUN: 13 mg/dL (ref 8–23)
CO2: 29 mmol/L (ref 22–32)
Calcium: 9.8 mg/dL (ref 8.9–10.3)
Chloride: 102 mmol/L (ref 98–111)
Creatinine, Ser: 0.98 mg/dL (ref 0.44–1.00)
GFR calc Af Amer: >60 mL/min (ref >60)
GFR calc non Af Amer: 54 mL/min — ABNORMAL LOW (ref >60)
Glucose, Bld: 118 mg/dL — ABNORMAL HIGH (ref 70–99)
Potassium: 4.1 mmol/L (ref 3.5–5.1)
Sodium: 143 mmol/L (ref 135–145)

## 2019-10-07 LAB — CBC
HCT: 47.7 % — ABNORMAL HIGH (ref 36.0–46.0)
Hemoglobin: 14.8 g/dL (ref 12.0–15.0)
MCH: 25.3 pg — ABNORMAL LOW (ref 26.0–34.0)
MCHC: 31 g/dL (ref 30.0–36.0)
MCV: 81.5 fL (ref 80.0–100.0)
Platelets: 215 10*3/uL (ref 150–400)
RBC: 5.85 MIL/uL — ABNORMAL HIGH (ref 3.87–5.11)
RDW: 16.7 % — ABNORMAL HIGH (ref 11.5–15.5)
WBC: 8.2 10*3/uL (ref 4.0–10.5)
nRBC: 0 % (ref 0.0–0.2)

## 2019-10-07 NOTE — H&P (View-Only) (Signed)
Primary Care Physician: Mosie Lukes, MD Primary Cardiologist: Dr Rockey Situ Primary Electrophysiologist: Dr Curt Bears Referring Physician: [facility]   Andrea [name] is a 79 y.o. female with a history ofcoronary disease status post CABG in 1999 status post PCI to the distal RCA in 2013, PCI x2 to the SVG to the RCA into the mid circumflex in 2017, PVD status post lower extremity stenting, carotid artery disease status post left-sided CEA, pulmonary hypertension, valvular heart disease, COPD, infrarenal AAA, PE, hypertension, hyperlipidemia, asthma, hypothyroidism, SVT/atrial tach, and persistent atrial fibrillation who presents for follow up in the [facility].  The patient was initially diagnosed with atrial fibrillation on 11/07/18 after presenting to the ER with symptoms of palpitations, dyspnea, and chest discomfort. At the ER, she was found to be in rate controlled afib. She was started on Xarelto.   On follow up today, patient was hospitalized for a COPD exacerbation 07/2019 and was found to be in afib at that time. Echo 08/16/19 showed preserved EF with mod dilated LA. She saw Dr Curt Bears on 09/22/19 and DCCV was recommended. She does continue to have dyspnea with exertion but it is unclear if this is from the afib vs COPD. Patient denies any missed doses of anticoagulation.   Today, she denies symptoms of palpitations, chest pain, orthopnea, PND, dizziness, presyncope, syncope, bleeding, or neurologic sequela. The patient is tolerating medications without difficulties and is otherwise without complaint today.    Atrial Fibrillation Risk Factors:  she does have symptoms or diagnosis of sleep apnea. she does not have a history of rheumatic fever. she does not have a history of alcohol use. The patient does not have a history of early familial atrial fibrillation or other arrhythmias.  she has a BMI of Body mass index is 28.51 kg/m.Marland Kitchen Filed Weights   10/07/19  0957  Weight: 66.2 kg    Family History  Problem Relation Age of Onset  . Heart attack Mother 27  . Diabetes Mother   . Colon cancer Father 68  . Lung cancer Father        smoked  . Heart disease Father   . Hypertension Father   . Angina Father   . Lung cancer Sister        smoked  . Hypothyroidism Sister   . Hypertension Brother   . Heart attack Brother   . Heart disease Brother        PTCA with Stent  . Heart attack Brother        CABG  . Heart disease Brother        CABG with 1 bypass  . Aneurysm Brother        brain  . Alcohol abuse Brother   . Hyperlipidemia Daughter   . Diabetes Maternal Grandmother   . Stroke Maternal Grandmother   . Cirrhosis Maternal Grandfather   . Stroke Paternal Grandmother   . Obesity Daughter   . Obesity Son      Atrial Fibrillation Management history:  Previous antiarrhythmic drugs: Multaq (stopped 2/2 side effects) Previous cardioversions: none Previous ablations: EP study 03/19/18 (tachy not inducible. SVT) CHADS2VASC score: 5 Anticoagulation history: Xarelto    Past Medical History:  Diagnosis Date  . AAA (abdominal aortic aneurysm) ([place]) 01/2009   AAA (2.8 x 3.0)  moderate RAS (left); 2.7 x 2.7 cm (07/11/05)  . Acute bronchitis 03/20/2013; 2017  . Angina   . Anosmia   . Asthma   . Baker's cyst  of knee 01/30/2014  . Basal cell carcinoma    "back and left arm"  . Bradycardia    Metoprolol stopped 08/2011  . CAD (coronary artery disease)    s/CABG (reports IMA and 2 SVGs) back in 1999  Myoview normal 3/10; s/p PCI with DES to PL branch of distal RCA 09/2011; PCI +DES to SVG-RCA, PCI + DES to mid LCx 12/2015  . Cerebrovascular disease 01/2009   carotid u/s  R 0-39%   L 60-79%  . Chronic thoracic back pain   . COPD (chronic obstructive pulmonary disease) ([place])   . Depression   . Dizziness   . Dysrhythmia    hx of sinus brady  . GERD (gastroesophageal reflux disease)   . Heart murmur   . Herniated lumbar disc without  myelopathy   . History of blood transfusion 1999   "when I had the bypass; had a PE"  . Hyperglycemia 10/23/2015  . Hyperlipidemia   . Hypertension   . Hypothyroidism 09/30/2016  . Medicare annual wellness visit, subsequent 07/31/2013  . NSTEMI (non-ST elevated myocardial infarction) ([place]) 11/12   Cath showed atretic IMA graft to the LAD, SVG to PD was patent but the continuation of this graft to the PL branch was occluded; there were L to R collaterals; Mid LAD had a 60 to 70% stenosis. She has been treated medically.  Neg Myoview 05/2011  . Osteoarthritis of back   . Osteoporosis   . Pneumonia "several times"  . Pulmonary embolism ([place]) 1999   "after my bypass"  . PVD (peripheral vascular disease) ([place])   . Shortness of breath   . Squamous carcinoma    "nose"  . Thyroid disease    Hypothyroid  . Unstable angina ([place]) 09/25/2015   Past Surgical History:  Procedure Laterality Date  . ABDOMINAL AORTIC ANEURYSM REPAIR     pt denies this hx on 01/02/2016  . BASAL CELL CARCINOMA EXCISION    . CARDIAC CATHETERIZATION N/A 01/02/2016   Procedure: Left Heart Cath and Coronary Angiography;  Surgeon: Wellington Hampshire, MD;  Location: [place];  Service: Cardiovascular;  Laterality: N/A;  . CARDIAC CATHETERIZATION  1996; 1999  . CARDIAC CATHETERIZATION N/A 06/25/2016   Procedure: Left Heart Cath and Cors/Grafts Angiography;  Surgeon: Wellington Hampshire, MD;  Location: [place] [facility];  Service: Cardiovascular;  Laterality: N/A;  . CAROTID ENDARTERECTOMY Left 01/02/2011  . CATARACT EXTRACTION W/ INTRAOCULAR LENS  IMPLANT, BILATERAL    . CLOSED REDUCTION NASAL FRACTURE  11/2007  . CORONARY ANGIOPLASTY WITH STENT PLACEMENT  10/10/2011   drug eluting  to rc & saphenous  . CORONARY ARTERY BYPASS GRAFT  1999   "CABG X3"  . DILATION AND CURETTAGE OF UTERUS    . FEMORAL ARTERY STENT Bilateral   . FRACTURE SURGERY    . LEFT HEART CATHETERIZATION WITH CORONARY/GRAFT ANGIOGRAM N/A 04/22/2011    Procedure: LEFT HEART CATHETERIZATION WITH Beatrix Fetters;  Surgeon: Jolaine Artist, MD;  [facility];  Service: Cardiovascular;  Laterality: N/A;  . LEFT HEART CATHETERIZATION WITH CORONARY/GRAFT ANGIOGRAM N/A 10/10/2011   Procedure: LEFT HEART CATHETERIZATION WITH Beatrix Fetters;  Surgeon: Peter M Martinique, MD;  Location: [facility] CATH LAB;  Service: Cardiovascular;  Laterality: N/A;  . NASAL SINUS SURGERY     twice  . SQUAMOUS CELL CARCINOMA EXCISION    . SVT ABLATION N/A 03/19/2018   Procedure: SVT ABLATION;  Surgeon: Constance Haw, MD;  Location: [place] [facility];  Service: Cardiovascular;  Laterality: N/A;  . TUBAL LIGATION      Current Outpatient Medications  Medication Sig Dispense Refill  . acetaminophen (TYLENOL) 650 MG CR tablet Take 1,300 mg by mouth 2 (two) times daily.    Marland Kitchen albuterol (PROVENTIL) (2.5 MG/3ML) 0.083% nebulizer solution Take 3 mLs (2.5 mg total) by nebulization every 4 (four) hours as needed for wheezing or shortness of breath. Dx:J44.9 75 mL 12  . albuterol (VENTOLIN HFA) 108 (90 Base) MCG/ACT inhaler INHALE 2 PUFFS INTO THE LUNGS EVERY 6 (SIX) HOURS AS NEEDED FOR WHEEZING. (Patient taking differently: Inhale 2 puffs into the lungs every 6 (six) hours as needed for wheezing or shortness of breath. ) 18 g 5  . amLODipine (NORVASC) 5 MG tablet TAKE 1 TABLET BY MOUTH ONCE DAILY (Patient taking differently: Take 5 mg by mouth daily. ) 90 tablet 3  . azelastine (ASTELIN) 0.1 % nasal spray Place 2 sprays into both nostrils at bedtime as needed for rhinitis. 30 mL 3  . Biotin 5 MG TABS Take 5 mg by mouth daily.     . Calcium Carbonate-Vitamin D (CALCIUM + D PO) Take 1 tablet by mouth daily.     . Cholecalciferol (VITAMIN D3) 2000 units TABS Take 2,000 Units by mouth daily.     . clopidogrel (PLAVIX) 75 MG tablet TAKE 1 TABLET BY MOUTH ONCE A DAY (Patient taking differently: Take 75 mg by mouth daily. ) 90 tablet 0  .  dextromethorphan-guaiFENesin (MUCINEX DM) 30-600 MG 12hr tablet Take 1 tablet by mouth 2 (two) times daily. (Patient taking differently: Take 1 tablet by mouth as needed. ) 10 tablet 0  . diltiazem (CARDIZEM CD) 240 MG 24 hr capsule Take 1 capsule (240 mg total) by mouth daily. Pt must keep appt in May to continue receiving refills 90 capsule 0  . diltiazem (CARDIZEM) 30 MG tablet Take one tablet by mouth every 4 hours AS NEEDED for A-fib HR > 100 as long as BP > 100. (Patient taking differently: Take 30 mg by mouth every 4 (four) hours as needed (aFib [HR >100 and if BP >100]). ) 45 tablet 0  . ezetimibe (ZETIA) 10 MG tablet TAKE 1 TABLET BY MOUTH DAILY (Patient taking differently: Take 10 mg by mouth at bedtime. ) 90 tablet 0  . furosemide (LASIX) 20 MG tablet Take 1 tablet (20 mg total) by mouth daily. 30 tablet 3  . HYDROcodone-acetaminophen (NORCO) 10-325 MG tablet Take 1 tablet by mouth every 6 (six) hours as needed for severe pain.    . isosorbide mononitrate (IMDUR) 60 MG 24 hr tablet TAKE 1 TABLET BY MOUTH TWICE A DAY (Patient taking differently: Take 60 mg by mouth 2 (two) times daily. ) 180 tablet 3  . Krill Oil 500 MG CAPS Take 500 mg by mouth daily.     Marland Kitchen levothyroxine (SYNTHROID) 50 MCG tablet TAKE 1 TABLET BY MOUTH ONCE DAILY (Patient taking differently: Take 50 mcg by mouth daily before breakfast. ) 90 tablet 3  . nitroGLYCERIN (NITROSTAT) 0.4 MG SL tablet Place 1 tablet (0.4 mg total) under the tongue every 5 (five) minutes as needed for chest pain. 25 tablet 0  . omeprazole (PRILOSEC) 20 MG capsule TAKE 1 CAPSULE BY MOUTH ONCE DAILY 30 capsule 5  . Polyethyl Glycol-Propyl Glycol (SYSTANE OP) Place 1 drop into both eyes 2 (two) times a day.     . rivaroxaban (XARELTO) 20 MG TABS tablet Take 1 tablet (20 mg total) by mouth  daily with supper. 30 tablet 6  . rosuvastatin (CRESTOR) 40 MG tablet TAKE 1 TABLET BY MOUTH ONCE DAILY 90 tablet 0  . sertraline (ZOLOFT) 100 MG tablet Take 1 tablet  (100 mg total) by mouth daily. 90 tablet 3  . STIOLTO RESPIMAT 2.5-2.5 MCG/ACT AERS INHALE 2 PUFFS INTO THE LUNGS ONCE DAILY (Patient taking differently: Inhale 2 puffs into the lungs daily. ) 4 g 10  . Tiotropium Bromide-Olodaterol (STIOLTO RESPIMAT) 2.5-2.5 MCG/ACT AERS Inhale 2 puffs into the lungs daily. 4 g 0  . Vitamin D, Ergocalciferol, (DRISDOL) 1.25 MG (50000 UNIT) CAPS capsule Take 1 capsule (50,000 Units total) by mouth once a week. 12 capsule 1  . zinc gluconate 50 MG tablet Take 50 mg by mouth daily.     No current facility-administered medications for this encounter.    Allergies  Allergen Reactions  . Erythromycin Other (See Comments)    Tongue burns  . Meperidine Nausea And Vomiting  . Shellfish Allergy Nausea And Vomiting  . Ciprofloxacin Rash and Other (See Comments)    Rash from IV  . Codeine Nausea And Vomiting  . Penicillins Rash and Other (See Comments)    Has patient had a PCN reaction causing immediate rash, facial/tongue/throat swelling, SOB or lightheadedness with hypotension:unsure Has patient had a PCN reaction causing severe rash involving mucus membranes or skin necrosis:No Has patient had a PCN reaction that required hospitalization:No Has patient had a PCN reaction occurring within the last 10 years:No If all of the above answers are "NO", then may proceed with Cephalosporin use.   . Tape Rash    pls use paper tape    Social History   Socioeconomic History  . Marital status: Married    Spouse name: Not on file  . Number of children: 5  . Years of education: Not on file  . Highest education level: Not on file  Occupational History  . 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 Smoking status: Former Smoker    Packs/day: 0.50    Years: 40.00    Pack years: 20.00    Types: Cigarettes    Quit date: 01/19/1993    Years since quitting: 26.7  . Smokeless tobacco: Never Used  Substance and Sexual Activity   . Alcohol use: Yes    Comment: 01/02/2016 "might drink a glass of wine socially q 3-4 months"  . Drug use: No  . Sexual activity: Not Currently    Birth control/protection: Post-menopausal  Other Topics Concern  . Not on file  Social History Narrative   Married with 5 children   Social Determinants of Health   Financial Resource Strain:   . Difficulty of Paying Living Expenses:   Food Insecurity:   . Worried About Charity fundraiser in the Last Year:   . Arboriculturist in the Last Year:   Transportation Needs:   . Film/video editor (Medical):   Marland Kitchen Lack of Transportation (Non-Medical):   Physical Activity:   . Days of Exercise per Week:   . Minutes of Exercise per Session:   Stress:   . Feeling of Stress :   Social Connections:   . Frequency of Communication with Friends and Family:   . Frequency of Social Gatherings with Friends and Family:   . Attends Religious Services:   . Active Member of Clubs or Organizations:   . Attends Archivist Meetings:   .  Marital Status:   Intimate Partner Violence:   . Fear of Current or Ex-Partner:   . Emotionally Abused:   Marland Kitchen Physically Abused:   . Sexually Abused:      ROS- All systems are reviewed and negative except as per the HPI above.  Physical Exam: Vitals:   10/07/19 0957  BP: (!) 160/82  Pulse: 94  Weight: 66.2 kg  Height: 5' (1.524 m)    GEN- The patient is well appearing elderly female, alert and oriented x 3 today.   HEENT-head normocephalic, atraumatic, sclera clear, conjunctiva pink, hearing intact, trachea midline. Lungs- Clear to ausculation bilaterally, normal work of breathing Heart- irregular rate and rhythm, no murmurs, rubs or gallops  GI- soft, NT, ND, + BS Extremities- no clubbing, cyanosis, or edema MS- no significant deformity or atrophy Skin- no rash or lesion Psych- euthymic mood, full affect Neuro- strength and sensation are intact   Wt Readings from Last 3 Encounters:  10/07/19  66.2 kg  09/22/19 67.6 kg  09/05/19 66.6 kg    EKG today demonstrates afib HR 94, NST, QRS 86, QTc 462  Echo 08/16/19 demonstrated  1. Left ventricular ejection fraction, by estimation, is 60 to 65%. The  left ventricle has normal function. The left ventricle has no regional  wall motion abnormalities. Left ventricular diastolic parameters are  indeterminate.  2. Right ventricular systolic function is moderately reduced. The right  ventricular size is moderately enlarged. moderately increased right  ventricular wall thickness. There is moderately elevated pulmonary artery  systolic pressure. The estimated right  ventricular systolic pressure is 16.1 mmHg.  3. Left atrial size was moderately dilated.  4. Right atrial size was moderately dilated.  5. The mitral valve is degenerative. Mild to moderate mitral valve  regurgitation.  6. Tricuspid valve regurgitation is severe.  7. The aortic valve is tricuspid. Aortic valve regurgitation is not  visualized. Mild to moderate aortic valve sclerosis/calcification is  present, without any evidence of aortic stenosis.  8. The inferior vena cava is dilated in size with <50% respiratory  variability, suggesting right atrial pressure of 15 mmHg.   Epic records are reviewed at length today  Assessment and Plan:  1. Persistent atrial fibrillation Patient remains in afib today. Patient's AAD options are limited: did not tolerate Multaq 2/2 GI side effects, class IC contraindicated with CAD, would also avoid amiodarone given lung disease.  We discussed therapeutic options including DCCV, dofetilide, and rate control. Patient agreeable to trial of SR with DCCV alone. If she feels no change in her symptoms, she may be a candidate for rate control. If she does have symptomatic improvement, could consider dofetilide for rhythm control.  Continue diltiazem 180 mg daily and 30 mg PRN q 4hrs for heart racing. Continue Xarelto 20 mg daily. Check  CBC/bmet  This patients CHA2DS2-VASc Score and unadjusted Ischemic Stroke Rate (% per year) is equal to 7.2 % stroke rate/year from a score of 5  Above score calculated as 1 point each if present [CHF, HTN, DM, Vascular=MI/PAD/Aortic Plaque, Age if 65-74, or Female] Above score calculated as 2 points each if present [Age > 75, or Stroke/TIA/TE]  2. HTN Stable, no changes today.  3. Obstructive sleep apnea Encouraged compliance with CPAP therapy.   4. CAD s/p CABG No anginal symptoms.   Follow up in the AF clinic one week post DCCV.   [facility] [address] [place], [place] [address] [phone] 10/07/2019 10:44 AM

## 2019-10-07 NOTE — Patient Instructions (Signed)
Cardioversion scheduled for Wednesday, May 26th  - Arrive at the [facility] and go to admitting at [facility] not eat or drink anything after midnight the night prior to your procedure.  - Take all your morning medication with a sip of water prior to arrival.  - You will not be able to drive home after your procedure.

## 2019-10-07 NOTE — Progress Notes (Addendum)
Primary Care Physician: Mosie Lukes, MD Primary Cardiologist: Dr Rockey Situ Primary Electrophysiologist: Dr Curt Bears Referring Physician: [facility]   Andrea [name] is a 79 y.o. female with a history ofcoronary disease status post CABG in 1999 status post PCI to the distal RCA in 2013, PCI x2 to the SVG to the RCA into the mid circumflex in 2017, PVD status post lower extremity stenting, carotid artery disease status post left-sided CEA, pulmonary hypertension, valvular heart disease, COPD, infrarenal AAA, PE, hypertension, hyperlipidemia, asthma, hypothyroidism, SVT/atrial tach, and persistent atrial fibrillation who presents for follow up in the [facility].  The patient was initially diagnosed with atrial fibrillation on 11/07/18 after presenting to the ER with symptoms of palpitations, dyspnea, and chest discomfort. At the ER, she was found to be in rate controlled afib. She was started on Xarelto.   On follow up today, patient was hospitalized for a COPD exacerbation 07/2019 and was found to be in afib at that time. Echo 08/16/19 showed preserved EF with mod dilated LA. She saw Dr Curt Bears on 09/22/19 and DCCV was recommended. She does continue to have dyspnea with exertion but it is unclear if this is from the afib vs COPD. Patient denies any missed doses of anticoagulation.   Today, she denies symptoms of palpitations, chest pain, orthopnea, PND, dizziness, presyncope, syncope, bleeding, or neurologic sequela. The patient is tolerating medications without difficulties and is otherwise without complaint today.    Atrial Fibrillation Risk Factors:  she does have symptoms or diagnosis of sleep apnea. she does not have a history of rheumatic fever. she does not have a history of alcohol use. The patient does not have a history of early familial atrial fibrillation or other arrhythmias.  she has a BMI of Body mass index is 28.51 kg/m.Marland Kitchen Filed Weights   10/07/19  0957  Weight: 66.2 kg    Family History  Problem Relation Age of Onset  . Heart attack Mother 79  . Diabetes Mother   . Colon cancer Father 32  . Lung cancer Father        smoked  . Heart disease Father   . Hypertension Father   . Angina Father   . Lung cancer Sister        smoked  . Hypothyroidism Sister   . Hypertension Brother   . Heart attack Brother   . Heart disease Brother        PTCA with Stent  . Heart attack Brother        CABG  . Heart disease Brother        CABG with 1 bypass  . Aneurysm Brother        brain  . Alcohol abuse Brother   . Hyperlipidemia Daughter   . Diabetes Maternal Grandmother   . Stroke Maternal Grandmother   . Cirrhosis Maternal Grandfather   . Stroke Paternal Grandmother   . Obesity Daughter   . Obesity Son      Atrial Fibrillation Management history:  Previous antiarrhythmic drugs: Multaq (stopped 2/2 side effects) Previous cardioversions: none Previous ablations: EP study 03/19/18 (tachy not inducible. SVT) CHADS2VASC score: 5 Anticoagulation history: Xarelto    Past Medical History:  Diagnosis Date  . AAA (abdominal aortic aneurysm) ([place]) 01/2009   AAA (2.8 x 3.0)  moderate RAS (left); 2.7 x 2.7 cm (07/11/05)  . Acute bronchitis 03/20/2013; 2017  . Angina   . Anosmia   . Asthma   . Baker's cyst  of knee 01/30/2014  . Basal cell carcinoma    "back and left arm"  . Bradycardia    Metoprolol stopped 08/2011  . CAD (coronary artery disease)    s/CABG (reports IMA and 2 SVGs) back in 1999  Myoview normal 3/10; s/p PCI with DES to PL branch of distal RCA 09/2011; PCI +DES to SVG-RCA, PCI + DES to mid LCx 12/2015  . Cerebrovascular disease 01/2009   carotid u/s  R 0-39%   L 60-79%  . Chronic thoracic back pain   . COPD (chronic obstructive pulmonary disease) ([place])   . Depression   . Dizziness   . Dysrhythmia    hx of sinus brady  . GERD (gastroesophageal reflux disease)   . Heart murmur   . Herniated lumbar disc without  myelopathy   . History of blood transfusion 1999   "when I had the bypass; had a PE"  . Hyperglycemia 10/23/2015  . Hyperlipidemia   . Hypertension   . Hypothyroidism 09/30/2016  . Medicare annual wellness visit, subsequent 07/31/2013  . NSTEMI (non-ST elevated myocardial infarction) ([place]) 11/12   Cath showed atretic IMA graft to the LAD, SVG to PD was patent but the continuation of this graft to the PL branch was occluded; there were L to R collaterals; Mid LAD had a 60 to 70% stenosis. She has been treated medically.  Neg Myoview 05/2011  . Osteoarthritis of back   . Osteoporosis   . Pneumonia "several times"  . Pulmonary embolism ([place]) 1999   "after my bypass"  . PVD (peripheral vascular disease) ([place])   . Shortness of breath   . Squamous carcinoma    "nose"  . Thyroid disease    Hypothyroid  . Unstable angina ([place]) 09/25/2015   Past Surgical History:  Procedure Laterality Date  . ABDOMINAL AORTIC ANEURYSM REPAIR     pt denies this hx on 01/02/2016  . BASAL CELL CARCINOMA EXCISION    . CARDIAC CATHETERIZATION N/A 01/02/2016   Procedure: Left Heart Cath and Coronary Angiography;  Surgeon: Wellington Hampshire, MD;  Location: [facility];  Service: Cardiovascular;  Laterality: N/A;  . CARDIAC CATHETERIZATION  1996; 1999  . CARDIAC CATHETERIZATION N/A 06/25/2016   Procedure: Left Heart Cath and Cors/Grafts Angiography;  Surgeon: Wellington Hampshire, MD;  Location: [facility];  Service: Cardiovascular;  Laterality: N/A;  . CAROTID ENDARTERECTOMY Left 01/02/2011  . CATARACT EXTRACTION W/ INTRAOCULAR LENS  IMPLANT, BILATERAL    . CLOSED REDUCTION NASAL FRACTURE  11/2007  . CORONARY ANGIOPLASTY WITH STENT PLACEMENT  10/10/2011   drug eluting  to rc & saphenous  . CORONARY ARTERY BYPASS GRAFT  1999   "CABG X3"  . DILATION AND CURETTAGE OF UTERUS    . FEMORAL ARTERY STENT Bilateral   . FRACTURE SURGERY    . LEFT HEART CATHETERIZATION WITH CORONARY/GRAFT ANGIOGRAM N/A 04/22/2011    Procedure: LEFT HEART CATHETERIZATION WITH Beatrix Fetters;  Surgeon: Jolaine Artist, MD;  Location[facility];  Service: Cardiovascular;  Laterality: N/A;  . LEFT HEART CATHETERIZATION WITH CORONARY/GRAFT ANGIOGRAM N/A 10/10/2011   Procedure: LEFT HEART CATHETERIZATION WITH Beatrix Fetters;  Surgeon: Peter M Martinique, MD;  Location: [facility] CATH LAB;  Service: Cardiovascular;  Laterality: N/A;  . NASAL SINUS SURGERY     twice  . SQUAMOUS CELL CARCINOMA EXCISION    . SVT ABLATION N/A 03/19/2018   Procedure: SVT ABLATION;  Surgeon: Constance Haw, MD;  Location: [facility];  Service: Cardiovascular;  Laterality: N/A;  . TUBAL LIGATION      Current Outpatient Medications  Medication Sig Dispense Refill  . acetaminophen (TYLENOL) 650 MG CR tablet Take 1,300 mg by mouth 2 (two) times daily.    Marland Kitchen albuterol (PROVENTIL) (2.5 MG/3ML) 0.083% nebulizer solution Take 3 mLs (2.5 mg total) by nebulization every 4 (four) hours as needed for wheezing or shortness of breath. Dx:J44.9 75 mL 12  . albuterol (VENTOLIN HFA) 108 (90 Base) MCG/ACT inhaler INHALE 2 PUFFS INTO THE LUNGS EVERY 6 (SIX) HOURS AS NEEDED FOR WHEEZING. (Patient taking differently: Inhale 2 puffs into the lungs every 6 (six) hours as needed for wheezing or shortness of breath. ) 18 g 5  . amLODipine (NORVASC) 5 MG tablet TAKE 1 TABLET BY MOUTH ONCE DAILY (Patient taking differently: Take 5 mg by mouth daily. ) 90 tablet 3  . azelastine (ASTELIN) 0.1 % nasal spray Place 2 sprays into both nostrils at bedtime as needed for rhinitis. 30 mL 3  . Biotin 5 MG TABS Take 5 mg by mouth daily.     . Calcium Carbonate-Vitamin D (CALCIUM + D PO) Take 1 tablet by mouth daily.     . Cholecalciferol (VITAMIN D3) 2000 units TABS Take 2,000 Units by mouth daily.     . clopidogrel (PLAVIX) 75 MG tablet TAKE 1 TABLET BY MOUTH ONCE A DAY (Patient taking differently: Take 75 mg by mouth daily. ) 90 tablet 0  .  dextromethorphan-guaiFENesin (MUCINEX DM) 30-600 MG 12hr tablet Take 1 tablet by mouth 2 (two) times daily. (Patient taking differently: Take 1 tablet by mouth as needed. ) 10 tablet 0  . diltiazem (CARDIZEM CD) 240 MG 24 hr capsule Take 1 capsule (240 mg total) by mouth daily. Pt must keep appt in May to continue receiving refills 90 capsule 0  . diltiazem (CARDIZEM) 30 MG tablet Take one tablet by mouth every 4 hours AS NEEDED for A-fib HR > 100 as long as BP > 100. (Patient taking differently: Take 30 mg by mouth every 4 (four) hours as needed (aFib [HR >100 and if BP >100]). ) 45 tablet 0  . ezetimibe (ZETIA) 10 MG tablet TAKE 1 TABLET BY MOUTH DAILY (Patient taking differently: Take 10 mg by mouth at bedtime. ) 90 tablet 0  . furosemide (LASIX) 20 MG tablet Take 1 tablet (20 mg total) by mouth daily. 30 tablet 3  . HYDROcodone-acetaminophen (NORCO) 10-325 MG tablet Take 1 tablet by mouth every 6 (six) hours as needed for severe pain.    . isosorbide mononitrate (IMDUR) 60 MG 24 hr tablet TAKE 1 TABLET BY MOUTH TWICE A DAY (Patient taking differently: Take 60 mg by mouth 2 (two) times daily. ) 180 tablet 3  . Krill Oil 500 MG CAPS Take 500 mg by mouth daily.     Marland Kitchen levothyroxine (SYNTHROID) 50 MCG tablet TAKE 1 TABLET BY MOUTH ONCE DAILY (Patient taking differently: Take 50 mcg by mouth daily before breakfast. ) 90 tablet 3  . nitroGLYCERIN (NITROSTAT) 0.4 MG SL tablet Place 1 tablet (0.4 mg total) under the tongue every 5 (five) minutes as needed for chest pain. 25 tablet 0  . omeprazole (PRILOSEC) 20 MG capsule TAKE 1 CAPSULE BY MOUTH ONCE DAILY 30 capsule 5  . Polyethyl Glycol-Propyl Glycol (SYSTANE OP) Place 1 drop into both eyes 2 (two) times a day.     . rivaroxaban (XARELTO) 20 MG TABS tablet Take 1 tablet (20 mg total) by mouth  daily with supper. 30 tablet 6  . rosuvastatin (CRESTOR) 40 MG tablet TAKE 1 TABLET BY MOUTH ONCE DAILY 90 tablet 0  . sertraline (ZOLOFT) 100 MG tablet Take 1 tablet  (100 mg total) by mouth daily. 90 tablet 3  . STIOLTO RESPIMAT 2.5-2.5 MCG/ACT AERS INHALE 2 PUFFS INTO THE LUNGS ONCE DAILY (Patient taking differently: Inhale 2 puffs into the lungs daily. ) 4 g 10  . Tiotropium Bromide-Olodaterol (STIOLTO RESPIMAT) 2.5-2.5 MCG/ACT AERS Inhale 2 puffs into the lungs daily. 4 g 0  . Vitamin D, Ergocalciferol, (DRISDOL) 1.25 MG (50000 UNIT) CAPS capsule Take 1 capsule (50,000 Units total) by mouth once a week. 12 capsule 1  . zinc gluconate 50 MG tablet Take 50 mg by mouth daily.     No current facility-administered medications for this encounter.    Allergies  Allergen Reactions  . Erythromycin Other (See Comments)    Tongue burns  . Meperidine Nausea And Vomiting  . Shellfish Allergy Nausea And Vomiting  . Ciprofloxacin Rash and Other (See Comments)    Rash from IV  . Codeine Nausea And Vomiting  . Penicillins Rash and Other (See Comments)    Has patient had a PCN reaction causing immediate rash, facial/tongue/throat swelling, SOB or lightheadedness with hypotension:unsure Has patient had a PCN reaction causing severe rash involving mucus membranes or skin necrosis:No Has patient had a PCN reaction that required hospitalization:No Has patient had a PCN reaction occurring within the last 10 years:No If all of the above answers are "NO", then may proceed with Cephalosporin use.   . Tape Rash    pls use paper tape    Social History   Socioeconomic History  . Marital status: Married    Spouse name: Not on file  . Number of children: 5  . Years of education: Not on file  . Highest education level: Not on file  Occupational History  . 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 Smoking status: Former Smoker    Packs/day: 0.50    Years: 40.00    Pack years: 20.00    Types: Cigarettes    Quit date: 01/19/1993    Years since quitting: 26.7  . Smokeless tobacco: Never Used  Substance and Sexual Activity    . Alcohol use: Yes    Comment: 01/02/2016 "might drink a glass of wine socially q 3-4 months"  . Drug use: No  . Sexual activity: Not Currently    Birth control/protection: Post-menopausal  Other Topics Concern  . Not on file  Social History Narrative   Married with 5 children   Social Determinants of Health   Financial Resource Strain:   . Difficulty of Paying Living Expenses:   Food Insecurity:   . Worried About Charity fundraiser in the Last Year:   . Arboriculturist in the Last Year:   Transportation Needs:   . Film/video editor (Medical):   Marland Kitchen Lack of Transportation (Non-Medical):   Physical Activity:   . Days of Exercise per Week:   . Minutes of Exercise per Session:   Stress:   . Feeling of Stress :   Social Connections:   . Frequency of Communication with Friends and Family:   . Frequency of Social Gatherings with Friends and Family:   . Attends Religious Services:   . Active Member of Clubs or Organizations:   . Attends Archivist Meetings:   .  Marital Status:   Intimate Partner Violence:   . Fear of Current or Ex-Partner:   . Emotionally Abused:   Marland Kitchen Physically Abused:   . Sexually Abused:      ROS- All systems are reviewed and negative except as per the HPI above.  Physical Exam: Vitals:   10/07/19 0957  BP: (!) 160/82  Pulse: 94  Weight: 66.2 kg  Height: 5' (1.524 m)    GEN- The patient is well appearing elderly female, alert and oriented x 3 today.   HEENT-head normocephalic, atraumatic, sclera clear, conjunctiva pink, hearing intact, trachea midline. Lungs- Clear to ausculation bilaterally, normal work of breathing Heart- irregular rate and rhythm, no murmurs, rubs or gallops  GI- soft, NT, ND, + BS Extremities- no clubbing, cyanosis, or edema MS- no significant deformity or atrophy Skin- no rash or lesion Psych- euthymic mood, full affect Neuro- strength and sensation are intact   Wt Readings from Last 3 Encounters:   10/07/19 66.2 kg  09/22/19 67.6 kg  09/05/19 66.6 kg    EKG today demonstrates afib HR 94, NST, QRS 86, QTc 462  Echo 08/16/19 demonstrated  1. Left ventricular ejection fraction, by estimation, is 60 to 65%. The  left ventricle has normal function. The left ventricle has no regional  wall motion abnormalities. Left ventricular diastolic parameters are  indeterminate.  2. Right ventricular systolic function is moderately reduced. The right  ventricular size is moderately enlarged. moderately increased right  ventricular wall thickness. There is moderately elevated pulmonary artery  systolic pressure. The estimated right  ventricular systolic pressure is 94.7 mmHg.  3. Left atrial size was moderately dilated.  4. Right atrial size was moderately dilated.  5. The mitral valve is degenerative. Mild to moderate mitral valve  regurgitation.  6. Tricuspid valve regurgitation is severe.  7. The aortic valve is tricuspid. Aortic valve regurgitation is not  visualized. Mild to moderate aortic valve sclerosis/calcification is  present, without any evidence of aortic stenosis.  8. The inferior vena cava is dilated in size with <50% respiratory  variability, suggesting right atrial pressure of 15 mmHg.   Epic records are reviewed at length today  Assessment and Plan:  1. Persistent atrial fibrillation Patient remains in afib today. Patient's AAD options are limited: did not tolerate Multaq 2/2 GI side effects, class IC contraindicated with CAD, would also avoid amiodarone given lung disease.  We discussed therapeutic options including DCCV, dofetilide, and rate control. Patient agreeable to trial of SR with DCCV alone. If she feels no change in her symptoms, she may be a candidate for rate control. If she does have symptomatic improvement, could consider dofetilide for rhythm control.  Continue diltiazem 180 mg daily and 30 mg PRN q 4hrs for heart racing. Continue Xarelto 20 mg  daily. Check CBC/bmet  This patients CHA2DS2-VASc Score and unadjusted Ischemic Stroke Rate (% per year) is equal to 7.2 % stroke rate/year from a score of 5  Above score calculated as 1 point each if present [CHF, HTN, DM, Vascular=MI/PAD/Aortic Plaque, Age if 65-74, or Female] Above score calculated as 2 points each if present [Age > 75, or Stroke/TIA/TE]  2. HTN Stable, no changes today.  3. Obstructive sleep apnea Encouraged compliance with CPAP therapy.   4. CAD s/p CABG No anginal symptoms.   Follow up in the AF clinic one week post DCCV.   [facility] [address] [place], [place] [address] [phone] 10/07/2019 10:44 AM

## 2019-10-11 ENCOUNTER — Other Ambulatory Visit: Payer: Self-pay

## 2019-10-11 ENCOUNTER — Ambulatory Visit (INDEPENDENT_AMBULATORY_CARE_PROVIDER_SITE_OTHER): Payer: Medicare HMO | Admitting: Cardiovascular Disease

## 2019-10-11 VITALS — BP 120/66 | HR 79 | Ht 62.0 in | Wt 148.0 lb

## 2019-10-11 DIAGNOSIS — I25810 Atherosclerosis of coronary artery bypass graft(s) without angina pectoris: Secondary | ICD-10-CM

## 2019-10-11 DIAGNOSIS — D6869 Other thrombophilia: Secondary | ICD-10-CM | POA: Diagnosis not present

## 2019-10-11 DIAGNOSIS — I471 Supraventricular tachycardia: Secondary | ICD-10-CM

## 2019-10-11 DIAGNOSIS — I4819 Other persistent atrial fibrillation: Secondary | ICD-10-CM | POA: Diagnosis not present

## 2019-10-11 DIAGNOSIS — I10 Essential (primary) hypertension: Secondary | ICD-10-CM | POA: Diagnosis not present

## 2019-10-11 DIAGNOSIS — R0602 Shortness of breath: Secondary | ICD-10-CM | POA: Diagnosis not present

## 2019-10-11 MED ORDER — FUROSEMIDE 20 MG PO TABS
20.0000 mg | ORAL_TABLET | ORAL | 3 refills | Status: DC
Start: 2019-10-11 — End: 2020-01-27

## 2019-10-11 NOTE — Patient Instructions (Addendum)
Medication Instructions:  Please take lasix 20 daily, extra lasix 20 mg at 2 pm for shortness of breath or abdominal swelling.  If you need a refill on your cardiac medications before your next appointment, please call your pharmacy.    Lab work: No new labs needed   If you have labs (blood work) drawn today and your tests are completely normal, you will receive your results only by: Marland Kitchen MyChart Message (if you have MyChart) OR . A paper copy in the mail If you have any lab test that is abnormal or we need to change your treatment, we will call you to review the results.   Testing/Procedures: No new testing needed   Follow-Up: At [facility], you and your health needs are our priority.  As part of our continuing mission to provide you with exceptional heart care, we have created designated Provider Care Teams.  These Care Teams include your primary Cardiologist (physician) and Advanced Practice Providers (APPs -  Physician Assistants and Nurse Practitioners) who all work together to provide you with the care you need, when you need it.  . You will need a follow up appointment in 12 months   . Providers on your designated Care Team:   . Murray Hodgkins, NP . Christell Faith, PA-C . Marrianne Mood, PA-C  Any Other Special Instructions Will Be Listed Below (If Applicable).  For educational health videos Log in to : [email or web address] Or : [email or web address], password : [email or web address]

## 2019-10-11 NOTE — Progress Notes (Signed)
Cardiology Office Note  Date:  10/11/2019   ID:  Andrea [name], DOB June 11, 1940, MRN [id number]  PCP:  Mosie Lukes, MD   Chief Complaint  Patient presents with  . Other    6 month follow up. cardioversion scheduled. Patient c/o swelling and SOB. Meds reviewed verbally with patient.     HPI:  Andrea [name] is a pleasant 79 year-old woman with history of  chronic bradycardia,  spinal stenosis,  peripheral vascular disease,  stents to her lower extremities status post CEA on the left, 40-59% residual disease on the right, 10/17 hyperlipidemia,  COPD, long smoking history for at least 30 years coronary artery disease and bypass surgery in 1999,  stent placement to distal RCA may 2013, In August 2017 she had 2 stents placed to vein graft to the RCA, also with stents to the mid left circumflex with resolution of her anginal symptoms cardiac catheterization 06/25/2016 occluded vein graft to the RCA, 60% left ostial subclavian vessel disease Shepresents for routine followup Of her coronary artery disease .  She is part of the bio freedom study  Last seen in clinic by myself November 2020 at that time had COPD exacerbation treated with antibiotics prednisone nebulizers Lasix for several days Diltiazem had been increased for rate and rhythm control by Dr. Curt Bears  Had follow-up with EP September 22, 2019/[facility]. fib clinic Atrial fibrillation documented Symptoms of fatigue and shortness of breath Plan was for cardioversion  10/19/2019 For any recurrence of atrial fibrillation plan was for possible start of dofetilide  On todays visit, has fatigue, Some SOB earlier today Did inhaler  Did not tolerate multaq No edema On lasix daily 20 mg  Husband with back pain Spinal stimulator, hip pain   Other past medical history reviewed Failed SVT ablation October 2019 Could be not induced  cardiac catheterization 06/25/2016 occluded vein graft to the RCA, 60% left ostial subclavian vessel  disease Medical management   Echo 08/11/2016 Normal EF, moderately elevated RVSP 57 mm Hg, moderate to severe TR  Carotid 10/17 40 to 59% disease on the right, <39% on the left  Denies any significant anginal symptoms previously stopped her Crestor, cholesterol from 144 up to 242 in May 30. 2017 Now back on crestor LDL 64, total chol 126  Cardiac catheterization results 1. Significant underlying three-vessel coronary artery disease with patent LIMA to LAD. The SVG to the RCA is now occluded proximally with left-to-right collaterals. The left circumflex stent is patent with no significant restenosis. 2. Mildly reduced LV systolic function with an EF of 50-55% and inferior wall hypokinesis. Mildly elevated left ventricular end-diastolic pressure. 3. Aortic arch angiogram was done to evaluate the stenosis in the left subclavian artery. It showed 60% heavily calcified stenosis in the ostium of the left subclavian artery. There was about 65-99 mm systolic gradient across the stenosis.  PMH:   has a past medical history of AAA (abdominal aortic aneurysm) ([place]) (01/2009), Acute bronchitis (03/20/2013; 2017), Angina, Anosmia, Asthma, Baker's cyst of knee (01/30/2014), Basal cell carcinoma, Bradycardia, CAD (coronary artery disease), Cerebrovascular disease (01/2009), Chronic thoracic back pain, COPD (chronic obstructive pulmonary disease) ([place]), Depression, Dizziness, Dysrhythmia, GERD (gastroesophageal reflux disease), Heart murmur, Herniated lumbar disc without myelopathy, History of blood transfusion (1999), Hyperglycemia (10/23/2015), Hyperlipidemia, Hypertension, Hypothyroidism (09/30/2016), Medicare annual wellness visit, subsequent (07/31/2013), NSTEMI (non-ST elevated myocardial infarction) ([place]) (11/12), Osteoarthritis of back, Osteoporosis, Pneumonia ("several times"), Pulmonary embolism ([place]) (1999), PVD (peripheral vascular disease) ([place]), Shortness of breath, Squamous carcinoma, Thyroid disease, and  Unstable angina ([place]) (09/25/2015).  PSH:    Past Surgical History:  Procedure Laterality Date  . ABDOMINAL AORTIC ANEURYSM REPAIR     pt denies this hx on 01/02/2016  . BASAL CELL CARCINOMA EXCISION    . CARDIAC CATHETERIZATION N/A 01/02/2016   Procedure: Left Heart Cath and Coronary Angiography;  Surgeon: Wellington Hampshire, MD;  Location: [facility];  Service: Cardiovascular;  Laterality: N/A;  . CARDIAC CATHETERIZATION  1996; 1999  . CARDIAC CATHETERIZATION N/A 06/25/2016   Procedure: Left Heart Cath and Cors/Grafts Angiography;  Surgeon: Wellington Hampshire, MD;  Location: [facility];  Service: Cardiovascular;  Laterality: N/A;  . CAROTID ENDARTERECTOMY Left 01/02/2011  . CATARACT EXTRACTION W/ INTRAOCULAR LENS  IMPLANT, BILATERAL    . CLOSED REDUCTION NASAL FRACTURE  11/2007  . CORONARY ANGIOPLASTY WITH STENT PLACEMENT  10/10/2011   drug eluting  to rc & saphenous  . CORONARY ARTERY BYPASS GRAFT  1999   "CABG X3"  . DILATION AND CURETTAGE OF UTERUS    . FEMORAL ARTERY STENT Bilateral   . FRACTURE SURGERY    . LEFT HEART CATHETERIZATION WITH CORONARY/GRAFT ANGIOGRAM N/A 04/22/2011   Procedure: LEFT HEART CATHETERIZATION WITH Beatrix Fetters;  Surgeon: Jolaine Artist, MD;  [facility];  Service: Cardiovascular;  Laterality: N/A;  . LEFT HEART CATHETERIZATION WITH CORONARY/GRAFT ANGIOGRAM N/A 10/10/2011   Procedure: LEFT HEART CATHETERIZATION WITH Beatrix Fetters;  Surgeon: Peter M Martinique, MD;  Location: [facility] CATH LAB;  Service: Cardiovascular;  Laterality: N/A;  . NASAL SINUS SURGERY     twice  . SQUAMOUS CELL CARCINOMA EXCISION    . SVT ABLATION N/A 03/19/2018   Procedure: SVT ABLATION;  Surgeon: Constance Haw, MD;  Location: [facility];  Service: Cardiovascular;  Laterality: N/A;  . TUBAL LIGATION      Current Outpatient Medications  Medication Sig Dispense Refill  . acetaminophen (TYLENOL) 650 MG CR tablet Take 1,300 mg by  mouth 2 (two) times daily.    Marland Kitchen albuterol (PROVENTIL) (2.5 MG/3ML) 0.083% nebulizer solution Take 3 mLs (2.5 mg total) by nebulization every 4 (four) hours as needed for wheezing or shortness of breath. Dx:J44.9 75 mL 12  . albuterol (VENTOLIN HFA) 108 (90 Base) MCG/ACT inhaler INHALE 2 PUFFS INTO THE LUNGS EVERY 6 (SIX) HOURS AS NEEDED FOR WHEEZING. (Patient taking differently: Inhale 2 puffs into the lungs every 6 (six) hours as needed for wheezing or shortness of breath. ) 18 g 5  . amLODipine (NORVASC) 5 MG tablet TAKE 1 TABLET BY MOUTH ONCE DAILY (Patient taking differently: Take 5 mg by mouth daily. ) 90 tablet 3  . azelastine (ASTELIN) 0.1 % nasal spray Place 2 sprays into both nostrils at bedtime as needed for rhinitis. 30 mL 3  . Biotin 5 MG TABS Take 5 mg by mouth daily.     . Calcium Carbonate-Vitamin D (CALCIUM + D PO) Take 1 tablet by mouth daily.     . Cholecalciferol (VITAMIN D3) 2000 units TABS Take 2,000 Units by mouth daily.     . clopidogrel (PLAVIX) 75 MG tablet TAKE 1 TABLET BY MOUTH ONCE A DAY (Patient taking differently: Take 75 mg by mouth daily. ) 90 tablet 0  . diltiazem (CARDIZEM CD) 240 MG 24 hr capsule Take 1 capsule (240 mg total) by mouth daily. Pt must keep appt in May to continue receiving refills 90 capsule 0  . diltiazem (CARDIZEM) 30 MG tablet Take one tablet by  mouth every 4 hours AS NEEDED for A-fib HR > 100 as long as BP > 100. (Patient taking differently: Take 30 mg by mouth every 4 (four) hours as needed (aFib [HR >100 and if BP >100]). ) 45 tablet 0  . ezetimibe (ZETIA) 10 MG tablet TAKE 1 TABLET BY MOUTH DAILY (Patient taking differently: Take 10 mg by mouth at bedtime. ) 90 tablet 0  . furosemide (LASIX) 20 MG tablet Take 1 tablet (20 mg total) by mouth daily. 30 tablet 3  . HYDROcodone-acetaminophen (NORCO) 10-325 MG tablet Take 1 tablet by mouth every 6 (six) hours as needed for severe pain.    . isosorbide mononitrate (IMDUR) 60 MG 24 hr tablet TAKE 1  TABLET BY MOUTH TWICE A DAY (Patient taking differently: Take 60 mg by mouth 2 (two) times daily. ) 180 tablet 3  . Krill Oil 500 MG CAPS Take 500 mg by mouth daily.     Marland Kitchen levothyroxine (SYNTHROID) 50 MCG tablet TAKE 1 TABLET BY MOUTH ONCE DAILY (Patient taking differently: Take 50 mcg by mouth daily before breakfast. ) 90 tablet 3  . nitroGLYCERIN (NITROSTAT) 0.4 MG SL tablet Place 1 tablet (0.4 mg total) under the tongue every 5 (five) minutes as needed for chest pain. 25 tablet 0  . omeprazole (PRILOSEC) 20 MG capsule TAKE 1 CAPSULE BY MOUTH ONCE DAILY 30 capsule 5  . Polyethyl Glycol-Propyl Glycol (SYSTANE OP) Place 1 drop into both eyes 2 (two) times a day.     . rivaroxaban (XARELTO) 20 MG TABS tablet Take 1 tablet (20 mg total) by mouth daily with supper. 30 tablet 6  . rosuvastatin (CRESTOR) 40 MG tablet TAKE 1 TABLET BY MOUTH ONCE DAILY 90 tablet 0  . sertraline (ZOLOFT) 100 MG tablet Take 1 tablet (100 mg total) by mouth daily. 90 tablet 3  . STIOLTO RESPIMAT 2.5-2.5 MCG/ACT AERS INHALE 2 PUFFS INTO THE LUNGS ONCE DAILY (Patient taking differently: Inhale 2 puffs into the lungs daily. ) 4 g 10  . Vitamin D, Ergocalciferol, (DRISDOL) 1.25 MG (50000 UNIT) CAPS capsule Take 1 capsule (50,000 Units total) by mouth once a week. 12 capsule 1  . zinc gluconate 50 MG tablet Take 50 mg by mouth daily.     No current facility-administered medications for this visit.     Allergies:   Erythromycin, Meperidine, Shellfish allergy, Ciprofloxacin, Codeine, Penicillins, and Tape   Social History:  The patient  reports that she quit smoking about 26 years ago. Her smoking use included cigarettes. She has a 20.00 pack-year smoking history. She has never used smokeless tobacco. She reports current alcohol use. She reports that she does not use drugs.   Family History:   family history includes Alcohol abuse in her brother; Aneurysm in her brother; Angina in her father; Cirrhosis in her maternal  grandfather; Colon cancer (age of onset: 15) in her father; Diabetes in her maternal grandmother and mother; Heart attack in her brother and brother; Heart attack (age of onset: 37) in her mother; Heart disease in her brother, brother, and father; Hyperlipidemia in her daughter; Hypertension in her brother and father; Hypothyroidism in her sister; Lung cancer in her father and sister; Obesity in her daughter and son; Stroke in her maternal grandmother and paternal grandmother.    Review of Systems: Review of Systems  Constitutional: Positive for malaise/fatigue.  HENT: Negative.   Respiratory: Positive for shortness of breath.   Cardiovascular: Negative.   Gastrointestinal: Negative.   Musculoskeletal: Negative.  Neurological: Negative.   Psychiatric/Behavioral: Negative.   All other systems reviewed and are negative.   PHYSICAL EXAM: VS:  BP 120/66 (BP Location: Right Arm, Patient Position: Sitting, Cuff Size: Normal)   Pulse 79   Ht 5\' 2"  (1.575 m)   Wt 148 lb (67.1 kg)   SpO2 96%   BMI 27.07 kg/m  , BMI Body mass index is 27.07 kg/m. Constitutional:  oriented to person, place, and time. No distress.  HENT:  Head: Grossly normal Eyes:  no discharge. No scleral icterus.  Neck: No JVD, no carotid bruits  Cardiovascular:irreg irregno murmurs appreciated Pulmonary/Chest: Clear to auscultation bilaterally, no wheezes or rails Abdominal: Soft.  no distension.  no tenderness.  Musculoskeletal: Normal range of motion Neurological:  normal muscle tone. Coordination normal. No atrophy Skin: Skin warm and dry Psychiatric: normal affect, pleasant  Recent Labs: 03/22/2019: NT-Pro BNP 567 06/13/2019: ALT 9; TSH 1.98 08/16/2019: B Natriuretic Peptide 486.0 10/07/2019: BUN 13; Creatinine, Ser 0.98; Hemoglobin 14.8; Platelets 215; Potassium 4.1; Sodium 143    Lipid Panel Lab Results  Component Value Date   CHOL 119 06/13/2019   HDL 32.10 (L) 06/13/2019   LDLCALC 78 12/01/2017   TRIG  217.0 (H) 06/13/2019      Wt Readings from Last 3 Encounters:  10/11/19 148 lb (67.1 kg)  10/07/19 146 lb (66.2 kg)  09/22/19 149 lb (67.6 kg)     ASSESSMENT AND PLAN:  COPD exacerbation Stable today  Mixed hyperlipidemia Zetia Crestor Numbers are great  Essential hypertension Blood pressure is well controlled on today's visit. No changes made to the medications.  SVT Followed by  EP in [place] Denies having significant symptoms since diltiazem was increased recently  Coronary artery disease involving coronary bypass graft of native heart with angina pectoris [facility])  Currently with no symptoms of angina. No further workup at this time. Continue current medication regimen.  Bilateral carotid artery stenosis Endarterectomy on the left, <39% b/l, Last seen 2019 Continue aggressive lipid management Repeat u/s later this year  Pulmonary HTN underlying lung disease, 30 years of smoking Prior echo with mild to moderate pulmonary hypertension Lasix daily,. Extra in the afternoon  Non-rheumatic tricuspid valve insufficiency Moderate TR on echo   Disposition:   F/U  12 months   Total encounter time more than 25 minutes  Greater than 50% was spent in counseling and coordination of care with the patient    No orders of the defined types were placed in this encounter.    Signed, Esmond Plants, M.D., Ph.D. 10/11/2019  [place], [place]

## 2019-10-17 ENCOUNTER — Other Ambulatory Visit: Payer: Self-pay

## 2019-10-17 ENCOUNTER — Other Ambulatory Visit
Admission: RE | Admit: 2019-10-17 | Discharge: 2019-10-17 | Disposition: A | Discharge status: Home / Self Care | Payer: Medicare HMO | Source: Ambulatory Visit | Attending: Cardiology | Admitting: Cardiology

## 2019-10-17 DIAGNOSIS — Z01812 Encounter for preprocedural laboratory examination: Secondary | ICD-10-CM | POA: Insufficient documentation

## 2019-10-17 DIAGNOSIS — Z20822 Contact with and (suspected) exposure to covid-19: Secondary | ICD-10-CM | POA: Insufficient documentation

## 2019-10-17 LAB — SARS CORONAVIRUS 2 (TAT 6-24 HRS): SARS Coronavirus 2: NEGATIVE

## 2019-10-18 DIAGNOSIS — G4733 Obstructive sleep apnea (adult) (pediatric): Secondary | ICD-10-CM | POA: Diagnosis not present

## 2019-10-19 ENCOUNTER — Ambulatory Visit (HOSPITAL_COMMUNITY)
Admission: RE | Admit: 2019-10-19 | Discharge: 2019-10-19 | Disposition: A | Discharge status: Home / Self Care | Payer: Medicare HMO | Attending: Cardiology | Admitting: Cardiology

## 2019-10-19 ENCOUNTER — Encounter (HOSPITAL_COMMUNITY): Payer: Self-pay | Admitting: Cardiology

## 2019-10-19 ENCOUNTER — Ambulatory Visit (HOSPITAL_COMMUNITY): Payer: Medicare HMO | Admitting: Certified Registered Nurse Anesthetist

## 2019-10-19 ENCOUNTER — Other Ambulatory Visit: Payer: Self-pay

## 2019-10-19 ENCOUNTER — Encounter (HOSPITAL_COMMUNITY)
Admission: RE | Disposition: A | Discharge status: Home / Self Care | Payer: Medicare HMO | Source: Home / Self Care | Attending: Cardiology

## 2019-10-19 DIAGNOSIS — I714 Abdominal aortic aneurysm, without rupture: Secondary | ICD-10-CM | POA: Insufficient documentation

## 2019-10-19 DIAGNOSIS — I6523 Occlusion and stenosis of bilateral carotid arteries: Secondary | ICD-10-CM | POA: Diagnosis not present

## 2019-10-19 DIAGNOSIS — Z79899 Other long term (current) drug therapy: Secondary | ICD-10-CM | POA: Insufficient documentation

## 2019-10-19 DIAGNOSIS — Z7901 Long term (current) use of anticoagulants: Secondary | ICD-10-CM | POA: Diagnosis not present

## 2019-10-19 DIAGNOSIS — Z885 Allergy status to narcotic agent status: Secondary | ICD-10-CM | POA: Diagnosis not present

## 2019-10-19 DIAGNOSIS — Z8349 Family history of other endocrine, nutritional and metabolic diseases: Secondary | ICD-10-CM | POA: Insufficient documentation

## 2019-10-19 DIAGNOSIS — Z881 Allergy status to other antibiotic agents status: Secondary | ICD-10-CM | POA: Insufficient documentation

## 2019-10-19 DIAGNOSIS — I252 Old myocardial infarction: Secondary | ICD-10-CM | POA: Insufficient documentation

## 2019-10-19 DIAGNOSIS — J449 Chronic obstructive pulmonary disease, unspecified: Secondary | ICD-10-CM | POA: Diagnosis not present

## 2019-10-19 DIAGNOSIS — M1990 Unspecified osteoarthritis, unspecified site: Secondary | ICD-10-CM | POA: Insufficient documentation

## 2019-10-19 DIAGNOSIS — Z7902 Long term (current) use of antithrombotics/antiplatelets: Secondary | ICD-10-CM | POA: Insufficient documentation

## 2019-10-19 DIAGNOSIS — I739 Peripheral vascular disease, unspecified: Secondary | ICD-10-CM | POA: Diagnosis not present

## 2019-10-19 DIAGNOSIS — Z888 Allergy status to other drugs, medicaments and biological substances status: Secondary | ICD-10-CM | POA: Insufficient documentation

## 2019-10-19 DIAGNOSIS — Z951 Presence of aortocoronary bypass graft: Secondary | ICD-10-CM | POA: Diagnosis not present

## 2019-10-19 DIAGNOSIS — I2510 Atherosclerotic heart disease of native coronary artery without angina pectoris: Secondary | ICD-10-CM | POA: Insufficient documentation

## 2019-10-19 DIAGNOSIS — I480 Paroxysmal atrial fibrillation: Secondary | ICD-10-CM | POA: Diagnosis not present

## 2019-10-19 DIAGNOSIS — Z86711 Personal history of pulmonary embolism: Secondary | ICD-10-CM | POA: Diagnosis not present

## 2019-10-19 DIAGNOSIS — G4733 Obstructive sleep apnea (adult) (pediatric): Secondary | ICD-10-CM | POA: Diagnosis not present

## 2019-10-19 DIAGNOSIS — Z79890 Hormone replacement therapy (postmenopausal): Secondary | ICD-10-CM | POA: Insufficient documentation

## 2019-10-19 DIAGNOSIS — Z880 Allergy status to penicillin: Secondary | ICD-10-CM | POA: Insufficient documentation

## 2019-10-19 DIAGNOSIS — I4819 Other persistent atrial fibrillation: Secondary | ICD-10-CM | POA: Insufficient documentation

## 2019-10-19 DIAGNOSIS — E785 Hyperlipidemia, unspecified: Secondary | ICD-10-CM | POA: Diagnosis not present

## 2019-10-19 DIAGNOSIS — Z87891 Personal history of nicotine dependence: Secondary | ICD-10-CM | POA: Diagnosis not present

## 2019-10-19 DIAGNOSIS — K219 Gastro-esophageal reflux disease without esophagitis: Secondary | ICD-10-CM | POA: Insufficient documentation

## 2019-10-19 DIAGNOSIS — E039 Hypothyroidism, unspecified: Secondary | ICD-10-CM | POA: Insufficient documentation

## 2019-10-19 DIAGNOSIS — I214 Non-ST elevation (NSTEMI) myocardial infarction: Secondary | ICD-10-CM | POA: Diagnosis not present

## 2019-10-19 DIAGNOSIS — I10 Essential (primary) hypertension: Secondary | ICD-10-CM | POA: Diagnosis not present

## 2019-10-19 DIAGNOSIS — Z8249 Family history of ischemic heart disease and other diseases of the circulatory system: Secondary | ICD-10-CM | POA: Insufficient documentation

## 2019-10-19 DIAGNOSIS — E876 Hypokalemia: Secondary | ICD-10-CM | POA: Diagnosis not present

## 2019-10-19 DIAGNOSIS — I482 Chronic atrial fibrillation, unspecified: Secondary | ICD-10-CM | POA: Diagnosis not present

## 2019-10-19 HISTORY — PX: CARDIOVERSION: SHX1299

## 2019-10-19 SURGERY — CARDIOVERSION
Anesthesia: General

## 2019-10-19 MED ORDER — PROPOFOL 10 MG/ML IV BOLUS
INTRAVENOUS | Status: DC | PRN
  Administered 2019-10-19: 80 mg via INTRAVENOUS

## 2019-10-19 MED ORDER — LIDOCAINE HCL (CARDIAC) PF 100 MG/5ML IV SOSY
PREFILLED_SYRINGE | INTRAVENOUS | Status: DC | PRN
  Administered 2019-10-19: 40 mg via INTRAVENOUS

## 2019-10-19 MED ORDER — SODIUM CHLORIDE 0.9 % IV SOLN
INTRAVENOUS | Status: DC | PRN
Start: 2019-10-19 — End: 2019-10-19

## 2019-10-19 NOTE — Interval H&P Note (Signed)
History and Physical Interval Note:  10/19/2019 8:13 AM  Andrea [name]  has presented today for surgery, with the diagnosis of AFIB.  The various methods of treatment have been discussed with the patient and family. After consideration of risks, benefits and other options for treatment, the patient has consented to  Procedure(s): CARDIOVERSION (N/A) as a surgical intervention.  The patient's history has been reviewed, patient examined, no change in status, stable for surgery.  I have reviewed the patient's chart and labs.  Questions were answered to the patient's satisfaction.     [employer]

## 2019-10-19 NOTE — CV Procedure (Signed)
    Electrical Cardioversion Procedure Note Andrea [name] [id number] 10/12/1940  Procedure: Electrical Cardioversion Indications:  Atrial Fibrillation  Time Out: Verified patient identification, verified procedure,medications/allergies/relevent history reviewed, required imaging and test results available.  Performed  Procedure Details  The patient was NPO after midnight. Anesthesia was administered at the beside  by Dr. Nyoka Cowden with 80mg  of propofol.  Cardioversion was performed with synchronized biphasic defibrillation via AP pads with 200 joules.  1 attempt(s) were performed.  The patient converted to normal sinus rhythm. The patient tolerated the procedure well   IMPRESSION:  Successful cardioversion of atrial fibrillation    Andrea [name] 10/19/2019, 9:09 AM

## 2019-10-19 NOTE — Transfer of Care (Signed)
Immediate Anesthesia Transfer of Care Note  Patient: Andrea [name]  Procedure(s) Performed: CARDIOVERSION (N/A )  Patient Location: PACU and Endoscopy Unit  Anesthesia Type:General  Level of Consciousness: awake, patient cooperative and responds to stimulation  Airway & Oxygen Therapy: Patient Spontanous Breathing  Post-op Assessment: Report given to RN and Post -op Vital signs reviewed and stable  Post vital signs: Reviewed and stable  Last Vitals:  Vitals Value Taken Time  BP    Temp    Pulse    Resp    SpO2      Last Pain:  Vitals:   10/19/19 0821  TempSrc: Oral  PainSc: 0-No pain         Complications: No apparent anesthesia complications

## 2019-10-19 NOTE — Anesthesia Postprocedure Evaluation (Signed)
Anesthesia Post Note  Patient: Andrea [name]  Procedure(s) Performed: CARDIOVERSION (N/A )     Patient location during evaluation: Endoscopy Anesthesia Type: General Level of consciousness: awake Pain management: pain level controlled Vital Signs Assessment: post-procedure vital signs reviewed and stable Respiratory status: spontaneous breathing Cardiovascular status: stable Postop Assessment: no apparent nausea or vomiting Anesthetic complications: no    Last Vitals:  Vitals:   10/19/19 0920 10/19/19 0930  BP: 118/64 (!) 143/81  Pulse: 68   Resp: 14 19  Temp:    SpO2: 92% 94%    Last Pain:  Vitals:   10/19/19 0930  TempSrc:   PainSc: 0-No pain                 Jonette Wassel

## 2019-10-19 NOTE — Discharge Instructions (Signed)
Electrical Cardioversion Electrical cardioversion is the delivery of a jolt of electricity to restore a normal rhythm to the heart. A rhythm that is too fast or is not regular keeps the heart from pumping well. In this procedure, sticky patches or metal paddles are placed on the chest to deliver electricity to the heart from a device. This procedure may be done in an emergency if:  There is low or no blood pressure as a result of the heart rhythm.  Normal rhythm must be restored as fast as possible to protect the brain and heart from further damage.  It may save a life. This may also be a scheduled procedure for irregular or fast heart rhythms that are not immediately life-threatening. Tell a health care provider about:  Any allergies you have.  All medicines you are taking, including vitamins, herbs, eye drops, creams, and over-the-counter medicines.  Any problems you or family members have had with anesthetic medicines.  Any blood disorders you have.  Any surgeries you have had.  Any medical conditions you have.  Whether you are pregnant or may be pregnant. What are the risks? Generally, this is a safe procedure. However, problems may occur, including:  Allergic reactions to medicines.  A blood clot that breaks free and travels to other parts of your body.  The possible return of an abnormal heart rhythm within hours or days after the procedure.  Your heart stopping (cardiac arrest). This is rare. What happens before the procedure? Medicines  Your health care provider may have you start taking: ? Blood-thinning medicines (anticoagulants) so your blood does not clot as easily. ? Medicines to help stabilize your heart rate and rhythm.  Ask your health care provider about: ? Changing or stopping your regular medicines. This is especially important if you are taking diabetes medicines or blood thinners. ? Taking medicines such as aspirin and ibuprofen. These medicines can  thin your blood. Do not take these medicines unless your health care provider tells you to take them. ? Taking over-the-counter medicines, vitamins, herbs, and supplements. General instructions  Follow instructions from your health care provider about eating or drinking restrictions.  Plan to have someone take you home from the hospital or clinic.  If you will be going home right after the procedure, plan to have someone with you for 24 hours.  Ask your health care provider what steps will be taken to help prevent infection. These may include washing your skin with a germ-killing soap. What happens during the procedure?   An IV will be inserted into one of your veins.  Sticky patches (electrodes) or metal paddles may be placed on your chest.  You will be given a medicine to help you relax (sedative).  An electrical shock will be delivered. The procedure may vary among health care providers and hospitals. What can I expect after the procedure?  Your blood pressure, heart rate, breathing rate, and blood oxygen level will be monitored until you leave the hospital or clinic.  Your heart rhythm will be watched to make sure it does not change.  You may have some redness on the skin where the shocks were given. Follow these instructions at home:  Do not drive for 24 hours if you were given a sedative during your procedure.  Take over-the-counter and prescription medicines only as told by your health care provider.  Ask your health care provider how to check your pulse. Check it often.  Rest for 48 hours after the procedure or   as told by your health care provider.  Avoid or limit your caffeine use as told by your health care provider.  Keep all follow-up visits as told by your health care provider. This is important. Contact a health care provider if:  You feel like your heart is beating too quickly or your pulse is not regular.  You have a serious muscle cramp that does not go  away. Get help right away if:  You have discomfort in your chest.  You are dizzy or you feel faint.  You have trouble breathing or you are short of breath.  Your speech is slurred.  You have trouble moving an arm or leg on one side of your body.  Your fingers or toes turn cold or blue. Summary  Electrical cardioversion is the delivery of a jolt of electricity to restore a normal rhythm to the heart.  This procedure may be done right away in an emergency or may be a scheduled procedure if the condition is not an emergency.  Generally, this is a safe procedure.  After the procedure, check your pulse often as told by your health care provider. This information is not intended to replace advice given to you by your health care provider. Make sure you discuss any questions you have with your health care provider. Document Revised: 12/13/2018 Document Reviewed: 12/13/2018 Elsevier Patient Education  2020 [employer]  

## 2019-10-19 NOTE — Anesthesia Preprocedure Evaluation (Signed)
Anesthesia Evaluation  Patient identified by MRN, date of birth, ID band Patient awake    Reviewed: Allergy & Precautions  Airway Mallampati: II       Dental   Pulmonary former smoker,    breath sounds clear to auscultation       Cardiovascular hypertension, + angina + Past MI, + Peripheral Vascular Disease and +CHF  (-) DVT + dysrhythmias + Valvular Problems/Murmurs  Rhythm:Irregular Rate:Normal     Neuro/Psych    GI/Hepatic Neg liver ROS, GERD  ,  Endo/Other  Hypothyroidism   Renal/GU negative Renal ROS     Musculoskeletal   Abdominal   Peds  Hematology   Anesthesia Other Findings   Reproductive/Obstetrics                             Anesthesia Physical Anesthesia Plan  ASA: III  Anesthesia Plan: General   Post-op Pain Management:    Induction: Intravenous  PONV Risk Score and Plan: Treatment may vary due to age or medical condition  Airway Management Planned: Simple Face Mask and LMA  Additional Equipment:   Intra-op Plan:   Post-operative Plan:   Informed Consent: I have reviewed the patients History and Physical, chart, labs and discussed the procedure including the risks, benefits and alternatives for the proposed anesthesia with the patient or authorized representative who has indicated his/her understanding and acceptance.     Dental advisory given  Plan Discussed with: Anesthesiologist and CRNA  Anesthesia Plan Comments:         Anesthesia Quick Evaluation

## 2019-10-20 ENCOUNTER — Ambulatory Visit: Payer: Medicare HMO | Admitting: Emergency Medicine

## 2019-10-20 LAB — POCT I-STAT, CHEM 8
BUN: 13 mg/dL (ref 8–23)
Calcium, Ion: 1.16 mmol/L (ref 1.15–1.40)
Chloride: 102 mmol/L (ref 98–111)
Creatinine, Ser: 0.8 mg/dL (ref 0.44–1.00)
Glucose, Bld: 106 mg/dL — ABNORMAL HIGH (ref 70–99)
HCT: 43 % (ref 36.0–46.0)
Hemoglobin: 14.6 g/dL (ref 12.0–15.0)
Potassium: 3.6 mmol/L (ref 3.5–5.1)
Sodium: 142 mmol/L (ref 135–145)
TCO2: 30 mmol/L (ref 22–32)

## 2019-10-22 ENCOUNTER — Other Ambulatory Visit: Payer: Self-pay | Admitting: Cardiovascular Disease

## 2019-10-22 DIAGNOSIS — J449 Chronic obstructive pulmonary disease, unspecified: Secondary | ICD-10-CM | POA: Diagnosis not present

## 2019-10-26 ENCOUNTER — Ambulatory Visit (HOSPITAL_COMMUNITY): Payer: Medicare HMO | Admitting: Physician Assistant

## 2019-10-26 DIAGNOSIS — L820 Inflamed seborrheic keratosis: Secondary | ICD-10-CM | POA: Diagnosis not present

## 2019-10-26 DIAGNOSIS — L570 Actinic keratosis: Secondary | ICD-10-CM | POA: Diagnosis not present

## 2019-10-26 DIAGNOSIS — L538 Other specified erythematous conditions: Secondary | ICD-10-CM | POA: Diagnosis not present

## 2019-10-26 DIAGNOSIS — C44519 Basal cell carcinoma of skin of other part of trunk: Secondary | ICD-10-CM | POA: Diagnosis not present

## 2019-10-26 DIAGNOSIS — L298 Other pruritus: Secondary | ICD-10-CM | POA: Diagnosis not present

## 2019-10-27 ENCOUNTER — Other Ambulatory Visit: Payer: Self-pay

## 2019-10-27 ENCOUNTER — Encounter (HOSPITAL_COMMUNITY): Payer: Self-pay | Admitting: Physician Assistant

## 2019-10-27 ENCOUNTER — Ambulatory Visit (HOSPITAL_COMMUNITY)
Admission: RE | Admit: 2019-10-27 | Discharge: 2019-10-27 | Disposition: A | Discharge status: Home / Self Care | Payer: Medicare HMO | Source: Ambulatory Visit | Attending: Physician Assistant | Admitting: Physician Assistant

## 2019-10-27 VITALS — BP 130/74 | HR 79 | Ht 62.0 in | Wt 145.8 lb

## 2019-10-27 DIAGNOSIS — Z86711 Personal history of pulmonary embolism: Secondary | ICD-10-CM | POA: Diagnosis not present

## 2019-10-27 DIAGNOSIS — Z87891 Personal history of nicotine dependence: Secondary | ICD-10-CM | POA: Diagnosis not present

## 2019-10-27 DIAGNOSIS — D6869 Other thrombophilia: Secondary | ICD-10-CM | POA: Diagnosis not present

## 2019-10-27 DIAGNOSIS — R011 Cardiac murmur, unspecified: Secondary | ICD-10-CM | POA: Insufficient documentation

## 2019-10-27 DIAGNOSIS — E039 Hypothyroidism, unspecified: Secondary | ICD-10-CM | POA: Diagnosis not present

## 2019-10-27 DIAGNOSIS — I10 Essential (primary) hypertension: Secondary | ICD-10-CM | POA: Insufficient documentation

## 2019-10-27 DIAGNOSIS — Z951 Presence of aortocoronary bypass graft: Secondary | ICD-10-CM | POA: Insufficient documentation

## 2019-10-27 DIAGNOSIS — G4733 Obstructive sleep apnea (adult) (pediatric): Secondary | ICD-10-CM | POA: Diagnosis not present

## 2019-10-27 DIAGNOSIS — I252 Old myocardial infarction: Secondary | ICD-10-CM | POA: Insufficient documentation

## 2019-10-27 DIAGNOSIS — Z7901 Long term (current) use of anticoagulants: Secondary | ICD-10-CM | POA: Diagnosis not present

## 2019-10-27 DIAGNOSIS — Z8249 Family history of ischemic heart disease and other diseases of the circulatory system: Secondary | ICD-10-CM | POA: Diagnosis not present

## 2019-10-27 DIAGNOSIS — Z7902 Long term (current) use of antithrombotics/antiplatelets: Secondary | ICD-10-CM | POA: Diagnosis not present

## 2019-10-27 DIAGNOSIS — I2510 Atherosclerotic heart disease of native coronary artery without angina pectoris: Secondary | ICD-10-CM | POA: Insufficient documentation

## 2019-10-27 DIAGNOSIS — K219 Gastro-esophageal reflux disease without esophagitis: Secondary | ICD-10-CM | POA: Diagnosis not present

## 2019-10-27 DIAGNOSIS — J449 Chronic obstructive pulmonary disease, unspecified: Secondary | ICD-10-CM | POA: Insufficient documentation

## 2019-10-27 DIAGNOSIS — E785 Hyperlipidemia, unspecified: Secondary | ICD-10-CM | POA: Diagnosis not present

## 2019-10-27 DIAGNOSIS — Z79899 Other long term (current) drug therapy: Secondary | ICD-10-CM | POA: Diagnosis not present

## 2019-10-27 DIAGNOSIS — I4819 Other persistent atrial fibrillation: Secondary | ICD-10-CM | POA: Diagnosis not present

## 2019-10-27 NOTE — Progress Notes (Signed)
Primary Care Physician: Mosie Lukes, MD Primary Cardiologist: Dr Rockey Situ Primary Electrophysiologist: Dr Curt Bears Referring Physician: [facility]   Andrea [name] is a 79 y.o. female with a history ofcoronary disease status post CABG in 1999 status post PCI to the distal RCA in 2013, PCI x2 to the SVG to the RCA into the mid circumflex in 2017, PVD status post lower extremity stenting, carotid artery disease status post left-sided CEA, pulmonary hypertension, valvular heart disease, COPD, infrarenal AAA, PE, hypertension, hyperlipidemia, asthma, hypothyroidism, SVT/atrial tach, and persistent atrial fibrillation who presents for follow up in the [facility].  The patient was initially diagnosed with atrial fibrillation on 11/07/18 after presenting to the ER with symptoms of palpitations, dyspnea, and chest discomfort. At the ER, she was found to be in rate controlled afib. She was started on Xarelto. Patient was hospitalized for a COPD exacerbation 07/2019 and was found to be in afib at that time. Echo 08/16/19 showed preserved EF with mod dilated LA. She saw Dr Curt Bears on 09/22/19 and DCCV was recommended.   On follow up today, patient is s/p DCCV 10/19/19. She is in SR today. She admits she does not feel much different in SR vs afib. Her Apple Watch has shown SR with PACs. She denies bleeding on anticoagulation.   Today, she denies symptoms of palpitations, chest pain, orthopnea, PND, dizziness, presyncope, syncope, bleeding, or neurologic sequela. The patient is tolerating medications without difficulties and is otherwise without complaint today.    Atrial Fibrillation Risk Factors:  she does have symptoms or diagnosis of sleep apnea. she does not have a history of rheumatic fever. she does not have a history of alcohol use. The patient does not have a history of early familial atrial fibrillation or other arrhythmias.  she has a BMI of Body mass index is 26.67  kg/m.Marland Kitchen Filed Weights   10/27/19 1134  Weight: 66.1 kg    Family History  Problem Relation Age of Onset  . Heart attack Mother 34  . Diabetes Mother   . Colon cancer Father 20  . Lung cancer Father        smoked  . Heart disease Father   . Hypertension Father   . Angina Father   . Lung cancer Sister        smoked  . Hypothyroidism Sister   . Hypertension Brother   . Heart attack Brother   . Heart disease Brother        PTCA with Stent  . Heart attack Brother        CABG  . Heart disease Brother        CABG with 1 bypass  . Aneurysm Brother        brain  . Alcohol abuse Brother   . Hyperlipidemia Daughter   . Diabetes Maternal Grandmother   . Stroke Maternal Grandmother   . Cirrhosis Maternal Grandfather   . Stroke Paternal Grandmother   . Obesity Daughter   . Obesity Son      Atrial Fibrillation Management history:  Previous antiarrhythmic drugs: Multaq (stopped 2/2 side effects) Previous cardioversions: 10/19/19 Previous ablations: EP study 03/19/18 (tachy not inducible. SVT) CHADS2VASC score: 5 Anticoagulation history: Xarelto    Past Medical History:  Diagnosis Date  . AAA (abdominal aortic aneurysm) ([place]) 01/2009   AAA (2.8 x 3.0)  moderate RAS (left); 2.7 x 2.7 cm (07/11/05)  . Acute bronchitis 03/20/2013; 2017  . Angina   . Anosmia   .  Asthma   . Baker's cyst of knee 01/30/2014  . Basal cell carcinoma    "back and left arm"  . Bradycardia    Metoprolol stopped 08/2011  . CAD (coronary artery disease)    s/CABG (reports IMA and 2 SVGs) back in 1999  Myoview normal 3/10; s/p PCI with DES to PL branch of distal RCA 09/2011; PCI +DES to SVG-RCA, PCI + DES to mid LCx 12/2015  . Cerebrovascular disease 01/2009   carotid u/s  R 0-39%   L 60-79%  . Chronic thoracic back pain   . COPD (chronic obstructive pulmonary disease) ([place])   . Depression   . Dizziness   . Dysrhythmia    hx of sinus brady  . GERD (gastroesophageal reflux disease)   . Heart murmur     . Herniated lumbar disc without myelopathy   . History of blood transfusion 1999   "when I had the bypass; had a PE"  . Hyperglycemia 10/23/2015  . Hyperlipidemia   . Hypertension   . Hypothyroidism 09/30/2016  . Medicare annual wellness visit, subsequent 07/31/2013  . NSTEMI (non-ST elevated myocardial infarction) ([place]) 11/12   Cath showed atretic IMA graft to the LAD, SVG to PD was patent but the continuation of this graft to the PL branch was occluded; there were L to R collaterals; Mid LAD had a 60 to 70% stenosis. She has been treated medically.  Neg Myoview 05/2011  . Osteoarthritis of back   . Osteoporosis   . Pneumonia "several times"  . Pulmonary embolism ([place]) 1999   "after my bypass"  . PVD (peripheral vascular disease) ([place])   . Shortness of breath   . Squamous carcinoma    "nose"  . Thyroid disease    Hypothyroid  . Unstable angina ([place]) 09/25/2015   Past Surgical History:  Procedure Laterality Date  . ABDOMINAL AORTIC ANEURYSM REPAIR     pt denies this hx on 01/02/2016  . BASAL CELL CARCINOMA EXCISION    . CARDIAC CATHETERIZATION N/A 01/02/2016   Procedure: Left Heart Cath and Coronary Angiography;  Surgeon: Wellington Hampshire, MD;  Location: [place] [facility];  Service: Cardiovascular;  Laterality: N/A;  . CARDIAC CATHETERIZATION  1996; 1999  . CARDIAC CATHETERIZATION N/A 06/25/2016   Procedure: Left Heart Cath and Cors/Grafts Angiography;  Surgeon: Wellington Hampshire, MD;  Location: [place] [facility];  Service: Cardiovascular;  Laterality: N/A;  . CARDIOVERSION N/A 10/19/2019   Procedure: CARDIOVERSION;  Surgeon: Jerline Pain, MD;  Location: [facility] ENDOSCOPY;  Service: Cardiovascular;  Laterality: N/A;  . CAROTID ENDARTERECTOMY Left 01/02/2011  . CATARACT EXTRACTION W/ INTRAOCULAR LENS  IMPLANT, BILATERAL    . CLOSED REDUCTION NASAL FRACTURE  11/2007  . CORONARY ANGIOPLASTY WITH STENT PLACEMENT  10/10/2011   drug eluting  to rc & saphenous  . CORONARY ARTERY BYPASS GRAFT  1999    "CABG X3"  . DILATION AND CURETTAGE OF UTERUS    . FEMORAL ARTERY STENT Bilateral   . FRACTURE SURGERY    . LEFT HEART CATHETERIZATION WITH CORONARY/GRAFT ANGIOGRAM N/A 04/22/2011   Procedure: LEFT HEART CATHETERIZATION WITH Beatrix Fetters;  Surgeon: Jolaine Artist, MD;  Location: [facility] CATH LAB;  Service: Cardiovascular;  Laterality: N/A;  . LEFT HEART CATHETERIZATION WITH CORONARY/GRAFT ANGIOGRAM N/A 10/10/2011   Procedure: LEFT HEART CATHETERIZATION WITH Beatrix Fetters;  Surgeon: Peter M Martinique, MD;  Location: [facility] CATH LAB;  Service: Cardiovascular;  Laterality: N/A;  . NASAL SINUS SURGERY     twice  .  SQUAMOUS CELL CARCINOMA EXCISION    . SVT ABLATION N/A 03/19/2018   Procedure: SVT ABLATION;  Surgeon: Constance Haw, MD;  Location: [place] [facility];  Service: Cardiovascular;  Laterality: N/A;  . TUBAL LIGATION      Current Outpatient Medications  Medication Sig Dispense Refill  . acetaminophen (TYLENOL) 650 MG CR tablet Take 1,300 mg by mouth 2 (two) times daily.    Marland Kitchen albuterol (PROVENTIL) (2.5 MG/3ML) 0.083% nebulizer solution Take 3 mLs (2.5 mg total) by nebulization every 4 (four) hours as needed for wheezing or shortness of breath. Dx:J44.9 75 mL 12  . albuterol (VENTOLIN HFA) 108 (90 Base) MCG/ACT inhaler INHALE 2 PUFFS INTO THE LUNGS EVERY 6 (SIX) HOURS AS NEEDED FOR WHEEZING. (Patient taking differently: Inhale 2 puffs into the lungs every 6 (six) hours as needed for wheezing or shortness of breath. ) 18 g 5  . amLODipine (NORVASC) 5 MG tablet TAKE 1 TABLET BY MOUTH ONCE DAILY (Patient taking differently: Take 5 mg by mouth daily. ) 90 tablet 3  . azelastine (ASTELIN) 0.1 % nasal spray Place 2 sprays into both nostrils at bedtime as needed for rhinitis. 30 mL 3  . Biotin 5 MG TABS Take 5 mg by mouth daily.     . Calcium Carbonate-Vitamin D (CALCIUM + D PO) Take 1 tablet by mouth daily.     . Cholecalciferol (VITAMIN D3) 2000 units TABS Take 2,000  Units by mouth daily.     . clopidogrel (PLAVIX) 75 MG tablet TAKE 1 TABLET BY MOUTH ONCE A DAY 90 tablet 0  . diltiazem (CARDIZEM CD) 240 MG 24 hr capsule Take 1 capsule (240 mg total) by mouth daily. Pt must keep appt in May to continue receiving refills 90 capsule 0  . diltiazem (CARDIZEM) 30 MG tablet Take one tablet by mouth every 4 hours AS NEEDED for A-fib HR > 100 as long as BP > 100. (Patient taking differently: Take 30 mg by mouth every 4 (four) hours as needed (aFib [HR >100 and if BP >100]). ) 45 tablet 0  . ezetimibe (ZETIA) 10 MG tablet TAKE 1 TABLET BY MOUTH DAILY 90 tablet 0  . furosemide (LASIX) 20 MG tablet Take 1 tablet (20 mg total) by mouth as directed. Take 1 tablet (20 mg) daily and extra 1 tablet (20 mg) at 2 PM for shortness of breath or abdominal swelling (Patient taking differently: Take 20 mg by mouth as directed. Take 1 tablet (20 mg)by mouth scheduled in the morning & may take an additional 1 tablet (20 mg) by mouth at 2 PM for shortness of breath or abdominal swelling) 180 tablet 3  . HYDROcodone-acetaminophen (NORCO) 10-325 MG tablet Take 1 tablet by mouth every 6 (six) hours as needed for severe pain (pain.).     Marland Kitchen isosorbide mononitrate (IMDUR) 60 MG 24 hr tablet TAKE 1 TABLET BY MOUTH TWICE A DAY (Patient taking differently: Take 60 mg by mouth 2 (two) times daily. ) 180 tablet 3  . Krill Oil 500 MG CAPS Take 500 mg by mouth daily.     Marland Kitchen levothyroxine (SYNTHROID) 50 MCG tablet TAKE 1 TABLET BY MOUTH ONCE DAILY (Patient taking differently: Take 50 mcg by mouth daily at 6 (six) AM. ) 90 tablet 3  . nitroGLYCERIN (NITROSTAT) 0.4 MG SL tablet Place 1 tablet (0.4 mg total) under the tongue every 5 (five) minutes as needed for chest pain. 25 tablet 0  . omeprazole (PRILOSEC) 20 MG capsule TAKE 1  CAPSULE BY MOUTH ONCE DAILY (Patient taking differently: Take 20 mg by mouth daily. ) 30 capsule 5  . Polyethyl Glycol-Propyl Glycol (SYSTANE OP) Place 1 drop into both eyes 2 (two)  times daily as needed (dry/irritated eyes.).     Marland Kitchen rivaroxaban (XARELTO) 20 MG TABS tablet Take 1 tablet (20 mg total) by mouth daily with supper. (Patient taking differently: Take 20 mg by mouth daily at 6 PM. ) 30 tablet 6  . rosuvastatin (CRESTOR) 40 MG tablet TAKE 1 TABLET BY MOUTH ONCE DAILY (Patient taking differently: Take 40 mg by mouth every evening. ) 90 tablet 0  . sertraline (ZOLOFT) 100 MG tablet Take 1 tablet (100 mg total) by mouth daily. (Patient taking differently: Take 100 mg by mouth every evening. ) 90 tablet 3  . STIOLTO RESPIMAT 2.5-2.5 MCG/ACT AERS INHALE 2 PUFFS INTO THE LUNGS ONCE DAILY (Patient taking differently: Inhale 2 puffs into the lungs daily. ) 4 g 10  . Vitamin D, Ergocalciferol, (DRISDOL) 1.25 MG (50000 UNIT) CAPS capsule Take 1 capsule (50,000 Units total) by mouth once a week. (Patient taking differently: Take 50,000 Units by mouth every Monday. ) 12 capsule 1  . zinc gluconate 50 MG tablet Take 50 mg by mouth daily.     No current facility-administered medications for this encounter.    Allergies  Allergen Reactions  . Erythromycin Other (See Comments)    Tongue burns  . Meperidine Nausea And Vomiting  . Shellfish Allergy Nausea And Vomiting  . Ciprofloxacin Rash and Other (See Comments)    Rash from IV  . Codeine Nausea And Vomiting  . Penicillins Rash and Other (See Comments)    Has patient had a PCN reaction causing immediate rash, facial/tongue/throat swelling, SOB or lightheadedness with hypotension:unsure Has patient had a PCN reaction causing severe rash involving mucus membranes or skin necrosis:No Has patient had a PCN reaction that required hospitalization:No Has patient had a PCN reaction occurring within the last 10 years:No If all of the above answers are "NO", then may proceed with Cephalosporin use.   . Tape Rash    pls use paper tape    Social History   Socioeconomic History  . Marital status: Married    Spouse name: Not on  file  . Number of children: 5  . Years of education: Not on file  . Highest education level: Not on file  Occupational History  . 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 Smoking status: Former Smoker    Packs/day: 0.50    Years: 40.00    Pack years: 20.00    Types: Cigarettes    Quit date: 01/19/1993    Years since quitting: 26.7  . Smokeless tobacco: Never Used  Substance and Sexual Activity  . Alcohol use: Yes    Comment: 01/02/2016 "might drink a glass of wine socially q 3-4 months"  . Drug use: No  . Sexual activity: Not Currently    Birth control/protection: Post-menopausal  Other Topics Concern  . Not on file  Social History Narrative   Married with 5 children   Social Determinants of Health   Financial Resource Strain:   . Difficulty of Paying Living Expenses:   Food Insecurity:   . Worried About Charity fundraiser in the Last Year:   . Arboriculturist in the Last Year:   Transportation Needs:   . Film/video editor (Medical):   Marland Kitchen  Lack of Transportation (Non-Medical):   Physical Activity:   . Days of Exercise per Week:   . Minutes of Exercise per Session:   Stress:   . Feeling of Stress :   Social Connections:   . Frequency of Communication with Friends and Family:   . Frequency of Social Gatherings with Friends and Family:   . Attends Religious Services:   . Active Member of Clubs or Organizations:   . Attends Archivist Meetings:   Marland Kitchen Marital Status:   Intimate Partner Violence:   . Fear of Current or Ex-Partner:   . Emotionally Abused:   Marland Kitchen Physically Abused:   . Sexually Abused:      ROS- All systems are reviewed and negative except as per the HPI above.  Physical Exam: Vitals:   10/27/19 1134  BP: 130/74  Pulse: 79  Weight: 66.1 kg  Height: 5\' 2"  (1.575 m)    GEN- The patient is well appearing elderly female, alert and oriented x 3 today.   HEENT-head normocephalic, atraumatic,  sclera clear, conjunctiva pink, hearing intact, trachea midline. Lungs- Clear to ausculation bilaterally, normal work of breathing Heart- Regular rate and rhythm, occasional ectopic beat, no murmurs, rubs or gallops  GI- soft, NT, ND, + BS Extremities- no clubbing, cyanosis, or edema MS- no significant deformity or atrophy Skin- no rash or lesion Psych- euthymic mood, full affect Neuro- strength and sensation are intact   Wt Readings from Last 3 Encounters:  10/27/19 66.1 kg  10/11/19 67.1 kg  10/07/19 66.2 kg    EKG today demonstrates SR HR 79, PACs, PR 154, QRS 90, QTc 438  Echo 08/16/19 demonstrated  1. Left ventricular ejection fraction, by estimation, is 60 to 65%. The  left ventricle has normal function. The left ventricle has no regional  wall motion abnormalities. Left ventricular diastolic parameters are  indeterminate.  2. Right ventricular systolic function is moderately reduced. The right  ventricular size is moderately enlarged. moderately increased right  ventricular wall thickness. There is moderately elevated pulmonary artery  systolic pressure. The estimated right  ventricular systolic pressure is 24.0 mmHg.  3. Left atrial size was moderately dilated.  4. Right atrial size was moderately dilated.  5. The mitral valve is degenerative. Mild to moderate mitral valve  regurgitation.  6. Tricuspid valve regurgitation is severe.  7. The aortic valve is tricuspid. Aortic valve regurgitation is not  visualized. Mild to moderate aortic valve sclerosis/calcification is  present, without any evidence of aortic stenosis.  8. The inferior vena cava is dilated in size with <50% respiratory  variability, suggesting right atrial pressure of 15 mmHg.   Epic records are reviewed at length today  Assessment and Plan:  1. Persistent atrial fibrillation Patient's AAD options are limited: did not tolerate Multaq 2/2 GI side effects, class IC contraindicated with CAD,  would also avoid amiodarone given lung disease.  S/p DCCV on 10/19/19. We had another discussion today about rhythm vs rate control. She does not feel much different in SR. She reports she cannot be away from her husband at this time with his current health issues. Could consider dofetilide but she may be a good candidate for rate control given her paucity of symptoms.  Continue diltiazem 180 mg daily and 30 mg PRN q 4hrs for heart racing. Continue Xarelto 20 mg daily.  This patients CHA2DS2-VASc Score and unadjusted Ischemic Stroke Rate (% per year) is equal to 7.2 % stroke rate/year from a score of 5  Above score calculated as 1 point each if present [CHF, HTN, DM, Vascular=MI/PAD/Aortic Plaque, Age if 65-74, or Female] Above score calculated as 2 points each if present [Age > 75, or Stroke/TIA/TE]  2. HTN Stable, no changes today.  3. Obstructive sleep apnea Encouraged compliance with CPAP therapy.  4. CAD s/p CABG No anginal symptoms.   Follow up with Dr Curt Bears in 3 months.    [facility] [address] [place], [place] [address] [phone] 10/27/2019 12:01 PM

## 2019-11-15 ENCOUNTER — Other Ambulatory Visit: Payer: Self-pay | Admitting: *Deleted

## 2019-11-15 MED ORDER — STIOLTO RESPIMAT 2.5-2.5 MCG/ACT IN AERS: 2.0000 | INHALATION_SPRAY | Freq: Every day | RESPIRATORY_TRACT | 0 refills | Status: AC

## 2019-11-16 ENCOUNTER — Ambulatory Visit: Payer: Medicare HMO | Admitting: Emergency Medicine

## 2019-11-16 ENCOUNTER — Other Ambulatory Visit: Payer: Self-pay | Admitting: Cardiovascular Disease

## 2019-11-22 DIAGNOSIS — J449 Chronic obstructive pulmonary disease, unspecified: Secondary | ICD-10-CM | POA: Diagnosis not present

## 2019-12-19 ENCOUNTER — Other Ambulatory Visit: Payer: Self-pay

## 2019-12-19 MED ORDER — DILTIAZEM HCL ER COATED BEADS 240 MG PO CP24: 240.0000 mg | ORAL_CAPSULE | Freq: Every day | ORAL | 2 refills | Status: DC

## 2019-12-22 DIAGNOSIS — J449 Chronic obstructive pulmonary disease, unspecified: Secondary | ICD-10-CM | POA: Diagnosis not present

## 2020-01-02 ENCOUNTER — Other Ambulatory Visit: Payer: Self-pay

## 2020-01-02 ENCOUNTER — Encounter: Payer: Self-pay | Admitting: Emergency Medicine

## 2020-01-02 ENCOUNTER — Ambulatory Visit: Payer: Medicare HMO | Admitting: Emergency Medicine

## 2020-01-02 DIAGNOSIS — J9611 Chronic respiratory failure with hypoxia: Secondary | ICD-10-CM

## 2020-01-02 DIAGNOSIS — I5033 Acute on chronic diastolic (congestive) heart failure: Secondary | ICD-10-CM

## 2020-01-02 DIAGNOSIS — G4733 Obstructive sleep apnea (adult) (pediatric): Secondary | ICD-10-CM | POA: Diagnosis not present

## 2020-01-02 DIAGNOSIS — J449 Chronic obstructive pulmonary disease, unspecified: Secondary | ICD-10-CM

## 2020-01-02 MED ORDER — TRELEGY ELLIPTA 100-62.5-25 MCG/INH IN AEPB
1.0000 | INHALATION_SPRAY | Freq: Every day | RESPIRATORY_TRACT | 5 refills | Status: DC
Start: 2020-01-02 — End: 2020-08-10

## 2020-01-02 NOTE — Patient Instructions (Addendum)
Please stop Stiolto for now. We will start Trelegy, 1 inhalation once daily.  Rinse and gargle after using. Keep your albuterol available to use 1 nebulizer treatment or 2 puffs up to every 4 hours if needed for shortness of breath, chest tightness, wheezing. Wear your oxygen with exertion.  Our goal is to keep your saturation greater than 90% Continue your CPAP every night as you have been using it. Continue to follow with cardiology Follow with Dr Lamonte Sakai in 3 months or sooner if you have any problems.

## 2020-01-02 NOTE — Assessment & Plan Note (Signed)
Good compliance with CPAP and good benefit from CPAP.  Less daytime sleepiness, less napping.  More energy.  We will continue same

## 2020-01-02 NOTE — Progress Notes (Signed)
Subjective:    [name], female    DOB: [birth year], 79 y.o.   MRN: [id number]  HPI 79 year old former smoker (30 pack years) with a history of COPD, OSA on CPAP.  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  She had a pulmonary embolism after her CABG.  She is changing physicians from Dr. Mortimer Fries from [place].  She is on Stiolto. Uses albuterol nebs 1-2x a day with some relief. 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  She has oxygen available use which she uses with most exertion. Wears her CPAP reliably, still feels good benefit from it. She flares about once a year. She was once on an extended course of prednisone after her CABG and PE's, ? For pleuritic pain. Minimal cough. She does wheeze, most days, often in the am - responds to SABA.   PFT from 07/25/15 reviewed show   Review of Systems As per HPI  Past Medical History:  Diagnosis Date  . AAA (abdominal aortic aneurysm) ([place]) 01/2009   AAA (2.8 x 3.0)  moderate RAS (left); 2.7 x 2.7 cm (07/11/05)  . Acute bronchitis 03/20/2013; 2017  . Angina   . Anosmia   . Asthma   . Baker's cyst of knee 01/30/2014  . Basal cell carcinoma    "back and left arm"  . Bradycardia    Metoprolol stopped 08/2011  . CAD (coronary artery disease)    s/CABG (reports IMA and 2 SVGs) back in 1999  Myoview normal 3/10; s/p PCI with DES to PL branch of distal RCA 09/2011; PCI +DES to SVG-RCA, PCI + DES to mid LCx 12/2015  . Cerebrovascular disease 01/2009   carotid u/s  R 0-39%   L 60-79%  . Chronic thoracic back pain   . COPD (chronic obstructive pulmonary disease) ([place])   . Depression   . Dizziness   . Dysrhythmia    hx of sinus brady  . GERD (gastroesophageal reflux disease)   . Heart murmur   . Herniated lumbar disc without myelopathy   . History of blood transfusion 1999   "when I had the  bypass; had a PE"  . Hyperglycemia 10/23/2015  . Hyperlipidemia   . Hypertension   . Hypothyroidism 09/30/2016  . Medicare annual wellness visit, subsequent 07/31/2013  . NSTEMI (non-ST elevated myocardial infarction) ([place]) 11/12   Cath showed atretic IMA graft to the LAD, SVG to PD was patent but the continuation of this graft to the PL branch was occluded; there were L to R collaterals; Mid LAD had a 60 to 70% stenosis. She has been treated medically.  Neg Myoview 05/2011  . Osteoarthritis of back   . Osteoporosis   . Pneumonia "several times"  . Pulmonary embolism ([place]) 1999   "after my bypass"  . PVD (peripheral vascular disease) ([place])   . Shortness of breath   . Squamous carcinoma    "nose"  . Thyroid disease    Hypothyroid  . Unstable angina ([place]) 09/25/2015     Family History  Problem Relation Age of Onset  . Heart attack Mother 59  . Diabetes Mother   . Colon cancer Father 66  . Lung cancer Father        smoked  . Heart disease Father   . Hypertension Father   . Angina Father   . Lung cancer Sister  smoked  . Hypothyroidism Sister   . Hypertension Brother   . Heart attack Brother   . Heart disease Brother        PTCA with Stent  . Heart attack Brother        CABG  . Heart disease Brother        CABG with 1 bypass  . Aneurysm Brother        brain  . Alcohol abuse Brother   . Hyperlipidemia Daughter   . Diabetes Maternal Grandmother   . Stroke Maternal Grandmother   . Cirrhosis Maternal Grandfather   . Stroke Paternal Grandmother   . Obesity Daughter   . Obesity Son      Social History   Socioeconomic History  . Marital status: Married    Spouse name: Not on file  . Number of children: 5  . Years of education: Not on file  . Highest education level: Not on file  Occupational History  . 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 Smoking status: Former Smoker    Packs/day: 0.50    Years: 40.00    Pack  years: 20.00    Types: Cigarettes    Quit date: 01/19/1993    Years since quitting: 26.9  . Smokeless tobacco: Never Used  Vaping Use  . Vaping Use: Never used  Substance and Sexual Activity  . Alcohol use: Yes    Comment: 01/02/2016 "might drink a glass of wine socially q 3-4 months"  . Drug use: No  . Sexual activity: Not Currently    Birth control/protection: Post-menopausal  Other Topics Concern  . Not on file  Social History Narrative   Married with 5 children   Social Determinants of Health   Financial Resource Strain:   . Difficulty of Paying Living Expenses:   Food Insecurity:   . Worried About Charity fundraiser in the Last Year:   . Arboriculturist in the Last Year:   Transportation Needs:   . Film/video editor (Medical):   Marland Kitchen Lack of Transportation (Non-Medical):   Physical Activity:   . Days of Exercise per Week:   . Minutes of Exercise per Session:   Stress:   . Feeling of Stress :   Social Connections:   . Frequency of Communication with Friends and Family:   . Frequency of Social Gatherings with Friends and Family:   . Attends Religious Services:   . Active Member of Clubs or Organizations:   . Attends Archivist Meetings:   Marland Kitchen Marital Status:   Intimate Partner Violence:   . Fear of Current or Ex-Partner:   . Emotionally Abused:   Marland Kitchen Physically Abused:   . Sexually Abused:      Allergies  Allergen Reactions  . Erythromycin Other (See Comments)    Tongue burns  . Meperidine Nausea And Vomiting  . Shellfish Allergy Nausea And Vomiting  . Ciprofloxacin Rash and Other (See Comments)    Rash from IV  . Codeine Nausea And Vomiting  . Penicillins Rash and Other (See Comments)    Has patient had a PCN reaction causing immediate rash, facial/tongue/throat swelling, SOB or lightheadedness with hypotension:unsure Has patient had a PCN reaction causing severe rash involving mucus membranes or skin necrosis:No Has patient had a PCN reaction that  required hospitalization:No Has patient had a PCN reaction occurring within the last 10 years:No If all of the above  answers are "NO", then may proceed with Cephalosporin use.   . Tape Rash    pls use paper tape     Outpatient Medications Prior to Visit  Medication Sig Dispense Refill  . acetaminophen (TYLENOL) 650 MG CR tablet Take 1,300 mg by mouth 2 (two) times daily.    Marland Kitchen albuterol (PROVENTIL) (2.5 MG/3ML) 0.083% nebulizer solution Take 3 mLs (2.5 mg total) by nebulization every 4 (four) hours as needed for wheezing or shortness of breath. Dx:J44.9 75 mL 12  . albuterol (VENTOLIN HFA) 108 (90 Base) MCG/ACT inhaler INHALE 2 PUFFS INTO THE LUNGS EVERY 6 (SIX) HOURS AS NEEDED FOR WHEEZING. (Patient taking differently: Inhale 2 puffs into the lungs every 6 (six) hours as needed for wheezing or shortness of breath. ) 18 g 5  . amLODipine (NORVASC) 5 MG tablet TAKE 1 TABLET BY MOUTH ONCE DAILY (Patient taking differently: Take 5 mg by mouth daily. ) 90 tablet 3  . azelastine (ASTELIN) 0.1 % nasal spray Place 2 sprays into both nostrils at bedtime as needed for rhinitis. 30 mL 3  . Biotin 5 MG TABS Take 5 mg by mouth daily.     . Calcium Carbonate-Vitamin D (CALCIUM + D PO) Take 1 tablet by mouth daily.     . Cholecalciferol (VITAMIN D3) 2000 units TABS Take 2,000 Units by mouth daily.     . clopidogrel (PLAVIX) 75 MG tablet TAKE 1 TABLET BY MOUTH ONCE A DAY 90 tablet 0  . diltiazem (CARDIZEM CD) 240 MG 24 hr capsule Take 1 capsule (240 mg total) by mouth daily. 90 capsule 2  . diltiazem (CARDIZEM) 30 MG tablet Take one tablet by mouth every 4 hours AS NEEDED for A-fib HR > 100 as long as BP > 100. (Patient taking differently: Take 30 mg by mouth every 4 (four) hours as needed (aFib [HR >100 and if BP >100]). ) 45 tablet 0  . ezetimibe (ZETIA) 10 MG tablet TAKE 1 TABLET BY MOUTH DAILY 90 tablet 0  . furosemide (LASIX) 20 MG tablet Take 1 tablet (20 mg total) by mouth as directed. Take 1 tablet (20  mg) daily and extra 1 tablet (20 mg) at 2 PM for shortness of breath or abdominal swelling (Patient taking differently: Take 20 mg by mouth as directed. Take 1 tablet (20 mg)by mouth scheduled in the morning & may take an additional 1 tablet (20 mg) by mouth at 2 PM for shortness of breath or abdominal swelling) 180 tablet 3  . HYDROcodone-acetaminophen (NORCO) 10-325 MG tablet Take 1 tablet by mouth every 6 (six) hours as needed for severe pain (pain.).     Marland Kitchen isosorbide mononitrate (IMDUR) 60 MG 24 hr tablet TAKE 1 TABLET BY MOUTH TWICE A DAY (Patient taking differently: Take 60 mg by mouth 2 (two) times daily. ) 180 tablet 3  . Krill Oil 500 MG CAPS Take 500 mg by mouth daily.     Marland Kitchen levothyroxine (SYNTHROID) 50 MCG tablet TAKE 1 TABLET BY MOUTH ONCE DAILY (Patient taking differently: Take 50 mcg by mouth daily at 6 (six) AM. ) 90 tablet 3  . nitroGLYCERIN (NITROSTAT) 0.4 MG SL tablet Place 1 tablet (0.4 mg total) under the tongue every 5 (five) minutes as needed for chest pain. 25 tablet 0  . omeprazole (PRILOSEC) 20 MG capsule TAKE 1 CAPSULE BY MOUTH ONCE DAILY (Patient taking differently: Take 20 mg by mouth daily. ) 30 capsule 5  . Polyethyl Glycol-Propyl Glycol (SYSTANE OP) Place 1  drop into both eyes 2 (two) times daily as needed (dry/irritated eyes.).     Marland Kitchen rivaroxaban (XARELTO) 20 MG TABS tablet Take 1 tablet (20 mg total) by mouth daily with supper. (Patient taking differently: Take 20 mg by mouth daily at 6 PM. ) 30 tablet 6  . rosuvastatin (CRESTOR) 40 MG tablet TAKE 1 TABLET BY MOUTH ONCE DAILY 90 tablet 2  . sertraline (ZOLOFT) 100 MG tablet Take 1 tablet (100 mg total) by mouth daily. (Patient taking differently: Take 100 mg by mouth every evening. ) 90 tablet 3  . STIOLTO RESPIMAT 2.5-2.5 MCG/ACT AERS INHALE 2 PUFFS INTO THE LUNGS ONCE DAILY (Patient taking differently: Inhale 2 puffs into the lungs daily. ) 4 g 10  . Vitamin D, Ergocalciferol, (DRISDOL) 1.25 MG (50000 UNIT) CAPS capsule  Take 1 capsule (50,000 Units total) by mouth once a week. (Patient taking differently: Take 50,000 Units by mouth every Monday. ) 12 capsule 1  . zinc gluconate 50 MG tablet Take 50 mg by mouth daily.     No facility-administered medications prior to visit.         Objective:   Physical Exam  Vitals:   01/02/20 1332  BP: 128/72  Pulse: 66  Temp: 97.8 F (36.6 C)  TempSrc: Temporal  SpO2: 94%  Weight: 144 lb 3.2 oz (65.4 kg)  Height: 5\' 2"  (1.575 m)   Gen: Pleasant, overweight elderly woman, in no distress,  normal affect  ENT: No lesions,  mouth clear,  oropharynx clear, no postnasal drip  Neck: No JVD, no stridor  Lungs: No use of accessory muscles, no crackles.  No wheeze on a normal expiration.  She does have end expiratory wheeze on a forced expiration  Cardiovascular: Irregularly irregular, trace pretibial edema left greater than right  Musculoskeletal: No deformities, no cyanosis or clubbing  Neuro: alert, awake, non focal  Skin: Warm, no lesions or rash      Assessment & Plan:  COPD GOLD C Significant COPD with moderately severe obstruction based on pulmonary function testing from 2017. She has exertional hypoxemia.  Overall her dyspnea is likely multifactorial.  She does flare at least once a year and may benefit from addition of ICS.  I will try changing her Stiolto to Trelegy to see if she gets benefit.  Continue albuterol as needed  Acute on chronic diastolic CHF (congestive heart failure) (HCC) Acute on chronic diastolic CHF.  She does not have much edema today.  Seems to be well compensated.  Uses her Lasix depending on weight gain.  She has had overall decline and more difficulty breathing since she has been back in permanent A. fib.  OSA (obstructive sleep apnea) Good compliance with CPAP and good benefit from CPAP.  Less daytime sleepiness, less napping.  More energy.  We will continue same  Chronic hypoxemic respiratory failure (HCC) Uses 2 L/min  with exertion.  Discussed reliable use with her, using her SPO2 to guide use as opposed to just dyspnea.  Baltazar Apo, MD, PhD 01/02/2020, 2:00 PM [facility] [phone] or if no answer [phone]

## 2020-01-02 NOTE — Assessment & Plan Note (Signed)
Significant COPD with moderately severe obstruction based on pulmonary function testing from 2017. She has exertional hypoxemia.  Overall her dyspnea is likely multifactorial.  She does flare at least once a year and may benefit from addition of ICS.  I will try changing her Stiolto to Trelegy to see if she gets benefit.  Continue albuterol as needed

## 2020-01-02 NOTE — Assessment & Plan Note (Signed)
Uses 2 L/min with exertion.  Discussed reliable use with her, using her SPO2 to guide use as opposed to just dyspnea.

## 2020-01-02 NOTE — Addendum Note (Signed)
Addended by: Gavin Potters R on: 01/02/2020 02:09 PM   Modules accepted: Orders

## 2020-01-02 NOTE — Assessment & Plan Note (Signed)
Acute on chronic diastolic CHF.  She does not have much edema today.  Seems to be well compensated.  Uses her Lasix depending on weight gain.  She has had overall decline and more difficulty breathing since she has been back in permanent A. fib.

## 2020-01-12 ENCOUNTER — Telehealth: Payer: Self-pay | Admitting: Family Medicine

## 2020-01-12 NOTE — Progress Notes (Signed)
  Chronic Care Management   Outreach Note  01/12/2020 Name: Andrea [name] MRN: [id number] DOB: [birth year]  Referred by: Mosie Lukes, MD Reason for referral : No chief complaint on file.   An unsuccessful telephone outreach was attempted today. The patient was referred to the pharmacist for assistance with care management and care coordination.   Follow Up Plan:   Carley Perdue [employer] Scheduler

## 2020-01-18 ENCOUNTER — Other Ambulatory Visit: Payer: Self-pay | Admitting: Family Medicine

## 2020-01-18 ENCOUNTER — Telehealth: Payer: Self-pay | Admitting: Family Medicine

## 2020-01-18 NOTE — Progress Notes (Signed)
  Chronic Care Management   Outreach Note  01/18/2020 Name: Shatori Bertucci MRN: [id number] DOB: [birth year]  Referred by: Mosie Lukes, MD Reason for referral : No chief complaint on file.   A second unsuccessful telephone outreach was attempted today. The patient was referred to pharmacist for assistance with care management and care coordination.  Follow Up Plan:   Carley Perdue [employer] Scheduler

## 2020-01-22 DIAGNOSIS — J449 Chronic obstructive pulmonary disease, unspecified: Secondary | ICD-10-CM | POA: Diagnosis not present

## 2020-01-27 ENCOUNTER — Other Ambulatory Visit: Payer: Self-pay | Admitting: Cardiovascular Disease

## 2020-01-27 ENCOUNTER — Other Ambulatory Visit: Payer: Self-pay

## 2020-01-27 DIAGNOSIS — G4733 Obstructive sleep apnea (adult) (pediatric): Secondary | ICD-10-CM | POA: Diagnosis not present

## 2020-01-27 MED ORDER — FUROSEMIDE 20 MG PO TABS: 20.0000 mg | ORAL_TABLET | ORAL | 2 refills | Status: DC

## 2020-02-06 DIAGNOSIS — Z0100 Encounter for examination of eyes and vision without abnormal findings: Secondary | ICD-10-CM | POA: Diagnosis not present

## 2020-02-06 DIAGNOSIS — H524 Presbyopia: Secondary | ICD-10-CM | POA: Diagnosis not present

## 2020-02-09 ENCOUNTER — Telehealth: Payer: Self-pay | Admitting: Family Medicine

## 2020-02-09 NOTE — Progress Notes (Signed)
  Chronic Care Management   Outreach Note  02/09/2020 Name: Andrea [name] MRN: [id number] DOB: May 10, 1941  Referred by: Mosie Lukes, MD Reason for referral : No chief complaint on file.   Third unsuccessful telephone outreach was attempted today. The patient was referred to the pharmacist for assistance with care management and care coordination.   Follow Up Plan:   Carley Perdue [employer] Scheduler

## 2020-02-15 ENCOUNTER — Telehealth: Payer: Self-pay | Admitting: Family Medicine

## 2020-02-15 NOTE — Progress Notes (Signed)
  Chronic Care Management   Outreach Note  02/15/2020 Name: Andrea [name] MRN: [id number] DOB: 1940-08-05  Referred by: Mosie Lukes, MD Reason for referral : No chief complaint on file.   Third unsuccessful telephone outreach was attempted today. The patient was referred to the pharmacist for assistance with care management and care coordination.   Follow Up Plan:   Carley Perdue [employer] Scheduler

## 2020-02-16 ENCOUNTER — Other Ambulatory Visit: Payer: Self-pay

## 2020-02-16 ENCOUNTER — Ambulatory Visit: Payer: Medicare HMO | Admitting: Cardiology

## 2020-02-16 ENCOUNTER — Telehealth: Payer: Self-pay | Admitting: Cardiology

## 2020-02-16 ENCOUNTER — Encounter: Payer: Self-pay | Admitting: Cardiology

## 2020-02-16 VITALS — BP 130/76 | HR 74 | Ht 62.0 in | Wt 141.6 lb

## 2020-02-16 DIAGNOSIS — I4819 Other persistent atrial fibrillation: Secondary | ICD-10-CM

## 2020-02-16 NOTE — Progress Notes (Signed)
Electrophysiology Office Note   Date:  02/16/2020   ID:  Andrea [name], DOB 05/04/1941, MRN [id number]  PCP:  Mosie Lukes, MD  Cardiologist:  Rockey Situ Primary Electrophysiologist:  Andrea Beaulieu Meredith Leeds, MD    No chief complaint on file.    History of Present Illness: Andrea [name] is a 79 y.o. female who is being seen today for the evaluation of SVT at the request of Christell Faith. Presenting today for electrophysiology evaluation.  She has a history of coronary disease status post CABG in 1999 status post PCI to the distal RCA in 2013, PCI x2 to the SVG to the RCA into the mid circumflex in 2017, PVD status post lower extremity stenting, carotid artery disease status post left-sided CEA, pulmonary hypertension, valvular heart disease, COPD, infrarenal AAA, PE, hypertension, hyperlipidemia, asthma, hypothyroidism.  Her a Holter monitor with episodes of SVT/atrial tachycardia, longest lasting 14 beats with frequent PACs and rare PVCs.  She has palpitations on a daily basis.  They last between 5 and 10 minutes at times.  There are no exacerbating or alleviating factors.  Had an EP study on 03/19/2018 which did not induce tachycardia.  She presented to cardiology clinic feeling quite short of breath.  She is now status post cardioversion 10/19/2019.  She went to the atrial fibrillation clinic a few weeks after her cardioversion and unfortunately did not feel much better in sinus rhythm.  Today, denies symptoms of palpitations, chest pain, orthopnea, PND, lower extremity edema, claudication, dizziness, presyncope, syncope, bleeding, or neurologic sequela. The patient is tolerating medications without difficulties.  Over the past few days she has been more short of breath and more bloated.  She has been having to use her inhalers and her Lasix more often.  She has not noted lower extremity edema, but does feel that her abdomen has gotten bigger.  She lost 3 pounds of fluid weight yesterday.  She does have  quite a few comorbidities, but is concerned that her atrial fibrillation is contributing to her symptoms.  She also complains of significant weakness and fatigue.   Past Medical History:  Diagnosis Date  . AAA (abdominal aortic aneurysm) ([place]) 01/2009   AAA (2.8 x 3.0)  moderate RAS (left); 2.7 x 2.7 cm (07/11/05)  . Acute bronchitis 03/20/2013; 2017  . Angina   . Anosmia   . Asthma   . Baker's cyst of knee 01/30/2014  . Basal cell carcinoma    "back and left arm"  . Bradycardia    Metoprolol stopped 08/2011  . CAD (coronary artery disease)    s/CABG (reports IMA and 2 SVGs) back in 1999  Myoview normal 3/10; s/p PCI with DES to PL branch of distal RCA 09/2011; PCI +DES to SVG-RCA, PCI + DES to mid LCx 12/2015  . Cerebrovascular disease 01/2009   carotid u/s  R 0-39%   L 60-79%  . Chronic thoracic back pain   . COPD (chronic obstructive pulmonary disease) ([place])   . Depression   . Dizziness   . Dysrhythmia    hx of sinus brady  . GERD (gastroesophageal reflux disease)   . Heart murmur   . Herniated lumbar disc without myelopathy   . History of blood transfusion 1999   "when I had the bypass; had a PE"  . Hyperglycemia 10/23/2015  . Hyperlipidemia   . Hypertension   . Hypothyroidism 09/30/2016  . Medicare annual wellness visit, subsequent 07/31/2013  . NSTEMI (non-ST elevated myocardial infarction) ([place]) 11/12  Cath showed atretic IMA graft to the LAD, SVG to PD was patent but the continuation of this graft to the PL branch was occluded; there were L to R collaterals; Mid LAD had a 60 to 70% stenosis. She has been treated medically.  Neg Myoview 05/2011  . Osteoarthritis of back   . Osteoporosis   . Pneumonia "several times"  . Pulmonary embolism ([place]) 1999   "after my bypass"  . PVD (peripheral vascular disease) ([place])   . Shortness of breath   . Squamous carcinoma    "nose"  . Thyroid disease    Hypothyroid  . Unstable angina ([place]) 09/25/2015   Past Surgical History:  Procedure  Laterality Date  . ABDOMINAL AORTIC ANEURYSM REPAIR     pt denies this hx on 01/02/2016  . BASAL CELL CARCINOMA EXCISION    . CARDIAC CATHETERIZATION N/A 01/02/2016   Procedure: Left Heart Cath and Coronary Angiography;  Surgeon: Wellington Hampshire, MD;  Location: [facility];  Service: Cardiovascular;  Laterality: N/A;  . CARDIAC CATHETERIZATION  1996; 1999  . CARDIAC CATHETERIZATION N/A 06/25/2016   Procedure: Left Heart Cath and Cors/Grafts Angiography;  Surgeon: Wellington Hampshire, MD;  Location: [facility];  Service: Cardiovascular;  Laterality: N/A;  . CARDIOVERSION N/A 10/19/2019   Procedure: CARDIOVERSION;  Surgeon: Jerline Pain, MD;  Location: [facility];  Service: Cardiovascular;  Laterality: N/A;  . CAROTID ENDARTERECTOMY Left 01/02/2011  . CATARACT EXTRACTION W/ INTRAOCULAR LENS  IMPLANT, BILATERAL    . CLOSED REDUCTION NASAL FRACTURE  11/2007  . CORONARY ANGIOPLASTY WITH STENT PLACEMENT  10/10/2011   drug eluting  to rc & saphenous  . CORONARY ARTERY BYPASS GRAFT  1999   "CABG X3"  . DILATION AND CURETTAGE OF UTERUS    . FEMORAL ARTERY STENT Bilateral   . FRACTURE SURGERY    . LEFT HEART CATHETERIZATION WITH CORONARY/GRAFT ANGIOGRAM N/A 04/22/2011   Procedure: LEFT HEART CATHETERIZATION WITH Beatrix Fetters;  Surgeon: Jolaine Artist, MD;  Location: [facility] CATH LAB;  Service: Cardiovascular;  Laterality: N/A;  . LEFT HEART CATHETERIZATION WITH CORONARY/GRAFT ANGIOGRAM N/A 10/10/2011   Procedure: LEFT HEART CATHETERIZATION WITH Beatrix Fetters;  Surgeon: Peter M Martinique, MD;  Location: [facility] CATH LAB;  Service: Cardiovascular;  Laterality: N/A;  . NASAL SINUS SURGERY     twice  . SQUAMOUS CELL CARCINOMA EXCISION    . SVT ABLATION N/A 03/19/2018   Procedure: SVT ABLATION;  Surgeon: Constance Haw, MD;  Location: [facility];  Service: Cardiovascular;  Laterality: N/A;  . TUBAL LIGATION       Current Outpatient Medications  Medication Sig  Dispense Refill  . acetaminophen (TYLENOL) 650 MG CR tablet Take 1,300 mg by mouth 2 (two) times daily.    Marland Kitchen albuterol (PROVENTIL) (2.5 MG/3ML) 0.083% nebulizer solution Take 3 mLs (2.5 mg total) by nebulization every 4 (four) hours as needed for wheezing or shortness of breath. Dx:J44.9 75 mL 12  . albuterol (VENTOLIN HFA) 108 (90 Base) MCG/ACT inhaler INHALE 2 PUFFS INTO THE LUNGS EVERY 6 (SIX) HOURS AS NEEDED FOR WHEEZING. (Patient taking differently: Inhale 2 puffs into the lungs every 6 (six) hours as needed for wheezing or shortness of breath. ) 18 g 5  . amLODipine (NORVASC) 5 MG tablet TAKE 1 TABLET BY MOUTH ONCE DAILY (Patient taking differently: Take 5 mg by mouth daily. ) 90 tablet 3  . azelastine (ASTELIN) 0.1 % nasal spray Place 2 sprays into both nostrils  at bedtime as needed for rhinitis. 30 mL 3  . Biotin 5 MG TABS Take 5 mg by mouth daily.     . Calcium Carbonate-Vitamin D (CALCIUM + D PO) Take 1 tablet by mouth daily.     . Cholecalciferol (VITAMIN D3) 2000 units TABS Take 2,000 Units by mouth daily.     . clopidogrel (PLAVIX) 75 MG tablet TAKE 1 TABLET BY MOUTH ONCE A DAY 90 tablet 0  . diltiazem (CARDIZEM CD) 240 MG 24 hr capsule Take 1 capsule (240 mg total) by mouth daily. 90 capsule 2  . diltiazem (CARDIZEM) 30 MG tablet Take one tablet by mouth every 4 hours AS NEEDED for A-fib HR > 100 as long as BP > 100. (Patient taking differently: Take 30 mg by mouth every 4 (four) hours as needed (aFib [HR >100 and if BP >100]). ) 45 tablet 0  . ezetimibe (ZETIA) 10 MG tablet TAKE 1 TABLET BY MOUTH DAILY 90 tablet 0  . Fluticasone-Umeclidin-Vilant (TRELEGY ELLIPTA) 100-62.5-25 MCG/INH AEPB Inhale 1 puff into the lungs daily. 60 each 5  . furosemide (LASIX) 20 MG tablet Take 1 tablet (20 mg total) by mouth as directed. Take 1 tablet (20 mg) daily and extra 1 tablet (20 mg) at 2 PM for shortness of breath or abdominal swelling 180 tablet 2  . HYDROcodone-acetaminophen (NORCO) 10-325 MG  tablet Take 1 tablet by mouth every 6 (six) hours as needed for severe pain (pain.).     Marland Kitchen isosorbide mononitrate (IMDUR) 60 MG 24 hr tablet TAKE 1 TABLET BY MOUTH TWICE A DAY (Patient taking differently: Take 60 mg by mouth 2 (two) times daily. ) 180 tablet 3  . Krill Oil 500 MG CAPS Take 500 mg by mouth daily.     Marland Kitchen levothyroxine (SYNTHROID) 50 MCG tablet TAKE 1 TABLET BY MOUTH ONCE DAILY (Patient taking differently: Take 50 mcg by mouth daily at 6 (six) AM. ) 90 tablet 3  . nitroGLYCERIN (NITROSTAT) 0.4 MG SL tablet Place 1 tablet (0.4 mg total) under the tongue every 5 (five) minutes as needed for chest pain. 25 tablet 0  . omeprazole (PRILOSEC) 20 MG capsule TAKE 1 CAPSULE BY MOUTH ONCE DAILY (Patient taking differently: Take 20 mg by mouth daily. ) 30 capsule 5  . Polyethyl Glycol-Propyl Glycol (SYSTANE OP) Place 1 drop into both eyes 2 (two) times daily as needed (dry/irritated eyes.).     Marland Kitchen rivaroxaban (XARELTO) 20 MG TABS tablet Take 1 tablet (20 mg total) by mouth daily with supper. (Patient taking differently: Take 20 mg by mouth daily at 6 PM. ) 30 tablet 6  . rosuvastatin (CRESTOR) 40 MG tablet TAKE 1 TABLET BY MOUTH ONCE DAILY 90 tablet 2  . sertraline (ZOLOFT) 100 MG tablet Take 1 tablet (100 mg total) by mouth daily. (Patient taking differently: Take 100 mg by mouth every evening. ) 90 tablet 3  . Vitamin D, Ergocalciferol, (DRISDOL) 1.25 MG (50000 UNIT) CAPS capsule TAKE 1 CAPSULE (50,000 UNITS) BY MOUTH ONCE A WEEK. 12 capsule 1  . zinc gluconate 50 MG tablet Take 50 mg by mouth daily.     No current facility-administered medications for this visit.    Allergies:   Erythromycin, Meperidine, Shellfish allergy, Ciprofloxacin, Codeine, Penicillins, and Tape   Social History:  The patient  reports that she quit smoking about 27 years ago. Her smoking use included cigarettes. She has a 20.00 pack-year smoking history. She has never used smokeless tobacco. She reports current alcohol  use. She reports that she does not use drugs.   Family History:  The patient's family history includes Alcohol abuse in her brother; Aneurysm in her brother; Angina in her father; Cirrhosis in her maternal grandfather; Colon cancer (age of onset: 52) in her father; Diabetes in her maternal grandmother and mother; Heart attack in her brother and brother; Heart attack (age of onset: 61) in her mother; Heart disease in her brother, brother, and father; Hyperlipidemia in her daughter; Hypertension in her brother and father; Hypothyroidism in her sister; Lung cancer in her father and sister; Obesity in her daughter and son; Stroke in her maternal grandmother and paternal grandmother.   ROS:  Please see the history of present illness.   Otherwise, review of systems is positive for none.   All other systems are reviewed and negative.   PHYSICAL EXAM: VS:  BP 130/76   Pulse 74   Ht 5\' 2"  (1.575 m)   Wt 141 lb 9.6 oz (64.2 kg)   SpO2 94%   BMI 25.90 kg/m  , BMI Body mass index is 25.9 kg/m. GEN: Well nourished, well developed, in no acute distress  HEENT: normal  Neck: no JVD, carotid bruits, or masses Cardiac: irregular; no murmurs, rubs, or gallops,no edema  Respiratory:  clear to auscultation bilaterally, normal work of breathing GI: soft, nontender, nondistended, + BS MS: no deformity or atrophy  Skin: warm and dry Neuro:  Strength and sensation are intact Psych: euthymic mood, full affect  EKG:  EKG is ordered today. Personal review of the ekg ordered shows atrial fibrillation, rate 73  Recent Labs: 03/22/2019: NT-Pro BNP 567 06/13/2019: ALT 9; TSH 1.98 08/16/2019: B Natriuretic Peptide 486.0 10/07/2019: Platelets 215 10/19/2019: BUN 13; Creatinine, Ser 0.80; Hemoglobin 14.6; Potassium 3.6; Sodium 142    Lipid Panel     Component Value Date/Time   CHOL 119 06/13/2019 1425   CHOL 253 (H) 03/07/2016 0930   TRIG 217.0 (H) 06/13/2019 1425   HDL 32.10 (L) 06/13/2019 1425   HDL 32 (L)  03/07/2016 0930   CHOLHDL 4 06/13/2019 1425   VLDL 43.4 (H) 06/13/2019 1425   LDLCALC 78 12/01/2017 1404   LDLCALC 183 (H) 03/07/2016 0930   LDLDIRECT 61.0 06/13/2019 1425     Wt Readings from Last 3 Encounters:  02/16/20 141 lb 9.6 oz (64.2 kg)  01/02/20 144 lb 3.2 oz (65.4 kg)  10/27/19 145 lb 12.8 oz (66.1 kg)      Other studies Reviewed: Additional studies/ records that were reviewed today include: TTE 08/17/19 Review of the above records today demonstrates:  1. Left ventricular ejection fraction, by estimation, is 60 to 65%. The  left ventricle has normal function. The left ventricle has no regional  wall motion abnormalities. Left ventricular diastolic parameters are  indeterminate.  2. Right ventricular systolic function is moderately reduced. The right  ventricular size is moderately enlarged. moderately increased right  ventricular wall thickness. There is moderately elevated pulmonary artery  systolic pressure. The estimated right  ventricular systolic pressure is 16.9 mmHg.  3. Left atrial size was moderately dilated.  4. Right atrial size was moderately dilated.  5. The mitral valve is degenerative. Mild to moderate mitral valve  regurgitation.  6. Tricuspid valve regurgitation is severe.  7. The aortic valve is tricuspid. Aortic valve regurgitation is not  visualized. Mild to moderate aortic valve sclerosis/calcification is  present, without any evidence of aortic stenosis.  8. The inferior vena cava is dilated in size with <50%  respiratory  variability, suggesting right atrial pressure of 15 mmHg.   Holter 01/10/18 - personally reviewed Normal sinus rhythm Rare episodes of SVT/atrial tachycardia, 130 runs longest run 14 beats with rate 138 bpm Frequent PACs, 7%  Rare PVCs, 1% Rare PVCs and bigemeny  ASSESSMENT AND PLAN:  1.  SVT: EP study without inducible arrhythmia.  Currently on diltiazem.    2.  Hypertension:well controlled  3.  Coronary  artery disease status post CABG: No current chest pain.  Continue Plavix.    4.  Persistent atrial fibrillation: Currently on Xarelto and diltiazem.  CHA2DS2-VASc of 5.  She had a cardioversion a few months ago and remained in sinus rhythm for a few days.  She does not feel that she had much in the way of symptom changes.  Despite that, she does have more shortness of breath and fatigue and is also been requiring extra doses of Lasix.  I do think that this could be related to her atrial fibrillation.  She would potentially be interested in dofetilide loading.  I Andrea [name] have her follow-up with A. fib clinic in the middle of October to further discuss this.  Current medicines are reviewed at length with the patient today.   The patient does not have concerns regarding her medicines.  The following changes were made today: None  Labs/ tests ordered today include:  Orders Placed This Encounter  Procedures  . EKG 12-Lead     Disposition:   FU with Andrea [name] 6 months  Signed, Andrea [name] Meredith Leeds, MD  02/16/2020 11:06 AM     [facility] [address] [place] [address] [phone] (office) [phone] (fax)

## 2020-02-16 NOTE — Patient Instructions (Addendum)
Medication Instructions:  Please review the information on Tikosyn below.  Please call the office if this medication is not affordable.  Your physician recommends that you continue on your current medications as directed. Please refer to the Current Medication list given to you today.   *If you need a refill on your cardiac medications before your next appointment, please call your pharmacy*   Lab Work: None ordered If you have labs (blood work) drawn today and your tests are completely normal, you will receive your results only by: Marland Kitchen MyChart Message (if you have MyChart) OR . A paper copy in the mail If you have any lab test that is abnormal or we need to change your treatment, we will call you to review the results.   Testing/Procedures: None ordered   Follow-Up: At [facility], you and your health needs are our priority.  As part of our continuing mission to provide you with exceptional heart care, we have created designated Provider Care Teams.  These Care Teams include your primary Cardiologist (physician) and Advanced Practice Providers (APPs -  Physician Assistants and Nurse Practitioners) who all work together to provide you with the care you need, when you need it.  We recommend signing up for the patient portal called "MyChart".  Sign up information is provided on this After Visit Summary.  MyChart is used to connect with patients for Virtual Visits (Telemedicine).  Patients are able to view lab/test results, encounter notes, upcoming appointments, etc.  Non-urgent messages can be sent to your provider as well.   To learn more about what you can do with MyChart, go to [email or web address].    Your next appointment:   03/05/20 @ 9:30 am in the AFib clinic.  The parking code is: [place], [place] [phone]   Other Instructions  Preparing for Tikosyn (Dofetilide) Admission  1) Check with insurance company for cost of drug to ensure  affordability.  Dofetilide 500 mcg twice a day  2) Tikosyn admission requires a 3 day hospital stay with constant telemetry monitoring. You will have and EKG after each dose of Tikosyn.  If the drug does not convert you to normal rhythm a cardioversion will be performed prior to discharge.  3) A pharmacist will review all of your medications.  If there are any interactions noted we will be in touch with you.  4) Please ensure no missed doses of your anticoagulation (blood thinner) for the past 3 weeks prior to admission.  5) No Benadryl is allowed 3 days prior to admission.  6) On day of admission - you will come in for office visit in the [facility] where lab work will be drawn.  IF the labs are acceptable an inpatient bed will be requested.  7) Normally admission to a bed is straight from our office.  This is all dependent on bed availability.  In some instances you may be sent home and bed placement will call later in the day when a bed is available.  8) You may bring personal belongings/clothing with you to the hospital.  Please leave your suitcase in the care until you arrive in admissions.  Questions - please call the office at [phone]       Dofetilide capsules What is this medicine? DOFETILIDE (doe FET il ide) is an antiarrhythmic drug. It helps make your heart beat regularly. This medicine also helps to slow rapid heartbeats. This medicine may be used for other purposes; ask your health care provider  or pharmacist if you have questions. COMMON BRAND NAME(S): Tikosyn What should I tell my health care provider before I take this medicine? They need to know if you have any of these conditions:  heart disease  history of irregular heartbeat  history of low levels of potassium or magnesium in the blood  kidney disease  liver disease  an unusual or allergic reaction to dofetilide, other medicines, foods, dyes, or preservatives  pregnant or trying to get  pregnant  breast-feeding How should I use this medicine? Take this medicine by mouth with a glass of water. Follow the directions on the prescription label. Do not take with grapefruit juice. You can take it with or without food. If it upsets your stomach, take it with food. Take your medicine at regular intervals. Do not take it more often than directed. Do not stop taking except on your doctor's advice. A special MedGuide will be given to you by the pharmacist with each prescription and refill. Be sure to read this information carefully each time. Talk to your pediatrician regarding the use of this medicine in children. Special care may be needed. Overdosage: If you think you have taken too much of this medicine contact a poison control center or emergency room at once. NOTE: This medicine is only for you. Do not share this medicine with others. What if I miss a dose? If you miss a dose, skip it. Take your next dose at the normal time. Do not take extra or 2 doses at the same time to make up for the missed dose. What may interact with this medicine? Do not take this medicine with any of the following medications:  cimetidine  cisapride  dolutegravir  dronedarone  hydrochlorothiazide  ketoconazole  megestrol  pimozide  prochlorperazine  thioridazine  trimethoprim  verapamil This medicine may also interact with the following medications:  amiloride  cannabinoids  certain antibiotics like erythromycin or clarithromycin  certain antiviral medicines for HIV or hepatitis  certain medicines for depression, anxiety, or psychotic disorders  digoxin  diltiazem  grapefruit juice  metformin  nefazodone  other medicines that prolong the QT interval (an abnormal heart rhythm)  quinine  triamterene  zafirlukast  ziprasidone This list may not describe all possible interactions. Give your health care provider a list of all the medicines, herbs, non-prescription  drugs, or dietary supplements you use. Also tell them if you smoke, drink alcohol, or use illegal drugs. Some items may interact with your medicine. What should I watch for while using this medicine? Your condition will be monitored carefully while you are receiving this medicine. What side effects may I notice from receiving this medicine? Side effects that you should report to your doctor or health care professional as soon as possible:  allergic reactions like skin rash, itching or hives, swelling of the face, lips, or tongue  breathing problems  chest pain or chest tightness  dizziness  signs and symptoms of a dangerous change in heartbeat or heart rhythm like chest pain; dizziness; fast or irregular heartbeat; palpitations; feeling faint or lightheaded, falls; breathing problems  signs and symptoms of electrolyte imbalance like severe diarrhea, unusual sweating, vomiting, loss of appetite, increased thirst  swelling of the ankles, legs, or feet  tingling, numbness in the hands or feet Side effects that usually do not require medical attention (report to your doctor or health care professional if they continue or are bothersome):  diarrhea  general ill feeling or flu-like symptoms  headache  nausea  trouble sleeping  stomach pain This list may not describe all possible side effects. Call your doctor for medical advice about side effects. You may report side effects to FDA at 1-800-FDA-1088. Where should I keep my medicine? Keep out of the reach of children. Store at room temperature between 15 and 30 degrees C (59 and 86 degrees F). Throw away any unused medicine after the expiration date. NOTE: This sheet is a summary. It may not cover all possible information. If you have questions about this medicine, talk to your doctor, pharmacist, or health care provider.  2020 Elsevier/Gold Standard (2018-05-03 10:18:48)

## 2020-02-16 NOTE — Telephone Encounter (Signed)
Pt would like for Dr. Curt Bears or nurse to give a call. Please call back

## 2020-02-17 NOTE — Telephone Encounter (Signed)
Pt reports that she can afford Dofetilide.  Pt calling to report that she is going back to [place] to bury her husband and won't be back until 10/11, which is same day as her AFib clinic appt. Rescheduled pt to the following week, on 10/18. Patient verbalized understanding and agreeable to plan.

## 2020-02-22 DIAGNOSIS — J449 Chronic obstructive pulmonary disease, unspecified: Secondary | ICD-10-CM | POA: Diagnosis not present

## 2020-03-05 ENCOUNTER — Ambulatory Visit (HOSPITAL_COMMUNITY): Payer: Medicare HMO | Admitting: Physician Assistant

## 2020-03-06 DIAGNOSIS — L570 Actinic keratosis: Secondary | ICD-10-CM | POA: Diagnosis not present

## 2020-03-06 DIAGNOSIS — X32XXXA Exposure to sunlight, initial encounter: Secondary | ICD-10-CM | POA: Diagnosis not present

## 2020-03-06 DIAGNOSIS — D225 Melanocytic nevi of trunk: Secondary | ICD-10-CM | POA: Diagnosis not present

## 2020-03-06 DIAGNOSIS — D2262 Melanocytic nevi of left upper limb, including shoulder: Secondary | ICD-10-CM | POA: Diagnosis not present

## 2020-03-06 DIAGNOSIS — Z85828 Personal history of other malignant neoplasm of skin: Secondary | ICD-10-CM | POA: Diagnosis not present

## 2020-03-06 DIAGNOSIS — L565 Disseminated superficial actinic porokeratosis (DSAP): Secondary | ICD-10-CM | POA: Diagnosis not present

## 2020-03-06 DIAGNOSIS — D2261 Melanocytic nevi of right upper limb, including shoulder: Secondary | ICD-10-CM | POA: Diagnosis not present

## 2020-03-06 DIAGNOSIS — D2272 Melanocytic nevi of left lower limb, including hip: Secondary | ICD-10-CM | POA: Diagnosis not present

## 2020-03-12 ENCOUNTER — Encounter (HOSPITAL_COMMUNITY): Payer: Self-pay | Admitting: Physician Assistant

## 2020-03-12 ENCOUNTER — Ambulatory Visit (HOSPITAL_COMMUNITY)
Admission: RE | Admit: 2020-03-12 | Discharge: 2020-03-12 | Disposition: A | Discharge status: Home / Self Care | Payer: Medicare HMO | Source: Ambulatory Visit | Attending: Physician Assistant | Admitting: Physician Assistant

## 2020-03-12 ENCOUNTER — Other Ambulatory Visit: Payer: Self-pay

## 2020-03-12 VITALS — BP 132/70 | HR 68 | Ht 62.0 in | Wt 140.2 lb

## 2020-03-12 DIAGNOSIS — Z87891 Personal history of nicotine dependence: Secondary | ICD-10-CM | POA: Insufficient documentation

## 2020-03-12 DIAGNOSIS — Z7902 Long term (current) use of antithrombotics/antiplatelets: Secondary | ICD-10-CM | POA: Insufficient documentation

## 2020-03-12 DIAGNOSIS — Z8249 Family history of ischemic heart disease and other diseases of the circulatory system: Secondary | ICD-10-CM | POA: Insufficient documentation

## 2020-03-12 DIAGNOSIS — J449 Chronic obstructive pulmonary disease, unspecified: Secondary | ICD-10-CM | POA: Diagnosis not present

## 2020-03-12 DIAGNOSIS — Z7901 Long term (current) use of anticoagulants: Secondary | ICD-10-CM | POA: Diagnosis not present

## 2020-03-12 DIAGNOSIS — Z86711 Personal history of pulmonary embolism: Secondary | ICD-10-CM | POA: Insufficient documentation

## 2020-03-12 DIAGNOSIS — I4819 Other persistent atrial fibrillation: Secondary | ICD-10-CM | POA: Insufficient documentation

## 2020-03-12 DIAGNOSIS — M810 Age-related osteoporosis without current pathological fracture: Secondary | ICD-10-CM | POA: Insufficient documentation

## 2020-03-12 DIAGNOSIS — G4733 Obstructive sleep apnea (adult) (pediatric): Secondary | ICD-10-CM | POA: Insufficient documentation

## 2020-03-12 DIAGNOSIS — K219 Gastro-esophageal reflux disease without esophagitis: Secondary | ICD-10-CM | POA: Insufficient documentation

## 2020-03-12 DIAGNOSIS — Z79899 Other long term (current) drug therapy: Secondary | ICD-10-CM | POA: Insufficient documentation

## 2020-03-12 DIAGNOSIS — M1909 Primary osteoarthritis, other specified site: Secondary | ICD-10-CM | POA: Insufficient documentation

## 2020-03-12 DIAGNOSIS — I252 Old myocardial infarction: Secondary | ICD-10-CM | POA: Diagnosis not present

## 2020-03-12 DIAGNOSIS — I2510 Atherosclerotic heart disease of native coronary artery without angina pectoris: Secondary | ICD-10-CM | POA: Insufficient documentation

## 2020-03-12 DIAGNOSIS — F329 Major depressive disorder, single episode, unspecified: Secondary | ICD-10-CM | POA: Insufficient documentation

## 2020-03-12 DIAGNOSIS — D6869 Other thrombophilia: Secondary | ICD-10-CM | POA: Diagnosis not present

## 2020-03-12 DIAGNOSIS — I10 Essential (primary) hypertension: Secondary | ICD-10-CM | POA: Diagnosis not present

## 2020-03-12 DIAGNOSIS — E039 Hypothyroidism, unspecified: Secondary | ICD-10-CM | POA: Diagnosis not present

## 2020-03-12 DIAGNOSIS — I739 Peripheral vascular disease, unspecified: Secondary | ICD-10-CM | POA: Insufficient documentation

## 2020-03-12 DIAGNOSIS — E785 Hyperlipidemia, unspecified: Secondary | ICD-10-CM | POA: Insufficient documentation

## 2020-03-12 LAB — BASIC METABOLIC PANEL
Anion gap: 7 (ref 5–15)
BUN: 9 mg/dL (ref 8–23)
CO2: 28 mmol/L (ref 22–32)
Calcium: 9.4 mg/dL (ref 8.9–10.3)
Chloride: 106 mmol/L (ref 98–111)
Creatinine, Ser: 0.8 mg/dL (ref 0.44–1.00)
GFR, Estimated: >60 mL/min (ref >60)
Glucose, Bld: 104 mg/dL — ABNORMAL HIGH (ref 70–99)
Potassium: 4.3 mmol/L (ref 3.5–5.1)
Sodium: 141 mmol/L (ref 135–145)

## 2020-03-12 LAB — MAGNESIUM: Magnesium: 2.1 mg/dL (ref 1.7–2.4)

## 2020-03-12 NOTE — Progress Notes (Signed)
Primary Care Physician: Mosie Lukes, MD Primary Cardiologist: Dr Rockey Situ Primary Electrophysiologist: Dr Curt Bears Referring Physician: [facility]   Andrea [name] is a 79 y.o. female with a history ofcoronary disease status post CABG in 1999 status post PCI to the distal RCA in 2013, PCI x2 to the SVG to the RCA into the mid circumflex in 2017, PVD status post lower extremity stenting, carotid artery disease status post left-sided CEA, pulmonary hypertension, valvular heart disease, COPD, infrarenal AAA, PE, hypertension, hyperlipidemia, asthma, hypothyroidism, SVT/atrial tach, and persistent atrial fibrillation who presents for follow up in the [facility].  The patient was initially diagnosed with atrial fibrillation on 11/07/18 after presenting to the ER with symptoms of palpitations, dyspnea, and chest discomfort. At the ER, she was found to be in rate controlled afib. She was started on Xarelto. Patient was hospitalized for a COPD exacerbation 07/2019 and was found to be in afib at that time. Echo 08/16/19 showed preserved EF with mod dilated LA. She saw Dr Curt Bears on 09/22/19 and DCCV was recommended. Patient is s/p DCCV 10/19/19. After the procedure, she reports that she did not feel improved in SR. She saw Dr Curt Bears 02/16/20 and complained of increased fatigue, weakness, and edema. These were felt to be 2/2 her afib despite rate control.   On follow up today, patient reports that she continues to have weakness and fatigue. She is in rate controlled afib today. She denies any bleeding issues on anticoagulation. No symptoms of fluid overload today.   Today, she denies symptoms of palpitations, chest pain, orthopnea, PND, dizziness, presyncope, syncope, bleeding, or neurologic sequela. The patient is tolerating medications without difficulties and is otherwise without complaint today.    Atrial Fibrillation Risk Factors:  she does have symptoms or diagnosis of  sleep apnea. she does not have a history of rheumatic fever. she does not have a history of alcohol use. The patient does not have a history of early familial atrial fibrillation or other arrhythmias.  she has a BMI of Body mass index is 25.64 kg/m.Marland Kitchen Filed Weights   03/12/20 0902  Weight: 63.6 kg    Family History  Problem Relation Age of Onset   Heart attack Mother 64   Diabetes Mother    Colon cancer Father 34   Lung cancer Father        smoked   Heart disease Father    Hypertension Father    Angina Father    Lung cancer Sister        smoked   Hypothyroidism Sister    Hypertension Brother    Heart attack Brother    Heart disease Brother        PTCA with Stent   Heart attack Brother        CABG   Heart disease Brother        CABG with 1 bypass   Aneurysm Brother        brain   Alcohol abuse Brother    Hyperlipidemia Daughter    Diabetes Maternal Grandmother    Stroke Maternal Grandmother    Cirrhosis Maternal Grandfather    Stroke Paternal Grandmother    Obesity Daughter    Obesity Son      Atrial Fibrillation Management history:  Previous antiarrhythmic drugs: Multaq (stopped 2/2 side effects) Previous cardioversions: 10/19/19 Previous ablations: EP study 03/19/18 (tachy not inducible. SVT) CHADS2VASC score: 5 Anticoagulation history: Xarelto    Past Medical History:  Diagnosis Date  AAA (abdominal aortic aneurysm) ([place]) 01/2009   AAA (2.8 x 3.0)  moderate RAS (left); 2.7 x 2.7 cm (07/11/05)   Acute bronchitis 03/20/2013; 2017   Angina    Anosmia    Asthma    Baker's cyst of knee 01/30/2014   Basal cell carcinoma    "back and left arm"   Bradycardia    Metoprolol stopped 08/2011   CAD (coronary artery disease)    s/CABG (reports IMA and 2 SVGs) back in 1999  Myoview normal 3/10; s/p PCI with DES to PL branch of distal RCA 09/2011; PCI +DES to SVG-RCA, PCI + DES to mid LCx 12/2015   Cerebrovascular disease 01/2009    carotid u/s  R 0-39%   L 60-79%   Chronic thoracic back pain    COPD (chronic obstructive pulmonary disease) (HCC)    Depression    Dizziness    Dysrhythmia    hx of sinus brady   GERD (gastroesophageal reflux disease)    Heart murmur    Herniated lumbar disc without myelopathy    History of blood transfusion 1999   "when I had the bypass; had a PE"   Hyperglycemia 10/23/2015   Hyperlipidemia    Hypertension    Hypothyroidism 09/30/2016   Medicare annual wellness visit, subsequent 07/31/2013   NSTEMI (non-ST elevated myocardial infarction) ([place]) 11/12   Cath showed atretic IMA graft to the LAD, SVG to PD was patent but the continuation of this graft to the PL branch was occluded; there were L to R collaterals; Mid LAD had a 60 to 70% stenosis. She has been treated medically.  Neg Myoview 05/2011   Osteoarthritis of back    Osteoporosis    Pneumonia "several times"   Pulmonary embolism ([place]) 1999   "after my bypass"   PVD (peripheral vascular disease) ([place])    Shortness of breath    Squamous carcinoma    "nose"   Thyroid disease    Hypothyroid   Unstable angina ([place]) 09/25/2015   Past Surgical History:  Procedure Laterality Date   ABDOMINAL AORTIC ANEURYSM REPAIR     pt denies this hx on 01/02/2016   BASAL CELL CARCINOMA EXCISION     CARDIAC CATHETERIZATION N/A 01/02/2016   Procedure: Left Heart Cath and Coronary Angiography;  Surgeon: Wellington Hampshire, MD;  Location: [facility];  Service: Cardiovascular;  Laterality: N/A;   CARDIAC CATHETERIZATION  1996; 1999   CARDIAC CATHETERIZATION N/A 06/25/2016   Procedure: Left Heart Cath and Cors/Grafts Angiography;  Surgeon: Wellington Hampshire, MD;  Location: [place] [facility];  Service: Cardiovascular;  Laterality: N/A;   CARDIOVERSION N/A 10/19/2019   Procedure: CARDIOVERSION;  Surgeon: Jerline Pain, MD;  Location: [facility] ENDOSCOPY;  Service: Cardiovascular;  Laterality: N/A;   CAROTID ENDARTERECTOMY Left  01/02/2011   CATARACT EXTRACTION W/ INTRAOCULAR LENS  IMPLANT, BILATERAL     CLOSED REDUCTION NASAL FRACTURE  11/2007   CORONARY ANGIOPLASTY WITH STENT PLACEMENT  10/10/2011   drug eluting  to rc & saphenous   CORONARY ARTERY BYPASS GRAFT  1999   "CABG X3"   DILATION AND CURETTAGE OF UTERUS     FEMORAL ARTERY STENT Bilateral    FRACTURE SURGERY     LEFT HEART CATHETERIZATION WITH CORONARY/GRAFT ANGIOGRAM N/A 04/22/2011   Procedure: LEFT HEART CATHETERIZATION WITH Beatrix Fetters;  Surgeon: Jolaine Artist, MD;  Location[facility] C[facility] LAB;  Service: Cardiovascular;  Laterality: N/A;   LEFT HEART CATHETERIZATION WITH CORONARY/GRAFT ANGIOGRAM N/A 10/10/2011  Procedure: LEFT HEART CATHETERIZATION WITH Beatrix Fetters;  Surgeon: Peter M Martinique, MD;  Location: [facility] CATH LAB;  Service: Cardiovascular;  Laterality: N/A;   NASAL SINUS SURGERY     twice   SQUAMOUS CELL CARCINOMA EXCISION     SVT ABLATION N/A 03/19/2018   Procedure: SVT ABLATION;  Surgeon: Constance Haw, MD;  Location: [place] [facility];  Service: Cardiovascular;  Laterality: N/A;   TUBAL LIGATION      Current Outpatient Medications  Medication Sig Dispense Refill   acetaminophen (TYLENOL) 650 MG CR tablet Take 1,300 mg by mouth 2 (two) times daily.     albuterol (PROVENTIL) (2.5 MG/3ML) 0.083% nebulizer solution Take 3 mLs (2.5 mg total) by nebulization every 4 (four) hours as needed for wheezing or shortness of breath. Dx:J44.9 75 mL 12   albuterol (VENTOLIN HFA) 108 (90 Base) MCG/ACT inhaler INHALE 2 PUFFS INTO THE LUNGS EVERY 6 (SIX) HOURS AS NEEDED FOR WHEEZING. 18 g 5   amLODipine (NORVASC) 5 MG tablet TAKE 1 TABLET BY MOUTH ONCE DAILY 90 tablet 3   azelastine (ASTELIN) 0.1 % nasal spray Place 2 sprays into both nostrils at bedtime as needed for rhinitis. 30 mL 3   Biotin 5 MG TABS Take 5 mg by mouth daily.      Calcium Carbonate-Vitamin D (CALCIUM + D PO) Take 1 tablet by mouth daily.       Cholecalciferol (VITAMIN D3) 2000 units TABS Take 2,000 Units by mouth daily.      clopidogrel (PLAVIX) 75 MG tablet TAKE 1 TABLET BY MOUTH ONCE A DAY 90 tablet 0   diltiazem (CARDIZEM CD) 240 MG 24 hr capsule Take 1 capsule (240 mg total) by mouth daily. 90 capsule 2   diltiazem (CARDIZEM) 30 MG tablet Take one tablet by mouth every 4 hours AS NEEDED for A-fib HR > 100 as long as BP > 100. 45 tablet 0   ezetimibe (ZETIA) 10 MG tablet TAKE 1 TABLET BY MOUTH DAILY 90 tablet 0   Fluticasone-Umeclidin-Vilant (TRELEGY ELLIPTA) 100-62.5-25 MCG/INH AEPB Inhale 1 puff into the lungs daily. 60 each 5   furosemide (LASIX) 20 MG tablet Take 1 tablet (20 mg total) by mouth as directed. Take 1 tablet (20 mg) daily and extra 1 tablet (20 mg) at 2 PM for shortness of breath or abdominal swelling 180 tablet 2   HYDROcodone-acetaminophen (NORCO) 10-325 MG tablet Take 1 tablet by mouth every 6 (six) hours as needed for severe pain (pain.).      isosorbide mononitrate (IMDUR) 60 MG 24 hr tablet TAKE 1 TABLET BY MOUTH TWICE A DAY 180 tablet 3   Krill Oil 500 MG CAPS Take 500 mg by mouth daily.      levothyroxine (SYNTHROID) 50 MCG tablet TAKE 1 TABLET BY MOUTH ONCE DAILY 90 tablet 3   nitroGLYCERIN (NITROSTAT) 0.4 MG SL tablet Place 1 tablet (0.4 mg total) under the tongue every 5 (five) minutes as needed for chest pain. 25 tablet 0   omeprazole (PRILOSEC) 20 MG capsule TAKE 1 CAPSULE BY MOUTH ONCE DAILY 30 capsule 5   Polyethyl Glycol-Propyl Glycol (SYSTANE OP) Place 1 drop into both eyes 2 (two) times daily as needed (dry/irritated eyes.).      rivaroxaban (XARELTO) 20 MG TABS tablet Take 1 tablet (20 mg total) by mouth daily with supper. 30 tablet 6   rosuvastatin (CRESTOR) 40 MG tablet TAKE 1 TABLET BY MOUTH ONCE DAILY 90 tablet 2   sertraline (ZOLOFT) 100 MG tablet  Take 1 tablet (100 mg total) by mouth daily. 90 tablet 3   Vitamin D, Ergocalciferol, (DRISDOL) 1.25 MG (50000 UNIT) CAPS  capsule TAKE 1 CAPSULE (50,000 UNITS) BY MOUTH ONCE A WEEK. 12 capsule 1   zinc gluconate 50 MG tablet Take 50 mg by mouth daily.     No current facility-administered medications for this encounter.    Allergies  Allergen Reactions   Erythromycin Other (See Comments)    Tongue burns   Meperidine Nausea And Vomiting   Shellfish Allergy Nausea And Vomiting   Ciprofloxacin Rash and Other (See Comments)    Rash from IV   Codeine Nausea And Vomiting   Penicillins Rash and Other (See Comments)    Has patient had a PCN reaction causing immediate rash, facial/tongue/throat swelling, SOB or lightheadedness with hypotension:unsure Has patient had a PCN reaction causing severe rash involving mucus membranes or skin necrosis:No Has patient had a PCN reaction that required hospitalization:No Has patient had a PCN reaction occurring within the last 10 years:No If all of the above answers are "NO", then may proceed with Cephalosporin use.    Tape Rash    pls use paper tape    Social History   Socioeconomic History   Marital status: Married    Spouse name: Not on file   Number of children: 5   Years of education: Not on file   Highest education level: Not on file  Occupational History   Occupation: Retired    Fish farm manager: RETIRED    Comment: former  Marine scientist  Tobacco Use   Smoking status: Former Smoker    Packs/day: 0.50    Years: 40.00    Pack years: 20.00    Types: Cigarettes    Quit date: 01/19/1993    Years since quitting: 27.1   Smokeless tobacco: Never Used  Vaping Use   Vaping Use: Never used  Substance and Sexual Activity   Alcohol use: Yes    Comment: 01/02/2016 "might drink a glass of wine socially q 3-4 months"   Drug use: No   Sexual activity: Not Currently    Birth control/protection: Post-menopausal  Other Topics Concern   Not on file  Social History Narrative   Married with 5 children   Social Determinants of Health   Financial Resource Strain:     Difficulty of Paying Living Expenses: Not on file  Food Insecurity:    Worried About Charity fundraiser in the Last Year: Not on file   [employer] of Food in the Last Year: Not on file  Transportation Needs:    Lack of Transportation (Medical): Not on file   Lack of Transportation (Non-Medical): Not on file  Physical Activity:    Days of Exercise per Week: Not on file   Minutes of Exercise per Session: Not on file  Stress:    Feeling of Stress : Not on file  Social Connections:    Frequency of Communication with Friends and Family: Not on file   Frequency of Social Gatherings with Friends and Family: Not on file   Attends Religious Services: Not on file   Active Member of Clubs or Organizations: Not on file   Attends Archivist Meetings: Not on file   Marital Status: Not on file  Intimate Partner Violence:    Fear of Current or Ex-Partner: Not on file   Emotionally Abused: Not on file   Physically Abused: Not on file   Sexually Abused: Not on file  ROS- All systems are reviewed and negative except as per the HPI above.  Physical Exam: Vitals:   03/12/20 0902  BP: 132/70  Pulse: 68  Weight: 63.6 kg  Height: 5\' 2"  (1.575 m)    GEN- The patient is well appearing elderly female, alert and oriented x 3 today.   HEENT-head normocephalic, atraumatic, sclera clear, conjunctiva pink, hearing intact, trachea midline. Lungs- Clear to ausculation bilaterally, normal work of breathing Heart- irregular rate and rhythm, no murmurs, rubs or gallops  GI- soft, NT, ND, + BS Extremities- no clubbing, cyanosis, or edema MS- no significant deformity or atrophy Skin- no rash or lesion Psych- euthymic mood, full affect Neuro- strength and sensation are intact   Wt Readings from Last 3 Encounters:  03/12/20 63.6 kg  02/16/20 64.2 kg  01/02/20 65.4 kg    EKG today demonstrates afib HR 68, QRS 86, QTc 429  Echo 08/16/19 demonstrated  1. Left ventricular  ejection fraction, by estimation, is 60 to 65%. The  left ventricle has normal function. The left ventricle has no regional  wall motion abnormalities. Left ventricular diastolic parameters are  indeterminate.  2. Right ventricular systolic function is moderately reduced. The right  ventricular size is moderately enlarged. moderately increased right  ventricular wall thickness. There is moderately elevated pulmonary artery  systolic pressure. The estimated right  ventricular systolic pressure is 48.0 mmHg.  3. Left atrial size was moderately dilated.  4. Right atrial size was moderately dilated.  5. The mitral valve is degenerative. Mild to moderate mitral valve  regurgitation.  6. Tricuspid valve regurgitation is severe.  7. The aortic valve is tricuspid. Aortic valve regurgitation is not  visualized. Mild to moderate aortic valve sclerosis/calcification is  present, without any evidence of aortic stenosis.  8. The inferior vena cava is dilated in size with <50% respiratory  variability, suggesting right atrial pressure of 15 mmHg.   Epic records are reviewed at length today  Assessment and Plan:  1. Persistent atrial fibrillation Patient's AAD options are limited: did not tolerate Multaq 2/2 GI side effects, class IC contraindicated with CAD, would also avoid amiodarone given lung disease.  Patient wants to pursue dofetilide, aware of risk vrs benefit Patient has checked on price of dofetilide. Will forward medication list to PharmD to screen for QT prolonging agents.  QTc in SR 438 ms Check bmet/mag today. Continue diltiazem 180 mg daily and 30 mg PRN q 4hrs for heart racing. Continue Xarelto 20 mg daily.  This patients CHA2DS2-VASc Score and unadjusted Ischemic Stroke Rate (% per year) is equal to 7.2 % stroke rate/year from a score of 5  Above score calculated as 1 point each if present [CHF, HTN, DM, Vascular=MI/PAD/Aortic Plaque, Age if 65-74, or Female] Above score  calculated as 2 points each if present [Age > 75, or Stroke/TIA/TE]  2. HTN Stable, no changes today.  3. Obstructive sleep apnea Encouraged compliance with CPAP therapy.  4. CAD s/p CABG No anginal symptoms.   Follow up in the AF clinic in 4-6 weeks. Patient will be out of town, will consider dofetilide admission once she gets back.    [facility] [address] [place], [place] [address] [phone] 03/12/2020 9:30 AM

## 2020-03-22 ENCOUNTER — Other Ambulatory Visit: Payer: Self-pay | Admitting: Family Medicine

## 2020-03-23 DIAGNOSIS — J449 Chronic obstructive pulmonary disease, unspecified: Secondary | ICD-10-CM | POA: Diagnosis not present

## 2020-04-10 ENCOUNTER — Encounter (HOSPITAL_COMMUNITY): Payer: Self-pay | Admitting: Emergency Medicine

## 2020-04-10 ENCOUNTER — Ambulatory Visit: Payer: Medicare HMO | Admitting: Emergency Medicine

## 2020-04-10 ENCOUNTER — Emergency Department (HOSPITAL_COMMUNITY): Payer: Medicare HMO

## 2020-04-10 ENCOUNTER — Other Ambulatory Visit: Payer: Self-pay

## 2020-04-10 ENCOUNTER — Emergency Department (HOSPITAL_COMMUNITY)
Admission: EM | Admit: 2020-04-10 | Discharge: 2020-04-10 | Disposition: A | Discharge status: Home / Self Care | Payer: Medicare HMO | Attending: Emergency Medicine | Admitting: Emergency Medicine

## 2020-04-10 DIAGNOSIS — I517 Cardiomegaly: Secondary | ICD-10-CM | POA: Diagnosis not present

## 2020-04-10 DIAGNOSIS — Z87891 Personal history of nicotine dependence: Secondary | ICD-10-CM | POA: Diagnosis not present

## 2020-04-10 DIAGNOSIS — R079 Chest pain, unspecified: Secondary | ICD-10-CM

## 2020-04-10 DIAGNOSIS — I701 Atherosclerosis of renal artery: Secondary | ICD-10-CM | POA: Diagnosis not present

## 2020-04-10 DIAGNOSIS — E039 Hypothyroidism, unspecified: Secondary | ICD-10-CM | POA: Diagnosis not present

## 2020-04-10 DIAGNOSIS — Z86711 Personal history of pulmonary embolism: Secondary | ICD-10-CM | POA: Diagnosis not present

## 2020-04-10 DIAGNOSIS — K219 Gastro-esophageal reflux disease without esophagitis: Secondary | ICD-10-CM | POA: Insufficient documentation

## 2020-04-10 DIAGNOSIS — K449 Diaphragmatic hernia without obstruction or gangrene: Secondary | ICD-10-CM | POA: Diagnosis not present

## 2020-04-10 DIAGNOSIS — I7090 Unspecified atherosclerosis: Secondary | ICD-10-CM

## 2020-04-10 DIAGNOSIS — I482 Chronic atrial fibrillation, unspecified: Secondary | ICD-10-CM

## 2020-04-10 DIAGNOSIS — Z9861 Coronary angioplasty status: Secondary | ICD-10-CM | POA: Insufficient documentation

## 2020-04-10 DIAGNOSIS — Z7902 Long term (current) use of antithrombotics/antiplatelets: Secondary | ICD-10-CM | POA: Insufficient documentation

## 2020-04-10 DIAGNOSIS — M25511 Pain in right shoulder: Secondary | ICD-10-CM | POA: Diagnosis not present

## 2020-04-10 DIAGNOSIS — I25720 Atherosclerosis of autologous artery coronary artery bypass graft(s) with unstable angina pectoris: Secondary | ICD-10-CM | POA: Insufficient documentation

## 2020-04-10 DIAGNOSIS — Z951 Presence of aortocoronary bypass graft: Secondary | ICD-10-CM | POA: Insufficient documentation

## 2020-04-10 DIAGNOSIS — Z7901 Long term (current) use of anticoagulants: Secondary | ICD-10-CM | POA: Insufficient documentation

## 2020-04-10 DIAGNOSIS — J441 Chronic obstructive pulmonary disease with (acute) exacerbation: Secondary | ICD-10-CM

## 2020-04-10 DIAGNOSIS — K5730 Diverticulosis of large intestine without perforation or abscess without bleeding: Secondary | ICD-10-CM | POA: Diagnosis not present

## 2020-04-10 DIAGNOSIS — I119 Hypertensive heart disease without heart failure: Secondary | ICD-10-CM | POA: Insufficient documentation

## 2020-04-10 DIAGNOSIS — Z20822 Contact with and (suspected) exposure to covid-19: Secondary | ICD-10-CM | POA: Diagnosis not present

## 2020-04-10 DIAGNOSIS — I499 Cardiac arrhythmia, unspecified: Secondary | ICD-10-CM | POA: Diagnosis not present

## 2020-04-10 DIAGNOSIS — J90 Pleural effusion, not elsewhere classified: Secondary | ICD-10-CM | POA: Diagnosis not present

## 2020-04-10 DIAGNOSIS — Z79899 Other long term (current) drug therapy: Secondary | ICD-10-CM | POA: Diagnosis not present

## 2020-04-10 DIAGNOSIS — M25512 Pain in left shoulder: Secondary | ICD-10-CM | POA: Diagnosis not present

## 2020-04-10 DIAGNOSIS — J9811 Atelectasis: Secondary | ICD-10-CM | POA: Diagnosis not present

## 2020-04-10 DIAGNOSIS — R0602 Shortness of breath: Secondary | ICD-10-CM | POA: Insufficient documentation

## 2020-04-10 DIAGNOSIS — R0789 Other chest pain: Secondary | ICD-10-CM | POA: Diagnosis not present

## 2020-04-10 DIAGNOSIS — J449 Chronic obstructive pulmonary disease, unspecified: Secondary | ICD-10-CM | POA: Diagnosis not present

## 2020-04-10 DIAGNOSIS — I774 Celiac artery compression syndrome: Secondary | ICD-10-CM | POA: Diagnosis not present

## 2020-04-10 LAB — HEPATIC FUNCTION PANEL
ALT: 12 U/L (ref 0–44)
AST: 16 U/L (ref 15–41)
Albumin: 3.3 g/dL — ABNORMAL LOW (ref 3.5–5.0)
Alkaline Phosphatase: 75 U/L (ref 38–126)
Bilirubin, Direct: 0.2 mg/dL (ref 0.0–0.2)
Indirect Bilirubin: 0.3 mg/dL (ref 0.3–0.9)
Total Bilirubin: 0.5 mg/dL (ref 0.3–1.2)
Total Protein: 6.2 g/dL — ABNORMAL LOW (ref 6.5–8.1)

## 2020-04-10 LAB — BASIC METABOLIC PANEL
Anion gap: 9 (ref 5–15)
BUN: 8 mg/dL (ref 8–23)
CO2: 27 mmol/L (ref 22–32)
Calcium: 8.9 mg/dL (ref 8.9–10.3)
Chloride: 105 mmol/L (ref 98–111)
Creatinine, Ser: 0.98 mg/dL (ref 0.44–1.00)
GFR, Estimated: 58 mL/min — ABNORMAL LOW (ref >60)
Glucose, Bld: 121 mg/dL — ABNORMAL HIGH (ref 70–99)
Potassium: 3.6 mmol/L (ref 3.5–5.1)
Sodium: 141 mmol/L (ref 135–145)

## 2020-04-10 LAB — CBC
HCT: 35.6 % — ABNORMAL LOW (ref 36.0–46.0)
Hemoglobin: 10.7 g/dL — ABNORMAL LOW (ref 12.0–15.0)
MCH: 24.8 pg — ABNORMAL LOW (ref 26.0–34.0)
MCHC: 30.1 g/dL (ref 30.0–36.0)
MCV: 82.6 fL (ref 80.0–100.0)
Platelets: 146 10*3/uL — ABNORMAL LOW (ref 150–400)
RBC: 4.31 MIL/uL (ref 3.87–5.11)
RDW: 17.1 % — ABNORMAL HIGH (ref 11.5–15.5)
WBC: 8.5 10*3/uL (ref 4.0–10.5)
nRBC: 0 % (ref 0.0–0.2)

## 2020-04-10 LAB — LIPASE, BLOOD: Lipase: 26 U/L (ref 11–51)

## 2020-04-10 LAB — RESPIRATORY PANEL BY RT PCR (FLU A&B, COVID)
Influenza A by PCR: NEGATIVE
Influenza B by PCR: NEGATIVE
SARS Coronavirus 2 by RT PCR: NEGATIVE

## 2020-04-10 LAB — PROTIME-INR
INR: 2.8 — ABNORMAL HIGH (ref 0.8–1.2)
Prothrombin Time: 28.2 seconds — ABNORMAL HIGH (ref 11.4–15.2)

## 2020-04-10 LAB — TROPONIN I (HIGH SENSITIVITY)
Troponin I (High Sensitivity): 14 ng/L (ref <18)
Troponin I (High Sensitivity): 15 ng/L (ref <18)

## 2020-04-10 MED ORDER — METHYLPREDNISOLONE SODIUM SUCC 125 MG IJ SOLR
125.0000 mg | Freq: Once | INTRAMUSCULAR | Status: AC
  Administered 2020-04-10: 125 mg via INTRAVENOUS
  Filled 2020-04-10: qty 2

## 2020-04-10 MED ORDER — PANTOPRAZOLE SODIUM 40 MG IV SOLR
40.0000 mg | Freq: Once | INTRAVENOUS | Status: AC
  Administered 2020-04-10: 40 mg via INTRAVENOUS
  Filled 2020-04-10: qty 40

## 2020-04-10 MED ORDER — ALBUTEROL SULFATE (5 MG/ML) 0.5% IN NEBU: 2.5000 mg | INHALATION_SOLUTION | Freq: Four times a day (QID) | RESPIRATORY_TRACT | 2 refills | Status: DC | PRN

## 2020-04-10 MED ORDER — IPRATROPIUM BROMIDE 0.02 % IN SOLN: 0.5000 mg | Freq: Four times a day (QID) | RESPIRATORY_TRACT | 2 refills | Status: DC

## 2020-04-10 MED ORDER — DILTIAZEM HCL 30 MG PO TABS
30.0000 mg | ORAL_TABLET | Freq: Once | ORAL | Status: AC
  Administered 2020-04-10: 30 mg via ORAL
  Filled 2020-04-10: qty 1

## 2020-04-10 MED ORDER — IPRATROPIUM-ALBUTEROL 0.5-2.5 (3) MG/3ML IN SOLN
3.0000 mL | Freq: Once | RESPIRATORY_TRACT | Status: AC
  Administered 2020-04-10: 3 mL via RESPIRATORY_TRACT
  Filled 2020-04-10: qty 3

## 2020-04-10 MED ORDER — IOHEXOL 350 MG/ML SOLN
100.0000 mL | Freq: Once | INTRAVENOUS | Status: AC | PRN
  Administered 2020-04-10: 100 mL via INTRAVENOUS

## 2020-04-10 MED ORDER — PREDNISONE 20 MG PO TABS: ORAL_TABLET | ORAL | 0 refills | Status: DC

## 2020-04-10 MED ORDER — MORPHINE SULFATE (PF) 2 MG/ML IV SOLN
2.0000 mg | Freq: Once | INTRAVENOUS | Status: AC
  Administered 2020-04-10: 2 mg via INTRAVENOUS
  Filled 2020-04-10: qty 1

## 2020-04-10 MED ORDER — DILTIAZEM HCL ER COATED BEADS 240 MG PO CP24
240.0000 mg | ORAL_CAPSULE | Freq: Once | ORAL | Status: AC
  Administered 2020-04-10: 240 mg via ORAL
  Filled 2020-04-10 (×2): qty 1

## 2020-04-10 MED ORDER — CLOPIDOGREL BISULFATE 75 MG PO TABS
75.0000 mg | ORAL_TABLET | Freq: Once | ORAL | Status: AC
  Administered 2020-04-10: 75 mg via ORAL
  Filled 2020-04-10: qty 1

## 2020-04-10 MED ORDER — ALBUTEROL SULFATE HFA 108 (90 BASE) MCG/ACT IN AERS
2.0000 | INHALATION_SPRAY | Freq: Once | RESPIRATORY_TRACT | Status: AC
  Administered 2020-04-10: 2 via RESPIRATORY_TRACT
  Filled 2020-04-10: qty 6.7

## 2020-04-10 MED ORDER — ISOSORBIDE MONONITRATE ER 30 MG PO TB24
60.0000 mg | ORAL_TABLET | Freq: Every day | ORAL | Status: DC
  Administered 2020-04-10: 60 mg via ORAL
  Filled 2020-04-10: qty 2

## 2020-04-10 NOTE — ED Notes (Signed)
Pt provided with water and crackers

## 2020-04-10 NOTE — ED Notes (Signed)
PT returned from CT

## 2020-04-10 NOTE — ED Provider Notes (Addendum)
[facility] EMERGENCY DEPARTMENT Provider Note   CSN: [id number] Arrival date & time: 04/10/20  5027     History Chief Complaint  Patient presents with  . Chest Pain    Andrea [name] is a 79 y.o. female.  HPI Andrea [name] is a 79 y.o. female with a history ofcoronary disease status post CABG in 1999 status post PCI to the distal RCA in 2013, PCI x2 to the SVG to the RCA into the mid circumflex in 2017, PVD status post lower extremity stenting, carotid artery disease status post left-sided CEA, pulmonary hypertension, valvular heart disease, COPD, infrarenal AAA, PE, hypertension, hyperlipidemia, asthma, hypothyroidism, SVT/atrial tach, and persistent atrial fibrillation   Patient reports that this morning she developed severe pain that is up under both ribs and going into her shoulders.  She reports her arms feel heavy.  She has aching quality that she illustrates from her epigastrium out beneath her rib cage bilaterally.  She reports pain radiates into her back.  Quality is both aching and sharp.  She reports she cannot get comfortable.  She reports it was much worse trying to lie flat earlier this morning.  She reports she also feels very short of breath.  Shortness of breath has been worsening somewhat incrementally over the past week or more.  She denies has had any significant increase in baseline mild cough.  No fevers or body aches.  No lower extremity swelling or calf pain.  Has underlying COPD and chronic A. fib anticoagulated and rate controlled.  Patient reports that she is actually felt worse in terms of fatigue and shortness of breath since her atrial fibrillation diagnosis and treatment.  She reports prior to A. fib, her baseline heart rate was in the 40s and 50s.  She reports she felt better at that time.  She reports even when her heart rate is controlled with the A. fib she does not feel well.  Patient took her medications last night.  She has not taken her  a.m. medications.  She ate oatmeal for dinner last night.  She has not eaten this morning.  Patient has been immunized for Covid.  Patient reports he is compliant with her medications.  She takes Plavix in the morning and Xarelto at night for anticoagulation.    Past Medical History:  Diagnosis Date  . AAA (abdominal aortic aneurysm) ([place]) 01/2009   AAA (2.8 x 3.0)  moderate RAS (left); 2.7 x 2.7 cm (07/11/05)  . Acute bronchitis 03/20/2013; 2017  . Angina   . Anosmia   . Asthma   . Baker's cyst of knee 01/30/2014  . Basal cell carcinoma    "back and left arm"  . Bradycardia    Metoprolol stopped 08/2011  . CAD (coronary artery disease)    s/CABG (reports IMA and 2 SVGs) back in 1999  Myoview normal 3/10; s/p PCI with DES to PL branch of distal RCA 09/2011; PCI +DES to SVG-RCA, PCI + DES to mid LCx 12/2015  . Cerebrovascular disease 01/2009   carotid u/s  R 0-39%   L 60-79%  . Chronic thoracic back pain   . COPD (chronic obstructive pulmonary disease) ([place])   . Depression   . Dizziness   . Dysrhythmia    hx of sinus brady  . GERD (gastroesophageal reflux disease)   . Heart murmur   . Herniated lumbar disc without myelopathy   . History of blood transfusion 1999   "when I had the bypass; had a  PE"  . Hyperglycemia 10/23/2015  . Hyperlipidemia   . Hypertension   . Hypothyroidism 09/30/2016  . Medicare annual wellness visit, subsequent 07/31/2013  . NSTEMI (non-ST elevated myocardial infarction) ([place]) 11/12   Cath showed atretic IMA graft to the LAD, SVG to PD was patent but the continuation of this graft to the PL branch was occluded; there were L to R collaterals; Mid LAD had a 60 to 70% stenosis. She has been treated medically.  Neg Myoview 05/2011  . Osteoarthritis of back   . Osteoporosis   . Pneumonia "several times"  . Pulmonary embolism ([place]) 1999   "after my bypass"  . PVD (peripheral vascular disease) ([place])   . Shortness of breath   . Squamous carcinoma    "nose"  . Thyroid  disease    Hypothyroid  . Unstable angina ([place]) 09/25/2015    Patient Active Problem List   Diagnosis Date Noted  . PAF (paroxysmal atrial fibrillation) ([place])   . Persistent atrial fibrillation ([place]) 10/07/2019  . Secondary hypercoagulable state ([place]) 10/07/2019  . Cor pulmonale, chronic ([place]) 08/17/2019  . Acute on chronic diastolic CHF (congestive heart failure) ([place]) 08/17/2019  . COPD (chronic obstructive pulmonary disease) ([place]) 08/14/2019  . Chronic hypoxemic respiratory failure ([place]) 08/12/2019  . Atrial fibrillation, chronic ([place]) 08/12/2019  . OSA (obstructive sleep apnea) 08/12/2019  . Vitamin D deficiency 06/13/2019  . Witnessed episode of apnea 03/07/2018  . Cervical cancer screening 12/09/2017  . Preventative health care 12/09/2017  . Chronic ethmoidal sinusitis 09/04/2017  . Hypothyroidism 09/30/2016  . Presbycusis of both ears 08/05/2016  . 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 Cramps, extremity 07/03/2016  . 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 Flank pain 07/03/2016  . Stable angina ([place]) 06/17/2016  . Hx of CABG 12/27/2015  . Hyperglycemia 10/23/2015  . Unstable angina ([place]) 09/25/2015  . Upper airway cough syndrome 03/23/2015  . Hypokalemia 07/24/2014  . Cystitis 04/11/2014  . Baker's cyst of knee 01/30/2014  . Thoracic back pain 01/24/2014  . Breast cancer screening 07/31/2013  . Dyspnea 05/09/2013  . Anosmia   . Fatigue 09/17/2011  . CAD (coronary artery disease) 06/06/2011  . NSTEMI (non-ST elevated myocardial infarction) ([place]) 04/22/2011  . Excessive somnolence disorder 12/03/2010  . BACK PAIN, LUMBAR 07/16/2010  . SINUSITIS, CHRONIC 05/07/2010  . Cough 09/10/2009  . SKIN CANCER, HX OF 04/03/2009  . Coronary artery disease involving coronary bypass graft of native heart with angina pectoris ([place]) 01/24/2009  . Carotid artery stenosis 01/24/2009  . Atherosclerosis of renal artery ([place]) 01/24/2009    . 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  . AORTIC ANEUR [place] WITHOUT MENTION RUPTURE 01/24/2009  . POST-OP CARDIAC COMPLICATION 36/64/4034  . Hyperlipidemia 12/22/2006  . Depression, recurrent ([place]) 12/22/2006  . HTN (hypertension) 12/22/2006  . PERIPHERAL VASCULAR DISEASE 12/22/2006  . COPD GOLD C 12/22/2006  . GERD 12/22/2006  . Osteoporosis 12/22/2006    Past Surgical History:  Procedure Laterality Date  . ABDOMINAL AORTIC ANEURYSM REPAIR     pt denies this hx on 01/02/2016  . BASAL CELL CARCINOMA EXCISION    . CARDIAC CATHETERIZATION N/A 01/02/2016   Procedure: Left Heart Cath and Coronary Angiography;  Surgeon: Wellington Hampshire, MD;  Location: [facility] CV LAB;  Service: Cardiovascular;  Laterality: N/A;  . CARDIAC CATHETERIZATION  1996; 1999  . CARDIAC CATHETERIZATION N/A 06/25/2016   Procedure: Left Heart Cath and Cors/Grafts Angiography;  Surgeon: Wellington Hampshire,  MD;  Location: [facility] CV LAB;  Service: Cardiovascular;  Laterality: N/A;  . CARDIOVERSION N/A 10/19/2019   Procedure: CARDIOVERSION;  Surgeon: Jerline Pain, MD;  Location: [facility] ENDOSCOPY;  Service: Cardiovascular;  Laterality: N/A;  . CAROTID ENDARTERECTOMY Left 01/02/2011  . CATARACT EXTRACTION W/ INTRAOCULAR LENS  IMPLANT, BILATERAL    . CLOSED REDUCTION NASAL FRACTURE  11/2007  . CORONARY ANGIOPLASTY WITH STENT PLACEMENT  10/10/2011   drug eluting  to rc & saphenous  . CORONARY ARTERY BYPASS GRAFT  1999   "CABG X3"  . DILATION AND CURETTAGE OF UTERUS    . FEMORAL ARTERY STENT Bilateral   . FRACTURE SURGERY    . LEFT HEART CATHETERIZATION WITH CORONARY/GRAFT ANGIOGRAM N/A 04/22/2011   Procedure: LEFT HEART CATHETERIZATION WITH Beatrix Fetters;  Surgeon: Jolaine Artist, MD;  Location: [facility] CATH LAB;  Service: Cardiovascular;  Laterality: N/A;  . LEFT HEART CATHETERIZATION WITH CORONARY/GRAFT ANGIOGRAM N/A 10/10/2011   Procedure: LEFT HEART CATHETERIZATION WITH Beatrix Fetters;   Surgeon: Peter M Martinique, MD;  Location: [facility] CATH LAB;  Service: Cardiovascular;  Laterality: N/A;  . NASAL SINUS SURGERY     twice  . SQUAMOUS CELL CARCINOMA EXCISION    . SVT ABLATION N/A 03/19/2018   Procedure: SVT ABLATION;  Surgeon: Constance Haw, MD;  Location: [facility] CV LAB;  Service: Cardiovascular;  Laterality: N/A;  . TUBAL LIGATION       OB History    Gravida  6   Para  5   Term  3   Preterm  2   AB  1   Living  5     SAB  1   TAB      Ectopic      Multiple      Live Births  5           Family History  Problem Relation Age of Onset  . Heart attack Mother 75  . Diabetes Mother   . Colon cancer Father 53  . Lung cancer Father        smoked  . Heart disease Father   . Hypertension Father   . Angina Father   . Lung cancer Sister        smoked  . Hypothyroidism Sister   . Hypertension Brother   . Heart attack Brother   . Heart disease Brother        PTCA with Stent  . Heart attack Brother        CABG  . Heart disease Brother        CABG with 1 bypass  . Aneurysm Brother        brain  . Alcohol abuse Brother   . Hyperlipidemia Daughter   . Diabetes Maternal Grandmother   . Stroke Maternal Grandmother   . Cirrhosis Maternal Grandfather   . Stroke Paternal Grandmother   . Obesity Daughter   . Obesity Son     Social History   Tobacco Use  . Smoking status: Former Smoker    Packs/day: 0.50    Years: 40.00    Pack years: 20.00    Types: Cigarettes    Quit date: 01/19/1993    Years since quitting: 27.2  . Smokeless tobacco: Never Used  Vaping Use  . Vaping Use: Never used  Substance Use Topics  . Alcohol use: Yes    Comment: 01/02/2016 "might drink a glass of wine socially q 3-4 months"  . Drug use: No  Home Medications Prior to Admission medications   Medication Sig Start Date End Date Taking? Authorizing Provider  acetaminophen (TYLENOL) 650 MG CR tablet Take 1,300 mg by mouth 2 (two) times daily.    [provider]  albuterol (PROVENTIL) (2.5 MG/3ML) 0.083% nebulizer solution Take 3 mLs (2.5 mg total) by nebulization every 4 (four) hours as needed for wheezing or shortness of breath. Dx:J44.9 03/25/19   Flora Lipps, MD  albuterol (VENTOLIN HFA) 108 (90 Base) MCG/ACT inhaler INHALE 2 PUFFS INTO THE LUNGS EVERY 6 (SIX) HOURS AS NEEDED FOR WHEEZING. 06/13/19   Mosie Lukes, MD  amLODipine (NORVASC) 5 MG tablet TAKE 1 TABLET BY MOUTH ONCE DAILY 05/31/19   Minna Merritts, MD  azelastine (ASTELIN) 0.1 % nasal spray Place 2 sprays into both nostrils at bedtime as needed for rhinitis. 06/28/18   Mosie Lukes, MD  Biotin 5 MG TABS Take 5 mg by mouth daily.     [provider]  Calcium Carbonate-Vitamin D (CALCIUM + D PO) Take 1 tablet by mouth daily.     [provider]  Cholecalciferol (VITAMIN D3) 2000 units TABS Take 2,000 Units by mouth daily.     [provider]  clopidogrel (PLAVIX) 75 MG tablet TAKE 1 TABLET BY MOUTH ONCE A DAY 01/27/20   Minna Merritts, MD  diltiazem (CARDIZEM CD) 240 MG 24 hr capsule Take 1 capsule (240 mg total) by mouth daily. 12/19/19   Camnitz, Ocie Doyne, MD  diltiazem (CARDIZEM) 30 MG tablet Take one tablet by mouth every 4 hours AS NEEDED for A-fib HR > 100 as long as BP > 100. 11/11/18   Fenton, Clint R, PA  ezetimibe (ZETIA) 10 MG tablet TAKE 1 TABLET BY MOUTH DAILY 01/27/20   Minna Merritts, MD  Fluticasone-Umeclidin-Vilant (TRELEGY ELLIPTA) 100-62.5-25 MCG/INH AEPB Inhale 1 puff into the lungs daily. 01/02/20   Collene Gobble, MD  furosemide (LASIX) 20 MG tablet Take 1 tablet (20 mg total) by mouth as directed. Take 1 tablet (20 mg) daily and extra 1 tablet (20 mg) at 2 PM for shortness of breath or abdominal swelling 01/27/20 04/26/20  Minna Merritts, MD  HYDROcodone-acetaminophen (NORCO) 10-325 MG tablet Take 1 tablet by mouth every 6 (six) hours as needed for severe pain (pain.).     [provider]  isosorbide mononitrate  (IMDUR) 60 MG 24 hr tablet TAKE 1 TABLET BY MOUTH TWICE A DAY 05/31/19   Gollan, Kathlene November, MD  Krill Oil 500 MG CAPS Take 500 mg by mouth daily.     [provider]  levothyroxine (SYNTHROID) 50 MCG tablet TAKE 1 TABLET BY MOUTH ONCE DAILY 07/25/19   Mosie Lukes, MD  nitroGLYCERIN (NITROSTAT) 0.4 MG SL tablet Place 1 tablet (0.4 mg total) under the tongue every 5 (five) minutes as needed for chest pain. 08/23/19   Shelda Pal, DO  omeprazole (PRILOSEC) 20 MG capsule Take 1 capsule (20 mg total) by mouth daily. 03/22/20   Mosie Lukes, MD  Polyethyl Glycol-Propyl Glycol (SYSTANE OP) Place 1 drop into both eyes 2 (two) times daily as needed (dry/irritated eyes.).     [provider]  rivaroxaban (XARELTO) 20 MG TABS tablet Take 1 tablet (20 mg total) by mouth daily with supper. 09/23/19   Camnitz, Will Hassell Done, MD  rosuvastatin (CRESTOR) 40 MG tablet TAKE 1 TABLET BY MOUTH ONCE DAILY 11/16/19   Minna Merritts, MD  sertraline (ZOLOFT) 100 MG tablet Take  1 tablet (100 mg total) by mouth daily. 06/13/19   Mosie Lukes, MD  Vitamin D, Ergocalciferol, (DRISDOL) 1.25 MG (50000 UNIT) CAPS capsule TAKE 1 CAPSULE (50,000 UNITS) BY MOUTH ONCE A WEEK. 01/18/20   Shelda Pal, DO  zinc gluconate 50 MG tablet Take 50 mg by mouth daily.    [provider]    Allergies    Erythromycin, Meperidine, Shellfish allergy, Ciprofloxacin, Codeine, Penicillins, and Tape  Review of Systems   Review of Systems 10 systems reviewed and negative except as per HPI Physical Exam Updated Vital Signs BP (!) 123/97   Pulse 97   Temp 98 F (36.7 C) (Oral)   Resp (!) 30   Ht 5\' 2"  (1.575 m)   Wt 70 kg   SpO2 97%   BMI 28.23 kg/m   Physical Exam Constitutional:      Comments: Alert and nontoxic.  Mental status is clear.  Mild tachypnea.  Patient does appear uncomfortable and is repositioning in the stretcher fairly frequently.  Color is good.  HENT:     Head:  Normocephalic and atraumatic.     Mouth/Throat:     Pharynx: Oropharynx is clear.  Eyes:     Comments: Eczematous rash around the right eye.  No significant swelling.  Cardiovascular:     Comments: Irregularly irregular.  Rate control.  Monitor shows A. fib rate 80s to 90s. Pulmonary:     Comments: Mild tachypnea.  Patient does not exhibit significant respiratory distress at rest.  Speech is clear with full sentences.  Airflow to the bases is good.  She does have fine expiratory wheeze. Abdominal:     Comments: Abdomen is soft.  Mild to moderate epigastric tenderness to palpation.  Lower abdomen nontender.  No guarding.  Musculoskeletal:        General: No swelling or tenderness. Normal range of motion.     Right lower leg: No edema.     Left lower leg: No edema.  Skin:    General: Skin is warm and dry.  Neurological:     General: No focal deficit present.     Mental Status: She is oriented to person, place, and time.     Coordination: Coordination normal.  Psychiatric:        Mood and Affect: Mood normal.     ED Results / Procedures / Treatments   Labs (all labs ordered are listed, but only abnormal results are displayed) Labs Reviewed  BASIC METABOLIC PANEL - Abnormal; Notable for the following components:      Result Value   Glucose, Bld 121 (*)    GFR, Estimated 58 (*)    All other components within normal limits  CBC - Abnormal; Notable for the following components:   Hemoglobin 10.7 (*)    HCT 35.6 (*)    MCH 24.8 (*)    RDW 17.1 (*)    Platelets 146 (*)    All other components within normal limits  PROTIME-INR - Abnormal; Notable for the following components:   Prothrombin Time 28.2 (*)    INR 2.8 (*)    All other components within normal limits  HEPATIC FUNCTION PANEL - Abnormal; Notable for the following components:   Total Protein 6.2 (*)    Albumin 3.3 (*)    All other components within normal limits  RESPIRATORY PANEL BY RT PCR (FLU A&B, COVID)  LIPASE,  BLOOD  TROPONIN I (HIGH SENSITIVITY)  TROPONIN I (HIGH SENSITIVITY)    EKG  EKG Interpretation  Date/Time:  Tuesday April 10 2020 05:12:56 EST Ventricular Rate:  73 PR Interval:    QRS Duration: 94 QT Interval:  410 QTC Calculation: 451 R Axis:   104 Text Interpretation: Undetermined rhythm Rightward axis T wave abnormality, consider inferior ischemia Abnormal ECG afib, no change from previous Confirmed by Charlesetta Shanks [phone]) on 04/10/2020 7:03:36 AM   Radiology DG Chest 2 View  Result Date: 04/10/2020 CLINICAL DATA:  Chest pain EXAM: CHEST - 2 VIEW COMPARISON:  08/15/2019 FINDINGS: Mild left basilar atelectasis. Lungs are otherwise clear. No pneumothorax or pleural effusion. Coronary artery bypass grafting has been performed. Mild cardiomegaly is stable. No acute bone abnormality. IMPRESSION: No active cardiopulmonary disease. Electronically Signed   By: Fidela Salisbury MD   On: 04/10/2020 06:04   CT Angio Chest/Abd/Pel for Dissection W and/or W/WO  Result Date: 04/10/2020 CLINICAL DATA:  Sudden onset diffuse abdominal pain lasting for the past several days. The pain extended bilaterally from the tops of the shoulders of the waist starting at 3:20 a.m. today. Clinical concern for aortic dissection. EXAM: CT ANGIOGRAPHY CHEST, ABDOMEN AND PELVIS TECHNIQUE: Non-contrast CT of the chest was initially obtained. Multidetector CT imaging through the chest, abdomen and pelvis was performed using the standard protocol during bolus administration of intravenous contrast. Multiplanar reconstructed images and MIPs were obtained and reviewed to evaluate the vascular anatomy. CONTRAST:  100 cc Omnipaque 350 COMPARISON:  Abdomen and pelvis CTA dated 08/23/2013. Chest CT dated 12/18/2009 FINDINGS: CTA CHEST FINDINGS Cardiovascular: Enlarged heart. Post CABG changes. Atheromatous arterial calcifications, including the thoracic aorta. No aneurysm or dissection seen. Mediastinum/Nodes: No enlarged  lymph nodes. Small thyroid gland. Small hiatal hernia. Lungs/Pleura: Small right pleural effusion. Bilateral centrilobular bullous changes. Interval posterior left upper lobe nodule measuring 1.3 cm in maximum diameter on image number 36 series 8 and 1.0 x 1.0 cm on image number 34 series 8. Interval irregular nodular density more superiorly in the left upper lobe measuring 2.7 x 1.4 cm on image number 16 series 8. Mild biapical pleural and parenchymal scarring. Musculoskeletal: Mild scoliosis. Mild thoracic spine degenerative changes. Bilateral benign appearing breast calcifications. Review of the MIP images confirms the above findings. CTA ABDOMEN AND PELVIS FINDINGS VASCULAR Aorta: Atheromatous calcifications without significant luminal narrowing. Aneurysmal dilatation of the infrarenal abdominal aorta measuring 3.8 cm on image number 39 series 9. This measured 2.8 cm on 08/23/2013. Cleft-like plaque formation more proximally in the abdominal aorta with an appearance similar to that seen more distally previously. No propagating dissection seen. Celiac: Plaque formation at the origin producing approximately 50% luminal stenosis. SMA: Densely calcified plaque at the origin producing approximately 90% stenosis. Renals: Dense plaque at the margins of both renal arteries producing severe stenosis. This is estimated at greater than 90% bilaterally. IMA: Proximal occlusion again demonstrated. Inflow: Bilateral common iliac artery stents are again demonstrated. These remain patent. Bilateral internal and external iliac artery calcified plaques. Extensive bilateral femoral artery calcified plaques. Veins: No obvious venous abnormality within the limitations of this arterial phase study. Review of the MIP images confirms the above findings. NON-VASCULAR Hepatobiliary: No focal liver abnormality is seen. No gallstones, gallbladder wall thickening, or biliary dilatation. Pancreas: Unremarkable. No pancreatic ductal  dilatation or surrounding inflammatory changes. Spleen: Normal in size without focal abnormality. Adrenals/Urinary Tract: Adrenal glands are unremarkable. Kidneys are normal, without renal calculi, focal lesion, or hydronephrosis. Bladder is unremarkable. Stomach/Bowel: Small hiatal hernia. Multiple sigmoid colon diverticula. Normal appendix. Unremarkable small bowel. Lymphatic: No enlarged  lymph nodes. Reproductive: Uterus and bilateral adnexa are unremarkable. Other: No abdominal wall hernia or abnormality. No abdominopelvic ascites. Musculoskeletal: Mild lumbar spine degenerative changes. Mild scoliosis. Review of the MIP images confirms the above findings. IMPRESSION: 1. No aortic dissection or other acute abnormality. 2. Interval 1.3 cm and 1.0 cm left upper lobe nodules. These are concerning for malignancy. Further evaluation with a PET-CT is recommended. 3. Small right pleural effusion. 4. COPD with centrilobular emphysema. 5. 3.8 cm infrarenal abdominal aortic aneurysm without significant change. 6. Proximal occlusion of the inferior mesenteric artery again noted. 7. Extensive atheromatous calcifications including aortic atherosclerosis. 8. Calcified plaques producing an estimated 50% stenosis of the proximal celiac artery, 90% stenosis of the proximal SMA and at least 90% stenosis of both proximal renal arteries. 9. Small hiatal hernia. 10. Sigmoid diverticulosis. Aortic Atherosclerosis (ICD10-I70.0) and Emphysema (ICD10-J43.9). Electronically Signed   By: Claudie Revering M.D.   On: 04/10/2020 09:26    Procedures Procedures (including critical care time) CRITICAL CARE Performed by: Charlesetta Shanks   Total critical care time: 45 minutes  Critical care time was exclusive of separately billable procedures and treating other patients.  Critical care was necessary to treat or prevent imminent or life-threatening deterioration.  Critical care was time spent personally by me on the following activities:  development of treatment plan with patient and/or surrogate as well as nursing, discussions with consultants, evaluation of patient's response to treatment, examination of patient, obtaining history from patient or surrogate, ordering and performing treatments and interventions, ordering and review of laboratory studies, ordering and review of radiographic studies, pulse oximetry and re-evaluation of patient's condition. Medications Ordered in ED Medications  isosorbide mononitrate (IMDUR) 24 hr tablet 60 mg (60 mg Oral Given 04/10/20 1051)  pantoprazole (PROTONIX) injection 40 mg (40 mg Intravenous Given 04/10/20 0919)  albuterol (VENTOLIN HFA) 108 (90 Base) MCG/ACT inhaler 2 puff (2 puffs Inhalation Given 04/10/20 0924)  morphine 2 MG/ML injection 2 mg (2 mg Intravenous Given 04/10/20 0922)  iohexol (OMNIPAQUE) 350 MG/ML injection 100 mL (100 mLs Intravenous Contrast Given 04/10/20 0905)  ipratropium-albuterol (DUONEB) 0.5-2.5 (3) MG/3ML nebulizer solution 3 mL (3 mLs Nebulization Given 04/10/20 1051)  methylPREDNISolone sodium succinate (SOLU-MEDROL) 125 mg/2 mL injection 125 mg (125 mg Intravenous Given 04/10/20 1051)  diltiazem (CARDIZEM CD) 24 hr capsule 240 mg (240 mg Oral Given 04/10/20 1203)  clopidogrel (PLAVIX) tablet 75 mg (75 mg Oral Given 04/10/20 1051)    ED Course  I have reviewed the triage vital signs and the nursing notes.  Pertinent labs & imaging results that were available during my care of the patient were reviewed by me and considered in my medical decision making (see chart for details).    MDM Rules/Calculators/A&P                         Consult: Cardiology for chest pain and atrial fibrillation.  Patient reportedly is anticipating inpatient management of atrial fibrillation with transition to Tikosyn.  Consult: Reviewed with Dr. Donnetta Hutching of vascular surgery. CT scan is consistent with prior findings of severe diffuse mesenteric atherosclerosis. Dr. Donnetta Hutching, would  continue current management and would not intervene with mesenteric stenosis unless patient is exhibiting typical symptoms of postprandial pain and mesenteric ischemia. Can reconsult if needed.  Patient reports she has been feeling increasingly short of breath for about a week.  Today she got a severe lower chest pain that was bilateral.  Pain seemed to wrap around into  her back and epigastrium.  This pain was of acute onset.  Patient had known history of severe atherosclerosis and prior aortic aneurysm.  CT angiogram obtained to rule out dissection or aneurysm.  No significant enlargement in patient's underlying aortic aneurysm this is stable.  However, patient has extensive atherosclerotic disease with prior stents in the iliacs and known obstructions of mesenteric arteries.  At this time, I have lower suspicion for mesenteric ischemia regarding this acute presentation although this is a significant ongoing risk with degree of underlying disease.  Patient advises that she was being evaluated for transition to [facility] on inpatient basis for her atrial fibrillation.  She reports that since developing atrial fibrillation and taking medications for rate control, she is gotten persistently more short of breath and fatigue.  Patient got significant improvement of discomfort after administration of Protonix, morphine and albuterol inhaler.  She continues to feel short of breath, she has significant COPD and emphysema, I suspect a portion of this is related to her underlying lung disease, will add Solu-Medrol and DuoNeb's.  Cardiology consulted to evaluate patient for atrial fibrillation with chest pain, possibly anginal in nature and decision making regarding inpatient management for conversion to Tikosyn due to increasing symptomatic atrial fibrillation.  Electophysiology physician Dr. Baird Kay communicated via cardiology specialty nurse, Wannetta Sender.  She advises that with the patient being rate controlled and not acutely  and congestive heart failure, no indication for admission at this time for cardiology purposes.  I have treated the patient for COPD exacerbation.  She has felt improved throughout the day.  She still has some shortness of breath but is at baseline oxygen requirement.  We discussed the pros and cons of admission to the hospital for COPD exacerbation shortness of breath.  This time, patient prefers to take medications at home, continue nebulized albuterol therapy with nebulizer and follow-up with cardiology tomorrow as scheduled.  Strict return precautions given for any worsening shortness of breath or chest pain.  Final Clinical Impression(s) / ED Diagnoses Final diagnoses:  Chronic atrial fibrillation (HCC)  Nonspecific chest pain  Atherosclerosis  Shortness of breath    Rx / DC Orders ED Discharge Orders    None       Charlesetta Shanks, MD 04/10/20 1216    Charlesetta Shanks, MD 04/10/20 5830    Charlesetta Shanks, MD 04/10/20 1236    Charlesetta Shanks, MD 04/10/20 1537

## 2020-04-10 NOTE — ED Triage Notes (Signed)
Patient arrived with EMS from home reports pain across her chest/ribcage radiating to both shoulders this morning , mild SOB , no emesis or diaphoresis , she received ASA 324 mg and 5 NTG sl prior to arrival with relief . History of CAD/CABG .

## 2020-04-10 NOTE — Discharge Instructions (Signed)
1.  Start taking prednisone at dinnertime as prescribed.  You were given an IV dose of steroid in the emergency department.  Take per package instructions. 2.  Use your nebulizer machine to do a treatment of albuterol and Atrovent every 4-6 hours for the next 48 hours.  After that, you can resume your normal schedule if your symptoms are improved. 3.  See the electrophysiology physician tomorrow as planned. 4.  Return to the emergency department if your breathing is getting worse, you are having any recurrence of chest pain or other concerning symptoms.

## 2020-04-10 NOTE — ED Notes (Signed)
Reviewed discharge instructions with patient. Follow-up care and medications reviewed. Patient verbalized understanding. Patient A&Ox4, VSS upon discharge.

## 2020-04-11 ENCOUNTER — Other Ambulatory Visit: Payer: Self-pay | Admitting: Cardiology

## 2020-04-11 ENCOUNTER — Telehealth: Payer: Self-pay | Admitting: Pharmacist

## 2020-04-11 ENCOUNTER — Ambulatory Visit (HOSPITAL_COMMUNITY)
Admission: RE | Admit: 2020-04-11 | Discharge: 2020-04-11 | Disposition: A | Discharge status: Home / Self Care | Payer: Medicare HMO | Source: Ambulatory Visit | Attending: Physician Assistant | Admitting: Physician Assistant

## 2020-04-11 ENCOUNTER — Encounter (HOSPITAL_COMMUNITY): Payer: Self-pay | Admitting: Physician Assistant

## 2020-04-11 ENCOUNTER — Other Ambulatory Visit (HOSPITAL_COMMUNITY): Payer: Self-pay | Admitting: Physician Assistant

## 2020-04-11 ENCOUNTER — Encounter (HOSPITAL_COMMUNITY): Payer: Self-pay

## 2020-04-11 ENCOUNTER — Other Ambulatory Visit: Payer: Self-pay | Admitting: Cardiovascular Disease

## 2020-04-11 VITALS — BP 110/70 | HR 86 | Ht 62.0 in | Wt 144.8 lb

## 2020-04-11 DIAGNOSIS — Z7901 Long term (current) use of anticoagulants: Secondary | ICD-10-CM | POA: Insufficient documentation

## 2020-04-11 DIAGNOSIS — Z87891 Personal history of nicotine dependence: Secondary | ICD-10-CM | POA: Diagnosis not present

## 2020-04-11 DIAGNOSIS — I2510 Atherosclerotic heart disease of native coronary artery without angina pectoris: Secondary | ICD-10-CM | POA: Insufficient documentation

## 2020-04-11 DIAGNOSIS — E785 Hyperlipidemia, unspecified: Secondary | ICD-10-CM | POA: Diagnosis not present

## 2020-04-11 DIAGNOSIS — G4733 Obstructive sleep apnea (adult) (pediatric): Secondary | ICD-10-CM | POA: Insufficient documentation

## 2020-04-11 DIAGNOSIS — Z95828 Presence of other vascular implants and grafts: Secondary | ICD-10-CM | POA: Insufficient documentation

## 2020-04-11 DIAGNOSIS — Z79890 Hormone replacement therapy (postmenopausal): Secondary | ICD-10-CM | POA: Insufficient documentation

## 2020-04-11 DIAGNOSIS — I739 Peripheral vascular disease, unspecified: Secondary | ICD-10-CM | POA: Insufficient documentation

## 2020-04-11 DIAGNOSIS — Z951 Presence of aortocoronary bypass graft: Secondary | ICD-10-CM | POA: Insufficient documentation

## 2020-04-11 DIAGNOSIS — I38 Endocarditis, valve unspecified: Secondary | ICD-10-CM | POA: Diagnosis not present

## 2020-04-11 DIAGNOSIS — Z79899 Other long term (current) drug therapy: Secondary | ICD-10-CM | POA: Insufficient documentation

## 2020-04-11 DIAGNOSIS — D6869 Other thrombophilia: Secondary | ICD-10-CM

## 2020-04-11 DIAGNOSIS — Z9989 Dependence on other enabling machines and devices: Secondary | ICD-10-CM | POA: Diagnosis not present

## 2020-04-11 DIAGNOSIS — Z7951 Long term (current) use of inhaled steroids: Secondary | ICD-10-CM | POA: Diagnosis not present

## 2020-04-11 DIAGNOSIS — I4819 Other persistent atrial fibrillation: Secondary | ICD-10-CM | POA: Insufficient documentation

## 2020-04-11 DIAGNOSIS — Z7902 Long term (current) use of antithrombotics/antiplatelets: Secondary | ICD-10-CM | POA: Diagnosis not present

## 2020-04-11 DIAGNOSIS — I272 Pulmonary hypertension, unspecified: Secondary | ICD-10-CM | POA: Insufficient documentation

## 2020-04-11 DIAGNOSIS — I471 Supraventricular tachycardia: Secondary | ICD-10-CM | POA: Diagnosis not present

## 2020-04-11 DIAGNOSIS — J449 Chronic obstructive pulmonary disease, unspecified: Secondary | ICD-10-CM | POA: Diagnosis not present

## 2020-04-11 DIAGNOSIS — R233 Spontaneous ecchymoses: Secondary | ICD-10-CM | POA: Diagnosis not present

## 2020-04-11 DIAGNOSIS — I10 Essential (primary) hypertension: Secondary | ICD-10-CM | POA: Diagnosis not present

## 2020-04-11 DIAGNOSIS — J45909 Unspecified asthma, uncomplicated: Secondary | ICD-10-CM | POA: Diagnosis not present

## 2020-04-11 DIAGNOSIS — E039 Hypothyroidism, unspecified: Secondary | ICD-10-CM | POA: Insufficient documentation

## 2020-04-11 MED ORDER — POTASSIUM CHLORIDE ER 10 MEQ PO TBCR: 10.0000 meq | EXTENDED_RELEASE_TABLET | Freq: Every day | ORAL | 3 refills | Status: DC

## 2020-04-11 NOTE — Telephone Encounter (Signed)
Will deny Xarelto refill request as afib note today mentions changing to Eliquis

## 2020-04-11 NOTE — Patient Instructions (Signed)
Start potassium 42meq once a day

## 2020-04-11 NOTE — Telephone Encounter (Signed)
Medication list reviewed in anticipation of upcoming Tikosyn initiation. Patient is taking albuterol and Trelegy inhalers which are QTc prolonging, however not contraindicated with Tikosyn as systemic absorption is lower. Pt also takes 2 calcium channel blockers - amlodipine and diltiazem. Both are weak CYP 3A4 inhibitors and can increase concentration of Tikosyn. Also not contraindicated but typically recommend that patients not take 2 calcium channel blockers, would consider discontinuing one. She also takes sertraline which is QTc prolonging, however less so than other SSRIs. Ok to continue but will need to keep a close eye on QTc.  Patient is anticoagulated on Xarelto 20mg  daily. Her CrCl has decreased to 46.88mL/min using most recent SCr from yesterday of 0.98. Last month, her CrCl was 62mL/min using SCr of 0.8. She technically qualifies for Xarelto 15mg  daily based on most recent SCr, however her dosing will likely continue to fluctuate even with normal renal function due to age and lower weight. Could consider Eliquis as an option (she would more definitively qualify for 5mg  BID based on stable weight and SCr, also preferred due to lower bleeding risk in elderly patients). Please ensure that patient has not missed any anticoagulation doses in the 3 weeks prior to Tikosyn initiation.   Patient will need to be counseled to avoid use of Benadryl while on Tikosyn and in the 2-3 days prior to Tikosyn initiation.

## 2020-04-11 NOTE — Progress Notes (Addendum)
Primary Care Physician: Mosie Lukes, MD Primary Cardiologist: Dr Rockey Situ Primary Electrophysiologist: Dr Curt Bears Referring Physician: [facility]   Andrea [name] is a 79 y.o. female with a history ofcoronary disease status post CABG in 1999 status post PCI to the distal RCA in 2013, PCI x2 to the SVG to the RCA into the mid circumflex in 2017, PVD status post lower extremity stenting, carotid artery disease status post left-sided CEA, pulmonary hypertension, valvular heart disease, COPD, infrarenal AAA, PE, hypertension, hyperlipidemia, asthma, hypothyroidism, SVT/atrial tach, and persistent atrial fibrillation who presents for follow up in the [facility].  The patient was initially diagnosed with atrial fibrillation on 11/07/18 after presenting to the ER with symptoms of palpitations, dyspnea, and chest discomfort. At the ER, she was found to be in rate controlled afib. She was started on Xarelto. Patient was hospitalized for a COPD exacerbation 07/2019 and was found to be in afib at that time. Echo 08/16/19 showed preserved EF with mod dilated LA. She saw Dr Curt Bears on 09/22/19 and DCCV was recommended. Patient is s/p DCCV 10/19/19. After the procedure, she reports that she did not feel improved in SR. She saw Dr Curt Bears 02/16/20 and complained of increased fatigue, weakness, and edema. These were felt to be 2/2 her afib despite rate control.   On follow up today, patient reports she is not feeling well. She was seen at the ED 04/10/20 for CP and was treated for suspected COPD exacerbation. She remains in rate controlled afib with symptoms of SOB. She denies any bleeding issues with anticoagulation.   Today, she denies symptoms of palpitations, chest pain, orthopnea, PND, dizziness, presyncope, syncope, bleeding, or neurologic sequela. The patient is tolerating medications without difficulties and is otherwise without complaint today.    Atrial Fibrillation Risk  Factors:  she does have symptoms or diagnosis of sleep apnea. she does not have a history of rheumatic fever. she does not have a history of alcohol use. The patient does not have a history of early familial atrial fibrillation or other arrhythmias.  she has a BMI of Body mass index is 26.48 kg/m.Marland Kitchen Filed Weights   04/11/20 0933  Weight: 65.7 kg    Family History  Problem Relation Age of Onset  . Heart attack Mother 15  . Diabetes Mother   . Colon cancer Father 1  . Lung cancer Father        smoked  . Heart disease Father   . Hypertension Father   . Angina Father   . Lung cancer Sister        smoked  . Hypothyroidism Sister   . Hypertension Brother   . Heart attack Brother   . Heart disease Brother        PTCA with Stent  . Heart attack Brother        CABG  . Heart disease Brother        CABG with 1 bypass  . Aneurysm Brother        brain  . Alcohol abuse Brother   . Hyperlipidemia Daughter   . Diabetes Maternal Grandmother   . Stroke Maternal Grandmother   . Cirrhosis Maternal Grandfather   . Stroke Paternal Grandmother   . Obesity Daughter   . Obesity Son      Atrial Fibrillation Management history:  Previous antiarrhythmic drugs: Multaq (stopped 2/2 side effects) Previous cardioversions: 10/19/19 Previous ablations: EP study 03/19/18 (tachy not inducible. SVT) CHADS2VASC score: 5 Anticoagulation history:  Xarelto    Past Medical History:  Diagnosis Date  . AAA (abdominal aortic aneurysm) ([place]) 01/2009   AAA (2.8 x 3.0)  moderate RAS (left); 2.7 x 2.7 cm (07/11/05)  . Acute bronchitis 03/20/2013; 2017  . Angina   . Anosmia   . Asthma   . Baker's cyst of knee 01/30/2014  . Basal cell carcinoma    "back and left arm"  . Bradycardia    Metoprolol stopped 08/2011  . CAD (coronary artery disease)    s/CABG (reports IMA and 2 SVGs) back in 1999  Myoview normal 3/10; s/p PCI with DES to PL branch of distal RCA 09/2011; PCI +DES to SVG-RCA, PCI + DES to mid  LCx 12/2015  . Cerebrovascular disease 01/2009   carotid u/s  R 0-39%   L 60-79%  . Chronic thoracic back pain   . COPD (chronic obstructive pulmonary disease) ([place])   . Depression   . Dizziness   . Dysrhythmia    hx of sinus brady  . GERD (gastroesophageal reflux disease)   . Heart murmur   . Herniated lumbar disc without myelopathy   . History of blood transfusion 1999   "when I had the bypass; had a PE"  . Hyperglycemia 10/23/2015  . Hyperlipidemia   . Hypertension   . Hypothyroidism 09/30/2016  . Medicare annual wellness visit, subsequent 07/31/2013  . NSTEMI (non-ST elevated myocardial infarction) ([place]) 11/12   Cath showed atretic IMA graft to the LAD, SVG to PD was patent but the continuation of this graft to the PL branch was occluded; there were L to R collaterals; Mid LAD had a 60 to 70% stenosis. She has been treated medically.  Neg Myoview 05/2011  . Osteoarthritis of back   . Osteoporosis   . Pneumonia "several times"  . Pulmonary embolism ([place]) 1999   "after my bypass"  . PVD (peripheral vascular disease) ([place])   . Shortness of breath   . Squamous carcinoma    "nose"  . Thyroid disease    Hypothyroid  . Unstable angina ([place]) 09/25/2015   Past Surgical History:  Procedure Laterality Date  . ABDOMINAL AORTIC ANEURYSM REPAIR     pt denies this hx on 01/02/2016  . BASAL CELL CARCINOMA EXCISION    . CARDIAC CATHETERIZATION N/A 01/02/2016   Procedure: Left Heart Cath and Coronary Angiography;  Surgeon: Wellington Hampshire, MD;  Location: [facility];  Service: Cardiovascular;  Laterality: N/A;  . CARDIAC CATHETERIZATION  1996; 1999  . CARDIAC CATHETERIZATION N/A 06/25/2016   Procedure: Left Heart Cath and Cors/Grafts Angiography;  Surgeon: Wellington Hampshire, MD;  Location: [facility];  Service: Cardiovascular;  Laterality: N/A;  . CARDIOVERSION N/A 10/19/2019   Procedure: CARDIOVERSION;  Surgeon: Jerline Pain, MD;  Location: [facility] ENDOSCOPY;  Service: Cardiovascular;   Laterality: N/A;  . CAROTID ENDARTERECTOMY Left 01/02/2011  . CATARACT EXTRACTION W/ INTRAOCULAR LENS  IMPLANT, BILATERAL    . CLOSED REDUCTION NASAL FRACTURE  11/2007  . CORONARY ANGIOPLASTY WITH STENT PLACEMENT  10/10/2011   drug eluting  to rc & saphenous  . CORONARY ARTERY BYPASS GRAFT  1999   "CABG X3"  . DILATION AND CURETTAGE OF UTERUS    . FEMORAL ARTERY STENT Bilateral   . FRACTURE SURGERY    . LEFT HEART CATHETERIZATION WITH CORONARY/GRAFT ANGIOGRAM N/A 04/22/2011   Procedure: LEFT HEART CATHETERIZATION WITH Beatrix Fetters;  Surgeon: Jolaine Artist, MD;  Location: [facility] CATH LAB;  Service: Cardiovascular;  Laterality: N/A;  . LEFT HEART CATHETERIZATION WITH CORONARY/GRAFT ANGIOGRAM N/A 10/10/2011   Procedure: LEFT HEART CATHETERIZATION WITH Beatrix Fetters;  Surgeon: Peter M Martinique, MD;  Location: [facility] CATH LAB;  Service: Cardiovascular;  Laterality: N/A;  . NASAL SINUS SURGERY     twice  . SQUAMOUS CELL CARCINOMA EXCISION    . SVT ABLATION N/A 03/19/2018   Procedure: SVT ABLATION;  Surgeon: Constance Haw, MD;  Location: [facility];  Service: Cardiovascular;  Laterality: N/A;  . TUBAL LIGATION      Current Outpatient Medications  Medication Sig Dispense Refill  . acetaminophen (TYLENOL) 650 MG CR tablet Take 1,300 mg by mouth 2 (two) times daily.    Marland Kitchen albuterol (PROVENTIL) (2.5 MG/3ML) 0.083% nebulizer solution Take 3 mLs (2.5 mg total) by nebulization every 4 (four) hours as needed for wheezing or shortness of breath. Dx:J44.9 75 mL 12  . albuterol (PROVENTIL) (5 MG/ML) 0.5% nebulizer solution Take 0.5 mLs (2.5 mg total) by nebulization every 6 (six) hours as needed for wheezing or shortness of breath. 20 mL 2  . albuterol (VENTOLIN HFA) 108 (90 Base) MCG/ACT inhaler INHALE 2 PUFFS INTO THE LUNGS EVERY 6 (SIX) HOURS AS NEEDED FOR WHEEZING. 18 g 5  . amLODipine (NORVASC) 5 MG tablet TAKE 1 TABLET BY MOUTH ONCE DAILY 90 tablet 3  . azelastine  (ASTELIN) 0.1 % nasal spray Place 2 sprays into both nostrils at bedtime as needed for rhinitis. 30 mL 3  . Biotin 5 MG TABS Take 5 mg by mouth daily.     . Calcium Carbonate-Vitamin D (CALCIUM + D PO) Take 1 tablet by mouth daily.     . Cholecalciferol (VITAMIN D3) 2000 units TABS Take 2,000 Units by mouth daily.     . clopidogrel (PLAVIX) 75 MG tablet TAKE 1 TABLET BY MOUTH ONCE A DAY 90 tablet 0  . diltiazem (CARDIZEM CD) 240 MG 24 hr capsule Take 1 capsule (240 mg total) by mouth daily. 90 capsule 2  . diltiazem (CARDIZEM) 30 MG tablet Take one tablet by mouth every 4 hours AS NEEDED for A-fib HR > 100 as long as BP > 100. 45 tablet 0  . ezetimibe (ZETIA) 10 MG tablet TAKE 1 TABLET BY MOUTH DAILY 90 tablet 0  . Fluticasone-Umeclidin-Vilant (TRELEGY ELLIPTA) 100-62.5-25 MCG/INH AEPB Inhale 1 puff into the lungs daily. 60 each 5  . furosemide (LASIX) 20 MG tablet Take 1 tablet (20 mg total) by mouth as directed. Take 1 tablet (20 mg) daily and extra 1 tablet (20 mg) at 2 PM for shortness of breath or abdominal swelling 180 tablet 2  . HYDROcodone-acetaminophen (NORCO) 10-325 MG tablet Take 1 tablet by mouth every 6 (six) hours as needed for severe pain (pain.).     Marland Kitchen ipratropium (ATROVENT) 0.02 % nebulizer solution Take 2.5 mLs (0.5 mg total) by nebulization 4 (four) times daily. 75 mL 2  . isosorbide mononitrate (IMDUR) 60 MG 24 hr tablet TAKE 1 TABLET BY MOUTH TWICE A DAY 180 tablet 3  . Krill Oil 500 MG CAPS Take 500 mg by mouth daily.     Marland Kitchen levothyroxine (SYNTHROID) 50 MCG tablet TAKE 1 TABLET BY MOUTH ONCE DAILY 90 tablet 3  . nitroGLYCERIN (NITROSTAT) 0.4 MG SL tablet Place 1 tablet (0.4 mg total) under the tongue every 5 (five) minutes as needed for chest pain. 25 tablet 0  . omeprazole (PRILOSEC) 20 MG capsule Take 1 capsule (20 mg total) by mouth daily. 90 capsule  3  . Polyethyl Glycol-Propyl Glycol (SYSTANE OP) Place 1 drop into both eyes 2 (two) times daily as needed (dry/irritated  eyes.).     Marland Kitchen predniSONE (DELTASONE) 20 MG tablet 3 tabs po day one, then 2 po daily x 4 days 11 tablet 0  . rivaroxaban (XARELTO) 20 MG TABS tablet Take 1 tablet (20 mg total) by mouth daily with supper. 30 tablet 6  . rosuvastatin (CRESTOR) 40 MG tablet TAKE 1 TABLET BY MOUTH ONCE DAILY 90 tablet 2  . sertraline (ZOLOFT) 100 MG tablet Take 1 tablet (100 mg total) by mouth daily. 90 tablet 3  . Vitamin D, Ergocalciferol, (DRISDOL) 1.25 MG (50000 UNIT) CAPS capsule TAKE 1 CAPSULE (50,000 UNITS) BY MOUTH ONCE A WEEK. 12 capsule 1  . zinc gluconate 50 MG tablet Take 50 mg by mouth daily.    . potassium chloride (KLOR-CON) 10 MEQ tablet Take 1 tablet (10 mEq total) by mouth daily. 30 tablet 3   No current facility-administered medications for this encounter.    Allergies  Allergen Reactions  . Erythromycin Other (See Comments)    Tongue burns  . Meperidine Nausea And Vomiting  . Shellfish Allergy Nausea And Vomiting  . Ciprofloxacin Rash and Other (See Comments)    Rash from IV  . Codeine Nausea And Vomiting  . Penicillins Rash and Other (See Comments)    Has patient had a PCN reaction causing immediate rash, facial/tongue/throat swelling, SOB or lightheadedness with hypotension:unsure Has patient had a PCN reaction causing severe rash involving mucus membranes or skin necrosis:No Has patient had a PCN reaction that required hospitalization:No Has patient had a PCN reaction occurring within the last 10 years:No If all of the above answers are "NO", then may proceed with Cephalosporin use.   . Tape Rash    pls use paper tape    Social History   Socioeconomic History  . Marital status: Married    Spouse name: Not on file  . Number of children: 5  . Years of education: Not on file  . Highest education level: Not on file  Occupational History  . 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 Smoking status: Former Smoker    Packs/day: 0.50     Years: 40.00    Pack years: 20.00    Types: Cigarettes    Quit date: 01/19/1993    Years since quitting: 27.2  . Smokeless tobacco: Never Used  Vaping Use  . Vaping Use: Never used  Substance and Sexual Activity  . Alcohol use: Yes    Comment: 01/02/2016 "might drink a glass of wine socially q 3-4 months"  . Drug use: No  . Sexual activity: Not Currently    Birth control/protection: Post-menopausal  Other Topics Concern  . Not on file  Social History Narrative   Married with 5 children   Social Determinants of Health   Financial Resource Strain:   . Difficulty of Paying Living Expenses: Not on file  Food Insecurity:   . Worried About Charity fundraiser in the Last Year: Not on file  . Ran Out of Food in the Last Year: Not on file  Transportation Needs:   . Lack of Transportation (Medical): Not on file  . Lack of Transportation (Non-Medical): Not on file  Physical Activity:   . Days of Exercise per Week: Not on file  . Minutes of Exercise per Session: Not on file  Stress:   .  Feeling of Stress : Not on file  Social Connections:   . Frequency of Communication with Friends and Family: Not on file  . Frequency of Social Gatherings with Friends and Family: Not on file  . Attends Religious Services: Not on file  . Active Member of Clubs or Organizations: Not on file  . Attends Archivist Meetings: Not on file  . Marital Status: Not on file  Intimate Partner Violence:   . Fear of Current or Ex-Partner: Not on file  . Emotionally Abused: Not on file  . Physically Abused: Not on file  . Sexually Abused: Not on file     ROS- All systems are reviewed and negative except as per the HPI above.  Physical Exam: Vitals:   04/11/20 0933  BP: 110/70  Pulse: 86  Weight: 65.7 kg  Height: 5\' 2"  (1.575 m)    GEN- The patient is well appearing elderly female, alert and oriented x 3 today.   HEENT-head normocephalic, atraumatic, sclera clear, conjunctiva pink, hearing  intact, trachea midline. Lungs- Clear to ausculation bilaterally, diminished breath sounds, normal work of breathing Heart- irregular rate and rhythm, no murmurs, rubs or gallops  GI- soft, NT, ND, + BS Extremities- no clubbing, cyanosis, or edema MS- no significant deformity or atrophy Skin- no rash or lesion Psych- euthymic mood, full affect Neuro- strength and sensation are intact   Wt Readings from Last 3 Encounters:  04/11/20 65.7 kg  04/10/20 70 kg  03/12/20 63.6 kg    EKG today demonstrates afib HR 86, PVCs, QRS 102, QTc 447  Echo 08/16/19 demonstrated  1. Left ventricular ejection fraction, by estimation, is 60 to 65%. The  left ventricle has normal function. The left ventricle has no regional  wall motion abnormalities. Left ventricular diastolic parameters are  indeterminate.  2. Right ventricular systolic function is moderately reduced. The right  ventricular size is moderately enlarged. moderately increased right  ventricular wall thickness. There is moderately elevated pulmonary artery  systolic pressure. The estimated right  ventricular systolic pressure is 71.2 mmHg.  3. Left atrial size was moderately dilated.  4. Right atrial size was moderately dilated.  5. The mitral valve is degenerative. Mild to moderate mitral valve  regurgitation.  6. Tricuspid valve regurgitation is severe.  7. The aortic valve is tricuspid. Aortic valve regurgitation is not  visualized. Mild to moderate aortic valve sclerosis/calcification is  present, without any evidence of aortic stenosis.  8. The inferior vena cava is dilated in size with <50% respiratory  variability, suggesting right atrial pressure of 15 mmHg.   Epic records are reviewed at length today  Assessment and Plan:  1. Persistent atrial fibrillation Patient's AAD options are limited: did not tolerate Multaq 2/2 GI side effects, class IC contraindicated with CAD, would also avoid amiodarone given lung  disease.  We discussed dofetilide admission again and patient in agreement with plan. Patient has checked on price of dofetilide and it will be affordable.  Will forward medication list to PharmD to screen for QT prolonging agents.  Will start KCl 10 meq daily supplementation. K+ 3.6 on bmet yesterday.  QTc in SR 438 ms Continue diltiazem 180 mg daily and 30 mg PRN q 4hrs for heart racing. Continue Xarelto 20 mg daily.  This patients CHA2DS2-VASc Score and unadjusted Ischemic Stroke Rate (% per year) is equal to 7.2 % stroke rate/year from a score of 5  Above score calculated as 1 point each if present [CHF, HTN, DM, Vascular=MI/PAD/Aortic  Plaque, Age if 4-74, or Female] Above score calculated as 2 points each if present [Age > 75, or Stroke/TIA/TE]  2. HTN Stable, no changes today.  3. Obstructive sleep apnea Encouraged compliance with CPAP therapy.  4. CAD s/p CABG No anginal symptoms.   Follow up for dofetilide admission.    Addendum: Will stop amlodipine and change Xarelto to Eliquis per pharmacy recommendations prior to dofetilide admission. See phone not 04/10/20.   [facility] [address] [place], [place] [address] [phone] 04/11/2020 10:00 AM

## 2020-04-11 NOTE — Telephone Encounter (Signed)
Pt saw Clint Fenton, PA today 04/11/20, last labs 04/10/20 Creat 0.98, age 79, weight 65.7kg, CrCl 46.7, based on CrCl pt is not on appropriate dosage of Xarelto.  Based on CrCl 46.7, CrCl 15-50 pt should be on Xarelto 15mg  QD.    Looks like there is a telephone note from today 04/11/20, Fuller Canada, PharmD suggested switch from Xarelto to Eliquis 5mg  BID.  Will forward refill to her since this has previously been addressed by her.

## 2020-04-12 ENCOUNTER — Other Ambulatory Visit (HOSPITAL_COMMUNITY): Payer: Self-pay | Admitting: *Deleted

## 2020-04-12 MED ORDER — APIXABAN 5 MG PO TABS: 5.0000 mg | ORAL_TABLET | Freq: Two times a day (BID) | ORAL | 3 refills | Status: DC

## 2020-04-12 NOTE — Addendum Note (Signed)
Encounter addended by: Oliver Barre, PA on: 04/12/2020 3:48 PM  Actions taken: Clinical Note Signed

## 2020-04-12 NOTE — Telephone Encounter (Signed)
She is remained therapeutic despite changing medications without missed doses, I do feel it is okay to go ahead and bring her in for dofetilide load.  Thanks.

## 2020-04-17 NOTE — Telephone Encounter (Signed)
Patient notified to stop amlodipine and switch xarelto to eliquis 5mg  BID. Pt verbalized understanding.

## 2020-04-20 ENCOUNTER — Other Ambulatory Visit: Payer: Self-pay

## 2020-04-20 ENCOUNTER — Other Ambulatory Visit
Admission: RE | Admit: 2020-04-20 | Discharge: 2020-04-20 | Disposition: A | Discharge status: Home / Self Care | Payer: Medicare HMO | Source: Ambulatory Visit | Attending: Physician Assistant | Admitting: Physician Assistant

## 2020-04-20 DIAGNOSIS — Z01812 Encounter for preprocedural laboratory examination: Secondary | ICD-10-CM | POA: Diagnosis not present

## 2020-04-20 DIAGNOSIS — Z20822 Contact with and (suspected) exposure to covid-19: Secondary | ICD-10-CM | POA: Insufficient documentation

## 2020-04-21 LAB — SARS CORONAVIRUS 2 (TAT 6-24 HRS): SARS Coronavirus 2: NEGATIVE

## 2020-04-23 ENCOUNTER — Encounter (HOSPITAL_COMMUNITY): Payer: Self-pay | Admitting: Internal Medicine

## 2020-04-23 ENCOUNTER — Inpatient Hospital Stay (HOSPITAL_COMMUNITY)
Admission: RE | Admit: 2020-04-23 | Discharge: 2020-04-26 | DRG: 310 | Disposition: A | Discharge status: Home / Self Care | Payer: Medicare HMO | Attending: Cardiology | Admitting: Cardiology

## 2020-04-23 ENCOUNTER — Ambulatory Visit (HOSPITAL_COMMUNITY)
Admission: RE | Admit: 2020-04-23 | Discharge: 2020-04-23 | Disposition: A | Discharge status: Home / Self Care | Payer: Medicare HMO | Source: Ambulatory Visit | Attending: Physician Assistant | Admitting: Physician Assistant

## 2020-04-23 ENCOUNTER — Other Ambulatory Visit: Payer: Self-pay

## 2020-04-23 ENCOUNTER — Encounter (HOSPITAL_COMMUNITY): Payer: Self-pay | Admitting: Physician Assistant

## 2020-04-23 VITALS — BP 112/82 | HR 86 | Ht 62.0 in | Wt 134.0 lb

## 2020-04-23 DIAGNOSIS — N289 Disorder of kidney and ureter, unspecified: Secondary | ICD-10-CM | POA: Diagnosis not present

## 2020-04-23 DIAGNOSIS — E785 Hyperlipidemia, unspecified: Secondary | ICD-10-CM | POA: Diagnosis present

## 2020-04-23 DIAGNOSIS — Z9841 Cataract extraction status, right eye: Secondary | ICD-10-CM | POA: Diagnosis not present

## 2020-04-23 DIAGNOSIS — Z9851 Tubal ligation status: Secondary | ICD-10-CM | POA: Diagnosis not present

## 2020-04-23 DIAGNOSIS — I4819 Other persistent atrial fibrillation: Secondary | ICD-10-CM

## 2020-04-23 DIAGNOSIS — Z79899 Other long term (current) drug therapy: Secondary | ICD-10-CM

## 2020-04-23 DIAGNOSIS — I714 Abdominal aortic aneurysm, without rupture: Secondary | ICD-10-CM | POA: Diagnosis present

## 2020-04-23 DIAGNOSIS — Z8249 Family history of ischemic heart disease and other diseases of the circulatory system: Secondary | ICD-10-CM

## 2020-04-23 DIAGNOSIS — G4733 Obstructive sleep apnea (adult) (pediatric): Secondary | ICD-10-CM | POA: Diagnosis present

## 2020-04-23 DIAGNOSIS — K219 Gastro-esophageal reflux disease without esophagitis: Secondary | ICD-10-CM | POA: Diagnosis not present

## 2020-04-23 DIAGNOSIS — I2510 Atherosclerotic heart disease of native coronary artery without angina pectoris: Secondary | ICD-10-CM | POA: Diagnosis not present

## 2020-04-23 DIAGNOSIS — I272 Pulmonary hypertension, unspecified: Secondary | ICD-10-CM | POA: Diagnosis not present

## 2020-04-23 DIAGNOSIS — I252 Old myocardial infarction: Secondary | ICD-10-CM

## 2020-04-23 DIAGNOSIS — Z20822 Contact with and (suspected) exposure to covid-19: Secondary | ICD-10-CM | POA: Diagnosis not present

## 2020-04-23 DIAGNOSIS — M810 Age-related osteoporosis without current pathological fracture: Secondary | ICD-10-CM | POA: Diagnosis present

## 2020-04-23 DIAGNOSIS — I10 Essential (primary) hypertension: Secondary | ICD-10-CM | POA: Diagnosis present

## 2020-04-23 DIAGNOSIS — I4891 Unspecified atrial fibrillation: Secondary | ICD-10-CM | POA: Diagnosis not present

## 2020-04-23 DIAGNOSIS — Z961 Presence of intraocular lens: Secondary | ICD-10-CM | POA: Diagnosis present

## 2020-04-23 DIAGNOSIS — Z91048 Other nonmedicinal substance allergy status: Secondary | ICD-10-CM

## 2020-04-23 DIAGNOSIS — Z885 Allergy status to narcotic agent status: Secondary | ICD-10-CM

## 2020-04-23 DIAGNOSIS — I739 Peripheral vascular disease, unspecified: Secondary | ICD-10-CM | POA: Diagnosis present

## 2020-04-23 DIAGNOSIS — D6869 Other thrombophilia: Secondary | ICD-10-CM

## 2020-04-23 DIAGNOSIS — Z951 Presence of aortocoronary bypass graft: Secondary | ICD-10-CM

## 2020-04-23 DIAGNOSIS — Z85828 Personal history of other malignant neoplasm of skin: Secondary | ICD-10-CM | POA: Diagnosis not present

## 2020-04-23 DIAGNOSIS — E039 Hypothyroidism, unspecified: Secondary | ICD-10-CM | POA: Diagnosis present

## 2020-04-23 DIAGNOSIS — Z888 Allergy status to other drugs, medicaments and biological substances status: Secondary | ICD-10-CM

## 2020-04-23 DIAGNOSIS — Z86711 Personal history of pulmonary embolism: Secondary | ICD-10-CM

## 2020-04-23 DIAGNOSIS — Z881 Allergy status to other antibiotic agents status: Secondary | ICD-10-CM

## 2020-04-23 DIAGNOSIS — J449 Chronic obstructive pulmonary disease, unspecified: Secondary | ICD-10-CM | POA: Diagnosis not present

## 2020-04-23 DIAGNOSIS — Z7901 Long term (current) use of anticoagulants: Secondary | ICD-10-CM

## 2020-04-23 DIAGNOSIS — Z9842 Cataract extraction status, left eye: Secondary | ICD-10-CM | POA: Diagnosis not present

## 2020-04-23 DIAGNOSIS — Z87891 Personal history of nicotine dependence: Secondary | ICD-10-CM

## 2020-04-23 DIAGNOSIS — Z91013 Allergy to seafood: Secondary | ICD-10-CM

## 2020-04-23 DIAGNOSIS — Z880 Allergy status to penicillin: Secondary | ICD-10-CM

## 2020-04-23 DIAGNOSIS — I480 Paroxysmal atrial fibrillation: Secondary | ICD-10-CM | POA: Diagnosis not present

## 2020-04-23 DIAGNOSIS — I110 Hypertensive heart disease with heart failure: Secondary | ICD-10-CM | POA: Diagnosis not present

## 2020-04-23 DIAGNOSIS — I5033 Acute on chronic diastolic (congestive) heart failure: Secondary | ICD-10-CM | POA: Diagnosis not present

## 2020-04-23 LAB — BASIC METABOLIC PANEL
Anion gap: 11 (ref 5–15)
BUN: 11 mg/dL (ref 8–23)
CO2: 28 mmol/L (ref 22–32)
Calcium: 9.7 mg/dL (ref 8.9–10.3)
Chloride: 102 mmol/L (ref 98–111)
Creatinine, Ser: 1.07 mg/dL — ABNORMAL HIGH (ref 0.44–1.00)
GFR, Estimated: 52 mL/min — ABNORMAL LOW (ref >60)
Glucose, Bld: 111 mg/dL — ABNORMAL HIGH (ref 70–99)
Potassium: 3.9 mmol/L (ref 3.5–5.1)
Sodium: 141 mmol/L (ref 135–145)

## 2020-04-23 LAB — MAGNESIUM: Magnesium: 2 mg/dL (ref 1.7–2.4)

## 2020-04-23 MED ORDER — LEVOTHYROXINE SODIUM 50 MCG PO TABS
50.0000 ug | ORAL_TABLET | Freq: Every day | ORAL | Status: DC
  Administered 2020-04-24: 50 ug via ORAL

## 2020-04-23 MED ORDER — APIXABAN 5 MG PO TABS: 5.0000 mg | ORAL_TABLET | Freq: Two times a day (BID) | ORAL | Status: DC

## 2020-04-23 MED ORDER — EZETIMIBE 10 MG PO TABS
10.0000 mg | ORAL_TABLET | Freq: Every day | ORAL | Status: DC
  Administered 2020-04-24 – 2020-04-26 (×3): 10 mg via ORAL
  Filled 2020-04-23 (×3): qty 1

## 2020-04-23 MED ORDER — FLUTICASONE FUROATE-VILANTEROL 100-25 MCG/INH IN AEPB
1.0000 | INHALATION_SPRAY | Freq: Every day | RESPIRATORY_TRACT | Status: DC
  Administered 2020-04-25 – 2020-04-26 (×2): 1 via RESPIRATORY_TRACT
  Filled 2020-04-23: qty 28

## 2020-04-23 MED ORDER — ALBUTEROL SULFATE (5 MG/ML) 0.5% IN NEBU: 2.5000 mg | INHALATION_SOLUTION | Freq: Four times a day (QID) | RESPIRATORY_TRACT | Status: DC | PRN

## 2020-04-23 MED ORDER — FUROSEMIDE 20 MG PO TABS
20.0000 mg | ORAL_TABLET | Freq: Every day | ORAL | Status: DC
  Administered 2020-04-24 – 2020-04-26 (×3): 20 mg via ORAL
  Filled 2020-04-23 (×3): qty 1

## 2020-04-23 MED ORDER — ALBUTEROL SULFATE HFA 108 (90 BASE) MCG/ACT IN AERS: 2.0000 | INHALATION_SPRAY | Freq: Four times a day (QID) | RESPIRATORY_TRACT | Status: DC | PRN

## 2020-04-23 MED ORDER — SODIUM CHLORIDE 0.9% FLUSH: 3.0000 mL | INTRAVENOUS | Status: DC | PRN

## 2020-04-23 MED ORDER — APIXABAN 5 MG PO TABS
5.0000 mg | ORAL_TABLET | Freq: Two times a day (BID) | ORAL | Status: DC
  Administered 2020-04-23 – 2020-04-26 (×6): 5 mg via ORAL
  Filled 2020-04-23 (×6): qty 1

## 2020-04-23 MED ORDER — SODIUM CHLORIDE 0.9 % IV SOLN: 250.0000 mL | INTRAVENOUS | Status: DC | PRN

## 2020-04-23 MED ORDER — IPRATROPIUM BROMIDE 0.02 % IN SOLN: 0.5000 mg | Freq: Four times a day (QID) | RESPIRATORY_TRACT | Status: DC

## 2020-04-23 MED ORDER — UMECLIDINIUM BROMIDE 62.5 MCG/INH IN AEPB
1.0000 | INHALATION_SPRAY | Freq: Every day | RESPIRATORY_TRACT | Status: DC
  Administered 2020-04-25 – 2020-04-26 (×2): 1 via RESPIRATORY_TRACT
  Filled 2020-04-23: qty 7

## 2020-04-23 MED ORDER — DOFETILIDE 250 MCG PO CAPS
250.0000 ug | ORAL_CAPSULE | Freq: Two times a day (BID) | ORAL | Status: DC
  Administered 2020-04-23 – 2020-04-24 (×3): 250 ug via ORAL
  Filled 2020-04-23 (×3): qty 1

## 2020-04-23 MED ORDER — POLYVINYL ALCOHOL 1.4 % OP SOLN
1.0000 [drp] | Freq: Two times a day (BID) | OPHTHALMIC | Status: DC | PRN
  Filled 2020-04-23: qty 15

## 2020-04-23 MED ORDER — PANTOPRAZOLE SODIUM 20 MG PO TBEC
40.0000 mg | DELAYED_RELEASE_TABLET | Freq: Every day | ORAL | Status: DC
  Administered 2020-04-24 – 2020-04-26 (×3): 40 mg via ORAL
  Filled 2020-04-23 (×4): qty 2

## 2020-04-23 MED ORDER — NITROGLYCERIN 0.4 MG SL SUBL: 0.4000 mg | SUBLINGUAL_TABLET | SUBLINGUAL | Status: DC | PRN

## 2020-04-23 MED ORDER — ISOSORBIDE MONONITRATE ER 60 MG PO TB24
60.0000 mg | ORAL_TABLET | Freq: Two times a day (BID) | ORAL | Status: DC
  Administered 2020-04-23 – 2020-04-26 (×6): 60 mg via ORAL
  Filled 2020-04-23 (×6): qty 1

## 2020-04-23 MED ORDER — POLYETHYL GLYCOL-PROPYL GLYCOL 0.4-0.3 % OP SOLN: Freq: Two times a day (BID) | OPHTHALMIC | Status: DC | PRN

## 2020-04-23 MED ORDER — POTASSIUM CHLORIDE CRYS ER 10 MEQ PO TBCR
10.0000 meq | EXTENDED_RELEASE_TABLET | Freq: Every day | ORAL | Status: DC
  Administered 2020-04-24 – 2020-04-26 (×3): 10 meq via ORAL
  Filled 2020-04-23 (×5): qty 1

## 2020-04-23 MED ORDER — FLUTICASONE-UMECLIDIN-VILANT 100-62.5-25 MCG/INH IN AEPB: 1.0000 | INHALATION_SPRAY | Freq: Every day | RESPIRATORY_TRACT | Status: DC

## 2020-04-23 MED ORDER — IPRATROPIUM BROMIDE 0.02 % IN SOLN
0.5000 mg | Freq: Four times a day (QID) | RESPIRATORY_TRACT | Status: DC
  Filled 2020-04-23: qty 2.5

## 2020-04-23 MED ORDER — DILTIAZEM HCL ER COATED BEADS 240 MG PO CP24
240.0000 mg | ORAL_CAPSULE | Freq: Every day | ORAL | Status: DC
  Administered 2020-04-24 – 2020-04-26 (×3): 240 mg via ORAL
  Filled 2020-04-23 (×3): qty 1

## 2020-04-23 MED ORDER — ROSUVASTATIN CALCIUM 20 MG PO TABS
40.0000 mg | ORAL_TABLET | Freq: Every day | ORAL | Status: DC
  Administered 2020-04-24 – 2020-04-26 (×3): 40 mg via ORAL
  Filled 2020-04-23 (×3): qty 2

## 2020-04-23 MED ORDER — ALBUTEROL SULFATE (2.5 MG/3ML) 0.083% IN NEBU: 2.5000 mg | INHALATION_SOLUTION | RESPIRATORY_TRACT | Status: DC | PRN

## 2020-04-23 MED ORDER — POTASSIUM CHLORIDE CRYS ER 20 MEQ PO TBCR
20.0000 meq | EXTENDED_RELEASE_TABLET | Freq: Once | ORAL | Status: AC
  Administered 2020-04-23: 20 meq via ORAL
  Filled 2020-04-23: qty 1

## 2020-04-23 MED ORDER — AZELASTINE HCL 0.1 % NA SOLN
2.0000 | Freq: Every evening | NASAL | Status: DC | PRN
  Filled 2020-04-23: qty 30

## 2020-04-23 MED ORDER — SODIUM CHLORIDE 0.9% FLUSH
3.0000 mL | Freq: Two times a day (BID) | INTRAVENOUS | Status: DC
  Administered 2020-04-23 – 2020-04-26 (×5): 3 mL via INTRAVENOUS

## 2020-04-23 MED ORDER — HYDROCODONE-ACETAMINOPHEN 10-325 MG PO TABS
1.0000 | ORAL_TABLET | Freq: Four times a day (QID) | ORAL | Status: DC | PRN
  Administered 2020-04-23 – 2020-04-25 (×4): 1 via ORAL
  Filled 2020-04-23 (×4): qty 1

## 2020-04-23 MED ORDER — APIXABAN 2.5 MG PO TABS: 2.5000 mg | ORAL_TABLET | Freq: Two times a day (BID) | ORAL | Status: DC

## 2020-04-23 MED ORDER — ACETAMINOPHEN 500 MG PO TABS
1000.0000 mg | ORAL_TABLET | Freq: Two times a day (BID) | ORAL | Status: DC
  Filled 2020-04-23: qty 2

## 2020-04-23 MED ORDER — ACETAMINOPHEN 500 MG PO TABS
1000.0000 mg | ORAL_TABLET | Freq: Two times a day (BID) | ORAL | Status: DC
  Administered 2020-04-23 – 2020-04-26 (×6): 1000 mg via ORAL
  Filled 2020-04-23 (×6): qty 2

## 2020-04-23 MED ORDER — ACETAMINOPHEN ER 650 MG PO TBCR: 1300.0000 mg | EXTENDED_RELEASE_TABLET | Freq: Two times a day (BID) | ORAL | Status: DC

## 2020-04-23 MED ORDER — CLOPIDOGREL BISULFATE 75 MG PO TABS
75.0000 mg | ORAL_TABLET | Freq: Every day | ORAL | Status: DC
  Administered 2020-04-24 – 2020-04-26 (×3): 75 mg via ORAL
  Filled 2020-04-23 (×3): qty 1

## 2020-04-23 MED ORDER — MAGNESIUM SULFATE 2 GM/50ML IV SOLN
2.0000 g | Freq: Once | INTRAVENOUS | Status: AC
  Administered 2020-04-23: 2 g via INTRAVENOUS
  Filled 2020-04-23: qty 50

## 2020-04-23 NOTE — H&P (Addendum)
Primary Care Physician: Mosie Lukes, MD Primary Cardiologist: Dr Rockey Situ Primary Electrophysiologist: Dr Curt Bears Referring Physician: [facility]   Andrea [name] is a 79 y.o. female with a history ofcoronary disease status post CABG in 1999 status post PCI to the distal RCA in 2013, PCI x2 to the SVG to the RCA into the mid circumflex in 2017, PVD status post lower extremity stenting, carotid artery disease status post left-sided CEA, pulmonary hypertension, valvular heart disease, COPD, infrarenal AAA, PE, hypertension, hyperlipidemia, asthma, hypothyroidism, SVT/atrial tach, and persistent atrial fibrillation who presents for follow up in the [facility].  The patient was initially diagnosed with atrial fibrillation on 11/07/18 after presenting to the ER with symptoms of palpitations, dyspnea, and chest discomfort. At the ER, she was found to be in rate controlled afib. She was started on Xarelto. Patient was hospitalized for a COPD exacerbation 07/2019 and was found to be in afib at that time. Echo 08/16/19 showed preserved EF with mod dilated LA. She saw Dr Curt Bears on 09/22/19 and DCCV was recommended. Patient is s/p DCCV 10/19/19. After the procedure, she reports that she did not feel improved in SR. She saw Dr Curt Bears 02/16/20 and complained of increased fatigue, weakness, and edema. These were felt to be 2/2 her afib despite rate control. She was seen at the ED 04/10/20 for CP and was treated for suspected COPD exacerbation.   On follow up today, patient presents for dofetilide admission. She remains in rate controlled afib with symptoms of SOB and fatigue although she admits she does feel better today than her last visit. She denies any missed doses of anticoagulation in the last 3 weeks.   Today, she denies symptoms of palpitations, chest pain, orthopnea, PND, dizziness, presyncope, syncope, bleeding, or neurologic sequela. The patient is tolerating  medications without difficulties and is otherwise without complaint today.    Atrial Fibrillation Risk Factors:  she does have symptoms or diagnosis of sleep apnea. she does not have a history of rheumatic fever. she does not have a history of alcohol use. The patient does not have a history of early familial atrial fibrillation or other arrhythmias.  she has a BMI of Body mass index is 24.51 kg/m.Marland Kitchen Filed Weights   04/23/20 1011  Weight: 60.8 kg         Family History  Problem Relation Age of Onset  . Heart attack Mother 86  . Diabetes Mother   . Colon cancer Father 27  . Lung cancer Father        smoked  . Heart disease Father   . Hypertension Father   . Angina Father   . Lung cancer Sister        smoked  . Hypothyroidism Sister   . Hypertension Brother   . Heart attack Brother   . Heart disease Brother        PTCA with Stent  . Heart attack Brother        CABG  . Heart disease Brother        CABG with 1 bypass  . Aneurysm Brother        brain  . Alcohol abuse Brother   . Hyperlipidemia Daughter   . Diabetes Maternal Grandmother   . Stroke Maternal Grandmother   . Cirrhosis Maternal Grandfather   . Stroke Paternal Grandmother   . Obesity Daughter   . Obesity Son      Atrial Fibrillation Management history:  Previous antiarrhythmic drugs:  Multaq (stopped 2/2 side effects) Previous cardioversions: 10/19/19 Previous ablations: EP study 03/19/18 (tachy not inducible. SVT) CHADS2VASC score: 5 Anticoagulation history: Xarelto        Past Medical History:  Diagnosis Date  . AAA (abdominal aortic aneurysm) ([place]) 01/2009   AAA (2.8 x 3.0)  moderate RAS (left); 2.7 x 2.7 cm (07/11/05)  . Acute bronchitis 03/20/2013; 2017  . Angina   . Anosmia   . Asthma   . Baker's cyst of knee 01/30/2014  . Basal cell carcinoma    "back and left arm"  . Bradycardia    Metoprolol stopped 08/2011  . CAD (coronary artery  disease)    s/CABG (reports IMA and 2 SVGs) back in 1999  Myoview normal 3/10; s/p PCI with DES to PL branch of distal RCA 09/2011; PCI +DES to SVG-RCA, PCI + DES to mid LCx 12/2015  . Cerebrovascular disease 01/2009   carotid u/s  R 0-39%   L 60-79%  . Chronic thoracic back pain   . COPD (chronic obstructive pulmonary disease) ([place])   . Depression   . Dizziness   . Dysrhythmia    hx of sinus brady  . GERD (gastroesophageal reflux disease)   . Heart murmur   . Herniated lumbar disc without myelopathy   . History of blood transfusion 1999   "when I had the bypass; had a PE"  . Hyperglycemia 10/23/2015  . Hyperlipidemia   . Hypertension   . Hypothyroidism 09/30/2016  . Medicare annual wellness visit, subsequent 07/31/2013  . NSTEMI (non-ST elevated myocardial infarction) ([place]) 11/12   Cath showed atretic IMA graft to the LAD, SVG to PD was patent but the continuation of this graft to the PL branch was occluded; there were L to R collaterals; Mid LAD had a 60 to 70% stenosis. She has been treated medically.  Neg Myoview 05/2011  . Osteoarthritis of back   . Osteoporosis   . Pneumonia "several times"  . Pulmonary embolism ([place]) 1999   "after my bypass"  . PVD (peripheral vascular disease) ([place])   . Shortness of breath   . Squamous carcinoma    "nose"  . Thyroid disease    Hypothyroid  . Unstable angina ([place]) 09/25/2015        Past Surgical History:  Procedure Laterality Date  . ABDOMINAL AORTIC ANEURYSM REPAIR     pt denies this hx on 01/02/2016  . BASAL CELL CARCINOMA EXCISION    . CARDIAC CATHETERIZATION N/A 01/02/2016   Procedure: Left Heart Cath and Coronary Angiography;  Surgeon: Wellington Hampshire, MD;  Location: [facility];  Service: Cardiovascular;  Laterality: N/A;  . CARDIAC CATHETERIZATION  1996; 1999  . CARDIAC CATHETERIZATION N/A 06/25/2016   Procedure: Left Heart Cath and Cors/Grafts Angiography;  Surgeon: Wellington Hampshire, MD;  Location:  [facility];  Service: Cardiovascular;  Laterality: N/A;  . CARDIOVERSION N/A 10/19/2019   Procedure: CARDIOVERSION;  Surgeon: Jerline Pain, MD;  Location: [facility];  Service: Cardiovascular;  Laterality: N/A;  . CAROTID ENDARTERECTOMY Left 01/02/2011  . CATARACT EXTRACTION W/ INTRAOCULAR LENS  IMPLANT, BILATERAL    . CLOSED REDUCTION NASAL FRACTURE  11/2007  . CORONARY ANGIOPLASTY WITH STENT PLACEMENT  10/10/2011   drug eluting  to rc & saphenous  . CORONARY ARTERY BYPASS GRAFT  1999   "CABG X3"  . DILATION AND CURETTAGE OF UTERUS    . FEMORAL ARTERY STENT Bilateral   . FRACTURE SURGERY    . LEFT  HEART CATHETERIZATION WITH CORONARY/GRAFT ANGIOGRAM N/A 04/22/2011   Procedure: LEFT HEART CATHETERIZATION WITH Beatrix Fetters;  Surgeon: Jolaine Artist, MD;  Location: [facility] CATH LAB;  Service: Cardiovascular;  Laterality: N/A;  . LEFT HEART CATHETERIZATION WITH CORONARY/GRAFT ANGIOGRAM N/A 10/10/2011   Procedure: LEFT HEART CATHETERIZATION WITH Beatrix Fetters;  Surgeon: Peter M Martinique, MD;  Location: [facility] CATH LAB;  Service: Cardiovascular;  Laterality: N/A;  . NASAL SINUS SURGERY     twice  . SQUAMOUS CELL CARCINOMA EXCISION    . SVT ABLATION N/A 03/19/2018   Procedure: SVT ABLATION;  Surgeon: Constance Haw, MD;  Location: [facility];  Service: Cardiovascular;  Laterality: N/A;  . TUBAL LIGATION            Current Outpatient Medications  Medication Sig Dispense Refill  . acetaminophen (TYLENOL) 650 MG CR tablet Take 1,300 mg by mouth 2 (two) times daily.    Marland Kitchen albuterol (PROVENTIL) (2.5 MG/3ML) 0.083% nebulizer solution Take 3 mLs (2.5 mg total) by nebulization every 4 (four) hours as needed for wheezing or shortness of breath. Dx:J44.9 75 mL 12  . albuterol (PROVENTIL) (5 MG/ML) 0.5% nebulizer solution Take 0.5 mLs (2.5 mg total) by nebulization every 6 (six) hours as needed for wheezing or shortness of breath. 20 mL 2    . albuterol (VENTOLIN HFA) 108 (90 Base) MCG/ACT inhaler INHALE 2 PUFFS INTO THE LUNGS EVERY 6 (SIX) HOURS AS NEEDED FOR WHEEZING. 18 g 5  . apixaban (ELIQUIS) 5 MG TABS tablet Take 1 tablet (5 mg total) by mouth 2 (two) times daily. 60 tablet 3  . azelastine (ASTELIN) 0.1 % nasal spray Place 2 sprays into both nostrils at bedtime as needed for rhinitis. 30 mL 3  . Biotin 5 MG TABS Take 5 mg by mouth daily.     . Calcium Carbonate-Vitamin D (CALCIUM + D PO) Take 1 tablet by mouth daily.     . Cholecalciferol (VITAMIN D3) 2000 units TABS Take 2,000 Units by mouth daily.     . clopidogrel (PLAVIX) 75 MG tablet TAKE 1 TABLET BY MOUTH ONCE A DAY 90 tablet 1  . diltiazem (CARDIZEM CD) 240 MG 24 hr capsule Take 1 capsule (240 mg total) by mouth daily. 90 capsule 2  . diltiazem (CARDIZEM) 30 MG tablet Take one tablet by mouth every 4 hours AS NEEDED for A-fib HR > 100 as long as BP > 100. 45 tablet 0  . ezetimibe (ZETIA) 10 MG tablet TAKE 1 TABLET BY MOUTH DAILY 90 tablet 1  . Fluticasone-Umeclidin-Vilant (TRELEGY ELLIPTA) 100-62.5-25 MCG/INH AEPB Inhale 1 puff into the lungs daily. 60 each 5  . furosemide (LASIX) 20 MG tablet Take 1 tablet (20 mg total) by mouth as directed. Take 1 tablet (20 mg) daily and extra 1 tablet (20 mg) at 2 PM for shortness of breath or abdominal swelling 180 tablet 2  . HYDROcodone-acetaminophen (NORCO) 10-325 MG tablet Take 1 tablet by mouth every 6 (six) hours as needed for severe pain (pain.).     Marland Kitchen ipratropium (ATROVENT) 0.02 % nebulizer solution Take 2.5 mLs (0.5 mg total) by nebulization 4 (four) times daily. 75 mL 2  . isosorbide mononitrate (IMDUR) 60 MG 24 hr tablet TAKE 1 TABLET BY MOUTH TWICE A DAY 180 tablet 3  . Krill Oil 500 MG CAPS Take 500 mg by mouth daily.     Marland Kitchen levothyroxine (SYNTHROID) 50 MCG tablet TAKE 1 TABLET BY MOUTH ONCE DAILY 90 tablet 3  . neomycin-polymyxin  b-dexamethasone (MAXITROL) 3.5-10000-0.1 SUSP     . nitroGLYCERIN (NITROSTAT)  0.4 MG SL tablet Place 1 tablet (0.4 mg total) under the tongue every 5 (five) minutes as needed for chest pain. 25 tablet 0  . omeprazole (PRILOSEC) 20 MG capsule Take 1 capsule (20 mg total) by mouth daily. 90 capsule 3  . Polyethyl Glycol-Propyl Glycol (SYSTANE OP) Place 1 drop into both eyes 2 (two) times daily as needed (dry/irritated eyes.).     Marland Kitchen potassium chloride (KLOR-CON) 10 MEQ tablet TAKE 1 TABLET BY MOUTH ONCE A DAY 90 tablet 0  . rosuvastatin (CRESTOR) 40 MG tablet TAKE 1 TABLET BY MOUTH ONCE DAILY 90 tablet 2  . Vitamin D, Ergocalciferol, (DRISDOL) 1.25 MG (50000 UNIT) CAPS capsule TAKE 1 CAPSULE (50,000 UNITS) BY MOUTH ONCE A WEEK. 12 capsule 1  . zinc gluconate 50 MG tablet Take 50 mg by mouth daily.    . sertraline (ZOLOFT) 100 MG tablet Take 1 tablet (100 mg total) by mouth daily. (Patient not taking: Reported on 04/23/2020) 90 tablet 3   No current facility-administered medications for this encounter.         Allergies  Allergen Reactions  . Erythromycin Other (See Comments)    Tongue burns  . Meperidine Nausea And Vomiting  . Shellfish Allergy Nausea And Vomiting  . Ciprofloxacin Rash and Other (See Comments)    Rash from IV  . Codeine Nausea And Vomiting  . Penicillins Rash and Other (See Comments)    Has patient had a PCN reaction causing immediate rash, facial/tongue/throat swelling, SOB or lightheadedness with hypotension:unsure Has patient had a PCN reaction causing severe rash involving mucus membranes or skin necrosis:No Has patient had a PCN reaction that required hospitalization:No Has patient had a PCN reaction occurring within the last 10 years:No If all of the above answers are "NO", then may proceed with Cephalosporin use.   . Tape Rash    pls use paper tape    Social History        Socioeconomic History  . Marital status: Married    Spouse name: Not on file  . Number of children: 5  . Years of education: Not on file  .  Highest education level: Not on file  Occupational History  . 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 Smoking status: Former Smoker    Packs/day: 0.50    Years: 40.00    Pack years: 20.00    Types: Cigarettes    Quit date: 01/19/1993    Years since quitting: 27.2  . Smokeless tobacco: Never Used  Vaping Use  . Vaping Use: Never used  Substance and Sexual Activity  . Alcohol use: Yes    Comment: 01/02/2016 "might drink a glass of wine socially q 3-4 months"  . Drug use: No  . Sexual activity: Not Currently    Birth control/protection: Post-menopausal  Other Topics Concern  . Not on file  Social History Narrative   Married with 5 children   Social Determinants of Health      Financial Resource Strain:   . Difficulty of Paying Living Expenses: Not on file  Food Insecurity:   . Worried About Charity fundraiser in the Last Year: Not on file  . Ran Out of Food in the Last Year: Not on file  Transportation Needs:   . Lack of Transportation (Medical): Not on file  . Lack of Transportation (Non-Medical): Not  on file  Physical Activity:   . Days of Exercise per Week: Not on file  . Minutes of Exercise per Session: Not on file  Stress:   . Feeling of Stress : Not on file  Social Connections:   . Frequency of Communication with Friends and Family: Not on file  . Frequency of Social Gatherings with Friends and Family: Not on file  . Attends Religious Services: Not on file  . Active Member of Clubs or Organizations: Not on file  . Attends Archivist Meetings: Not on file  . Marital Status: Not on file  Intimate Partner Violence:   . Fear of Current or Ex-Partner: Not on file  . Emotionally Abused: Not on file  . Physically Abused: Not on file  . Sexually Abused: Not on file     ROS- All systems are reviewed and negative except as per the HPI above.  Physical Exam:    Vitals:   04/23/20 1011   BP: 112/82  Pulse: 86  Weight: 60.8 kg  Height: 5\' 2"  (1.575 m)    GEN- The patient is well appearing elderly female, alert and oriented x 3 today.   HEENT-head normocephalic, atraumatic, sclera clear, conjunctiva pink, hearing intact, trachea midline. Lungs- Clear to ausculation bilaterally, diminished breath sounds, normal work of breathing Heart- irregular rate and rhythm, no murmurs, rubs or gallops  GI- soft, NT, ND, + BS Extremities- no clubbing, cyanosis, or edema MS- no significant deformity or atrophy Skin- no rash or lesion Psych- euthymic mood, full affect Neuro- strength and sensation are intact      Wt Readings from Last 3 Encounters:  04/23/20 60.8 kg  04/11/20 65.7 kg  04/10/20 70 kg    EKG today demonstrates afib HR 86, QRS 90, QTc 437  Echo 08/16/19 demonstrated  1. Left ventricular ejection fraction, by estimation, is 60 to 65%. The  left ventricle has normal function. The left ventricle has no regional  wall motion abnormalities. Left ventricular diastolic parameters are  indeterminate.  2. Right ventricular systolic function is moderately reduced. The right  ventricular size is moderately enlarged. moderately increased right  ventricular wall thickness. There is moderately elevated pulmonary artery  systolic pressure. The estimated right  ventricular systolic pressure is 02.7 mmHg.  3. Left atrial size was moderately dilated.  4. Right atrial size was moderately dilated.  5. The mitral valve is degenerative. Mild to moderate mitral valve  regurgitation.  6. Tricuspid valve regurgitation is severe.  7. The aortic valve is tricuspid. Aortic valve regurgitation is not  visualized. Mild to moderate aortic valve sclerosis/calcification is  present, without any evidence of aortic stenosis.  8. The inferior vena cava is dilated in size with <50% respiratory  variability, suggesting right atrial pressure of 15 mmHg.   Epic records are  reviewed at length today  Assessment and Plan:  1. Persistent atrial fibrillation Patient's AAD options are limited: did not tolerate Multaq 2/2 GI side effects, class IC contraindicated with CAD, would also avoid amiodarone given lung disease.  Patient wants to pursue dofetilide, aware of risk vrs benefit Aware of price of dofetilide. Patient will continue Eliquis 5 mg BID, states no missed doses in the last 3 weeks. Will need to watch her weight closely.  No benadryl use PharmD has screened drugs and amlodipine discontinued, Xarelto changed to Eliquis. QTc in SR 438 ms, Labs today show creatinine at 1.07, K+ 3.9 and mag 2.0, CrCl calculated at 40 mL/min. Will have pt  take an extra dose of KCl.  Continue diltiazem 180 mg daily and 30 mg PRN for heart racing.  This patients CHA2DS2-VASc Score and unadjusted Ischemic Stroke Rate (% per year) is equal to 7.2 % stroke rate/year from a score of 5  Above score calculated as 1 point each if present [CHF, HTN, DM, Vascular=MI/PAD/Aortic Plaque, Age if 65-74, or Female] Above score calculated as 2 points each if present [Age > 75, or Stroke/TIA/TE]  2. HTN Stable, no changes today.  3. Obstructive sleep apnea Encouraged compliance with CPAP therapy.   4. CAD s/p CABG No anginal symptoms.   To be admitted later today once a bed becomes available.     [facility] [place], [place] [address] [phone] 04/23/2020 10:32 AM        Atrial fib -persistent  Fatigue  OSA  - CPAP not being monitored  Per Vas Dis s/p iliac stenting 2008  CAD  S/p CABG \ COPD   RV dysfunction// pulm HTN  Renal insufficiency Estimated Creatinine Clearance: 32.6 mL/min (A) (by C-G formula based on SCr of 1.07 mg/dL (H)).    Admitted for dofetilide therapy  Potential medication side effects were discussed with the patient including the concern of proarrhythmia and the need to  manage K/MG  Not clear whether the fatigue is related to the afib; she did not feel better at last DCCV but only held sinus for a few days.  She may need a longer trial, and have also discussed with her the possibility of needing recurrent cardioversion   She is also on Antiplatelet therapy in addition to her Apixoban  And not sure why   Reviewed notes and plavix goes back to 2008 -- have reached out to Dr MA who says no longer needed for per Vas Dis, and have also reached out to Dr Deidre Ala regarding its role for CAD-- suspect it could be stopped and reduce her risk of bleeding but he is very concerned about her vasculopathy   IN any case her Apixoban  Dose should be 2.5 mg bid and will reduce   With RV dysfunction will repeat the echo and look at PA pressures.  This may be affecting her AFib burden

## 2020-04-23 NOTE — Plan of Care (Signed)

## 2020-04-23 NOTE — Progress Notes (Signed)
Pharmacy: Dofetilide (Tikosyn) - Initial Consult Assessment and Electrolyte Replacement  Pharmacy consulted to assist in monitoring and replacing electrolytes in this 79 y.o. female admitted on 04/23/2020 undergoing dofetilide initiation. First dofetilide dose: 04/23/2020 2000  Assessment:  Patient Exclusion Criteria: If any screening criteria checked as "Yes", then  patient  should NOT receive dofetilide until criteria item is corrected.  If "Yes" please indicate correction plan.  YES  NO Patient  Exclusion Criteria Correction Plan   []   [x]   Baseline QTc interval is greater than or equal to 440 msec. IF above YES box checked dofetilide contraindicated unless patient has ICD; then may proceed if QTc 500-550 msec or with known ventricular conduction abnormalities may proceed with QTc 550-600 msec. QTc =      []   [x]   Patient is known or suspected to have a digoxin level greater than 2 ng/ml: No results found for: DIGOXIN     []   [x]   Creatinine clearance less than 20 ml/min (calculated using Cockcroft-Gault, actual body weight and serum creatinine): Estimated Creatinine Clearance: 32.6 mL/min (A) (by C-G formula based on SCr of 1.07 mg/dL (H)).     []   [x]  Patient has received drugs known to prolong the QT intervals within the last 48 hours (phenothiazines, tricyclics or tetracyclic antidepressants, erythromycin, H-1 antihistamines, cisapride, fluoroquinolones, azithromycin). Drugs not listed above may have an, as yet, undetected potential to prolong the QT interval, updated information on QT prolonging agents is available at this website:QT prolonging agents or www.crediblemeds.org    []   [x]   Patient received a dose of hydrochlorothiazide (Oretic) alone or in any combination including triamterene (Dyazide, Maxzide) in the last 48 hours.    []   [x]  Patient received a medication known to increase dofetilide plasma concentrations prior to initial dofetilide dose:  . Trimethoprim  (Primsol, Proloprim) in the last 36 hours . Verapamil (Calan, Verelan) in the last 36 hours or a sustained release dose in the last 72 hours . Megestrol (Megace) in the last 5 days  . Cimetidine (Tagamet) in the last 6 hours . Ketoconazole (Nizoral) in the last 24 hours . Itraconazole (Sporanox) in the last 48 hours  . Prochlorperazine (Compazine) in the last 36 hours     []   [x]   Patient is known to have a history of torsades de pointes; congenital or acquired long QT syndromes.    []   [x]   Patient has received a Class 1 antiarrhythmic with less than 2 half-lives since last dose. (Disopyramide, Quinidine, Procainamide, Lidocaine, Mexiletine, Flecainide, Propafenone)    []   [x]   Patient has received amiodarone therapy in the past 3 months or amiodarone level is greater than 0.3 ng/ml.    Patient has been appropriately anticoagulated with apixaban.  Labs:    Component Value Date/Time   K 3.9 04/23/2020 0953   MG 2.0 04/23/2020 0953     Plan: Potassium: K 3.8-3.9:  Hold Tikosyn initiation and give KCl 40 mEq po x1 then begin Tikosyn at least 2hr after KCl dose - do not need to recheck K   - pt took 20 mEq pta -will order an additional 20 mEq now Magnesium: Mg 1.8-2: Give Mg 2 gm IV x1 to prevent Mg from dropping below 1.8 - do not need to recheck Mg. Appropriate to initiate [place], PharmD, BCPS, BCCCP Clinical Pharmacist [phone]  Please check AMION for all [place] numbers  04/23/2020 4:44 PM

## 2020-04-23 NOTE — Progress Notes (Signed)
Primary Care Physician: Mosie Lukes, MD Primary Cardiologist: Dr Rockey Situ Primary Electrophysiologist: Dr Curt Bears Referring Physician: [facility]   Andrea [name] is a 79 y.o. female with a history ofcoronary disease status post CABG in 1999 status post PCI to the distal RCA in 2013, PCI x2 to the SVG to the RCA into the mid circumflex in 2017, PVD status post lower extremity stenting, carotid artery disease status post left-sided CEA, pulmonary hypertension, valvular heart disease, COPD, infrarenal AAA, PE, hypertension, hyperlipidemia, asthma, hypothyroidism, SVT/atrial tach, and persistent atrial fibrillation who presents for follow up in the [facility].  The patient was initially diagnosed with atrial fibrillation on 11/07/18 after presenting to the ER with symptoms of palpitations, dyspnea, and chest discomfort. At the ER, she was found to be in rate controlled afib. She was started on Xarelto. Patient was hospitalized for a COPD exacerbation 07/2019 and was found to be in afib at that time. Echo 08/16/19 showed preserved EF with mod dilated LA. She saw Dr Curt Bears on 09/22/19 and DCCV was recommended. Patient is s/p DCCV 10/19/19. After the procedure, she reports that she did not feel improved in SR. She saw Dr Curt Bears 02/16/20 and complained of increased fatigue, weakness, and edema. These were felt to be 2/2 her afib despite rate control. She was seen at the ED 04/10/20 for CP and was treated for suspected COPD exacerbation.   On follow up today, patient presents for dofetilide admission. She remains in rate controlled afib with symptoms of SOB and fatigue although she admits she does feel better today than her last visit. She denies any missed doses of anticoagulation in the last 3 weeks.   Today, she denies symptoms of palpitations, chest pain, orthopnea, PND, dizziness, presyncope, syncope, bleeding, or neurologic sequela. The patient is tolerating medications  without difficulties and is otherwise without complaint today.    Atrial Fibrillation Risk Factors:  she does have symptoms or diagnosis of sleep apnea. she does not have a history of rheumatic fever. she does not have a history of alcohol use. The patient does not have a history of early familial atrial fibrillation or other arrhythmias.  she has a BMI of Body mass index is 24.51 kg/m.Marland Kitchen Filed Weights   04/23/20 1011  Weight: 60.8 kg    Family History  Problem Relation Age of Onset  . Heart attack Mother 36  . Diabetes Mother   . Colon cancer Father 4  . Lung cancer Father        smoked  . Heart disease Father   . Hypertension Father   . Angina Father   . Lung cancer Sister        smoked  . Hypothyroidism Sister   . Hypertension Brother   . Heart attack Brother   . Heart disease Brother        PTCA with Stent  . Heart attack Brother        CABG  . Heart disease Brother        CABG with 1 bypass  . Aneurysm Brother        brain  . Alcohol abuse Brother   . Hyperlipidemia Daughter   . Diabetes Maternal Grandmother   . Stroke Maternal Grandmother   . Cirrhosis Maternal Grandfather   . Stroke Paternal Grandmother   . Obesity Daughter   . Obesity Son      Atrial Fibrillation Management history:  Previous antiarrhythmic drugs: Multaq (stopped 2/2 side effects)  Previous cardioversions: 10/19/19 Previous ablations: EP study 03/19/18 (tachy not inducible. SVT) CHADS2VASC score: 5 Anticoagulation history: Xarelto    Past Medical History:  Diagnosis Date  . AAA (abdominal aortic aneurysm) ([place]) 01/2009   AAA (2.8 x 3.0)  moderate RAS (left); 2.7 x 2.7 cm (07/11/05)  . Acute bronchitis 03/20/2013; 2017  . Angina   . Anosmia   . Asthma   . Baker's cyst of knee 01/30/2014  . Basal cell carcinoma    "back and left arm"  . Bradycardia    Metoprolol stopped 08/2011  . CAD (coronary artery disease)    s/CABG (reports IMA and 2 SVGs) back in 1999  Myoview normal  3/10; s/p PCI with DES to PL branch of distal RCA 09/2011; PCI +DES to SVG-RCA, PCI + DES to mid LCx 12/2015  . Cerebrovascular disease 01/2009   carotid u/s  R 0-39%   L 60-79%  . Chronic thoracic back pain   . COPD (chronic obstructive pulmonary disease) ([place])   . Depression   . Dizziness   . Dysrhythmia    hx of sinus brady  . GERD (gastroesophageal reflux disease)   . Heart murmur   . Herniated lumbar disc without myelopathy   . History of blood transfusion 1999   "when I had the bypass; had a PE"  . Hyperglycemia 10/23/2015  . Hyperlipidemia   . Hypertension   . Hypothyroidism 09/30/2016  . Medicare annual wellness visit, subsequent 07/31/2013  . NSTEMI (non-ST elevated myocardial infarction) ([place]) 11/12   Cath showed atretic IMA graft to the LAD, SVG to PD was patent but the continuation of this graft to the PL branch was occluded; there were L to R collaterals; Mid LAD had a 60 to 70% stenosis. She has been treated medically.  Neg Myoview 05/2011  . Osteoarthritis of back   . Osteoporosis   . Pneumonia "several times"  . Pulmonary embolism ([place]) 1999   "after my bypass"  . PVD (peripheral vascular disease) ([place])   . Shortness of breath   . Squamous carcinoma    "nose"  . Thyroid disease    Hypothyroid  . Unstable angina ([place]) 09/25/2015   Past Surgical History:  Procedure Laterality Date  . ABDOMINAL AORTIC ANEURYSM REPAIR     pt denies this hx on 01/02/2016  . BASAL CELL CARCINOMA EXCISION    . CARDIAC CATHETERIZATION N/A 01/02/2016   Procedure: Left Heart Cath and Coronary Angiography;  Surgeon: Wellington Hampshire, MD;  Location: [facility];  Service: Cardiovascular;  Laterality: N/A;  . CARDIAC CATHETERIZATION  1996; 1999  . CARDIAC CATHETERIZATION N/A 06/25/2016   Procedure: Left Heart Cath and Cors/Grafts Angiography;  Surgeon: Wellington Hampshire, MD;  Location: [place];  Service: Cardiovascular;  Laterality: N/A;  . CARDIOVERSION N/A 10/19/2019   Procedure:  CARDIOVERSION;  Surgeon: Jerline Pain, MD;  Location: [facility];  Service: Cardiovascular;  Laterality: N/A;  . CAROTID ENDARTERECTOMY Left 01/02/2011  . CATARACT EXTRACTION W/ INTRAOCULAR LENS  IMPLANT, BILATERAL    . CLOSED REDUCTION NASAL FRACTURE  11/2007  . CORONARY ANGIOPLASTY WITH STENT PLACEMENT  10/10/2011   drug eluting  to rc & saphenous  . CORONARY ARTERY BYPASS GRAFT  1999   "CABG X3"  . DILATION AND CURETTAGE OF UTERUS    . FEMORAL ARTERY STENT Bilateral   . FRACTURE SURGERY    . LEFT HEART CATHETERIZATION WITH CORONARY/GRAFT ANGIOGRAM N/A 04/22/2011   Procedure: LEFT HEART CATHETERIZATION WITH  Beatrix Fetters;  Surgeon: Jolaine Artist, MD;  Location[facility] LAB;  Service: Cardiovascular;  Laterality: N/A;  . LEFT HEART CATHETERIZATION WITH CORONARY/GRAFT ANGIOGRAM N/A 10/10/2011   Procedure: LEFT HEART CATHETERIZATION WITH Beatrix Fetters;  Surgeon: Peter M Martinique, MD;  Location: [facility] CATH LAB;  Service: Cardiovascular;  Laterality: N/A;  . NASAL SINUS SURGERY     twice  . SQUAMOUS CELL CARCINOMA EXCISION    . SVT ABLATION N/A 03/19/2018   Procedure: SVT ABLATION;  Surgeon: Constance Haw, MD;  Location: [place] [facility];  Service: Cardiovascular;  Laterality: N/A;  . TUBAL LIGATION      Current Outpatient Medications  Medication Sig Dispense Refill  . acetaminophen (TYLENOL) 650 MG CR tablet Take 1,300 mg by mouth 2 (two) times daily.    Marland Kitchen albuterol (PROVENTIL) (2.5 MG/3ML) 0.083% nebulizer solution Take 3 mLs (2.5 mg total) by nebulization every 4 (four) hours as needed for wheezing or shortness of breath. Dx:J44.9 75 mL 12  . albuterol (PROVENTIL) (5 MG/ML) 0.5% nebulizer solution Take 0.5 mLs (2.5 mg total) by nebulization every 6 (six) hours as needed for wheezing or shortness of breath. 20 mL 2  . albuterol (VENTOLIN HFA) 108 (90 Base) MCG/ACT inhaler INHALE 2 PUFFS INTO THE LUNGS EVERY 6 (SIX) HOURS AS NEEDED FOR WHEEZING. 18 g 5  .  apixaban (ELIQUIS) 5 MG TABS tablet Take 1 tablet (5 mg total) by mouth 2 (two) times daily. 60 tablet 3  . azelastine (ASTELIN) 0.1 % nasal spray Place 2 sprays into both nostrils at bedtime as needed for rhinitis. 30 mL 3  . Biotin 5 MG TABS Take 5 mg by mouth daily.     . Calcium Carbonate-Vitamin D (CALCIUM + D PO) Take 1 tablet by mouth daily.     . Cholecalciferol (VITAMIN D3) 2000 units TABS Take 2,000 Units by mouth daily.     . clopidogrel (PLAVIX) 75 MG tablet TAKE 1 TABLET BY MOUTH ONCE A DAY 90 tablet 1  . diltiazem (CARDIZEM CD) 240 MG 24 hr capsule Take 1 capsule (240 mg total) by mouth daily. 90 capsule 2  . diltiazem (CARDIZEM) 30 MG tablet Take one tablet by mouth every 4 hours AS NEEDED for A-fib HR > 100 as long as BP > 100. 45 tablet 0  . ezetimibe (ZETIA) 10 MG tablet TAKE 1 TABLET BY MOUTH DAILY 90 tablet 1  . Fluticasone-Umeclidin-Vilant (TRELEGY ELLIPTA) 100-62.5-25 MCG/INH AEPB Inhale 1 puff into the lungs daily. 60 each 5  . furosemide (LASIX) 20 MG tablet Take 1 tablet (20 mg total) by mouth as directed. Take 1 tablet (20 mg) daily and extra 1 tablet (20 mg) at 2 PM for shortness of breath or abdominal swelling 180 tablet 2  . HYDROcodone-acetaminophen (NORCO) 10-325 MG tablet Take 1 tablet by mouth every 6 (six) hours as needed for severe pain (pain.).     Marland Kitchen ipratropium (ATROVENT) 0.02 % nebulizer solution Take 2.5 mLs (0.5 mg total) by nebulization 4 (four) times daily. 75 mL 2  . isosorbide mononitrate (IMDUR) 60 MG 24 hr tablet TAKE 1 TABLET BY MOUTH TWICE A DAY 180 tablet 3  . Krill Oil 500 MG CAPS Take 500 mg by mouth daily.     Marland Kitchen levothyroxine (SYNTHROID) 50 MCG tablet TAKE 1 TABLET BY MOUTH ONCE DAILY 90 tablet 3  . neomycin-polymyxin b-dexamethasone (MAXITROL) 3.5-10000-0.1 SUSP     . nitroGLYCERIN (NITROSTAT) 0.4 MG SL tablet Place 1 tablet (0.4 mg total)  under the tongue every 5 (five) minutes as needed for chest pain. 25 tablet 0  . omeprazole (PRILOSEC) 20 MG  capsule Take 1 capsule (20 mg total) by mouth daily. 90 capsule 3  . Polyethyl Glycol-Propyl Glycol (SYSTANE OP) Place 1 drop into both eyes 2 (two) times daily as needed (dry/irritated eyes.).     Marland Kitchen potassium chloride (KLOR-CON) 10 MEQ tablet TAKE 1 TABLET BY MOUTH ONCE A DAY 90 tablet 0  . rosuvastatin (CRESTOR) 40 MG tablet TAKE 1 TABLET BY MOUTH ONCE DAILY 90 tablet 2  . Vitamin D, Ergocalciferol, (DRISDOL) 1.25 MG (50000 UNIT) CAPS capsule TAKE 1 CAPSULE (50,000 UNITS) BY MOUTH ONCE A WEEK. 12 capsule 1  . zinc gluconate 50 MG tablet Take 50 mg by mouth daily.    . sertraline (ZOLOFT) 100 MG tablet Take 1 tablet (100 mg total) by mouth daily. (Patient not taking: Reported on 04/23/2020) 90 tablet 3   No current facility-administered medications for this encounter.    Allergies  Allergen Reactions  . Erythromycin Other (See Comments)    Tongue burns  . Meperidine Nausea And Vomiting  . Shellfish Allergy Nausea And Vomiting  . Ciprofloxacin Rash and Other (See Comments)    Rash from IV  . Codeine Nausea And Vomiting  . Penicillins Rash and Other (See Comments)    Has patient had a PCN reaction causing immediate rash, facial/tongue/throat swelling, SOB or lightheadedness with hypotension:unsure Has patient had a PCN reaction causing severe rash involving mucus membranes or skin necrosis:No Has patient had a PCN reaction that required hospitalization:No Has patient had a PCN reaction occurring within the last 10 years:No If all of the above answers are "NO", then may proceed with Cephalosporin use.   . Tape Rash    pls use paper tape    Social History   Socioeconomic History  . Marital status: Married    Spouse name: Not on file  . Number of children: 5  . Years of education: Not on file  . Highest education level: Not on file  Occupational History  . 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 Smoking status: Former Smoker     Packs/day: 0.50    Years: 40.00    Pack years: 20.00    Types: Cigarettes    Quit date: 01/19/1993    Years since quitting: 27.2  . Smokeless tobacco: Never Used  Vaping Use  . Vaping Use: Never used  Substance and Sexual Activity  . Alcohol use: Yes    Comment: 01/02/2016 "might drink a glass of wine socially q 3-4 months"  . Drug use: No  . Sexual activity: Not Currently    Birth control/protection: Post-menopausal  Other Topics Concern  . Not on file  Social History Narrative   Married with 5 children   Social Determinants of Health   Financial Resource Strain:   . Difficulty of Paying Living Expenses: Not on file  Food Insecurity:   . Worried About Charity fundraiser in the Last Year: Not on file  . Ran Out of Food in the Last Year: Not on file  Transportation Needs:   . Lack of Transportation (Medical): Not on file  . Lack of Transportation (Non-Medical): Not on file  Physical Activity:   . Days of Exercise per Week: Not on file  . Minutes of Exercise per Session: Not on file  Stress:   . Feeling of Stress :  Not on file  Social Connections:   . Frequency of Communication with Friends and Family: Not on file  . Frequency of Social Gatherings with Friends and Family: Not on file  . Attends Religious Services: Not on file  . Active Member of Clubs or Organizations: Not on file  . Attends Archivist Meetings: Not on file  . Marital Status: Not on file  Intimate Partner Violence:   . Fear of Current or Ex-Partner: Not on file  . Emotionally Abused: Not on file  . Physically Abused: Not on file  . Sexually Abused: Not on file     ROS- All systems are reviewed and negative except as per the HPI above.  Physical Exam: Vitals:   04/23/20 1011  BP: 112/82  Pulse: 86  Weight: 60.8 kg  Height: 5\' 2"  (1.575 m)    GEN- The patient is well appearing elderly female, alert and oriented x 3 today.   HEENT-head normocephalic, atraumatic, sclera clear,  conjunctiva pink, hearing intact, trachea midline. Lungs- Clear to ausculation bilaterally, diminished breath sounds, normal work of breathing Heart- irregular rate and rhythm, no murmurs, rubs or gallops  GI- soft, NT, ND, + BS Extremities- no clubbing, cyanosis, or edema MS- no significant deformity or atrophy Skin- no rash or lesion Psych- euthymic mood, full affect Neuro- strength and sensation are intact   Wt Readings from Last 3 Encounters:  04/23/20 60.8 kg  04/11/20 65.7 kg  04/10/20 70 kg    EKG today demonstrates afib HR 86, QRS 90, QTc 437  Echo 08/16/19 demonstrated  1. Left ventricular ejection fraction, by estimation, is 60 to 65%. The  left ventricle has normal function. The left ventricle has no regional  wall motion abnormalities. Left ventricular diastolic parameters are  indeterminate.  2. Right ventricular systolic function is moderately reduced. The right  ventricular size is moderately enlarged. moderately increased right  ventricular wall thickness. There is moderately elevated pulmonary artery  systolic pressure. The estimated right  ventricular systolic pressure is 24.0 mmHg.  3. Left atrial size was moderately dilated.  4. Right atrial size was moderately dilated.  5. The mitral valve is degenerative. Mild to moderate mitral valve  regurgitation.  6. Tricuspid valve regurgitation is severe.  7. The aortic valve is tricuspid. Aortic valve regurgitation is not  visualized. Mild to moderate aortic valve sclerosis/calcification is  present, without any evidence of aortic stenosis.  8. The inferior vena cava is dilated in size with <50% respiratory  variability, suggesting right atrial pressure of 15 mmHg.   Epic records are reviewed at length today  Assessment and Plan:  1. Persistent atrial fibrillation Patient's AAD options are limited: did not tolerate Multaq 2/2 GI side effects, class IC contraindicated with CAD, would also avoid amiodarone  given lung disease.  Patient wants to pursue dofetilide, aware of risk vrs benefit Aware of price of dofetilide. Patient will continue Eliquis 5 mg BID, states no missed doses in the last 3 weeks. Will need to watch her weight closely.  No benadryl use PharmD has screened drugs and amlodipine discontinued, Xarelto changed to Eliquis. QTc in SR 438 ms, Labs today show creatinine at 1.07, K+ 3.9 and mag 2.0, CrCl calculated at 40 mL/min. Will have pt take an extra dose of KCl.  Continue diltiazem 180 mg daily and 30 mg PRN for heart racing.   This patients CHA2DS2-VASc Score and unadjusted Ischemic Stroke Rate (% per year) is equal to 7.2 % stroke rate/year from  a score of 5  Above score calculated as 1 point each if present [CHF, HTN, DM, Vascular=MI/PAD/Aortic Plaque, Age if 65-74, or Female] Above score calculated as 2 points each if present [Age > 75, or Stroke/TIA/TE]  2. HTN Stable, no changes today.  3. Obstructive sleep apnea Encouraged compliance with CPAP therapy.   4. CAD s/p CABG No anginal symptoms.   To be admitted later today once a bed becomes available.     [facility] [address] [place],  [address] [phone] 04/23/2020 10:32 AM

## 2020-04-23 NOTE — Progress Notes (Signed)
IVT consulted for PIV.  Pt eating and asked IVT RN to come back.  Pt's RN assessed visible veins and will attempt PIV.  If unsuccessful, RN will put in a new IV consult.

## 2020-04-24 ENCOUNTER — Inpatient Hospital Stay (HOSPITAL_COMMUNITY): Payer: Medicare HMO

## 2020-04-24 DIAGNOSIS — I4891 Unspecified atrial fibrillation: Secondary | ICD-10-CM

## 2020-04-24 DIAGNOSIS — I4819 Other persistent atrial fibrillation: Secondary | ICD-10-CM | POA: Diagnosis not present

## 2020-04-24 LAB — BASIC METABOLIC PANEL
Anion gap: 13 (ref 5–15)
BUN: 14 mg/dL (ref 8–23)
CO2: 27 mmol/L (ref 22–32)
Calcium: 9.4 mg/dL (ref 8.9–10.3)
Chloride: 101 mmol/L (ref 98–111)
Creatinine, Ser: 1.21 mg/dL — ABNORMAL HIGH (ref 0.44–1.00)
GFR, Estimated: 45 mL/min — ABNORMAL LOW (ref >60)
Glucose, Bld: 120 mg/dL — ABNORMAL HIGH (ref 70–99)
Potassium: 4.2 mmol/L (ref 3.5–5.1)
Sodium: 141 mmol/L (ref 135–145)

## 2020-04-24 LAB — MAGNESIUM: Magnesium: 2.6 mg/dL — ABNORMAL HIGH (ref 1.7–2.4)

## 2020-04-24 LAB — ECHOCARDIOGRAM COMPLETE
Area-P 1/2: 3.95 cm2
Height: 62 in
MV M vel: 4.99 m/s
MV Peak grad: 99.4 mmHg
Radius: 0.55 cm
S' Lateral: 2.8 cm
Weight: 2123.47 oz

## 2020-04-24 MED ORDER — IPRATROPIUM BROMIDE 0.02 % IN SOLN: 0.5000 mg | Freq: Four times a day (QID) | RESPIRATORY_TRACT | Status: DC | PRN

## 2020-04-24 MED ORDER — LEVOTHYROXINE SODIUM 50 MCG PO TABS
50.0000 ug | ORAL_TABLET | Freq: Every day | ORAL | Status: DC
  Administered 2020-04-24 – 2020-04-26 (×3): 50 ug via ORAL
  Filled 2020-04-24 (×3): qty 1

## 2020-04-24 MED ORDER — SODIUM CHLORIDE 0.9 % IV SOLN: INTRAVENOUS | Status: AC

## 2020-04-24 MED ORDER — MELATONIN 3 MG PO TABS
3.0000 mg | ORAL_TABLET | Freq: Every day | ORAL | Status: DC
  Administered 2020-04-24 – 2020-04-25 (×2): 3 mg via ORAL
  Filled 2020-04-24 (×2): qty 1

## 2020-04-24 NOTE — Progress Notes (Addendum)
Progress Note  Patient Name: Andrea [name] Date of Encounter: 04/24/2020  [facility] Cardiologist: Dr. Rockey Situ  Subjective   Didn't sleep well, otherwise no complaints  Inpatient Medications    Scheduled Meds:  acetaminophen  1,000 mg Oral BID   apixaban  5 mg Oral BID   clopidogrel  75 mg Oral Daily   diltiazem  240 mg Oral Daily   dofetilide  250 mcg Oral BID   ezetimibe  10 mg Oral Daily   fluticasone furoate-vilanterol  1 puff Inhalation Daily   And   umeclidinium bromide  1 puff Inhalation Daily   furosemide  20 mg Oral Daily   isosorbide mononitrate  60 mg Oral BID   levothyroxine  50 mcg Oral Q0600   pantoprazole  40 mg Oral Daily   potassium chloride  10 mEq Oral Daily   rosuvastatin  40 mg Oral Daily   sodium chloride flush  3 mL Intravenous Q12H   Continuous Infusions:  sodium chloride     sodium chloride     PRN Meds: sodium chloride, albuterol, azelastine, HYDROcodone-acetaminophen, ipratropium, nitroGLYCERIN, polyvinyl alcohol, sodium chloride flush   Vital Signs    Vitals:   04/23/20 1741 04/23/20 2010 04/23/20 2307 04/24/20 0619  BP: (!) 155/87 133/73 (!) 142/66 130/83  Pulse: 86 62 (!) 57 71  Resp: 18 16 18 16   Temp: 98.9 F (37.2 C) 98 F (36.7 C) 98.5 F (36.9 C) 97.8 F (36.6 C)  TempSrc: Oral Oral Oral Oral  SpO2:  96% 96% 97%  Weight: 60.1 kg   60.2 kg  Height: 5\' 2"  (1.575 m)       Intake/Output Summary (Last 24 hours) at 04/24/2020 0806 Last data filed at 04/24/2020 0435 Gross per 24 hour  Intake 231 ml  Output 600 ml  Net -369 ml   Last 3 Weights 04/24/2020 04/23/2020 04/23/2020  Weight (lbs) 132 lb 11.5 oz 132 lb 8 oz 132 lb 8 oz  Weight (kg) 60.2 kg 60.102 kg 60.102 kg      Telemetry    AFib 50's overnight, 60's, early this AM pause 3.6 seconds, just about the time she thinks she probably woke - Personally Reviewed  ECG    This AM is AFib 69bpm, QTc 435ms  - Personally Reviewed with Andrea [name]  Physical Exam    GEN: No acute distress.   Neck: No JVD Cardiac: irreg-irreg, no murmurs, rubs, or gallops.  Respiratory: CTA b/l. GI: Soft, nontender, non-distended  MS: No edema; No deformity. Neuro:  Nonfocal  Psych: Normal affect   Labs    High Sensitivity Troponin:   Recent Labs  Lab 04/10/20 0542 04/10/20 0913  TROPONINIHS 14 15      Chemistry Recent Labs  Lab 04/23/20 0953 04/24/20 0245  NA 141 141  K 3.9 4.2  CL 102 101  CO2 28 27  GLUCOSE 111* 120*  BUN 11 14  CREATININE 1.07* 1.21*  CALCIUM 9.7 9.4  GFRNONAA 52* 45*  ANIONGAP 11 13     HematologyNo results for input(s): WBC, RBC, HGB, HCT, MCV, MCH, MCHC, RDW, PLT in the last 168 hours.  BNPNo results for input(s): BNP, PROBNP in the last 168 hours.   DDimer No results for input(s): DDIMER in the last 168 hours.   Radiology    No results found.  Cardiac Studies   08/16/2019 TTE IMPRESSIONS   1. Left ventricular ejection fraction, by estimation, is 60 to 65%. The  left ventricle has normal function.  The left ventricle has no regional  wall motion abnormalities. Left ventricular diastolic parameters are  indeterminate.   2. Right ventricular systolic function is moderately reduced. The right  ventricular size is moderately enlarged. moderately increased right  ventricular wall thickness. There is moderately elevated pulmonary artery  systolic pressure. The estimated right  ventricular systolic pressure is 65.0 mmHg.   3. Left atrial size was moderately dilated.   4. Right atrial size was moderately dilated.   5. The mitral valve is degenerative. Mild to moderate mitral valve  regurgitation.   6. Tricuspid valve regurgitation is severe.   7. The aortic valve is tricuspid. Aortic valve regurgitation is not  visualized. Mild to moderate aortic valve sclerosis/calcification is  present, without any evidence of aortic stenosis.   8. The inferior vena cava is dilated in size with <50% respiratory  variability,  suggesting right atrial pressure of 15 mmHg.   Patient Profile     79 y.o. female w/PMHx of CAD (CABG 1999, PCIs 2013, 2017), PVD (s/p L CEA 2012), COPD, p.HTN, HTN, HLD, h/o PE, hypothyroidism, and AFib admitted for Tikosyn initiation  Assessment & Plan    1. Persistent Afib     CHA2DS2Vasc is 5, on Eliquis, appropriately dosed     tikosyn load is in progress     K+ 4.2     Mag 2.6     Creat 1.21 Unusually high for her, Andrea [name] gve some fluids, d/w Andrea [name], Andrea [name] conntinue 251mcg today, pendin creat tomorrow after IVF     No post dose ekg done last night     QT looks OK this AM       DCCV tomorrow if not in SR, discussed with the patient, she has had before, we evisited the procedure, potential risks and benefits, she is agreeable  2. CAD 3. PVD     No symptoms     Home meds, on plavix, zetia, nitrate, statin  4. HTN     No changes today  5. Hypothyroidism     Home med  6. COPD     No symptoms     Home regimen PRNs  For questions or updates, please contact [place] Please consult [email or web address] for contact info under        Signed, Andrea Jamaica, PA-C  04/24/2020, 8:06 AM    I have seen and examined this patient with Andrea [name].  Agree with above, note added to reflect my findings.  On exam, RRR, no murmurs.  Patient remains in atrial fibrillation.  Her creatinine is low today and thus we Travius Crochet hydrate at is it is lower than it is usually.  We Jermey Closs continue her current dose of Tikosyn.  Eleisha Branscomb M. Guss Farruggia MD 04/24/2020 8:50 AM

## 2020-04-24 NOTE — Plan of Care (Signed)
Pt for DCCV 12/1. Pt has understanding of procedure. Pt remains in afib at controlled rate

## 2020-04-24 NOTE — Progress Notes (Signed)
EKG not done at 1040 (2hrs post tikosyn) d/t pt having echo done. Carroll Kinds RN

## 2020-04-24 NOTE — TOC Benefit Eligibility Note (Signed)
Transition of Care [facility]) Benefit Eligibility Note    Patient Details  Name: Blondie Riggsbee MRN: [id number] Date of Birth: [birth year]   Medication/Dose: Dofetilide 265mg and or 5041m  Covered?: Yes     Prescription Coverage [facility]  Spoke with Person/Company/Phone Number:: Jayna R. W[employer] PH# [phone]-Pay: $21.73 - $24.19  Prior Approval: No  Deductible: Met       HaShelda Alteshone Number: 04/24/2020, 1:30 PM

## 2020-04-24 NOTE — Progress Notes (Signed)
Pharmacy: Dofetilide (Tikosyn) - Follow Up Assessment and Electrolyte Replacement  Pharmacy consulted to assist in monitoring and replacing electrolytes in this 79 y.o. female admitted on 04/23/2020 undergoing dofetilide initiation.   Labs:    Component Value Date/Time   K 4.2 04/24/2020 0245   MG 2.6 (H) 04/24/2020 0245     Plan: Potassium: K >/= 4: No additional supplementation needed  Magnesium: Mg > 2: No additional supplementation needed  Hildred Laser, PharmD Clinical Pharmacist **Pharmacist phone directory can now be found on [email or web address] ([email or web address]).  Listed under [place].

## 2020-04-24 NOTE — Progress Notes (Signed)
Pt received 3rd dose of Tikosyn this evening. EKG 2hrs post QTc is 467, pt remains in AFIB HR 60's. Pt for DCCV tomorrow if she does not convert-pt understands NPO after MN. Will continue to monitor. Jessie Foot, RN

## 2020-04-24 NOTE — Progress Notes (Signed)
  Echocardiogram 2D Echocardiogram with 3D has been performed.  Andrea [name] M 04/24/2020, 10:59 AM

## 2020-04-25 ENCOUNTER — Encounter (HOSPITAL_COMMUNITY)
Admission: RE | Disposition: A | Discharge status: Home / Self Care | Payer: Self-pay | Source: Ambulatory Visit | Attending: Cardiology

## 2020-04-25 ENCOUNTER — Inpatient Hospital Stay (HOSPITAL_COMMUNITY): Payer: Medicare HMO | Admitting: Anesthesiology

## 2020-04-25 DIAGNOSIS — I4819 Other persistent atrial fibrillation: Secondary | ICD-10-CM | POA: Diagnosis not present

## 2020-04-25 DIAGNOSIS — G4733 Obstructive sleep apnea (adult) (pediatric): Secondary | ICD-10-CM | POA: Diagnosis not present

## 2020-04-25 HISTORY — PX: CARDIOVERSION: SHX1299

## 2020-04-25 LAB — BASIC METABOLIC PANEL
Anion gap: 11 (ref 5–15)
BUN: 19 mg/dL (ref 8–23)
CO2: 27 mmol/L (ref 22–32)
Calcium: 9.3 mg/dL (ref 8.9–10.3)
Chloride: 100 mmol/L (ref 98–111)
Creatinine, Ser: 1.11 mg/dL — ABNORMAL HIGH (ref 0.44–1.00)
GFR, Estimated: 50 mL/min — ABNORMAL LOW (ref >60)
Glucose, Bld: 119 mg/dL — ABNORMAL HIGH (ref 70–99)
Potassium: 3.9 mmol/L (ref 3.5–5.1)
Sodium: 138 mmol/L (ref 135–145)

## 2020-04-25 LAB — MAGNESIUM: Magnesium: 2 mg/dL (ref 1.7–2.4)

## 2020-04-25 SURGERY — CARDIOVERSION
Anesthesia: General

## 2020-04-25 MED ORDER — SODIUM CHLORIDE 0.9 % IV SOLN: INTRAVENOUS | Status: DC | PRN

## 2020-04-25 MED ORDER — DOFETILIDE 125 MCG PO CAPS
125.0000 ug | ORAL_CAPSULE | Freq: Two times a day (BID) | ORAL | Status: DC
  Administered 2020-04-25 – 2020-04-26 (×3): 125 ug via ORAL
  Filled 2020-04-25 (×4): qty 1

## 2020-04-25 MED ORDER — POTASSIUM CHLORIDE CRYS ER 20 MEQ PO TBCR
40.0000 meq | EXTENDED_RELEASE_TABLET | Freq: Once | ORAL | Status: AC
  Administered 2020-04-25: 40 meq via ORAL
  Filled 2020-04-25: qty 2

## 2020-04-25 MED ORDER — PROPOFOL 10 MG/ML IV BOLUS
INTRAVENOUS | Status: DC | PRN
  Administered 2020-04-25: 70 mg via INTRAVENOUS

## 2020-04-25 MED ORDER — IPRATROPIUM-ALBUTEROL 0.5-2.5 (3) MG/3ML IN SOLN: 3.0000 mL | RESPIRATORY_TRACT | Status: DC | PRN

## 2020-04-25 MED ORDER — LIDOCAINE 2% (20 MG/ML) 5 ML SYRINGE
INTRAMUSCULAR | Status: DC | PRN
  Administered 2020-04-25: 60 mg via INTRAVENOUS

## 2020-04-25 NOTE — Progress Notes (Signed)
Pts O2 sat noted to drop to 82. Pt sleeping and easily aroused. Pt without complaints, but states she wears CPAP at home. Order placed for CPAP per home settings -Respiratory notified and will bring to unit shortly. Jessie Foot, RN

## 2020-04-25 NOTE — CV Procedure (Signed)
    Electrical Cardioversion Procedure Note Andrea [name] [id number] [birth year]  Procedure: Electrical Cardioversion Indications:  AFIB  Time Out: Verified patient identification, verified procedure,medications/allergies/relevent history reviewed, required imaging and test results available.  Performed  Procedure Details  The patient was NPO after midnight. Anesthesia was administered at the beside  by Dr. Roel Cluck with  propofol.  Cardioversion was performed with synchronized biphasic defibrillation via AP pads with 120 joules.  1 attempt(s) were performed.  The patient converted to normal sinus rhythm. The patient tolerated the procedure well   IMPRESSION:  Successful cardioversion of atrial fibrillation    Andrea [name] 04/25/2020, 12:53 PM

## 2020-04-25 NOTE — Discharge Summary (Addendum)
ELECTROPHYSIOLOGY PROCEDURE DISCHARGE SUMMARY    [name],  MRN: [id number], DOB/AGE: [birth year] 79 y.o.  Admit date: 04/23/2020 Discharge date: 04/26/2020  Primary Care Physician: Andrea Lukes, MD  Primary Cardiologist: Dr. Rockey [name] Electrophysiologist: Dr. Curt [name]  Primary Discharge Diagnosis:  1.  persistent atrial fibrillation status post Tikosyn loading this admission      CHA2DS2Vasc is 5, on Eliquis appropriately dosed  Secondary Discharge Diagnosis:  1. CAD     (CABG 1999, PCIs 2013, 2017) 2. PVD     (s/p L CEA 2012) 3. COPD 4. P.HTN 5. HTN 6. Hypothyroidism 7. OSA  Allergies  Allergen Reactions   Erythromycin Other (See Comments)    Made the patient's tongue "burn"   Tape Rash and Other (See Comments)    PLEASE USE PAPER TAPE!!   Meperidine Nausea And Vomiting   Shellfish Allergy Nausea And Vomiting   Ciprofloxacin Rash and Other (See Comments)    Rash from IV   Codeine Nausea And Vomiting   Latex Rash and Other (See Comments)    Made the patient's skin "burn," also   Penicillins Rash and Other (See Comments)    Has patient had a PCN reaction causing immediate rash, facial/tongue/throat swelling, SOB or lightheadedness with hypotension: Unk Has patient had a PCN reaction causing severe rash involving mucus membranes or skin necrosis: No Has patient had a PCN reaction that required hospitalization: No Has patient had a PCN reaction occurring within the last 10 years: No If all of the above answers are "NO", then may proceed with Cephalosporin use.      Procedures This Admission:  1.  Tikosyn loading 2.  Direct current cardioversion on 04/25/2020 by Dr Andrea [name] which successfully restored SR.  There were no early apparent complications.   Brief HPI: Andrea [name] is a 79 y.o. female with a past medical history as noted above.  They were referred to EP in the outpatient setting for treatment options of atrial fibrillation.  Risks,  benefits, and alternatives to Tikosyn were reviewed with the patient who wished to proceed.    Hospital Course:  The patient was admitted and Tikosyn was initiated.  Renal function and electrolytes were followed during the hospitalization, her Tikosyn dose reduced 2/2 her renal function.  The patient's QTc remained stable.  On 04/25/2020 the patient underwent direct current cardioversion which restored sinus rhythm.  She was monitored until discharge on telemetry which demonstrated SB/SR 50's-60's, infrequent PVC  She has PRN O2 that she uses at home, she has worn here on/off, currently on RA O2 sat 94-97% On the day of discharge, she feels well, was examined by Dr Andrea [name] who considered the patient stable for discharge to home.  Follow-up has been arranged with the AFib clinic in 1 week and with Dr Andrea [name] in 4 weeks.   Tikosyn teaching was completed She did require intermittent extra potassium to keep >4.0, Andrea [name] increase her daily K+ dose to 50meq on her lasix days and 22meq on off days  Physical Exam: Vitals:   04/25/20 2013 04/26/20 0504 04/26/20 0851 04/26/20 0931  BP: (!) 116/57 (!) 112/53  102/61  Pulse: (!) 56 (!) 54 (!) 58 63  Resp: 18 17 16 16   Temp: 98.9 F (37.2 C) 98.4 F (36.9 C)  98.7 F (37.1 C)  TempSrc: Oral Oral  Oral  SpO2:  96% 91% 94%  Weight:  59.1 kg    Height:         GEN-  The patient is well appearing, alert and oriented x 3 today.   HEENT: normocephalic, atraumatic; sclera clear, conjunctiva pink; hearing intact; oropharynx clear; neck supple, no JVP Lymph- no cervical lymphadenopathy Lungs- CTA b/l, normal work of breathing.  No wheezes, rales, rhonchi Heart- RRR, no murmurs, rubs or gallops, PMI not laterally displaced GI- soft, non-tender, non-distended Extremities- no clubbing, cyanosis, or edema MS- no significant deformity or atrophy Skin- warm and dry, no rash or lesion Psych- euthymic mood, full affect Neuro- strength and sensation are  intact   Labs:   Lab Results  Component Value Date   WBC 8.5 04/10/2020   HGB 10.7 (L) 04/10/2020   HCT 35.6 (L) 04/10/2020   MCV 82.6 04/10/2020   PLT 146 (L) 04/10/2020    Recent Labs  Lab 04/26/20 0135  NA 139  K 4.0  CL 101  CO2 27  BUN 18  CREATININE 1.07*  CALCIUM 9.4  GLUCOSE 110*     Discharge Medications:  Allergies as of 04/26/2020       Reactions   Erythromycin Other (See Comments)   Made the patient's tongue "burn"   Tape Rash, Other (See Comments)   PLEASE USE PAPER TAPE!!   Meperidine Nausea And Vomiting   Shellfish Allergy Nausea And Vomiting   Ciprofloxacin Rash, Other (See Comments)   Rash from IV   Codeine Nausea And Vomiting   Latex Rash, Other (See Comments)   Made the patient's skin "burn," also   Penicillins Rash, Other (See Comments)   Has patient had a PCN reaction causing immediate rash, facial/tongue/throat swelling, SOB or lightheadedness with hypotension: Unk Has patient had a PCN reaction causing severe rash involving mucus membranes or skin necrosis: No Has patient had a PCN reaction that required hospitalization: No Has patient had a PCN reaction occurring within the last 10 years: No If all of the above answers are "NO", then may proceed with Cephalosporin use.        Medication List     STOP taking these medications    amLODipine 5 MG tablet Commonly known as: NORVASC       TAKE these medications    acetaminophen 650 MG CR tablet Commonly known as: TYLENOL Take 1,300 mg by mouth 2 (two) times daily.   albuterol (2.5 MG/3ML) 0.083% nebulizer solution Commonly known as: PROVENTIL Take 3 mLs (2.5 mg total) by nebulization every 4 (four) hours as needed for wheezing or shortness of breath. Dx:J44.9 What changed: Another medication with the same name was changed. Make sure you understand how and when to take each. Notes to patient: Please confirm with the prescribing doctor dose with duplicate prescription with  different dose noted    albuterol 108 (90 Base) MCG/ACT inhaler Commonly known as: VENTOLIN HFA INHALE 2 PUFFS INTO THE LUNGS EVERY 6 (SIX) HOURS AS NEEDED FOR WHEEZING. What changed:  how much to take how to take this when to take this reasons to take this additional instructions   albuterol (5 MG/ML) 0.5% nebulizer solution Commonly known as: PROVENTIL Take 0.5 mLs (2.5 mg total) by nebulization every 6 (six) hours as needed for wheezing or shortness of breath. What changed: Another medication with the same name was changed. Make sure you understand how and when to take each. Notes to patient: Please confirm with the prescribing doctor dose with duplicate prescription with different dose noted    apixaban 5 MG Tabs tablet Commonly known as: Eliquis Take 1 tablet (5 mg total) by mouth 2 (  two) times daily.   azelastine 0.1 % nasal spray Commonly known as: ASTELIN Place 2 sprays into both nostrils at bedtime as needed for rhinitis.   Biotin 5 MG Tabs Take 5 mg by mouth daily.   CALCIUM + D PO Take 1 tablet by mouth 2 (two) times a week.   clopidogrel 75 MG tablet Commonly known as: PLAVIX TAKE 1 TABLET BY MOUTH ONCE A DAY What changed: when to take this   diltiazem 240 MG 24 hr capsule Commonly known as: CARDIZEM CD Take 1 capsule (240 mg total) by mouth daily.   diltiazem 30 MG tablet Commonly known as: Cardizem Take one tablet by mouth every 4 hours AS NEEDED for A-fib HR > 100 as long as BP > 100. What changed:  how much to take how to take this when to take this reasons to take this additional instructions   dofetilide 125 MCG capsule Commonly known as: TIKOSYN Take 1 capsule (125 mcg total) by mouth 2 (two) times daily.   ezetimibe 10 MG tablet Commonly known as: ZETIA TAKE 1 TABLET BY MOUTH DAILY What changed: when to take this   furosemide 20 MG tablet Commonly known as: LASIX Take 1 tablet (20 mg total) by mouth as directed. Take 1 tablet (20 mg)  daily and extra 1 tablet (20 mg) at 2 PM for shortness of breath or abdominal swelling What changed: additional instructions   HYDROcodone-acetaminophen 10-325 MG tablet Commonly known as: NORCO Take 1 tablet by mouth every 6 (six) hours as needed for severe pain. Notes to patient: This appears to be a completed prescription, please confirm with original prescribing physician/doctor for this medicine   ipratropium 0.02 % nebulizer solution Commonly known as: ATROVENT Take 2.5 mLs (0.5 mg total) by nebulization 4 (four) times daily.   isosorbide mononitrate 60 MG 24 hr tablet Commonly known as: IMDUR TAKE 1 TABLET BY MOUTH TWICE A DAY   Krill Oil 500 MG Caps Take 500 mg by mouth daily.   levothyroxine 50 MCG tablet Commonly known as: SYNTHROID TAKE 1 TABLET BY MOUTH ONCE DAILY What changed: when to take this   neomycin-polymyxin b-dexamethasone 3.5-10000-0.1 Susp Commonly known as: MAXITROL Place 1 drop into both eyes every 6 (six) hours.   nitroGLYCERIN 0.4 MG SL tablet Commonly known as: NITROSTAT Place 1 tablet (0.4 mg total) under the tongue every 5 (five) minutes as needed for chest pain.   omeprazole 20 MG capsule Commonly known as: PRILOSEC Take 1 capsule (20 mg total) by mouth daily.   potassium chloride 10 MEQ tablet Commonly known as: KLOR-CON Take 1 tablet (10 mEq total) by mouth daily. And take 2 tablets (84meq) on days you take furosemide What changed: additional instructions Notes to patient: Please note change to dosing as we discussed   rosuvastatin 40 MG tablet Commonly known as: CRESTOR TAKE 1 TABLET BY MOUTH ONCE DAILY What changed: when to take this   sertraline 100 MG tablet Commonly known as: ZOLOFT Take 1 tablet (100 mg total) by mouth daily.   SYSTANE OP Place 1 drop into both eyes 2 (two) times daily as needed (dry/irritated eyes.).   Trelegy Ellipta 100-62.5-25 MCG/INH Aepb Generic drug: Fluticasone-Umeclidin-Vilant Inhale 1 puff into  the lungs daily.   Vitamin D (Ergocalciferol) 1.25 MG (50000 UNIT) Caps capsule Commonly known as: DRISDOL TAKE 1 CAPSULE (50,000 UNITS) BY MOUTH ONCE A WEEK. What changed: See the new instructions.   Vitamin D3 50 MCG (2000 UT) Tabs Take 2,000 Units  by mouth daily.   zinc gluconate 50 MG tablet Take 50 mg by mouth daily.        Disposition: Home Discharge Instructions     Diet - low sodium heart healthy   Complete by: As directed    Increase activity slowly   Complete by: As directed        Follow-up Information     [facility] Follow up.   Specialty: Cardiology Why: 05/03/2020 @ 11:30AM with R. Marlene Lard, [place] Contact information: [address] [id number] mc [address] [place] [phone]        Constance Haw, MD Follow up.   Specialty: Cardiology Why: 05/24/2020 @ 4:30PM Contact information: [address] [place] [address] [phone]                 Duration of Discharge Encounter: Greater than 30 minutes including physician time.  SignedTommye Standard, PA-C 04/26/2020 11:55 AM  I have seen and examined this patient with Tommye Standard.  Agree with above, note added to reflect my findings.  On exam, RRR, no murmurs, lungs clear. Admit for tikosyn load. QTc remained stable on 125 mcg. Plan for discharge with follow up in clinic.    Miho Monda M. Tinzley Dalia MD 04/27/2020 7:38 AM

## 2020-04-25 NOTE — Anesthesia Procedure Notes (Signed)
Procedure Name: General with mask airway Date/Time: 04/25/2020 12:54 PM Performed by: Imagene Riches, CRNA Pre-anesthesia Checklist: Patient identified, Emergency Drugs available, Suction available, Patient being monitored and Timeout performed Patient Re-evaluated:Patient Re-evaluated prior to induction Oxygen Delivery Method: Ambu bag

## 2020-04-25 NOTE — Anesthesia Preprocedure Evaluation (Addendum)
Anesthesia Evaluation  Patient identified by MRN, date of birth, ID band Patient awake    Reviewed: Patient's Chart, lab work & pertinent test results  Airway Mallampati: II  TM Distance: >3 FB Neck ROM: Full    Dental  (+) Upper Dentures, Lower Dentures   Pulmonary asthma , sleep apnea , COPD, former smoker,    Pulmonary exam normal        Cardiovascular hypertension, Pt. on medications + CAD, + Past MI, + Cardiac Stents, + CABG and + Peripheral Vascular Disease  + dysrhythmias Atrial Fibrillation + Cardiac Defibrillator  Rhythm:Irregular Rate:Normal  CABG 1999, PCI DES 2013/2017   Neuro/Psych Depression negative neurological ROS     GI/Hepatic Neg liver ROS, GERD  Medicated,  Endo/Other  Hypothyroidism   Renal/GU   negative genitourinary   Musculoskeletal  (+) Arthritis , Osteoarthritis,    Abdominal (+)  Abdomen: soft. Bowel sounds: normal.  Peds  Hematology negative hematology ROS (+)   Anesthesia Other Findings   Reproductive/Obstetrics                            Anesthesia Physical Anesthesia Plan  ASA: III  Anesthesia Plan: General   Post-op Pain Management:    Induction: Intravenous  PONV Risk Score and Plan: 3  Airway Management Planned: Mask  Additional Equipment: None  Intra-op Plan:   Post-operative Plan:   Informed Consent: I have reviewed the patients History and Physical, chart, labs and discussed the procedure including the risks, benefits and alternatives for the proposed anesthesia with the patient or authorized representative who has indicated his/her understanding and acceptance.     Dental advisory given  Plan Discussed with: CRNA  Anesthesia Plan Comments: (ECHO 04/24/20: 1. Left ventricular ejection fraction, by estimation, is 55 to 60%. The  left ventricle has normal function. The left ventricle has no regional  wall motion abnormalities.  Left ventricular diastolic function could not  be evaluated.  2. Right ventricular systolic function is normal. The right ventricular  size is mildly enlarged. There is normal pulmonary artery systolic  pressure.  3. Left atrial size was moderately dilated.  4. Right atrial size was severely dilated.  5. The mitral valve is degenerative. Mild to moderate mitral valve  regurgitation. No evidence of mitral stenosis.  6. Tricuspid valve regurgitation is severe.  7. The aortic valve is tricuspid. There is mild calcification of the  aortic valve. There is mild thickening of the aortic valve. Aortic valve  regurgitation is trivial. Mild to moderate aortic valve  sclerosis/calcification is present, without any  evidence of aortic stenosis.  8. The inferior vena cava is normal in size with greater than 50%  respiratory variability, suggesting right atrial pressure of 3 mmHg. )        Anesthesia Quick Evaluation

## 2020-04-25 NOTE — Transfer of Care (Signed)
Immediate Anesthesia Transfer of Care Note  Patient: Andrea [name]  Procedure(s) Performed: CARDIOVERSION (N/A )  Patient Location: Endoscopy Unit  Anesthesia Type:General  Level of Consciousness: drowsy  Airway & Oxygen Therapy: Patient Spontanous Breathing  Post-op Assessment: Report given to RN and Post -op Vital signs reviewed and stable  Post vital signs: Reviewed and stable  Last Vitals:  Vitals Value Taken Time  BP    Temp    Pulse    Resp    SpO2      Last Pain:  Vitals:   04/25/20 1200  TempSrc: Oral  PainSc: 0-No pain      Patients Stated Pain Goal: 0 (65/79/03 8333)  Complications: No complications documented.

## 2020-04-25 NOTE — Progress Notes (Addendum)
Progress Note  Patient Name: Andrea [name] Date of Encounter: 04/25/2020  [facility] Cardiologist: Dr. Rockey Situ  Subjective   No complaints  Inpatient Medications    Scheduled Meds: . acetaminophen  1,000 mg Oral BID  . apixaban  5 mg Oral BID  . clopidogrel  75 mg Oral Daily  . diltiazem  240 mg Oral Daily  . dofetilide  250 mcg Oral BID  . ezetimibe  10 mg Oral Daily  . fluticasone furoate-vilanterol  1 puff Inhalation Daily   And  . umeclidinium bromide  1 puff Inhalation Daily  . furosemide  20 mg Oral Daily  . isosorbide mononitrate  60 mg Oral BID  . levothyroxine  50 mcg Oral Q0600  . melatonin  3 mg Oral QHS  . pantoprazole  40 mg Oral Daily  . potassium chloride  10 mEq Oral Daily  . potassium chloride  40 mEq Oral Once  . rosuvastatin  40 mg Oral Daily  . sodium chloride flush  3 mL Intravenous Q12H   Continuous Infusions: . sodium chloride     PRN Meds: sodium chloride, azelastine, HYDROcodone-acetaminophen, ipratropium-albuterol, nitroGLYCERIN, polyvinyl alcohol, sodium chloride flush   Vital Signs    Vitals:   04/24/20 0619 04/24/20 1522 04/25/20 0300 04/25/20 0550  BP: 130/83 (!) 113/54  117/77  Pulse: 71 60 61 64  Resp: 16 15 16 18   Temp: 97.8 F (36.6 C) 98.7 F (37.1 C)  (!) 97.3 F (36.3 C)  TempSrc: Oral Oral    SpO2: 97% 97% 92% 94%  Weight: 60.2 kg     Height:       No intake or output data in the 24 hours ending 04/25/20 0720 Last 3 Weights 04/24/2020 04/23/2020 04/23/2020  Weight (lbs) 132 lb 11.5 oz 132 lb 8 oz 132 lb 8 oz  Weight (kg) 60.2 kg 60.102 kg 60.102 kg      Telemetry    AFib 50's overnight, 60's - Personally Reviewed  ECG    AFib 89bpm, QTc 472ms  - Personally Reviewed with Dr. Curt Bears  Physical Exam   Exam is unchanged GEN: No acute distress.   Neck: No JVD Cardiac: irreg-irreg, no murmurs, rubs, or gallops.  Respiratory: CTA b/l. GI: Soft, nontender, non-distended  MS: No edema; No  deformity. Neuro:  Nonfocal  Psych: Normal affect   Labs    High Sensitivity Troponin:   Recent Labs  Lab 04/10/20 0542 04/10/20 0913  TROPONINIHS 14 15      Chemistry Recent Labs  Lab 04/23/20 0953 04/24/20 0245 04/25/20 0341  NA 141 141 138  K 3.9 4.2 3.9  CL 102 101 100  CO2 28 27 27   GLUCOSE 111* 120* 119*  BUN 11 14 19   CREATININE 1.07* 1.21* 1.11*  CALCIUM 9.7 9.4 9.3  GFRNONAA 52* 45* 50*  ANIONGAP 11 13 11      HematologyNo results for input(s): WBC, RBC, HGB, HCT, MCV, MCH, MCHC, RDW, PLT in the last 168 hours.  BNPNo results for input(s): BNP, PROBNP in the last 168 hours.   DDimer No results for input(s): DDIMER in the last 168 hours.   Radiology      Cardiac Studies    04/24/2020 IMPRESSIONS   1. Left ventricular ejection fraction, by estimation, is 55 to 60%. The  left ventricle has normal function. The left ventricle has no regional  wall motion abnormalities. Left ventricular diastolic function could not  be evaluated.   2. Right ventricular systolic function is normal.  The right ventricular  size is mildly enlarged. There is normal pulmonary artery systolic  pressure.   3. Left atrial size was moderately dilated.   4. Right atrial size was severely dilated.   5. The mitral valve is degenerative. Mild to moderate mitral valve  regurgitation. No evidence of mitral stenosis.   6. Tricuspid valve regurgitation is severe.   7. The aortic valve is tricuspid. There is mild calcification of the  aortic valve. There is mild thickening of the aortic valve. Aortic valve  regurgitation is trivial. Mild to moderate aortic valve  sclerosis/calcification is present, without any  evidence of aortic stenosis.   8. The inferior vena cava is normal in size with greater than 50%  respiratory variability, suggesting right atrial pressure of 3 mmHg.   Comparison(s): No significant change from prior study.   08/16/2019 TTE IMPRESSIONS   1. Left  ventricular ejection fraction, by estimation, is 60 to 65%. The  left ventricle has normal function. The left ventricle has no regional  wall motion abnormalities. Left ventricular diastolic parameters are  indeterminate.   2. Right ventricular systolic function is moderately reduced. The right  ventricular size is moderately enlarged. moderately increased right  ventricular wall thickness. There is moderately elevated pulmonary artery  systolic pressure. The estimated right  ventricular systolic pressure is 69.6 mmHg.   3. Left atrial size was moderately dilated.   4. Right atrial size was moderately dilated.   5. The mitral valve is degenerative. Mild to moderate mitral valve  regurgitation.   6. Tricuspid valve regurgitation is severe.   7. The aortic valve is tricuspid. Aortic valve regurgitation is not  visualized. Mild to moderate aortic valve sclerosis/calcification is  present, without any evidence of aortic stenosis.   8. The inferior vena cava is dilated in size with <50% respiratory  variability, suggesting right atrial pressure of 15 mmHg.   Patient Profile     79 y.o. female w/PMHx of CAD (CABG 1999, PCIs 2013, 2017), PVD (s/p L CEA 2012), COPD, p.HTN, HTN, HLD, h/o PE, hypothyroidism, and AFib admitted for Tikosyn initiation  Assessment & Plan    1. Persistent Afib     CHA2DS2Vasc is 5, on Eliquis, appropriately dosed     tikosyn load is in progress     K+ 3.9, replaced     Mag 2.0     Creat 1.11 Cal CrCl is 38, Decari Duggar reduce dose to 146mcg today     QTc stable      DCCV today  Anticipate discharge tomorrow  2. CAD 3. PVD     No symptoms     Home meds, on plavix, zetia, nitrate, statin  4. HTN     No changes today  5. Hypothyroidism     Home med  6. COPD     No symptoms     Home regimen PRNs   7. OSA     CPAP ordered last night   For questions or updates, please contact [place] Please consult [email or web address] for contact info under         Signed, Baldwin [place], PA-C  04/25/2020, 7:20 AM    I have seen and examined this patient with Tommye Standard.  Agree with above, note added to reflect my findings.  On exam, irregular, lungs clear. Continues to be in atrial fibrillation. QTc remains stable but creatinine decreased so Marigold Mom dose adjust. Plan for cardioversion today.  The patient has grade 1 diastolic dysfunction and is on  lasix.  Anaiz Qazi M. Antawn Sison MD 04/25/2020 7:52 AM

## 2020-04-25 NOTE — Progress Notes (Signed)
Pharmacy: Dofetilide (Tikosyn) - Follow Up Assessment and Electrolyte Replacement  Pharmacy consulted to assist in monitoring and replacing electrolytes in this 79 y.o. female admitted on 04/23/2020 undergoing dofetilide initiation.   Labs:    Component Value Date/Time   K 3.9 04/25/2020 0341   MG 2.0 04/25/2020 0341     Plan: Potassium: -69meq Kdur per MD  Magnesium: -No replacement due to renal function   As patient has required on average ~ 66mEq of potassium replacement every day, recommend discharging patient with prescription for:  Potassium chloride  63mEq  daily  Thank you for allowing pharmacy to participate in this patient's care   Hildred Laser, PharmD Clinical Pharmacist **Pharmacist phone directory can now be found on [email or web address] ([email or web address]).  Listed under [place].

## 2020-04-25 NOTE — Care Management (Signed)
7034 04-25-20 Case Manager spoke with patient and she uses [facility] and wants Rx sent to them with refills. Case Manager called [facility] and they will order 125 mcg to have in stock on 04-26-20. Graves-Bigelow, Ocie Cornfield, RN, BSN Case Manager

## 2020-04-25 NOTE — Plan of Care (Signed)
  Problem: Clinical Measurements: Goal: Ability to maintain clinical measurements within normal limits will improve Outcome: Progressing   Problem: Activity: Goal: Risk for activity intolerance will decrease Outcome: Progressing   Problem: Education: Goal: Knowledge of disease or condition will improve Outcome: Progressing   Problem: Health Behavior/Discharge Planning: Goal: Ability to manage health-related needs will improve Outcome: Completed/Met   Problem: Pain Managment: Goal: General experience of comfort will improve Outcome: Completed/Met   Problem: Education: Goal: Understanding of medication regimen will improve Outcome: Completed/Met

## 2020-04-25 NOTE — Anesthesia Postprocedure Evaluation (Signed)
Anesthesia Post Note  Patient: Andrea [name]  Procedure(s) Performed: CARDIOVERSION (N/A )     Patient location during evaluation: Endoscopy Anesthesia Type: General Level of consciousness: awake and alert Pain management: pain level controlled Vital Signs Assessment: post-procedure vital signs reviewed and stable Respiratory status: spontaneous breathing, nonlabored ventilation, respiratory function stable and patient connected to nasal cannula oxygen Cardiovascular status: blood pressure returned to baseline and stable Postop Assessment: no apparent nausea or vomiting Anesthetic complications: no   No complications documented.  Last Vitals:  Vitals:   04/25/20 1306 04/25/20 1316  BP: 120/68 124/64  Pulse: 61 61  Resp: 17 (!) 21  Temp:    SpO2: 91% 91%    Last Pain:  Vitals:   04/25/20 1316  TempSrc:   PainSc: 0-No pain                 Belenda Cruise P Nachman Sundt

## 2020-04-26 ENCOUNTER — Encounter (HOSPITAL_COMMUNITY): Payer: Self-pay | Admitting: Cardiology

## 2020-04-26 DIAGNOSIS — I4819 Other persistent atrial fibrillation: Secondary | ICD-10-CM | POA: Diagnosis not present

## 2020-04-26 LAB — BASIC METABOLIC PANEL
Anion gap: 11 (ref 5–15)
BUN: 18 mg/dL (ref 8–23)
CO2: 27 mmol/L (ref 22–32)
Calcium: 9.4 mg/dL (ref 8.9–10.3)
Chloride: 101 mmol/L (ref 98–111)
Creatinine, Ser: 1.07 mg/dL — ABNORMAL HIGH (ref 0.44–1.00)
GFR, Estimated: 52 mL/min — ABNORMAL LOW (ref >60)
Glucose, Bld: 110 mg/dL — ABNORMAL HIGH (ref 70–99)
Potassium: 4 mmol/L (ref 3.5–5.1)
Sodium: 139 mmol/L (ref 135–145)

## 2020-04-26 LAB — MAGNESIUM: Magnesium: 2.1 mg/dL (ref 1.7–2.4)

## 2020-04-26 MED ORDER — POTASSIUM CHLORIDE ER 10 MEQ PO TBCR: 10.0000 meq | EXTENDED_RELEASE_TABLET | Freq: Every day | ORAL | 2 refills | Status: DC

## 2020-04-26 MED ORDER — DOFETILIDE 125 MCG PO CAPS: 125.0000 ug | ORAL_CAPSULE | Freq: Two times a day (BID) | ORAL | 6 refills | Status: DC

## 2020-04-26 NOTE — Progress Notes (Signed)
Discharge instructions (including medications) discussed with and copy provided to patient/caregiver 

## 2020-05-02 NOTE — Progress Notes (Signed)
Primary Care Physician: Mosie Lukes, MD Primary Cardiologist: Dr Rockey Situ Primary Electrophysiologist: Dr Curt Bears Referring Physician: [facility]   Andrea [name] is a 79 y.o. female with a history ofcoronary disease status post CABG in 1999 status post PCI to the distal RCA in 2013, PCI x2 to the SVG to the RCA into the mid circumflex in 2017, PVD status post lower extremity stenting, carotid artery disease status post left-sided CEA, pulmonary hypertension, valvular heart disease, COPD, infrarenal AAA, PE, hypertension, hyperlipidemia, asthma, hypothyroidism, SVT/atrial tach, and persistent atrial fibrillation who presents for follow up in the [facility].  The patient was initially diagnosed with atrial fibrillation on 11/07/18 after presenting to the ER with symptoms of palpitations, dyspnea, and chest discomfort. At the ER, she was found to be in rate controlled afib. She was started on Xarelto. Patient was hospitalized for a COPD exacerbation 07/2019 and was found to be in afib at that time. Echo 08/16/19 showed preserved EF with mod dilated LA. She saw Dr Curt Bears on 09/22/19 and DCCV was recommended. Patient is s/p DCCV 10/19/19. After the procedure, she reports that she did not feel improved in SR. She saw Dr Curt Bears 02/16/20 and complained of increased fatigue, weakness, and edema. These were felt to be 2/2 her afib despite rate control. She was seen at the ED 04/10/20 for CP and was treated for suspected COPD exacerbation.   On follow up today, patient is s/p dofetilide loading 11/29-12/2/21 with DCCV on 04/25/20. Patient reports that she feels improved in SR with less fatigue. She denies any bleeding issues on anticoagulation. She is tolerating the dofetilide without issue.   Today, she denies symptoms of palpitations, chest pain, orthopnea, PND, dizziness, presyncope, syncope, bleeding, or neurologic sequela. The patient is tolerating medications without  difficulties and is otherwise without complaint today.    Atrial Fibrillation Risk Factors:  she does have symptoms or diagnosis of sleep apnea. she does not have a history of rheumatic fever. she does not have a history of alcohol use. The patient does not have a history of early familial atrial fibrillation or other arrhythmias.  she has a BMI of Body mass index is 24.69 kg/m.Marland Kitchen Filed Weights   05/03/20 1135  Weight: 61.2 kg    Family History  Problem Relation Age of Onset  . Heart attack Mother 55  . Diabetes Mother   . Colon cancer Father 40  . Lung cancer Father        smoked  . Heart disease Father   . Hypertension Father   . Angina Father   . Lung cancer Sister        smoked  . Hypothyroidism Sister   . Hypertension Brother   . Heart attack Brother   . Heart disease Brother        PTCA with Stent  . Heart attack Brother        CABG  . Heart disease Brother        CABG with 1 bypass  . Aneurysm Brother        brain  . Alcohol abuse Brother   . Hyperlipidemia Daughter   . Diabetes Maternal Grandmother   . Stroke Maternal Grandmother   . Cirrhosis Maternal Grandfather   . Stroke Paternal Grandmother   . Obesity Daughter   . Obesity Son      Atrial Fibrillation Management history:  Previous antiarrhythmic drugs: Multaq (stopped 2/2 side effects), dofetilide Previous cardioversions: 10/19/19, 04/25/20 Previous  ablations: EP study 03/19/18 (tachy not inducible. SVT) CHADS2VASC score: 5 Anticoagulation history: Xarelto, Eliquis    Past Medical History:  Diagnosis Date  . AAA (abdominal aortic aneurysm) ([place]) 01/2009   AAA (2.8 x 3.0)  moderate RAS (left); 2.7 x 2.7 cm (07/11/05)  . Acute bronchitis 03/20/2013; 2017  . Angina   . Anosmia   . Asthma   . Baker's cyst of knee 01/30/2014  . Basal cell carcinoma    "back and left arm"  . Bradycardia    Metoprolol stopped 08/2011  . CAD (coronary artery disease)    s/CABG (reports IMA and 2 SVGs) back in  1999  Myoview normal 3/10; s/p PCI with DES to PL branch of distal RCA 09/2011; PCI +DES to SVG-RCA, PCI + DES to mid LCx 12/2015  . Cerebrovascular disease 01/2009   carotid u/s  R 0-39%   L 60-79%  . Chronic thoracic back pain   . COPD (chronic obstructive pulmonary disease) ([place])   . Depression   . Dizziness   . Dysrhythmia    hx of sinus brady  . GERD (gastroesophageal reflux disease)   . Heart murmur   . Herniated lumbar disc without myelopathy   . History of blood transfusion 1999   "when I had the bypass; had a PE"  . Hyperglycemia 10/23/2015  . Hyperlipidemia   . Hypertension   . Hypothyroidism 09/30/2016  . Medicare annual wellness visit, subsequent 07/31/2013  . NSTEMI (non-ST elevated myocardial infarction) ([place]) 11/12   Cath showed atretic IMA graft to the LAD, SVG to PD was patent but the continuation of this graft to the PL branch was occluded; there were L to R collaterals; Mid LAD had a 60 to 70% stenosis. She has been treated medically.  Neg Myoview 05/2011  . Osteoarthritis of back   . Osteoporosis   . Pneumonia "several times"  . Pulmonary embolism ([place]) 1999   "after my bypass"  . PVD (peripheral vascular disease) ([place])   . Shortness of breath   . Squamous carcinoma    "nose"  . Thyroid disease    Hypothyroid  . Unstable angina ([place]) 09/25/2015   Past Surgical History:  Procedure Laterality Date  . ABDOMINAL AORTIC ANEURYSM REPAIR     pt denies this hx on 01/02/2016  . BASAL CELL CARCINOMA EXCISION    . CARDIAC CATHETERIZATION N/A 01/02/2016   Procedure: Left Heart Cath and Coronary Angiography;  Surgeon: Wellington Hampshire, MD;  Location: [facility];  Service: Cardiovascular;  Laterality: N/A;  . CARDIAC CATHETERIZATION  1996; 1999  . CARDIAC CATHETERIZATION N/A 06/25/2016   Procedure: Left Heart Cath and Cors/Grafts Angiography;  Surgeon: Wellington Hampshire, MD;  Location: [facility];  Service: Cardiovascular;  Laterality: N/A;  . CARDIOVERSION N/A  10/19/2019   Procedure: CARDIOVERSION;  Surgeon: Jerline Pain, MD;  Location: [facility] ENDOSCOPY;  Service: Cardiovascular;  Laterality: N/A;  . CARDIOVERSION N/A 04/25/2020   Procedure: CARDIOVERSION;  Surgeon: Jerline Pain, MD;  Location: [facility] ENDOSCOPY;  Service: Cardiovascular;  Laterality: N/A;  . CAROTID ENDARTERECTOMY Left 01/02/2011  . CATARACT EXTRACTION W/ INTRAOCULAR LENS  IMPLANT, BILATERAL    . CLOSED REDUCTION NASAL FRACTURE  11/2007  . CORONARY ANGIOPLASTY WITH STENT PLACEMENT  10/10/2011   drug eluting  to rc & saphenous  . CORONARY ARTERY BYPASS GRAFT  1999   "CABG X3"  . DILATION AND CURETTAGE OF UTERUS    . FEMORAL ARTERY STENT Bilateral   .  FRACTURE SURGERY    . LEFT HEART CATHETERIZATION WITH CORONARY/GRAFT ANGIOGRAM N/A 04/22/2011   Procedure: LEFT HEART CATHETERIZATION WITH Beatrix Fetters;  Surgeon: Jolaine Artist, MD;  Location: [facility] CATH LAB;  Service: Cardiovascular;  Laterality: N/A;  . LEFT HEART CATHETERIZATION WITH CORONARY/GRAFT ANGIOGRAM N/A 10/10/2011   Procedure: LEFT HEART CATHETERIZATION WITH Beatrix Fetters;  Surgeon: Peter M Martinique, MD;  Location: [facility] CATH LAB;  Service: Cardiovascular;  Laterality: N/A;  . NASAL SINUS SURGERY     twice  . SQUAMOUS CELL CARCINOMA EXCISION    . SVT ABLATION N/A 03/19/2018   Procedure: SVT ABLATION;  Surgeon: Constance Haw, MD;  Location: [facility];  Service: Cardiovascular;  Laterality: N/A;  . TUBAL LIGATION      Current Outpatient Medications  Medication Sig Dispense Refill  . acetaminophen (TYLENOL) 650 MG CR tablet Take 1,300 mg by mouth 2 (two) times daily.    Marland Kitchen albuterol (PROVENTIL) (2.5 MG/3ML) 0.083% nebulizer solution Take 3 mLs (2.5 mg total) by nebulization every 4 (four) hours as needed for wheezing or shortness of breath. Dx:J44.9 75 mL 12  . albuterol (PROVENTIL) (5 MG/ML) 0.5% nebulizer solution Take 0.5 mLs (2.5 mg total) by nebulization every 6 (six) hours as needed for  wheezing or shortness of breath. 20 mL 2  . albuterol (VENTOLIN HFA) 108 (90 Base) MCG/ACT inhaler INHALE 2 PUFFS INTO THE LUNGS EVERY 6 (SIX) HOURS AS NEEDED FOR WHEEZING. 18 g 5  . apixaban (ELIQUIS) 5 MG TABS tablet Take 1 tablet (5 mg total) by mouth 2 (two) times daily. 60 tablet 3  . azelastine (ASTELIN) 0.1 % nasal spray Place 2 sprays into both nostrils at bedtime as needed for rhinitis. 30 mL 3  . Biotin 5 MG TABS Take 5 mg by mouth daily.     . Calcium Carbonate-Vitamin D (CALCIUM + D PO) Take 1 tablet by mouth 2 (two) times a week.     . Cholecalciferol (VITAMIN D3) 2000 units TABS Take 2,000 Units by mouth daily.     . clopidogrel (PLAVIX) 75 MG tablet TAKE 1 TABLET BY MOUTH ONCE A DAY 90 tablet 1  . diltiazem (CARDIZEM CD) 240 MG 24 hr capsule Take 1 capsule (240 mg total) by mouth daily. 90 capsule 2  . diltiazem (CARDIZEM) 30 MG tablet Take one tablet by mouth every 4 hours AS NEEDED for A-fib HR > 100 as long as BP > 100. 45 tablet 0  . dofetilide (TIKOSYN) 125 MCG capsule Take 1 capsule (125 mcg total) by mouth 2 (two) times daily. 60 capsule 6  . ezetimibe (ZETIA) 10 MG tablet TAKE 1 TABLET BY MOUTH DAILY 90 tablet 1  . Fluticasone-Umeclidin-Vilant (TRELEGY ELLIPTA) 100-62.5-25 MCG/INH AEPB Inhale 1 puff into the lungs daily. 60 each 5  . furosemide (LASIX) 20 MG tablet Take 1 tablet (20 mg total) by mouth as directed. Take 1 tablet (20 mg) daily and extra 1 tablet (20 mg) at 2 PM for shortness of breath or abdominal swelling 180 tablet 2  . HYDROcodone-acetaminophen (NORCO) 10-325 MG tablet Take 1 tablet by mouth every 6 (six) hours as needed for severe pain.     Marland Kitchen ipratropium (ATROVENT) 0.02 % nebulizer solution Take 2.5 mLs (0.5 mg total) by nebulization 4 (four) times daily. 75 mL 2  . isosorbide mononitrate (IMDUR) 60 MG 24 hr tablet TAKE 1 TABLET BY MOUTH TWICE A DAY 180 tablet 3  . Krill Oil 500 MG CAPS Take 500 mg by  mouth daily.     Marland Kitchen levothyroxine (SYNTHROID) 50 MCG  tablet TAKE 1 TABLET BY MOUTH ONCE DAILY 90 tablet 3  . neomycin-polymyxin b-dexamethasone (MAXITROL) 3.5-10000-0.1 SUSP Place 1 drop into both eyes every 6 (six) hours.     . nitroGLYCERIN (NITROSTAT) 0.4 MG SL tablet Place 1 tablet (0.4 mg total) under the tongue every 5 (five) minutes as needed for chest pain. 25 tablet 0  . omeprazole (PRILOSEC) 20 MG capsule Take 1 capsule (20 mg total) by mouth daily. 90 capsule 3  . Polyethyl Glycol-Propyl Glycol (SYSTANE OP) Place 1 drop into both eyes 2 (two) times daily as needed (dry/irritated eyes.).     Marland Kitchen potassium chloride (KLOR-CON) 10 MEQ tablet Take 1 tablet (10 mEq total) by mouth daily. And take 2 tablets (16meq) on days you take furosemide 135 tablet 2  . rosuvastatin (CRESTOR) 40 MG tablet TAKE 1 TABLET BY MOUTH ONCE DAILY 90 tablet 2  . sertraline (ZOLOFT) 100 MG tablet Take 1 tablet (100 mg total) by mouth daily. 90 tablet 3  . Vitamin D, Ergocalciferol, (DRISDOL) 1.25 MG (50000 UNIT) CAPS capsule TAKE 1 CAPSULE (50,000 UNITS) BY MOUTH ONCE A WEEK. (Patient taking differently: Take 50,000 Units by mouth every Monday.) 12 capsule 1  . zinc gluconate 50 MG tablet Take 50 mg by mouth daily.     No current facility-administered medications for this encounter.    Allergies  Allergen Reactions  . Erythromycin Other (See Comments)    Made the patient's tongue "burn"  . Tape Rash and Other (See Comments)    PLEASE USE PAPER TAPE!!  . Meperidine Nausea And Vomiting  . Shellfish Allergy Nausea And Vomiting  . Ciprofloxacin Rash and Other (See Comments)    Rash from IV  . Codeine Nausea And Vomiting  . Latex Rash and Other (See Comments)    Made the patient's skin "burn," also  . Penicillins Rash and Other (See Comments)    Has patient had a PCN reaction causing immediate rash, facial/tongue/throat swelling, SOB or lightheadedness with hypotension: Unk Has patient had a PCN reaction causing severe rash involving mucus membranes or skin  necrosis: No Has patient had a PCN reaction that required hospitalization: No Has patient had a PCN reaction occurring within the last 10 years: No If all of the above answers are "NO", then may proceed with Cephalosporin use.     Social History   Socioeconomic History  . Marital status: Married    Spouse name: Not on file  . Number of children: 5  . Years of education: Not on file  . Highest education level: Not on file  Occupational History  . 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 Smoking status: Former Smoker    Packs/day: 0.50    Years: 40.00    Pack years: 20.00    Types: Cigarettes    Quit date: 01/19/1993    Years since quitting: 27.3  . Smokeless tobacco: Never Used  Vaping Use  . Vaping Use: Never used  Substance and Sexual Activity  . Alcohol use: Yes    Comment: 01/02/2016 "might drink a glass of wine socially q 3-4 months"  . Drug use: No  . Sexual activity: Not Currently    Birth control/protection: Post-menopausal  Other Topics Concern  . Not on file  Social History Narrative   Married with 5 children   Social Determinants of Health   Financial  Resource Strain: Not on file  Food Insecurity: Not on file  Transportation Needs: Not on file  Physical Activity: Not on file  Stress: Not on file  Social Connections: Not on file  Intimate Partner Violence: Not on file     ROS- All systems are reviewed and negative except as per the HPI above.  Physical Exam: Vitals:   05/03/20 1135  BP: 130/66  Pulse: 62  Weight: 61.2 kg  Height: 5\' 2"  (1.575 m)    GEN- The patient is well appearing elderly female, alert and oriented x 3 today.   HEENT-head normocephalic, atraumatic, sclera clear, conjunctiva pink, hearing intact, trachea midline. Lungs- Clear to ausculation bilaterally, diminished breath sounds, normal work of breathing Heart- Regular rate and rhythm, no murmurs, rubs or gallops  GI- soft, NT, ND, +  BS Extremities- no clubbing, cyanosis, or edema MS- no significant deformity or atrophy Skin- no rash or lesion Psych- euthymic mood, full affect Neuro- strength and sensation are intact   Wt Readings from Last 3 Encounters:  05/03/20 61.2 kg  04/26/20 59.1 kg  04/23/20 60.8 kg    EKG today demonstrates SR HR 62, PR 144, QRS 86, QTc 428  Echo 08/16/19 demonstrated  1. Left ventricular ejection fraction, by estimation, is 60 to 65%. The  left ventricle has normal function. The left ventricle has no regional  wall motion abnormalities. Left ventricular diastolic parameters are  indeterminate.  2. Right ventricular systolic function is moderately reduced. The right  ventricular size is moderately enlarged. moderately increased right  ventricular wall thickness. There is moderately elevated pulmonary artery  systolic pressure. The estimated right  ventricular systolic pressure is 31.5 mmHg.  3. Left atrial size was moderately dilated.  4. Right atrial size was moderately dilated.  5. The mitral valve is degenerative. Mild to moderate mitral valve  regurgitation.  6. Tricuspid valve regurgitation is severe.  7. The aortic valve is tricuspid. Aortic valve regurgitation is not  visualized. Mild to moderate aortic valve sclerosis/calcification is  present, without any evidence of aortic stenosis.  8. The inferior vena cava is dilated in size with <50% respiratory  variability, suggesting right atrial pressure of 15 mmHg.   Epic records are reviewed at length today  Assessment and Plan:  1. Persistent atrial fibrillation Patient's AAD options are limited: did not tolerate Multaq 2/2 GI side effects, class IC contraindicated with CAD, would also avoid amiodarone given lung disease.  S/p dofetilide loading 11/29-12/2/21. DCCV on 04/25/20 Patient appears to be maintaining SR. Continue dofetilide 125 mcg BID. QT stable. Check bmet/mag today. Continue diltiazem 180 mg daily and  30 mg PRN for heart racing. Continue Eliquis 5 mg BID   This patients CHA2DS2-VASc Score and unadjusted Ischemic Stroke Rate (% per year) is equal to 7.2 % stroke rate/year from a score of 5  Above score calculated as 1 point each if present [CHF, HTN, DM, Vascular=MI/PAD/Aortic Plaque, Age if 65-74, or Female] Above score calculated as 2 points each if present [Age > 75, or Stroke/TIA/TE]  2. HTN Stable, no changes today.  3. Obstructive sleep apnea Encouraged compliance with CPAP therapy.  4. CAD s/p CABG No anginal symptoms.   Follow up with Dr Curt Bears as scheduled.     [facility] [address] [place], [place] [address] [phone] 05/03/2020 11:54 AM

## 2020-05-03 ENCOUNTER — Ambulatory Visit (HOSPITAL_COMMUNITY)
Admit: 2020-05-03 | Discharge: 2020-05-03 | Disposition: A | Discharge status: Home / Self Care | Payer: Medicare HMO | Attending: Physician Assistant | Admitting: Physician Assistant

## 2020-05-03 ENCOUNTER — Other Ambulatory Visit: Payer: Self-pay

## 2020-05-03 ENCOUNTER — Encounter (HOSPITAL_COMMUNITY): Payer: Self-pay | Admitting: Physician Assistant

## 2020-05-03 VITALS — BP 130/66 | HR 62 | Ht 62.0 in | Wt 135.0 lb

## 2020-05-03 DIAGNOSIS — I252 Old myocardial infarction: Secondary | ICD-10-CM | POA: Insufficient documentation

## 2020-05-03 DIAGNOSIS — Z87891 Personal history of nicotine dependence: Secondary | ICD-10-CM | POA: Diagnosis not present

## 2020-05-03 DIAGNOSIS — D6869 Other thrombophilia: Secondary | ICD-10-CM

## 2020-05-03 DIAGNOSIS — Z955 Presence of coronary angioplasty implant and graft: Secondary | ICD-10-CM | POA: Insufficient documentation

## 2020-05-03 DIAGNOSIS — J449 Chronic obstructive pulmonary disease, unspecified: Secondary | ICD-10-CM | POA: Insufficient documentation

## 2020-05-03 DIAGNOSIS — I2510 Atherosclerotic heart disease of native coronary artery without angina pectoris: Secondary | ICD-10-CM | POA: Diagnosis not present

## 2020-05-03 DIAGNOSIS — I4819 Other persistent atrial fibrillation: Secondary | ICD-10-CM | POA: Diagnosis not present

## 2020-05-03 DIAGNOSIS — Z7901 Long term (current) use of anticoagulants: Secondary | ICD-10-CM | POA: Diagnosis not present

## 2020-05-03 DIAGNOSIS — I10 Essential (primary) hypertension: Secondary | ICD-10-CM | POA: Diagnosis not present

## 2020-05-03 DIAGNOSIS — G4733 Obstructive sleep apnea (adult) (pediatric): Secondary | ICD-10-CM | POA: Diagnosis not present

## 2020-05-03 DIAGNOSIS — E785 Hyperlipidemia, unspecified: Secondary | ICD-10-CM | POA: Insufficient documentation

## 2020-05-03 DIAGNOSIS — Z951 Presence of aortocoronary bypass graft: Secondary | ICD-10-CM | POA: Diagnosis not present

## 2020-05-03 LAB — BASIC METABOLIC PANEL
Anion gap: 10 (ref 5–15)
BUN: 9 mg/dL (ref 8–23)
CO2: 26 mmol/L (ref 22–32)
Calcium: 9 mg/dL (ref 8.9–10.3)
Chloride: 105 mmol/L (ref 98–111)
Creatinine, Ser: 0.91 mg/dL (ref 0.44–1.00)
GFR, Estimated: >60 mL/min (ref >60)
Glucose, Bld: 109 mg/dL — ABNORMAL HIGH (ref 70–99)
Potassium: 4.1 mmol/L (ref 3.5–5.1)
Sodium: 141 mmol/L (ref 135–145)

## 2020-05-03 LAB — MAGNESIUM: Magnesium: 1.9 mg/dL (ref 1.7–2.4)

## 2020-05-23 ENCOUNTER — Other Ambulatory Visit: Payer: Self-pay | Admitting: Cardiovascular Disease

## 2020-05-23 ENCOUNTER — Telehealth: Payer: Self-pay

## 2020-05-23 DIAGNOSIS — J449 Chronic obstructive pulmonary disease, unspecified: Secondary | ICD-10-CM | POA: Diagnosis not present

## 2020-05-23 NOTE — Telephone Encounter (Signed)
Pharmacy requesting refill of amlodipine which is no longer on patient's current med list. Left voicemail message requesting to have patient call back to let us know if she is stioll taking this medication or if another provider has discontinued it.

## 2020-05-23 NOTE — Telephone Encounter (Signed)
Amlodipine no longer on med list. Left voicemail message requesting to have patient call back to let us know if she is stioll taking this medication or if another provider has discontinued it.

## 2020-05-23 NOTE — Telephone Encounter (Signed)
Spoke with patient who states medication was D/C'ed after cardioversion.

## 2020-05-24 ENCOUNTER — Ambulatory Visit: Payer: Medicare HMO | Admitting: Cardiology

## 2020-05-24 ENCOUNTER — Telehealth (INDEPENDENT_AMBULATORY_CARE_PROVIDER_SITE_OTHER): Payer: Medicare HMO | Admitting: Family

## 2020-05-24 ENCOUNTER — Encounter: Payer: Self-pay | Admitting: Family

## 2020-05-24 VITALS — Temp 97.2°F | Ht 62.0 in | Wt 132.0 lb

## 2020-05-24 DIAGNOSIS — R059 Cough, unspecified: Secondary | ICD-10-CM

## 2020-05-24 DIAGNOSIS — U071 COVID-19: Secondary | ICD-10-CM

## 2020-05-24 MED ORDER — PREDNISONE 10 MG (21) PO TBPK: ORAL_TABLET | ORAL | 0 refills | Status: DC

## 2020-05-24 NOTE — Progress Notes (Deleted)
Electrophysiology Office Note   Date:  05/24/2020   ID:  Andrea [name], DOB Oct 19, 1940, MRN [id number]  PCP:  Mosie Lukes, MD  Cardiologist:  Andrea [name] Primary Electrophysiologist:  Andrea Allmon Meredith Leeds, MD    No chief complaint on file.    History of Present Illness: Andrea [name] is a 79 y.o. female who is being seen today for the evaluation of SVT at the request of Andrea [name]. Presenting today for electrophysiology evaluation.    She has a history significant for coronary artery disease status post CABG in 1999, status post PCI to the distal RCA in 2013, PCI x2 to the SVG to RCA to the mid circumflex in 2017, PVD status post lower extremity stenting, carotid artery disease status post left-sided CEA, pulmonary hypertension, valvular heart disease, COPD, infrarenal AAA, PE, hypertension, hyperlipidemia, asthma, hypothyroidism.  She wore a cardiac monitor that showed episodes of SVT/atrial tachycardia, longest 14 beats.  She had an EP study 03/19/2018 that failed to induce tachycardia.  She developed atrial fibrillation status post cardioversion 10/19/2019.  She is now status post dofetilide load.  Today, denies symptoms of palpitations, chest pain, shortness of breath, orthopnea, PND, lower extremity edema, claudication, dizziness, presyncope, syncope, bleeding, or neurologic sequela. The patient is tolerating medications without difficulties. ***    Past Medical History:  Diagnosis Date  . AAA (abdominal aortic aneurysm) ([place]) 01/2009   AAA (2.8 x 3.0)  moderate RAS (left); 2.7 x 2.7 cm (07/11/05)  . Acute bronchitis 03/20/2013; 2017  . Angina   . Anosmia   . Asthma   . Baker's cyst of knee 01/30/2014  . Basal cell carcinoma    "back and left arm"  . Bradycardia    Metoprolol stopped 08/2011  . CAD (coronary artery disease)    s/CABG (reports IMA and 2 SVGs) back in 1999  Myoview normal 3/10; s/p PCI with DES to PL branch of distal RCA 09/2011; PCI +DES to SVG-RCA, PCI + DES to mid  LCx 12/2015  . Cerebrovascular disease 01/2009   carotid u/s  R 0-39%   L 60-79%  . Chronic thoracic back pain   . COPD (chronic obstructive pulmonary disease) ([place])   . Depression   . Dizziness   . Dysrhythmia    hx of sinus brady  . GERD (gastroesophageal reflux disease)   . Heart murmur   . Herniated lumbar disc without myelopathy   . History of blood transfusion 1999   "when I had the bypass; had a PE"  . Hyperglycemia 10/23/2015  . Hyperlipidemia   . Hypertension   . Hypothyroidism 09/30/2016  . Medicare annual wellness visit, subsequent 07/31/2013  . NSTEMI (non-ST elevated myocardial infarction) ([place]) 11/12   Cath showed atretic IMA graft to the LAD, SVG to PD was patent but the continuation of this graft to the PL branch was occluded; there were L to R collaterals; Mid LAD had a 60 to 70% stenosis. She has been treated medically.  Neg Myoview 05/2011  . Osteoarthritis of back   . Osteoporosis   . Pneumonia "several times"  . Pulmonary embolism ([place]) 1999   "after my bypass"  . PVD (peripheral vascular disease) ([place])   . Shortness of breath   . Squamous carcinoma    "nose"  . Thyroid disease    Hypothyroid  . Unstable angina ([place]) 09/25/2015   Past Surgical History:  Procedure Laterality Date  . ABDOMINAL AORTIC ANEURYSM REPAIR     pt denies this hx on  01/02/2016  . BASAL CELL CARCINOMA EXCISION    . CARDIAC CATHETERIZATION N/A 01/02/2016   Procedure: Left Heart Cath and Coronary Angiography;  Surgeon: Wellington Hampshire, MD;  Location: [facility];  Service: Cardiovascular;  Laterality: N/A;  . CARDIAC CATHETERIZATION  1996; 1999  . CARDIAC CATHETERIZATION N/A 06/25/2016   Procedure: Left Heart Cath and Cors/Grafts Angiography;  Surgeon: Wellington Hampshire, MD;  Location: [facility];  Service: Cardiovascular;  Laterality: N/A;  . CARDIOVERSION N/A 10/19/2019   Procedure: CARDIOVERSION;  Surgeon: Jerline Pain, MD;  Location: [facility] ENDOSCOPY;  Service: Cardiovascular;   Laterality: N/A;  . CARDIOVERSION N/A 04/25/2020   Procedure: CARDIOVERSION;  Surgeon: Jerline Pain, MD;  Location: [facility] ENDOSCOPY;  Service: Cardiovascular;  Laterality: N/A;  . CAROTID ENDARTERECTOMY Left 01/02/2011  . CATARACT EXTRACTION W/ INTRAOCULAR LENS  IMPLANT, BILATERAL    . CLOSED REDUCTION NASAL FRACTURE  11/2007  . CORONARY ANGIOPLASTY WITH STENT PLACEMENT  10/10/2011   drug eluting  to rc & saphenous  . CORONARY ARTERY BYPASS GRAFT  1999   "CABG X3"  . DILATION AND CURETTAGE OF UTERUS    . FEMORAL ARTERY STENT Bilateral   . FRACTURE SURGERY    . LEFT HEART CATHETERIZATION WITH CORONARY/GRAFT ANGIOGRAM N/A 04/22/2011   Procedure: LEFT HEART CATHETERIZATION WITH Beatrix Fetters;  Surgeon: Jolaine Artist, MD;  Location: [facility] CATH LAB;  Service: Cardiovascular;  Laterality: N/A;  . LEFT HEART CATHETERIZATION WITH CORONARY/GRAFT ANGIOGRAM N/A 10/10/2011   Procedure: LEFT HEART CATHETERIZATION WITH Beatrix Fetters;  Surgeon: Peter M Martinique, MD;  Location: [facility];  Service: Cardiovascular;  Laterality: N/A;  . NASAL SINUS SURGERY     twice  . SQUAMOUS CELL CARCINOMA EXCISION    . SVT ABLATION N/A 03/19/2018   Procedure: SVT ABLATION;  Surgeon: Constance Haw, MD;  Location: [facility];  Service: Cardiovascular;  Laterality: N/A;  . TUBAL LIGATION       Current Outpatient Medications  Medication Sig Dispense Refill  . acetaminophen (TYLENOL) 650 MG CR tablet Take 1,300 mg by mouth 2 (two) times daily.    Marland Kitchen albuterol (PROVENTIL) (2.5 MG/3ML) 0.083% nebulizer solution Take 3 mLs (2.5 mg total) by nebulization every 4 (four) hours as needed for wheezing or shortness of breath. Dx:J44.9 75 mL 12  . albuterol (PROVENTIL) (5 MG/ML) 0.5% nebulizer solution Take 0.5 mLs (2.5 mg total) by nebulization every 6 (six) hours as needed for wheezing or shortness of breath. 20 mL 2  . albuterol (VENTOLIN HFA) 108 (90 Base) MCG/ACT inhaler INHALE 2 PUFFS INTO  THE LUNGS EVERY 6 (SIX) HOURS AS NEEDED FOR WHEEZING. 18 g 5  . apixaban (ELIQUIS) 5 MG TABS tablet Take 1 tablet (5 mg total) by mouth 2 (two) times daily. 60 tablet 3  . azelastine (ASTELIN) 0.1 % nasal spray Place 2 sprays into both nostrils at bedtime as needed for rhinitis. 30 mL 3  . Biotin 5 MG TABS Take 5 mg by mouth daily.     . Calcium Carbonate-Vitamin D (CALCIUM + D PO) Take 1 tablet by mouth 2 (two) times a week.     . Cholecalciferol (VITAMIN D3) 2000 units TABS Take 2,000 Units by mouth daily.     . clopidogrel (PLAVIX) 75 MG tablet TAKE 1 TABLET BY MOUTH ONCE A DAY 90 tablet 1  . diltiazem (CARDIZEM CD) 240 MG 24 hr capsule Take 1 capsule (240 mg total) by mouth daily. 90 capsule 2  .  diltiazem (CARDIZEM) 30 MG tablet Take one tablet by mouth every 4 hours AS NEEDED for A-fib HR > 100 as long as BP > 100. 45 tablet 0  . dofetilide (TIKOSYN) 125 MCG capsule Take 1 capsule (125 mcg total) by mouth 2 (two) times daily. 60 capsule 6  . ezetimibe (ZETIA) 10 MG tablet TAKE 1 TABLET BY MOUTH DAILY 90 tablet 1  . Fluticasone-Umeclidin-Vilant (TRELEGY ELLIPTA) 100-62.5-25 MCG/INH AEPB Inhale 1 puff into the lungs daily. 60 each 5  . furosemide (LASIX) 20 MG tablet Take 1 tablet (20 mg total) by mouth as directed. Take 1 tablet (20 mg) daily and extra 1 tablet (20 mg) at 2 PM for shortness of breath or abdominal swelling 180 tablet 2  . HYDROcodone-acetaminophen (NORCO) 10-325 MG tablet Take 1 tablet by mouth every 6 (six) hours as needed for severe pain.     Marland Kitchen ipratropium (ATROVENT) 0.02 % nebulizer solution Take 2.5 mLs (0.5 mg total) by nebulization 4 (four) times daily. 75 mL 2  . isosorbide mononitrate (IMDUR) 60 MG 24 hr tablet TAKE 1 TABLET BY MOUTH TWICE A DAY 180 tablet 1  . Krill Oil 500 MG CAPS Take 500 mg by mouth daily.     Marland Kitchen levothyroxine (SYNTHROID) 50 MCG tablet TAKE 1 TABLET BY MOUTH ONCE DAILY 90 tablet 3  . neomycin-polymyxin b-dexamethasone (MAXITROL) 3.5-10000-0.1 SUSP  Place 1 drop into both eyes every 6 (six) hours.     . nitroGLYCERIN (NITROSTAT) 0.4 MG SL tablet Place 1 tablet (0.4 mg total) under the tongue every 5 (five) minutes as needed for chest pain. 25 tablet 0  . omeprazole (PRILOSEC) 20 MG capsule Take 1 capsule (20 mg total) by mouth daily. 90 capsule 3  . Polyethyl Glycol-Propyl Glycol (SYSTANE OP) Place 1 drop into both eyes 2 (two) times daily as needed (dry/irritated eyes.).     Marland Kitchen potassium chloride (KLOR-CON) 10 MEQ tablet Take 1 tablet (10 mEq total) by mouth daily. And take 2 tablets (63meq) on days you take furosemide 135 tablet 2  . rosuvastatin (CRESTOR) 40 MG tablet TAKE 1 TABLET BY MOUTH ONCE DAILY 90 tablet 2  . sertraline (ZOLOFT) 100 MG tablet Take 1 tablet (100 mg total) by mouth daily. 90 tablet 3  . Vitamin D, Ergocalciferol, (DRISDOL) 1.25 MG (50000 UNIT) CAPS capsule TAKE 1 CAPSULE (50,000 UNITS) BY MOUTH ONCE A WEEK. (Patient taking differently: Take 50,000 Units by mouth every Monday.) 12 capsule 1  . zinc gluconate 50 MG tablet Take 50 mg by mouth daily.     No current facility-administered medications for this visit.    Allergies:   Erythromycin, Tape, Meperidine, Shellfish allergy, Ciprofloxacin, Codeine, Latex, and Penicillins   Social History:  The patient  reports that she quit smoking about 27 years ago. Her smoking use included cigarettes. She has a 20.00 pack-year smoking history. She has never used smokeless tobacco. She reports current alcohol use. She reports that she does not use drugs.   Family History:  The patient's family history includes Alcohol abuse in her brother; Aneurysm in her brother; Angina in her father; Cirrhosis in her maternal grandfather; Colon cancer (age of onset: 4) in her father; Diabetes in her maternal grandmother and mother; Heart attack in her brother and brother; Heart attack (age of onset: 40) in her mother; Heart disease in her brother, brother, and father; Hyperlipidemia in her  daughter; Hypertension in her brother and father; Hypothyroidism in her sister; Lung cancer in her father and sister;  Obesity in her daughter and son; Stroke in her maternal grandmother and paternal grandmother.   ROS:  Please see the history of present illness.   Otherwise, review of systems is positive for none.   All other systems are reviewed and negative.   PHYSICAL EXAM: VS:  There were no vitals taken for this visit. , BMI There is no height or weight on file to calculate BMI. GEN: Well nourished, well developed, in no acute distress  HEENT: normal  Neck: no JVD, carotid bruits, or masses Cardiac: ***RRR; no murmurs, rubs, or gallops,no edema  Respiratory:  clear to auscultation bilaterally, normal work of breathing GI: soft, nontender, nondistended, + BS MS: no deformity or atrophy  Skin: warm and dry Neuro:  Strength and sensation are intact Psych: euthymic mood, full affect  EKG:  EKG {ACTION; IS/IS [id number]} ordered today. Personal review of the ekg ordered *** shows ***   Recent Labs: 06/13/2019: TSH 1.98 08/16/2019: B Natriuretic Peptide 486.0 04/10/2020: ALT 12; Hemoglobin 10.7; Platelets 146 05/03/2020: BUN 9; Creatinine, Ser 0.91; Magnesium 1.9; Potassium 4.1; Sodium 141    Lipid Panel     Component Value Date/Time   CHOL 119 06/13/2019 1425   CHOL 253 (H) 03/07/2016 0930   TRIG 217.0 (H) 06/13/2019 1425   HDL 32.10 (L) 06/13/2019 1425   HDL 32 (L) 03/07/2016 0930   CHOLHDL 4 06/13/2019 1425   VLDL 43.4 (H) 06/13/2019 1425   LDLCALC 78 12/01/2017 1404   LDLCALC 183 (H) 03/07/2016 0930   LDLDIRECT 61.0 06/13/2019 1425     Wt Readings from Last 3 Encounters:  05/03/20 135 lb (61.2 kg)  04/26/20 130 lb 4.7 oz (59.1 kg)  04/23/20 134 lb (60.8 kg)      Other studies Reviewed: Additional studies/ records that were reviewed today include: TTE 04/24/20 Review of the above records today demonstrates:  1. Left ventricular ejection fraction, by estimation,  is 55 to 60%. The  left ventricle has normal function. The left ventricle has no regional  wall motion abnormalities. Left ventricular diastolic function could not  be evaluated.  2. Right ventricular systolic function is normal. The right ventricular  size is mildly enlarged. There is normal pulmonary artery systolic  pressure.  3. Left atrial size was moderately dilated.  4. Right atrial size was severely dilated.  5. The mitral valve is degenerative. Mild to moderate mitral valve  regurgitation. No evidence of mitral stenosis.  6. Tricuspid valve regurgitation is severe.  7. The aortic valve is tricuspid. There is mild calcification of the  aortic valve. There is mild thickening of the aortic valve. Aortic valve  regurgitation is trivial. Mild to moderate aortic valve  sclerosis/calcification is present, without any  evidence of aortic stenosis.  8. The inferior vena cava is normal in size with greater than 50%  respiratory variability, suggesting right atrial pressure of 3 mmHg.   Holter 01/10/18 - personally reviewed Normal sinus rhythm Rare episodes of SVT/atrial tachycardia, 130 runs longest run 14 beats with rate 138 bpm Frequent PACs, 7%  Rare PVCs, 1% Rare PVCs and bigemeny  ASSESSMENT AND PLAN:  1.  SVT: EP study without inducible arrhythmia.  Currently on diltiazem.  2.  Hypertension: Well controlled.  3.  Coronary artery disease status post CABG: No current chest pain.  Continue Plavix.  4.  Persistent atrial fibrillation: Currently on Xarelto, diltiazem, Tikosyn.  CHA2DS2-VASc of 5.  High risk medication monitoring.  Persistent atrial fibrillation: Currently on Xarelto and diltiazem.  ***  Current medicines are reviewed at length with the patient today.   The patient does not have concerns regarding her medicines.  The following changes were made today: ***  Labs/ tests ordered today include:  No orders of the defined types were placed in this  encounter.    Disposition:   FU with Kahla Risdon *** months  Signed, Aldonia Keeven Meredith Leeds, MD  05/24/2020 7:56 AM     [facility] [address] [place] [address] [phone] (office) [phone] (fax)

## 2020-05-24 NOTE — Progress Notes (Signed)
Virtual Visit via Video   I connected with patient on 05/24/20 at  2:00 PM EST by a video enabled telemedicine application and verified that I am speaking with the correct person using two identifiers.  Location patient: Home Location provider: [employer], Office Persons participating in the virtual visit: Patient, Provider, CMA   I discussed the limitations of evaluation and management by telemedicine and the availability of in person appointments. The patient expressed understanding and agreed to proceed.  Subjective:   HPI:   79 year old female is in today with c/o cough, congestion, and testing positive for COVID-19 today. She has been achy and had low grade fever. She is awaiting a PCR to get monoclonal antibodies scheduled. Rapid antigen was positive.   ROS:   See pertinent positives and negatives per HPI.  Patient Active Problem List   Diagnosis Date Noted  . PAF (paroxysmal atrial fibrillation) ([place])   . Persistent atrial fibrillation ([place]) 10/07/2019  . Secondary hypercoagulable state ([place]) 10/07/2019  . Cor pulmonale, chronic ([place]) 08/17/2019  . Acute on chronic diastolic CHF (congestive heart failure) ([place]) 08/17/2019  . COPD (chronic obstructive pulmonary disease) ([place]) 08/14/2019  . Chronic hypoxemic respiratory failure ([place]) 08/12/2019  . Atrial fibrillation, chronic ([place]) 08/12/2019  . OSA (obstructive sleep apnea) 08/12/2019  . Vitamin D deficiency 06/13/2019  . Witnessed episode of apnea 03/07/2018  . Cervical cancer screening 12/09/2017  . Preventative health care 12/09/2017  . Chronic ethmoidal sinusitis 09/04/2017  . Hypothyroidism 09/30/2016  . Presbycusis of both ears 08/05/2016  . 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 Cramps, extremity 07/03/2016  . 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 Flank pain 07/03/2016  . Stable angina ([place]) 06/17/2016  . Hx of CABG 12/27/2015  .  Hyperglycemia 10/23/2015  . Unstable angina ([place]) 09/25/2015  . Upper airway cough syndrome 03/23/2015  . Hypokalemia 07/24/2014  . Cystitis 04/11/2014  . Baker's cyst of knee 01/30/2014  . Thoracic back pain 01/24/2014  . Breast cancer screening 07/31/2013  . Dyspnea 05/09/2013  . Anosmia   . Fatigue 09/17/2011  . CAD (coronary artery disease) 06/06/2011  . NSTEMI (non-ST elevated myocardial infarction) ([place]) 04/22/2011  . Excessive somnolence disorder 12/03/2010  . BACK PAIN, LUMBAR 07/16/2010  . SINUSITIS, CHRONIC 05/07/2010  . Cough 09/10/2009  . SKIN CANCER, HX OF 04/03/2009  . Coronary artery disease involving coronary bypass graft of native heart with angina pectoris ([place]) 01/24/2009  . Carotid artery stenosis 01/24/2009  . 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  . AORTIC ANEUR [place] WITHOUT MENTION RUPTURE 01/24/2009  . POST-OP CARDIAC COMPLICATION 19/75/8832  . Hyperlipidemia 12/22/2006  . Depression, recurrent ([place]) 12/22/2006  . HTN (hypertension) 12/22/2006  . PERIPHERAL VASCULAR DISEASE 12/22/2006  . COPD GOLD C 12/22/2006  . GERD 12/22/2006  . Osteoporosis 12/22/2006    Social History   Tobacco Use  . Smoking status: Former Smoker    Packs/day: 0.50    Years: 40.00    Pack years: 20.00    Types: Cigarettes    Quit date: 01/19/1993    Years since quitting: 27.3  . Smokeless tobacco: Never Used  Substance Use Topics  . Alcohol use: Yes    Comment: 01/02/2016 "might drink a glass of wine socially q 3-4 months"    Current Outpatient Medications:  .  acetaminophen (TYLENOL) 650 MG CR tablet, Take 1,300 mg by mouth 2 (two) times  daily., Disp: , Rfl:  .  albuterol (PROVENTIL) (2.5 MG/3ML) 0.083% nebulizer solution, Take 3 mLs (2.5 mg total) by nebulization every 4 (four) hours as needed for wheezing or shortness of breath. Dx:J44.9, Disp: 75 mL, Rfl: 12 .  albuterol (PROVENTIL) (5 MG/ML) 0.5% nebulizer  solution, Take 0.5 mLs (2.5 mg total) by nebulization every 6 (six) hours as needed for wheezing or shortness of breath., Disp: 20 mL, Rfl: 2 .  albuterol (VENTOLIN HFA) 108 (90 Base) MCG/ACT inhaler, INHALE 2 PUFFS INTO THE LUNGS EVERY 6 (SIX) HOURS AS NEEDED FOR WHEEZING., Disp: 18 g, Rfl: 5 .  apixaban (ELIQUIS) 5 MG TABS tablet, Take 1 tablet (5 mg total) by mouth 2 (two) times daily., Disp: 60 tablet, Rfl: 3 .  azelastine (ASTELIN) 0.1 % nasal spray, Place 2 sprays into both nostrils at bedtime as needed for rhinitis., Disp: 30 mL, Rfl: 3 .  Biotin 5 MG TABS, Take 5 mg by mouth daily. , Disp: , Rfl:  .  Calcium Carbonate-Vitamin D (CALCIUM + D PO), Take 1 tablet by mouth 2 (two) times a week. , Disp: , Rfl:  .  Cholecalciferol (VITAMIN D3) 2000 units TABS, Take 2,000 Units by mouth daily. , Disp: , Rfl:  .  clopidogrel (PLAVIX) 75 MG tablet, TAKE 1 TABLET BY MOUTH ONCE A DAY, Disp: 90 tablet, Rfl: 1 .  diltiazem (CARDIZEM CD) 240 MG 24 hr capsule, Take 1 capsule (240 mg total) by mouth daily., Disp: 90 capsule, Rfl: 2 .  diltiazem (CARDIZEM) 30 MG tablet, Take one tablet by mouth every 4 hours AS NEEDED for A-fib HR > 100 as long as BP > 100., Disp: 45 tablet, Rfl: 0 .  dofetilide (TIKOSYN) 125 MCG capsule, Take 1 capsule (125 mcg total) by mouth 2 (two) times daily., Disp: 60 capsule, Rfl: 6 .  ezetimibe (ZETIA) 10 MG tablet, TAKE 1 TABLET BY MOUTH DAILY, Disp: 90 tablet, Rfl: 1 .  Fluticasone-Umeclidin-Vilant (TRELEGY ELLIPTA) 100-62.5-25 MCG/INH AEPB, Inhale 1 puff into the lungs daily., Disp: 60 each, Rfl: 5 .  furosemide (LASIX) 20 MG tablet, Take 1 tablet (20 mg total) by mouth as directed. Take 1 tablet (20 mg) daily and extra 1 tablet (20 mg) at 2 PM for shortness of breath or abdominal swelling, Disp: 180 tablet, Rfl: 2 .  HYDROcodone-acetaminophen (NORCO) 10-325 MG tablet, Take 1 tablet by mouth every 6 (six) hours as needed for severe pain. , Disp: , Rfl:  .  isosorbide mononitrate  (IMDUR) 60 MG 24 hr tablet, TAKE 1 TABLET BY MOUTH TWICE A DAY, Disp: 180 tablet, Rfl: 1 .  Krill Oil 500 MG CAPS, Take 500 mg by mouth daily. , Disp: , Rfl:  .  levothyroxine (SYNTHROID) 50 MCG tablet, TAKE 1 TABLET BY MOUTH ONCE DAILY, Disp: 90 tablet, Rfl: 3 .  omeprazole (PRILOSEC) 20 MG capsule, Take 1 capsule (20 mg total) by mouth daily., Disp: 90 capsule, Rfl: 3 .  Polyethyl Glycol-Propyl Glycol (SYSTANE OP), Place 1 drop into both eyes 2 (two) times daily as needed (dry/irritated eyes.). , Disp: , Rfl:  .  potassium chloride (KLOR-CON) 10 MEQ tablet, Take 1 tablet (10 mEq total) by mouth daily. And take 2 tablets (100meq) on days you take furosemide, Disp: 135 tablet, Rfl: 2 .  predniSONE (STERAPRED UNI-PAK 21 TAB) 10 MG (21) TBPK tablet, As directed, Disp: 21 tablet, Rfl: 0 .  rosuvastatin (CRESTOR) 40 MG tablet, TAKE 1 TABLET BY MOUTH ONCE DAILY, Disp: 90 tablet, Rfl:  2 .  sertraline (ZOLOFT) 100 MG tablet, Take 1 tablet (100 mg total) by mouth daily., Disp: 90 tablet, Rfl: 3 .  Vitamin D, Ergocalciferol, (DRISDOL) 1.25 MG (50000 UNIT) CAPS capsule, TAKE 1 CAPSULE (50,000 UNITS) BY MOUTH ONCE A WEEK. (Patient taking differently: Take 50,000 Units by mouth every Monday.), Disp: 12 capsule, Rfl: 1 .  zinc gluconate 50 MG tablet, Take 50 mg by mouth daily., Disp: , Rfl:  .  ipratropium (ATROVENT) 0.02 % nebulizer solution, Take 2.5 mLs (0.5 mg total) by nebulization 4 (four) times daily. (Patient not taking: Reported on 05/24/2020), Disp: 75 mL, Rfl: 2 .  neomycin-polymyxin b-dexamethasone (MAXITROL) 3.5-10000-0.1 SUSP, Place 1 drop into both eyes every 6 (six) hours.  (Patient not taking: Reported on 05/24/2020), Disp: , Rfl:  .  nitroGLYCERIN (NITROSTAT) 0.4 MG SL tablet, Place 1 tablet (0.4 mg total) under the tongue every 5 (five) minutes as needed for chest pain. (Patient not taking: Reported on 05/24/2020), Disp: 25 tablet, Rfl: 0  Allergies  Allergen Reactions  . Erythromycin Other  (See Comments)    Made the patient's tongue "burn"  . Tape Rash and Other (See Comments)    PLEASE USE PAPER TAPE!!  . Meperidine Nausea And Vomiting  . Shellfish Allergy Nausea And Vomiting  . Ciprofloxacin Rash and Other (See Comments)    Rash from IV  . Codeine Nausea And Vomiting  . Latex Rash and Other (See Comments)    Made the patient's skin "burn," also  . Penicillins Rash and Other (See Comments)    Has patient had a PCN reaction causing immediate rash, facial/tongue/throat swelling, SOB or lightheadedness with hypotension: Unk Has patient had a PCN reaction causing severe rash involving mucus membranes or skin necrosis: No Has patient had a PCN reaction that required hospitalization: No Has patient had a PCN reaction occurring within the last 10 years: No If all of the above answers are "NO", then may proceed with Cephalosporin use.     Objective:   Temp (!) 97.2 F (36.2 C) (Temporal)   Ht 5\' 2"  (1.575 m)   Wt 132 lb (59.9 kg)   BMI 24.14 kg/m   Patient is well-developed, well-nourished in no acute distress.  Resting comfortably at home.  Head is normocephalic, atraumatic.  No labored breathing.  Speech is clear and coherent with logical content.  Patient is alert and oriented at baseline.    Assessment and Plan:    Andrea [name] was seen today for cough.  Diagnoses and all orders for this visit:  COVID-19  Cough  Other orders -     predniSONE (STERAPRED UNI-PAK 21 TAB) 10 MG (21) TBPK tablet; As directed   Call if [facility] does not schedule mAb therapy given she is high risk  Kennyth Arnold, FNP 05/24/2020  .

## 2020-05-31 ENCOUNTER — Ambulatory Visit: Payer: Medicare HMO | Admitting: Emergency Medicine

## 2020-06-14 ENCOUNTER — Other Ambulatory Visit: Payer: Self-pay | Admitting: Cardiovascular Disease

## 2020-06-21 NOTE — Progress Notes (Unsigned)
Cardiology Office Note Date:  06/22/2020  Patient ID:  Andrea, [name] Apr 21, 1941, MRN [id number] PCP:  Mosie Lukes, MD  Cardiologist:  Dr. Rockey Situ Electrophysiologist: Dr. Curt Bears     Chief Complaint:  5mo post Tikosyn  History of Present Illness: Andrea [name] is a 80 y.o. female with history of CAD (CABG 1999, PCIs 2013, 2017), PVD (s/p L CEA 2012), COPD (uses PRN O2), p.HTN, HTN, HLD, h/o PE, hypothyroidism, and AFib.  She comes in today to be seen for Dr. Curt Bears, now 58mo s/p Tikosyn initiation. Tikosyn dosed according to renal function, QTc stable, + DCCV  She saw the AFib clinic for her 1week visit, was in ST, QTc stable, no changes were made, labs stable  COVID + 05/24/20 out patient, planned for MCA tx  TODAY She is doing better from her COVID infection, felt awful for about a week, her appetite is inproving but not back to normal quite yet and her congestion sis much better.  Symptomatically she was never aware of her Afib, mostly noted by her watch. She is not sure, but does not thnk she had Af through her COVID. No unusual SOB. No near syncope or syncope. No bleeding or signs of bleeding  She denies CP, but for about a month has felt a slight/vague ache at the posterior angle of her jaw, right in front of her ears b/l. It is not exertional or postional, no radiation, quite random and very slight self resolves quickly after starts. She says is not like her anginal symptoms which is an pain in her forehead.    AFib Hx Diagnosed June 2020  AAD Hx Tikosyn started Nov 2021  Past Medical History:  Diagnosis Date  . AAA (abdominal aortic aneurysm) ([place]) 01/2009   AAA (2.8 x 3.0)  moderate RAS (left); 2.7 x 2.7 cm (07/11/05)  . Acute bronchitis 03/20/2013; 2017  . Angina   . Anosmia   . Asthma   . Baker's cyst of knee 01/30/2014  . Basal cell carcinoma    "back and left arm"  . Bradycardia    Metoprolol stopped 08/2011  . CAD (coronary artery disease)     s/CABG (reports IMA and 2 SVGs) back in 1999  Myoview normal 3/10; s/p PCI with DES to PL branch of distal RCA 09/2011; PCI +DES to SVG-RCA, PCI + DES to mid LCx 12/2015  . Cerebrovascular disease 01/2009   carotid u/s  R 0-39%   L 60-79%  . Chronic thoracic back pain   . COPD (chronic obstructive pulmonary disease) ([place])   . Depression   . Dizziness   . Dysrhythmia    hx of sinus brady  . GERD (gastroesophageal reflux disease)   . Heart murmur   . Herniated lumbar disc without myelopathy   . History of blood transfusion 1999   "when I had the bypass; had a PE"  . Hyperglycemia 10/23/2015  . Hyperlipidemia   . Hypertension   . Hypothyroidism 09/30/2016  . Medicare annual wellness visit, subsequent 07/31/2013  . NSTEMI (non-ST elevated myocardial infarction) ([place]) 11/12   Cath showed atretic IMA graft to the LAD, SVG to PD was patent but the continuation of this graft to the PL branch was occluded; there were L to R collaterals; Mid LAD had a 60 to 70% stenosis. She has been treated medically.  Neg Myoview 05/2011  . Osteoarthritis of back   . Osteoporosis   . Pneumonia "several times"  . Pulmonary embolism ([place]) 1999   "  after my bypass"  . PVD (peripheral vascular disease) ([place])   . Shortness of breath   . Squamous carcinoma    "nose"  . Thyroid disease    Hypothyroid  . Unstable angina ([place]) 09/25/2015    Past Surgical History:  Procedure Laterality Date  . ABDOMINAL AORTIC ANEURYSM REPAIR     pt denies this hx on 01/02/2016  . BASAL CELL CARCINOMA EXCISION    . CARDIAC CATHETERIZATION N/A 01/02/2016   Procedure: Left Heart Cath and Coronary Angiography;  Surgeon: Wellington Hampshire, MD;  Location: [place] [facility] LAB;  Service: Cardiovascular;  Laterality: N/A;  . CARDIAC CATHETERIZATION  1996; 1999  . CARDIAC CATHETERIZATION N/A 06/25/2016   Procedure: Left Heart Cath and Cors/Grafts Angiography;  Surgeon: Wellington Hampshire, MD;  Location: [place] [facility];  Service: Cardiovascular;   Laterality: N/A;  . CARDIOVERSION N/A 10/19/2019   Procedure: CARDIOVERSION;  Surgeon: Jerline Pain, MD;  Location[facility] ENDOSCOPY;  Service: Cardiovascular;  Laterality: N/A;  . CARDIOVERSION N/A 04/25/2020   Procedure: CARDIOVERSION;  Surgeon: Jerline Pain, MD;  Location: [facility] ENDOSCOPY;  Service: Cardiovascular;  Laterality: N/A;  . CAROTID ENDARTERECTOMY Left 01/02/2011  . CATARACT EXTRACTION W/ INTRAOCULAR LENS  IMPLANT, BILATERAL    . CLOSED REDUCTION NASAL FRACTURE  11/2007  . CORONARY ANGIOPLASTY WITH STENT PLACEMENT  10/10/2011   drug eluting  to rc & saphenous  . CORONARY ARTERY BYPASS GRAFT  1999   "CABG X3"  . DILATION AND CURETTAGE OF UTERUS    . FEMORAL ARTERY STENT Bilateral   . FRACTURE SURGERY    . LEFT HEART CATHETERIZATION WITH CORONARY/GRAFT ANGIOGRAM N/A 04/22/2011   Procedure: LEFT HEART CATHETERIZATION WITH Beatrix Fetters;  Surgeon: Jolaine Artist, MD;  Location[facility] C[facility] LAB;  Service: Cardiovascular;  Laterality: N/A;  . LEFT HEART CATHETERIZATION WITH CORONARY/GRAFT ANGIOGRAM N/A 10/10/2011   Procedure: LEFT HEART CATHETERIZATION WITH Beatrix Fetters;  Surgeon: Peter M Martinique, MD;  Location: [facility] CATH LAB;  Service: Cardiovascular;  Laterality: N/A;  . NASAL SINUS SURGERY     twice  . SQUAMOUS CELL CARCINOMA EXCISION    . SVT ABLATION N/A 03/19/2018   Procedure: SVT ABLATION;  Surgeon: Constance Haw, MD;  Location: [place] [facility] LAB;  Service: Cardiovascular;  Laterality: N/A;  . TUBAL LIGATION      Current Outpatient Medications  Medication Sig Dispense Refill  . acetaminophen (TYLENOL) 650 MG CR tablet Take 1,300 mg by mouth 2 (two) times daily.    Marland Kitchen albuterol (PROVENTIL) (2.5 MG/3ML) 0.083% nebulizer solution Take 3 mLs (2.5 mg total) by nebulization every 4 (four) hours as needed for wheezing or shortness of breath. Dx:J44.9 75 mL 12  . albuterol (PROVENTIL) (5 MG/ML) 0.5% nebulizer solution Take 0.5 mLs (2.5 mg total) by  nebulization every 6 (six) hours as needed for wheezing or shortness of breath. 20 mL 2  . albuterol (VENTOLIN HFA) 108 (90 Base) MCG/ACT inhaler INHALE 2 PUFFS INTO THE LUNGS EVERY 6 (SIX) HOURS AS NEEDED FOR WHEEZING. 18 g 5  . apixaban (ELIQUIS) 5 MG TABS tablet Take 1 tablet (5 mg total) by mouth 2 (two) times daily. 60 tablet 3  . azelastine (ASTELIN) 0.1 % nasal spray Place 2 sprays into both nostrils at bedtime as needed for rhinitis. 30 mL 3  . Biotin 5 MG TABS Take 5 mg by mouth daily.     . Calcium Carbonate-Vitamin D (CALCIUM + D PO) Take 1 tablet by mouth 2 (two)  times a week.     . Cholecalciferol (VITAMIN D3) 2000 units TABS Take 2,000 Units by mouth daily.     . clopidogrel (PLAVIX) 75 MG tablet TAKE 1 TABLET BY MOUTH ONCE A DAY 90 tablet 1  . diltiazem (CARDIZEM CD) 240 MG 24 hr capsule Take 1 capsule (240 mg total) by mouth daily. 90 capsule 2  . diltiazem (CARDIZEM) 30 MG tablet Take one tablet by mouth every 4 hours AS NEEDED for A-fib HR > 100 as long as BP > 100. 45 tablet 0  . dofetilide (TIKOSYN) 125 MCG capsule Take 1 capsule (125 mcg total) by mouth 2 (two) times daily. 60 capsule 6  . ezetimibe (ZETIA) 10 MG tablet TAKE 1 TABLET BY MOUTH DAILY 90 tablet 1  . Fluticasone-Umeclidin-Vilant (TRELEGY ELLIPTA) 100-62.5-25 MCG/INH AEPB Inhale 1 puff into the lungs daily. 60 each 5  . HYDROcodone-acetaminophen (NORCO) 10-325 MG tablet Take 1 tablet by mouth every 6 (six) hours as needed for severe pain.     Marland Kitchen ipratropium (ATROVENT) 0.02 % nebulizer solution Take 2.5 mLs (0.5 mg total) by nebulization 4 (four) times daily. 75 mL 2  . isosorbide mononitrate (IMDUR) 60 MG 24 hr tablet TAKE 1 TABLET BY MOUTH TWICE A DAY 180 tablet 1  . Krill Oil 500 MG CAPS Take 500 mg by mouth daily.     Marland Kitchen levothyroxine (SYNTHROID) 50 MCG tablet TAKE 1 TABLET BY MOUTH ONCE DAILY 90 tablet 3  . neomycin-polymyxin b-dexamethasone (MAXITROL) 3.5-10000-0.1 SUSP Place 1 drop into both eyes every 6 (six)  hours.    Marland Kitchen omeprazole (PRILOSEC) 20 MG capsule Take 1 capsule (20 mg total) by mouth daily. 90 capsule 3  . Polyethyl Glycol-Propyl Glycol (SYSTANE OP) Place 1 drop into both eyes 2 (two) times daily as needed (dry/irritated eyes.).     Marland Kitchen potassium chloride (KLOR-CON) 10 MEQ tablet Take 1 tablet (10 mEq total) by mouth daily. And take 2 tablets (37meq) on days you take furosemide 135 tablet 2  . predniSONE (STERAPRED UNI-PAK 21 TAB) 10 MG (21) TBPK tablet As directed 21 tablet 0  . rosuvastatin (CRESTOR) 40 MG tablet TAKE 1 TABLET BY MOUTH ONCE DAILY 90 tablet 2  . sertraline (ZOLOFT) 100 MG tablet Take 1 tablet (100 mg total) by mouth daily. 90 tablet 3  . Vitamin D, Ergocalciferol, (DRISDOL) 1.25 MG (50000 UNIT) CAPS capsule TAKE 1 CAPSULE (50,000 UNITS) BY MOUTH ONCE A WEEK. (Patient taking differently: Take 50,000 Units by mouth every Monday.) 12 capsule 1  . zinc gluconate 50 MG tablet Take 50 mg by mouth daily.    . furosemide (LASIX) 20 MG tablet Take 1 tablet (20 mg total) by mouth as directed. Take 1 tablet (20 mg) daily and extra 1 tablet (20 mg) at 2 PM for shortness of breath or abdominal swelling 180 tablet 2  . nitroGLYCERIN (NITROSTAT) 0.4 MG SL tablet Place 1 tablet (0.4 mg total) under the tongue every 5 (five) minutes as needed for chest pain. 25 tablet 3   No current facility-administered medications for this visit.    Allergies:   Erythromycin, Tape, Meperidine, Shellfish allergy, Ciprofloxacin, Codeine, Latex, and Penicillins   Social History:  The patient  reports that she quit smoking about 27 years ago. Her smoking use included cigarettes. She has a 20.00 pack-year smoking history. She has never used smokeless tobacco. She reports current alcohol use. She reports that she does not use drugs.   Family History:  The patient's family history  includes Alcohol abuse in her brother; Aneurysm in her brother; Angina in her father; Cirrhosis in her maternal grandfather; Colon  cancer (age of onset: 57) in her father; Diabetes in her maternal grandmother and mother; Heart attack in her brother and brother; Heart attack (age of onset: 59) in her mother; Heart disease in her brother, brother, and father; Hyperlipidemia in her daughter; Hypertension in her brother and father; Hypothyroidism in her sister; Lung cancer in her father and sister; Obesity in her daughter and son; Stroke in her maternal grandmother and paternal grandmother.  ROS:  Please see the history of present illness.    All other systems are reviewed and otherwise negative.   PHYSICAL EXAM:  VS:  BP (!) 148/88   Pulse 73   Ht 5\' 2"  (1.575 m)   Wt 130 lb (59 kg)   SpO2 99%   BMI 23.78 kg/m  BMI: Body mass index is 23.78 kg/m. Well nourished, well developed, in no acute distress HEENT: normocephalic, atraumatic Neck: no JVD, carotid bruits or masses Cardiac:  RRR; one extrasystole noted, no significant murmurs, no rubs, or gallops Lungs:   CTA b/l, no wheezing, rhonchi or rales Abd: soft, nontender MS: no deformity, age appropriate atrophy Ext: no edema Skin: warm and dry, no rash Neuro:  No gross deficits appreciated Psych: euthymic mood, full affect    EKG:  Done today and reviewed by myself shows  SR 73bpm, QTc 480ms V5 appears to have a signal not appreciated elsewhere, suspect perhaps lead related, not true electral signal  04/24/2020 TTE IMPRESSIONS  1. Left ventricular ejection fraction, by estimation, is 55 to 60%. The  left ventricle has normal function. The left ventricle has no regional  wall motion abnormalities. Left ventricular diastolic function could not  be evaluated.  2. Right ventricular systolic function is normal. The right ventricular  size is mildly enlarged. There is normal pulmonary artery systolic  pressure.  3. Left atrial size was moderately dilated.  4. Right atrial size was severely dilated.  5. The mitral valve is degenerative. Mild to moderate  mitral valve  regurgitation. No evidence of mitral stenosis.  6. Tricuspid valve regurgitation is severe.  7. The aortic valve is tricuspid. There is mild calcification of the  aortic valve. There is mild thickening of the aortic valve. Aortic valve  regurgitation is trivial. Mild to moderate aortic valve  sclerosis/calcification is present, without any  evidence of aortic stenosis.  8. The inferior vena cava is normal in size with greater than 50%  respiratory variability, suggesting right atrial pressure of 3 mmHg.   Comparison(s): No significant change from prior study.   08/16/2019 TTE IMPRESSIONS  1. Left ventricular ejection fraction, by estimation, is 60 to 65%. The  left ventricle has normal function. The left ventricle has no regional  wall motion abnormalities. Left ventricular diastolic parameters are  indeterminate.  2. Right ventricular systolic function is moderately reduced. The right  ventricular size is moderately enlarged. moderately increased right  ventricular wall thickness. There is moderately elevated pulmonary artery  systolic pressure. The estimated right  ventricular systolic pressure is 35.4 mmHg.  3. Left atrial size was moderately dilated.  4. Right atrial size was moderately dilated.  5. The mitral valve is degenerative. Mild to moderate mitral valve  regurgitation.  6. Tricuspid valve regurgitation is severe.  7. The aortic valve is tricuspid. Aortic valve regurgitation is not  visualized. Mild to moderate aortic valve sclerosis/calcification is  present, without any evidence  of aortic stenosis.  8. The inferior vena cava is dilated in size with <50% respiratory  variability, suggesting right atrial pressure of 15 mmHg.   Recent Labs: 08/16/2019: B Natriuretic Peptide 486.0 04/10/2020: ALT 12; Hemoglobin 10.7; Platelets 146 05/03/2020: BUN 9; Creatinine, Ser 0.91; Magnesium 1.9; Potassium 4.1; Sodium 141  No results found for requested  labs within last 8760 hours.   CrCl cannot be calculated (Patient's most recent lab result is older than the maximum 21 days allowed.).   Wt Readings from Last 3 Encounters:  06/22/20 130 lb (59 kg)  05/24/20 132 lb (59.9 kg)  05/03/20 135 lb (61.2 kg)     Other studies reviewed: Additional studies/records reviewed today include: summarized above  ASSESSMENT AND PLAN:  1. Persistent AFib      CHA2DS2Vasc is 5, on eliquis     She has lost weight with her COVID infection, and is starting to eat better again, no change for now     Tikosyn, stable QTc       Appears to be maintaining SR      Labs today     Med list reviewed     Tikosyn teaching revisited  She will see cardiology team in the next few weeks hopefully, if weight not back up >60kg will need to consider reduction in dose  2. CAD     On P;avix (also has PVD), statin, nitrate     Symptom she mentions sounds fairly atypical and different from her known anginal symptom     No ischemic looking EKG changes     Will have her see Dr. Rockey Situ or APP at a next available visit  3. COPD     At her baseline  4, HTN     No changes today, f/u with cardiology soon   Disposition: F/u with cardiology team at their next available visit, EP in 52mo, sooner if needed  Current medicines are reviewed at length with the patient today.  The patient did not have any concerns regarding medicines.  Venetia Night, PA-C 06/22/2020 10:56 AM     [facility] [place] [address] [phone] (office)  [phone] (fax)

## 2020-06-22 ENCOUNTER — Encounter: Payer: Self-pay | Admitting: Physician Assistant

## 2020-06-22 ENCOUNTER — Other Ambulatory Visit: Payer: Self-pay

## 2020-06-22 ENCOUNTER — Ambulatory Visit: Payer: Medicare HMO | Admitting: Physician Assistant

## 2020-06-22 VITALS — BP 148/88 | HR 73 | Ht 62.0 in | Wt 130.0 lb

## 2020-06-22 DIAGNOSIS — I4819 Other persistent atrial fibrillation: Secondary | ICD-10-CM | POA: Diagnosis not present

## 2020-06-22 DIAGNOSIS — Z5181 Encounter for therapeutic drug level monitoring: Secondary | ICD-10-CM | POA: Diagnosis not present

## 2020-06-22 DIAGNOSIS — I10 Essential (primary) hypertension: Secondary | ICD-10-CM

## 2020-06-22 DIAGNOSIS — Z79899 Other long term (current) drug therapy: Secondary | ICD-10-CM

## 2020-06-22 DIAGNOSIS — I25810 Atherosclerosis of coronary artery bypass graft(s) without angina pectoris: Secondary | ICD-10-CM

## 2020-06-22 LAB — BASIC METABOLIC PANEL
BUN/Creatinine Ratio: 12 (ref 12–28)
BUN: 9 mg/dL (ref 8–27)
CO2: 26 mmol/L (ref 20–29)
Calcium: 9.4 mg/dL (ref 8.7–10.3)
Chloride: 101 mmol/L (ref 96–106)
Creatinine, Ser: 0.78 mg/dL (ref 0.57–1.00)
GFR calc Af Amer: 82 mL/min/{1.73_m2} (ref >59)
GFR calc non Af Amer: 72 mL/min/{1.73_m2} (ref >59)
Glucose: 84 mg/dL (ref 65–99)
Potassium: 3.9 mmol/L (ref 3.5–5.2)
Sodium: 143 mmol/L (ref 134–144)

## 2020-06-22 LAB — MAGNESIUM: Magnesium: 1.8 mg/dL (ref 1.6–2.3)

## 2020-06-22 MED ORDER — NITROGLYCERIN 0.4 MG SL SUBL: 0.4000 mg | SUBLINGUAL_TABLET | SUBLINGUAL | 3 refills | Status: DC | PRN

## 2020-06-22 NOTE — Patient Instructions (Signed)
Medication Instructions:   Your physician recommends that you continue on your current medications as directed. Please refer to the Current Medication list given to you today. *If you need a refill on your cardiac medications before your next appointment, please call your pharmacy*   Lab Work: [facility] AND [place]   If you have labs (blood work) drawn today and your tests are completely normal, you will receive your results only by: Marland Kitchen MyChart Message (if you have MyChart) OR . A paper copy in the mail If you have any lab test that is abnormal or we need to change your treatment, we will call you to review the results.   Testing/Procedures:NONE ORDERED  TODAY   Follow-Up: At [facility], you and your health needs are our priority.  As part of our continuing mission to provide you with exceptional heart care, we have created designated Provider Care Teams.  These Care Teams include your primary Cardiologist (physician) and Advanced Practice Providers (APPs -  Physician Assistants and Nurse Practitioners) who all work together to provide you with the care you need, when you need it.  We recommend signing up for the patient portal called "MyChart".  Sign up information is provided on this After Visit Summary.  MyChart is used to connect with patients for Virtual Visits (Telemedicine).  Patients are able to view lab/test results, encounter notes, upcoming appointments, etc.  Non-urgent messages can be sent to your provider as well.   To learn more about what you can do with MyChart, go to [email or web address].    Your next appointment:    DR Rockey Situ (RECALL) APPOINTMENT IN MAY 2022  6 month(s)  The format for your next appointment:   In Person  Provider:   Allegra Lai, MD   Other Instructions

## 2020-06-23 DIAGNOSIS — J449 Chronic obstructive pulmonary disease, unspecified: Secondary | ICD-10-CM | POA: Diagnosis not present

## 2020-06-24 DIAGNOSIS — G4733 Obstructive sleep apnea (adult) (pediatric): Secondary | ICD-10-CM | POA: Diagnosis not present

## 2020-07-12 ENCOUNTER — Other Ambulatory Visit: Payer: Self-pay | Admitting: Family Medicine

## 2020-07-13 ENCOUNTER — Other Ambulatory Visit: Payer: Self-pay

## 2020-07-20 DIAGNOSIS — D333 Benign neoplasm of cranial nerves: Secondary | ICD-10-CM | POA: Diagnosis not present

## 2020-07-23 DIAGNOSIS — J449 Chronic obstructive pulmonary disease, unspecified: Secondary | ICD-10-CM | POA: Diagnosis not present

## 2020-08-10 ENCOUNTER — Other Ambulatory Visit: Payer: Self-pay | Admitting: Emergency Medicine

## 2020-08-10 ENCOUNTER — Other Ambulatory Visit (HOSPITAL_COMMUNITY): Payer: Self-pay | Admitting: Physician Assistant

## 2020-08-10 NOTE — Telephone Encounter (Addendum)
Eliquis 5mg  refill request received. Patient is 80 years old, weight-59kg, Crea-0.78 on 06/22/20, Diagnosis-Afib, and last seen by Tommye Standard on 06/19/2020. Dose is inappropriate based on dosing criteria. Will send a message to Cardiologist (Dr. Curt Bears).  Also, called the pt to get an updated weight but had to leave a message for her to call back directly at [phone]

## 2020-08-15 ENCOUNTER — Telehealth: Payer: Self-pay | Admitting: Cardiology

## 2020-08-15 MED ORDER — APIXABAN 2.5 MG PO TABS: 2.5000 mg | ORAL_TABLET | Freq: Two times a day (BID) | ORAL | 5 refills | Status: DC

## 2020-08-15 NOTE — Telephone Encounter (Signed)
Will reduce dosage of Eliquis per Dr Curt Bears to 2.5mg  BID.  Attempted to call pt and notify her of Eliquis dosage change.  LMOM at home number and cell number.  Will await call back.

## 2020-08-15 NOTE — Telephone Encounter (Signed)
Spoke to pharmacy. Aware ok for pt to be on both medications and that Eliquis dosing is correct. She appreciates our return call

## 2020-08-15 NOTE — Telephone Encounter (Signed)
Pt c/o medication issue:  1. Name of Medication:  apixaban (ELIQUIS) 2.5 MG TABS tablet clopidogrel (PLAVIX) 75 MG tablet  2. How are you currently taking this medication (dosage and times per day)?   3. Are you having a reaction (difficulty breathing--STAT)?   4. What is your medication issue? Pharmacy wanted to confirm that the patient should be on both medications.  Pharmacy also wanted to confirm that the reduced dosage of the eliquis from 5 mg to 2.5 mg is correct

## 2020-08-15 NOTE — Telephone Encounter (Signed)
Spoke with pt notified of dosage change, will sent new rx to pharmacy.

## 2020-08-15 NOTE — Telephone Encounter (Signed)
-----   Message from Stanton Kidney, RN sent at 08/15/2020  8:02 AM EDT ----- Regarding: FW: Eliquis doseage  ----- Message ----- From: Constance Haw, MD Sent: 08/13/2020  11:35 AM EDT To: Stanton Kidney, RN Subject: FW: Eliquis doseage                            Ok to adjust dose of eliquis. ----- Message ----- From: Marcos Eke, RN Sent: 08/10/2020   4:45 PM EDT To: Constance Haw, MD Subject: Eliquis doseage                                Dr. Curt Bears, We received a refill request for the pt's Eliquis 5mg  tablet. Patient is 80 years old, weight-59kg, Crea-0.78 on 06/22/20,Diagnosis-Afib, and last seen by Tommye Standard on 06/19/2020. Dose is inappropriate based on dosing criteria since wt <60kg & age >43yrs old. Please advise.  Thank you, Derrel Nip, RN, BSN

## 2020-08-21 DIAGNOSIS — I509 Heart failure, unspecified: Secondary | ICD-10-CM | POA: Diagnosis not present

## 2020-08-21 DIAGNOSIS — I270 Primary pulmonary hypertension: Secondary | ICD-10-CM | POA: Diagnosis not present

## 2020-08-21 DIAGNOSIS — J449 Chronic obstructive pulmonary disease, unspecified: Secondary | ICD-10-CM | POA: Diagnosis not present

## 2020-09-10 ENCOUNTER — Other Ambulatory Visit: Payer: Self-pay | Admitting: Cardiology

## 2020-09-21 DIAGNOSIS — I270 Primary pulmonary hypertension: Secondary | ICD-10-CM | POA: Diagnosis not present

## 2020-09-21 DIAGNOSIS — J449 Chronic obstructive pulmonary disease, unspecified: Secondary | ICD-10-CM | POA: Diagnosis not present

## 2020-09-21 DIAGNOSIS — I509 Heart failure, unspecified: Secondary | ICD-10-CM | POA: Diagnosis not present

## 2020-09-28 DIAGNOSIS — G4733 Obstructive sleep apnea (adult) (pediatric): Secondary | ICD-10-CM | POA: Diagnosis not present

## 2020-10-04 ENCOUNTER — Other Ambulatory Visit: Payer: Self-pay

## 2020-10-04 NOTE — Patient Outreach (Signed)
[place] [facility]) Care Management  10/04/2020  Andrea [name] 17-Jun-1940 [id number]   Telephone call to patient for follow up for disease management assessment. No answer. HIPAA compliant voice message left.    Plan: RN CM will attempt again within 4 business days and send letter.  Jone Baseman, RN, MSN [place] Management Care Management Coordinator Direct Line [phone] Cell [phone] Toll Free: [phone]  Fax: [phone]

## 2020-10-05 ENCOUNTER — Other Ambulatory Visit: Payer: Self-pay

## 2020-10-05 NOTE — Patient Outreach (Signed)
Andrea [name] [facility]) Care Management  10/05/2020  Andrea [name] 05-22-1941 [id number]   Telephone call to patient for follow up for disease management assessment. No answer. HIPAA compliant voice message left.    Plan: RN CM will attempt again within 4 business days.  Jone Baseman, RN, MSN [place] Management Care Management Coordinator Direct Line [phone] Cell [phone] Toll Free: [phone]  Fax: [phone]

## 2020-10-08 ENCOUNTER — Other Ambulatory Visit: Payer: Self-pay

## 2020-10-08 NOTE — Patient Outreach (Signed)
[place] [facility]) Care Management  10/08/2020  Andrea [name] Oct 10, 1940 [id number]   Telephone call to patient for follow up for disease management assessment. No answer. HIPAA compliant voice message left.   Plan: RN CM will attempt again in 3 weeks.  Jone Baseman, RN, MSN [place] Management Care Management Coordinator Direct Line [phone] Cell [phone] Toll Free: [phone]  Fax: [phone]

## 2020-10-10 ENCOUNTER — Other Ambulatory Visit: Payer: Self-pay | Admitting: Cardiovascular Disease

## 2020-10-10 ENCOUNTER — Encounter: Payer: Self-pay | Admitting: Cardiovascular Disease

## 2020-10-10 ENCOUNTER — Ambulatory Visit: Payer: Medicare HMO | Admitting: Cardiovascular Disease

## 2020-10-10 ENCOUNTER — Other Ambulatory Visit: Payer: Self-pay

## 2020-10-10 VITALS — BP 112/60 | HR 56 | Ht 62.0 in | Wt 134.2 lb

## 2020-10-10 DIAGNOSIS — I25708 Atherosclerosis of coronary artery bypass graft(s), unspecified, with other forms of angina pectoris: Secondary | ICD-10-CM | POA: Diagnosis not present

## 2020-10-10 DIAGNOSIS — R0602 Shortness of breath: Secondary | ICD-10-CM

## 2020-10-10 DIAGNOSIS — I4819 Other persistent atrial fibrillation: Secondary | ICD-10-CM | POA: Diagnosis not present

## 2020-10-10 DIAGNOSIS — I471 Supraventricular tachycardia: Secondary | ICD-10-CM | POA: Diagnosis not present

## 2020-10-10 DIAGNOSIS — I10 Essential (primary) hypertension: Secondary | ICD-10-CM

## 2020-10-10 DIAGNOSIS — I5032 Chronic diastolic (congestive) heart failure: Secondary | ICD-10-CM

## 2020-10-10 NOTE — Patient Instructions (Signed)
Medication Instructions:  No changes  If you need a refill on your cardiac medications before your next appointment, please call your pharmacy.    Lab work: No new labs needed   If you have labs (blood work) drawn today and your tests are completely normal, you will receive your results only by: . MyChart Message (if you have MyChart) OR . A paper copy in the mail If you have any lab test that is abnormal or we need to change your treatment, we will call you to review the results.   Testing/Procedures: No new testing needed   Follow-Up: At [facility], you and your health needs are our priority.  As part of our continuing mission to provide you with exceptional heart care, we have created designated Provider Care Teams.  These Care Teams include your primary Cardiologist (physician) and Advanced Practice Providers (APPs -  Physician Assistants and Nurse Practitioners) who all work together to provide you with the care you need, when you need it.  . You will need a follow up appointment in 6 months  . Providers on your designated Care Team:   . Christopher Berge, NP . Ryan Dunn, PA-C . Jacquelyn Visser, PA-C  Any Other Special Instructions Will Be Listed Below (If Applicable).  COVID-19 Vaccine Information can be found at: [email or web address] For questions related to vaccine distribution or appointments, please email [email or web address] or call [phone].     

## 2020-10-10 NOTE — Progress Notes (Signed)
Cardiology Office Note  Date:  10/10/2020   ID:  Andrea [name], DOB 09/12/1940, MRN [id number]  PCP:  Andrea Lukes, MD   Chief Complaint  Patient presents with  . 12 month follow up     "doing well." Medications reviewed by the patient verbally.     HPI:  Andrea [name] is a pleasant 80 year-old woman with history of  chronic bradycardia,  spinal stenosis,  peripheral vascular disease,  stents to her lower extremities status post CEA on the left, 40-59% residual disease on the right, 10/17 hyperlipidemia,  COPD, long smoking history for at least 30 years coronary artery disease and bypass surgery in August 04, 1997,  stent placement to distal RCA may 2013, In August 2017 she had 2 stents placed to vein graft to the RCA, also with stents to the mid left circumflex with resolution of her anginal symptoms cardiac catheterization 06/25/2016 occluded vein graft to the RCA, 60% left ostial subclavian vessel disease Shepresents for routine followup Of her coronary artery disease .  She is part of the bio freedom study  LOV 09/2019 Covid 04/2020 "very sick" layed on couch 3 1/2 wks Lost weight 15 pounds  Rare orthostasis BP at home: 110/60  Atrial fib last week , 20 min relaxed an went away, does not have frequent episodes, they do not last long Reports tolerating Tikosyn  Active at baseline Still dealing with affairs concerning her husband who passed away in August 05, 2019  Significant perspiration at night Etiology unclear  EKG personally reviewed by myself on todays visit Sins bradycardia rate 56 bpm  Other past medical history reviewed November 2020 COPD exacerbation treated with antibiotics prednisone nebulizers Lasix for several days  On lasix daily 20 mg  Husband with back [name] Spinal stimulator, hip [name]  Failed SVT ablation October 2019 Could be not induced  cardiac catheterization 06/25/2016 occluded vein graft to the RCA, 60% left ostial subclavian vessel  disease Medical management   Echo 08/11/2016 Normal EF, moderately elevated RVSP 57 mm Hg, moderate to severe TR  Carotid 10/17 40 to 59% disease on the right, <39% on the left  Denies any significant anginal symptoms previously stopped her Crestor, cholesterol from 144 up to 242 in May 30. 2017 Now back on crestor LDL 64, total chol 126  Cardiac catheterization results 1. Significant underlying three-vessel coronary artery disease with patent LIMA to LAD. The SVG to the RCA is now occluded proximally with left-to-right collaterals. The left circumflex stent is patent with no significant restenosis. 2. Mildly reduced LV systolic function with an EF of 50-55% and inferior wall hypokinesis. Mildly elevated left ventricular end-diastolic pressure. 3. Aortic arch angiogram was done to evaluate the stenosis in the left subclavian artery. It showed 60% heavily calcified stenosis in the ostium of the left subclavian artery. There was about 56-21 mm systolic gradient across the stenosis.  PMH:   has a past medical history of AAA (abdominal aortic aneurysm) ([place]) (01/2009), Acute bronchitis (03/20/2013; 08/05/15), Angina, Anosmia, Asthma, Baker's cyst of knee (01/30/2014), Basal cell carcinoma, Bradycardia, CAD (coronary artery disease), Cerebrovascular disease (01/2009), Chronic thoracic back [name], COPD (chronic obstructive pulmonary disease) ([place]), Depression, Dizziness, Dysrhythmia, GERD (gastroesophageal reflux disease), Heart murmur, Herniated lumbar disc without myelopathy, History of blood transfusion 04-Aug-1997), Hyperglycemia (10/23/2015), Hyperlipidemia, Hypertension, Hypothyroidism (09/30/2016), Medicare annual wellness visit, subsequent (07/31/2013), NSTEMI (non-ST elevated myocardial infarction) ([place]) (11/12), Osteoarthritis of back, Osteoporosis, Pneumonia ("several times"), Pulmonary embolism ([place]) 08-04-97), PVD (peripheral vascular disease) ([place]), Shortness of breath, Squamous carcinoma, Thyroid  disease, and  Unstable angina ([place]) (09/25/2015).  PSH:    Past Surgical History:  Procedure Laterality Date  . ABDOMINAL AORTIC ANEURYSM REPAIR     pt denies this hx on 01/02/2016  . BASAL CELL CARCINOMA EXCISION    . CARDIAC CATHETERIZATION N/A 01/02/2016   Procedure: Left Heart Cath and Coronary Angiography;  Surgeon: Andrea Hampshire, MD;  Location: [place] [facility];  Service: Cardiovascular;  Laterality: N/A;  . CARDIAC CATHETERIZATION  1996; 1999  . CARDIAC CATHETERIZATION N/A 06/25/2016   Procedure: Left Heart Cath and Cors/Grafts Angiography;  Surgeon: Andrea Hampshire, MD;  Location: [place] [facility];  Service: Cardiovascular;  Laterality: N/A;  . CARDIOVERSION N/A 10/19/2019   Procedure: CARDIOVERSION;  Surgeon: Andrea Pain, MD;  Location: [facility] ENDOSCOPY;  Service: Cardiovascular;  Laterality: N/A;  . CARDIOVERSION N/A 04/25/2020   Procedure: CARDIOVERSION;  Surgeon: Andrea Pain, MD;  Location[facility] ENDOSCOPY;  Service: Cardiovascular;  Laterality: N/A;  . CAROTID ENDARTERECTOMY Left 01/02/2011  . CATARACT EXTRACTION W/ INTRAOCULAR LENS  IMPLANT, BILATERAL    . CLOSED REDUCTION NASAL FRACTURE  11/2007  . CORONARY ANGIOPLASTY WITH STENT PLACEMENT  10/10/2011   drug eluting  to rc & saphenous  . CORONARY ARTERY BYPASS GRAFT  1999   "CABG X3"  . DILATION AND CURETTAGE OF UTERUS    . FEMORAL ARTERY STENT Bilateral   . FRACTURE SURGERY    . LEFT HEART CATHETERIZATION WITH CORONARY/GRAFT ANGIOGRAM N/A 04/22/2011   Procedure: LEFT HEART CATHETERIZATION WITH Andrea [name];  Surgeon: Andrea Artist, MD;  Location: [facility] CATH LAB;  Service: Cardiovascular;  Laterality: N/A;  . LEFT HEART CATHETERIZATION WITH CORONARY/GRAFT ANGIOGRAM N/A 10/10/2011   Procedure: LEFT HEART CATHETERIZATION WITH Andrea [name];  Surgeon: Andrea M Martinique, MD;  Location[facility] C[facility] LAB;  Service: Cardiovascular;  Laterality: N/A;  . NASAL SINUS SURGERY     twice  . SQUAMOUS CELL CARCINOMA EXCISION    . SVT  ABLATION N/A 03/19/2018   Procedure: SVT ABLATION;  Surgeon: Constance Haw, MD;  Location: [place] [facility];  Service: Cardiovascular;  Laterality: N/A;  . TUBAL LIGATION      Current Outpatient Medications  Medication Sig Dispense Refill  . acetaminophen (TYLENOL) 650 MG CR tablet Take 1,300 mg by mouth 2 (two) times daily.    Marland Kitchen albuterol (PROVENTIL) (2.5 MG/3ML) 0.083% nebulizer solution Take 3 mLs (2.5 mg total) by nebulization every 4 (four) hours as needed for wheezing or shortness of breath. Dx:J44.9 75 mL 12  . albuterol (PROVENTIL) (5 MG/ML) 0.5% nebulizer solution Take 0.5 mLs (2.5 mg total) by nebulization every 6 (six) hours as needed for wheezing or shortness of breath. 20 mL 2  . albuterol (VENTOLIN HFA) 108 (90 Base) MCG/ACT inhaler INHALE 2 PUFFS INTO THE LUNGS EVERY 6 (SIX) HOURS AS NEEDED FOR WHEEZING. 18 g 5  . apixaban (ELIQUIS) 2.5 MG TABS tablet Take 1 tablet (2.5 mg total) by mouth 2 (two) times daily. 60 tablet 5  . azelastine (ASTELIN) 0.1 % nasal spray Place 2 sprays into both nostrils at bedtime as needed for rhinitis. 30 mL 3  . Biotin 5 MG TABS Take 5 mg by mouth daily.     . Calcium Carbonate-Vitamin D (CALCIUM + D PO) Take 1 tablet by mouth 2 (two) times a week.     . Cholecalciferol (VITAMIN D3) 2000 units TABS Take 2,000 Units by mouth daily.     . clopidogrel (PLAVIX) 75 MG tablet TAKE  1 TABLET BY MOUTH ONCE A DAY 90 tablet 0  . diltiazem (CARDIZEM CD) 240 MG 24 hr capsule TAKE 1 CAPSULE BY MOUTH ONCE DAILY 90 capsule 2  . diltiazem (CARDIZEM) 30 MG tablet Take one tablet by mouth every 4 hours AS NEEDED for A-fib HR > 100 as long as BP > 100. 45 tablet 0  . dofetilide (TIKOSYN) 125 MCG capsule Take 1 capsule (125 mcg total) by mouth 2 (two) times daily. 60 capsule 6  . ezetimibe (ZETIA) 10 MG tablet TAKE 1 TABLET BY MOUTH DAILY 90 tablet 0  . HYDROcodone-acetaminophen (NORCO) 10-325 MG tablet Take 1 tablet by mouth every 6 (six) hours as needed for  severe [name].     Marland Kitchen ipratropium (ATROVENT) 0.02 % nebulizer solution Take 2.5 mLs (0.5 mg total) by nebulization 4 (four) times daily. 75 mL 2  . isosorbide mononitrate (IMDUR) 60 MG 24 hr tablet TAKE 1 TABLET BY MOUTH TWICE A DAY 180 tablet 1  . Krill Oil 500 MG CAPS Take 500 mg by mouth daily.     Marland Kitchen levothyroxine (SYNTHROID) 50 MCG tablet Take 1 tablet (50 mcg total) by mouth daily. 90 tablet 2  . neomycin-polymyxin b-dexamethasone (MAXITROL) 3.5-10000-0.1 SUSP Place 1 drop into both eyes every 6 (six) hours.    . nitroGLYCERIN (NITROSTAT) 0.4 MG SL tablet Place 1 tablet (0.4 mg total) under the tongue every 5 (five) minutes as needed for chest [name]. 25 tablet 3  . omeprazole (PRILOSEC) 20 MG capsule Take 1 capsule (20 mg total) by mouth daily. 90 capsule 3  . Polyethyl Glycol-Propyl Glycol (SYSTANE OP) Place 1 drop into both eyes 2 (two) times daily as needed (dry/irritated eyes.).     Marland Kitchen potassium chloride (KLOR-CON) 10 MEQ tablet Take 1 tablet (10 mEq total) by mouth daily. And take 2 tablets (29meq) on days you take furosemide 135 tablet 2  . predniSONE (STERAPRED UNI-PAK 21 TAB) 10 MG (21) TBPK tablet As directed 21 tablet 0  . rosuvastatin (CRESTOR) 40 MG tablet TAKE 1 TABLET BY MOUTH ONCE DAILY 90 tablet 0  . sertraline (ZOLOFT) 100 MG tablet Take 1 tablet (100 mg total) by mouth daily. 90 tablet 2  . TRELEGY ELLIPTA 100-62.5-25 MCG/INH AEPB INAHLE 1 PUFF INTO THE LUNGS DAILY 60 each 5  . zinc gluconate 50 MG tablet Take 50 mg by mouth daily.    . furosemide (LASIX) 20 MG tablet Take 1 tablet (20 mg total) by mouth as directed. Take 1 tablet (20 mg) daily and extra 1 tablet (20 mg) at 2 PM for shortness of breath or abdominal swelling 180 tablet 2   No current facility-administered medications for this visit.     Allergies:   Erythromycin, Tape, Meperidine, Shellfish allergy, Ciprofloxacin, Codeine, Latex, and Penicillins   Social History:  The patient  reports that she quit smoking  about 27 years ago. Her smoking use included cigarettes. She has a 20.00 pack-year smoking history. She has never used smokeless tobacco. She reports current alcohol use. She reports that she does not use drugs.   Family History:   family history includes Alcohol abuse in her brother; Aneurysm in her brother; Angina in her father; Cirrhosis in her maternal grandfather; Colon cancer (age of onset: 45) in her father; Diabetes in her maternal grandmother and mother; Heart attack in her brother and brother; Heart attack (age of onset: 21) in her mother; Heart disease in her brother, brother, and father; Hyperlipidemia in her daughter; Hypertension in her  brother and father; Hypothyroidism in her sister; Lung cancer in her father and sister; Obesity in her daughter and son; Stroke in her maternal grandmother and paternal grandmother.   Review of Systems: Review of Systems  Constitutional: Positive for malaise/fatigue.  HENT: Negative.   Respiratory: Positive for shortness of breath.   Cardiovascular: Negative.   Gastrointestinal: Negative.   Musculoskeletal: Negative.   Neurological: Negative.   Psychiatric/Behavioral: Negative.   All other systems reviewed and are negative.   PHYSICAL EXAM: VS:  BP 112/60 (BP Location: Left Arm, Patient Position: Sitting, Cuff Size: Normal)   Pulse (!) 56   Ht 5\' 2"  (1.575 m)   Wt 134 lb 4 oz (60.9 kg)   SpO2 98%   BMI 24.55 kg/m  , BMI Body mass index is 24.55 kg/m. Constitutional:  oriented to person, place, and time. No distress.  HENT:  Head: Grossly normal Eyes:  no discharge. No scleral icterus.  Neck: No JVD, no carotid bruits  Cardiovascular: Regular rate and rhythm, no murmurs appreciated Pulmonary/Chest: Clear to auscultation bilaterally, no wheezes or rails Abdominal: Soft.  no distension.  no tenderness.  Musculoskeletal: Normal range of motion Neurological:  normal muscle tone. Coordination normal. No atrophy Skin: Skin warm and  dry Psychiatric: normal affect, pleasant  Recent Labs: 04/10/2020: ALT 12; Hemoglobin 10.7; Platelets 146 06/22/2020: BUN 9; Creatinine, Ser 0.78; Magnesium 1.8; Potassium 3.9; Sodium 143    Lipid Panel Lab Results  Component Value Date   CHOL 119 06/13/2019   HDL 32.10 (L) 06/13/2019   LDLCALC 78 12/01/2017   TRIG 217.0 (H) 06/13/2019    Wt Readings from Last 3 Encounters:  10/10/20 134 lb 4 oz (60.9 kg)  06/22/20 130 lb (59 kg)  05/24/20 132 lb (59.9 kg)     ASSESSMENT AND PLAN:  COPD exacerbation No recent COPD exacerbation  Mixed hyperlipidemia Zetia Crestor Numbers well controlled, at goal  Essential hypertension Blood pressure is well controlled on today's visit. No changes made to the medications.  SVT Followed by  EP in [place] Denies having significant symptoms since diltiazem was increased recently  Coronary artery disease involving coronary bypass graft of native heart with angina pectoris [facility])  Currently with no symptoms of angina. No further workup at this time. Continue current medication regimen.  Bilateral carotid artery stenosis Endarterectomy on the left, <39% b/l, Last seen 2019 Cholesterol at goal, periodic ultrasound  Pulmonary HTN underlying lung disease, 30 years of smoking Most recent echocardiogram November 2021 normal right heart pressures estimated  Non-rheumatic tricuspid valve insufficiency Significant regurgitation on echo Relatively asymptomatic, no intervention planned at this time May show improvement by maintaining normal sinus rhythm   Total encounter time more than 25 minutes  Greater than 50% was spent in counseling and coordination of care with the patient    No orders of the defined types were placed in this encounter.    Signed, Esmond Plants, M.D., Ph.D. 10/10/2020  [place], [place]

## 2020-10-21 DIAGNOSIS — J449 Chronic obstructive pulmonary disease, unspecified: Secondary | ICD-10-CM | POA: Diagnosis not present

## 2020-10-21 DIAGNOSIS — I270 Primary pulmonary hypertension: Secondary | ICD-10-CM | POA: Diagnosis not present

## 2020-10-21 DIAGNOSIS — I509 Heart failure, unspecified: Secondary | ICD-10-CM | POA: Diagnosis not present

## 2020-10-26 ENCOUNTER — Other Ambulatory Visit: Payer: Self-pay

## 2020-10-26 NOTE — Patient Outreach (Signed)
[facility]) Care Management  10/26/2020  Andrea [name] 11/16/1940 [id number]   Telephone call to patient for follow up for disease management assessment. No answer. HIPAA compliant voice message left.   Plan: RN CM will close case.  Jone Baseman, RN, MSN [facility] Management Care Management Coordinator Direct Line [phone] Cell [phone] Toll Free: [phone]  Fax: [phone]

## 2020-11-09 ENCOUNTER — Other Ambulatory Visit: Payer: Self-pay | Admitting: Cardiovascular Disease

## 2020-11-15 DIAGNOSIS — D225 Melanocytic nevi of trunk: Secondary | ICD-10-CM | POA: Diagnosis not present

## 2020-11-15 DIAGNOSIS — L821 Other seborrheic keratosis: Secondary | ICD-10-CM | POA: Diagnosis not present

## 2020-11-15 DIAGNOSIS — L738 Other specified follicular disorders: Secondary | ICD-10-CM | POA: Diagnosis not present

## 2020-11-15 DIAGNOSIS — L814 Other melanin hyperpigmentation: Secondary | ICD-10-CM | POA: Diagnosis not present

## 2020-11-15 DIAGNOSIS — Z85828 Personal history of other malignant neoplasm of skin: Secondary | ICD-10-CM | POA: Diagnosis not present

## 2020-11-15 DIAGNOSIS — L720 Epidermal cyst: Secondary | ICD-10-CM | POA: Diagnosis not present

## 2020-11-15 DIAGNOSIS — L570 Actinic keratosis: Secondary | ICD-10-CM | POA: Diagnosis not present

## 2020-11-21 DIAGNOSIS — J449 Chronic obstructive pulmonary disease, unspecified: Secondary | ICD-10-CM | POA: Diagnosis not present

## 2020-11-21 DIAGNOSIS — I509 Heart failure, unspecified: Secondary | ICD-10-CM | POA: Diagnosis not present

## 2020-11-21 DIAGNOSIS — I270 Primary pulmonary hypertension: Secondary | ICD-10-CM | POA: Diagnosis not present

## 2020-11-28 ENCOUNTER — Other Ambulatory Visit: Payer: Self-pay | Admitting: Physician Assistant

## 2020-12-18 NOTE — Progress Notes (Signed)
PCP:  Mosie Lukes, MD Primary Cardiologist: None Electrophysiologist: Will Meredith Leeds, MD   Andrea [name] is a 80 y.o. female seen today for Will Meredith Leeds, MD for routine electrophysiology followup.  Since last being seen in our clinic the patient reports doing well overall.   She has multiple non-cardiac complaints today. MSK type intercostal pain since coughing that has been worse since having COVID in January, "chills" in the afternoon for the past 3-4 weeks to the point of needing to put on a sweater, and pain in her Left groin/thigh at night. She has palpitations 1-2 times a month for 5-10 minutes, similar to her previous AF symptoms, but all self resolve. DOE at baseline with COPD.   Past Medical History:  Diagnosis Date   AAA (abdominal aortic aneurysm) ([place]) 01/2009   AAA (2.8 x 3.0)  moderate RAS (left); 2.7 x 2.7 cm (07/11/05)   Acute bronchitis 03/20/2013; 2017   Angina    Anosmia    Asthma    Baker's cyst of knee 01/30/2014   Basal cell carcinoma    "back and left arm"   Bradycardia    Metoprolol stopped 08/2011   CAD (coronary artery disease)    s/CABG (reports IMA and 2 SVGs) back in 1999  Myoview normal 3/10; s/p PCI with DES to PL branch of distal RCA 09/2011; PCI +DES to SVG-RCA, PCI + DES to mid LCx 12/2015   Cerebrovascular disease 01/2009   carotid u/s  R 0-39%   L 60-79%   Chronic thoracic back pain    COPD (chronic obstructive pulmonary disease) (HCC)    Depression    Dizziness    Dysrhythmia    hx of sinus brady   GERD (gastroesophageal reflux disease)    Heart murmur    Herniated lumbar disc without myelopathy    History of blood transfusion 1999   "when I had the bypass; had a PE"   Hyperglycemia 10/23/2015   Hyperlipidemia    Hypertension    Hypothyroidism 09/30/2016   Medicare annual wellness visit, subsequent 07/31/2013   NSTEMI (non-ST elevated myocardial infarction) ([place]) 11/12   Cath showed atretic IMA graft to the LAD, SVG to PD was  patent but the continuation of this graft to the PL branch was occluded; there were L to R collaterals; Mid LAD had a 60 to 70% stenosis. She has been treated medically.  Neg Myoview 05/2011   Osteoarthritis of back    Osteoporosis    Pneumonia "several times"   Pulmonary embolism ([place]) 1999   "after my bypass"   PVD (peripheral vascular disease) ([place])    Shortness of breath    Squamous carcinoma    "nose"   Thyroid disease    Hypothyroid   Unstable angina ([place]) 09/25/2015   Past Surgical History:  Procedure Laterality Date   ABDOMINAL AORTIC ANEURYSM REPAIR     pt denies this hx on 01/02/2016   BASAL CELL CARCINOMA EXCISION     CARDIAC CATHETERIZATION N/A 01/02/2016   Procedure: Left Heart Cath and Coronary Angiography;  Surgeon: Wellington Hampshire, MD;  Location: [facility];  Service: Cardiovascular;  Laterality: N/A;   CARDIAC CATHETERIZATION  1996; 1999   CARDIAC CATHETERIZATION N/A 06/25/2016   Procedure: Left Heart Cath and Cors/Grafts Angiography;  Surgeon: Wellington Hampshire, MD;  Location: [facility];  Service: Cardiovascular;  Laterality: N/A;   CARDIOVERSION N/A 10/19/2019   Procedure: CARDIOVERSION;  Surgeon: Jerline Pain, MD;  Location: [facility];  Service: Cardiovascular;  Laterality: N/A;   CARDIOVERSION N/A 04/25/2020   Procedure: CARDIOVERSION;  Surgeon: Jerline Pain, MD;  Location: [facility];  Service: Cardiovascular;  Laterality: N/A;   CAROTID ENDARTERECTOMY Left 01/02/2011   CATARACT EXTRACTION W/ INTRAOCULAR LENS  IMPLANT, BILATERAL     CLOSED REDUCTION NASAL FRACTURE  11/2007   CORONARY ANGIOPLASTY WITH STENT PLACEMENT  10/10/2011   drug eluting  to rc & saphenous   CORONARY ARTERY BYPASS GRAFT  1999   "CABG X3"   DILATION AND CURETTAGE OF UTERUS     FEMORAL ARTERY STENT Bilateral    FRACTURE SURGERY     LEFT HEART CATHETERIZATION WITH CORONARY/GRAFT ANGIOGRAM N/A 04/22/2011   Procedure: LEFT HEART CATHETERIZATION WITH Beatrix Fetters;   Surgeon: Jolaine Artist, MD;  Location: [facility];  Service: Cardiovascular;  Laterality: N/A;   LEFT HEART CATHETERIZATION WITH CORONARY/GRAFT ANGIOGRAM N/A 10/10/2011   Procedure: LEFT HEART CATHETERIZATION WITH Beatrix Fetters;  Surgeon: Peter M Martinique, MD;  Location: [facility] CATH LAB;  Service: Cardiovascular;  Laterality: N/A;   NASAL SINUS SURGERY     twice   SQUAMOUS CELL CARCINOMA EXCISION     SVT ABLATION N/A 03/19/2018   Procedure: SVT ABLATION;  Surgeon: Constance Haw, MD;  Location: [facility];  Service: Cardiovascular;  Laterality: N/A;   TUBAL LIGATION      Current Outpatient Medications  Medication Sig Dispense Refill   acetaminophen (TYLENOL) 650 MG CR tablet Take 1,300 mg by mouth 2 (two) times daily.     albuterol (PROVENTIL) (2.5 MG/3ML) 0.083% nebulizer solution Take 3 mLs (2.5 mg total) by nebulization every 4 (four) hours as needed for wheezing or shortness of breath. Dx:J44.9 75 mL 12   albuterol (PROVENTIL) (5 MG/ML) 0.5% nebulizer solution Take 0.5 mLs (2.5 mg total) by nebulization every 6 (six) hours as needed for wheezing or shortness of breath. 20 mL 2   apixaban (ELIQUIS) 2.5 MG TABS tablet Take 1 tablet (2.5 mg total) by mouth 2 (two) times daily. 60 tablet 5   azelastine (ASTELIN) 0.1 % nasal spray Place 2 sprays into both nostrils at bedtime as needed for rhinitis. 30 mL 3   Biotin 5 MG TABS Take 5 mg by mouth daily.      Calcium Carbonate-Vitamin D (CALCIUM + D PO) Take 1 tablet by mouth 2 (two) times a week.      Cholecalciferol (VITAMIN D3) 2000 units TABS Take 2,000 Units by mouth daily.      clopidogrel (PLAVIX) 75 MG tablet TAKE 1 TABLET BY MOUTH ONCE A DAY 90 tablet 0   diltiazem (CARDIZEM CD) 240 MG 24 hr capsule TAKE 1 CAPSULE BY MOUTH ONCE DAILY 90 capsule 2   diltiazem (CARDIZEM) 30 MG tablet Take one tablet by mouth every 4 hours AS NEEDED for A-fib HR > 100 as long as BP > 100. 45 tablet 0   dofetilide (TIKOSYN) 125 MCG  capsule TAKE 1 CAPSULE BY MOUTH TWICE DAILY 60 capsule 3   ezetimibe (ZETIA) 10 MG tablet TAKE 1 TABLET BY MOUTH DAILY 90 tablet 0   furosemide (LASIX) 20 MG tablet Take 1 tablet (20 mg total) by mouth as directed. Take 1 tablet (20 mg) daily and extra 1 tablet (20 mg) at 2 PM for shortness of breath or abdominal swelling 180 tablet 2   HYDROcodone-acetaminophen (NORCO) 10-325 MG tablet Take 1 tablet by mouth every 6 (six) hours as needed for severe pain.  ipratropium (ATROVENT) 0.02 % nebulizer solution Take 2.5 mLs (0.5 mg total) by nebulization 4 (four) times daily. 75 mL 2   isosorbide mononitrate (IMDUR) 60 MG 24 hr tablet TAKE 1 TABLET BY MOUTH TWICE A DAY 180 tablet 2   Krill Oil 500 MG CAPS Take 500 mg by mouth daily.      levothyroxine (SYNTHROID) 50 MCG tablet Take 1 tablet (50 mcg total) by mouth daily. 90 tablet 2   nitroGLYCERIN (NITROSTAT) 0.4 MG SL tablet Place 1 tablet (0.4 mg total) under the tongue every 5 (five) minutes as needed for chest pain. 25 tablet 3   omeprazole (PRILOSEC) 20 MG capsule Take 1 capsule (20 mg total) by mouth daily. 90 capsule 3   Polyethyl Glycol-Propyl Glycol (SYSTANE OP) Place 1 drop into both eyes 2 (two) times daily as needed (dry/irritated eyes.).      potassium chloride (KLOR-CON) 10 MEQ tablet Take 1 tablet (10 mEq total) by mouth daily. And take 2 tablets (78meq) on days you take furosemide 135 tablet 2   rosuvastatin (CRESTOR) 40 MG tablet TAKE 1 TABLET BY MOUTH ONCE DAILY 90 tablet 0   sertraline (ZOLOFT) 100 MG tablet Take 1 tablet (100 mg total) by mouth daily. 90 tablet 2   TRELEGY ELLIPTA 100-62.5-25 MCG/INH AEPB INAHLE 1 PUFF INTO THE LUNGS DAILY 60 each 5   zinc gluconate 50 MG tablet Take 50 mg by mouth daily.     albuterol (VENTOLIN HFA) 108 (90 Base) MCG/ACT inhaler INHALE 2 PUFFS INTO THE LUNGS EVERY 6 (SIX) HOURS AS NEEDED FOR WHEEZING. (Patient not taking: Reported on 12/19/2020) 18 g 5   neomycin-polymyxin b-dexamethasone  (MAXITROL) 3.5-10000-0.1 SUSP Place 1 drop into both eyes every 6 (six) hours. (Patient not taking: Reported on 12/19/2020)     predniSONE (STERAPRED UNI-PAK 21 TAB) 10 MG (21) TBPK tablet As directed (Patient not taking: Reported on 12/19/2020) 21 tablet 0   No current facility-administered medications for this visit.    Allergies  Allergen Reactions   Erythromycin Other (See Comments)    Made the patient's tongue "burn"   Tape Rash and Other (See Comments)    PLEASE USE PAPER TAPE!!   Meperidine Nausea And Vomiting   Shellfish Allergy Nausea And Vomiting   Ciprofloxacin Rash and Other (See Comments)    Rash from IV   Codeine Nausea And Vomiting   Latex Rash and Other (See Comments)    Made the patient's skin "burn," also   Penicillins Rash and Other (See Comments)    Has patient had a PCN reaction causing immediate rash, facial/tongue/throat swelling, SOB or lightheadedness with hypotension: Unk Has patient had a PCN reaction causing severe rash involving mucus membranes or skin necrosis: No Has patient had a PCN reaction that required hospitalization: No Has patient had a PCN reaction occurring within the last 10 years: No If all of the above answers are "NO", then may proceed with Cephalosporin use.     Social History   Socioeconomic History   Marital status: Married    Spouse name: Not on file   Number of children: 5   Years of education: Not on file   Highest education level: Not on file  Occupational History   Occupation: Retired    Fish farm manager: RETIRED    Comment: former  Marine scientist  Tobacco Use   Smoking status: Former    Packs/day: 0.50    Years: 40.00    Pack years: 20.00    Types: Cigarettes  Quit date: 01/19/1993    Years since quitting: 27.9   Smokeless tobacco: Never  Vaping Use   Vaping Use: Never used  Substance and Sexual Activity   Alcohol use: Yes    Comment: 01/02/2016 "might drink a glass of wine socially q 3-4 months"   Drug use: No   Sexual  activity: Not Currently    Birth control/protection: Post-menopausal  Other Topics Concern   Not on file  Social History Narrative   Married with 5 children   Social Determinants of Health   Financial Resource Strain: Not on file  Food Insecurity: Not on file  Transportation Needs: Not on file  Physical Activity: Not on file  Stress: Not on file  Social Connections: Not on file  Intimate Partner Violence: Not on file     Review of Systems: General: No chills, fever, night sweats or weight changes  Cardiovascular:  No chest pain, dyspnea on exertion, edema, orthopnea, palpitations, paroxysmal nocturnal dyspnea Dermatological: No rash, lesions or masses Respiratory: No cough, dyspnea Urologic: No hematuria, dysuria Abdominal: No nausea, vomiting, diarrhea, bright red blood per rectum, melena, or hematemesis Neurologic: No visual changes, weakness, changes in mental status All other systems reviewed and are otherwise negative except as noted above.  Physical Exam: Vitals:   12/19/20 1127  BP: 138/76  Pulse: (!) 59  SpO2: 96%  Weight: 134 lb 4.8 oz (60.9 kg)  Height: 5\' 2"  (1.575 m)    GEN- The patient is well appearing, alert and oriented x 3 today.   HEENT: normocephalic, atraumatic; sclera clear, conjunctiva pink; hearing intact; oropharynx clear; neck supple, no JVP Lymph- no cervical lymphadenopathy Lungs- Clear to ausculation bilaterally, normal work of breathing.  No wheezes, rales, rhonchi Heart- Regular rate and rhythm, no murmurs, rubs or gallops, PMI not laterally displaced GI- soft, non-tender, non-distended, bowel sounds present, no hepatosplenomegaly Extremities- no clubbing, cyanosis, or edema; DP/PT/radial pulses 2+ bilaterally MS- no significant deformity or atrophy Skin- warm and dry, no rash or lesion Psych- euthymic mood, full affect Neuro- strength and sensation are intact  EKG is ordered. Personal review of EKG from today shows sinus bradycardia at  59 bpm with stable QTc 410-420 ms  Additional studies reviewed include: Previous EP office notes  Assessment and Plan:  1. Persistent AFib Continue eliquis 2.5 mg BID for CHA2DS2-VASc of at least 5. Weight has been borderline.  Continue tikosyn 125 mcg BID with stable QTc by EKG today.  Labs today.    2. CAD Continue Plavix (also has PVD), statin, nitrate   3. COPD SOB with moderate exertion at baseline.   Chronic cough with intercostal tenderness. Worse since COVID. No clear indication for CXR at this time.    4. HTN Stable on current medications.   5. Chills / ? Cold intolerance No other s/s of infection CBC today.  Update Thyroid function tests today  6. Left thigh/groin discomfort Worse at night in bed. Suspect most likely RLS type symptoms without exertional symptoms. Less likely PCD with h/o femoral stents.  Have encouraged PCP follow up.  Have encouraged close PCP follow up for non-cardiac complaints. Labwork today to help clarify.   Shirley Friar, PA-C  12/19/20 11:40 AM

## 2020-12-19 ENCOUNTER — Ambulatory Visit: Payer: Medicare HMO | Admitting: Physician Assistant

## 2020-12-19 ENCOUNTER — Other Ambulatory Visit: Payer: Self-pay

## 2020-12-19 ENCOUNTER — Ambulatory Visit: Payer: Medicare HMO | Admitting: Student

## 2020-12-19 ENCOUNTER — Encounter: Payer: Self-pay | Admitting: Student

## 2020-12-19 VITALS — BP 138/76 | HR 59 | Ht 62.0 in | Wt 134.3 lb

## 2020-12-19 DIAGNOSIS — I4819 Other persistent atrial fibrillation: Secondary | ICD-10-CM

## 2020-12-19 DIAGNOSIS — I5032 Chronic diastolic (congestive) heart failure: Secondary | ICD-10-CM | POA: Diagnosis not present

## 2020-12-19 DIAGNOSIS — I25708 Atherosclerosis of coronary artery bypass graft(s), unspecified, with other forms of angina pectoris: Secondary | ICD-10-CM

## 2020-12-19 DIAGNOSIS — I10 Essential (primary) hypertension: Secondary | ICD-10-CM | POA: Diagnosis not present

## 2020-12-19 DIAGNOSIS — E039 Hypothyroidism, unspecified: Secondary | ICD-10-CM | POA: Diagnosis not present

## 2020-12-19 DIAGNOSIS — H0288B Meibomian gland dysfunction left eye, upper and lower eyelids: Secondary | ICD-10-CM | POA: Diagnosis not present

## 2020-12-19 DIAGNOSIS — R0602 Shortness of breath: Secondary | ICD-10-CM

## 2020-12-19 LAB — CBC
Hematocrit: 39.6 % (ref 34.0–46.6)
Hemoglobin: 12.6 g/dL (ref 11.1–15.9)
MCH: 25 pg — ABNORMAL LOW (ref 26.6–33.0)
MCHC: 31.8 g/dL (ref 31.5–35.7)
MCV: 79 fL (ref 79–97)
Platelets: 273 10*3/uL (ref 150–450)
RBC: 5.04 x10E6/uL (ref 3.77–5.28)
RDW: 15 % (ref 11.7–15.4)
WBC: 9.1 10*3/uL (ref 3.4–10.8)

## 2020-12-19 LAB — BASIC METABOLIC PANEL
BUN/Creatinine Ratio: 14 (ref 12–28)
BUN: 12 mg/dL (ref 8–27)
CO2: 26 mmol/L (ref 20–29)
Calcium: 9.5 mg/dL (ref 8.7–10.3)
Chloride: 99 mmol/L (ref 96–106)
Creatinine, Ser: 0.85 mg/dL (ref 0.57–1.00)
Glucose: 90 mg/dL (ref 65–99)
Potassium: 4.8 mmol/L (ref 3.5–5.2)
Sodium: 139 mmol/L (ref 134–144)
eGFR: 69 mL/min/{1.73_m2} (ref >59)

## 2020-12-19 LAB — T3, FREE: T3, Free: 1.9 pg/mL — ABNORMAL LOW (ref 2.0–4.4)

## 2020-12-19 LAB — MAGNESIUM: Magnesium: 2 mg/dL (ref 1.6–2.3)

## 2020-12-19 LAB — TSH: TSH: 3.74 u[IU]/mL (ref 0.450–4.500)

## 2020-12-19 LAB — T4, FREE: Free T4: 1.11 ng/dL (ref 0.82–1.77)

## 2020-12-19 NOTE — Patient Instructions (Signed)
Medication Instructions:  Your physician recommends that you continue on your current medications as directed. Please refer to the Current Medication list given to you today.  *If you need a refill on your cardiac medications before your next appointment, please call your pharmacy*   Lab Work: TODAY: BMET, CBC, TSH, Mg, Free T3, Free T4  If you have labs (blood work) drawn today and your tests are completely normal, you will receive your results only by: [place] (if you have MyChart) OR A paper copy in the mail If you have any lab test that is abnormal or we need to change your treatment, we will call you to review the results.   Follow-Up: At [facility], you and your health needs are our priority.  As part of our continuing mission to provide you with exceptional heart care, we have created designated Provider Care Teams.  These Care Teams include your primary Cardiologist (physician) and Advanced Practice Providers (APPs -  Physician Assistants and Nurse Practitioners) who all work together to provide you with the care you need, when you need it.   Your next appointment:   6 month(s)  The format for your next appointment:   In Person  Provider:   You may see Will Meredith Leeds, MD or one of the following Advanced Practice Providers on your designated Care Team:   Tommye Standard, [place] Legrand Como "[facility]" [place], [place]

## 2020-12-21 DIAGNOSIS — J449 Chronic obstructive pulmonary disease, unspecified: Secondary | ICD-10-CM | POA: Diagnosis not present

## 2020-12-21 DIAGNOSIS — I509 Heart failure, unspecified: Secondary | ICD-10-CM | POA: Diagnosis not present

## 2020-12-21 DIAGNOSIS — I270 Primary pulmonary hypertension: Secondary | ICD-10-CM | POA: Diagnosis not present

## 2020-12-24 ENCOUNTER — Telehealth: Payer: Self-pay | Admitting: Emergency Medicine

## 2020-12-25 NOTE — Telephone Encounter (Signed)
Called [facility] and spoke with Merrilee Seashore. Merrilee Seashore stated that a recertification form for pt's O2 was faxed to the office that needs to be signed. Pt is scheduled for an OV 8/11 with RB.  Per Merrilee Seashore, we do not need to requalify pt for her O2 as we can use the same info from last OV on the form and all that needs to be done is have it signed and faxed back to [place]. Merrilee Seashore said that if we wanted to do a requalification walk on pt, we could but it was not necessary.  Routing to [place] as an Pharmacist, hospital and also to be on the lookout for the form.

## 2021-01-03 ENCOUNTER — Encounter: Payer: Self-pay | Admitting: Emergency Medicine

## 2021-01-03 ENCOUNTER — Other Ambulatory Visit: Payer: Self-pay

## 2021-01-03 ENCOUNTER — Ambulatory Visit: Payer: Medicare HMO | Admitting: Emergency Medicine

## 2021-01-03 VITALS — BP 128/70 | HR 58 | Temp 97.7°F | Ht 62.0 in | Wt 134.4 lb

## 2021-01-03 DIAGNOSIS — J449 Chronic obstructive pulmonary disease, unspecified: Secondary | ICD-10-CM | POA: Diagnosis not present

## 2021-01-03 DIAGNOSIS — J9611 Chronic respiratory failure with hypoxia: Secondary | ICD-10-CM | POA: Diagnosis not present

## 2021-01-03 DIAGNOSIS — G4733 Obstructive sleep apnea (adult) (pediatric): Secondary | ICD-10-CM

## 2021-01-03 NOTE — Progress Notes (Signed)
Subjective:    [name], female    DOB: [birth year], 80 y.o.   MRN: [id number]  HPI 80 year old former smoker (30 pack years) with a history of COPD, OSA on CPAP.  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  She had a pulmonary embolism after her CABG.  She is changing physicians from Dr. Mortimer Fries from [facility].  She is on Stiolto. Uses albuterol nebs 1-2x a day with some relief. 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  She has oxygen available use which she uses with most exertion. Wears her CPAP reliably, still feels good benefit from it. She flares about once a year. She was once on an extended course of prednisone after her CABG and PE's, ? For pleuritic pain. Minimal cough. She does wheeze, most days, often in the am - responds to SABA.   ROV 01/03/21 --follow-up visit for 80 year old former smoker with COPD.  She also has OSA and uses CPAP.  Other past medical history significant for CAD/CABG, hypertension, hypothyroidism, peripheral vascular disease, pulmonary embolism.  She has documented exertional hypoxemia and has been prescribed 2 L/min to use with exertion, marginal compliance.  She follows up today, reports that she has exertional SOB, happens with walking 150ft on a flat ground, better w rest. Some intermittent cough, depends on the weather and heat, allergy exposures. She is using her albuterol a few times a day for the last 2 weeks, again related to the heat. She is not using her o2, hasn';t used it for months.  She had COVID-19 in January, feels that she recovered mostly but still feels some change in taste, smells. She is managed on trelegy, she is unsure whether it helps her much.  Marginal CPAP compliance since last time - having dry eyes and trouble w mask since COVID. She wants to try nasal pillows . Will need a chin strap.     Review of Systems As per HPI  Past Medical History:  Diagnosis Date   AAA (abdominal aortic aneurysm) ([place]) 01/2009   AAA (2.8 x 3.0)  moderate RAS (left); 2.7 x 2.7 cm (07/11/05)   Acute bronchitis 03/20/2013; 2017   Angina    Anosmia    Asthma    Baker's cyst of knee 01/30/2014   Basal cell carcinoma    "back and left arm"   Bradycardia    Metoprolol stopped 08/2011   CAD (coronary artery disease)    s/CABG (reports IMA and 2 SVGs) back in 1999  Myoview normal 3/10; s/p PCI with DES to PL branch of distal RCA 09/2011; PCI +DES to SVG-RCA, PCI + DES to mid LCx 12/2015   Cerebrovascular disease 01/2009   carotid u/s  R 0-39%   L 60-79%   Chronic thoracic back pain    COPD (chronic obstructive pulmonary disease) (HCC)    Depression    Dizziness    Dysrhythmia    hx of sinus brady   GERD (gastroesophageal reflux disease)    Heart murmur    Herniated lumbar disc without myelopathy    History of blood transfusion 1999   "when I had the bypass; had a PE"   Hyperglycemia 10/23/2015   Hyperlipidemia    Hypertension    Hypothyroidism 09/30/2016   Medicare annual wellness visit, subsequent 07/31/2013   NSTEMI (non-ST elevated myocardial infarction) [facility]) 11/12   Cath showed atretic IMA graft  to the LAD, SVG to PD was patent but the continuation of this graft to the PL branch was occluded; there were L to R collaterals; Mid LAD had a 60 to 70% stenosis. She has been treated medically.  Neg Myoview 05/2011   Osteoarthritis of back    Osteoporosis    Pneumonia "several times"   Pulmonary embolism ([place]) 1999   "after my bypass"   PVD (peripheral vascular disease) ([place])    Shortness of breath    Squamous carcinoma    "nose"   Thyroid disease    Hypothyroid   Unstable angina ([place]) 09/25/2015     Family History  Problem Relation Age of Onset   Heart attack Mother 56   Diabetes Mother    Colon cancer Father 66   Lung cancer Father        smoked   Heart disease Father    Hypertension  Father    Angina Father    Lung cancer Sister        smoked   Hypothyroidism Sister    Hypertension Brother    Heart attack Brother    Heart disease Brother        PTCA with Stent   Heart attack Brother        CABG   Heart disease Brother        CABG with 1 bypass   Aneurysm Brother        brain   Alcohol abuse Brother    Hyperlipidemia Daughter    Diabetes Maternal Grandmother    Stroke Maternal Grandmother    Cirrhosis Maternal Grandfather    Stroke Paternal Grandmother    Obesity Daughter    Obesity Son      Social History   Socioeconomic History   Marital status: Married    Spouse name: Not on file   Number of children: 5   Years of education: Not on file   Highest education level: Not on file  Occupational History   Occupation: Retired    Fish farm manager: RETIRED    Comment: former  Marine scientist  Tobacco Use   Smoking status: Former    Packs/day: 0.50    Years: 40.00    Pack years: 20.00    Types: Cigarettes    Start date: 1958    Quit date: 01/19/1993    Years since quitting: 27.9   Smokeless tobacco: Never  Vaping Use   Vaping Use: Never used  Substance and Sexual Activity   Alcohol use: Yes    Comment: 01/02/2016 "might drink a glass of wine socially q 3-4 months"   Drug use: No   Sexual activity: Not Currently    Birth control/protection: Post-menopausal  Other Topics Concern   Not on file  Social History Narrative   Married with 5 children   Social Determinants of Health   Financial Resource Strain: Not on file  Food Insecurity: Not on file  Transportation Needs: Not on file  Physical Activity: Not on file  Stress: Not on file  Social Connections: Not on file  Intimate Partner Violence: Not on file     Allergies  Allergen Reactions   Erythromycin Other (See Comments)    Made the patient's tongue "burn"   Tape Rash and Other (See Comments)    PLEASE USE PAPER TAPE!!   Meperidine Nausea And Vomiting   Shellfish Allergy Nausea And Vomiting    Ciprofloxacin Rash and Other (See Comments)    Rash from IV   Codeine Nausea  And Vomiting   Latex Rash and Other (See Comments)    Made the patient's skin "burn," also   Penicillins Rash and Other (See Comments)    Has patient had a PCN reaction causing immediate rash, facial/tongue/throat swelling, SOB or lightheadedness with hypotension: Unk Has patient had a PCN reaction causing severe rash involving mucus membranes or skin necrosis: No Has patient had a PCN reaction that required hospitalization: No Has patient had a PCN reaction occurring within the last 10 years: No If all of the above answers are "NO", then may proceed with Cephalosporin use.      Outpatient Medications Prior to Visit  Medication Sig Dispense Refill   acetaminophen (TYLENOL) 650 MG CR tablet Take 1,300 mg by mouth 2 (two) times daily.     albuterol (PROVENTIL) (2.5 MG/3ML) 0.083% nebulizer solution Take 3 mLs (2.5 mg total) by nebulization every 4 (four) hours as needed for wheezing or shortness of breath. Dx:J44.9 75 mL 12   albuterol (PROVENTIL) (5 MG/ML) 0.5% nebulizer solution Take 0.5 mLs (2.5 mg total) by nebulization every 6 (six) hours as needed for wheezing or shortness of breath. (Patient not taking: Reported on 12/19/2020) 20 mL 2   albuterol (VENTOLIN HFA) 108 (90 Base) MCG/ACT inhaler Inhale into the lungs every 6 (six) hours as needed for wheezing or shortness of breath.     apixaban (ELIQUIS) 2.5 MG TABS tablet Take 1 tablet (2.5 mg total) by mouth 2 (two) times daily. 60 tablet 5   azelastine (ASTELIN) 0.1 % nasal spray Place 2 sprays into both nostrils at bedtime as needed for rhinitis. 30 mL 3   Biotin 5 MG TABS Take 5 mg by mouth daily.      Calcium Carbonate-Vitamin D (CALCIUM + D PO) Take 1 tablet by mouth 2 (two) times a week.      Cholecalciferol (VITAMIN D3) 2000 units TABS Take 2,000 Units by mouth daily.      clopidogrel (PLAVIX) 75 MG tablet TAKE 1 TABLET BY MOUTH ONCE A DAY 90 tablet 0    diltiazem (CARDIZEM CD) 240 MG 24 hr capsule TAKE 1 CAPSULE BY MOUTH ONCE DAILY 90 capsule 2   diltiazem (CARDIZEM) 30 MG tablet Take one tablet by mouth every 4 hours AS NEEDED for A-fib HR > 100 as long as BP > 100. 45 tablet 0   dofetilide (TIKOSYN) 125 MCG capsule TAKE 1 CAPSULE BY MOUTH TWICE DAILY 60 capsule 3   ezetimibe (ZETIA) 10 MG tablet TAKE 1 TABLET BY MOUTH DAILY 90 tablet 0   furosemide (LASIX) 20 MG tablet Take 1 tablet (20 mg total) by mouth as directed. Take 1 tablet (20 mg) daily and extra 1 tablet (20 mg) at 2 PM for shortness of breath or abdominal swelling 180 tablet 2   HYDROcodone-acetaminophen (NORCO) 10-325 MG tablet Take 1 tablet by mouth every 6 (six) hours as needed for severe pain.      ipratropium (ATROVENT) 0.02 % nebulizer solution Take 2.5 mLs (0.5 mg total) by nebulization 4 (four) times daily. 75 mL 2   isosorbide mononitrate (IMDUR) 60 MG 24 hr tablet TAKE 1 TABLET BY MOUTH TWICE A DAY 180 tablet 2   Krill Oil 500 MG CAPS Take 500 mg by mouth daily.      levothyroxine (SYNTHROID) 50 MCG tablet Take 1 tablet (50 mcg total) by mouth daily. 90 tablet 2   nitroGLYCERIN (NITROSTAT) 0.4 MG SL tablet Place 1 tablet (0.4 mg total) under the tongue every 5 (  five) minutes as needed for chest pain. 25 tablet 3   omeprazole (PRILOSEC) 20 MG capsule Take 1 capsule (20 mg total) by mouth daily. 90 capsule 3   Polyethyl Glycol-Propyl Glycol (SYSTANE OP) Place 1 drop into both eyes 2 (two) times daily as needed (dry/irritated eyes.).      potassium chloride (KLOR-CON) 10 MEQ tablet Take 1 tablet (10 mEq total) by mouth daily. And take 2 tablets (75meq) on days you take furosemide 135 tablet 2   rosuvastatin (CRESTOR) 40 MG tablet TAKE 1 TABLET BY MOUTH ONCE DAILY 90 tablet 0   sertraline (ZOLOFT) 100 MG tablet Take 1 tablet (100 mg total) by mouth daily. 90 tablet 2   TRELEGY ELLIPTA 100-62.5-25 MCG/INH AEPB INAHLE 1 PUFF INTO THE LUNGS DAILY 60 each 5   zinc gluconate 50 MG  tablet Take 50 mg by mouth daily.     neomycin-polymyxin b-dexamethasone (MAXITROL) 3.5-10000-0.1 SUSP Place 1 drop into both eyes every 6 (six) hours. (Patient not taking: Reported on 12/19/2020)     predniSONE (STERAPRED UNI-PAK 21 TAB) 10 MG (21) TBPK tablet As directed (Patient not taking: Reported on 12/19/2020) 21 tablet 0   No facility-administered medications prior to visit.         Objective:   Physical Exam  Vitals:   01/03/21 1128  BP: 128/70  Pulse: (!) 58  Temp: 97.7 F (36.5 C)  TempSrc: Oral  SpO2: 96%  Weight: 134 lb 6.4 oz (61 kg)  Height: 5\' 2"  (1.575 m)   Gen: Pleasant, overweight elderly woman, in no distress,  normal affect  ENT: No lesions,  mouth clear,  oropharynx clear, no postnasal drip  Neck: No JVD, no stridor  Lungs: No use of accessory muscles, no crackles.  Coarse B, no wheeze  Cardiovascular: Irregularly irregular, no edema  Musculoskeletal: No deformities, no cyanosis or clubbing  Neuro: alert, awake, non focal  Skin: Warm, no lesions or rash      Assessment & Plan:  COPD (chronic obstructive pulmonary disease) (HCC) Overall stable. Still has exertional SOB but no increased sputum or cough. Suspect that we continue to under treat with O2.   Continue Trelegy 1 inhalation once daily.  Rinse and gargle after using. Continue albuterol 2 puffs if needed for shortness of breath, chest tightness, wheezing. Follow with Dr Lamonte Sakai in 6 months or sooner if you have any problems  OSA (obstructive sleep apnea) Marginal compliance since she had COVID-19 in January.  States that she sometimes feels her mask is smothering her.  We may need to adjust her pressures (currently on AutoSet 5-15 cmH2O).  To start I think will be reasonable to try changing her to nasal pillows to see if better mask fit will solve her problem.  Will order for her today.  Chronic hypoxemic respiratory failure (HCC) She does not use her oxygen reliably, has documented  desaturations with exertion.  She was considering turning the oxygen back into her DME company but after our discussion today she is willing to try to improve her compliance.  She will use 2 L/min with exertion  Baltazar Apo, MD, PhD 01/03/2021, 2:13 PM [facility] [phone] or if no answer [phone]

## 2021-01-03 NOTE — Assessment & Plan Note (Signed)
Overall stable. Still has exertional SOB but no increased sputum or cough. Suspect that we continue to under treat with O2.   Continue Trelegy 1 inhalation once daily.  Rinse and gargle after using. Continue albuterol 2 puffs if needed for shortness of breath, chest tightness, wheezing. Follow with Dr Lamonte Sakai in 6 months or sooner if you have any problems

## 2021-01-03 NOTE — Assessment & Plan Note (Signed)
She does not use her oxygen reliably, has documented desaturations with exertion.  She was considering turning the oxygen back into her DME company but after our discussion today she is willing to try to improve her compliance.  She will use 2 L/min with exertion

## 2021-01-03 NOTE — Assessment & Plan Note (Signed)
Marginal compliance since she had COVID-19 in January.  States that she sometimes feels her mask is smothering her.  We may need to adjust her pressures (currently on AutoSet 5-15 cmH2O).  To start I think will be reasonable to try changing her to nasal pillows to see if better mask fit will solve her problem.  Will order for her today.

## 2021-01-03 NOTE — Telephone Encounter (Signed)
Formed signed and faxed to [employer] on 12/28/20. Fax confirmation confirmed success. Nothing further needed at this time

## 2021-01-03 NOTE — Patient Instructions (Addendum)
Please start wearing her oxygen at 2 L/min reliably when you exert yourself. Continue your CPAP every night.  We will change her mask to the nasal pillows with a chinstrap to see if this helps make it more comfortable and useful and allows you to wear it more reliably. Continue Trelegy 1 inhalation once daily.  Rinse and gargle after using. Continue albuterol 2 puffs if needed for shortness of breath, chest tightness, wheezing. Follow with APP in 2 months to discuss CPAP compliance on new mask Follow with Dr Lamonte Sakai in 6 months or sooner if you have any problems

## 2021-01-08 ENCOUNTER — Other Ambulatory Visit: Payer: Self-pay | Admitting: Cardiovascular Disease

## 2021-01-08 ENCOUNTER — Other Ambulatory Visit: Payer: Self-pay | Admitting: Family Medicine

## 2021-01-10 ENCOUNTER — Other Ambulatory Visit: Payer: Self-pay

## 2021-01-10 ENCOUNTER — Ambulatory Visit (INDEPENDENT_AMBULATORY_CARE_PROVIDER_SITE_OTHER): Payer: Medicare HMO | Admitting: Family Medicine

## 2021-01-10 ENCOUNTER — Encounter: Payer: Self-pay | Admitting: Family Medicine

## 2021-01-10 VITALS — BP 108/66 | HR 72 | Temp 98.1°F | Resp 16 | Ht 62.0 in | Wt 132.0 lb

## 2021-01-10 DIAGNOSIS — Z1231 Encounter for screening mammogram for malignant neoplasm of breast: Secondary | ICD-10-CM | POA: Diagnosis not present

## 2021-01-10 DIAGNOSIS — E782 Mixed hyperlipidemia: Secondary | ICD-10-CM | POA: Diagnosis not present

## 2021-01-10 DIAGNOSIS — I10 Essential (primary) hypertension: Secondary | ICD-10-CM | POA: Diagnosis not present

## 2021-01-10 DIAGNOSIS — G4733 Obstructive sleep apnea (adult) (pediatric): Secondary | ICD-10-CM

## 2021-01-10 DIAGNOSIS — E039 Hypothyroidism, unspecified: Secondary | ICD-10-CM

## 2021-01-10 DIAGNOSIS — E559 Vitamin D deficiency, unspecified: Secondary | ICD-10-CM | POA: Diagnosis not present

## 2021-01-10 DIAGNOSIS — I482 Chronic atrial fibrillation, unspecified: Secondary | ICD-10-CM

## 2021-01-10 DIAGNOSIS — R739 Hyperglycemia, unspecified: Secondary | ICD-10-CM | POA: Diagnosis not present

## 2021-01-10 DIAGNOSIS — J449 Chronic obstructive pulmonary disease, unspecified: Secondary | ICD-10-CM

## 2021-01-10 DIAGNOSIS — Z0000 Encounter for general adult medical examination without abnormal findings: Secondary | ICD-10-CM

## 2021-01-10 DIAGNOSIS — M810 Age-related osteoporosis without current pathological fracture: Secondary | ICD-10-CM | POA: Diagnosis not present

## 2021-01-10 MED ORDER — SERTRALINE HCL 100 MG PO TABS: 100.0000 mg | ORAL_TABLET | Freq: Every day | ORAL | 1 refills | Status: DC

## 2021-01-10 MED ORDER — LEVOTHYROXINE SODIUM 75 MCG PO TABS: 75.0000 ug | ORAL_TABLET | Freq: Every day | ORAL | 3 refills | Status: DC

## 2021-01-10 MED ORDER — HYDROCODONE-ACETAMINOPHEN 10-325 MG PO TABS: 1.0000 | ORAL_TABLET | Freq: Four times a day (QID) | ORAL | 0 refills | Status: DC | PRN

## 2021-01-10 NOTE — Assessment & Plan Note (Signed)
hgba1c acceptable, minimize simple carbs. Increase exercise as tolerated.  

## 2021-01-10 NOTE — Progress Notes (Signed)
[name], female    DOB: 22-Dec-1940  Age: 80 y.o. MRN: [id number]    Subjective:  Subjective  HPI Andrea [name] presents for office visit today for comprehensive physical exam today and follow up on management of chronic concerns. She states that when she had her shingles shot she experienced a 102 degree fever. However, she states that she had no side effects from taking the COVID-19 shots. She has had covid for 3 weeks and stil experiences residual symptoms like loss of sense of taste. She denies any bleeding or HA issues. She states that she gets cold even when her house is set to 76 degrees. Denies CP/palp/SOB/HA/congestion/fevers/GI or GU c/o. Taking meds as prescribed. She reports that her husband passed away November 29, 2022 of last year with dx of bone cancer. She endorses staying active.  Review of Systems  Constitutional:  Positive for chills and unexpected weight change (weight loss). Negative for fatigue and fever.  HENT:  Negative for congestion, rhinorrhea, sinus pressure, sinus pain, sore throat and trouble swallowing.   Eyes:  Negative for pain.  Respiratory:  Positive for cough. Negative for shortness of breath.   Cardiovascular:  Negative for chest pain, palpitations and leg swelling.  Gastrointestinal:  Negative for abdominal pain, blood in stool, diarrhea, nausea and vomiting.  Genitourinary:  Negative for decreased urine volume, flank pain, frequency, vaginal bleeding and vaginal discharge.  Musculoskeletal:  Positive for back pain.  Neurological:  Negative for headaches.   History Past Medical History:  Diagnosis Date   AAA (abdominal aortic aneurysm) ([place]) 01/2009   AAA (2.8 x 3.0)  moderate RAS (left); 2.7 x 2.7 cm (07/11/05)   Acute bronchitis 03/20/2013; 2017   Angina    Anosmia    Asthma    Baker's cyst of knee 01/30/2014   Basal cell carcinoma    "back and left arm"   Bradycardia    Metoprolol stopped 08/2011   CAD (coronary artery disease)    s/CABG  (reports IMA and 2 SVGs) back in 1999  Myoview normal 3/10; s/p PCI with DES to PL branch of distal RCA 09/2011; PCI +DES to SVG-RCA, PCI + DES to mid LCx 12/2015   Cerebrovascular disease 01/2009   carotid u/s  R 0-39%   L 60-79%   Chronic thoracic back pain    COPD (chronic obstructive pulmonary disease) ([facility])    Depression    Dizziness    Dysrhythmia    hx of sinus brady   GERD (gastroesophageal reflux disease)    Heart murmur    Herniated lumbar disc without myelopathy    History of blood transfusion 1999   "when I had the bypass; had a PE"   Hyperglycemia 10/23/2015   Hyperlipidemia    Hypertension    Hypothyroidism 09/30/2016   Medicare annual wellness visit, subsequent 07/31/2013   NSTEMI (non-ST elevated myocardial infarction) ([place]) 11/12   Cath showed atretic IMA graft to the LAD, SVG to PD was patent but the continuation of this graft to the PL branch was occluded; there were L to R collaterals; Mid LAD had a 60 to 70% stenosis. She has been treated medically.  Neg Myoview 05/2011   Osteoarthritis of back    Osteoporosis    Pneumonia "several times"   Pulmonary embolism ([place]) 1999   "after my bypass"   PVD (peripheral vascular disease) ([place])    Shortness of breath    Squamous carcinoma    "nose"   Thyroid disease  Hypothyroid   Unstable angina ([place]) 09/25/2015    She has a past surgical history that includes Tubal ligation; Abdominal aortic aneurysm repair; Femoral artery stent (Bilateral); Closed reduction nasal fracture (11/2007); Carotid endarterectomy (Left, 01/02/2011); Nasal sinus surgery; left heart catheterization with coronary/graft angiogram (N/A, 04/22/2011); left heart catheterization with coronary/graft angiogram (N/A, 10/10/2011); Fracture surgery; Dilation and curettage of uterus; Cataract extraction w/ intraocular lens  implant, bilateral; Coronary angioplasty with stent (10/10/2011); Cardiac catheterization (N/A, 01/02/2016); Cardiac catheterization (1996; 1999);  Coronary artery bypass graft (1999); Excision basal cell carcinoma; Squamous cell carcinoma excision; Cardiac catheterization (N/A, 06/25/2016); SVT ABLATION (N/A, 03/19/2018); Cardioversion (N/A, 10/19/2019); and Cardioversion (N/A, 04/25/2020).   Her family history includes Alcohol abuse in her brother; Aneurysm in her brother; Angina in her father; Cirrhosis in her maternal grandfather; Colon cancer (age of onset: 50) in her father; Diabetes in her maternal grandmother and mother; Heart attack in her brother and brother; Heart attack (age of onset: 33) in her mother; Heart disease in her brother, brother, and father; Hyperlipidemia in her daughter; Hypertension in her brother and father; Hypothyroidism in her sister; Lung cancer in her father and sister; Obesity in her daughter and son; Stroke in her maternal grandmother and paternal grandmother.She reports that she quit smoking about 28 years ago. Her smoking use included cigarettes. She started smoking about 64 years ago. She has a 20.00 pack-year smoking history. She has never used smokeless tobacco. She reports current alcohol use. She reports that she does not use drugs.  Current Outpatient Medications on File Prior to Visit  Medication Sig Dispense Refill   acetaminophen (TYLENOL) 650 MG CR tablet Take 1,300 mg by mouth 2 (two) times daily.     albuterol (PROVENTIL) (2.5 MG/3ML) 0.083% nebulizer solution Take 3 mLs (2.5 mg total) by nebulization every 4 (four) hours as needed for wheezing or shortness of breath. Dx:J44.9 75 mL 12   albuterol (VENTOLIN HFA) 108 (90 Base) MCG/ACT inhaler Inhale into the lungs every 6 (six) hours as needed for wheezing or shortness of breath.     apixaban (ELIQUIS) 2.5 MG TABS tablet Take 1 tablet (2.5 mg total) by mouth 2 (two) times daily. 60 tablet 5   azelastine (ASTELIN) 0.1 % nasal spray Place 2 sprays into both nostrils at bedtime as needed for rhinitis. 30 mL 3   Biotin 5 MG TABS Take 5 mg by mouth daily.       Calcium Carbonate-Vitamin D (CALCIUM + D PO) Take 1 tablet by mouth 2 (two) times a week.      Cholecalciferol (VITAMIN D3) 2000 units TABS Take 2,000 Units by mouth daily.      clopidogrel (PLAVIX) 75 MG tablet TAKE 1 TABLET BY MOUTH ONCE A DAY 90 tablet 0   diltiazem (CARDIZEM CD) 240 MG 24 hr capsule TAKE 1 CAPSULE BY MOUTH ONCE DAILY 90 capsule 2   diltiazem (CARDIZEM) 30 MG tablet Take one tablet by mouth every 4 hours AS NEEDED for A-fib HR > 100 as long as BP > 100. 45 tablet 0   dofetilide (TIKOSYN) 125 MCG capsule TAKE 1 CAPSULE BY MOUTH TWICE DAILY 60 capsule 3   ezetimibe (ZETIA) 10 MG tablet TAKE 1 TABLET BY MOUTH DAILY 90 tablet 0   furosemide (LASIX) 20 MG tablet Take 1 tablet (20 mg total) by mouth as directed. Take 1 tablet (20 mg) daily and extra 1 tablet (20 mg) at 2 PM for shortness of breath or abdominal swelling 180 tablet 2  ipratropium (ATROVENT) 0.02 % nebulizer solution Take 2.5 mLs (0.5 mg total) by nebulization 4 (four) times daily. 75 mL 2   isosorbide mononitrate (IMDUR) 60 MG 24 hr tablet TAKE 1 TABLET BY MOUTH TWICE A DAY 180 tablet 2   Krill Oil 500 MG CAPS Take 500 mg by mouth daily.      nitroGLYCERIN (NITROSTAT) 0.4 MG SL tablet Place 1 tablet (0.4 mg total) under the tongue every 5 (five) minutes as needed for chest pain. 25 tablet 3   omeprazole (PRILOSEC) 20 MG capsule TAKE 1 CAPSULE BY MOUTH ONCE DAILY 90 capsule 3   Polyethyl Glycol-Propyl Glycol (SYSTANE OP) Place 1 drop into both eyes 2 (two) times daily as needed (dry/irritated eyes.).      potassium chloride (KLOR-CON) 10 MEQ tablet Take 1 tablet (10 mEq total) by mouth daily. And take 2 tablets (64meq) on days you take furosemide 135 tablet 2   rosuvastatin (CRESTOR) 40 MG tablet TAKE 1 TABLET BY MOUTH ONCE DAILY 90 tablet 0   TRELEGY ELLIPTA 100-62.5-25 MCG/INH AEPB INAHLE 1 PUFF INTO THE LUNGS DAILY 60 each 5   zinc gluconate 50 MG tablet Take 50 mg by mouth daily.     albuterol (PROVENTIL) (5 MG/ML)  0.5% nebulizer solution Take 0.5 mLs (2.5 mg total) by nebulization every 6 (six) hours as needed for wheezing or shortness of breath. (Patient not taking: No sig reported) 20 mL 2   No current facility-administered medications on file prior to visit.     Objective:  Objective  Physical Exam Constitutional:      General: She is not in acute distress.    Appearance: Normal appearance. She is not ill-appearing or toxic-appearing.  HENT:     Head: Normocephalic and atraumatic.     Right Ear: Tympanic membrane, ear canal and external ear normal.     Left Ear: Tympanic membrane, ear canal and external ear normal.     Nose: No congestion or rhinorrhea.  Eyes:     Extraocular Movements: Extraocular movements intact.     Right eye: Normal extraocular motion and no nystagmus.     Left eye: Normal extraocular motion and no nystagmus.     Pupils: Pupils are equal, round, and reactive to light.  Cardiovascular:     Rate and Rhythm: Normal rate and regular rhythm.     Pulses: Normal pulses.     Heart sounds: Normal heart sounds. No murmur heard. Pulmonary:     Effort: Pulmonary effort is normal. No respiratory distress.     Breath sounds: Normal breath sounds. No wheezing, rhonchi or rales.  Abdominal:     General: Bowel sounds are normal.     Palpations: Abdomen is soft. There is no mass.     Tenderness: no abdominal tenderness There is no guarding.     Hernia: No hernia is present.  Musculoskeletal:        General: Normal range of motion.     Cervical back: Normal range of motion and neck supple.  Skin:    General: Skin is warm and dry.  Neurological:     Mental Status: She is alert and oriented to person, place, and time.     Cranial Nerves: No facial asymmetry.     Motor: Motor function is intact. No weakness.  Psychiatric:        Behavior: Behavior normal.   BP 108/66   Pulse 72   Temp 98.1 F (36.7 C)   Resp 16  Ht 5\' 2"  (1.575 m)   Wt 132 lb (59.9 kg)   SpO2 91%   BMI  24.14 kg/m  Wt Readings from Last 3 Encounters:  01/10/21 132 lb (59.9 kg)  01/03/21 134 lb 6.4 oz (61 kg)  12/19/20 134 lb 4.8 oz (60.9 kg)     Lab Results  Component Value Date   WBC 8.3 01/10/2021   HGB 11.4 (L) 01/10/2021   HCT 35.7 (L) 01/10/2021   PLT 298.0 01/10/2021   GLUCOSE 80 01/10/2021   CHOL 121 01/10/2021   TRIG 191.0 (H) 01/10/2021   HDL 26.40 (L) 01/10/2021   LDLDIRECT 61.0 06/13/2019   LDLCALC 57 01/10/2021   ALT 9 01/10/2021   AST 11 01/10/2021   NA 140 01/10/2021   K 4.0 01/10/2021   CL 103 01/10/2021   CREATININE 0.81 01/10/2021   BUN 13 01/10/2021   CO2 28 01/10/2021   TSH 3.740 12/19/2020   INR 2.8 (H) 04/10/2020   HGBA1C 6.5 01/10/2021   MICROALBUR 6.9 (H) 12/01/2017    No results found.   Assessment & Plan:  Plan    Meds ordered this encounter  Medications   levothyroxine (SYNTHROID) 75 MCG tablet    Sig: Take 1 tablet (75 mcg total) by mouth daily.    Dispense:  30 tablet    Refill:  3   sertraline (ZOLOFT) 100 MG tablet    Sig: Take 1 tablet (100 mg total) by mouth daily.    Dispense:  135 tablet    Refill:  1   HYDROcodone-acetaminophen (NORCO) 10-325 MG tablet    Sig: Take 1 tablet by mouth every 6 (six) hours as needed for severe pain.    Dispense:  30 tablet    Refill:  0    Problem List Items Addressed This Visit     Hyperlipidemia - Primary    Encourage heart healthy diet such as MIND or DASH diet, increase exercise, avoid trans fats, simple carbohydrates and processed foods, consider a krill or fish or flaxseed oil cap daily.       Relevant Orders   Lipid panel (Completed)   Lipid panel   HTN (hypertension)    Well controlled, no changes to meds. Encouraged heart healthy diet such as the DASH diet and exercise as tolerated.       Relevant Orders   CBC (Completed)   Comprehensive metabolic panel (Completed)   Osteoporosis    Encouraged to get adequate exercise, calcium and vitamin d intake. Repeat Dexa scan  ordered      Relevant Orders   DG Bone Density   Breast cancer screening   Relevant Orders   MM 3D SCREEN BREAST BILATERAL   Hyperglycemia    hgba1c acceptable, minimize simple carbs. Increase exercise as tolerated.       Relevant Orders   Hemoglobin A1c (Completed)   Hypothyroidism    On Levothyroxine, continue to monitor      Relevant Medications   levothyroxine (SYNTHROID) 75 MCG tablet   Other Relevant Orders   TSH   T4, free   Preventative health care    Patient encouraged to maintain heart healthy diet, regular exercise, adequate sleep. Consider daily probiotics. Take medications as prescribed. Labs reviewed and ordered. MGM and Dexa scan ordered has aged out of colonoscopy screening      Vitamin D deficiency    Supplement and monitor      Relevant Orders   VITAMIN D 25 Hydroxy (Vit-D Deficiency, Fractures) (Completed)  Atrial fibrillation, chronic ([place])    Doing well, following with cardiology, no change in current medications. Rate controlled and tolerating Tikosyn      OSA (obstructive sleep apnea)    Following with pulmonology      COPD (chronic obstructive pulmonary disease) ([place])    Follows with [facility], stable, no recent exacerbations.       Follow-up: Return in about 6 months (around 07/13/2021), or lab only appt in 3 monthsm then VV right after then 6 mn f/u.  I, Suezanne Jacquet, acting as a scribe for [name], MD, have documented all relevent documentation on behalf of [name], MD, as directed by [name], MD while in the presence of [name], MD.  I, Mosie Lukes, MD personally performed the services described in this documentation. All medical record entries made by the scribe were at my direction and in my presence. I have reviewed the chart and agree that the record reflects my personal performance and is accurate and complete

## 2021-01-10 NOTE — Assessment & Plan Note (Signed)
Well controlled, no changes to meds. Encouraged heart healthy diet such as the DASH diet and exercise as tolerated.  °

## 2021-01-10 NOTE — Assessment & Plan Note (Signed)
Encourage heart healthy diet such as MIND or DASH diet, increase exercise, avoid trans fats, simple carbohydrates and processed foods, consider a krill or fish or flaxseed oil cap daily.  °

## 2021-01-10 NOTE — Patient Instructions (Addendum)
Paxlovid is the new COVID medication we can give you if you get COVID so make sure you test if you have symptoms because we have to treat by day 5 of symptoms for it to be effective. If you are positive let us know so we can treat. If a home test is negative and your symptoms are persistent get a PCR test. Can check testing locations at [email or web address] If you are positive we will make an appointment with us and we will send in Paxlovid if you would like it. Check with your pharmacy before we meet to confirm they have it in stock, if they do not then we can get the prescription at the [facility]     Preventive Care 82 Years and Older, Female Preventive care refers to lifestyle choices and visits with your health care provider that can promote health and wellness. This includes: A yearly physical exam. This is also called an annual wellness visit. Regular dental and eye exams. Immunizations. Screening for certain conditions. Healthy lifestyle choices, such as: Eating a healthy diet. Getting regular exercise. Not using drugs or products that contain nicotine and tobacco. Limiting alcohol use. What can I expect for my preventive care visit? Physical exam Your health care provider will check your: Height and weight. These may be used to calculate your BMI (body mass index). BMI is a measurement that tells if you are at a healthy weight. Heart rate and blood pressure. Body temperature. Skin for abnormal spots. Counseling Your health care provider may ask you questions about your: Past medical problems. Family's medical history. Alcohol, tobacco, and drug use. Emotional well-being. Home life and relationship well-being. Sexual activity. Diet, exercise, and sleep habits. History of falls. Memory and ability to understand (cognition). Work and work environment. Pregnancy and menstrual history. Access to firearms. What immunizations do I need?  Vaccines are usually  given at various ages, according to a schedule. Your health care provider will recommend vaccines for you based on your age, medicalhistory, and lifestyle or other factors, such as travel or where you work. What tests do I need? Blood tests Lipid and cholesterol levels. These may be checked every 5 years, or more often depending on your overall health. Hepatitis C test. Hepatitis B test. Screening Lung cancer screening. You may have this screening every year starting at age 82 if you have a 30-pack-year history of smoking and currently smoke or have quit within the past 15 years. Colorectal cancer screening. All adults should have this screening starting at age 82 and continuing until age 82. Your health care provider may recommend screening at age 82 if you are at increased risk. You will have tests every 1-10 years, depending on your results and the type of screening test. Diabetes screening. This is done by checking your blood sugar (glucose) after you have not eaten for a while (fasting). You may have this done every 1-3 years. Mammogram. This may be done every 1-2 years. Talk with your health care provider about how often you should have regular mammograms. Abdominal aortic aneurysm (AAA) screening. You may need this if you are a current or former smoker. BRCA-related cancer screening. This may be done if you have a family history of breast, ovarian, tubal, or peritoneal cancers. Other tests STD (sexually transmitted disease) testing, if you are at risk. Bone density scan. This is done to screen for osteoporosis. You may have this done starting at age 82. Talk with your health care provider about your   test results, treatment options,and if necessary, the need for more tests. Follow these instructions at home: Eating and drinking  Eat a diet that includes fresh fruits and vegetables, whole grains, lean protein, and low-fat dairy products. Limit your intake of foods with high amounts of  sugar, saturated fats, and salt. Take vitamin and mineral supplements as recommended by your health care provider. Do not drink alcohol if your health care provider tells you not to drink. If you drink alcohol: Limit how much you have to 0-1 drink a day. Be aware of how much alcohol is in your drink. In the [place] one drink equals one 12 oz bottle of beer (355 mL), one 5 oz glass of wine (148 mL), or one 1 oz glass of hard liquor (44 mL).  Lifestyle Take daily care of your teeth and gums. Brush your teeth every morning and night with fluoride toothpaste. Floss one time each day. Stay active. Exercise for at least 30 minutes 5 or more days each week. Do not use any products that contain nicotine or tobacco, such as cigarettes, e-cigarettes, and chewing tobacco. If you need help quitting, ask your health care provider. Do not use drugs. If you are sexually active, practice safe sex. Use a condom or other form of protection in order to prevent STIs (sexually transmitted infections). Talk with your health care provider about taking a low-dose aspirin or statin. Find healthy ways to cope with stress, such as: Meditation, yoga, or listening to music. Journaling. Talking to a trusted person. Spending time with friends and family. Safety Always wear your seat belt while driving or riding in a vehicle. Do not drive: If you have been drinking alcohol. Do not ride with someone who has been drinking. When you are tired or distracted. While texting. Wear a helmet and other protective equipment during sports activities. If you have firearms in your house, make sure you follow all gun safety procedures. What's next? Visit your health care provider once a year for an annual wellness visit. Ask your health care provider how often you should have your eyes and teeth checked. Stay up to date on all vaccines. This information is not intended to replace advice given to you by your health care provider.  Make sure you discuss any questions you have with your healthcare provider. Document Revised: 05/02/2020 Document Reviewed: 05/06/2018 Elsevier Patient Education  2022 Elsevier Inc.  

## 2021-01-10 NOTE — Assessment & Plan Note (Addendum)
Patient encouraged to maintain heart healthy diet, regular exercise, adequate sleep. Consider daily probiotics. Take medications as prescribed. Labs reviewed and ordered. MGM and Dexa scan ordered has aged out of colonoscopy screening

## 2021-01-11 ENCOUNTER — Other Ambulatory Visit: Payer: Self-pay | Admitting: Family Medicine

## 2021-01-11 LAB — COMPREHENSIVE METABOLIC PANEL
ALT: 9 U/L (ref 0–35)
AST: 11 U/L (ref 0–37)
Albumin: 3.7 g/dL (ref 3.5–5.2)
Alkaline Phosphatase: 99 U/L (ref 39–117)
BUN: 13 mg/dL (ref 6–23)
CO2: 28 mEq/L (ref 19–32)
Calcium: 9 mg/dL (ref 8.4–10.5)
Chloride: 103 mEq/L (ref 96–112)
Creatinine, Ser: 0.81 mg/dL (ref 0.40–1.20)
GFR: 67.89 mL/min (ref >60.00)
Glucose, Bld: 80 mg/dL (ref 70–99)
Potassium: 4 mEq/L (ref 3.5–5.1)
Sodium: 140 mEq/L (ref 135–145)
Total Bilirubin: 0.4 mg/dL (ref 0.2–1.2)
Total Protein: 6.7 g/dL (ref 6.0–8.3)

## 2021-01-11 LAB — LIPID PANEL
Cholesterol: 121 mg/dL (ref 0–200)
HDL: 26.4 mg/dL — ABNORMAL LOW (ref >39.00)
LDL Cholesterol: 57 mg/dL (ref 0–99)
NonHDL: 94.8
Total CHOL/HDL Ratio: 5
Triglycerides: 191 mg/dL — ABNORMAL HIGH (ref 0.0–149.0)
VLDL: 38.2 mg/dL (ref 0.0–40.0)

## 2021-01-11 LAB — CBC
HCT: 35.7 % — ABNORMAL LOW (ref 36.0–46.0)
Hemoglobin: 11.4 g/dL — ABNORMAL LOW (ref 12.0–15.0)
MCHC: 31.9 g/dL (ref 30.0–36.0)
MCV: 77.3 fl — ABNORMAL LOW (ref 78.0–100.0)
Platelets: 298 10*3/uL (ref 150.0–400.0)
RBC: 4.63 Mil/uL (ref 3.87–5.11)
RDW: 16.6 % — ABNORMAL HIGH (ref 11.5–15.5)
WBC: 8.3 10*3/uL (ref 4.0–10.5)

## 2021-01-11 LAB — VITAMIN D 25 HYDROXY (VIT D DEFICIENCY, FRACTURES): VITD: 57.3 ng/mL (ref 30.00–100.00)

## 2021-01-11 LAB — HEMOGLOBIN A1C: Hgb A1c MFr Bld: 6.5 % (ref 4.6–6.5)

## 2021-01-11 MED ORDER — HEMOCYTE-F 324-1 MG PO TABS: 1.0000 | ORAL_TABLET | Freq: Every day | ORAL | 3 refills | Status: DC

## 2021-01-13 NOTE — Assessment & Plan Note (Signed)
Doing well, following with cardiology, no change in current medications. Rate controlled and tolerating Tikosyn

## 2021-01-13 NOTE — Assessment & Plan Note (Signed)
Supplement and monitor 

## 2021-01-13 NOTE — Assessment & Plan Note (Signed)
Following with pulmonology

## 2021-01-13 NOTE — Assessment & Plan Note (Signed)
Encouraged to get adequate exercise, calcium and vitamin d intake. Repeat Dexa scan ordered

## 2021-01-13 NOTE — Assessment & Plan Note (Signed)
Follows with [facility], stable, no recent exacerbations.

## 2021-01-13 NOTE — Assessment & Plan Note (Signed)
On Levothyroxine, continue to monitor 

## 2021-01-14 ENCOUNTER — Telehealth: Payer: Self-pay | Admitting: Family Medicine

## 2021-01-14 NOTE — Telephone Encounter (Signed)
Pharmacy is calling to see if medication can it be split into an iron and then a folic sperate due to insurance issues and payment. Pharmacy number: [phone]: Ferrous Fumarate-Folic Acid (HEMOCYTE-F) 324-1 MG TABS [[id number]]

## 2021-01-15 ENCOUNTER — Other Ambulatory Visit: Payer: Self-pay

## 2021-01-15 MED ORDER — FOLIC ACID 1 MG PO TABS: 1.0000 mg | ORAL_TABLET | Freq: Every day | ORAL | 1 refills | Status: DC

## 2021-01-15 MED ORDER — FERROUS FUMARATE 325 (106 FE) MG PO TABS: 1.0000 | ORAL_TABLET | Freq: Every day | ORAL | 1 refills | Status: DC

## 2021-01-15 NOTE — Telephone Encounter (Signed)
done

## 2021-01-21 DIAGNOSIS — I509 Heart failure, unspecified: Secondary | ICD-10-CM | POA: Diagnosis not present

## 2021-01-21 DIAGNOSIS — J449 Chronic obstructive pulmonary disease, unspecified: Secondary | ICD-10-CM | POA: Diagnosis not present

## 2021-01-29 DIAGNOSIS — D333 Benign neoplasm of cranial nerves: Secondary | ICD-10-CM | POA: Diagnosis not present

## 2021-02-11 ENCOUNTER — Ambulatory Visit (HOSPITAL_BASED_OUTPATIENT_CLINIC_OR_DEPARTMENT_OTHER)
Admission: RE | Admit: 2021-02-11 | Discharge: 2021-02-11 | Disposition: A | Discharge status: Home / Self Care | Payer: Medicare HMO | Source: Ambulatory Visit | Attending: Family Medicine | Admitting: Family Medicine

## 2021-02-11 ENCOUNTER — Encounter (HOSPITAL_BASED_OUTPATIENT_CLINIC_OR_DEPARTMENT_OTHER): Payer: Self-pay

## 2021-02-11 ENCOUNTER — Other Ambulatory Visit: Payer: Self-pay

## 2021-02-11 DIAGNOSIS — Z1231 Encounter for screening mammogram for malignant neoplasm of breast: Secondary | ICD-10-CM | POA: Insufficient documentation

## 2021-02-11 DIAGNOSIS — M810 Age-related osteoporosis without current pathological fracture: Secondary | ICD-10-CM | POA: Diagnosis not present

## 2021-02-11 DIAGNOSIS — Z780 Asymptomatic menopausal state: Secondary | ICD-10-CM | POA: Diagnosis not present

## 2021-02-11 DIAGNOSIS — M8589 Other specified disorders of bone density and structure, multiple sites: Secondary | ICD-10-CM | POA: Diagnosis not present

## 2021-02-17 ENCOUNTER — Other Ambulatory Visit: Payer: Self-pay | Admitting: *Deleted

## 2021-02-17 MED ORDER — TRELEGY ELLIPTA 100-62.5-25 MCG/INH IN AEPB: INHALATION_SPRAY | RESPIRATORY_TRACT | 5 refills | Status: DC

## 2021-02-21 DIAGNOSIS — J449 Chronic obstructive pulmonary disease, unspecified: Secondary | ICD-10-CM | POA: Diagnosis not present

## 2021-02-21 DIAGNOSIS — I509 Heart failure, unspecified: Secondary | ICD-10-CM | POA: Diagnosis not present

## 2021-02-26 ENCOUNTER — Other Ambulatory Visit (HOSPITAL_COMMUNITY): Payer: Self-pay | Admitting: Cardiology

## 2021-02-26 ENCOUNTER — Other Ambulatory Visit: Payer: Self-pay | Admitting: Physician Assistant

## 2021-02-26 ENCOUNTER — Telehealth: Payer: Self-pay | Admitting: Family Medicine

## 2021-02-26 NOTE — Telephone Encounter (Signed)
Patient states that the increase of Zoloft has not helped her, she states she is still very depressed and all she wants to do is sleep. She wants to know if she needs to change medication or what else she can do. Please advice.

## 2021-02-26 NOTE — Telephone Encounter (Signed)
Pt last saw Oda Kilts, [place] on 12/19/20, last labs 01/10/21 Creat 0.81, age 80, weight 59.9, based on specified criteria pt is on appropriate dosage of Eliquis 2.5mg  BID for afib.  Will refill rx.

## 2021-02-27 ENCOUNTER — Other Ambulatory Visit: Payer: Self-pay

## 2021-02-27 MED ORDER — FLUOXETINE HCL 40 MG PO CAPS: 40.0000 mg | ORAL_CAPSULE | Freq: Every day | ORAL | 3 refills | Status: DC

## 2021-02-27 NOTE — Telephone Encounter (Signed)
Pt schedule and medication sent

## 2021-03-05 ENCOUNTER — Encounter: Payer: Self-pay | Admitting: Adult Health

## 2021-03-05 ENCOUNTER — Other Ambulatory Visit: Payer: Self-pay

## 2021-03-05 ENCOUNTER — Other Ambulatory Visit: Payer: Self-pay | Admitting: *Deleted

## 2021-03-05 ENCOUNTER — Ambulatory Visit: Payer: Medicare HMO | Admitting: Adult Health

## 2021-03-05 ENCOUNTER — Ambulatory Visit (INDEPENDENT_AMBULATORY_CARE_PROVIDER_SITE_OTHER): Payer: Medicare HMO

## 2021-03-05 VITALS — BP 120/58 | HR 76 | Temp 98.8°F | Ht 62.0 in | Wt 129.0 lb

## 2021-03-05 DIAGNOSIS — J449 Chronic obstructive pulmonary disease, unspecified: Secondary | ICD-10-CM | POA: Diagnosis not present

## 2021-03-05 DIAGNOSIS — R042 Hemoptysis: Secondary | ICD-10-CM | POA: Diagnosis not present

## 2021-03-05 DIAGNOSIS — R0602 Shortness of breath: Secondary | ICD-10-CM | POA: Diagnosis not present

## 2021-03-05 DIAGNOSIS — J9611 Chronic respiratory failure with hypoxia: Secondary | ICD-10-CM

## 2021-03-05 DIAGNOSIS — R911 Solitary pulmonary nodule: Secondary | ICD-10-CM | POA: Diagnosis not present

## 2021-03-05 DIAGNOSIS — R918 Other nonspecific abnormal finding of lung field: Secondary | ICD-10-CM | POA: Insufficient documentation

## 2021-03-05 MED ORDER — ALBUTEROL SULFATE HFA 108 (90 BASE) MCG/ACT IN AERS: 2.0000 | INHALATION_SPRAY | Freq: Four times a day (QID) | RESPIRATORY_TRACT | 3 refills | Status: DC | PRN

## 2021-03-05 MED ORDER — PREDNISONE 20 MG PO TABS: 20.0000 mg | ORAL_TABLET | Freq: Every day | ORAL | 0 refills | Status: DC

## 2021-03-05 MED ORDER — DOXYCYCLINE HYCLATE 100 MG PO TABS: 100.0000 mg | ORAL_TABLET | Freq: Two times a day (BID) | ORAL | 0 refills | Status: DC

## 2021-03-05 NOTE — Assessment & Plan Note (Signed)
Lung nodule noted on CT chest November 2021.  Will need a follow-up CT chest.  Plan  Patient Instructions  Chest xray today  Doxycycline 100mg  Twice daily  for 1 week, take with food.  Prednisone 20mg  daily for 5 days  Mucinex DM Twice daily  As needed  cough/congestion  Covid 19 testing , call if positive.  Albuterol inhaler /neb every 6hr as needed.  Continue on TRELEGY 1 puff daily , rinse after use.  Continue on Oxygen 2l/m  CT chest without contrast -lung nodules (within 1 week).  Follow up with Dr. Lamonte Sakai  in 4 weeks and As needed   Please contact office for sooner follow up if symptoms do not improve or worsen or seek emergency care

## 2021-03-05 NOTE — Assessment & Plan Note (Signed)
Patient is continue on oxygen at 2 L.  No increased oxygen demands.

## 2021-03-05 NOTE — Progress Notes (Signed)
@Patient  ID: Andrea [name], female    DOB: 07-15-1940, 80 y.o.   MRN: [id number]  Chief Complaint  Patient presents with   Acute Visit    Referring provider: Mosie Lukes, MD  HPI: 80 yo female former smoker followed for COPD , OSA and Chronic Respiratory Failure on O2   TEST/EVENTS :  PFTs July 25, 2015 showed moderate airflow obstruction with FEV1 at 69%, ratio 57, FVC 92%, no significant bronchodilator response, mid flow reversibility, DLCO 79%.  CT chest July 2011 biapical emphysema, peripheral nodularity within the lingula, right middle and upper lobe are stable dating back to 2007.  CT chest angio abdomen and pelvis November 2021 1.3 and 1.0 cm left upper lobe nodules.  Small right pleural effusion, emphysema, 3.8 cm abdominal aortic aneurysm  03/05/2021 Follow up: COPD, O2 RF  Patient returns for follow up visit. Complains of 2-3 weeks of increased cough, congestion and thick mucus. Has been using Mucinex without much relief. Coughing up some amounts pink/blood tinged mucus. Did not check for Covid. Fully vaccinated for Covid. Remains on Oxygen 2l/m . No increased oxygen demands. Continues on TRELEGY daily.  Is on Eliquis and Plavix .  Patient says her appetite has been low.  No nausea vomiting or diarrhea.     Allergies  Allergen Reactions   Erythromycin Other (See Comments)    Made the patient's tongue "burn"   Tape Rash and Other (See Comments)    PLEASE USE PAPER TAPE!!   Meperidine Nausea And Vomiting   Shellfish Allergy Nausea And Vomiting   Ciprofloxacin Rash and Other (See Comments)    Rash from IV   Codeine Nausea And Vomiting   Latex Rash and Other (See Comments)    Made the patient's skin "burn," also   Penicillins Rash and Other (See Comments)    Has patient had a PCN reaction causing immediate rash, facial/tongue/throat swelling, SOB or lightheadedness with hypotension: Unk Has patient had a PCN reaction causing severe rash involving mucus membranes  or skin necrosis: No Has patient had a PCN reaction that required hospitalization: No Has patient had a PCN reaction occurring within the last 10 years: No If all of the above answers are "NO", then may proceed with Cephalosporin use.     Immunization History  Administered Date(s) Administered   Fluad Quad(high Dose 65+) 04/07/2019   Influenza,inj,Quad PF,6+ Mos 04/15/2016   PFIZER(Purple Top)SARS-COV-2 Vaccination 07/20/2019, 08/10/2019, 11/29/2020   Pneumococcal Conjugate-13 01/24/2014   Pneumococcal Polysaccharide-23 05/25/2002, 12/01/2017   Td 05/24/1999   Tdap 01/24/2014   Zoster Recombinat (Shingrix) 11/29/2020    Past Medical History:  Diagnosis Date   AAA (abdominal aortic aneurysm) 01/2009   AAA (2.8 x 3.0)  moderate RAS (left); 2.7 x 2.7 cm (07/11/05)   Acute bronchitis 03/20/2013; 2017   Angina    Anosmia    Asthma    Baker's cyst of knee 01/30/2014   Basal cell carcinoma    "back and left arm"   Bradycardia    Metoprolol stopped 08/2011   CAD (coronary artery disease)    s/CABG (reports IMA and 2 SVGs) back in 1999  Myoview normal 3/10; s/p PCI with DES to PL branch of distal RCA 09/2011; PCI +DES to SVG-RCA, PCI + DES to mid LCx 12/2015   Cerebrovascular disease 01/2009   carotid u/s  R 0-39%   L 60-79%   Chronic thoracic back pain    COPD (chronic obstructive pulmonary disease) (HCC)    Depression  Dizziness    Dysrhythmia    hx of sinus brady   GERD (gastroesophageal reflux disease)    Heart murmur    Herniated lumbar disc without myelopathy    History of blood transfusion 1999   "when I had the bypass; had a PE"   Hyperglycemia 10/23/2015   Hyperlipidemia    Hypertension    Hypothyroidism 09/30/2016   Medicare annual wellness visit, subsequent 07/31/2013   NSTEMI (non-ST elevated myocardial infarction) ([place]) 11/12   Cath showed atretic IMA graft to the LAD, SVG to PD was patent but the continuation of this graft to the PL branch was occluded; there were L to  R collaterals; Mid LAD had a 60 to 70% stenosis. She has been treated medically.  Neg Myoview 05/2011   Osteoarthritis of back    Osteoporosis    Pneumonia "several times"   Pulmonary embolism ([place]) 1999   "after my bypass"   PVD (peripheral vascular disease) ([place])    Shortness of breath    Squamous carcinoma    "nose"   Thyroid disease    Hypothyroid   Unstable angina ([place]) 09/25/2015    Tobacco History: Social History   Tobacco Use  Smoking Status Former   Packs/day: 0.50   Years: 40.00   Pack years: 20.00   Types: Cigarettes   Start date: 51   Quit date: 01/19/1993   Years since quitting: 28.1  Smokeless Tobacco Never   Counseling given: Not Answered   Outpatient Medications Prior to Visit  Medication Sig Dispense Refill   acetaminophen (TYLENOL) 650 MG CR tablet Take 1,300 mg by mouth 2 (two) times daily.     albuterol (PROVENTIL) (2.5 MG/3ML) 0.083% nebulizer solution Take 3 mLs (2.5 mg total) by nebulization every 4 (four) hours as needed for wheezing or shortness of breath. Dx:J44.9 75 mL 12   albuterol (PROVENTIL) (5 MG/ML) 0.5% nebulizer solution Take 0.5 mLs (2.5 mg total) by nebulization every 6 (six) hours as needed for wheezing or shortness of breath. 20 mL 2   albuterol (VENTOLIN HFA) 108 (90 Base) MCG/ACT inhaler Inhale into the lungs every 6 (six) hours as needed for wheezing or shortness of breath.     azelastine (ASTELIN) 0.1 % nasal spray Place 2 sprays into both nostrils at bedtime as needed for rhinitis. 30 mL 3   Biotin 5 MG TABS Take 5 mg by mouth daily.      Calcium Carbonate-Vitamin D (CALCIUM + D PO) Take 1 tablet by mouth 2 (two) times a week.      Cholecalciferol (VITAMIN D3) 2000 units TABS Take 2,000 Units by mouth daily.      clopidogrel (PLAVIX) 75 MG tablet TAKE 1 TABLET BY MOUTH ONCE A DAY 90 tablet 0   diltiazem (CARDIZEM CD) 240 MG 24 hr capsule TAKE 1 CAPSULE BY MOUTH ONCE DAILY 90 capsule 2   diltiazem (CARDIZEM) 30 MG tablet Take one  tablet by mouth every 4 hours AS NEEDED for A-fib HR > 100 as long as BP > 100. 45 tablet 0   dofetilide (TIKOSYN) 125 MCG capsule TAKE 1 CAPSULE BY MOUTH TWICE DAILY 60 capsule 3   ELIQUIS 2.5 MG TABS tablet TAKE 1 TABLET BY MOUTH TWICE A DAY 60 tablet 5   ezetimibe (ZETIA) 10 MG tablet TAKE 1 TABLET BY MOUTH DAILY 90 tablet 0   ferrous fumarate (HEMOCYTE - 106 MG FE) 325 (106 Fe) MG TABS tablet Take 1 tablet (106 mg of iron total) by mouth  daily. 90 tablet 1   FLUoxetine (PROZAC) 40 MG capsule Take 1 capsule (40 mg total) by mouth daily. 30 capsule 3   Fluticasone-Umeclidin-Vilant (TRELEGY ELLIPTA) 100-62.5-25 MCG/INH AEPB INAHLE 1 PUFF INTO THE LUNGS DAILY 60 each 5   folic acid (FOLVITE) 1 MG tablet Take 1 tablet (1 mg total) by mouth daily. 90 tablet 1   furosemide (LASIX) 20 MG tablet Take 1 tablet (20 mg total) by mouth as directed. Take 1 tablet (20 mg) daily and extra 1 tablet (20 mg) at 2 PM for shortness of breath or abdominal swelling 180 tablet 2   HYDROcodone-acetaminophen (NORCO) 10-325 MG tablet Take 1 tablet by mouth every 6 (six) hours as needed for severe pain. 30 tablet 0   ipratropium (ATROVENT) 0.02 % nebulizer solution Take 2.5 mLs (0.5 mg total) by nebulization 4 (four) times daily. 75 mL 2   isosorbide mononitrate (IMDUR) 60 MG 24 hr tablet TAKE 1 TABLET BY MOUTH TWICE A DAY 180 tablet 2   Krill Oil 500 MG CAPS Take 500 mg by mouth daily.      levothyroxine (SYNTHROID) 75 MCG tablet Take 1 tablet (75 mcg total) by mouth daily. 30 tablet 3   nitroGLYCERIN (NITROSTAT) 0.4 MG SL tablet Place 1 tablet (0.4 mg total) under the tongue every 5 (five) minutes as needed for chest pain. 25 tablet 3   omeprazole (PRILOSEC) 20 MG capsule TAKE 1 CAPSULE BY MOUTH ONCE DAILY 90 capsule 3   Polyethyl Glycol-Propyl Glycol (SYSTANE OP) Place 1 drop into both eyes 2 (two) times daily as needed (dry/irritated eyes.).      potassium chloride (KLOR-CON) 10 MEQ tablet Take 1 tablet (10 mEq total)  by mouth daily. And take 2 tablets (42meq) on days you take furosemide 135 tablet 2   rosuvastatin (CRESTOR) 40 MG tablet TAKE 1 TABLET BY MOUTH ONCE DAILY 90 tablet 0   zinc gluconate 50 MG tablet Take 50 mg by mouth daily.     No facility-administered medications prior to visit.     Review of Systems:   Constitutional:   No  weight loss, night sweats,  Fevers, chills, +fatigue, or  lassitude.  HEENT:   No headaches,  Difficulty swallowing,  Tooth/dental problems, or  Sore throat,                No sneezing, itching, ear ache, nasal congestion, post nasal drip,   CV:  No chest pain,  Orthopnea, PND, swelling in lower extremities, anasarca, dizziness, palpitations, syncope.   GI  No heartburn, indigestion, abdominal pain, nausea, vomiting, diarrhea, change in bowel habits, loss of appetite, bloody stools.   Resp: .  No chest wall deformity  Skin: no rash or lesions.  GU: no dysuria, change in color of urine, no urgency or frequency.  No flank pain, no hematuria   MS:  No joint pain or swelling.  No decreased range of motion.  No back pain.    Physical Exam  BP (!) 120/58 (BP Location: Left Arm, Patient Position: Sitting, Cuff Size: Normal)   Pulse 76   Temp 98.8 F (37.1 C) (Oral)   Ht 5\' 2"  (1.575 m)   Wt 129 lb (58.5 kg)   SpO2 94%   BMI 23.59 kg/m   GEN: A/Ox3; pleasant , NAD, well nourished    HEENT:  [place]/AT,   NOSE-clear, THROAT-clear, no lesions, no postnasal drip or exudate noted.   NECK:  Supple w/ fair ROM; no JVD; normal carotid impulses w/o  bruits; no thyromegaly or nodules palpated; no lymphadenopathy.    RESP  Clear  P & A; w/o, wheezes/ rales/ or rhonchi. no accessory muscle use, no dullness to percussion  CARD:  RRR, no m/r/g, no peripheral edema, pulses intact, no cyanosis or clubbing. +clubbing   GI:   Soft & nt; nml bowel sounds; no organomegaly or masses detected.   Musco: Warm bil, no deformities or joint swelling noted.   Neuro: alert, no  focal deficits noted.    Skin: Warm, no lesions or rashes    Lab Results:  CBC  BMET     Imaging:    PFT Results Latest Ref Rng & Units 07/25/2015 05/24/2013  FVC-Pre L 2.20 2.46  FVC-Predicted Pre % 87 94  FVC-Post L 2.32 2.54  FVC-Predicted Post % 92 97  Pre FEV1/FVC % % 56 54  Post FEV1/FCV % % 57 53  FEV1-Pre L 1.22 1.32  FEV1-Predicted Pre % 65 67  FEV1-Post L 1.31 1.35  DLCO uncorrected ml/min/mmHg 17.19 11.67  DLCO UNC% % 79 54  DLVA Predicted % 56 64  TLC L - 4.50  TLC % Predicted % - 94  RV % Predicted % - 93    No results found for: NITRICOXIDE      Assessment & Plan:   COPD (chronic obstructive pulmonary disease) (HCC) Acute COPD exacerbation-check chest x-ray.  Treat with empiric antibiotics is steroids Check COVID-19 testing.  Plan  Patient Instructions  Chest xray today  Doxycycline 100mg  Twice daily  for 1 week, take with food.  Prednisone 20mg  daily for 5 days  Mucinex DM Twice daily  As needed  cough/congestion  Covid 19 testing , call if positive.  Albuterol inhaler /neb every 6hr as needed.  Continue on TRELEGY 1 puff daily , rinse after use.  Continue on Oxygen 2l/m  CT chest without contrast -lung nodules (within 1 week).  Follow up with Dr. Lamonte Sakai  in 4 weeks and As needed   Please contact office for sooner follow up if symptoms do not improve or worsen or seek emergency care        Lung nodule Lung nodule noted on CT chest November 2021.  Will need a follow-up CT chest.  Plan  Patient Instructions  Chest xray today  Doxycycline 100mg  Twice daily  for 1 week, take with food.  Prednisone 20mg  daily for 5 days  Mucinex DM Twice daily  As needed  cough/congestion  Covid 19 testing , call if positive.  Albuterol inhaler /neb every 6hr as needed.  Continue on TRELEGY 1 puff daily , rinse after use.  Continue on Oxygen 2l/m  CT chest without contrast -lung nodules (within 1 week).  Follow up with Dr. Lamonte Sakai  in 4 weeks and  As needed   Please contact office for sooner follow up if symptoms do not improve or worsen or seek emergency care        Chronic respiratory failure with hypoxia [facility]) Patient is continue on oxygen at 2 L.  No increased oxygen demands.    I spent   42 minutes dedicated to the care of this patient on the date of this encounter to include pre-visit review of records, face-to-face time with the patient discussing conditions above, post visit ordering of testing, clinical documentation with the electronic health record, making appropriate referrals as documented, and communicating necessary findings to members of the patients care team.   Rexene Edison, NP 03/05/2021

## 2021-03-05 NOTE — Patient Instructions (Addendum)
Chest xray today  Doxycycline 100mg  Twice daily  for 1 week, take with food.  Prednisone 20mg  daily for 5 days  Mucinex DM Twice daily  As needed  cough/congestion  Covid 19 testing , call if positive.  Albuterol inhaler /neb every 6hr as needed.  Continue on TRELEGY 1 puff daily , rinse after use.  Continue on Oxygen 2l/m  CT chest without contrast -lung nodules (within 1 week).  Follow up with Dr. Lamonte Sakai  in 4 weeks and As needed   Please contact office for sooner follow up if symptoms do not improve or worsen or seek emergency care

## 2021-03-05 NOTE — Assessment & Plan Note (Signed)
Acute COPD exacerbation-check chest x-ray.  Treat with empiric antibiotics is steroids Check COVID-19 testing.  Plan  Patient Instructions  Chest xray today  Doxycycline 100mg  Twice daily  for 1 week, take with food.  Prednisone 20mg  daily for 5 days  Mucinex DM Twice daily  As needed  cough/congestion  Covid 19 testing , call if positive.  Albuterol inhaler /neb every 6hr as needed.  Continue on TRELEGY 1 puff daily , rinse after use.  Continue on Oxygen 2l/m  CT chest without contrast -lung nodules (within 1 week).  Follow up with Dr. Lamonte Sakai  in 4 weeks and As needed   Please contact office for sooner follow up if symptoms do not improve or worsen or seek emergency care

## 2021-03-06 ENCOUNTER — Other Ambulatory Visit: Payer: Self-pay | Admitting: *Deleted

## 2021-03-06 ENCOUNTER — Other Ambulatory Visit (INDEPENDENT_AMBULATORY_CARE_PROVIDER_SITE_OTHER): Payer: Medicare HMO

## 2021-03-06 ENCOUNTER — Telehealth: Payer: Self-pay | Admitting: *Deleted

## 2021-03-06 DIAGNOSIS — R918 Other nonspecific abnormal finding of lung field: Secondary | ICD-10-CM

## 2021-03-06 LAB — BASIC METABOLIC PANEL
BUN: 10 mg/dL (ref 6–23)
CO2: 30 mEq/L (ref 19–32)
Calcium: 9.3 mg/dL (ref 8.4–10.5)
Chloride: 99 mEq/L (ref 96–112)
Creatinine, Ser: 0.78 mg/dL (ref 0.40–1.20)
GFR: 70.96 mL/min (ref >60.00)
Glucose, Bld: 112 mg/dL — ABNORMAL HIGH (ref 70–99)
Potassium: 3.9 mEq/L (ref 3.5–5.1)
Sodium: 136 mEq/L (ref 135–145)

## 2021-03-06 NOTE — Telephone Encounter (Signed)
ATC patient, left message for her to return call regarding appointment with Rexene Edison NP for 03/07/21 to review results of CT scan.  When she returns call please make sure she can be here tomorrow afternoon at 2:45 pm to see Tammy to review results of CT scan.  Will await return call from patient.

## 2021-03-06 NOTE — Progress Notes (Signed)
ATC patient x1.  TP has attempted to reach the patient x2.  She is scheduled for a super D CT tomorrow (03/07/21 at 10 am).  Will discuss the results at that time.

## 2021-03-07 ENCOUNTER — Ambulatory Visit: Payer: Medicare HMO | Admitting: Adult Health

## 2021-03-07 ENCOUNTER — Encounter: Payer: Self-pay | Admitting: Adult Health

## 2021-03-07 ENCOUNTER — Ambulatory Visit (INDEPENDENT_AMBULATORY_CARE_PROVIDER_SITE_OTHER)
Admission: RE | Admit: 2021-03-07 | Discharge: 2021-03-07 | Disposition: A | Discharge status: Home / Self Care | Payer: Medicare HMO | Source: Ambulatory Visit | Attending: Adult Health | Admitting: Adult Health

## 2021-03-07 ENCOUNTER — Other Ambulatory Visit: Payer: Self-pay

## 2021-03-07 VITALS — BP 122/68 | HR 66 | Temp 97.8°F | Ht 62.0 in | Wt 130.6 lb

## 2021-03-07 DIAGNOSIS — J90 Pleural effusion, not elsewhere classified: Secondary | ICD-10-CM | POA: Diagnosis not present

## 2021-03-07 DIAGNOSIS — R911 Solitary pulmonary nodule: Secondary | ICD-10-CM

## 2021-03-07 DIAGNOSIS — J9611 Chronic respiratory failure with hypoxia: Secondary | ICD-10-CM | POA: Diagnosis not present

## 2021-03-07 DIAGNOSIS — R918 Other nonspecific abnormal finding of lung field: Secondary | ICD-10-CM | POA: Diagnosis not present

## 2021-03-07 DIAGNOSIS — K449 Diaphragmatic hernia without obstruction or gangrene: Secondary | ICD-10-CM | POA: Diagnosis not present

## 2021-03-07 DIAGNOSIS — J432 Centrilobular emphysema: Secondary | ICD-10-CM | POA: Diagnosis not present

## 2021-03-07 DIAGNOSIS — J449 Chronic obstructive pulmonary disease, unspecified: Secondary | ICD-10-CM

## 2021-03-07 DIAGNOSIS — I2510 Atherosclerotic heart disease of native coronary artery without angina pectoris: Secondary | ICD-10-CM | POA: Diagnosis not present

## 2021-03-07 MED ORDER — IOHEXOL 350 MG/ML SOLN
80.0000 mL | Freq: Once | INTRAVENOUS | Status: AC | PRN
  Administered 2021-03-07: 80 mL via INTRAVENOUS

## 2021-03-07 NOTE — Assessment & Plan Note (Signed)
Acute COPD exacerbation patient does have a flare with an associated left large lung mass.- Patient is to finish antibiotics and steroid as discussed.  Continue on her triple therapy maintenance regimen.  Plan  Patient Instructions  Set up PET scan.  Finish Doxycycline and Prednisone.  Mucinex DM Twice daily  As needed  cough/congestion  Albuterol inhaler /neb every 6hr as needed.  Continue on TRELEGY 1 puff daily , rinse after use.  Continue on Oxygen 2l/m  Follow up with Dr. Lamonte Sakai  in 3 weeks and As needed   Please contact office for sooner follow up if symptoms do not improve or worsen or seek emergency care

## 2021-03-07 NOTE — Telephone Encounter (Signed)
I called the patient to make a follow up and spoke with patient to let her know about the follow up in the office. She did not have any other questions. She reported that she will be here in the office for the visit.

## 2021-03-07 NOTE — Assessment & Plan Note (Signed)
Continue on oxygen 2 L 

## 2021-03-07 NOTE — Patient Instructions (Addendum)
Set up PET scan.  Finish Doxycycline and Prednisone.  Mucinex DM Twice daily  As needed  cough/congestion  Albuterol inhaler /neb every 6hr as needed.  Continue on TRELEGY 1 puff daily , rinse after use.  Continue on Oxygen 2l/m  Follow up with Dr. Lamonte Sakai  in 3 weeks and As needed   Please contact office for sooner follow up if symptoms do not improve or worsen or seek emergency care

## 2021-03-07 NOTE — Progress Notes (Signed)
@Patient  ID: Andrea [name], female    DOB: March 10, 1941, 80 y.o.   MRN: [id number]  Chief Complaint  Patient presents with   Follow-up    Referring provider: Mosie Lukes, MD  HPI: 80 year old female former smoker followed for COPD and chronic respiratory failure on oxygen Medical history significant for A. fib on Eliquis and coronary disease on Plavix 5 TEST/EVENTS :  PFTs July 25, 2015 showed moderate airflow obstruction with FEV1 at 69%, ratio 57, FVC 92%, no significant bronchodilator response, mid flow reversibility, DLCO 79%.   CT chest July 2011 biapical emphysema, peripheral nodularity within the lingula, right middle and upper lobe are stable dating back to 2007.   CT chest angio abdomen and pelvis November 2021 1.3 and 1.0 cm left upper lobe nodules.  Small right pleural effusion, emphysema, 3.8 cm abdominal aortic aneurysm  03/07/2021 Follow up : COPD exacerbation, lung mass Patient returns for 2-day follow-up.  Patient was seen last visit for a 3-week history of increased cough congestion with thick mucus.  She also had some blood-tinged mucus.  Patient was treated with doxycycline and prednisone 20 mg x 5 days.  Chest x-ray was done and showed large mass within the left upper lobe measuring 8.7 cm . Patient was set up for a CT chest that was completed this morning results are pending. Since last visit patient says she is doing about the same.  Her cough and congestion continue on a daily basis.  She has some pleuritic pain along the left lateral and subscapular region. She denies any frank hemoptysis.  Patient does remain on Eliquis and Plavix. Patient has had no significant weight loss.  We went over her chest x-ray results.  Also reviewed her CT scans and need to proceed with a PET scan.  Patient education was given.  Support was provided. Patient has had several family members passed away from lung cancer and other types of cancer.   Allergies  Allergen Reactions    Erythromycin Other (See Comments)    Made the patient's tongue "burn"   Tape Rash and Other (See Comments)    PLEASE USE PAPER TAPE!!   Meperidine Nausea And Vomiting   Shellfish Allergy Nausea And Vomiting   Ciprofloxacin Rash and Other (See Comments)    Rash from IV   Codeine Nausea And Vomiting   Latex Rash and Other (See Comments)    Made the patient's skin "burn," also   Penicillins Rash and Other (See Comments)    Has patient had a PCN reaction causing immediate rash, facial/tongue/throat swelling, SOB or lightheadedness with hypotension: Unk Has patient had a PCN reaction causing severe rash involving mucus membranes or skin necrosis: No Has patient had a PCN reaction that required hospitalization: No Has patient had a PCN reaction occurring within the last 10 years: No If all of the above answers are "NO", then may proceed with Cephalosporin use.     Immunization History  Administered Date(s) Administered   Fluad Quad(high Dose 65+) 04/07/2019   Influenza,inj,Quad PF,6+ Mos 04/15/2016   PFIZER(Purple Top)SARS-COV-2 Vaccination 07/20/2019, 08/10/2019, 11/29/2020   Pneumococcal Conjugate-13 01/24/2014   Pneumococcal Polysaccharide-23 05/25/2002, 12/01/2017   Td 05/24/1999   Tdap 01/24/2014   Zoster Recombinat (Shingrix) 11/29/2020    Past Medical History:  Diagnosis Date   AAA (abdominal aortic aneurysm) 01/2009   AAA (2.8 x 3.0)  moderate RAS (left); 2.7 x 2.7 cm (07/11/05)   Acute bronchitis 03/20/2013; 2017   Angina    Anosmia  Asthma    Baker's cyst of knee 01/30/2014   Basal cell carcinoma    "back and left arm"   Bradycardia    Metoprolol stopped 08/2011   CAD (coronary artery disease)    s/CABG (reports IMA and 2 SVGs) back in 1999  Myoview normal 3/10; s/p PCI with DES to PL branch of distal RCA 09/2011; PCI +DES to SVG-RCA, PCI + DES to mid LCx 12/2015   Cerebrovascular disease 01/2009   carotid u/s  R 0-39%   L 60-79%   Chronic thoracic back pain    COPD  (chronic obstructive pulmonary disease) (HCC)    Depression    Dizziness    Dysrhythmia    hx of sinus brady   GERD (gastroesophageal reflux disease)    Heart murmur    Herniated lumbar disc without myelopathy    History of blood transfusion 1999   "when I had the bypass; had a PE"   Hyperglycemia 10/23/2015   Hyperlipidemia    Hypertension    Hypothyroidism 09/30/2016   Medicare annual wellness visit, subsequent 07/31/2013   NSTEMI (non-ST elevated myocardial infarction) ([place]) 11/12   Cath showed atretic IMA graft to the LAD, SVG to PD was patent but the continuation of this graft to the PL branch was occluded; there were L to R collaterals; Mid LAD had a 60 to 70% stenosis. She has been treated medically.  Neg Myoview 05/2011   Osteoarthritis of back    Osteoporosis    Pneumonia "several times"   Pulmonary embolism ([place]) 1999   "after my bypass"   PVD (peripheral vascular disease) ([place])    Shortness of breath    Squamous carcinoma    "nose"   Thyroid disease    Hypothyroid   Unstable angina ([place]) 09/25/2015    Tobacco History: Social History   Tobacco Use  Smoking Status Former   Packs/day: 0.50   Years: 40.00   Pack years: 20.00   Types: Cigarettes   Start date: 16   Quit date: 01/19/1993   Years since quitting: 28.1  Smokeless Tobacco Never   Counseling given: Not Answered   Outpatient Medications Prior to Visit  Medication Sig Dispense Refill   acetaminophen (TYLENOL) 650 MG CR tablet Take 1,300 mg by mouth 2 (two) times daily.     albuterol (PROVENTIL) (2.5 MG/3ML) 0.083% nebulizer solution Take 3 mLs (2.5 mg total) by nebulization every 4 (four) hours as needed for wheezing or shortness of breath. Dx:J44.9 75 mL 12   albuterol (PROVENTIL) (5 MG/ML) 0.5% nebulizer solution Take 0.5 mLs (2.5 mg total) by nebulization every 6 (six) hours as needed for wheezing or shortness of breath. 20 mL 2   albuterol (VENTOLIN HFA) 108 (90 Base) MCG/ACT inhaler Inhale into the  lungs every 6 (six) hours as needed for wheezing or shortness of breath.     albuterol (VENTOLIN HFA) 108 (90 Base) MCG/ACT inhaler Inhale 2 puffs into the lungs every 6 (six) hours as needed for wheezing or shortness of breath. 18 g 3   azelastine (ASTELIN) 0.1 % nasal spray Place 2 sprays into both nostrils at bedtime as needed for rhinitis. 30 mL 3   Biotin 5 MG TABS Take 5 mg by mouth daily.      Calcium Carbonate-Vitamin D (CALCIUM + D PO) Take 1 tablet by mouth 2 (two) times a week.      Cholecalciferol (VITAMIN D3) 2000 units TABS Take 2,000 Units by mouth daily.  clopidogrel (PLAVIX) 75 MG tablet TAKE 1 TABLET BY MOUTH ONCE A DAY 90 tablet 0   diltiazem (CARDIZEM CD) 240 MG 24 hr capsule TAKE 1 CAPSULE BY MOUTH ONCE DAILY 90 capsule 2   diltiazem (CARDIZEM) 30 MG tablet Take one tablet by mouth every 4 hours AS NEEDED for A-fib HR > 100 as long as BP > 100. 45 tablet 0   dofetilide (TIKOSYN) 125 MCG capsule TAKE 1 CAPSULE BY MOUTH TWICE DAILY 60 capsule 3   doxycycline (VIBRA-TABS) 100 MG tablet Take 1 tablet (100 mg total) by mouth 2 (two) times daily. 14 tablet 0   ELIQUIS 2.5 MG TABS tablet TAKE 1 TABLET BY MOUTH TWICE A DAY 60 tablet 5   ezetimibe (ZETIA) 10 MG tablet TAKE 1 TABLET BY MOUTH DAILY 90 tablet 0   ferrous fumarate (HEMOCYTE - 106 MG FE) 325 (106 Fe) MG TABS tablet Take 1 tablet (106 mg of iron total) by mouth daily. 90 tablet 1   FLUoxetine (PROZAC) 40 MG capsule Take 1 capsule (40 mg total) by mouth daily. 30 capsule 3   Fluticasone-Umeclidin-Vilant (TRELEGY ELLIPTA) 100-62.5-25 MCG/INH AEPB INAHLE 1 PUFF INTO THE LUNGS DAILY 60 each 5   folic acid (FOLVITE) 1 MG tablet Take 1 tablet (1 mg total) by mouth daily. 90 tablet 1   furosemide (LASIX) 20 MG tablet Take 1 tablet (20 mg total) by mouth as directed. Take 1 tablet (20 mg) daily and extra 1 tablet (20 mg) at 2 PM for shortness of breath or abdominal swelling 180 tablet 2   HYDROcodone-acetaminophen (NORCO) 10-325  MG tablet Take 1 tablet by mouth every 6 (six) hours as needed for severe pain. 30 tablet 0   ipratropium (ATROVENT) 0.02 % nebulizer solution Take 2.5 mLs (0.5 mg total) by nebulization 4 (four) times daily. 75 mL 2   isosorbide mononitrate (IMDUR) 60 MG 24 hr tablet TAKE 1 TABLET BY MOUTH TWICE A DAY 180 tablet 2   Krill Oil 500 MG CAPS Take 500 mg by mouth daily.      levothyroxine (SYNTHROID) 75 MCG tablet Take 1 tablet (75 mcg total) by mouth daily. 30 tablet 3   nitroGLYCERIN (NITROSTAT) 0.4 MG SL tablet Place 1 tablet (0.4 mg total) under the tongue every 5 (five) minutes as needed for chest pain. 25 tablet 3   omeprazole (PRILOSEC) 20 MG capsule TAKE 1 CAPSULE BY MOUTH ONCE DAILY 90 capsule 3   Polyethyl Glycol-Propyl Glycol (SYSTANE OP) Place 1 drop into both eyes 2 (two) times daily as needed (dry/irritated eyes.).      potassium chloride (KLOR-CON) 10 MEQ tablet Take 1 tablet (10 mEq total) by mouth daily. And take 2 tablets (20meq) on days you take furosemide 135 tablet 2   predniSONE (DELTASONE) 20 MG tablet Take 1 tablet (20 mg total) by mouth daily with breakfast. 5 tablet 0   rosuvastatin (CRESTOR) 40 MG tablet TAKE 1 TABLET BY MOUTH ONCE DAILY 90 tablet 0   zinc gluconate 50 MG tablet Take 50 mg by mouth daily.     No facility-administered medications prior to visit.     Review of Systems:   Constitutional:   No  weight loss, night sweats,  Fevers, chills,  +fatigue, or  lassitude.  HEENT:   No headaches,  Difficulty swallowing,  Tooth/dental problems, or  Sore throat,                No sneezing, itching, ear ache, nasal congestion, post nasal drip,  CV:  No chest pain,  Orthopnea, PND, swelling in lower extremities, anasarca, dizziness, palpitations, syncope.   GI  No heartburn, indigestion, abdominal pain, nausea, vomiting, diarrhea, change in bowel habits, loss of appetite, bloody stools.   Resp:  No wheezing.  No chest wall deformity  Skin: no rash or  lesions.  GU: no dysuria, change in color of urine, no urgency or frequency.  No flank pain, no hematuria   MS:  No joint pain or swelling.  No decreased range of motion.  No back pain.    Physical Exam  BP 122/68 (BP Location: Left Arm, Cuff Size: Normal)   Pulse 66   Temp 97.8 F (36.6 C) (Oral)   Ht 5\' 2"  (1.575 m)   Wt 130 lb 9.6 oz (59.2 kg)   SpO2 93%   BMI 23.89 kg/m   GEN: A/Ox3; pleasant , NAD, well nourished    HEENT:  [place]/AT,  NOSE-clear, THROAT-clear, no lesions, no postnasal drip or exudate noted.   NECK:  Supple w/ fair ROM; no JVD; normal carotid impulses w/o bruits; no thyromegaly or nodules palpated; no lymphadenopathy.    RESP  Clear  P & A; w/o, wheezes/ rales/ or rhonchi. no accessory muscle use, no dullness to percussion  CARD:  RRR, no m/r/g, no peripheral edema, pulses intact, no cyanosis or clubbing.  GI:   Soft & nt; nml bowel sounds; no organomegaly or masses detected.   Musco: Warm bil, no deformities or joint swelling noted.   Neuro: alert, no focal deficits noted.    Skin: Warm, no lesions or rashes    Lab Results:  CBC    Imaging: DG Chest 2 View  Result Date: 03/05/2021 CLINICAL DATA:  Hemoptysis.  Shortness of breath. EXAM: CHEST - 2 VIEW COMPARISON:  04/10/2020 FINDINGS: Signs of previous median sternotomy and CABG procedure. Normal heart size. No pleural effusion or edema. There is a large mass within the left upper lobe measuring 8.7 cm concerning for malignancy. On previous CT from 04/10/2020 there was a 1.3 cm left upper lobe lung nodule. Surrounding increased interstitial thickening is identified. Right lung appears clear. IMPRESSION: 1. Large mass within the left upper lobe concerning for malignancy. Further evaluation with contrast enhanced CT of the chest is recommended. Electronically Signed   By: Kerby Moors M.D.   On: 03/05/2021 10:54   DG Bone Density  Result Date: 02/11/2021 EXAM: DUAL X-RAY ABSORPTIOMETRY (DXA) FOR  BONE MINERAL DENSITY IMPRESSION: STACEY A BLYTH Your patient Andrea [name] completed a BMD test on 02/11/2021 using the [place] (analysis version: 16.SP2) manufactured by [employer]. The following summarizes the results of our evaluation. [facility] PATIENT: Name: Andrea [name], Andrea [name] Patient ID: [id number] Birth Date: [birth year] Height: 61.0 in. Gender: Female Measured: 02/11/2021 Weight: 130.6 lbs. Indications: Advanced Age, Caucasian, Estrogen Deficiency, History of Osteopenia, Hypothyroidism, Post Menopausal, Previous Tobacco User Fractures: Treatments: Calcium, Levothyroxine, Vitamin D ASSESSMENT: The BMD measured at Femur Neck Left is 0.768 g/cm2 with a T-score of -1.9. This patient is considered osteopenic according to [facility]) criteria. Compared with the prior study on, 10/29/2015 the BMD of the total mean shows a statistically significant decrease. The scan quality is good. Site Region Measured Date Measured Age WHO YA BMD Classification T-score AP Spine L1-L4 02/11/2021 81.9 Osteopenia -1.4 1.009 g/cm2 AP Spine L1-L4 10/29/2015 76.6 Osteopenia -1.2 1.041 g/cm2 DualFemur Neck Left 02/11/2021 81.9 Osteopenia -1.9 0.768 g/cm2 DualFemur Neck Left 10/29/2015 76.6 Osteopenia -1.8 0.791 g/cm2 DualFemur Total  Mean 02/11/2021 81.9 Osteopenia -1.6 0.810 g/cm2 DualFemur Total Mean 10/29/2015 76.6 Osteopenia -1.2 0.855 g/cm2 World Health Organization [facility]) criteria for post-menopausal, Caucasian Women: Normal       T-score at or above -1 SD Osteopenia   T-score between -1 and -2.5 SD Osteoporosis T-score at or below -2.5 SD RECOMMENDATION: 1. All patients should optimize calcium and vitamin D intake. 2. Consider FDA-approved medical therapies in postmenopausal women and men aged 78 years and older, based on the following: a. A hip or vertebral(clinical or morphometric) fracture. b. T-Score < -2.5 at the femoral neck or spine after appropriate evaluation to exclude secondary causes c. Low bone  mass (T-score between -1.0 and -2.5 at the femoral neck or spine) and a 10 year probability of a hip fracture >3% or a 10 year probability of major osteoporosis-related fracture > 20% based on the [place]-adapted WHO algorithm d. Clinical judgement and/or patient preferences may indicate treatment for people with 10-year fracture probabilities above or below these levels FOLLOW-UP: Patients with diagnosis of osteoporosis or at high risk for fracture should have regular bone mineral density tests. For patients eligible for Medicare, routine testing is allowed once every 2 years. The testing frequency can be increased to one year for patients who have rapidly progressing disease, those who are receiving or discontinuing medical therapy to restore bone mass, or have additional risk factors. I have reviewed this report, and agree with the above findings. [facility] Patient: Andrea [name]   Referring Physician: Mosie Lukes Birth Date: 14-Dec-1940 [age] Patient ID: [id number] Height: 61.0 in. Weight: 130.6 lbs. Measured: 02/11/2021 2:12:27 PM (16 SP 4) Gender: Female Ethnicity: White Analyzed: 02/11/2021 2:15:57 PM (16 SP 4) FRAX* 10-year Probability of Fracture Based on femoral neck BMD: DualFemur (Left) Major Osteoporotic Fracture: 15.8% Hip Fracture:                4.8% Population:                  [place] (Caucasian) Risk Factors:                None *FRAX is a Materials engineer of the [employer] for Metabolic Bone Disease, a World Pharmacologist (WHO) [facility]. ASSESSMENT: The probability of a major osteoporotic fracture is 15.8% within the next ten years. The probability of a hip fracture is 4.8% within the next ten years. Electronically Signed   By: Rolm Baptise M.D.   On: 02/11/2021 16:58   MM 3D SCREEN BREAST BILATERAL  Result Date: 02/15/2021 CLINICAL DATA:  Screening. EXAM: DIGITAL SCREENING BILATERAL MAMMOGRAM WITH TOMOSYNTHESIS AND CAD  TECHNIQUE: Bilateral screening digital craniocaudal and mediolateral oblique mammograms were obtained. Bilateral screening digital breast tomosynthesis was performed. The images were evaluated with computer-aided detection. COMPARISON:  Previous exam(s). ACR Breast Density Category c: The breast tissue is heterogeneously dense, which may obscure small masses. FINDINGS: There are no findings suspicious for malignancy. IMPRESSION: No mammographic evidence of malignancy. A result letter of this screening mammogram will be mailed directly to the patient. RECOMMENDATION: Screening mammogram in one year. (Code:SM-B-01Y) BI-RADS CATEGORY  1: Negative. Electronically Signed   By: Lajean Manes M.D.   On: 02/15/2021 12:59      PFT Results Latest Ref Rng & Units 07/25/2015 05/24/2013  FVC-Pre L 2.20 2.46  FVC-Predicted Pre % 87 94  FVC-Post L 2.32 2.54  FVC-Predicted Post % 92 97  Pre FEV1/FVC % %  56 54  Post FEV1/FCV % % 57 53  FEV1-Pre L 1.22 1.32  FEV1-Predicted Pre % 65 67  FEV1-Post L 1.31 1.35  DLCO uncorrected ml/min/mmHg 17.19 11.67  DLCO UNC% % 79 54  DLVA Predicted % 56 64  TLC L - 4.50  TLC % Predicted % - 94  RV % Predicted % - 93    No results found for: NITRICOXIDE      Assessment & Plan:   COPD (chronic obstructive pulmonary disease) (HCC) Acute COPD exacerbation patient does have a flare with an associated left large lung mass.- Patient is to finish antibiotics and steroid as discussed.  Continue on her triple therapy maintenance regimen.  Plan  Patient Instructions  Set up PET scan.  Finish Doxycycline and Prednisone.  Mucinex DM Twice daily  As needed  cough/congestion  Albuterol inhaler /neb every 6hr as needed.  Continue on TRELEGY 1 puff daily , rinse after use.  Continue on Oxygen 2l/m  Follow up with Dr. Lamonte Sakai  in 3 weeks and As needed   Please contact office for sooner follow up if symptoms do not improve or worsen or seek emergency care        Lung  mass Large left upper lobe lung mass concerning for underlying malignancy. Final CT results are pending.  Reviewed scans with Dr. Lamonte Sakai.  PET scan is pending.  Once results are returned we will decide on next step for tissue sampling.  Plan  Patient Instructions  Set up PET scan.  Finish Doxycycline and Prednisone.  Mucinex DM Twice daily  As needed  cough/congestion  Albuterol inhaler /neb every 6hr as needed.  Continue on TRELEGY 1 puff daily , rinse after use.  Continue on Oxygen 2l/m  Follow up with Dr. Lamonte Sakai  in 3 weeks and As needed   Please contact office for sooner follow up if symptoms do not improve or worsen or seek emergency care        Chronic respiratory failure with hypoxia ([place]) Continue on oxygen 2 L.     Rexene Edison, NP 03/07/2021

## 2021-03-07 NOTE — Assessment & Plan Note (Signed)
Large left upper lobe lung mass concerning for underlying malignancy. Final CT results are pending.  Reviewed scans with Dr. Lamonte Sakai.  PET scan is pending.  Once results are returned we will decide on next step for tissue sampling.  Plan  Patient Instructions  Set up PET scan.  Finish Doxycycline and Prednisone.  Mucinex DM Twice daily  As needed  cough/congestion  Albuterol inhaler /neb every 6hr as needed.  Continue on TRELEGY 1 puff daily , rinse after use.  Continue on Oxygen 2l/m  Follow up with Dr. Lamonte Sakai  in 3 weeks and As needed   Please contact office for sooner follow up if symptoms do not improve or worsen or seek emergency care

## 2021-03-08 ENCOUNTER — Telehealth: Payer: Self-pay | Admitting: Emergency Medicine

## 2021-03-08 NOTE — Telephone Encounter (Signed)
I received a call from Andrea [name] from [place] and he made sure the provider could see the results.   See results in chart.Please advise.

## 2021-03-11 NOTE — Telephone Encounter (Signed)
Reviewed at office visit PET scan pending

## 2021-03-21 ENCOUNTER — Ambulatory Visit (HOSPITAL_COMMUNITY)
Admission: RE | Admit: 2021-03-21 | Discharge: 2021-03-21 | Disposition: A | Discharge status: Home / Self Care | Payer: Medicare HMO | Source: Ambulatory Visit | Attending: Adult Health | Admitting: Adult Health

## 2021-03-21 DIAGNOSIS — C7951 Secondary malignant neoplasm of bone: Secondary | ICD-10-CM | POA: Insufficient documentation

## 2021-03-21 DIAGNOSIS — R911 Solitary pulmonary nodule: Secondary | ICD-10-CM

## 2021-03-21 DIAGNOSIS — I7143 Infrarenal abdominal aortic aneurysm, without rupture: Secondary | ICD-10-CM | POA: Diagnosis not present

## 2021-03-21 DIAGNOSIS — C3412 Malignant neoplasm of upper lobe, left bronchus or lung: Secondary | ICD-10-CM | POA: Insufficient documentation

## 2021-03-21 DIAGNOSIS — J90 Pleural effusion, not elsewhere classified: Secondary | ICD-10-CM | POA: Insufficient documentation

## 2021-03-21 DIAGNOSIS — R918 Other nonspecific abnormal finding of lung field: Secondary | ICD-10-CM | POA: Diagnosis not present

## 2021-03-21 LAB — GLUCOSE, CAPILLARY: Glucose-Capillary: 125 mg/dL — ABNORMAL HIGH (ref 70–99)

## 2021-03-21 MED ORDER — FLUDEOXYGLUCOSE F - 18 (FDG) INJECTION
6.7000 | Freq: Once | INTRAVENOUS | Status: AC | PRN
  Administered 2021-03-21: 6.7 via INTRAVENOUS

## 2021-03-22 ENCOUNTER — Telehealth: Payer: Self-pay | Admitting: Adult Health

## 2021-03-22 MED ORDER — ONDANSETRON HCL 4 MG PO TABS: 4.0000 mg | ORAL_TABLET | Freq: Three times a day (TID) | ORAL | 0 refills | Status: DC | PRN

## 2021-03-22 NOTE — Telephone Encounter (Signed)
Zofran 4mg  1 every 8hr as needed for nausea  #20  no refills  Please contact office for sooner follow up if symptoms do not improve or worsen or seek emergency care

## 2021-03-22 NOTE — Telephone Encounter (Signed)
RB patient last seen TP on 03/07/2021 for lung nodule and COPD.  Patient reports of ongoing nausea. She occ vomits in the morning. Sx have been present for 6 weeks.   She is requesting a medication to help with nausea.   Tammy, please advise. thanks

## 2021-03-22 NOTE — Telephone Encounter (Signed)
Rx sent to preferred pharmacy.  Patient is aware and voiced her understanding.  Nothing further needed.

## 2021-03-23 DIAGNOSIS — I509 Heart failure, unspecified: Secondary | ICD-10-CM | POA: Diagnosis not present

## 2021-03-23 DIAGNOSIS — J449 Chronic obstructive pulmonary disease, unspecified: Secondary | ICD-10-CM | POA: Diagnosis not present

## 2021-04-01 ENCOUNTER — Telehealth: Payer: Medicare HMO | Admitting: Family Medicine

## 2021-04-02 ENCOUNTER — Encounter: Payer: Self-pay | Admitting: Emergency Medicine

## 2021-04-02 ENCOUNTER — Other Ambulatory Visit: Payer: Self-pay | Admitting: Family Medicine

## 2021-04-02 ENCOUNTER — Other Ambulatory Visit: Payer: Self-pay

## 2021-04-02 ENCOUNTER — Ambulatory Visit: Payer: Medicare HMO | Admitting: Emergency Medicine

## 2021-04-02 VITALS — BP 120/70 | HR 79 | Temp 98.1°F | Ht 62.0 in | Wt 124.0 lb

## 2021-04-02 DIAGNOSIS — R918 Other nonspecific abnormal finding of lung field: Secondary | ICD-10-CM

## 2021-04-02 MED ORDER — HYDROCODONE-ACETAMINOPHEN 10-325 MG PO TABS: 1.0000 | ORAL_TABLET | Freq: Four times a day (QID) | ORAL | 0 refills | Status: DC | PRN

## 2021-04-02 NOTE — Progress Notes (Signed)
Subjective:    [name], female    DOB: February 28, 1941, 80 y.o.   MRN: [id number]  HPI 80 year old former smoker (30 pack years) with a history of COPD, OSA on CPAP.  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  She had a pulmonary embolism after her CABG.  She is changing physicians from Dr. Mortimer Fries from [place].  She is on Stiolto. Uses albuterol nebs 1-2x a day with some relief. 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  She has oxygen available use which she uses with most exertion. Wears her CPAP reliably, still feels good benefit from it. She flares about once a year. She was once on an extended course of prednisone after her CABG and PE's, ? For pleuritic pain. Minimal cough. She does wheeze, most days, often in the am - responds to SABA.    ROV 04/02/21 --follow-up visit for 80 year old former smoker with COPD.  Also with obstructive sleep apnea, not using CPAP.  Past medical history significant for CAD/CABG, pulmonary embolism, AAA, hypertension, CVA and PVD.  Last seen in our office 03/07/2021.  She is been having some cough, blood-tinged mucus, was treated for an exacerbation COPD.  Chest x-ray showed a possible left lung mass prompting CT chest 03/07/2021 as below, then a PET scan 03/21/2021.   CT chest 03/07/2021 reviewed by me, shows 2 left lower lobe masses with questionable direct invasion into the superior 7 left lower lobe, left hilar mediastinal adenopathy.  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, trace left pleural effusion, right posterior ninth rib metastases, 8 mm groundglass posterior right upper lobe nodule   Review of Systems As per HPI  Past Medical History:  Diagnosis Date   AAA (abdominal aortic aneurysm)  01/2009   AAA (2.8 x 3.0)  moderate RAS (left); 2.7 x 2.7 cm (07/11/05)   Acute bronchitis 03/20/2013; 2017   Angina    Anosmia    Asthma    Baker's cyst of knee 01/30/2014   Basal cell carcinoma    "back and left arm"   Bradycardia    Metoprolol stopped 08/2011   CAD (coronary artery disease)    s/CABG (reports IMA and 2 SVGs) back in 1999  Myoview normal 3/10; s/p PCI with DES to PL branch of distal RCA 09/2011; PCI +DES to SVG-RCA, PCI + DES to mid LCx 12/2015   Cerebrovascular disease 01/2009   carotid u/s  R 0-39%   L 60-79%   Chronic thoracic back pain    COPD (chronic obstructive pulmonary disease) (HCC)    Depression    Dizziness    Dysrhythmia    hx of sinus brady   GERD (gastroesophageal reflux disease)    Heart murmur    Herniated lumbar disc without myelopathy    History of blood transfusion 1999   "when I had the bypass; had a PE"   Hyperglycemia 10/23/2015   Hyperlipidemia    Hypertension    Hypothyroidism 09/30/2016   Medicare annual wellness visit, subsequent 07/31/2013   NSTEMI (non-ST elevated myocardial infarction) ([place]) 11/12   Cath showed atretic IMA graft to the LAD, SVG to PD was patent but the continuation of this graft to the PL branch was occluded; there were L to R collaterals; Mid LAD had a 60 to 70%  stenosis. She has been treated medically.  Neg Myoview 05/2011   Osteoarthritis of back    Osteoporosis    Pneumonia "several times"   Pulmonary embolism ([place]) 1999   "after my bypass"   PVD (peripheral vascular disease) ([place])    Shortness of breath    Squamous carcinoma    "nose"   Thyroid disease    Hypothyroid   Unstable angina ([place]) 09/25/2015     Family History  Problem Relation Age of Onset   Heart attack Mother 72   Diabetes Mother    Colon cancer Father 29   Lung cancer Father        smoked   Heart disease Father    Hypertension Father    Angina Father    Lung cancer Sister        smoked   Hypothyroidism Sister    Hypertension Brother     Heart attack Brother    Heart disease Brother        PTCA with Stent   Heart attack Brother        CABG   Heart disease Brother        CABG with 1 bypass   Aneurysm Brother        brain   Alcohol abuse Brother    Hyperlipidemia Daughter    Diabetes Maternal Grandmother    Stroke Maternal Grandmother    Cirrhosis Maternal Grandfather    Stroke Paternal Grandmother    Obesity Daughter    Obesity Son      Social History   Socioeconomic History   Marital status: Married    Spouse name: Not on file   Number of children: 5   Years of education: Not on file   Highest education level: Not on file  Occupational History   Occupation: Retired    Fish farm manager: RETIRED    Comment: former  Marine scientist  Tobacco Use   Smoking status: Former    Packs/day: 0.50    Years: 40.00    Pack years: 20.00    Types: Cigarettes    Start date: 1958    Quit date: 01/19/1993    Years since quitting: 28.2   Smokeless tobacco: Never  Vaping Use   Vaping Use: Never used  Substance and Sexual Activity   Alcohol use: Yes    Comment: 01/02/2016 "might drink a glass of wine socially q 3-4 months"   Drug use: No   Sexual activity: Not Currently    Birth control/protection: Post-menopausal  Other Topics Concern   Not on file  Social History Narrative   Married with 5 children   Social Determinants of Health   Financial Resource Strain: Not on file  Food Insecurity: Not on file  Transportation Needs: Not on file  Physical Activity: Not on file  Stress: Not on file  Social Connections: Not on file  Intimate Partner Violence: Not on file     Allergies  Allergen Reactions   Erythromycin Other (See Comments)    Made the patient's tongue "burn"   Tape Rash and Other (See Comments)    PLEASE USE PAPER TAPE!!   Meperidine Nausea And Vomiting   Shellfish Allergy Nausea And Vomiting   Ciprofloxacin Rash and Other (See Comments)    Rash from IV   Codeine Nausea And Vomiting   Latex Rash and Other (See  Comments)    Made the patient's skin "burn," also   Penicillins Rash and Other (See Comments)    Has patient had  a PCN reaction causing immediate rash, facial/tongue/throat swelling, SOB or lightheadedness with hypotension: Unk Has patient had a PCN reaction causing severe rash involving mucus membranes or skin necrosis: No Has patient had a PCN reaction that required hospitalization: No Has patient had a PCN reaction occurring within the last 10 years: No If all of the above answers are "NO", then may proceed with Cephalosporin use.      Outpatient Medications Prior to Visit  Medication Sig Dispense Refill   acetaminophen (TYLENOL) 650 MG CR tablet Take 1,300 mg by mouth 2 (two) times daily.     albuterol (PROVENTIL) (5 MG/ML) 0.5% nebulizer solution Take 0.5 mLs (2.5 mg total) by nebulization every 6 (six) hours as needed for wheezing or shortness of breath. 20 mL 2   albuterol (VENTOLIN HFA) 108 (90 Base) MCG/ACT inhaler Inhale 2 puffs into the lungs every 6 (six) hours as needed for wheezing or shortness of breath. 18 g 3   azelastine (ASTELIN) 0.1 % nasal spray Place 2 sprays into both nostrils at bedtime as needed for rhinitis. 30 mL 3   Biotin 5 MG TABS Take 5 mg by mouth daily.      Calcium Carbonate-Vitamin D (CALCIUM + D PO) Take 1 tablet by mouth 2 (two) times a week.      Cholecalciferol (VITAMIN D3) 2000 units TABS Take 2,000 Units by mouth daily.      clopidogrel (PLAVIX) 75 MG tablet TAKE 1 TABLET BY MOUTH ONCE A DAY 90 tablet 0   diltiazem (CARDIZEM CD) 240 MG 24 hr capsule TAKE 1 CAPSULE BY MOUTH ONCE DAILY 90 capsule 2   diltiazem (CARDIZEM) 30 MG tablet Take one tablet by mouth every 4 hours AS NEEDED for A-fib HR > 100 as long as BP > 100. 45 tablet 0   dofetilide (TIKOSYN) 125 MCG capsule TAKE 1 CAPSULE BY MOUTH TWICE DAILY 60 capsule 3   doxycycline (VIBRA-TABS) 100 MG tablet Take 1 tablet (100 mg total) by mouth 2 (two) times daily. 14 tablet 0   ELIQUIS 2.5 MG TABS  tablet TAKE 1 TABLET BY MOUTH TWICE A DAY 60 tablet 5   ezetimibe (ZETIA) 10 MG tablet TAKE 1 TABLET BY MOUTH DAILY 90 tablet 0   ferrous fumarate (HEMOCYTE - 106 MG FE) 325 (106 Fe) MG TABS tablet Take 1 tablet (106 mg of iron total) by mouth daily. 90 tablet 1   FLUoxetine (PROZAC) 40 MG capsule Take 1 capsule (40 mg total) by mouth daily. 30 capsule 3   Fluticasone-Umeclidin-Vilant (TRELEGY ELLIPTA) 100-62.5-25 MCG/INH AEPB INAHLE 1 PUFF INTO THE LUNGS DAILY 60 each 5   folic acid (FOLVITE) 1 MG tablet Take 1 tablet (1 mg total) by mouth daily. 90 tablet 1   furosemide (LASIX) 20 MG tablet Take 1 tablet (20 mg total) by mouth as directed. Take 1 tablet (20 mg) daily and extra 1 tablet (20 mg) at 2 PM for shortness of breath or abdominal swelling 180 tablet 2   HYDROcodone-acetaminophen (NORCO) 10-325 MG tablet Take 1 tablet by mouth every 6 (six) hours as needed for severe pain. 30 tablet 0   ipratropium (ATROVENT) 0.02 % nebulizer solution Take 2.5 mLs (0.5 mg total) by nebulization 4 (four) times daily. (Patient taking differently: Take 0.5 mg by nebulization every 4 (four) hours as needed.) 75 mL 2   isosorbide mononitrate (IMDUR) 60 MG 24 hr tablet TAKE 1 TABLET BY MOUTH TWICE A DAY 180 tablet 2   Krill Oil 500 MG  CAPS Take 500 mg by mouth daily.      levothyroxine (SYNTHROID) 75 MCG tablet Take 1 tablet (75 mcg total) by mouth daily. 30 tablet 3   nitroGLYCERIN (NITROSTAT) 0.4 MG SL tablet Place 1 tablet (0.4 mg total) under the tongue every 5 (five) minutes as needed for chest pain. 25 tablet 3   omeprazole (PRILOSEC) 20 MG capsule TAKE 1 CAPSULE BY MOUTH ONCE DAILY 90 capsule 3   ondansetron (ZOFRAN) 4 MG tablet Take 1 tablet (4 mg total) by mouth every 8 (eight) hours as needed for nausea or vomiting. 20 tablet 0   Polyethyl Glycol-Propyl Glycol (SYSTANE OP) Place 1 drop into both eyes 2 (two) times daily as needed (dry/irritated eyes.).      potassium chloride (KLOR-CON) 10 MEQ tablet  Take 1 tablet (10 mEq total) by mouth daily. And take 2 tablets (74meq) on days you take furosemide 135 tablet 2   predniSONE (DELTASONE) 20 MG tablet Take 1 tablet (20 mg total) by mouth daily with breakfast. 5 tablet 0   rosuvastatin (CRESTOR) 40 MG tablet TAKE 1 TABLET BY MOUTH ONCE DAILY 90 tablet 0   zinc gluconate 50 MG tablet Take 50 mg by mouth daily.     albuterol (PROVENTIL) (2.5 MG/3ML) 0.083% nebulizer solution Take 3 mLs (2.5 mg total) by nebulization every 4 (four) hours as needed for wheezing or shortness of breath. Dx:J44.9 (Patient not taking: Reported on 04/02/2021) 75 mL 12   albuterol (VENTOLIN HFA) 108 (90 Base) MCG/ACT inhaler Inhale into the lungs every 6 (six) hours as needed for wheezing or shortness of breath. (Patient not taking: Reported on 04/02/2021)     No facility-administered medications prior to visit.         Objective:   Physical Exam  Vitals:   04/02/21 1016  BP: 120/70  Pulse: 79  Temp: 98.1 F (36.7 C)  TempSrc: Oral  SpO2: 96%  Weight: 124 lb (56.2 kg)  Height: 5\' 2"  (1.575 m)   Gen: Pleasant, overweight elderly woman, in no distress,  normal affect  ENT: No lesions,  mouth clear,  oropharynx clear, no postnasal drip  Neck: No JVD, no stridor  Lungs: No use of accessory muscles, no crackles.  No wheeze on a normal expiration.  She does have end expiratory wheeze on a forced expiration  Cardiovascular: Irregularly irregular, trace pretibial edema left greater than right  Musculoskeletal: No deformities, no cyanosis or clubbing  Neuro: alert, awake, non focal  Skin: Warm, no lesions or rash      Assessment & Plan:  Lung mass Large LUL mass. Discussed options for biopsy - both FOB and TTNA are options. My concern about TTNA is possibility that some of the mass is necrotic and would be non-diagnostic. That said there is widespread hypermetabolism on PET. I believe trying TTNA first would make the most sense since this would avoid  general anesthesia. If the TTNA is non-diagnostic then will proceed with bronchoscopy.    We reviewed your CT scan and PET scan today.  We discussed the different strategies for getting a biopsy of your left lung mass. I think the first strategy should be a transthoracic needle biopsy since this would allow [place] to avoid general anesthesia.  We will try to arrange.  We would need to stop Plavix 5 days prior to biopsy. You would need to stop Eliquis 2 days prior. If your needle biopsy is unrevealing, does not give [place] enough information, then would arrange for bronchoscopy  under general anesthesia.  We will go ahead and tentatively schedule this for 04/15/2021.  This will be done under general anesthesia as an outpatient at [facility] endoscopy.  You would need designated driver.  You need to stop your Plavix 5 days prior to either biopsy. Also stop Eliquis 2 days prior.  Follow with Dr Lamonte Sakai in 1 month  Baltazar Apo, MD, PhD 04/02/2021, 7:14 PM [facility] [phone] or if no answer [phone]

## 2021-04-02 NOTE — Telephone Encounter (Signed)
Pt called regarding med refill. Pulmonary dtr. [place] advised pt to contact pcp to see if they're willing to refill meds.     Medication: HYDROcodone-acetaminophen (NORCO) 10-325 MG tablet      Has the patient contacted their pharmacy? No. (If no, request that the patient contact the pharmacy for the refill.) (If yes, when and what did the pharmacy advise?)     Preferred Pharmacy (with phone number or street name):  [place], [place] - [address] [place] [address]  Phone:  [phone]  Fax:  [phone]     Agent: Please be advised that RX refills may take up to 3 business days. We ask that you follow-up with your pharmacy.

## 2021-04-02 NOTE — Telephone Encounter (Signed)
Requesting: Norco 10-325 mg Contract: UDS: Last Visit: 01/10/2021 Next Visit: 07/11/2021 Last Refill: 01/10/2021, #30 x 0RF  Please Advise

## 2021-04-02 NOTE — Assessment & Plan Note (Signed)
Large LUL mass. Discussed options for biopsy - both FOB and TTNA are options. My concern about TTNA is possibility that some of the mass is necrotic and would be non-diagnostic. That said there is widespread hypermetabolism on PET. I believe trying TTNA first would make the most sense since this would avoid general anesthesia. If the TTNA is non-diagnostic then will proceed with bronchoscopy.    We reviewed your CT scan and PET scan today.  We discussed the different strategies for getting a biopsy of your left lung mass. I think the first strategy should be a transthoracic needle biopsy since this would allow [place] to avoid general anesthesia.  We will try to arrange.  We would need to stop Plavix 5 days prior to biopsy. You would need to stop Eliquis 2 days prior. If your needle biopsy is unrevealing, does not give [place] enough information, then would arrange for bronchoscopy under general anesthesia.  We will go ahead and tentatively schedule this for 04/15/2021.  This will be done under general anesthesia as an outpatient at [facility] endoscopy.  You would need designated driver.  You need to stop your Plavix 5 days prior to either biopsy. Also stop Eliquis 2 days prior.  Follow with Dr Lamonte Sakai in 1 month

## 2021-04-02 NOTE — Patient Instructions (Addendum)
We reviewed your CT scan and PET scan today.  We discussed the different strategies for getting a biopsy of your left lung mass. I think the first strategy should be a transthoracic needle biopsy since this would allow [place] to avoid general anesthesia.  We will try to arrange.  We would need to stop Plavix 5 days prior to biopsy. You would need to stop Eliquis 2 days prior. If your needle biopsy is unrevealing, does not give [place] enough information, then would arrange for bronchoscopy under general anesthesia.  We will go ahead and tentatively schedule this for 04/15/2021.  This will be done under general anesthesia as an outpatient at [facility] endoscopy.  You would need designated driver.  You need to stop your Plavix 5 days prior to either biopsy. Also stop Eliquis 2 days prior.  Follow with Dr Lamonte Sakai in 1 month

## 2021-04-04 ENCOUNTER — Encounter (HOSPITAL_COMMUNITY): Payer: Self-pay | Admitting: Radiology

## 2021-04-04 NOTE — Progress Notes (Signed)
Patient Name  Andrea [name], [name] Legal Sex  Female DOB  [birth year] SSN  [id number] Address  [place] [address] Phone  [phone] (Home)    RE: CT Biopsy Request / Blood thinners Received: Today Camnitz, Ocie Doyne, MD  Audrie Lia, Kathlene November, MD From what I can tell, it would be okay to hold Plavix and Eliquis for 5 days prior to her biopsy.  If Dr. Lazaro Arms not feel differently, just let us know.        Previous Messages   ----- Message -----  From: Garth Bigness D  Sent: 04/02/2021   5:30 PM EST  To: Will Meredith Leeds, MD, Minna Merritts, MD  Subject: CT Biopsy Request / Blood thinners             Good evening Dr. Rockey Situ and Dr. Curt Bears. Mrs Bahena is on Plavix and Eliquis and will need to hold both prior to having her Biopsy. Please advise if okay to hold Plavix for 5 days prior and Eliquis for 2 days prior. Thanks Aniceto Boss

## 2021-04-10 ENCOUNTER — Other Ambulatory Visit: Payer: Self-pay

## 2021-04-10 ENCOUNTER — Encounter (HOSPITAL_COMMUNITY): Payer: Self-pay

## 2021-04-10 ENCOUNTER — Emergency Department (HOSPITAL_COMMUNITY): Payer: Medicare HMO

## 2021-04-10 ENCOUNTER — Inpatient Hospital Stay (HOSPITAL_COMMUNITY)
Admission: EM | Admit: 2021-04-10 | Discharge: 2021-04-25 | DRG: 175 | Disposition: A | Discharge status: Hospice | Payer: Medicare HMO | Attending: Internal Medicine | Admitting: Internal Medicine

## 2021-04-10 DIAGNOSIS — Z800 Family history of malignant neoplasm of digestive organs: Secondary | ICD-10-CM

## 2021-04-10 DIAGNOSIS — Z86711 Personal history of pulmonary embolism: Secondary | ICD-10-CM

## 2021-04-10 DIAGNOSIS — G4733 Obstructive sleep apnea (adult) (pediatric): Secondary | ICD-10-CM | POA: Diagnosis present

## 2021-04-10 DIAGNOSIS — D630 Anemia in neoplastic disease: Secondary | ICD-10-CM | POA: Diagnosis present

## 2021-04-10 DIAGNOSIS — K219 Gastro-esophageal reflux disease without esophagitis: Secondary | ICD-10-CM | POA: Diagnosis present

## 2021-04-10 DIAGNOSIS — R059 Cough, unspecified: Secondary | ICD-10-CM | POA: Diagnosis not present

## 2021-04-10 DIAGNOSIS — Z823 Family history of stroke: Secondary | ICD-10-CM

## 2021-04-10 DIAGNOSIS — R0689 Other abnormalities of breathing: Secondary | ICD-10-CM | POA: Diagnosis not present

## 2021-04-10 DIAGNOSIS — R918 Other nonspecific abnormal finding of lung field: Secondary | ICD-10-CM | POA: Diagnosis present

## 2021-04-10 DIAGNOSIS — Z87891 Personal history of nicotine dependence: Secondary | ICD-10-CM

## 2021-04-10 DIAGNOSIS — R091 Pleurisy: Secondary | ICD-10-CM | POA: Diagnosis not present

## 2021-04-10 DIAGNOSIS — Z79899 Other long term (current) drug therapy: Secondary | ICD-10-CM

## 2021-04-10 DIAGNOSIS — I2510 Atherosclerotic heart disease of native coronary artery without angina pectoris: Secondary | ICD-10-CM | POA: Diagnosis present

## 2021-04-10 DIAGNOSIS — R0902 Hypoxemia: Secondary | ICD-10-CM | POA: Diagnosis not present

## 2021-04-10 DIAGNOSIS — C7951 Secondary malignant neoplasm of bone: Secondary | ICD-10-CM | POA: Diagnosis present

## 2021-04-10 DIAGNOSIS — E440 Moderate protein-calorie malnutrition: Secondary | ICD-10-CM | POA: Diagnosis not present

## 2021-04-10 DIAGNOSIS — C3412 Malignant neoplasm of upper lobe, left bronchus or lung: Secondary | ICD-10-CM | POA: Diagnosis present

## 2021-04-10 DIAGNOSIS — B370 Candidal stomatitis: Secondary | ICD-10-CM | POA: Diagnosis present

## 2021-04-10 DIAGNOSIS — J9601 Acute respiratory failure with hypoxia: Secondary | ICD-10-CM | POA: Diagnosis not present

## 2021-04-10 DIAGNOSIS — R079 Chest pain, unspecified: Secondary | ICD-10-CM | POA: Diagnosis not present

## 2021-04-10 DIAGNOSIS — J9611 Chronic respiratory failure with hypoxia: Secondary | ICD-10-CM | POA: Diagnosis present

## 2021-04-10 DIAGNOSIS — Z66 Do not resuscitate: Secondary | ICD-10-CM | POA: Diagnosis present

## 2021-04-10 DIAGNOSIS — M8458XA Pathological fracture in neoplastic disease, other specified site, initial encounter for fracture: Secondary | ICD-10-CM | POA: Diagnosis present

## 2021-04-10 DIAGNOSIS — I5032 Chronic diastolic (congestive) heart failure: Secondary | ICD-10-CM | POA: Diagnosis not present

## 2021-04-10 DIAGNOSIS — Z7189 Other specified counseling: Secondary | ICD-10-CM | POA: Diagnosis not present

## 2021-04-10 DIAGNOSIS — J449 Chronic obstructive pulmonary disease, unspecified: Secondary | ICD-10-CM | POA: Diagnosis not present

## 2021-04-10 DIAGNOSIS — Z7901 Long term (current) use of anticoagulants: Secondary | ICD-10-CM

## 2021-04-10 DIAGNOSIS — J189 Pneumonia, unspecified organism: Secondary | ICD-10-CM | POA: Diagnosis present

## 2021-04-10 DIAGNOSIS — Z9841 Cataract extraction status, right eye: Secondary | ICD-10-CM

## 2021-04-10 DIAGNOSIS — R0602 Shortness of breath: Secondary | ICD-10-CM

## 2021-04-10 DIAGNOSIS — Z6823 Body mass index (BMI) 23.0-23.9, adult: Secondary | ICD-10-CM

## 2021-04-10 DIAGNOSIS — I10 Essential (primary) hypertension: Secondary | ICD-10-CM | POA: Diagnosis not present

## 2021-04-10 DIAGNOSIS — I739 Peripheral vascular disease, unspecified: Secondary | ICD-10-CM | POA: Diagnosis present

## 2021-04-10 DIAGNOSIS — F419 Anxiety disorder, unspecified: Secondary | ICD-10-CM | POA: Diagnosis present

## 2021-04-10 DIAGNOSIS — E871 Hypo-osmolality and hyponatremia: Secondary | ICD-10-CM | POA: Diagnosis not present

## 2021-04-10 DIAGNOSIS — E039 Hypothyroidism, unspecified: Secondary | ICD-10-CM | POA: Diagnosis present

## 2021-04-10 DIAGNOSIS — I482 Chronic atrial fibrillation, unspecified: Secondary | ICD-10-CM | POA: Diagnosis not present

## 2021-04-10 DIAGNOSIS — M8448XA Pathological fracture, other site, initial encounter for fracture: Secondary | ICD-10-CM | POA: Diagnosis not present

## 2021-04-10 DIAGNOSIS — J439 Emphysema, unspecified: Secondary | ICD-10-CM | POA: Diagnosis present

## 2021-04-10 DIAGNOSIS — I110 Hypertensive heart disease with heart failure: Secondary | ICD-10-CM | POA: Diagnosis present

## 2021-04-10 DIAGNOSIS — K5900 Constipation, unspecified: Secondary | ICD-10-CM | POA: Diagnosis not present

## 2021-04-10 DIAGNOSIS — E785 Hyperlipidemia, unspecified: Secondary | ICD-10-CM | POA: Diagnosis present

## 2021-04-10 DIAGNOSIS — R64 Cachexia: Secondary | ICD-10-CM | POA: Diagnosis present

## 2021-04-10 DIAGNOSIS — I2699 Other pulmonary embolism without acute cor pulmonale: Principal | ICD-10-CM | POA: Diagnosis present

## 2021-04-10 DIAGNOSIS — F32A Depression, unspecified: Secondary | ICD-10-CM | POA: Diagnosis present

## 2021-04-10 DIAGNOSIS — J9621 Acute and chronic respiratory failure with hypoxia: Secondary | ICD-10-CM | POA: Diagnosis present

## 2021-04-10 DIAGNOSIS — Z9842 Cataract extraction status, left eye: Secondary | ICD-10-CM

## 2021-04-10 DIAGNOSIS — J90 Pleural effusion, not elsewhere classified: Secondary | ICD-10-CM

## 2021-04-10 DIAGNOSIS — R52 Pain, unspecified: Secondary | ICD-10-CM | POA: Diagnosis present

## 2021-04-10 DIAGNOSIS — Z801 Family history of malignant neoplasm of trachea, bronchus and lung: Secondary | ICD-10-CM

## 2021-04-10 DIAGNOSIS — R0600 Dyspnea, unspecified: Secondary | ICD-10-CM

## 2021-04-10 DIAGNOSIS — R042 Hemoptysis: Secondary | ICD-10-CM | POA: Diagnosis present

## 2021-04-10 DIAGNOSIS — I517 Cardiomegaly: Secondary | ICD-10-CM | POA: Diagnosis not present

## 2021-04-10 DIAGNOSIS — M810 Age-related osteoporosis without current pathological fracture: Secondary | ICD-10-CM | POA: Diagnosis present

## 2021-04-10 DIAGNOSIS — I700 Atherosclerosis of aorta: Secondary | ICD-10-CM | POA: Diagnosis not present

## 2021-04-10 DIAGNOSIS — Z951 Presence of aortocoronary bypass graft: Secondary | ICD-10-CM

## 2021-04-10 DIAGNOSIS — Z9889 Other specified postprocedural states: Secondary | ICD-10-CM

## 2021-04-10 DIAGNOSIS — Z955 Presence of coronary angioplasty implant and graft: Secondary | ICD-10-CM

## 2021-04-10 DIAGNOSIS — K449 Diaphragmatic hernia without obstruction or gangrene: Secondary | ICD-10-CM | POA: Diagnosis not present

## 2021-04-10 DIAGNOSIS — E876 Hypokalemia: Secondary | ICD-10-CM | POA: Diagnosis not present

## 2021-04-10 DIAGNOSIS — I959 Hypotension, unspecified: Secondary | ICD-10-CM | POA: Diagnosis not present

## 2021-04-10 DIAGNOSIS — D509 Iron deficiency anemia, unspecified: Secondary | ICD-10-CM | POA: Diagnosis not present

## 2021-04-10 DIAGNOSIS — J9690 Respiratory failure, unspecified, unspecified whether with hypoxia or hypercapnia: Secondary | ICD-10-CM | POA: Diagnosis not present

## 2021-04-10 DIAGNOSIS — Z85828 Personal history of other malignant neoplasm of skin: Secondary | ICD-10-CM

## 2021-04-10 DIAGNOSIS — Z7902 Long term (current) use of antithrombotics/antiplatelets: Secondary | ICD-10-CM

## 2021-04-10 DIAGNOSIS — I5033 Acute on chronic diastolic (congestive) heart failure: Secondary | ICD-10-CM | POA: Insufficient documentation

## 2021-04-10 DIAGNOSIS — Z8249 Family history of ischemic heart disease and other diseases of the circulatory system: Secondary | ICD-10-CM

## 2021-04-10 DIAGNOSIS — Z7951 Long term (current) use of inhaled steroids: Secondary | ICD-10-CM

## 2021-04-10 DIAGNOSIS — Z515 Encounter for palliative care: Secondary | ICD-10-CM

## 2021-04-10 DIAGNOSIS — R54 Age-related physical debility: Secondary | ICD-10-CM | POA: Diagnosis present

## 2021-04-10 DIAGNOSIS — Z811 Family history of alcohol abuse and dependence: Secondary | ICD-10-CM

## 2021-04-10 DIAGNOSIS — Z8679 Personal history of other diseases of the circulatory system: Secondary | ICD-10-CM

## 2021-04-10 DIAGNOSIS — I480 Paroxysmal atrial fibrillation: Secondary | ICD-10-CM | POA: Diagnosis present

## 2021-04-10 DIAGNOSIS — Z79890 Hormone replacement therapy (postmenopausal): Secondary | ICD-10-CM

## 2021-04-10 DIAGNOSIS — Z961 Presence of intraocular lens: Secondary | ICD-10-CM | POA: Diagnosis present

## 2021-04-10 DIAGNOSIS — Z20822 Contact with and (suspected) exposure to covid-19: Secondary | ICD-10-CM | POA: Diagnosis present

## 2021-04-10 DIAGNOSIS — C3492 Malignant neoplasm of unspecified part of left bronchus or lung: Secondary | ICD-10-CM | POA: Diagnosis not present

## 2021-04-10 DIAGNOSIS — Z7401 Bed confinement status: Secondary | ICD-10-CM

## 2021-04-10 DIAGNOSIS — I25708 Atherosclerosis of coronary artery bypass graft(s), unspecified, with other forms of angina pectoris: Secondary | ICD-10-CM | POA: Diagnosis not present

## 2021-04-10 DIAGNOSIS — I252 Old myocardial infarction: Secondary | ICD-10-CM

## 2021-04-10 DIAGNOSIS — Z833 Family history of diabetes mellitus: Secondary | ICD-10-CM

## 2021-04-10 DIAGNOSIS — I2609 Other pulmonary embolism with acute cor pulmonale: Secondary | ICD-10-CM | POA: Diagnosis not present

## 2021-04-10 DIAGNOSIS — R0609 Other forms of dyspnea: Secondary | ICD-10-CM | POA: Diagnosis not present

## 2021-04-10 LAB — CBC WITH DIFFERENTIAL/PLATELET
Abs Immature Granulocytes: 0.06 10*3/uL (ref 0.00–0.07)
Basophils Absolute: 0 10*3/uL (ref 0.0–0.1)
Basophils Relative: 0 %
Eosinophils Absolute: 0.2 10*3/uL (ref 0.0–0.5)
Eosinophils Relative: 1 %
HCT: 35.2 % — ABNORMAL LOW (ref 36.0–46.0)
Hemoglobin: 10.5 g/dL — ABNORMAL LOW (ref 12.0–15.0)
Immature Granulocytes: 1 %
Lymphocytes Relative: 7 %
Lymphs Abs: 0.9 10*3/uL (ref 0.7–4.0)
MCH: 22.8 pg — ABNORMAL LOW (ref 26.0–34.0)
MCHC: 29.8 g/dL — ABNORMAL LOW (ref 30.0–36.0)
MCV: 76.4 fL — ABNORMAL LOW (ref 80.0–100.0)
Monocytes Absolute: 0.7 10*3/uL (ref 0.1–1.0)
Monocytes Relative: 5 %
Neutro Abs: 11 10*3/uL — ABNORMAL HIGH (ref 1.7–7.7)
Neutrophils Relative %: 86 %
Platelets: 331 10*3/uL (ref 150–400)
RBC: 4.61 MIL/uL (ref 3.87–5.11)
RDW: 18.6 % — ABNORMAL HIGH (ref 11.5–15.5)
WBC: 12.9 10*3/uL — ABNORMAL HIGH (ref 4.0–10.5)
nRBC: 0 % (ref 0.0–0.2)

## 2021-04-10 LAB — BRAIN NATRIURETIC PEPTIDE: B Natriuretic Peptide: 305.1 pg/mL — ABNORMAL HIGH (ref 0.0–100.0)

## 2021-04-10 LAB — BASIC METABOLIC PANEL
Anion gap: 9 (ref 5–15)
BUN: 10 mg/dL (ref 8–23)
CO2: 29 mmol/L (ref 22–32)
Calcium: 8.5 mg/dL — ABNORMAL LOW (ref 8.9–10.3)
Chloride: 97 mmol/L — ABNORMAL LOW (ref 98–111)
Creatinine, Ser: 0.84 mg/dL (ref 0.44–1.00)
GFR, Estimated: >60 mL/min (ref >60)
Glucose, Bld: 113 mg/dL — ABNORMAL HIGH (ref 70–99)
Potassium: 3.8 mmol/L (ref 3.5–5.1)
Sodium: 135 mmol/L (ref 135–145)

## 2021-04-10 LAB — TROPONIN I (HIGH SENSITIVITY): Troponin I (High Sensitivity): 10 ng/L (ref <18)

## 2021-04-10 MED ORDER — HYDROMORPHONE HCL 1 MG/ML IJ SOLN
0.5000 mg | Freq: Once | INTRAMUSCULAR | Status: AC
  Administered 2021-04-10: 0.5 mg via INTRAVENOUS
  Filled 2021-04-10: qty 1

## 2021-04-10 MED ORDER — HYDROMORPHONE HCL 1 MG/ML IJ SOLN
0.5000 mg | INTRAMUSCULAR | Status: AC | PRN
  Administered 2021-04-11 (×2): 0.5 mg via INTRAVENOUS
  Filled 2021-04-10 (×2): qty 1

## 2021-04-10 MED ORDER — ACETAMINOPHEN 500 MG PO TABS
1000.0000 mg | ORAL_TABLET | Freq: Once | ORAL | Status: AC
  Administered 2021-04-10: 1000 mg via ORAL
  Filled 2021-04-10: qty 2

## 2021-04-10 NOTE — ED Triage Notes (Addendum)
Pt BIB [facility] from home c/o [facility] & pain in R lower lobe after coughing up blood 2 hr ago. Pt on 2L [place] d/t COPD. Per EMS, pt sats 89% on 2L. Pt placed on NRB & given 148mcg Fentanyl and pain improved & sats 99-100%. A&Ox4 HX: Lung cancer, Afib

## 2021-04-10 NOTE — ED Provider Notes (Signed)
[facility] EMERGENCY DEPARTMENT Provider Note   CSN: [id number] Arrival date & time: 04/10/21  2109     History No chief complaint on file.   Margarett Viti is a 80 y.o. female.  80 yo F with a chief complaints of chest pain.  The patient been having some right sided rib pain off and on for few weeks with hemoptysis.  She is being followed by pulmonology for a lung mass that was noted on CT and PET scan.  She is scheduled for a biopsy upcoming but this evening she was coughing and felt sudden severe pain on the right side of her chest in the area that she had been feeling some discomfort.  She tells me that on the PET scan she had some bone involvement there and she is worried that she broke that rib.  Having pain worse with twisting turning deep breathing.  She is on blood thinners chronically but stopped yesterday for the biopsy.  The history is provided by the patient.  Illness Severity:  Moderate Onset quality:  Sudden Duration:  2 hours Timing:  Constant Progression:  Unchanged Chronicity:  New Associated symptoms: chest pain and shortness of breath   Associated symptoms: no congestion, no fever, no headaches, no myalgias, no nausea, no rhinorrhea, no vomiting and no wheezing       Past Medical History:  Diagnosis Date   AAA (abdominal aortic aneurysm) 01/2009   AAA (2.8 x 3.0)  moderate RAS (left); 2.7 x 2.7 cm (07/11/05)   Acute bronchitis 03/20/2013; 2017   Angina    Anosmia    Asthma    Baker's cyst of knee 01/30/2014   Basal cell carcinoma    "back and left arm"   Bradycardia    Metoprolol stopped 08/2011   CAD (coronary artery disease)    s/CABG (reports IMA and 2 SVGs) back in 1999  Myoview normal 3/10; s/p PCI with DES to PL branch of distal RCA 09/2011; PCI +DES to SVG-RCA, PCI + DES to mid LCx 12/2015   Cerebrovascular disease 01/2009   carotid u/s  R 0-39%   L 60-79%   Chronic thoracic back pain    COPD (chronic obstructive pulmonary disease)  (HCC)    Depression    Dizziness    Dysrhythmia    hx of sinus brady   GERD (gastroesophageal reflux disease)    Heart murmur    Herniated lumbar disc without myelopathy    History of blood transfusion 1999   "when I had the bypass; had a PE"   Hyperglycemia 10/23/2015   Hyperlipidemia    Hypertension    Hypothyroidism 09/30/2016   Medicare annual wellness visit, subsequent 07/31/2013   NSTEMI (non-ST elevated myocardial infarction) ([place]) 11/12   Cath showed atretic IMA graft to the LAD, SVG to PD was patent but the continuation of this graft to the PL branch was occluded; there were L to R collaterals; Mid LAD had a 60 to 70% stenosis. She has been treated medically.  Neg Myoview 05/2011   Osteoarthritis of back    Osteoporosis    Pneumonia "several times"   Pulmonary embolism ([place]) 1999   "after my bypass"   PVD (peripheral vascular disease) ([place])    Shortness of breath    Squamous carcinoma    "nose"   Thyroid disease    Hypothyroid   Unstable angina ([place]) 09/25/2015    Patient Active Problem List   Diagnosis Date Noted   Pulmonary embolism (  [place]) 04/11/2021   Pathological fracture of one rib 04/11/2021   Microcytic anemia 04/11/2021   Lung mass 03/05/2021   Secondary hypercoagulable state ([place]) 10/07/2019   Cor pulmonale, chronic (HCC) 08/17/2019   Chronic diastolic CHF (congestive heart failure) ([place]) 08/17/2019   COPD (chronic obstructive pulmonary disease) ([place]) 08/14/2019   Chronic respiratory failure with hypoxia ([place]) 08/12/2019   Atrial fibrillation, chronic ([place]) 08/12/2019   OSA (obstructive sleep apnea) 08/12/2019   Vitamin D deficiency 06/13/2019   Witnessed episode of apnea 03/07/2018   Cervical cancer screening 12/09/2017   Preventative health care 12/09/2017   Chronic ethmoidal sinusitis 09/04/2017   Hypothyroidism 09/30/2016   Presbycusis of both ears 08/05/2016   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    Cramps, extremity 07/03/2016   Mitral valve disease 07/03/2016   Flank pain 07/03/2016   Stable angina ([place]) 06/17/2016   Hx of CABG 12/27/2015   Hyperglycemia 10/23/2015   Unstable angina (HCC) 09/25/2015   Upper airway cough syndrome 03/23/2015   Cystitis 04/11/2014   Baker's cyst of knee 01/30/2014   Thoracic back pain 01/24/2014   Breast cancer screening 07/31/2013   Dyspnea 05/09/2013   Anosmia    Fatigue 09/17/2011   CAD (coronary artery disease) 06/06/2011   NSTEMI (non-ST elevated myocardial infarction) ([place]) 04/22/2011   Excessive somnolence disorder 12/03/2010   BACK PAIN, LUMBAR 07/16/2010   SINUSITIS, CHRONIC 05/07/2010   Cough 09/10/2009   SKIN CANCER, HX OF 04/03/2009   Coronary artery disease involving coronary bypass graft of native heart with angina pectoris ([place]) 01/24/2009   Carotid artery stenosis 01/24/2009   Atherosclerosis of renal artery ([place]) 01/24/2009   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   AORTIC ANEUR UNSPEC SITE WITHOUT MENTION RUPTURE 01/24/2009   Hyperlipidemia 12/22/2006   Depression, recurrent ([place]) 12/22/2006   HTN (hypertension) 12/22/2006   PERIPHERAL VASCULAR DISEASE 12/22/2006   GERD 12/22/2006   Osteoporosis 12/22/2006    Past Surgical History:  Procedure Laterality Date   ABDOMINAL AORTIC ANEURYSM REPAIR     pt denies this hx on 01/02/2016   BASAL CELL CARCINOMA EXCISION     CARDIAC CATHETERIZATION N/A 01/02/2016   Procedure: Left Heart Cath and Coronary Angiography;  Surgeon: Wellington Hampshire, MD;  Location: [facility];  Service: Cardiovascular;  Laterality: N/A;   CARDIAC CATHETERIZATION  1996; 1999   CARDIAC CATHETERIZATION N/A 06/25/2016   Procedure: Left Heart Cath and Cors/Grafts Angiography;  Surgeon: Wellington Hampshire, MD;  Location: [facility];  Service: Cardiovascular;  Laterality: N/A;   CARDIOVERSION N/A 10/19/2019   Procedure: CARDIOVERSION;  Surgeon: Jerline Pain, MD;  Location: [facility];  Service:  Cardiovascular;  Laterality: N/A;   CARDIOVERSION N/A 04/25/2020   Procedure: CARDIOVERSION;  Surgeon: Jerline Pain, MD;  Location: [facility] ENDOSCOPY;  Service: Cardiovascular;  Laterality: N/A;   CAROTID ENDARTERECTOMY Left 01/02/2011   CATARACT EXTRACTION W/ INTRAOCULAR LENS  IMPLANT, BILATERAL     CLOSED REDUCTION NASAL FRACTURE  11/2007   CORONARY ANGIOPLASTY WITH STENT PLACEMENT  10/10/2011   drug eluting  to rc & saphenous   CORONARY ARTERY BYPASS GRAFT  1999   "CABG X3"   DILATION AND CURETTAGE OF UTERUS     FEMORAL ARTERY STENT Bilateral    FRACTURE SURGERY     LEFT HEART CATHETERIZATION WITH CORONARY/GRAFT ANGIOGRAM N/A 04/22/2011   Procedure: LEFT HEART CATHETERIZATION WITH Beatrix Fetters;  Surgeon: Jolaine Artist, MD;  Location: [facility] C[facility] LAB;  Service: Cardiovascular;  Laterality: N/A;   LEFT HEART CATHETERIZATION WITH CORONARY/GRAFT ANGIOGRAM N/A 10/10/2011   Procedure: LEFT HEART CATHETERIZATION WITH Beatrix Fetters;  Surgeon: Peter M Martinique, MD;  Location: [facility] CATH LAB;  Service: Cardiovascular;  Laterality: N/A;   NASAL SINUS SURGERY     twice   SQUAMOUS CELL CARCINOMA EXCISION     SVT ABLATION N/A 03/19/2018   Procedure: SVT ABLATION;  Surgeon: Constance Haw, MD;  Location: [facility];  Service: Cardiovascular;  Laterality: N/A;   TUBAL LIGATION       OB History     Gravida  6   Para  5   Term  3   Preterm  2   AB  1   Living  5      SAB  1   IAB      Ectopic      Multiple      Live Births  65           Family History  Problem Relation Age of Onset   Heart attack Mother 61   Diabetes Mother    Colon cancer Father 58   Lung cancer Father        smoked   Heart disease Father    Hypertension Father    Angina Father    Lung cancer Sister        smoked   Hypothyroidism Sister    Hypertension Brother    Heart attack Brother    Heart disease Brother        PTCA with Stent   Heart attack Brother        CABG    Heart disease Brother        CABG with 1 bypass   Aneurysm Brother        brain   Alcohol abuse Brother    Hyperlipidemia Daughter    Diabetes Maternal Grandmother    Stroke Maternal Grandmother    Cirrhosis Maternal Grandfather    Stroke Paternal Grandmother    Obesity Daughter    Obesity Son     Social History   Tobacco Use   Smoking status: Former    Packs/day: 0.50    Years: 40.00    Pack years: 20.00    Types: Cigarettes    Start date: 4    Quit date: 01/19/1993    Years since quitting: 28.2   Smokeless tobacco: Never  Vaping Use   Vaping Use: Never used  Substance Use Topics   Alcohol use: Yes    Comment: 01/02/2016 "might drink a glass of wine socially q 3-4 months"   Drug use: No    Home Medications Prior to Admission medications   Medication Sig Start Date End Date Taking? Authorizing Provider  acetaminophen (TYLENOL) 650 MG CR tablet Take 1,300 mg by mouth 2 (two) times daily.   Yes [provider]  albuterol (PROVENTIL) (2.5 MG/3ML) 0.083% nebulizer solution Take 3 mLs (2.5 mg total) by nebulization every 4 (four) hours as needed for wheezing or shortness of breath. Dx:J44.9 03/25/19  Yes Kasa, Maretta Bees, MD  albuterol (PROVENTIL) (5 MG/ML) 0.5% nebulizer solution Take 0.5 mLs (2.5 mg total) by nebulization every 6 (six) hours as needed for wheezing or shortness of breath. 04/10/20  Yes Charlesetta Shanks, MD  albuterol (VENTOLIN HFA) 108 (90 Base) MCG/ACT inhaler Inhale 2 puffs into the lungs every 6 (six) hours as needed for wheezing or shortness of breath. 03/05/21  Yes Parrett, Fonnie Mu,  NP  azelastine (ASTELIN) 0.1 % nasal spray Place 2 sprays into both nostrils at bedtime as needed for rhinitis. 06/28/18  Yes Mosie Lukes, MD  Biotin 5 MG TABS Take 5 mg by mouth daily.    Yes [provider]  Calcium Carbonate-Vitamin D (CALCIUM + D PO) Take 1 tablet by mouth 2 (two) times a week.    Yes [provider]  Cholecalciferol (VITAMIN D3)  2000 units TABS Take 2,000 Units by mouth daily.    Yes [provider]  clopidogrel (PLAVIX) 75 MG tablet TAKE 1 TABLET BY MOUTH ONCE A DAY Patient taking differently: Take 75 mg by mouth daily. 01/09/21  Yes Gollan, Kathlene November, MD  diltiazem (CARDIZEM CD) 240 MG 24 hr capsule TAKE 1 CAPSULE BY MOUTH ONCE DAILY Patient taking differently: 240 mg daily. 09/12/20  Yes Bhagat, Bhavinkumar, PA  diltiazem (CARDIZEM) 30 MG tablet Take one tablet by mouth every 4 hours AS NEEDED for A-fib HR > 100 as long as BP > 100. 11/11/18  Yes Fenton, Clint R, PA  dofetilide (TIKOSYN) 125 MCG capsule TAKE 1 CAPSULE BY MOUTH TWICE DAILY Patient taking differently: Take 125 mcg by mouth 2 (two) times daily. 02/26/21  Yes Baldwin Jamaica, PA-C  ezetimibe (ZETIA) 10 MG tablet TAKE 1 TABLET BY MOUTH DAILY Patient taking differently: Take 10 mg by mouth daily. 01/09/21  Yes Gollan, Kathlene November, MD  ferrous fumarate (HEMOCYTE - 106 MG FE) 325 (106 Fe) MG TABS tablet Take 1 tablet (106 mg of iron total) by mouth daily. 01/15/21  Yes Mosie Lukes, MD  FLUoxetine (PROZAC) 40 MG capsule Take 1 capsule (40 mg total) by mouth daily. 02/27/21  Yes Mosie Lukes, MD  Fluticasone-Umeclidin-Vilant (TRELEGY ELLIPTA) 100-62.5-25 MCG/INH AEPB INAHLE 1 PUFF INTO THE LUNGS DAILY Patient taking differently: Inhale 1 puff into the lungs daily. 02/17/21  Yes Collene Gobble, MD  folic acid (FOLVITE) 1 MG tablet Take 1 tablet (1 mg total) by mouth daily. 01/15/21  Yes Mosie Lukes, MD  furosemide (LASIX) 20 MG tablet Take 1 tablet (20 mg total) by mouth as directed. Take 1 tablet (20 mg) daily and extra 1 tablet (20 mg) at 2 PM for shortness of breath or abdominal swelling 01/27/20  Yes Gollan, Kathlene November, MD  HYDROcodone-acetaminophen (NORCO) 10-325 MG tablet Take 1 tablet by mouth every 6 (six) hours as needed for severe pain. 04/02/21  Yes Mosie Lukes, MD  ipratropium (ATROVENT) 0.02 % nebulizer solution Take 2.5 mLs (0.5 mg total)  by nebulization 4 (four) times daily. Patient taking differently: Take 0.5 mg by nebulization every 4 (four) hours as needed. 04/10/20  Yes Charlesetta Shanks, MD  isosorbide mononitrate (IMDUR) 60 MG 24 hr tablet TAKE 1 TABLET BY MOUTH TWICE A DAY Patient taking differently: 60 mg 2 (two) times daily. 11/09/20  Yes Gollan, Kathlene November, MD  Krill Oil 500 MG CAPS Take 500 mg by mouth daily.    Yes [provider]  levothyroxine (SYNTHROID) 75 MCG tablet Take 1 tablet (75 mcg total) by mouth daily. 01/10/21  Yes Mosie Lukes, MD  nitroGLYCERIN (NITROSTAT) 0.4 MG SL tablet Place 1 tablet (0.4 mg total) under the tongue every 5 (five) minutes as needed for chest pain. 06/22/20  Yes Baldwin Jamaica, PA-C  omeprazole (PRILOSEC) 20 MG capsule TAKE 1 CAPSULE BY MOUTH ONCE DAILY Patient taking differently: 20 mg daily. 01/08/21  Yes Mosie Lukes, MD  ondansetron (ZOFRAN) 4 MG  tablet Take 1 tablet (4 mg total) by mouth every 8 (eight) hours as needed for nausea or vomiting. 03/22/21 03/22/22 Yes Parrett, Tammy S, NP  Polyethyl Glycol-Propyl Glycol (SYSTANE OP) Place 1 drop into both eyes 2 (two) times daily as needed (dry/irritated eyes.).    Yes [provider]  potassium chloride (KLOR-CON) 10 MEQ tablet Take 1 tablet (10 mEq total) by mouth daily. And take 2 tablets (44meq) on days you take furosemide 04/26/20  Yes Baldwin Jamaica, PA-C  rosuvastatin (CRESTOR) 40 MG tablet TAKE 1 TABLET BY MOUTH ONCE DAILY Patient taking differently: Take 40 mg by mouth daily. 01/09/21  Yes Minna Merritts, MD  zinc gluconate 50 MG tablet Take 50 mg by mouth daily.   Yes [provider]  albuterol (VENTOLIN HFA) 108 (90 Base) MCG/ACT inhaler Inhale into the lungs every 6 (six) hours as needed for wheezing or shortness of breath. Patient not taking: Reported on 04/11/2021    [provider]  ELIQUIS 2.5 MG TABS tablet TAKE 1 TABLET BY MOUTH TWICE A DAY Patient taking differently: Take  2.5 mg by mouth 2 (two) times daily. 02/26/21   Camnitz, Ocie Doyne, MD    Allergies    Erythromycin, Tape, Meperidine, Shellfish allergy, Ciprofloxacin, Codeine, Latex, and Penicillins  Review of Systems   Review of Systems  Constitutional:  Negative for chills and fever.  HENT:  Negative for congestion and rhinorrhea.   Eyes:  Negative for redness and visual disturbance.  Respiratory:  Positive for shortness of breath. Negative for wheezing.        Hemoptysis   Cardiovascular:  Positive for chest pain. Negative for palpitations.  Gastrointestinal:  Negative for nausea and vomiting.  Genitourinary:  Negative for dysuria and urgency.  Musculoskeletal:  Negative for arthralgias and myalgias.  Skin:  Negative for pallor and wound.  Neurological:  Negative for dizziness and headaches.   Physical Exam Updated Vital Signs BP 117/64   Pulse 74   Temp 98.7 F (37.1 C) (Oral)   Resp 18   Ht 5\' 2"  (1.575 m)   Wt 56.7 kg   SpO2 97%   BMI 22.86 kg/m   Physical Exam Vitals and nursing note reviewed.  Constitutional:      General: She is not in acute distress.    Appearance: She is well-developed. She is not diaphoretic.  HENT:     Head: Normocephalic and atraumatic.  Eyes:     Pupils: Pupils are equal, round, and reactive to light.  Cardiovascular:     Rate and Rhythm: Normal rate and regular rhythm.     Heart sounds: No murmur heard.   No friction rub. No gallop.  Pulmonary:     Effort: Pulmonary effort is normal.     Breath sounds: No wheezing or rales.     Comments: Pain with palpation of the right anterior chest wall.  Worst about ribs 8 through 12. Chest:     Chest wall: Tenderness present.  Abdominal:     General: There is no distension.     Palpations: Abdomen is soft.     Tenderness: There is no abdominal tenderness.  Musculoskeletal:        General: No tenderness.     Cervical back: Normal range of motion and neck supple.  Skin:    General: Skin is warm and  dry.  Neurological:     Mental Status: She is alert and oriented to person, place, and time.  Psychiatric:  Behavior: Behavior normal.    ED Results / Procedures / Treatments   Labs (all labs ordered are listed, but only abnormal results are displayed) Labs Reviewed  CBC WITH DIFFERENTIAL/PLATELET - Abnormal; Notable for the following components:      Result Value   WBC 12.9 (*)    Hemoglobin 10.5 (*)    HCT 35.2 (*)    MCV 76.4 (*)    MCH 22.8 (*)    MCHC 29.8 (*)    RDW 18.6 (*)    Neutro Abs 11.0 (*)    All other components within normal limits  BASIC METABOLIC PANEL - Abnormal; Notable for the following components:   Chloride 97 (*)    Glucose, Bld 113 (*)    Calcium 8.5 (*)    All other components within normal limits  BRAIN NATRIURETIC PEPTIDE - Abnormal; Notable for the following components:   B Natriuretic Peptide 305.1 (*)    All other components within normal limits  BASIC METABOLIC PANEL - Abnormal; Notable for the following components:   Sodium 132 (*)    Chloride 96 (*)    Glucose, Bld 135 (*)    Calcium 8.1 (*)    All other components within normal limits  RESP PANEL BY RT-PCR (FLU A&B, COVID) ARPGX2  MAGNESIUM  APTT  APTT  HEPARIN LEVEL (UNFRACTIONATED)  HEPARIN LEVEL (UNFRACTIONATED)  TROPONIN I (HIGH SENSITIVITY)    EKG EKG Interpretation  Date/Time:  Wednesday April 10 2021 21:16:50 EST Ventricular Rate:  88 PR Interval:  128 QRS Duration: 108 QT Interval:  378 QTC Calculation: 458 R Axis:   103 Text Interpretation: Unknown rhythm, irregular rate Right axis deviation Repol abnrm suggests ischemia, inferior leads No significant change since last tracing Confirmed by Deno Etienne [id number]) on 04/10/2021 9:30:00 PM  Radiology CT Angio Chest PE W and/or Wo Contrast  Result Date: 04/11/2021 CLINICAL DATA:  Concern for pulmonary embolism.  Left lung mass. EXAM: CT ANGIOGRAPHY CHEST WITH CONTRAST TECHNIQUE: Multidetector CT imaging of the  chest was performed using the standard protocol during bolus administration of intravenous contrast. Multiplanar CT image reconstructions and MIPs were obtained to evaluate the vascular anatomy. CONTRAST:  28ml of Omni 350 COMPARISON:  Chest radiograph dated 04/10/2021 and head CT dated 03/21/2021 FINDINGS: Cardiovascular: There is cardiomegaly. No pericardial effusion. There is coronary vascular calcification and postsurgical changes of CABG. Retrograde flow of contrast from the right atrium into the IVC suggestive of right heart dysfunction. Moderate atherosclerotic calcification of the thoracic aorta. There is linear nearly occlusive thrombus in the left upper lobe pulmonary artery branch. This may represent a bland thrombus versus tumor thrombus. No CT evidence of right heart stranding. Mediastinum/Nodes: Evaluation of the left hilum is limited due to consolidative changes of the left upper lobe. No mediastinal fluid collection. Lungs/Pleura: Large area of consolidation in the left upper lobe as seen on the PET-CT in keeping with known malignancy with associated lymphangitic spread in the left upper lobe. There is background of emphysema. A 1 cm right upper lobe subpleural nodule. No significant pleural effusion. No pneumothorax. The central airways are patent. Upper Abdomen: Small hiatal hernia. Musculoskeletal: A 17 mm lytic lesion involving the posterior right ninth rib with associated pathologic fracture. Median sternotomy wires. Review of the MIP images confirms the above findings. IMPRESSION: 1. Left upper lobe bland thrombus versus tumor thrombus. No CT evidence of right heart stranding. 2. Large area of consolidation in the left upper lobe in keeping with known malignancy with  associated lymphangitic spread in the left upper lobe. 3. Right posterior ninth rib lytic lesion with associated pathologic fracture. 4. Cardiomegaly with evidence of right heart dysfunction. 5. A 1 cm right upper lobe subpleural  nodule. 6. Aortic Atherosclerosis (ICD10-I70.0) and Emphysema (ICD10-J43.9). These results were called by telephone at the time of interpretation on 04/11/2021 at 1:28 am to Dr. Sedonia Small, who verbally acknowledged these results. Electronically Signed   By: Anner Crete M.D.   On: 04/11/2021 01:31   DG Chest Port 1 View  Result Date: 04/10/2021 CLINICAL DATA:  Chest pain and hemoptysis. EXAM: PORTABLE CHEST 1 VIEW COMPARISON:  Chest x-ray 03/05/2021.  CT chest 03/07/2021. FINDINGS: Focal masslike opacity measuring 10 cm in the lateral left upper lobe has increased in size. There is increasing diffuse airspace disease and central interstitial opacities. Right lung is clear. There is no pleural effusion or pneumothorax. Again seen are postsurgical changes in the left chest. Sternotomy wires are present. Cardiomediastinal silhouette is stable, the heart is enlarged. No acute fractures are identified. IMPRESSION: 1. Enlarging left upper lobe mass. 2. New/increasing diffuse left lung airspace disease with interstitial prominence. Findings may related to infection, pneumonia and/or tumor progression. Electronically Signed   By: Ronney Asters M.D.   On: 04/10/2021 21:42   ECHOCARDIOGRAM COMPLETE  Result Date: 04/11/2021    ECHOCARDIOGRAM REPORT   Patient Name:   ONIYA MANDARINO Date of Exam: 04/11/2021 Medical Rec #:  [id number]     Height:       62.0 in Accession #:    [id number]    Weight:       125.0 lb Date of Birth:  22-Apr-1941    BSA:          1.566 m Patient Age:    75 years      BP:           146/37 mmHg Patient Gender: F             HR:           74 bpm. Exam Location:  Inpatient Procedure: 2D Echo, Cardiac Doppler and Color Doppler Indications:    Pulmonary Embolus I26.09  History:        Patient has prior history of Echocardiogram examinations, most                 recent 04/24/2020. CHF, CAD and Previous Myocardial Infarction,                 COPD and PAD, Arrythmias:Atrial Fibrillation,                  Signs/Symptoms:Murmur and Shortness of Breath; Risk                 Factors:Sleep Apnea, Hypertension and Dyslipidemia. Chronic                 hypoxic respiratory failure, hypothyroidism, recently identified                 left lung mass, AAA, bradycardia.  Sonographer:    Darlina Sicilian RDCS Referring Phys: [id number] TIMOTHY S OPYD  Sonographer Comments: Image acquisition challenging due to respiratory motion and Image acquisition challenging due to COPD. IMPRESSIONS  1. Left ventricular ejection fraction, by estimation, is 50 to 55%. The left ventricle has low normal function. The left ventricle demonstrates regional wall motion abnormalities (see scoring diagram/findings for description). Left ventricular diastolic  parameters are indeterminate. There is moderate hypokinesis of the left ventricular,  basal-mid inferior wall and inferoseptal wall.  2. Right ventricular systolic function was not well visualized. The right ventricular size is normal. There is mildly elevated pulmonary artery systolic pressure. The estimated right ventricular systolic pressure is 93.2 mmHg.  3. Left atrial size was moderately dilated.  4. Right atrial size was severely dilated.  5. The mitral valve is degenerative. Mild to moderate mitral valve regurgitation. No evidence of mitral stenosis. Moderate mitral annular calcification.  6. Tricuspid valve regurgitation is moderate to severe.  7. The aortic valve is calcified. There is moderate calcification of the aortic valve. There is moderate thickening of the aortic valve. Aortic valve regurgitation is not visualized. Aortic valve sclerosis/calcification is present, without any evidence of aortic stenosis.  8. Mildly dilated pulmonary artery.  9. The inferior vena cava is normal in size with greater than 50% respiratory variability, suggesting right atrial pressure of 3 mmHg. Comparison(s): Prior images reviewed side by side. Conclusion(s)/Recommendation(s): Difficult images on current  and prior study. Overall appears largely similar, but on current images there is concern for basal hypokinesis of the inferior and inferoseptal wall. Would recommend future studies use echo contrast to evaluate more completely. FINDINGS  Left Ventricle: Left ventricular ejection fraction, by estimation, is 50 to 55%. The left ventricle has low normal function. The left ventricle demonstrates regional wall motion abnormalities. Moderate hypokinesis of the left ventricular, basal-mid inferior wall and inferoseptal wall. 3D left ventricular ejection fraction analysis performed but not reported based on interpreter judgement due to suboptimal tracking. The left ventricular internal cavity size was normal in size. There is no left ventricular hypertrophy. Left ventricular diastolic parameters are indeterminate. Right Ventricle: The right ventricular size is normal. Right vetricular wall thickness was not well visualized. Right ventricular systolic function was not well visualized. There is mildly elevated pulmonary artery systolic pressure. The tricuspid regurgitant velocity is 2.98 m/s, and with an assumed right atrial pressure of 3 mmHg, the estimated right ventricular systolic pressure is 35.5 mmHg. Left Atrium: Left atrial size was moderately dilated. Right Atrium: Right atrial size was severely dilated. Pericardium: There is no evidence of pericardial effusion. Mitral Valve: The mitral valve is degenerative in appearance. There is mild thickening of the mitral valve leaflet(s). There is mild calcification of the mitral valve leaflet(s). Moderate mitral annular calcification. Mild to moderate mitral valve regurgitation. No evidence of mitral valve stenosis. Tricuspid Valve: The tricuspid valve is normal in structure. Tricuspid valve regurgitation is moderate to severe. No evidence of tricuspid stenosis. Aortic Valve: The aortic valve is calcified. There is moderate calcification of the aortic valve. There is moderate  thickening of the aortic valve. Aortic valve regurgitation is not visualized. Aortic valve sclerosis/calcification is present, without any  evidence of aortic stenosis. Pulmonic Valve: The pulmonic valve was not well visualized. Pulmonic valve regurgitation is mild. No evidence of pulmonic stenosis. Aorta: The aortic root and ascending aorta are structurally normal, with no evidence of dilitation and the aortic arch was not well visualized. Pulmonary Artery: The pulmonary artery is mildly dilated. Venous: The inferior vena cava is normal in size with greater than 50% respiratory variability, suggesting right atrial pressure of 3 mmHg. IAS/Shunts: The atrial septum is grossly normal.  LEFT VENTRICLE PLAX 2D LVIDd:         5.10 cm   Diastology LVIDs:         4.30 cm   LV e' medial:    8.81 cm/s LV PW:  1.00 cm   LV E/e' medial:  10.5 LV IVS:        1.00 cm   LV e' lateral:   9.03 cm/s LVOT diam:     1.90 cm   LV E/e' lateral: 10.3 LV SV:         43 LV SV Index:   27 LVOT Area:     2.84 cm                           3D Volume EF:                          3D EF:        42 %                          LV EDV:       146 ml                          LV ESV:       85 ml                          LV SV:        61 ml RIGHT VENTRICLE RV S prime:     8.17 cm/s LEFT ATRIUM             Index        RIGHT ATRIUM           Index LA diam:        5.30 cm 3.39 cm/m   RA Area:     25.80 cm LA Vol (A2C):   47.8 ml 30.53 ml/m  RA Volume:   83.60 ml  53.40 ml/m LA Vol (A4C):   50.8 ml 32.45 ml/m LA Biplane Vol: 49.4 ml 31.55 ml/m  AORTIC VALVE LVOT Vmax:   89.90 cm/s LVOT Vmean:  59.200 cm/s LVOT VTI:    0.151 m  AORTA Ao Root diam: 2.80 cm Ao Asc diam:  3.10 cm MITRAL VALVE                  TRICUSPID VALVE MV Area (PHT): 3.72 cm       TR Peak grad:   35.5 mmHg MV Decel Time: 204 msec       TR Vmax:        298.00 cm/s MR Peak grad:    168.5 mmHg MR Mean grad:    104.0 mmHg   SHUNTS MR Vmax:         649.00 cm/s  Systemic VTI:   0.15 m MR Vmean:        483.0 cm/s   Systemic Diam: 1.90 cm MR PISA:         1.01 cm MR PISA Eff ROA: 13 mm MR PISA Radius:  0.40 cm MV E velocity: 92.70 cm/s MV A velocity: 102.00 cm/s MV E/A ratio:  0.91 Buford Dresser MD Electronically signed by Buford Dresser MD Signature Date/Time: 04/11/2021/1:23:04 PM    Final    VAS [place] LOWER EXTREMITY VENOUS (DVT)  Result Date: 04/11/2021  Lower Venous DVT Study Patient Name:  CATELYNN SPARGER  Date of Exam:   04/11/2021 Medical Rec #: [id number]      Accession #:    [id number] Date of Birth: [birth year]  Patient Gender: F Patient Age:   56 years Exam Location:  [facility] Procedure:      VAS [place] LOWER EXTREMITY VENOUS (DVT) Referring Phys: TIMOTHY OPYD --------------------------------------------------------------------------------  Indications: Pulmonary embolism.  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 Comparison Study: No prior study Performing Technologist: Sharion Dove RVS  Examination Guidelines: A complete evaluation includes B-mode imaging, spectral Doppler, color Doppler, and power Doppler as needed of all accessible portions of each vessel. Bilateral testing is considered an integral part of a complete examination. Limited examinations for reoccurring indications may be performed as noted. The reflux portion of the exam is performed with the patient in reverse Trendelenburg.  +---------+---------------+---------+-----------+----------+-------------------+ RIGHT    CompressibilityPhasicitySpontaneityPropertiesThrombus Aging      +---------+---------------+---------+-----------+----------+-------------------+ CFV      Full                                         pulsatile waveforms +---------+---------------+---------+-----------+----------+-------------------+ SFJ      Full                                                              +---------+---------------+---------+-----------+----------+-------------------+ FV Prox  Full                                                             +---------+---------------+---------+-----------+----------+-------------------+ FV Mid   Full                                                             +---------+---------------+---------+-----------+----------+-------------------+ FV DistalFull                                                             +---------+---------------+---------+-----------+----------+-------------------+ PFV      Full                                                             +---------+---------------+---------+-----------+----------+-------------------+ POP      Full           No       No                                       +---------+---------------+---------+-----------+----------+-------------------+ PTV      Full                                                             +---------+---------------+---------+-----------+----------+-------------------+  PERO     Full                                                             +---------+---------------+---------+-----------+----------+-------------------+   +---------+---------------+---------+-----------+----------+-------------------+ LEFT     CompressibilityPhasicitySpontaneityPropertiesThrombus Aging      +---------+---------------+---------+-----------+----------+-------------------+ CFV      Full           No       Yes                  pulsatile flow      +---------+---------------+---------+-----------+----------+-------------------+ SFJ      Full                                                             +---------+---------------+---------+-----------+----------+-------------------+ FV Prox  Full                                                             +---------+---------------+---------+-----------+----------+-------------------+ FV  Mid   Full                                                             +---------+---------------+---------+-----------+----------+-------------------+ FV DistalFull                                                             +---------+---------------+---------+-----------+----------+-------------------+ PFV      Full                                                             +---------+---------------+---------+-----------+----------+-------------------+ POP      Full                                         Dilated with                                                              rouleaux flow       +---------+---------------+---------+-----------+----------+-------------------+ PTV      Full                                                             +---------+---------------+---------+-----------+----------+-------------------+  PERO     Full                                                             +---------+---------------+---------+-----------+----------+-------------------+ Gastroc  Full                                         Dilated with                                                              rouleaux flow       +---------+---------------+---------+-----------+----------+-------------------+    Summary: RIGHT: - There is no evidence of deep vein thrombosis in the lower extremity.  LEFT: - There is no evidence of deep vein thrombosis in the lower extremity.  - The popliteal and gastrocnemius veins are easily compressible but are dilated with rouleaux flow  *See table(s) above for measurements and observations.    Preliminary     Procedures Procedures   Medications Ordered in ED Medications  sodium chloride flush (NS) 0.9 % injection 3 mL (3 mLs Intravenous Given 04/11/21 1019)  acetaminophen (TYLENOL) tablet 650 mg (has no administration in time range)    Or  acetaminophen (TYLENOL) suppository 650 mg (has no administration in  time range)  senna (SENOKOT) tablet 8.6 mg (8.6 mg Oral Given 04/11/21 1022)  polyethylene glycol (MIRALAX / GLYCOLAX) packet 17 g (has no administration in time range)  ondansetron (ZOFRAN) tablet 4 mg (has no administration in time range)    Or  ondansetron (ZOFRAN) injection 4 mg (has no administration in time range)  diltiazem (CARDIZEM CD) 24 hr capsule 240 mg (240 mg Oral Given 04/11/21 1022)  dofetilide (TIKOSYN) capsule 125 mcg (125 mcg Oral Given 04/11/21 1121)  isosorbide mononitrate (IMDUR) 24 hr tablet 60 mg (60 mg Oral Given 04/11/21 1022)  rosuvastatin (CRESTOR) tablet 40 mg (40 mg Oral Given 04/11/21 1023)  FLUoxetine (PROZAC) capsule 40 mg (40 mg Oral Given 04/11/21 1023)  levothyroxine (SYNTHROID) tablet 75 mcg (75 mcg Oral Given 04/11/21 0613)  pantoprazole (PROTONIX) EC tablet 40 mg (40 mg Oral Given 04/11/21 1023)  albuterol (PROVENTIL) (2.5 MG/3ML) 0.083% nebulizer solution 2.5 mg (has no administration in time range)  fluticasone furoate-vilanterol (BREO ELLIPTA) 100-25 MCG/ACT 1 puff (1 puff Inhalation Given 04/11/21 1120)    And  umeclidinium bromide (INCRUSE ELLIPTA) 62.5 MCG/ACT 1 puff (1 puff Inhalation Given 04/11/21 1556)  HYDROmorphone (DILAUDID) injection 2 mg (has no administration in time range)  oxyCODONE (Oxy IR/ROXICODONE) immediate release tablet 10 mg (10 mg Oral Given 04/11/21 1550)  heparin ADULT infusion 100 units/mL (25000 units/273mL) (1,000 Units/hr Intravenous New Bag/Given 04/11/21 1535)  HYDROmorphone (DILAUDID) injection 0.5 mg (0.5 mg Intravenous Given 04/10/21 2244)  acetaminophen (TYLENOL) tablet 1,000 mg (1,000 mg Oral Given 04/10/21 2243)  HYDROmorphone (DILAUDID) injection 0.5 mg (0.5 mg Intravenous Given 04/11/21 0616)  iohexol (OMNIPAQUE) 350 MG/ML injection 50 mL (50 mLs Intravenous Contrast Given 04/11/21 0101)  heparin bolus via infusion 4,000 Units (4,000 Units Intravenous  Bolus from Bag 04/11/21 0207)  enoxaparin (LOVENOX)  injection 55 mg (55 mg Subcutaneous Given 04/11/21 0608)  potassium chloride SA (KLOR-CON) CR tablet 60 mEq (60 mEq Oral Given 04/11/21 1023)  magnesium sulfate IVPB 2 g 50 mL (0 g Intravenous Stopped 04/11/21 1122)    ED Course  I have reviewed the triage vital signs and the nursing notes.  Pertinent labs & imaging results that were available during my care of the patient were reviewed by me and considered in my medical decision making (see chart for details).    MDM Rules/Calculators/A&P                           80 yo F with a chief complaints of right-sided chest pain.  This suddenly worsened this evening and she is worried that she broke a rib from coughing.  Certainly possible, will obtain a chest x-ray to assess for spontaneous pneumothorax blood work treat pain and reassess.  CXR viewed by me with L sided disease.  Rads read with likely worsening of their lung mass.    Patient reassessed with some improvement of symptoms, with hemoptysis and sudden worsening pain will CT to eval for PE.   CRITICAL CARE Performed by: Cecilio Asper   Total critical care time: 35 minutes  Critical care time was exclusive of separately billable procedures and treating other patients.  Critical care was necessary to treat or prevent imminent or life-threatening deterioration.  Critical care was time spent personally by me on the following activities: development of treatment plan with patient and/or surrogate as well as nursing, discussions with consultants, evaluation of patient's response to treatment, examination of patient, obtaining history from patient or surrogate, ordering and performing treatments and interventions, ordering and review of laboratory studies, ordering and review of radiographic studies, pulse oximetry and re-evaluation of patient's condition.  The patients results and plan were reviewed and discussed.   Any x-rays performed were independently reviewed by myself.    Differential diagnosis were considered with the presenting HPI.  Medications  sodium chloride flush (NS) 0.9 % injection 3 mL (3 mLs Intravenous Given 04/11/21 1019)  acetaminophen (TYLENOL) tablet 650 mg (has no administration in time range)    Or  acetaminophen (TYLENOL) suppository 650 mg (has no administration in time range)  senna (SENOKOT) tablet 8.6 mg (8.6 mg Oral Given 04/11/21 1022)  polyethylene glycol (MIRALAX / GLYCOLAX) packet 17 g (has no administration in time range)  ondansetron (ZOFRAN) tablet 4 mg (has no administration in time range)    Or  ondansetron (ZOFRAN) injection 4 mg (has no administration in time range)  diltiazem (CARDIZEM CD) 24 hr capsule 240 mg (240 mg Oral Given 04/11/21 1022)  dofetilide (TIKOSYN) capsule 125 mcg (125 mcg Oral Given 04/11/21 1121)  isosorbide mononitrate (IMDUR) 24 hr tablet 60 mg (60 mg Oral Given 04/11/21 1022)  rosuvastatin (CRESTOR) tablet 40 mg (40 mg Oral Given 04/11/21 1023)  FLUoxetine (PROZAC) capsule 40 mg (40 mg Oral Given 04/11/21 1023)  levothyroxine (SYNTHROID) tablet 75 mcg (75 mcg Oral Given 04/11/21 0613)  pantoprazole (PROTONIX) EC tablet 40 mg (40 mg Oral Given 04/11/21 1023)  albuterol (PROVENTIL) (2.5 MG/3ML) 0.083% nebulizer solution 2.5 mg (has no administration in time range)  fluticasone furoate-vilanterol (BREO ELLIPTA) 100-25 MCG/ACT 1 puff (1 puff Inhalation Given 04/11/21 1120)    And  umeclidinium bromide (INCRUSE ELLIPTA) 62.5 MCG/ACT 1 puff (1 puff Inhalation Given 04/11/21 1556)  HYDROmorphone (  DILAUDID) injection 2 mg (has no administration in time range)  oxyCODONE (Oxy IR/ROXICODONE) immediate release tablet 10 mg (10 mg Oral Given 04/11/21 1550)  heparin ADULT infusion 100 units/mL (25000 units/244mL) (1,000 Units/hr Intravenous New Bag/Given 04/11/21 1535)  HYDROmorphone (DILAUDID) injection 0.5 mg (0.5 mg Intravenous Given 04/10/21 2244)  acetaminophen (TYLENOL) tablet 1,000 mg (1,000 mg Oral  Given 04/10/21 2243)  HYDROmorphone (DILAUDID) injection 0.5 mg (0.5 mg Intravenous Given 04/11/21 0616)  iohexol (OMNIPAQUE) 350 MG/ML injection 50 mL (50 mLs Intravenous Contrast Given 04/11/21 0101)  heparin bolus via infusion 4,000 Units (4,000 Units Intravenous Bolus from Bag 04/11/21 0207)  enoxaparin (LOVENOX) injection 55 mg (55 mg Subcutaneous Given 04/11/21 0608)  potassium chloride SA (KLOR-CON) CR tablet 60 mEq (60 mEq Oral Given 04/11/21 1023)  magnesium sulfate IVPB 2 g 50 mL (0 g Intravenous Stopped 04/11/21 1122)    Vitals:   04/11/21 1200 04/11/21 1405 04/11/21 1500 04/11/21 1551  BP: (!) 124/57 105/66 124/80 117/64  Pulse: 73 75 80 74  Resp: (!) 22 (!) 24 (!) 22 18  Temp:      TempSrc:      SpO2: 95% 94% 93% 97%  Weight:      Height:        Final diagnoses:  Other acute pulmonary embolism, unspecified whether acute cor pulmonale present ([place])      Final Clinical Impression(s) / ED Diagnoses Final diagnoses:  Other acute pulmonary embolism, unspecified whether acute cor pulmonale present [facility])    Rx / DC Orders ED Discharge Orders     None        Deno Etienne, DO 04/11/21 1651

## 2021-04-10 NOTE — ED Notes (Signed)
Pt placed on home O2 of 2L - pt satting at 89% on RA

## 2021-04-10 NOTE — ED Provider Notes (Signed)
  Provider Note MRN:  [id number]  Arrival date & time: 04/11/21    ED Course and Medical Decision Making  Assumed care from Dr. Tyrone Nine at shift change.  Lung mass, hemoptysis, awaiting CT scan.  CT reveals acute pulmonary thrombus, question if related to lung mass.  Will admit to medicine.  Procedures  Final Clinical Impressions(s) / ED Diagnoses     ICD-10-CM   1. Other acute pulmonary embolism, unspecified whether acute cor pulmonale present [facility]  I26.99       ED Discharge Orders     None       Discharge Instructions   None     Barth Kirks. Sedonia Small, [place] [email or web address]    Maudie Flakes, MD 04/11/21 [phone]

## 2021-04-11 ENCOUNTER — Emergency Department (HOSPITAL_COMMUNITY): Payer: Medicare HMO

## 2021-04-11 ENCOUNTER — Encounter (HOSPITAL_COMMUNITY): Payer: Self-pay | Admitting: Family Medicine

## 2021-04-11 ENCOUNTER — Observation Stay (HOSPITAL_BASED_OUTPATIENT_CLINIC_OR_DEPARTMENT_OTHER): Payer: Medicare HMO

## 2021-04-11 DIAGNOSIS — I2609 Other pulmonary embolism with acute cor pulmonale: Secondary | ICD-10-CM

## 2021-04-11 DIAGNOSIS — I482 Chronic atrial fibrillation, unspecified: Secondary | ICD-10-CM | POA: Diagnosis not present

## 2021-04-11 DIAGNOSIS — I2699 Other pulmonary embolism without acute cor pulmonale: Secondary | ICD-10-CM | POA: Diagnosis not present

## 2021-04-11 DIAGNOSIS — I5032 Chronic diastolic (congestive) heart failure: Secondary | ICD-10-CM

## 2021-04-11 DIAGNOSIS — I25708 Atherosclerosis of coronary artery bypass graft(s), unspecified, with other forms of angina pectoris: Secondary | ICD-10-CM | POA: Diagnosis not present

## 2021-04-11 DIAGNOSIS — J9611 Chronic respiratory failure with hypoxia: Secondary | ICD-10-CM

## 2021-04-11 DIAGNOSIS — J449 Chronic obstructive pulmonary disease, unspecified: Secondary | ICD-10-CM

## 2021-04-11 DIAGNOSIS — D509 Iron deficiency anemia, unspecified: Secondary | ICD-10-CM | POA: Diagnosis present

## 2021-04-11 DIAGNOSIS — E039 Hypothyroidism, unspecified: Secondary | ICD-10-CM

## 2021-04-11 DIAGNOSIS — M8448XA Pathological fracture, other site, initial encounter for fracture: Secondary | ICD-10-CM

## 2021-04-11 LAB — ECHOCARDIOGRAM COMPLETE
Area-P 1/2: 3.72 cm2
Height: 62 in
MV M vel: 6.49 m/s
MV Peak grad: 168.5 mmHg
Radius: 0.4 cm
S' Lateral: 4.3 cm
Weight: 2000 oz

## 2021-04-11 LAB — BASIC METABOLIC PANEL
Anion gap: 9 (ref 5–15)
BUN: 10 mg/dL (ref 8–23)
CO2: 27 mmol/L (ref 22–32)
Calcium: 8.1 mg/dL — ABNORMAL LOW (ref 8.9–10.3)
Chloride: 96 mmol/L — ABNORMAL LOW (ref 98–111)
Creatinine, Ser: 0.75 mg/dL (ref 0.44–1.00)
GFR, Estimated: >60 mL/min (ref >60)
Glucose, Bld: 135 mg/dL — ABNORMAL HIGH (ref 70–99)
Potassium: 3.5 mmol/L (ref 3.5–5.1)
Sodium: 132 mmol/L — ABNORMAL LOW (ref 135–145)

## 2021-04-11 LAB — RESP PANEL BY RT-PCR (FLU A&B, COVID) ARPGX2
Influenza A by PCR: NEGATIVE
Influenza B by PCR: NEGATIVE
SARS Coronavirus 2 by RT PCR: NEGATIVE

## 2021-04-11 LAB — APTT
aPTT: >200 seconds (ref 24–36)
aPTT: 57 seconds — ABNORMAL HIGH (ref 24–36)

## 2021-04-11 LAB — HEPARIN LEVEL (UNFRACTIONATED)
Heparin Unfractionated: >1.1 IU/mL — ABNORMAL HIGH (ref 0.30–0.70)
Heparin Unfractionated: 0.49 IU/mL (ref 0.30–0.70)

## 2021-04-11 LAB — MAGNESIUM: Magnesium: 1.8 mg/dL (ref 1.7–2.4)

## 2021-04-11 MED ORDER — MAGNESIUM SULFATE 2 GM/50ML IV SOLN
2.0000 g | Freq: Once | INTRAVENOUS | Status: AC
  Administered 2021-04-11: 10:00:00 2 g via INTRAVENOUS
  Filled 2021-04-11: qty 50

## 2021-04-11 MED ORDER — UMECLIDINIUM BROMIDE 62.5 MCG/ACT IN AEPB
1.0000 | INHALATION_SPRAY | Freq: Every day | RESPIRATORY_TRACT | Status: DC
  Administered 2021-04-11 – 2021-04-13 (×3): 1 via RESPIRATORY_TRACT
  Filled 2021-04-11 (×2): qty 7

## 2021-04-11 MED ORDER — ACETAMINOPHEN 325 MG PO TABS: 650.0000 mg | ORAL_TABLET | Freq: Four times a day (QID) | ORAL | Status: DC | PRN

## 2021-04-11 MED ORDER — DOFETILIDE 125 MCG PO CAPS
125.0000 ug | ORAL_CAPSULE | Freq: Two times a day (BID) | ORAL | Status: DC
  Administered 2021-04-11 – 2021-04-24 (×27): 125 ug via ORAL
  Filled 2021-04-11 (×30): qty 1

## 2021-04-11 MED ORDER — HEPARIN (PORCINE) 25000 UT/250ML-% IV SOLN
INTRAVENOUS | Status: AC
  Administered 2021-04-11: 02:00:00 4000 [IU] via INTRAVENOUS
  Filled 2021-04-11: qty 250

## 2021-04-11 MED ORDER — FLUTICASONE-UMECLIDIN-VILANT 100-62.5-25 MCG/ACT IN AEPB: 1.0000 | INHALATION_SPRAY | Freq: Every day | RESPIRATORY_TRACT | Status: DC

## 2021-04-11 MED ORDER — ONDANSETRON HCL 4 MG PO TABS: 4.0000 mg | ORAL_TABLET | Freq: Four times a day (QID) | ORAL | Status: DC | PRN

## 2021-04-11 MED ORDER — HYDROMORPHONE HCL 1 MG/ML IJ SOLN
2.0000 mg | INTRAMUSCULAR | Status: DC | PRN
  Administered 2021-04-11 – 2021-04-15 (×3): 2 mg via INTRAVENOUS
  Administered 2021-04-16: 1 mg via INTRAVENOUS
  Filled 2021-04-11 (×4): qty 2

## 2021-04-11 MED ORDER — IOHEXOL 350 MG/ML SOLN
50.0000 mL | Freq: Once | INTRAVENOUS | Status: AC | PRN
  Administered 2021-04-11: 01:00:00 50 mL via INTRAVENOUS

## 2021-04-11 MED ORDER — OXYCODONE HCL 5 MG PO TABS
10.0000 mg | ORAL_TABLET | ORAL | Status: DC | PRN
  Administered 2021-04-11 – 2021-04-25 (×21): 10 mg via ORAL
  Filled 2021-04-11 (×21): qty 2

## 2021-04-11 MED ORDER — ONDANSETRON HCL 4 MG/2ML IJ SOLN
4.0000 mg | Freq: Four times a day (QID) | INTRAMUSCULAR | Status: DC | PRN
  Administered 2021-04-17: 4 mg via INTRAVENOUS
  Filled 2021-04-11: qty 2

## 2021-04-11 MED ORDER — POTASSIUM CHLORIDE CRYS ER 20 MEQ PO TBCR
60.0000 meq | EXTENDED_RELEASE_TABLET | Freq: Once | ORAL | Status: AC
  Administered 2021-04-11: 10:00:00 60 meq via ORAL
  Filled 2021-04-11: qty 3

## 2021-04-11 MED ORDER — FLUOXETINE HCL 20 MG PO CAPS
40.0000 mg | ORAL_CAPSULE | Freq: Every day | ORAL | Status: DC
  Administered 2021-04-11 – 2021-04-25 (×15): 40 mg via ORAL
  Filled 2021-04-11 (×15): qty 2

## 2021-04-11 MED ORDER — HEPARIN BOLUS VIA INFUSION
4000.0000 [IU] | Freq: Once | INTRAVENOUS | Status: AC
  Filled 2021-04-11: qty 4000

## 2021-04-11 MED ORDER — ISOSORBIDE MONONITRATE ER 60 MG PO TB24
60.0000 mg | ORAL_TABLET | Freq: Two times a day (BID) | ORAL | Status: DC
  Administered 2021-04-11 – 2021-04-16 (×12): 60 mg via ORAL
  Filled 2021-04-11 (×5): qty 1
  Filled 2021-04-11: qty 2
  Filled 2021-04-11 (×7): qty 1

## 2021-04-11 MED ORDER — ENOXAPARIN SODIUM 60 MG/0.6ML IJ SOSY
55.0000 mg | PREFILLED_SYRINGE | Freq: Two times a day (BID) | INTRAMUSCULAR | Status: DC
  Filled 2021-04-11 (×2): qty 0.55

## 2021-04-11 MED ORDER — FLUTICASONE FUROATE-VILANTEROL 100-25 MCG/ACT IN AEPB
1.0000 | INHALATION_SPRAY | Freq: Every day | RESPIRATORY_TRACT | Status: DC
  Administered 2021-04-11 – 2021-04-13 (×3): 1 via RESPIRATORY_TRACT
  Filled 2021-04-11 (×2): qty 28

## 2021-04-11 MED ORDER — DILTIAZEM HCL ER COATED BEADS 240 MG PO CP24
240.0000 mg | ORAL_CAPSULE | Freq: Every day | ORAL | Status: DC
  Administered 2021-04-11 – 2021-04-24 (×14): 240 mg via ORAL
  Filled 2021-04-11 (×14): qty 1

## 2021-04-11 MED ORDER — HEPARIN (PORCINE) 25000 UT/250ML-% IV SOLN
1750.0000 [IU]/h | INTRAVENOUS | Status: DC
  Administered 2021-04-11: 16:00:00 1000 [IU]/h via INTRAVENOUS
  Administered 2021-04-12: 1250 [IU]/h via INTRAVENOUS
  Administered 2021-04-13: 1350 [IU]/h via INTRAVENOUS
  Administered 2021-04-14: 23:00:00 1650 [IU]/h via INTRAVENOUS
  Filled 2021-04-11 (×5): qty 250

## 2021-04-11 MED ORDER — ALBUTEROL SULFATE (2.5 MG/3ML) 0.083% IN NEBU
2.5000 mg | INHALATION_SOLUTION | Freq: Four times a day (QID) | RESPIRATORY_TRACT | Status: DC | PRN
  Administered 2021-04-12 – 2021-04-14 (×3): 2.5 mg via RESPIRATORY_TRACT
  Filled 2021-04-11 (×3): qty 3

## 2021-04-11 MED ORDER — ACETAMINOPHEN 650 MG RE SUPP: 650.0000 mg | Freq: Four times a day (QID) | RECTAL | Status: DC | PRN

## 2021-04-11 MED ORDER — OXYCODONE HCL 5 MG PO TABS
5.0000 mg | ORAL_TABLET | ORAL | Status: DC | PRN
  Administered 2021-04-11: 10:00:00 5 mg via ORAL
  Filled 2021-04-11: qty 1

## 2021-04-11 MED ORDER — LEVOTHYROXINE SODIUM 75 MCG PO TABS
75.0000 ug | ORAL_TABLET | Freq: Every day | ORAL | Status: DC
  Administered 2021-04-11 – 2021-04-24 (×13): 75 ug via ORAL
  Filled 2021-04-11 (×13): qty 1

## 2021-04-11 MED ORDER — PANTOPRAZOLE SODIUM 40 MG PO TBEC
40.0000 mg | DELAYED_RELEASE_TABLET | Freq: Every day | ORAL | Status: DC
  Administered 2021-04-11 – 2021-04-25 (×15): 40 mg via ORAL
  Filled 2021-04-11 (×15): qty 1

## 2021-04-11 MED ORDER — HEPARIN (PORCINE) 25000 UT/250ML-% IV SOLN
1000.0000 [IU]/h | INTRAVENOUS | Status: DC
  Administered 2021-04-11: 02:00:00 1000 [IU]/h via INTRAVENOUS

## 2021-04-11 MED ORDER — ROSUVASTATIN CALCIUM 20 MG PO TABS
40.0000 mg | ORAL_TABLET | Freq: Every day | ORAL | Status: DC
  Administered 2021-04-11 – 2021-04-24 (×14): 40 mg via ORAL
  Filled 2021-04-11 (×14): qty 2

## 2021-04-11 MED ORDER — POLYETHYLENE GLYCOL 3350 17 G PO PACK
17.0000 g | PACK | Freq: Every day | ORAL | Status: DC | PRN
  Administered 2021-04-18: 17 g via ORAL
  Filled 2021-04-11: qty 1

## 2021-04-11 MED ORDER — HYDROMORPHONE HCL 1 MG/ML IJ SOLN: 0.5000 mg | INTRAMUSCULAR | Status: DC | PRN

## 2021-04-11 MED ORDER — ENOXAPARIN SODIUM 60 MG/0.6ML IJ SOSY
55.0000 mg | PREFILLED_SYRINGE | Freq: Once | INTRAMUSCULAR | Status: AC
  Administered 2021-04-11: 06:00:00 55 mg via SUBCUTANEOUS
  Filled 2021-04-11: qty 0.55

## 2021-04-11 MED ORDER — ORAL CARE MOUTH RINSE
15.0000 mL | Freq: Two times a day (BID) | OROMUCOSAL | Status: DC
  Administered 2021-04-12 – 2021-04-24 (×20): 15 mL via OROMUCOSAL

## 2021-04-11 MED ORDER — SENNA 8.6 MG PO TABS
1.0000 | ORAL_TABLET | Freq: Two times a day (BID) | ORAL | Status: DC
  Administered 2021-04-11 – 2021-04-18 (×10): 8.6 mg via ORAL
  Filled 2021-04-11 (×15): qty 1

## 2021-04-11 MED ORDER — SODIUM CHLORIDE 0.9% FLUSH
3.0000 mL | Freq: Two times a day (BID) | INTRAVENOUS | Status: DC
  Administered 2021-04-11 – 2021-04-24 (×14): 3 mL via INTRAVENOUS

## 2021-04-11 NOTE — ED Notes (Signed)
This RN verified Heparin orders with pharmacy prior to heparin administration.

## 2021-04-11 NOTE — Progress Notes (Addendum)
ANTICOAGULATION CONSULT NOTE - Initial Consult  Pharmacy Consult for Heparin -> Lovenox Indication: pulmonary embolus  Allergies  Allergen Reactions   Erythromycin Other (See Comments)    Made the patient's tongue "burn"   Tape Rash and Other (See Comments)    PLEASE USE PAPER TAPE!!   Meperidine Nausea And Vomiting   Shellfish Allergy Nausea And Vomiting   Ciprofloxacin Rash and Other (See Comments)    Rash from IV   Codeine Nausea And Vomiting   Latex Rash and Other (See Comments)    Made the patient's skin "burn," also   Penicillins Rash and Other (See Comments)    Has patient had a PCN reaction causing immediate rash, facial/tongue/throat swelling, SOB or lightheadedness with hypotension: Unk Has patient had a PCN reaction causing severe rash involving mucus membranes or skin necrosis: No Has patient had a PCN reaction that required hospitalization: No Has patient had a PCN reaction occurring within the last 10 years: No If all of the above answers are "NO", then may proceed with Cephalosporin use.     Patient Measurements: Height: 5\' 2"  (157.5 cm) Weight: 56.7 kg (125 lb) IBW/kg (Calculated) : 50.1 Heparin Dosing Weight: 56 kg  Vital Signs: Temp: 98.7 F (37.1 C) (11/16 2126) Temp Source: Oral (11/16 2126) BP: 141/58 (11/17 0200) Pulse Rate: 64 (11/17 0200)  Labs: Recent Labs    04/10/21 2122  HGB 10.5*  HCT 35.2*  PLT 331  CREATININE 0.84  TROPONINIHS 10    Estimated Creatinine Clearance: 40.8 mL/min (by C-G formula based on SCr of 0.84 mg/dL).   Medical History: Past Medical History:  Diagnosis Date   AAA (abdominal aortic aneurysm) 01/2009   AAA (2.8 x 3.0)  moderate RAS (left); 2.7 x 2.7 cm (07/11/05)   Acute bronchitis 03/20/2013; 2017   Angina    Anosmia    Asthma    Baker's cyst of knee 01/30/2014   Basal cell carcinoma    "back and left arm"   Bradycardia    Metoprolol stopped 08/2011   CAD (coronary artery disease)    s/CABG (reports IMA  and 2 SVGs) back in 1999  Myoview normal 3/10; s/p PCI with DES to PL branch of distal RCA 09/2011; PCI +DES to SVG-RCA, PCI + DES to mid LCx 12/2015   Cerebrovascular disease 01/2009   carotid u/s  R 0-39%   L 60-79%   Chronic thoracic back pain    COPD (chronic obstructive pulmonary disease) (HCC)    Depression    Dizziness    Dysrhythmia    hx of sinus brady   GERD (gastroesophageal reflux disease)    Heart murmur    Herniated lumbar disc without myelopathy    History of blood transfusion 1999   "when I had the bypass; had a PE"   Hyperglycemia 10/23/2015   Hyperlipidemia    Hypertension    Hypothyroidism 09/30/2016   Medicare annual wellness visit, subsequent 07/31/2013   NSTEMI (non-ST elevated myocardial infarction) ([place]) 11/12   Cath showed atretic IMA graft to the LAD, SVG to PD was patent but the continuation of this graft to the PL branch was occluded; there were L to R collaterals; Mid LAD had a 60 to 70% stenosis. She has been treated medically.  Neg Myoview 05/2011   Osteoarthritis of back    Osteoporosis    Pneumonia "several times"   Pulmonary embolism ([place]) 1999   "after my bypass"   PVD (peripheral vascular disease) ([place])  Shortness of breath    Squamous carcinoma    "nose"   Thyroid disease    Hypothyroid   Unstable angina ([place]) 09/25/2015    Medications:  See electronic med rec  Assessment: 80 y.o. F presents with SOB and coughing up blood. Found to have PE on CT scan. Noted pt was on Eliquis PTA for afib - last dose was 11/15 pm - pt was holding Eliquis for planned biopsy on 11/17. Since >24 hours past last dose and pt with PE, will give bolus and start heparin gtt. Will need to utilize PTT monitoring since Eliquis likely affecting heparin level until levels correlating.  Goal of Therapy:  Heparin level 0.3-0.7 units/ml, aPTT 66-102 sec Monitor platelets by anticoagulation protocol: Yes   Plan: (started during downtime by Dr. Sedonia Small) Heparin IV bolus 4000  units Heparin gtt at 1000 units/hr Will f/u heparin level and aPTT in 8 hours Daily heparin level and CBC  Sherlon Handing, PharmD, BCPS Please see amion for complete clinical pharmacist phone list 04/11/2021,2:18 AM  ADDENDUM [phone]): Admitting MD would like to change heparin to Lovenox in active cancer patient with PE.  Plan: Stop heparin infusion Lovenox 55 mg SQ q12h - first dose at 0500 (since bolus just given ~0200) CBC q72h while on Lovenox.  Sherlon Handing, PharmD, BCPS Please see amion for complete clinical pharmacist phone list 04/11/2021 3:09 AM

## 2021-04-11 NOTE — H&P (Signed)
History and Physical    Andrea [name] VHQ:[id number] DOB: Sep 04, 1940 DOA: 04/10/2021  PCP: Mosie Lukes, MD   Patient coming from: Home   Chief Complaint: Right chest pain, "I think I broke a rib"  HPI: Andrea [name] is a pleasant 80 y.o. female with medical history significant for COPD with chronic hypoxic respiratory failure, OSA, atrial fibrillation on Eliquis, coronary artery disease, chronic diastolic CHF, hypothyroidism, and recently identified left lung mass, now presenting to the emergency department for evaluation of acute onset severe pain in the right chest wall.  She reports coughing last night and experiencing acute onset of severe pain in her posterolateral right chest wall.  She reports feeling as though she broke her rib.  Patient reports that she has had some hemoptysis for at least a month now, work-up revealed a left upper lobe mass and lytic lesion in the posterior right ninth rib.  She is following with pulmonology and was planned for biopsy on 04/15/2021.  She held her Eliquis yesterday in preparation for the biopsy but denies missing any doses prior to that.  She denies any significant change in her chronic cough and chronic dyspnea.  She has not noticed any leg swelling or tenderness.  ED Course: Upon arrival to the ED, patient is found to be afebrile, saturating well on 2 L/min of supplemental oxygen, and with stable blood pressure.  EKG with significant artifact, appears to be atrial fibrillation.  Chest x-ray with enlarging left upper lobe mass and increasing diffuse left lung airspace disease.  CTA chest concerning for bland thrombus versus tumor thrombus involving left upper lobe pulmonary artery branch without heart strain, as well as right posterior ninth rib lytic lesion with pathologic fracture.  Patient was given acetaminophen and multiple doses of IV Dilaudid in the ED.  Review of Systems:  All other systems reviewed and apart from HPI, are negative.  Past  Medical History:  Diagnosis Date   AAA (abdominal aortic aneurysm) 01/2009   AAA (2.8 x 3.0)  moderate RAS (left); 2.7 x 2.7 cm (07/11/05)   Acute bronchitis 03/20/2013; 2017   Angina    Anosmia    Asthma    Baker's cyst of knee 01/30/2014   Basal cell carcinoma    "back and left arm"   Bradycardia    Metoprolol stopped 08/2011   CAD (coronary artery disease)    s/CABG (reports IMA and 2 SVGs) back in 1999  Myoview normal 3/10; s/p PCI with DES to PL branch of distal RCA 09/2011; PCI +DES to SVG-RCA, PCI + DES to mid LCx 12/2015   Cerebrovascular disease 01/2009   carotid u/s  R 0-39%   L 60-79%   Chronic thoracic back pain    COPD (chronic obstructive pulmonary disease) (HCC)    Depression    Dizziness    Dysrhythmia    hx of sinus brady   GERD (gastroesophageal reflux disease)    Heart murmur    Herniated lumbar disc without myelopathy    History of blood transfusion 1999   "when I had the bypass; had a PE"   Hyperglycemia 10/23/2015   Hyperlipidemia    Hypertension    Hypothyroidism 09/30/2016   Medicare annual wellness visit, subsequent 07/31/2013   NSTEMI (non-ST elevated myocardial infarction) ([place]) 11/12   Cath showed atretic IMA graft to the LAD, SVG to PD was patent but the continuation of this graft to the PL branch was occluded; there were L to R collaterals; Mid LAD had  a 60 to 70% stenosis. She has been treated medically.  Neg Myoview 05/2011   Osteoarthritis of back    Osteoporosis    Pneumonia "several times"   Pulmonary embolism ([place]) 1999   "after my bypass"   PVD (peripheral vascular disease) ([place])    Shortness of breath    Squamous carcinoma    "nose"   Thyroid disease    Hypothyroid   Unstable angina ([place]) 09/25/2015    Past Surgical History:  Procedure Laterality Date   ABDOMINAL AORTIC ANEURYSM REPAIR     pt denies this hx on 01/02/2016   BASAL CELL CARCINOMA EXCISION     CARDIAC CATHETERIZATION N/A 01/02/2016   Procedure: Left Heart Cath and Coronary  Angiography;  Surgeon: Wellington Hampshire, MD;  Location: [facility];  Service: Cardiovascular;  Laterality: N/A;   CARDIAC CATHETERIZATION  1996; 1999   CARDIAC CATHETERIZATION N/A 06/25/2016   Procedure: Left Heart Cath and Cors/Grafts Angiography;  Surgeon: Wellington Hampshire, MD;  Location: [facility];  Service: Cardiovascular;  Laterality: N/A;   CARDIOVERSION N/A 10/19/2019   Procedure: CARDIOVERSION;  Surgeon: Jerline Pain, MD;  Location: [facility];  Service: Cardiovascular;  Laterality: N/A;   CARDIOVERSION N/A 04/25/2020   Procedure: CARDIOVERSION;  Surgeon: Jerline Pain, MD;  Location: [facility] ENDOSCOPY;  Service: Cardiovascular;  Laterality: N/A;   CAROTID ENDARTERECTOMY Left 01/02/2011   CATARACT EXTRACTION W/ INTRAOCULAR LENS  IMPLANT, BILATERAL     CLOSED REDUCTION NASAL FRACTURE  11/2007   CORONARY ANGIOPLASTY WITH STENT PLACEMENT  10/10/2011   drug eluting  to rc & saphenous   CORONARY ARTERY BYPASS GRAFT  1999   "CABG X3"   DILATION AND CURETTAGE OF UTERUS     FEMORAL ARTERY STENT Bilateral    FRACTURE SURGERY     LEFT HEART CATHETERIZATION WITH CORONARY/GRAFT ANGIOGRAM N/A 04/22/2011   Procedure: LEFT HEART CATHETERIZATION WITH Beatrix Fetters;  Surgeon: Jolaine Artist, MD;  Location: [facility];  Service: Cardiovascular;  Laterality: N/A;   LEFT HEART CATHETERIZATION WITH CORONARY/GRAFT ANGIOGRAM N/A 10/10/2011   Procedure: LEFT HEART CATHETERIZATION WITH Beatrix Fetters;  Surgeon: Peter M Martinique, MD;  Location: [facility] CATH LAB;  Service: Cardiovascular;  Laterality: N/A;   NASAL SINUS SURGERY     twice   SQUAMOUS CELL CARCINOMA EXCISION     SVT ABLATION N/A 03/19/2018   Procedure: SVT ABLATION;  Surgeon: Constance Haw, MD;  Location: [facility];  Service: Cardiovascular;  Laterality: N/A;   TUBAL LIGATION      Social History:   reports that she quit smoking about 28 years ago. Her smoking use included cigarettes. She started  smoking about 64 years ago. She has a 20.00 pack-year smoking history. She has never used smokeless tobacco. She reports current alcohol use. She reports that she does not use drugs.  Allergies  Allergen Reactions   Erythromycin Other (See Comments)    Made the patient's tongue "burn"   Tape Rash and Other (See Comments)    PLEASE USE PAPER TAPE!!   Meperidine Nausea And Vomiting   Shellfish Allergy Nausea And Vomiting   Ciprofloxacin Rash and Other (See Comments)    Rash from IV   Codeine Nausea And Vomiting   Latex Rash and Other (See Comments)    Made the patient's skin "burn," also   Penicillins Rash and Other (See Comments)    Has patient had a PCN reaction causing immediate rash, facial/tongue/throat swelling, SOB or  lightheadedness with hypotension: Unk Has patient had a PCN reaction causing severe rash involving mucus membranes or skin necrosis: No Has patient had a PCN reaction that required hospitalization: No Has patient had a PCN reaction occurring within the last 10 years: No If all of the above answers are "NO", then may proceed with Cephalosporin use.     Family History  Problem Relation Age of Onset   Heart attack Mother 67   Diabetes Mother    Colon cancer Father 76   Lung cancer Father        smoked   Heart disease Father    Hypertension Father    Angina Father    Lung cancer Sister        smoked   Hypothyroidism Sister    Hypertension Brother    Heart attack Brother    Heart disease Brother        PTCA with Stent   Heart attack Brother        CABG   Heart disease Brother        CABG with 1 bypass   Aneurysm Brother        brain   Alcohol abuse Brother    Hyperlipidemia Daughter    Diabetes Maternal Grandmother    Stroke Maternal Grandmother    Cirrhosis Maternal Grandfather    Stroke Paternal Grandmother    Obesity Daughter    Obesity Son      Prior to Admission medications   Medication Sig Start Date End Date Taking? Authorizing Provider   acetaminophen (TYLENOL) 650 MG CR tablet Take 1,300 mg by mouth 2 (two) times daily.    [provider]  albuterol (PROVENTIL) (2.5 MG/3ML) 0.083% nebulizer solution Take 3 mLs (2.5 mg total) by nebulization every 4 (four) hours as needed for wheezing or shortness of breath. Dx:J44.9 Patient not taking: Reported on 04/02/2021 03/25/19   Flora Lipps, MD  albuterol (PROVENTIL) (5 MG/ML) 0.5% nebulizer solution Take 0.5 mLs (2.5 mg total) by nebulization every 6 (six) hours as needed for wheezing or shortness of breath. 04/10/20   Charlesetta Shanks, MD  albuterol (VENTOLIN HFA) 108 (90 Base) MCG/ACT inhaler Inhale into the lungs every 6 (six) hours as needed for wheezing or shortness of breath. Patient not taking: Reported on 04/02/2021    [provider]  albuterol (VENTOLIN HFA) 108 (90 Base) MCG/ACT inhaler Inhale 2 puffs into the lungs every 6 (six) hours as needed for wheezing or shortness of breath. 03/05/21   Parrett, Fonnie Mu, NP  azelastine (ASTELIN) 0.1 % nasal spray Place 2 sprays into both nostrils at bedtime as needed for rhinitis. 06/28/18   Mosie Lukes, MD  Biotin 5 MG TABS Take 5 mg by mouth daily.     [provider]  Calcium Carbonate-Vitamin D (CALCIUM + D PO) Take 1 tablet by mouth 2 (two) times a week.     [provider]  Cholecalciferol (VITAMIN D3) 2000 units TABS Take 2,000 Units by mouth daily.     [provider]  clopidogrel (PLAVIX) 75 MG tablet TAKE 1 TABLET BY MOUTH ONCE A DAY 01/09/21   Minna Merritts, MD  diltiazem (CARDIZEM CD) 240 MG 24 hr capsule TAKE 1 CAPSULE BY MOUTH ONCE DAILY 09/12/20   Bhagat, Bhavinkumar, PA  diltiazem (CARDIZEM) 30 MG tablet Take one tablet by mouth every 4 hours AS NEEDED for A-fib HR > 100 as long as BP > 100. 11/11/18   Fenton, [place] R, PA  dofetilide (TIKOSYN) 125 MCG capsule TAKE 1 CAPSULE BY MOUTH TWICE DAILY 02/26/21   Baldwin Jamaica, PA-C  doxycycline (VIBRA-TABS) 100 MG tablet Take 1  tablet (100 mg total) by mouth 2 (two) times daily. 03/05/21   Parrett, Fonnie Mu, NP  ELIQUIS 2.5 MG TABS tablet TAKE 1 TABLET BY MOUTH TWICE A DAY 02/26/21   Camnitz, Will Hassell Done, MD  ezetimibe (ZETIA) 10 MG tablet TAKE 1 TABLET BY MOUTH DAILY 01/09/21   Minna Merritts, MD  ferrous fumarate (HEMOCYTE - 106 MG FE) 325 (106 Fe) MG TABS tablet Take 1 tablet (106 mg of iron total) by mouth daily. 01/15/21   Mosie Lukes, MD  FLUoxetine (PROZAC) 40 MG capsule Take 1 capsule (40 mg total) by mouth daily. 02/27/21   Mosie Lukes, MD  Fluticasone-Umeclidin-Vilant (TRELEGY ELLIPTA) 100-62.5-25 MCG/INH AEPB INAHLE 1 PUFF INTO THE LUNGS DAILY 02/17/21   Collene Gobble, MD  folic acid (FOLVITE) 1 MG tablet Take 1 tablet (1 mg total) by mouth daily. 01/15/21   Mosie Lukes, MD  furosemide (LASIX) 20 MG tablet Take 1 tablet (20 mg total) by mouth as directed. Take 1 tablet (20 mg) daily and extra 1 tablet (20 mg) at 2 PM for shortness of breath or abdominal swelling 01/27/20   Minna Merritts, MD  HYDROcodone-acetaminophen (NORCO) 10-325 MG tablet Take 1 tablet by mouth every 6 (six) hours as needed for severe pain. 04/02/21   Mosie Lukes, MD  ipratropium (ATROVENT) 0.02 % nebulizer solution Take 2.5 mLs (0.5 mg total) by nebulization 4 (four) times daily. Patient taking differently: Take 0.5 mg by nebulization every 4 (four) hours as needed. 04/10/20   Charlesetta Shanks, MD  isosorbide mononitrate (IMDUR) 60 MG 24 hr tablet TAKE 1 TABLET BY MOUTH TWICE A DAY 11/09/20   Minna Merritts, MD  Krill Oil 500 MG CAPS Take 500 mg by mouth daily.     [provider]  levothyroxine (SYNTHROID) 75 MCG tablet Take 1 tablet (75 mcg total) by mouth daily. 01/10/21   Mosie Lukes, MD  nitroGLYCERIN (NITROSTAT) 0.4 MG SL tablet Place 1 tablet (0.4 mg total) under the tongue every 5 (five) minutes as needed for chest pain. 06/22/20   Baldwin Jamaica, PA-C  omeprazole (PRILOSEC) 20 MG capsule TAKE 1 CAPSULE BY  MOUTH ONCE DAILY 01/08/21   Mosie Lukes, MD  ondansetron (ZOFRAN) 4 MG tablet Take 1 tablet (4 mg total) by mouth every 8 (eight) hours as needed for nausea or vomiting. 03/22/21 03/22/22  Parrett, Fonnie Mu, NP  Polyethyl Glycol-Propyl Glycol (SYSTANE OP) Place 1 drop into both eyes 2 (two) times daily as needed (dry/irritated eyes.).     [provider]  potassium chloride (KLOR-CON) 10 MEQ tablet Take 1 tablet (10 mEq total) by mouth daily. And take 2 tablets (76meq) on days you take furosemide 04/26/20   Baldwin Jamaica, PA-C  predniSONE (DELTASONE) 20 MG tablet Take 1 tablet (20 mg total) by mouth daily with breakfast. 03/05/21   Parrett, Fonnie Mu, NP  rosuvastatin (CRESTOR) 40 MG tablet TAKE 1 TABLET BY MOUTH ONCE DAILY 01/09/21   Minna Merritts, MD  zinc gluconate 50 MG tablet Take 50 mg by mouth daily.    [provider]    Physical Exam: Vitals:   04/11/21 0235 04/11/21 0330 04/11/21 0430 04/11/21 0500  BP: 115/66 115/60 134/75 138/67  Pulse: 66 64 69 63  Resp: 15 (!) 24 18 17  Temp:      TempSrc:      SpO2: 97% 97% 98% 98%  Weight:      Height:        Constitutional: NAD, calm  Eyes: PERTLA, lids and conjunctivae normal ENMT: Mucous membranes are moist. Posterior pharynx clear of any exudate or lesions.   Neck: supple, no masses  Respiratory: Diminished breath sounds bilaterally. Speaking full sentences. No accessory muscle use.  Cardiovascular: Rate ~60 and irregularly irregular. No extremity edema.  Abdomen: No distension, no tenderness, soft. Bowel sounds active.  Musculoskeletal: Clubbed digits. No joint deformity upper and lower extremities.   Skin: no significant rashes, lesions, ulcers. Warm, dry, well-perfused. Neurologic: CN 2-12 grossly intact. Moving all extremities. Alert and oriented.  Psychiatric: Very pleasant. Cooperative.    Labs and Imaging on Admission: I have personally reviewed following labs and imaging studies  CBC: Recent  Labs  Lab 04/10/21 2122  WBC 12.9*  NEUTROABS 11.0*  HGB 10.5*  HCT 35.2*  MCV 76.4*  PLT 983   Basic Metabolic Panel: Recent Labs  Lab 04/10/21 2122 04/11/21 0310  NA 135 132*  K 3.8 3.5  CL 97* 96*  CO2 29 27  GLUCOSE 113* 135*  BUN 10 10  CREATININE 0.84 0.75  CALCIUM 8.5* 8.1*  MG  --  1.8   GFR: Estimated Creatinine Clearance: 42.9 mL/min (by C-G formula based on SCr of 0.75 mg/dL). Liver Function Tests: No results for input(s): AST, ALT, ALKPHOS, BILITOT, PROT, ALBUMIN in the last 168 hours. No results for input(s): LIPASE, AMYLASE in the last 168 hours. No results for input(s): AMMONIA in the last 168 hours. Coagulation Profile: No results for input(s): INR, PROTIME in the last 168 hours. Cardiac Enzymes: No results for input(s): CKTOTAL, CKMB, CKMBINDEX, TROPONINI in the last 168 hours. BNP (last 3 results) No results for input(s): PROBNP in the last 8760 hours. HbA1C: No results for input(s): HGBA1C in the last 72 hours. CBG: No results for input(s): GLUCAP in the last 168 hours. Lipid Profile: No results for input(s): CHOL, HDL, LDLCALC, TRIG, CHOLHDL, LDLDIRECT in the last 72 hours. Thyroid Function Tests: No results for input(s): TSH, T4TOTAL, FREET4, T3FREE, THYROIDAB in the last 72 hours. Anemia Panel: No results for input(s): VITAMINB12, FOLATE, FERRITIN, TIBC, IRON, RETICCTPCT in the last 72 hours. Urine analysis:    Component Value Date/Time   COLORURINE YELLOW 06/28/2018 1533   APPEARANCEUR CLEAR 06/28/2018 1533   LABSPEC 1.020 06/28/2018 1533   PHURINE 5.5 06/28/2018 1533   GLUCOSEU NEGATIVE 06/28/2018 1533   HGBUR NEGATIVE 06/28/2018 1533   BILIRUBINUR NEGATIVE 06/28/2018 1533   BILIRUBINUR negative 03/27/2015 [place] 06/28/2018 1533   PROTEINUR negative 03/27/2015 1833   PROTEINUR NEGATIVE 07/15/2014 1548   UROBILINOGEN 0.2 06/28/2018 1533   NITRITE NEGATIVE 06/28/2018 1533   LEUKOCYTESUR NEGATIVE 06/28/2018 1533    Sepsis Labs: @LABRCNTIP (procalcitonin:4,lacticidven:4) ) Recent Results (from the past 240 hour(s))  Resp Panel by RT-PCR (Flu A&B, Covid) Nasopharyngeal Swab     Status: None   Collection Time: 04/11/21  3:04 AM   Specimen: Nasopharyngeal Swab; Nasopharyngeal(NP) swabs in vial transport medium  Result Value Ref Range Status   SARS Coronavirus 2 by RT PCR NEGATIVE NEGATIVE Final    Comment: (NOTE) SARS-CoV-2 target nucleic acids are NOT DETECTED.  The SARS-CoV-2 RNA is generally detectable in upper respiratory specimens during the acute phase of infection. The lowest concentration of SARS-CoV-2 viral copies this assay can detect is 138 copies/mL. A  negative result does not preclude SARS-Cov-2 infection and should not be used as the sole basis for treatment or other patient management decisions. A negative result may occur with  improper specimen collection/handling, submission of specimen other than nasopharyngeal swab, presence of viral mutation(s) within the areas targeted by this assay, and inadequate number of viral copies(<138 copies/mL). A negative result must be combined with clinical observations, patient history, and epidemiological information. The expected result is Negative.  Fact Sheet for Patients:  EntrepreneurPulse.com.au  Fact Sheet for Healthcare Providers:  IncredibleEmployment.be  This test is no t yet approved or cleared by the [place] FDA and  has been authorized for detection and/or diagnosis of SARS-CoV-2 by FDA under an Emergency Use Authorization (EUA). This EUA will remain  in effect (meaning this test can be used) for the duration of the COVID-19 declaration under Section 564(b)(1) of the Act, 21 U.S.C.section 360bbb-3(b)(1), unless the authorization is terminated  or revoked sooner.       Influenza A by PCR NEGATIVE NEGATIVE Final   Influenza B by PCR NEGATIVE NEGATIVE Final    Comment: (NOTE) The  Xpert Xpress SARS-CoV-2/FLU/RSV plus assay is intended as an aid in the diagnosis of influenza from Nasopharyngeal swab specimens and should not be used as a sole basis for treatment. Nasal washings and aspirates are unacceptable for Xpert Xpress SARS-CoV-2/FLU/RSV testing.  Fact Sheet for Patients: EntrepreneurPulse.com.au  Fact Sheet for Healthcare Providers: IncredibleEmployment.be  This test is not yet approved or cleared by the [place] FDA and has been authorized for detection and/or diagnosis of SARS-CoV-2 by FDA under an Emergency Use Authorization (EUA). This EUA will remain in effect (meaning this test can be used) for the duration of the COVID-19 declaration under Section 564(b)(1) of the Act, 21 U.S.C. section 360bbb-3(b)(1), unless the authorization is terminated or revoked.  Performed at [facility], [place] [address], [place], [place] [address]      Radiological Exams on Admission: CT Angio Chest PE W and/or Wo Contrast  Result Date: 04/11/2021 CLINICAL DATA:  Concern for pulmonary embolism.  Left lung mass. EXAM: CT ANGIOGRAPHY CHEST WITH CONTRAST TECHNIQUE: Multidetector CT imaging of the chest was performed using the standard protocol during bolus administration of intravenous contrast. Multiplanar CT image reconstructions and MIPs were obtained to evaluate the vascular anatomy. CONTRAST:  8ml of Omni 350 COMPARISON:  Chest radiograph dated 04/10/2021 and head CT dated 03/21/2021 FINDINGS: Cardiovascular: There is cardiomegaly. No pericardial effusion. There is coronary vascular calcification and postsurgical changes of CABG. Retrograde flow of contrast from the right atrium into the IVC suggestive of right heart dysfunction. Moderate atherosclerotic calcification of the thoracic aorta. There is linear nearly occlusive thrombus in the left upper lobe pulmonary artery branch. This may represent a bland thrombus versus tumor  thrombus. No CT evidence of right heart stranding. Mediastinum/Nodes: Evaluation of the left hilum is limited due to consolidative changes of the left upper lobe. No mediastinal fluid collection. Lungs/Pleura: Large area of consolidation in the left upper lobe as seen on the PET-CT in keeping with known malignancy with associated lymphangitic spread in the left upper lobe. There is background of emphysema. A 1 cm right upper lobe subpleural nodule. No significant pleural effusion. No pneumothorax. The central airways are patent. Upper Abdomen: Small hiatal hernia. Musculoskeletal: A 17 mm lytic lesion involving the posterior right ninth rib with associated pathologic fracture. Median sternotomy wires. Review of the MIP images confirms the above findings. IMPRESSION: 1. Left upper lobe  bland thrombus versus tumor thrombus. No CT evidence of right heart stranding. 2. Large area of consolidation in the left upper lobe in keeping with known malignancy with associated lymphangitic spread in the left upper lobe. 3. Right posterior ninth rib lytic lesion with associated pathologic fracture. 4. Cardiomegaly with evidence of right heart dysfunction. 5. A 1 cm right upper lobe subpleural nodule. 6. Aortic Atherosclerosis (ICD10-I70.0) and Emphysema (ICD10-J43.9). These results were called by telephone at the time of interpretation on 04/11/2021 at 1:28 am to Dr. Sedonia Small, who verbally acknowledged these results. Electronically Signed   By: Anner Crete M.D.   On: 04/11/2021 01:31   DG Chest Port 1 View  Result Date: 04/10/2021 CLINICAL DATA:  Chest pain and hemoptysis. EXAM: PORTABLE CHEST 1 VIEW COMPARISON:  Chest x-ray 03/05/2021.  CT chest 03/07/2021. FINDINGS: Focal masslike opacity measuring 10 cm in the lateral left upper lobe has increased in size. There is increasing diffuse airspace disease and central interstitial opacities. Right lung is clear. There is no pleural effusion or pneumothorax. Again seen are  postsurgical changes in the left chest. Sternotomy wires are present. Cardiomediastinal silhouette is stable, the heart is enlarged. No acute fractures are identified. IMPRESSION: 1. Enlarging left upper lobe mass. 2. New/increasing diffuse left lung airspace disease with interstitial prominence. Findings may related to infection, pneumonia and/or tumor progression. Electronically Signed   By: Ronney Asters M.D.   On: 04/10/2021 21:42    EKG: Independently reviewed. Appears to be atrial fibrillation, interpretation limited by artifact.   Assessment/Plan   1. Pulmonary embolism  - Presents with acute-onset severe pain at right lower posterolateral chest wall and is found to have pathologic rib fracture and bland thrombus or tumor thrombus involving LUL pulm art branch despite Eliquis 2.5 BID  - She is hemodynamically stable, BNP is elevated but lower than priors, troponin is normal   - Start Lovenox 1 mg/kg q12h, check echocardiogram and LE venous dopplers    2. Lung mass; lytic rib lesion with pathologic fracture   - Following with pulmonology for LUL mass with plan for biopsy 04/15/21; has lytic lesion involving right 9th rib with pathologic fracture   - Her primary complaint is pain related to the rib fracture  - Continue pain-control, pulmonology follow-up as planned    3. COPD; chronic hypoxic respiratory failure   - Not in exacerbation on admission  - Continue 2 Lpm supplemental O2, Trelegy, albuterol    4. Atrial fibrillation  - Continue Tikosyn and diltiazem  - Was on Eliquis, starting Lovenox now in light of lung mass and PE   5. Chronic diastolic CHF  - Appears compensated; EF was 55-60% a year ago  - Updating echo in setting of PE   6. No anginal complaints  - She is holding Plavix for planned lung biopsy  - Continue Crestor, Imdur    DVT prophylaxis: Lovenox 1 mg/kg q12h  Code Status: DNR, discussed with patient on admission  Level of Care: Level of care: Telemetry  Cardiac Family Communication: none present  Disposition Plan:  Patient is from: Home  Anticipated d/c is to: TBD Anticipated d/c date is: 11/18 or 04/13/21 Patient currently: Pending echo, LE venous dopplers, pain-control with oral medications  Consults called: none  Admission status: Observation     Vianne Bulls, MD [facility]  04/11/2021, 5:25 AM

## 2021-04-11 NOTE — Progress Notes (Addendum)
PROGRESS NOTE    Andrea [name]  HDQ:[id number] DOB: Jul 20, 1940 DOA: 04/10/2021 PCP: Mosie Lukes, MD    Brief Narrative:  Andrea [name] was admitted to the hospital with the working diagnosis of acute pulmonary embolism.   80 yo female with the past medical history of COPD, with chronic hypoxemic respiratory failure, OSA, atrial fibrillation, CAD, chronic diastolic heart failure and hypothyroidism, who presented with right sided chest pain.  Patient reported coughing for the last 24 weeks, she has been diagnosed with left upper lobe mass and lytic lesion on the posterior right ninth rib, planned biopsy per pulmonary on 04/15/21.  On 11/16 night she acute onset of severe pain on the right posterior rib cage, worse with coughing and severe enough that she came to the hospital for further evaluation.  On her initial physical examination her blood pressure was 115/66, HR 66 and RR24, oxygen saturation 97% on supplemental 0-2 per [place] 2 L/min.  Her lungs had decreased breath sounds bilaterally but no wheezing, heart S1 and S2 present and rhythmic, abdomen was soft and non tender and no lower extremity edema.   NA 132, K 3,8, Cl 97, bicarbonate 29, glucose 113, BUN 10 and cr 0,84 Wbc 12,9, hgb 10,5, hct 35,2 and plt 331. SARS COVID 19 negative  Chest film with large mass on peripheral left upper lobe. Positive interstitial infiltrates left upper lobe CT chest  left upper lobe with bland thrombus versus tumor thrombus. No right heart stranding.  Large area of consolidation in the left upper lobe with lymphangitic spread.  Right posterior ninth rib lesion with pathologic fracture.  1 cm right upper lobe subpleural nodule.   EKG 88 bpm, right ward axis, normal intervals, sinus rhythm with positive PAC, no significant st segment, negative T wave at the II, III, and Avf.   Assessment & Plan:   Principal Problem:   Pulmonary embolism (HCC) Active Problems:   CAD (coronary artery disease)    Hypothyroidism   Chronic respiratory failure with hypoxia (HCC)   Atrial fibrillation, chronic (HCC)   COPD (chronic obstructive pulmonary disease) (HCC)   Chronic diastolic CHF (congestive heart failure) (HCC)   Lung mass   Pathological fracture of one rib   Microcytic anemia   Acute pulmonary embolism (positive tumor thrombus), in the setting of large left upper lobe mass, complicated with pathologic 9th rib fracture.  Patient with significant chest wall pain at the right lower rib cage, associated with dyspnea and decreased mobility.  Her oxymetry has been 90 to 96% on 2 L/min per [place].   Plan to continue pain control with hydromorphone and oxycodone. Scheduled acetaminophen.   Old records personally reviewed, patient under work up for left upper lobe mass, plan for transthoracic biopsy as first step. If negative results 2nd step for bronchoscopy.  Plan to consult IR for biopsy and will let pulmonary team know.  Continue with supplemental 02 per [place] to keep oxygen saturation 88% or greater.  Will change enoxaparin to heparin in preparation for biopsy.  Follow up on echocardiogram.  No clinical signs of infection, will hold on antibiotic therapy for now.   2. COPD no signs of clinical exacerbation, continue with bronchodilator therapy.   3. Paroxysmal atrial fibrillation. Patient on sinus rhythm, will continue telemetry monitoring, continue with defetilide and diltiazem for rate control.  Anticoagulation with heparin   4. Chronic diastolic heart failure. No exacerbation, continue blood pressure monitoring.   5. CAD/ dyslipidemia. Continue to hold on clopidogrel and continue  with isosorbide and statin   6. Hypothyroid, continue with levothyroxine  Patient continue to be at high risk for worsening pain   Status is: Observation    DVT prophylaxis:  Heparin   Code Status:   DNR   Family Communication:   No family at the bedside    Consultants:  IR    Subjective: Patient  with no nausea or vomiting, continue to have severe pain on the right chest wall, 10/10 in intensity, worse with movement and deep inspiration.   Objective: Vitals:   04/11/21 0815 04/11/21 0830 04/11/21 1015 04/11/21 1200  BP: 135/74 (!) 139/118 (!) 146/37 (!) 124/57  Pulse: 71 76 73 73  Resp: 20 17 17  (!) 22  Temp:      TempSrc:      SpO2: 98% 98% 98% 95%  Weight:      Height:        Intake/Output Summary (Last 24 hours) at 04/11/2021 1302 Last data filed at 04/11/2021 0302 Gross per 24 hour  Intake 10 ml  Output --  Net 10 ml   Filed Weights   04/10/21 2124  Weight: 56.7 kg    Examination:   General: in pain, deconditioned and ill looking appearing  Neurology: Awake and alert, non focal  E ENT: positive pallor, no icterus, oral mucosa moist Cardiovascular: No JVD. S1-S2 present, rhythmic, no gallops, rubs, or murmurs. No lower extremity edema. Pulmonary: positive breath sounds bilaterally, with no wheezing, rhonchi or rales. Decreased breath sounds on the left upper lobe.  Gastrointestinal. Abdomen soft and non tender Skin. No rashes Musculoskeletal: no joint deformities     Data Reviewed: I have personally reviewed following labs and imaging studies  CBC: Recent Labs  Lab 04/10/21 2122  WBC 12.9*  NEUTROABS 11.0*  HGB 10.5*  HCT 35.2*  MCV 76.4*  PLT 767   Basic Metabolic Panel: Recent Labs  Lab 04/10/21 2122 04/11/21 0310  NA 135 132*  K 3.8 3.5  CL 97* 96*  CO2 29 27  GLUCOSE 113* 135*  BUN 10 10  CREATININE 0.84 0.75  CALCIUM 8.5* 8.1*  MG  --  1.8   GFR: Estimated Creatinine Clearance: 42.9 mL/min (by C-G formula based on SCr of 0.75 mg/dL). Liver Function Tests: No results for input(s): AST, ALT, ALKPHOS, BILITOT, PROT, ALBUMIN in the last 168 hours. No results for input(s): LIPASE, AMYLASE in the last 168 hours. No results for input(s): AMMONIA in the last 168 hours. Coagulation Profile: No results for input(s): INR, PROTIME in the  last 168 hours. Cardiac Enzymes: No results for input(s): CKTOTAL, CKMB, CKMBINDEX, TROPONINI in the last 168 hours. BNP (last 3 results) No results for input(s): PROBNP in the last 8760 hours. HbA1C: No results for input(s): HGBA1C in the last 72 hours. CBG: No results for input(s): GLUCAP in the last 168 hours. Lipid Profile: No results for input(s): CHOL, HDL, LDLCALC, TRIG, CHOLHDL, LDLDIRECT in the last 72 hours. Thyroid Function Tests: No results for input(s): TSH, T4TOTAL, FREET4, T3FREE, THYROIDAB in the last 72 hours. Anemia Panel: No results for input(s): VITAMINB12, FOLATE, FERRITIN, TIBC, IRON, RETICCTPCT in the last 72 hours.    Radiology Studies: I have reviewed all of the imaging during this hospital visit personally     Scheduled Meds:  diltiazem  240 mg Oral Daily   dofetilide  125 mcg Oral BID   enoxaparin (LOVENOX) injection  55 mg Subcutaneous Q12H   FLUoxetine  40 mg Oral Daily   fluticasone  furoate-vilanterol  1 puff Inhalation Daily   And   umeclidinium bromide  1 puff Inhalation Daily   isosorbide mononitrate  60 mg Oral BID   levothyroxine  75 mcg Oral Daily   pantoprazole  40 mg Oral Daily   rosuvastatin  40 mg Oral Daily   senna  1 tablet Oral BID   sodium chloride flush  3 mL Intravenous Q12H   Continuous Infusions:   LOS: 0 days        Maksim Peregoy Gerome Apley, MD

## 2021-04-11 NOTE — ED Notes (Signed)
Heparin verbal order per MD Sedonia Small and verified with pharmacy.

## 2021-04-11 NOTE — ED Notes (Signed)
Pt to ct 

## 2021-04-11 NOTE — Progress Notes (Signed)
Also contact pharmacist and advised them of PTT critical value. Advised by pharmacy that she will look at chart and return call.

## 2021-04-11 NOTE — Progress Notes (Signed)
Paged Dr. Cathlean Sauer.  PTT >200. Received result from lab, [facility].

## 2021-04-11 NOTE — Progress Notes (Signed)
  Echocardiogram 2D Echocardiogram has been performed.  Darlina Sicilian M 04/11/2021, 11:15 AM

## 2021-04-11 NOTE — Progress Notes (Signed)
VASCULAR LAB    Bilateral lower extremity venous duplex has been performed.  See CV proc for preliminary results.   Satomi Buda, RVT 04/11/2021, 11:50 AM

## 2021-04-11 NOTE — Progress Notes (Signed)
ANTICOAGULATION CONSULT NOTE - Initial Consult  Pharmacy Consult for Heparin -> Lovenox Indication: pulmonary embolus  Allergies  Allergen Reactions   Erythromycin Other (See Comments)    Made the patient's tongue "burn"   Tape Rash and Other (See Comments)    PLEASE USE PAPER TAPE!!   Meperidine Nausea And Vomiting   Shellfish Allergy Nausea And Vomiting   Ciprofloxacin Rash and Other (See Comments)    Rash from IV   Codeine Nausea And Vomiting   Latex Rash and Other (See Comments)    Made the patient's skin "burn," also   Penicillins Rash and Other (See Comments)    Has patient had a PCN reaction causing immediate rash, facial/tongue/throat swelling, SOB or lightheadedness with hypotension: Unk Has patient had a PCN reaction causing severe rash involving mucus membranes or skin necrosis: No Has patient had a PCN reaction that required hospitalization: No Has patient had a PCN reaction occurring within the last 10 years: No If all of the above answers are "NO", then may proceed with Cephalosporin use.     Patient Measurements: Height: 5\' 2"  (157.5 cm) Weight: 56.7 kg (125 lb) IBW/kg (Calculated) : 50.1 Heparin Dosing Weight: 56 kg  Vital Signs: BP: 124/57 (11/17 1200) Pulse Rate: 73 (11/17 1200)  Labs: Recent Labs    04/10/21 2122 04/11/21 0310  HGB 10.5*  --   HCT 35.2*  --   PLT 331  --   CREATININE 0.84 0.75  TROPONINIHS 10  --      Estimated Creatinine Clearance: 42.9 mL/min (by C-G formula based on SCr of 0.75 mg/dL).   Medical History: Past Medical History:  Diagnosis Date   AAA (abdominal aortic aneurysm) 01/2009   AAA (2.8 x 3.0)  moderate RAS (left); 2.7 x 2.7 cm (07/11/05)   Acute bronchitis 03/20/2013; 2017   Angina    Anosmia    Asthma    Baker's cyst of knee 01/30/2014   Basal cell carcinoma    "back and left arm"   Bradycardia    Metoprolol stopped 08/2011   CAD (coronary artery disease)    s/CABG (reports IMA and 2 SVGs) back in 1999   Myoview normal 3/10; s/p PCI with DES to PL branch of distal RCA 09/2011; PCI +DES to SVG-RCA, PCI + DES to mid LCx 12/2015   Cerebrovascular disease 01/2009   carotid u/s  R 0-39%   L 60-79%   Chronic thoracic back pain    COPD (chronic obstructive pulmonary disease) (HCC)    Depression    Dizziness    Dysrhythmia    hx of sinus brady   GERD (gastroesophageal reflux disease)    Heart murmur    Herniated lumbar disc without myelopathy    History of blood transfusion 1999   "when I had the bypass; had a PE"   Hyperglycemia 10/23/2015   Hyperlipidemia    Hypertension    Hypothyroidism 09/30/2016   Medicare annual wellness visit, subsequent 07/31/2013   NSTEMI (non-ST elevated myocardial infarction) ([place]) 11/12   Cath showed atretic IMA graft to the LAD, SVG to PD was patent but the continuation of this graft to the PL branch was occluded; there were L to R collaterals; Mid LAD had a 60 to 70% stenosis. She has been treated medically.  Neg Myoview 05/2011   Osteoarthritis of back    Osteoporosis    Pneumonia "several times"   Pulmonary embolism ([place]) 1999   "after my bypass"   PVD (peripheral vascular disease) (  [place])    Shortness of breath    Squamous carcinoma    "nose"   Thyroid disease    Hypothyroid   Unstable angina ([place]) 09/25/2015    Medications:  See electronic med rec  Assessment: 80 y.o. F presents with SOB and coughing up blood. Found to have PE on CT scan. Noted pt was on Eliquis PTA for afib - last dose was 11/15 pm - pt was holding Eliquis for planned biopsy on 11/17.   Heparin initiated at 1000 units/hr but switched this AM to enoxaparin given PE treatment in active cancer patient. Decision made to switch back to heparin at this time in preparation for biopsy.  Baseline aPTT/Heparin level not initially collected.Given time since last DOAC and receipt of enoxaparin this morning will collect baseline coags. Will need to utilize PTT monitoring.  Goal of Therapy:  Heparin  level 0.3-0.7 units/ml, aPTT 66-102 sec Monitor platelets by anticoagulation protocol: Yes   Plan: No initial heparin bolus Start heparin gtt at 1000 units/hr Will f/u heparin level and aPTT in 8 hours Daily heparin level and CBC  Lorelei Pont, PharmD, BCPS 04/11/2021 2:24 PM ED Clinical Pharmacist -  [phone]

## 2021-04-12 DIAGNOSIS — I959 Hypotension, unspecified: Secondary | ICD-10-CM | POA: Diagnosis not present

## 2021-04-12 DIAGNOSIS — J9621 Acute and chronic respiratory failure with hypoxia: Secondary | ICD-10-CM | POA: Diagnosis present

## 2021-04-12 DIAGNOSIS — D630 Anemia in neoplastic disease: Secondary | ICD-10-CM | POA: Diagnosis present

## 2021-04-12 DIAGNOSIS — M8448XA Pathological fracture, other site, initial encounter for fracture: Secondary | ICD-10-CM | POA: Diagnosis not present

## 2021-04-12 DIAGNOSIS — J439 Emphysema, unspecified: Secondary | ICD-10-CM | POA: Diagnosis present

## 2021-04-12 DIAGNOSIS — Z20822 Contact with and (suspected) exposure to covid-19: Secondary | ICD-10-CM | POA: Diagnosis present

## 2021-04-12 DIAGNOSIS — I739 Peripheral vascular disease, unspecified: Secondary | ICD-10-CM | POA: Diagnosis present

## 2021-04-12 DIAGNOSIS — I2699 Other pulmonary embolism without acute cor pulmonale: Secondary | ICD-10-CM | POA: Diagnosis present

## 2021-04-12 DIAGNOSIS — I482 Chronic atrial fibrillation, unspecified: Secondary | ICD-10-CM | POA: Diagnosis not present

## 2021-04-12 DIAGNOSIS — I25708 Atherosclerosis of coronary artery bypass graft(s), unspecified, with other forms of angina pectoris: Secondary | ICD-10-CM | POA: Diagnosis not present

## 2021-04-12 DIAGNOSIS — J9611 Chronic respiratory failure with hypoxia: Secondary | ICD-10-CM | POA: Diagnosis not present

## 2021-04-12 DIAGNOSIS — M8458XA Pathological fracture in neoplastic disease, other specified site, initial encounter for fracture: Secondary | ICD-10-CM | POA: Diagnosis present

## 2021-04-12 DIAGNOSIS — B370 Candidal stomatitis: Secondary | ICD-10-CM | POA: Diagnosis present

## 2021-04-12 DIAGNOSIS — C7951 Secondary malignant neoplasm of bone: Secondary | ICD-10-CM | POA: Diagnosis present

## 2021-04-12 DIAGNOSIS — I110 Hypertensive heart disease with heart failure: Secondary | ICD-10-CM | POA: Diagnosis present

## 2021-04-12 DIAGNOSIS — J9601 Acute respiratory failure with hypoxia: Secondary | ICD-10-CM | POA: Diagnosis not present

## 2021-04-12 DIAGNOSIS — C3412 Malignant neoplasm of upper lobe, left bronchus or lung: Secondary | ICD-10-CM | POA: Diagnosis present

## 2021-04-12 DIAGNOSIS — Z7189 Other specified counseling: Secondary | ICD-10-CM | POA: Diagnosis not present

## 2021-04-12 DIAGNOSIS — E039 Hypothyroidism, unspecified: Secondary | ICD-10-CM | POA: Diagnosis present

## 2021-04-12 DIAGNOSIS — R918 Other nonspecific abnormal finding of lung field: Secondary | ICD-10-CM | POA: Diagnosis not present

## 2021-04-12 DIAGNOSIS — E440 Moderate protein-calorie malnutrition: Secondary | ICD-10-CM | POA: Diagnosis present

## 2021-04-12 DIAGNOSIS — I5032 Chronic diastolic (congestive) heart failure: Secondary | ICD-10-CM | POA: Diagnosis not present

## 2021-04-12 DIAGNOSIS — R042 Hemoptysis: Secondary | ICD-10-CM | POA: Diagnosis present

## 2021-04-12 DIAGNOSIS — R64 Cachexia: Secondary | ICD-10-CM | POA: Diagnosis present

## 2021-04-12 DIAGNOSIS — Z66 Do not resuscitate: Secondary | ICD-10-CM | POA: Diagnosis present

## 2021-04-12 DIAGNOSIS — F32A Depression, unspecified: Secondary | ICD-10-CM | POA: Diagnosis present

## 2021-04-12 DIAGNOSIS — C3492 Malignant neoplasm of unspecified part of left bronchus or lung: Secondary | ICD-10-CM | POA: Diagnosis not present

## 2021-04-12 DIAGNOSIS — J90 Pleural effusion, not elsewhere classified: Secondary | ICD-10-CM | POA: Diagnosis not present

## 2021-04-12 DIAGNOSIS — D509 Iron deficiency anemia, unspecified: Secondary | ICD-10-CM | POA: Diagnosis present

## 2021-04-12 DIAGNOSIS — E871 Hypo-osmolality and hyponatremia: Secondary | ICD-10-CM | POA: Diagnosis not present

## 2021-04-12 DIAGNOSIS — J189 Pneumonia, unspecified organism: Secondary | ICD-10-CM | POA: Diagnosis present

## 2021-04-12 DIAGNOSIS — J449 Chronic obstructive pulmonary disease, unspecified: Secondary | ICD-10-CM | POA: Diagnosis not present

## 2021-04-12 DIAGNOSIS — Z515 Encounter for palliative care: Secondary | ICD-10-CM | POA: Diagnosis not present

## 2021-04-12 LAB — BASIC METABOLIC PANEL
Anion gap: 7 (ref 5–15)
BUN: 10 mg/dL (ref 8–23)
CO2: 29 mmol/L (ref 22–32)
Calcium: 8.3 mg/dL — ABNORMAL LOW (ref 8.9–10.3)
Chloride: 96 mmol/L — ABNORMAL LOW (ref 98–111)
Creatinine, Ser: 0.9 mg/dL (ref 0.44–1.00)
GFR, Estimated: >60 mL/min (ref >60)
Glucose, Bld: 105 mg/dL — ABNORMAL HIGH (ref 70–99)
Potassium: 4.5 mmol/L (ref 3.5–5.1)
Sodium: 132 mmol/L — ABNORMAL LOW (ref 135–145)

## 2021-04-12 LAB — HEPARIN LEVEL (UNFRACTIONATED): Heparin Unfractionated: 0.36 IU/mL (ref 0.30–0.70)

## 2021-04-12 LAB — APTT
aPTT: 52 seconds — ABNORMAL HIGH (ref 24–36)
aPTT: 61 seconds — ABNORMAL HIGH (ref 24–36)

## 2021-04-12 LAB — CBC
HCT: 30.5 % — ABNORMAL LOW (ref 36.0–46.0)
Hemoglobin: 9.2 g/dL — ABNORMAL LOW (ref 12.0–15.0)
MCH: 22.9 pg — ABNORMAL LOW (ref 26.0–34.0)
MCHC: 30.2 g/dL (ref 30.0–36.0)
MCV: 75.9 fL — ABNORMAL LOW (ref 80.0–100.0)
Platelets: 253 10*3/uL (ref 150–400)
RBC: 4.02 MIL/uL (ref 3.87–5.11)
RDW: 18.5 % — ABNORMAL HIGH (ref 11.5–15.5)
WBC: 11 10*3/uL — ABNORMAL HIGH (ref 4.0–10.5)
nRBC: 0 % (ref 0.0–0.2)

## 2021-04-12 MED ORDER — BOOST / RESOURCE BREEZE PO LIQD CUSTOM
1.0000 | Freq: Three times a day (TID) | ORAL | Status: DC
  Administered 2021-04-12 – 2021-04-18 (×5): 1 via ORAL

## 2021-04-12 MED ORDER — LACTATED RINGERS IV BOLUS
500.0000 mL | Freq: Once | INTRAVENOUS | Status: AC
  Administered 2021-04-12: 500 mL via INTRAVENOUS

## 2021-04-12 MED ORDER — ADULT MULTIVITAMIN W/MINERALS CH
1.0000 | ORAL_TABLET | Freq: Every day | ORAL | Status: DC
  Administered 2021-04-12 – 2021-04-24 (×13): 1 via ORAL
  Filled 2021-04-12 (×13): qty 1

## 2021-04-12 MED ORDER — CYCLOBENZAPRINE HCL 10 MG PO TABS
5.0000 mg | ORAL_TABLET | Freq: Three times a day (TID) | ORAL | Status: DC
  Administered 2021-04-12 – 2021-04-25 (×41): 5 mg via ORAL
  Filled 2021-04-12 (×40): qty 1

## 2021-04-12 NOTE — Progress Notes (Signed)
[facility] for Heparin Indication: pulmonary embolus  Allergies  Allergen Reactions   Erythromycin Other (See Comments)    Made the patient's tongue "burn"   Tape Rash and Other (See Comments)    PLEASE USE PAPER TAPE!!   Meperidine Nausea And Vomiting   Shellfish Allergy Nausea And Vomiting   Ciprofloxacin Rash and Other (See Comments)    Rash from IV   Codeine Nausea And Vomiting   Latex Rash and Other (See Comments)    Made the patient's skin "burn," also   Penicillins Rash and Other (See Comments)    Has patient had a PCN reaction causing immediate rash, facial/tongue/throat swelling, SOB or lightheadedness with hypotension: Unk Has patient had a PCN reaction causing severe rash involving mucus membranes or skin necrosis: No Has patient had a PCN reaction that required hospitalization: No Has patient had a PCN reaction occurring within the last 10 years: No If all of the above answers are "NO", then may proceed with Cephalosporin use.     Patient Measurements: Height: 5\' 2"  (157.5 cm) Weight: 59.6 kg (131 lb 6.3 oz) IBW/kg (Calculated) : 50.1 Heparin Dosing Weight: 56 kg  Vital Signs: Temp: 98.3 F (36.8 C) (11/18 0748) Temp Source: Oral (11/18 0748) BP: 125/50 (11/18 0748) Pulse Rate: 74 (11/18 0748)  Labs: Recent Labs    04/10/21 2122 04/11/21 0310 04/11/21 1616 04/11/21 2248 04/12/21 0418  HGB 10.5*  --   --   --  9.2*  HCT 35.2*  --   --   --  30.5*  PLT 331  --   --   --  253  APTT  --   --  >200* 57*  --   HEPARINUNFRC  --   --  >1.10* 0.49  --   CREATININE 0.84 0.75  --   --  0.90  TROPONINIHS 10  --   --   --   --      Estimated Creatinine Clearance: 38.1 mL/min (by C-G formula based on SCr of 0.9 mg/dL).   Medical History: Past Medical History:  Diagnosis Date   AAA (abdominal aortic aneurysm) 01/2009   AAA (2.8 x 3.0)  moderate RAS (left); 2.7 x 2.7 cm (07/11/05)   Acute bronchitis 03/20/2013; 2017    Angina    Anosmia    Asthma    Baker's cyst of knee 01/30/2014   Basal cell carcinoma    "back and left arm"   Bradycardia    Metoprolol stopped 08/2011   CAD (coronary artery disease)    s/CABG (reports IMA and 2 SVGs) back in 1999  Myoview normal 3/10; s/p PCI with DES to PL branch of distal RCA 09/2011; PCI +DES to SVG-RCA, PCI + DES to mid LCx 12/2015   Cerebrovascular disease 01/2009   carotid u/s  R 0-39%   L 60-79%   Chronic thoracic back pain    COPD (chronic obstructive pulmonary disease) (HCC)    Depression    Dizziness    Dysrhythmia    hx of sinus brady   GERD (gastroesophageal reflux disease)    Heart murmur    Herniated lumbar disc without myelopathy    History of blood transfusion 1999   "when I had the bypass; had a PE"   Hyperglycemia 10/23/2015   Hyperlipidemia    Hypertension    Hypothyroidism 09/30/2016   Medicare annual wellness visit, subsequent 07/31/2013   NSTEMI (non-ST elevated myocardial infarction) [facility]) 11/12   Cath  showed atretic IMA graft to the LAD, SVG to PD was patent but the continuation of this graft to the PL branch was occluded; there were L to R collaterals; Mid LAD had a 60 to 70% stenosis. She has been treated medically.  Neg Myoview 05/2011   Osteoarthritis of back    Osteoporosis    Pneumonia "several times"   Pulmonary embolism ([place]) 1999   "after my bypass"   PVD (peripheral vascular disease) ([place])    Shortness of breath    Squamous carcinoma    "nose"   Thyroid disease    Hypothyroid   Unstable angina ([place]) 09/25/2015    Medications:  See electronic med rec  Assessment: 80 y.o. F presents with SOB and coughing up blood. Found to have PE on CT scan. Noted pt was on Eliquis PTA for afib - last dose was 11/15 pm - pt was holding Eliquis for planned biopsy on 11/17.   Heparin at 1100 units/hr. Will continue on heparin in preparation for biopsy. Aptt still low at 52s. No bleeding issues noted. Hgb down slightly to 9.2.   Goal of Therapy:   Heparin level 0.3-0.7 units/ml, aPTT 66-102 sec Monitor platelets by anticoagulation protocol: Yes   Plan: Increase heparin to 1250 units/hr Re-check aPTT in 8 hours  Erin Hearing PharmD., BCPS Clinical Pharmacist 04/12/2021 7:52 AM

## 2021-04-12 NOTE — Progress Notes (Signed)
[facility] for Heparin Indication: pulmonary embolus  Allergies  Allergen Reactions   Erythromycin Other (See Comments)    Made the patient's tongue "burn"   Tape Rash and Other (See Comments)    PLEASE USE PAPER TAPE!!   Meperidine Nausea And Vomiting   Shellfish Allergy Nausea And Vomiting   Ciprofloxacin Rash and Other (See Comments)    Rash from IV   Codeine Nausea And Vomiting   Latex Rash and Other (See Comments)    Made the patient's skin "burn," also   Penicillins Rash and Other (See Comments)    Has patient had a PCN reaction causing immediate rash, facial/tongue/throat swelling, SOB or lightheadedness with hypotension: Unk Has patient had a PCN reaction causing severe rash involving mucus membranes or skin necrosis: No Has patient had a PCN reaction that required hospitalization: No Has patient had a PCN reaction occurring within the last 10 years: No If all of the above answers are "NO", then may proceed with Cephalosporin use.     Patient Measurements: Height: 5\' 2"  (157.5 cm) Weight: 59.6 kg (131 lb 6.3 oz) IBW/kg (Calculated) : 50.1 Heparin Dosing Weight: 56 kg  Vital Signs: Temp: 99.1 F (37.3 C) (11/18 1932) Temp Source: Oral (11/18 1932) BP: 102/59 (11/18 2329) Pulse Rate: 70 (11/18 2329)  Labs: Recent Labs    04/10/21 2122 04/11/21 0310 04/11/21 1616 04/11/21 1616 04/11/21 2248 04/12/21 0418 04/12/21 0810 04/12/21 2236  HGB 10.5*  --   --   --   --  9.2*  --   --   HCT 35.2*  --   --   --   --  30.5*  --   --   PLT 331  --   --   --   --  253  --   --   APTT  --   --  >200*   < > 57*  --  52* 61*  HEPARINUNFRC  --   --  >1.10*  --  0.49  --  0.36  --   CREATININE 0.84 0.75  --   --   --  0.90  --   --   TROPONINIHS 10  --   --   --   --   --   --   --    < > = values in this interval not displayed.     Estimated Creatinine Clearance: 38.1 mL/min (by C-G formula based on SCr of 0.9 mg/dL).   Medical  History: Past Medical History:  Diagnosis Date   AAA (abdominal aortic aneurysm) 01/2009   AAA (2.8 x 3.0)  moderate RAS (left); 2.7 x 2.7 cm (07/11/05)   Acute bronchitis 03/20/2013; 2017   Angina    Anosmia    Asthma    Baker's cyst of knee 01/30/2014   Basal cell carcinoma    "back and left arm"   Bradycardia    Metoprolol stopped 08/2011   CAD (coronary artery disease)    s/CABG (reports IMA and 2 SVGs) back in 1999  Myoview normal 3/10; s/p PCI with DES to PL branch of distal RCA 09/2011; PCI +DES to SVG-RCA, PCI + DES to mid LCx 12/2015   Cerebrovascular disease 01/2009   carotid u/s  R 0-39%   L 60-79%   Chronic thoracic back pain    COPD (chronic obstructive pulmonary disease) (HCC)    Depression    Dizziness    Dysrhythmia    hx of  sinus brady   GERD (gastroesophageal reflux disease)    Heart murmur    Herniated lumbar disc without myelopathy    History of blood transfusion 1999   "when I had the bypass; had a PE"   Hyperglycemia 10/23/2015   Hyperlipidemia    Hypertension    Hypothyroidism 09/30/2016   Medicare annual wellness visit, subsequent 07/31/2013   NSTEMI (non-ST elevated myocardial infarction) ([place]) 11/12   Cath showed atretic IMA graft to the LAD, SVG to PD was patent but the continuation of this graft to the PL branch was occluded; there were L to R collaterals; Mid LAD had a 60 to 70% stenosis. She has been treated medically.  Neg Myoview 05/2011   Osteoarthritis of back    Osteoporosis    Pneumonia "several times"   Pulmonary embolism ([place]) 1999   "after my bypass"   PVD (peripheral vascular disease) ([place])    Shortness of breath    Squamous carcinoma    "nose"   Thyroid disease    Hypothyroid   Unstable angina ([place]) 09/25/2015    Medications:  See electronic med rec  Assessment: 80 y.o. F presents with SOB and coughing up blood. Found to have PE on CT scan. Noted pt was on Eliquis PTA for afib - last dose was 11/15 pm - pt was holding Eliquis for planned  biopsy on 11/17.   Heparin initiated at 1000 units/hr but switched this AM to enoxaparin given PE treatment in active cancer patient. Decision made to switch back to heparin at this time in preparation for biopsy.  Baseline aPTT/Heparin level not initially collected.Given time since last DOAC and receipt of enoxaparin this morning will collect baseline coags. Will need to utilize PTT monitoring.  11/18 PM update: aPTT sub-therapeutic   Goal of Therapy:  Heparin level 0.3-0.7 units/ml, aPTT 66-102 sec Monitor platelets by anticoagulation protocol: Yes   Plan: Inc heparin to 1350 units/hr Re-check heparin level and aPTT in 8 hours  Narda Bonds, PharmD, BCPS Clinical Pharmacist Phone: [phone]

## 2021-04-12 NOTE — Progress Notes (Signed)
[place] for Heparin Indication: pulmonary embolus  Allergies  Allergen Reactions   Erythromycin Other (See Comments)    Made the patient's tongue "burn"   Tape Rash and Other (See Comments)    PLEASE USE PAPER TAPE!!   Meperidine Nausea And Vomiting   Shellfish Allergy Nausea And Vomiting   Ciprofloxacin Rash and Other (See Comments)    Rash from IV   Codeine Nausea And Vomiting   Latex Rash and Other (See Comments)    Made the patient's skin "burn," also   Penicillins Rash and Other (See Comments)    Has patient had a PCN reaction causing immediate rash, facial/tongue/throat swelling, SOB or lightheadedness with hypotension: Unk Has patient had a PCN reaction causing severe rash involving mucus membranes or skin necrosis: No Has patient had a PCN reaction that required hospitalization: No Has patient had a PCN reaction occurring within the last 10 years: No If all of the above answers are "NO", then may proceed with Cephalosporin use.     Patient Measurements: Height: 5\' 2"  (157.5 cm) Weight: 56.7 kg (125 lb) IBW/kg (Calculated) : 50.1 Heparin Dosing Weight: 56 kg  Vital Signs: Temp: 98.7 F (37.1 C) (11/17 1944) Temp Source: Oral (11/17 1944) BP: 120/59 (11/17 1944) Pulse Rate: 71 (11/17 1944)  Labs: Recent Labs    04/10/21 2122 04/11/21 0310 04/11/21 1616 04/11/21 2248  HGB 10.5*  --   --   --   HCT 35.2*  --   --   --   PLT 331  --   --   --   APTT  --   --  >200* 57*  HEPARINUNFRC  --   --  >1.10* 0.49  CREATININE 0.84 0.75  --   --   TROPONINIHS 10  --   --   --      Estimated Creatinine Clearance: 42.9 mL/min (by C-G formula based on SCr of 0.75 mg/dL).   Medical History: Past Medical History:  Diagnosis Date   AAA (abdominal aortic aneurysm) 01/2009   AAA (2.8 x 3.0)  moderate RAS (left); 2.7 x 2.7 cm (07/11/05)   Acute bronchitis 03/20/2013; 2017   Angina    Anosmia    Asthma    Baker's cyst of knee  01/30/2014   Basal cell carcinoma    "back and left arm"   Bradycardia    Metoprolol stopped 08/2011   CAD (coronary artery disease)    s/CABG (reports IMA and 2 SVGs) back in 1999  Myoview normal 3/10; s/p PCI with DES to PL branch of distal RCA 09/2011; PCI +DES to SVG-RCA, PCI + DES to mid LCx 12/2015   Cerebrovascular disease 01/2009   carotid u/s  R 0-39%   L 60-79%   Chronic thoracic back pain    COPD (chronic obstructive pulmonary disease) (HCC)    Depression    Dizziness    Dysrhythmia    hx of sinus brady   GERD (gastroesophageal reflux disease)    Heart murmur    Herniated lumbar disc without myelopathy    History of blood transfusion 1999   "when I had the bypass; had a PE"   Hyperglycemia 10/23/2015   Hyperlipidemia    Hypertension    Hypothyroidism 09/30/2016   Medicare annual wellness visit, subsequent 07/31/2013   NSTEMI (non-ST elevated myocardial infarction) ([place]) 11/12   Cath showed atretic IMA graft to the LAD, SVG to PD was patent but the continuation of this  graft to the PL branch was occluded; there were L to R collaterals; Mid LAD had a 60 to 70% stenosis. She has been treated medically.  Neg Myoview 05/2011   Osteoarthritis of back    Osteoporosis    Pneumonia "several times"   Pulmonary embolism ([place]) 1999   "after my bypass"   PVD (peripheral vascular disease) ([place])    Shortness of breath    Squamous carcinoma    "nose"   Thyroid disease    Hypothyroid   Unstable angina ([place]) 09/25/2015    Medications:  See electronic med rec  Assessment: 80 y.o. F presents with SOB and coughing up blood. Found to have PE on CT scan. Noted pt was on Eliquis PTA for afib - last dose was 11/15 pm - pt was holding Eliquis for planned biopsy on 11/17.   Heparin initiated at 1000 units/hr but switched this AM to enoxaparin given PE treatment in active cancer patient. Decision made to switch back to heparin at this time in preparation for biopsy.  Baseline aPTT/Heparin level not  initially collected.Given time since last DOAC and receipt of enoxaparin this morning will collect baseline coags. Will need to utilize PTT monitoring.  11/18 AM update: aPTT sub-therapeutic   Goal of Therapy:  Heparin level 0.3-0.7 units/ml, aPTT 66-102 sec Monitor platelets by anticoagulation protocol: Yes   Plan: Inc heparin to 1100 units/hr Re-check heparin level and aPTT in 8 hours  Narda Bonds, PharmD, [place] Pharmacist Phone: [phone]

## 2021-04-12 NOTE — Care Management (Signed)
1058 04-12-21  Case Manager spoke with patient regarding disposition needs. Patient is independent from home. Patient states her granddaughter will be moving in with her soon. Patient states she has oxygen 2 L with [employer] and she made a request to have a portable oxygen concentrator (POC) similar to the [employer] tank for home. Case Manager did reach out to [facility] and she is active: Per Liaison- MD will need to order a re-qualification testing (ambulatory saturation) to see if she qualifies for the POC. If the patient qualifies; the office has asked to place POC in the comment section along with the diagnosis code. Patient has a history of COPD. Patient will benefit from PT/OT consult before transition home. Case Manager will continue to follow for additional needs.

## 2021-04-12 NOTE — Care Management Obs Status (Signed)
[facility] NOTIFICATION   Patient Details  Name: Andrea [name] MRN: [id number] Date of Birth: [birth year]   Medicare Observation Status Notification Given:  Yes    Bethena Roys, RN 04/12/2021, 10:45 AM

## 2021-04-12 NOTE — Evaluation (Signed)
Physical Therapy Evaluation Patient Details Name: Andrea [name] MRN: [id number] DOB: 22-Mar-1941 Today's Date: 04/12/2021  History of Present Illness  80 y.o. female presents to [facility] hospital on 04/10/2021 with R sided chest pain. Pt found to have LUL mass as well as lytic lesion on posterior 9th rib. PT also found to have tumor thrombus, started on anticoagulation. Plan for lung biopsy 11/21. PMH includes COPD, with chronic hypoxemic respiratory failure, OSA, atrial fibrillation, CAD, chronic diastolic heart failure and hypothyroidism.  Clinical Impression  Pt presents to PT with deficits in activity tolerance, power, endurance, gait, balance, strength. Pt with R flank pain which limits mobility and activity tolerance. Pt demonstrates instability when standing and benefits form UE support to aide in maintaining balance currently. Pt will benefit from frequent mobilization during this admission to aide in improving activity tolerance and reducing falls risk. Pt will benefit from assessment of gait with a 4 wheeled walker with seat next session in the hope of improving balance and activity tolerance.     Recommendations for follow up therapy are one component of a multi-disciplinary discharge planning process, led by the attending physician.  Recommendations may be updated based on patient status, additional functional criteria and insurance authorization.  Follow Up Recommendations Home health PT (PT recommends HHPT however pt declining at this time)    Assistance Recommended at Discharge Intermittent Supervision/Assistance  Functional Status Assessment Patient has had a recent decline in their functional status and demonstrates the ability to make significant improvements in function in a reasonable and predictable amount of time.  Equipment Recommendations  Rollator (4 wheels)    Recommendations for Other Services       Precautions / Restrictions Precautions Precautions: Fall Precaution  Comments: monitor SpO2 Restrictions Weight Bearing Restrictions: No      Mobility  Bed Mobility Overal bed mobility: Needs Assistance Bed Mobility: Rolling;Sidelying to Sit Rolling: Supervision Sidelying to sit: Supervision;HOB elevated       General bed mobility comments: increased time and use of rails    Transfers Overall transfer level: Needs assistance Equipment used: None Transfers: Sit to/from Stand Sit to Stand: Min guard           General transfer comment: pt utilizing armrest of recliner to push up into standing    Ambulation/Gait Ambulation/Gait assistance: Min guard Gait Distance (Feet): 30 Feet Assistive device: IV Pole Gait Pattern/deviations: Step-to pattern Gait velocity: reduced Gait velocity interpretation: <1.8 ft/sec, indicate of risk for recurrent falls   General Gait Details: pt with short step-to gait, BUE support of IV pole, increased postural sway  Stairs            Wheelchair Mobility    Modified Rankin (Stroke Patients Only)       Balance Overall balance assessment: Needs assistance Sitting-balance support: No upper extremity supported;Feet supported Sitting balance-Leahy Scale: Good     Standing balance support: Reliant on assistive device for balance Standing balance-Leahy Scale: Poor Standing balance comment: pt needing minA or UE support of IV pole                             Pertinent Vitals/Pain Pain Assessment: Faces Faces Pain Scale: Hurts whole lot Pain Location: R flank Pain Descriptors / Indicators: Moaning Pain Intervention(s): Monitored during session    Home Living Family/patient expects to be discharged to:: Private residence Living Arrangements: Other relatives;Children (daughter coming temporarily, granddaughter may stay permanently) Available Help at Discharge: Family;Available 24  hours/day Type of Home: House Home Access: Stairs to enter Entrance Stairs-Rails: None Entrance  Stairs-Number of Steps: 2   Home Layout: Two level;Able to live on main level with bedroom/bathroom Home Equipment: None      Prior Function Prior Level of Function : Independent/Modified Independent             Mobility Comments: pt was utilizing 2L [place] prior to admission, independent in all mobility and ADLs       Hand Dominance        Extremity/Trunk Assessment   Upper Extremity Assessment Upper Extremity Assessment: Generalized weakness (pain in rib area and muscle spasms appear to limit UE mobility/strength at times)    Lower Extremity Assessment Lower Extremity Assessment: Generalized weakness    Cervical / Trunk Assessment Cervical / Trunk Assessment: Kyphotic  Communication   Communication: No difficulties  Cognition Arousal/Alertness: Awake/alert Behavior During Therapy: WFL for tasks assessed/performed Overall Cognitive Status: Within Functional Limits for tasks assessed                                          General Comments General comments (skin integrity, edema, etc.): pt on 3L [place] upon PT arrival, PT able to wean to 2L [place] at rest and with mobility, sats stable at 93-94%.    Exercises     Assessment/Plan    PT Assessment Patient needs continued PT services  PT Problem List Decreased strength;Decreased activity tolerance;Decreased balance;Decreased mobility;Cardiopulmonary status limiting activity;Pain       PT Treatment Interventions DME instruction;Gait training;Stair training;Therapeutic activities;Functional mobility training;Therapeutic exercise;Balance training;Neuromuscular re-education;Patient/family education    PT Goals (Current goals can be found in the Care Plan section)  Acute Rehab PT Goals Patient Stated Goal: to go home and reduce pain PT Goal Formulation: With patient Time For Goal Achievement: 04/26/21 Potential to Achieve Goals: Good Additional Goals Additional Goal #1: Pt will report 3/10 DOE or less when  ambulating >100' to demonstrate improved activity tolerance    Frequency Min 3X/week   Barriers to discharge        Co-evaluation               AM-PAC PT "6 Clicks" Mobility  Outcome Measure Help needed turning from your back to your side while in a flat bed without using bedrails?: A Little Help needed moving from lying on your back to sitting on the side of a flat bed without using bedrails?: A Little Help needed moving to and from a bed to a chair (including a wheelchair)?: A Little Help needed standing up from a chair using your arms (e.g., wheelchair or bedside chair)?: A Little Help needed to walk in hospital room?: A Little Help needed climbing 3-5 steps with a railing? : A Lot 6 Click Score: 17    End of Session Equipment Utilized During Treatment: Oxygen Activity Tolerance: Patient limited by fatigue Patient left: in chair;with call bell/phone within reach;with family/visitor present Nurse Communication: Mobility status PT Visit Diagnosis: Other abnormalities of gait and mobility (R26.89);Muscle weakness (generalized) (M62.81)    Time: 0630-1601 PT Time Calculation (min) (ACUTE ONLY): 34 min   Charges:   PT Evaluation $PT Eval Moderate Complexity: 1 Mod         Zenaida Niece, PT, DPT Acute Rehabilitation Pager: [phone] Office [phone]   Zenaida Niece 04/12/2021, 3:10 PM

## 2021-04-12 NOTE — Progress Notes (Signed)
HOSPITAL MEDICINE OVERNIGHT EVENT NOTE    Nursing reports the patient is developing substantial hypotension with systolic blood pressures now in the 80s.  Chart reviewed, patient is suffering from substantial acute pulmonary embolism in the setting of a large upper lobe mass, currently being managed with intravenous heparin.  Patient is awake and alert, tolerating oral intake and is urinating.  No evidence of worsening chest pain or shortness of breath.  Review of recent echocardiogram reveals a preserved ejection fraction.  We will provide a 500 cc bolus of lactated [place] over 2 hours, reassess blood pressure.  Vernelle Emerald  MD [facility]

## 2021-04-12 NOTE — Progress Notes (Signed)
PROGRESS NOTE    Andrea [name]  VFI:[id number] DOB: April 26, 1941 DOA: 04/10/2021 PCP: Mosie Lukes, MD    Brief Narrative:  Andrea [name] was admitted to the hospital with the working diagnosis of acute pulmonary embolism/ possible tumor thrombus in the setting of left upper lobe lung mass.     80 yo female with the past medical history of COPD, with chronic hypoxemic respiratory failure, OSA, atrial fibrillation, CAD, chronic diastolic heart failure and hypothyroidism, who presented with right sided chest pain.  Patient reported coughing for the last 24 weeks, she has been diagnosed with left upper lobe mass and lytic lesion on the posterior right ninth rib, planned biopsy per pulmonary on 04/15/21.  On 11/16 night she acute onset of severe pain on the right posterior rib cage, worse with coughing and severe enough that she came to the hospital for further evaluation.  On her initial physical examination her blood pressure was 115/66, HR 66 and RR24, oxygen saturation 97% on supplemental 0-2 per [place] 2 L/min.  Her lungs had decreased breath sounds bilaterally but no wheezing, heart S1 and S2 present and rhythmic, abdomen was soft and non tender and no lower extremity edema.    NA 132, K 3,8, Cl 97, bicarbonate 29, glucose 113, BUN 10 and cr 0,84 Wbc 12,9, hgb 10,5, hct 35,2 and plt 331. SARS COVID 19 negative   Chest film with large mass on peripheral left upper lobe. Positive interstitial infiltrates left upper lobe CT chest  left upper lobe with bland thrombus versus tumor thrombus. No right heart stranding.  Large area of consolidation in the left upper lobe with lymphangitic spread.  Right posterior ninth rib lesion with pathologic fracture.  1 cm right upper lobe subpleural nodule.    EKG 88 bpm, right ward axis, normal intervals, sinus rhythm with positive PAC, no significant st segment, negative T wave at the II, III, and Avf.    Patient was placed on IV opioid analgesics for pain  control and supplemental 02 per [place] Heparin drip for anticoagulation and IR consulted for lung biopsy (transthoracic).   Old records personally reviewed, patient under work up for left upper lobe mass, plan for transthoracic biopsy as first step. If negative results 2nd step for bronchoscopy.  Consulted IR for inpatient biopsy, pulmonary team Dr Lamonte Sakai has been notified.   Assessment & Plan:   Principal Problem:   Pulmonary embolism (HCC) Active Problems:   CAD (coronary artery disease)   Hypothyroidism   Chronic respiratory failure with hypoxia (HCC)   Atrial fibrillation, chronic (HCC)   COPD (chronic obstructive pulmonary disease) (HCC)   Chronic diastolic CHF (congestive heart failure) (HCC)   Lung mass   Pathological fracture of one rib   Microcytic anemia   Acute pulmonary embolism (positive tumor thrombus), in the setting of large left upper lobe mass, complicated with pathologic 9th rib fracture.  Positive pain 10/10 intensity, worse with movement and deep inspiration. She has  been using IV hydromorphone and oral oxycodone for pain control but not back to baseline and continue to have significant decreased in mobility.   Echocardiogram with preserved LV systolic function with EF 50 to 55%, moderate hypokinesis left ventricle based mid inferior wall and inferior septal wall.   Plan to add cyclobenzaprine for muscle relaxant. Continue current analgesics regimen, including scheduled acetaminophen Consult PT and OT and encourage to get out of bed to chair, tid with meals.    Continue anticoagulation with IV heparin for pulmonary embolism,  Ok to hold on anticoagulation 4 hrs before lung biopsy.  Oxymetry monitoring and supplemental 02 per [place] to keep 02 saturation 88% or greater.    2. COPD  No signs of clinical exacerbation. On bronchodilator therapy, fluticasone, vilanterol, umeclidinium    3. Paroxysmal atrial fibrillation.  On defetilide and diltiazem for rate control.   Anticoagulation with heparin  Continue telemetry monitoring, patient is in risk of developing RVR in the setting of acute illness.    4. Chronic diastolic heart failure. No signs of exacerbation, continue blood pressure monitoring.    5. CAD/ dyslipidemia.  Continue with isosorbide and statin therapy (rosuvastatin)  Patient with no chest pain Continue to holding clopidogrel in preparation for lung biopsy    6. Hypothyroid, On levothyroxine  7. Hyponatremia/ hypomagnesemia continue with unrestricted diet and will consult nutrition for evaluation.  Today serum Na is 132, renal function stable with serum cr at 0.,90 with K at 4,5 and serum bicarbonate at 29. Mg 1,8  Add 2 g Mag sulfate  8. Depression. Continue with fluoxetine.    Patient continue to be at high risk for worsening chest pain and developing of respiratory failure   Status is: Observation  The patient will require care spanning > 2 midnights and should be moved to inpatient because: uncontrolled pain, poor mobility, anticoagulation with IV heparin       DVT prophylaxis: IV heparin   Code Status:    full  Family Communication:   No family at the bedside, patient will call her family for updates, I offered to call but she prefers to communicate with them directly      Consultants:  IR     Subjective: Patient continue to have severe pain, worse with movement and deep inspiration, not back to her baseline, dyspnea with pain, no nausea or vomiting,.   Objective: Vitals:   04/12/21 0401 04/12/21 0748 04/12/21 0840 04/12/21 1139  BP: (!) 110/59 (!) 125/50  114/63  Pulse: 70 74  68  Resp: 16 16  18   Temp: 98.5 F (36.9 C) 98.3 F (36.8 C)  98.4 F (36.9 C)  TempSrc: Oral Oral  Oral  SpO2: 92% 94% 95% 96%  Weight: 59.6 kg     Height:        Intake/Output Summary (Last 24 hours) at 04/12/2021 1258 Last data filed at 04/12/2021 0552 Gross per 24 hour  Intake 275.18 ml  Output 400 ml  Net -124.82 ml    Filed Weights   04/10/21 2124 04/12/21 0401  Weight: 56.7 kg 59.6 kg    Examination:   General: Not in pain or dyspnea, deconditioned and in pain.  Neurology: Awake and alert, non focal  E ENT: mild pallor, no icterus, oral mucosa moist Cardiovascular: No JVD. S1-S2 present, rhythmic, no gallops, rubs, or murmurs. No lower extremity edema. Pulmonary: positive breath sounds bilaterally, with no wheezing, or rhonchi scattered rales. Prolonged expiratory phase Gastrointestinal. Abdomen soft and non tender Skin. No rashes Musculoskeletal: no joint deformities positive clubbing      Data Reviewed: I have personally reviewed following labs and imaging studies  CBC: Recent Labs  Lab 04/10/21 2122 04/12/21 0418  WBC 12.9* 11.0*  NEUTROABS 11.0*  --   HGB 10.5* 9.2*  HCT 35.2* 30.5*  MCV 76.4* 75.9*  PLT 331 341   Basic Metabolic Panel: Recent Labs  Lab 04/10/21 2122 04/11/21 0310 04/12/21 0418  NA 135 132* 132*  K 3.8 3.5 4.5  CL 97* 96* 96*  CO2 29 27 29   GLUCOSE 113* 135* 105*  BUN 10 10 10   CREATININE 0.84 0.75 0.90  CALCIUM 8.5* 8.1* 8.3*  MG  --  1.8  --    GFR: Estimated Creatinine Clearance: 38.1 mL/min (by C-G formula based on SCr of 0.9 mg/dL). Liver Function Tests: No results for input(s): AST, ALT, ALKPHOS, BILITOT, PROT, ALBUMIN in the last 168 hours. No results for input(s): LIPASE, AMYLASE in the last 168 hours. No results for input(s): AMMONIA in the last 168 hours. Coagulation Profile: No results for input(s): INR, PROTIME in the last 168 hours. Cardiac Enzymes: No results for input(s): CKTOTAL, CKMB, CKMBINDEX, TROPONINI in the last 168 hours. BNP (last 3 results) No results for input(s): PROBNP in the last 8760 hours. HbA1C: No results for input(s): HGBA1C in the last 72 hours. CBG: No results for input(s): GLUCAP in the last 168 hours. Lipid Profile: No results for input(s): CHOL, HDL, LDLCALC, TRIG, CHOLHDL, LDLDIRECT in the last 72  hours. Thyroid Function Tests: No results for input(s): TSH, T4TOTAL, FREET4, T3FREE, THYROIDAB in the last 72 hours. Anemia Panel: No results for input(s): VITAMINB12, FOLATE, FERRITIN, TIBC, IRON, RETICCTPCT in the last 72 hours.    Radiology Studies: I have reviewed all of the imaging during this hospital visit personally     Scheduled Meds:  diltiazem  240 mg Oral Daily   dofetilide  125 mcg Oral BID   FLUoxetine  40 mg Oral Daily   fluticasone furoate-vilanterol  1 puff Inhalation Daily   And   umeclidinium bromide  1 puff Inhalation Daily   isosorbide mononitrate  60 mg Oral BID   levothyroxine  75 mcg Oral Daily   mouth rinse  15 mL Mouth Rinse BID   pantoprazole  40 mg Oral Daily   rosuvastatin  40 mg Oral Daily   senna  1 tablet Oral BID   sodium chloride flush  3 mL Intravenous Q12H   Continuous Infusions:  heparin 1,100 Units/hr (04/12/21 0331)     LOS: 0 days        Andrea Heitman Gerome Apley, MD

## 2021-04-12 NOTE — Consult Note (Signed)
Chief Complaint: Patient was seen in consultation today for  CT guided left lung mass biopsy  Referring Physician(s): Arrien,M/Byrum,R  Supervising Physician: Mir, Sharen Heck  Patient Status: [facility] - In-pt  History of Present Illness: Andrea [name] is an 80 y.o. 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  Patient recently presented to [facility] with right-sided chest pain along with coughing.  PET scan performed 03/23/2021 revealed:    7.6 cm left upper lobe mass, reflecting primary bronchogenic neoplasm. 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 Trace left pleural effusion.   Abnormal soft tissue in the left perihilar region extending into the mediastinum. Associated prevascular/AP window nodal metastases.   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   8 mm ground-glass nodule in the posterior right upper lobe, minimally progressive from 2021, non FDG avid. This is not considered suspicious for metastasis. Attention on follow-up is considered sufficient.   3.8 cm infrarenal abdominal aortic aneurysm  CT angio chest performed on 11/17 revealed:   1. Left upper lobe bland thrombus versus tumor thrombus. No CT evidence of right heart stranding. 2. Large area of consolidation in the left upper lobe in keeping with known malignancy with associated lymphangitic spread in the left upper lobe. 3. Right posterior ninth rib lytic lesion with associated pathologic fracture. 4. Cardiomegaly with evidence of right heart dysfunction. 5. A 1 cm right upper lobe  subpleural nodule. 6. Aortic Atherosclerosis (ICD10-I70.0) and Emphysema   Patient was evaluated by pulmonology regarding left lung mass biopsy.  They recommend first attempting image guided needle biopsy of lung mass to avoid general anesthesia and if needle biopsy is nondiagnostic then they will proceed with bronchoscopy.  She is currently on IV heparin.  Latest labs include WBC 11, hemoglobin 9.2, platelets normal, creatinine normal, PT/INR pending.    Past Medical History:  Diagnosis Date   AAA (abdominal aortic aneurysm) 01/2009   AAA (2.8 x 3.0)  moderate RAS (left); 2.7 x 2.7 cm (07/11/05)   Acute bronchitis 03/20/2013; 2017   Angina    Anosmia    Asthma    Baker's cyst of knee 01/30/2014   Basal cell carcinoma    "back and left arm"   Bradycardia    Metoprolol stopped 08/2011   CAD (coronary artery disease)    s/CABG (reports IMA and 2 SVGs) back in 1999  Myoview normal 3/10; s/p PCI with DES to PL branch of distal RCA 09/2011; PCI +DES to SVG-RCA, PCI + DES to mid LCx 12/2015   Cerebrovascular disease 01/2009   carotid u/s  R 0-39%   L 60-79%   Chronic thoracic back pain    COPD (chronic obstructive pulmonary disease) (HCC)    Depression    Dizziness    Dysrhythmia    hx of sinus brady   GERD (gastroesophageal reflux disease)    Heart murmur    Herniated lumbar disc without myelopathy    History of blood transfusion 1999   "when I had the bypass; had a PE"   Hyperglycemia 10/23/2015   Hyperlipidemia    Hypertension    Hypothyroidism 09/30/2016   Medicare annual wellness visit, subsequent 07/31/2013   NSTEMI (non-ST elevated myocardial infarction) [facility]) 11/12   Cath showed atretic  IMA graft to the LAD, SVG to PD was patent but the continuation of this graft to the PL branch was occluded; there were L to R collaterals; Mid LAD had a 60 to 70% stenosis. She has been treated medically.  Neg Myoview 05/2011   Osteoarthritis of back    Osteoporosis    Pneumonia "several times"    Pulmonary embolism ([place]) 1999   "after my bypass"   PVD (peripheral vascular disease) ([place])    Shortness of breath    Squamous carcinoma    "nose"   Thyroid disease    Hypothyroid   Unstable angina ([place]) 09/25/2015    Past Surgical History:  Procedure Laterality Date   ABDOMINAL AORTIC ANEURYSM REPAIR     pt denies this hx on 01/02/2016   BASAL CELL CARCINOMA EXCISION     CARDIAC CATHETERIZATION N/A 01/02/2016   Procedure: Left Heart Cath and Coronary Angiography;  Surgeon: Wellington Hampshire, MD;  Location: [place] [facility];  Service: Cardiovascular;  Laterality: N/A;   CARDIAC CATHETERIZATION  1996; 1999   CARDIAC CATHETERIZATION N/A 06/25/2016   Procedure: Left Heart Cath and Cors/Grafts Angiography;  Surgeon: Wellington Hampshire, MD;  Location: [place] [facility];  Service: Cardiovascular;  Laterality: N/A;   CARDIOVERSION N/A 10/19/2019   Procedure: CARDIOVERSION;  Surgeon: Jerline Pain, MD;  Location: [place];  Service: Cardiovascular;  Laterality: N/A;   CARDIOVERSION N/A 04/25/2020   Procedure: CARDIOVERSION;  Surgeon: Jerline Pain, MD;  Location: [facility] ENDOSCOPY;  Service: Cardiovascular;  Laterality: N/A;   CAROTID ENDARTERECTOMY Left 01/02/2011   CATARACT EXTRACTION W/ INTRAOCULAR LENS  IMPLANT, BILATERAL     CLOSED REDUCTION NASAL FRACTURE  11/2007   CORONARY ANGIOPLASTY WITH STENT PLACEMENT  10/10/2011   drug eluting  to rc & saphenous   CORONARY ARTERY BYPASS GRAFT  1999   "CABG X3"   DILATION AND CURETTAGE OF UTERUS     FEMORAL ARTERY STENT Bilateral    FRACTURE SURGERY     LEFT HEART CATHETERIZATION WITH CORONARY/GRAFT ANGIOGRAM N/A 04/22/2011   Procedure: LEFT HEART CATHETERIZATION WITH Beatrix Fetters;  Surgeon: Jolaine Artist, MD;  Location: [facility] CATH LAB;  Service: Cardiovascular;  Laterality: N/A;   LEFT HEART CATHETERIZATION WITH CORONARY/GRAFT ANGIOGRAM N/A 10/10/2011   Procedure: LEFT HEART CATHETERIZATION WITH Beatrix Fetters;  Surgeon: Peter  M Martinique, MD;  Location: [facility] CATH LAB;  Service: Cardiovascular;  Laterality: N/A;   NASAL SINUS SURGERY     twice   SQUAMOUS CELL CARCINOMA EXCISION     SVT ABLATION N/A 03/19/2018   Procedure: SVT ABLATION;  Surgeon: Constance Haw, MD;  Location: [place] [facility];  Service: Cardiovascular;  Laterality: N/A;   TUBAL LIGATION      Allergies: Erythromycin, Tape, Meperidine, Shellfish allergy, Ciprofloxacin, Codeine, Latex, and Penicillins  Medications: Prior to Admission medications   Medication Sig Start Date End Date Taking? Authorizing Provider  acetaminophen (TYLENOL) 650 MG CR tablet Take 1,300 mg by mouth 2 (two) times daily.   Yes [provider]  albuterol (PROVENTIL) (2.5 MG/3ML) 0.083% nebulizer solution Take 3 mLs (2.5 mg total) by nebulization every 4 (four) hours as needed for wheezing or shortness of breath. Dx:J44.9 03/25/19  Yes Kasa, Maretta Bees, MD  albuterol (PROVENTIL) (5 MG/ML) 0.5% nebulizer solution Take 0.5 mLs (2.5 mg total) by nebulization every 6 (six) hours as needed for wheezing or shortness of breath. 04/10/20  Yes Charlesetta Shanks, MD  albuterol (VENTOLIN HFA) 108 (90 Base) MCG/ACT inhaler  Inhale 2 puffs into the lungs every 6 (six) hours as needed for wheezing or shortness of breath. 03/05/21  Yes Parrett, Tammy S, NP  azelastine (ASTELIN) 0.1 % nasal spray Place 2 sprays into both nostrils at bedtime as needed for rhinitis. 06/28/18  Yes Mosie Lukes, MD  Biotin 5 MG TABS Take 5 mg by mouth daily.    Yes [provider]  Calcium Carbonate-Vitamin D (CALCIUM + D PO) Take 1 tablet by mouth 2 (two) times a week.    Yes [provider]  Cholecalciferol (VITAMIN D3) 2000 units TABS Take 2,000 Units by mouth daily.    Yes [provider]  clopidogrel (PLAVIX) 75 MG tablet TAKE 1 TABLET BY MOUTH ONCE A DAY Patient taking differently: Take 75 mg by mouth daily. 01/09/21  Yes Gollan, Kathlene November, MD  diltiazem (CARDIZEM CD) 240 MG 24  hr capsule TAKE 1 CAPSULE BY MOUTH ONCE DAILY Patient taking differently: 240 mg daily. 09/12/20  Yes Bhagat, Bhavinkumar, PA  diltiazem (CARDIZEM) 30 MG tablet Take one tablet by mouth every 4 hours AS NEEDED for A-fib HR > 100 as long as BP > 100. 11/11/18  Yes Fenton, Clint R, PA  dofetilide (TIKOSYN) 125 MCG capsule TAKE 1 CAPSULE BY MOUTH TWICE DAILY Patient taking differently: Take 125 mcg by mouth 2 (two) times daily. 02/26/21  Yes Baldwin Jamaica, PA-C  ezetimibe (ZETIA) 10 MG tablet TAKE 1 TABLET BY MOUTH DAILY Patient taking differently: Take 10 mg by mouth daily. 01/09/21  Yes Gollan, Kathlene November, MD  ferrous fumarate (HEMOCYTE - 106 MG FE) 325 (106 Fe) MG TABS tablet Take 1 tablet (106 mg of iron total) by mouth daily. 01/15/21  Yes Mosie Lukes, MD  FLUoxetine (PROZAC) 40 MG capsule Take 1 capsule (40 mg total) by mouth daily. 02/27/21  Yes Mosie Lukes, MD  Fluticasone-Umeclidin-Vilant (TRELEGY ELLIPTA) 100-62.5-25 MCG/INH AEPB INAHLE 1 PUFF INTO THE LUNGS DAILY Patient taking differently: Inhale 1 puff into the lungs daily. 02/17/21  Yes Collene Gobble, MD  folic acid (FOLVITE) 1 MG tablet Take 1 tablet (1 mg total) by mouth daily. 01/15/21  Yes Mosie Lukes, MD  furosemide (LASIX) 20 MG tablet Take 1 tablet (20 mg total) by mouth as directed. Take 1 tablet (20 mg) daily and extra 1 tablet (20 mg) at 2 PM for shortness of breath or abdominal swelling 01/27/20  Yes Gollan, Kathlene November, MD  HYDROcodone-acetaminophen (NORCO) 10-325 MG tablet Take 1 tablet by mouth every 6 (six) hours as needed for severe pain. 04/02/21  Yes Mosie Lukes, MD  ipratropium (ATROVENT) 0.02 % nebulizer solution Take 2.5 mLs (0.5 mg total) by nebulization 4 (four) times daily. Patient taking differently: Take 0.5 mg by nebulization every 4 (four) hours as needed. 04/10/20  Yes Charlesetta Shanks, MD  isosorbide mononitrate (IMDUR) 60 MG 24 hr tablet TAKE 1 TABLET BY MOUTH TWICE A DAY Patient taking differently:  60 mg 2 (two) times daily. 11/09/20  Yes Gollan, Kathlene November, MD  Krill Oil 500 MG CAPS Take 500 mg by mouth daily.    Yes [provider]  levothyroxine (SYNTHROID) 75 MCG tablet Take 1 tablet (75 mcg total) by mouth daily. 01/10/21  Yes Mosie Lukes, MD  nitroGLYCERIN (NITROSTAT) 0.4 MG SL tablet Place 1 tablet (0.4 mg total) under the tongue every 5 (five) minutes as needed for chest pain. 06/22/20  Yes Baldwin Jamaica, PA-C  omeprazole (PRILOSEC) 20 MG capsule TAKE  1 CAPSULE BY MOUTH ONCE DAILY Patient taking differently: 20 mg daily. 01/08/21  Yes Mosie Lukes, MD  ondansetron (ZOFRAN) 4 MG tablet Take 1 tablet (4 mg total) by mouth every 8 (eight) hours as needed for nausea or vomiting. 03/22/21 03/22/22 Yes Parrett, Tammy S, NP  Polyethyl Glycol-Propyl Glycol (SYSTANE OP) Place 1 drop into both eyes 2 (two) times daily as needed (dry/irritated eyes.).    Yes [provider]  potassium chloride (KLOR-CON) 10 MEQ tablet Take 1 tablet (10 mEq total) by mouth daily. And take 2 tablets (48meq) on days you take furosemide 04/26/20  Yes Baldwin Jamaica, PA-C  rosuvastatin (CRESTOR) 40 MG tablet TAKE 1 TABLET BY MOUTH ONCE DAILY Patient taking differently: Take 40 mg by mouth daily. 01/09/21  Yes Minna Merritts, MD  zinc gluconate 50 MG tablet Take 50 mg by mouth daily.   Yes [provider]  albuterol (VENTOLIN HFA) 108 (90 Base) MCG/ACT inhaler Inhale into the lungs every 6 (six) hours as needed for wheezing or shortness of breath. Patient not taking: Reported on 04/11/2021    [provider]  ELIQUIS 2.5 MG TABS tablet TAKE 1 TABLET BY MOUTH TWICE A DAY Patient taking differently: Take 2.5 mg by mouth 2 (two) times daily. 02/26/21   Camnitz, Ocie Doyne, MD     Family History  Problem Relation Age of Onset   Heart attack Mother 26   Diabetes Mother    Colon cancer Father 67   Lung cancer Father        smoked   Heart disease Father    Hypertension  Father    Angina Father    Lung cancer Sister        smoked   Hypothyroidism Sister    Hypertension Brother    Heart attack Brother    Heart disease Brother        PTCA with Stent   Heart attack Brother        CABG   Heart disease Brother        CABG with 1 bypass   Aneurysm Brother        brain   Alcohol abuse Brother    Hyperlipidemia Daughter    Diabetes Maternal Grandmother    Stroke Maternal Grandmother    Cirrhosis Maternal Grandfather    Stroke Paternal Grandmother    Obesity Daughter    Obesity Son     Social History   Socioeconomic History   Marital status: Married    Spouse name: Not on file   Number of children: 5   Years of education: Not on file   Highest education level: Not on file  Occupational History   Occupation: Retired    Fish farm manager: RETIRED    Comment: former  Marine scientist  Tobacco Use   Smoking status: Former    Packs/day: 0.50    Years: 40.00    Pack years: 20.00    Types: Cigarettes    Start date: 1958    Quit date: 01/19/1993    Years since quitting: 28.2   Smokeless tobacco: Never  Vaping Use   Vaping Use: Never used  Substance and Sexual Activity   Alcohol use: Yes    Comment: 01/02/2016 "might drink a glass of wine socially q 3-4 months"   Drug use: No   Sexual activity: Not Currently    Birth control/protection: Post-menopausal  Other Topics Concern   Not on file  Social History Narrative   Married with  5 children   Social Determinants of Radio broadcast assistant Strain: Not on file  Food Insecurity: Not on file  Transportation Needs: Not on file  Physical Activity: Not on file  Stress: Not on file  Social Connections: Not on file      Review of Systems currently denies fever, abdominal pain, nausea, vomiting; she has had night sweats, occasional headaches, persistent right-sided chest pain with some radiation to her back, dyspnea with exertion and occasional cough with some blood-tinged sputum  Vital Signs: BP 114/63 (BP  Location: Right Arm)   Pulse 68   Temp 98.4 F (36.9 C) (Oral)   Resp 18   Ht 5\' 2"  (1.575 m)   Wt 131 lb 6.3 oz (59.6 kg)   SpO2 96%   BMI 24.03 kg/m   Physical Exam awake, alert.  Chest with distant breath sounds bilaterally and diminished left upper lobe; heart with regular rate and rhythm.  Abdomen soft, positive bowel sounds, nontender.  No lower extremity edema.  Clubbed digits  Imaging: CT Angio Chest PE W and/or Wo Contrast  Result Date: 04/11/2021 CLINICAL DATA:  Concern for pulmonary embolism.  Left lung mass. EXAM: CT ANGIOGRAPHY CHEST WITH CONTRAST TECHNIQUE: Multidetector CT imaging of the chest was performed using the standard protocol during bolus administration of intravenous contrast. Multiplanar CT image reconstructions and MIPs were obtained to evaluate the vascular anatomy. CONTRAST:  15ml of Omni 350 COMPARISON:  Chest radiograph dated 04/10/2021 and head CT dated 03/21/2021 FINDINGS: Cardiovascular: There is cardiomegaly. No pericardial effusion. There is coronary vascular calcification and postsurgical changes of CABG. Retrograde flow of contrast from the right atrium into the IVC suggestive of right heart dysfunction. Moderate atherosclerotic calcification of the thoracic aorta. There is linear nearly occlusive thrombus in the left upper lobe pulmonary artery branch. This may represent a bland thrombus versus tumor thrombus. No CT evidence of right heart stranding. Mediastinum/Nodes: Evaluation of the left hilum is limited due to consolidative changes of the left upper lobe. No mediastinal fluid collection. Lungs/Pleura: Large area of consolidation in the left upper lobe as seen on the PET-CT in keeping with known malignancy with associated lymphangitic spread in the left upper lobe. There is background of emphysema. A 1 cm right upper lobe subpleural nodule. No significant pleural effusion. No pneumothorax. The central airways are patent. Upper Abdomen: Small hiatal hernia.  Musculoskeletal: A 17 mm lytic lesion involving the posterior right ninth rib with associated pathologic fracture. Median sternotomy wires. Review of the MIP images confirms the above findings. IMPRESSION: 1. Left upper lobe bland thrombus versus tumor thrombus. No CT evidence of right heart stranding. 2. Large area of consolidation in the left upper lobe in keeping with known malignancy with associated lymphangitic spread in the left upper lobe. 3. Right posterior ninth rib lytic lesion with associated pathologic fracture. 4. Cardiomegaly with evidence of right heart dysfunction. 5. A 1 cm right upper lobe subpleural nodule. 6. Aortic Atherosclerosis (ICD10-I70.0) and Emphysema (ICD10-J43.9). These results were called by telephone at the time of interpretation on 04/11/2021 at 1:28 am to Dr. Sedonia Small, who verbally acknowledged these results. Electronically Signed   By: Anner Crete M.D.   On: 04/11/2021 01:31   NM PET Image Initial (PI) Skull Base To Thigh  Result Date: 03/23/2021 CLINICAL DATA:  Initial treatment strategy for left lung mass. EXAM: NUCLEAR MEDICINE PET SKULL BASE TO THIGH TECHNIQUE: 6.1 mCi F-18 FDG was injected intravenously. Full-ring PET imaging was performed from the skull base  to thigh after the radiotracer. CT data was obtained and used for attenuation correction and anatomic localization. Fasting blood glucose: 25 mg/dl COMPARISON:  CT chest dated 03/07/2021 FINDINGS: Mediastinal blood pool activity: SUV max 2.5 Liver activity: SUV max NA NECK: No hypermetabolic cervical lymphadenopathy. Incidental CT findings: none CHEST: 7.6 cm left upper lobe mass (series 8/image 20), reflecting primary bronchogenic neoplasm, max SUV 16.4. Surrounding ground-glass opacity in the left upper lobe, suggesting lymphangitic carcinomatosis. Additional abnormal soft tissue in the left perihilar region extending into the hilum/mediastinum (series 4/image 66), max SUV 20.7 Associated superior mediastinal and  AP window nodal metastases, measuring 13-15 mm short axis, max SUV 8.6. 8 mm ground-glass nodule in the posterior right upper lobe along the minor fissure (series 8/image 33), previously 6 mm in 2021, non FDG avid. Trace left pleural effusion, unchanged from recent CT, non FDG avid. Incidental CT findings: Atherosclerotic calcifications of the aortic arch. Three vessel coronary atherosclerosis. Postsurgical changes related to prior CABG. ABDOMEN/PELVIS: No abnormal hypermetabolism in the liver, spleen, pancreas, or adrenal glands. No hypermetabolic abdominopelvic lymphadenopathy. Incidental CT findings: 3.7 x 3.8 cm infrarenal abdominal aortic aneurysm. Atherosclerotic calcifications the abdominal aorta and branch vessels. Sigmoid diverticulosis, without evidence of diverticulitis. SKELETON: Metastasis along the right posterior 9th rib, max SUV 6.2. Incidental CT findings: Mild degenerative changes of the lumbar spine. IMPRESSION: 7.6 cm left upper lobe mass, reflecting primary bronchogenic neoplasm. 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 Trace left pleural effusion. Abnormal soft tissue in the left perihilar region extending into the mediastinum. Associated prevascular/AP window nodal metastases. 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 8 mm ground-glass nodule in the posterior right upper lobe, minimally progressive from 2021, non FDG avid. This is not considered suspicious for metastasis. Attention on follow-up is considered sufficient. 3.8 cm infrarenal abdominal aortic aneurysm. Recommend follow-up every 2 years. Reference: J Am Coll Radiol 2706;23:762-831. Electronically Signed   By: Julian Hy M.D.   On: 03/23/2021 22:33   DG Chest Port 1 View  Result Date: 04/10/2021 CLINICAL DATA:  Chest pain and hemoptysis. EXAM: PORTABLE CHEST 1 VIEW COMPARISON:  Chest x-ray 03/05/2021.  CT chest 03/07/2021. FINDINGS: Focal masslike opacity measuring 10 cm in the lateral left upper lobe has  increased in size. There is increasing diffuse airspace disease and central interstitial opacities. Right lung is clear. There is no pleural effusion or pneumothorax. Again seen are postsurgical changes in the left chest. Sternotomy wires are present. Cardiomediastinal silhouette is stable, the heart is enlarged. No acute fractures are identified. IMPRESSION: 1. Enlarging left upper lobe mass. 2. New/increasing diffuse left lung airspace disease with interstitial prominence. Findings may related to infection, pneumonia and/or tumor progression. Electronically Signed   By: Ronney Asters M.D.   On: 04/10/2021 21:42   ECHOCARDIOGRAM COMPLETE  Result Date: 04/11/2021    ECHOCARDIOGRAM REPORT   Patient Name:   Andrea [name] Date of Exam: 04/11/2021 Medical Rec #:  [id number]     Height:       62.0 in Accession #:    [id number]    Weight:       125.0 lb Date of Birth:  August 31, 1940    BSA:          1.566 m Patient Age:    23 years      BP:           146/37 mmHg Patient Gender: F             HR:  74 bpm. Exam Location:  Inpatient Procedure: 2D Echo, Cardiac Doppler and Color Doppler Indications:    Pulmonary Embolus I26.09  History:        Patient has prior history of Echocardiogram examinations, most                 recent 04/24/2020. CHF, CAD and Previous Myocardial Infarction,                 COPD and PAD, Arrythmias:Atrial Fibrillation,                 Signs/Symptoms:Murmur and Shortness of Breath; Risk                 Factors:Sleep Apnea, Hypertension and Dyslipidemia. Chronic                 hypoxic respiratory failure, hypothyroidism, recently identified                 left lung mass, AAA, bradycardia.  Sonographer:    Darlina Sicilian RDCS Referring Phys: [id number] TIMOTHY S OPYD  Sonographer Comments: Image acquisition challenging due to respiratory motion and Image acquisition challenging due to COPD. IMPRESSIONS  1. Left ventricular ejection fraction, by estimation, is 50 to 55%. The left ventricle has  low normal function. The left ventricle demonstrates regional wall motion abnormalities (see scoring diagram/findings for description). Left ventricular diastolic  parameters are indeterminate. There is moderate hypokinesis of the left ventricular, basal-mid inferior wall and inferoseptal wall.  2. Right ventricular systolic function was not well visualized. The right ventricular size is normal. There is mildly elevated pulmonary artery systolic pressure. The estimated right ventricular systolic pressure is 87.8 mmHg.  3. Left atrial size was moderately dilated.  4. Right atrial size was severely dilated.  5. The mitral valve is degenerative. Mild to moderate mitral valve regurgitation. No evidence of mitral stenosis. Moderate mitral annular calcification.  6. Tricuspid valve regurgitation is moderate to severe.  7. The aortic valve is calcified. There is moderate calcification of the aortic valve. There is moderate thickening of the aortic valve. Aortic valve regurgitation is not visualized. Aortic valve sclerosis/calcification is present, without any evidence of aortic stenosis.  8. Mildly dilated pulmonary artery.  9. The inferior vena cava is normal in size with greater than 50% respiratory variability, suggesting right atrial pressure of 3 mmHg. Comparison(s): Prior images reviewed side by side. Conclusion(s)/Recommendation(s): Difficult images on current and prior study. Overall appears largely similar, but on current images there is concern for basal hypokinesis of the inferior and inferoseptal wall. Would recommend future studies use echo contrast to evaluate more completely. FINDINGS  Left Ventricle: Left ventricular ejection fraction, by estimation, is 50 to 55%. The left ventricle has low normal function. The left ventricle demonstrates regional wall motion abnormalities. Moderate hypokinesis of the left ventricular, basal-mid inferior wall and inferoseptal wall. 3D left ventricular ejection fraction  analysis performed but not reported based on interpreter judgement due to suboptimal tracking. The left ventricular internal cavity size was normal in size. There is no left ventricular hypertrophy. Left ventricular diastolic parameters are indeterminate. Right Ventricle: The right ventricular size is normal. Right vetricular wall thickness was not well visualized. Right ventricular systolic function was not well visualized. There is mildly elevated pulmonary artery systolic pressure. The tricuspid regurgitant velocity is 2.98 m/s, and with an assumed right atrial pressure of 3 mmHg, the estimated right ventricular systolic pressure is 67.6 mmHg. Left Atrium: Left atrial size was moderately dilated. Right  Atrium: Right atrial size was severely dilated. Pericardium: There is no evidence of pericardial effusion. Mitral Valve: The mitral valve is degenerative in appearance. There is mild thickening of the mitral valve leaflet(s). There is mild calcification of the mitral valve leaflet(s). Moderate mitral annular calcification. Mild to moderate mitral valve regurgitation. No evidence of mitral valve stenosis. Tricuspid Valve: The tricuspid valve is normal in structure. Tricuspid valve regurgitation is moderate to severe. No evidence of tricuspid stenosis. Aortic Valve: The aortic valve is calcified. There is moderate calcification of the aortic valve. There is moderate thickening of the aortic valve. Aortic valve regurgitation is not visualized. Aortic valve sclerosis/calcification is present, without any  evidence of aortic stenosis. Pulmonic Valve: The pulmonic valve was not well visualized. Pulmonic valve regurgitation is mild. No evidence of pulmonic stenosis. Aorta: The aortic root and ascending aorta are structurally normal, with no evidence of dilitation and the aortic arch was not well visualized. Pulmonary Artery: The pulmonary artery is mildly dilated. Venous: The inferior vena cava is normal in size with  greater than 50% respiratory variability, suggesting right atrial pressure of 3 mmHg. IAS/Shunts: The atrial septum is grossly normal.  LEFT VENTRICLE PLAX 2D LVIDd:         5.10 cm   Diastology LVIDs:         4.30 cm   LV e' medial:    8.81 cm/s LV PW:         1.00 cm   LV E/e' medial:  10.5 LV IVS:        1.00 cm   LV e' lateral:   9.03 cm/s LVOT diam:     1.90 cm   LV E/e' lateral: 10.3 LV SV:         43 LV SV Index:   27 LVOT Area:     2.84 cm                           3D Volume EF:                          3D EF:        42 %                          LV EDV:       146 ml                          LV ESV:       85 ml                          LV SV:        61 ml RIGHT VENTRICLE RV S prime:     8.17 cm/s LEFT ATRIUM             Index        RIGHT ATRIUM           Index LA diam:        5.30 cm 3.39 cm/m   RA Area:     25.80 cm LA Vol (A2C):   47.8 ml 30.53 ml/m  RA Volume:   83.60 ml  53.40 ml/m LA Vol (A4C):   50.8 ml 32.45 ml/m LA Biplane Vol: 49.4 ml 31.55 ml/m  AORTIC VALVE LVOT Vmax:  89.90 cm/s LVOT Vmean:  59.200 cm/s LVOT VTI:    0.151 m  AORTA Ao Root diam: 2.80 cm Ao Asc diam:  3.10 cm MITRAL VALVE                  TRICUSPID VALVE MV Area (PHT): 3.72 cm       TR Peak grad:   35.5 mmHg MV Decel Time: 204 msec       TR Vmax:        298.00 cm/s MR Peak grad:    168.5 mmHg MR Mean grad:    104.0 mmHg   SHUNTS MR Vmax:         649.00 cm/s  Systemic VTI:  0.15 m MR Vmean:        483.0 cm/s   Systemic Diam: 1.90 cm MR PISA:         1.01 cm MR PISA Eff ROA: 13 mm MR PISA Radius:  0.40 cm MV E velocity: 92.70 cm/s MV A velocity: 102.00 cm/s MV E/A ratio:  0.91 Buford Dresser MD Electronically signed by Buford Dresser MD Signature Date/Time: 04/11/2021/1:23:04 PM    Final    VAS [place] LOWER EXTREMITY VENOUS (DVT)  Result Date: 04/11/2021  Lower Venous DVT Study Patient Name:  Andrea [name]  Date of Exam:   04/11/2021 Medical Rec #: [id number]      Accession #:    [id number] Date of Birth:  05/15/1941     Patient Gender: F Patient Age:   67 years Exam Location:  [facility] Procedure:      VAS [place] LOWER EXTREMITY VENOUS (DVT) Referring Phys: TIMOTHY OPYD --------------------------------------------------------------------------------  Indications: Pulmonary embolism.  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 Comparison Study: No prior study Performing Technologist: Sharion Dove RVS  Examination Guidelines: A complete evaluation includes B-mode imaging, spectral Doppler, color Doppler, and power Doppler as needed of all accessible portions of each vessel. Bilateral testing is considered an integral part of a complete examination. Limited examinations for reoccurring indications may be performed as noted. The reflux portion of the exam is performed with the patient in reverse Trendelenburg.  +---------+---------------+---------+-----------+----------+-------------------+ RIGHT    CompressibilityPhasicitySpontaneityPropertiesThrombus Aging      +---------+---------------+---------+-----------+----------+-------------------+ CFV      Full                                         pulsatile waveforms +---------+---------------+---------+-----------+----------+-------------------+ SFJ      Full                                                             +---------+---------------+---------+-----------+----------+-------------------+ FV Prox  Full                                                             +---------+---------------+---------+-----------+----------+-------------------+ FV Mid   Full                                                             +---------+---------------+---------+-----------+----------+-------------------+  FV DistalFull                                                             +---------+---------------+---------+-----------+----------+-------------------+ PFV      Full                                                              +---------+---------------+---------+-----------+----------+-------------------+ POP      Full           No       No                                       +---------+---------------+---------+-----------+----------+-------------------+ PTV      Full                                                             +---------+---------------+---------+-----------+----------+-------------------+ PERO     Full                                                             +---------+---------------+---------+-----------+----------+-------------------+   +---------+---------------+---------+-----------+----------+-------------------+ LEFT     CompressibilityPhasicitySpontaneityPropertiesThrombus Aging      +---------+---------------+---------+-----------+----------+-------------------+ CFV      Full           No       Yes                  pulsatile flow      +---------+---------------+---------+-----------+----------+-------------------+ SFJ      Full                                                             +---------+---------------+---------+-----------+----------+-------------------+ FV Prox  Full                                                             +---------+---------------+---------+-----------+----------+-------------------+ FV Mid   Full                                                             +---------+---------------+---------+-----------+----------+-------------------+ FV DistalFull                                                             +---------+---------------+---------+-----------+----------+-------------------+  PFV      Full                                                             +---------+---------------+---------+-----------+----------+-------------------+ POP      Full                                         Dilated with                                                              rouleaux  flow       +---------+---------------+---------+-----------+----------+-------------------+ PTV      Full                                                             +---------+---------------+---------+-----------+----------+-------------------+ PERO     Full                                                             +---------+---------------+---------+-----------+----------+-------------------+ Gastroc  Full                                         Dilated with                                                              rouleaux flow       +---------+---------------+---------+-----------+----------+-------------------+     Summary: RIGHT: - There is no evidence of deep vein thrombosis in the lower extremity.  LEFT: - There is no evidence of deep vein thrombosis in the lower extremity.  - The popliteal and gastrocnemius veins are easily compressible but are dilated with rouleaux flow  *See table(s) above for measurements and observations. Electronically signed by Orlie Pollen on 04/11/2021 at 7:05:27 PM.    Final     Labs:  CBC: Recent Labs    12/19/20 1200 01/10/21 1427 04/10/21 2122 04/12/21 0418  WBC 9.1 8.3 12.9* 11.0*  HGB 12.6 11.4* 10.5* 9.2*  HCT 39.6 35.7* 35.2* 30.5*  PLT 273 298.0 331 253    COAGS: Recent Labs    04/11/21 1616 04/11/21 2248 04/12/21 0810  APTT >200* 57* 52*    BMP: Recent Labs    06/22/20 1102 12/19/20 1200 03/06/21 1207 04/10/21 2122 04/11/21 0310 04/12/21 0418  NA 143   < >  136 135 132* 132*  K 3.9   < > 3.9 3.8 3.5 4.5  CL 101   < > 99 97* 96* 96*  CO2 26   < > 30 29 27 29   GLUCOSE 84   < > 112* 113* 135* 105*  BUN 9   < > 10 10 10 10   CALCIUM 9.4   < > 9.3 8.5* 8.1* 8.3*  CREATININE 0.78   < > 0.78 0.84 0.75 0.90  GFRNONAA 72  --   --  >60 >60 >60  GFRAA 82  --   --   --   --   --    < > = values in this interval not displayed.    LIVER FUNCTION TESTS: Recent Labs    01/10/21 1427  BILITOT 0.4  AST  11  ALT 9  ALKPHOS 99  PROT 6.7  ALBUMIN 3.7    TUMOR MARKERS: No results for input(s): AFPTM, CEA, CA199, CHROMGRNA in the last 8760 hours.  Assessment and Plan: 80 y.o. 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  Patient recently presented to [facility] with right-sided chest pain along with coughing.  PET scan performed 03/23/2021 revealed:    7.6 cm left upper lobe mass, reflecting primary bronchogenic neoplasm. 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 Trace left pleural effusion.   Abnormal soft tissue in the left perihilar region extending into the mediastinum. Associated prevascular/AP window nodal metastases.   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   8 mm ground-glass nodule in the posterior right upper lobe, minimally progressive from 2021, non FDG avid. This is not considered suspicious for metastasis. Attention on follow-up is considered sufficient.   3.8 cm infrarenal abdominal aortic aneurysm  CT angio chest performed on 11/17 revealed:   1. Left upper lobe bland thrombus versus tumor thrombus. No CT evidence of right heart stranding. 2. Large area of consolidation in the left upper lobe in keeping with known malignancy with associated lymphangitic spread in the left upper lobe. 3. Right posterior ninth rib lytic lesion with associated pathologic fracture. 4. Cardiomegaly with evidence of right heart dysfunction. 5. A 1 cm right upper lobe subpleural nodule. 6. Aortic Atherosclerosis (ICD10-I70.0) and Emphysema   Patient was  evaluated by pulmonology regarding left lung mass biopsy.  They recommend first attempting image guided needle biopsy of lung mass to avoid general anesthesia and if needle biopsy is nondiagnostic then they will proceed with bronchoscopy.  She is currently on IV heparin.  She is off eliquis and plavix. Latest labs include WBC 11, hemoglobin 9.2, platelets normal, creatinine normal, PT/INR pending.  Imaging studies have been reviewed by Dr. Laurence Ferrari.Risks and benefits of procedure was discussed with the patient including, but not limited to bleeding, infection, damage to adjacent structures, pneumothorax requiring chest tube placement , low yield requiring additional tests, and death.  All of the questions were answered and there is agreement to proceed.  Consent signed and in chart. Procedure tent scheduled for 11/21.    Thank you for this interesting consult.  I greatly enjoyed meeting Andrea [name] and look forward to participating in their care.  A copy of this report was sent to the requesting provider on this date.  Electronically Signed: D. Rowe Robert, PA-C 04/12/2021, 1:39 PM   I spent a total of  30 minutes   in face to face in clinical consultation, greater than 50% of which was counseling/coordinating care for CT-guided left lung mass biopsy

## 2021-04-12 NOTE — Progress Notes (Signed)
Initial Nutrition Assessment  DOCUMENTATION CODES:   Non-severe (moderate) malnutrition in context of chronic illness  INTERVENTION:   - Boost Breeze po TID, each supplement provides 250 kcal and 9 grams of protein  - MVI with minerals daily  - Continue Regular diet and encourage PO intake  NUTRITION DIAGNOSIS:   Moderate Malnutrition related to chronic illness (COPD, CHF) as evidenced by moderate fat depletion, severe muscle depletion.  GOAL:   Patient will meet greater than or equal to 90% of their needs  MONITOR:   PO intake, Supplement acceptance, Labs, Weight trends  REASON FOR ASSESSMENT:   Consult Assessment of nutrition requirement/status  ASSESSMENT:   80 year old female who presented to the ED on 11/16 with SOB and chest pain. PMH of COPD with chronic hypoxic respiratory failure, OSA, atrial fibrillation, CAD, CHF, hypothyroidism, recently identified L lung mass. Pt admitted with pulmonary embolism, lytic rib lesion with pathologic fracture.  Noted image-guided needle biopsy of lung mass with IR scheduled for 11/21.  Spoke with pt at bedside; son in room at time of RD visit. Pt reports decreased appetite since COVID infection in January 2022. Pt reports that her appetite got better for a short period of time then worsened again a few months ago. Pt endorses early satiety and taste changes. Pt also endorses intermittent nausea over the last few months but reports that zofran has helped with this.  Pt believes that overall she has lost between 15-20 lbs since January 2022. She believes that the majority of the weight loss has happened more acutely over the last few months. Pt reports a UBW of 150 lbs and states that she lost weight down to 135 lbs after not eating when having COVID. She states that she lost weight further and is now down to 125 lbs.  Reviewed available weight history in chart. Pt with a 6.1 kg weight loss since 04/11/20. This is a 9.3% weight loss in  1 year which is not significant for timeframe but is concerning given pt meets criteria for moderate malnutrition based on NFPE. Pt is at risk for developing more severe malnutrition if PO intake does not improve.  Pt has tried supplements in the past and does not like milky supplements like Ensure. She is willing to try Boost Breeze supplements. RD will also order daily MVI with minerals. Discussed with pt the importance of adequate PO intake in maintaining lean muscle mass and preventing additional weight loss.  Medications reviewed and include: protonix, senna, heparin drip  Labs reviewed: sodium 132, hemoglobin 9.2  NUTRITION - FOCUSED PHYSICAL EXAM:  Flowsheet Row Most Recent Value  Orbital Region Moderate depletion  Upper Arm Region Moderate depletion  Thoracic and Lumbar Region Moderate depletion  Buccal Region Moderate depletion  Temple Region Moderate depletion  Clavicle Bone Region Severe depletion  Clavicle and Acromion Bone Region Severe depletion  Scapular Bone Region Moderate depletion  Dorsal Hand Moderate depletion  Patellar Region Moderate depletion  Anterior Thigh Region Severe depletion  Posterior Calf Region Moderate depletion  Edema (RD Assessment) None  Hair Reviewed  Eyes Reviewed  Mouth Reviewed  Skin Reviewed  Nails Reviewed       Diet Order:   Diet Order             Diet NPO time specified  Diet effective midnight           Diet regular Room service appropriate? Yes; Fluid consistency: Thin  Diet effective now  EDUCATION NEEDS:   Education needs have been addressed  Skin:  Skin Assessment: Reviewed RN Assessment  Last BM:  04/10/21  Height:   Ht Readings from Last 1 Encounters:  04/10/21 5\' 2"  (1.575 m)    Weight:   Wt Readings from Last 1 Encounters:  04/12/21 59.6 kg    BMI:  Body mass index is 24.03 kg/m.  Estimated Nutritional Needs:   Kcal:  1600-1800  Protein:  75-90 grams  Fluid:  1.6-1.8  L    Gustavus Bryant, MS, RD, LDN Inpatient Clinical Dietitian Please see AMiON for contact information.

## 2021-04-13 DIAGNOSIS — E440 Moderate protein-calorie malnutrition: Secondary | ICD-10-CM

## 2021-04-13 DIAGNOSIS — I2699 Other pulmonary embolism without acute cor pulmonale: Secondary | ICD-10-CM | POA: Diagnosis not present

## 2021-04-13 DIAGNOSIS — I482 Chronic atrial fibrillation, unspecified: Secondary | ICD-10-CM | POA: Diagnosis not present

## 2021-04-13 DIAGNOSIS — J9611 Chronic respiratory failure with hypoxia: Secondary | ICD-10-CM | POA: Diagnosis not present

## 2021-04-13 DIAGNOSIS — I25708 Atherosclerosis of coronary artery bypass graft(s), unspecified, with other forms of angina pectoris: Secondary | ICD-10-CM | POA: Diagnosis not present

## 2021-04-13 LAB — HEPARIN LEVEL (UNFRACTIONATED)
Heparin Unfractionated: 0.27 IU/mL — ABNORMAL LOW (ref 0.30–0.70)
Heparin Unfractionated: 0.23 IU/mL — ABNORMAL LOW (ref 0.30–0.70)

## 2021-04-13 LAB — APTT
aPTT: 73 seconds — ABNORMAL HIGH (ref 24–36)
aPTT: 63 seconds — ABNORMAL HIGH (ref 24–36)

## 2021-04-13 NOTE — Progress Notes (Signed)
PROGRESS NOTE    Andrea [name]  DGL:[id number] DOB: [birth year] DOA: 04/10/2021 PCP: Mosie Lukes, MD    Brief Narrative:  Andrea [name] was admitted to the hospital with the working diagnosis of acute pulmonary embolism/ possible tumor thrombus in the setting of left upper lobe lung mass.     80 yo female with the past medical history of COPD, with chronic hypoxemic respiratory failure, OSA, atrial fibrillation, CAD, chronic diastolic heart failure and hypothyroidism, who presented with right sided chest pain.  Patient reported coughing for the last 24 weeks, she has been diagnosed with left upper lobe mass and lytic lesion on the posterior right ninth rib, planned biopsy per pulmonary on 04/15/21.  On 11/16 night she acute onset of severe pain on the right posterior rib cage, worse with coughing and severe enough that she came to the hospital for further evaluation.  On her initial physical examination her blood pressure was 115/66, HR 66 and RR24, oxygen saturation 97% on supplemental 0-2 per [place] 2 L/min.  Her lungs had decreased breath sounds bilaterally but no wheezing, heart S1 and S2 present and rhythmic, abdomen was soft and non tender and no lower extremity edema.    NA 132, K 3,8, Cl 97, bicarbonate 29, glucose 113, BUN 10 and cr 0,84 Wbc 12,9, hgb 10,5, hct 35,2 and plt 331. SARS COVID 19 negative   Chest film with large mass on peripheral left upper lobe. Positive interstitial infiltrates left upper lobe CT chest  left upper lobe with bland thrombus versus tumor thrombus. No right heart stranding.  Large area of consolidation in the left upper lobe with lymphangitic spread.  Right posterior ninth rib lesion with pathologic fracture.  1 cm right upper lobe subpleural nodule.    EKG 88 bpm, right ward axis, normal intervals, sinus rhythm with positive PAC, no significant st segment, negative T wave at the II, III, and Avf.    Patient was placed on IV opioid analgesics for pain  control and supplemental 02 per [place] Heparin drip for anticoagulation and IR consulted for lung biopsy (transthoracic).    Old records personally reviewed, patient under work up for left upper lobe mass, plan for transthoracic biopsy as first step. If negative results 2nd step for bronchoscopy.  Consulted IR for inpatient biopsy, pulmonary team Dr Lamonte Sakai has been notified.    Patient had hypotension 11/18, and required bolus 500 cc isotonic saline with improvement on blood pressure.  Her chest pain is more controlled with analgesics but not back to baseline.   Plan for CT biopsy of lung mass on 04/15/21.   Assessment & Plan:   Principal Problem:   Pulmonary embolism (HCC) Active Problems:   CAD (coronary artery disease)   Hypothyroidism   Chronic respiratory failure with hypoxia (HCC)   Atrial fibrillation, chronic (HCC)   COPD (chronic obstructive pulmonary disease) (HCC)   Chronic diastolic CHF (congestive heart failure) (HCC)   Lung mass   Pathological fracture of one rib   Microcytic anemia   Malnutrition of moderate degree     Acute pulmonary embolism (positive tumor thrombus), in the setting of large left upper lobe mass, complicated with pathologic 9th rib fracture.   Echocardiogram with preserved LV systolic function with EF 50 to 55%, moderate hypokinesis left ventricle based mid inferior wall and inferior septal wall.    Improved pain control but not back to baseline, continue with as needed oxycodone for moderate pain and hydromorphone for severe pain. Scheduled cyclobenzaprine and  acetaminophen.     Plan to transition from IV heparin to oral anticoagulants after lung biopsy, at home patient has been taking apixaban for atrial fibrillation, likely will change agent to rivaroxaban.    2. COPD  No clinical signs of exacerbation. On bronchodilator therapy, fluticasone, vilanterol, umeclidinium  Will add incentive spirometer and flutter valve Antitussive agents.  Continue  to encourage mobility, out of bed to chair tid with meal, follow with physical and occupational therapy.    3. Paroxysmal atrial fibrillation.  Rate controlled with defetilide and diltiazem.  Continue with heparin IV for anticoagulation  4. Chronic diastolic heart failure. No clinical signs of exacerbation. Hypotensive episode last night, blood pressure this am 104/55.  Plan to re-bolus as needed.    5. CAD/ dyslipidemia.  On isosorbide and rosuvastatin.   Holding clopidogrel in preparation for lung biopsy    6. Hypothyroid,  Continue with levothyroxine   7. Hyponatremia/ hypomagnesemia/ moderate calorie protein malnutrition Unrestricted diet  Continue with nutritional supplementation. Follow up renal function and electrolytes in 48 hrs.    8. Depression. Continue with fluoxetine.      Patient continue to be at high risk for worsening PE and pain   Status is: Inpatient  Remains inpatient appropriate because: IV heparin and lung biopsy   DVT prophylaxis: IV heparin   Code Status:    full  Family Communication:   No family at the bedside      Nutrition Status: Nutrition Problem: Moderate Malnutrition Etiology: chronic illness (COPD, CHF) Signs/Symptoms: moderate fat depletion, severe muscle depletion Interventions: Boost Breeze, MVI   Consultants:  IR    Subjective: Patient with no nausea or vomiting, no chest pain or dyspnea, her blood pressure is better this am.   Objective: Vitals:   04/13/21 0329 04/13/21 0543 04/13/21 0730 04/13/21 0830  BP: (!) 94/52 (!) 105/54 (!) 100/51 (!) 104/55  Pulse: 64 63 64 64  Resp: (!) 24 (!) 26 (!) 22 20  Temp: 98.6 F (37 C) 98.2 F (36.8 C) 98.1 F (36.7 C) 98.1 F (36.7 C)  TempSrc: Oral Oral Oral Oral  SpO2: 94% 94% 95% 100%  Weight: 59 kg     Height:        Intake/Output Summary (Last 24 hours) at 04/13/2021 1050 Last data filed at 04/13/2021 0914 Gross per 24 hour  Intake 377 ml  Output 500 ml  Net -123  ml   Filed Weights   04/10/21 2124 04/12/21 0401 04/13/21 0329  Weight: 56.7 kg 59.6 kg 59 kg    Examination:   General: Deconditioned and ill looking appearing   Neurology: Awake and alert, non focal  E ENT: positive pallor, no icterus, oral mucosa moist Cardiovascular: No JVD. S1-S2 present, rhythmic, no gallops, rubs, or murmurs. No lower extremity edema. Pulmonary: positive beath sounds bilaterally, with no wheezing, rhonchi or rales.but decreased breath sound on the left upper zone.  Gastrointestinal. Abdomen soft and non tender Skin. No rashes Musculoskeletal: no joint deformities     Data Reviewed: I have personally reviewed following labs and imaging studies  CBC: Recent Labs  Lab 04/10/21 2122 04/12/21 0418  WBC 12.9* 11.0*  NEUTROABS 11.0*  --   HGB 10.5* 9.2*  HCT 35.2* 30.5*  MCV 76.4* 75.9*  PLT 331 765   Basic Metabolic Panel: Recent Labs  Lab 04/10/21 2122 04/11/21 0310 04/12/21 0418  NA 135 132* 132*  K 3.8 3.5 4.5  CL 97* 96* 96*  CO2 29 27 29  GLUCOSE 113* 135* 105*  BUN 10 10 10   CREATININE 0.84 0.75 0.90  CALCIUM 8.5* 8.1* 8.3*  MG  --  1.8  --    GFR: Estimated Creatinine Clearance: 38.1 mL/min (by C-G formula based on SCr of 0.9 mg/dL). Liver Function Tests: No results for input(s): AST, ALT, ALKPHOS, BILITOT, PROT, ALBUMIN in the last 168 hours. No results for input(s): LIPASE, AMYLASE in the last 168 hours. No results for input(s): AMMONIA in the last 168 hours. Coagulation Profile: No results for input(s): INR, PROTIME in the last 168 hours. Cardiac Enzymes: No results for input(s): CKTOTAL, CKMB, CKMBINDEX, TROPONINI in the last 168 hours. BNP (last 3 results) No results for input(s): PROBNP in the last 8760 hours. HbA1C: No results for input(s): HGBA1C in the last 72 hours. CBG: No results for input(s): GLUCAP in the last 168 hours. Lipid Profile: No results for input(s): CHOL, HDL, LDLCALC, TRIG, CHOLHDL, LDLDIRECT in the  last 72 hours. Thyroid Function Tests: No results for input(s): TSH, T4TOTAL, FREET4, T3FREE, THYROIDAB in the last 72 hours. Anemia Panel: No results for input(s): VITAMINB12, FOLATE, FERRITIN, TIBC, IRON, RETICCTPCT in the last 72 hours.    Radiology Studies: I have reviewed all of the imaging during this hospital visit personally     Scheduled Meds:  cyclobenzaprine  5 mg Oral TID   diltiazem  240 mg Oral Daily   dofetilide  125 mcg Oral BID   feeding supplement  1 Container Oral TID BM   FLUoxetine  40 mg Oral Daily   fluticasone furoate-vilanterol  1 puff Inhalation Daily   And   umeclidinium bromide  1 puff Inhalation Daily   isosorbide mononitrate  60 mg Oral BID   levothyroxine  75 mcg Oral Daily   mouth rinse  15 mL Mouth Rinse BID   multivitamin with minerals  1 tablet Oral Daily   pantoprazole  40 mg Oral Daily   rosuvastatin  40 mg Oral Daily   senna  1 tablet Oral BID   sodium chloride flush  3 mL Intravenous Q12H   Continuous Infusions:  heparin 1,350 Units/hr (04/13/21 0439)     LOS: 1 day        Domitila Stetler Gerome Apley, MD

## 2021-04-13 NOTE — Progress Notes (Signed)
[place] for Heparin Indication: pulmonary embolus  Allergies  Allergen Reactions   Erythromycin Other (See Comments)    Made the patient's tongue "burn"   Tape Rash and Other (See Comments)    PLEASE USE PAPER TAPE!!   Meperidine Nausea And Vomiting   Shellfish Allergy Nausea And Vomiting   Ciprofloxacin Rash and Other (See Comments)    Rash from IV   Codeine Nausea And Vomiting   Latex Rash and Other (See Comments)    Made the patient's skin "burn," also   Penicillins Rash and Other (See Comments)    Has patient had a PCN reaction causing immediate rash, facial/tongue/throat swelling, SOB or lightheadedness with hypotension: Unk Has patient had a PCN reaction causing severe rash involving mucus membranes or skin necrosis: No Has patient had a PCN reaction that required hospitalization: No Has patient had a PCN reaction occurring within the last 10 years: No If all of the above answers are "NO", then may proceed with Cephalosporin use.     Patient Measurements: Height: 5\' 2"  (157.5 cm) Weight: 59 kg (130 lb) IBW/kg (Calculated) : 50.1 Heparin Dosing Weight: 56 kg  Vital Signs: Temp: 98 F (36.7 C) (11/19 1441) Temp Source: Oral (11/19 1441) BP: 93/60 (11/19 1441) Pulse Rate: 73 (11/19 1600)  Labs: Recent Labs    04/10/21 2122 04/11/21 0310 04/11/21 1616 04/12/21 0418 04/12/21 0810 04/12/21 2236 04/13/21 0806 04/13/21 1631  HGB 10.5*  --   --  9.2*  --   --   --   --   HCT 35.2*  --   --  30.5*  --   --   --   --   PLT 331  --   --  253  --   --   --   --   APTT  --   --    < >  --  52* 61* 73* 63*  HEPARINUNFRC  --   --    < >  --  0.36  --  0.27* 0.23*  CREATININE 0.84 0.75  --  0.90  --   --   --   --   TROPONINIHS 10  --   --   --   --   --   --   --    < > = values in this interval not displayed.     Estimated Creatinine Clearance: 38.1 mL/min (by C-G formula based on SCr of 0.9 mg/dL).   Medical  History: Past Medical History:  Diagnosis Date   AAA (abdominal aortic aneurysm) 01/2009   AAA (2.8 x 3.0)  moderate RAS (left); 2.7 x 2.7 cm (07/11/05)   Acute bronchitis 03/20/2013; 2017   Angina    Anosmia    Asthma    Baker's cyst of knee 01/30/2014   Basal cell carcinoma    "back and left arm"   Bradycardia    Metoprolol stopped 08/2011   CAD (coronary artery disease)    s/CABG (reports IMA and 2 SVGs) back in 1999  Myoview normal 3/10; s/p PCI with DES to PL branch of distal RCA 09/2011; PCI +DES to SVG-RCA, PCI + DES to mid LCx 12/2015   Cerebrovascular disease 01/2009   carotid u/s  R 0-39%   L 60-79%   Chronic thoracic back pain    COPD (chronic obstructive pulmonary disease) (HCC)    Depression    Dizziness    Dysrhythmia    hx of sinus  brady   GERD (gastroesophageal reflux disease)    Heart murmur    Herniated lumbar disc without myelopathy    History of blood transfusion 1999   "when I had the bypass; had a PE"   Hyperglycemia 10/23/2015   Hyperlipidemia    Hypertension    Hypothyroidism 09/30/2016   Medicare annual wellness visit, subsequent 07/31/2013   NSTEMI (non-ST elevated myocardial infarction) ([place]) 11/12   Cath showed atretic IMA graft to the LAD, SVG to PD was patent but the continuation of this graft to the PL branch was occluded; there were L to R collaterals; Mid LAD had a 60 to 70% stenosis. She has been treated medically.  Neg Myoview 05/2011   Osteoarthritis of back    Osteoporosis    Pneumonia "several times"   Pulmonary embolism ([place]) 1999   "after my bypass"   PVD (peripheral vascular disease) ([place])    Shortness of breath    Squamous carcinoma    "nose"   Thyroid disease    Hypothyroid   Unstable angina ([place]) 09/25/2015    Medications:  See electronic med rec  Assessment: 80 y.o. F found to have PE on CT scan. Pt on Eliquis PTA for afib - (last dose 11/15 pm). Heparin initiated but switched to enoxaparin 11/17 given PE treatment in active cancer  patient. Decision made to switch back to heparin in preparation for biopsy.  Heparin level came back subtherapeutic at 0.23, aPTT also came back subtherapeutic at 63 - correlating so will monitor heparin level for now on. No s/sx of bleeding or infusion issues per nursing.   Goal of Therapy:  Heparin level 0.3-0.7 units/ml Monitor platelets by anticoagulation protocol: Yes   Plan: Increase heparin infusion to 1450 units/hr  Re-check heparin level in 8 hours with AM labs  Monitor CBC, HL, and s/sx of bleeding daily  Antonietta Jewel, PharmD, [place] Clinical Pharmacist  Phone: [phone] 04/13/2021 6:34 PM  Please check AMION for all [place] phone numbers After 10:00 PM, call [place] [phone]

## 2021-04-13 NOTE — Progress Notes (Signed)
[place] for Heparin Indication: pulmonary embolus  Allergies  Allergen Reactions   Erythromycin Other (See Comments)    Made the patient's tongue "burn"   Tape Rash and Other (See Comments)    PLEASE USE PAPER TAPE!!   Meperidine Nausea And Vomiting   Shellfish Allergy Nausea And Vomiting   Ciprofloxacin Rash and Other (See Comments)    Rash from IV   Codeine Nausea And Vomiting   Latex Rash and Other (See Comments)    Made the patient's skin "burn," also   Penicillins Rash and Other (See Comments)    Has patient had a PCN reaction causing immediate rash, facial/tongue/throat swelling, SOB or lightheadedness with hypotension: Unk Has patient had a PCN reaction causing severe rash involving mucus membranes or skin necrosis: No Has patient had a PCN reaction that required hospitalization: No Has patient had a PCN reaction occurring within the last 10 years: No If all of the above answers are "NO", then may proceed with Cephalosporin use.     Patient Measurements: Height: 5\' 2"  (157.5 cm) Weight: 59 kg (130 lb) IBW/kg (Calculated) : 50.1 Heparin Dosing Weight: 56 kg  Vital Signs: Temp: 98.1 F (36.7 C) (11/19 0830) Temp Source: Oral (11/19 0830) BP: 104/55 (11/19 0830) Pulse Rate: 64 (11/19 0830)  Labs: Recent Labs    04/10/21 2122 04/11/21 0310 04/11/21 1616 04/11/21 2248 04/12/21 0418 04/12/21 0810 04/12/21 2236 04/13/21 0806  HGB 10.5*  --   --   --  9.2*  --   --   --   HCT 35.2*  --   --   --  30.5*  --   --   --   PLT 331  --   --   --  253  --   --   --   APTT  --   --    < > 57*  --  52* 61* 73*  HEPARINUNFRC  --   --    < > 0.49  --  0.36  --  0.27*  CREATININE 0.84 0.75  --   --  0.90  --   --   --   TROPONINIHS 10  --   --   --   --   --   --   --    < > = values in this interval not displayed.     Estimated Creatinine Clearance: 38.1 mL/min (by C-G formula based on SCr of 0.9 mg/dL).   Medical  History: Past Medical History:  Diagnosis Date   AAA (abdominal aortic aneurysm) 01/2009   AAA (2.8 x 3.0)  moderate RAS (left); 2.7 x 2.7 cm (07/11/05)   Acute bronchitis 03/20/2013; 2017   Angina    Anosmia    Asthma    Baker's cyst of knee 01/30/2014   Basal cell carcinoma    "back and left arm"   Bradycardia    Metoprolol stopped 08/2011   CAD (coronary artery disease)    s/CABG (reports IMA and 2 SVGs) back in 1999  Myoview normal 3/10; s/p PCI with DES to PL branch of distal RCA 09/2011; PCI +DES to SVG-RCA, PCI + DES to mid LCx 12/2015   Cerebrovascular disease 01/2009   carotid u/s  R 0-39%   L 60-79%   Chronic thoracic back pain    COPD (chronic obstructive pulmonary disease) (HCC)    Depression    Dizziness    Dysrhythmia    hx of sinus  brady   GERD (gastroesophageal reflux disease)    Heart murmur    Herniated lumbar disc without myelopathy    History of blood transfusion 1999   "when I had the bypass; had a PE"   Hyperglycemia 10/23/2015   Hyperlipidemia    Hypertension    Hypothyroidism 09/30/2016   Medicare annual wellness visit, subsequent 07/31/2013   NSTEMI (non-ST elevated myocardial infarction) ([place]) 11/12   Cath showed atretic IMA graft to the LAD, SVG to PD was patent but the continuation of this graft to the PL branch was occluded; there were L to R collaterals; Mid LAD had a 60 to 70% stenosis. She has been treated medically.  Neg Myoview 05/2011   Osteoarthritis of back    Osteoporosis    Pneumonia "several times"   Pulmonary embolism ([place]) 1999   "after my bypass"   PVD (peripheral vascular disease) ([place])    Shortness of breath    Squamous carcinoma    "nose"   Thyroid disease    Hypothyroid   Unstable angina ([place]) 09/25/2015    Medications:  See electronic med rec  Assessment: 80 y.o. F found to have PE on CT scan. Pt on Eliquis PTA for afib - (last dose 11/15 pm). Heparin initiated but switched to enoxaparin 11/17 given PE treatment in active cancer  patient. Decision made to switch back to heparin in preparation for biopsy.  Baseline aPTT/Heparin level not initially collected.Given time since last DOAC and receipt of enoxaparin this morning will collect baseline coags. Will need to utilize PTT monitoring.  aPTT at lower end of therapeutic range at 73 on 1350 units/hr. aPTT and HL are close to correlating. CBC trending down, but per my conversation with the patient, no s/sx of bleeding noted.  Goal of Therapy:  Heparin level 0.3-0.7 units/ml, aPTT 66-102 sec Monitor platelets by anticoagulation protocol: Yes   Plan: Continue heparin rate at 1350 units/hr Re-check heparin level and aPTT in 8 hours Monitor CBC, HL, and s/sx of bleeding daily  Joseph Art, Pharm.D. PGY-1 Pharmacy Resident [id number]:[phone] 04/13/2021 9:48 AM

## 2021-04-13 NOTE — Progress Notes (Signed)
   04/13/21 0329  Assess: MEWS Score  Temp 98.6 F (37 C)  BP (!) 94/52  Pulse Rate 64  ECG Heart Rate 66  Resp (!) 24  SpO2 94 %  O2 Device Nasal Cannula  Assess: MEWS Score  MEWS Temp 0  MEWS Systolic 1  MEWS Pulse 0  MEWS RR 1  MEWS LOC 0  MEWS Score 2  MEWS Score Color Yellow  Assess: if the MEWS score is Yellow or Red  Were vital signs taken at a resting state? Yes  Focused Assessment No change from prior assessment  Early Detection of Sepsis Score *See Row Information* Low  MEWS guidelines implemented *See Row Information* Yes  Take Vital Signs  Increase Vital Sign Frequency  Yellow: Q 2hr X 2 then Q 4hr X 2, if remains yellow, continue Q 4hrs  Escalate  MEWS: Escalate Yellow: discuss with charge nurse/RN and consider discussing with provider and RRT  Notify: Charge Nurse/RN  Name of Charge Nurse/RN Notified Debra RN  Date Charge Nurse/RN Notified 04/13/21  Time Charge Nurse/RN Notified [phone]  Document  Patient Outcome Not stable and remains on department  Progress note created (see row info) Yes

## 2021-04-13 NOTE — Evaluation (Signed)
Occupational Therapy Evaluation Patient Details Name: Andrea [name] MRN: [id number] DOB: Oct 07, 1940 Today's Date: 04/13/2021   History of Present Illness 80 y.o. female presents to [facility] hospital on 04/10/2021 with R sided chest pain. Pt found to have LUL mass as well as lytic lesion on posterior 9th rib. PT also found to have tumor thrombus, started on anticoagulation. Plan for lung biopsy 11/21. PMH includes COPD, with chronic hypoxemic respiratory failure, OSA, atrial fibrillation, CAD, chronic diastolic heart failure and hypothyroidism.   Clinical Impression   Andrea [name] was indep PTA, she lives in a 2 level home but is able to live on the main level with bed/bath; her granddaughter is coming from [facility] to provide 24/7 assistance and may stay permanently if needed. Upon evaluation pt is now limited by R flank pain, poor activity tolerance, impaired balance and generalized weakness. She requires close min guard for transfers and short ambulation, and up to mod A for lower body ADLs. Pt will continue to benefit from OT acutely. Recommend pt d/c home with 24/7 supervision and [place].     Recommendations for follow up therapy are one component of a multi-disciplinary discharge planning process, led by the attending physician.  Recommendations may be updated based on patient status, additional functional criteria and insurance authorization.   Follow Up Recommendations  Home health OT    Assistance Recommended at Discharge Frequent or constant Supervision/Assistance  Functional Status Assessment  Patient has had a recent decline in their functional status and demonstrates the ability to make significant improvements in function in a reasonable and predictable amount of time.  Equipment Recommendations  BSC/3in1 (rollator)       Precautions / Restrictions Precautions Precautions: Fall Precaution Comments: monitor SpO2 Restrictions Weight Bearing Restrictions: No      Mobility Bed Mobility Overal  bed mobility: Needs Assistance Bed Mobility: Rolling;Sidelying to Sit Rolling: Supervision Sidelying to sit: Supervision;HOB elevated       General bed mobility comments: increased time and use of rails    Transfers Overall transfer level: Needs assistance Equipment used: None Transfers: Sit to/from Stand Sit to Stand: Min guard           General transfer comment: increased time      Balance Overall balance assessment: Needs assistance Sitting-balance support: No upper extremity supported;Feet supported Sitting balance-Leahy Scale: Good     Standing balance support: Single extremity supported;During functional activity Standing balance-Leahy Scale: Poor Standing balance comment: pt required at least 1 UE supported in standing           ADL either performed or assessed with clinical judgement   ADL Overall ADL's : Needs assistance/impaired Eating/Feeding: Independent;Sitting   Grooming: Set up;Sitting   Upper Body Bathing: Supervision/ safety;Sitting Upper Body Bathing Details (indicate cue type and reason): +incrased time Lower Body Bathing: Minimal assistance;Sit to/from stand   Upper Body Dressing : Minimal assistance;Sitting Upper Body Dressing Details (indicate cue type and reason): due to R rib pain Lower Body Dressing: Moderate assistance;Sit to/from stand Lower Body Dressing Details (indicate cue type and reason): to thread BLE Toilet Transfer: Min guard;Ambulation   Toileting- Clothing Manipulation and Hygiene: Supervision/safety;Sitting/lateral lean       Functional mobility during ADLs: Min guard General ADL Comments: pt requires min guard for all OOB tasks for safety, she is limited by poor activity tolerance, R flank pain and generalized weakness     Vision Baseline Vision/History: 0 No visual deficits Ability to See in Adequate Light: 0 Adequate Patient Visual Report: No  change from baseline Vision Assessment?: No apparent visual deficits             Pertinent Vitals/Pain Pain Assessment: Faces Faces Pain Scale: Hurts even more Pain Location: R flank Pain Descriptors / Indicators: Moaning Pain Intervention(s): Monitored during session;Premedicated before session     Hand Dominance Right   Extremity/Trunk Assessment Upper Extremity Assessment Upper Extremity Assessment: Generalized weakness (RUE overhead ROM is limited due to R flank pain)   Lower Extremity Assessment Lower Extremity Assessment: Generalized weakness   Cervical / Trunk Assessment Cervical / Trunk Assessment:  (mild)   Communication Communication Communication: No difficulties   Cognition Arousal/Alertness: Awake/alert Behavior During Therapy: WFL for tasks assessed/performed Overall Cognitive Status: Within Functional Limits for tasks assessed             General Comments  pt on 3L [place] throughout session, SpO2 dropped to 88% during functional task; recovered quickly with sitting rest break     Home Living Family/patient expects to be discharged to:: Private residence Living Arrangements: Other relatives;Children Available Help at Discharge: Family;Available 24 hours/day Type of Home: House Home Access: Stairs to enter [employer] of Steps: 2 Entrance Stairs-Rails: None Home Layout: Two level;Able to live on main level with bedroom/bathroom     Bathroom Shower/Tub: Occupational psychologist: Standard     Home Equipment: None          Prior Functioning/Environment Prior Level of Function : Independent/Modified Independent             Mobility Comments: pt was utilizing 2L [place] prior to admission, independent in all mobility and ADLs ADLs Comments: indep no AD/DME        OT Problem List: Decreased strength;Decreased range of motion;Decreased activity tolerance;Impaired balance (sitting and/or standing);Decreased safety awareness;Decreased knowledge of use of DME or AE;Decreased knowledge of  precautions;Cardiopulmonary status limiting activity;Pain      OT Treatment/Interventions: Self-care/ADL training;Therapeutic exercise;Patient/family education;Balance training;Therapeutic activities    OT Goals(Current goals can be found in the care plan section) Acute Rehab OT Goals Patient Stated Goal: feel better OT Goal Formulation: With patient Time For Goal Achievement: 04/27/21 Potential to Achieve Goals: Fair ADL Goals Pt Will Perform Grooming: with supervision;standing Pt Will Perform Upper Body Dressing: with set-up;sitting Pt Will Perform Lower Body Dressing: with set-up;sit to/from stand Pt Will Transfer to Toilet: with supervision;ambulating Pt Will Perform Toileting - Clothing Manipulation and hygiene: with supervision;sitting/lateral leans Additional ADL Goal #1: pt will indep verbalize at least 3 energy conservation techniques to prepare for safe d/c home  OT Frequency: Min 2X/week    AM-PAC OT "6 Clicks" Daily Activity     Outcome Measure Help from another person eating meals?: None Help from another person taking care of personal grooming?: A Little Help from another person toileting, which includes using toliet, bedpan, or urinal?: A Little Help from another person bathing (including washing, rinsing, drying)?: A Little Help from another person to put on and taking off regular upper body clothing?: A Little Help from another person to put on and taking off regular lower body clothing?: A Lot 6 Click Score: 18   End of Session Equipment Utilized During Treatment: Oxygen Nurse Communication: Mobility status  Activity Tolerance: Patient tolerated treatment well;Patient limited by pain Patient left: in bed;with call bell/phone within reach;with bed alarm set  OT Visit Diagnosis: Other abnormalities of gait and mobility (R26.89);Muscle weakness (generalized) (M62.81);Pain  Time: 3582-5189 OT Time Calculation (min): 20 min Charges:  OT General  Charges $OT Visit: 1 Visit OT Evaluation $OT Eval Moderate Complexity: 1 Mod   Gennie Dib A Mckenzy Salazar 04/13/2021, 2:30 PM

## 2021-04-14 ENCOUNTER — Inpatient Hospital Stay (HOSPITAL_COMMUNITY): Payer: Medicare HMO

## 2021-04-14 ENCOUNTER — Other Ambulatory Visit (HOSPITAL_COMMUNITY): Payer: Medicare HMO

## 2021-04-14 DIAGNOSIS — I25708 Atherosclerosis of coronary artery bypass graft(s), unspecified, with other forms of angina pectoris: Secondary | ICD-10-CM | POA: Diagnosis not present

## 2021-04-14 DIAGNOSIS — I482 Chronic atrial fibrillation, unspecified: Secondary | ICD-10-CM | POA: Diagnosis not present

## 2021-04-14 DIAGNOSIS — I2699 Other pulmonary embolism without acute cor pulmonale: Secondary | ICD-10-CM | POA: Diagnosis not present

## 2021-04-14 DIAGNOSIS — I5032 Chronic diastolic (congestive) heart failure: Secondary | ICD-10-CM | POA: Diagnosis not present

## 2021-04-14 LAB — HEPARIN LEVEL (UNFRACTIONATED)
Heparin Unfractionated: 0.27 IU/mL — ABNORMAL LOW (ref 0.30–0.70)
Heparin Unfractionated: 0.24 IU/mL — ABNORMAL LOW (ref 0.30–0.70)

## 2021-04-14 LAB — CBC
HCT: 28 % — ABNORMAL LOW (ref 36.0–46.0)
Hemoglobin: 8.6 g/dL — ABNORMAL LOW (ref 12.0–15.0)
MCH: 23 pg — ABNORMAL LOW (ref 26.0–34.0)
MCHC: 30.7 g/dL (ref 30.0–36.0)
MCV: 74.9 fL — ABNORMAL LOW (ref 80.0–100.0)
Platelets: 226 10*3/uL (ref 150–400)
RBC: 3.74 MIL/uL — ABNORMAL LOW (ref 3.87–5.11)
RDW: 18.2 % — ABNORMAL HIGH (ref 11.5–15.5)
WBC: 9.8 10*3/uL (ref 4.0–10.5)
nRBC: 0 % (ref 0.0–0.2)

## 2021-04-14 LAB — MAGNESIUM: Magnesium: 2 mg/dL (ref 1.7–2.4)

## 2021-04-14 MED ORDER — IPRATROPIUM-ALBUTEROL 0.5-2.5 (3) MG/3ML IN SOLN
3.0000 mL | RESPIRATORY_TRACT | Status: DC | PRN
  Administered 2021-04-15 – 2021-04-24 (×7): 3 mL via RESPIRATORY_TRACT
  Filled 2021-04-14 (×6): qty 3

## 2021-04-14 MED ORDER — IPRATROPIUM-ALBUTEROL 0.5-2.5 (3) MG/3ML IN SOLN
3.0000 mL | Freq: Four times a day (QID) | RESPIRATORY_TRACT | Status: DC
Start: 2021-04-14 — End: 2021-04-14
  Administered 2021-04-14 (×2): 3 mL via RESPIRATORY_TRACT
  Filled 2021-04-14: qty 3

## 2021-04-14 MED ORDER — SODIUM CHLORIDE 0.9 % IV SOLN
1.0000 g | INTRAVENOUS | Status: AC
  Administered 2021-04-14 – 2021-04-23 (×10): 1 g via INTRAVENOUS
  Filled 2021-04-14 (×10): qty 10

## 2021-04-14 MED ORDER — IPRATROPIUM-ALBUTEROL 0.5-2.5 (3) MG/3ML IN SOLN
3.0000 mL | RESPIRATORY_TRACT | Status: DC
Start: 2021-04-15 — End: 2021-04-15
  Administered 2021-04-14 – 2021-04-15 (×3): 3 mL via RESPIRATORY_TRACT
  Filled 2021-04-14 (×3): qty 3

## 2021-04-14 MED ORDER — WHITE PETROLATUM EX OINT
TOPICAL_OINTMENT | CUTANEOUS | Status: AC
  Filled 2021-04-14: qty 28.35

## 2021-04-14 MED ORDER — METHYLPREDNISOLONE SODIUM SUCC 40 MG IJ SOLR
40.0000 mg | INTRAMUSCULAR | Status: DC
  Administered 2021-04-14 – 2021-04-24 (×11): 40 mg via INTRAVENOUS
  Filled 2021-04-14 (×11): qty 1

## 2021-04-14 NOTE — Progress Notes (Signed)
0645, Pt with strong cough, increased work of breathing with wheezes and rhonchii.  O2 sats 80% on 2.5 L with coughing episode, gave PRN albuterol treatment and increased O2 4L [place] with sats 90% for recovery.  Pt states improvement with nebulizer.  Dr. Marlyce Huge notified and requested PCXR.  CXR ordered and report given to oncoming RN, Rey.  Pt in no acute distress at bedside rounds.

## 2021-04-14 NOTE — Progress Notes (Signed)
[place] for Heparin Indication: pulmonary embolus  Allergies  Allergen Reactions   Erythromycin Other (See Comments)    Made the patient's tongue "burn"   Tape Rash and Other (See Comments)    PLEASE USE PAPER TAPE!!   Meperidine Nausea And Vomiting   Shellfish Allergy Nausea And Vomiting   Ciprofloxacin Rash and Other (See Comments)    Rash from IV   Codeine Nausea And Vomiting   Latex Rash and Other (See Comments)    Made the patient's skin "burn," also   Penicillins Rash and Other (See Comments)    Has patient had a PCN reaction causing immediate rash, facial/tongue/throat swelling, SOB or lightheadedness with hypotension: Unk Has patient had a PCN reaction causing severe rash involving mucus membranes or skin necrosis: No Has patient had a PCN reaction that required hospitalization: No Has patient had a PCN reaction occurring within the last 10 years: No If all of the above answers are "NO", then may proceed with Cephalosporin use.     Patient Measurements: Height: 5\' 2"  (157.5 cm) Weight: 57.7 kg (127 lb 4.8 oz) IBW/kg (Calculated) : 50.1 Heparin Dosing Weight: 56 kg  Vital Signs: Temp: 98 F (36.7 C) (11/20 0540) Temp Source: Oral (11/20 0540) BP: 94/60 (11/20 0540) Pulse Rate: 77 (11/20 0540)  Labs: Recent Labs    04/12/21 0418 04/12/21 0810 04/12/21 2236 04/13/21 0806 04/13/21 1631 04/14/21 0150  HGB 9.2*  --   --   --   --  8.6*  HCT 30.5*  --   --   --   --  28.0*  PLT 253  --   --   --   --  226  APTT  --    < > 61* 73* 63*  --   HEPARINUNFRC  --    < >  --  0.27* 0.23* 0.27*  CREATININE 0.90  --   --   --   --   --    < > = values in this interval not displayed.     Estimated Creatinine Clearance: 38.1 mL/min (by C-G formula based on SCr of 0.9 mg/dL).   Medical History: Past Medical History:  Diagnosis Date   AAA (abdominal aortic aneurysm) 01/2009   AAA (2.8 x 3.0)  moderate RAS (left); 2.7 x 2.7 cm  (07/11/05)   Acute bronchitis 03/20/2013; 2017   Angina    Anosmia    Asthma    Baker's cyst of knee 01/30/2014   Basal cell carcinoma    "back and left arm"   Bradycardia    Metoprolol stopped 08/2011   CAD (coronary artery disease)    s/CABG (reports IMA and 2 SVGs) back in 1999  Myoview normal 3/10; s/p PCI with DES to PL branch of distal RCA 09/2011; PCI +DES to SVG-RCA, PCI + DES to mid LCx 12/2015   Cerebrovascular disease 01/2009   carotid u/s  R 0-39%   L 60-79%   Chronic thoracic back pain    COPD (chronic obstructive pulmonary disease) (HCC)    Depression    Dizziness    Dysrhythmia    hx of sinus brady   GERD (gastroesophageal reflux disease)    Heart murmur    Herniated lumbar disc without myelopathy    History of blood transfusion 1999   "when I had the bypass; had a PE"   Hyperglycemia 10/23/2015   Hyperlipidemia    Hypertension    Hypothyroidism 09/30/2016  Medicare annual wellness visit, subsequent 07/31/2013   NSTEMI (non-ST elevated myocardial infarction) ([place]) 11/12   Cath showed atretic IMA graft to the LAD, SVG to PD was patent but the continuation of this graft to the PL branch was occluded; there were L to R collaterals; Mid LAD had a 60 to 70% stenosis. She has been treated medically.  Neg Myoview 05/2011   Osteoarthritis of back    Osteoporosis    Pneumonia "several times"   Pulmonary embolism ([place]) 1999   "after my bypass"   PVD (peripheral vascular disease) ([place])    Shortness of breath    Squamous carcinoma    "nose"   Thyroid disease    Hypothyroid   Unstable angina ([place]) 09/25/2015    Medications:  See electronic med rec  Assessment: 80 y.o. F found to have PE on CT scan. Pt on Eliquis PTA for afib - (last dose 11/15 pm). Heparin initiated but switched to enoxaparin 11/17 given PE treatment in active cancer patient. Decision made to switch back to heparin in preparation for biopsy.  Heparin level continues to be subtherapeutic at 0.27 on 1450  units/hr. Hgb has trended down from admission (10.5>9.2>8.6). No s/sx of bleeding noted.  Goal of Therapy:  Heparin level 0.3-0.7 units/ml Monitor platelets by anticoagulation protocol: Yes   Plan: Increase heparin infusion to 1550 units/hr  Re-check heparin level in 8 hours Monitor CBC, HL, and s/sx of bleeding daily Transition to PO anticoagulation after lung biopsy  Joseph Art, Pharm.D. PGY-1 Pharmacy Resident [phone] 04/14/2021 7:17 AM

## 2021-04-14 NOTE — Progress Notes (Addendum)
PROGRESS NOTE    Andrea [name]  ION:[id number] DOB: 01-Aug-1940 DOA: 04/10/2021 PCP: Mosie Lukes, MD    Brief Narrative:  Andrea [name] was admitted to the hospital with the working diagnosis of acute pulmonary embolism/ possible tumor thrombus in the setting of left upper lobe lung mass.   Complicated with acute on chronic hypoxemic respiratory failure, post obstructive pneumonia/ COPD exacerbation   80 yo female with the past medical history of COPD, with chronic hypoxemic respiratory failure, OSA, atrial fibrillation, CAD, chronic diastolic heart failure and hypothyroidism, who presented with right sided chest pain.  Patient reported coughing for the last 24 weeks, she has been diagnosed with left upper lobe mass and lytic lesion on the posterior right ninth rib, planned biopsy per pulmonary on 04/15/21.  On 11/16 night she acute onset of severe pain on the right posterior rib cage, worse with coughing and severe enough that she came to the hospital for further evaluation.  On her initial physical examination her blood pressure was 115/66, HR 66 and RR24, oxygen saturation 97% on supplemental 0-2 per [place] 2 L/min.  Her lungs had decreased breath sounds bilaterally but no wheezing, heart S1 and S2 present and rhythmic, abdomen was soft and non tender and no lower extremity edema.    NA 132, K 3,8, Cl 97, bicarbonate 29, glucose 113, BUN 10 and cr 0,84 Wbc 12,9, hgb 10,5, hct 35,2 and plt 331. SARS COVID 19 negative   Chest film with large mass on peripheral left upper lobe. Positive interstitial infiltrates left upper lobe CT chest  left upper lobe with bland thrombus versus tumor thrombus. No right heart stranding.  Large area of consolidation in the left upper lobe with lymphangitic spread.  Right posterior ninth rib lesion with pathologic fracture.  1 cm right upper lobe subpleural nodule.    EKG 88 bpm, right ward axis, normal intervals, sinus rhythm with positive PAC, no  significant st segment, negative T wave at the II, III, and Avf.    Patient was placed on IV opioid analgesics for pain control and supplemental 02 per [place] Heparin drip for anticoagulation and IR consulted for lung biopsy (transthoracic).    Old records personally reviewed, patient under work up for left upper lobe mass, plan for transthoracic biopsy as first step. If negative results 2nd step for bronchoscopy.  Consulted IR for inpatient biopsy, pulmonary team Dr Lamonte Sakai has been notified.    Patient had hypotension 11/18, and required bolus 500 cc isotonic saline with improvement on blood pressure.  Her chest pain is more controlled with analgesics but not back to baseline.    Plan for CT biopsy of lung mass on 04/15/21.   Patient with worsening dyspnea, increase congestion and sputum production. Last night respiratory distress and increased in oxygen requirements.   11/20 worsening opacity of left lung upper and lower lobe. Post obstructive pneumonia and COPD exacerbation.   Assessment & Plan:   Principal Problem:   Pulmonary embolism (HCC) Active Problems:   CAD (coronary artery disease)   Hypothyroidism   Chronic respiratory failure with hypoxia (HCC)   Atrial fibrillation, chronic (HCC)   COPD (chronic obstructive pulmonary disease) (HCC)   Chronic diastolic CHF (congestive heart failure) (HCC)   Lung mass   Pathological fracture of one rib   Microcytic anemia   Malnutrition of moderate degree     Acute pulmonary embolism (positive tumor thrombus), in the setting of large left upper lobe mass, complicated with pathologic 9th rib fracture.  Echocardiogram with preserved LV systolic function with EF 50 to 55%, moderate hypokinesis left ventricle based mid inferior wall and inferior septal wall.    Improved in pain control continue with oxycodone for moderate pain and hydromorphone for severe pain.  TID cyclobenzaprine and QID acetaminophen.     Continue anticoagulation  with IV heparin and plan to transition to apixaban or enoxaparin, patient may need Port access for eventual chemotherapy.    2. COPD with acute exacerbation, post obstructive pneumonia with acute on chronic hypoxemic respiratory failure.  Post obstructive pneumonia.  Worsening dyspnea and increase oxygen requirements At the time of my examination patient has dyspnea with minimal efforts but not in respiratory distress, her oxygenation is 92% on 4 L/min per [place]  Add systemic steroids and antibiotic therapy with ceftriaxone. Continue with aggressive bronchodilator therapy with duoneb and inhaled corticosteroids.  Incentive spirometer and flutter valve, chest PT Continue with antitussive agents.     3. Paroxysmal atrial fibrillation.  On defetilide and diltiazem.  Heparin IV for anticoagulation   4. Chronic diastolic heart failure. No clinical signs of acute exacerbation. No signs of volume overload.    5. CAD/ dyslipidemia.  Continue with isosorbide and rosuvastatin.    Continue to hold on clopidogrel in preparation for lung biopsy    6. Hypothyroid,  Continue with levothyroxine   7. Hyponatremia/ hypomagnesemia/ moderate calorie protein malnutrition  Follow up renal function and electrolytes in am.  Continue with nutritional supplements.    8. Depression. On fluoxetine.    9. Anemia of chronic disease. Hgb is 8,6 and Hct at 28,0 Plt 226   Patient continue to be at high risk for worsening respiratory failure   Status is: Inpatient  Remains inpatient appropriate because: IV antibiotics, IV steroids, supplemental 02 and oxymetry, IV heparin    DVT prophylaxis: IV heparin   Code Status:    DNR (ok for ventilator if needed for respiratory failure)  Family Communication:   I spoke over the phone with the patient's son about patient's  condition, plan of care, prognosis and all questions were addressed.    Nutrition Status: Nutrition Problem: Moderate Malnutrition Etiology:  chronic illness (COPD, CHF) Signs/Symptoms: moderate fat depletion, severe muscle depletion Interventions: [place], MVI   Consultants:  IR   Antimicrobials:  Ceftriaxone     Subjective: Patient with worsening dyspnea last night, today mild improvement but not back to baseline, continue to have cough, congestion, increase sputum and dyspnea worse with minimal efforts.   Objective: Vitals:   04/13/21 1600 04/13/21 2040 04/14/21 0540 04/14/21 0926  BP:  (!) 106/56 94/60 114/66  Pulse: 73 73 77   Resp:  19 17   Temp:  98 F (36.7 C) 98 F (36.7 C)   TempSrc:  Oral Oral   SpO2: 95% 94% 99%   Weight:   57.7 kg   Height:        Intake/Output Summary (Last 24 hours) at 04/14/2021 1251 Last data filed at 04/14/2021 0732 Gross per 24 hour  Intake 868.56 ml  Output 650 ml  Net 218.56 ml   Filed Weights   04/12/21 0401 04/13/21 0329 04/14/21 0540  Weight: 59.6 kg 59 kg 57.7 kg    Examination:   General: positive dyspnea at rest, deconditioned and ill looking appearing  Neurology: Awake and alert, non focal  E ENT: mild pallor, no icterus, oral mucosa moist Cardiovascular: No JVD. S1-S2 present, rhythmic, no gallops, rubs, or murmurs. No lower extremity edema. Pulmonary: positive  breath sounds bilaterally, with no wheezing, diffuse bilateral rhonchi and rales. Gastrointestinal. Abdomen soft and non tender Skin. No rashes Musculoskeletal: no joint deformities   Data Reviewed: I have personally reviewed following labs and imaging studies  CBC: Recent Labs  Lab 04/10/21 2122 04/12/21 0418 04/14/21 0150  WBC 12.9* 11.0* 9.8  NEUTROABS 11.0*  --   --   HGB 10.5* 9.2* 8.6*  HCT 35.2* 30.5* 28.0*  MCV 76.4* 75.9* 74.9*  PLT 331 253 498   Basic Metabolic Panel: Recent Labs  Lab 04/10/21 2122 04/11/21 0310 04/12/21 0418 04/14/21 0150  NA 135 132* 132*  --   K 3.8 3.5 4.5  --   CL 97* 96* 96*  --   CO2 29 27 29   --   GLUCOSE 113* 135* 105*  --   BUN 10 10  10   --   CREATININE 0.84 0.75 0.90  --   CALCIUM 8.5* 8.1* 8.3*  --   MG  --  1.8  --  2.0   GFR: Estimated Creatinine Clearance: 38.1 mL/min (by C-G formula based on SCr of 0.9 mg/dL). Liver Function Tests: No results for input(s): AST, ALT, ALKPHOS, BILITOT, PROT, ALBUMIN in the last 168 hours. No results for input(s): LIPASE, AMYLASE in the last 168 hours. No results for input(s): AMMONIA in the last 168 hours. Coagulation Profile: No results for input(s): INR, PROTIME in the last 168 hours. Cardiac Enzymes: No results for input(s): CKTOTAL, CKMB, CKMBINDEX, TROPONINI in the last 168 hours. BNP (last 3 results) No results for input(s): PROBNP in the last 8760 hours. HbA1C: No results for input(s): HGBA1C in the last 72 hours. CBG: No results for input(s): GLUCAP in the last 168 hours. Lipid Profile: No results for input(s): CHOL, HDL, LDLCALC, TRIG, CHOLHDL, LDLDIRECT in the last 72 hours. Thyroid Function Tests: No results for input(s): TSH, T4TOTAL, FREET4, T3FREE, THYROIDAB in the last 72 hours. Anemia Panel: No results for input(s): VITAMINB12, FOLATE, FERRITIN, TIBC, IRON, RETICCTPCT in the last 72 hours.    Radiology Studies: I have reviewed all of the imaging during this hospital visit personally     Scheduled Meds:  cyclobenzaprine  5 mg Oral TID   diltiazem  240 mg Oral Daily   dofetilide  125 mcg Oral BID   feeding supplement  1 Container Oral TID BM   FLUoxetine  40 mg Oral Daily   fluticasone furoate-vilanterol  1 puff Inhalation Daily   And   umeclidinium bromide  1 puff Inhalation Daily   isosorbide mononitrate  60 mg Oral BID   levothyroxine  75 mcg Oral Daily   mouth rinse  15 mL Mouth Rinse BID   multivitamin with minerals  1 tablet Oral Daily   pantoprazole  40 mg Oral Daily   rosuvastatin  40 mg Oral Daily   senna  1 tablet Oral BID   sodium chloride flush  3 mL Intravenous Q12H   Continuous Infusions:  heparin 1,550 Units/hr (04/14/21  0732)     LOS: 2 days        Dannelle Rhymes Gerome Apley, MD

## 2021-04-14 NOTE — Progress Notes (Signed)
[place] for Heparin Indication: pulmonary embolus  Allergies  Allergen Reactions   Erythromycin Other (See Comments)    Made the patient's tongue "burn"   Tape Rash and Other (See Comments)    PLEASE USE PAPER TAPE!!   Meperidine Nausea And Vomiting   Shellfish Allergy Nausea And Vomiting   Ciprofloxacin Rash and Other (See Comments)    Rash from IV   Codeine Nausea And Vomiting   Latex Rash and Other (See Comments)    Made the patient's skin "burn," also   Penicillins Rash and Other (See Comments)    Has patient had a PCN reaction causing immediate rash, facial/tongue/throat swelling, SOB or lightheadedness with hypotension: Unk Has patient had a PCN reaction causing severe rash involving mucus membranes or skin necrosis: No Has patient had a PCN reaction that required hospitalization: No Has patient had a PCN reaction occurring within the last 10 years: No If all of the above answers are "NO", then may proceed with Cephalosporin use.     Patient Measurements: Height: 5\' 2"  (157.5 cm) Weight: 57.7 kg (127 lb 4.8 oz) IBW/kg (Calculated) : 50.1 Heparin Dosing Weight: 56 kg  Vital Signs: Temp: 98.1 F (36.7 C) (11/20 1400) Temp Source: Tympanic (11/20 1400) BP: 118/59 (11/20 1400) Pulse Rate: 71 (11/20 1400)  Labs: Recent Labs    04/12/21 0418 04/12/21 0810 04/12/21 2236 04/13/21 0806 04/13/21 1631 04/14/21 0150 04/14/21 1526  HGB 9.2*  --   --   --   --  8.6*  --   HCT 30.5*  --   --   --   --  28.0*  --   PLT 253  --   --   --   --  226  --   APTT  --    < > 61* 73* 63*  --   --   HEPARINUNFRC  --    < >  --  0.27* 0.23* 0.27* 0.24*  CREATININE 0.90  --   --   --   --   --   --    < > = values in this interval not displayed.     Estimated Creatinine Clearance: 38.1 mL/min (by C-G formula based on SCr of 0.9 mg/dL).   Medical History: Past Medical History:  Diagnosis Date   AAA (abdominal aortic aneurysm) 01/2009    AAA (2.8 x 3.0)  moderate RAS (left); 2.7 x 2.7 cm (07/11/05)   Acute bronchitis 03/20/2013; 2017   Angina    Anosmia    Asthma    Baker's cyst of knee 01/30/2014   Basal cell carcinoma    "back and left arm"   Bradycardia    Metoprolol stopped 08/2011   CAD (coronary artery disease)    s/CABG (reports IMA and 2 SVGs) back in 1999  Myoview normal 3/10; s/p PCI with DES to PL branch of distal RCA 09/2011; PCI +DES to SVG-RCA, PCI + DES to mid LCx 12/2015   Cerebrovascular disease 01/2009   carotid u/s  R 0-39%   L 60-79%   Chronic thoracic back pain    COPD (chronic obstructive pulmonary disease) (HCC)    Depression    Dizziness    Dysrhythmia    hx of sinus brady   GERD (gastroesophageal reflux disease)    Heart murmur    Herniated lumbar disc without myelopathy    History of blood transfusion 1999   "when I had the bypass; had a PE"  Hyperglycemia 10/23/2015   Hyperlipidemia    Hypertension    Hypothyroidism 09/30/2016   Medicare annual wellness visit, subsequent 07/31/2013   NSTEMI (non-ST elevated myocardial infarction) ([place]) 11/12   Cath showed atretic IMA graft to the LAD, SVG to PD was patent but the continuation of this graft to the PL branch was occluded; there were L to R collaterals; Mid LAD had a 60 to 70% stenosis. She has been treated medically.  Neg Myoview 05/2011   Osteoarthritis of back    Osteoporosis    Pneumonia "several times"   Pulmonary embolism ([place]) 1999   "after my bypass"   PVD (peripheral vascular disease) ([place])    Shortness of breath    Squamous carcinoma    "nose"   Thyroid disease    Hypothyroid   Unstable angina ([place]) 09/25/2015    Medications:  See electronic med rec  Assessment: 80 y.o. F found to have PE on CT scan. Pt on Eliquis PTA for afib - (last dose 11/15 pm). Heparin initiated but switched to enoxaparin 11/17 given PE treatment in active cancer patient. Decision made to switch back to heparin in preparation for biopsy.  Heparin level  remains subtherapeutic at 0.24 (despite rate increases), now on 1550 units/hr. Hgb has trended down from admission (10.5>9.2>8.6). No s/sx of bleeding or infusion issues per nursing. Levels drawn appropriately and no signs of line infiltrating.   Goal of Therapy:  Heparin level 0.3-0.7 units/ml Monitor platelets by anticoagulation protocol: Yes   Plan: Increase heparin infusion to 1650 units/hr  Re-check heparin level in 8 hours Monitor CBC, HL, and s/sx of bleeding daily Transition to PO anticoagulation after lung biopsy  Antonietta Jewel, PharmD, [place] Pharmacist  Phone: [phone] 04/14/2021 4:48 PM  Please check AMION for all [place] phone numbers After 10:00 PM, call [place] [phone]

## 2021-04-14 NOTE — Progress Notes (Signed)
HOSPITAL MEDICINE OVERNIGHT EVENT NOTE    Notified by nursing that patient has been having increasing wheezing, wet sounding cough throughout the evening. O2 requirements have increased somewhat as well, now on 4lpm via [place], up from 2.5L via [place] yesterday saturating at 90%.    Patient has been administered a PRN nebulized bronchodilator with improvement in symptoms and is not in respiratory distress currently.    Chart reviewed,  patient has a Hx of COPD and chronic respiratory failure, now found to have RUL mass with lytic lesion of right 9th rib and pulmonary embolism.    Considering increasing O2 requirement, will start with getting a stat CXR, ask incoming day provider to follow up.  Vernelle Emerald  MD [facility]

## 2021-04-15 ENCOUNTER — Inpatient Hospital Stay (HOSPITAL_COMMUNITY): Payer: Medicare HMO

## 2021-04-15 ENCOUNTER — Other Ambulatory Visit: Payer: Medicare HMO

## 2021-04-15 DIAGNOSIS — I25708 Atherosclerosis of coronary artery bypass graft(s), unspecified, with other forms of angina pectoris: Secondary | ICD-10-CM | POA: Diagnosis not present

## 2021-04-15 DIAGNOSIS — J9611 Chronic respiratory failure with hypoxia: Secondary | ICD-10-CM | POA: Diagnosis not present

## 2021-04-15 DIAGNOSIS — I482 Chronic atrial fibrillation, unspecified: Secondary | ICD-10-CM | POA: Diagnosis not present

## 2021-04-15 DIAGNOSIS — I2699 Other pulmonary embolism without acute cor pulmonale: Secondary | ICD-10-CM | POA: Diagnosis not present

## 2021-04-15 LAB — CBC WITH DIFFERENTIAL/PLATELET
Abs Immature Granulocytes: 0.03 10*3/uL (ref 0.00–0.07)
Basophils Absolute: 0 10*3/uL (ref 0.0–0.1)
Basophils Relative: 0 %
Eosinophils Absolute: 0 10*3/uL (ref 0.0–0.5)
Eosinophils Relative: 0 %
HCT: 29.9 % — ABNORMAL LOW (ref 36.0–46.0)
Hemoglobin: 9 g/dL — ABNORMAL LOW (ref 12.0–15.0)
Immature Granulocytes: 0 %
Lymphocytes Relative: 2 %
Lymphs Abs: 0.2 10*3/uL — ABNORMAL LOW (ref 0.7–4.0)
MCH: 22.7 pg — ABNORMAL LOW (ref 26.0–34.0)
MCHC: 30.1 g/dL (ref 30.0–36.0)
MCV: 75.3 fL — ABNORMAL LOW (ref 80.0–100.0)
Monocytes Absolute: 0.1 10*3/uL (ref 0.1–1.0)
Monocytes Relative: 1 %
Neutro Abs: 8.4 10*3/uL — ABNORMAL HIGH (ref 1.7–7.7)
Neutrophils Relative %: 97 %
Platelets: 231 10*3/uL (ref 150–400)
RBC: 3.97 MIL/uL (ref 3.87–5.11)
RDW: 18.4 % — ABNORMAL HIGH (ref 11.5–15.5)
WBC: 8.7 10*3/uL (ref 4.0–10.5)
nRBC: 0 % (ref 0.0–0.2)

## 2021-04-15 LAB — BASIC METABOLIC PANEL
Anion gap: 8 (ref 5–15)
BUN: 10 mg/dL (ref 8–23)
CO2: 30 mmol/L (ref 22–32)
Calcium: 8.5 mg/dL — ABNORMAL LOW (ref 8.9–10.3)
Chloride: 93 mmol/L — ABNORMAL LOW (ref 98–111)
Creatinine, Ser: 0.74 mg/dL (ref 0.44–1.00)
GFR, Estimated: >60 mL/min (ref >60)
Glucose, Bld: 165 mg/dL — ABNORMAL HIGH (ref 70–99)
Potassium: 4 mmol/L (ref 3.5–5.1)
Sodium: 131 mmol/L — ABNORMAL LOW (ref 135–145)

## 2021-04-15 LAB — HEPARIN LEVEL (UNFRACTIONATED): Heparin Unfractionated: 0.27 IU/mL — ABNORMAL LOW (ref 0.30–0.70)

## 2021-04-15 LAB — PROTIME-INR
INR: 1.2 (ref 0.8–1.2)
Prothrombin Time: 15.1 seconds (ref 11.4–15.2)

## 2021-04-15 MED ORDER — HEPARIN (PORCINE) 25000 UT/250ML-% IV SOLN: 1750.0000 [IU]/h | INTRAVENOUS | Status: DC

## 2021-04-15 MED ORDER — MIDAZOLAM HCL 2 MG/2ML IJ SOLN
INTRAMUSCULAR | Status: DC | PRN
  Administered 2021-04-15: 1 mg via INTRAVENOUS

## 2021-04-15 MED ORDER — FENTANYL CITRATE (PF) 100 MCG/2ML IJ SOLN
INTRAMUSCULAR | Status: DC | PRN
  Administered 2021-04-15: 25 ug via INTRAVENOUS

## 2021-04-15 MED ORDER — IPRATROPIUM-ALBUTEROL 0.5-2.5 (3) MG/3ML IN SOLN
3.0000 mL | Freq: Four times a day (QID) | RESPIRATORY_TRACT | Status: DC
  Administered 2021-04-15 – 2021-04-20 (×18): 3 mL via RESPIRATORY_TRACT
  Filled 2021-04-15 (×20): qty 3

## 2021-04-15 MED ORDER — FENTANYL CITRATE (PF) 100 MCG/2ML IJ SOLN
INTRAMUSCULAR | Status: AC
  Filled 2021-04-15: qty 2

## 2021-04-15 MED ORDER — MIDAZOLAM HCL 2 MG/2ML IJ SOLN
INTRAMUSCULAR | Status: AC
  Filled 2021-04-15: qty 2

## 2021-04-15 MED ORDER — WHITE PETROLATUM EX OINT
TOPICAL_OINTMENT | CUTANEOUS | Status: AC
  Filled 2021-04-15: qty 28.35

## 2021-04-15 MED ORDER — LIDOCAINE HCL 1 % IJ SOLN
INTRAMUSCULAR | Status: AC
  Filled 2021-04-15: qty 10

## 2021-04-15 NOTE — Progress Notes (Signed)
Heparin placed on hold per IR  ( Patty ) for Biopsy. Pharmacist on floor updated. Heparin level d/c'ed .

## 2021-04-15 NOTE — Progress Notes (Signed)
[place] for Heparin Indication: pulmonary embolus  Allergies  Allergen Reactions   Erythromycin Other (See Comments)    Made the patient's tongue "burn"   Tape Rash and Other (See Comments)    PLEASE USE PAPER TAPE!!   Meperidine Nausea And Vomiting   Shellfish Allergy Nausea And Vomiting   Ciprofloxacin Rash and Other (See Comments)    Rash from IV   Codeine Nausea And Vomiting   Latex Rash and Other (See Comments)    Made the patient's skin "burn," also   Penicillins Rash and Other (See Comments)    Has patient had a PCN reaction causing immediate rash, facial/tongue/throat swelling, SOB or lightheadedness with hypotension: Unk Has patient had a PCN reaction causing severe rash involving mucus membranes or skin necrosis: No Has patient had a PCN reaction that required hospitalization: No Has patient had a PCN reaction occurring within the last 10 years: No If all of the above answers are "NO", then may proceed with Cephalosporin use.     Patient Measurements: Height: 5\' 2"  (157.5 cm) Weight: 57.6 kg (126 lb 15.8 oz) IBW/kg (Calculated) : 50.1 Heparin Dosing Weight: 56 kg  Vital Signs: Temp: 97.9 F (36.6 C) (11/21 0406) Temp Source: Oral (11/21 0406) BP: 114/64 (11/21 1445) Pulse Rate: 68 (11/21 1445)  Labs: Recent Labs    04/12/21 2236 04/13/21 0806 04/13/21 0806 04/13/21 1631 04/14/21 0150 04/14/21 1526 04/15/21 0039  HGB  --   --   --   --  8.6*  --  9.0*  HCT  --   --   --   --  28.0*  --  29.9*  PLT  --   --   --   --  226  --  231  APTT 61* 73*  --  63*  --   --   --   LABPROT  --   --   --   --   --   --  15.1  INR  --   --   --   --   --   --  1.2  HEPARINUNFRC  --  0.27*   < > 0.23* 0.27* 0.24* 0.27*  CREATININE  --   --   --   --   --   --  0.74   < > = values in this interval not displayed.     Estimated Creatinine Clearance: 42.9 mL/min (by C-G formula based on SCr of 0.74 mg/dL).   Medical  History: Past Medical History:  Diagnosis Date   AAA (abdominal aortic aneurysm) 01/2009   AAA (2.8 x 3.0)  moderate RAS (left); 2.7 x 2.7 cm (07/11/05)   Acute bronchitis 03/20/2013; 2017   Angina    Anosmia    Asthma    Baker's cyst of knee 01/30/2014   Basal cell carcinoma    "back and left arm"   Bradycardia    Metoprolol stopped 08/2011   CAD (coronary artery disease)    s/CABG (reports IMA and 2 SVGs) back in 1999  Myoview normal 3/10; s/p PCI with DES to PL branch of distal RCA 09/2011; PCI +DES to SVG-RCA, PCI + DES to mid LCx 12/2015   Cerebrovascular disease 01/2009   carotid u/s  R 0-39%   L 60-79%   Chronic thoracic back pain    COPD (chronic obstructive pulmonary disease) (HCC)    Depression    Dizziness    Dysrhythmia    hx of  sinus brady   GERD (gastroesophageal reflux disease)    Heart murmur    Herniated lumbar disc without myelopathy    History of blood transfusion 1999   "when I had the bypass; had a PE"   Hyperglycemia 10/23/2015   Hyperlipidemia    Hypertension    Hypothyroidism 09/30/2016   Medicare annual wellness visit, subsequent 07/31/2013   NSTEMI (non-ST elevated myocardial infarction) ([place]) 11/12   Cath showed atretic IMA graft to the LAD, SVG to PD was patent but the continuation of this graft to the PL branch was occluded; there were L to R collaterals; Mid LAD had a 60 to 70% stenosis. She has been treated medically.  Neg Myoview 05/2011   Osteoarthritis of back    Osteoporosis    Pneumonia "several times"   Pulmonary embolism ([place]) 1999   "after my bypass"   PVD (peripheral vascular disease) ([place])    Shortness of breath    Squamous carcinoma    "nose"   Thyroid disease    Hypothyroid   Unstable angina ([place]) 09/25/2015    Medications:  See electronic med rec  Assessment: 80 y.o. F found to have PE on CT scan. Pt on Eliquis PTA for afib - (last dose 11/15 pm). Heparin initiated but switched to enoxaparin 11/17 given PE treatment in active cancer  patient. Decision made to switch back to heparin in preparation for biopsy.  Heparin level low overnight. Hgb has trended down from admission (10.5>9.2>8.6>9). No s/sx of bleeding or infusion issues per nursing. Heparin turned off this morning for biopsy. Orders received from IR to resume in 12 hours.   Goal of Therapy:  Heparin level 0.3-0.7 units/ml Monitor platelets by anticoagulation protocol: Yes   Plan: Restart heparin at 1750 units/hr in am 8 hour heparin level after restart  Erin Hearing PharmD., BCPS Clinical Pharmacist 04/15/2021 2:55 PM

## 2021-04-15 NOTE — Sedation Documentation (Signed)
Patient in CT 3 for CT guided lung bx for left sided lung mass. 10 min into procedure, Dr. Dwaine Gale states "there is blood coming from needle site and that she would have episode of hemoptysis. Patient started coughing up blood and was quickly suctioned. Sats on 5 liters nasal cannula were mid 90s and dropped into 70s. Patient was notably cyanotic at this time. 15 Liters non-rebreather placed on patient and patient turned left side down. Sats increasing at this time. Rapid response notifiied of event.

## 2021-04-15 NOTE — Sedation Documentation (Signed)
Will transport pt back to radiology nurses station for further monitoring.

## 2021-04-15 NOTE — Sedation Documentation (Signed)
Bx site is clean, dry and intact.

## 2021-04-15 NOTE — TOC Initial Note (Signed)
Transition of Care [facility]) - Initial/Assessment Note    Patient Details  Name: Andrea [name] MRN: [id number] Date of Birth: [birth year]  Transition of Care [facility] CM/SW Contact:    Bethena Roys, RN Phone Number: 04/15/2021, 12:51 PM  Clinical Narrative:  Case Manager spoke with the patient regarding disposition needs. Daughter and granddaughter at the bedside and all are agreeable to home health services. Patient has no preference for home health agency. Case Manager called [facility] and the agency can accept the patient for services. Start of care to begin within 24-48 hours post transition home. Patient will need PT/OT orders and F2F once stable to transition home.                  Expected Discharge Plan: [place] Barriers to Discharge: No Barriers Identified   Patient Goals and CMS Choice Patient states their goals for this hospitalization and ongoing recovery are:: to return home with granddaughter   Choice offered to / list presented to : NA (Patient did not have a preference for home health agency.)  Expected Discharge Plan and Services Expected Discharge Plan: [place] In-house Referral: NA Discharge Planning Services: CM Consult Post Acute Care Choice: [place] arrangements for the past 2 months: Single Family Home                 DME Arranged: N/A DME Agency: NA       HH Arranged: PT, OT HH Agency: [place] Date [place]: 04/15/21 Time HH Agency Contacted: 1242 Representative spoke with at [place]: Tommi Rumps  Prior Living Arrangements/Services Living arrangements for the past 2 months: [place] Lives with:: Self, Relatives Patient language and need for interpreter reviewed:: Yes Do you feel safe going back to the place where you live?: Yes      Need for Family Participation in Patient Care: Yes (Comment) Care giver support system in place?: Yes (comment)   Criminal  Activity/Legal Involvement Pertinent to Current Situation/Hospitalization: No - Comment as needed  Activities of Daily Living Home Assistive Devices/Equipment: Oxygen ADL Screening (condition at time of admission) Patient's cognitive ability adequate to safely complete daily activities?: Yes Is the patient deaf or have difficulty hearing?: No Does the patient have difficulty seeing, even when wearing glasses/contacts?: No Does the patient have difficulty concentrating, remembering, or making decisions?: No Patient able to express need for assistance with ADLs?: Yes Does the patient have difficulty dressing or bathing?: No Independently performs ADLs?: Yes (appropriate for developmental age) Does the patient have difficulty walking or climbing stairs?: Yes Weakness of Legs: Both Weakness of Arms/Hands: Both  Permission Sought/Granted Permission sought to share information with : Facility Sport and exercise psychologist, Case Manager, Family Supports Permission granted to share information with : Yes, Verbal Permission Granted     Permission granted to share info w AGENCY: [facility]        Emotional Assessment Appearance:: Appears stated age Attitude/Demeanor/Rapport: Engaged Affect (typically observed): Appropriate Orientation: : Oriented to Situation, Oriented to  Time, Oriented to Place, Oriented to Self Alcohol / Substance Use: Not Applicable Psych Involvement: No (comment)  Admission diagnosis:  Lung mass [R91.8] Pulmonary embolism ([employer]) [I26.99] Other acute pulmonary embolism, unspecified whether acute cor pulmonale present ([place]) [I26.99] Patient Active Problem List   Diagnosis Date Noted   Malnutrition of moderate degree 04/13/2021   Pulmonary embolism ([place]) 04/11/2021   Pathological fracture of one rib 04/11/2021   Microcytic anemia 04/11/2021  Lung mass 03/05/2021   Secondary hypercoagulable state ([place]) 10/07/2019   Cor pulmonale, chronic (HCC) 08/17/2019   Chronic diastolic  CHF (congestive heart failure) ([place]) 08/17/2019   COPD (chronic obstructive pulmonary disease) ([place]) 08/14/2019   Chronic respiratory failure with hypoxia ([place]) 08/12/2019   Atrial fibrillation, chronic (HCC) 08/12/2019   OSA (obstructive sleep apnea) 08/12/2019   Vitamin D deficiency 06/13/2019   Witnessed episode of apnea 03/07/2018   Cervical cancer screening 12/09/2017   Preventative health care 12/09/2017   Chronic ethmoidal sinusitis 09/04/2017   Hypothyroidism 09/30/2016   Presbycusis of both ears 08/05/2016   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   Hyperglycemia 10/23/2015   Unstable angina (HCC) 09/25/2015   Upper airway cough syndrome 03/23/2015   Cystitis 04/11/2014   Baker's cyst of knee 01/30/2014   Thoracic back pain 01/24/2014   Breast cancer screening 07/31/2013   Dyspnea 05/09/2013   Anosmia    Fatigue 09/17/2011   CAD (coronary artery disease) 06/06/2011   NSTEMI (non-ST elevated myocardial infarction) ([address]) 04/22/2011   Excessive somnolence disorder 12/03/2010   BACK PAIN, LUMBAR 07/16/2010   SINUSITIS, CHRONIC 05/07/2010   Cough 09/10/2009   SKIN CANCER, HX OF 04/03/2009   Coronary artery disease involving coronary bypass graft of native heart with angina pectoris ([place]) 01/24/2009   Carotid artery stenosis 01/24/2009   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   AORTIC ANEUR [address] WITHOUT MENTION RUPTURE 01/24/2009   Hyperlipidemia 12/22/2006   Depression, recurrent ([place]) 12/22/2006   HTN (hypertension) 12/22/2006   PERIPHERAL VASCULAR DISEASE 12/22/2006   GERD 12/22/2006   Osteoporosis 12/22/2006   PCP:  Mosie Lukes, MD Pharmacy:   [facility] [place] [address] Phone: [phone] Fax: [phone]  Readmission Risk Interventions Readmission Risk Prevention Plan 04/15/2021  Transportation Screening Complete  PCP or Specialist Appt within 3-5 Days Complete  HRI or Home Care Consult Complete  Social Work Consult for [place] Planning/Counseling Complete  Palliative Care Screening Not Applicable  Medication Review Press photographer) Complete  Some recent data might be hidden

## 2021-04-15 NOTE — Procedures (Signed)
Interventional Radiology Procedure Note  Procedure: Left lung mass biopsy with CT guidance  Indication: Left lung mass  Findings: Please refer to procedural dictation for full description.  Complications: None  EBL: < 10 mL  Miachel Roux, MD [phone]

## 2021-04-15 NOTE — Significant Event (Signed)
Rapid Response Event Note   Reason for Call :  Episode of coughing and hemoptysis Lung sounds crackles, O2 desat  Initial Focused Assessment:  She is still a little diaphoretic She is lying on her Left side Lung sounds clear on right, diminished on left She is alert and Oriented  BP 141/83  99/49 SR 70s RR 22 O2 sat 94% on NRB  Interventions:  Turned to Left Placed on NRB PCXR   Plan of Care:  Continue lying on Left side as tolerated Wean O2 as tolerated Pain Control   Event Summary:   MD Notified: Dr Delphia Grates Call Time: 1854 Arrival Time: 1857 End Time: Edrick Kins, RN

## 2021-04-15 NOTE — Sedation Documentation (Signed)
Bx site is unremarkable at this time. No drainage noted.

## 2021-04-15 NOTE — Sedation Documentation (Signed)
Rapid response called.

## 2021-04-15 NOTE — Sedation Documentation (Signed)
Patient is resting comfortably. 

## 2021-04-15 NOTE — Progress Notes (Signed)
Follow up to previous call: While rounding, I went to reassess Andrea [name] and found her with oxygen saturations of 80% (well correlated pleth) on 10L [place] HFNC. I placed her back to NRB mask at 15L and saturations came back up to 94-95%. Pt is not c/o any SOB or increased WOB at this time. Will re-attempt to wean O2 as tolerated.

## 2021-04-15 NOTE — Progress Notes (Addendum)
Called into the room by pt daughter stating pt had kept on coughing  non stop  and is now in distress. Arrived into the room and had noted pt ashen colored and diaphoretic. O2 saturation at 81%. Pt struggling to breath . Bilateral crackles initially noted on both lungs. RRT called for assist, charge RN called for Assistance. Dr. Delphia Grates called and updated with episode with new orders for Stat Chest Xray. Keep pt on 100 % non rebreather. Pt O2 saturation had stabilized at 96 % . BP  taken at 141/83 .Pt can have Morphine sulfate IV if needed for comfort per MD. Pt and family updated on the plans. Pt kept on her L side  per RRT instruction. Above report give to incoming HS RN .

## 2021-04-15 NOTE — Significant Event (Signed)
Rapid Response Event Note   Reason for Call :  Hemoptysis and O2 sats 70s  Initial Focused Assessment:  Upon my arrival patient is lying on her left side.  She is alert.  She is breathing easily, good skin color.  Lung sounds decreased on Left.  Heart tones occasionally irregular.  BP 102/59  HR 79  RR 26  O2 sat 94% on NRB     Interventions:  Imaging done per MD Weaned O2 to 6L [place]  O2 sats 91% when moving to patient's bed her O2 sats dropped to 85% Replaced on NRB,  O2 sats recovered to 95% quickly. Transported to IR nursing station for additional observation  Plan of Care:  Wean O2 as tolerated back to 4L (pre-procedure O2 needs) If patient stable with no additional hemoptysis transport back to PCU  RN to notify attending (Dr Cathlean Sauer) of events during biopsy    Event Summary:   MD Notified: Dr Mir at bedside Call Time: [place] Time: 0102 End Time: [place]  Raliegh Ip, RN

## 2021-04-15 NOTE — Sedation Documentation (Signed)
Pt reports spasms in her chest. MD at bedside. RR RN at bedside. Pt moved back to bed safely. LL decub position per MD

## 2021-04-15 NOTE — Progress Notes (Signed)
[place] for Heparin Indication: pulmonary embolus  Allergies  Allergen Reactions   Erythromycin Other (See Comments)    Made the patient's tongue "burn"   Tape Rash and Other (See Comments)    PLEASE USE PAPER TAPE!!   Meperidine Nausea And Vomiting   Shellfish Allergy Nausea And Vomiting   Ciprofloxacin Rash and Other (See Comments)    Rash from IV   Codeine Nausea And Vomiting   Latex Rash and Other (See Comments)    Made the patient's skin "burn," also   Penicillins Rash and Other (See Comments)    Has patient had a PCN reaction causing immediate rash, facial/tongue/throat swelling, SOB or lightheadedness with hypotension: Unk Has patient had a PCN reaction causing severe rash involving mucus membranes or skin necrosis: No Has patient had a PCN reaction that required hospitalization: No Has patient had a PCN reaction occurring within the last 10 years: No If all of the above answers are "NO", then may proceed with Cephalosporin use.     Patient Measurements: Height: 5\' 2"  (157.5 cm) Weight: 57.7 kg (127 lb 4.8 oz) IBW/kg (Calculated) : 50.1 Heparin Dosing Weight: 56 kg  Vital Signs: Temp: 97.9 F (36.6 C) (11/20 1949) Temp Source: Oral (11/20 1949) BP: 125/57 (11/20 1949) Pulse Rate: 69 (11/20 1949)  Labs: Recent Labs    04/12/21 0418 04/12/21 0810 04/12/21 2236 04/13/21 0806 04/13/21 1631 04/14/21 0150 04/14/21 1526 04/15/21 0039  HGB 9.2*  --   --   --   --  8.6*  --  9.0*  HCT 30.5*  --   --   --   --  28.0*  --  29.9*  PLT 253  --   --   --   --  226  --  231  APTT  --    < > 61* 73* 63*  --   --   --   HEPARINUNFRC  --    < >  --  0.27* 0.23* 0.27* 0.24* 0.27*  CREATININE 0.90  --   --   --   --   --   --  0.74   < > = values in this interval not displayed.     Estimated Creatinine Clearance: 42.9 mL/min (by C-G formula based on SCr of 0.74 mg/dL).   Medical History: Past Medical History:  Diagnosis Date    AAA (abdominal aortic aneurysm) 01/2009   AAA (2.8 x 3.0)  moderate RAS (left); 2.7 x 2.7 cm (07/11/05)   Acute bronchitis 03/20/2013; 2017   Angina    Anosmia    Asthma    Baker's cyst of knee 01/30/2014   Basal cell carcinoma    "back and left arm"   Bradycardia    Metoprolol stopped 08/2011   CAD (coronary artery disease)    s/CABG (reports IMA and 2 SVGs) back in 1999  Myoview normal 3/10; s/p PCI with DES to PL branch of distal RCA 09/2011; PCI +DES to SVG-RCA, PCI + DES to mid LCx 12/2015   Cerebrovascular disease 01/2009   carotid u/s  R 0-39%   L 60-79%   Chronic thoracic back pain    COPD (chronic obstructive pulmonary disease) (HCC)    Depression    Dizziness    Dysrhythmia    hx of sinus brady   GERD (gastroesophageal reflux disease)    Heart murmur    Herniated lumbar disc without myelopathy    History of blood transfusion 1999   "  when I had the bypass; had a PE"   Hyperglycemia 10/23/2015   Hyperlipidemia    Hypertension    Hypothyroidism 09/30/2016   Medicare annual wellness visit, subsequent 07/31/2013   NSTEMI (non-ST elevated myocardial infarction) ([place]) 11/12   Cath showed atretic IMA graft to the LAD, SVG to PD was patent but the continuation of this graft to the PL branch was occluded; there were L to R collaterals; Mid LAD had a 60 to 70% stenosis. She has been treated medically.  Neg Myoview 05/2011   Osteoarthritis of back    Osteoporosis    Pneumonia "several times"   Pulmonary embolism ([place]) 1999   "after my bypass"   PVD (peripheral vascular disease) ([place])    Shortness of breath    Squamous carcinoma    "nose"   Thyroid disease    Hypothyroid   Unstable angina ([place]) 09/25/2015    Medications:  See electronic med rec  Assessment: 80 y.o. F found to have PE on CT scan. Pt on Eliquis PTA for afib - (last dose 11/15 pm). Heparin initiated but switched to enoxaparin 11/17 given PE treatment in active cancer patient. Decision made to switch back to heparin in  preparation for biopsy.  Heparin level remains subtherapeutic at 0.24 (despite rate increases), now on 1550 units/hr. Hgb has trended down from admission (10.5>9.2>8.6). No s/sx of bleeding or infusion issues per nursing. Levels drawn appropriately and no signs of line infiltrating.   11/21 AM update:  Heparin level low but trending up  Goal of Therapy:  Heparin level 0.3-0.7 units/ml Monitor platelets by anticoagulation protocol: Yes   Plan: Inc heparin to 1750 units/hr 1000 heparin level  Narda Bonds, PharmD, BCPS Clinical Pharmacist Phone: [phone]

## 2021-04-15 NOTE — Sedation Documentation (Signed)
RR RN in room assessing pt at this time

## 2021-04-15 NOTE — Progress Notes (Signed)
PROGRESS NOTE    Andrea [name]  WJX:[id number] DOB: 07-21-1940 DOA: 04/10/2021 PCP: Mosie Lukes, Andrea [name]    Brief Narrative:  Andrea [name] was admitted to the hospital with the working diagnosis of acute pulmonary embolism/ possible tumor thrombus in the setting of left upper lobe lung mass.   Complicated with acute on chronic hypoxemic respiratory failure, post obstructive pneumonia/ COPD exacerbation   80 yo female with the past medical history of COPD, with chronic hypoxemic respiratory failure, OSA, atrial fibrillation, CAD, chronic diastolic heart failure and hypothyroidism, who presented with right sided chest pain.  Patient reported coughing for the last 24 weeks, she has been diagnosed with left upper lobe mass and lytic lesion on the posterior right ninth rib, planned biopsy per pulmonary on 04/15/21.  On 11/16 night she acute onset of severe pain on the right posterior rib cage, worse with coughing and severe enough that she came to the hospital for further evaluation.  On her initial physical examination her blood pressure was 115/66, HR 66 and RR24, oxygen saturation 97% on supplemental 0-2 per [place] 2 L/min.  Her lungs had decreased breath sounds bilaterally but no wheezing, heart S1 and S2 present and rhythmic, abdomen was soft and non tender and no lower extremity edema.    NA 132, K 3,8, Cl 97, bicarbonate 29, glucose 113, BUN 10 and cr 0,84 Wbc 12,9, hgb 10,5, hct 35,2 and plt 331. SARS COVID 19 negative   Chest film with large mass on peripheral left upper lobe. Positive interstitial infiltrates left upper lobe CT chest  left upper lobe with bland thrombus versus tumor thrombus. No right heart stranding.  Large area of consolidation in the left upper lobe with lymphangitic spread.  Right posterior ninth rib lesion with pathologic fracture.  1 cm right upper lobe subpleural nodule.    EKG 88 bpm, right ward axis, normal intervals, sinus rhythm with positive PAC, no  significant st segment, negative T wave at the II, III, and Avf.    Patient was placed on IV opioid analgesics for pain control and supplemental 02 per [facility] Heparin drip for anticoagulation and IR consulted for lung biopsy (transthoracic).    Old records personally reviewed, patient under work up for left upper lobe mass, plan for transthoracic biopsy as first step. If negative results 2nd step for bronchoscopy.  Consulted IR for inpatient biopsy, pulmonary team Dr Lamonte Sakai has been notified.    Patient had hypotension 11/18, and required bolus 500 cc isotonic saline with improvement on blood pressure.  Her chest pain is more controlled with analgesics but not back to baseline.    Plan for CT biopsy of lung mass on 04/15/21.    Patient with worsening dyspnea, increase congestion and sputum production. Last night respiratory distress and increased in oxygen requirements.    11/20 worsening opacity of left lung upper and lower lobe. Post obstructive pneumonia and COPD exacerbation.   11/21 lung biopsy, post procedure she had hemoptysis and oxygen desaturation, required non re-breather mask then able to wean to nasal cannula.    Assessment & Plan:   Principal Problem:   Pulmonary embolism (HCC) Active Problems:   CAD (coronary artery disease)   Hypothyroidism   Chronic respiratory failure with hypoxia (HCC)   Atrial fibrillation, chronic (HCC)   COPD (chronic obstructive pulmonary disease) (HCC)   Chronic diastolic CHF (congestive heart failure) (HCC)   Lung mass   Pathological fracture of one rib   Microcytic anemia   Malnutrition of  moderate degree   Acute pulmonary embolism (positive tumor thrombus), in the setting of large left upper lobe mass, complicated with pathologic 9th rib fracture.   Echocardiogram with preserved LV systolic function with EF 50 to 55%, moderate hypokinesis left ventricle based mid inferior wall and inferior septal wall.    Because hemoptysis and oxygen  desaturation will hold on heparin today, and plan for possible resumption in am if no signs of bleeding. Continue oxymetry monitoring, keep oxygenation 88% or greater.  Continue pain control with oxycodone, hydromorphone and acetaminophen.    2. COPD with acute exacerbation, post obstructive pneumonia with acute on chronic hypoxemic respiratory failure.  Post obstructive pneumonia.  Patient had non re breather mask post procedure, now is back on nasal cannula 4 L/min with oxygen saturation 90%   Continue with systemic steroids and antibiotic therapy with ceftriaxone. Aggressive bronchodilator therapy with duoneb and inhaled corticosteroids.  Continue antitussive therapy and airway clearing techniques with Incentive spirometer and flutter valve,  Chest PT as tolerated.     3. Paroxysmal atrial fibrillation.  Rate control with dofetilide and diltiazem.  Holding anticoagulation for today.    4. Chronic diastolic heart failure.  No signs of volume overload.    5. CAD/ dyslipidemia.  On isosorbide and rosuvastatin.    Patient continue to be at high risk for worsening respiratory failure   Status is: Inpatient  Remains inpatient appropriate because: respiratory monitoring    DVT prophylaxis: Heparin on hold   Code Status:    DNR and DNI  Family Communication:  I spoke with patient's daughter at the bedside, we talked in detail about patient's condition, plan of care and prognosis and all questions were addressed.   We talk about Andrea [name] being very weak and high risk for worsening respiratory failure. We agree in the case of worsening respiratory failure invasive mechanical ventilation will likely prolong suffering and inflict worsening pain. Patient agree with DNR and DNI.     Nutrition Status: Nutrition Problem: Moderate Malnutrition Etiology: chronic illness (COPD, CHF) Signs/Symptoms: moderate fat depletion, severe muscle depletion Interventions: Boost Breeze, MVI      Consultants:  IR   Procedures:  Lung biopsy   Antimicrobials:  Ceftriaxone     Subjective: Patient very weak and deconditioned positive dyspnea and fatigue, chest pain is controlled.   Objective: Vitals:   04/15/21 0354 04/15/21 0406 04/15/21 0758 04/15/21 0900  BP:  110/61  (!) 126/59  Pulse: 72 74 65   Resp: 20 20 18    Temp:  97.9 F (36.6 C)    TempSrc:  Oral    SpO2: 96% 96% 90%   Weight:  57.6 kg    Height:        Intake/Output Summary (Last 24 hours) at 04/15/2021 1316 Last data filed at 04/15/2021 0400 Gross per 24 hour  Intake 428.28 ml  Output 1300 ml  Net -871.72 ml   Filed Weights   04/13/21 0329 04/14/21 0540 04/15/21 0406  Weight: 59 kg 57.7 kg 57.6 kg    Examination:   General: Not in pain  deconditioned and ill looking appearing, positive dyspnea at rest  Neurology: Awake and alert, non focal  E ENT: mild pallor, no icterus, oral mucosa moist Cardiovascular: No JVD. S1-S2 present, rhythmic, no gallops, rubs, or murmurs. No lower extremity edema. Pulmonary: positive breath sounds bilaterally, with decreased breath sounds bilaterally with no wheezing, poor inspiratory effort, scattered rales.  Gastrointestinal. Abdomen soft and non tender Skin. No rashes Musculoskeletal: no  joint deformities     Data Reviewed: I have personally reviewed following labs and imaging studies  CBC: Recent Labs  Lab 04/10/21 2122 04/12/21 0418 04/14/21 0150 04/15/21 0039  WBC 12.9* 11.0* 9.8 8.7  NEUTROABS 11.0*  --   --  8.4*  HGB 10.5* 9.2* 8.6* 9.0*  HCT 35.2* 30.5* 28.0* 29.9*  MCV 76.4* 75.9* 74.9* 75.3*  PLT 331 253 226 694   Basic Metabolic Panel: Recent Labs  Lab 04/10/21 2122 04/11/21 0310 04/12/21 0418 04/14/21 0150 04/15/21 0039  NA 135 132* 132*  --  131*  K 3.8 3.5 4.5  --  4.0  CL 97* 96* 96*  --  93*  CO2 29 27 29   --  30  GLUCOSE 113* 135* 105*  --  165*  BUN 10 10 10   --  10  CREATININE 0.84 0.75 0.90  --  0.74  CALCIUM  8.5* 8.1* 8.3*  --  8.5*  MG  --  1.8  --  2.0  --    GFR: Estimated Creatinine Clearance: 42.9 mL/min (by C-G formula based on SCr of 0.74 mg/dL). Liver Function Tests: No results for input(s): AST, ALT, ALKPHOS, BILITOT, PROT, ALBUMIN in the last 168 hours. No results for input(s): LIPASE, AMYLASE in the last 168 hours. No results for input(s): AMMONIA in the last 168 hours. Coagulation Profile: Recent Labs  Lab 04/15/21 0039  INR 1.2   Cardiac Enzymes: No results for input(s): CKTOTAL, CKMB, CKMBINDEX, TROPONINI in the last 168 hours. BNP (last 3 results) No results for input(s): PROBNP in the last 8760 hours. HbA1C: No results for input(s): HGBA1C in the last 72 hours. CBG: No results for input(s): GLUCAP in the last 168 hours. Lipid Profile: No results for input(s): CHOL, HDL, LDLCALC, TRIG, CHOLHDL, LDLDIRECT in the last 72 hours. Thyroid Function Tests: No results for input(s): TSH, T4TOTAL, FREET4, T3FREE, THYROIDAB in the last 72 hours. Anemia Panel: No results for input(s): VITAMINB12, FOLATE, FERRITIN, TIBC, IRON, RETICCTPCT in the last 72 hours.    Radiology Studies: I have reviewed all of the imaging during this hospital visit personally     Scheduled Meds:  cyclobenzaprine  5 mg Oral TID   diltiazem  240 mg Oral Daily   dofetilide  125 mcg Oral BID   feeding supplement  1 Container Oral TID BM   FLUoxetine  40 mg Oral Daily   ipratropium-albuterol  3 mL Nebulization Q6H   isosorbide mononitrate  60 mg Oral BID   levothyroxine  75 mcg Oral Daily   mouth rinse  15 mL Mouth Rinse BID   methylPREDNISolone (SOLU-MEDROL) injection  40 mg Intravenous Q24H   multivitamin with minerals  1 tablet Oral Daily   pantoprazole  40 mg Oral Daily   rosuvastatin  40 mg Oral Daily   senna  1 tablet Oral BID   sodium chloride flush  3 mL Intravenous Q12H   Continuous Infusions:  cefTRIAXone (ROCEPHIN)  IV 1 g (04/14/21 1736)   heparin 1,750 Units/hr (04/15/21 0155)      LOS: 3 days        Andrea Askari Gerome Apley, Andrea [name]

## 2021-04-15 NOTE — Progress Notes (Signed)
PT Cancellation Note  Patient Details Name: Marine Lezotte MRN: [id number] DOB: 1940-11-18   Cancelled Treatment:    Reason Eval/Treat Not Completed: Patient at procedure or test/unavailable. Pt being taken down for lung biopsy. Will continue to follow.   Shary Decamp [facility] 04/15/2021, 12:41 PM [place] Igoe [place] Pager [phone] Office [phone]

## 2021-04-16 DIAGNOSIS — I25708 Atherosclerosis of coronary artery bypass graft(s), unspecified, with other forms of angina pectoris: Secondary | ICD-10-CM | POA: Diagnosis not present

## 2021-04-16 DIAGNOSIS — J9611 Chronic respiratory failure with hypoxia: Secondary | ICD-10-CM | POA: Diagnosis not present

## 2021-04-16 DIAGNOSIS — I2699 Other pulmonary embolism without acute cor pulmonale: Secondary | ICD-10-CM | POA: Diagnosis not present

## 2021-04-16 DIAGNOSIS — I482 Chronic atrial fibrillation, unspecified: Secondary | ICD-10-CM | POA: Diagnosis not present

## 2021-04-16 LAB — BASIC METABOLIC PANEL
Anion gap: 10 (ref 5–15)
BUN: 14 mg/dL (ref 8–23)
CO2: 29 mmol/L (ref 22–32)
Calcium: 8.7 mg/dL — ABNORMAL LOW (ref 8.9–10.3)
Chloride: 97 mmol/L — ABNORMAL LOW (ref 98–111)
Creatinine, Ser: 0.84 mg/dL (ref 0.44–1.00)
GFR, Estimated: >60 mL/min (ref >60)
Glucose, Bld: 153 mg/dL — ABNORMAL HIGH (ref 70–99)
Potassium: 3.8 mmol/L (ref 3.5–5.1)
Sodium: 136 mmol/L (ref 135–145)

## 2021-04-16 LAB — CBC
HCT: 27.6 % — ABNORMAL LOW (ref 36.0–46.0)
Hemoglobin: 8.5 g/dL — ABNORMAL LOW (ref 12.0–15.0)
MCH: 23.4 pg — ABNORMAL LOW (ref 26.0–34.0)
MCHC: 30.8 g/dL (ref 30.0–36.0)
MCV: 75.8 fL — ABNORMAL LOW (ref 80.0–100.0)
Platelets: 264 10*3/uL (ref 150–400)
RBC: 3.64 MIL/uL — ABNORMAL LOW (ref 3.87–5.11)
RDW: 18.5 % — ABNORMAL HIGH (ref 11.5–15.5)
WBC: 12.3 10*3/uL — ABNORMAL HIGH (ref 4.0–10.5)
nRBC: 0 % (ref 0.0–0.2)

## 2021-04-16 MED ORDER — NYSTATIN 100000 UNIT/ML MT SUSP
5.0000 mL | Freq: Four times a day (QID) | OROMUCOSAL | Status: DC
  Administered 2021-04-16 – 2021-04-25 (×33): 500000 [IU] via ORAL
  Filled 2021-04-16 (×32): qty 5

## 2021-04-16 MED ORDER — HEPARIN (PORCINE) 25000 UT/250ML-% IV SOLN
1200.0000 [IU]/h | INTRAVENOUS | Status: DC
  Administered 2021-04-16 – 2021-04-17 (×3): 1750 [IU]/h via INTRAVENOUS
  Administered 2021-04-18: 1650 [IU]/h via INTRAVENOUS
  Administered 2021-04-18: 1750 [IU]/h via INTRAVENOUS
  Administered 2021-04-19 – 2021-04-20 (×2): 1550 [IU]/h via INTRAVENOUS
  Administered 2021-04-21: 13:00:00 1400 [IU]/h via INTRAVENOUS
  Administered 2021-04-22 – 2021-04-23 (×3): 1200 [IU]/h via INTRAVENOUS
  Filled 2021-04-16 (×9): qty 250

## 2021-04-16 MED ORDER — HYDROMORPHONE HCL 1 MG/ML IJ SOLN
1.0000 mg | INTRAMUSCULAR | Status: DC | PRN
  Administered 2021-04-17 – 2021-04-24 (×17): 1 mg via INTRAVENOUS
  Filled 2021-04-16 (×18): qty 1

## 2021-04-16 MED ORDER — POTASSIUM CHLORIDE CRYS ER 20 MEQ PO TBCR
40.0000 meq | EXTENDED_RELEASE_TABLET | Freq: Once | ORAL | Status: AC
  Administered 2021-04-16: 40 meq via ORAL
  Filled 2021-04-16: qty 2

## 2021-04-16 NOTE — Progress Notes (Signed)
OT Cancellation Note  Patient Details Name: Leiliana Foody MRN: [id number] DOB: 12/17/1940   Cancelled Treatment:    Reason Eval/Treat Not Completed: Medical issues which prohibited therapy Patient having two episodes of respiratory difficulty yesterday, RN requesting therapy hold on patient until patient is more medically stable. Therapy will continue to follow.   Corinne Ports [place]. [place], [facility] [place] 04/16/2021, 10:46 AM

## 2021-04-16 NOTE — Progress Notes (Signed)
Pt placed on HHFNC 30L/70%. Pt is tolerating well at this time.

## 2021-04-16 NOTE — Progress Notes (Signed)
Pt transitioned to 15 L salter. Sats are 93%. RN made aware. RN instructed to place pt back on NRB if patient starts to desaturate.

## 2021-04-16 NOTE — Progress Notes (Signed)
PROGRESS NOTE    Andrea [name]  WVP:[id number] DOB: 09/06/1940 DOA: 04/10/2021 PCP: Mosie Lukes, MD    Brief Narrative:  Andrea [name] was admitted to the hospital with the working diagnosis of acute pulmonary embolism/ possible tumor thrombus in the setting of left upper lobe lung mass.   Complicated with acute on chronic hypoxemic respiratory failure, post obstructive pneumonia/ COPD exacerbation   80 yo female with the past medical history of COPD, with chronic hypoxemic respiratory failure, OSA, atrial fibrillation, CAD, chronic diastolic heart failure and hypothyroidism, who presented with right sided chest pain.  Patient reported coughing and chest pain for the last 24 hours. She has been diagnosed with left upper lobe mass and lytic lesion on the posterior right ninth rib, planned biopsy per pulmonary on 04/15/21.  On 11/16 night she acute onset of severe pain on the right posterior rib cage, worse with coughing and severe enough that she came to the hospital for further evaluation.  On her initial physical examination her blood pressure was 115/66, HR 66 and RR24, oxygen saturation 97% on supplemental 0-2 per [place] 2 L/min.  Her lungs had decreased breath sounds bilaterally but no wheezing, heart S1 and S2 present and rhythmic, abdomen was soft and non tender and no lower extremity edema.    NA 132, K 3,8, Cl 97, bicarbonate 29, glucose 113, BUN 10 and cr 0,84 Wbc 12,9, hgb 10,5, hct 35,2 and plt 331. SARS COVID 19 negative   Chest film with large mass on peripheral left upper lobe. Positive interstitial infiltrates left upper lobe CT chest  left upper lobe with bland thrombus versus tumor thrombus. No right heart stranding.  Large area of consolidation in the left upper lobe with lymphangitic spread.  Right posterior ninth rib lesion with pathologic fracture.  1 cm right upper lobe subpleural nodule.    EKG 88 bpm, right ward axis, normal intervals, sinus rhythm with positive  PAC, no significant st segment, negative T wave at the II, III, and Avf.    Patient was placed on IV opioid analgesics for pain control and supplemental 02 per [place] Heparin drip for anticoagulation and IR consulted for lung biopsy (transthoracic).    Old records personally reviewed, patient under work up for left upper lobe mass, plan for transthoracic biopsy as first step. If negative results 2nd step for bronchoscopy.  Consulted IR for inpatient biopsy, pulmonary team Dr Lamonte Sakai has been notified.    Patient had hypotension 11/18, and required bolus 500 cc isotonic saline with improvement on blood pressure.  Her chest pain is more controlled with analgesics but not back to baseline.    Patient with worsening dyspnea, increase congestion and sputum production. Respiratory distress and increased in oxygen requirements.    11/20 worsening opacity of left lung upper and lower lobe. Post obstructive pneumonia and COPD exacerbation. Started on steroids and antibiotic therapy.    11/21 underwent lung biopsy, post procedure she had hemoptysis and oxygen desaturation, required non re-breather mask then able to wean to nasal cannula.   11/22. Patient on heated high flow nasal cannula with oxygen saturation greater than 88%, and improved work of breathing.  She is DNR and DNI, in case of worsening respiratory distress, no aggressive intervention and prioritize comfort.    Assessment & Plan:   Principal Problem:   Pulmonary embolism (HCC) Active Problems:   CAD (coronary artery disease)   Hypothyroidism   Chronic respiratory failure with hypoxia (HCC)   Atrial fibrillation, chronic ([place])  COPD (chronic obstructive pulmonary disease) (HCC)   Chronic diastolic CHF (congestive heart failure) (HCC)   Lung mass   Pathological fracture of one rib   Microcytic anemia   Malnutrition of moderate degree   Acute pulmonary embolism (posible tumor thrombus), in the setting of large left upper lobe mass,  complicated with pathologic 9th rib fracture. Worsening acute on chronic hypoxemic respiratory failure.    Echocardiogram with preserved LV systolic function with EF 50 to 55%, moderate hypokinesis left ventricle based mid inferior wall and inferior septal wall.    Sp lung biopsy on 11/21. Pending pathology report. She is very weak and deconditioned, most of her left lung is affected by mass, lymphangitic spread, and likely post obstructive pneumonia. High risk for worsening respiratory failure.  Plan to continue supportive care and follow with lung biopsy results.    Analgesia with oxycodone, hydromorphone, cyclobenzaprine and acetaminophen.  Limited mobility due to pain and respiratory failure.  Today with no hemoptysis and will resume anticoagulation with IV heparin.    2. COPD with acute exacerbation, post obstructive pneumonia with acute on chronic hypoxemic respiratory failure.  Continue with worsening oxygen requirements, this am she has been placed on heated high flow nasal cannula with good toleration.    Plan to continue with systemic steroids and antibiotic therapy with ceftriaxone. Continue with aggressive bronchodilator therapy with duoneb and inhaled corticosteroids.  On antitussive therapy and airway clearing techniques with Incentive spirometer and flutter valve,  Patient is very weak and deconditioned, very low pulmonary reserve.     3. Paroxysmal atrial fibrillation.  Continue with dofetilide and diltiazem for rate control.  Resume anticoagulation today with IV heparin    4. Chronic diastolic heart failure.  No clinical signs of heart failure exacerbation    5. CAD/ dyslipidemia.  Continue with isosorbide and rosuvastatin.   6. Moderate calorie protein malnutrition/ anemia of chronic disease. Continue with nutritional supplements.  Hgb 8,5 and Hct 27,6  7. Depression. Continue with fluoxetine.   8. Hypothyroid. Continue with levothyroxime.   9. Hypokalemia. K  today 3.8 with preserved renal function with serum cr at 0,84. Added 40 meq Kcl.  Patient continue to be at high risk for worsening respiratory failure   Status is: Inpatient  Remains inpatient appropriate because: IV heparin and high oxygen requirements.,   DVT prophylaxis: IV heparin   Code Status:    DNR and DNI  Family Communication:  I spoke with patient's daughter at the bedside, we talked in detail about patient's condition, plan of care and prognosis and all questions were addressed. We reviewed the pulmonary images, chest film and chest CT     Nutrition Status: Nutrition Problem: Moderate Malnutrition Etiology: chronic illness (COPD, CHF) Signs/Symptoms: moderate fat depletion, severe muscle depletion Interventions: Boost Breeze, MVI    Consultants:  IR   Procedures:  Lung biopsy   Antimicrobials:  Ceftriaxone     Subjective: Patient very weak and deconditioned, dyspnea with minimal efforts, continue to have right sided pain, no nausea or vomiting. Yesterday evening had episode of hemoptysis and respiratory distress, now on heated high flow nasal cannula   Objective: Vitals:   04/16/21 0730 04/16/21 0750 04/16/21 0810 04/16/21 0908  BP:   115/62   Pulse:  69  69  Resp:  (!) 24  (!) 22  Temp:      TempSrc:      SpO2: 99% 90%  93%  Weight:      Height:  Intake/Output Summary (Last 24 hours) at 04/16/2021 1155 Last data filed at 04/16/2021 0444 Gross per 24 hour  Intake --  Output 500 ml  Net -500 ml   Filed Weights   04/14/21 0540 04/15/21 0406 04/16/21 0500  Weight: 57.7 kg 57.6 kg 58 kg    Examination:   General: deconditioned and ill looking appearing  Neurology: Awake and alert, non focal  E ENT: mild pallor, no icterus, oral mucosa moist Cardiovascular: No JVD. S1-S2 present, rhythmic, no gallops, rubs, or murmurs. No lower extremity edema. Pulmonary: decreased breath sounds on the left, no wheezing, scattered rhonchi bilateral and  diffuse left rales. Gastrointestinal. Abdomen soft and non tender Skin. No rashes Musculoskeletal: no joint deformities     Data Reviewed: I have personally reviewed following labs and imaging studies  CBC: Recent Labs  Lab 04/10/21 2122 04/12/21 0418 04/14/21 0150 04/15/21 0039 04/16/21 0147  WBC 12.9* 11.0* 9.8 8.7 12.3*  NEUTROABS 11.0*  --   --  8.4*  --   HGB 10.5* 9.2* 8.6* 9.0* 8.5*  HCT 35.2* 30.5* 28.0* 29.9* 27.6*  MCV 76.4* 75.9* 74.9* 75.3* 75.8*  PLT 331 253 226 231 381   Basic Metabolic Panel: Recent Labs  Lab 04/10/21 2122 04/11/21 0310 04/12/21 0418 04/14/21 0150 04/15/21 0039 04/16/21 0147  NA 135 132* 132*  --  131* 136  K 3.8 3.5 4.5  --  4.0 3.8  CL 97* 96* 96*  --  93* 97*  CO2 29 27 29   --  30 29  GLUCOSE 113* 135* 105*  --  165* 153*  BUN 10 10 10   --  10 14  CREATININE 0.84 0.75 0.90  --  0.74 0.84  CALCIUM 8.5* 8.1* 8.3*  --  8.5* 8.7*  MG  --  1.8  --  2.0  --   --    GFR: Estimated Creatinine Clearance: 40.8 mL/min (by C-G formula based on SCr of 0.84 mg/dL). Liver Function Tests: No results for input(s): AST, ALT, ALKPHOS, BILITOT, PROT, ALBUMIN in the last 168 hours. No results for input(s): LIPASE, AMYLASE in the last 168 hours. No results for input(s): AMMONIA in the last 168 hours. Coagulation Profile: Recent Labs  Lab 04/15/21 0039  INR 1.2   Cardiac Enzymes: No results for input(s): CKTOTAL, CKMB, CKMBINDEX, TROPONINI in the last 168 hours. BNP (last 3 results) No results for input(s): PROBNP in the last 8760 hours. HbA1C: No results for input(s): HGBA1C in the last 72 hours. CBG: No results for input(s): GLUCAP in the last 168 hours. Lipid Profile: No results for input(s): CHOL, HDL, LDLCALC, TRIG, CHOLHDL, LDLDIRECT in the last 72 hours. Thyroid Function Tests: No results for input(s): TSH, T4TOTAL, FREET4, T3FREE, THYROIDAB in the last 72 hours. Anemia Panel: No results for input(s): VITAMINB12, FOLATE,  FERRITIN, TIBC, IRON, RETICCTPCT in the last 72 hours.    Radiology Studies: I have reviewed all of the imaging during this hospital visit personally     Scheduled Meds:  cyclobenzaprine  5 mg Oral TID   diltiazem  240 mg Oral Daily   dofetilide  125 mcg Oral BID   feeding supplement  1 Container Oral TID BM   FLUoxetine  40 mg Oral Daily   ipratropium-albuterol  3 mL Nebulization Q6H   isosorbide mononitrate  60 mg Oral BID   levothyroxine  75 mcg Oral Daily   mouth rinse  15 mL Mouth Rinse BID   methylPREDNISolone (SOLU-MEDROL) injection  40 mg Intravenous Q24H  multivitamin with minerals  1 tablet Oral Daily   pantoprazole  40 mg Oral Daily   potassium chloride  40 mEq Oral Once   rosuvastatin  40 mg Oral Daily   senna  1 tablet Oral BID   sodium chloride flush  3 mL Intravenous Q12H   Continuous Infusions:  cefTRIAXone (ROCEPHIN)  IV 1 g (04/15/21 1628)     LOS: 4 days        Khylah Kendra Gerome Apley, MD

## 2021-04-16 NOTE — Progress Notes (Signed)
Mobility Specialist Progress Note    04/16/21 1535  Mobility  Activity Contraindicated/medical hold   Advised by RN pt not appropriate for mobility today.   [facility] Mobility Specialist  M.S. Primary Phone: [phone] M.S. Secondary Phone: [phone]

## 2021-04-16 NOTE — Progress Notes (Signed)
PT Cancellation Note  Patient Details Name: Andrea [name] MRN: [id number] DOB: [birth year]   Cancelled Treatment:    Reason Eval/Treat Not Completed: (P) Medical issues which prohibited therapy Pt with 2 episodes of respiratory distress yesterday. RN request therapy be deferred until pt more medically stable. PT will follow back tomorrow to assess appropriateness of therapy.   Tyshawn Ciullo B. Migdalia Dk PT, DPT Acute Rehabilitation Services Pager [phone] Office [phone]    [place] 04/16/2021, 1:48 PM

## 2021-04-16 NOTE — Progress Notes (Signed)
[place] for Heparin Indication: pulmonary embolus  Allergies  Allergen Reactions   Erythromycin Other (See Comments)    Made the patient's tongue "burn"   Tape Rash and Other (See Comments)    PLEASE USE PAPER TAPE!!   Meperidine Nausea And Vomiting   Shellfish Allergy Nausea And Vomiting   Ciprofloxacin Rash and Other (See Comments)    Rash from IV   Codeine Nausea And Vomiting   Latex Rash and Other (See Comments)    Made the patient's skin "burn," also   Penicillins Rash and Other (See Comments)    Has patient had a PCN reaction causing immediate rash, facial/tongue/throat swelling, SOB or lightheadedness with hypotension: Unk Has patient had a PCN reaction causing severe rash involving mucus membranes or skin necrosis: No Has patient had a PCN reaction that required hospitalization: No Has patient had a PCN reaction occurring within the last 10 years: No If all of the above answers are "NO", then may proceed with Cephalosporin use.     Patient Measurements: Height: 5\' 2"  (157.5 cm) Weight: 58 kg (127 lb 13.9 oz) IBW/kg (Calculated) : 50.1 Heparin Dosing Weight: 56 kg  Vital Signs: Temp: 98.2 F (36.8 C) (11/22 0440) Temp Source: Axillary (11/22 0440) BP: 115/62 (11/22 0810) Pulse Rate: 69 (11/22 0908)  Labs: Recent Labs    04/13/21 1631 04/13/21 1631 04/14/21 0150 04/14/21 1526 04/15/21 0039 04/16/21 0147  HGB  --    < > 8.6*  --  9.0* 8.5*  HCT  --   --  28.0*  --  29.9* 27.6*  PLT  --   --  226  --  231 264  APTT 63*  --   --   --   --   --   LABPROT  --   --   --   --  15.1  --   INR  --   --   --   --  1.2  --   HEPARINUNFRC 0.23*  --  0.27* 0.24* 0.27*  --   CREATININE  --   --   --   --  0.74 0.84   < > = values in this interval not displayed.     Estimated Creatinine Clearance: 40.8 mL/min (by C-G formula based on SCr of 0.84 mg/dL).   Medical History: Past Medical History:  Diagnosis Date   AAA  (abdominal aortic aneurysm) 01/2009   AAA (2.8 x 3.0)  moderate RAS (left); 2.7 x 2.7 cm (07/11/05)   Acute bronchitis 03/20/2013; 2017   Angina    Anosmia    Asthma    Baker's cyst of knee 01/30/2014   Basal cell carcinoma    "back and left arm"   Bradycardia    Metoprolol stopped 08/2011   CAD (coronary artery disease)    s/CABG (reports IMA and 2 SVGs) back in 1999  Myoview normal 3/10; s/p PCI with DES to PL branch of distal RCA 09/2011; PCI +DES to SVG-RCA, PCI + DES to mid LCx 12/2015   Cerebrovascular disease 01/2009   carotid u/s  R 0-39%   L 60-79%   Chronic thoracic back pain    COPD (chronic obstructive pulmonary disease) (HCC)    Depression    Dizziness    Dysrhythmia    hx of sinus brady   GERD (gastroesophageal reflux disease)    Heart murmur    Herniated lumbar disc without myelopathy    History of blood transfusion  1999   "when I had the bypass; had a PE"   Hyperglycemia 10/23/2015   Hyperlipidemia    Hypertension    Hypothyroidism 09/30/2016   Medicare annual wellness visit, subsequent 07/31/2013   NSTEMI (non-ST elevated myocardial infarction) ([place]) 11/12   Cath showed atretic IMA graft to the LAD, SVG to PD was patent but the continuation of this graft to the PL branch was occluded; there were L to R collaterals; Mid LAD had a 60 to 70% stenosis. She has been treated medically.  Neg Myoview 05/2011   Osteoarthritis of back    Osteoporosis    Pneumonia "several times"   Pulmonary embolism ([place]) 1999   "after my bypass"   PVD (peripheral vascular disease) ([place])    Shortness of breath    Squamous carcinoma    "nose"   Thyroid disease    Hypothyroid   Unstable angina ([place]) 09/25/2015    Medications:  See electronic med rec  Assessment: 80 y.o. F found to have PE on CT scan. Pt on Eliquis PTA for afib - (last dose 11/15 pm). Heparin initiated but switched to enoxaparin 11/17 given PE treatment in active cancer patient. Decision made to switch back to heparin in  preparation for biopsy. Biopsy done 11/21. Patient had hemoptysis yesterday and heparin held.   Orders to resume heparin this morning. Will restart at previous rate and check level tonight.   Goal of Therapy:  Heparin level 0.3-0.7 units/ml Monitor platelets by anticoagulation protocol: Yes   Plan: Restart heparin at 1750 units/hr  8 hour heparin level after restart  Erin Hearing PharmD., BCPS Clinical Pharmacist 04/16/2021 12:20 PM

## 2021-04-16 NOTE — Care Management Important Message (Signed)
Important Message  Patient Details  Name: Janaria Mccammon MRN: [id number] Date of Birth: Aug 03, 1940   Medicare Important Message Given:  Yes     Shelda Altes 04/16/2021, 8:30 AM

## 2021-04-16 NOTE — Progress Notes (Signed)
Nutrition Follow-up  DOCUMENTATION CODES:   Non-severe (moderate) malnutrition in context of chronic illness  INTERVENTION:   D/C Boost Breeze, patient does not like.  Add Vital Cuisine Shake TID with meals, each supplement provides 520 kcal and 22 grams of protein.  Add ice cream TID with meals.  Continue regular diet, encourage good intake.  NUTRITION DIAGNOSIS:   Moderate Malnutrition related to chronic illness (COPD, CHF) as evidenced by moderate fat depletion, severe muscle depletion.  Ongoing   GOAL:   Patient will meet greater than or equal to 90% of their needs  Progressing   MONITOR:   PO intake, Supplement acceptance, Labs, Weight trends  REASON FOR ASSESSMENT:   Consult Assessment of nutrition requirement/status  ASSESSMENT:   80 year old female who presented to the ED on 11/16 with SOB and chest pain. PMH of COPD with chronic hypoxic respiratory failure, OSA, atrial fibrillation, CAD, CHF, hypothyroidism, recently identified L lung mass. Pt admitted with pulmonary embolism, lytic rib lesion with pathologic fracture.  Patient sitting up in bed eating lunch during RD visit. Daughter at bedside reports patient ate well at breakfast today. Patient reports poor intake is related to difficulty breathing. She does not like any of the PO supplements, but agreed to try vital cuisine shakes sent from the kitchen on her meal trays. She requested a peach milkshake from [facility]; daughter to get another family member to bring it in for her.   Labs reviewed.  Medications reviewed and include Solumedrol, MVI with minerals, Protonix, Klor-Con, Senokot, IV antibiotics.  Admission weight 11/16 56.7 kg Current weight 58 kg  Diet Order:   Diet Order             Diet regular Room service appropriate? Yes; Fluid consistency: Thin  Diet effective now                   EDUCATION NEEDS:   Education needs have been addressed  Skin:  Skin Assessment: Reviewed RN  Assessment  Last BM:  04/10/21  Height:   Ht Readings from Last 1 Encounters:  04/10/21 5\' 2"  (1.575 m)    Weight:   Wt Readings from Last 1 Encounters:  04/16/21 58 kg    BMI:  Body mass index is 23.39 kg/m.  Estimated Nutritional Needs:   Kcal:  1600-1800  Protein:  75-90 grams  Fluid:  1.6-1.8 L    Lucas Mallow, RD, LDN, CNSC Please refer to Amion for contact information.

## 2021-04-16 NOTE — Progress Notes (Signed)
Pt's son TImothy updated with her recent condition. Pt currently sleeping comfortably , on NRB with O2 saturation at 97 %. Current BP at 117/105, HR at 67.

## 2021-04-17 ENCOUNTER — Inpatient Hospital Stay (HOSPITAL_COMMUNITY): Payer: Medicare HMO

## 2021-04-17 DIAGNOSIS — R918 Other nonspecific abnormal finding of lung field: Secondary | ICD-10-CM | POA: Diagnosis not present

## 2021-04-17 DIAGNOSIS — Z7189 Other specified counseling: Secondary | ICD-10-CM

## 2021-04-17 DIAGNOSIS — J9601 Acute respiratory failure with hypoxia: Secondary | ICD-10-CM | POA: Diagnosis not present

## 2021-04-17 DIAGNOSIS — I2699 Other pulmonary embolism without acute cor pulmonale: Secondary | ICD-10-CM | POA: Diagnosis not present

## 2021-04-17 DIAGNOSIS — Z66 Do not resuscitate: Secondary | ICD-10-CM

## 2021-04-17 DIAGNOSIS — Z515 Encounter for palliative care: Secondary | ICD-10-CM

## 2021-04-17 LAB — CBC
HCT: 29.6 % — ABNORMAL LOW (ref 36.0–46.0)
Hemoglobin: 8.7 g/dL — ABNORMAL LOW (ref 12.0–15.0)
MCH: 22.6 pg — ABNORMAL LOW (ref 26.0–34.0)
MCHC: 29.4 g/dL — ABNORMAL LOW (ref 30.0–36.0)
MCV: 76.9 fL — ABNORMAL LOW (ref 80.0–100.0)
Platelets: 282 10*3/uL (ref 150–400)
RBC: 3.85 MIL/uL — ABNORMAL LOW (ref 3.87–5.11)
RDW: 18.8 % — ABNORMAL HIGH (ref 11.5–15.5)
WBC: 14.9 10*3/uL — ABNORMAL HIGH (ref 4.0–10.5)
nRBC: 0 % (ref 0.0–0.2)

## 2021-04-17 LAB — BASIC METABOLIC PANEL
Anion gap: 9 (ref 5–15)
BUN: 15 mg/dL (ref 8–23)
CO2: 27 mmol/L (ref 22–32)
Calcium: 8.6 mg/dL — ABNORMAL LOW (ref 8.9–10.3)
Chloride: 98 mmol/L (ref 98–111)
Creatinine, Ser: 0.84 mg/dL (ref 0.44–1.00)
GFR, Estimated: >60 mL/min (ref >60)
Glucose, Bld: 117 mg/dL — ABNORMAL HIGH (ref 70–99)
Potassium: 4.2 mmol/L (ref 3.5–5.1)
Sodium: 134 mmol/L — ABNORMAL LOW (ref 135–145)

## 2021-04-17 LAB — HEPARIN LEVEL (UNFRACTIONATED): Heparin Unfractionated: 0.42 IU/mL (ref 0.30–0.70)

## 2021-04-17 LAB — SURGICAL PATHOLOGY

## 2021-04-17 NOTE — Progress Notes (Signed)
Mobility Specialist Progress Note    04/17/21 1637  Mobility  Activity Contraindicated/medical hold   Pt stated she was mentally and physically exhausted. Will f/u as schedule permits.   [facility] Mobility Specialist  M.S. Primary Phone: [phone] M.S. Secondary Phone: [phone]

## 2021-04-17 NOTE — Progress Notes (Signed)
[place] for Heparin Indication: pulmonary embolus  Allergies  Allergen Reactions   Erythromycin Other (See Comments)    Made the patient's tongue "burn"   Tape Rash and Other (See Comments)    PLEASE USE PAPER TAPE!!   Meperidine Nausea And Vomiting   Shellfish Allergy Nausea And Vomiting   Ciprofloxacin Rash and Other (See Comments)    Rash from IV   Codeine Nausea And Vomiting   Latex Rash and Other (See Comments)    Made the patient's skin "burn," also   Penicillins Rash and Other (See Comments)    Has patient had a PCN reaction causing immediate rash, facial/tongue/throat swelling, SOB or lightheadedness with hypotension: Unk Has patient had a PCN reaction causing severe rash involving mucus membranes or skin necrosis: No Has patient had a PCN reaction that required hospitalization: No Has patient had a PCN reaction occurring within the last 10 years: No If all of the above answers are "NO", then may proceed with Cephalosporin use.     Patient Measurements: Height: 5\' 2"  (157.5 cm) Weight: 57.5 kg (126 lb 12.2 oz) IBW/kg (Calculated) : 50.1 Heparin Dosing Weight: 56 kg  Vital Signs: Temp: 97.9 F (36.6 C) (11/23 0413) Temp Source: Oral (11/23 0413) BP: 101/53 (11/23 0413) Pulse Rate: 71 (11/23 0818)  Labs: Recent Labs    04/14/21 1526 04/15/21 0039 04/15/21 0039 04/16/21 0147 04/17/21 0053  HGB  --  9.0*   < > 8.5* 8.7*  HCT  --  29.9*  --  27.6* 29.6*  PLT  --  231  --  264 282  LABPROT  --  15.1  --   --   --   INR  --  1.2  --   --   --   HEPARINUNFRC 0.24* 0.27*  --   --  0.42  CREATININE  --  0.74  --  0.84 0.84   < > = values in this interval not displayed.     Estimated Creatinine Clearance: 40.8 mL/min (by C-G formula based on SCr of 0.84 mg/dL).   Medical History: Past Medical History:  Diagnosis Date   AAA (abdominal aortic aneurysm) 01/2009   AAA (2.8 x 3.0)  moderate RAS (left); 2.7 x 2.7 cm  (07/11/05)   Acute bronchitis 03/20/2013; 2017   Angina    Anosmia    Asthma    Baker's cyst of knee 01/30/2014   Basal cell carcinoma    "back and left arm"   Bradycardia    Metoprolol stopped 08/2011   CAD (coronary artery disease)    s/CABG (reports IMA and 2 SVGs) back in 1999  Myoview normal 3/10; s/p PCI with DES to PL branch of distal RCA 09/2011; PCI +DES to SVG-RCA, PCI + DES to mid LCx 12/2015   Cerebrovascular disease 01/2009   carotid u/s  R 0-39%   L 60-79%   Chronic thoracic back pain    COPD (chronic obstructive pulmonary disease) (HCC)    Depression    Dizziness    Dysrhythmia    hx of sinus brady   GERD (gastroesophageal reflux disease)    Heart murmur    Herniated lumbar disc without myelopathy    History of blood transfusion 1999   "when I had the bypass; had a PE"   Hyperglycemia 10/23/2015   Hyperlipidemia    Hypertension    Hypothyroidism 09/30/2016   Medicare annual wellness visit, subsequent 07/31/2013   NSTEMI (non-ST elevated myocardial  infarction) ([place]) 11/12   Cath showed atretic IMA graft to the LAD, SVG to PD was patent but the continuation of this graft to the PL branch was occluded; there were L to R collaterals; Mid LAD had a 60 to 70% stenosis. She has been treated medically.  Neg Myoview 05/2011   Osteoarthritis of back    Osteoporosis    Pneumonia "several times"   Pulmonary embolism ([place]) 1999   "after my bypass"   PVD (peripheral vascular disease) ([place])    Shortness of breath    Squamous carcinoma    "nose"   Thyroid disease    Hypothyroid   Unstable angina ([place]) 09/25/2015    Medications:  See electronic med rec  Assessment: 80 y.o. F found to have PE on CT scan. Pt on Eliquis PTA for afib - (last dose 11/15 pm). Heparin initiated but switched to enoxaparin 11/17 given PE treatment in active cancer patient. Decision made to switch back to heparin in preparation for biopsy. Biopsy done 11/21. Patient had hemoptysis yesterday and heparin held.    Heparin resumed 11/22. Level at goal this morning at 0.42 on previous rate of 1750 unit/hr. Hgb stable 8.7, plt wnl. No bleeding issues noted.   Goal of Therapy:  Heparin level 0.3-0.7 units/ml Monitor platelets by anticoagulation protocol: Yes   Plan: Continue heparin at 1750 units/hr  Daily heparin level and cbc  Erin Hearing PharmD., BCPS Clinical Pharmacist 04/17/2021 8:36 AM

## 2021-04-17 NOTE — Progress Notes (Signed)
OT Cancellation Note  Patient Details Name: Andrea [name] MRN: [id number] DOB: [birth year]   Cancelled Treatment:    Reason Eval/Treat Not Completed: Patient declined, not able to try today.  OT to continue efforts as appropriate.    Azie Mcconahy D Kieon Lawhorn 04/17/2021, 5:06 PM

## 2021-04-17 NOTE — Consult Note (Signed)
Consultation Note Date: 04/17/2021   Patient Name: Andrea [name]  DOB: 06-06-1940  MRN: [id number]  Age / Sex: 80 y.o., female  PCP: Mosie Lukes, MD Referring Physician: Modena Jansky, MD  Reason for Consultation: Establishing goals of care  HPI/Patient Profile: 80 y.o. female  with past medical history of  COPD, OSA on CPAP, CAD s/p CABG, hypertension, PAD, former smoker, history of PE in the past after her CABG, A. fib, chronic diastolic CHF, and hypothyroidism admitted on 04/10/2021 with acute onset of severe right-sided rib cage pain. Patient had recently seen pulmonlolgy on 11/8 for LUL mass and lytic lesion on R 9th rib. CT chest showed left upper lobe with bland thrombus versus tumor thrombus, large area of consolidation in the left upper lobe with lymphangitic spread, right posterior ninth rib lesion with pathological fracture.  Admitted for large left upper lobe mass, suspecting primary lung cancer with acute pulmonary embolism/possible tumor thrombus, pathological ninth rib fracture, postobstructive pneumonia, possible COPD exacerbation and acute on chronic hypoxic respiratory failure. Hospitalization has been complicated by worsening respiratory failure requiring HFNC. Patient underwent CT-guided needle biopsy by IR on 04/15/2021 and pathology is pending. PMT consulted to discuss [place].   Clinical Assessment and Goals of Care: I have reviewed medical records including EPIC notes, labs and imaging, received report from RN, assessed the patient and then met with patient and her son at bedside to discuss diagnosis prognosis, GOC, EOL wishes, disposition and options.  I introduced Palliative Medicine as specialized medical care for people living with serious illness. It focuses on providing relief from the symptoms and stress of a serious illness. The goal is to improve quality of life for both the patient and the family.  We discussed a  brief life review of the patient. Patient tells me about her career. She has 3 children.   As far as functional and nutritional status, she tells me she was doing well at home - was having some fatigue but was able to live independently and care for herself. She also tells me decreased appetite.     We discussed patient's current illness and what it means in the larger context of patient's on-going co-morbidities.  Natural disease trajectory and expectations at EOL were discussed. Discussed concern about lung cancer. Discussed her current need for HFNC - reviewed current interventions to attempt to wean oxygen down. Discussed that this is first priority - weaning down oxygen.   I attempted to elicit values and goals of care important to the patient. Patient is clear that she is not interested in resuscitation efforts or trach. She does share that she would be open to discussing potential cancer treatment options - we discussed uncertainty of this d/t current severe illness.   The difference between aggressive medical intervention and comfort care was considered in light of the patient's goals of care.   Discussed with patient/family the importance of continued conversation with family and the medical providers regarding overall plan of care and treatment options, ensuring decisions are within the context of the patient's values and GOCs.    At this time, patient is interested in continuing current plan of care and awaiting biopsy results. We discussed pulmonary hygiene for her as team continues to work towards weaning down oxygen. We discussed ongoing palliative follow up and she is agreeable.   Questions and concerns were addressed. The family was encouraged to call with questions or concerns.   Primary Decision Maker PATIENT    SUMMARY OF RECOMMENDATIONS   -  DNR/DNI - continue current treatment plan/await biopsy - patient would be interested in exploring treatment options but understands she  is currently very ill and we first need to see if we can wean down oxygen - interested in ongoing follow up - will see 11/24  Code Status/Advance Care Planning: DNR     Primary Diagnoses: Present on Admission:  Pulmonary embolism ([place])  Atrial fibrillation, chronic (HCC)  CAD (coronary artery disease)  Chronic respiratory failure with hypoxia ([place])  COPD (chronic obstructive pulmonary disease) ([place])  Lung mass  Hypothyroidism  Pathological fracture of one rib  Microcytic anemia  Chronic diastolic CHF (congestive heart failure) ([place])   I have reviewed the medical record, interviewed the patient and family, and examined the patient. The following aspects are pertinent.  Past Medical History:  Diagnosis Date   AAA (abdominal aortic aneurysm) 01/2009   AAA (2.8 x 3.0)  moderate RAS (left); 2.7 x 2.7 cm (07/11/05)   Acute bronchitis 03/20/2013; 2017   Angina    Anosmia    Asthma    Baker's cyst of knee 01/30/2014   Basal cell carcinoma    "back and left arm"   Bradycardia    Metoprolol stopped 08/2011   CAD (coronary artery disease)    s/CABG (reports IMA and 2 SVGs) back in 1999  Myoview normal 3/10; s/p PCI with DES to PL branch of distal RCA 09/2011; PCI +DES to SVG-RCA, PCI + DES to mid LCx 12/2015   Cerebrovascular disease 01/2009   carotid u/s  R 0-39%   L 60-79%   Chronic thoracic back pain    COPD (chronic obstructive pulmonary disease) (HCC)    Depression    Dizziness    Dysrhythmia    hx of sinus brady   GERD (gastroesophageal reflux disease)    Heart murmur    Herniated lumbar disc without myelopathy    History of blood transfusion 1999   "when I had the bypass; had a PE"   Hyperglycemia 10/23/2015   Hyperlipidemia    Hypertension    Hypothyroidism 09/30/2016   Medicare annual wellness visit, subsequent 07/31/2013   NSTEMI (non-ST elevated myocardial infarction) ([place]) 11/12   Cath showed atretic IMA graft to the LAD, SVG to PD was patent but the continuation of this  graft to the PL branch was occluded; there were L to R collaterals; Mid LAD had a 60 to 70% stenosis. She has been treated medically.  Neg Myoview 05/2011   Osteoarthritis of back    Osteoporosis    Pneumonia "several times"   Pulmonary embolism ([place]) 1999   "after my bypass"   PVD (peripheral vascular disease) ([place])    Shortness of breath    Squamous carcinoma    "nose"   Thyroid disease    Hypothyroid   Unstable angina ([place]) 09/25/2015   Social History   Socioeconomic History   Marital status: Married    Spouse name: Not on file   Number of children: 5   Years of education: Not on file   Highest education level: Not on file  Occupational History   Occupation: Retired    Fish farm manager: RETIRED    Comment: former  Marine scientist  Tobacco Use   Smoking status: Former    Packs/day: 0.50    Years: 40.00    Pack years: 20.00    Types: Cigarettes    Start date: 1958    Quit date: 01/19/1993    Years since quitting: 28.2   Smokeless tobacco: Never  Vaping Use   Vaping Use: Never used  Substance and Sexual Activity   Alcohol use: Yes    Comment: 01/02/2016 "might drink a glass of wine socially q 3-4 months"   Drug use: No   Sexual activity: Not Currently    Birth control/protection: Post-menopausal  Other Topics Concern   Not on file  Social History Narrative   Married with 5 children   Social Determinants of Health   Financial Resource Strain: Not on file  Food Insecurity: Not on file  Transportation Needs: Not on file  Physical Activity: Not on file  Stress: Not on file  Social Connections: Not on file   Family History  Problem Relation Age of Onset   Heart attack Mother 103   Diabetes Mother    Colon cancer Father 53   Lung cancer Father        smoked   Heart disease Father    Hypertension Father    Angina Father    Lung cancer Sister        smoked   Hypothyroidism Sister    Hypertension Brother    Heart attack Brother    Heart disease Brother        PTCA with Stent    Heart attack Brother        CABG   Heart disease Brother        CABG with 1 bypass   Aneurysm Brother        brain   Alcohol abuse Brother    Hyperlipidemia Daughter    Diabetes Maternal Grandmother    Stroke Maternal Grandmother    Cirrhosis Maternal Grandfather    Stroke Paternal Grandmother    Obesity Daughter    Obesity Son    Scheduled Meds:  cyclobenzaprine  5 mg Oral TID   diltiazem  240 mg Oral Daily   dofetilide  125 mcg Oral BID   feeding supplement  1 Container Oral TID BM   FLUoxetine  40 mg Oral Daily   ipratropium-albuterol  3 mL Nebulization Q6H   levothyroxine  75 mcg Oral Daily   mouth rinse  15 mL Mouth Rinse BID   methylPREDNISolone (SOLU-MEDROL) injection  40 mg Intravenous Q24H   multivitamin with minerals  1 tablet Oral Daily   nystatin  5 mL Oral QID   pantoprazole  40 mg Oral Daily   rosuvastatin  40 mg Oral Daily   senna  1 tablet Oral BID   sodium chloride flush  3 mL Intravenous Q12H   Continuous Infusions:  cefTRIAXone (ROCEPHIN)  IV 1 g (04/17/21 1344)   heparin 1,750 Units/hr (04/17/21 1339)   PRN Meds:.acetaminophen **OR** acetaminophen, HYDROmorphone (DILAUDID) injection, ipratropium-albuterol, ondansetron **OR** ondansetron (ZOFRAN) IV, oxyCODONE, polyethylene glycol Allergies  Allergen Reactions   Erythromycin Other (See Comments)    Made the patient's tongue "burn"   Tape Rash and Other (See Comments)    PLEASE USE PAPER TAPE!!   Meperidine Nausea And Vomiting   Shellfish Allergy Nausea And Vomiting   Ciprofloxacin Rash and Other (See Comments)    Rash from IV   Codeine Nausea And Vomiting   Latex Rash and Other (See Comments)    Made the patient's skin "burn," also   Penicillins Rash and Other (See Comments)    Has patient had a PCN reaction causing immediate rash, facial/tongue/throat swelling, SOB or lightheadedness with hypotension: Unk Has patient had a PCN reaction causing severe rash involving mucus membranes or skin  necrosis: No Has patient  had a PCN reaction that required hospitalization: No Has patient had a PCN reaction occurring within the last 10 years: No If all of the above answers are "NO", then may proceed with Cephalosporin use.    Review of Systems  Constitutional:  Positive for activity change, appetite change and fatigue.   Physical Exam Constitutional:      General: She is not in acute distress.    Appearance: She is ill-appearing.  Pulmonary:     Effort: Pulmonary effort is normal.  Skin:    General: Skin is warm and dry.  Neurological:     Mental Status: She is alert and oriented to person, place, and time.    Vital Signs: BP (!) 89/60 (BP Location: Left Arm)   Pulse 100   Temp 98.1 F (36.7 C) (Oral)   Resp 20   Ht 5' 2"  (1.575 m)   Wt 57.5 kg   SpO2 92%   BMI 23.19 kg/m  Pain Scale: 0-10   Pain Score: 5    SpO2: SpO2: 92 % O2 Device:SpO2: 92 % O2 Flow Rate: .O2 Flow Rate (L/min): 25 L/min  IO: Intake/output summary:  Intake/Output Summary (Last 24 hours) at 04/17/2021 1718 Last data filed at 04/17/2021 1455 Gross per 24 hour  Intake 1136.81 ml  Output --  Net 1136.81 ml    LBM: Last BM Date: 04/10/21 Baseline Weight: Weight: 56.7 kg Most recent weight: Weight: 57.5 kg     Palliative Assessment/Data: PPS 50%    Time Total: 70 minutes Greater than 50%  of this time was spent counseling and coordinating care related to the above assessment and plan.  Juel Burrow, DNP, AGNP-C Palliative Medicine Team [phone] Pager: [phone]

## 2021-04-17 NOTE — Progress Notes (Addendum)
PROGRESS NOTE   Andrea [name]  YFV:[id number]    DOB: Oct 12, 1940    DOA: 04/10/2021  PCP: Mosie Lukes, MD   I have briefly reviewed patients previous medical records in [facility].  Chief complaint: Dyspnea   Brief Narrative:  80 year old female, medical history significant for COPD, OSA on CPAP, CAD s/p CABG, hypertension, PAD, former smoker, history of PE in the past after her CABG, A. fib, chronic diastolic CHF, hypothyroid, recently saw pulmonology/Dr. Lamonte Sakai on 04/02/2021 for a left upper lobe mass and lytic lesion on the posterior right ninth rib identified on CT chest 03/07/2021, significantly hypermetabolic on PET 16/38/4665, planned outpatient biopsy on 04/15/2021, now admitted 04/10/2021 with complaints of acute onset of severe right-sided rib cage pain, worse with coughing and dyspnea.  CT chest showed left upper lobe with bland thrombus versus tumor thrombus, large area of consolidation in the left upper lobe with lymphangitic spread, right posterior ninth rib lesion with pathological fracture.  Admitted for large left upper lobe mass, suspecting primary lung cancer with acute pulmonary embolism/possible tumor thrombus, pathological ninth rib fracture, postobstructive pneumonia, possible COPD exacerbation and acute on chronic hypoxic respiratory failure.  S/p lung mass biopsy by IR on 11/21.  Hospital course complicated by progressive acute respiratory failure with hypoxia requiring HFNC and postprocedure mild hemoptysis.  PCCM was consulted on 11/23.   Assessment & Plan:  Principal Problem:   Pulmonary embolism (HCC) Active Problems:   CAD (coronary artery disease)   Hypothyroidism   Chronic respiratory failure with hypoxia (HCC)   Atrial fibrillation, chronic (HCC)   COPD (chronic obstructive pulmonary disease) (HCC)   Chronic diastolic CHF (congestive heart failure) (HCC)   Lung mass   Pathological fracture of one rib   Microcytic anemia   Malnutrition of  moderate degree   Large left upper lobe mass, presumed primary lung cancer, complicated by right posterior ninth rib fracture, lymphangitic spread to LUL and pleuritic chest pain, hemoptysis: - Was supposed to have outpatient evaluation including biopsy but had to be hospitalized - CT-guided needle biopsy by IR on 04/15/2021.  Pathology is still pending. - PCCM consultation 11/23 appreciated.  Discussed with Dr. Lamonte Sakai.  Concerned that her overall prognosis, appropriateness to tolerate treatment is poor and may be heading in the direction of palliative care. - Supportive treatment of dyspnea/hypoxia with oxygen supplements, pulmonary toilet and pain with multimodality pain control.  If hemoptysis were to worsen then may have to stop anticoagulation. - We will get palliative care team involved to address goals of care.  Left upper lobe PE (bland thrombus versus tumor thrombus): - Continue IV heparin anticoagulation for now. - 2D echo showed LVEF of 50-55%, moderate hypokinesis of left ventricle based mid inferior wall and inferior septal wall.  Suspected left upper lobe postobstructive pneumonia: - Continue empiric IV antibiotics.  1 cm right upper lobe subpleural nodule: - Outpatient follow-up.  May be part of the above malignant process.  COPD exacerbation: - Emphysema noted on CTA chest - Continue IV Solu-Medrol, IV antibiotics, inhaled steroids.  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 - Currently on HFNC 25 L/min at 80% FiO2. - We will check chest x-ray.  Microcytic anemia - May be related to anemia of chronic disease or iron deficiency anemia - Stable.  Paroxysmal A. fib: - Continue dofetilide and diltiazem for rate control.  Issues with hypotension earlier  today and may have to hold off on diltiazem. - Continue IV  heparin for now.  Hypotension - Discontinue Imdur.  We will need to reassess before tomorrow morning dose regarding cutting back or stopping Cardizem.  Chronic diastolic CHF - Clinically euvolemic.  CAD - May have to hold nitrates due to hypotension  Hyperlipidemia - Continue statins.  Oral thrush - Continue nystatin swish and swallow  Depression - Continue fluoxetine  Hypothyroid - Continue levothyroxine  Body mass index is 23.19 kg/m.  Nutritional Status Nutrition Problem: Moderate Malnutrition Etiology: chronic illness (COPD, CHF) Signs/Symptoms: moderate fat depletion, severe muscle depletion Interventions: Boost Breeze, MVI  Pressure Ulcer:     DVT prophylaxis:   Currently on IV heparin drip   Code Status: DNR Family Communication: I discussed in detail with patient's son on 11/23, updated care and answered all questions. Disposition:  Status is: Inpatient  Remains inpatient appropriate because: Acute respiratory failure with hypoxia on HFNC 25 L/min, FiO2 80%, on IV antibiotics for pneumonia, IV heparin for PE and critically ill.        Consultants:   Interventional radiology PCCM  Procedures:   CT-guided needle biopsy of lung mass by interventional radiology on 11/21  Antimicrobials:   IV ceftriaxone   Subjective:  Seen this morning.  Cough with mild blood-tinged.  Bilateral posterior rib cage area pain, worse with coughing or movement.  Pain controlled with meds.  Objective:   Vitals:   04/17/21 0957 04/17/21 1321 04/17/21 1347 04/17/21 1403  BP: (!) 86/74  (!) 87/59 (!) 89/60  Pulse: 95 (!) 116 (!) 102 100  Resp: 20 (!) 22 20 20   Temp: 98.2 F (36.8 C)  98.2 F (36.8 C) 98.1 F (36.7 C)  TempSrc: Oral  Oral Oral  SpO2: (!) 86% 91% (!) 89% 92%  Weight:      Height:        General exam: Elderly female, moderately built, frail, chronically ill looking and cachectic, lying propped up in bed with some tachypnea, especially with  minimal effort/speaking.  Bitemporal wasting.  Oral thrush present. Respiratory system: Decreased breath sounds left lung fields, right lung fields harsh.  No wheezing, rhonchi or obvious crackles heard.  On HFNC as noted above. Cardiovascular system: S1 & S2 heard, RRR. No JVD, murmurs, rubs, gallops or clicks. No pedal edema.  Telemetry personally reviewed: Sinus rhythm with PACs. Gastrointestinal system: Abdomen is nondistended, soft and nontender. No organomegaly or masses felt. Normal bowel sounds heard. Central nervous system: Alert and oriented. No focal neurological deficits. Extremities: Symmetric 5 x 5 power. Skin: No rashes, lesions or ulcers Psychiatry: Judgement and insight appear normal. Mood & affect appropriate.     Data Reviewed:   I have personally reviewed following labs and imaging studies   CBC: Recent Labs  Lab 04/10/21 2122 04/12/21 0418 04/15/21 0039 04/16/21 0147 04/17/21 0053  WBC 12.9*   < > 8.7 12.3* 14.9*  NEUTROABS 11.0*  --  8.4*  --   --   HGB 10.5*   < > 9.0* 8.5* 8.7*  HCT 35.2*   < > 29.9* 27.6* 29.6*  MCV 76.4*   < > 75.3* 75.8* 76.9*  PLT 331   < > 231 264 282   < > = values in this interval not displayed.    Basic Metabolic Panel: Recent Labs  Lab 04/11/21 0310 04/12/21 0418 04/14/21 0150 04/15/21 0039 04/16/21 0147 04/17/21 0053  NA 132* 132*  --  131* 136 134*  K  3.5 4.5  --  4.0 3.8 4.2  CL 96* 96*  --  93* 97* 98  CO2 27 29  --  30 29 27   GLUCOSE 135* 105*  --  165* 153* 117*  BUN 10 10  --  10 14 15   CREATININE 0.75 0.90  --  0.74 0.84 0.84  CALCIUM 8.1* 8.3*  --  8.5* 8.7* 8.6*  MG 1.8  --  2.0  --   --   --     Liver Function Tests: No results for input(s): AST, ALT, ALKPHOS, BILITOT, PROT, ALBUMIN in the last 168 hours.  CBG: No results for input(s): GLUCAP in the last 168 hours.  Microbiology Studies:   Recent Results (from the past 240 hour(s))  Resp Panel by RT-PCR (Flu A&B, Covid) Nasopharyngeal Swab      Status: None   Collection Time: 04/11/21  3:04 AM   Specimen: Nasopharyngeal Swab; Nasopharyngeal(NP) swabs in vial transport medium  Result Value Ref Range Status   SARS Coronavirus 2 by RT PCR NEGATIVE NEGATIVE Final    Comment: (NOTE) SARS-CoV-2 target nucleic acids are NOT DETECTED.  The SARS-CoV-2 RNA is generally detectable in upper respiratory specimens during the acute phase of infection. The lowest concentration of SARS-CoV-2 viral copies this assay can detect is 138 copies/mL. A negative result does not preclude SARS-Cov-2 infection and should not be used as the sole basis for treatment or other patient management decisions. A negative result may occur with  improper specimen collection/handling, submission of specimen other than nasopharyngeal swab, presence of viral mutation(s) within the areas targeted by this assay, and inadequate number of viral copies(<138 copies/mL). A negative result must be combined with clinical observations, patient history, and epidemiological information. The expected result is Negative.  Fact Sheet for Patients:  EntrepreneurPulse.com.au  Fact Sheet for Healthcare Providers:  IncredibleEmployment.be  This test is no t yet approved or cleared by the [place] FDA and  has been authorized for detection and/or diagnosis of SARS-CoV-2 by FDA under an Emergency Use Authorization (EUA). This EUA will remain  in effect (meaning this test can be used) for the duration of the COVID-19 declaration under Section 564(b)(1) of the Act, 21 U.S.C.section 360bbb-3(b)(1), unless the authorization is terminated  or revoked sooner.       Influenza A by PCR NEGATIVE NEGATIVE Final   Influenza B by PCR NEGATIVE NEGATIVE Final    Comment: (NOTE) The Xpert Xpress SARS-CoV-2/FLU/RSV plus assay is intended as an aid in the diagnosis of influenza from Nasopharyngeal swab specimens and should not be used as a sole basis  for treatment. Nasal washings and aspirates are unacceptable for Xpert Xpress SARS-CoV-2/FLU/RSV testing.  Fact Sheet for Patients: EntrepreneurPulse.com.au  Fact Sheet for Healthcare Providers: IncredibleEmployment.be  This test is not yet approved or cleared by the [place] FDA and has been authorized for detection and/or diagnosis of SARS-CoV-2 by FDA under an Emergency Use Authorization (EUA). This EUA will remain in effect (meaning this test can be used) for the duration of the COVID-19 declaration under Section 564(b)(1) of the Act, 21 U.S.C. section 360bbb-3(b)(1), unless the authorization is terminated or revoked.  Performed at [facility], [place] [address] [place], [place] [address]     Radiology Studies:  DG Chest 1 View  Result Date: 04/15/2021 CLINICAL DATA:  Dyspnea EXAM: CHEST  1 VIEW COMPARISON:  04/15/2021 FINDINGS: Single frontal view of the chest demonstrates persistent left upper lobe mass with suspected lymphangitic spread of  disease throughout the left upper lobe. Emphysematous changes are seen throughout the right lung. No effusion or pneumothorax. Erosive change left posterolateral fifth rib consistent with direct invasion by the adjacent left upper lobe mass as per recent CT. IMPRESSION: 1. Stable left upper lobe mass and lymphangitic spread. 2. Stable left fifth rib erosive change consistent with direct invasion of tumor. The right posterior ninth rib lytic lesion seen on CT is not readily apparent by x-ray. Electronically Signed   By: Randa Ngo M.D.   On: 04/15/2021 19:48   DG Chest Port 1 View  Result Date: 04/15/2021 CLINICAL DATA:  Status post biopsy EXAM: PORTABLE CHEST 1 VIEW COMPARISON:  Radiograph 04/14/2021. FINDINGS: Progressive volume loss in the left hemithorax with subtotal opacification. Decreased aeration of the left perihilar and lower lung zone. No visualized pneumothorax. Skin fold projects  over the right hemithorax. Patient is post median sternotomy. Heart size and mediastinal contours are obscured. Emphysematous changes are again seen. IMPRESSION: 1. No visualized pneumothorax. 2. Progressive volume loss in the left hemithorax with subtotal opacification. This may be due to combination of progressive atelectasis/collapse and or airspace disease/hemorrhage. 3. Skin fold projects over the right chest. Electronically Signed   By: Keith Rake M.D.   On: 04/15/2021 18:01   CT LUNG MASS BIOPSY  Result Date: 04/15/2021 INDICATION: 80 year old woman with left upper lobe lung mass presents to IR for CT-guided biopsy EXAM: CT-guided biopsy of left lung mass MEDICATIONS: None. ANESTHESIA/SEDATION: Moderate (conscious) sedation was employed during this procedure. A total of Versed 1 mg and Fentanyl 25 mcg was administered intravenously. Moderate Sedation Time: 10 minutes. The patient's level of consciousness and vital signs were monitored continuously by radiology nursing throughout the procedure under my direct supervision. COMPLICATIONS: SIR Level A - No therapy, no consequence. Moderate hemoptysis PROCEDURE: Informed written consent was obtained from the patient after a thorough discussion of the procedural risks, benefits and alternatives. All questions were addressed. Maximal Sterile Barrier Technique was utilized including caps, mask, sterile gowns, sterile gloves, sterile drape, hand hygiene and skin antiseptic. A timeout was performed prior to the initiation of the procedure. Patient positioned supine on the CT table. The left anterior chest wall skin prepped and draped in usual fashion. Utilizing CT guidance, 17 gauge introducer needle was advanced into the left upper lobe lung mass and 3-18 gauge cores were obtained. All samples were sent to pathology in formalin. The patient developed moderate hemoptysis after the biopsy requiring supplemental oxygen. Patient's hemoptysis stopped after she  was positioned left lateral decubitus. IMPRESSION: CT-guided biopsy of left upper lobe lung mass. Electronically Signed   By: Miachel Roux M.D.   On: 04/15/2021 15:48    Scheduled Meds:    cyclobenzaprine  5 mg Oral TID   diltiazem  240 mg Oral Daily   dofetilide  125 mcg Oral BID   feeding supplement  1 Container Oral TID BM   FLUoxetine  40 mg Oral Daily   ipratropium-albuterol  3 mL Nebulization Q6H   isosorbide mononitrate  60 mg Oral BID   levothyroxine  75 mcg Oral Daily   mouth rinse  15 mL Mouth Rinse BID   methylPREDNISolone (SOLU-MEDROL) injection  40 mg Intravenous Q24H   multivitamin with minerals  1 tablet Oral Daily   nystatin  5 mL Oral QID   pantoprazole  40 mg Oral Daily   rosuvastatin  40 mg Oral Daily   senna  1 tablet Oral BID   sodium chloride flush  3 mL Intravenous Q12H    Continuous Infusions:    cefTRIAXone (ROCEPHIN)  IV 1 g (04/17/21 1344)   heparin 1,750 Units/hr (04/17/21 1339)     LOS: 5 days     Vernell Leep, MD,  FACP, [facility], [facility], [facility] (Care Management Physician Certified) [place]  To contact the attending provider between 7A-7P or the covering provider during after hours 7P-7A, please log into the web site [email or web address] and access using [place] password for that web site. If you do not have the password, please call the hospital operator.  04/17/2021, 2:45 PM

## 2021-04-17 NOTE — Progress Notes (Signed)
PT Cancellation Note  Patient Details Name: Andrea [name] MRN: [id number] DOB: [birth year]   Cancelled Treatment:    Reason Eval/Treat Not Completed: Pain limiting ability to participate;Fatigue/lethargy limiting ability to participate - pt states she is too painful and fatigued to participate in PT this am, requesting PT leave her be. PT to check back if able.   Stacie Glaze, PT DPT Acute Rehabilitation Services Pager [phone]  Office [phone]    Louis Matte 04/17/2021, 9:46 AM

## 2021-04-17 NOTE — Consult Note (Addendum)
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 Lamonte Sakai who was scheduled for a CT-guided biopsy of left lung mass on 04/15/2021 but on 04/12/2021 during a coughing paroxysm and she fractured rib.  She presented to the emergency department time for evaluation and treatment she reported she been having hemoptysis off and on for 1 month..  Although there is conflicting information concerning her hemoptysis.  She was treated with analgesics along with supplemental oxygen but continued to decline and required increasing levels of oxygen until 04/17/2021 she is on 40 L with 80% FiO2 with low saturations and 90s.  She is positive for thrombus on CT scan 04/11/2021 left upper lobe versus tumor thrombus.  No findings of right heart strain.  A large area of consolidation left upper lobe in keeping with well-known malignancy associated with related with lymphogenic spread of left upper lobe. the results of her CT-guided biopsy performed on 04/15/2021 are not back yet.  Her PET scan on 10/27 was suggestive of bronchogenic carcinoma.  With mets to the ribs.  Pulmonary critical care called to the bedside 04/17/2021 due to increasing FiO2 needs.  This may be multifactorial secondary to shallow respirations from chest pain from fractured rib along with possible lymphangitic spread of carcinoma.   PAST MEDICAL HISTORY :   has a past medical history of AAA (abdominal aortic aneurysm) (01/2009), Acute bronchitis (03/20/2013; 2017), Angina, Anosmia, Asthma, Baker's cyst of knee (01/30/2014), Basal cell carcinoma,  Bradycardia, CAD (coronary artery disease), Cerebrovascular disease (01/2009), Chronic thoracic back pain, COPD (chronic obstructive pulmonary disease) (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  has a past surgical history that includes Tubal ligation; Abdominal aortic aneurysm repair; Femoral artery stent (Bilateral); Closed reduction nasal fracture (11/2007); Carotid endarterectomy (Left, 01/02/2011); Nasal sinus surgery; left heart catheterization with coronary/graft angiogram (N/A, 04/22/2011); left heart catheterization with coronary/graft angiogram (N/A, 10/10/2011); Fracture surgery; Dilation and curettage of uterus; Cataract extraction w/ intraocular lens  implant, bilateral; Coronary angioplasty with stent (10/10/2011); Cardiac catheterization (N/A, 01/02/2016); Cardiac catheterization (1996; 1999); Coronary artery bypass graft (1999); Excision basal cell carcinoma; Squamous cell carcinoma excision; Cardiac catheterization (N/A, 06/25/2016); SVT ABLATION (N/A, 03/19/2018); Cardioversion (N/A, 10/19/2019); and Cardioversion (N/A, 04/25/2020). Prior to Admission medications   Medication Sig Start Date End Date Taking? Authorizing Provider  acetaminophen (TYLENOL) 650 MG CR tablet Take 1,300 mg by mouth 2 (two) times daily.   Yes [provider]  albuterol (PROVENTIL) (2.5 MG/3ML) 0.083% nebulizer solution Take 3 mLs (2.5 mg total) by  nebulization every 4 (four) hours as needed for wheezing or shortness of breath. Dx:J44.9 03/25/19  Yes Kasa, Maretta Bees, MD  albuterol (PROVENTIL) (5 MG/ML) 0.5% nebulizer solution Take 0.5 mLs (2.5 mg total) by nebulization every 6 (six) hours as needed for wheezing or  shortness of breath. 04/10/20  Yes Charlesetta Shanks, MD  albuterol (VENTOLIN HFA) 108 (90 Base) MCG/ACT inhaler Inhale 2 puffs into the lungs every 6 (six) hours as needed for wheezing or shortness of breath. 03/05/21  Yes Parrett, Tammy S, NP  azelastine (ASTELIN) 0.1 % nasal spray Place 2 sprays into both nostrils at bedtime as needed for rhinitis. 06/28/18  Yes Mosie Lukes, MD  Biotin 5 MG TABS Take 5 mg by mouth daily.    Yes [provider]  Calcium Carbonate-Vitamin D (CALCIUM + D PO) Take 1 tablet by mouth 2 (two) times a week.    Yes [provider]  Cholecalciferol (VITAMIN D3) 2000 units TABS Take 2,000 Units by mouth daily.    Yes [provider]  clopidogrel (PLAVIX) 75 MG tablet TAKE 1 TABLET BY MOUTH ONCE A DAY Patient taking differently: Take 75 mg by mouth daily. 01/09/21  Yes Gollan, Kathlene November, MD  diltiazem (CARDIZEM CD) 240 MG 24 hr capsule TAKE 1 CAPSULE BY MOUTH ONCE DAILY Patient taking differently: 240 mg daily. 09/12/20  Yes Bhagat, Bhavinkumar, PA  diltiazem (CARDIZEM) 30 MG tablet Take one tablet by mouth every 4 hours AS NEEDED for A-fib HR > 100 as long as BP > 100. 11/11/18  Yes Fenton, Clint R, PA  dofetilide (TIKOSYN) 125 MCG capsule TAKE 1 CAPSULE BY MOUTH TWICE DAILY Patient taking differently: Take 125 mcg by mouth 2 (two) times daily. 02/26/21  Yes Baldwin Jamaica, PA-C  ezetimibe (ZETIA) 10 MG tablet TAKE 1 TABLET BY MOUTH DAILY Patient taking differently: Take 10 mg by mouth daily. 01/09/21  Yes Gollan, Kathlene November, MD  ferrous fumarate (HEMOCYTE - 106 MG FE) 325 (106 Fe) MG TABS tablet Take 1 tablet (106 mg of iron total) by mouth daily. 01/15/21  Yes Mosie Lukes, MD   FLUoxetine (PROZAC) 40 MG capsule Take 1 capsule (40 mg total) by mouth daily. 02/27/21  Yes Mosie Lukes, MD  Fluticasone-Umeclidin-Vilant (TRELEGY ELLIPTA) 100-62.5-25 MCG/INH AEPB INAHLE 1 PUFF INTO THE LUNGS DAILY Patient taking differently: Inhale 1 puff into the lungs daily. 02/17/21  Yes Collene Gobble, MD  folic acid (FOLVITE) 1 MG tablet Take 1 tablet (1 mg total) by mouth daily. 01/15/21  Yes Mosie Lukes, MD  furosemide (LASIX) 20 MG tablet Take 1 tablet (20 mg total) by mouth as directed. Take 1 tablet (20 mg) daily and extra 1 tablet (20 mg) at 2 PM for shortness of breath or abdominal swelling 01/27/20  Yes Gollan, Kathlene November, MD  HYDROcodone-acetaminophen (NORCO) 10-325 MG tablet Take 1 tablet by mouth every 6 (six) hours as needed for severe pain. 04/02/21  Yes Mosie Lukes, MD  ipratropium (ATROVENT) 0.02 % nebulizer solution Take 2.5 mLs (0.5 mg total) by nebulization 4 (four) times daily. Patient taking differently: Take 0.5 mg by nebulization every 4 (four) hours as needed. 04/10/20  Yes Charlesetta Shanks, MD  isosorbide mononitrate (IMDUR) 60 MG 24 hr tablet TAKE 1 TABLET BY MOUTH TWICE A DAY Patient taking differently: 60 mg 2 (two) times daily. 11/09/20  Yes Gollan, Kathlene November, MD  Krill Oil 500 MG CAPS Take 500 mg by mouth daily.    Yes [provider]  levothyroxine (SYNTHROID) 75 MCG tablet Take 1 tablet (75 mcg total) by mouth daily. 01/10/21  Yes Mosie Lukes, MD  nitroGLYCERIN (NITROSTAT) 0.4 MG SL tablet Place 1 tablet (0.4 mg total) under the tongue every 5 (five) minutes as needed  for chest pain. 06/22/20  Yes Baldwin Jamaica, PA-C  omeprazole (PRILOSEC) 20 MG capsule TAKE 1 CAPSULE BY MOUTH ONCE DAILY Patient taking differently: 20 mg daily. 01/08/21  Yes Mosie Lukes, MD  ondansetron (ZOFRAN) 4 MG tablet Take 1 tablet (4 mg total) by mouth every 8 (eight) hours as needed for nausea or vomiting. 03/22/21 03/22/22 Yes Parrett, Tammy S, NP  Polyethyl  Glycol-Propyl Glycol (SYSTANE OP) Place 1 drop into both eyes 2 (two) times daily as needed (dry/irritated eyes.).    Yes [provider]  potassium chloride (KLOR-CON) 10 MEQ tablet Take 1 tablet (10 mEq total) by mouth daily. And take 2 tablets (29meq) on days you take furosemide 04/26/20  Yes Baldwin Jamaica, PA-C  rosuvastatin (CRESTOR) 40 MG tablet TAKE 1 TABLET BY MOUTH ONCE DAILY Patient taking differently: Take 40 mg by mouth daily. 01/09/21  Yes Minna Merritts, MD  zinc gluconate 50 MG tablet Take 50 mg by mouth daily.   Yes [provider]  albuterol (VENTOLIN HFA) 108 (90 Base) MCG/ACT inhaler Inhale into the lungs every 6 (six) hours as needed for wheezing or shortness of breath. Patient not taking: Reported on 04/11/2021    [provider]  ELIQUIS 2.5 MG TABS tablet TAKE 1 TABLET BY MOUTH TWICE A DAY Patient taking differently: Take 2.5 mg by mouth 2 (two) times daily. 02/26/21   Camnitz, Ocie Doyne, MD   Allergies  Allergen Reactions   Erythromycin Other (See Comments)    Made the patient's tongue "burn"   Tape Rash and Other (See Comments)    PLEASE USE PAPER TAPE!!   Meperidine Nausea And Vomiting   Shellfish Allergy Nausea And Vomiting   Ciprofloxacin Rash and Other (See Comments)    Rash from IV   Codeine Nausea And Vomiting   Latex Rash and Other (See Comments)    Made the patient's skin "burn," also   Penicillins Rash and Other (See Comments)    Has patient had a PCN reaction causing immediate rash, facial/tongue/throat swelling, SOB or lightheadedness with hypotension: Unk Has patient had a PCN reaction causing severe rash involving mucus membranes or skin necrosis: No Has patient had a PCN reaction that required hospitalization: No Has patient had a PCN reaction occurring within the last 10 years: No If all of the above answers are "NO", then may proceed with Cephalosporin use.     FAMILY HISTORY:  family history includes Alcohol  abuse in her brother; Aneurysm in her brother; Angina in her father; Cirrhosis in her maternal grandfather; Colon cancer (age of onset: 71) in her father; Diabetes in her maternal grandmother and mother; Heart attack in her brother and brother; Heart attack (age of onset: 21) in her mother; Heart disease in her brother, brother, and father; Hyperlipidemia in her daughter; Hypertension in her brother and father; Hypothyroidism in her sister; Lung cancer in her father and sister; Obesity in her daughter and son; Stroke in her maternal grandmother and paternal grandmother. SOCIAL HISTORY:  reports that she quit smoking about 28 years ago. Her smoking use included cigarettes. She started smoking about 64 years ago. She has a 20.00 pack-year smoking history. She has never used smokeless tobacco. She reports current alcohol use. She reports that she does not use drugs.  REVIEW OF SYSTEMS:   10 point review of system taken, please see HPI for positives and negatives. Positive chest wall pain with deep breath Trace hemoptysis for the last month  SUBJECTIVE:  80 year old female with obvious discomfort with deep breathing and cough VITAL SIGNS: Temp:  [97.9 F (36.6 C)-98.2 F (36.8 C)] 98.2 F (36.8 C) (11/23 0957) Pulse Rate:  [64-95] 95 (11/23 0957) Resp:  [18-20] 20 (11/23 0957) BP: (86-102)/(53-74) 86/74 (11/23 0957) SpO2:  [86 %-93 %] 86 % (11/23 0957) FiO2 (%):  [70 %-80 %] 80 % (11/23 0957) Weight:  [57.5 kg] 57.5 kg (11/23 0421)  PHYSICAL EXAMINATION: General: Frail cachectic female who is not in acute respiratory distress at this time. Neuro: Forgetful somewhat agitated is obviously in pain HEENT: No JVD or lymphadenopathy is appreciated Cardiovascular: Heart sounds are distant Lungs: Manage breath sounds throughout increased chest wall pain with deep breath Abdomen: Positive bowel sounds Musculoskeletal: Grossly intact Skin: Warm and dry  Recent Labs  Lab 04/15/21 0039  04/16/21 0147 04/17/21 0053  NA 131* 136 134*  K 4.0 3.8 4.2  CL 93* 97* 98  CO2 30 29 27   BUN 10 14 15   CREATININE 0.74 0.84 0.84  GLUCOSE 165* 153* 117*   Recent Labs  Lab 04/15/21 0039 04/16/21 0147 04/17/21 0053  HGB 9.0* 8.5* 8.7*  HCT 29.9* 27.6* 29.6*  WBC 8.7 12.3* 14.9*  PLT 231 264 282   DG Chest 1 View  Result Date: 04/15/2021 CLINICAL DATA:  Dyspnea EXAM: CHEST  1 VIEW COMPARISON:  04/15/2021 FINDINGS: Single frontal view of the chest demonstrates persistent left upper lobe mass with suspected lymphangitic spread of disease throughout the left upper lobe. Emphysematous changes are seen throughout the right lung. No effusion or pneumothorax. Erosive change left posterolateral fifth rib consistent with direct invasion by the adjacent left upper lobe mass as per recent CT. IMPRESSION: 1. Stable left upper lobe mass and lymphangitic spread. 2. Stable left fifth rib erosive change consistent with direct invasion of tumor. The right posterior ninth rib lytic lesion seen on CT is not readily apparent by x-ray. Electronically Signed   By: Randa Ngo M.D.   On: 04/15/2021 19:48   DG Chest Port 1 View  Result Date: 04/15/2021 CLINICAL DATA:  Status post biopsy EXAM: PORTABLE CHEST 1 VIEW COMPARISON:  Radiograph 04/14/2021. FINDINGS: Progressive volume loss in the left hemithorax with subtotal opacification. Decreased aeration of the left perihilar and lower lung zone. No visualized pneumothorax. Skin fold projects over the right hemithorax. Patient is post median sternotomy. Heart size and mediastinal contours are obscured. Emphysematous changes are again seen. IMPRESSION: 1. No visualized pneumothorax. 2. Progressive volume loss in the left hemithorax with subtotal opacification. This may be due to combination of progressive atelectasis/collapse and or airspace disease/hemorrhage. 3. Skin fold projects over the right chest. Electronically Signed   By: Keith Rake M.D.   On:  04/15/2021 18:01   CT LUNG MASS BIOPSY  Result Date: 04/15/2021 INDICATION: 80 year old woman with left upper lobe lung mass presents to IR for CT-guided biopsy EXAM: CT-guided biopsy of left lung mass MEDICATIONS: None. ANESTHESIA/SEDATION: Moderate (conscious) sedation was employed during this procedure. A total of Versed 1 mg and Fentanyl 25 mcg was administered intravenously. Moderate Sedation Time: 10 minutes. The patient's level of consciousness and vital signs were monitored continuously by radiology nursing throughout the procedure under my direct supervision. COMPLICATIONS: SIR Level A - No therapy, no consequence. Moderate hemoptysis PROCEDURE: Informed written consent was obtained from the patient after a thorough discussion of the procedural risks, benefits and alternatives. All questions were addressed. Maximal Sterile Barrier Technique was utilized including caps, mask, sterile gowns, sterile gloves, sterile  drape, hand hygiene and skin antiseptic. A timeout was performed prior to the initiation of the procedure. Patient positioned supine on the CT table. The left anterior chest wall skin prepped and draped in usual fashion. Utilizing CT guidance, 17 gauge introducer needle was advanced into the left upper lobe lung mass and 3-18 gauge cores were obtained. All samples were sent to pathology in formalin. The patient developed moderate hemoptysis after the biopsy requiring supplemental oxygen. Patient's hemoptysis stopped after she was positioned left lateral decubitus. IMPRESSION: CT-guided biopsy of left upper lobe lung mass. Electronically Signed   By: Miachel Roux M.D.   On: 04/15/2021 15:48     Principal Problem:   Pulmonary embolism (HCC) Acute hypoxic respiratory failure Left lung mass status post CT-guided biopsy on 04/15/2021 Active Problems:   CAD (coronary artery disease)   Hypothyroidism   Chronic respiratory failure with hypoxia (HCC)   Atrial fibrillation, chronic (HCC)    COPD (chronic obstructive pulmonary disease) (HCC)   Chronic diastolic CHF (congestive heart failure) (HCC)   Lung mass   Pathological fracture of one rib   Microcytic anemia   Malnutrition of moderate degree  Discussion: 80 year old female who is a pulmonary patient of Dr. Lamonte Sakai who was scheduled for a CT-guided biopsy of left lung mass on 04/15/2021 but on 04/12/2021 during a coughing paroxysm and she fractured rib.  She presented to the emergency department time for evaluation and treatment she reported she been having hemoptysis off and on for 1 month..  Although there is conflicting information concerning her hemoptysis.  She was treated with analgesics along with supplemental oxygen but continued to decline and required increasing levels of oxygen until 04/17/2021 she is on 40 L with 80% FiO2 with low saturations and 90s.  The results of her CT-guided biopsy are not back yet.  Her PET scan on 1027 was suggestive of bronchogenic carcinoma.  With mets to the ribs.  Pulmonary critical care called to the bedside 04/17/2021 due to increasing FiO2 needs.  This may be multifactorial secondary to shallow respirations from chest pain from fractured rib along with possible lymphangitic spread of carcinoma.    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  There is metastasis to the ribs. She has increased pain with coughing due to fractured rib has been receiving Dilaudid and has been bedridden since probably a component of atelectasis.     PLAN: Await pathology results Heparin infusion for PE and Afib Broncho dilators Steroids Oxygen as needed Pulmonary toilet as tolerated Medications for comfort She is a DNR   Richardson Landry Armina Galloway ACNP Acute Care Nurse Practitioner [place] Please consult [place] 04/17/2021, 10:40 AM

## 2021-04-18 DIAGNOSIS — Z7189 Other specified counseling: Secondary | ICD-10-CM | POA: Diagnosis not present

## 2021-04-18 DIAGNOSIS — I2699 Other pulmonary embolism without acute cor pulmonale: Secondary | ICD-10-CM | POA: Diagnosis not present

## 2021-04-18 DIAGNOSIS — J90 Pleural effusion, not elsewhere classified: Secondary | ICD-10-CM

## 2021-04-18 DIAGNOSIS — R918 Other nonspecific abnormal finding of lung field: Secondary | ICD-10-CM | POA: Diagnosis not present

## 2021-04-18 DIAGNOSIS — J9601 Acute respiratory failure with hypoxia: Secondary | ICD-10-CM | POA: Diagnosis not present

## 2021-04-18 LAB — CBC
HCT: 31.4 % — ABNORMAL LOW (ref 36.0–46.0)
Hemoglobin: 9.4 g/dL — ABNORMAL LOW (ref 12.0–15.0)
MCH: 22.8 pg — ABNORMAL LOW (ref 26.0–34.0)
MCHC: 29.9 g/dL — ABNORMAL LOW (ref 30.0–36.0)
MCV: 76.2 fL — ABNORMAL LOW (ref 80.0–100.0)
Platelets: 216 10*3/uL (ref 150–400)
RBC: 4.12 MIL/uL (ref 3.87–5.11)
RDW: 19.1 % — ABNORMAL HIGH (ref 11.5–15.5)
WBC: 10.7 10*3/uL — ABNORMAL HIGH (ref 4.0–10.5)
nRBC: 0 % (ref 0.0–0.2)

## 2021-04-18 LAB — HEPARIN LEVEL (UNFRACTIONATED)
Heparin Unfractionated: 0.84 IU/mL — ABNORMAL HIGH (ref 0.30–0.70)
Heparin Unfractionated: 0.74 IU/mL — ABNORMAL HIGH (ref 0.30–0.70)

## 2021-04-18 LAB — BASIC METABOLIC PANEL
Anion gap: 8 (ref 5–15)
BUN: 13 mg/dL (ref 8–23)
CO2: 29 mmol/L (ref 22–32)
Calcium: 8.4 mg/dL — ABNORMAL LOW (ref 8.9–10.3)
Chloride: 97 mmol/L — ABNORMAL LOW (ref 98–111)
Creatinine, Ser: 0.7 mg/dL (ref 0.44–1.00)
GFR, Estimated: >60 mL/min (ref >60)
Glucose, Bld: 140 mg/dL — ABNORMAL HIGH (ref 70–99)
Potassium: 4.1 mmol/L (ref 3.5–5.1)
Sodium: 134 mmol/L — ABNORMAL LOW (ref 135–145)

## 2021-04-18 MED ORDER — SENNA 8.6 MG PO TABS
2.0000 | ORAL_TABLET | Freq: Two times a day (BID) | ORAL | Status: DC
  Administered 2021-04-18 – 2021-04-25 (×11): 17.2 mg via ORAL
  Filled 2021-04-18 (×12): qty 2

## 2021-04-18 MED ORDER — LIDOCAINE 5 % EX PTCH
1.0000 | MEDICATED_PATCH | CUTANEOUS | Status: DC
  Administered 2021-04-18 – 2021-04-25 (×8): 1 via TRANSDERMAL
  Filled 2021-04-18 (×6): qty 1

## 2021-04-18 MED ORDER — LIDOCAINE 5 % EX PTCH
1.0000 | MEDICATED_PATCH | CUTANEOUS | Status: DC
  Administered 2021-04-19 – 2021-04-24 (×6): 1 via TRANSDERMAL
  Filled 2021-04-18 (×8): qty 1

## 2021-04-18 NOTE — Progress Notes (Signed)
Mobility Specialist Progress Note:   04/18/21 1059  Mobility  Activity Transferred:  Bed to chair  Level of Assistance Standby assist, set-up cues, supervision of patient - no hands on  Assistive Device None  Distance Ambulated (ft) 4 ft  Mobility Ambulated with assistance in room  Mobility Response Tolerated well  Mobility performed by Mobility specialist  Bed Position Chair  $Mobility charge 1 Mobility   Pre- Mobility: 94% SpO2 During Mobility: 91% SpO2 Post Mobility:  92% SpO2  Pt received in bed willing to participate in mobility. No complaints of pain and asymptomatic. Pt left in chair with call bell in reach and all needs met.   [facility] Public librarian Phone [phone] Secondary Phone [phone]

## 2021-04-18 NOTE — Plan of Care (Signed)

## 2021-04-18 NOTE — Progress Notes (Addendum)
PROGRESS NOTE   Andrea [name]  IRW:[id number]    DOB: [birth year]    DOA: 04/10/2021  PCP: Mosie Lukes, MD   I have briefly reviewed patients previous medical records in [facility].  Chief complaint: Dyspnea   Brief Narrative:  80 year old female, medical history significant for COPD, OSA on CPAP, CAD s/p CABG, hypertension, PAD, former smoker, history of PE in the past after her CABG, A. fib, chronic diastolic CHF, hypothyroid, recently saw pulmonology/Dr. Lamonte Sakai on 04/02/2021 for a left upper lobe mass and lytic lesion on the posterior right ninth rib identified on CT chest 03/07/2021, significantly hypermetabolic on PET 76/19/5093, planned outpatient biopsy on 04/15/2021, now admitted 04/10/2021 with complaints of acute onset of severe right-sided rib cage pain, worse with coughing and dyspnea.  CT chest showed left upper lobe with bland thrombus versus tumor thrombus, large area of consolidation in the left upper lobe with lymphangitic spread, right posterior ninth rib lesion with pathological fracture.  Admitted for large left upper lobe mass, suspecting primary lung cancer with acute pulmonary embolism/possible tumor thrombus, pathological ninth rib fracture, postobstructive pneumonia, possible COPD exacerbation and acute on chronic hypoxic respiratory failure.  S/p lung mass biopsy by IR on 11/21.  Hospital course complicated by progressive acute respiratory failure with hypoxia requiring HFNC and postprocedure mild hemoptysis.  PCCM was consulted on 11/23.   Assessment & Plan:  Principal Problem:   Pulmonary embolism (HCC) Active Problems:   CAD (coronary artery disease)   Hypothyroidism   Chronic respiratory failure with hypoxia (HCC)   Atrial fibrillation, chronic (HCC)   COPD (chronic obstructive pulmonary disease) (HCC)   Chronic diastolic CHF (congestive heart failure) (HCC)   Lung mass   Pathological fracture of one rib   Microcytic anemia   Malnutrition of  moderate degree   Large left upper lobe mass, presumed primary lung cancer, complicated by right posterior ninth rib fracture, lymphangitic spread to LUL and pleuritic chest pain, hemoptysis: - Was supposed to have outpatient evaluation including biopsy but had to be hospitalized - CT-guided needle biopsy by IR on 04/15/2021.  Pathology confirms adenocarcinoma (I have not yet discussed this finding with patient) - PCCM consultation 11/23 appreciated.  Concerned that her overall prognosis, appropriateness to tolerate treatment is poor and may be heading in the direction of palliative care. - Supportive treatment of dyspnea/hypoxia with oxygen supplements, pulmonary toilet and pain with multimodality pain control.  If hemoptysis were to worsen then may have to stop anticoagulation. - Palliative care follow-up appreciated.  Is DNR/DNI, wants to explore treatment options-await palliative care follow-up. - Repeat chest x-ray 11/23 evening showed complete whiteout of left lung fields which I discussed with Dr. Lamonte Sakai 11/23.  It could be multifactorial-pleural effusion, consolidation or collapse.   - Plan is for PCCM to reassess today to see if patient has any drainable pleural fluid for symptom relief.  Awaiting PCCM follow-up-discussed with their team this morning.  Left upper lobe PE (bland thrombus versus tumor thrombus): - Continue IV heparin anticoagulation for now. - 2D echo showed LVEF of 50-55%, moderate hypokinesis of left ventricle based mid inferior wall and inferior septal wall.  Suspected left upper lobe postobstructive pneumonia: - Continue empiric IV antibiotics.  Day 5 of IV ceftriaxone today.  Complete total 7 days course.  1 cm right upper lobe subpleural nodule: - Outpatient follow-up.  May be part of the above malignant process.  COPD exacerbation: - Emphysema noted on CTA chest - Continue IV Solu-Medrol, IV antibiotics,  inhaled steroids.  Acute respiratory failure with  hypoxia - Due to large left upper lobe lung mass, suspected primary bronchogenic carcinoma with mets, postobstructive pneumonia, pulmonary embolism, COPD exacerbation. - Currently on HFNC 20 L/min at 90% FiO2 and saturating in the low 90s. - Chest x-ray 11/23 as documented above.  Microcytic anemia - May be related to anemia of chronic disease or iron deficiency anemia - Stable.  Paroxysmal A. fib: - Continue dofetilide and diltiazem for rate control.  Issues with hypotension 11/23.  Hypotension has resolved but needs to be closely monitored. - Continue IV heparin for now.  Hypotension - Discontinue Imdur.  Resolved for now.  Chronic diastolic CHF - Clinically euvolemic.  CAD - May have to hold nitrates due to hypotension  Hyperlipidemia - Continue statins.  Oral thrush - Continue nystatin swish and swallow.  Improving.  Depression - Continue fluoxetine  Hypothyroid - Continue levothyroxine  Body mass index is 22.75 kg/m.  Nutritional Status Nutrition Problem: Moderate Malnutrition Etiology: chronic illness (COPD, CHF) Signs/Symptoms: moderate fat depletion, severe muscle depletion Interventions: Boost Breeze, MVI  Pressure Ulcer:     DVT prophylaxis:   Currently on IV heparin drip   Code Status: DNR Family Communication: I discussed in detail with patient's son on 11/24, updated care and answered all questions. Disposition:  Status is: Inpatient  Remains inpatient appropriate because: Acute respiratory failure with hypoxia on HFNC 25 L/min, FiO2 80%, on IV antibiotics for pneumonia, IV heparin for PE and critically ill.        Consultants:   Interventional radiology PCCM  Procedures:   CT-guided needle biopsy of lung mass by interventional radiology on 11/21  Antimicrobials:   IV ceftriaxone 11/20 >   Subjective:  Cough and hemoptysis are minimal and improved.  Decreased appetite.  Indicates bilateral lower posterior rib cage pain, worse with  movement or deep breathing, can get up to 10/10 in severity.  Dyspnea on mild exertion.  Objective:   Vitals:   04/17/21 2102 04/18/21 0100 04/18/21 0413 04/18/21 0751  BP: (!) 106/51  111/83   Pulse: 75  80 74  Resp: 20  18 (!) 24  Temp: 98.1 F (36.7 C)  98.1 F (36.7 C)   TempSrc: Oral  Oral   SpO2: 94% 91% 94% 93%  Weight:   56.4 kg   Height:        General exam: Elderly female, moderately built, frail, chronically ill looking and cachectic, lying propped up in bed and in no distress.  Bitemporal wasting.  Oral thrush improved. Respiratory system: Absent breath sounds left upper lung fields and bronchial breath sounds in the left lower lung fields.  Harsh breath sounds right lung fields.  No increased work of breathing. Cardiovascular system: S1 and S2 heard, RRR.  No JVD, murmurs or pedal edema.  Telemetry personally reviewed: Sinus rhythm. Gastrointestinal system: Abdomen is nondistended, soft and nontender. No organomegaly or masses felt. Normal bowel sounds heard. Central nervous system: Alert and oriented. No focal neurological deficits. Extremities: Symmetric 5 x 5 power. Skin: No rashes, lesions or ulcers Psychiatry: Judgement and insight appear normal. Mood & affect appropriate.     Data Reviewed:   I have personally reviewed following labs and imaging studies   CBC: Recent Labs  Lab 04/15/21 0039 04/16/21 0147 04/17/21 0053 04/18/21 0132  WBC 8.7 12.3* 14.9* 10.7*  NEUTROABS 8.4*  --   --   --   HGB 9.0* 8.5* 8.7* 9.4*  HCT 29.9* 27.6* 29.6* 31.4*  MCV 75.3* 75.8* 76.9* 76.2*  PLT 231 264 282 371    Basic Metabolic Panel: Recent Labs  Lab 04/12/21 0418 04/14/21 0150 04/15/21 0039 04/16/21 0147 04/17/21 0053 04/18/21 0132  NA 132*  --  131* 136 134* 134*  K 4.5  --  4.0 3.8 4.2 4.1  CL 96*  --  93* 97* 98 97*  CO2 29  --  30 29 27 29   GLUCOSE 105*  --  165* 153* 117* 140*  BUN 10  --  10 14 15 13   CREATININE 0.90  --  0.74 0.84 0.84 0.70   CALCIUM 8.3*  --  8.5* 8.7* 8.6* 8.4*  MG  --  2.0  --   --   --   --     Liver Function Tests: No results for input(s): AST, ALT, ALKPHOS, BILITOT, PROT, ALBUMIN in the last 168 hours.  CBG: No results for input(s): GLUCAP in the last 168 hours.  Microbiology Studies:   Recent Results (from the past 240 hour(s))  Resp Panel by RT-PCR (Flu A&B, Covid) Nasopharyngeal Swab     Status: None   Collection Time: 04/11/21  3:04 AM   Specimen: Nasopharyngeal Swab; Nasopharyngeal(NP) swabs in vial transport medium  Result Value Ref Range Status   SARS Coronavirus 2 by RT PCR NEGATIVE NEGATIVE Final    Comment: (NOTE) SARS-CoV-2 target nucleic acids are NOT DETECTED.  The SARS-CoV-2 RNA is generally detectable in upper respiratory specimens during the acute phase of infection. The lowest concentration of SARS-CoV-2 viral copies this assay can detect is 138 copies/mL. A negative result does not preclude SARS-Cov-2 infection and should not be used as the sole basis for treatment or other patient management decisions. A negative result may occur with  improper specimen collection/handling, submission of specimen other than nasopharyngeal swab, presence of viral mutation(s) within the areas targeted by this assay, and inadequate number of viral copies(<138 copies/mL). A negative result must be combined with clinical observations, patient history, and epidemiological information. The expected result is Negative.  Fact Sheet for Patients:  EntrepreneurPulse.com.au  Fact Sheet for Healthcare Providers:  IncredibleEmployment.be  This test is no t yet approved or cleared by the [place] FDA and  has been authorized for detection and/or diagnosis of SARS-CoV-2 by FDA under an Emergency Use Authorization (EUA). This EUA will remain  in effect (meaning this test can be used) for the duration of the COVID-19 declaration under Section 564(b)(1) of the  Act, 21 U.S.C.section 360bbb-3(b)(1), unless the authorization is terminated  or revoked sooner.       Influenza A by PCR NEGATIVE NEGATIVE Final   Influenza B by PCR NEGATIVE NEGATIVE Final    Comment: (NOTE) The Xpert Xpress SARS-CoV-2/FLU/RSV plus assay is intended as an aid in the diagnosis of influenza from Nasopharyngeal swab specimens and should not be used as a sole basis for treatment. Nasal washings and aspirates are unacceptable for Xpert Xpress SARS-CoV-2/FLU/RSV testing.  Fact Sheet for Patients: EntrepreneurPulse.com.au  Fact Sheet for Healthcare Providers: IncredibleEmployment.be  This test is not yet approved or cleared by the [place] FDA and has been authorized for detection and/or diagnosis of SARS-CoV-2 by FDA under an Emergency Use Authorization (EUA). This EUA will remain in effect (meaning this test can be used) for the duration of the COVID-19 declaration under Section 564(b)(1) of the Act, 21 U.S.C. section 360bbb-3(b)(1), unless the authorization is terminated or revoked.  Performed at [facility], [place],  [place] [address]     Radiology Studies:  DG CHEST PORT 1 VIEW  Result Date: 04/17/2021 CLINICAL DATA:  Acute respiratory failure with hypoxia. EXAM: PORTABLE CHEST 1 VIEW COMPARISON:  Chest x-ray dated April 15, 2021. FINDINGS: Progressive now complete opacification of the left hemithorax. Emphysematous right lung. No pneumothorax. Stable heart size. No significant mediastinal shift. IMPRESSION: 1. Progressive now complete opacification of the left hemithorax which could reflect large pleural effusion or progressive consolidation/collapse. Electronically Signed   By: Titus Dubin M.D.   On: 04/17/2021 15:57    Scheduled Meds:    cyclobenzaprine  5 mg Oral TID   diltiazem  240 mg Oral Daily   dofetilide  125 mcg Oral BID   feeding supplement  1 Container Oral TID BM    FLUoxetine  40 mg Oral Daily   ipratropium-albuterol  3 mL Nebulization Q6H   levothyroxine  75 mcg Oral Daily   mouth rinse  15 mL Mouth Rinse BID   methylPREDNISolone (SOLU-MEDROL) injection  40 mg Intravenous Q24H   multivitamin with minerals  1 tablet Oral Daily   nystatin  5 mL Oral QID   pantoprazole  40 mg Oral Daily   rosuvastatin  40 mg Oral Daily   senna  2 tablet Oral BID   sodium chloride flush  3 mL Intravenous Q12H    Continuous Infusions:    cefTRIAXone (ROCEPHIN)  IV 1 g (04/17/21 1344)   heparin 1,650 Units/hr (04/18/21 1024)     LOS: 6 days     Vernell Leep, MD,  FACP, [facility], [facility], [facility] (Care Management Physician Certified) [place]  To contact the attending provider between 7A-7P or the covering provider during after hours 7P-7A, please log into the web site [email or web address] and access using universal  password for that web site. If you do not have the password, please call the hospital operator.  04/18/2021, 1:21 PM

## 2021-04-18 NOTE — Progress Notes (Signed)
NAME:  Andrea [name], MRN:  [id number], DOB:  10-07-1940, LOS: 6 ADMISSION DATE:  04/10/2021, CONSULTATION DATE:  11/16 REFERRING MD:  Algis Liming, CHIEF COMPLAINT:  Dyspnea   History of Present Illness:  80 y/o female with COPD followed by Dr. Lamonte Sakai who was admitted for acute on chronic respiratory failure with hypoxemia due to a large left upper lobe mass, possibly post obstructive pneumonia and a left upper lobe pulmonary emboli.  Found to have a left pleural effusion on CXR on 11/23.  Mass biopsy this admission showed adenocarcinoma.  Admitted on 11/18 for cough with rib fracture and hemoptysis.  Pertinent  Medical History  Asthma CAD, c/p CABG COPD Depression GERD Hyperlipidemia Hypertension Hypothyroidism Osteoporosis PE 1999   Significant Hospital Events: Including procedures, antibiotic start and stop dates in addition to other pertinent events   11/17 CT angiogram chest > left upper lobe thrombus no RV strain, left upper lobe consolidation, lymphangitic spread, right posterior ninth rib fracture, cardiomegaly 17/00 TTE RV systolic function not well visualized, RV size normal, PA pressure 38.5 mmHg 11/21 lung mass biopsy> adenocarcinoma  Interim History / Subjective:  Feels lousy  Objective   Blood pressure 110/60, pulse 79, temperature 98.1 F (36.7 C), temperature source Oral, resp. rate 18, height 5\' 2"  (1.575 m), weight 56.4 kg, SpO2 93 %.    FiO2 (%):  [90 %] 90 %   Intake/Output Summary (Last 24 hours) at 04/18/2021 1543 Last data filed at 04/18/2021 0413 Gross per 24 hour  Intake 412.99 ml  Output 1050 ml  Net -637.01 ml   Filed Weights   04/16/21 0500 04/17/21 0421 04/18/21 0413  Weight: 58 kg 57.5 kg 56.4 kg    Examination: General:  Resting comfortably in bed HENT: NCAT OP clear PULM: Diminished left lung clear right, normal effort CV: RRR, no mgr GI: BS+, soft, nontender MSK: normal bulk and tone Neuro: awake, alert, no distress,  MAEW   Resolved Hospital Problem list    Assessment & Plan:  Acute respiratory failure with hypoxemia due to: Left lung adenocarcinoma COPD Left upper lobe post obstructive pneumonia Pulmonary embolism Moderate left sided pleural effusion > seen on bedside ultrasound today performed by me.  Unclear if this is malignant, consider hemothorax given biopsy and heparin use  Discussion: Considering hospice/palliative care, this is reasonable given oxygen burden.  I'm not sure how much better she will feel with removal of the left lung fluid but we'll set her up for a thoracentesis on 11/25  to see if it helps.  Would prefer thora over pleur-x for now as I don't know what the source of the effusion is right now.  Plan: Thoracentesis by pulmonary on 11/25 Continue duoneb Continue solumedrol Continue high flow O2 nasal canula titrated to SaO2 > 88%  Best Practice (right click and "Reselect all SmartList Selections" daily)   Per primary Code status DNR  Labs   CBC: Recent Labs  Lab 04/14/21 0150 04/15/21 0039 04/16/21 0147 04/17/21 0053 04/18/21 0132  WBC 9.8 8.7 12.3* 14.9* 10.7*  NEUTROABS  --  8.4*  --   --   --   HGB 8.6* 9.0* 8.5* 8.7* 9.4*  HCT 28.0* 29.9* 27.6* 29.6* 31.4*  MCV 74.9* 75.3* 75.8* 76.9* 76.2*  PLT 226 231 264 282 174    Basic Metabolic Panel: Recent Labs  Lab 04/12/21 0418 04/14/21 0150 04/15/21 0039 04/16/21 0147 04/17/21 0053 04/18/21 0132  NA 132*  --  131* 136 134* 134*  K 4.5  --  4.0 3.8 4.2 4.1  CL 96*  --  93* 97* 98 97*  CO2 29  --  30 29 27 29   GLUCOSE 105*  --  165* 153* 117* 140*  BUN 10  --  10 14 15 13   CREATININE 0.90  --  0.74 0.84 0.84 0.70  CALCIUM 8.3*  --  8.5* 8.7* 8.6* 8.4*  MG  --  2.0  --   --   --   --    GFR: Estimated Creatinine Clearance: 42.9 mL/min (by C-G formula based on SCr of 0.7 mg/dL). Recent Labs  Lab 04/15/21 0039 04/16/21 0147 04/17/21 0053 04/18/21 0132  WBC 8.7 12.3* 14.9* 10.7*    Liver  Function Tests: No results for input(s): AST, ALT, ALKPHOS, BILITOT, PROT, ALBUMIN in the last 168 hours. No results for input(s): LIPASE, AMYLASE in the last 168 hours. No results for input(s): AMMONIA in the last 168 hours.  ABG    Component Value Date/Time   HCO3 31.4 (H) 08/12/2019 2025   TCO2 30 10/19/2019 0840   O2SAT 41.2 08/12/2019 2025     Coagulation Profile: Recent Labs  Lab 04/15/21 0039  INR 1.2    Cardiac Enzymes: No results for input(s): CKTOTAL, CKMB, CKMBINDEX, TROPONINI in the last 168 hours.  HbA1C: Hgb A1c MFr Bld  Date/Time Value Ref Range Status  01/10/2021 02:27 PM 6.5 4.6 - 6.5 % Final    Comment:    Glycemic Control Guidelines for People with Diabetes:Non Diabetic:  <6%Goal of Therapy: <7%Additional Action Suggested:  >8%   06/28/2018 03:33 PM 6.2 4.6 - 6.5 % Final    Comment:    Glycemic Control Guidelines for People with Diabetes:Non Diabetic:  <6%Goal of Therapy: <7%Additional Action Suggested:  >8%     CBG: No results for input(s): GLUCAP in the last 168 hours.  Critical care time: n/a    Roselie Awkward, MD [place] PCCM Pager: [phone] Cell: [phone] After 7:00 pm call Elink  [phone]

## 2021-04-18 NOTE — Progress Notes (Signed)
ANTICOAGULATION CONSULT NOTE - Follow Up Consult  Pharmacy Consult for Heparin Indication: pulmonary embolus  Allergies  Allergen Reactions   Erythromycin Other (See Comments)    Made the patient's tongue "burn"   Tape Rash and Other (See Comments)    PLEASE USE PAPER TAPE!!   Meperidine Nausea And Vomiting   Shellfish Allergy Nausea And Vomiting   Ciprofloxacin Rash and Other (See Comments)    Rash from IV   Codeine Nausea And Vomiting   Latex Rash and Other (See Comments)    Made the patient's skin "burn," also   Penicillins Rash and Other (See Comments)    Has patient had a PCN reaction causing immediate rash, facial/tongue/throat swelling, SOB or lightheadedness with hypotension: Unk Has patient had a PCN reaction causing severe rash involving mucus membranes or skin necrosis: No Has patient had a PCN reaction that required hospitalization: No Has patient had a PCN reaction occurring within the last 10 years: No If all of the above answers are "NO", then may proceed with Cephalosporin use.     Patient Measurements: Height: 5\' 2"  (157.5 cm) Weight: 56.4 kg (124 lb 6.4 oz) IBW/kg (Calculated) : 50.1 Heparin Dosing Weight: 56.7 kg  Vital Signs: Temp: 98.1 F (36.7 C) (11/24 0413) Temp Source: Oral (11/24 0413) BP: 111/83 (11/24 0413) Pulse Rate: 74 (11/24 0751)  Labs: Recent Labs    04/16/21 0147 04/17/21 0053 04/18/21 0132 04/18/21 0914  HGB 8.5* 8.7* 9.4*  --   HCT 27.6* 29.6* 31.4*  --   PLT 264 282 216  --   HEPARINUNFRC  --  0.42  --  0.84*  CREATININE 0.84 0.84 0.70  --     Estimated Creatinine Clearance: 42.9 mL/min (by C-G formula based on SCr of 0.7 mg/dL).   Medications:  Scheduled:   cyclobenzaprine  5 mg Oral TID   diltiazem  240 mg Oral Daily   dofetilide  125 mcg Oral BID   feeding supplement  1 Container Oral TID BM   FLUoxetine  40 mg Oral Daily   ipratropium-albuterol  3 mL Nebulization Q6H   levothyroxine  75 mcg Oral Daily   mouth  rinse  15 mL Mouth Rinse BID   methylPREDNISolone (SOLU-MEDROL) injection  40 mg Intravenous Q24H   multivitamin with minerals  1 tablet Oral Daily   nystatin  5 mL Oral QID   pantoprazole  40 mg Oral Daily   rosuvastatin  40 mg Oral Daily   senna  1 tablet Oral BID   sodium chloride flush  3 mL Intravenous Q12H   Infusions:   cefTRIAXone (ROCEPHIN)  IV 1 g (04/17/21 1344)   heparin 1,750 Units/hr (04/18/21 0351)    Assessment: 80 yo F found to have PE on CT scan. Patient on Eliquis PTA for afib - (last dose 11/15 PM). Heparin initiated but switched to enoxaparin 11/17 given PE treatment in active cancer patient. Decision made to switch back to heparin in preparation for biopsy. Biopsy done 11/21, patient had hemoptysis and heparin was held.   Heparin resumed 11/22 AM. Pharmacy consulted to dose heparin.   Heparin level this morning supratherapeutic at 0.84, on 1750 units/hr. CBC stable. No line issues or signs/symptoms of bleeding noted per RN.  Goal of Therapy:  Heparin level 0.3-0.7 units/ml Monitor platelets by anticoagulation protocol: Yes   Plan:  Decrease heparin to 1650 units/hr Check ~8hr heparin level Daily CBC and heparin level Monitor for signs/symptoms of bleeding   Vance Peper, PharmD PGY1  Pharmacy Resident Phone [phone] 04/18/2021 10:02 AM   Please check AMION for all [place] phone numbers After 10:00 PM, call [place] [phone]

## 2021-04-18 NOTE — Progress Notes (Signed)
ANTICOAGULATION CONSULT NOTE - Follow Up Consult  Pharmacy Consult for Heparin Indication: pulmonary embolus  Allergies  Allergen Reactions   Erythromycin Other (See Comments)    Made the patient's tongue "burn"   Tape Rash and Other (See Comments)    PLEASE USE PAPER TAPE!!   Meperidine Nausea And Vomiting   Shellfish Allergy Nausea And Vomiting   Ciprofloxacin Rash and Other (See Comments)    Rash from IV   Codeine Nausea And Vomiting   Latex Rash and Other (See Comments)    Made the patient's skin "burn," also   Penicillins Rash and Other (See Comments)    Has patient had a PCN reaction causing immediate rash, facial/tongue/throat swelling, SOB or lightheadedness with hypotension: Unk Has patient had a PCN reaction causing severe rash involving mucus membranes or skin necrosis: No Has patient had a PCN reaction that required hospitalization: No Has patient had a PCN reaction occurring within the last 10 years: No If all of the above answers are "NO", then may proceed with Cephalosporin use.     Patient Measurements: Height: 5\' 2"  (157.5 cm) Weight: 56.4 kg (124 lb 6.4 oz) IBW/kg (Calculated) : 50.1 Heparin Dosing Weight: 56.7 kg  Vital Signs: Temp: 98.1 F (36.7 C) (11/24 1934) Temp Source: Oral (11/24 1934) BP: 141/76 (11/24 1934) Pulse Rate: 85 (11/24 1934)  Labs: Recent Labs    04/16/21 0147 04/17/21 0053 04/18/21 0132 04/18/21 0914 04/18/21 1825  HGB 8.5* 8.7* 9.4*  --   --   HCT 27.6* 29.6* 31.4*  --   --   PLT 264 282 216  --   --   HEPARINUNFRC  --  0.42  --  0.84* 0.74*  CREATININE 0.84 0.84 0.70  --   --      Estimated Creatinine Clearance: 42.9 mL/min (by C-G formula based on SCr of 0.7 mg/dL).   Medications:  Scheduled:   cyclobenzaprine  5 mg Oral TID   diltiazem  240 mg Oral Daily   dofetilide  125 mcg Oral BID   feeding supplement  1 Container Oral TID BM   FLUoxetine  40 mg Oral Daily   ipratropium-albuterol  3 mL Nebulization Q6H    levothyroxine  75 mcg Oral Daily   lidocaine  1 patch Transdermal Q24H   lidocaine  1 patch Transdermal Q24H   mouth rinse  15 mL Mouth Rinse BID   methylPREDNISolone (SOLU-MEDROL) injection  40 mg Intravenous Q24H   multivitamin with minerals  1 tablet Oral Daily   nystatin  5 mL Oral QID   pantoprazole  40 mg Oral Daily   rosuvastatin  40 mg Oral Daily   senna  2 tablet Oral BID   sodium chloride flush  3 mL Intravenous Q12H   Infusions:   cefTRIAXone (ROCEPHIN)  IV 1 g (04/18/21 1433)   heparin 1,650 Units/hr (04/18/21 1755)    Assessment: 80 yo F found to have PE on CT scan. Patient on Eliquis PTA for afib - (last dose 11/15 PM). Heparin initiated but switched to enoxaparin 11/17 given PE treatment in active cancer patient. Decision made to switch back to heparin in preparation for biopsy. Biopsy done 11/21, patient had hemoptysis and heparin was held.   Heparin resumed 11/22 AM. Pharmacy consulted to dose heparin.   Heparin level continues to be supra-therapeutic at 0.74 on 1650 units/hr.   Goal of Therapy:  Heparin level 0.3-0.7 units/ml Monitor platelets by anticoagulation protocol: Yes   Plan:  Decrease heparin to  1600 units/hr Check heparin level with AM labs.   Joseph Art, Pharm.D. PGY-1 Pharmacy Resident [phone] 04/18/2021 7:44 PM

## 2021-04-18 NOTE — Progress Notes (Addendum)
Daily Progress Note   Patient Name: Andrea [name]       Date: 04/18/2021 DOB: 19-Aug-1940  Age: 80 y.o. MRN#: [id number] Attending Physician: Modena Jansky, MD Primary Care Physician: Mosie Lukes, MD Admit Date: 04/10/2021  Reason for Consultation/Follow-up: Establishing goals of care  Subjective: Feels about the same. Having some constipation.  Length of Stay: 6  Current Medications: Scheduled Meds:   cyclobenzaprine  5 mg Oral TID   diltiazem  240 mg Oral Daily   dofetilide  125 mcg Oral BID   feeding supplement  1 Container Oral TID BM   FLUoxetine  40 mg Oral Daily   ipratropium-albuterol  3 mL Nebulization Q6H   levothyroxine  75 mcg Oral Daily   lidocaine  1 patch Transdermal Q24H   lidocaine  1 patch Transdermal Q24H   mouth rinse  15 mL Mouth Rinse BID   methylPREDNISolone (SOLU-MEDROL) injection  40 mg Intravenous Q24H   multivitamin with minerals  1 tablet Oral Daily   nystatin  5 mL Oral QID   pantoprazole  40 mg Oral Daily   rosuvastatin  40 mg Oral Daily   senna  2 tablet Oral BID   sodium chloride flush  3 mL Intravenous Q12H    Continuous Infusions:  cefTRIAXone (ROCEPHIN)  IV 1 g (04/17/21 1344)   heparin 1,650 Units/hr (04/18/21 1024)    PRN Meds: acetaminophen **OR** acetaminophen, HYDROmorphone (DILAUDID) injection, ipratropium-albuterol, ondansetron **OR** ondansetron (ZOFRAN) IV, oxyCODONE, polyethylene glycol  Physical Exam Constitutional:      General: She is not in acute distress. Pulmonary:     Effort: Pulmonary effort is normal.     Comments: HFNC 20 L 90% Skin:    General: Skin is warm and dry.  Neurological:     Mental Status: She is alert and oriented to person, place, and time.            Vital Signs: BP 110/60 (BP Location: Left Arm)    Pulse 74   Temp 98.1 F (36.7 C) (Oral)   Resp (!) 21   Ht 5\' 2"  (1.575 m)   Wt 56.4 kg   SpO2 93%   BMI 22.75 kg/m  SpO2: SpO2: 93 % O2 Device: O2 Device: High Flow Nasal Cannula O2 Flow Rate: O2 Flow Rate (L/min): (S) 20 L/min  Intake/output summary:  Intake/Output Summary (Last 24 hours) at 04/18/2021 1409 Last data filed at 04/18/2021 0413 Gross per 24 hour  Intake 691.94 ml  Output 1050 ml  Net -358.06 ml   LBM: Last BM Date: 04/10/21 Baseline Weight: Weight: 56.7 kg Most recent weight: Weight: 56.4 kg       Palliative Assessment/Data: PPS 50%    Flowsheet Rows    Flowsheet Row Most Recent Value  Intake Tab   Referral Department Hospitalist  Unit at Time of Referral Cardiac/Telemetry Unit  Palliative Care Primary Diagnosis Cancer  Date Notified 04/17/21  Palliative Care Type New Palliative care  Reason for referral Clarify Goals of Care  Date of Admission 04/10/21  Date first seen by Palliative Care 04/17/21  # of days Palliative referral response time 0 Day(s)  # of days IP prior to Palliative referral 7  Clinical Assessment   Psychosocial & Spiritual Assessment   Palliative Care Outcomes        Patient Active Problem List   Diagnosis Date Noted   Malnutrition of moderate degree 04/13/2021   Pulmonary embolism ([place]) 04/11/2021   Pathological fracture of one rib 04/11/2021   Microcytic anemia 04/11/2021   Lung mass 03/05/2021   Secondary hypercoagulable state ([place]) 10/07/2019   Cor pulmonale, chronic (H[facility]) 08/17/2019   Chronic diastolic CHF (congestive heart failure) ([place]) 08/17/2019   COPD (chronic obstructive pulmonary disease) ([place]) 08/14/2019   Chronic respiratory failure with hypoxia ([place]) 08/12/2019   Atrial fibrillation, chronic ([place]) 08/12/2019   OSA (obstructive sleep apnea) 08/12/2019   Vitamin D deficiency 06/13/2019   Witnessed episode of apnea 03/07/2018   Cervical cancer screening 12/09/2017   Preventative health care 12/09/2017    Chronic ethmoidal sinusitis 09/04/2017   Hypothyroidism 09/30/2016   Presbycusis of both ears 08/05/2016   Schwannoma of cranial nerve ([place]) 08/05/2016   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   Cramps, extremity 07/03/2016   Mitral valve disease 07/03/2016   Flank pain 07/03/2016   Stable angina ([place]) 06/17/2016   Hx of CABG 12/27/2015   Hyperglycemia 10/23/2015   Unstable angina (HCC) 09/25/2015   Upper airway cough syndrome 03/23/2015   Cystitis 04/11/2014   Baker's cyst of knee 01/30/2014   Thoracic back pain 01/24/2014   Breast cancer screening 07/31/2013   Dyspnea 05/09/2013   Anosmia    Fatigue 09/17/2011   CAD (coronary artery disease) 06/06/2011   NSTEMI (non-ST elevated myocardial infarction) ([place]) 04/22/2011   Excessive somnolence disorder 12/03/2010   BACK PAIN, LUMBAR 07/16/2010   SINUSITIS, CHRONIC 05/07/2010   Cough 09/10/2009   SKIN CANCER, HX OF 04/03/2009   Coronary artery disease involving coronary bypass graft of native heart with angina pectoris ([place]) 01/24/2009   Carotid artery stenosis 01/24/2009   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 female  with past medical history of  COPD, OSA on CPAP, CAD s/p CABG, hypertension, PAD, former smoker, history of PE in the past after her CABG, A. fib, chronic diastolic CHF, and hypothyroidism admitted on 04/10/2021 with acute onset of severe right-sided rib cage pain. Patient had recently seen pulmonlolgy on 11/8 for LUL mass and lytic lesion on R 9th rib. CT chest showed left upper lobe with bland thrombus versus tumor thrombus, large area of consolidation in the  left upper lobe with lymphangitic spread, right posterior ninth rib lesion with pathological fracture.  Admitted for large left upper lobe mass, suspecting primary lung cancer with acute pulmonary embolism/possible tumor thrombus, pathological ninth rib fracture, postobstructive pneumonia, possible COPD exacerbation and acute on chronic hypoxic respiratory failure. Hospitalization has been complicated by worsening respiratory failure requiring HFNC. Patient underwent CT-guided needle biopsy by IR on 04/15/2021 and pathology is pending. PMT consulted to discuss [place].   Assessment: Follow up with patient. No family at bedside during my visit. We review our conversation from yesterday - she has no questions or concerns. She is hopeful to speak to oncology while she is inpatient. We again review that she currently remains dependent on a lot of support from HFNC and  current priority is attempting to wean HFNC.  She expresses understanding. We discussed time for outcomes. We discuss that if she remains dependent on such high amounts of support we will need to have further discussions on what her care should look like - she expresses understanding.  She reports constipation - we discuss increasing senna - she agrees.   Recommendations/Plan: DNR/DNI Continue current treatment - time for outcomes She would be interested in hearing from oncology but understands first priority is attempting to decrease amount of support she requires from [facility] She is open to further conversation with PMT, especially if she does not  begin to improve in coming days Increase senna to 2 tabs BID  Code Status: DNR  Care plan was discussed with patient  Thank you for allowing the Palliative Medicine Team to assist in the care of this patient.   Total Time 25 minutes Prolonged Time Billed  no       Greater than 50%  of this time was spent counseling and coordinating care related to the above assessment and plan.  Juel Burrow, DNP, [facility] Palliative Medicine Team Team Phone # [phone]  Pager [phone]

## 2021-04-19 ENCOUNTER — Encounter (HOSPITAL_COMMUNITY)
Admission: EM | Disposition: A | Discharge status: Hospice | Payer: Self-pay | Source: Home / Self Care | Attending: Internal Medicine

## 2021-04-19 ENCOUNTER — Encounter (HOSPITAL_COMMUNITY): Payer: Self-pay | Admitting: Internal Medicine

## 2021-04-19 ENCOUNTER — Inpatient Hospital Stay (HOSPITAL_COMMUNITY): Payer: Medicare HMO

## 2021-04-19 DIAGNOSIS — C3412 Malignant neoplasm of upper lobe, left bronchus or lung: Secondary | ICD-10-CM | POA: Diagnosis not present

## 2021-04-19 DIAGNOSIS — R918 Other nonspecific abnormal finding of lung field: Secondary | ICD-10-CM

## 2021-04-19 DIAGNOSIS — D509 Iron deficiency anemia, unspecified: Secondary | ICD-10-CM

## 2021-04-19 DIAGNOSIS — I2699 Other pulmonary embolism without acute cor pulmonale: Principal | ICD-10-CM

## 2021-04-19 DIAGNOSIS — J9601 Acute respiratory failure with hypoxia: Secondary | ICD-10-CM | POA: Diagnosis not present

## 2021-04-19 LAB — BODY FLUID CELL COUNT WITH DIFFERENTIAL
Eos, Fluid: 0 %
Lymphs, Fluid: 5 %
Monocyte-Macrophage-Serous Fluid: 5 % — ABNORMAL LOW (ref 50–90)
Neutrophil Count, Fluid: 90 % — ABNORMAL HIGH (ref 0–25)
Total Nucleated Cell Count, Fluid: 267 cu mm (ref 0–1000)

## 2021-04-19 LAB — CBC
HCT: 32.3 % — ABNORMAL LOW (ref 36.0–46.0)
Hemoglobin: 9.8 g/dL — ABNORMAL LOW (ref 12.0–15.0)
MCH: 23.2 pg — ABNORMAL LOW (ref 26.0–34.0)
MCHC: 30.3 g/dL (ref 30.0–36.0)
MCV: 76.4 fL — ABNORMAL LOW (ref 80.0–100.0)
Platelets: 215 10*3/uL (ref 150–400)
RBC: 4.23 MIL/uL (ref 3.87–5.11)
RDW: 19.1 % — ABNORMAL HIGH (ref 11.5–15.5)
WBC: 12.8 10*3/uL — ABNORMAL HIGH (ref 4.0–10.5)
nRBC: 0 % (ref 0.0–0.2)

## 2021-04-19 LAB — GLUCOSE, PLEURAL OR PERITONEAL FLUID: Glucose, Fluid: 132 mg/dL

## 2021-04-19 LAB — LACTATE DEHYDROGENASE, PLEURAL OR PERITONEAL FLUID: LD, Fluid: 96 U/L — ABNORMAL HIGH (ref 3–23)

## 2021-04-19 LAB — PROTEIN, PLEURAL OR PERITONEAL FLUID: Total protein, fluid: <3 g/dL

## 2021-04-19 LAB — ALBUMIN, PLEURAL OR PERITONEAL FLUID: Albumin, Fluid: <1.5 g/dL

## 2021-04-19 LAB — HEPARIN LEVEL (UNFRACTIONATED): Heparin Unfractionated: 0.69 IU/mL (ref 0.30–0.70)

## 2021-04-19 SURGERY — THORACENTESIS
Anesthesia: LOCAL | Laterality: Left

## 2021-04-19 MED ORDER — ALPRAZOLAM 0.25 MG PO TABS
0.2500 mg | ORAL_TABLET | Freq: Two times a day (BID) | ORAL | Status: DC | PRN
  Administered 2021-04-19 – 2021-04-25 (×8): 0.25 mg via ORAL
  Filled 2021-04-19 (×8): qty 1

## 2021-04-19 NOTE — Consult Note (Signed)
[name]  [phone] [phone]   MEDICAL ONCOLOGY - INITIAL CONSULTATION  Referral MD: Dr. 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 fib on Eliquis, CAD, chronic diastolic CHF, hypothyroidism.  Prior to admission, she had had a recently identified left lung mass.  She presented to the emergency department with acute onset severe pain in the right chest wall.  She was also experiencing coughing and felt as though she had broken a rib.  The patient was being followed by pulmonology as an outpatient and was planned to have a biopsy on 04/15/2021.  On admission, chest x-ray showed an enlarging left upper lobe mass and increasing diffuse left lung airspace disease.  CTA chest showed a left upper lobe plan thrombus versus tumor thrombus, large area of consolidation in the left upper lobe in keeping with known malignancy and associated lymphangitic spread in the left upper lobe, right posterior ninth rib lytic lesion with associated pathologic fracture.  She underwent CT-guided lung biopsy on 04/15/2021 and pathology was consistent with adenocarcinoma.  Her hospital course has been complicated by progressive acute respiratory failure with hypoxia requiring HFNC.  PCCM performed a left thoracentesis earlier today with 400 cc of fluid removed.  The fluid has been sent for cytology.  She has met with palliative care for goals of care discussion.  I met with the patient in her hospital room.  No family at the bedside.  The patient reports that she had minimal to really no improvement in her breathing following thoracentesis earlier today.  She has had a small amount of hemoptysis.  Currently on O2 at 20 L/min.  She denies headaches and dizziness.  Chest pain controlled with current pain medications.  Denies abdominal pain,  nausea, vomiting.  No bleeding other than the small amount hemoptysis reported.  The patient is a retired [employer] scientist.  She has several adult children that live locally.  They will be visiting later today.  Medical oncology was asked to see the patient to make recommendations regarding her newly diagnosed lung adenocarcinoma.  Past Medical History:  Diagnosis Date   AAA (abdominal aortic aneurysm) 01/2009   AAA (2.8 x 3.0)  moderate RAS (left); 2.7 x 2.7 cm (07/11/05)   Acute bronchitis 03/20/2013; 2017   Angina    Anosmia    Asthma    Baker's cyst of knee 01/30/2014   Basal cell carcinoma    "back and left arm"   Bradycardia    Metoprolol stopped 08/2011   CAD (coronary artery disease)    s/CABG (reports IMA and 2 SVGs) back in 1999  Myoview normal 3/10; s/p PCI with DES to PL branch of distal RCA 09/2011; PCI +DES to SVG-RCA, PCI + DES to mid LCx 12/2015   Cerebrovascular disease 01/2009   carotid u/s  R 0-39%   L 60-79%   Chronic thoracic back pain    COPD (chronic obstructive pulmonary disease) (HCC)    Depression    Dizziness    Dysrhythmia    hx of sinus brady   GERD (gastroesophageal reflux disease)    Heart murmur    Herniated lumbar disc without myelopathy    History of blood transfusion 1999   "when I had the bypass; had a PE"   Hyperglycemia 10/23/2015   Hyperlipidemia    Hypertension    Hypothyroidism 09/30/2016  Medicare annual wellness visit, subsequent 07/31/2013   NSTEMI (non-ST elevated myocardial infarction) ([place]) 11/12   Cath showed atretic IMA graft to the LAD, SVG to PD was patent but the continuation of this graft to the PL branch was occluded; there were L to R collaterals; Mid LAD had a 60 to 70% stenosis. She has been treated medically.  Neg Myoview 05/2011   Osteoarthritis of back    Osteoporosis    Pneumonia "several times"   Pulmonary embolism ([place]) 1999   "after my bypass"   PVD (peripheral vascular disease) ([place])    Shortness of breath    Squamous carcinoma     "nose"   Thyroid disease    Hypothyroid   Unstable angina ([place]) 09/25/2015  :   Past Surgical History:  Procedure Laterality Date   ABDOMINAL AORTIC ANEURYSM REPAIR     pt denies this hx on 01/02/2016   BASAL CELL CARCINOMA EXCISION     CARDIAC CATHETERIZATION N/A 01/02/2016   Procedure: Left Heart Cath and Coronary Angiography;  Surgeon: Wellington Hampshire, MD;  Location: [facility];  Service: Cardiovascular;  Laterality: N/A;   CARDIAC CATHETERIZATION  1996; 1999   CARDIAC CATHETERIZATION N/A 06/25/2016   Procedure: Left Heart Cath and Cors/Grafts Angiography;  Surgeon: Wellington Hampshire, MD;  Location: [facility];  Service: Cardiovascular;  Laterality: N/A;   CARDIOVERSION N/A 10/19/2019   Procedure: CARDIOVERSION;  Surgeon: Jerline Pain, MD;  Location: [facility];  Service: Cardiovascular;  Laterality: N/A;   CARDIOVERSION N/A 04/25/2020   Procedure: CARDIOVERSION;  Surgeon: Jerline Pain, MD;  Location: [facility] ENDOSCOPY;  Service: Cardiovascular;  Laterality: N/A;   CAROTID ENDARTERECTOMY Left 01/02/2011   CATARACT EXTRACTION W/ INTRAOCULAR LENS  IMPLANT, BILATERAL     CLOSED REDUCTION NASAL FRACTURE  11/2007   CORONARY ANGIOPLASTY WITH STENT PLACEMENT  10/10/2011   drug eluting  to rc & saphenous   CORONARY ARTERY BYPASS GRAFT  1999   "CABG X3"   DILATION AND CURETTAGE OF UTERUS     FEMORAL ARTERY STENT Bilateral    FRACTURE SURGERY     LEFT HEART CATHETERIZATION WITH CORONARY/GRAFT ANGIOGRAM N/A 04/22/2011   Procedure: LEFT HEART CATHETERIZATION WITH Beatrix Fetters;  Surgeon: Jolaine Artist, MD;  Location: [facility] CATH LAB;  Service: Cardiovascular;  Laterality: N/A;   LEFT HEART CATHETERIZATION WITH CORONARY/GRAFT ANGIOGRAM N/A 10/10/2011   Procedure: LEFT HEART CATHETERIZATION WITH Beatrix Fetters;  Surgeon: Peter M Martinique, MD;  Location: [facility] CATH LAB;  Service: Cardiovascular;  Laterality: N/A;   NASAL SINUS SURGERY     twice   SQUAMOUS CELL CARCINOMA  EXCISION     SVT ABLATION N/A 03/19/2018   Procedure: SVT ABLATION;  Surgeon: Constance Haw, MD;  Location: [place] [facility];  Service: Cardiovascular;  Laterality: N/A;   TUBAL LIGATION    :   Current Facility-Administered Medications  Medication Dose Route Frequency Provider Last Rate Last Admin   acetaminophen (TYLENOL) tablet 650 mg  650 mg Oral Q6H PRN Opyd, Ilene Qua, MD       Or   acetaminophen (TYLENOL) suppository 650 mg  650 mg Rectal Q6H PRN Opyd, Ilene Qua, MD       cefTRIAXone (ROCEPHIN) 1 g in sodium chloride 0.9 % 100 mL IVPB  1 g Intravenous Q24H Tawni Millers, MD 200 mL/hr at 04/18/21 1433 1 g at 04/18/21 1433   cyclobenzaprine (FLEXERIL) tablet 5 mg  5 mg Oral TID Arrien, Jimmy Picket,  MD   5 mg at 04/19/21 1100   diltiazem (CARDIZEM CD) 24 hr capsule 240 mg  240 mg Oral Daily Opyd, Ilene Qua, MD   240 mg at 04/19/21 1059   dofetilide (TIKOSYN) capsule 125 mcg  125 mcg Oral BID Vianne Bulls, MD   125 mcg at 04/19/21 1059   feeding supplement (BOOST / RESOURCE BREEZE) liquid 1 Container  1 Container Oral TID BM Arrien, Jimmy Picket, MD   1 Container at 04/18/21 2030   FLUoxetine (PROZAC) capsule 40 mg  40 mg Oral Daily Opyd, Ilene Qua, MD   40 mg at 04/19/21 1059   heparin ADULT infusion 100 units/mL (25000 units/265m)  1,550 Units/hr Intravenous Continuous CPat Patrick RVictory Lakesat 04/19/21 0944   HYDROmorphone (DILAUDID) injection 1-2 mg  1-2 mg Intravenous Q2H PRN Arrien, MJimmy Picket MD   1 mg at 04/17/21 0020   ipratropium-albuterol (DUONEB) 0.5-2.5 (3) MG/3ML nebulizer solution 3 mL  3 mL Nebulization Q2H PRN Arrien, MJimmy Picket MD   3 mL at 04/19/21 1142   ipratropium-albuterol (DUONEB) 0.5-2.5 (3) MG/3ML nebulizer solution 3 mL  3 mL Nebulization Q6H Arrien, MJimmy Picket MD   3 mL at 04/19/21 0747   levothyroxine (SYNTHROID) tablet 75 mcg  75 mcg Oral Daily Opyd, TIlene Qua MD   75 mcg at 04/19/21 0542   lidocaine  (LIDODERM) 5 % 1 patch  1 patch Transdermal Q24H Hongalgi, Anand D, MD       lidocaine (LIDODERM) 5 % 1 patch  1 patch Transdermal Q24H HModena Jansky MD   1 patch at 04/18/21 1430   MEDLINE mouth rinse  15 mL Mouth Rinse BID Arrien, MJimmy Picket MD   15 mL at 04/19/21 1102   methylPREDNISolone sodium succinate (SOLU-MEDROL) 40 mg/mL injection 40 mg  40 mg Intravenous Q24H ATawni Millers MD   40 mg at 04/18/21 1426   multivitamin with minerals tablet 1 tablet  1 tablet Oral Daily Arrien, MJimmy Picket MD   1 tablet at 04/19/21 1059   nystatin (MYCOSTATIN) 100000 UNIT/ML suspension 500,000 Units  5 mL Oral QID SVernelle Emerald MD   500,000 Units at 04/19/21 1156   ondansetron (ZOFRAN) tablet 4 mg  4 mg Oral Q6H PRN Opyd, TIlene Qua MD       Or   ondansetron (ZOFRAN) injection 4 mg  4 mg Intravenous Q6H PRN Opyd, TIlene Qua MD   4 mg at 04/17/21 1112   oxyCODONE (Oxy IR/ROXICODONE) immediate release tablet 10 mg  10 mg Oral Q4H PRN Arrien, MJimmy Picket MD   10 mg at 04/19/21 0542   pantoprazole (PROTONIX) EC tablet 40 mg  40 mg Oral Daily Opyd, TIlene Qua MD   40 mg at 04/19/21 1059   polyethylene glycol (MIRALAX / GLYCOLAX) packet 17 g  17 g Oral Daily PRN Opyd, TIlene Qua MD   17 g at 04/18/21 0841   rosuvastatin (CRESTOR) tablet 40 mg  40 mg Oral Daily Opyd, TIlene Qua MD   40 mg at 04/19/21 1059   senna (SENOKOT) tablet 17.2 mg  2 tablet Oral BID SPhilis Pique NP   17.2 mg at 04/19/21 1059   sodium chloride flush (NS) 0.9 % injection 3 mL  3 mL Intravenous Q12H Opyd, TIlene Qua MD   3 mL at 04/19/21 1100      Allergies  Allergen Reactions   Erythromycin Other (See Comments)    Made the patient's tongue "  burn"   Tape Rash and Other (See Comments)    PLEASE USE PAPER TAPE!!   Meperidine Nausea And Vomiting   Shellfish Allergy Nausea And Vomiting   Ciprofloxacin Rash and Other (See Comments)    Rash from IV   Codeine Nausea And Vomiting   Latex Rash and  Other (See Comments)    Made the patient's skin "burn," also   Penicillins Rash and Other (See Comments)    Has patient had a PCN reaction causing immediate rash, facial/tongue/throat swelling, SOB or lightheadedness with hypotension: Unk Has patient had a PCN reaction causing severe rash involving mucus membranes or skin necrosis: No Has patient had a PCN reaction that required hospitalization: No Has patient had a PCN reaction occurring within the last 10 years: No If all of the above answers are "NO", then may proceed with Cephalosporin use.   :   Family History  Problem Relation Age of Onset   Heart attack Mother 14   Diabetes Mother    Colon cancer Father 47   Lung cancer Father        smoked   Heart disease Father    Hypertension Father    Angina Father    Lung cancer Sister        smoked   Hypothyroidism Sister    Hypertension Brother    Heart attack Brother    Heart disease Brother        PTCA with Stent   Heart attack Brother        CABG   Heart disease Brother        CABG with 1 bypass   Aneurysm Brother        brain   Alcohol abuse Brother    Hyperlipidemia Daughter    Diabetes Maternal Grandmother    Stroke Maternal Grandmother    Cirrhosis Maternal Grandfather    Stroke Paternal Grandmother    Obesity Daughter    Obesity Son   :   Social History   Socioeconomic History   Marital status: Married    Spouse name: Not on file   Number of children: 5   Years of education: Not on file   Highest education level: Not on file  Occupational History   Occupation: Retired    Fish farm manager: RETIRED    Comment: former  Marine scientist  Tobacco Use   Smoking status: Former    Packs/day: 0.50    Years: 40.00    Pack years: 20.00    Types: Cigarettes    Start date: 1958    Quit date: 01/19/1993    Years since quitting: 28.2   Smokeless tobacco: Never  Vaping Use   Vaping Use: Never used  Substance and Sexual Activity   Alcohol use: Yes    Comment: 01/02/2016 "might  drink a glass of wine socially q 3-4 months"   Drug use: No   Sexual activity: Not Currently    Birth control/protection: Post-menopausal  Other Topics Concern   Not on file  Social History Narrative   Married with 5 children   Social Determinants of Health   Financial Resource Strain: Not on file  Food Insecurity: Not on file  Transportation Needs: Not on file  Physical Activity: Not on file  Stress: Not on file  Social Connections: Not on file  Intimate Partner Violence: Not on file  :  Review of Systems: A comprehensive 14 point review of systems was negative except as noted in the HPI.  Exam: Patient Vitals  for the past 24 hrs:  BP Temp Temp src Pulse Resp SpO2 Weight  04/19/21 0507 123/74 98.1 F (36.7 C) Oral 84 20 90 % 56.2 kg  04/19/21 0100 -- -- -- -- -- 93 % --  04/18/21 1934 (!) 141/76 98.1 F (36.7 C) Oral 85 19 (!) 89 % --  04/18/21 1435 -- -- -- 79 18 93 % --  04/18/21 1325 110/60 -- -- -- (!) 21 93 % --    General: Chronically ill-appearing female, appears tachypneic Eyes:  no scleral icterus.   ENT:  There were no oropharyngeal lesions.   Lymphatics:  Negative cervical, supraclavicular or axillary adenopathy.   Respiratory: Wheezing in the upper anterior lung fields. Cardiovascular: Occasional skipped beats, no pedal edema. GI:  abdomen was soft, flat, nontender, nondistended, without organomegaly.     Skin: No rashes, ecchymoses noted on her arms secondary to lab draws. Neuro exam was nonfocal. Patient was alert and oriented.    Lab Results  Component Value Date   WBC 12.8 (H) 04/19/2021   HGB 9.8 (L) 04/19/2021   HCT 32.3 (L) 04/19/2021   PLT 215 04/19/2021   GLUCOSE 140 (H) 04/18/2021   CHOL 121 01/10/2021   TRIG 191.0 (H) 01/10/2021   HDL 26.40 (L) 01/10/2021   LDLDIRECT 61.0 06/13/2019   LDLCALC 57 01/10/2021   ALT 9 01/10/2021   AST 11 01/10/2021   NA 134 (L) 04/18/2021   K 4.1 04/18/2021   CL 97 (L) 04/18/2021   CREATININE 0.70  04/18/2021   BUN 13 04/18/2021   CO2 29 04/18/2021    DG Chest 1 View  Result Date: 04/15/2021 CLINICAL DATA:  Dyspnea EXAM: CHEST  1 VIEW COMPARISON:  04/15/2021 FINDINGS: Single frontal view of the chest demonstrates persistent left upper lobe mass with suspected lymphangitic spread of disease throughout the left upper lobe. Emphysematous changes are seen throughout the right lung. No effusion or pneumothorax. Erosive change left posterolateral fifth rib consistent with direct invasion by the adjacent left upper lobe mass as per recent CT. IMPRESSION: 1. Stable left upper lobe mass and lymphangitic spread. 2. Stable left fifth rib erosive change consistent with direct invasion of tumor. The right posterior ninth rib lytic lesion seen on CT is not readily apparent by x-ray. Electronically Signed   By: Randa Ngo M.D.   On: 04/15/2021 19:48   DG Chest 1 View  Result Date: 04/14/2021 CLINICAL DATA:  Shortness of breath. EXAM: CHEST  1 VIEW COMPARISON:  CTA Chest 04/11/2021.  Chest x-ray 04/10/2021 FINDINGS: Interval progression of airspace consolidation in the left upper lobe, now with probable involvement in the left lower lobe and potential layering left pleural effusion. Right lung is clear. Heart size stable. Bones are diffusely demineralized. Telemetry leads overlie the chest. IMPRESSION: Interval progression of left upper and lower lobe airspace disease with associated left upper lobe mass, better demonstrated on recent chest CT. Features may reflect progressive infection or worsening postobstructive etiology. Electronically Signed   By: Misty Stanley M.D.   On: 04/14/2021 07:40   CT Angio Chest PE W and/or Wo Contrast  Result Date: 04/11/2021 CLINICAL DATA:  Concern for pulmonary embolism.  Left lung mass. EXAM: CT ANGIOGRAPHY CHEST WITH CONTRAST TECHNIQUE: Multidetector CT imaging of the chest was performed using the standard protocol during bolus administration of intravenous contrast.  Multiplanar CT image reconstructions and MIPs were obtained to evaluate the vascular anatomy. CONTRAST:  10m of Omni 350 COMPARISON:  Chest radiograph dated 04/10/2021 and  head CT dated 03/21/2021 FINDINGS: Cardiovascular: There is cardiomegaly. No pericardial effusion. There is coronary vascular calcification and postsurgical changes of CABG. Retrograde flow of contrast from the right atrium into the IVC suggestive of right heart dysfunction. Moderate atherosclerotic calcification of the thoracic aorta. There is linear nearly occlusive thrombus in the left upper lobe pulmonary artery branch. This may represent a bland thrombus versus tumor thrombus. No CT evidence of right heart stranding. Mediastinum/Nodes: Evaluation of the left hilum is limited due to consolidative changes of the left upper lobe. No mediastinal fluid collection. Lungs/Pleura: Large area of consolidation in the left upper lobe as seen on the PET-CT in keeping with known malignancy with associated lymphangitic spread in the left upper lobe. There is background of emphysema. A 1 cm right upper lobe subpleural nodule. No significant pleural effusion. No pneumothorax. The central airways are patent. Upper Abdomen: Small hiatal hernia. Musculoskeletal: A 17 mm lytic lesion involving the posterior right ninth rib with associated pathologic fracture. Median sternotomy wires. Review of the MIP images confirms the above findings. IMPRESSION: 1. Left upper lobe bland thrombus versus tumor thrombus. No CT evidence of right heart stranding. 2. Large area of consolidation in the left upper lobe in keeping with known malignancy with associated lymphangitic spread in the left upper lobe. 3. Right posterior ninth rib lytic lesion with associated pathologic fracture. 4. Cardiomegaly with evidence of right heart dysfunction. 5. A 1 cm right upper lobe subpleural nodule. 6. Aortic Atherosclerosis (ICD10-I70.0) and Emphysema (ICD10-J43.9). These results were called  by telephone at the time of interpretation on 04/11/2021 at 1:28 am to Dr. Sedonia Small, who verbally acknowledged these results. Electronically Signed   By: Anner Crete M.D.   On: 04/11/2021 01:31   NM PET Image Initial (PI) Skull Base To Thigh  Result Date: 03/23/2021 CLINICAL DATA:  Initial treatment strategy for left lung mass. EXAM: NUCLEAR MEDICINE PET SKULL BASE TO THIGH TECHNIQUE: 6.1 mCi F-18 FDG was injected intravenously. Full-ring PET imaging was performed from the skull base to thigh after the radiotracer. CT data was obtained and used for attenuation correction and anatomic localization. Fasting blood glucose: 25 mg/dl COMPARISON:  CT chest dated 03/07/2021 FINDINGS: Mediastinal blood pool activity: SUV max 2.5 Liver activity: SUV max NA NECK: No hypermetabolic cervical lymphadenopathy. Incidental CT findings: none CHEST: 7.6 cm left upper lobe mass (series 8/image 20), reflecting primary bronchogenic neoplasm, max SUV 16.4. Surrounding ground-glass opacity in the left upper lobe, suggesting lymphangitic carcinomatosis. Additional abnormal soft tissue in the left perihilar region extending into the hilum/mediastinum (series 4/image 66), max SUV 20.7 Associated superior mediastinal and AP window nodal metastases, measuring 13-15 mm short axis, max SUV 8.6. 8 mm ground-glass nodule in the posterior right upper lobe along the minor fissure (series 8/image 33), previously 6 mm in 2021, non FDG avid. Trace left pleural effusion, unchanged from recent CT, non FDG avid. Incidental CT findings: Atherosclerotic calcifications of the aortic arch. Three vessel coronary atherosclerosis. Postsurgical changes related to prior CABG. ABDOMEN/PELVIS: No abnormal hypermetabolism in the liver, spleen, pancreas, or adrenal glands. No hypermetabolic abdominopelvic lymphadenopathy. Incidental CT findings: 3.7 x 3.8 cm infrarenal abdominal aortic aneurysm. Atherosclerotic calcifications the abdominal aorta and branch  vessels. Sigmoid diverticulosis, without evidence of diverticulitis. SKELETON: Metastasis along the right posterior 9th rib, max SUV 6.2. Incidental CT findings: Mild degenerative changes of the lumbar spine. IMPRESSION: 7.6 cm left upper lobe mass, reflecting primary bronchogenic neoplasm. 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 Trace left pleural  effusion. Abnormal soft tissue in the left perihilar region extending into the mediastinum. Associated prevascular/AP window nodal metastases. 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 8 mm ground-glass nodule in the posterior right upper lobe, minimally progressive from 2021, non FDG avid. This is not considered suspicious for metastasis. Attention on follow-up is considered sufficient. 3.8 cm infrarenal abdominal aortic aneurysm. Recommend follow-up every 2 years. Reference: J Am Coll Radiol 0973;53:299-242. Electronically Signed   By: Julian Hy M.D.   On: 03/23/2021 22:33   DG CHEST PORT 1 VIEW  Result Date: 04/17/2021 CLINICAL DATA:  Acute respiratory failure with hypoxia. EXAM: PORTABLE CHEST 1 VIEW COMPARISON:  Chest x-ray dated April 15, 2021. FINDINGS: Progressive now complete opacification of the left hemithorax. Emphysematous right lung. No pneumothorax. Stable heart size. No significant mediastinal shift. IMPRESSION: 1. Progressive now complete opacification of the left hemithorax which could reflect large pleural effusion or progressive consolidation/collapse. Electronically Signed   By: Titus Dubin M.D.   On: 04/17/2021 15:57   DG Chest Port 1 View  Result Date: 04/15/2021 CLINICAL DATA:  Status post biopsy EXAM: PORTABLE CHEST 1 VIEW COMPARISON:  Radiograph 04/14/2021. FINDINGS: Progressive volume loss in the left hemithorax with subtotal opacification. Decreased aeration of the left perihilar and lower lung zone. No visualized pneumothorax. Skin fold projects over the right hemithorax. Patient is post median  sternotomy. Heart size and mediastinal contours are obscured. Emphysematous changes are again seen. IMPRESSION: 1. No visualized pneumothorax. 2. Progressive volume loss in the left hemithorax with subtotal opacification. This may be due to combination of progressive atelectasis/collapse and or airspace disease/hemorrhage. 3. Skin fold projects over the right chest. Electronically Signed   By: Keith Rake M.D.   On: 04/15/2021 18:01   DG Chest Port 1 View  Result Date: 04/10/2021 CLINICAL DATA:  Chest pain and hemoptysis. EXAM: PORTABLE CHEST 1 VIEW COMPARISON:  Chest x-ray 03/05/2021.  CT chest 03/07/2021. FINDINGS: Focal masslike opacity measuring 10 cm in the lateral left upper lobe has increased in size. There is increasing diffuse airspace disease and central interstitial opacities. Right lung is clear. There is no pleural effusion or pneumothorax. Again seen are postsurgical changes in the left chest. Sternotomy wires are present. Cardiomediastinal silhouette is stable, the heart is enlarged. No acute fractures are identified. IMPRESSION: 1. Enlarging left upper lobe mass. 2. New/increasing diffuse left lung airspace disease with interstitial prominence. Findings may related to infection, pneumonia and/or tumor progression. Electronically Signed   By: Ronney Asters M.D.   On: 04/10/2021 21:42   ECHOCARDIOGRAM COMPLETE  Result Date: 04/11/2021    ECHOCARDIOGRAM REPORT   Patient Name:   MCKYNNA VANLOAN Date of Exam: 04/11/2021 Medical Rec #:  [id number]     Height:       62.0 in Accession #:    [id number]    Weight:       125.0 lb Date of Birth:  [birth year]    BSA:          1.566 m Patient Age:    46 years      BP:           146/37 mmHg Patient Gender: F             HR:           74 bpm. Exam Location:  Inpatient Procedure: 2D Echo, Cardiac Doppler and Color Doppler Indications:    Pulmonary Embolus I26.09  History:        Patient has prior history of Echocardiogram  examinations, most                  recent 04/24/2020. CHF, CAD and Previous Myocardial Infarction,                 COPD and PAD, Arrythmias:Atrial Fibrillation,                 Signs/Symptoms:Murmur and Shortness of Breath; Risk                 Factors:Sleep Apnea, Hypertension and Dyslipidemia. Chronic                 hypoxic respiratory failure, hypothyroidism, recently identified                 left lung mass, AAA, bradycardia.  Sonographer:    Darlina Sicilian RDCS Referring Phys: [id number] TIMOTHY S OPYD  Sonographer Comments: Image acquisition challenging due to respiratory motion and Image acquisition challenging due to COPD. IMPRESSIONS  1. Left ventricular ejection fraction, by estimation, is 50 to 55%. The left ventricle has low normal function. The left ventricle demonstrates regional wall motion abnormalities (see scoring diagram/findings for description). Left ventricular diastolic  parameters are indeterminate. There is moderate hypokinesis of the left ventricular, basal-mid inferior wall and inferoseptal wall.  2. Right ventricular systolic function was not well visualized. The right ventricular size is normal. There is mildly elevated pulmonary artery systolic pressure. The estimated right ventricular systolic pressure is 37.8 mmHg.  3. Left atrial size was moderately dilated.  4. Right atrial size was severely dilated.  5. The mitral valve is degenerative. Mild to moderate mitral valve regurgitation. No evidence of mitral stenosis. Moderate mitral annular calcification.  6. Tricuspid valve regurgitation is moderate to severe.  7. The aortic valve is calcified. There is moderate calcification of the aortic valve. There is moderate thickening of the aortic valve. Aortic valve regurgitation is not visualized. Aortic valve sclerosis/calcification is present, without any evidence of aortic stenosis.  8. Mildly dilated pulmonary artery.  9. The inferior vena cava is normal in size with greater than 50% respiratory variability, suggesting  right atrial pressure of 3 mmHg. Comparison(s): Prior images reviewed side by side. Conclusion(s)/Recommendation(s): Difficult images on current and prior study. Overall appears largely similar, but on current images there is concern for basal hypokinesis of the inferior and inferoseptal wall. Would recommend future studies use echo contrast to evaluate more completely. FINDINGS  Left Ventricle: Left ventricular ejection fraction, by estimation, is 50 to 55%. The left ventricle has low normal function. The left ventricle demonstrates regional wall motion abnormalities. Moderate hypokinesis of the left ventricular, basal-mid inferior wall and inferoseptal wall. 3D left ventricular ejection fraction analysis performed but not reported based on interpreter judgement due to suboptimal tracking. The left ventricular internal cavity size was normal in size. There is no left ventricular hypertrophy. Left ventricular diastolic parameters are indeterminate. Right Ventricle: The right ventricular size is normal. Right vetricular wall thickness was not well visualized. Right ventricular systolic function was not well visualized. There is mildly elevated pulmonary artery systolic pressure. The tricuspid regurgitant velocity is 2.98 m/s, and with an assumed right atrial pressure of 3 mmHg, the estimated right ventricular systolic pressure is 58.8 mmHg. Left Atrium: Left atrial size was moderately dilated. Right Atrium: Right atrial size was severely dilated. Pericardium: There is no evidence of pericardial effusion. Mitral Valve: The mitral valve is degenerative in appearance. There is mild thickening of the mitral valve leaflet(s). There is mild  calcification of the mitral valve leaflet(s). Moderate mitral annular calcification. Mild to moderate mitral valve regurgitation. No evidence of mitral valve stenosis. Tricuspid Valve: The tricuspid valve is normal in structure. Tricuspid valve regurgitation is moderate to severe. No  evidence of tricuspid stenosis. Aortic Valve: The aortic valve is calcified. There is moderate calcification of the aortic valve. There is moderate thickening of the aortic valve. Aortic valve regurgitation is not visualized. Aortic valve sclerosis/calcification is present, without any  evidence of aortic stenosis. Pulmonic Valve: The pulmonic valve was not well visualized. Pulmonic valve regurgitation is mild. No evidence of pulmonic stenosis. Aorta: The aortic root and ascending aorta are structurally normal, with no evidence of dilitation and the aortic arch was not well visualized. Pulmonary Artery: The pulmonary artery is mildly dilated. Venous: The inferior vena cava is normal in size with greater than 50% respiratory variability, suggesting right atrial pressure of 3 mmHg. IAS/Shunts: The atrial septum is grossly normal.  LEFT VENTRICLE PLAX 2D LVIDd:         5.10 cm   Diastology LVIDs:         4.30 cm   LV e' medial:    8.81 cm/s LV PW:         1.00 cm   LV E/e' medial:  10.5 LV IVS:        1.00 cm   LV e' lateral:   9.03 cm/s LVOT diam:     1.90 cm   LV E/e' lateral: 10.3 LV SV:         43 LV SV Index:   27 LVOT Area:     2.84 cm                           3D Volume EF:                          3D EF:        42 %                          LV EDV:       146 ml                          LV ESV:       85 ml                          LV SV:        61 ml RIGHT VENTRICLE RV S prime:     8.17 cm/s LEFT ATRIUM             Index        RIGHT ATRIUM           Index LA diam:        5.30 cm 3.39 cm/m   RA Area:     25.80 cm LA Vol (A2C):   47.8 ml 30.53 ml/m  RA Volume:   83.60 ml  53.40 ml/m LA Vol (A4C):   50.8 ml 32.45 ml/m LA Biplane Vol: 49.4 ml 31.55 ml/m  AORTIC VALVE LVOT Vmax:   89.90 cm/s LVOT Vmean:  59.200 cm/s LVOT VTI:    0.151 m  AORTA Ao Root diam: 2.80 cm Ao Asc diam:  3.10 cm MITRAL VALVE  TRICUSPID VALVE MV Area (PHT): 3.72 cm       TR Peak grad:   35.5 mmHg MV Decel Time: 204  msec       TR Vmax:        298.00 cm/s MR Peak grad:    168.5 mmHg MR Mean grad:    104.0 mmHg   SHUNTS MR Vmax:         649.00 cm/s  Systemic VTI:  0.15 m MR Vmean:        483.0 cm/s   Systemic Diam: 1.90 cm MR PISA:         1.01 cm MR PISA Eff ROA: 13 mm MR PISA Radius:  0.40 cm MV E velocity: 92.70 cm/s MV A velocity: 102.00 cm/s MV E/A ratio:  0.91 Buford Dresser MD Electronically signed by Buford Dresser MD Signature Date/Time: 04/11/2021/1:23:04 PM    Final    VAS [place] LOWER EXTREMITY VENOUS (DVT)  Result Date: 04/11/2021  Lower Venous DVT Study Patient Name:  KAROLYNE TIMMONS  Date of Exam:   04/11/2021 Medical Rec #: [id number]      Accession #:    [id number] Date of Birth: 08/01/1940     Patient Gender: F Patient Age:   55 years Exam Location:  [facility] Procedure:      VAS [place] LOWER EXTREMITY VENOUS (DVT) Referring Phys: TIMOTHY OPYD --------------------------------------------------------------------------------  Indications: Pulmonary embolism.  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 Comparison Study: No prior study Performing Technologist: Sharion Dove RVS  Examination Guidelines: A complete evaluation includes B-mode imaging, spectral Doppler, color Doppler, and power Doppler as needed of all accessible portions of each vessel. Bilateral testing is considered an integral part of a complete examination. Limited examinations for reoccurring indications may be performed as noted. The reflux portion of the exam is performed with the patient in reverse Trendelenburg.  +---------+---------------+---------+-----------+----------+-------------------+ RIGHT    CompressibilityPhasicitySpontaneityPropertiesThrombus Aging      +---------+---------------+---------+-----------+----------+-------------------+ CFV      Full                                         pulsatile waveforms +---------+---------------+---------+-----------+----------+-------------------+ SFJ       Full                                                             +---------+---------------+---------+-----------+----------+-------------------+ FV Prox  Full                                                             +---------+---------------+---------+-----------+----------+-------------------+ FV Mid   Full                                                             +---------+---------------+---------+-----------+----------+-------------------+ FV DistalFull                                                             +---------+---------------+---------+-----------+----------+-------------------+  PFV      Full                                                             +---------+---------------+---------+-----------+----------+-------------------+ POP      Full           No       No                                       +---------+---------------+---------+-----------+----------+-------------------+ PTV      Full                                                             +---------+---------------+---------+-----------+----------+-------------------+ PERO     Full                                                             +---------+---------------+---------+-----------+----------+-------------------+   +---------+---------------+---------+-----------+----------+-------------------+ LEFT     CompressibilityPhasicitySpontaneityPropertiesThrombus Aging      +---------+---------------+---------+-----------+----------+-------------------+ CFV      Full           No       Yes                  pulsatile flow      +---------+---------------+---------+-----------+----------+-------------------+ SFJ      Full                                                             +---------+---------------+---------+-----------+----------+-------------------+ FV Prox  Full                                                              +---------+---------------+---------+-----------+----------+-------------------+ FV Mid   Full                                                             +---------+---------------+---------+-----------+----------+-------------------+ FV DistalFull                                                             +---------+---------------+---------+-----------+----------+-------------------+ PFV  Full                                                             +---------+---------------+---------+-----------+----------+-------------------+ POP      Full                                         Dilated with                                                              rouleaux flow       +---------+---------------+---------+-----------+----------+-------------------+ PTV      Full                                                             +---------+---------------+---------+-----------+----------+-------------------+ PERO     Full                                                             +---------+---------------+---------+-----------+----------+-------------------+ Gastroc  Full                                         Dilated with                                                              rouleaux flow       +---------+---------------+---------+-----------+----------+-------------------+     Summary: RIGHT: - There is no evidence of deep vein thrombosis in the lower extremity.  LEFT: - There is no evidence of deep vein thrombosis in the lower extremity.  - The popliteal and gastrocnemius veins are easily compressible but are dilated with rouleaux flow  *See table(s) above for measurements and observations. Electronically signed by Orlie Pollen on 04/11/2021 at 7:05:27 PM.    Final    CT LUNG MASS BIOPSY  Result Date: 04/15/2021 INDICATION: 80 year old woman with left upper lobe lung mass presents to IR for CT-guided biopsy EXAM: CT-guided  biopsy of left lung mass MEDICATIONS: None. ANESTHESIA/SEDATION: Moderate (conscious) sedation was employed during this procedure. A total of Versed 1 mg and Fentanyl 25 mcg was administered intravenously. Moderate Sedation Time: 10 minutes. The patient's level of consciousness and vital signs were monitored continuously by radiology nursing throughout the procedure under my direct supervision. COMPLICATIONS: SIR Level A - No therapy, no consequence. Moderate hemoptysis PROCEDURE: Informed  written consent was obtained from the patient after a thorough discussion of the procedural risks, benefits and alternatives. All questions were addressed. Maximal Sterile Barrier Technique was utilized including caps, mask, sterile gowns, sterile gloves, sterile drape, hand hygiene and skin antiseptic. A timeout was performed prior to the initiation of the procedure. Patient positioned supine on the CT table. The left anterior chest wall skin prepped and draped in usual fashion. Utilizing CT guidance, 17 gauge introducer needle was advanced into the left upper lobe lung mass and 3-18 gauge cores were obtained. All samples were sent to pathology in formalin. The patient developed moderate hemoptysis after the biopsy requiring supplemental oxygen. Patient's hemoptysis stopped after she was positioned left lateral decubitus. IMPRESSION: CT-guided biopsy of left upper lobe lung mass. Electronically Signed   By: Miachel Roux M.D.   On: 04/15/2021 15:48     DG Chest 1 View  Result Date: 04/15/2021 CLINICAL DATA:  Dyspnea EXAM: CHEST  1 VIEW COMPARISON:  04/15/2021 FINDINGS: Single frontal view of the chest demonstrates persistent left upper lobe mass with suspected lymphangitic spread of disease throughout the left upper lobe. Emphysematous changes are seen throughout the right lung. No effusion or pneumothorax. Erosive change left posterolateral fifth rib consistent with direct invasion by the adjacent left upper lobe mass as  per recent CT. IMPRESSION: 1. Stable left upper lobe mass and lymphangitic spread. 2. Stable left fifth rib erosive change consistent with direct invasion of tumor. The right posterior ninth rib lytic lesion seen on CT is not readily apparent by x-ray. Electronically Signed   By: Randa Ngo M.D.   On: 04/15/2021 19:48   DG Chest 1 View  Result Date: 04/14/2021 CLINICAL DATA:  Shortness of breath. EXAM: CHEST  1 VIEW COMPARISON:  CTA Chest 04/11/2021.  Chest x-ray 04/10/2021 FINDINGS: Interval progression of airspace consolidation in the left upper lobe, now with probable involvement in the left lower lobe and potential layering left pleural effusion. Right lung is clear. Heart size stable. Bones are diffusely demineralized. Telemetry leads overlie the chest. IMPRESSION: Interval progression of left upper and lower lobe airspace disease with associated left upper lobe mass, better demonstrated on recent chest CT. Features may reflect progressive infection or worsening postobstructive etiology. Electronically Signed   By: Misty Stanley M.D.   On: 04/14/2021 07:40   CT Angio Chest PE W and/or Wo Contrast  Result Date: 04/11/2021 CLINICAL DATA:  Concern for pulmonary embolism.  Left lung mass. EXAM: CT ANGIOGRAPHY CHEST WITH CONTRAST TECHNIQUE: Multidetector CT imaging of the chest was performed using the standard protocol during bolus administration of intravenous contrast. Multiplanar CT image reconstructions and MIPs were obtained to evaluate the vascular anatomy. CONTRAST:  68m of Omni 350 COMPARISON:  Chest radiograph dated 04/10/2021 and head CT dated 03/21/2021 FINDINGS: Cardiovascular: There is cardiomegaly. No pericardial effusion. There is coronary vascular calcification and postsurgical changes of CABG. Retrograde flow of contrast from the right atrium into the IVC suggestive of right heart dysfunction. Moderate atherosclerotic calcification of the thoracic aorta. There is linear nearly  occlusive thrombus in the left upper lobe pulmonary artery branch. This may represent a bland thrombus versus tumor thrombus. No CT evidence of right heart stranding. Mediastinum/Nodes: Evaluation of the left hilum is limited due to consolidative changes of the left upper lobe. No mediastinal fluid collection. Lungs/Pleura: Large area of consolidation in the left upper lobe as seen on the PET-CT in keeping with known malignancy with associated lymphangitic spread in the left upper  lobe. There is background of emphysema. A 1 cm right upper lobe subpleural nodule. No significant pleural effusion. No pneumothorax. The central airways are patent. Upper Abdomen: Small hiatal hernia. Musculoskeletal: A 17 mm lytic lesion involving the posterior right ninth rib with associated pathologic fracture. Median sternotomy wires. Review of the MIP images confirms the above findings. IMPRESSION: 1. Left upper lobe bland thrombus versus tumor thrombus. No CT evidence of right heart stranding. 2. Large area of consolidation in the left upper lobe in keeping with known malignancy with associated lymphangitic spread in the left upper lobe. 3. Right posterior ninth rib lytic lesion with associated pathologic fracture. 4. Cardiomegaly with evidence of right heart dysfunction. 5. A 1 cm right upper lobe subpleural nodule. 6. Aortic Atherosclerosis (ICD10-I70.0) and Emphysema (ICD10-J43.9). These results were called by telephone at the time of interpretation on 04/11/2021 at 1:28 am to Dr. Sedonia Small, who verbally acknowledged these results. Electronically Signed   By: Anner Crete M.D.   On: 04/11/2021 01:31   NM PET Image Initial (PI) Skull Base To Thigh  Result Date: 03/23/2021 CLINICAL DATA:  Initial treatment strategy for left lung mass. EXAM: NUCLEAR MEDICINE PET SKULL BASE TO THIGH TECHNIQUE: 6.1 mCi F-18 FDG was injected intravenously. Full-ring PET imaging was performed from the skull base to thigh after the radiotracer. CT  data was obtained and used for attenuation correction and anatomic localization. Fasting blood glucose: 25 mg/dl COMPARISON:  CT chest dated 03/07/2021 FINDINGS: Mediastinal blood pool activity: SUV max 2.5 Liver activity: SUV max NA NECK: No hypermetabolic cervical lymphadenopathy. Incidental CT findings: none CHEST: 7.6 cm left upper lobe mass (series 8/image 20), reflecting primary bronchogenic neoplasm, max SUV 16.4. Surrounding ground-glass opacity in the left upper lobe, suggesting lymphangitic carcinomatosis. Additional abnormal soft tissue in the left perihilar region extending into the hilum/mediastinum (series 4/image 66), max SUV 20.7 Associated superior mediastinal and AP window nodal metastases, measuring 13-15 mm short axis, max SUV 8.6. 8 mm ground-glass nodule in the posterior right upper lobe along the minor fissure (series 8/image 33), previously 6 mm in 2021, non FDG avid. Trace left pleural effusion, unchanged from recent CT, non FDG avid. Incidental CT findings: Atherosclerotic calcifications of the aortic arch. Three vessel coronary atherosclerosis. Postsurgical changes related to prior CABG. ABDOMEN/PELVIS: No abnormal hypermetabolism in the liver, spleen, pancreas, or adrenal glands. No hypermetabolic abdominopelvic lymphadenopathy. Incidental CT findings: 3.7 x 3.8 cm infrarenal abdominal aortic aneurysm. Atherosclerotic calcifications the abdominal aorta and branch vessels. Sigmoid diverticulosis, without evidence of diverticulitis. SKELETON: Metastasis along the right posterior 9th rib, max SUV 6.2. Incidental CT findings: Mild degenerative changes of the lumbar spine. IMPRESSION: 7.6 cm left upper lobe mass, reflecting primary bronchogenic neoplasm. 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 Trace left pleural effusion. Abnormal soft tissue in the left perihilar region extending into the mediastinum. Associated prevascular/AP window nodal metastases. 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 8 mm ground-glass nodule in the posterior right upper lobe, minimally progressive from 2021, non FDG avid. This is not considered suspicious for metastasis. Attention on follow-up is considered sufficient. 3.8 cm infrarenal abdominal aortic aneurysm. Recommend follow-up every 2 years. Reference: J Am Coll Radiol 4650;35:465-681. Electronically Signed   By: Julian Hy M.D.   On: 03/23/2021 22:33   DG CHEST PORT 1 VIEW  Result Date: 04/17/2021 CLINICAL DATA:  Acute respiratory failure with hypoxia. EXAM: PORTABLE CHEST 1 VIEW COMPARISON:  Chest x-ray dated April 15, 2021. FINDINGS: Progressive now complete opacification of the left  hemithorax. Emphysematous right lung. No pneumothorax. Stable heart size. No significant mediastinal shift. IMPRESSION: 1. Progressive now complete opacification of the left hemithorax which could reflect large pleural effusion or progressive consolidation/collapse. Electronically Signed   By: Titus Dubin M.D.   On: 04/17/2021 15:57   DG Chest Port 1 View  Result Date: 04/15/2021 CLINICAL DATA:  Status post biopsy EXAM: PORTABLE CHEST 1 VIEW COMPARISON:  Radiograph 04/14/2021. FINDINGS: Progressive volume loss in the left hemithorax with subtotal opacification. Decreased aeration of the left perihilar and lower lung zone. No visualized pneumothorax. Skin fold projects over the right hemithorax. Patient is post median sternotomy. Heart size and mediastinal contours are obscured. Emphysematous changes are again seen. IMPRESSION: 1. No visualized pneumothorax. 2. Progressive volume loss in the left hemithorax with subtotal opacification. This may be due to combination of progressive atelectasis/collapse and or airspace disease/hemorrhage. 3. Skin fold projects over the right chest. Electronically Signed   By: Keith Rake M.D.   On: 04/15/2021 18:01   DG Chest Port 1 View  Result Date: 04/10/2021 CLINICAL DATA:  Chest pain and hemoptysis.  EXAM: PORTABLE CHEST 1 VIEW COMPARISON:  Chest x-ray 03/05/2021.  CT chest 03/07/2021. FINDINGS: Focal masslike opacity measuring 10 cm in the lateral left upper lobe has increased in size. There is increasing diffuse airspace disease and central interstitial opacities. Right lung is clear. There is no pleural effusion or pneumothorax. Again seen are postsurgical changes in the left chest. Sternotomy wires are present. Cardiomediastinal silhouette is stable, the heart is enlarged. No acute fractures are identified. IMPRESSION: 1. Enlarging left upper lobe mass. 2. New/increasing diffuse left lung airspace disease with interstitial prominence. Findings may related to infection, pneumonia and/or tumor progression. Electronically Signed   By: Ronney Asters M.D.   On: 04/10/2021 21:42   ECHOCARDIOGRAM COMPLETE  Result Date: 04/11/2021    ECHOCARDIOGRAM REPORT   Patient Name:   Andrea [name] Date of Exam: 04/11/2021 Medical Rec #:  [id number]     Height:       62.0 in Accession #:    [id number]    Weight:       125.0 lb Date of Birth:  Apr 05, 1941    BSA:          1.566 m Patient Age:    1 years      BP:           146/37 mmHg Patient Gender: F             HR:           74 bpm. Exam Location:  Inpatient Procedure: 2D Echo, Cardiac Doppler and Color Doppler Indications:    Pulmonary Embolus I26.09  History:        Patient has prior history of Echocardiogram examinations, most                 recent 04/24/2020. CHF, CAD and Previous Myocardial Infarction,                 COPD and PAD, Arrythmias:Atrial Fibrillation,                 Signs/Symptoms:Murmur and Shortness of Breath; Risk                 Factors:Sleep Apnea, Hypertension and Dyslipidemia. Chronic                 hypoxic respiratory failure, hypothyroidism, recently identified  left lung mass, AAA, bradycardia.  Sonographer:    Darlina Sicilian RDCS Referring Phys: [id number] TIMOTHY S OPYD  Sonographer Comments: Image acquisition challenging due to  respiratory motion and Image acquisition challenging due to COPD. IMPRESSIONS  1. Left ventricular ejection fraction, by estimation, is 50 to 55%. The left ventricle has low normal function. The left ventricle demonstrates regional wall motion abnormalities (see scoring diagram/findings for description). Left ventricular diastolic  parameters are indeterminate. There is moderate hypokinesis of the left ventricular, basal-mid inferior wall and inferoseptal wall.  2. Right ventricular systolic function was not well visualized. The right ventricular size is normal. There is mildly elevated pulmonary artery systolic pressure. The estimated right ventricular systolic pressure is 99.3 mmHg.  3. Left atrial size was moderately dilated.  4. Right atrial size was severely dilated.  5. The mitral valve is degenerative. Mild to moderate mitral valve regurgitation. No evidence of mitral stenosis. Moderate mitral annular calcification.  6. Tricuspid valve regurgitation is moderate to severe.  7. The aortic valve is calcified. There is moderate calcification of the aortic valve. There is moderate thickening of the aortic valve. Aortic valve regurgitation is not visualized. Aortic valve sclerosis/calcification is present, without any evidence of aortic stenosis.  8. Mildly dilated pulmonary artery.  9. The inferior vena cava is normal in size with greater than 50% respiratory variability, suggesting right atrial pressure of 3 mmHg. Comparison(s): Prior images reviewed side by side. Conclusion(s)/Recommendation(s): Difficult images on current and prior study. Overall appears largely similar, but on current images there is concern for basal hypokinesis of the inferior and inferoseptal wall. Would recommend future studies use echo contrast to evaluate more completely. FINDINGS  Left Ventricle: Left ventricular ejection fraction, by estimation, is 50 to 55%. The left ventricle has low normal function. The left ventricle demonstrates  regional wall motion abnormalities. Moderate hypokinesis of the left ventricular, basal-mid inferior wall and inferoseptal wall. 3D left ventricular ejection fraction analysis performed but not reported based on interpreter judgement due to suboptimal tracking. The left ventricular internal cavity size was normal in size. There is no left ventricular hypertrophy. Left ventricular diastolic parameters are indeterminate. Right Ventricle: The right ventricular size is normal. Right vetricular wall thickness was not well visualized. Right ventricular systolic function was not well visualized. There is mildly elevated pulmonary artery systolic pressure. The tricuspid regurgitant velocity is 2.98 m/s, and with an assumed right atrial pressure of 3 mmHg, the estimated right ventricular systolic pressure is 71.6 mmHg. Left Atrium: Left atrial size was moderately dilated. Right Atrium: Right atrial size was severely dilated. Pericardium: There is no evidence of pericardial effusion. Mitral Valve: The mitral valve is degenerative in appearance. There is mild thickening of the mitral valve leaflet(s). There is mild calcification of the mitral valve leaflet(s). Moderate mitral annular calcification. Mild to moderate mitral valve regurgitation. No evidence of mitral valve stenosis. Tricuspid Valve: The tricuspid valve is normal in structure. Tricuspid valve regurgitation is moderate to severe. No evidence of tricuspid stenosis. Aortic Valve: The aortic valve is calcified. There is moderate calcification of the aortic valve. There is moderate thickening of the aortic valve. Aortic valve regurgitation is not visualized. Aortic valve sclerosis/calcification is present, without any  evidence of aortic stenosis. Pulmonic Valve: The pulmonic valve was not well visualized. Pulmonic valve regurgitation is mild. No evidence of pulmonic stenosis. Aorta: The aortic root and ascending aorta are structurally normal, with no evidence of  dilitation and the aortic arch was not well visualized. Pulmonary Artery:  The pulmonary artery is mildly dilated. Venous: The inferior vena cava is normal in size with greater than 50% respiratory variability, suggesting right atrial pressure of 3 mmHg. IAS/Shunts: The atrial septum is grossly normal.  LEFT VENTRICLE PLAX 2D LVIDd:         5.10 cm   Diastology LVIDs:         4.30 cm   LV e' medial:    8.81 cm/s LV PW:         1.00 cm   LV E/e' medial:  10.5 LV IVS:        1.00 cm   LV e' lateral:   9.03 cm/s LVOT diam:     1.90 cm   LV E/e' lateral: 10.3 LV SV:         43 LV SV Index:   27 LVOT Area:     2.84 cm                           3D Volume EF:                          3D EF:        42 %                          LV EDV:       146 ml                          LV ESV:       85 ml                          LV SV:        61 ml RIGHT VENTRICLE RV S prime:     8.17 cm/s LEFT ATRIUM             Index        RIGHT ATRIUM           Index LA diam:        5.30 cm 3.39 cm/m   RA Area:     25.80 cm LA Vol (A2C):   47.8 ml 30.53 ml/m  RA Volume:   83.60 ml  53.40 ml/m LA Vol (A4C):   50.8 ml 32.45 ml/m LA Biplane Vol: 49.4 ml 31.55 ml/m  AORTIC VALVE LVOT Vmax:   89.90 cm/s LVOT Vmean:  59.200 cm/s LVOT VTI:    0.151 m  AORTA Ao Root diam: 2.80 cm Ao Asc diam:  3.10 cm MITRAL VALVE                  TRICUSPID VALVE MV Area (PHT): 3.72 cm       TR Peak grad:   35.5 mmHg MV Decel Time: 204 msec       TR Vmax:        298.00 cm/s MR Peak grad:    168.5 mmHg MR Mean grad:    104.0 mmHg   SHUNTS MR Vmax:         649.00 cm/s  Systemic VTI:  0.15 m MR Vmean:        483.0 cm/s   Systemic Diam: 1.90 cm MR PISA:         1.01 cm MR PISA Eff ROA: 13 mm MR PISA Radius:  0.40  cm MV E velocity: 92.70 cm/s MV A velocity: 102.00 cm/s MV E/A ratio:  0.91 Buford Dresser MD Electronically signed by Buford Dresser MD Signature Date/Time: 04/11/2021/1:23:04 PM    Final    VAS [place] LOWER EXTREMITY VENOUS (DVT)  Result Date:  04/11/2021  Lower Venous DVT Study Patient Name:  Andrea [name]  Date of Exam:   04/11/2021 Medical Rec #: [id number]      Accession #:    [id number] Date of Birth: 1940/06/30     Patient Gender: F Patient Age:   49 years Exam Location:  [facility] Procedure:      VAS [place] LOWER EXTREMITY VENOUS (DVT) Referring Phys: TIMOTHY OPYD --------------------------------------------------------------------------------  Indications: Pulmonary embolism.  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 Comparison Study: No prior study Performing Technologist: Sharion Dove RVS  Examination Guidelines: A complete evaluation includes B-mode imaging, spectral Doppler, color Doppler, and power Doppler as needed of all accessible portions of each vessel. Bilateral testing is considered an integral part of a complete examination. Limited examinations for reoccurring indications may be performed as noted. The reflux portion of the exam is performed with the patient in reverse Trendelenburg.  +---------+---------------+---------+-----------+----------+-------------------+ RIGHT    CompressibilityPhasicitySpontaneityPropertiesThrombus Aging      +---------+---------------+---------+-----------+----------+-------------------+ CFV      Full                                         pulsatile waveforms +---------+---------------+---------+-----------+----------+-------------------+ SFJ      Full                                                             +---------+---------------+---------+-----------+----------+-------------------+ FV Prox  Full                                                             +---------+---------------+---------+-----------+----------+-------------------+ FV Mid   Full                                                             +---------+---------------+---------+-----------+----------+-------------------+ FV DistalFull                                                              +---------+---------------+---------+-----------+----------+-------------------+ PFV      Full                                                             +---------+---------------+---------+-----------+----------+-------------------+ POP  Full           No       No                                       +---------+---------------+---------+-----------+----------+-------------------+ PTV      Full                                                             +---------+---------------+---------+-----------+----------+-------------------+ PERO     Full                                                             +---------+---------------+---------+-----------+----------+-------------------+   +---------+---------------+---------+-----------+----------+-------------------+ LEFT     CompressibilityPhasicitySpontaneityPropertiesThrombus Aging      +---------+---------------+---------+-----------+----------+-------------------+ CFV      Full           No       Yes                  pulsatile flow      +---------+---------------+---------+-----------+----------+-------------------+ SFJ      Full                                                             +---------+---------------+---------+-----------+----------+-------------------+ FV Prox  Full                                                             +---------+---------------+---------+-----------+----------+-------------------+ FV Mid   Full                                                             +---------+---------------+---------+-----------+----------+-------------------+ FV DistalFull                                                             +---------+---------------+---------+-----------+----------+-------------------+ PFV      Full                                                              +---------+---------------+---------+-----------+----------+-------------------+ POP      Full  Dilated with                                                              rouleaux flow       +---------+---------------+---------+-----------+----------+-------------------+ PTV      Full                                                             +---------+---------------+---------+-----------+----------+-------------------+ PERO     Full                                                             +---------+---------------+---------+-----------+----------+-------------------+ Gastroc  Full                                         Dilated with                                                              rouleaux flow       +---------+---------------+---------+-----------+----------+-------------------+     Summary: RIGHT: - There is no evidence of deep vein thrombosis in the lower extremity.  LEFT: - There is no evidence of deep vein thrombosis in the lower extremity.  - The popliteal and gastrocnemius veins are easily compressible but are dilated with rouleaux flow  *See table(s) above for measurements and observations. Electronically signed by Orlie Pollen on 04/11/2021 at 7:05:27 PM.    Final    CT LUNG MASS BIOPSY  Result Date: 04/15/2021 INDICATION: 80 year old woman with left upper lobe lung mass presents to IR for CT-guided biopsy EXAM: CT-guided biopsy of left lung mass MEDICATIONS: None. ANESTHESIA/SEDATION: Moderate (conscious) sedation was employed during this procedure. A total of Versed 1 mg and Fentanyl 25 mcg was administered intravenously. Moderate Sedation Time: 10 minutes. The patient's level of consciousness and vital signs were monitored continuously by radiology nursing throughout the procedure under my direct supervision. COMPLICATIONS: SIR Level A - No therapy, no consequence. Moderate hemoptysis PROCEDURE:  Informed written consent was obtained from the patient after a thorough discussion of the procedural risks, benefits and alternatives. All questions were addressed. Maximal Sterile Barrier Technique was utilized including caps, mask, sterile gowns, sterile gloves, sterile drape, hand hygiene and skin antiseptic. A timeout was performed prior to the initiation of the procedure. Patient positioned supine on the CT table. The left anterior chest wall skin prepped and draped in usual fashion. Utilizing CT guidance, 17 gauge introducer needle was advanced into the left upper lobe lung mass and 3-18 gauge cores were obtained. All samples were sent to pathology in formalin. The patient developed moderate hemoptysis after the  biopsy requiring supplemental oxygen. Patient's hemoptysis stopped after she was positioned left lateral decubitus. IMPRESSION: CT-guided biopsy of left upper lobe lung mass. Electronically Signed   By: Miachel Roux M.D.   On: 04/15/2021 15:48    Pathology:  SURGICAL PATHOLOGY  CASE: [id number]  PATIENT: Andrea [name]  Surgical Pathology Report   Clinical History: left lung mass (cm)   FINAL MICROSCOPIC DIAGNOSIS:   A. LUNG MASS, LEFT, NEEDLE CORE BIOPSY:  - Adenocarcinoma, see comment   COMMENT:   Dr. Saralyn Pilar reviewed the case and concurs with the diagnosis. Dr. Cathlean Sauer  was paged on 04/17/2021.   Assessment and Plan:  Ms. Umble is an 80 year old female with multiple medical problems and new diagnosis of lung adenocarcinoma.  She presented to the hospital with right rib pain secondary to pathologic fracture and was found to have left upper lobe PE which could be bland thrombus versus tumor thrombus.  Currently receiving IV heparin.  She continues to have ongoing dyspnea and hypoxia and is requiring HFNC.  Currently on 20 L of oxygen.  ##Non-small cell lung cancer, adenocarcinoma --Discussed imaging and biopsy results with the patient. --We discussed that she has metastatic  disease and her lung cancer is not curable. --We further discussed that given her high oxygen demands and performance status estimated to be about 3-4, she is not a candidate for systemic treatment. --We discussed considering a comfort based approach with referral to hospice.  She wants to take some time to think about this and would like to see how she does over the next day or 2. --Recommend continued discussion with palliative care regarding goals of care. --Continue supportive care.  --Agree with DNR/DNI.  ##Left upper lobe PE (bland thrombus versus tumor thrombus) --Continue IV heparin for now  ##Pleural effusion --Status post thoracentesis 04/19/2021 with minimal relief of symptoms. -- Cytology pending. --Pulmonology considering placement of Pleurx catheter when fluid reaccumulates.  ##Microcytic anemia --May be related to iron deficiency or anemia of chronic disease. --Would recommend further work-up with ferritin and iron studies if consistent with goals of care. --Hemoglobin stable at this time.  Thank you for this referral.   Mikey Bussing, DNP, AGPCNP-BC, AOCNP

## 2021-04-19 NOTE — Progress Notes (Signed)
Palliative:  Chart reviewed. Remains about the same. Plans for thoracentesis today.  Have seen patient previous two days - goals have remained clear that she is not interested in any sort of resuscitative interventions such as CPR, intubation, trach - but would like to continue with current measures, attempt to wean oxygen. Discussed with Dr. Algis Liming via epic chat - no need to see today, allow time for outcomes.   Will ask other members of palliative team to follow along over weekend.   Juel Burrow, DNP, AGNP-C Palliative Medicine Team Team Phone # [phone]  Pager # [phone]  NO CHARGE

## 2021-04-19 NOTE — Progress Notes (Addendum)
PROGRESS NOTE   Andrea [name]  PYP:[id number]    DOB: 10-Aug-1940    DOA: 04/10/2021  PCP: Mosie Lukes, MD   I have briefly reviewed patients previous medical records in [facility].  Chief complaint: Dyspnea   Brief Narrative:  80 year old female, medical history significant for COPD, OSA on CPAP, CAD s/p CABG, hypertension, PAD, former smoker, history of PE in the past after her CABG, A. fib, chronic diastolic CHF, hypothyroid, recently saw pulmonology/Dr. Lamonte Sakai on 04/02/2021 for a left upper lobe mass and lytic lesion on the posterior right ninth rib identified on CT chest 03/07/2021, significantly hypermetabolic on PET 24/58/0998, planned outpatient biopsy on 04/15/2021, now admitted 04/10/2021 with complaints of acute onset of severe right-sided rib cage pain, worse with coughing and dyspnea.  CT chest showed left upper lobe with bland thrombus versus tumor thrombus, large area of consolidation in the left upper lobe with lymphangitic spread, right posterior ninth rib lesion with pathological fracture.  Admitted for large left upper lobe mass, biopsy now confirms adenocarcinoma, suspecting primary lung cancer with acute pulmonary embolism/possible tumor thrombus, pathological ninth rib fracture, postobstructive pneumonia, possible COPD exacerbation and acute on chronic hypoxic respiratory failure. Hospital course complicated by progressive acute respiratory failure with hypoxia requiring HFNC.  PCCM consulted and plan left thoracentesis 11/25.  Palliative care consulted for goals of care.   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  Pathology confirms adenocarcinoma and I updated patient with these results.  Consulted medical oncology. - PCCM consultation 11/23 appreciated.  Concerned that her overall prognosis, appropriateness to tolerate treatment is poor and may be heading in the direction of palliative care. - Supportive treatment of dyspnea/hypoxia with oxygen supplements, pulmonary toilet and pain with multimodality pain control.  If hemoptysis were to worsen then may have to stop anticoagulation. - Palliative care follow-up appreciated.  Is DNR/DNI, wants to explore treatment options. - Repeat chest x-ray 11/23 evening showed complete whiteout of left lung fields likely multifactorial-large lung mass, pleural effusion, consolidation or collapse.   - PCCM plans on left thoracentesis 11/25.  Left upper lobe PE (bland thrombus versus tumor thrombus): - Continue IV heparin anticoagulation for now. - 2D echo showed LVEF of 50-55%, moderate hypokinesis of left ventricle based mid inferior wall and inferior septal wall.  Suspected left upper lobe postobstructive pneumonia: - Continue empiric IV antibiotics.  Day 6 of IV ceftriaxone today.  Complete total 7 days course.  1 cm right upper lobe subpleural nodule: - Outpatient follow-up.  May be part of the above malignant process.  COPD exacerbation: - Emphysema noted on CTA chest - Continue IV Solu-Medrol, IV antibiotics, inhaled steroids.  Acute respiratory failure with hypoxia - Due to large left upper lobe lung mass, suspected primary bronchogenic carcinoma with mets, postobstructive pneumonia,  pulmonary embolism, COPD  exacerbation. - Currently on HFNC 20 L/min at 90% FiO2 and saturating in the low 90s. - Chest x-ray 11/23 as documented above.  Microcytic anemia - May be related to anemia of chronic disease or iron deficiency anemia - Stable.  Paroxysmal A. fib: - Continue dofetilide and diltiazem for rate control.  Issues with hypotension 11/23.  Hypotension has resolved. - Continue IV heparin for now.  Hypotension - Discontinue Imdur.  Resolved for now.  Chronic diastolic CHF - Clinically euvolemic.  CAD - Nitrates have been held due to hypotension.  May consider resuming at reduced dose  Hyperlipidemia - Continue statins.  Oral thrush - Continue nystatin swish and swallow.  Improving.  Depression - Continue fluoxetine  Hypothyroid - Continue levothyroxine  Body mass index is 22.68 kg/m.  Nutritional Status Nutrition Problem: Moderate Malnutrition Etiology: chronic illness (COPD, CHF) Signs/Symptoms: moderate fat depletion, severe muscle depletion Interventions: Boost Breeze, MVI  Pressure Ulcer:     DVT prophylaxis:   Currently on IV heparin drip   Code Status: DNR Family Communication: I discussed in detail with patient's son on 11/24, updated care and answered all questions.  Updated patient with her cancer diagnosis.  She indicates that she will update her family with this diagnosis Disposition:  Status is: Inpatient  Remains inpatient appropriate because: Acute respiratory failure with hypoxia on HFNC 20 L/min, FiO2 90%, on IV antibiotics for pneumonia, IV heparin for PE and critically ill.        Consultants:   Interventional radiology PCCM Medical oncology  Procedures:   CT-guided needle biopsy of lung mass by interventional radiology on 11/21  Antimicrobials:   IV ceftriaxone 11/20 >   Subjective:  Back pain somewhat better after lidocaine patch.  Hemoptysis has significantly improved or even resolved.  Breathing so-so.   Informed her of her cancer diagnosis.  She reported that her spouse passed away from lung cancer in July 2021.  She had plans to sell her house and "enjoy life" and says now she has to make new dreams.  Reassured her and comforted her.  Objective:   Vitals:   04/18/21 1435 04/18/21 1934 04/19/21 0100 04/19/21 0507  BP:  (!) 141/76  123/74  Pulse: 79 85  84  Resp: 18 19  20   Temp:  98.1 F (36.7 C)  98.1 F (36.7 C)  TempSrc:  Oral  Oral  SpO2: 93% (!) 89% 93% 90%  Weight:    56.2 kg  Height:        General exam: Elderly female, moderately built, frail, chronically ill looking and cachectic, lying propped up in bed and in no distress.  Bitemporal wasting.  Oral thrush improved. Respiratory system: Absent breath sounds left upper lung fields and bronchial breath sounds in the left lower lung fields.  Harsh breath sounds right lung fields.  No increased work of breathing.  Not much change in exam. Cardiovascular system: S1 and S2 heard, RRR.  No JVD, murmurs or pedal edema.  Telemetry personally reviewed: Sinus rhythm with frequent PACs. Gastrointestinal system: Abdomen is nondistended, soft and nontender. No organomegaly or masses felt. Normal bowel sounds heard. Central nervous system: Alert and oriented. No focal neurological deficits. Extremities: Symmetric 5 x 5 power. Skin: No rashes, lesions or ulcers Psychiatry: Judgement and insight appear normal. Mood & affect appropriate.     Data Reviewed:   I have personally reviewed following labs and imaging studies   CBC: Recent Labs  Lab 04/15/21 0039 04/16/21 0147 04/17/21 0053 04/18/21 0132  04/19/21 0221  WBC 8.7   < > 14.9* 10.7* 12.8*  NEUTROABS 8.4*  --   --   --   --   HGB 9.0*   < > 8.7* 9.4* 9.8*  HCT 29.9*   < > 29.6* 31.4* 32.3*  MCV 75.3*   < > 76.9* 76.2* 76.4*  PLT 231   < > 282 216 215   < > = values in this interval not displayed.    Basic Metabolic Panel: Recent Labs  Lab 04/14/21 0150 04/15/21 0039  04/16/21 0147 04/17/21 0053 04/18/21 0132  NA  --  131* 136 134* 134*  K  --  4.0 3.8 4.2 4.1  CL  --  93* 97* 98 97*  CO2  --  30 29 27 29   GLUCOSE  --  165* 153* 117* 140*  BUN  --  10 14 15 13   CREATININE  --  0.74 0.84 0.84 0.70  CALCIUM  --  8.5* 8.7* 8.6* 8.4*  MG 2.0  --   --   --   --     Liver Function Tests: No results for input(s): AST, ALT, ALKPHOS, BILITOT, PROT, ALBUMIN in the last 168 hours.  CBG: No results for input(s): GLUCAP in the last 168 hours.  Microbiology Studies:   Recent Results (from the past 240 hour(s))  Resp Panel by RT-PCR (Flu A&B, Covid) Nasopharyngeal Swab     Status: None   Collection Time: 04/11/21  3:04 AM   Specimen: Nasopharyngeal Swab; Nasopharyngeal(NP) swabs in vial transport medium  Result Value Ref Range Status   SARS Coronavirus 2 by RT PCR NEGATIVE NEGATIVE Final    Comment: (NOTE) SARS-CoV-2 target nucleic acids are NOT DETECTED.  The SARS-CoV-2 RNA is generally detectable in upper respiratory specimens during the acute phase of infection. The lowest concentration of SARS-CoV-2 viral copies this assay can detect is 138 copies/mL. A negative result does not preclude SARS-Cov-2 infection and should not be used as the sole basis for treatment or other patient management decisions. A negative result may occur with  improper specimen collection/handling, submission of specimen other than nasopharyngeal swab, presence of viral mutation(s) within the areas targeted by this assay, and inadequate number of viral copies(<138 copies/mL). A negative result must be combined with clinical observations, patient history, and epidemiological information. The expected result is Negative.  Fact Sheet for Patients:  EntrepreneurPulse.com.au  Fact Sheet for Healthcare Providers:  IncredibleEmployment.be  This test is no t yet approved or cleared by the [place] FDA and  has been authorized for  detection and/or diagnosis of SARS-CoV-2 by FDA under an Emergency Use Authorization (EUA). This EUA will remain  in effect (meaning this test can be used) for the duration of the COVID-19 declaration under Section 564(b)(1) of the Act, 21 U.S.C.section 360bbb-3(b)(1), unless the authorization is terminated  or revoked sooner.       Influenza A by PCR NEGATIVE NEGATIVE Final   Influenza B by PCR NEGATIVE NEGATIVE Final    Comment: (NOTE) The Xpert Xpress SARS-CoV-2/FLU/RSV plus assay is intended as an aid in the diagnosis of influenza from Nasopharyngeal swab specimens and should not be used as a sole basis for treatment. Nasal washings and aspirates are unacceptable for Xpert Xpress SARS-CoV-2/FLU/RSV testing.  Fact Sheet for Patients: EntrepreneurPulse.com.au  Fact Sheet for Healthcare Providers: IncredibleEmployment.be  This test is not yet approved or cleared by the [place] FDA and has been authorized for detection and/or diagnosis of SARS-CoV-2 by FDA  under an Emergency Use Authorization (EUA). This EUA will remain in effect (meaning this test can be used) for the duration of the COVID-19 declaration under Section 564(b)(1) of the Act, 21 U.S.C. section 360bbb-3(b)(1), unless the authorization is terminated or revoked.  Performed at [facility], [place] [address] [place],  [place] [address]     Radiology Studies:  DG CHEST PORT 1 VIEW  Result Date: 04/17/2021 CLINICAL DATA:  Acute respiratory failure with hypoxia. EXAM: PORTABLE CHEST 1 VIEW COMPARISON:  Chest x-ray dated April 15, 2021. FINDINGS: Progressive now complete opacification of the left hemithorax. Emphysematous right lung. No pneumothorax. Stable heart size. No significant mediastinal shift. IMPRESSION: 1. Progressive now complete opacification of the left hemithorax which could reflect large pleural effusion or progressive consolidation/collapse. Electronically  Signed   By: Titus Dubin M.D.   On: 04/17/2021 15:57    Scheduled Meds:    cyclobenzaprine  5 mg Oral TID   diltiazem  240 mg Oral Daily   dofetilide  125 mcg Oral BID   feeding supplement  1 Container Oral TID BM   FLUoxetine  40 mg Oral Daily   ipratropium-albuterol  3 mL Nebulization Q6H   levothyroxine  75 mcg Oral Daily   lidocaine  1 patch Transdermal Q24H   lidocaine  1 patch Transdermal Q24H   mouth rinse  15 mL Mouth Rinse BID   methylPREDNISolone (SOLU-MEDROL) injection  40 mg Intravenous Q24H   multivitamin with minerals  1 tablet Oral Daily   nystatin  5 mL Oral QID   pantoprazole  40 mg Oral Daily   rosuvastatin  40 mg Oral Daily   senna  2 tablet Oral BID   sodium chloride flush  3 mL Intravenous Q12H    Continuous Infusions:    cefTRIAXone (ROCEPHIN)  IV 1 g (04/18/21 1433)   heparin Stopped (04/19/21 0944)     LOS: 7 days     Vernell Leep, MD,  FACP, [facility], [facility], [facility] (Care Management Physician Certified) [place]  To contact the attending provider between 7A-7P or the covering provider during after hours 7P-7A, please log into the web site [email or web address] and access using universal [place] password for that web site. If you do not have the password, please call the hospital operator.  04/19/2021, 12:37 PM

## 2021-04-19 NOTE — Procedures (Signed)
Thoracentesis  Procedure Note  Andrea [name]  [id number]  May 17, 1941  Date:04/19/21  Time:1:38 PM   Provider Performing:Brent Daisia Slomski   Procedure: Thoracentesis with imaging guidance (50932)  Indication(s) Pleural Effusion  Consent Risks of the procedure as well as the alternatives and risks of each were explained to the patient and/or caregiver.  Consent for the procedure was obtained and is signed in the bedside chart  Anesthesia Topical only with 1% lidocaine    Time Out Verified patient identification, verified procedure, site/side was marked, verified correct patient position, special equipment/implants available, medications/allergies/relevant history reviewed, required imaging and test results available.   Sterile Technique Maximal sterile technique including full sterile barrier drape, hand hygiene, sterile gown, sterile gloves, mask, hair covering, sterile ultrasound probe cover (if used).  Procedure Description Ultrasound was used to identify appropriate pleural anatomy for placement and overlying skin marked.  Area of drainage cleaned and draped in sterile fashion. Lidocaine was used to anesthetize the skin and subcutaneous tissue.  400 cc's of serosanguinous appearing fluid was drained from the left pleural space. Catheter then removed and bandaid applied to site.   Complications/Tolerance None; patient tolerated the procedure well. Chest X-ray is ordered to confirm no post-procedural complication.   EBL Minimal   Specimen(s) Pleural fluid  Roselie Awkward, MD [place] [facility] Pager: [phone] Cell: [phone] After 7:00 pm call Elink  [phone]

## 2021-04-19 NOTE — Progress Notes (Signed)
OT Cancellation Note  Patient Details Name: Andrea [name] MRN: [id number] DOB: Nov 20, 1940   Cancelled Treatment:    Reason Eval/Treat Not Completed: Patient at procedure or test/ unavailable (Patient had bedside thoracentesis and nursing suggested withholding therapy today) Lodema Hong, [place]  Pager [phone] Office [place] 04/19/2021, 2:27 PM

## 2021-04-19 NOTE — Progress Notes (Signed)
Mobility Specialist Progress Note    04/19/21 1214  Mobility  Activity Refused mobility   Pt stated she was too wiped out to get up. Will f/u as schedule permits.   [facility] Mobility Specialist  M.S. Primary Phone: [phone] M.S. Secondary Phone: [phone]

## 2021-04-19 NOTE — Progress Notes (Signed)
ANTICOAGULATION CONSULT NOTE - Follow Up Consult  Pharmacy Consult for Heparin Indication: pulmonary embolus  Allergies  Allergen Reactions   Erythromycin Other (See Comments)    Made the patient's tongue "burn"   Tape Rash and Other (See Comments)    PLEASE USE PAPER TAPE!!   Meperidine Nausea And Vomiting   Shellfish Allergy Nausea And Vomiting   Ciprofloxacin Rash and Other (See Comments)    Rash from IV   Codeine Nausea And Vomiting   Latex Rash and Other (See Comments)    Made the patient's skin "burn," also   Penicillins Rash and Other (See Comments)    Has patient had a PCN reaction causing immediate rash, facial/tongue/throat swelling, SOB or lightheadedness with hypotension: Unk Has patient had a PCN reaction causing severe rash involving mucus membranes or skin necrosis: No Has patient had a PCN reaction that required hospitalization: No Has patient had a PCN reaction occurring within the last 10 years: No If all of the above answers are "NO", then may proceed with Cephalosporin use.     Patient Measurements: Height: 5\' 2"  (157.5 cm) Weight: 56.2 kg (124 lb) IBW/kg (Calculated) : 50.1 Heparin Dosing Weight: 56.7 kg  Vital Signs: Temp: 98.1 F (36.7 C) (11/25 0507) Temp Source: Oral (11/25 0507) BP: 123/74 (11/25 0507) Pulse Rate: 84 (11/25 0507)  Labs: Recent Labs    04/17/21 0053 04/18/21 0132 04/18/21 0914 04/18/21 1825 04/19/21 0221  HGB 8.7* 9.4*  --   --  9.8*  HCT 29.6* 31.4*  --   --  32.3*  PLT 282 216  --   --  215  HEPARINUNFRC 0.42  --  0.84* 0.74* 0.69  CREATININE 0.84 0.70  --   --   --      Estimated Creatinine Clearance: 42.9 mL/min (by C-G formula based on SCr of 0.7 mg/dL).   Medications:  Scheduled:   cyclobenzaprine  5 mg Oral TID   diltiazem  240 mg Oral Daily   dofetilide  125 mcg Oral BID   feeding supplement  1 Container Oral TID BM   FLUoxetine  40 mg Oral Daily   ipratropium-albuterol  3 mL Nebulization Q6H    levothyroxine  75 mcg Oral Daily   lidocaine  1 patch Transdermal Q24H   lidocaine  1 patch Transdermal Q24H   mouth rinse  15 mL Mouth Rinse BID   methylPREDNISolone (SOLU-MEDROL) injection  40 mg Intravenous Q24H   multivitamin with minerals  1 tablet Oral Daily   nystatin  5 mL Oral QID   pantoprazole  40 mg Oral Daily   rosuvastatin  40 mg Oral Daily   senna  2 tablet Oral BID   sodium chloride flush  3 mL Intravenous Q12H   Infusions:   cefTRIAXone (ROCEPHIN)  IV 1 g (04/18/21 1433)   heparin 1,600 Units/hr (04/18/21 2020)    Assessment: 80 yo F found to have PE on CT scan. Patient on Eliquis PTA for afib - (last dose 11/15 PM). Heparin initiated but switched to enoxaparin 11/17 given PE treatment in active cancer patient. Decision made to switch back to heparin in preparation for biopsy. Biopsy done 11/21, patient had hemoptysis and heparin was held.   Heparin resumed 11/22 AM. Pharmacy consulted to dose heparin.   Heparin level continues to be at higher end of therapeutic range (0.69).  No overt bleeding or complications noted.   Goal of Therapy:  Heparin level 0.3-0.7 units/ml Monitor platelets by anticoagulation protocol: Yes  Plan:  Decrease heparin to 1550 units/hr Check heparin level with AM labs.  F/u plans for oral anticoagulation eventually.  [place] Crane, Roylene Reason, BCCP Clinical Pharmacist  04/19/2021 9:33 AM   [facility] pharmacy phone numbers are listed on [email or web address]

## 2021-04-19 NOTE — Progress Notes (Signed)
[place] [facility] critical [facility] medicine  Bedside thoracentesis performed today, no complications, mild relief and shortness of breath after procedure performed. Only 400 cc of fluid removed.  I anticipate that this pleural fluid will reaccumulate, however, I do not think that draining the fluid will cause relief of shortness of breath or hypoxemia.  May need to consider Pleurx catheter placement when this reaccumulates.  Have sent fluid for cytology and other routine testing.  We will plan on seeing again on Monday, call us if situation changes over the weekend.  Roselie Awkward, MD [place] [facility] Pager: [phone] Cell: [phone] After 7:00 pm call Elink  [phone]

## 2021-04-19 NOTE — Progress Notes (Signed)
OT Cancellation Note  Patient Details Name: Andrea [name] MRN: [id number] DOB: February 13, 1941   Cancelled Treatment:    Reason Eval/Treat Not Completed: Patient declined, no reason specified (meeting with the physician) Lodema Hong, [place]  Pager [phone] Office [place] 04/19/2021, 1:29 PM

## 2021-04-20 DIAGNOSIS — I2699 Other pulmonary embolism without acute cor pulmonale: Secondary | ICD-10-CM | POA: Diagnosis not present

## 2021-04-20 DIAGNOSIS — C3412 Malignant neoplasm of upper lobe, left bronchus or lung: Secondary | ICD-10-CM | POA: Diagnosis not present

## 2021-04-20 DIAGNOSIS — R918 Other nonspecific abnormal finding of lung field: Secondary | ICD-10-CM | POA: Diagnosis not present

## 2021-04-20 DIAGNOSIS — J9601 Acute respiratory failure with hypoxia: Secondary | ICD-10-CM | POA: Diagnosis not present

## 2021-04-20 DIAGNOSIS — I482 Chronic atrial fibrillation, unspecified: Secondary | ICD-10-CM | POA: Diagnosis not present

## 2021-04-20 LAB — CBC
HCT: 36.9 % (ref 36.0–46.0)
Hemoglobin: 11.2 g/dL — ABNORMAL LOW (ref 12.0–15.0)
MCH: 23 pg — ABNORMAL LOW (ref 26.0–34.0)
MCHC: 30.4 g/dL (ref 30.0–36.0)
MCV: 75.6 fL — ABNORMAL LOW (ref 80.0–100.0)
Platelets: 261 10*3/uL (ref 150–400)
RBC: 4.88 MIL/uL (ref 3.87–5.11)
RDW: 19.6 % — ABNORMAL HIGH (ref 11.5–15.5)
WBC: 15.4 10*3/uL — ABNORMAL HIGH (ref 4.0–10.5)
nRBC: 0 % (ref 0.0–0.2)

## 2021-04-20 LAB — HEPARIN LEVEL (UNFRACTIONATED): Heparin Unfractionated: 0.63 IU/mL (ref 0.30–0.70)

## 2021-04-20 MED ORDER — IPRATROPIUM-ALBUTEROL 0.5-2.5 (3) MG/3ML IN SOLN
3.0000 mL | Freq: Two times a day (BID) | RESPIRATORY_TRACT | Status: DC
Start: 2021-04-20 — End: 2021-04-24
  Administered 2021-04-20 – 2021-04-24 (×8): 3 mL via RESPIRATORY_TRACT
  Filled 2021-04-20 (×9): qty 3

## 2021-04-20 MED ORDER — DOCUSATE SODIUM 100 MG PO CAPS
100.0000 mg | ORAL_CAPSULE | Freq: Two times a day (BID) | ORAL | Status: DC
  Administered 2021-04-20 – 2021-04-25 (×9): 100 mg via ORAL
  Filled 2021-04-20 (×10): qty 1

## 2021-04-20 NOTE — Progress Notes (Signed)
ANTICOAGULATION CONSULT NOTE - Follow Up Consult  Pharmacy Consult for Heparin Indication: pulmonary embolus  Allergies  Allergen Reactions   Erythromycin Other (See Comments)    Made the patient's tongue "burn"   Tape Rash and Other (See Comments)    PLEASE USE PAPER TAPE!!   Meperidine Nausea And Vomiting   Shellfish Allergy Nausea And Vomiting   Ciprofloxacin Rash and Other (See Comments)    Rash from IV   Codeine Nausea And Vomiting   Latex Rash and Other (See Comments)    Made the patient's skin "burn," also   Penicillins Rash and Other (See Comments)    Has patient had a PCN reaction causing immediate rash, facial/tongue/throat swelling, SOB or lightheadedness with hypotension: Unk Has patient had a PCN reaction causing severe rash involving mucus membranes or skin necrosis: No Has patient had a PCN reaction that required hospitalization: No Has patient had a PCN reaction occurring within the last 10 years: No If all of the above answers are "NO", then may proceed with Cephalosporin use.     Patient Measurements: Height: 5\' 2"  (157.5 cm) Weight: 56.2 kg (124 lb) IBW/kg (Calculated) : 50.1 Heparin Dosing Weight: 56.7 kg  Vital Signs: Temp: 97.6 F (36.4 C) (11/26 0450) Temp Source: Axillary (11/26 0450) BP: 95/74 (11/26 0450) Pulse Rate: 69 (11/26 0450)  Labs: Recent Labs    04/18/21 0132 04/18/21 0914 04/18/21 1825 04/19/21 0221 04/20/21 0228  HGB 9.4*  --   --  9.8* 11.2*  HCT 31.4*  --   --  32.3* 36.9  PLT 216  --   --  215 261  HEPARINUNFRC  --    < > 0.74* 0.69 0.63  CREATININE 0.70  --   --   --   --    < > = values in this interval not displayed.    Estimated Creatinine Clearance: 42.9 mL/min (by C-G formula based on SCr of 0.7 mg/dL).   Medications:  Scheduled:   cyclobenzaprine  5 mg Oral TID   diltiazem  240 mg Oral Daily   dofetilide  125 mcg Oral BID   feeding supplement  1 Container Oral TID BM   FLUoxetine  40 mg Oral Daily    ipratropium-albuterol  3 mL Nebulization Q6H   levothyroxine  75 mcg Oral Daily   lidocaine  1 patch Transdermal Q24H   lidocaine  1 patch Transdermal Q24H   mouth rinse  15 mL Mouth Rinse BID   methylPREDNISolone (SOLU-MEDROL) injection  40 mg Intravenous Q24H   multivitamin with minerals  1 tablet Oral Daily   nystatin  5 mL Oral QID   pantoprazole  40 mg Oral Daily   rosuvastatin  40 mg Oral Daily   senna  2 tablet Oral BID   sodium chloride flush  3 mL Intravenous Q12H   Infusions:   cefTRIAXone (ROCEPHIN)  IV 1 g (04/19/21 1440)   heparin 1,550 Units/hr (04/19/21 1530)    Assessment: 80 yo F found to have PE on CT scan. Patient on Eliquis PTA for afib - (last dose 11/15 PM). Heparin initiated but switched to enoxaparin 11/17 given PE treatment in active cancer patient. Decision made to switch back to heparin in preparation for biopsy. Biopsy done 11/21, patient had hemoptysis and heparin was held.    Heparin resumed 11/22 AM. Pharmacy consulted to dose heparin.   Heparin level today is therapeutic at 0.63, on 1550 units/hr. Hgb 11.2, plt 261. No lines issues or signs/symptoms of  bleeding noted per RN.  Goal of Therapy:  Heparin level 0.3-0.7 units/ml Monitor platelets by anticoagulation protocol: Yes   Plan:  Continue heparin at 1550 units/hr. Daily CBC, heparin level. Monitor for signs/symptoms of bleeding.   Vance Peper, PharmD PGY1 Pharmacy Resident Phone [phone] 04/20/2021 7:23 AM   Please check AMION for all [place] phone numbers After 10:00 PM, call [place] [phone]

## 2021-04-20 NOTE — Progress Notes (Signed)
Palliative Care Progress Note, Assessment & Plan   Patient Name: Andrea [name]       Date: 04/20/2021 DOB: Jul 27, 1940  Age: 80 y.o. MRN#: [id number] Attending Physician: Modena Jansky, MD Primary Care Physician: Mosie Lukes, MD Admit Date: 04/10/2021  Reason for Consultation/Follow-up: Establishing goals of care  Subjective: Patient is lying in bed asleep in no apparent distress.  No family at bedside presently.  HPI: Patient is an 80 year old female with a past medical history significant for OSA, A. fib (Eliquis), CAD, chronic diastolic congestive heart failure, COPD with chronic hypoxic respiratory failure, and hypothyroidism.  Patient was admitted with acute onset of right chest wall pain.  Prior to admission patient had a known left lung mass.  CTA of the chest showed left upper lobe mass with increasing diffuse left lung airspace disease.  CT-guided lung biopsy performed on 11/21 revealed adenocarcinoma.  During her hospitalization patient has had progressive acute respiratory failure requiring high flow nasal cannula.  Plan was to attempt wean off of oxygen.  However, patient has required increased demands.  Patient is currently on 25 L high flow nasal cannula -up from yesterday settings.  Pulm medicine was consulted to discuss goals of care.  Summary of counseling/coordination of care: After reviewing the patient's chart, epic notes, labs, and imaging, I responded to a call from patient's son Octavia Bruckner.  He shared concerns about family members gaining information about the patient.  He shared that he would like the patient's information password protected.  I gave him the nurse secretaries number.  I also spoke with the nurse secretary of [address] sharing Tim's concerns.  During our discussion Tim  also asked for medical update.  I outlined that Dr. Lake Bells had removed 400 cc of fluid during his thoracentesis yesterday.  Also conveyed that Dr. Lake Bells may be recommending consideration of a Pleurx catheter if the fluid accumulates.  I was also clear that Dr. Lake Bells does not think that draining the fluid will cause relief of shortness of breath or hypoxemia.  Also let him know that the fluid removed during the thoracentesis was sent for cytology and other routine testing.  I outlined the Dr. Lake Bells will see the patient again on Monday but should he have any further questions we can certainly try to get him in touch with Dr. Lake Bells.  Him declined this and appreciated the update.  I also assessed the patient at bedside.  She is in no apparent distress and has no signs of dyspnea, increased work of breath, use of accessory muscles for breathing, or signs of agitation or anxiety.  Nursing staff shared that patient looks significantly worse to her since last taking care of her 2 days ago.  Patient's color does appear pale, a change from what nursing staff endorses was not present 2 days ago.  I am concerned that the patient is not able to wean down off of oxygen.  With her poor p.o. intake and increased oxygen demands she may likely be a candidate for hospice.  Palliative medicine will continue to follow the patient throughout her hospitalization and be in contact with the family.  Questions and concerns from [place] were answered.  Code Status:  DNR  Prognosis: Unable to determine  Discharge Planning: To Be Determined  Recommendations/Plan: Password protected chart at son Tim's request  Care plan was discussed with nursing, patient's son Octavia Bruckner  Physical Exam Constitutional:      General: She is not in acute distress.    Appearance: She is ill-appearing.  HENT:     Head: Normocephalic and atraumatic.     Mouth/Throat:     Mouth: Mucous membranes are moist.  Cardiovascular:     Rate and  Rhythm: Normal rate.     Pulses: Normal pulses.  Pulmonary:     Effort: Pulmonary effort is normal.  Abdominal:     Palpations: Abdomen is soft.  Musculoskeletal:     Comments: Generalized weakness  Skin:    General: Skin is warm and dry.  Neurological:     Comments: Somnolent, non verbal            Flowsheet Rows    Flowsheet Row Most Recent Value  Intake Tab   Referral Department Hospitalist  Unit at Time of Referral Cardiac/Telemetry Unit  Palliative Care Primary Diagnosis Cancer  Date Notified 04/17/21  Palliative Care Type New Palliative care  Reason for referral Clarify Goals of Care  Date of Admission 04/10/21  Date first seen by Palliative Care 04/17/21  # of days Palliative referral response time 0 Day(s)  # of days IP prior to Palliative referral 7  Clinical Assessment   Psychosocial & Spiritual Assessment   Palliative Care Outcomes         Total Time 15 minutes  Greater than 50%  of this time was spent counseling and coordinating care related to the above assessment and plan.  Thank you for allowing the Palliative Medicine Team to assist in the care of this patient.  Palestine Ilsa Iha, FNP-BC Palliative Medicine Team Team Phone # [phone]

## 2021-04-20 NOTE — Progress Notes (Signed)
PT Cancellation Note  Patient Details Name: Andrea [name] MRN: [id number] DOB: 1941-03-17   Cancelled Treatment:    Reason Eval/Treat Not Completed: Patient declined, no reason specified. 04/20/2021  Ginger Carne., PT Acute Rehabilitation Services [phone]  (pager) [phone]  (office)   Tessie Fass Raynald Rouillard 04/20/2021, 4:45 PM

## 2021-04-20 NOTE — Progress Notes (Signed)
PROGRESS NOTE   Andrea [name]  KZS:[id number]    DOB: 1940/06/09    DOA: 04/10/2021  PCP: Mosie Lukes, MD   I have briefly reviewed patients previous medical records in [facility].  Chief complaint: Dyspnea   Brief Narrative:  80 year old female, medical history significant for COPD, OSA on CPAP, CAD s/p CABG, hypertension, PAD, former smoker, history of PE in the past after her CABG, A. fib, chronic diastolic CHF, hypothyroid, recently saw pulmonology/Dr. Lamonte Sakai on 04/02/2021 for a left upper lobe mass and lytic lesion on the posterior right ninth rib identified on CT chest 03/07/2021, significantly hypermetabolic on PET 73/22/0254, planned outpatient biopsy on 04/15/2021, now admitted 04/10/2021 with complaints of acute onset of severe right-sided rib cage pain, worse with coughing and dyspnea.  CT chest showed left upper lobe with bland thrombus versus tumor thrombus, large area of consolidation in the left upper lobe with lymphangitic spread, right posterior ninth rib lesion with pathological fracture.  Admitted for large left upper lobe mass, biopsy now confirms adenocarcinoma/non-small cell lung cancer with acute pulmonary embolism/possible tumor thrombus, pathological ninth rib fracture, postobstructive pneumonia, possible COPD exacerbation and acute on chronic hypoxic respiratory failure. Hospital course complicated by progressive acute respiratory failure with hypoxia requiring HFNC.  PCCM consulted and s/p thoracentesis 11/25 yielding 400 mL.  Palliative care consulted for goals of care.  Oncology consulted.   Assessment & Plan:  Principal Problem:   Pulmonary embolism (HCC) Active Problems:   CAD (coronary artery disease)   Hypothyroidism   Chronic respiratory failure with hypoxia (HCC)   Atrial fibrillation, chronic (HCC)   COPD (chronic obstructive pulmonary disease) (HCC)   Chronic diastolic CHF (congestive heart failure) (HCC)   Lung mass   Pathological fracture of  one rib   Microcytic anemia   Malnutrition of moderate degree   Large left upper lobe mass, non-small cell lung cancer/adenocarcinoma, complicated by right posterior ninth rib fracture, lymphangitic spread to LUL, chest pain, hemoptysis, postobstructive pneumonia and pleural effusion: - Was supposed to have outpatient evaluation including biopsy but had to be hospitalized - CT-guided needle biopsy by IR on 04/15/2021.  Pathology confirms adenocarcinoma.   -Oncology input appreciated.  Metastatic disease, lung cancer not curable.  Given high O2 demands, poor performance status, not candidate for systemic treatment.  Recommended comfort based approach and referral to hospice but patient wishes to think about it.  Palliative care team to continue to work with patient on this. - Supportive treatment of dyspnea/hypoxia with oxygen supplements, pulmonary toilet and pain with multimodality pain control.  Hemoptysis has resolved. - Palliative care follow-up appreciated.  Is DNR/DNI, wants to explore treatment options.  Await palliative team follow-up. - Chest x-ray 11/23: Complete whiteout of left lung fields likely multifactorial-large lung mass, pleural effusion, consolidation or collapse.   - PCCM follow-up appreciated.  S/p left thoracentesis 11/25 yielding only 400 mL with mild relief of dyspnea.  Will consider Pleurx catheter if effusion recurs.  Left upper lobe PE (bland thrombus versus tumor thrombus): - Continue IV heparin anticoagulation for now. - 2D echo showed LVEF of 50-55%, moderate hypokinesis of left ventricle based mid inferior wall and inferior septal wall.  Suspected left upper lobe postobstructive pneumonia: - Continue empiric IV antibiotics.  Day 7 of IV ceftriaxone today.  Complete total 7 days course.  1 cm right upper lobe subpleural nodule: - Outpatient follow-up.  May be part of the above malignant process.  COPD exacerbation: - Emphysema noted on CTA chest -  Continue IV  Solu-Medrol, IV antibiotics, inhaled steroids.  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 - Currently on HFNC 25 L, 100% FiO2 and oxygen saturation at 90.  Microcytic anemia - May be related to anemia of chronic disease or iron deficiency anemia - Stable.  Paroxysmal A. fib: - Continue dofetilide and diltiazem for rate control.  Issues with hypotension 11/23.  Hypotension has resolved. - Continue IV heparin for now.  Hypotension - Discontinue Imdur.  Resolved for now.  Chronic diastolic CHF - Clinically euvolemic.  CAD - Nitrates have been held due to hypotension.  May consider resuming at reduced dose  Hyperlipidemia - Continue statins.  Oral thrush - Continue nystatin swish and swallow.  Improving.  Depression - Continue fluoxetine  Hypothyroid - Continue levothyroxine  Body mass index is 22.68 kg/m.  Nutritional Status Nutrition Problem: Moderate Malnutrition Etiology: chronic illness (COPD, CHF) Signs/Symptoms: moderate fat depletion, severe muscle depletion Interventions: Boost Breeze, MVI  Pressure Ulcer:     DVT prophylaxis:   Currently on IV heparin drip   Code Status: DNR Family Communication: None at bedside.  I discussed with son couple days ago, prior to biopsy results.  Patient is aware of her cancer diagnosis but not her family.  She wishes to update family herself Disposition:  Status is: Inpatient  Remains inpatient appropriate because: Acute respiratory failure with hypoxia needing HFNC.  Medically unstable.        Consultants:   Interventional radiology PCCM Medical oncology  Procedures:   CT-guided needle biopsy of lung mass by interventional radiology on 11/21  Antimicrobials:   IV ceftriaxone 11/20 >   Subjective:  ?  Somewhat confused.  Breathing slightly better.  No  hemoptysis reported.  Ongoing back pain.  Objective:   Vitals:   04/19/21 2046 04/20/21 0450 04/20/21 0752 04/20/21 0854  BP:  95/74  123/86  Pulse:  69 76 88  Resp: 20 18 20 20   Temp:  97.6 F (36.4 C)    TempSrc:  Axillary    SpO2:  93% 94% 90%  Weight:      Height:        General exam: Elderly female, moderately built, frail, chronically ill looking and cachectic, lying propped up in bed and in no distress.  Bitemporal wasting.  Oral thrush improved. Respiratory system: Reduced breath sounds left lung fields.  No significant improvement post thoracentesis.  Harsh breath sounds right lung fields.  No wheezing or crackles.  No increased work of breathing Cardiovascular system: S1 and S2 heard, RRR.  No JVD or pedal edema.  Telemetry personally reviewed: Sinus rhythm with frequent PACs. Gastrointestinal system: Abdomen is nondistended, soft and nontender. No organomegaly or masses felt. Normal bowel sounds heard. Central nervous system: Alert and oriented. No focal neurological deficits. Extremities: Symmetric 5 x 5 power. Skin: No rashes, lesions or ulcers Psychiatry: Judgement and insight appear normal. Mood & affect appropriate.     Data Reviewed:   I have personally reviewed following labs and imaging studies   CBC: Recent Labs  Lab 04/15/21 0039 04/16/21 0147 04/18/21 0132 04/19/21 0221 04/20/21 0228  WBC 8.7   < > 10.7* 12.8* 15.4*  NEUTROABS 8.4*  --   --   --   --   HGB 9.0*   < > 9.4* 9.8* 11.2*  HCT 29.9*   < > 31.4* 32.3* 36.9  MCV 75.3*   < >  76.2* 76.4* 75.6*  PLT 231   < > 216 215 261   < > = values in this interval not displayed.    Basic Metabolic Panel: Recent Labs  Lab 04/14/21 0150 04/15/21 0039 04/16/21 0147 04/17/21 0053 04/18/21 0132  NA  --  131* 136 134* 134*  K  --  4.0 3.8 4.2 4.1  CL  --  93* 97* 98 97*  CO2  --  30 29 27 29   GLUCOSE  --  165* 153* 117* 140*  BUN  --  10 14 15 13   CREATININE  --  0.74 0.84 0.84 0.70  CALCIUM   --  8.5* 8.7* 8.6* 8.4*  MG 2.0  --   --   --   --     Liver Function Tests: No results for input(s): AST, ALT, ALKPHOS, BILITOT, PROT, ALBUMIN in the last 168 hours.  CBG: No results for input(s): GLUCAP in the last 168 hours.  Microbiology Studies:   Recent Results (from the past 240 hour(s))  Resp Panel by RT-PCR (Flu A&B, Covid) Nasopharyngeal Swab     Status: None   Collection Time: 04/11/21  3:04 AM   Specimen: Nasopharyngeal Swab; Nasopharyngeal(NP) swabs in vial transport medium  Result Value Ref Range Status   SARS Coronavirus 2 by RT PCR NEGATIVE NEGATIVE Final    Comment: (NOTE) SARS-CoV-2 target nucleic acids are NOT DETECTED.  The SARS-CoV-2 RNA is generally detectable in upper respiratory specimens during the acute phase of infection. The lowest concentration of SARS-CoV-2 viral copies this assay can detect is 138 copies/mL. A negative result does not preclude SARS-Cov-2 infection and should not be used as the sole basis for treatment or other patient management decisions. A negative result may occur with  improper specimen collection/handling, submission of specimen other than nasopharyngeal swab, presence of viral mutation(s) within the areas targeted by this assay, and inadequate number of viral copies(<138 copies/mL). A negative result must be combined with clinical observations, patient history, and epidemiological information. The expected result is Negative.  Fact Sheet for Patients:  EntrepreneurPulse.com.au  Fact Sheet for Healthcare Providers:  IncredibleEmployment.be  This test is no t yet approved or cleared by the [place] FDA and  has been authorized for detection and/or diagnosis of SARS-CoV-2 by FDA under an Emergency Use Authorization (EUA). This EUA will remain  in effect (meaning this test can be used) for the duration of the COVID-19 declaration under Section 564(b)(1) of the Act, 21 U.S.C.section  360bbb-3(b)(1), unless the authorization is terminated  or revoked sooner.       Influenza A by PCR NEGATIVE NEGATIVE Final   Influenza B by PCR NEGATIVE NEGATIVE Final    Comment: (NOTE) The Xpert Xpress SARS-CoV-2/FLU/RSV plus assay is intended as an aid in the diagnosis of influenza from Nasopharyngeal swab specimens and should not be used as a sole basis for treatment. Nasal washings and aspirates are unacceptable for Xpert Xpress SARS-CoV-2/FLU/RSV testing.  Fact Sheet for Patients: EntrepreneurPulse.com.au  Fact Sheet for Healthcare Providers: IncredibleEmployment.be  This test is not yet approved or cleared by the [place] FDA and has been authorized for detection and/or diagnosis of SARS-CoV-2 by FDA under an Emergency Use Authorization (EUA). This EUA will remain in effect (meaning this test can be used) for the duration of the COVID-19 declaration under Section 564(b)(1) of the Act, 21 U.S.C. section 360bbb-3(b)(1), unless the authorization is terminated or revoked.  Performed at [facility], [address],  [place] [address]     Radiology Studies:  DG CHEST PORT 1 VIEW  Result Date: 04/19/2021 CLINICAL DATA:  Post thoracentesis EXAM: PORTABLE CHEST 1 VIEW COMPARISON:  Portable exam 1419 hours compared to 04/17/2021 FINDINGS: Normal heart size post median sternotomy likely CABG. Coronary stent noted. Atherosclerotic calcifications aorta. Subtotal opacification of the LEFT lung with slightly improved aeration and decreased pleural effusion since previous exam. No pneumothorax. RIGHT lung clear. Bones demineralized. IMPRESSION: No pneumothorax following LEFT thoracentesis. Electronically Signed   By: Lavonia Dana M.D.   On: 04/19/2021 14:38    Scheduled Meds:    cyclobenzaprine  5 mg Oral TID   diltiazem  240 mg Oral Daily   dofetilide  125 mcg Oral BID   feeding supplement  1 Container Oral TID BM    FLUoxetine  40 mg Oral Daily   ipratropium-albuterol  3 mL Nebulization Q6H   levothyroxine  75 mcg Oral Daily   lidocaine  1 patch Transdermal Q24H   lidocaine  1 patch Transdermal Q24H   mouth rinse  15 mL Mouth Rinse BID   methylPREDNISolone (SOLU-MEDROL) injection  40 mg Intravenous Q24H   multivitamin with minerals  1 tablet Oral Daily   nystatin  5 mL Oral QID   pantoprazole  40 mg Oral Daily   rosuvastatin  40 mg Oral Daily   senna  2 tablet Oral BID   sodium chloride flush  3 mL Intravenous Q12H    Continuous Infusions:    cefTRIAXone (ROCEPHIN)  IV 1 g (04/19/21 1440)   heparin 1,550 Units/hr (04/19/21 1530)     LOS: 8 days     Vernell Leep, MD,  FACP, [facility], [facility], [facility] (Care Management Physician Certified) [place]  To contact the attending provider between 7A-7P or the covering provider during after hours 7P-7A, please log into the web site [email or web address] and access using universal [place] password for that web site. If you do not have the password, please call the hospital operator.  04/20/2021, 8:59 AM

## 2021-04-21 DIAGNOSIS — R0609 Other forms of dyspnea: Secondary | ICD-10-CM

## 2021-04-21 DIAGNOSIS — J9601 Acute respiratory failure with hypoxia: Secondary | ICD-10-CM | POA: Diagnosis not present

## 2021-04-21 DIAGNOSIS — I2699 Other pulmonary embolism without acute cor pulmonale: Secondary | ICD-10-CM | POA: Diagnosis not present

## 2021-04-21 DIAGNOSIS — J9611 Chronic respiratory failure with hypoxia: Secondary | ICD-10-CM | POA: Diagnosis not present

## 2021-04-21 DIAGNOSIS — I5032 Chronic diastolic (congestive) heart failure: Secondary | ICD-10-CM | POA: Diagnosis not present

## 2021-04-21 LAB — HEPARIN LEVEL (UNFRACTIONATED)
Heparin Unfractionated: 0.74 IU/mL — ABNORMAL HIGH (ref 0.30–0.70)
Heparin Unfractionated: 0.96 IU/mL — ABNORMAL HIGH (ref 0.30–0.70)
Heparin Unfractionated: 0.72 IU/mL — ABNORMAL HIGH (ref 0.30–0.70)

## 2021-04-21 LAB — BASIC METABOLIC PANEL
Anion gap: 12 (ref 5–15)
BUN: 17 mg/dL (ref 8–23)
CO2: 28 mmol/L (ref 22–32)
Calcium: 8.7 mg/dL — ABNORMAL LOW (ref 8.9–10.3)
Chloride: 97 mmol/L — ABNORMAL LOW (ref 98–111)
Creatinine, Ser: 0.8 mg/dL (ref 0.44–1.00)
GFR, Estimated: >60 mL/min (ref >60)
Glucose, Bld: 116 mg/dL — ABNORMAL HIGH (ref 70–99)
Potassium: 4 mmol/L (ref 3.5–5.1)
Sodium: 137 mmol/L (ref 135–145)

## 2021-04-21 LAB — CBC
HCT: 37.9 % (ref 36.0–46.0)
Hemoglobin: 11.6 g/dL — ABNORMAL LOW (ref 12.0–15.0)
MCH: 23 pg — ABNORMAL LOW (ref 26.0–34.0)
MCHC: 30.6 g/dL (ref 30.0–36.0)
MCV: 75 fL — ABNORMAL LOW (ref 80.0–100.0)
Platelets: 254 10*3/uL (ref 150–400)
RBC: 5.05 MIL/uL (ref 3.87–5.11)
RDW: 20.1 % — ABNORMAL HIGH (ref 11.5–15.5)
WBC: 12.2 10*3/uL — ABNORMAL HIGH (ref 4.0–10.5)
nRBC: 0 % (ref 0.0–0.2)

## 2021-04-21 NOTE — Progress Notes (Signed)
ANTICOAGULATION CONSULT NOTE - Follow Up Consult  Pharmacy Consult for Heparin Indication: pulmonary embolus  Allergies  Allergen Reactions   Erythromycin Other (See Comments)    Made the patient's tongue "burn"   Tape Rash and Other (See Comments)    PLEASE USE PAPER TAPE!!   Meperidine Nausea And Vomiting   Shellfish Allergy Nausea And Vomiting   Ciprofloxacin Rash and Other (See Comments)    Rash from IV   Codeine Nausea And Vomiting   Latex Rash and Other (See Comments)    Made the patient's skin "burn," also   Penicillins Rash and Other (See Comments)    Has patient had a PCN reaction causing immediate rash, facial/tongue/throat swelling, SOB or lightheadedness with hypotension: Unk Has patient had a PCN reaction causing severe rash involving mucus membranes or skin necrosis: No Has patient had a PCN reaction that required hospitalization: No Has patient had a PCN reaction occurring within the last 10 years: No If all of the above answers are "NO", then may proceed with Cephalosporin use.     Patient Measurements: Height: 5\' 2"  (157.5 cm) Weight: 56.2 kg (124 lb) IBW/kg (Calculated) : 50.1 Heparin Dosing Weight: 56.7 kg  Vital Signs: Temp: 97.9 F (36.6 C) (11/27 0938) Temp Source: Oral (11/27 0938) BP: 107/58 (11/27 0938) Pulse Rate: 100 (11/27 0938)  Labs: Recent Labs    04/19/21 0221 04/20/21 0228 04/21/21 0150 04/21/21 1127  HGB 9.8* 11.2* 11.6*  --   HCT 32.3* 36.9 37.9  --   PLT 215 261 254  --   HEPARINUNFRC 0.69 0.63 0.74* 0.96*    Estimated Creatinine Clearance: 42.9 mL/min (by C-G formula based on SCr of 0.7 mg/dL).   Medications:  Scheduled:   cyclobenzaprine  5 mg Oral TID   diltiazem  240 mg Oral Daily   docusate sodium  100 mg Oral BID   dofetilide  125 mcg Oral BID   feeding supplement  1 Container Oral TID BM   FLUoxetine  40 mg Oral Daily   ipratropium-albuterol  3 mL Nebulization BID   levothyroxine  75 mcg Oral Daily    lidocaine  1 patch Transdermal Q24H   lidocaine  1 patch Transdermal Q24H   mouth rinse  15 mL Mouth Rinse BID   methylPREDNISolone (SOLU-MEDROL) injection  40 mg Intravenous Q24H   multivitamin with minerals  1 tablet Oral Daily   nystatin  5 mL Oral QID   pantoprazole  40 mg Oral Daily   rosuvastatin  40 mg Oral Daily   senna  2 tablet Oral BID   sodium chloride flush  3 mL Intravenous Q12H   Infusions:   cefTRIAXone (ROCEPHIN)  IV 1 g (04/20/21 1428)   heparin 1,500 Units/hr (04/21/21 0326)    Assessment: 80 yo F found to have PE on CT scan. Patient on Eliquis PTA for afib - (last dose 11/15 PM). Heparin initiated but switched to enoxaparin 11/17 given PE treatment in active cancer patient. Decision made to switch back to heparin in preparation for biopsy. Biopsy done 11/21, patient had hemoptysis and heparin was held.    Heparin resumed 11/22 AM. Pharmacy consulted to dose heparin.   Heparin level today remains supratherapeutic at 0.96, on 1500 units/hr. Hgb 11.6, plt 254. No lines issues or signs/symptoms of bleeding noted per RN.   Goal of Therapy:  Heparin level 0.3-0.7 units/ml Monitor platelets by anticoagulation protocol: Yes   Plan:  Hold heparin infusion for ~30 minutes, then decrease heparin drip  rate to 1400 units/hr.  Check ~8hr heparin level.  Daily CBC, heparin level. Monitor for signs/symptoms of bleeding.   Vance Peper, PharmD PGY1 Pharmacy Resident Phone [phone] 04/21/2021 12:16 PM   Please check AMION for all [place] phone numbers After 10:00 PM, call [place] [phone]

## 2021-04-21 NOTE — Progress Notes (Signed)
ANTICOAGULATION CONSULT NOTE - Follow Up Consult  Pharmacy Consult for Heparin Indication: pulmonary embolus  Allergies  Allergen Reactions   Erythromycin Other (See Comments)    Made the patient's tongue "burn"   Tape Rash and Other (See Comments)    PLEASE USE PAPER TAPE!!   Meperidine Nausea And Vomiting   Shellfish Allergy Nausea And Vomiting   Ciprofloxacin Rash and Other (See Comments)    Rash from IV   Codeine Nausea And Vomiting   Latex Rash and Other (See Comments)    Made the patient's skin "burn," also   Penicillins Rash and Other (See Comments)    Has patient had a PCN reaction causing immediate rash, facial/tongue/throat swelling, SOB or lightheadedness with hypotension: Unk Has patient had a PCN reaction causing severe rash involving mucus membranes or skin necrosis: No Has patient had a PCN reaction that required hospitalization: No Has patient had a PCN reaction occurring within the last 10 years: No If all of the above answers are "NO", then may proceed with Cephalosporin use.     Patient Measurements: Height: 5\' 2"  (157.5 cm) Weight: 56.2 kg (124 lb) IBW/kg (Calculated) : 50.1 Heparin Dosing Weight: 56.7 kg  Vital Signs: Temp: 97.8 F (36.6 C) (11/27 1956) Temp Source: Oral (11/27 1956) BP: 96/69 (11/27 1956) Pulse Rate: 64 (11/27 2100)  Labs: Recent Labs    04/19/21 0221 04/20/21 0228 04/21/21 0150 04/21/21 1127 04/21/21 2104  HGB 9.8* 11.2* 11.6*  --   --   HCT 32.3* 36.9 37.9  --   --   PLT 215 261 254  --   --   HEPARINUNFRC 0.69 0.63 0.74* 0.96* 0.72*  CREATININE  --   --  0.80  --   --      Estimated Creatinine Clearance: 42.9 mL/min (by C-G formula based on SCr of 0.8 mg/dL).   Medications:  Scheduled:   cyclobenzaprine  5 mg Oral TID   diltiazem  240 mg Oral Daily   docusate sodium  100 mg Oral BID   dofetilide  125 mcg Oral BID   feeding supplement  1 Container Oral TID BM   FLUoxetine  40 mg Oral Daily    ipratropium-albuterol  3 mL Nebulization BID   levothyroxine  75 mcg Oral Daily   lidocaine  1 patch Transdermal Q24H   lidocaine  1 patch Transdermal Q24H   mouth rinse  15 mL Mouth Rinse BID   methylPREDNISolone (SOLU-MEDROL) injection  40 mg Intravenous Q24H   multivitamin with minerals  1 tablet Oral Daily   nystatin  5 mL Oral QID   pantoprazole  40 mg Oral Daily   rosuvastatin  40 mg Oral Daily   senna  2 tablet Oral BID   sodium chloride flush  3 mL Intravenous Q12H   Infusions:   cefTRIAXone (ROCEPHIN)  IV 1 g (04/21/21 1501)   heparin 1,400 Units/hr (04/21/21 1259)    Assessment: 80 yo F found to have PE on CT scan. Patient on Eliquis PTA for afib - (last dose 11/15 PM). Heparin initiated but switched to enoxaparin 11/17 given PE treatment in active cancer patient. Decision made to switch back to heparin in preparation for biopsy. Biopsy done 11/21, patient had hemoptysis and heparin was held.    Heparin resumed 11/22 AM. Pharmacy consulted to dose heparin.   Heparin level today remains at the top of therapeutic goal at 0.7, on 1400 units/hr. Hgb 11.6, plt 254. No lines issues or signs/symptoms of bleeding  noted per RN.   Goal of Therapy:  Heparin level 0.3-0.7 units/ml Monitor platelets by anticoagulation protocol: Yes   Plan:  Decrease heparin infusion rate to 1200 units/hr.  Daily CBC, heparin level. Monitor for signs/symptoms of bleeding.   Bonnita Nasuti Pharm.D. CPP, BCPS Clinical Pharmacist [phone] 04/21/2021 10:14 PM   Please check AMION for all [place] phone numbers After 10:00 PM, call [place] [phone]

## 2021-04-21 NOTE — Progress Notes (Signed)
PROGRESS NOTE   Andrea [name]  KWI:[id number]    DOB: [birth year]    DOA: 04/10/2021  PCP: Mosie Lukes, MD   I have briefly reviewed patients previous medical records in [facility].  Chief complaint: Dyspnea   Brief Narrative:  80 year old female, medical history significant for COPD, OSA on CPAP, CAD s/p CABG, hypertension, PAD, former smoker, history of PE in the past after her CABG, A. fib, chronic diastolic CHF, hypothyroid, recently saw pulmonology/Dr. Lamonte Sakai on 04/02/2021 for a left upper lobe mass and lytic lesion on the posterior right ninth rib identified on CT chest 03/07/2021, significantly hypermetabolic on PET 24/26/8341, planned outpatient biopsy on 04/15/2021, now admitted 04/10/2021 with complaints of acute onset of severe right-sided rib cage pain, worse with coughing and dyspnea.  CT chest showed left upper lobe with bland thrombus versus tumor thrombus, large area of consolidation in the left upper lobe with lymphangitic spread, right posterior ninth rib lesion with pathological fracture.  Admitted for large left upper lobe mass, biopsy now confirms adenocarcinoma/non-small cell lung cancer with acute pulmonary embolism/possible tumor thrombus, pathological ninth rib fracture, postobstructive pneumonia, possible COPD exacerbation and acute on chronic hypoxic respiratory failure. Hospital course complicated by progressive acute respiratory failure with hypoxia requiring HFNC.  PCCM consulted and s/p thoracentesis 11/25 yielding 400 mL with little improvement in dyspnea or hypoxia.  Palliative care consulted for goals of care and will need to follow-up.  Oncology consulted.   Assessment & Plan:  Principal Problem:   Pulmonary embolism (HCC) Active Problems:   CAD (coronary artery disease)   Hypothyroidism   Chronic respiratory failure with hypoxia (HCC)   Atrial fibrillation, chronic (HCC)   COPD (chronic obstructive pulmonary disease) (HCC)   Chronic diastolic CHF  (congestive heart failure) (HCC)   Lung mass   Pathological fracture of one rib   Microcytic anemia   Malnutrition of moderate degree   Large left upper lobe mass, non-small cell lung cancer/adenocarcinoma, complicated by right posterior ninth rib fracture with chest wall pain, lymphangitic spread to LUL, hemoptysis (resolved), postobstructive pneumonia and pleural effusion: - Was supposed to have outpatient evaluation including biopsy but had to be hospitalized - CT-guided needle biopsy by IR on 04/15/2021.  Pathology confirms adenocarcinoma.   -Oncology input appreciated.  Metastatic disease, lung cancer not curable.  Given high O2 demands, poor performance status, not candidate for systemic treatment.  Recommended comfort based approach and referral to hospice but patient wishes to think about it.  I discussed this in detail with patient and son on 11/27 and reiterated that she would not be a candidate for any cancer chemotherapy in her current situation and discussed option of comfort care. Palliative care team to continue to work with patient on this. - Palliative care follow-up appreciated 11/26.  Is DNR/DNI.  I feel she is appropriate for hospice. - Chest x-ray 11/23: Complete whiteout of left lung fields likely multifactorial-large lung mass, pleural effusion, consolidation or collapse.   - PCCM following.  S/p left thoracentesis 11/25 yielding only 400 mL with little relief of her dyspnea or hypoxia. Will consider Pleurx catheter if effusion recurs.  Left upper lobe PE (bland thrombus versus tumor thrombus): - Continue IV heparin anticoagulation for now. - 2D echo showed LVEF of 50-55%, moderate hypokinesis of left ventricle based mid inferior wall and inferior septal wall.  Suspected left upper lobe postobstructive pneumonia: - Continue empiric IV antibiotics.  Day 8 of IV ceftriaxone today.  Will await PCCM input in a.m.  to determine duration.  This could be discontinued now or  complete 10 days given obstructive nature.  1 cm right upper lobe subpleural nodule: - Outpatient follow-up.  May be part of the above malignant process.  COPD exacerbation: - Emphysema noted on CTA chest - Continue IV Solu-Medrol, IV antibiotics, inhaled steroids.  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 - Currently on HFNC 20 L, 100% FiO2 and oxygen saturation in the low 90s. - No significant improvement in dyspnea or hypoxia after thoracentesis.  Microcytic anemia - May be related to anemia of chronic disease or iron deficiency anemia - Stable.  Paroxysmal A. fib: - Continue dofetilide and diltiazem for rate control.  Issues with hypotension 11/23.  Hypotension has resolved. - Continue IV heparin for now.  Hypotension - Discontinue Imdur.  Resolved for now.  Chronic diastolic CHF - Clinically euvolemic.  CAD - Nitrates have been held due to hypotension.  May consider resuming at reduced dose  Hyperlipidemia - Continue statins.  Oral thrush - Continue nystatin swish and swallow.  Improved.  Depression - Continue fluoxetine  Hypothyroid - Continue levothyroxine  Body mass index is 22.68 kg/m.  Nutritional Status Nutrition Problem: Moderate Malnutrition Etiology: chronic illness (COPD, CHF) Signs/Symptoms: moderate fat depletion, severe muscle depletion Interventions: Boost Breeze, MVI  Pressure Ulcer:     DVT prophylaxis:   Currently on IV heparin drip   Code Status: DNR Family Communication: I discussed with son on 11/27 via phone, updated care including cancer diagnosis which she was not aware of (patient had consented). Disposition:  Status is: Inpatient  Remains inpatient appropriate because: Acute respiratory failure with hypoxia needing HFNC.  Medically unstable.        Consultants:    Interventional radiology PCCM Medical oncology Palliative care medicine  Procedures:   CT-guided needle biopsy of lung mass by interventional radiology on 11/21  Antimicrobials:   IV ceftriaxone 11/20 >   Subjective:  Reports minimal if any improvement in her dyspnea after thoracentesis.  Ongoing decreased appetite.  Back pain is better controlled but still present.  Objective:   Vitals:   04/21/21 0453 04/21/21 0900 04/21/21 0938 04/21/21 1344  BP: 112/61  (!) 107/58 (!) 103/50  Pulse: 74  100 95  Resp: 18  18 18   Temp: 97.8 F (36.6 C)  97.9 F (36.6 C) 97.6 F (36.4 C)  TempSrc: Oral  Oral Oral  SpO2: 92% 91% 90% 90%  Weight:      Height:        General exam: Elderly female, moderately built, frail, chronically ill looking and cachectic, lying propped up in bed and in no distress.  Bitemporal wasting.  Oral thrush improved. Respiratory system: Reduced breath sounds left lung fields.  No significant improvement post thoracentesis.  Harsh breath sounds right lung fields.  No wheezing or crackles.  No increased work of breathing.  Not much change since yesterday. Cardiovascular system: S1 and S2 heard, RRR.  No JVD or pedal edema.  Telemetry personally reviewed: Sinus rhythm with PACs and PVCs. Gastrointestinal system: Abdomen is nondistended, soft and nontender. No organomegaly or masses felt. Normal bowel sounds heard. Central nervous system: Alert and oriented. No focal neurological deficits. Extremities: Symmetric 5 x 5 power. Skin: No rashes, lesions or ulcers Psychiatry: Judgement and insight appear normal. Mood & affect appropriate.     Data Reviewed:   I have personally  reviewed following labs and imaging studies   CBC: Recent Labs  Lab 04/15/21 0039 04/16/21 0147 04/19/21 0221 04/20/21 0228 04/21/21 0150  WBC 8.7   < > 12.8* 15.4* 12.2*  NEUTROABS 8.4*  --   --   --   --   HGB 9.0*   < > 9.8* 11.2* 11.6*  HCT 29.9*   < > 32.3* 36.9 37.9  MCV 75.3*    < > 76.4* 75.6* 75.0*  PLT 231   < > 215 261 254   < > = values in this interval not displayed.    Basic Metabolic Panel: Recent Labs  Lab 04/15/21 0039 04/16/21 0147 04/17/21 0053 04/18/21 0132 04/21/21 0150  NA 131* 136 134* 134* 137  K 4.0 3.8 4.2 4.1 4.0  CL 93* 97* 98 97* 97*  CO2 30 29 27 29 28   GLUCOSE 165* 153* 117* 140* 116*  BUN 10 14 15 13 17   CREATININE 0.74 0.84 0.84 0.70 0.80  CALCIUM 8.5* 8.7* 8.6* 8.4* 8.7*    Liver Function Tests: No results for input(s): AST, ALT, ALKPHOS, BILITOT, PROT, ALBUMIN in the last 168 hours.  CBG: No results for input(s): GLUCAP in the last 168 hours.  Microbiology Studies:   Recent Results (from the past 240 hour(s))  Body fluid culture w Gram Stain     Status: None (Preliminary result)   Collection Time: 04/19/21  1:38 PM   Specimen: Pleural Fluid  Result Value Ref Range Status   Specimen Description PLEURAL FLUID  Final   Special Requests PLEURAL  Final   Gram Stain   Final    MODERATE WBC PRESENT, PREDOMINANTLY PMN NO ORGANISMS SEEN    Culture   Final    NO GROWTH 2 DAYS Performed at [facility], [place] [address] [place], [place] [address]    Report Status PENDING  Incomplete    Radiology Studies:  No results found.  Scheduled Meds:    cyclobenzaprine  5 mg Oral TID   diltiazem  240 mg Oral Daily   docusate sodium  100 mg Oral BID   dofetilide  125 mcg Oral BID   feeding supplement  1 Container Oral TID BM   FLUoxetine  40 mg Oral Daily   ipratropium-albuterol  3 mL Nebulization BID   levothyroxine  75 mcg Oral Daily   lidocaine  1 patch Transdermal Q24H   lidocaine  1 patch Transdermal Q24H   mouth rinse  15 mL Mouth Rinse BID   methylPREDNISolone (SOLU-MEDROL) injection  40 mg Intravenous Q24H   multivitamin with minerals  1 tablet Oral Daily   nystatin  5 mL Oral QID   pantoprazole  40 mg Oral Daily   rosuvastatin  40 mg Oral Daily   senna  2 tablet Oral BID   sodium chloride flush  3 mL  Intravenous Q12H    Continuous Infusions:    cefTRIAXone (ROCEPHIN)  IV 1 g (04/20/21 1428)   heparin 1,400 Units/hr (04/21/21 1259)     LOS: 9 days     Vernell Leep, MD,  FACP, [facility], [facility], [facility] (Care Management Physician Certified) [place]  To contact the attending provider between 7A-7P or the covering provider during after hours 7P-7A, please log into the web site [email or web address] and access using universal [place] password for that web site. If you do not have the password, please call the hospital operator.  04/21/2021, 2:43 PM

## 2021-04-21 NOTE — Progress Notes (Signed)
   Palliative Medicine Inpatient Follow Up Note   HPI: per initial PM consult by Andrea [name] "80 y.o. female  with past medical history of  COPD, OSA on CPAP, CAD s/p CABG, hypertension, PAD, former smoker, history of PE in the past after her CABG, A. fib, chronic diastolic CHF, and hypothyroidism admitted on 04/10/2021 with acute onset of severe right-sided rib cage pain. Patient had recently seen pulmonlolgy on 11/8 for LUL mass and lytic lesion on R 9th rib. CT chest showed left upper lobe with bland thrombus versus tumor thrombus, large area of consolidation in the left upper lobe with lymphangitic spread, right posterior ninth rib lesion with pathological fracture.  Admitted for large left upper lobe mass, suspecting primary lung cancer with acute pulmonary embolism/possible tumor thrombus, pathological ninth rib fracture, postobstructive pneumonia, possible COPD exacerbation and acute on chronic hypoxic respiratory failure. Hospitalization has been complicated by worsening respiratory failure requiring HFNC. Patient underwent CT-guided needle biopsy by IR on 04/15/2021."  Bx confirmed adenocarcinoma. Oncology has consulted as well. She is not a candidate for system treatment.    Today's Discussion (symptoms): I met today with Andrea [name]. She is tearful in discussing how quickly she became ill  and that her cancer is metastatic and incurable. She is not eating or drinking much. She states her breathing is the same. She is currently on 20 lpm oxygen. She says she may be ready to discuss comfort care in a couple of days. She is open to hospice just not yet. Her husband died in hospice care 12-16-2019 and she is familiar with the services that can be provided. She is aware that we will continue to check in with her and can help her with this transition when she is ready.  Discussed the importance of continued conversation with family and their  medical providers regarding overall plan of care and  treatment options, ensuring decisions are within the context of the patients values and GOCs.  Questions and concerns addressed   Objective Assessment: Vital Signs Vitals:   04/21/21 1344 04/21/21 1500  BP: (!) 103/50   Pulse: 95 90  Resp: 18 18  Temp: 97.6 F (36.4 C)   SpO2: 90% 94%    Intake/Output Summary (Last 24 hours) at 04/21/2021 1755 Last data filed at 04/21/2021 1000 Gross per 24 hour  Intake 1320.93 ml  Output 475 ml  Net 845.93 ml   Last Weight  Most recent update: 04/19/2021  5:24 AM    Weight  56.2 kg (124 lb)             SUMMARY OF RECOMMENDATIONS    Code Status: DNR  Continue current full scope of treatment until she makes decision for transition to comfort care/hospice.   Prognosis: Poor with metastatic lung cancer without treatment options, high oxygen demand, poor performance status.  Candidate for hospice.   Time In: 17:05 Time Out: 17:20 Time Spent: ** Greater than 50% of the time was spent in counseling and coordination of care ______________________________________________________________________________________ Lindell Spar, NP [place] Team Team Cell Phone: [phone] Please utilize secure chat with additional questions, if there is no response within 30 minutes please call the above phone number  Palliative Medicine Team providers are available by phone from 7am to 7pm daily and can be reached through the team cell phone.  Should this patient require assistance outside of these hours, please call the patient's attending physician.

## 2021-04-21 NOTE — Progress Notes (Signed)
ANTICOAGULATION CONSULT NOTE - Follow Up Consult  Pharmacy Consult for Heparin Indication: pulmonary embolus  Allergies  Allergen Reactions   Erythromycin Other (See Comments)    Made the patient's tongue "burn"   Tape Rash and Other (See Comments)    PLEASE USE PAPER TAPE!!   Meperidine Nausea And Vomiting   Shellfish Allergy Nausea And Vomiting   Ciprofloxacin Rash and Other (See Comments)    Rash from IV   Codeine Nausea And Vomiting   Latex Rash and Other (See Comments)    Made the patient's skin "burn," also   Penicillins Rash and Other (See Comments)    Has patient had a PCN reaction causing immediate rash, facial/tongue/throat swelling, SOB or lightheadedness with hypotension: Unk Has patient had a PCN reaction causing severe rash involving mucus membranes or skin necrosis: No Has patient had a PCN reaction that required hospitalization: No Has patient had a PCN reaction occurring within the last 10 years: No If all of the above answers are "NO", then may proceed with Cephalosporin use.     Patient Measurements: Height: 5\' 2"  (157.5 cm) Weight: 56.2 kg (124 lb) IBW/kg (Calculated) : 50.1 Heparin Dosing Weight: 56.7 kg  Vital Signs: Temp: 98 [name] (36.7 C) (11/27 0024) Temp Source: Oral (11/27 0024) BP: 125/70 (11/27 0024) Pulse Rate: 95 (11/27 0200)  Labs: Recent Labs    11/Andrea/22 0221 04/20/21 0228 04/21/21 0150  HGB 9.8* 11.2* 11.6*  HCT 32.3* 36.9 37.9  PLT 215 261 254  HEPARINUNFRC 0.69 0.63 0.74*     Estimated Creatinine Clearance: 42.9 mL/min (by C-G formula based on SCr of 0.7 mg/dL).   Assessment: 80 yo [name] found to have PE on CT scan. Patient on Eliquis PTA for afib - (last dose 11/15 PM). Heparin initiated but switched to enoxaparin 11/17 given PE treatment in active cancer patient. Decision made to switch back to heparin in preparation for biopsy. Biopsy done 11/21, patient had hemoptysis and heparin was held. Heparin resumed 11/22 AM.  Heparin  level slightly supratherapeutic (0.74) on gtt at 1550 units/hr. No issues with line or bleeding reported per RN.  Goal of Therapy:  Heparin level 0.3-0.7 units/ml Monitor platelets by anticoagulation protocol: Yes   Plan:  Decrease heparin to 1500 units/hr. [name]/u 8 hr heparin level  Sherlon Handing, PharmD, BCPS Please see amion for complete clinical pharmacist phone list 04/21/2021 3:22 AM

## 2021-04-22 ENCOUNTER — Other Ambulatory Visit: Payer: Self-pay | Admitting: Cardiovascular Disease

## 2021-04-22 DIAGNOSIS — I25708 Atherosclerosis of coronary artery bypass graft(s), unspecified, with other forms of angina pectoris: Secondary | ICD-10-CM | POA: Diagnosis not present

## 2021-04-22 DIAGNOSIS — I482 Chronic atrial fibrillation, unspecified: Secondary | ICD-10-CM | POA: Diagnosis not present

## 2021-04-22 DIAGNOSIS — I5032 Chronic diastolic (congestive) heart failure: Secondary | ICD-10-CM | POA: Diagnosis not present

## 2021-04-22 DIAGNOSIS — I2699 Other pulmonary embolism without acute cor pulmonale: Secondary | ICD-10-CM | POA: Diagnosis not present

## 2021-04-22 LAB — BASIC METABOLIC PANEL
Anion gap: 8 (ref 5–15)
BUN: 20 mg/dL (ref 8–23)
CO2: 28 mmol/L (ref 22–32)
Calcium: 8.5 mg/dL — ABNORMAL LOW (ref 8.9–10.3)
Chloride: 99 mmol/L (ref 98–111)
Creatinine, Ser: 0.79 mg/dL (ref 0.44–1.00)
GFR, Estimated: >60 mL/min (ref >60)
Glucose, Bld: 141 mg/dL — ABNORMAL HIGH (ref 70–99)
Potassium: 4 mmol/L (ref 3.5–5.1)
Sodium: 135 mmol/L (ref 135–145)

## 2021-04-22 LAB — CBC
HCT: 35.1 % — ABNORMAL LOW (ref 36.0–46.0)
Hemoglobin: 10.6 g/dL — ABNORMAL LOW (ref 12.0–15.0)
MCH: 22.7 pg — ABNORMAL LOW (ref 26.0–34.0)
MCHC: 30.2 g/dL (ref 30.0–36.0)
MCV: 75.2 fL — ABNORMAL LOW (ref 80.0–100.0)
Platelets: 203 10*3/uL (ref 150–400)
RBC: 4.67 MIL/uL (ref 3.87–5.11)
RDW: 19.8 % — ABNORMAL HIGH (ref 11.5–15.5)
WBC: 11 10*3/uL — ABNORMAL HIGH (ref 4.0–10.5)
nRBC: 0 % (ref 0.0–0.2)

## 2021-04-22 LAB — BODY FLUID CULTURE W GRAM STAIN: Culture: NO GROWTH

## 2021-04-22 LAB — HEPARIN LEVEL (UNFRACTIONATED): Heparin Unfractionated: 0.54 IU/mL (ref 0.30–0.70)

## 2021-04-22 MED ORDER — HEPARIN (PORCINE) IN NACL 1000-0.9 UT/500ML-% IV SOLN
INTRAVENOUS | Status: AC
  Filled 2021-04-22: qty 500

## 2021-04-22 MED ORDER — HEPARIN SODIUM (PORCINE) 1000 UNIT/ML IJ SOLN
INTRAMUSCULAR | Status: AC
  Filled 2021-04-22: qty 10

## 2021-04-22 MED ORDER — LIDOCAINE HCL (PF) 1 % IJ SOLN
INTRAMUSCULAR | Status: AC
  Filled 2021-04-22: qty 30

## 2021-04-22 MED ORDER — VERAPAMIL HCL 2.5 MG/ML IV SOLN
INTRAVENOUS | Status: AC
  Filled 2021-04-22: qty 2

## 2021-04-22 NOTE — Progress Notes (Signed)
Physical Therapy Treatment Patient Details Name: Andrea [name] MRN: [id number] DOB: 1941-01-17 Today's Date: 04/22/2021   History of Present Illness 80 y.o. female presents to [facility] hospital on 04/10/2021 with R sided chest pain. Pt found to have LUL mass as well as lytic lesion on posterior 9th rib. PT also found to have tumor thrombus, started on anticoagulation. Plan for lung biopsy 11/21. PMH includes COPD, with chronic hypoxemic respiratory failure, OSA, atrial fibrillation, CAD, chronic diastolic heart failure and hypothyroidism.    PT Comments    Pt asleep on entry with son in room. Pt easily roused, agreeable to therapy, however is confused about day, and upset about "losing time" in hospital. Otherwise, pt cognitively [facility]. Pt limited in mobility by O2 desaturation to 88%O2 on 15 L HHFNC, and BP 106/58, in presence of decreased strength and endurance. Pt is currently supervision for bed mobility, min guard for transfers and min A for ambulation with RW (distance limited by HHF O2). D/c plans remain appropriate. PT will continue to follow acutely.   Recommendations for follow up therapy are one component of a multi-disciplinary discharge planning process, led by the attending physician.  Recommendations may be updated based on patient status, additional functional criteria and insurance authorization.  Follow Up Recommendations  Home health PT (PT recommends HHPT however pt declining at this time)     Assistance Recommended at Discharge Intermittent Supervision/Assistance  Equipment Recommendations  Rollator (4 wheels)       Precautions / Restrictions Precautions Precautions: Fall Precaution Comments: monitor SpO2 Restrictions Weight Bearing Restrictions: No     Mobility  Bed Mobility Overal bed mobility: Needs Assistance Bed Mobility: Rolling;Sidelying to Sit Rolling: Supervision Sidelying to sit: Supervision;HOB elevated       General bed mobility comments: increased  time and use of rails    Transfers Overall transfer level: Needs assistance Equipment used: None Transfers: Sit to/from Stand Sit to Stand: Min guard           General transfer comment: min guard for safety, vc for hand placement, good power up and self steadying    Ambulation/Gait Ambulation/Gait assistance: Min assist Gait Distance (Feet): 3 Feet Assistive device: IV Pole;Rolling walker (2 wheels) Gait Pattern/deviations: Step-to pattern;Trunk flexed Gait velocity: reduced Gait velocity interpretation: <1.31 ft/sec, indicative of household ambulator   General Gait Details: min A for mangement of lines, no physical assist needed, vc for sequencing steps to recliner, limited in mobility by HHFNC lines and overall decreased endurance          Balance Overall balance assessment: Needs assistance Sitting-balance support: No upper extremity supported;Feet supported Sitting balance-Leahy Scale: Good     Standing balance support: Reliant on assistive device for balance Standing balance-Leahy Scale: Poor Standing balance comment: requires UE on RW                            Cognition Arousal/Alertness: Awake/alert Behavior During Therapy: WFL for tasks assessed/performed Overall Cognitive Status: Impaired/Different from baseline Area of Impairment: Orientation;Memory                 Orientation Level: Disoriented to;Time   Memory: Decreased short-term memory         General Comments: Pt disoriented to time thinks it is Saturday instead of Monday. Pt notes frustration with losing time.           General Comments General comments (skin integrity, edema, etc.): pt on 15 L O2 via  HHFNC, SpO2 96%O2 at rest drops to 88%SpO2 with mobilizing over to chair. rebounds to 90%O2 with sitting. supine BP 116/69, c/o of transient lightheadedness, seated EoB 106/58.      Pertinent Vitals/Pain Pain Assessment: No/denies pain     PT Goals (current goals  can now be found in the care plan section) Acute Rehab PT Goals Patient Stated Goal: to go home and reduce pain PT Goal Formulation: With patient Time For Goal Achievement: 04/26/21 Potential to Achieve Goals: Good Progress towards PT goals: Progressing toward goals    Frequency    Min 3X/week      PT Plan Current plan remains appropriate       AM-PAC PT "6 Clicks" Mobility   Outcome Measure  Help needed turning from your back to your side while in a flat bed without using bedrails?: A Little Help needed moving from lying on your back to sitting on the side of a flat bed without using bedrails?: A Little Help needed moving to and from a bed to a chair (including a wheelchair)?: A Little Help needed standing up from a chair using your arms (e.g., wheelchair or bedside chair)?: A Little Help needed to walk in hospital room?: A Little Help needed climbing 3-5 steps with a railing? : A Lot 6 Click Score: 17    End of Session Equipment Utilized During Treatment: Oxygen Activity Tolerance: Patient limited by fatigue Patient left: in chair;with call bell/phone within reach;with chair alarm set Nurse Communication: Mobility status PT Visit Diagnosis: Other abnormalities of gait and mobility (R26.89);Muscle weakness (generalized) (M62.81)     Time: 1594-5859 PT Time Calculation (min) (ACUTE ONLY): 29 min  Charges:  $Therapeutic Activity: 23-37 mins                     Mathieu Schloemer B. Migdalia Dk PT, DPT Acute Rehabilitation Services Pager [phone] Office [phone]    [place] 04/22/2021, 11:38 AM

## 2021-04-22 NOTE — Telephone Encounter (Signed)
Patient is currently admitted as of 11/16.  Will check back to refill

## 2021-04-22 NOTE — Progress Notes (Signed)
PROGRESS NOTE  Andrea [name]  WCB:[id number] DOB: Jan 28, 1941 DOA: 04/10/2021 PCP: Mosie Lukes, MD   Brief Narrative: 80 year old female, medical history significant for COPD, OSA on CPAP, CAD s/p CABG, hypertension, PAD, former smoker, history of PE in the past after her CABG, A. fib, chronic diastolic CHF, hypothyroid, recently saw pulmonology/Dr. Lamonte Sakai on 04/02/2021 for a left upper lobe mass and lytic lesion on the posterior right ninth rib identified on CT chest 03/07/2021, significantly hypermetabolic on PET 61/60/7371, planned outpatient biopsy on 04/15/2021, now admitted 04/10/2021 with complaints of acute onset of severe right-sided rib cage pain, worse with coughing and dyspnea.  CT chest showed left upper lobe with bland thrombus versus tumor thrombus, large area of consolidation in the left upper lobe with lymphangitic spread, right posterior ninth rib lesion with pathological fracture.  Admitted for large left upper lobe mass, biopsy now confirms adenocarcinoma/non-small cell lung cancer with acute pulmonary embolism/possible tumor thrombus, pathological ninth rib fracture, postobstructive pneumonia, possible COPD exacerbation and acute on chronic hypoxic respiratory failure. Hospital course complicated by progressive acute respiratory failure with hypoxia requiring HFNC.  PCCM consulted and s/p thoracentesis 11/25 yielding 400 mL with little improvement in dyspnea or hypoxia.  Palliative care consulted for goals of care and will need to follow-up.  Oncology consulted.  Assessment & Plan: Principal Problem:   Pulmonary embolism (HCC) Active Problems:   CAD (coronary artery disease)   Hypothyroidism   Chronic respiratory failure with hypoxia (HCC)   Atrial fibrillation, chronic (HCC)   COPD (chronic obstructive pulmonary disease) (HCC)   Chronic diastolic CHF (congestive heart failure) (HCC)   Lung mass   Pathological fracture of one rib   Microcytic anemia   Malnutrition of  moderate degree  Acute respiratory failure with hypoxia: Multifactorial inclusive of large left upper lobe NSCLC with lymphangitic spread, postobstructive pneumonia, pulmonary embolism, COPD exacerbation. Pleural effusion unlikely to be significant contributor as no improvement after thoracentesis.  - Continue HHFNC to maintain normal WOB and SpO2 >88%.   Non-small cell lung cancer/adenocarcinoma, complicated by right posterior ninth rib fracture with chest wall pain, lymphangitic spread to LUL, hemoptysis (resolved), postobstructive pneumonia and pleural effusion: - CT-guided needle biopsy by IR on 04/15/2021.  Pathology confirms adenocarcinoma.  - Oncology input appreciated.  Metastatic disease, lung cancer not curable.  Given high O2 demands, poor performance status, not candidate for systemic treatment.  Recommended comfort based approach and referral to hospice but patient wishes to think about it.  - Palliative care follow-up appreciated. Will revisit goals of care with patient after a couple more days of aggressive treatment.  - PCCM following.  S/p left thoracentesis 11/25 yielding only 400 mL with little relief of her dyspnea or hypoxia. Will consider Pleurx catheter if effusion recurs.   Left upper lobe PE (bland thrombus versus tumor thrombus): No RV strain on echo.  - Continue IV heparin for now.   Suspected left upper lobe postobstructive pneumonia: - Continue empiric ceftriaxone x10 days.  1 cm right upper lobe subpleural nodule: - Outpatient follow-up. May be part of the above malignant process.  COPD exacerbation: - Continue IV Solu-Medrol, IV antibiotics, inhaled steroids.   Microcytic anemia: May be related to anemia of chronic disease or iron deficiency anemia. Stable.  - Work up depending on goals of care discussions going forward.   PAF: NSR currently. - Continue dofetilide and diltiazem  - IV heparin as above  Chronic HFpEF: Euvolemic currently. 2D echo showed LVEF  of 50-55%, moderate hypokinesis of  left ventricle based mid inferior wall and inferior septal wall. - No changes to medications at this time.   CAD: No angina at this time.  - Holding nitrate due to hypotension.  - On statin.  Hyperlipidemia - Continue statin  Oral thrush: Improved. - Continue nystatin swish and swallow.    Depression - Continue fluoxetine  Hypothyroidism: Last TSH wnl - Continue synthroid.   Moderate protein-calorie malnutrition:  - MVM, supplement protein as able w/boost breeze  Generalized pain: Multifactorial.  - Lidoderm patches - Oxycodone prn. - Ok to give dilaudid prn in patient who may be approaching hospice.  Anxiety:  - Xanax prn, fluoxetine daily  DVT prophylaxis: Heparin IV Code Status: DNR Family Communication: Daughter and granddaughter at bedside Disposition Plan:  Status is: Inpatient  Remains inpatient appropriate because: Severe respiratory failure  Consultants:  Oncology PCCM IR Palliative care  Procedures:  CT-guided needle biopsy of lung mass by interventional radiology on 11/21  Antimicrobials: Ceftriaxone 11/20 >>    Subjective: Breathing essentially the same for many days. Short of breath with minimal exertion, no chest pain, per se. Getting bed > chair with assistance. Pain in multiple noncontiguous areas that comes and goes, not worse with anything in particular, better with pain medications.   Objective: Vitals:   04/22/21 0900 04/22/21 0921 04/22/21 1301 04/22/21 1531  BP:  (!) 154/94    Pulse:    69  Resp:  18  18  Temp:  97.9 F (36.6 C)    TempSrc:  Oral    SpO2: 94% 90% 90% 91%  Weight:      Height:        Intake/Output Summary (Last 24 hours) at 04/22/2021 1537 Last data filed at 04/22/2021 1000 Gross per 24 hour  Intake 543.63 ml  Output --  Net 543.63 ml   Filed Weights   04/18/21 0413 04/19/21 0507 04/22/21 0600  Weight: 56.4 kg 56.2 kg 55.1 kg    Gen: Chronically ill-appearing and  acutely ill-appearing elderly female in no distress Pulm: Non-labored but tachypneic at rest with crackles R > L diffusely. No wheezes.  CV: Regular rate and rhythm. No murmur, rub, or gallop. No JVD, no pitting pedal edema. GI: Abdomen soft, non-tender, non-distended, with normoactive bowel sounds. No organomegaly or masses felt. Ext: Warm, no deformities Skin: No rashes, lesions or ulcers on visualized skin Neuro: Alert and oriented. No focal neurological deficits. Psych: Judgement and insight appear normal. Mood & affect appropriate.   Data Reviewed: I have personally reviewed following labs and imaging studies  CBC: Recent Labs  Lab 04/18/21 0132 04/19/21 0221 04/20/21 0228 04/21/21 0150 04/22/21 0203  WBC 10.7* 12.8* 15.4* 12.2* 11.0*  HGB 9.4* 9.8* 11.2* 11.6* 10.6*  HCT 31.4* 32.3* 36.9 37.9 35.1*  MCV 76.2* 76.4* 75.6* 75.0* 75.2*  PLT 216 215 261 254 299   Basic Metabolic Panel: Recent Labs  Lab 04/16/21 0147 04/17/21 0053 04/18/21 0132 04/21/21 0150 04/22/21 0203  NA 136 134* 134* 137 135  K 3.8 4.2 4.1 4.0 4.0  CL 97* 98 97* 97* 99  CO2 29 27 29 28 28   GLUCOSE 153* 117* 140* 116* 141*  BUN 14 15 13 17 20   CREATININE 0.84 0.84 0.70 0.80 0.79  CALCIUM 8.7* 8.6* 8.4* 8.7* 8.5*   GFR: Estimated Creatinine Clearance: 42.9 mL/min (by C-G formula based on SCr of 0.79 mg/dL). Liver Function Tests: No results for input(s): AST, ALT, ALKPHOS, BILITOT, PROT, ALBUMIN in the last 168 hours. No  results for input(s): LIPASE, AMYLASE in the last 168 hours. No results for input(s): AMMONIA in the last 168 hours. Coagulation Profile: No results for input(s): INR, PROTIME in the last 168 hours. Cardiac Enzymes: No results for input(s): CKTOTAL, CKMB, CKMBINDEX, TROPONINI in the last 168 hours. BNP (last 3 results) No results for input(s): PROBNP in the last 8760 hours. HbA1C: No results for input(s): HGBA1C in the last 72 hours. CBG: No results for input(s): GLUCAP in  the last 168 hours. Lipid Profile: No results for input(s): CHOL, HDL, LDLCALC, TRIG, CHOLHDL, LDLDIRECT in the last 72 hours. Thyroid Function Tests: No results for input(s): TSH, T4TOTAL, FREET4, T3FREE, THYROIDAB in the last 72 hours. Anemia Panel: No results for input(s): VITAMINB12, FOLATE, FERRITIN, TIBC, IRON, RETICCTPCT in the last 72 hours. Urine analysis:    Component Value Date/Time   COLORURINE YELLOW 06/28/2018 [place] 06/28/2018 1533   LABSPEC 1.020 06/28/2018 1533   PHURINE 5.5 06/28/2018 1533   GLUCOSEU NEGATIVE 06/28/2018 1533   HGBUR NEGATIVE 06/28/2018 1533   BILIRUBINUR NEGATIVE 06/28/2018 1533   BILIRUBINUR negative 03/27/2015 [place] 06/28/2018 1533   PROTEINUR negative 03/27/2015 1833   PROTEINUR NEGATIVE 07/15/2014 1548   UROBILINOGEN 0.2 06/28/2018 1533   NITRITE NEGATIVE 06/28/2018 1533   LEUKOCYTESUR NEGATIVE 06/28/2018 1533   Recent Results (from the past 240 hour(s))  Body fluid culture w Gram Stain     Status: None   Collection Time: 04/19/21  1:38 PM   Specimen: Pleural Fluid  Result Value Ref Range Status   Specimen Description PLEURAL FLUID  Final   Special Requests PLEURAL  Final   Gram Stain   Final    MODERATE WBC PRESENT, PREDOMINANTLY PMN NO ORGANISMS SEEN    Culture   Final    NO GROWTH 3 DAYS Performed at [facility], [place] [address], [place],  [address]    Report Status 04/22/2021 FINAL  Final      Radiology Studies: No results found.  Scheduled Meds:  cyclobenzaprine  5 mg Oral TID   diltiazem  240 mg Oral Daily   docusate sodium  100 mg Oral BID   dofetilide  125 mcg Oral BID   feeding supplement  1 Container Oral TID BM   FLUoxetine  40 mg Oral Daily   ipratropium-albuterol  3 mL Nebulization BID   levothyroxine  75 mcg Oral Daily   lidocaine  1 patch Transdermal Q24H   lidocaine  1 patch Transdermal Q24H   mouth rinse  15 mL Mouth Rinse BID   methylPREDNISolone  (SOLU-MEDROL) injection  40 mg Intravenous Q24H   multivitamin with minerals  1 tablet Oral Daily   nystatin  5 mL Oral QID   pantoprazole  40 mg Oral Daily   rosuvastatin  40 mg Oral Daily   senna  2 tablet Oral BID   sodium chloride flush  3 mL Intravenous Q12H   Continuous Infusions:  cefTRIAXone (ROCEPHIN)  IV 1 g (04/22/21 1321)   heparin 1,200 Units/hr (04/22/21 0026)     LOS: 10 days   Time spent: 25 minutes.  Patrecia Pour, MD [facility] [email or web address] 04/22/2021, 3:37 PM

## 2021-04-22 NOTE — Progress Notes (Signed)
ANTICOAGULATION CONSULT NOTE - Follow Up Consult  Pharmacy Consult for Heparin Indication: pulmonary embolus  Allergies  Allergen Reactions   Erythromycin Other (See Comments)    Made the patient's tongue "burn"   Tape Rash and Other (See Comments)    PLEASE USE PAPER TAPE!!   Meperidine Nausea And Vomiting   Shellfish Allergy Nausea And Vomiting   Ciprofloxacin Rash and Other (See Comments)    Rash from IV   Codeine Nausea And Vomiting   Latex Rash and Other (See Comments)    Made the patient's skin "burn," also   Penicillins Rash and Other (See Comments)    Has patient had a PCN reaction causing immediate rash, facial/tongue/throat swelling, SOB or lightheadedness with hypotension: Unk Has patient had a PCN reaction causing severe rash involving mucus membranes or skin necrosis: No Has patient had a PCN reaction that required hospitalization: No Has patient had a PCN reaction occurring within the last 10 years: No If all of the above answers are "NO", then may proceed with Cephalosporin use.     Patient Measurements: Height: 5\' 2"  (157.5 cm) Weight: 55.1 kg (121 lb 7.6 oz) IBW/kg (Calculated) : 50.1 Heparin Dosing Weight: 56.7 kg  Vital Signs: Temp: 97.9 F (36.6 C) (11/28 0515) Temp Source: Oral (11/28 0515) BP: 102/62 (11/28 0515) Pulse Rate: 84 (11/28 0428)  Labs: Recent Labs    04/20/21 0228 04/21/21 0150 04/21/21 1127 04/21/21 2104 04/22/21 0203  HGB 11.2* 11.6*  --   --  10.6*  HCT 36.9 37.9  --   --  35.1*  PLT 261 254  --   --  203  HEPARINUNFRC 0.63 0.74* 0.96* 0.72* 0.54  CREATININE  --  0.80  --   --  0.79     Estimated Creatinine Clearance: 42.9 mL/min (by C-G formula based on SCr of 0.79 mg/dL).   Medications:  Scheduled:   cyclobenzaprine  5 mg Oral TID   diltiazem  240 mg Oral Daily   docusate sodium  100 mg Oral BID   dofetilide  125 mcg Oral BID   feeding supplement  1 Container Oral TID BM   FLUoxetine  40 mg Oral Daily    ipratropium-albuterol  3 mL Nebulization BID   levothyroxine  75 mcg Oral Daily   lidocaine  1 patch Transdermal Q24H   lidocaine  1 patch Transdermal Q24H   mouth rinse  15 mL Mouth Rinse BID   methylPREDNISolone (SOLU-MEDROL) injection  40 mg Intravenous Q24H   multivitamin with minerals  1 tablet Oral Daily   nystatin  5 mL Oral QID   pantoprazole  40 mg Oral Daily   rosuvastatin  40 mg Oral Daily   senna  2 tablet Oral BID   sodium chloride flush  3 mL Intravenous Q12H   Infusions:   cefTRIAXone (ROCEPHIN)  IV 1 g (04/21/21 1501)   heparin 1,200 Units/hr (04/22/21 0026)    Assessment: 80 yo F found to have PE on CT scan. Patient on Eliquis PTA for afib - (last dose 11/15 PM). Heparin initiated but switched to enoxaparin 11/17 given PE treatment in active cancer patient. Decision made to switch back to heparin in preparation for biopsy. Biopsy done 11/21, patient had hemoptysis and heparin was held briefly, heparin resumed 11/22 AM. Pharmacy consulted to dose heparin.   Heparin level today is therapeutic, CBC is stable, ongoing discussions re: palliative care.  Goal of Therapy:  Heparin level 0.3-0.7 units/ml Monitor platelets by anticoagulation protocol: Yes  Plan:  Continue heparin 1200 units/h Daily heparin level and CBC Need to determine longterm [facility] plan  Arrie Senate, PharmD, BCPS, [facility] Clinical Pharmacist [phone] Please check AMION for all [place] numbers 04/22/2021

## 2021-04-22 NOTE — Care Management Important Message (Signed)
Important Message  Patient Details  Name: Andrea [name] MRN: [id number] Date of Birth: 09/22/1940   Medicare Important Message Given:  Yes     Shelda Altes 04/22/2021, 10:51 AM

## 2021-04-23 DIAGNOSIS — I482 Chronic atrial fibrillation, unspecified: Secondary | ICD-10-CM | POA: Diagnosis not present

## 2021-04-23 DIAGNOSIS — I2699 Other pulmonary embolism without acute cor pulmonale: Secondary | ICD-10-CM | POA: Diagnosis not present

## 2021-04-23 DIAGNOSIS — C3492 Malignant neoplasm of unspecified part of left bronchus or lung: Secondary | ICD-10-CM

## 2021-04-23 DIAGNOSIS — I25708 Atherosclerosis of coronary artery bypass graft(s), unspecified, with other forms of angina pectoris: Secondary | ICD-10-CM | POA: Diagnosis not present

## 2021-04-23 DIAGNOSIS — I5032 Chronic diastolic (congestive) heart failure: Secondary | ICD-10-CM | POA: Diagnosis not present

## 2021-04-23 DIAGNOSIS — J9601 Acute respiratory failure with hypoxia: Secondary | ICD-10-CM | POA: Diagnosis not present

## 2021-04-23 LAB — HEPARIN LEVEL (UNFRACTIONATED): Heparin Unfractionated: 0.56 IU/mL (ref 0.30–0.70)

## 2021-04-23 LAB — CBC
HCT: 33.6 % — ABNORMAL LOW (ref 36.0–46.0)
Hemoglobin: 10 g/dL — ABNORMAL LOW (ref 12.0–15.0)
MCH: 22.7 pg — ABNORMAL LOW (ref 26.0–34.0)
MCHC: 29.8 g/dL — ABNORMAL LOW (ref 30.0–36.0)
MCV: 76.2 fL — ABNORMAL LOW (ref 80.0–100.0)
Platelets: 170 10*3/uL (ref 150–400)
RBC: 4.41 MIL/uL (ref 3.87–5.11)
RDW: 19.7 % — ABNORMAL HIGH (ref 11.5–15.5)
WBC: 11.6 10*3/uL — ABNORMAL HIGH (ref 4.0–10.5)
nRBC: 0 % (ref 0.0–0.2)

## 2021-04-23 NOTE — Progress Notes (Signed)
ANTICOAGULATION CONSULT NOTE - Follow Up Consult  Pharmacy Consult for Heparin Indication: pulmonary embolus  Allergies  Allergen Reactions   Erythromycin Other (See Comments)    Made the patient's tongue "burn"   Tape Rash and Other (See Comments)    PLEASE USE PAPER TAPE!!   Meperidine Nausea And Vomiting   Shellfish Allergy Nausea And Vomiting   Ciprofloxacin Rash and Other (See Comments)    Rash from IV   Codeine Nausea And Vomiting   Latex Rash and Other (See Comments)    Made the patient's skin "burn," also   Penicillins Rash and Other (See Comments)    Has patient had a PCN reaction causing immediate rash, facial/tongue/throat swelling, SOB or lightheadedness with hypotension: Unk Has patient had a PCN reaction causing severe rash involving mucus membranes or skin necrosis: No Has patient had a PCN reaction that required hospitalization: No Has patient had a PCN reaction occurring within the last 10 years: No If all of the above answers are "NO", then may proceed with Cephalosporin use.     Patient Measurements: Height: 5\' 2"  (157.5 cm) Weight: 54.8 kg (120 lb 13 oz) IBW/kg (Calculated) : 50.1 Heparin Dosing Weight: 56.7 kg  Vital Signs: Temp: 97.9 F (36.6 C) (11/29 0714) Temp Source: Oral (11/29 0714) BP: 134/86 (11/29 0857) Pulse Rate: 85 (11/29 0857)  Labs: Recent Labs    04/21/21 0150 04/21/21 1127 04/21/21 2104 04/22/21 0203 04/23/21 0041  HGB 11.6*  --   --  10.6* 10.0*  HCT 37.9  --   --  35.1* 33.6*  PLT 254  --   --  203 170  HEPARINUNFRC 0.74*   < > 0.72* 0.54 0.56  CREATININE 0.80  --   --  0.79  --    < > = values in this interval not displayed.     Estimated Creatinine Clearance: 42.9 mL/min (by C-G formula based on SCr of 0.79 mg/dL).   Medications:  Scheduled:   cyclobenzaprine  5 mg Oral TID   diltiazem  240 mg Oral Daily   docusate sodium  100 mg Oral BID   dofetilide  125 mcg Oral BID   feeding supplement  1 Container Oral  TID BM   FLUoxetine  40 mg Oral Daily   ipratropium-albuterol  3 mL Nebulization BID   levothyroxine  75 mcg Oral Daily   lidocaine  1 patch Transdermal Q24H   lidocaine  1 patch Transdermal Q24H   mouth rinse  15 mL Mouth Rinse BID   methylPREDNISolone (SOLU-MEDROL) injection  40 mg Intravenous Q24H   multivitamin with minerals  1 tablet Oral Daily   nystatin  5 mL Oral QID   pantoprazole  40 mg Oral Daily   rosuvastatin  40 mg Oral Daily   senna  2 tablet Oral BID   sodium chloride flush  3 mL Intravenous Q12H   Infusions:   cefTRIAXone (ROCEPHIN)  IV 1 g (04/22/21 1321)   heparin 1,200 Units/hr (04/23/21 6948)    Assessment: 80 yo F found to have PE on CT scan. Patient on Eliquis PTA for afib - (last dose 11/15 PM). Heparin initiated but switched to enoxaparin 11/17 given PE treatment in active cancer patient. Decision made to switch back to heparin in preparation for biopsy. Biopsy done 11/21, patient had hemoptysis and heparin was held briefly, heparin resumed 11/22 AM. Pharmacy consulted to dose heparin.   Heparin level today is therapeutic, CBC is stable, ongoing discussions re: palliative care.  Goal of Therapy:  Heparin level 0.3-0.7 units/ml Monitor platelets by anticoagulation protocol: Yes   Plan:  Continue heparin 1200 units/h Daily heparin level and CBC Need to determine longterm [facility] plan  Arrie Senate, PharmD, BCPS, [facility] Clinical Pharmacist [phone] Please check AMION for all [place] numbers 04/23/2021

## 2021-04-23 NOTE — Progress Notes (Signed)
Nutrition Follow-up  DOCUMENTATION CODES:   Non-severe (moderate) malnutrition in context of chronic illness  INTERVENTION:   Continue Vital Cuisine Shake TID with meals, each supplement provides 520 kcal and 22 grams of protein.  Continue ice cream TID with meals.  Continue regular diet, encourage good intake.  NUTRITION DIAGNOSIS:   Moderate Malnutrition related to chronic illness (COPD, CHF) as evidenced by moderate fat depletion, severe muscle depletion.  Ongoing   GOAL:   Patient will meet greater than or equal to 90% of their needs  Progressing   MONITOR:   PO intake, Supplement acceptance, Labs, Weight trends  REASON FOR ASSESSMENT:   Consult Assessment of nutrition requirement/status  ASSESSMENT:   80 year old female who presented to the ED on 11/16 with SOB and chest pain. PMH of COPD with chronic hypoxic respiratory failure, OSA, atrial fibrillation, CAD, CHF, hypothyroidism, recently identified L lung mass. Pt admitted with pulmonary embolism, lytic rib lesion with pathologic fracture.  S/P thoracentesis 11/25 with 400 ml of fluid removed.   Patient c/o no appetite. Two Vital Cuisine Shakes on bedside table, one opened, one closed. Patient states that she does not like them, but she has been drinking them. She enjoys the ice cream with her meals.  Remains on a regular diet. Meal intakes: 30-70%  Palliative care team following. Patient has metastatic lung cancer with no treatment options. Patient is a Hospice candidate.   Labs reviewed.  Medications reviewed and include Colace, Solumedrol, MVI with minerals, Protonix, Senokot, IV antibiotics.  Admission weight 11/16 56.7 kg Current weight 54.8 kg  Diet Order:   Diet Order             Diet regular Room service appropriate? Yes; Fluid consistency: Thin  Diet effective now                   EDUCATION NEEDS:   Education needs have been addressed  Skin:  Skin Assessment: Reviewed RN  Assessment  Last BM:  11/27  Height:   Ht Readings from Last 1 Encounters:  04/10/21 5\' 2"  (1.575 m)    Weight:   Wt Readings from Last 1 Encounters:  04/23/21 54.8 kg    BMI:  Body mass index is 22.1 kg/m.  Estimated Nutritional Needs:   Kcal:  1600-1800  Protein:  75-90 grams  Fluid:  1.6-1.8 L    Lucas Mallow, RD, LDN, CNSC Please refer to Amion for contact information.

## 2021-04-23 NOTE — Progress Notes (Signed)
NAME:  Andrea [name], MRN:  [id number], DOB:  [birth year], LOS: 27 ADMISSION DATE:  04/10/2021, CONSULTATION DATE:  11/16 REFERRING MD:  Algis Liming, CHIEF COMPLAINT:  Dyspnea   History of Present Illness:  80 y/o female with COPD followed by Dr. Lamonte Sakai who was admitted for acute on chronic respiratory failure with hypoxemia due to a large left upper lobe mass, possibly post obstructive pneumonia and a left upper lobe pulmonary emboli.  Found to have a left pleural effusion on CXR on 11/23.  Mass biopsy this admission showed adenocarcinoma.  Admitted on 11/18 for cough with rib fracture and hemoptysis.  Pertinent  Medical History  Asthma CAD, c/p CABG COPD Depression GERD Hyperlipidemia Hypertension Hypothyroidism Osteoporosis PE 1999   Significant Hospital Events: Including procedures, antibiotic start and stop dates in addition to other pertinent events   11/17 CT angiogram chest > left upper lobe thrombus no RV strain, left upper lobe consolidation, lymphangitic spread, right posterior ninth rib fracture, cardiomegaly 45/36 TTE RV systolic function not well visualized, RV size normal, PA pressure 38.5 mmHg 11/21 lung mass biopsy> adenocarcinoma 04/19/2021 thoracentesis 400 cc of bloody fluid  Interim History / Subjective:  Appears to be worsening on daily basis  Objective   Blood pressure 134/86, pulse 85, temperature 97.9 F (36.6 C), temperature source Oral, resp. rate 16, height 5\' 2"  (1.575 m), weight 54.8 kg, SpO2 94 %.    FiO2 (%):  [100 %] 100 %   Intake/Output Summary (Last 24 hours) at 04/23/2021 1040 Last data filed at 04/23/2021 0800 Gross per 24 hour  Intake 1483.63 ml  Output 500 ml  Net 983.63 ml   Filed Weights   04/19/21 0507 04/22/21 0600 04/23/21 0413  Weight: 56.2 kg 55.1 kg 54.8 kg    Examination: General: Frail emaciated female HEENT: No JVD JVD or lymphadenopathy is appreciated Neuro: Arouses to voice follows commands dull affect CV:  Distant PULM: Decreased breath sounds throughout FIO2 100% GI: soft, bsx4 active   Extremities: warm/dry, 1+ edema  Skin: no rashes or lesions    Resolved Hospital Problem list    Assessment & Plan:  Acute respiratory failure with hypoxemia due to: Left lung adenocarcinoma COPD Left upper lobe post obstructive pneumonia Pulmonary embolism Moderate left sided pleural effusion > seen on bedside ultrasound today performed by me.  Unclear if this is malignant, consider hemothorax given biopsy and heparin use  Discussion: Post thoracentesis 04/19/2021 by Dr. Kathalene Frames with 400 cc of bloody fluid pulled from left pleural space.  Pleural fluid no growth x3 days. Last chest x-ray was 04/19/2021  Plan: Order follow-up chest x-ray Continue duo nebs Monitor cultures of pleural fluid negative at 3 days Continue steroid Currently on 100% FiO2 Would pursue comfort care.    Best Practice (right click and "Reselect all SmartList Selections" daily)   Per primary Code status DNR  Labs   CBC: Recent Labs  Lab 04/19/21 0221 04/20/21 0228 04/21/21 0150 04/22/21 0203 04/23/21 0041  WBC 12.8* 15.4* 12.2* 11.0* 11.6*  HGB 9.8* 11.2* 11.6* 10.6* 10.0*  HCT 32.3* 36.9 37.9 35.1* 33.6*  MCV 76.4* 75.6* 75.0* 75.2* 76.2*  PLT 215 261 254 203 468    Basic Metabolic Panel: Recent Labs  Lab 04/17/21 0053 04/18/21 0132 04/21/21 0150 04/22/21 0203  NA 134* 134* 137 135  K 4.2 4.1 4.0 4.0  CL 98 97* 97* 99  CO2 27 29 28 28   GLUCOSE 117* 140* 116* 141*  BUN 15 13 17  20  CREATININE 0.84 0.70 0.80 0.79  CALCIUM 8.6* 8.4* 8.7* 8.5*   GFR: Estimated Creatinine Clearance: 42.9 mL/min (by C-G formula based on SCr of 0.79 mg/dL). Recent Labs  Lab 04/20/21 0228 04/21/21 0150 04/22/21 0203 04/23/21 0041  WBC 15.4* 12.2* 11.0* 11.6*    Liver Function Tests: No results for input(s): AST, ALT, ALKPHOS, BILITOT, PROT, ALBUMIN in the last 168 hours. No results for input(s):  LIPASE, AMYLASE in the last 168 hours. No results for input(s): AMMONIA in the last 168 hours.  ABG    Component Value Date/Time   HCO3 31.4 (H) 08/12/2019 2025   TCO2 30 10/19/2019 0840   O2SAT 41.2 08/12/2019 2025     Coagulation Profile: No results for input(s): INR, PROTIME in the last 168 hours.   Cardiac Enzymes: No results for input(s): CKTOTAL, CKMB, CKMBINDEX, TROPONINI in the last 168 hours.  HbA1C: Hgb A1c MFr Bld  Date/Time Value Ref Range Status  01/10/2021 02:27 PM 6.5 4.6 - 6.5 % Final    Comment:    Glycemic Control Guidelines for People with Diabetes:Non Diabetic:  <6%Goal of Therapy: <7%Additional Action Suggested:  >8%   06/28/2018 03:33 PM 6.2 4.6 - 6.5 % Final    Comment:    Glycemic Control Guidelines for People with Diabetes:Non Diabetic:  <6%Goal of Therapy: <7%Additional Action Suggested:  >8%     CBG: No results for input(s): GLUCAP in the last 168 hours.  Critical care time: n/a    Richardson Landry Yohannes Waibel ACNP Acute Care Nurse Practitioner [place] Please consult [place] 04/23/2021, 10:40 AM

## 2021-04-23 NOTE — Progress Notes (Signed)
Occupational Therapy Treatment Patient Details Name: Fayrene Towner MRN: [id number] DOB: October 27, 1940 Today's Date: 04/23/2021   History of present illness 80 y.o. female presents to [facility] hospital on 04/10/2021 with R sided chest pain. Pt found to have LUL mass as well as lytic lesion on posterior 9th rib. PT also found to have tumor thrombus, started on anticoagulation. Plan for lung biopsy 11/21. PMH includes COPD, with chronic hypoxemic respiratory failure, OSA, atrial fibrillation, CAD, chronic diastolic heart failure and hypothyroidism.   OT comments  Dierra is making limited progress. This session was limited by uncontrollable coughing which was exacerbated by movement. Pt was soiled upon arrival, session spent at bed level for LB ADLs and some grooming with set up and min A. Pt attempted to sit EOB however with coughing, she was unable to sustain sitting balance and returned to supine. Pt requesting breathing treatment, RN notified. Pt will continue to benefit from OT acutely. D/c recommendation remains appropriate.     Recommendations for follow up therapy are one component of a multi-disciplinary discharge planning process, led by the attending physician.  Recommendations may be updated based on patient status, additional functional criteria and insurance authorization.    Follow Up Recommendations  Home health OT    Assistance Recommended at Discharge Frequent or constant Supervision/Assistance  Equipment Recommendations  BSC/3in1       Precautions / Restrictions Precautions Precautions: Fall Precaution Comments: monitor SpO2 Restrictions Weight Bearing Restrictions: No       Mobility Bed Mobility Overal bed mobility: Needs Assistance Bed Mobility: Rolling;Sidelying to Sit Rolling: Min guard Sidelying to sit: Min guard       General bed mobility comments: increased time, and close min guard for safety due to pt coughing and unable to take full breath     Transfers Overall transfer level: Needs assistance Equipment used: None               General transfer comment: deferred this session     Balance Overall balance assessment: Needs assistance Sitting-balance support: Feet supported Sitting balance-Leahy Scale: Fair Sitting balance - Comments: brief sitting and pt had to return to supine this session             ADL either performed or assessed with clinical judgement   ADL Overall ADL's : Needs assistance/impaired     Grooming: Wash/dry face;Set up;Bed level                       Toileting- Clothing Manipulation and Hygiene: Minimal assistance;Bed level Toileting - Clothing Manipulation Details (indicate cue type and reason): min A at bed level due to being soiled upon arrival     Functional mobility during ADLs: Min guard General ADL Comments: pt limited this session by general fatigue, coughing and anxiety    Extremity/Trunk Assessment Upper Extremity Assessment Upper Extremity Assessment: Generalized weakness   Lower Extremity Assessment Lower Extremity Assessment: Defer to PT evaluation        Vision   Vision Assessment?: No apparent visual deficits          Cognition Arousal/Alertness: Awake/alert Behavior During Therapy: WFL for tasks assessed/performed Overall Cognitive Status: Impaired/Different from baseline Area of Impairment: Memory                     Memory: Decreased short-term memory         General Comments: pt very anxious to move this session, knows she needs to but emotional because it  is difficult                General Comments Pt with uncontrolable coughing this session, RN notified    Pertinent Vitals/ Pain       Pain Assessment: Faces Faces Pain Scale: Hurts little more Pain Location: back Pain Descriptors / Indicators: Discomfort;Grimacing Pain Intervention(s): Limited activity within patient's tolerance;Monitored during  session   Frequency  Min 2X/week        Progress Toward Goals  OT Goals(current goals can now be found in the care plan section)  Progress towards OT goals: Progressing toward goals  Acute Rehab OT Goals Patient Stated Goal: breathing treatment OT Goal Formulation: With patient Time For Goal Achievement: 04/27/21 Potential to Achieve Goals: Fair ADL Goals Pt Will Perform Grooming: with supervision;standing Pt Will Perform Upper Body Dressing: with set-up;sitting Pt Will Perform Lower Body Dressing: with set-up;sit to/from stand Pt Will Transfer to Toilet: with supervision;ambulating Pt Will Perform Toileting - Clothing Manipulation and hygiene: with supervision;sitting/lateral leans Additional ADL Goal #1: pt will indep verbalize at least 3 energy conservation techniques to prepare for safe d/c home  Plan Discharge plan remains appropriate       AM-PAC OT "6 Clicks" Daily Activity     Outcome Measure   Help from another person eating meals?: None Help from another person taking care of personal grooming?: A Little Help from another person toileting, which includes using toliet, bedpan, or urinal?: A Little Help from another person bathing (including washing, rinsing, drying)?: A Little Help from another person to put on and taking off regular upper body clothing?: A Little Help from another person to put on and taking off regular lower body clothing?: A Lot 6 Click Score: 18    End of Session Equipment Utilized During Treatment: Oxygen  OT Visit Diagnosis: Other abnormalities of gait and mobility (R26.89);Muscle weakness (generalized) (M62.81);Pain   Activity Tolerance Patient limited by fatigue   Patient Left in bed;with call bell/phone within reach   Nurse Communication Mobility status (pt requesting breathing treatment)        Time: 1350-1410 OT Time Calculation (min): 20 min  Charges: OT General Charges $OT Visit: 1 Visit    Adline Kirshenbaum A  Elexis Pollak 04/23/2021, 2:17 PM

## 2021-04-23 NOTE — Consult Note (Signed)
   [facility] Inpatient Consult   04/23/2021  Andrea [name] 1941/03/09 [id number]  [place] Organization [ACO] Patient: [employer]  Primary Care Provider:  Mosie Lukes, MD [facility], [facility], is an Embedded provider with a Chronic Care Management team and program  Patient screened for length of stay hospitalization with noted high risk score for unplanned readmission risk.  Patient can be referred to Embedded CCM program if needed for TOC follow up.  Review of for disposition and needs.    Plan:  Continue to follow progress and disposition to assess for post hospital care management needs.  Can refer to CCM if needs for TOC.  For questions contact:   Natividad Brood, RN BSN [facility] Liaison  [phone] business mobile phone Toll free office [phone]  Fax number: [phone] [email or web address]

## 2021-04-23 NOTE — Progress Notes (Signed)
PROGRESS NOTE  Andrea [name]  BLT:[id number] DOB: 1940-07-20 DOA: 04/10/2021 PCP: Mosie Lukes, MD   Brief Narrative: 80 year old female, medical history significant for COPD, OSA on CPAP, CAD s/p CABG, hypertension, PAD, former smoker, history of PE in the past after her CABG, A. fib, chronic diastolic CHF, hypothyroid, recently saw pulmonology/Dr. Lamonte Sakai on 04/02/2021 for a left upper lobe mass and lytic lesion on the posterior right ninth rib identified on CT chest 03/07/2021, significantly hypermetabolic on PET 00/76/2263, planned outpatient biopsy on 04/15/2021, now admitted 04/10/2021 with complaints of acute onset of severe right-sided rib cage pain, worse with coughing and dyspnea.  CT chest showed left upper lobe with bland thrombus versus tumor thrombus, large area of consolidation in the left upper lobe with lymphangitic spread, right posterior ninth rib lesion with pathological fracture.  Admitted for large left upper lobe mass, biopsy now confirms adenocarcinoma/non-small cell lung cancer with acute pulmonary embolism/possible tumor thrombus, pathological ninth rib fracture, postobstructive pneumonia, possible COPD exacerbation and acute on chronic hypoxic respiratory failure. Hospital course complicated by progressive acute respiratory failure with hypoxia requiring HFNC.  PCCM consulted and s/p thoracentesis 11/25 yielding 400 mL with little improvement in dyspnea or hypoxia.  Palliative care consulted for goals of care and will need to follow-up.  Oncology consulted, confirmed she is not currently a candidate for therapy.   Assessment & Plan: Principal Problem:   Pulmonary embolism (HCC) Active Problems:   CAD (coronary artery disease)   Hypothyroidism   Chronic respiratory failure with hypoxia (HCC)   Atrial fibrillation, chronic (HCC)   COPD (chronic obstructive pulmonary disease) (HCC)   Chronic diastolic CHF (congestive heart failure) (HCC)   Lung mass   Pathological fracture  of one rib   Microcytic anemia   Malnutrition of moderate degree  Acute on chronic respiratory failure with hypoxia: Multifactorial inclusive of large left upper lobe NSCLC with lymphangitic spread, postobstructive pneumonia, suspected parapneumonic effusion (PMN predominance), pulmonary embolism, COPD exacerbation.   - Continue HFNC supplementation. Down to 15L O2 at rest, 2L is baseline.   Non-small cell lung cancer/adenocarcinoma, complicated by right posterior ninth rib fracture with chest wall pain, lymphangitic spread to LUL, hemoptysis (resolved), postobstructive pneumonia and pleural effusion: - CT-guided needle biopsy by IR on 04/15/2021.  Pathology confirmed adenocarcinoma.  - Oncology input appreciated.  Metastatic disease, lung cancer not curable.  Given high O2 demands, poor performance status, not candidate for systemic treatment.  Recommended comfort based approach and referral to hospice. Pt is in the process of making final arrangements, does not currently opt for full comfort measures.  - Palliative care follow-up appreciated. Will revisit goals of care with patient after a couple more days of aggressive treatment.  - PCCM following.  S/p left thoracentesis 11/25 yielding only 400 mL with little relief of her dyspnea or hypoxia, therefore pleurx unlikely to be helpful.   Left upper lobe PE (bland thrombus versus tumor thrombus): No RV strain on echo.  - Continue IV heparin for now.   Suspected left upper lobe postobstructive pneumonia: - Continue empiric ceftriaxone x10 days.  1 cm right upper lobe subpleural nodule: - Outpatient follow-up. May be part of the above malignant process.  COPD exacerbation: - Continue steroids, antibiotics, inhaled steroids.   Microcytic anemia: May be related to anemia of chronic disease or iron deficiency anemia. Stable.  - Work up depending on goals of care discussions going forward.   PAF: NSR currently. - Continue dofetilide and  diltiazem  - IV heparin  as above  Chronic HFpEF: Euvolemic currently. 2D echo showed LVEF of 50-55%, moderate hypokinesis of left ventricle based mid inferior wall and inferior septal wall. - No changes to medications at this time.   CAD: No angina at this time.  - Holding nitrate due to hypotension.  - On statin.  Hyperlipidemia - Continue statin  Oral thrush: Improved. - Continue nystatin swish and swallow.    Depression - Continue fluoxetine  Hypothyroidism: Last TSH wnl - Continue synthroid.   Moderate protein-calorie malnutrition:  - MVM, supplement protein as able w/boost breeze  Generalized pain: Multifactorial.  - Lidoderm patches - Oxycodone prn. - Ok to give dilaudid prn in patient who may be approaching hospice.  Anxiety:  - Xanax prn, fluoxetine daily  DVT prophylaxis: Heparin IV Code Status: DNR Family Communication: Daughter and granddaughter again at bedside Disposition Plan:  Status is: Inpatient  Remains inpatient appropriate because: Severe respiratory failure  Consultants:  Oncology PCCM IR Palliative care  Procedures:  CT-guided needle biopsy of lung mass by interventional radiology on 11/21  Antimicrobials: Ceftriaxone 11/20 >>    Subjective: Having migratory generalized pain, currently worse throughout back worse with laying in bed for prolonged periods. Dyspnea is stable, moderate-severe, worse with exertion but does want to get up again OOB today. No chest pain or bleeding. Continues to desire aggressive treatments as discussed.  Objective: Vitals:   04/23/21 0714 04/23/21 0847 04/23/21 0857 04/23/21 1419  BP: 140/84  134/86   Pulse: 83  85 85  Resp: 16     Temp: 97.9 F (36.6 C)     TempSrc: Oral     SpO2: 91% 94% 94% 93%  Weight:      Height:        Intake/Output Summary (Last 24 hours) at 04/23/2021 1505 Last data filed at 04/23/2021 1230 Gross per 24 hour  Intake 1843.63 ml  Output 1400 ml  Net 443.63 ml   Filed  Weights   04/19/21 0507 04/22/21 0600 04/23/21 0413  Weight: 56.2 kg 55.1 kg 54.8 kg   Gen: Frail elderly female in no distress Pulm: Nonlabored at rest, diminished on left, normal WOB on 15L. CV: Regular rate and rhythm. No murmur, rub, or gallop. No JVD, no pitting dependent edema. GI: Abdomen soft, non-tender, non-distended, with normoactive bowel sounds.  Ext: Warm, no deformities. +clubbing. Skin: No new rashes, lesions or ulcers on visualized skin. Neuro: Alert and oriented. No focal neurological deficits. Psych: Judgement and insight appear fair. Mood euthymic & affect congruent. Behavior is appropriate.    Data Reviewed: I have personally reviewed following labs and imaging studies  CBC: Recent Labs  Lab 04/19/21 0221 04/20/21 0228 04/21/21 0150 04/22/21 0203 04/23/21 0041  WBC 12.8* 15.4* 12.2* 11.0* 11.6*  HGB 9.8* 11.2* 11.6* 10.6* 10.0*  HCT 32.3* 36.9 37.9 35.1* 33.6*  MCV 76.4* 75.6* 75.0* 75.2* 76.2*  PLT 215 261 254 203 834   Basic Metabolic Panel: Recent Labs  Lab 04/17/21 0053 04/18/21 0132 04/21/21 0150 04/22/21 0203  NA 134* 134* 137 135  K 4.2 4.1 4.0 4.0  CL 98 97* 97* 99  CO2 27 29 28 28   GLUCOSE 117* 140* 116* 141*  BUN 15 13 17 20   CREATININE 0.84 0.70 0.80 0.79  CALCIUM 8.6* 8.4* 8.7* 8.5*   GFR: Estimated Creatinine Clearance: 42.9 mL/min (by C-G formula based on SCr of 0.79 mg/dL). Liver Function Tests: No results for input(s): AST, ALT, ALKPHOS, BILITOT, PROT, ALBUMIN in the last 168 hours.  No results for input(s): LIPASE, AMYLASE in the last 168 hours. No results for input(s): AMMONIA in the last 168 hours. Coagulation Profile: No results for input(s): INR, PROTIME in the last 168 hours. Cardiac Enzymes: No results for input(s): CKTOTAL, CKMB, CKMBINDEX, TROPONINI in the last 168 hours. BNP (last 3 results) No results for input(s): PROBNP in the last 8760 hours. HbA1C: No results for input(s): HGBA1C in the last 72  hours. CBG: No results for input(s): GLUCAP in the last 168 hours. Lipid Profile: No results for input(s): CHOL, HDL, LDLCALC, TRIG, CHOLHDL, LDLDIRECT in the last 72 hours. Thyroid Function Tests: No results for input(s): TSH, T4TOTAL, FREET4, T3FREE, THYROIDAB in the last 72 hours. Anemia Panel: No results for input(s): VITAMINB12, FOLATE, FERRITIN, TIBC, IRON, RETICCTPCT in the last 72 hours. Urine analysis:    Component Value Date/Time   COLORURINE YELLOW 06/28/2018 [place] 06/28/2018 1533   LABSPEC 1.020 06/28/2018 1533   PHURINE 5.5 06/28/2018 1533   GLUCOSEU NEGATIVE 06/28/2018 1533   HGBUR NEGATIVE 06/28/2018 1533   BILIRUBINUR NEGATIVE 06/28/2018 1533   BILIRUBINUR negative 03/27/2015 [place] 06/28/2018 1533   PROTEINUR negative 03/27/2015 1833   PROTEINUR NEGATIVE 07/15/2014 1548   UROBILINOGEN 0.2 06/28/2018 1533   NITRITE NEGATIVE 06/28/2018 1533   LEUKOCYTESUR NEGATIVE 06/28/2018 1533   Recent Results (from the past 240 hour(s))  Body fluid culture w Gram Stain     Status: None   Collection Time: 04/19/21  1:38 PM   Specimen: Pleural Fluid  Result Value Ref Range Status   Specimen Description PLEURAL FLUID  Final   Special Requests PLEURAL  Final   Gram Stain   Final    MODERATE WBC PRESENT, PREDOMINANTLY PMN NO ORGANISMS SEEN    Culture   Final    NO GROWTH 3 DAYS Performed at [facility], [place] [address] [place], [place] [address]    Report Status 04/22/2021 FINAL  Final      Radiology Studies: No results found.  Scheduled Meds:  cyclobenzaprine  5 mg Oral TID   diltiazem  240 mg Oral Daily   docusate sodium  100 mg Oral BID   dofetilide  125 mcg Oral BID   FLUoxetine  40 mg Oral Daily   ipratropium-albuterol  3 mL Nebulization BID   levothyroxine  75 mcg Oral Daily   lidocaine  1 patch Transdermal Q24H   lidocaine  1 patch Transdermal Q24H   mouth rinse  15 mL Mouth Rinse BID   methylPREDNISolone  (SOLU-MEDROL) injection  40 mg Intravenous Q24H   multivitamin with minerals  1 tablet Oral Daily   nystatin  5 mL Oral QID   pantoprazole  40 mg Oral Daily   rosuvastatin  40 mg Oral Daily   senna  2 tablet Oral BID   sodium chloride flush  3 mL Intravenous Q12H   Continuous Infusions:  heparin 1,200 Units/hr (04/23/21 0613)     LOS: 11 days   Time spent: 25 minutes.  Patrecia Pour, MD [facility] [email or web address] 04/23/2021, 3:05 PM

## 2021-04-24 ENCOUNTER — Inpatient Hospital Stay (HOSPITAL_COMMUNITY): Payer: Medicare HMO

## 2021-04-24 DIAGNOSIS — I5032 Chronic diastolic (congestive) heart failure: Secondary | ICD-10-CM | POA: Diagnosis not present

## 2021-04-24 DIAGNOSIS — R918 Other nonspecific abnormal finding of lung field: Secondary | ICD-10-CM | POA: Diagnosis not present

## 2021-04-24 DIAGNOSIS — J9601 Acute respiratory failure with hypoxia: Secondary | ICD-10-CM | POA: Diagnosis not present

## 2021-04-24 DIAGNOSIS — I2699 Other pulmonary embolism without acute cor pulmonale: Secondary | ICD-10-CM | POA: Diagnosis not present

## 2021-04-24 LAB — HEPARIN LEVEL (UNFRACTIONATED): Heparin Unfractionated: 0.6 IU/mL (ref 0.30–0.70)

## 2021-04-24 LAB — BASIC METABOLIC PANEL
Anion gap: 7 (ref 5–15)
BUN: 17 mg/dL (ref 8–23)
CO2: 28 mmol/L (ref 22–32)
Calcium: 8.2 mg/dL — ABNORMAL LOW (ref 8.9–10.3)
Chloride: 98 mmol/L (ref 98–111)
Creatinine, Ser: 0.63 mg/dL (ref 0.44–1.00)
GFR, Estimated: >60 mL/min (ref >60)
Glucose, Bld: 157 mg/dL — ABNORMAL HIGH (ref 70–99)
Potassium: 3.8 mmol/L (ref 3.5–5.1)
Sodium: 133 mmol/L — ABNORMAL LOW (ref 135–145)

## 2021-04-24 LAB — CBC
HCT: 35.5 % — ABNORMAL LOW (ref 36.0–46.0)
Hemoglobin: 10.6 g/dL — ABNORMAL LOW (ref 12.0–15.0)
MCH: 22.7 pg — ABNORMAL LOW (ref 26.0–34.0)
MCHC: 29.9 g/dL — ABNORMAL LOW (ref 30.0–36.0)
MCV: 76.2 fL — ABNORMAL LOW (ref 80.0–100.0)
Platelets: 154 10*3/uL (ref 150–400)
RBC: 4.66 MIL/uL (ref 3.87–5.11)
RDW: 20.1 % — ABNORMAL HIGH (ref 11.5–15.5)
WBC: 13.6 10*3/uL — ABNORMAL HIGH (ref 4.0–10.5)
nRBC: 0 % (ref 0.0–0.2)

## 2021-04-24 MED ORDER — ONDANSETRON HCL 4 MG/2ML IJ SOLN: 4.0000 mg | Freq: Four times a day (QID) | INTRAMUSCULAR | Status: DC | PRN

## 2021-04-24 MED ORDER — GLYCOPYRROLATE 1 MG PO TABS
1.0000 mg | ORAL_TABLET | ORAL | Status: DC | PRN
  Filled 2021-04-24: qty 1

## 2021-04-24 MED ORDER — HALOPERIDOL 0.5 MG PO TABS
0.5000 mg | ORAL_TABLET | ORAL | Status: DC | PRN
  Filled 2021-04-24: qty 1

## 2021-04-24 MED ORDER — ONDANSETRON 4 MG PO TBDP
4.0000 mg | ORAL_TABLET | Freq: Four times a day (QID) | ORAL | Status: DC | PRN
  Filled 2021-04-24: qty 1

## 2021-04-24 MED ORDER — GLYCOPYRROLATE 0.2 MG/ML IJ SOLN
0.2000 mg | INTRAMUSCULAR | Status: DC | PRN
  Administered 2021-04-25: 0.2 mg via INTRAVENOUS

## 2021-04-24 MED ORDER — MORPHINE 100MG IN NS 100ML (1MG/ML) PREMIX INFUSION
1.0000 mg/h | INTRAVENOUS | Status: DC
  Administered 2021-04-24 (×2): 1 mg/h via INTRAVENOUS
  Filled 2021-04-24: qty 100

## 2021-04-24 MED ORDER — BIOTENE DRY MOUTH MT LIQD: 15.0000 mL | OROMUCOSAL | Status: DC | PRN

## 2021-04-24 MED ORDER — HALOPERIDOL LACTATE 2 MG/ML PO CONC
0.5000 mg | ORAL | Status: DC | PRN
  Filled 2021-04-24: qty 0.3

## 2021-04-24 MED ORDER — POLYVINYL ALCOHOL 1.4 % OP SOLN: 1.0000 [drp] | Freq: Four times a day (QID) | OPHTHALMIC | Status: DC | PRN

## 2021-04-24 MED ORDER — MORPHINE SULFATE (PF) 2 MG/ML IV SOLN
2.0000 mg | INTRAVENOUS | Status: AC
  Administered 2021-04-24: 2 mg via INTRAVENOUS
  Filled 2021-04-24: qty 1

## 2021-04-24 MED ORDER — GLYCOPYRROLATE 0.2 MG/ML IJ SOLN
0.2000 mg | INTRAMUSCULAR | Status: DC | PRN
  Filled 2021-04-24: qty 1

## 2021-04-24 MED ORDER — HALOPERIDOL LACTATE 5 MG/ML IJ SOLN: 0.5000 mg | INTRAMUSCULAR | Status: DC | PRN

## 2021-04-24 MED ORDER — MORPHINE BOLUS VIA INFUSION
2.0000 mg | INTRAVENOUS | Status: DC | PRN
  Administered 2021-04-25 (×2): 2 mg via INTRAVENOUS
  Filled 2021-04-24: qty 2

## 2021-04-24 NOTE — Care Management (Signed)
1638 04-24-21 Case Manager received consult to arrange Hospice Services at the [facility] via [facility]. Case Manager did call [facility] Liaison regarding consult and the liaison will check to see if there is a waiting list. Hospice Liaison will contact Case Manager regarding bed availability. Case Manager will continue to follow.

## 2021-04-24 NOTE — Progress Notes (Signed)
PROGRESS NOTE    Andrea [name]  WNI:[id number] DOB: May 13, 1941 DOA: 04/10/2021 PCP: Mosie Lukes, MD   Brief Narrative: The patient is an 80 year old Caucasian female with a past medical history significant for but not limited to COPD, OSA on CPAP, CAD status post CABG, hypertension, PAD, history of former smoker and tobacco abuse, history of PE in the past after a CABG, atrial fibrillation, chronic diastolic CHF, hypothyroidism, as well as other comorbidities who recently saw her pulmonologist Dr. Lamonte Sakai on 04/02/2021 for left upper lobe mass and lytic lesion on the posterior right ninth rib identified on the CT scan on 03/07/2021 which was significantly hypermetabolic on the PET scan on 03/21/2021 with an outpatient plan biopsy on 04/15/2021 now admitted on 04/10/2021 with complaints of acute onset of severe right-sided rib cage pain worse with coughing and dyspnea.  CT of the chest showed a left upper lobe with bland thrombus versus tumor thrombus and a large area of consolidation left upper lobe with lymphangitic spread, right posterior ninth rib lesion with pathological fracture.  Was admitted for a large left upper lobe mass and biopsy now confirms adenocarcinoma/non-small cell lung cancer with acute pulmonary embolus/possible tumor thrombus, pathological answered fracture, postobstructive pneumonia, possible COPD exacerbation with acute on chronic hypoxic respiratory failure.  Her hospital course of been complicated by progressive acute respiratory failure and hypoxia requiring high flow nasal cannula.  PCCM has been consulted and she is status post thoracentesis on 04/19/2021 yielding 400 mL with little improvement in her dyspnea or hypoxia.  Palliative care has been consulted for goals of care and she will need to follow-up in oncology was consulted and confirmed that she is not a candidate for therapy.  After further goals of care discussion patient now elected to transition to full comfort care  with the goal of going to residential hospice in [place].  She was initiated on morphine infusion with 1 mg/h with 2 mg every 15 minutes as needed dosing for any shortness of breath or discomfort.  The current plan is to discharge to hospice facility however if she further decompensates or worsens in hospital death may happen.  Assessment & Plan:   Principal Problem:   Pulmonary embolism (HCC) Active Problems:   CAD (coronary artery disease)   Hypothyroidism   Chronic respiratory failure with hypoxia (HCC)   Atrial fibrillation, chronic (HCC)   COPD (chronic obstructive pulmonary disease) (HCC)   Chronic diastolic CHF (congestive heart failure) (HCC)   Lung mass   Pathological fracture of one rib   Microcytic anemia   Malnutrition of moderate degree    Acute on chronic respiratory failure with hypoxia: Multifactorial inclusive of large left upper lobe NSCLC with lymphangitic spread, postobstructive pneumonia, suspected parapneumonic effusion (PMN predominance), pulmonary embolism, COPD exacerbation.   - Continue HFNC supplementation. Down to 15L O2 at rest, 2L is baseline.  -Very difficult to wean her off of 15 L with continued shortness of breath cynical after further goals of care discussion with palliative the patient made the decision to elect for comfort and all medications not in the patient's comfort have been discontinued with plans for residential hospice in [facility]   Non-small cell lung cancer/adenocarcinoma, complicated by right posterior ninth rib fracture with chest wall pain, lymphangitic spread to LUL, hemoptysis (resolved), postobstructive pneumonia and pleural effusion: - CT-guided needle biopsy by IR on 04/15/2021.  Pathology confirmed adenocarcinoma.  - Oncology input appreciated.  Metastatic disease, lung cancer not curable.  Given high O2 demands, poor performance  status, not candidate for systemic treatment.  Recommended comfort based approach and referral to  hospice. Pt is in the process of making final arrangements, does not currently opt for full comfort measures.  - Palliative care follow-up appreciated. Will revisit goals of care with patient after a couple more days of aggressive treatment.  - PCCM following.  S/p left thoracentesis 11/25 yielding only 400 mL with little relief of her dyspnea or hypoxia, therefore pleurx unlikely to be helpful. -She is now full comfort care   Left upper lobe PE (bland thrombus versus tumor thrombus) -No RV strain on echo.  -Was on anticoagulation with a heparin drip but have now discontinued this given that she is going to hospice  Suspected left upper lobe postobstructive pneumonia: - Continued empiric ceftriaxone x10 days given her lack of improvement and after further goals of care discussion she will be transition to hospice  1 cm right upper lobe subpleural nodule: -Will not follow this in the outpatient given that she is being transitioned to hospice  COPD exacerbation: - Continued steroids, antibiotics, inhaled steroids but this has now been discontinued given that she has been transitioned to hospice   Microcytic anemia:  -May be related to anemia of chronic disease or iron deficiency anemia. Stable.  - Work up depending on goals of care discussions going forward and we will not further work this up given that she is being transitioned to full comfort care.   PAF: -NSR currently. -We have discontinued her dofetilide, diltiazem and IV heparin given that she has been transitioned to comfort care and anticipate discharging to a residential hospice  Chronic HFpEF:  -Euvolemic currently. 2D echo showed LVEF of 50-55%, moderate hypokinesis of left ventricle based mid inferior wall and inferior septal wall. -She is now being transitioned to hospice all the medications not in patient's comfort have been discontinued  CAD: -No angina at this time.  - Holding nitrate due to hypotension.  - On statin  but as below has been discontinued  Hyperlipidemia -Continue statin but this is now been discontinued given that the goals of care now shifted towards comfort  Oral thrush: Improved. - Continue nystatin swish and swallow.    Depression - Continued fluoxetine for now as she is being transitioned to hospice  Hypothyroidism: -Last TSH wnl - Continue synthroid patient has elected to go to hospice and will be transitioned to full comfort measures with expectation in anticipation to be discharged to a residential hospice facility   Moderate protein-calorie malnutrition: -Estimated body mass index is 22.1 kg/m as calculated from the following:   Height as of this encounter: 5\' 2"  (1.575 m).   Weight as of this encounter: 54.8 kg. - MVM, supplement protein as able w/boost breeze   Generalized pain: -Multifactorial.  - Lidoderm patches - Oxycodone prn. - Ok to give dilaudid prn in patient who may be approaching hospice but has not been transitioned to a morphine drip per palliative.   Anxiety:  - Xanax prn, fluoxetine daily   DVT prophylaxis: None given that she is now comfort care Code Status: DO NOT RESUSCITATE Family Communication: No family currently at bedside but was updated by palliative care Disposition Plan: Anticipating discharging to residential hospice once bed is available  Status is: Inpatient  Remains inpatient appropriate because: Patient remains on significant amount of oxygen and needs residential hospice placement given that she is now being transitioned to end-of-life care  Consultants:  Medical oncology PCCM/pulmonary Interventional radiology Palliative care medicine  Procedures: CT-guided needle biopsy of the lung mass done by interventional radiology on 04/15/2021  Antimicrobials:  Anti-infectives (From admission, onward)    Start     Dose/Rate Route Frequency Ordered Stop   04/14/21 1430  cefTRIAXone (ROCEPHIN) 1 g in sodium chloride 0.9 % 100 mL  IVPB        1 g 200 mL/hr over 30 Minutes Intravenous Every 24 hours 04/14/21 1338 04/23/21 1440        Subjective: Seen and examined at bedside and remains on significant amount of oxygen and was not feeling much better.  Denies any chest pain but continues to have generalized pain.  She states that she has continued dyspnea worse with exertion.  After further goals of care discussion a decision was made to transition the patient to full comfort measures with the patient wanting to go to residential hospice in [place]  Objective: Vitals:   04/23/21 2013 04/24/21 0054 04/24/21 0407 04/24/21 0751  BP: 137/60     Pulse: 80 74    Resp: 20 18 20    Temp: 97.9 F (36.6 C)  98.2 F (36.8 C)   TempSrc: Oral  Oral   SpO2: 91% 93% 92% 100%  Weight:      Height:        Intake/Output Summary (Last 24 hours) at 04/24/2021 0817 Last data filed at 04/24/2021 0400 Gross per 24 hour  Intake 1008.89 ml  Output 900 ml  Net 108.89 ml   Filed Weights   04/19/21 0507 04/22/21 0600 04/23/21 0413  Weight: 56.2 kg 55.1 kg 54.8 kg   Examination: Physical Exam:  Constitutional: Thin frail cachectic chronically ill-appearing Caucasian female currently in some respiratory distress Eyes: Lids and conjunctivae normal, sclerae anicteric  ENMT: External Ears, Nose appear normal. Grossly normal hearing. Mucous membranes are moist.  Neck: Appears normal, supple, no cervical masses, normal ROM, no appreciable thyromegaly; no appreciable JVD Respiratory: Diminished to auscultation bilaterally with coarse breath sounds and some rhonchi and crackles.  Has an increased respiratory rate and is tachypneic and wearing supplemental oxygen via nasal cannula at 15 L HFNC Cardiovascular: Slightly tachycardic, no murmurs / rubs / gallops. S1 and S2 auscultated. No extremity edema. Abdomen: Soft, non-tender, non-distended.  Bowel sounds positive.  GU: Deferred. Musculoskeletal: No clubbing / cyanosis of  digits/nails. No joint deformity upper and lower extremities.  Skin: No rashes, lesions, ulcers on limited skin evaluation. No induration; Warm and dry.  Neurologic: CN 2-12 grossly intact with no focal deficits.  Romberg sign and cerebellar reflexes not assessed.  Psychiatric: Normal judgment and insight. Alert and oriented x 3. Normal mood and appropriate affect.   Data Reviewed: I have personally reviewed following labs and imaging studies  CBC: Recent Labs  Lab 04/20/21 0228 04/21/21 0150 04/22/21 0203 04/23/21 0041 04/24/21 0210  WBC 15.4* 12.2* 11.0* 11.6* 13.6*  HGB 11.2* 11.6* 10.6* 10.0* 10.6*  HCT 36.9 37.9 35.1* 33.6* 35.5*  MCV 75.6* 75.0* 75.2* 76.2* 76.2*  PLT 261 254 203 170 660   Basic Metabolic Panel: Recent Labs  Lab 04/18/21 0132 04/21/21 0150 04/22/21 0203 04/24/21 0210  NA 134* 137 135 133*  K 4.1 4.0 4.0 3.8  CL 97* 97* 99 98  CO2 29 28 28 28   GLUCOSE 140* 116* 141* 157*  BUN 13 17 20 17   CREATININE 0.70 0.80 0.79 0.63  CALCIUM 8.4* 8.7* 8.5* 8.2*   GFR: Estimated Creatinine Clearance: 42.9 mL/min (by C-G formula based on SCr of 0.63 mg/dL). Liver  Function Tests: No results for input(s): AST, ALT, ALKPHOS, BILITOT, PROT, ALBUMIN in the last 168 hours. No results for input(s): LIPASE, AMYLASE in the last 168 hours. No results for input(s): AMMONIA in the last 168 hours. Coagulation Profile: No results for input(s): INR, PROTIME in the last 168 hours. Cardiac Enzymes: No results for input(s): CKTOTAL, CKMB, CKMBINDEX, TROPONINI in the last 168 hours. BNP (last 3 results) No results for input(s): PROBNP in the last 8760 hours. HbA1C: No results for input(s): HGBA1C in the last 72 hours. CBG: No results for input(s): GLUCAP in the last 168 hours. Lipid Profile: No results for input(s): CHOL, HDL, LDLCALC, TRIG, CHOLHDL, LDLDIRECT in the last 72 hours. Thyroid Function Tests: No results for input(s): TSH, T4TOTAL, FREET4, T3FREE, THYROIDAB in  the last 72 hours. Anemia Panel: No results for input(s): VITAMINB12, FOLATE, FERRITIN, TIBC, IRON, RETICCTPCT in the last 72 hours. Sepsis Labs: No results for input(s): PROCALCITON, LATICACIDVEN in the last 168 hours.  Recent Results (from the past 240 hour(s))  Body fluid culture w Gram Stain     Status: None   Collection Time: 04/19/21  1:38 PM   Specimen: Pleural Fluid  Result Value Ref Range Status   Specimen Description PLEURAL FLUID  Final   Special Requests PLEURAL  Final   Gram Stain   Final    MODERATE WBC PRESENT, PREDOMINANTLY PMN NO ORGANISMS SEEN    Culture   Final    NO GROWTH 3 DAYS Performed at [facility], [address], [place], [place] [address]    Report Status 04/22/2021 FINAL  Final     RN Pressure Injury Documentation:     Estimated body mass index is 22.1 kg/m as calculated from the following:   Height as of this encounter: 5\' 2"  (1.575 m).   Weight as of this encounter: 54.8 kg.  Malnutrition Type:  Nutrition Problem: Moderate Malnutrition Etiology: chronic illness (COPD, CHF)   Malnutrition Characteristics:  Signs/Symptoms: moderate fat depletion, severe muscle depletion   Nutrition Interventions:  Interventions: Boost Breeze, MVI   Radiology Studies: No results found.  Scheduled Meds:  cyclobenzaprine  5 mg Oral TID   diltiazem  240 mg Oral Daily   docusate sodium  100 mg Oral BID   dofetilide  125 mcg Oral BID   FLUoxetine  40 mg Oral Daily   ipratropium-albuterol  3 mL Nebulization BID   levothyroxine  75 mcg Oral Daily   lidocaine  1 patch Transdermal Q24H   lidocaine  1 patch Transdermal Q24H   mouth rinse  15 mL Mouth Rinse BID   methylPREDNISolone (SOLU-MEDROL) injection  40 mg Intravenous Q24H   multivitamin with minerals  1 tablet Oral Daily   nystatin  5 mL Oral QID   pantoprazole  40 mg Oral Daily   rosuvastatin  40 mg Oral Daily   senna  2 tablet Oral BID   sodium chloride flush  3 mL Intravenous Q12H    Continuous Infusions:  heparin Stopped (04/24/21 0359)    LOS: 12 days   Kerney Elbe, DO [facility] PAGER is on AMION  If 7PM-7AM, please contact night-coverage [email or web address]

## 2021-04-24 NOTE — Progress Notes (Signed)
Physical Therapy Treatment Patient Details Name: Andrea [name] MRN: [id number] DOB: 1940-12-31 Today's Date: 04/24/2021   History of Present Illness 80 y.o. female presents to [facility] hospital on 04/10/2021 with R sided chest pain. Pt found to have LUL mass as well as lytic lesion on posterior 9th rib. PT also found to have tumor thrombus, started on anticoagulation. Plan for lung biopsy 11/21. PMH includes COPD, with chronic hypoxemic respiratory failure, OSA, atrial fibrillation, CAD, chronic diastolic heart failure and hypothyroidism.    PT Comments    Pt limited in mobility by a decline in SpO2 levels this date. When pt is sitting her SpO2 drops to as low as 78% on 15L O2 via HFNC, possibly due to flexion of the hips and trunk reducing lung expansion. When pt is standing, her SpO2 improved to 90s% on 15L O2 via HFNC. However, limited session to bed mobility and x1 sit to stand transfer to change her wet sheets due to her poor sats and avoided sitting pt in recliner as sitting appears to cause her sats to worsen. Will continue to follow acutely. Current recommendations remain appropriate as she continues to display deficits in strength and activity tolerance.   Recommendations for follow up therapy are one component of a multi-disciplinary discharge planning process, led by the attending physician.  Recommendations may be updated based on patient status, additional functional criteria and insurance authorization.  Follow Up Recommendations  Home health PT (unsure if pt will accept)     Assistance Recommended at Discharge Intermittent Supervision/Assistance  Equipment Recommendations  Rollator (4 wheels)    Recommendations for Other Services       Precautions / Restrictions Precautions Precautions: Fall Precaution Comments: monitor SpO2 Restrictions Weight Bearing Restrictions: No     Mobility  Bed Mobility Overal bed mobility: Needs Assistance Bed Mobility: Supine to Sit;Sit to  Supine     Supine to sit: Min assist;HOB elevated Sit to supine: Supervision   General bed mobility comments: Increased time and cuing for hand placement on bed rail to pull trunk to ascend, pt requesting 1 of PT's hands to complete trunk ascent, minA. Supervision to return to supine.    Transfers Overall transfer level: Needs assistance Equipment used: Rolling walker (2 wheels) Transfers: Sit to/from Stand Sit to Stand: Min guard           General transfer comment: Min guard for safety, x1 rep to change wet sheets from under pt. Noted improved sats when standing, SpO2 in 90s%, compared to in sitting, SpO2 as low as 78%    Ambulation/Gait               General Gait Details: Deferred due to low sats   Stairs             Wheelchair Mobility    Modified Rankin (Stroke Patients Only)       Balance Overall balance assessment: Needs assistance Sitting-balance support: Feet supported;No upper extremity supported Sitting balance-Leahy Scale: Fair Sitting balance - Comments: Sitting statically EOB with supervision for safety.   Standing balance support: Reliant on assistive device for balance Standing balance-Leahy Scale: Poor Standing balance comment: requires UE on RW                            Cognition Arousal/Alertness: Awake/alert Behavior During Therapy: WFL for tasks assessed/performed Overall Cognitive Status: Impaired/Different from baseline Area of Impairment: Memory  Memory: Decreased short-term memory         General Comments: Pt needing reminders of plan for next task at times.        Exercises      General Comments General comments (skin integrity, edema, etc.): Pt receiving 15L O2 via HFNC, SpO2 >/= 89% at start of session supine at rest, as low as 78% when sitting EOB, improved to 90s% when standing, deferred ambulation or sitting in recliner due to tendency for low sats this date       Pertinent Vitals/Pain Pain Assessment: Faces Faces Pain Scale: Hurts little more Breathing: normal Negative Vocalization: none Facial Expression: smiling or inexpressive Body Language: relaxed Consolability: no need to console PAINAD Score: 0 Pain Location: back Pain Descriptors / Indicators: Discomfort;Grimacing Pain Intervention(s): Limited activity within patient's tolerance;Monitored during session;Repositioned    Home Living                          Prior Function            PT Goals (current goals can now be found in the care plan section) Acute Rehab PT Goals Patient Stated Goal: to get up OOB PT Goal Formulation: With patient Time For Goal Achievement: 04/26/21 Potential to Achieve Goals: Good Progress towards PT goals: Not progressing toward goals - comment (limited by low SpO2 levels)    Frequency    Min 3X/week      PT Plan Current plan remains appropriate    Co-evaluation              AM-PAC PT "6 Clicks" Mobility   Outcome Measure  Help needed turning from your back to your side while in a flat bed without using bedrails?: A Little Help needed moving from lying on your back to sitting on the side of a flat bed without using bedrails?: A Little Help needed moving to and from a bed to a chair (including a wheelchair)?: A Little Help needed standing up from a chair using your arms (e.g., wheelchair or bedside chair)?: A Little Help needed to walk in hospital room?: A Little Help needed climbing 3-5 steps with a railing? : A Lot 6 Click Score: 17    End of Session Equipment Utilized During Treatment: Oxygen Activity Tolerance: Patient limited by fatigue;Other (comment) (limited by low sats) Patient left: in bed;with call bell/phone within reach;with bed alarm set Nurse Communication: Mobility status;Other (comment) (sats) PT Visit Diagnosis: Other abnormalities of gait and mobility (R26.89);Muscle weakness (generalized)  (M62.81);Unsteadiness on feet (R26.81);Difficulty in walking, not elsewhere classified (R26.2)     Time: 1093-2355 PT Time Calculation (min) (ACUTE ONLY): 21 min  Charges:  $Therapeutic Activity: 8-22 mins                     Moishe Spice, PT, DPT Acute Rehabilitation Services  Pager: [phone] Office: [place] 04/24/2021, 4:09 PM

## 2021-04-24 NOTE — Progress Notes (Signed)
Daily Progress Note   Patient Name: Andrea [name]       Date: 04/24/2021 DOB: 1940/08/30  Age: 80 y.o. MRN#: [id number] Attending Physician: Kerney Elbe, DO Primary Care Physician: Mosie Lukes, MD Admit Date: 04/10/2021  Reason for Consultation/Follow-up: Establishing goals of care  Patient Profile/HPI: per initial PM consult by Kathie Rhodes "80 y.o. female  with past medical history of  COPD, OSA on CPAP, CAD s/p CABG, hypertension, PAD, former smoker, history of PE in the past after her CABG, A. fib, chronic diastolic CHF, and hypothyroidism admitted on 04/10/2021 with acute onset of severe right-sided rib cage pain. Patient had recently seen pulmonlolgy on 11/8 for LUL mass and lytic lesion on R 9th rib. CT chest showed left upper lobe with bland thrombus versus tumor thrombus, large area of consolidation in the left upper lobe with lymphangitic spread, right posterior ninth rib lesion with pathological fracture.  Admitted for large left upper lobe mass, suspecting primary lung cancer with acute pulmonary embolism/possible tumor thrombus, pathological ninth rib fracture, postobstructive pneumonia, possible COPD exacerbation and acute on chronic hypoxic respiratory failure. Hospitalization has been complicated by worsening respiratory failure requiring HFNC. Patient underwent CT-guided needle biopsy by IR on 04/15/2021."  Bx confirmed adenocarcinoma. Oncology has consulted as well. She is not a candidate for system treatment.   Subjective: Met with patient. Awake, alert, oriented. Visibly short of breath. She is ready to transition to full comfort measures only. She wishes to seek placement at the Hospice facility in [place]. She has spoken with her son and let him know her wishes. She  is in charge of making her own decisions. Her hope is to die comfortably and not to struggle. She is tired of suffering.  Discussed starting morphine infusion for comfort and she agrees.    Physical Exam Vitals and nursing note reviewed.  Constitutional:      Appearance: She is ill-appearing.  Pulmonary:     Comments: Increased effort Neurological:     Mental Status: She is alert and oriented to person, place, and time.  Psychiatric:        Mood and Affect: Mood normal.        Thought Content: Thought content normal.        Judgment: Judgment normal.  Vital Signs: BP 137/60 (BP Location: Right Arm)   Pulse 74   Temp 98.2 F (36.8 C) (Oral)   Resp 20   Ht 5' 2"  (1.575 m)   Wt 54.8 kg   SpO2 100%   BMI 22.10 kg/m  SpO2: SpO2: 100 % O2 Device: O2 Device: (S) High Flow Nasal Cannula ([place].) O2 Flow Rate: O2 Flow Rate (L/min): 15 L/min  Intake/output summary:  Intake/Output Summary (Last 24 hours) at 04/24/2021 1632 Last data filed at 04/24/2021 0400 Gross per 24 hour  Intake 408.89 ml  Output --  Net 408.89 ml   LBM: Last BM Date: 04/21/21 Baseline Weight: Weight: 56.7 kg Most recent weight: Weight: 54.8 kg       Palliative Assessment/Data: PPS: 30%    Flowsheet Rows    Flowsheet Row Most Recent Value  Intake Tab   Referral Department Hospitalist  Unit at Time of Referral Cardiac/Telemetry Unit  Palliative Care Primary Diagnosis Cancer  Date Notified 04/17/21  Palliative Care Type New Palliative care  Reason for referral Clarify Goals of Care  Date of Admission 04/10/21  Date first seen by Palliative Care 04/17/21  # of days Palliative referral response time 0 Day(s)  # of days IP prior to Palliative referral 7  Clinical Assessment   Psychosocial & Spiritual Assessment   Palliative Care Outcomes          Palliative Care Assessment & Plan    Assessment/Recommendations/Plan  Transition to full comfort care only Morphine infusion at  48m/hr with 268mq15 min prn dosing for SOB or any discomfort Other comfort medications as ordered  Code Status: DNR  Prognosis:  < 2 weeks  Discharge Planning: Hospice facility  Care plan was discussed with patient and care team.   Thank you for allowing the Palliative Medicine Team to assist in the care of this patient.  Total time:  40 mins Prolonged billing: No     Greater than 50%  of this time was spent counseling and coordinating care related to the above assessment and plan.  KaMariana KaufmanAGNP-C Palliative Medicine   Please contact Palliative Medicine Team phone at [phone] questions and concerns.

## 2021-04-24 NOTE — Plan of Care (Signed)

## 2021-04-24 NOTE — Progress Notes (Signed)
Mobility Specialist Progress Note    04/24/21 1050  Mobility  Activity Refused mobility   Pt stated she would like to save her energy for PT.  Hildred Alamin Mobility Specialist  M.S. Primary Phone: [phone] M.S. Secondary Phone: [phone]

## 2021-04-24 NOTE — Progress Notes (Addendum)
04/24/2021   I have seen and evaluated the patient for COPD and advanced cancer.  S:  On 15L, winded with any exertion.  O: Blood pressure 137/60, pulse 74, temperature 98.2 F (36.8 C), temperature source Oral, resp. rate 20, height 5\' 2"  (1.575 m), weight 54.8 kg, SpO2 100 %.  Frail, no distress Lung sounds absent on L Ext with muscle wasting, no edema  No new chest imaging  A:  Stage IV lung cancer newly diagnosed not currently candidate for therapy due to debility and breathing status Tumor thrombus vs. PE Advanced COPD at baseline [place] elderly approaching end of life L pleural effusion on side of cancer, path pending; some benefit she thinks to draining DNR  P:  - Recheck CXR, f/u pleural fluid path - May do another tap to definitively see if helps breathing; if it does may benefit from palliative pleurX depending on GOC - Await patient decision on transition to comfort care/ hospice - Wean O2 for sats > 90% as able - Continue nebs as ordered  Erskine Emery MD [facility] Prefer epic messenger for cross cover needs If after hours, please call E-link

## 2021-04-24 NOTE — Progress Notes (Signed)
ANTICOAGULATION CONSULT NOTE - Follow Up Consult  Pharmacy Consult for Heparin Indication: pulmonary embolus  Allergies  Allergen Reactions   Erythromycin Other (See Comments)    Made the patient's tongue "burn"   Tape Rash and Other (See Comments)    PLEASE USE PAPER TAPE!!   Meperidine Nausea And Vomiting   Shellfish Allergy Nausea And Vomiting   Ciprofloxacin Rash and Other (See Comments)    Rash from IV   Codeine Nausea And Vomiting   Latex Rash and Other (See Comments)    Made the patient's skin "burn," also   Penicillins Rash and Other (See Comments)    Has patient had a PCN reaction causing immediate rash, facial/tongue/throat swelling, SOB or lightheadedness with hypotension: Unk Has patient had a PCN reaction causing severe rash involving mucus membranes or skin necrosis: No Has patient had a PCN reaction that required hospitalization: No Has patient had a PCN reaction occurring within the last 10 years: No If all of the above answers are "NO", then may proceed with Cephalosporin use.     Patient Measurements: Height: 5\' 2"  (157.5 cm) Weight: 54.8 kg (120 lb 13 oz) IBW/kg (Calculated) : 50.1 Heparin Dosing Weight: 56.7 kg  Vital Signs: Temp: 98.2 F (36.8 C) (11/30 0407) Temp Source: Oral (11/30 0407) Pulse Rate: 74 (11/30 0054)  Labs: Recent Labs    04/22/21 0203 04/23/21 0041 04/24/21 0210  HGB 10.6* 10.0* 10.6*  HCT 35.1* 33.6* 35.5*  PLT 203 170 154  HEPARINUNFRC 0.54 0.56 0.60  CREATININE 0.79  --  0.63     Estimated Creatinine Clearance: 42.9 mL/min (by C-G formula based on SCr of 0.63 mg/dL).   Medications:  Scheduled:   cyclobenzaprine  5 mg Oral TID   diltiazem  240 mg Oral Daily   docusate sodium  100 mg Oral BID   dofetilide  125 mcg Oral BID   FLUoxetine  40 mg Oral Daily   ipratropium-albuterol  3 mL Nebulization BID   levothyroxine  75 mcg Oral Daily   lidocaine  1 patch Transdermal Q24H   lidocaine  1 patch Transdermal Q24H    mouth rinse  15 mL Mouth Rinse BID   methylPREDNISolone (SOLU-MEDROL) injection  40 mg Intravenous Q24H   multivitamin with minerals  1 tablet Oral Daily   nystatin  5 mL Oral QID   pantoprazole  40 mg Oral Daily   rosuvastatin  40 mg Oral Daily   senna  2 tablet Oral BID   sodium chloride flush  3 mL Intravenous Q12H   Infusions:   heparin Stopped (04/24/21 0359)    Assessment: 80 yo F found to have PE on CT scan. Patient on Eliquis PTA for afib - (last dose 11/15 PM). Heparin initiated but switched to enoxaparin 11/17 given PE treatment in active cancer patient. Decision made to switch back to heparin in preparation for biopsy. Biopsy done 11/21, patient had hemoptysis and heparin was held briefly, heparin resumed 11/22 AM. Pharmacy consulted to dose heparin.   Heparin level today is therapeutic, CBC is stable, ongoing discussions re: goals of care. Pulm following, may require more procedures.  Goal of Therapy:  Heparin level 0.3-0.7 units/ml Monitor platelets by anticoagulation protocol: Yes   Plan:  Continue heparin 1200 units/h Daily heparin level and CBC Need to determine longterm [facility] plan   Arrie Senate, PharmD, BCPS, [facility] Clinical Pharmacist [phone] Please check AMION for all [place] numbers 04/24/2021

## 2021-04-25 DIAGNOSIS — J9611 Chronic respiratory failure with hypoxia: Secondary | ICD-10-CM | POA: Diagnosis not present

## 2021-04-25 MED ORDER — HYDROCOD POLST-CPM POLST ER 10-8 MG/5ML PO SUER
5.0000 mL | Freq: Two times a day (BID) | ORAL | Status: DC
  Administered 2021-04-25 (×2): 5 mL via ORAL
  Filled 2021-04-25 (×2): qty 5

## 2021-04-25 MED ORDER — LORAZEPAM 2 MG/ML IJ SOLN: 1.0000 mg | INTRAMUSCULAR | Status: DC | PRN

## 2021-04-25 MED ORDER — ALBUTEROL SULFATE (2.5 MG/3ML) 0.083% IN NEBU: 2.5000 mg | INHALATION_SOLUTION | RESPIRATORY_TRACT | Status: DC | PRN

## 2021-04-25 NOTE — Progress Notes (Signed)
Morphine drip discontinued prior to pt being discharged, 80 mL remaining was wasted in the pyxis' stericycle with Chanda Busing, RN.   Elaina Hoops, RN

## 2021-04-25 NOTE — Progress Notes (Signed)
Daily Progress Note   Patient Name: Andrea [name]       Date: 04/25/2021 DOB: Dec 16, 1940  Age: 80 y.o. MRN#: [id number] Attending Physician: Kerney Elbe, DO Primary Care Physician: Mosie Lukes, MD Admit Date: 04/10/2021  Reason for Consultation/Follow-up: Establishing goals of care  Patient Profile/HPI: per initial PM consult by Kathie Rhodes "80 y.o. female  with past medical history of  COPD, OSA on CPAP, CAD s/p CABG, hypertension, PAD, former smoker, history of PE in the past after her CABG, A. fib, chronic diastolic CHF, and hypothyroidism admitted on 04/10/2021 with acute onset of severe right-sided rib cage pain. Patient had recently seen pulmonlolgy on 11/8 for LUL mass and lytic lesion on R 9th rib. CT chest showed left upper lobe with bland thrombus versus tumor thrombus, large area of consolidation in the left upper lobe with lymphangitic spread, right posterior ninth rib lesion with pathological fracture.  Admitted for large left upper lobe mass, suspecting primary lung cancer with acute pulmonary embolism/possible tumor thrombus, pathological ninth rib fracture, postobstructive pneumonia, possible COPD exacerbation and acute on chronic hypoxic respiratory failure. Hospitalization has been complicated by worsening respiratory failure requiring HFNC. Patient underwent CT-guided needle biopsy by IR on 04/15/2021."  Bx confirmed adenocarcinoma. Oncology has consulted as well. She is not a candidate for system treatment.   Subjective: Patient awake, alert. Breathing appears less labored- but she reports feeling uncomfortable. She accepted a bolus dose of morphine from the infusion.  Son, Octavia Bruckner is at bedside.  Awaiting available bed at hospice house in [place].    Physical  Exam Vitals and nursing note reviewed.  Cardiovascular:     Rate and Rhythm: Normal rate.  Pulmonary:     Effort: Pulmonary effort is normal.     Comments: Increased effort Neurological:     Mental Status: She is alert and oriented to person, place, and time.  Psychiatric:        Mood and Affect: Mood normal.        Thought Content: Thought content normal.        Judgment: Judgment normal.            Vital Signs: BP 137/60 (BP Location: Right Arm)   Pulse 74   Temp 98.2 F (36.8 C) (Oral)   Resp 20   Ht 5'  2" (1.575 m)   Wt 54.8 kg   SpO2 100%   BMI 22.10 kg/m  SpO2: SpO2: 100 % O2 Device: O2 Device: High Flow Nasal Cannula O2 Flow Rate: O2 Flow Rate (L/min): (S) 8 L/min (Decreased for comfort care measures.)  Intake/output summary:  Intake/Output Summary (Last 24 hours) at 04/25/2021 1220 Last data filed at 04/25/2021 0700 Gross per 24 hour  Intake --  Output 950 ml  Net -950 ml    LBM: Last BM Date: 04/21/21 Baseline Weight: Weight: 56.7 kg Most recent weight: Weight: 54.8 kg       Palliative Assessment/Data: PPS: 30%      Palliative Care Assessment & Plan    Assessment/Recommendations/Plan  Continue comfort orders Please give bolus doses of morphine if patient is SOB, can increase infusion rate if frequent boluses are needed Will add lorazepam 1mg  IV q4hr prn  Code Status: DNR  Prognosis:  < 2 weeks  Discharge Planning: Hospice facility  Care plan was discussed with patient and care team.   Thank you for allowing the Palliative Medicine Team to assist in the care of this patient.  Total time:  35 mins Prolonged billing: No     Greater than 50%  of this time was spent counseling and coordinating care related to the above assessment and plan.  Mariana Kaufman, AGNP-C Palliative Medicine   Please contact Palliative Medicine Team phone at [phone] for questions and concerns.

## 2021-04-25 NOTE — TOC Initial Note (Signed)
Transition of Care [facility]) - Initial/Assessment Note    Patient Details  Name: Andrea [name] MRN: [id number] Date of Birth: 1941-02-18  Transition [facility]) CM/SW Contact:    Bethena Roys, RN Phone Number: 04/25/2021, 2:13 PM  Clinical Narrative:  Case Manager received a secure chat that the patient is eligible for the [facility]. Son is completing the paperwork and [facility] has stated that [facility] can be notified to pick the patient up at 6:00 pm for transport home. CSW is assisting with transport. Case Manager did call the son Christia Reading to make him aware that the patient will transition via ambulance and the scheduled time asked for pickup is 1800. No further needs identified by Case Manager at this time.                  Expected Discharge Plan: [place] Barriers to Discharge: No Barriers Identified   Patient Goals and CMS Choice Patient states their goals for this hospitalization and ongoing recovery are:: to return home with granddaughter   Choice offered to / list presented to : Adult Children (Family discussed with palliative and they chose [facility].)  Expected Discharge Plan and Services Expected Discharge Plan: [place] In-house Referral: NA Discharge Planning Services: CM Consult Post Acute Care Choice: Hospice Living arrangements for the past 2 months: Single Family Home                 DME Arranged: N/A DME Agency: NA       HH Arranged: PT, OT HH Agency: [place] Date HH Agency Contacted: 04/15/21 Time [place]: 1242 Representative spoke with at [place]: Tommi Rumps  Prior Living Arrangements/Services Living arrangements for the past 2 months: [place] with:: Self, Relatives Patient language and need for interpreter reviewed:: Yes Do you feel safe going back to the place where you live?: Yes      Need for Family Participation in  Patient Care: Yes (Comment) Care giver support system in place?: Yes (comment)   Criminal Activity/Legal Involvement Pertinent to Current Situation/Hospitalization: No - Comment as needed  Activities of Daily Living Home Assistive Devices/Equipment: Oxygen ADL Screening (condition at time of admission) Patient's cognitive ability adequate to safely complete daily activities?: Yes Is the patient deaf or have difficulty hearing?: No Does the patient have difficulty seeing, even when wearing glasses/contacts?: No Does the patient have difficulty concentrating, remembering, or making decisions?: No Patient able to express need for assistance with ADLs?: Yes Does the patient have difficulty dressing or bathing?: No Independently performs ADLs?: Yes (appropriate for developmental age) Does the patient have difficulty walking or climbing stairs?: Yes Weakness of Legs: Both Weakness of Arms/Hands: Both  Permission Sought/Granted Permission sought to share information with : Facility Sport and exercise psychologist, Tourist information centre manager, Family Supports Permission granted to share information with : Yes, Verbal Permission Granted     Permission granted to share info w AGENCY: [place].        Emotional Assessment Appearance:: Appears stated age Attitude/Demeanor/Rapport: Engaged Affect (typically observed): Appropriate Orientation: : Oriented to Situation, Oriented to  Time, Oriented to Place, Oriented to Self Alcohol / Substance Use: Not Applicable Psych Involvement: No (comment)  Admission diagnosis:  Lung mass [R91.8] Pulmonary embolism (HCC) [I26.99] Other acute pulmonary embolism, unspecified whether acute cor pulmonale present [facility]) [I26.99] Patient Active Problem List   Diagnosis Date Noted   Malnutrition of moderate degree 04/13/2021  Pulmonary embolism ([place]) 04/11/2021   Pathological fracture of one rib 04/11/2021   Microcytic anemia 04/11/2021   Lung mass 03/05/2021    Secondary hypercoagulable state ([place]) 10/07/2019   Cor pulmonale, chronic (HCC) 08/17/2019   Chronic diastolic CHF (congestive heart failure) ([place]) 08/17/2019   COPD (chronic obstructive pulmonary disease) ([place]) 08/14/2019   Chronic respiratory failure with hypoxia ([place]) 08/12/2019   Atrial fibrillation, chronic (HCC) 08/12/2019   OSA (obstructive sleep apnea) 08/12/2019   Vitamin D deficiency 06/13/2019   Witnessed episode of apnea 03/07/2018   Cervical cancer screening 12/09/2017   Preventative health care 12/09/2017   Chronic ethmoidal sinusitis 09/04/2017   Hypothyroidism 09/30/2016   Presbycusis of both ears 08/05/2016   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) 06/17/2016   Hx of CABG 12/27/2015   Hyperglycemia 10/23/2015   Unstable angina (HCC) 09/25/2015   Upper airway cough syndrome 03/23/2015   Cystitis 04/11/2014   Baker's cyst of knee 01/30/2014   Thoracic back pain 01/24/2014   Breast cancer screening 07/31/2013   Dyspnea 05/09/2013   Anosmia    Fatigue 09/17/2011   CAD (coronary artery disease) 06/06/2011   NSTEMI (non-ST elevated myocardial infarction) ([place]) 04/22/2011   Excessive somnolence disorder 12/03/2010   BACK PAIN, LUMBAR 07/16/2010   SINUSITIS, CHRONIC 05/07/2010   Cough 09/10/2009   SKIN CANCER, HX OF 04/03/2009   Coronary artery disease involving coronary bypass graft of native heart with angina pectoris ([place]) 01/24/2009   Carotid artery stenosis 01/24/2009   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   AORTIC ANEUR [place] WITHOUT MENTION RUPTURE 01/24/2009   Hyperlipidemia 12/22/2006   Depression, recurrent ([place]) 12/22/2006   HTN (hypertension) 12/22/2006   PERIPHERAL VASCULAR DISEASE 12/22/2006   GERD 12/22/2006   Osteoporosis 12/22/2006    PCP:  Mosie Lukes, MD Pharmacy:   [facility] [place] [address] Phone: [phone] Fax: [phone]     Social Determinants of Health (SDOH) Interventions    Readmission Risk Interventions Readmission Risk Prevention Plan 04/15/2021  Transportation Screening Complete  PCP or Specialist Appt within 3-5 Days Complete  HRI or Home Care Consult Complete  Social Work Consult for [place] Planning/Counseling Complete  Palliative Care Screening Not Applicable  Medication Review Press photographer) Complete  Some recent data might be hidden

## 2021-04-25 NOTE — Progress Notes (Signed)
PCCM Interval Note  Interval Events: Note transition to comfort care, MSO4 infusion running at 1mg /h Some intermittent dyspnea. She asked for a neb treatment earlier but none ordered.   Vitals:   04/23/21 2013 04/24/21 0054 04/24/21 0407 04/24/21 0751  BP: 137/60     Pulse: 80 74    Resp: 20 18 20    Temp: 97.9 F (36.6 C)  98.2 F (36.8 C)   TempSrc: Oral  Oral   SpO2: 91% 93% 92% 100%  Weight:      Height:      Frail woman, NAD on her O2. No BS on the L, distant on the R. No accessory muscle use Awake and alert, appropriate  LUL adenoCA with spread, c/b L PNA, PE, respiratory failure -agree with transition to hospice care. -no clear role here for repeat thora or pleurx -her goal is to get to residential hospice facility -continue and titrate her morphine -I will add back albuterol for her to have available prn.   Call if we can assist you   Baltazar Apo, MD, PhD 04/25/2021, 11:29 AM [facility] [phone] or if no answer before 7:00PM call [phone] For any issues after 7:00PM please call eLink [phone]

## 2021-04-25 NOTE — Progress Notes (Addendum)
Manufacturing engineer [facility])  UPDATE: patient approved and will be transferred to [facility] [place] today via [facility] at 6pm.   If any questions feel free to call   Thank you for this referral.    Ms. Rennie was referred to [place] by [facility] for [place] at [facility]. Hospice Home notified of request and patient chart is under review.   This liaison will notify team once bed available.   Please call with any hospice questions.  Clementeen Hoof, BSN, [employer] [phone]

## 2021-04-25 NOTE — TOC Transition Note (Signed)
Transition of Care [facility]) - CM/SW Discharge Note   Patient Details  Name: Gaia Gullikson MRN: [id number] Date of Birth: Nov 03, 1940  Transition of Care [facility]) CM/SW Contact:  Milas Gain, Ryan Phone Number: 04/25/2021, 2:51 PM   Clinical Narrative:     Patient will DC to: [facility]  Anticipated DC date: 04/25/2021  Family notified: Christia Reading   Transport by: Corey Harold  ?  Per MD patient ready for DC to [facility] . RN, patient, patient's family, and facility notified of DC.  DC packet on chart. DNR signed by MD attached to patients hard chart.Ambulance transport requested for patient.  CSW signing off.   Final next level of care: Home w Hospice Care Barriers to Discharge: No Barriers Identified   Patient Goals and CMS Choice Patient states their goals for this hospitalization and ongoing recovery are:: to return home with granddaughter   Choice offered to / list presented to : Adult Children (Family discussed with palliative and they chose [facility].)  Discharge Placement                       Discharge Plan and Services In-house Referral: NA Discharge Planning Services: CM Consult Post Acute Care Choice: Hospice          DME Arranged: N/A DME Agency: NA       HH Arranged: PT, OT HH Agency: [place] Date [place]: 04/15/21 Time [place]: 1242 Representative spoke with at [place]: [place] ([place]) Interventions     Readmission Risk Interventions Readmission Risk Prevention Plan 04/15/2021  Transportation Screening Complete  PCP or Specialist Appt within 3-5 Days Complete  HRI or [place] Complete  Social Work Consult for [place] Planning/Counseling Complete  Palliative Care Screening Not Applicable  Medication Review Press photographer) Complete  Some recent data might be hidden

## 2021-04-25 NOTE — Discharge Summary (Signed)
Physician Discharge Summary  Andrea [name] MPN:[id number] DOB: June 16, 1940 DOA: 04/10/2021  PCP: Mosie Lukes, MD  Admit date: 04/10/2021 Discharge date: 04/25/2021  Admitted From: Home Disposition: Residential Hospice   Recommendations for Outpatient Follow-up:  Further care per Hospice Protocol   Home Health: No  Equipment/Devices: None    Discharge Condition: Guarded  CODE STATUS: DO NOT RESUSCITATE   Diet recommendation:   Brief/Interim Summary: The patient is an 80 year old Caucasian female with a past medical history significant for but not limited to COPD, OSA on CPAP, CAD status post CABG, hypertension, PAD, history of former smoker and tobacco abuse, history of PE in the past after a CABG, atrial fibrillation, chronic diastolic CHF, hypothyroidism, as well as other comorbidities who recently saw her pulmonologist Dr. Lamonte Sakai on 04/02/2021 for left upper lobe mass and lytic lesion on the posterior right ninth rib identified on the CT scan on 03/07/2021 which was significantly hypermetabolic on the PET scan on 03/21/2021 with an outpatient plan biopsy on 04/15/2021 now admitted on 04/10/2021 with complaints of acute onset of severe right-sided rib cage pain worse with coughing and dyspnea.  CT of the chest showed a left upper lobe with bland thrombus versus tumor thrombus and a large area of consolidation left upper lobe with lymphangitic spread, right posterior ninth rib lesion with pathological fracture.  Was admitted for a large left upper lobe mass and biopsy now confirms adenocarcinoma/non-small cell lung cancer with acute pulmonary embolus/possible tumor thrombus, pathological answered fracture, postobstructive pneumonia, possible COPD exacerbation with acute on chronic hypoxic respiratory failure.  Her hospital course of been complicated by progressive acute respiratory failure and hypoxia requiring high flow nasal cannula.  PCCM has been consulted and she is status post  thoracentesis on 04/19/2021 yielding 400 mL with little improvement in her dyspnea or hypoxia.  Palliative care has been consulted for goals of care and she will need to follow-up in oncology was consulted and confirmed that she is not a candidate for therapy.  After further goals of care discussion patient now elected to transition to full comfort care with the goal of going to residential hospice in [place].  She was initiated on morphine infusion with 1 mg/h with 2 mg every 15 minutes as needed dosing for any shortness of breath or discomfort.  The current plan is to discharge to hospice facility however if she further decompensates or worsens in hospital death may happen.    Discharge Diagnoses:  Principal Problem:   Pulmonary embolism (HCC) Active Problems:   CAD (coronary artery disease)   Hypothyroidism   Chronic respiratory failure with hypoxia (HCC)   Atrial fibrillation, chronic (HCC)   COPD (chronic obstructive pulmonary disease) (HCC)   Chronic diastolic CHF (congestive heart failure) (HCC)   Lung mass   Pathological fracture of one rib   Microcytic anemia   Malnutrition of moderate degree    Acute on chronic respiratory failure with hypoxia: Multifactorial inclusive of large left upper lobe NSCLC with lymphangitic spread, postobstructive pneumonia, suspected parapneumonic effusion (PMN predominance), pulmonary embolism, COPD exacerbation.   - Continue HFNC supplementation. Down to 15L O2 at rest, 2L is baseline.  -Very difficult to wean her off of 15 L with continued shortness of breath cynical after further goals of care discussion with palliative the patient made the decision to elect 82 for comfort and all medications not in the patient's comfort have been discontinued with plans for residential hospice in [place] today    Non-small cell lung cancer/adenocarcinoma, complicated by  right posterior ninth rib fracture with chest wall pain, lymphangitic spread to LUL, hemoptysis  (resolved), postobstructive pneumonia and pleural effusion: - CT-guided needle biopsy by IR on 04/15/2021.  Pathology confirmed adenocarcinoma.  - Oncology input appreciated.  Metastatic disease, lung cancer not curable.  Given high O2 demands, poor performance status, not candidate for systemic treatment.  Recommended comfort based approach and referral to hospice. Pt is in the process of making final arrangements, does not currently opt for full comfort measures.  - Palliative care follow-up appreciated. Will revisit goals of care with patient after a couple more days of aggressive treatment.  - PCCM following.  S/p left thoracentesis 11/25 yielding only 400 mL with little relief of her dyspnea or hypoxia, therefore pleurx unlikely to be helpful. -She is now full comfort care and transitioning to Residential Hospice in [place]    Left upper lobe PE (bland thrombus versus tumor thrombus) -No RV strain on echo.  -Was on anticoagulation with a heparin drip but have now discontinued this given that she is going to hospice  Suspected left upper lobe postobstructive pneumonia: - Continued empiric ceftriaxone x10 days given her lack of improvement and after further goals of care discussion she will be transitioned to hospice  1 cm right upper lobe subpleural nodule: -Will not follow this in the outpatient given that she is being transitioned to hospice  COPD exacerbation: - Continued steroids, antibiotics, inhaled steroids but this has now been discontinued given that she has been transitioned to hospice   Microcytic anemia:  -May be related to anemia of chronic disease or iron deficiency anemia. Stable.  - Work up depending on goals of care discussions going forward and we will not further work this up given that she is being transitioned to full comfort care.   PAF: -NSR currently. -We have discontinued her dofetilide, diltiazem and IV heparin given that she has been transitioned to comfort  care and anticipate discharging to a residential hospice  Chronic HFpEF:  -Euvolemic currently. 2D echo showed LVEF of 50-55%, moderate hypokinesis of left ventricle based mid inferior wall and inferior septal wall. -She is now being transitioned to hospice all the medications not in patient's comfort have been discontinued  CAD: -No angina at this time.  - Holding nitrate due to hypotension.  - On statin but as below has been discontinued  Hyperlipidemia -Continue statin but this is now been discontinued given that the goals of care now shifted towards comfort  Oral thrush: Improved. - Continued nystatin swish and swallow.    Depression - Continued fluoxetine for now as she is being transitioned to hospice  Hypothyroidism: -Last TSH wnl - Continue synthroid patient has elected to go to hospice and will be transitioned to full comfort measures with expectation in anticipation to be discharged to a residential hospice facility   Moderate protein-calorie malnutrition: -Estimated body mass index is 22.1 kg/m as calculated from the following:   Height as of this encounter: 5\' 2"  (1.575 m).   Weight as of this encounter: 54.8 kg. - MVM, supplement protein as able w/boost breeze   Generalized pain: -Multifactorial.  - Lidoderm patches - C/w Morphine.   Anxiety:  - Xanax prn, fluoxetine daily now stopepd    Discharge Instructions  Discharge Instructions     Call MD for:  difficulty breathing, headache or visual disturbances   Complete by: As directed    Call MD for:  extreme fatigue   Complete by: As directed    Call  MD for:  hives   Complete by: As directed    Call MD for:  persistant dizziness or light-headedness   Complete by: As directed    Call MD for:  persistant nausea and vomiting   Complete by: As directed    Call MD for:  redness, tenderness, or signs of infection (pain, swelling, redness, odor or green/yellow discharge around incision site)   Complete by: As  directed    Call MD for:  severe uncontrolled pain   Complete by: As directed    Call MD for:  temperature >100.4   Complete by: As directed    Diet - low sodium heart healthy   Complete by: As directed    Discharge instructions   Complete by: As directed    Further Care per Hospice Protocol   Increase activity slowly   Complete by: As directed    No wound care   Complete by: As directed       Allergies as of 04/25/2021       Reactions   Erythromycin Other (See Comments)   Made the patient's tongue "burn"   Tape Rash, Other (See Comments)   PLEASE USE PAPER TAPE!!   Meperidine Nausea And Vomiting   Shellfish Allergy Nausea And Vomiting   Ciprofloxacin Rash, Other (See Comments)   Rash from IV   Codeine Nausea And Vomiting   Latex Rash, Other (See Comments)   Made the patient's skin "burn," also   Penicillins Rash, Other (See Comments)   Has patient had a PCN reaction causing immediate rash, facial/tongue/throat swelling, SOB or lightheadedness with hypotension: Unk Has patient had a PCN reaction causing severe rash involving mucus membranes or skin necrosis: No Has patient had a PCN reaction that required hospitalization: No Has patient had a PCN reaction occurring within the last 10 years: No If all of the above answers are "NO", then may proceed with Cephalosporin use.        Medication List     STOP taking these medications    acetaminophen 650 MG CR tablet Commonly known as: TYLENOL   albuterol (2.5 MG/3ML) 0.083% nebulizer solution Commonly known as: PROVENTIL   albuterol (5 MG/ML) 0.5% nebulizer solution Commonly known as: PROVENTIL   albuterol 108 (90 Base) MCG/ACT inhaler Commonly known as: VENTOLIN HFA   azelastine 0.1 % nasal spray Commonly known as: ASTELIN   Biotin 5 MG Tabs   CALCIUM + D PO   clopidogrel 75 MG tablet Commonly known as: PLAVIX   diltiazem 240 MG 24 hr capsule Commonly known as: CARDIZEM CD   diltiazem 30 MG  tablet Commonly known as: Cardizem   dofetilide 125 MCG capsule Commonly known as: TIKOSYN   Eliquis 2.5 MG Tabs tablet Generic drug: apixaban   ezetimibe 10 MG tablet Commonly known as: ZETIA   ferrous fumarate 325 (106 Fe) MG Tabs tablet Commonly known as: HEMOCYTE - 106 mg FE   FLUoxetine 40 MG capsule Commonly known as: PROzac   folic acid 1 MG tablet Commonly known as: FOLVITE   furosemide 20 MG tablet Commonly known as: LASIX   HYDROcodone-acetaminophen 10-325 MG tablet Commonly known as: NORCO   ipratropium 0.02 % nebulizer solution Commonly known as: ATROVENT   isosorbide mononitrate 60 MG 24 hr tablet Commonly known as: IMDUR   Krill Oil 500 MG Caps   levothyroxine 75 MCG tablet Commonly known as: SYNTHROID   nitroGLYCERIN 0.4 MG SL tablet Commonly known as: NITROSTAT   omeprazole 20 MG capsule  Commonly known as: PRILOSEC   ondansetron 4 MG tablet Commonly known as: Zofran   potassium chloride 10 MEQ tablet Commonly known as: KLOR-CON   rosuvastatin 40 MG tablet Commonly known as: CRESTOR   SYSTANE OP   Trelegy Ellipta 100-62.5-25 MCG/ACT Aepb Generic drug: Fluticasone-Umeclidin-Vilant   Vitamin D3 50 MCG (2000 UT) Tabs   zinc gluconate 50 MG tablet        Follow-up Information     [facility]   Specialty: Hospice and Palliative Medicine Why: [place]. Contact information: [place] [address] [phone]                Allergies  Allergen Reactions   Erythromycin Other (See Comments)    Made the patient's tongue "burn"   Tape Rash and Other (See Comments)    PLEASE USE PAPER TAPE!!   Meperidine Nausea And Vomiting   Shellfish Allergy Nausea And Vomiting   Ciprofloxacin Rash and Other (See Comments)    Rash from IV   Codeine Nausea And Vomiting   Latex Rash and Other (See Comments)    Made the patient's skin "burn," also    Penicillins Rash and Other (See Comments)    Has patient had a PCN reaction causing immediate rash, facial/tongue/throat swelling, SOB or lightheadedness with hypotension: Unk Has patient had a PCN reaction causing severe rash involving mucus membranes or skin necrosis: No Has patient had a PCN reaction that required hospitalization: No Has patient had a PCN reaction occurring within the last 10 years: No If all of the above answers are "NO", then may proceed with Cephalosporin use.    Consultations: Medical oncology PCCM/pulmonary Interventional radiology Palliative care medicine  Procedures/Studies: DG Chest 1 View  Result Date: 04/15/2021 CLINICAL DATA:  Dyspnea EXAM: CHEST  1 VIEW COMPARISON:  04/15/2021 FINDINGS: Single frontal view of the chest demonstrates persistent left upper lobe mass with suspected lymphangitic spread of disease throughout the left upper lobe. Emphysematous changes are seen throughout the right lung. No effusion or pneumothorax. Erosive change left posterolateral fifth rib consistent with direct invasion by the adjacent left upper lobe mass as per recent CT. IMPRESSION: 1. Stable left upper lobe mass and lymphangitic spread. 2. Stable left fifth rib erosive change consistent with direct invasion of tumor. The right posterior ninth rib lytic lesion seen on CT is not readily apparent by x-ray. Electronically Signed   By: Randa Ngo M.D.   On: 04/15/2021 19:48   DG Chest 1 View  Result Date: 04/14/2021 CLINICAL DATA:  Shortness of breath. EXAM: CHEST  1 VIEW COMPARISON:  CTA Chest 04/11/2021.  Chest x-ray 04/10/2021 FINDINGS: Interval progression of airspace consolidation in the left upper lobe, now with probable involvement in the left lower lobe and potential layering left pleural effusion. Right lung is clear. Heart size stable. Bones are diffusely demineralized. Telemetry leads overlie the chest. IMPRESSION: Interval progression of left upper and lower lobe  airspace disease with associated left upper lobe mass, better demonstrated on recent chest CT. Features may reflect progressive infection or worsening postobstructive etiology. Electronically Signed   By: Misty Stanley M.D.   On: 04/14/2021 07:40   CT Angio Chest PE W and/or Wo Contrast  Result Date: 04/11/2021 CLINICAL DATA:  Concern for pulmonary embolism.  Left lung mass. EXAM: CT ANGIOGRAPHY CHEST WITH CONTRAST TECHNIQUE: Multidetector CT imaging of the chest was performed using the standard protocol during bolus administration of intravenous contrast. Multiplanar  CT image reconstructions and MIPs were obtained to evaluate the vascular anatomy. CONTRAST:  58ml of Omni 350 COMPARISON:  Chest radiograph dated 04/10/2021 and head CT dated 03/21/2021 FINDINGS: Cardiovascular: There is cardiomegaly. No pericardial effusion. There is coronary vascular calcification and postsurgical changes of CABG. Retrograde flow of contrast from the right atrium into the IVC suggestive of right heart dysfunction. Moderate atherosclerotic calcification of the thoracic aorta. There is linear nearly occlusive thrombus in the left upper lobe pulmonary artery branch. This may represent a bland thrombus versus tumor thrombus. No CT evidence of right heart stranding. Mediastinum/Nodes: Evaluation of the left hilum is limited due to consolidative changes of the left upper lobe. No mediastinal fluid collection. Lungs/Pleura: Large area of consolidation in the left upper lobe as seen on the PET-CT in keeping with known malignancy with associated lymphangitic spread in the left upper lobe. There is background of emphysema. A 1 cm right upper lobe subpleural nodule. No significant pleural effusion. No pneumothorax. The central airways are patent. Upper Abdomen: Small hiatal hernia. Musculoskeletal: A 17 mm lytic lesion involving the posterior right ninth rib with associated pathologic fracture. Median sternotomy wires. Review of the MIP  images confirms the above findings. IMPRESSION: 1. Left upper lobe bland thrombus versus tumor thrombus. No CT evidence of right heart stranding. 2. Large area of consolidation in the left upper lobe in keeping with known malignancy with associated lymphangitic spread in the left upper lobe. 3. Right posterior ninth rib lytic lesion with associated pathologic fracture. 4. Cardiomegaly with evidence of right heart dysfunction. 5. A 1 cm right upper lobe subpleural nodule. 6. Aortic Atherosclerosis (ICD10-I70.0) and Emphysema (ICD10-J43.9). These results were called by telephone at the time of interpretation on 04/11/2021 at 1:28 am to Dr. Sedonia Small, who verbally acknowledged these results. Electronically Signed   By: Anner Crete M.D.   On: 04/11/2021 01:31   DG Chest Port 1 View  Result Date: 04/24/2021 CLINICAL DATA:  Effusion, shortness of breath and cough. EXAM: PORTABLE CHEST 1 VIEW COMPARISON:  04/20/2019 FINDINGS: Previous median sternotomy. Complete opacification of the left hemithorax without evidence of any aerated lung presently. No pleural fluid seen on the right. Some vascular plethora on the right probably due to collapse of the left lung. IMPRESSION: Complete opacification of the left hemithorax. No aerated lung seen on the left. Electronically Signed   By: Nelson Chimes M.D.   On: 04/24/2021 12:59   DG CHEST PORT 1 VIEW  Result Date: 04/19/2021 CLINICAL DATA:  Post thoracentesis EXAM: PORTABLE CHEST 1 VIEW COMPARISON:  Portable exam 1419 hours compared to 04/17/2021 FINDINGS: Normal heart size post median sternotomy likely CABG. Coronary stent noted. Atherosclerotic calcifications aorta. Subtotal opacification of the LEFT lung with slightly improved aeration and decreased pleural effusion since previous exam. No pneumothorax. RIGHT lung clear. Bones demineralized. IMPRESSION: No pneumothorax following LEFT thoracentesis. Electronically Signed   By: Lavonia Dana M.D.   On: 04/19/2021 14:38    DG CHEST PORT 1 VIEW  Result Date: 04/17/2021 CLINICAL DATA:  Acute respiratory failure with hypoxia. EXAM: PORTABLE CHEST 1 VIEW COMPARISON:  Chest x-ray dated April 15, 2021. FINDINGS: Progressive now complete opacification of the left hemithorax. Emphysematous right lung. No pneumothorax. Stable heart size. No significant mediastinal shift. IMPRESSION: 1. Progressive now complete opacification of the left hemithorax which could reflect large pleural effusion or progressive consolidation/collapse. Electronically Signed   By: Titus Dubin M.D.   On: 04/17/2021 15:57   DG Chest [facility]  Result Date: 04/15/2021 CLINICAL DATA:  Status post biopsy EXAM: PORTABLE CHEST 1 VIEW COMPARISON:  Radiograph 04/14/2021. FINDINGS: Progressive volume loss in the left hemithorax with subtotal opacification. Decreased aeration of the left perihilar and lower lung zone. No visualized pneumothorax. Skin fold projects over the right hemithorax. Patient is post median sternotomy. Heart size and mediastinal contours are obscured. Emphysematous changes are again seen. IMPRESSION: 1. No visualized pneumothorax. 2. Progressive volume loss in the left hemithorax with subtotal opacification. This may be due to combination of progressive atelectasis/collapse and or airspace disease/hemorrhage. 3. Skin fold projects over the right chest. Electronically Signed   By: Keith Rake M.D.   On: 04/15/2021 18:01   DG Chest Port 1 View  Result Date: 04/10/2021 CLINICAL DATA:  Chest pain and hemoptysis. EXAM: PORTABLE CHEST 1 VIEW COMPARISON:  Chest x-ray 03/05/2021.  CT chest 03/07/2021. FINDINGS: Focal masslike opacity measuring 10 cm in the lateral left upper lobe has increased in size. There is increasing diffuse airspace disease and central interstitial opacities. Right lung is clear. There is no pleural effusion or pneumothorax. Again seen are postsurgical changes in the left chest. Sternotomy wires are present.  Cardiomediastinal silhouette is stable, the heart is enlarged. No acute fractures are identified. IMPRESSION: 1. Enlarging left upper lobe mass. 2. New/increasing diffuse left lung airspace disease with interstitial prominence. Findings may related to infection, pneumonia and/or tumor progression. Electronically Signed   By: Ronney Asters M.D.   On: 04/10/2021 21:42   ECHOCARDIOGRAM COMPLETE  Result Date: 04/11/2021    ECHOCARDIOGRAM REPORT   Patient Name:   MIAROSE LIPPERT Date of Exam: 04/11/2021 Medical Rec #:  [id number]     Height:       62.0 in Accession #:    [id number]    Weight:       125.0 lb Date of Birth:  1941-04-15    BSA:          1.566 m Patient Age:    20 years      BP:           146/37 mmHg Patient Gender: F             HR:           74 bpm. Exam Location:  Inpatient Procedure: 2D Echo, Cardiac Doppler and Color Doppler Indications:    Pulmonary Embolus I26.09  History:        Patient has prior history of Echocardiogram examinations, most                 recent 04/24/2020. CHF, CAD and Previous Myocardial Infarction,                 COPD and PAD, Arrythmias:Atrial Fibrillation,                 Signs/Symptoms:Murmur and Shortness of Breath; Risk                 Factors:Sleep Apnea, Hypertension and Dyslipidemia. Chronic                 hypoxic respiratory failure, hypothyroidism, recently identified                 left lung mass, AAA, bradycardia.  Sonographer:    Darlina Sicilian RDCS Referring Phys: [id number] TIMOTHY S OPYD  Sonographer Comments: Image acquisition challenging due to respiratory motion and Image acquisition challenging due to COPD. IMPRESSIONS  1. Left ventricular ejection fraction, by estimation, is 50 to 55%.  The left ventricle has low normal function. The left ventricle demonstrates regional wall motion abnormalities (see scoring diagram/findings for description). Left ventricular diastolic  parameters are indeterminate. There is moderate hypokinesis of the left ventricular,  basal-mid inferior wall and inferoseptal wall.  2. Right ventricular systolic function was not well visualized. The right ventricular size is normal. There is mildly elevated pulmonary artery systolic pressure. The estimated right ventricular systolic pressure is 90.2 mmHg.  3. Left atrial size was moderately dilated.  4. Right atrial size was severely dilated.  5. The mitral valve is degenerative. Mild to moderate mitral valve regurgitation. No evidence of mitral stenosis. Moderate mitral annular calcification.  6. Tricuspid valve regurgitation is moderate to severe.  7. The aortic valve is calcified. There is moderate calcification of the aortic valve. There is moderate thickening of the aortic valve. Aortic valve regurgitation is not visualized. Aortic valve sclerosis/calcification is present, without any evidence of aortic stenosis.  8. Mildly dilated pulmonary artery.  9. The inferior vena cava is normal in size with greater than 50% respiratory variability, suggesting right atrial pressure of 3 mmHg. Comparison(s): Prior images reviewed side by side. Conclusion(s)/Recommendation(s): Difficult images on current and prior study. Overall appears largely similar, but on current images there is concern for basal hypokinesis of the inferior and inferoseptal wall. Would recommend future studies use echo contrast to evaluate more completely. FINDINGS  Left Ventricle: Left ventricular ejection fraction, by estimation, is 50 to 55%. The left ventricle has low normal function. The left ventricle demonstrates regional wall motion abnormalities. Moderate hypokinesis of the left ventricular, basal-mid inferior wall and inferoseptal wall. 3D left ventricular ejection fraction analysis performed but not reported based on interpreter judgement due to suboptimal tracking. The left ventricular internal cavity size was normal in size. There is no left ventricular hypertrophy. Left ventricular diastolic parameters are  indeterminate. Right Ventricle: The right ventricular size is normal. Right vetricular wall thickness was not well visualized. Right ventricular systolic function was not well visualized. There is mildly elevated pulmonary artery systolic pressure. The tricuspid regurgitant velocity is 2.98 m/s, and with an assumed right atrial pressure of 3 mmHg, the estimated right ventricular systolic pressure is 40.9 mmHg. Left Atrium: Left atrial size was moderately dilated. Right Atrium: Right atrial size was severely dilated. Pericardium: There is no evidence of pericardial effusion. Mitral Valve: The mitral valve is degenerative in appearance. There is mild thickening of the mitral valve leaflet(s). There is mild calcification of the mitral valve leaflet(s). Moderate mitral annular calcification. Mild to moderate mitral valve regurgitation. No evidence of mitral valve stenosis. Tricuspid Valve: The tricuspid valve is normal in structure. Tricuspid valve regurgitation is moderate to severe. No evidence of tricuspid stenosis. Aortic Valve: The aortic valve is calcified. There is moderate calcification of the aortic valve. There is moderate thickening of the aortic valve. Aortic valve regurgitation is not visualized. Aortic valve sclerosis/calcification is present, without any  evidence of aortic stenosis. Pulmonic Valve: The pulmonic valve was not well visualized. Pulmonic valve regurgitation is mild. No evidence of pulmonic stenosis. Aorta: The aortic root and ascending aorta are structurally normal, with no evidence of dilitation and the aortic arch was not well visualized. Pulmonary Artery: The pulmonary artery is mildly dilated. Venous: The inferior vena cava is normal in size with greater than 50% respiratory variability, suggesting right atrial pressure of 3 mmHg. IAS/Shunts: The atrial septum is grossly normal.  LEFT VENTRICLE PLAX 2D LVIDd:         5.10  cm   Diastology LVIDs:         4.30 cm   LV e' medial:    8.81  cm/s LV PW:         1.00 cm   LV E/e' medial:  10.5 LV IVS:        1.00 cm   LV e' lateral:   9.03 cm/s LVOT diam:     1.90 cm   LV E/e' lateral: 10.3 LV SV:         43 LV SV Index:   27 LVOT Area:     2.84 cm                           3D Volume EF:                          3D EF:        42 %                          LV EDV:       146 ml                          LV ESV:       85 ml                          LV SV:        61 ml RIGHT VENTRICLE RV S prime:     8.17 cm/s LEFT ATRIUM             Index        RIGHT ATRIUM           Index LA diam:        5.30 cm 3.39 cm/m   RA Area:     25.80 cm LA Vol (A2C):   47.8 ml 30.53 ml/m  RA Volume:   83.60 ml  53.40 ml/m LA Vol (A4C):   50.8 ml 32.45 ml/m LA Biplane Vol: 49.4 ml 31.55 ml/m  AORTIC VALVE LVOT Vmax:   89.90 cm/s LVOT Vmean:  59.200 cm/s LVOT VTI:    0.151 m  AORTA Ao Root diam: 2.80 cm Ao Asc diam:  3.10 cm MITRAL VALVE                  TRICUSPID VALVE MV Area (PHT): 3.72 cm       TR Peak grad:   35.5 mmHg MV Decel Time: 204 msec       TR Vmax:        298.00 cm/s MR Peak grad:    168.5 mmHg MR Mean grad:    104.0 mmHg   SHUNTS MR Vmax:         649.00 cm/s  Systemic VTI:  0.15 m MR Vmean:        483.0 cm/s   Systemic Diam: 1.90 cm MR PISA:         1.01 cm MR PISA Eff ROA: 13 mm MR PISA Radius:  0.40 cm MV E velocity: 92.70 cm/s MV A velocity: 102.00 cm/s MV E/A ratio:  0.91 Buford Dresser MD Electronically signed by Buford Dresser MD Signature Date/Time: 04/11/2021/1:23:04 PM    Final    VAS [place] LOWER EXTREMITY VENOUS (DVT)  Result Date: 04/11/2021  Lower Venous  DVT Study Patient Name:  HAILE TOPPINS  Date of Exam:   04/11/2021 Medical Rec #: [id number]      Accession #:    [id number] Date of Birth: 11-03-1940     Patient Gender: F Patient Age:   20 years Exam Location:  [facility] Procedure:      VAS [place] LOWER EXTREMITY VENOUS (DVT) Referring Phys: TIMOTHY OPYD  --------------------------------------------------------------------------------  Indications: Pulmonary embolism.  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 Comparison Study: No prior study Performing Technologist: Sharion Dove RVS  Examination Guidelines: A complete evaluation includes B-mode imaging, spectral Doppler, color Doppler, and power Doppler as needed of all accessible portions of each vessel. Bilateral testing is considered an integral part of a complete examination. Limited examinations for reoccurring indications may be performed as noted. The reflux portion of the exam is performed with the patient in reverse Trendelenburg.  +---------+---------------+---------+-----------+----------+-------------------+ RIGHT    CompressibilityPhasicitySpontaneityPropertiesThrombus Aging      +---------+---------------+---------+-----------+----------+-------------------+ CFV      Full                                         pulsatile waveforms +---------+---------------+---------+-----------+----------+-------------------+ SFJ      Full                                                             +---------+---------------+---------+-----------+----------+-------------------+ FV Prox  Full                                                             +---------+---------------+---------+-----------+----------+-------------------+ FV Mid   Full                                                             +---------+---------------+---------+-----------+----------+-------------------+ FV DistalFull                                                             +---------+---------------+---------+-----------+----------+-------------------+ PFV      Full                                                             +---------+---------------+---------+-----------+----------+-------------------+ POP      Full           No       No                                        +---------+---------------+---------+-----------+----------+-------------------+  PTV      Full                                                             +---------+---------------+---------+-----------+----------+-------------------+ PERO     Full                                                             +---------+---------------+---------+-----------+----------+-------------------+   +---------+---------------+---------+-----------+----------+-------------------+ LEFT     CompressibilityPhasicitySpontaneityPropertiesThrombus Aging      +---------+---------------+---------+-----------+----------+-------------------+ CFV      Full           No       Yes                  pulsatile flow      +---------+---------------+---------+-----------+----------+-------------------+ SFJ      Full                                                             +---------+---------------+---------+-----------+----------+-------------------+ FV Prox  Full                                                             +---------+---------------+---------+-----------+----------+-------------------+ FV Mid   Full                                                             +---------+---------------+---------+-----------+----------+-------------------+ FV DistalFull                                                             +---------+---------------+---------+-----------+----------+-------------------+ PFV      Full                                                             +---------+---------------+---------+-----------+----------+-------------------+ POP      Full                                         Dilated with  rouleaux flow       +---------+---------------+---------+-----------+----------+-------------------+ PTV      Full                                                              +---------+---------------+---------+-----------+----------+-------------------+ PERO     Full                                                             +---------+---------------+---------+-----------+----------+-------------------+ Gastroc  Full                                         Dilated with                                                              rouleaux flow       +---------+---------------+---------+-----------+----------+-------------------+     Summary: RIGHT: - There is no evidence of deep vein thrombosis in the lower extremity.  LEFT: - There is no evidence of deep vein thrombosis in the lower extremity.  - The popliteal and gastrocnemius veins are easily compressible but are dilated with rouleaux flow  *See table(s) above for measurements and observations. Electronically signed by Orlie Pollen on 04/11/2021 at 7:05:27 PM.    Final    CT LUNG MASS BIOPSY  Result Date: 04/15/2021 INDICATION: 80 year old woman with left upper lobe lung mass presents to IR for CT-guided biopsy EXAM: CT-guided biopsy of left lung mass MEDICATIONS: None. ANESTHESIA/SEDATION: Moderate (conscious) sedation was employed during this procedure. A total of Versed 1 mg and Fentanyl 25 mcg was administered intravenously. Moderate Sedation Time: 10 minutes. The patient's level of consciousness and vital signs were monitored continuously by radiology nursing throughout the procedure under my direct supervision. COMPLICATIONS: SIR Level A - No therapy, no consequence. Moderate hemoptysis PROCEDURE: Informed written consent was obtained from the patient after a thorough discussion of the procedural risks, benefits and alternatives. All questions were addressed. Maximal Sterile Barrier Technique was utilized including caps, mask, sterile gowns, sterile gloves, sterile drape, hand hygiene and skin antiseptic. A timeout was performed prior to the initiation of the procedure. Patient positioned supine on  the CT table. The left anterior chest wall skin prepped and draped in usual fashion. Utilizing CT guidance, 17 gauge introducer needle was advanced into the left upper lobe lung mass and 3-18 gauge cores were obtained. All samples were sent to pathology in formalin. The patient developed moderate hemoptysis after the biopsy requiring supplemental oxygen. Patient's hemoptysis stopped after she was positioned left lateral decubitus. IMPRESSION: CT-guided biopsy of left upper lobe lung mass. Electronically Signed   By: Miachel Roux M.D.   On: 04/15/2021 15:48     Subjective: Seen and examined at bedside and states that she is a little dyspneic today.  No chest pain but continues  to be short of breath.  States that she had a coughing fit which made her worse.  Denies any other concerns or complaints this time and is ready for hospice.  Discharge Exam: Vitals:   04/24/21 0407 04/24/21 0751  BP:    Pulse:    Resp: 20   Temp: 98.2 F (36.8 C)   SpO2: 92% 100%   Vitals:   04/23/21 2013 04/24/21 0054 04/24/21 0407 04/24/21 0751  BP: 137/60     Pulse: 80 74    Resp: 20 18 20    Temp: 97.9 F (36.6 C)  98.2 F (36.8 C)   TempSrc: Oral  Oral   SpO2: 91% 93% 92% 100%  Weight:      Height:       General: Pt is alert, awake, not in acute distress Cardiovascular: RRR, S1/S2 +, no rubs, no gallops Respiratory: Diminished bilaterally, no wheezing, no rhonchi; wearing supplemental oxygen via nasal cannula Abdominal: Soft, NT, ND, bowel sounds + Extremities: no edema, no cyanosis  The results of significant diagnostics from this hospitalization (including imaging, microbiology, ancillary and laboratory) are listed below for reference.    Microbiology: Recent Results (from the past 240 hour(s))  Body fluid culture w Gram Stain     Status: None   Collection Time: 04/19/21  1:38 PM   Specimen: Pleural Fluid  Result Value Ref Range Status   Specimen Description PLEURAL FLUID  Final   Special  Requests PLEURAL  Final   Gram Stain   Final    MODERATE WBC PRESENT, PREDOMINANTLY PMN NO ORGANISMS SEEN    Culture   Final    NO GROWTH 3 DAYS Performed at [facility], [address] [place], [place] [address]    Report Status 04/22/2021 FINAL  Final    Labs: BNP (last 3 results) Recent Labs    04/10/21 2122  BNP 440.1*   Basic Metabolic Panel: Recent Labs  Lab 04/21/21 0150 04/22/21 0203 04/24/21 0210  NA 137 135 133*  K 4.0 4.0 3.8  CL 97* 99 98  CO2 28 28 28   GLUCOSE 116* 141* 157*  BUN 17 20 17   CREATININE 0.80 0.79 0.63  CALCIUM 8.7* 8.5* 8.2*   Liver Function Tests: No results for input(s): AST, ALT, ALKPHOS, BILITOT, PROT, ALBUMIN in the last 168 hours. No results for input(s): LIPASE, AMYLASE in the last 168 hours. No results for input(s): AMMONIA in the last 168 hours. CBC: Recent Labs  Lab 04/20/21 0228 04/21/21 0150 04/22/21 0203 04/23/21 0041 04/24/21 0210  WBC 15.4* 12.2* 11.0* 11.6* 13.6*  HGB 11.2* 11.6* 10.6* 10.0* 10.6*  HCT 36.9 37.9 35.1* 33.6* 35.5*  MCV 75.6* 75.0* 75.2* 76.2* 76.2*  PLT 261 254 203 170 154   Cardiac Enzymes: No results for input(s): CKTOTAL, CKMB, CKMBINDEX, TROPONINI in the last 168 hours. BNP: Invalid input(s): POCBNP CBG: No results for input(s): GLUCAP in the last 168 hours. D-Dimer No results for input(s): DDIMER in the last 72 hours. Hgb A1c No results for input(s): HGBA1C in the last 72 hours. Lipid Profile No results for input(s): CHOL, HDL, LDLCALC, TRIG, CHOLHDL, LDLDIRECT in the last 72 hours. Thyroid function studies No results for input(s): TSH, T4TOTAL, T3FREE, THYROIDAB in the last 72 hours.  Invalid input(s): FREET3 Anemia work up No results for input(s): VITAMINB12, FOLATE, FERRITIN, TIBC, IRON, RETICCTPCT in the last 72 hours. Urinalysis    Component Value Date/Time   COLORURINE YELLOW 06/28/2018 [place] 06/28/2018  1533   LABSPEC 1.020 06/28/2018 1533    PHURINE 5.5 06/28/2018 1533   GLUCOSEU NEGATIVE 06/28/2018 1533   HGBUR NEGATIVE 06/28/2018 1533   [place] 06/28/2018 1533   BILIRUBINUR negative 03/27/2015 [place] 06/28/2018 1533   PROTEINUR negative 03/27/2015 1833   PROTEINUR NEGATIVE 07/15/2014 1548   UROBILINOGEN 0.2 06/28/2018 1533   NITRITE NEGATIVE 06/28/2018 1533   LEUKOCYTESUR NEGATIVE 06/28/2018 1533   Sepsis Labs Invalid input(s): PROCALCITONIN,  WBC,  LACTICIDVEN Microbiology Recent Results (from the past 240 hour(s))  Body fluid culture w Gram Stain     Status: None   Collection Time: 04/19/21  1:38 PM   Specimen: Pleural Fluid  Result Value Ref Range Status   Specimen Description PLEURAL FLUID  Final   Special Requests PLEURAL  Final   Gram Stain   Final    MODERATE WBC PRESENT, PREDOMINANTLY PMN NO ORGANISMS SEEN    Culture   Final    NO GROWTH 3 DAYS Performed at [facility], [place] [address], [place], [place] [address]    Report Status 04/22/2021 FINAL  Final   Time coordinating discharge: 35 minutes  SIGNED:  Kerney Elbe, DO [facility] 04/25/2021, 2:24 PM Pager is on [place]  If 7PM-7AM, please contact night-coverage [email or web address]

## 2021-04-26 LAB — CYTOLOGY - NON PAP

## 2021-05-01 NOTE — Telephone Encounter (Signed)
Looks like all medications have been discontinued by hospitalist and patient may have been referred to palliative care/hospice. Please advise. Thank you!

## 2021-05-07 LAB — MISC LABCORP TEST (SEND OUT): Labcorp test code: 9985

## 2021-05-10 ENCOUNTER — Ambulatory Visit: Payer: Medicare HMO | Admitting: Emergency Medicine

## 2021-05-26 DEATH — deceased

## 2021-06-20 ENCOUNTER — Ambulatory Visit: Payer: Medicare HMO | Admitting: Cardiology

## 2021-06-20 NOTE — Progress Notes (Deleted)
Electrophysiology Office Note   Date:  06/20/2021   ID:  Andrea [name], DOB January 22, 1941, MRN [id number]  PCP:  Mosie Lukes, MD  Cardiologist:  Rockey Situ Primary Electrophysiologist:  Kelsay Haggard Meredith Leeds, MD    No chief complaint on file.    History of Present Illness: Andrea [name] is a 81 y.o. female who is being seen today for the evaluation of SVT at the request of Christell Faith. Presenting today for electrophysiology evaluation.  She has a history of coronary disease status post CABG in 1999 status post PCI to the distal RCA in 2013, PCI x2 to the SVG to the RCA into the mid circumflex in 2017, PVD status post lower extremity stenting, carotid artery disease status post left-sided CEA, pulmonary hypertension, valvular heart disease, COPD, infrarenal AAA, PE, hypertension, hyperlipidemia, asthma, hypothyroidism.  Her a Holter monitor with episodes of SVT/atrial tachycardia, longest lasting 14 beats with frequent PACs and rare PVCs.  She has palpitations on a daily basis.  They last between 5 and 10 minutes at times.  There are no exacerbating or alleviating factors.  Had an EP study on 03/19/2018 which did not induce tachycardia.  She presented to cardiology clinic feeling quite short of breath.  She is now status post cardioversion 10/19/2019.  She went to the atrial fibrillation clinic a few weeks after her cardioversion and unfortunately did not feel much better in sinus rhythm.  Today, denies symptoms of palpitations, chest pain, orthopnea, PND, lower extremity edema, claudication, dizziness, presyncope, syncope, bleeding, or neurologic sequela. The patient is tolerating medications without difficulties.  Over the past few days she has been more short of breath and more bloated.  She has been having to use her inhalers and her Lasix more often.  She has not noted lower extremity edema, but does feel that her abdomen has gotten bigger.  She lost 3 pounds of fluid weight yesterday.  She does have  quite a few comorbidities, but is concerned that her atrial fibrillation is contributing to her symptoms.  She also complains of significant weakness and fatigue.   Past Medical History:  Diagnosis Date   AAA (abdominal aortic aneurysm) 01/2009   AAA (2.8 x 3.0)  moderate RAS (left); 2.7 x 2.7 cm (07/11/05)   Acute bronchitis 03/20/2013; 2017   Angina    Anosmia    Asthma    Baker's cyst of knee 01/30/2014   Basal cell carcinoma    "back and left arm"   Bradycardia    Metoprolol stopped 08/2011   CAD (coronary artery disease)    s/CABG (reports IMA and 2 SVGs) back in 1999  Myoview normal 3/10; s/p PCI with DES to PL branch of distal RCA 09/2011; PCI +DES to SVG-RCA, PCI + DES to mid LCx 12/2015   Cerebrovascular disease 01/2009   carotid u/s  R 0-39%   L 60-79%   Chronic thoracic back pain    COPD (chronic obstructive pulmonary disease) (HCC)    Depression    Dizziness    Dysrhythmia    hx of sinus brady   GERD (gastroesophageal reflux disease)    Heart murmur    Herniated lumbar disc without myelopathy    History of blood transfusion 1999   "when I had the bypass; had a PE"   Hyperglycemia 10/23/2015   Hyperlipidemia    Hypertension    Hypothyroidism 09/30/2016   Medicare annual wellness visit, subsequent 07/31/2013   NSTEMI (non-ST elevated myocardial infarction) [facility]) 11/12   Cath  showed atretic IMA graft to the LAD, SVG to PD was patent but the continuation of this graft to the PL branch was occluded; there were L to R collaterals; Mid LAD had a 60 to 70% stenosis. She has been treated medically.  Neg Myoview 05/2011   Osteoarthritis of back    Osteoporosis    Pneumonia "several times"   Pulmonary embolism ([place]) 1999   "after my bypass"   PVD (peripheral vascular disease) ([place])    Shortness of breath    Squamous carcinoma    "nose"   Thyroid disease    Hypothyroid   Unstable angina ([place]) 09/25/2015   Past Surgical History:  Procedure Laterality Date   ABDOMINAL AORTIC  ANEURYSM REPAIR     pt denies this hx on 01/02/2016   BASAL CELL CARCINOMA EXCISION     CARDIAC CATHETERIZATION N/A 01/02/2016   Procedure: Left Heart Cath and Coronary Angiography;  Surgeon: Wellington Hampshire, MD;  Location: [place] [facility];  Service: Cardiovascular;  Laterality: N/A;   CARDIAC CATHETERIZATION  1996; 1999   CARDIAC CATHETERIZATION N/A 06/25/2016   Procedure: Left Heart Cath and Cors/Grafts Angiography;  Surgeon: Wellington Hampshire, MD;  Location: [place] [facility];  Service: Cardiovascular;  Laterality: N/A;   CARDIOVERSION N/A 10/19/2019   Procedure: CARDIOVERSION;  Surgeon: Jerline Pain, MD;  Location: [place];  Service: Cardiovascular;  Laterality: N/A;   CARDIOVERSION N/A 04/25/2020   Procedure: CARDIOVERSION;  Surgeon: Jerline Pain, MD;  Location: [facility] ENDOSCOPY;  Service: Cardiovascular;  Laterality: N/A;   CAROTID ENDARTERECTOMY Left 01/02/2011   CATARACT EXTRACTION W/ INTRAOCULAR LENS  IMPLANT, BILATERAL     CLOSED REDUCTION NASAL FRACTURE  11/2007   CORONARY ANGIOPLASTY WITH STENT PLACEMENT  10/10/2011   drug eluting  to rc & saphenous   CORONARY ARTERY BYPASS GRAFT  1999   "CABG X3"   DILATION AND CURETTAGE OF UTERUS     FEMORAL ARTERY STENT Bilateral    FRACTURE SURGERY     LEFT HEART CATHETERIZATION WITH CORONARY/GRAFT ANGIOGRAM N/A 04/22/2011   Procedure: LEFT HEART CATHETERIZATION WITH Beatrix Fetters;  Surgeon: Jolaine Artist, MD;  Location[facility] CATH LAB;  Service: Cardiovascular;  Laterality: N/A;   LEFT HEART CATHETERIZATION WITH CORONARY/GRAFT ANGIOGRAM N/A 10/10/2011   Procedure: LEFT HEART CATHETERIZATION WITH Beatrix Fetters;  Surgeon: Peter M Martinique, MD;  Location: [facility] CATH LAB;  Service: Cardiovascular;  Laterality: N/A;   NASAL SINUS SURGERY     twice   SQUAMOUS CELL CARCINOMA EXCISION     SVT ABLATION N/A 03/19/2018   Procedure: SVT ABLATION;  Surgeon: Constance Haw, MD;  Location: [place] [facility];  Service:  Cardiovascular;  Laterality: N/A;   TUBAL LIGATION       No current outpatient medications on file.   No current facility-administered medications for this visit.    Allergies:   Erythromycin, Tape, Meperidine, Shellfish allergy, Ciprofloxacin, Codeine, Latex, and Penicillins   Social History:  The patient  reports that she quit smoking about 28 years ago. Her smoking use included cigarettes. She started smoking about 65 years ago. She has a 20.00 pack-year smoking history. She has never used smokeless tobacco. She reports current alcohol use. She reports that she does not use drugs.   Family History:  The patient's family history includes Alcohol abuse in her brother; Aneurysm in her brother; Angina in her father; Cirrhosis in her maternal grandfather; Colon cancer (age of onset: 55) in her father; Diabetes in her  maternal grandmother and mother; Heart attack in her brother and brother; Heart attack (age of onset: 50) in her mother; Heart disease in her brother, brother, and father; Hyperlipidemia in her daughter; Hypertension in her brother and father; Hypothyroidism in her sister; Lung cancer in her father and sister; Obesity in her daughter and son; Stroke in her maternal grandmother and paternal grandmother.   ROS:  Please see the history of present illness.   Otherwise, review of systems is positive for none.   All other systems are reviewed and negative.   PHYSICAL EXAM: VS:  There were no vitals taken for this visit. , BMI There is no height or weight on file to calculate BMI. GEN: Well nourished, well developed, in no acute distress  HEENT: normal  Neck: no JVD, carotid bruits, or masses Cardiac: irregular; no murmurs, rubs, or gallops,no edema  Respiratory:  clear to auscultation bilaterally, normal work of breathing GI: soft, nontender, nondistended, + BS MS: no deformity or atrophy  Skin: warm and dry Neuro:  Strength and sensation are intact Psych: euthymic mood, full  affect  EKG:  EKG is ordered today. Personal review of the ekg ordered shows atrial fibrillation, rate 73  Recent Labs: 12/19/2020: TSH 3.740 01/10/2021: ALT 9 04/10/2021: B Natriuretic Peptide 305.1 04/14/2021: Magnesium 2.0 04/24/2021: BUN 17; Creatinine, Ser 0.63; Hemoglobin 10.6; Platelets 154; Potassium 3.8; Sodium 133    Lipid Panel     Component Value Date/Time   CHOL 121 01/10/2021 1427   CHOL 253 (H) 03/07/2016 0930   TRIG 191.0 (H) 01/10/2021 1427   HDL 26.40 (L) 01/10/2021 1427   HDL 32 (L) 03/07/2016 0930   CHOLHDL 5 01/10/2021 1427   VLDL 38.2 01/10/2021 1427   LDLCALC 57 01/10/2021 1427   LDLCALC 183 (H) 03/07/2016 0930   LDLDIRECT 61.0 06/13/2019 1425     Wt Readings from Last 3 Encounters:  04/23/21 120 lb 13 oz (54.8 kg)  04/02/21 124 lb (56.2 kg)  03/21/21 125 lb (56.7 kg)      Other studies Reviewed: Additional studies/ records that were reviewed today include: TTE 08/17/19 Review of the above records today demonstrates:   1. Left ventricular ejection fraction, by estimation, is 60 to 65%. The  left ventricle has normal function. The left ventricle has no regional  wall motion abnormalities. Left ventricular diastolic parameters are  indeterminate.   2. Right ventricular systolic function is moderately reduced. The right  ventricular size is moderately enlarged. moderately increased right  ventricular wall thickness. There is moderately elevated pulmonary artery  systolic pressure. The estimated right  ventricular systolic pressure is 83.4 mmHg.   3. Left atrial size was moderately dilated.   4. Right atrial size was moderately dilated.   5. The mitral valve is degenerative. Mild to moderate mitral valve  regurgitation.   6. Tricuspid valve regurgitation is severe.   7. The aortic valve is tricuspid. Aortic valve regurgitation is not  visualized. Mild to moderate aortic valve sclerosis/calcification is  present, without any evidence of aortic  stenosis.   8. The inferior vena cava is dilated in size with <50% respiratory  variability, suggesting right atrial pressure of 15 mmHg.   Holter 01/10/18 - personally reviewed Normal sinus rhythm Rare episodes of SVT/atrial tachycardia, 130 runs longest run 14 beats with rate 138 bpm Frequent PACs, 7%   Rare PVCs, 1% Rare PVCs and bigemeny  ASSESSMENT AND PLAN:  1.  SVT: EP study without inducible arrhythmia.  Currently on diltiazem.***  2.  Hypertension:***  3.  Coronary artery disease status post CABG.  No current chest pain.  Continue Plavix.  4.  Persistent atrial fibrillation: Currently on Xarelto and diltiazem.  CHA2DS2-VASc of 5.   Current medicines are reviewed at length with the patient today.   The patient does not have concerns regarding her medicines.  The following changes were made today: ***  Labs/ tests ordered today include:  No orders of the defined types were placed in this encounter.    Disposition:   FU with Floria Brandau *** months  Signed, Christyl Osentoski Meredith Leeds, MD  06/20/2021 10:53 AM     [facility] [address] [place]  [address] [phone] (office) [phone] (fax)

## 2021-07-11 ENCOUNTER — Ambulatory Visit: Payer: Medicare HMO | Admitting: Family Medicine

## 2021-07-15 ENCOUNTER — Ambulatory Visit: Payer: Medicare HMO | Admitting: Family Medicine

## 2022-01-20 ENCOUNTER — Encounter: Payer: Self-pay | Admitting: Family Medicine

## 2022-11-13 IMAGING — MG MM DIGITAL SCREENING BILAT W/ TOMO AND CAD
8 series · 8 of 24 positions shown · non-contrast
Comparison: Previous exam(s).

CLINICAL DATA: Screening.

EXAM:
DIGITAL SCREENING BILATERAL MAMMOGRAM WITH TOMOSYNTHESIS AND CAD
TECHNIQUE: Bilateral screening digital craniocaudal and mediolateral oblique
mammograms were obtained. Bilateral screening digital breast
tomosynthesis was performed. The images were evaluated with
computer-aided detection.

[L MLO synth-2D]
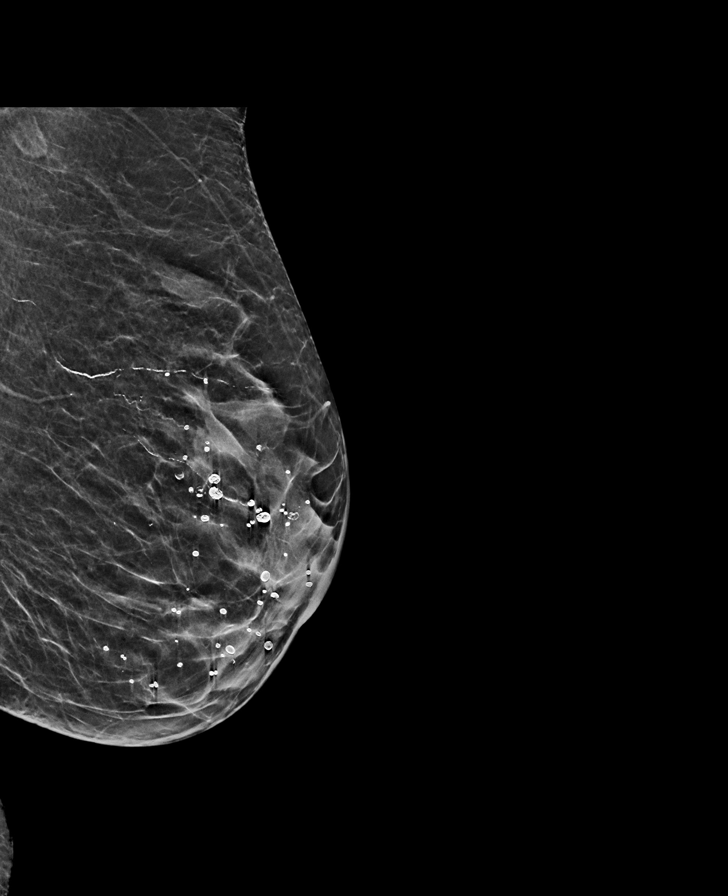

[R CC synth-2D]
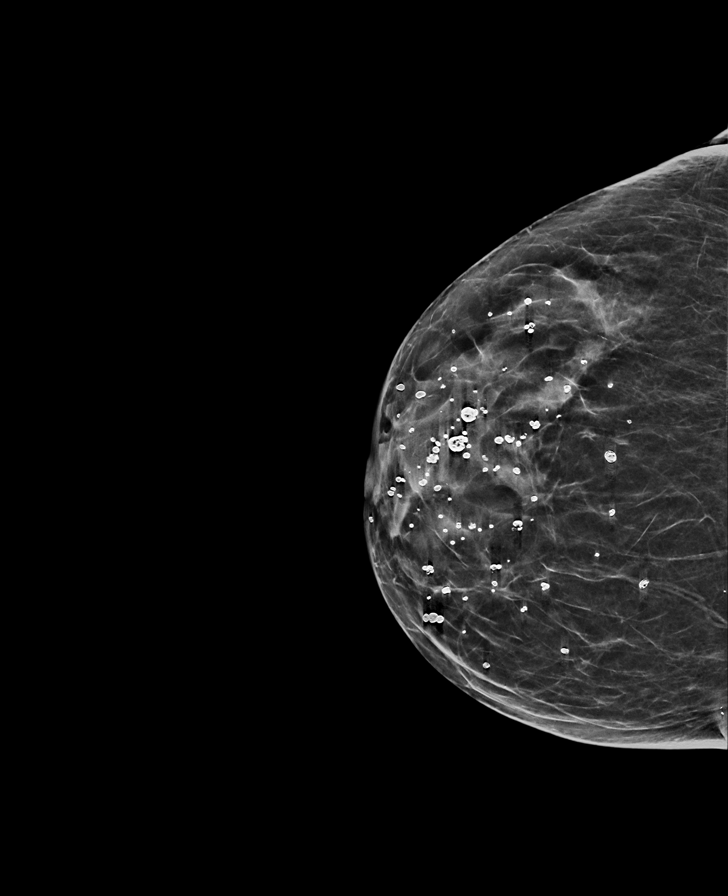

[R MLO synth-2D]
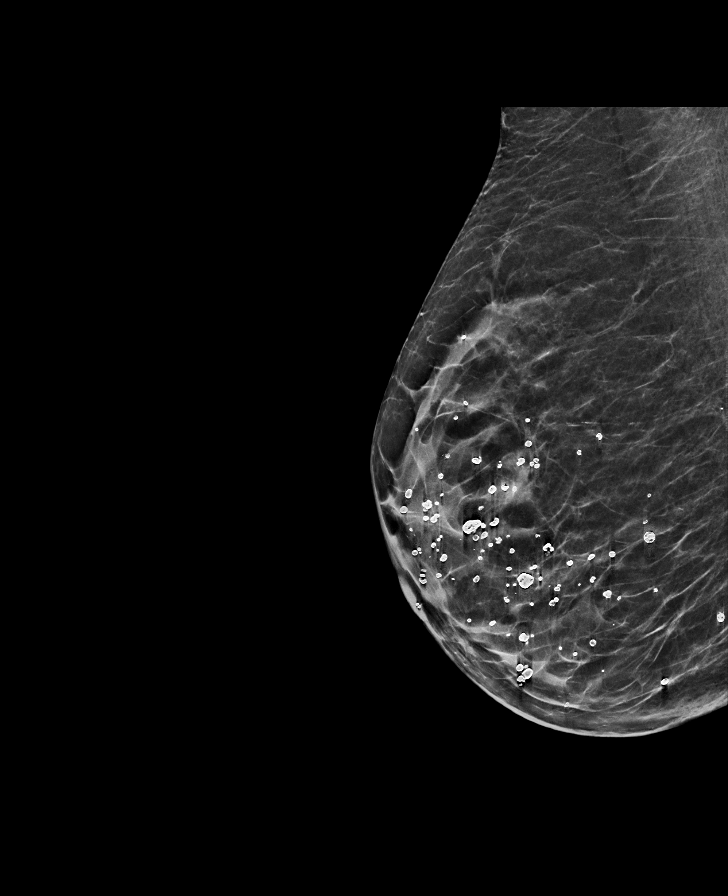

[L CC synth-2D]
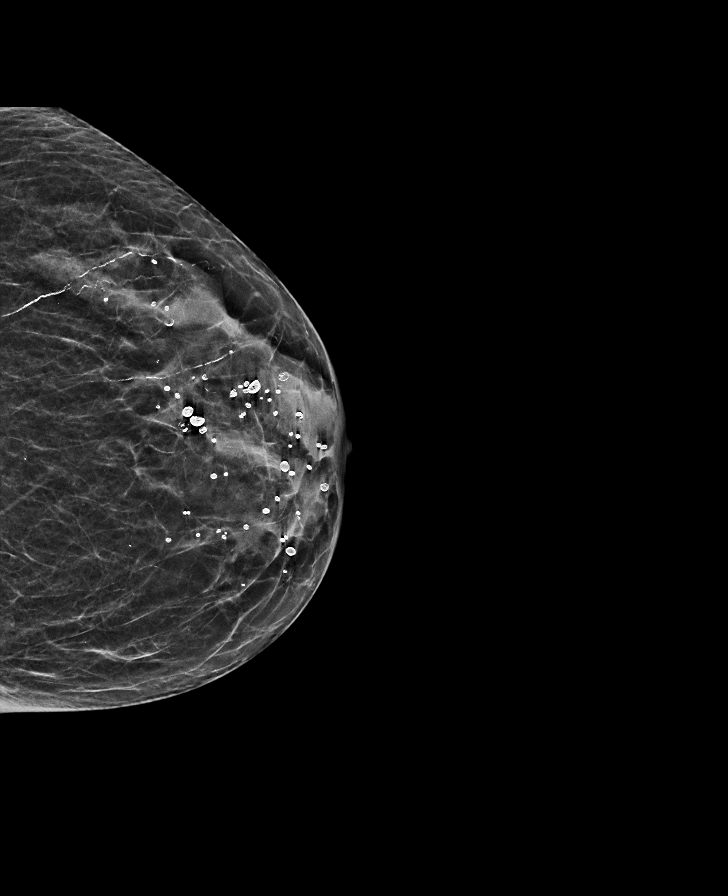

[L CC tomo · tomo slice 22/43.0]
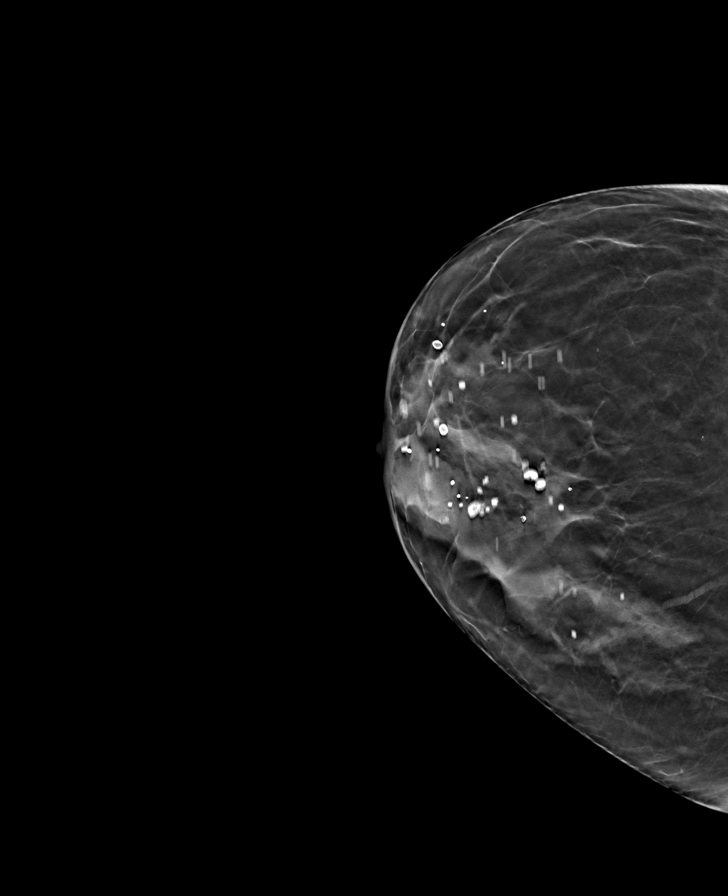

[L MLO tomo · tomo slice 23/46.0]
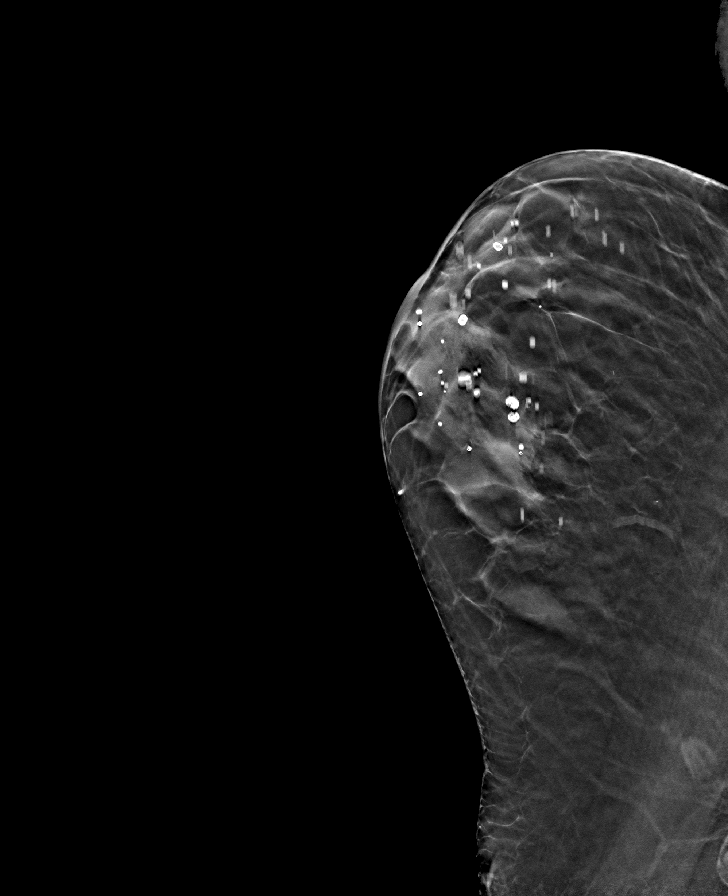

[R CC tomo · tomo slice 25/50.0]
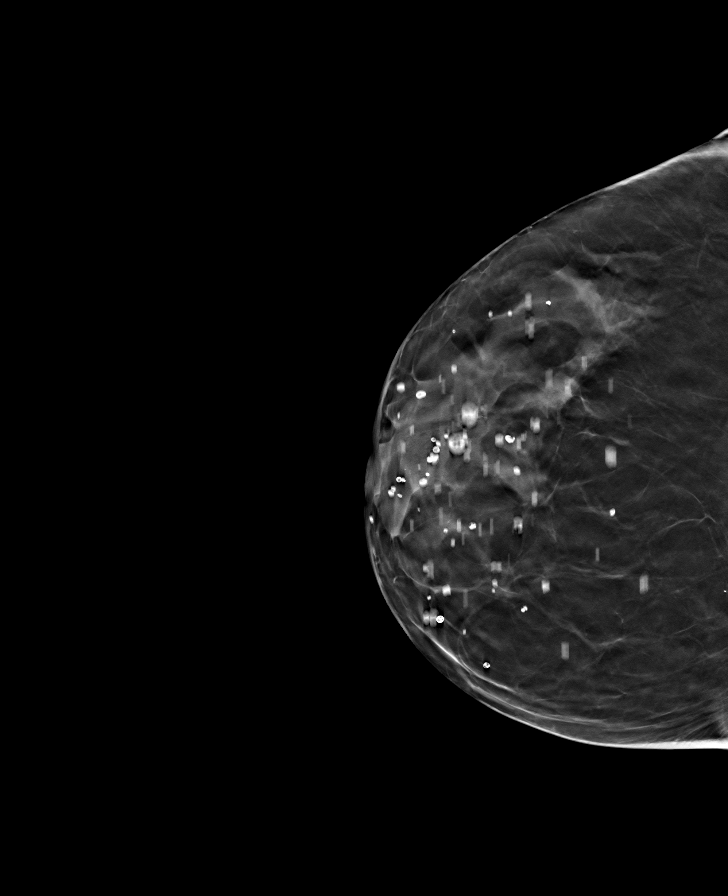

[R MLO tomo · tomo slice 25/50.0]
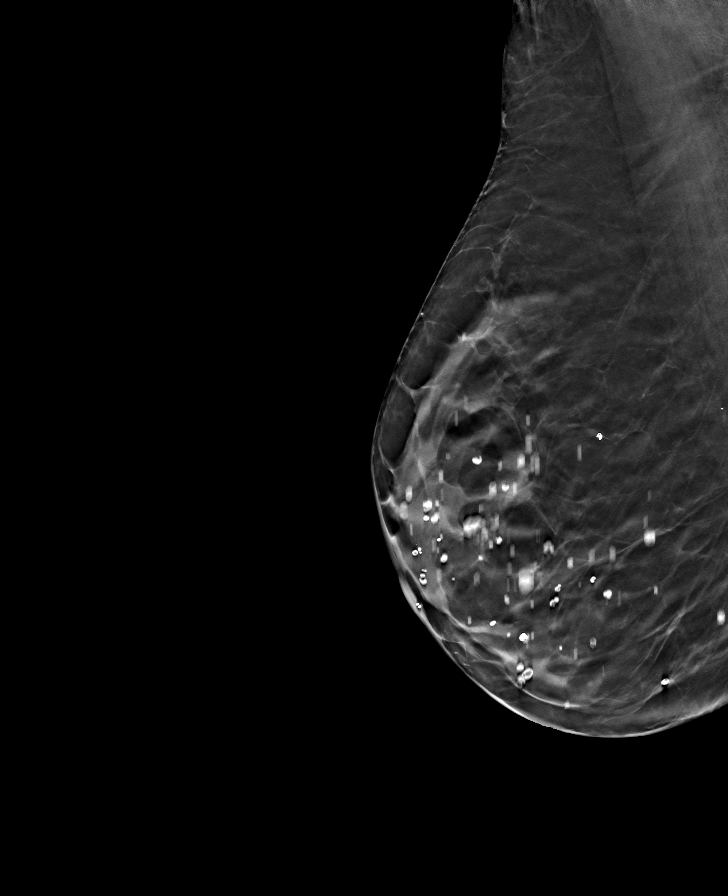

[8 of 24 positions shown; findings below may reference images not displayed]

ACR Breast Density Category c: The breast tissue is heterogeneously
dense, which may obscure small masses.
FINDINGS: There are no findings suspicious for malignancy.
IMPRESSION: No mammographic evidence of malignancy. A result letter of this
screening mammogram will be mailed directly to the patient.

RECOMMENDATION:
Screening mammogram in one year. (Code:Q3-W-BC3)

BI-RADS CATEGORY  1: Negative.
